# Patient Record
Sex: Male | Born: 1965 | Race: Black or African American | Hispanic: No | Smoking: Never smoker
Health system: Southern US, Community
[De-identification: ages and names within clinical notes are randomized; demographics above are authoritative.]

## PROBLEM LIST (undated history)

## (undated) DIAGNOSIS — M109 Gout, unspecified: Secondary | ICD-10-CM

## (undated) DIAGNOSIS — M51369 Other intervertebral disc degeneration, lumbar region without mention of lumbar back pain or lower extremity pain: Secondary | ICD-10-CM

## (undated) DIAGNOSIS — M5136 Other intervertebral disc degeneration, lumbar region: Secondary | ICD-10-CM

## (undated) DIAGNOSIS — D75A Glucose-6-phosphate dehydrogenase (G6PD) deficiency without anemia: Secondary | ICD-10-CM

## (undated) DIAGNOSIS — I10 Essential (primary) hypertension: Secondary | ICD-10-CM

## (undated) DIAGNOSIS — Z862 Personal history of diseases of the blood and blood-forming organs and certain disorders involving the immune mechanism: Secondary | ICD-10-CM

## (undated) HISTORY — PX: MENISCUS REPAIR: SHX5179

## (undated) HISTORY — PX: TENDON REPAIR: SHX5111

---
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
## 2015-04-02 ENCOUNTER — Other Ambulatory Visit: Payer: Self-pay | Admitting: Sports Medicine

## 2015-04-02 DIAGNOSIS — M1990 Unspecified osteoarthritis, unspecified site: Secondary | ICD-10-CM

## 2015-04-02 DIAGNOSIS — M545 Low back pain: Secondary | ICD-10-CM

## 2015-04-15 ENCOUNTER — Ambulatory Visit
Admission: RE | Admit: 2015-04-15 | Discharge: 2015-04-15 | Disposition: A | Discharge status: Home / Self Care | Source: Ambulatory Visit | Attending: Sports Medicine | Admitting: Sports Medicine

## 2015-04-15 DIAGNOSIS — M545 Low back pain: Secondary | ICD-10-CM

## 2015-04-15 DIAGNOSIS — M1990 Unspecified osteoarthritis, unspecified site: Secondary | ICD-10-CM

## 2018-03-22 ENCOUNTER — Emergency Department (HOSPITAL_COMMUNITY)

## 2018-03-22 ENCOUNTER — Encounter (HOSPITAL_COMMUNITY): Payer: Self-pay | Admitting: *Deleted

## 2018-03-22 ENCOUNTER — Inpatient Hospital Stay (HOSPITAL_COMMUNITY)

## 2018-03-22 ENCOUNTER — Inpatient Hospital Stay (HOSPITAL_COMMUNITY)
Admission: EM | Admit: 2018-03-22 | Discharge: 2018-04-11 | DRG: 028 | Disposition: A | Discharge status: Rehabilitation Facility | Attending: Neurosurgery | Admitting: Neurosurgery

## 2018-03-22 DIAGNOSIS — R58 Hemorrhage, not elsewhere classified: Secondary | ICD-10-CM

## 2018-03-22 DIAGNOSIS — G959 Disease of spinal cord, unspecified: Secondary | ICD-10-CM | POA: Diagnosis not present

## 2018-03-22 DIAGNOSIS — L8990 Pressure ulcer of unspecified site, unspecified stage: Secondary | ICD-10-CM

## 2018-03-22 DIAGNOSIS — D696 Thrombocytopenia, unspecified: Secondary | ICD-10-CM | POA: Diagnosis not present

## 2018-03-22 DIAGNOSIS — Z7901 Long term (current) use of anticoagulants: Secondary | ICD-10-CM | POA: Diagnosis not present

## 2018-03-22 DIAGNOSIS — I10 Essential (primary) hypertension: Secondary | ICD-10-CM | POA: Diagnosis present

## 2018-03-22 DIAGNOSIS — Y839 Surgical procedure, unspecified as the cause of abnormal reaction of the patient, or of later complication, without mention of misadventure at the time of the procedure: Secondary | ICD-10-CM | POA: Diagnosis not present

## 2018-03-22 DIAGNOSIS — L89159 Pressure ulcer of sacral region, unspecified stage: Secondary | ICD-10-CM | POA: Diagnosis not present

## 2018-03-22 DIAGNOSIS — R0789 Other chest pain: Secondary | ICD-10-CM

## 2018-03-22 DIAGNOSIS — I82409 Acute embolism and thrombosis of unspecified deep veins of unspecified lower extremity: Secondary | ICD-10-CM | POA: Diagnosis not present

## 2018-03-22 DIAGNOSIS — G8191 Hemiplegia, unspecified affecting right dominant side: Secondary | ICD-10-CM | POA: Diagnosis present

## 2018-03-22 DIAGNOSIS — T45515A Adverse effect of anticoagulants, initial encounter: Secondary | ICD-10-CM | POA: Diagnosis not present

## 2018-03-22 DIAGNOSIS — I824Y3 Acute embolism and thrombosis of unspecified deep veins of proximal lower extremity, bilateral: Secondary | ICD-10-CM | POA: Diagnosis not present

## 2018-03-22 DIAGNOSIS — Z886 Allergy status to analgesic agent status: Secondary | ICD-10-CM

## 2018-03-22 DIAGNOSIS — I2601 Septic pulmonary embolism with acute cor pulmonale: Secondary | ICD-10-CM | POA: Diagnosis not present

## 2018-03-22 DIAGNOSIS — E46 Unspecified protein-calorie malnutrition: Secondary | ICD-10-CM | POA: Diagnosis not present

## 2018-03-22 DIAGNOSIS — I824Z3 Acute embolism and thrombosis of unspecified deep veins of distal lower extremity, bilateral: Secondary | ICD-10-CM | POA: Diagnosis not present

## 2018-03-22 DIAGNOSIS — Z419 Encounter for procedure for purposes other than remedying health state, unspecified: Secondary | ICD-10-CM

## 2018-03-22 DIAGNOSIS — M488X2 Other specified spondylopathies, cervical region: Secondary | ICD-10-CM | POA: Diagnosis present

## 2018-03-22 DIAGNOSIS — M4802 Spinal stenosis, cervical region: Secondary | ICD-10-CM | POA: Diagnosis present

## 2018-03-22 DIAGNOSIS — Z01810 Encounter for preprocedural cardiovascular examination: Secondary | ICD-10-CM

## 2018-03-22 DIAGNOSIS — M79609 Pain in unspecified limb: Secondary | ICD-10-CM | POA: Diagnosis not present

## 2018-03-22 DIAGNOSIS — M5412 Radiculopathy, cervical region: Secondary | ICD-10-CM

## 2018-03-22 DIAGNOSIS — I82403 Acute embolism and thrombosis of unspecified deep veins of lower extremity, bilateral: Secondary | ICD-10-CM | POA: Diagnosis not present

## 2018-03-22 DIAGNOSIS — R0902 Hypoxemia: Secondary | ICD-10-CM | POA: Diagnosis present

## 2018-03-22 DIAGNOSIS — M5136 Other intervertebral disc degeneration, lumbar region: Secondary | ICD-10-CM | POA: Diagnosis present

## 2018-03-22 DIAGNOSIS — R531 Weakness: Secondary | ICD-10-CM | POA: Diagnosis present

## 2018-03-22 DIAGNOSIS — G8389 Other specified paralytic syndromes: Secondary | ICD-10-CM | POA: Diagnosis not present

## 2018-03-22 DIAGNOSIS — M7989 Other specified soft tissue disorders: Secondary | ICD-10-CM | POA: Diagnosis not present

## 2018-03-22 DIAGNOSIS — W19XXXA Unspecified fall, initial encounter: Secondary | ICD-10-CM | POA: Diagnosis present

## 2018-03-22 DIAGNOSIS — R195 Other fecal abnormalities: Secondary | ICD-10-CM | POA: Diagnosis not present

## 2018-03-22 DIAGNOSIS — K5900 Constipation, unspecified: Secondary | ICD-10-CM | POA: Diagnosis present

## 2018-03-22 DIAGNOSIS — M5416 Radiculopathy, lumbar region: Secondary | ICD-10-CM

## 2018-03-22 DIAGNOSIS — E561 Deficiency of vitamin K: Secondary | ICD-10-CM

## 2018-03-22 DIAGNOSIS — I82443 Acute embolism and thrombosis of tibial vein, bilateral: Secondary | ICD-10-CM | POA: Diagnosis not present

## 2018-03-22 DIAGNOSIS — M25561 Pain in right knee: Secondary | ICD-10-CM | POA: Diagnosis not present

## 2018-03-22 DIAGNOSIS — G8929 Other chronic pain: Secondary | ICD-10-CM | POA: Diagnosis present

## 2018-03-22 DIAGNOSIS — K592 Neurogenic bowel, not elsewhere classified: Secondary | ICD-10-CM | POA: Diagnosis not present

## 2018-03-22 DIAGNOSIS — I2699 Other pulmonary embolism without acute cor pulmonale: Secondary | ICD-10-CM

## 2018-03-22 DIAGNOSIS — F410 Panic disorder [episodic paroxysmal anxiety] without agoraphobia: Secondary | ICD-10-CM | POA: Diagnosis not present

## 2018-03-22 DIAGNOSIS — Z882 Allergy status to sulfonamides status: Secondary | ICD-10-CM

## 2018-03-22 DIAGNOSIS — G9761 Postprocedural hematoma of a nervous system organ or structure following a nervous system procedure: Secondary | ICD-10-CM | POA: Diagnosis not present

## 2018-03-22 DIAGNOSIS — M10072 Idiopathic gout, left ankle and foot: Secondary | ICD-10-CM | POA: Diagnosis not present

## 2018-03-22 DIAGNOSIS — Z6841 Body Mass Index (BMI) 40.0 and over, adult: Secondary | ICD-10-CM | POA: Diagnosis not present

## 2018-03-22 DIAGNOSIS — I2602 Saddle embolus of pulmonary artery with acute cor pulmonale: Secondary | ICD-10-CM | POA: Diagnosis not present

## 2018-03-22 DIAGNOSIS — R0602 Shortness of breath: Secondary | ICD-10-CM

## 2018-03-22 DIAGNOSIS — F4310 Post-traumatic stress disorder, unspecified: Secondary | ICD-10-CM | POA: Diagnosis not present

## 2018-03-22 DIAGNOSIS — I82401 Acute embolism and thrombosis of unspecified deep veins of right lower extremity: Secondary | ICD-10-CM | POA: Diagnosis not present

## 2018-03-22 DIAGNOSIS — R601 Generalized edema: Secondary | ICD-10-CM | POA: Diagnosis not present

## 2018-03-22 DIAGNOSIS — M1711 Unilateral primary osteoarthritis, right knee: Secondary | ICD-10-CM | POA: Diagnosis not present

## 2018-03-22 DIAGNOSIS — I9789 Other postprocedural complications and disorders of the circulatory system, not elsewhere classified: Secondary | ICD-10-CM | POA: Diagnosis not present

## 2018-03-22 DIAGNOSIS — G479 Sleep disorder, unspecified: Secondary | ICD-10-CM | POA: Diagnosis not present

## 2018-03-22 DIAGNOSIS — L7622 Postprocedural hemorrhage and hematoma of skin and subcutaneous tissue following other procedure: Secondary | ICD-10-CM | POA: Diagnosis not present

## 2018-03-22 DIAGNOSIS — S14123A Central cord syndrome at C3 level of cervical spinal cord, initial encounter: Secondary | ICD-10-CM | POA: Diagnosis present

## 2018-03-22 DIAGNOSIS — D62 Acute posthemorrhagic anemia: Secondary | ICD-10-CM | POA: Diagnosis not present

## 2018-03-22 HISTORY — DX: Other intervertebral disc degeneration, lumbar region without mention of lumbar back pain or lower extremity pain: M51.369

## 2018-03-22 HISTORY — DX: Gout, unspecified: M10.9

## 2018-03-22 HISTORY — DX: Personal history of diseases of the blood and blood-forming organs and certain disorders involving the immune mechanism: Z86.2

## 2018-03-22 HISTORY — DX: Other intervertebral disc degeneration, lumbar region: M51.36

## 2018-03-22 HISTORY — DX: Essential (primary) hypertension: I10

## 2018-03-22 LAB — BASIC METABOLIC PANEL
Anion gap: 11 (ref 5–15)
BUN: 10 mg/dL (ref 6–20)
CHLORIDE: 106 mmol/L (ref 98–111)
CO2: 24 mmol/L (ref 22–32)
Calcium: 9.2 mg/dL (ref 8.9–10.3)
Creatinine, Ser: 1.08 mg/dL (ref 0.61–1.24)
GFR calc Af Amer: >60 mL/min (ref >60)
GFR calc non Af Amer: >60 mL/min (ref >60)
GLUCOSE: 84 mg/dL (ref 70–99)
POTASSIUM: 3.7 mmol/L (ref 3.5–5.1)
Sodium: 141 mmol/L (ref 135–145)

## 2018-03-22 LAB — CBC
HCT: 43.9 % (ref 39.0–52.0)
Hemoglobin: 13.8 g/dL (ref 13.0–17.0)
MCH: 28.6 pg (ref 26.0–34.0)
MCHC: 31.4 g/dL (ref 30.0–36.0)
MCV: 91.1 fL (ref 78.0–100.0)
PLATELETS: 173 10*3/uL (ref 150–400)
RBC: 4.82 MIL/uL (ref 4.22–5.81)
RDW: 12.4 % (ref 11.5–15.5)
WBC: 6.4 10*3/uL (ref 4.0–10.5)

## 2018-03-22 MED ORDER — BACLOFEN 10 MG PO TABS
10.0000 mg | ORAL_TABLET | Freq: Two times a day (BID) | ORAL | Status: DC
  Administered 2018-03-22 – 2018-04-11 (×37): 10 mg via ORAL
  Filled 2018-03-22 (×39): qty 1

## 2018-03-22 MED ORDER — LORAZEPAM 2 MG/ML IJ SOLN
1.0000 mg | Freq: Once | INTRAMUSCULAR | Status: AC
  Administered 2018-03-22: 1 mg via INTRAVENOUS
  Filled 2018-03-22: qty 1

## 2018-03-22 MED ORDER — SODIUM CHLORIDE 0.9 % IV SOLN
Freq: Once | INTRAVENOUS | Status: AC
  Administered 2018-03-22: 20:00:00 via INTRAVENOUS

## 2018-03-22 MED ORDER — HYDROMORPHONE HCL 1 MG/ML IJ SOLN: 1.0000 mg | Freq: Once | INTRAMUSCULAR | Status: DC

## 2018-03-22 MED ORDER — DEXAMETHASONE SODIUM PHOSPHATE 10 MG/ML IJ SOLN
10.0000 mg | Freq: Once | INTRAMUSCULAR | Status: AC
  Administered 2018-03-22: 10 mg via INTRAVENOUS
  Filled 2018-03-22: qty 1

## 2018-03-22 NOTE — ED Notes (Signed)
Remains in MRI 

## 2018-03-22 NOTE — ED Notes (Signed)
PT in MRI.  Received call that pt needs Ativan.  Huntley DecSara, RN going to MRI for IV and Ativan administration.

## 2018-03-22 NOTE — ED Provider Notes (Signed)
[facility] EMERGENCY DEPARTMENT Provider Note   CSN: [id number] Arrival date & time: 03/22/18  1100     History   Chief Complaint Chief Complaint  Patient presents with  . Fall    HPI [name] [name] is a 52 y.o. male.  HPI Patient presents with weakness in his right upper extremity and weakness in his right lower leg.  States he has had chronic degenerative disease in his back.  Is been going for years and is seen Dr. Eulah Pont previously.  States he has had some chronic weakness in the right leg but recently is gotten worse.  Has been worse over the last couple weeks.  States he had a fall today where he could not get up.  Also for the last couple weeks he has had some weakness in his right arm.  States initially it was some paresthesias but now he has more difficulty moving his right hand.  States he fell today when his knee gave out and he was having difficulty getting up.  States he had to crawl.  States he bumped his front of his head.  States now when he bends his head over he gets tingling down both of his legs.  No neck pain. Past Medical History:  Diagnosis Date  . Degenerative disc disease, lumbar   . Gout   . Hypertension     There are no active problems to display for this patient.   Past Surgical History:  Procedure Laterality Date  . MENISCUS REPAIR Left   . TENDON REPAIR     tricept tendon        Home Medications    Prior to Admission medications   Medication Sig Start Date End Date Taking? Authorizing Provider  naproxen sodium (ALEVE) 220 MG tablet Take 220 mg by mouth 2 (two) times daily as needed (pain).   Yes [provider]    Family History No family history on file.  Social History Social History   Tobacco Use  . Smoking status: Never Smoker  . Smokeless tobacco: Never Used  Substance Use Topics  . Alcohol use: Never    Frequency: Never  . Drug use: Never     Allergies   Nsaids and Sulfa  antibiotics   Review of Systems Review of Systems  Constitutional: Negative for appetite change.  HENT: Negative for congestion.   Respiratory: Negative for shortness of breath.   Cardiovascular: Negative for chest pain and palpitations.  Gastrointestinal: Negative for abdominal pain.  Genitourinary: Negative for flank pain.  Musculoskeletal: Positive for back pain. Negative for neck pain.  Skin: Negative for rash.  Neurological: Positive for weakness and numbness.  Hematological: Negative for adenopathy.     Physical Exam Updated Vital Signs BP (!) 142/92   Pulse (!) 58   Temp 98.3 F (36.8 C) (Oral)   Resp 18   Wt (!) 138.3 kg (305 lb)   SpO2 97%   Physical Exam  Constitutional: He appears well-developed.  HENT:  Head: Atraumatic.  Eyes: EOM are normal.  Neck: Neck supple.  Cardiovascular: Normal rate.  Pulmonary/Chest: Effort normal.  Abdominal: Soft.  Musculoskeletal: He exhibits no tenderness.  Neurological: He is alert.  Patient with decreased strength and movement in his right hand.  Mild paresthesias.  Decreased dorsiflexion of right foot.  Decreased straight leg raise on the right side.  No real lumbar tenderness.  Skin: Skin is warm.  Psychiatric: He has a normal mood and affect.  ED Treatments / Results  Labs (all labs ordered are listed, but only abnormal results are displayed) Labs Reviewed  CBC  BASIC METABOLIC PANEL    EKG None  Radiology Ct Head Wo Contrast  Result Date: 03/22/2018 CLINICAL DATA:  Right-sided numbness and tingling, recent fall EXAM: CT HEAD WITHOUT CONTRAST TECHNIQUE: Contiguous axial images were obtained from the base of the skull through the vertex without intravenous contrast. COMPARISON:  None. FINDINGS: Brain: The ventricular system is prominent, disproportionate to the level of cortical atrophy. Therefore, normal pressure hydrocephalus would be a consideration if appropriate clinically. The fourth ventricle and  basilar cisterns are unremarkable. No hemorrhage, mass lesion, or acute infarction is seen. Vascular: No vascular abnormality is noted on this unenhanced study. Skull: On bone window images, no calvarial abnormality is seen. Sinuses/Orbits: There is retention cyst and possibly small amount of fluid in the right maxillary sinus consistent with right maxillary sinus disease. Mild mucosal thickening is present medially in the left maxillary sinus. The remainder of paranasal sinuses are well pneumatized. Other: None. IMPRESSION: 1. Ventriculomegaly, disproportionate to the degree of atrophy. Consider the possibility of normal pressure hydrocephalus. 2. No acute intracranial abnormality. 3. Right maxillary sinus disease. Electronically Signed   By: Dwyane DeePaul  Barry M.D.   On: 03/22/2018 14:34    Procedures Procedures (including critical care time)  Medications Ordered in ED Medications  LORazepam (ATIVAN) injection 1 mg (has no administration in time range)     Initial Impression / Assessment and Plan / ED Course  I have reviewed the triage vital signs and the nursing notes.  Pertinent labs & imaging results that were available during my care of the patient were reviewed by me and considered in my medical decision making (see chart for details).     Patient with right leg weakness and back pain.  When he bends his head forward he gets paresthesias down both his legs.  Also has new weakness in the right upper extremity.  Nonspecific CT with possible mild hydrocephalus.  Will get MRI of cervical and lumbar spine. Care turned over to Dr Erma HeritageIsaacs.  Final Clinical Impressions(s) / ED Diagnoses   Final diagnoses:  Lumbar radiculopathy  Cervical radiculopathy    ED Discharge Orders    None       Benjiman CorePickering, Roye Gustafson, MD 03/22/18 1704

## 2018-03-22 NOTE — ED Notes (Signed)
Pt in MRI. Delay in bloodwork

## 2018-03-22 NOTE — ED Provider Notes (Addendum)
Assumed care from Dr. Rubin PayorPickering at 4:08 PM. Briefly, the patient is a 52 y.o. male with PMHx of  has a past medical history of Degenerative disc disease, lumbar, Gout, and Hypertension. here with RUE weakness, RLE weakness, neck pain..   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  Patient started on IV Decadron and neurosurgery paged.  On my exam, he has some weakness with elbow extension, grip strength, and wrist extension on the right. -Discussed with Dr. Wynetta Emeryram, will start IV Decadron.  Patient placed in an Aspen collar.  Spine precautions.   CRITICAL CARE Performed by: Dollene Clevelandameron Isascs   Total critical care time: 35 minutes  Critical care time was exclusive of separately billable procedures and treating other patients.  Critical care was necessary to treat or prevent imminent or life-threatening deterioration.  Critical care was time spent personally by me on the following activities: development of treatment plan with patient and/or surrogate as well as nursing, discussions with consultants, evaluation of patient's response to treatment, examination of patient, obtaining history from patient or surrogate, ordering and performing treatments and interventions, ordering and review of laboratory studies, ordering and review of radiographic studies, pulse oximetry and re-evaluation of patient's condition.    Shaune PollackIsaacs, Kahle Mcqueen, MD 03/22/18 Prentice [id number]    Shaune PollackIsaacs, Jaliah Foody, MD 03/22/18 1919

## 2018-03-22 NOTE — ED Notes (Signed)
Remains in MRI.  Wife and son in room.  Updated on plan of care.

## 2018-03-22 NOTE — ED Triage Notes (Signed)
Pt states that his right knee "gave out" and he was unable to get up off the floor.  Pt with hx of DDD which causes right sided numbness and tingling (since 2016).  Pt reports that he fell on Monday.  Pt is alert and oriented.  He reports that he did not injure himself when his knee gave out, he reports that he "slowly went down" but could not get himself.  Pt was able to ambulate with EMS once he was helped up.  Pt has a skinned left elbow from the carpet when he was trying to get himself up. Pt denies any previous issues with right knee, denies any pain in knee, he states that it feels 'weak"

## 2018-03-22 NOTE — ED Notes (Signed)
Patient transported to MRI 

## 2018-03-22 NOTE — H&P (Signed)
[name] [name] is an 52 y.o. male.   Chief Complaint: numbness, weakness HPI:  52 year old gentleman with long-standing history with his low back all by orthopedics with chronic low back pain undergone injections and manage that recently well. He's had several weeks of balance difficulty and numbness in his hand primarily on the right also he's had episodic Dragging his right leg is also been going on for several weeks. He had a fall on Monday where he fell and struck his head and neck and since then his next appearance a little bit increased numbness in his right upper extremity and also his right leg. He had another fall on a wet surface today and had some difficulty with lower Sherry strength pulling them up and getting him back in his feet; EMS was brought to the ER. Overall the patient doesn't feel like he is any weaker in his right side and bent last few weeks he doesn't reports little bit increased numbness. He's had some chronic problems with constipation scuttlebutt worse over last few weeks he also reports a history that may be very chronic with possible urinary retention doubly has frequency and urgency. He is a long-standing chronic neck pain and he definite gives a positive history of Lhermitte's whenever he extends his neck he gets shocking feeling down his back and legs.  Past Medical History:  Diagnosis Date  . Degenerative disc disease, lumbar   . Gout   . Hypertension     Past Surgical History:  Procedure Laterality Date  . MENISCUS REPAIR Left   . TENDON REPAIR     tricept tendon    No family history on file. Social History:  reports that he has never smoked. He has never used smokeless tobacco. He reports that he does not drink alcohol or use drugs.  Allergies:  Allergies  Allergen Reactions  . Nsaids Other (See Comments)    G6PD  . Sulfa Antibiotics Other (See Comments)    G6PD     (Not in a hospital admission)  No results found for this or any previous visit  (from the past 48 hour(s)). Ct Head Wo Contrast  Result Date: 03/22/2018 CLINICAL DATA:  Right-sided numbness and tingling, recent fall EXAM: CT HEAD WITHOUT CONTRAST TECHNIQUE: Contiguous axial images were obtained from the base of the skull through the vertex without intravenous contrast. COMPARISON:  None. FINDINGS: Brain: The ventricular system is prominent, disproportionate to the level of cortical atrophy. Therefore, normal pressure hydrocephalus would be a consideration if appropriate clinically. The fourth ventricle and basilar cisterns are unremarkable. No hemorrhage, mass lesion, or acute infarction is seen. Vascular: No vascular abnormality is noted on this unenhanced study. Skull: On bone window images, no calvarial abnormality is seen. Sinuses/Orbits: There is retention cyst and possibly small amount of fluid in the right maxillary sinus consistent with right maxillary sinus disease. Mild mucosal thickening is present medially in the left maxillary sinus. The remainder of paranasal sinuses are well pneumatized. Other: None. IMPRESSION: 1. Ventriculomegaly, disproportionate to the degree of atrophy. Consider the possibility of normal pressure hydrocephalus. 2. No acute intracranial abnormality. 3. Right maxillary sinus disease. Electronically Signed   By: Dwyane Dee M.D.   On: 03/22/2018 14:34   Mr Cervical Spine Wo Contrast  Result Date: 03/22/2018 CLINICAL DATA:  Initial evaluation for acute right upper and lower extremity weakness, fall. EXAM: MRI CERVICAL AND LUMBAR SPINE WITHOUT CONTRAST TECHNIQUE: Multiplanar and multiecho pulse sequences of the cervical spine, to include the craniocervical junction and  cervicothoracic junction, and lumbar spine, were obtained without intravenous contrast. COMPARISON:  None. FINDINGS: MRI CERVICAL SPINE FINDINGS Alignment: Straightening with slight reversal of the normal cervical lordosis. No listhesis. Vertebrae: Vertebral body heights are maintained  without evidence for acute or chronic fracture. No definite fracture seen within the cervical spine status post recent trauma. Bone marrow signal intensity diffusely decreased on T1 weighted imaging, suspected to be related to body habitus. No discrete or worrisome osseous lesions. Mild reactive edema about the C3-4 interspace favored to be degenerative in nature. Cord: Linear T1/T2 hypointensity within the left ventral epidural space extending from C3 through C5 favored to reflect ossification of the posterior longitudinal ligament. Resultant severe spinal stenosis at C3-4 through C5-6. Abnormal T2 signal within the cervical spinal cord extending from C3 through C6 consistent with cord contusion/edema. Changes more pronounced at the right aspect of the cord. Possible small focus of associated hemorrhage at the level of C5-6 (series 6, image 8). Posterior Fossa, vertebral arteries, paraspinal tissues: Visualized brain and posterior fossa within normal limits. Craniocervical junction normal. Paraspinous and prevertebral soft tissues within normal limits. No findings to suggest acute ligamentous injury. Normal intravascular flow voids present within the vertebral arteries bilaterally. Disc levels: Underlying congenital spinal stenosis. C2-C3: Left paracentral disc protrusion indents the ventral thecal sac, flattening the left hemi cord. No cord signal changes. Moderate left-sided spinal stenosis. Neural foramina remain patent. C3-C4: Left paracentral osseous spur/ossification of the posterior longitudinal ligament. Underlying mild diffuse disc bulge with bilateral uncovertebral hypertrophy. Resultant severe spinal stenosis with spinal cord compression, most notable on the left. Thecal sac measures 3 mm in AP diameter at its most narrow point. Abnormal cord edema. Mild right with moderate left C4 foraminal stenosis. C4-C5: Left paracentral osseous spurs/ossification of the posterior longitudinal ligament. Underlying  mild diffuse disc bulge with uncovertebral hypertrophy. Severe spinal stenosis with compression of the cervical spinal cord. Abnormal cord signal intensity. Moderate left C5 foraminal stenosis. C5-C6: Diffuse disc bulge with bilateral uncovertebral hypertrophy. Bulging disc slightly asymmetric to the left. Resultant mild spinal stenosis with bilateral foraminal narrowing. C6-C7: Mild disc bulge with uncovertebral hypertrophy. No significant stenosis. C7-T1:  Unremarkable. Visualized upper thoracic spine demonstrates no significant finding. MRI LUMBAR SPINE FINDINGS Segmentation: Normal segmentation. Lowest well-formed disc labeled the L5-S1 level. Alignment: Mild scoliosis. Vertebral bodies otherwise normally aligned with preservation of the normal lumbar lordosis. No listhesis. Vertebrae: Vertebral body heights maintained without evidence for acute or chronic fracture. Underlying bone marrow signal intensity diffusely decreased on T1 weighted imaging, suspected to be related to body habitus. No discrete or worrisome osseous lesions. Reactive endplate changes with associated Schmorl's nodes present about the L4-5 and L5-S1 interspaces. Conus medullaris and cauda equina: Conus extends to the L2 level. Conus and cauda equina appear normal. Paraspinal and other soft tissues: Paraspinous soft tissues demonstrate no acute finding. Visualized visceral structures grossly unremarkable. Disc levels: Mild diffuse congenital shortening of the pedicles. L1-2:  Unremarkable. L2-3: Mild diffuse disc bulge. Superimposed shallow right foraminal disc protrusion contacts the exiting right L2 nerve root. Mild facet ligament flavum hypertrophy. No significant spinal stenosis. Mild right neural foraminal narrowing. L3-4: Mild diffuse disc bulge with intervertebral disc space narrowing and disc desiccation. Mild to moderate facet ligament flavum hypertrophy. Short pedicles with resultant mild spinal stenosis. Mild bilateral L3 foraminal  narrowing. L4-5: Mild diffuse disc bulge with intervertebral disc space narrowing. Chronic reactive endplate changes with marginal endplate spurring. Mild facet ligament flavum hypertrophy. Mild epidural lipomatosis. Short pedicles with  resultant mild canal and bilateral L4 foraminal narrowing. L5-S1: Broad posterior disc bulge with intervertebral disc space narrowing and disc desiccation. Chronic reactive endplate changes. Moderate right with mild left facet hypertrophy. Epidural lipomatosis. No significant stenosis. 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 Abnormal cord signal extending from C3 through C6 consistent with cord edema/contusion. Neuro surgical consultation recommended. MRI LUMBAR SPINE IMPRESSION 1. No acute abnormality within is the lumbar spine. 2. Shallow right foraminal disc protrusion at L2-3, contacting and potentially irritating the exiting right L2 nerve root. 3. 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 4. Prominent right-sided facet hypertrophy at L5-S1, which could contribute to lower back pain. Findings communicated to Dr. Erma Heritage at 6:47 p.m. on 03/22/2018. Electronically Signed   By: Rise Mu M.D.   On: 03/22/2018 18:51   Mr Lumbar Spine Wo Contrast  Result Date: 03/22/2018 CLINICAL DATA:  Initial evaluation for acute right upper and lower extremity weakness, fall. EXAM: MRI CERVICAL AND LUMBAR SPINE WITHOUT CONTRAST TECHNIQUE: Multiplanar and multiecho pulse sequences of the cervical spine, to include the craniocervical junction and cervicothoracic junction, and lumbar spine, were obtained without intravenous contrast. COMPARISON:  None. FINDINGS: MRI CERVICAL SPINE FINDINGS Alignment: Straightening with slight reversal of the normal cervical lordosis. No listhesis. Vertebrae: Vertebral body heights are maintained  without evidence for acute or chronic fracture. No definite fracture seen within the cervical spine status post recent trauma. Bone marrow signal intensity diffusely decreased on T1 weighted imaging, suspected to be related to body habitus. No discrete or worrisome osseous lesions. Mild reactive edema about the C3-4 interspace favored to be degenerative in nature. Cord: Linear T1/T2 hypointensity within the left ventral epidural space extending from C3 through C5 favored to reflect ossification of the posterior longitudinal ligament. Resultant severe spinal stenosis at C3-4 through C5-6. Abnormal T2 signal within the cervical spinal cord extending from C3 through C6 consistent with cord contusion/edema. Changes more pronounced at the right aspect of the cord. Possible small focus of associated hemorrhage at the level of C5-6 (series 6, image 8). Posterior Fossa, vertebral arteries, paraspinal tissues: Visualized brain and posterior fossa within normal limits. Craniocervical junction normal. Paraspinous and prevertebral soft tissues within normal limits. No findings to suggest acute ligamentous injury. Normal intravascular flow voids present within the vertebral arteries bilaterally. Disc levels: Underlying congenital spinal stenosis. C2-C3: Left paracentral disc protrusion indents the ventral thecal sac, flattening the left hemi cord. No cord signal changes. Moderate left-sided spinal stenosis. Neural foramina remain patent. C3-C4: Left paracentral osseous spur/ossification of the posterior longitudinal ligament. Underlying mild diffuse disc bulge with bilateral uncovertebral hypertrophy. Resultant severe spinal stenosis with spinal cord compression, most notable on the left. Thecal sac measures 3 mm in AP diameter at its most narrow point. Abnormal cord edema. Mild right with moderate left C4 foraminal stenosis. C4-C5: Left paracentral osseous spurs/ossification of the posterior longitudinal ligament. Underlying  mild diffuse disc bulge with uncovertebral hypertrophy. Severe spinal stenosis with compression of the cervical spinal cord. Abnormal cord signal intensity. Moderate left C5 foraminal stenosis. C5-C6: Diffuse disc bulge with bilateral uncovertebral hypertrophy. Bulging disc slightly asymmetric to the left. Resultant mild spinal stenosis with bilateral foraminal narrowing. C6-C7: Mild disc bulge with uncovertebral hypertrophy. No significant stenosis. C7-T1:  Unremarkable. Visualized upper thoracic spine demonstrates no significant finding. MRI LUMBAR SPINE FINDINGS Segmentation: Normal segmentation. Lowest well-formed disc labeled the L5-S1 level. Alignment: Mild  scoliosis. Vertebral bodies otherwise normally aligned with preservation of the normal lumbar lordosis. No listhesis. Vertebrae: Vertebral body heights maintained without evidence for acute or chronic fracture. Underlying bone marrow signal intensity diffusely decreased on T1 weighted imaging, suspected to be related to body habitus. No discrete or worrisome osseous lesions. Reactive endplate changes with associated Schmorl's nodes present about the L4-5 and L5-S1 interspaces. Conus medullaris and cauda equina: Conus extends to the L2 level. Conus and cauda equina appear normal. Paraspinal and other soft tissues: Paraspinous soft tissues demonstrate no acute finding. Visualized visceral structures grossly unremarkable. Disc levels: Mild diffuse congenital shortening of the pedicles. L1-2:  Unremarkable. L2-3: Mild diffuse disc bulge. Superimposed shallow right foraminal disc protrusion contacts the exiting right L2 nerve root. Mild facet ligament flavum hypertrophy. No significant spinal stenosis. Mild right neural foraminal narrowing. L3-4: Mild diffuse disc bulge with intervertebral disc space narrowing and disc desiccation. Mild to moderate facet ligament flavum hypertrophy. Short pedicles with resultant mild spinal stenosis. Mild bilateral L3 foraminal  narrowing. L4-5: Mild diffuse disc bulge with intervertebral disc space narrowing. Chronic reactive endplate changes with marginal endplate spurring. Mild facet ligament flavum hypertrophy. Mild epidural lipomatosis. Short pedicles with resultant mild canal and bilateral L4 foraminal narrowing. L5-S1: Broad posterior disc bulge with intervertebral disc space narrowing and disc desiccation. Chronic reactive endplate changes. Moderate right with mild left facet hypertrophy. Epidural lipomatosis. No significant stenosis. 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 Abnormal cord signal extending from C3 through C6 consistent with cord edema/contusion. Neuro surgical consultation recommended. MRI LUMBAR SPINE IMPRESSION 1. No acute abnormality within is the lumbar spine. 2. Shallow right foraminal disc protrusion at L2-3, contacting and potentially irritating the exiting right L2 nerve root. 3. 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 4. Prominent right-sided facet hypertrophy at L5-S1, which could contribute to lower back pain. Findings communicated to Dr. Erma Heritage at 6:47 p.m. on 03/22/2018. Electronically Signed   By: Rise Mu M.D.   On: 03/22/2018 18:51    Review of Systems  Musculoskeletal: Positive for back pain, falls, joint pain, myalgias and neck pain.  Neurological: Positive for tingling, sensory change and focal weakness.    Blood pressure (!) 151/85, pulse 69, temperature 98.3 F (36.8 C), temperature source Oral, resp. rate 18, weight (!) 138.3 kg (305 lb), SpO2 96 %. Physical Exam  Constitutional: He is oriented to person, place, and time. He appears well-developed and well-nourished.  HENT:  Head: Normocephalic and atraumatic.  Eyes: Pupils are equal, round, and reactive to light.  Neck: Normal range of motion.  Respiratory:  Effort normal.  GI: Soft. Bowel sounds are normal.  Musculoskeletal: Normal range of motion.  Neurological: He is alert and oriented to person, place, and time. GCS eye subscore is 4. GCS verbal subscore is 5. GCS motor subscore is 6.  Patient is awake alert resting complimented with a cervical collar in place. Has a positive Lhermitte's strength is 5 out of 5 in his left upper and lower extremity deltoid, bicep, tricep, wrist flexion, wrist extension, hand intrinsics and iliopsoas, quads, hamstrings, gastric, into tibialis, EHL. Right upper extremities. A 5 in his deltoid, bicep tricep wrist flexion extension and intrinsics are all 4+ out of 5 right lower extremities is 4+ out of 5 iliopsoas 5 out of 5 quads hamstrings gastrocs and EHL. Sensation decreased in left upper extremity seems to have a  sensory level around T12-L1 with decreased sensation right lower extremity but normal sensation in his lateral trunk .  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 I think there is chance he might be a little bit weaker in his right upper extremity that he had been however overall has been noticing this for few weeks he doesn't report that much change certainly numbness is slightly increased. I think that surgical decompression of spinal cord is going to require an anterior posterior approach in a stage fashion with at least a 1 level possibly a two-level corpectomy of C3 and C4 to decompress the spinal cord anteriorly due to the large calcified spurs causing severe distortion of the left side of the spinal cord. Interestingly the vast majority of her symptoms are right-sided. In addition I think require posterior  augmentation. I've ordered a CT scan of his neck to further identify and clarify how much this truly is OPLL versus disc disease or hematoma. Due to the significant signal change within his cord and cord edema I think it would be beneficial for him to have 36 hours of IV steroid and then we'll plan the anterior approach and decompression. Then a stage fashion bringing him back on Monday into the posterior probably combination of decompression fusion. I've extensively gone over this with the patient and they understand and agree with the plan.  Adalis Gatti P, MD 03/22/2018, 8:14 PM

## 2018-03-22 NOTE — ED Notes (Signed)
Called CT to see when exam can be completed for Dr. Wynetta Emeryram

## 2018-03-22 NOTE — ED Notes (Signed)
Consulting MD at bedside, Neurosurgery

## 2018-03-23 ENCOUNTER — Other Ambulatory Visit: Payer: Self-pay

## 2018-03-23 ENCOUNTER — Other Ambulatory Visit: Payer: Self-pay | Admitting: Neurosurgery

## 2018-03-23 LAB — MRSA PCR SCREENING: MRSA by PCR: NEGATIVE

## 2018-03-23 MED ORDER — ALUM & MAG HYDROXIDE-SIMETH 200-200-20 MG/5ML PO SUSP: 30.0000 mL | Freq: Four times a day (QID) | ORAL | Status: DC | PRN

## 2018-03-23 MED ORDER — PHENOL 1.4 % MT LIQD: 1.0000 | OROMUCOSAL | Status: DC | PRN

## 2018-03-23 MED ORDER — CHLORHEXIDINE GLUCONATE CLOTH 2 % EX PADS
6.0000 | MEDICATED_PAD | Freq: Once | CUTANEOUS | Status: AC
  Administered 2018-03-23: 6 via TOPICAL

## 2018-03-23 MED ORDER — CHLORHEXIDINE GLUCONATE CLOTH 2 % EX PADS: 6.0000 | MEDICATED_PAD | Freq: Once | CUTANEOUS | Status: DC

## 2018-03-23 MED ORDER — DEXAMETHASONE SODIUM PHOSPHATE 10 MG/ML IJ SOLN
10.0000 mg | Freq: Four times a day (QID) | INTRAMUSCULAR | Status: DC
  Administered 2018-03-23 – 2018-03-31 (×34): 10 mg via INTRAVENOUS
  Filled 2018-03-23 (×35): qty 1

## 2018-03-23 MED ORDER — CYCLOBENZAPRINE HCL 10 MG PO TABS
10.0000 mg | ORAL_TABLET | Freq: Three times a day (TID) | ORAL | Status: DC | PRN
  Administered 2018-03-24 – 2018-04-10 (×12): 10 mg via ORAL
  Filled 2018-03-23 (×14): qty 1

## 2018-03-23 MED ORDER — CEFAZOLIN SODIUM-DEXTROSE 2-4 GM/100ML-% IV SOLN
2.0000 g | INTRAVENOUS | Status: AC
  Administered 2018-03-24 (×2): 2 g via INTRAVENOUS
  Filled 2018-03-23: qty 100

## 2018-03-23 MED ORDER — ACETAMINOPHEN 650 MG RE SUPP: 650.0000 mg | RECTAL | Status: DC | PRN

## 2018-03-23 MED ORDER — HYDROMORPHONE HCL 1 MG/ML IJ SOLN: 0.5000 mg | INTRAMUSCULAR | Status: DC | PRN

## 2018-03-23 MED ORDER — SODIUM CHLORIDE 0.9 % IV SOLN: 250.0000 mL | INTRAVENOUS | Status: DC

## 2018-03-23 MED ORDER — SODIUM CHLORIDE 0.9% FLUSH
3.0000 mL | Freq: Two times a day (BID) | INTRAVENOUS | Status: DC
  Administered 2018-03-23 – 2018-03-27 (×7): 3 mL via INTRAVENOUS

## 2018-03-23 MED ORDER — ACETAMINOPHEN 325 MG PO TABS: 650.0000 mg | ORAL_TABLET | ORAL | Status: DC | PRN

## 2018-03-23 MED ORDER — CYCLOBENZAPRINE HCL 10 MG PO TABS: 10.0000 mg | ORAL_TABLET | Freq: Three times a day (TID) | ORAL | Status: DC | PRN

## 2018-03-23 MED ORDER — ONDANSETRON HCL 4 MG/2ML IJ SOLN: 4.0000 mg | Freq: Four times a day (QID) | INTRAMUSCULAR | Status: DC | PRN

## 2018-03-23 MED ORDER — OXYCODONE HCL 5 MG PO TABS
10.0000 mg | ORAL_TABLET | ORAL | Status: DC | PRN
  Administered 2018-03-23 (×2): 10 mg via ORAL
  Filled 2018-03-23 (×2): qty 2

## 2018-03-23 MED ORDER — PANTOPRAZOLE SODIUM 40 MG PO TBEC
40.0000 mg | DELAYED_RELEASE_TABLET | Freq: Every day | ORAL | Status: DC
  Administered 2018-03-23 – 2018-04-11 (×18): 40 mg via ORAL
  Filled 2018-03-23 (×19): qty 1

## 2018-03-23 MED ORDER — MENTHOL 3 MG MT LOZG: 1.0000 | LOZENGE | OROMUCOSAL | Status: DC | PRN

## 2018-03-23 MED ORDER — ONDANSETRON HCL 4 MG PO TABS: 4.0000 mg | ORAL_TABLET | Freq: Four times a day (QID) | ORAL | Status: DC | PRN

## 2018-03-23 MED ORDER — DEXAMETHASONE SODIUM PHOSPHATE 10 MG/ML IJ SOLN
10.0000 mg | INTRAMUSCULAR | Status: AC
  Administered 2018-03-24: 10 mg via INTRAVENOUS

## 2018-03-23 MED ORDER — SODIUM CHLORIDE 0.9% FLUSH: 3.0000 mL | INTRAVENOUS | Status: DC | PRN

## 2018-03-23 NOTE — Progress Notes (Signed)
Subjective: Patient reports Patient doing much better this morning on steroids significant improvement in right upper extremity strength and numbness.  Objective: Vital signs in last 24 hours: Temp:  [97.5 F (36.4 C)-99.6 F (37.6 C)] 97.5 F (36.4 C) (07/11 0441) Pulse Rate:  [56-85] 85 (07/11 0100) Resp:  [18] 18 (07/10 1324) BP: (124-160)/(79-101) 139/79 (07/11 0441) SpO2:  [95 %-99 %] 95 % (07/11 0441) Weight:  [138.3 kg (305 lb)-139.1 kg (306 lb 10.6 oz)] 139.1 kg (306 lb 10.6 oz) (07/11 0100)  Intake/Output from previous day: 07/10 0701 - 07/11 0700 In: -  Out: 175 [Urine:175] Intake/Output this shift: No intake/output data recorded.  4+ out of 5 triceps strength and intrinsics L4-L5 5 out of 5 right lower 70  Lab Results: Recent Labs    03/22/18 2120  WBC 6.4  HGB 13.8  HCT 43.9  PLT 173   BMET Recent Labs    03/22/18 2120  NA 141  K 3.7  CL 106  CO2 24  GLUCOSE 84  BUN 10  CREATININE 1.08  CALCIUM 9.2    Studies/Results: Ct Head Wo Contrast  Result Date: 03/22/2018 CLINICAL DATA:  Right-sided numbness and tingling, recent fall EXAM: CT HEAD WITHOUT CONTRAST TECHNIQUE: Contiguous axial images were obtained from the base of the skull through the vertex without intravenous contrast. COMPARISON:  None. FINDINGS: Brain: The ventricular system is prominent, disproportionate to the level of cortical atrophy. Therefore, normal pressure hydrocephalus would be a consideration if appropriate clinically. The fourth ventricle and basilar cisterns are unremarkable. No hemorrhage, mass lesion, or acute infarction is seen. Vascular: No vascular abnormality is noted on this unenhanced study. Skull: On bone window images, no calvarial abnormality is seen. Sinuses/Orbits: There is retention cyst and possibly small amount of fluid in the right maxillary sinus consistent with right maxillary sinus disease. Mild mucosal thickening is present medially in the left maxillary sinus.  The remainder of paranasal sinuses are well pneumatized. Other: None. IMPRESSION: 1. Ventriculomegaly, disproportionate to the degree of atrophy. Consider the possibility of normal pressure hydrocephalus. 2. No acute intracranial abnormality. 3. Right maxillary sinus disease. Electronically Signed   By: Dwyane Dee M.D.   On: 03/22/2018 14:34   Ct Cervical Spine Wo Contrast  Result Date: 03/22/2018 CLINICAL DATA:  Right-sided numbness and tingling since 2016. Patient fell on Monday. Myelopathy. EXAM: CT CERVICAL SPINE WITHOUT CONTRAST TECHNIQUE: Multidetector CT imaging of the cervical spine was performed without intravenous contrast. Multiplanar CT image reconstructions were also generated. COMPARISON:  Same day MRI FINDINGS: Alignment: Slight reversal cervical lordosis. Intact craniocervical relationship and atlantodental interval. No listhesis. Skull base and vertebrae: The vertebral body heights are maintained without evidence of acute fracture. No suspicious osseous lesions. Soft tissues and spinal canal: C3 through C6 ossification of the posterior longitudinal ligament most prominent posterior to C3 and C4 markedly the narrowing the lumen of the central canal. This is most evident posterior to C3-4 where there is left central ossification occupying up to 75% of the AP dimension on the left. Disc levels: C2-C3: Left paracentral disc protrusion impressing upon the ventral aspect of the thecal sac. Moderate left-sided spinal stenosis. Patent neural foramina. C3-C4: Moderate disc flattening with left paracentral ossification of the posterior longitudinal ligament impressing upon the thecal sac and cord on the left up to 50% canal stenosis due to the ossified longitudinal ligament on the left. Bilateral uncovertebral joint hypertrophy with uncinate spurring. Mild right and moderate left foraminal stenosis. C4-C5: Prominent left broad based  central ossified posterior longitudinal ligament with underlying diffuse  disc bulge and uncovertebral joint osteoarthritis. Severe spinal stenosis up to 50% narrowing the AP dimension. Moderate left L5 foraminal stenosis. C5-C6: Central to left central disc herniation with partially calcified disc impressing upon the thecal sac. Resultant mild spinal stenosis. Slight bilateral foraminal encroachment from uncinate spurring. C6-C7: Negative without significant disc herniation identified. Shoulder artifacts limit assessment. C7-T1: Negative given limitations of streak artifacts from the patient's shoulders. Upper chest: Negative. 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 No acute cervical spine fracture. Electronically Signed   By: Tollie Eth M.D.   On: 03/22/2018 21:41   Mr Cervical Spine Wo Contrast  Result Date: 03/22/2018 CLINICAL DATA:  Initial evaluation for acute right upper and lower extremity weakness, fall. EXAM: MRI CERVICAL AND LUMBAR SPINE WITHOUT CONTRAST TECHNIQUE: Multiplanar and multiecho pulse sequences of the cervical spine, to include the craniocervical junction and cervicothoracic junction, and lumbar spine, were obtained without intravenous contrast. COMPARISON:  None. FINDINGS: MRI CERVICAL SPINE FINDINGS Alignment: Straightening with slight reversal of the normal cervical lordosis. No listhesis. Vertebrae: Vertebral body heights are maintained without evidence for acute or chronic fracture. No definite fracture seen within the cervical spine status post recent trauma. Bone marrow signal intensity diffusely decreased on T1 weighted imaging, suspected to be related to body habitus. No discrete or worrisome osseous lesions. Mild reactive edema about the C3-4 interspace favored to be degenerative in nature. Cord: Linear T1/T2 hypointensity within the left ventral epidural space extending from C3 through C5 favored to reflect ossification of the posterior  longitudinal ligament. Resultant severe spinal stenosis at C3-4 through C5-6. Abnormal T2 signal within the cervical spinal cord extending from C3 through C6 consistent with cord contusion/edema. Changes more pronounced at the right aspect of the cord. Possible small focus of associated hemorrhage at the level of C5-6 (series 6, image 8). Posterior Fossa, vertebral arteries, paraspinal tissues: Visualized brain and posterior fossa within normal limits. Craniocervical junction normal. Paraspinous and prevertebral soft tissues within normal limits. No findings to suggest acute ligamentous injury. Normal intravascular flow voids present within the vertebral arteries bilaterally. Disc levels: Underlying congenital spinal stenosis. C2-C3: Left paracentral disc protrusion indents the ventral thecal sac, flattening the left hemi cord. No cord signal changes. Moderate left-sided spinal stenosis. Neural foramina remain patent. C3-C4: Left paracentral osseous spur/ossification of the posterior longitudinal ligament. Underlying mild diffuse disc bulge with bilateral uncovertebral hypertrophy. Resultant severe spinal stenosis with spinal cord compression, most notable on the left. Thecal sac measures 3 mm in AP diameter at its most narrow point. Abnormal cord edema. Mild right with moderate left C4 foraminal stenosis. C4-C5: Left paracentral osseous spurs/ossification of the posterior longitudinal ligament. Underlying mild diffuse disc bulge with uncovertebral hypertrophy. Severe spinal stenosis with compression of the cervical spinal cord. Abnormal cord signal intensity. Moderate left C5 foraminal stenosis. C5-C6: Diffuse disc bulge with bilateral uncovertebral hypertrophy. Bulging disc slightly asymmetric to the left. Resultant mild spinal stenosis with bilateral foraminal narrowing. C6-C7: Mild disc bulge with uncovertebral hypertrophy. No significant stenosis. C7-T1:  Unremarkable. Visualized upper thoracic spine  demonstrates no significant finding. MRI LUMBAR SPINE FINDINGS Segmentation: Normal segmentation. Lowest well-formed disc labeled the L5-S1 level. Alignment: Mild scoliosis. Vertebral bodies otherwise normally aligned with preservation of the normal lumbar lordosis. No listhesis. Vertebrae: Vertebral body heights maintained without evidence for acute or chronic fracture. Underlying bone marrow signal intensity diffusely decreased on T1 weighted  imaging, suspected to be related to body habitus. No discrete or worrisome osseous lesions. Reactive endplate changes with associated Schmorl's nodes present about the L4-5 and L5-S1 interspaces. Conus medullaris and cauda equina: Conus extends to the L2 level. Conus and cauda equina appear normal. Paraspinal and other soft tissues: Paraspinous soft tissues demonstrate no acute finding. Visualized visceral structures grossly unremarkable. Disc levels: Mild diffuse congenital shortening of the pedicles. L1-2:  Unremarkable. L2-3: Mild diffuse disc bulge. Superimposed shallow right foraminal disc protrusion contacts the exiting right L2 nerve root. Mild facet ligament flavum hypertrophy. No significant spinal stenosis. Mild right neural foraminal narrowing. L3-4: Mild diffuse disc bulge with intervertebral disc space narrowing and disc desiccation. Mild to moderate facet ligament flavum hypertrophy. Short pedicles with resultant mild spinal stenosis. Mild bilateral L3 foraminal narrowing. L4-5: Mild diffuse disc bulge with intervertebral disc space narrowing. Chronic reactive endplate changes with marginal endplate spurring. Mild facet ligament flavum hypertrophy. Mild epidural lipomatosis. Short pedicles with resultant mild canal and bilateral L4 foraminal narrowing. L5-S1: Broad posterior disc bulge with intervertebral disc space narrowing and disc desiccation. Chronic reactive endplate changes. Moderate right with mild left facet hypertrophy. Epidural lipomatosis. No  significant stenosis. 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 Abnormal cord signal extending from C3 through C6 consistent with cord edema/contusion. Neuro surgical consultation recommended. MRI LUMBAR SPINE IMPRESSION 1. No acute abnormality within is the lumbar spine. 2. Shallow right foraminal disc protrusion at L2-3, contacting and potentially irritating the exiting right L2 nerve root. 3. 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 4. Prominent right-sided facet hypertrophy at L5-S1, which could contribute to lower back pain. Findings communicated to Dr. Erma Heritage at 6:47 p.m. on 03/22/2018. Electronically Signed   By: Rise Mu M.D.   On: 03/22/2018 18:51   Mr Lumbar Spine Wo Contrast  Result Date: 03/22/2018 CLINICAL DATA:  Initial evaluation for acute right upper and lower extremity weakness, fall. EXAM: MRI CERVICAL AND LUMBAR SPINE WITHOUT CONTRAST TECHNIQUE: Multiplanar and multiecho pulse sequences of the cervical spine, to include the craniocervical junction and cervicothoracic junction, and lumbar spine, were obtained without intravenous contrast. COMPARISON:  None. FINDINGS: MRI CERVICAL SPINE FINDINGS Alignment: Straightening with slight reversal of the normal cervical lordosis. No listhesis. Vertebrae: Vertebral body heights are maintained without evidence for acute or chronic fracture. No definite fracture seen within the cervical spine status post recent trauma. Bone marrow signal intensity diffusely decreased on T1 weighted imaging, suspected to be related to body habitus. No discrete or worrisome osseous lesions. Mild reactive edema about the C3-4 interspace favored to be degenerative in nature. Cord: Linear T1/T2 hypointensity within the left ventral epidural space extending from C3 through C5 favored to reflect ossification of  the posterior longitudinal ligament. Resultant severe spinal stenosis at C3-4 through C5-6. Abnormal T2 signal within the cervical spinal cord extending from C3 through C6 consistent with cord contusion/edema. Changes more pronounced at the right aspect of the cord. Possible small focus of associated hemorrhage at the level of C5-6 (series 6, image 8). Posterior Fossa, vertebral arteries, paraspinal tissues: Visualized brain and posterior fossa within normal limits. Craniocervical junction normal. Paraspinous and prevertebral soft tissues within normal limits. No findings to suggest acute ligamentous injury. Normal intravascular flow voids present within the vertebral arteries bilaterally. Disc levels: Underlying congenital spinal stenosis. C2-C3: Left paracentral disc protrusion indents the ventral thecal sac, flattening the left  hemi cord. No cord signal changes. Moderate left-sided spinal stenosis. Neural foramina remain patent. C3-C4: Left paracentral osseous spur/ossification of the posterior longitudinal ligament. Underlying mild diffuse disc bulge with bilateral uncovertebral hypertrophy. Resultant severe spinal stenosis with spinal cord compression, most notable on the left. Thecal sac measures 3 mm in AP diameter at its most narrow point. Abnormal cord edema. Mild right with moderate left C4 foraminal stenosis. C4-C5: Left paracentral osseous spurs/ossification of the posterior longitudinal ligament. Underlying mild diffuse disc bulge with uncovertebral hypertrophy. Severe spinal stenosis with compression of the cervical spinal cord. Abnormal cord signal intensity. Moderate left C5 foraminal stenosis. C5-C6: Diffuse disc bulge with bilateral uncovertebral hypertrophy. Bulging disc slightly asymmetric to the left. Resultant mild spinal stenosis with bilateral foraminal narrowing. C6-C7: Mild disc bulge with uncovertebral hypertrophy. No significant stenosis. C7-T1:  Unremarkable. Visualized upper thoracic  spine demonstrates no significant finding. MRI LUMBAR SPINE FINDINGS Segmentation: Normal segmentation. Lowest well-formed disc labeled the L5-S1 level. Alignment: Mild scoliosis. Vertebral bodies otherwise normally aligned with preservation of the normal lumbar lordosis. No listhesis. Vertebrae: Vertebral body heights maintained without evidence for acute or chronic fracture. Underlying bone marrow signal intensity diffusely decreased on T1 weighted imaging, suspected to be related to body habitus. No discrete or worrisome osseous lesions. Reactive endplate changes with associated Schmorl's nodes present about the L4-5 and L5-S1 interspaces. Conus medullaris and cauda equina: Conus extends to the L2 level. Conus and cauda equina appear normal. Paraspinal and other soft tissues: Paraspinous soft tissues demonstrate no acute finding. Visualized visceral structures grossly unremarkable. Disc levels: Mild diffuse congenital shortening of the pedicles. L1-2:  Unremarkable. L2-3: Mild diffuse disc bulge. Superimposed shallow right foraminal disc protrusion contacts the exiting right L2 nerve root. Mild facet ligament flavum hypertrophy. No significant spinal stenosis. Mild right neural foraminal narrowing. L3-4: Mild diffuse disc bulge with intervertebral disc space narrowing and disc desiccation. Mild to moderate facet ligament flavum hypertrophy. Short pedicles with resultant mild spinal stenosis. Mild bilateral L3 foraminal narrowing. L4-5: Mild diffuse disc bulge with intervertebral disc space narrowing. Chronic reactive endplate changes with marginal endplate spurring. Mild facet ligament flavum hypertrophy. Mild epidural lipomatosis. Short pedicles with resultant mild canal and bilateral L4 foraminal narrowing. L5-S1: Broad posterior disc bulge with intervertebral disc space narrowing and disc desiccation. Chronic reactive endplate changes. Moderate right with mild left facet hypertrophy. Epidural lipomatosis. No  significant stenosis. 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 Abnormal cord signal extending from C3 through C6 consistent with cord edema/contusion. Neuro surgical consultation recommended. MRI LUMBAR SPINE IMPRESSION 1. No acute abnormality within is the lumbar spine. 2. Shallow right foraminal disc protrusion at L2-3, contacting and potentially irritating the exiting right L2 nerve root. 3. 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 4. Prominent right-sided facet hypertrophy at L5-S1, which could contribute to lower back pain. Findings communicated to Dr. Erma HeritageIsaacs at 6:47 p.m. on 03/22/2018. Electronically Signed   By: Rise MuBenjamin  McClintock M.D.   On: 03/22/2018 18:51    Assessment/Plan: 52 year old gentleman hospital day 1 central cord syndrome. Plan on anterior cervical corpectomy tomorrow with stage II posterior cervical decompression augmentation on Monday. Mobilized today with physical and occupational therapy  LOS: 1 day     Izzie Geers P 03/23/2018, 7:41 AM

## 2018-03-23 NOTE — Progress Notes (Signed)
Inpatient Rehabilitation  Per PT request, patient was screened by Fae PippinMelissa Danyal Adorno for appropriateness for an Inpatient Acute Rehab consult.  At this time we are planning to follow along for post op therapy recommendations prior to requesting an IP Rehab consult.  Call if questions.  Charlane FerrettiMelissa Shamaine Mulkern, M.A., CCC/SLP Admission Coordinator  [facility]  Cell [phone]

## 2018-03-23 NOTE — Progress Notes (Signed)
Patient admitted from ED with a fall at home. Alert and oriented to room and surroundings. Denies pain. Skin assessment done with two nurses.

## 2018-03-23 NOTE — Plan of Care (Signed)
  Problem: Education: Goal: Knowledge of General Education information will improve Outcome: Progressing   Problem: Health Behavior/Discharge Planning: Goal: Ability to manage health-related needs will improve Outcome: Progressing   Problem: Activity: Goal: Risk for activity intolerance will decrease Outcome: Progressing   

## 2018-03-23 NOTE — Evaluation (Addendum)
Physical Therapy Evaluation Patient Details Name: [name] [name] MRN: [id number] DOB: [birth year] Today's Date: 03/23/2018   History of Present Illness  Patient is a 52 y/o male with PMH of HTN, DDD lumbar spine and gout.  He was admitted after a fall at home with inability to get up.  Reports one other recent fall when his dog pulled him over.  States recent R hand numbness,tingling and imbalance.  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  Awating surgical decompression tomorrw, right now recieving steriods for symptoms.   Clinical Impression  Patient presents with decreased independence with mobility due to LE weakness and numbness and will benefit from skilled PT in the acute setting to allow maximized mobility prior to d/c to CIR level rehab.  He has both UE and LE symptoms and likely will need interdisciplinary skilled rehab following surgical intervention.  Currently mod A overall for bed mobility, transfers and gait due to UE/LE weakness and impaired sensation.     Follow Up Recommendations CIR;Supervision/Assistance - 24 hour    Equipment Recommendations  Rolling walker with 5" wheels;3in1 (PT)    Recommendations for Other Services       Precautions / Restrictions Precautions Precautions: Fall Required Braces or Orthoses: Cervical Brace Cervical Brace: Hard collar      Mobility  Bed Mobility Overal bed mobility: Needs Assistance Bed Mobility: Rolling;Sidelying to Sit Rolling: Supervision Sidelying to sit: Mod assist       General bed mobility comments: increased time for lifting trunk due to UE weakness; assist and cues  Transfers Overall transfer level: Needs assistance Equipment used: Rolling walker (2 wheeled);None Transfers: Sit to/from Stand Sit to Stand: Min assist;Mod assist         General transfer comment: assist for balance and some lifting help, initially no device  Ambulation/Gait Ambulation/Gait assistance: Mod assist;Min assist Gait  Distance (Feet): 90 Feet Assistive device: Rolling walker (2 wheeled) Gait Pattern/deviations: Step-to pattern;Step-through pattern;Decreased stride length;Narrow base of support;Scissoring;Shuffle;Decreased dorsiflexion - left;Decreased dorsiflexion - right     General Gait Details: cues for increased BOS, at times scissoring initially and some LOB with turning in hallway with feet crossing, mod A for recovery, notes both feet dragging at times   Stairs            Wheelchair Mobility    Modified Rankin (Stroke Patients Only)       Balance Overall balance assessment: Needs assistance Sitting-balance support: Feet supported Sitting balance-Leahy Scale: Good     Standing balance support: No upper extremity supported Standing balance-Leahy Scale: Fair Standing balance comment: initially static standing without UE support                             Pertinent Vitals/Pain Pain Assessment: No/denies pain    Home Living Family/patient expects to be discharged to:: Private residence Living Arrangements: Parent;Alone(was living alone in extended stay hotel, can stay with parents after d/c) Available Help at Discharge: Family Type of Home: House Home Access: Stairs to enter Entrance Stairs-Rails: Right Entrance Stairs-Number of Steps: 5 in front, 3 in garage Home Layout: One level Home Equipment: None      Prior Function Level of Independence: Independent         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[employer], was CNA     Hand Dominance        Extremity/Trunk Assessment   Upper Extremity Assessment Upper Extremity Assessment: Defer to OT evaluation  Lower Extremity Assessment Lower Extremity Assessment: RLE deficits/detail;LLE deficits/detail RLE Deficits / Details: AAROM WFL, strength hip flexion 2+/4, knee extension 4-/5, ankle DF 4+/5; sensation decreased to temp and light touch throughout  RLE Sensation: decreased light touch LLE Deficits / Details:  AROM WFL, strength hip flexion 4-/5, knee extension 5/5, ankle DF 5/5; sensation decreased to light touch, but feels temp LLE Sensation: decreased light touch       Communication   Communication: No difficulties  Cognition Arousal/Alertness: Awake/alert Behavior During Therapy: WFL for tasks assessed/performed Overall Cognitive Status: Within Functional Limits for tasks assessed                                        General Comments      Exercises     Assessment/Plan    PT Assessment Patient needs continued PT services  PT Problem List Decreased strength;Decreased mobility;Decreased safety awareness;Decreased balance;Decreased knowledge of use of DME;Impaired sensation;Decreased coordination       PT Treatment Interventions DME instruction;Therapeutic activities;Gait training;Therapeutic exercise;Patient/family education;Balance training;Functional mobility training    PT Goals (Current goals can be found in the Care Plan section)  Acute Rehab PT Goals Patient Stated Goal: to return to independent PT Goal Formulation: With patient Time For Goal Achievement: 04/06/18 Potential to Achieve Goals: Good    Frequency Min 4X/week   Barriers to discharge        Co-evaluation               AM-PAC PT "6 Clicks" Daily Activity  Outcome Measure Difficulty turning over in bed (including adjusting bedclothes, sheets and blankets)?: A Little Difficulty moving from lying on back to sitting on the side of the bed? : Unable Difficulty sitting down on and standing up from a chair with arms (e.g., wheelchair, bedside commode, etc,.)?: Unable Help needed moving to and from a bed to chair (including a wheelchair)?: A Little Help needed walking in hospital room?: A Lot Help needed climbing 3-5 steps with a railing? : Total 6 Click Score: 11    End of Session Equipment Utilized During Treatment: Gait belt Activity Tolerance: Patient tolerated treatment  well Patient left: with call bell/phone within reach;in chair Nurse Communication: Mobility status PT Visit Diagnosis: Other abnormalities of gait and mobility (R26.89);Muscle weakness (generalized) (M62.81);History of falling (Z91.81)    Time: [id number] PT Time Calculation (min) (ACUTE ONLY): 35 min   Charges:   PT Evaluation $PT Eval Moderate Complexity: 1 Mod PT Treatments $Gait Training: 8-22 mins   PT G CodesSheran Lawless:        Cyndi Laurajean Hosek, [place] CarolinaPT [phone] 03/23/2018   Elray Mcgregorynthia Argusta Mcgann 03/23/2018, 1:33 PM

## 2018-03-23 NOTE — Progress Notes (Signed)
Patient asked the nurse to remove the cervical collar/se educated patient on the importance of the collar.

## 2018-03-23 NOTE — Evaluation (Signed)
Occupational Therapy Evaluation Patient Details Name: [name] [name] Comp MRN: [id number] DOB: 1966-03-18 Today's Date: 03/23/2018    History of Present Illness Patient is a 52 y/o male with PMH of HTN, DDD lumbar spine and gout.  He was admitted after a fall at home with inability to get up.  Reports one other recent fall when his dog pulled him over.  States recent R hand numbness,tingling and imbalance.  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  Awating surgical decompression tomorrw, right now recieving steriods for symptoms.    Clinical Impression   [name] admitted with above presents to acute OT with decreased balance, decreased use of right hand, decreased sensation right hand all affecting his safety and independence with basic ADLs with pt being completely independent pta including working full time at [employer] [place]. He will benefit from acute OT with follow up OT on CIR.     Follow Up Recommendations  CIR;Supervision - Intermittent    Equipment Recommendations  3 in 1 bedside commode       Precautions / Restrictions Precautions Precautions: Fall Required Braces or Orthoses: Cervical Brace Cervical Brace: Hard collar;At all times Restrictions Weight Bearing Restrictions: No      Mobility Bed Mobility     General bed mobility comments: Pt up in recliner upon my arrival  Transfers Overall transfer level: Needs assistance Equipment used: Rolling walker (2 wheeled);None Transfers: Sit to/from Stand Sit to Stand: Min assist         General transfer comment: assist for balance and some lifting help    Balance Overall balance assessment: Needs assistance Sitting-balance support: Feet supported Sitting balance-Leahy Scale: Good     Standing balance support: Bilateral upper extremity supported Standing balance-Leahy Scale: Poor Standing balance comment: initially static standing without UE support                           ADL either  performed or assessed with clinical judgement   ADL Overall ADL's : Needs assistance/impaired Eating/Feeding: Independent;Sitting Eating/Feeding Details (indicate cue type and reason): with increased time, says he cannot cut up food, but can use spoon and fork with increased time--prefers finger food right now. Did issue pt a rocker knife Grooming: Set up;Sitting Grooming Details (indicate cue type and reason): with increased time Upper Body Bathing: Set up;Supervision/ safety;Sitting Upper Body Bathing Details (indicate cue type and reason): with increased time Lower Body Bathing: Minimal assistance Lower Body Bathing Details (indicate cue type and reason): min A sit<>stand Upper Body Dressing : Minimal assistance;Sitting Upper Body Dressing Details (indicate cue type and reason): with increased time Lower Body Dressing: Moderate assistance Lower Body Dressing Details (indicate cue type and reason): min A sit<>stand Toilet Transfer: Minimal assistance;Ambulation;RW   Toileting- Clothing Manipulation and Hygiene: Moderate assistance Toileting - Clothing Manipulation Details (indicate cue type and reason): min A sit<>stand             Vision Patient Visual Report: No change from baseline              Pertinent Vitals/Pain Pain Assessment: No/denies pain     Hand Dominance Right   Extremity/Trunk Assessment Upper Extremity Assessment Upper Extremity Assessment: RUE deficits/detail;LUE deficits/detail RUE Deficits / Details: shoulder to forearm WNL, wrist flexion weak as well as grip, can oppose all digits to thumb with increased time except for pinky, can abduct fingers but when goes to adduct wrist flexes RUE Sensation: decreased  light touch RUE Coordination: decreased fine motor LUE Deficits / Details: WNL LUE Sensation: WNL LUE Coordination: WNL       Communication Communication Communication: No difficulties   Cognition Arousal/Alertness: Awake/alert Behavior  During Therapy: WFL for tasks assessed/performed Overall Cognitive Status: Within Functional Limits for tasks assessed                                        Exercises Other Exercises Other Exercises: Issued pt red putty with instruction to work on composite grip, pinch and pull, and thumb presses        Home Living Family/patient expects to be discharged to:: Private residence Living Arrangements: Parent;Alone(was living alone at extended stay, says he can stay with parents at discharge) Available Help at Discharge: Family Type of Home: House Home Access: Stairs to enter [employer] of Steps: 5 in front, 3 in garage Entrance Stairs-Rails: Right Home Layout: One level     Bathroom Shower/Tub: Chief Strategy Officer: Standard     Home Equipment: None          Prior Functioning/Environment Level of Independence: Independent        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[employer], was CNA        OT Problem List: Decreased strength;Decreased range of motion;Impaired balance (sitting and/or standing);Impaired sensation;Impaired UE functional use;Decreased coordination      OT Treatment/Interventions: Self-care/ADL training;Balance training;Therapeutic exercise;DME and/or AE instruction;Patient/family education    OT Goals(Current goals can be found in the care plan section) Acute Rehab OT Goals Patient Stated Goal: to return to independent OT Goal Formulation: With patient Time For Goal Achievement: 04/06/18 Potential to Achieve Goals: Good  OT Frequency: Min 3X/week              AM-PAC PT "6 Clicks" Daily Activity     Outcome Measure Help from another person eating meals?: A Little(for cutting) Help from another person taking care of personal grooming?: A Little Help from another person toileting, which includes using toliet, bedpan, or urinal?: A Little Help from another person bathing (including washing, rinsing, drying)?: A  Little Help from another person to put on and taking off regular upper body clothing?: A Little Help from another person to put on and taking off regular lower body clothing?: A Lot 6 Click Score: 17   End of Session Equipment Utilized During Treatment: Gait belt;Rolling walker  Activity Tolerance: Patient tolerated treatment well Patient left: in chair;with call bell/phone within reach  OT Visit Diagnosis: Unsteadiness on feet (R26.81);Other abnormalities of gait and mobility (R26.89);Repeated falls (R29.6);Muscle weakness (generalized) (M62.81);History of falling (Z91.81);Hemiplegia and hemiparesis Hemiplegia - Right/Left: Right Hemiplegia - dominant/non-dominant: Dominant Hemiplegia - caused by: Unspecified                Time: 1610-9604 OT Time Calculation (min): 41 min Charges:  OT General Charges $OT Visit: 1 Visit OT Evaluation $OT Eval Moderate Complexity: 1 Mod OT Treatments $Self Care/Home Management : 8-22 mins $Therapeutic Exercise: 8-22 mins Ignacia Palma, OTR/L [phone] 03/23/2018

## 2018-03-24 ENCOUNTER — Inpatient Hospital Stay (HOSPITAL_COMMUNITY): Admitting: Critical Care Medicine

## 2018-03-24 ENCOUNTER — Inpatient Hospital Stay (HOSPITAL_COMMUNITY)

## 2018-03-24 ENCOUNTER — Encounter (HOSPITAL_COMMUNITY): Payer: Self-pay | Admitting: Critical Care Medicine

## 2018-03-24 ENCOUNTER — Inpatient Hospital Stay (HOSPITAL_COMMUNITY)
Admission: EM | Disposition: A | Discharge status: Rehabilitation Facility | Payer: Self-pay | Source: Home / Self Care | Attending: Neurosurgery

## 2018-03-24 HISTORY — PX: ANTERIOR CERVICAL CORPECTOMY: SHX1159

## 2018-03-24 SURGERY — ANTERIOR CERVICAL CORPECTOMY
Anesthesia: General

## 2018-03-24 MED ORDER — MIDAZOLAM HCL 5 MG/5ML IJ SOLN
INTRAMUSCULAR | Status: DC | PRN
  Administered 2018-03-24: 2 mg via INTRAVENOUS

## 2018-03-24 MED ORDER — SODIUM CHLORIDE 0.9% FLUSH
3.0000 mL | Freq: Two times a day (BID) | INTRAVENOUS | Status: DC
  Administered 2018-03-24 – 2018-03-27 (×4): 3 mL via INTRAVENOUS

## 2018-03-24 MED ORDER — PHENOL 1.4 % MT LIQD
1.0000 | OROMUCOSAL | Status: DC | PRN
  Administered 2018-03-26: 1 via OROMUCOSAL
  Filled 2018-03-24: qty 177

## 2018-03-24 MED ORDER — LACTATED RINGERS IV SOLN: INTRAVENOUS | Status: DC

## 2018-03-24 MED ORDER — OXYCODONE HCL 5 MG PO TABS
ORAL_TABLET | ORAL | Status: AC
  Filled 2018-03-24: qty 2

## 2018-03-24 MED ORDER — THROMBIN (RECOMBINANT) 20000 UNITS EX SOLR
CUTANEOUS | Status: AC
  Filled 2018-03-24: qty 20000

## 2018-03-24 MED ORDER — METOCLOPRAMIDE HCL 5 MG/ML IJ SOLN: 10.0000 mg | Freq: Once | INTRAMUSCULAR | Status: DC | PRN

## 2018-03-24 MED ORDER — ACETAMINOPHEN 650 MG RE SUPP: 650.0000 mg | RECTAL | Status: DC | PRN

## 2018-03-24 MED ORDER — CYCLOBENZAPRINE HCL 10 MG PO TABS: 10.0000 mg | ORAL_TABLET | Freq: Three times a day (TID) | ORAL | Status: DC | PRN

## 2018-03-24 MED ORDER — 0.9 % SODIUM CHLORIDE (POUR BTL) OPTIME
TOPICAL | Status: DC | PRN
  Administered 2018-03-24: 1000 mL

## 2018-03-24 MED ORDER — MENTHOL 3 MG MT LOZG
1.0000 | LOZENGE | OROMUCOSAL | Status: DC | PRN
  Administered 2018-03-27 (×2): 3 mg via ORAL
  Filled 2018-03-24: qty 9

## 2018-03-24 MED ORDER — MIDAZOLAM HCL 2 MG/2ML IJ SOLN
INTRAMUSCULAR | Status: AC
  Filled 2018-03-24: qty 2

## 2018-03-24 MED ORDER — SURGIFOAM 100 EX MISC
CUTANEOUS | Status: DC | PRN
  Administered 2018-03-24: 09:00:00 via TOPICAL

## 2018-03-24 MED ORDER — PANTOPRAZOLE SODIUM 40 MG PO TBEC: 40.0000 mg | DELAYED_RELEASE_TABLET | Freq: Every day | ORAL | Status: DC

## 2018-03-24 MED ORDER — SUGAMMADEX SODIUM 200 MG/2ML IV SOLN
INTRAVENOUS | Status: DC | PRN
  Administered 2018-03-24: 280 mg via INTRAVENOUS

## 2018-03-24 MED ORDER — ACETAMINOPHEN 325 MG PO TABS
650.0000 mg | ORAL_TABLET | ORAL | Status: DC | PRN
  Administered 2018-04-05: 650 mg via ORAL
  Filled 2018-03-24 (×2): qty 2

## 2018-03-24 MED ORDER — FENTANYL CITRATE (PF) 250 MCG/5ML IJ SOLN
INTRAMUSCULAR | Status: DC | PRN
  Administered 2018-03-24 (×8): 50 ug via INTRAVENOUS

## 2018-03-24 MED ORDER — HYDROMORPHONE HCL 1 MG/ML IJ SOLN
1.0000 mg | INTRAMUSCULAR | Status: DC | PRN
  Administered 2018-03-24 – 2018-03-28 (×4): 1 mg via INTRAVENOUS
  Filled 2018-03-24 (×4): qty 1

## 2018-03-24 MED ORDER — SODIUM CHLORIDE 0.9 % IV SOLN
INTRAVENOUS | Status: DC | PRN
  Administered 2018-03-24: 500 mL

## 2018-03-24 MED ORDER — LACTATED RINGERS IV SOLN
INTRAVENOUS | Status: DC | PRN
  Administered 2018-03-24 (×2): via INTRAVENOUS

## 2018-03-24 MED ORDER — LIDOCAINE 2% (20 MG/ML) 5 ML SYRINGE
INTRAMUSCULAR | Status: DC | PRN
  Administered 2018-03-24: 100 mg via INTRAVENOUS

## 2018-03-24 MED ORDER — PROPOFOL 10 MG/ML IV BOLUS
INTRAVENOUS | Status: DC | PRN
  Administered 2018-03-24: 200 mg via INTRAVENOUS

## 2018-03-24 MED ORDER — SODIUM CHLORIDE 0.9% FLUSH: 3.0000 mL | INTRAVENOUS | Status: DC | PRN

## 2018-03-24 MED ORDER — ONDANSETRON HCL 4 MG/2ML IJ SOLN
4.0000 mg | Freq: Four times a day (QID) | INTRAMUSCULAR | Status: DC | PRN
  Administered 2018-03-24 – 2018-03-26 (×2): 4 mg via INTRAVENOUS
  Filled 2018-03-24 (×3): qty 2

## 2018-03-24 MED ORDER — ALUM & MAG HYDROXIDE-SIMETH 200-200-20 MG/5ML PO SUSP
30.0000 mL | Freq: Four times a day (QID) | ORAL | Status: DC | PRN
  Administered 2018-03-28 – 2018-04-10 (×4): 30 mL via ORAL
  Filled 2018-03-24 (×4): qty 30

## 2018-03-24 MED ORDER — FENTANYL CITRATE (PF) 100 MCG/2ML IJ SOLN
INTRAMUSCULAR | Status: AC
  Filled 2018-03-24: qty 2

## 2018-03-24 MED ORDER — CEFAZOLIN SODIUM-DEXTROSE 2-4 GM/100ML-% IV SOLN
2.0000 g | Freq: Three times a day (TID) | INTRAVENOUS | Status: AC
  Administered 2018-03-24 – 2018-03-26 (×6): 2 g via INTRAVENOUS
  Filled 2018-03-24 (×6): qty 100

## 2018-03-24 MED ORDER — FENTANYL CITRATE (PF) 250 MCG/5ML IJ SOLN
INTRAMUSCULAR | Status: AC
Start: 2018-03-24 — End: ?
  Filled 2018-03-24: qty 5

## 2018-03-24 MED ORDER — HEMOSTATIC AGENTS (NO CHARGE) OPTIME
TOPICAL | Status: DC | PRN
  Administered 2018-03-24 (×2): 1

## 2018-03-24 MED ORDER — ONDANSETRON HCL 4 MG/2ML IJ SOLN
INTRAMUSCULAR | Status: DC | PRN
  Administered 2018-03-24: 4 mg via INTRAVENOUS

## 2018-03-24 MED ORDER — ONDANSETRON HCL 4 MG PO TABS
4.0000 mg | ORAL_TABLET | Freq: Four times a day (QID) | ORAL | Status: DC | PRN
  Administered 2018-04-04: 4 mg via ORAL
  Filled 2018-03-24 (×2): qty 1

## 2018-03-24 MED ORDER — ROCURONIUM BROMIDE 10 MG/ML (PF) SYRINGE
PREFILLED_SYRINGE | INTRAVENOUS | Status: DC | PRN
  Administered 2018-03-24: 20 mg via INTRAVENOUS
  Administered 2018-03-24: 80 mg via INTRAVENOUS

## 2018-03-24 MED ORDER — MEPERIDINE HCL 50 MG/ML IJ SOLN: 6.2500 mg | INTRAMUSCULAR | Status: DC | PRN

## 2018-03-24 MED ORDER — FENTANYL CITRATE (PF) 250 MCG/5ML IJ SOLN
INTRAMUSCULAR | Status: AC
  Filled 2018-03-24: qty 5

## 2018-03-24 MED ORDER — PROPOFOL 10 MG/ML IV BOLUS
INTRAVENOUS | Status: AC
  Filled 2018-03-24: qty 20

## 2018-03-24 MED ORDER — SODIUM CHLORIDE 0.9 % IV SOLN
250.0000 mL | INTRAVENOUS | Status: DC
  Administered 2018-03-25: 250 mL via INTRAVENOUS

## 2018-03-24 MED ORDER — OXYCODONE HCL 5 MG PO TABS
10.0000 mg | ORAL_TABLET | ORAL | Status: DC | PRN
  Administered 2018-03-24 – 2018-04-11 (×42): 10 mg via ORAL
  Filled 2018-03-24 (×43): qty 2

## 2018-03-24 MED ORDER — FENTANYL CITRATE (PF) 100 MCG/2ML IJ SOLN
25.0000 ug | INTRAMUSCULAR | Status: DC | PRN
  Administered 2018-03-24: 50 ug via INTRAVENOUS

## 2018-03-24 SURGICAL SUPPLY — 69 items
BAG DECANTER FOR FLEXI CONT (MISCELLANEOUS) ×2 IMPLANT
BASKET BONE COLLECTION (BASKET) ×2 IMPLANT
BENZOIN TINCTURE PRP APPL 2/3 (GAUZE/BANDAGES/DRESSINGS) ×2 IMPLANT
BIT DRILL 16XSTRL REUSE (BIT) ×1 IMPLANT
BIT DRILL NEURO 2X3.1 SFT TUCH (MISCELLANEOUS) ×1 IMPLANT
BIT DRL 16XSTRL REUSE (BIT) ×1
BUR MATCHSTICK NEURO 3.0 LAGG (BURR) ×2 IMPLANT
CANISTER SUCT 3000ML PPV (MISCELLANEOUS) ×2 IMPLANT
CARTRIDGE OIL MAESTRO DRILL (MISCELLANEOUS) ×1 IMPLANT
DECANTER SPIKE VIAL GLASS SM (MISCELLANEOUS) IMPLANT
DERMABOND ADVANCED (GAUZE/BANDAGES/DRESSINGS) ×1
DERMABOND ADVANCED .7 DNX12 (GAUZE/BANDAGES/DRESSINGS) ×1 IMPLANT
DIFFUSER DRILL AIR PNEUMATIC (MISCELLANEOUS) ×2 IMPLANT
DRAIN SNY WOU 7FLT (WOUND CARE) ×2 IMPLANT
DRAPE C-ARM 42X72 X-RAY (DRAPES) ×4 IMPLANT
DRAPE LAPAROTOMY 100X72 PEDS (DRAPES) ×2 IMPLANT
DRAPE MICROSCOPE LEICA (MISCELLANEOUS) ×2 IMPLANT
DRAPE TABLE COVER HEAVY DUTY (DRAPES) ×2 IMPLANT
DRILL BIT 16MM (BIT) ×1
DRILL NEURO 2X3.1 SOFT TOUCH (MISCELLANEOUS) ×2
DRSG OPSITE POSTOP 4X6 (GAUZE/BANDAGES/DRESSINGS) ×2 IMPLANT
DURAPREP 6ML APPLICATOR 50/CS (WOUND CARE) ×2 IMPLANT
ELECT COATED BLADE 2.86 ST (ELECTRODE) ×2 IMPLANT
ELECT REM PT RETURN 9FT ADLT (ELECTROSURGICAL) ×2
ELECTRODE REM PT RTRN 9FT ADLT (ELECTROSURGICAL) ×1 IMPLANT
ENDCAP COLOSSEUM 12X14X2 0D (Cap) ×4 IMPLANT
ENDCAP COLOSSEUM 12X14X3 0D (Cap) ×2 IMPLANT
EVACUATOR SILICONE 100CC (DRAIN) ×2 IMPLANT
FLOSEAL 5ML (HEMOSTASIS) ×2 IMPLANT
GAUZE SPONGE 4X4 12PLY STRL (GAUZE/BANDAGES/DRESSINGS) ×2 IMPLANT
GAUZE SPONGE 4X4 16PLY XRAY LF (GAUZE/BANDAGES/DRESSINGS) IMPLANT
GLOVE BIO SURGEON STRL SZ7 (GLOVE) ×4 IMPLANT
GLOVE BIO SURGEON STRL SZ8 (GLOVE) ×4 IMPLANT
GLOVE BIOGEL M 7.0 STRL (GLOVE) ×2 IMPLANT
GLOVE BIOGEL PI IND STRL 7.0 (GLOVE) ×2 IMPLANT
GLOVE BIOGEL PI IND STRL 7.5 (GLOVE) ×2 IMPLANT
GLOVE BIOGEL PI INDICATOR 7.0 (GLOVE) ×2
GLOVE BIOGEL PI INDICATOR 7.5 (GLOVE) ×2
GLOVE ECLIPSE 6.5 STRL STRAW (GLOVE) ×2 IMPLANT
GLOVE INDICATOR 8.5 STRL (GLOVE) ×4 IMPLANT
GLOVE SURG SS PI 7.0 STRL IVOR (GLOVE) ×4 IMPLANT
GOWN STRL REUS W/ TWL LRG LVL3 (GOWN DISPOSABLE) ×4 IMPLANT
GOWN STRL REUS W/ TWL XL LVL3 (GOWN DISPOSABLE) ×2 IMPLANT
GOWN STRL REUS W/TWL 2XL LVL3 (GOWN DISPOSABLE) IMPLANT
GOWN STRL REUS W/TWL LRG LVL3 (GOWN DISPOSABLE) ×4
GOWN STRL REUS W/TWL XL LVL3 (GOWN DISPOSABLE) ×2
HALTER HD/CHIN CERV TRACTION D (MISCELLANEOUS) ×2 IMPLANT
KIT BASIN OR (CUSTOM PROCEDURE TRAY) ×2 IMPLANT
KIT TURNOVER KIT B (KITS) ×2 IMPLANT
MESH COLOSSEUM 12X14X20 (Mesh General) ×2 IMPLANT
NEEDLE SPNL 20GX3.5 QUINCKE YW (NEEDLE) ×2 IMPLANT
NS IRRIG 1000ML POUR BTL (IV SOLUTION) ×2 IMPLANT
OIL CARTRIDGE MAESTRO DRILL (MISCELLANEOUS) ×2
PACK LAMINECTOMY NEURO (CUSTOM PROCEDURE TRAY) ×2 IMPLANT
PIN DISTRACTION 14MM (PIN) IMPLANT
PLATE ANT CERV XTEND 2 LV 32 (Plate) ×2 IMPLANT
PUTTY BONE DBX 2.5 MIS (Bone Implant) ×2 IMPLANT
RUBBERBAND STERILE (MISCELLANEOUS) ×4 IMPLANT
SCREW XTD VAR 4.2 SELF TAP 16 (Screw) ×8 IMPLANT
SPONGE INTESTINAL PEANUT (DISPOSABLE) ×2 IMPLANT
SPONGE SURGIFOAM ABS GEL 100 (HEMOSTASIS) ×2 IMPLANT
STRIP CLOSURE SKIN 1/2X4 (GAUZE/BANDAGES/DRESSINGS) ×2 IMPLANT
SUT SILK 0 TIES 10X30 (SUTURE) ×2 IMPLANT
SUT VIC AB 3-0 SH 8-18 (SUTURE) ×2 IMPLANT
SUT VICRYL 4-0 PS2 18IN ABS (SUTURE) ×2 IMPLANT
TAPE CLOTH 4X10 WHT NS (GAUZE/BANDAGES/DRESSINGS) IMPLANT
TOWEL GREEN STERILE (TOWEL DISPOSABLE) ×2 IMPLANT
TOWEL GREEN STERILE FF (TOWEL DISPOSABLE) ×2 IMPLANT
WATER STERILE IRR 1000ML POUR (IV SOLUTION) ×2 IMPLANT

## 2018-03-24 NOTE — Progress Notes (Signed)
Patient ID: [name] [name], male   DOB: 1966-03-05, 52 y.o.   MRN: [id number] Patient is ready for surgery and has no questions symptoms are still significantly improved with IV Decadron.  Strength 4-5 triceps on the right 4+ out of 5 biceps 4-5 and intrinsics right hand right lower extremity 4+ to 5 out of 5 all muscle groups.  I've extensively gone over the risks and benefits of an anterior cervical corpectomy and fusion with the patient as well as perioperative course expectations of outcome and alternatives of surgery including the second stage procedure to be performed on Monday. He understands all this and agrees to proceed forward.

## 2018-03-24 NOTE — Transfer of Care (Signed)
Immediate Anesthesia Transfer of Care Note  Patient: [name] [name]  Procedure(s) Performed: CERVICAL FOUR ANTERIOR CERVICAL CORPECTOMY (N/A )  Patient Location: PACU  Anesthesia Type:General  Level of Consciousness: awake, alert  and oriented  Airway & Oxygen Therapy: Patient Spontanous Breathing and Patient connected to nasal cannula oxygen  Post-op Assessment: Report given to RN and Post -op Vital signs reviewed and stable  Post vital signs: Reviewed and stable  Last Vitals:  Vitals Value Taken Time  BP 149/79 03/24/2018 12:00 PM  Temp    Pulse 74 03/24/2018 12:02 PM  Resp 7 03/24/2018 12:02 PM  SpO2 97 % 03/24/2018 12:02 PM  Vitals shown include unvalidated device data.  Last Pain:  Vitals:   03/24/18 0400  TempSrc: Oral  PainSc:       Patients Stated Pain Goal: 1 (03/23/18 0800)  Complications: No apparent anesthesia complications

## 2018-03-24 NOTE — Anesthesia Preprocedure Evaluation (Signed)
Anesthesia Evaluation  Patient identified by MRN, date of birth, ID band Patient awake    Reviewed: Allergy & Precautions, NPO status , Patient's Chart, lab work & pertinent test results  Airway Mallampati: II  TM Distance: >3 FB Neck ROM: Full    Dental no notable dental hx.    Pulmonary neg pulmonary ROS,    Pulmonary exam normal breath sounds clear to auscultation       Cardiovascular hypertension, negative cardio ROS Normal cardiovascular exam Rhythm:Regular Rate:Normal     Neuro/Psych negative neurological ROS  negative psych ROS   GI/Hepatic negative GI ROS, Neg liver ROS,   Endo/Other  Morbid obesity  Renal/GU negative Renal ROS  negative genitourinary   Musculoskeletal negative musculoskeletal ROS (+)   Abdominal   Peds negative pediatric ROS (+)  Hematology negative hematology ROS (+)   Anesthesia Other Findings   Reproductive/Obstetrics negative OB ROS                             Anesthesia Physical Anesthesia Plan  ASA: III  Anesthesia Plan: General   Post-op Pain Management:    Induction: Intravenous  PONV Risk Score and Plan: 2 and Ondansetron and Treatment may vary due to age or medical condition  Airway Management Planned: Oral ETT  Additional Equipment:   Intra-op Plan:   Post-operative Plan: Extubation in OR  Informed Consent: I have reviewed the patients History and Physical, chart, labs and discussed the procedure including the risks, benefits and alternatives for the proposed anesthesia with the patient or authorized representative who has indicated his/her understanding and acceptance.   Dental advisory given  Plan Discussed with: CRNA  Anesthesia Plan Comments:         Anesthesia Quick Evaluation

## 2018-03-24 NOTE — Anesthesia Procedure Notes (Signed)
Procedure Name: Intubation Date/Time: 03/24/2018 7:39 AM Performed by: Rachel MouldsLee, Myrene Bougher B, CRNA Pre-anesthesia Checklist: Patient identified, Emergency Drugs available, Suction available, Patient being monitored and Timeout performed Patient Re-evaluated:Patient Re-evaluated prior to induction Oxygen Delivery Method: Circle system utilized Preoxygenation: Pre-oxygenation with 100% oxygen Induction Type: IV induction Ventilation: Mask ventilation without difficulty and Oral airway inserted - appropriate to patient size Laryngoscope Size: Glidescope Grade View: Grade I Tube type: Oral Tube size: 7.5 mm Number of attempts: 1 Airway Equipment and Method: Stylet Placement Confirmation: ETT inserted through vocal cords under direct vision,  positive ETCO2,  CO2 detector and breath sounds checked- equal and bilateral Secured at: 22 cm Tube secured with: Tape Dental Injury: Teeth and Oropharynx as per pre-operative assessment  Comments: Elective glidescope due to central cord syndrome

## 2018-03-24 NOTE — Op Note (Signed)
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  Postoperative diagnosis: Same  Procedure: Anterior cervical corpectomy of C4 utilizing the globus titanium cage system and globus extend plating system and locally harvested autograft mixed with DBX mix with microscopic dissection and foraminotomies of the C4 and C5 nerve roots  Surgeon: Jillyn HiddenGary Korissa Horsford  Asst.: Coletta MemosKyle Cabbell  Anesthesia: Gen.  EBL: Minimal  History of present illness:52 year old gentleman who presented to the emergency room with right upper extremity numbness tingling and weakness following a fall workup revealed severe cord compression signal change within his cord and OPLL extending from C3-4 down the C4-5 with the largest component being behind the body of C4. Due to patient progressive conical syndrome imaging findings have recommended a corpectomy of C4 discectomies at L3-4 and 45 with under biting of the 3 vertebral body I extensively went over the risks and benefits of the operation with him as well as perioperative course expectations of outcome and alternatives to surgery and he understood and agreed to proceed forward.  Operative procedure patient brought into the or was induced under general anesthesia positioned supine the neck in neutral position the rest of his neck was prepped and draped in routine sterile fashion preoperative localizing the correct level so a curvilinear incision was made just off midline to the interbody the sternomastoid and the superficial platysmas dissected and divided longitudinally the avascular plane to sternomastoid and strap muscles was dissected down to the prevertebral  Fascia facet with Kitners. Interoperative x-ray confirmed identification appropriate levelso annulotomy was made at C4-5 to marked the disc space longus Richardson Doppole as reflected laterally and self-retaining retractors were placed. Caspar pins were  placed in the vertebral bodies of C3 and C5. Then I performed discectomies at C3-4 and C4-5 under microscopic illumination I removed the anterior part of the vertebral body with Leksell rongeur after identified the epidural space at the C4-5 disc space and marched up superiorly drilling down the vertebral body of C4 thinning it out and then using a 1 and 2 Kerrison punch to create a trough. I marched up the right side of the spinal canal where there was no significant calcification in addition I marched out laterally on the left side lateral to the large calcified ligament. Eggshell the ligament and removed it with minimal manipulation thecal sac or spinal cord. Marching superiorly I drilled the undersurface in the posterior aspect of the C3 vertebral body and aggressively under bit the left superior aspect of the undersurface of the C3 vertebral body. I confirmed with fluoroscopy with a nerve hook that I had essentially under bitten up until the halfway point of the C3 vertebral body. Marking laterally all pedicles were palpated all foramina were inspected to confirm no more compression thecal sac and spinal cord look widely decompressed. I then prepared the endplates and with sequential sizing techniques and some modifications of the cage I selected a 20 mm cage initially put 3 mm endplates on but then switched out to a 3 and a 2 to create a 25 mm construct packed with the locally harvested autograft mixed with little bit of DBX mix. And inserted this with distraction the vertebral bodies. Fluoroscopy confirmed good position of the implant but then selected 32 mm globus extend plate and placed 4-16 mm variable-angle screws. All screws excellent purchase locking mechanism was engaged.the wound was then copiously irrigated meticulous hemostasis was maintained a J-P drain was placed and the wounds  closed in layers with interrupted Vicryl and a running 4 subcuticular. Dermabond benzo and Steri-Strips and sterile  dressing was applied patient recovered in stable condition. At the end the case all needle counts sponge count were correct.

## 2018-03-24 NOTE — Anesthesia Postprocedure Evaluation (Signed)
Anesthesia Post Note  Patient: [name] [name]  Procedure(s) Performed: CERVICAL FOUR ANTERIOR CERVICAL CORPECTOMY (N/A )     Patient location during evaluation: PACU Anesthesia Type: General Level of consciousness: awake and alert Pain management: pain level controlled Vital Signs Assessment: post-procedure vital signs reviewed and stable Respiratory status: spontaneous breathing, nonlabored ventilation, respiratory function stable and patient connected to nasal cannula oxygen Cardiovascular status: blood pressure returned to baseline and stable Postop Assessment: no apparent nausea or vomiting Anesthetic complications: no    Last Vitals:  Vitals:   03/24/18 1245 03/24/18 1300  BP: (!) 163/95 (!) 164/94  Pulse: 71 66  Resp: (!) 7 12  Temp:  36.5 C  SpO2: 96% 100%    Last Pain:  Vitals:   03/24/18 1245  TempSrc:   PainSc: 7                  Phillips Groutarignan, Ronney [employer]

## 2018-03-25 ENCOUNTER — Inpatient Hospital Stay (HOSPITAL_COMMUNITY)

## 2018-03-25 LAB — CBC WITH DIFFERENTIAL/PLATELET
ABS IMMATURE GRANULOCYTES: 0.1 10*3/uL (ref 0.0–0.1)
BASOS ABS: 0 10*3/uL (ref 0.0–0.1)
BASOS PCT: 0 %
Eosinophils Absolute: 0 10*3/uL (ref 0.0–0.7)
Eosinophils Relative: 0 %
HCT: 43.5 % (ref 39.0–52.0)
HEMOGLOBIN: 13.7 g/dL (ref 13.0–17.0)
IMMATURE GRANULOCYTES: 1 %
LYMPHS PCT: 11 %
Lymphs Abs: 1 10*3/uL (ref 0.7–4.0)
MCH: 29.3 pg (ref 26.0–34.0)
MCHC: 31.5 g/dL (ref 30.0–36.0)
MCV: 92.9 fL (ref 78.0–100.0)
MONOS PCT: 3 %
Monocytes Absolute: 0.3 10*3/uL (ref 0.1–1.0)
NEUTROS ABS: 7.8 10*3/uL — AB (ref 1.7–7.7)
NEUTROS PCT: 85 %
Platelets: 122 10*3/uL — ABNORMAL LOW (ref 150–400)
RBC: 4.68 MIL/uL (ref 4.22–5.81)
RDW: 13 % (ref 11.5–15.5)
WBC: 9.1 10*3/uL (ref 4.0–10.5)

## 2018-03-25 LAB — GLUCOSE, CAPILLARY: Glucose-Capillary: 172 mg/dL — ABNORMAL HIGH (ref 70–99)

## 2018-03-25 MED ORDER — GI COCKTAIL ~~LOC~~
30.0000 mL | Freq: Once | ORAL | Status: AC
  Administered 2018-03-25: 30 mL via ORAL
  Filled 2018-03-25: qty 30

## 2018-03-25 MED ORDER — DOCUSATE SODIUM 100 MG PO CAPS
100.0000 mg | ORAL_CAPSULE | Freq: Two times a day (BID) | ORAL | Status: DC
  Administered 2018-03-27 – 2018-04-08 (×13): 100 mg via ORAL
  Filled 2018-03-25 (×23): qty 1

## 2018-03-25 MED ORDER — ATROPINE SULFATE 1 MG/10ML IJ SOSY
PREFILLED_SYRINGE | INTRAMUSCULAR | Status: AC
  Filled 2018-03-25: qty 10

## 2018-03-25 NOTE — Progress Notes (Signed)
Patient ID: [name] [name], male   DOB: [birth year], 52 y.o.   MRN: [id number] Vital signs are stable Patient's motor function appears to be reasonably good with 4 out of 5 grips bilaterally Patient notes he feels more tingling sensation in the fingertips Lower extremity strength is doing reasonably well Drain in place with minimal output For posterior surgery on Monday

## 2018-03-25 NOTE — Progress Notes (Signed)
Spoke with MD regarding pts foley. MD approved of leaving foley in due to surgery on Monday.

## 2018-03-25 NOTE — Progress Notes (Signed)
Physical Therapy Treatment Patient Details Name: [name] [name] MRN: [id number] DOB: [birth year] Today's Date: 03/25/2018    History of Present Illness Patient is a 52 y/o male with PMH of HTN, DDD lumbar spine and gout.  He was admitted after a fall at home with inability to get up.  Reports one other recent fall when his dog pulled him over.  States recent R hand numbness,tingling and imbalance.  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  Awating surgical decompression tomorrw, right now recieving steriods for symptoms.     PT Comments    Patient seen for mobility progression. Tolerated increased activity today and shows overall improvements in strength and function. Continues to rely on RW for upright support with mobility. Current POC remains appropriate. Follow up surgery planned for Monday per patient.  Follow Up Recommendations  CIR;Supervision/Assistance - 24 hour     Equipment Recommendations  Rolling walker with 5" wheels;3in1 (PT)    Recommendations for Other Services       Precautions / Restrictions Precautions Precautions: Fall Required Braces or Orthoses: Cervical Brace Cervical Brace: Hard collar;At all times Restrictions Weight Bearing Restrictions: No    Mobility  Bed Mobility               General bed mobility comments: Pt up in recliner upon my arrival  Transfers Overall transfer level: Needs assistance Equipment used: Rolling walker (2 wheeled);None Transfers: Sit to/from Stand Sit to Stand: Min assist         General transfer comment: assist for balance and some lifting help  Ambulation/Gait Ambulation/Gait assistance: Min assist Gait Distance (Feet): 80 Feet(x3) Assistive device: Rolling walker (2 wheeled) Gait Pattern/deviations: Step-to pattern;Step-through pattern;Decreased stride length;Narrow base of support;Scissoring;Shuffle;Decreased dorsiflexion - left;Decreased dorsiflexion - right Gait velocity: decreased Gait velocity  interpretation: <1.8 ft/sec, indicate of risk for recurrent falls General Gait Details: VCs for increased cadence to improve stability during ambulation   Stairs             Wheelchair Mobility    Modified Rankin (Stroke Patients Only)       Balance Overall balance assessment: Needs assistance Sitting-balance support: Feet supported Sitting balance-Leahy Scale: Good     Standing balance support: Bilateral upper extremity supported Standing balance-Leahy Scale: Poor Standing balance comment: Reliance on BUE support                            Cognition Arousal/Alertness: Awake/alert Behavior During Therapy: WFL for tasks assessed/performed Overall Cognitive Status: Within Functional Limits for tasks assessed                                        Exercises Other Exercises Other Exercises: Reviewed precautions    General Comments        Pertinent Vitals/Pain Pain Assessment: No/denies pain    Home Living                      Prior Function            PT Goals (current goals can now be found in the care plan section) Acute Rehab PT Goals Patient Stated Goal: to return to independent PT Goal Formulation: With patient Time For Goal Achievement: 04/06/18 Potential to Achieve Goals: Good Progress towards PT goals: Progressing toward goals    Frequency    Min  4X/week      PT Plan Current plan remains appropriate    Co-evaluation              AM-PAC PT "6 Clicks" Daily Activity  Outcome Measure  Difficulty turning over in bed (including adjusting bedclothes, sheets and blankets)?: A Little Difficulty moving from lying on back to sitting on the side of the bed? : Unable Difficulty sitting down on and standing up from a chair with arms (e.g., wheelchair, bedside commode, etc,.)?: Unable Help needed moving to and from a bed to chair (including a wheelchair)?: A Little Help needed walking in hospital room?: A  Lot Help needed climbing 3-5 steps with a railing? : Total 6 Click Score: 11    End of Session Equipment Utilized During Treatment: Gait belt Activity Tolerance: Patient tolerated treatment well Patient left: with call bell/phone within reach;in chair Nurse Communication: Mobility status PT Visit Diagnosis: Other abnormalities of gait and mobility (R26.89);Muscle weakness (generalized) (M62.81);History of falling (Z91.81)     Time: 3536-1443 PT Time Calculation (min) (ACUTE ONLY): 17 min  Charges:  $Gait Training: 8-22 mins                    G Codes:       Charlotte Crumb, PT DPT  Board Certified Neurologic Specialist [phone]    Fabio Asa 03/25/2018, 12:32 PM

## 2018-03-25 NOTE — Social Work (Signed)
CSW acknowledging consult for SNF placement. Aware CIR is following and that pt will have further surgeries. Will follow.   Doy HutchingIsabel H Shylee Durrett, LCSWA [facility] Clinical Social Work [phone]

## 2018-03-25 NOTE — Significant Event (Addendum)
Rapid Response Event Note  Overview: Unresponsive/Bradycardia/Hypoxic  Initial Focused Assessment:  Called by RN about patient having unresponsive after transferring from chair to bed. While RN was on the phone, she stated that the HR was dropping from the 60s into the 30s. I instructed the RN to call a Code Blue.  When I arrived, HR was in the 40s ? (patient was just on pulse ox, not telemetry), I was able to palpate a pulse, he was awake and answering questions but slow to respond. Patient was able to follow commands in all extremities 4/5 strength in all extremites. Patient was placed on the monitor, HR was in the 50s (per RN patient has been bradycardiac into the 40s while he sleeps). SBP in the 140-150s, MAP in the 100s, patient was hypoxic during event and was placed a NRB prior to my arrival. + Profusely diaphoretic, endorses being lightheaded, clammy/cool skin. Lung sounds are clear in the upper fields and lower fields are diminished. Patient endorses that he felt short of breath and has a "burning sensation" in chest. Patient's drain looked intact, per RN no changes in the neck ( no swelling or bleeding note), + collar. Patient denied pain.   Interventions: - STAT EKG - STAT CXR - STAT LABS (CBC/BMET) - NS Bolus 250cc x 1  Plan of Care:  - RN called NSU MD on call at 2254. MD returned page, orders received for GI cocktail x 1. - Symptoms improved, VS stabilized, and patient states he feels better. Oxygen weaned to 2L [place].  - Will follow as needed.  Event Summary:   at    Call Time 2223 Arrival Time 2225 End Time 2315   Mardi Cannady R

## 2018-03-26 LAB — BASIC METABOLIC PANEL
ANION GAP: 8 (ref 5–15)
BUN: 24 mg/dL — ABNORMAL HIGH (ref 6–20)
CALCIUM: 8.8 mg/dL — AB (ref 8.9–10.3)
CO2: 24 mmol/L (ref 22–32)
Chloride: 107 mmol/L (ref 98–111)
Creatinine, Ser: 1.14 mg/dL (ref 0.61–1.24)
GFR calc non Af Amer: >60 mL/min (ref >60)
Glucose, Bld: 159 mg/dL — ABNORMAL HIGH (ref 70–99)
POTASSIUM: 4 mmol/L (ref 3.5–5.1)
Sodium: 139 mmol/L (ref 135–145)

## 2018-03-26 LAB — GLUCOSE, CAPILLARY: Glucose-Capillary: 147 mg/dL — ABNORMAL HIGH (ref 70–99)

## 2018-03-26 NOTE — Progress Notes (Signed)
Physical Therapy Treatment Patient Details Name: [name] [name] MRN: [id number] DOB: [birth year] Today's Date: 03/26/2018    History of Present Illness Patient is a 52 y/o male with PMH of HTN, DDD lumbar spine and gout.  He was admitted after a fall at home with inability to get up.  Reports one other recent fall when his dog pulled him over.  States recent R hand numbness,tingling and imbalance.  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  Now s/p anterior postion of surgery complete; pan for posterio on 7/15; Recovery complicated by 2 syncopal episodes    PT Comments    Continuing work on functional mobility and activity tolerance;  Activity tolerance limited today by syncopal episode;   We initiated session on Room Air; Walking slowly around bed with RW when [name] [name] mentioned feeling a cramp/tightness in his L calf; stopped walking, became less talkative, and requested to sit, so removed foot rest and pt sat at the foot of the bed; Became diaphoretic, repotred central chest discomfort and stated he had to lay down; Assisted pt to lay down, and he became unresponsive to questions for approx 15 seconds; Staff emergency called and RNs/RT came quickly; HR 45, BP 95/55, O2 sats 61% lowest observed; Started NRB mask and O2 sats incr to 95-100%; Dr. Franky Machoabbell came in to assess [name] [name] as well; switched back to [place] 1.5 L; I assisted in getting [name] [name] in a better position in bed, and RN was starting an EKG;   Before I left, I looked at his L calf, which was tender to palpation midway up, no redness, no heat noted, and no exquisiste pain with passive dorsiflexion  Follow Up Recommendations  CIR;Supervision/Assistance - 24 hour     Equipment Recommendations  Rolling walker with 5" wheels;3in1 (PT)    Recommendations for Other Services       Precautions / Restrictions Precautions Precautions: Fall Required Braces or Orthoses: Cervical Brace Cervical Brace: Hard collar;At all times     Mobility  Bed Mobility Overal bed mobility: Needs Assistance Bed Mobility: Rolling;Sidelying to Sit Rolling: Min guard Sidelying to sit: Min assist       General bed mobility comments: Cues for technqiue, and to breathe  Transfers Overall transfer level: Needs assistance Equipment used: Rolling walker (2 wheeled) Transfers: Sit to/from Stand Sit to Stand: Mod assist         General transfer comment: assist for balance and some lifting help  Ambulation/Gait Ambulation/Gait assistance: Min assist Gait Distance (Feet): 7 Feet Assistive device: Rolling walker (2 wheeled) Gait Pattern/deviations: Step-to pattern;Step-through pattern;Decreased stride length;Narrow base of support;Scissoring;Shuffle;Decreased dorsiflexion - left;Decreased dorsiflexion - right     General Gait Details: Walkign slowly, then talking less and less, until pt requested to sit; removed foot rest and sat on foot of bed   Stairs             Wheelchair Mobility    Modified Rankin (Stroke Patients Only)       Balance     Sitting balance-Leahy Scale: Good     Standing balance support: Bilateral upper extremity supported Standing balance-Leahy Scale: Poor Standing balance comment: Reliance on BUE support                            Cognition Arousal/Alertness: Awake/alert Behavior During Therapy: WFL for tasks assessed/performed Overall Cognitive Status: Within Functional Limits for tasks assessed  Exercises      General Comments        Pertinent Vitals/Pain Pain Assessment: No/denies pain    Home Living                      Prior Function            PT Goals (current goals can now be found in the care plan section) Acute Rehab PT Goals Patient Stated Goal: to return to independent PT Goal Formulation: With patient Time For Goal Achievement: 04/06/18 Potential to Achieve Goals: Good Progress  towards PT goals: Not progressing toward goals - comment(limited by syncope)    Frequency    Min 4X/week      PT Plan Current plan remains appropriate    Co-evaluation              AM-PAC PT "6 Clicks" Daily Activity  Outcome Measure  Difficulty turning over in bed (including adjusting bedclothes, sheets and blankets)?: A Little Difficulty moving from lying on back to sitting on the side of the bed? : Unable Difficulty sitting down on and standing up from a chair with arms (e.g., wheelchair, bedside commode, etc,.)?: Unable Help needed moving to and from a bed to chair (including a wheelchair)?: A Little Help needed walking in hospital room?: A Lot Help needed climbing 3-5 steps with a railing? : Total 6 Click Score: 11    End of Session Equipment Utilized During Treatment: Gait belt Activity Tolerance: Other (comment)(Limited by syncopal episode) Patient left: in bed;with call bell/phone within reach;with nursing/sitter in room Nurse Communication: Mobility status;Other (comment)(events around syncope) PT Visit Diagnosis: Other abnormalities of gait and mobility (R26.89);Muscle weakness (generalized) (M62.81);History of falling (Z91.81)     Time: 4098-1191 PT Time Calculation (min) (ACUTE ONLY): 36 min  Charges:  $Gait Training: 8-22 mins $Therapeutic Activity: 8-22 mins                    G Codes:       Van Clines, PT  Acute Rehabilitation Services Pager [phone] Office [phone]    Levi Aland 03/26/2018, 11:36 AM

## 2018-03-26 NOTE — Progress Notes (Signed)
OT Cancellation Note  Patient Details Name: [name] [name] MRN: [id number] DOB: 1965-11-23   Cancelled Treatment:    Reason Eval/Treat Not Completed: Patient at procedure or test/ unavailable;Medical issues which prohibited therapy.  Pt was working with PT on first attempt, then had another episode of bradycardia, light headedness, hypotension.  Awaiting cardiology consult.  Will hold OT at this time, will check back and see as medically appropriate.   Devanshi Califf Shawneelandonarpe, OTR/L [phone]   Jeani HawkingConarpe, Emeterio Balke M 03/26/2018, 10:49 AM

## 2018-03-26 NOTE — Progress Notes (Signed)
Called to Patient's room for being unresponsive and low O2 sats.  Upon arrival, pt was laying in bed with 100% NRB on sat 100%, HR 55, diaphoretic and alert and oriented.  Pt had had a spell like this last evening. He had been walking with PT on Roomair for 8-10 mins felt dizzy, stated he needed to sit down and then needed to lay down.  PT assisted him to sit and lay down.  O2 sats dropped into the 50's and had a bradycardic cardiavc rhythm.  He was placed on a NRB.  MD and RR RN present. He was placed back on his previous O2 of 1.5 lpm [place] and sat stayed 100%.

## 2018-03-26 NOTE — Progress Notes (Signed)
Secretary called per request of RN and PT. During PT pt developed pain to his left calf, requested to sit down, he then endorsed chest pain and SOB. PT set him down and then assisted him to a lying position, he became diaphoretic and unresponsive for approx 15 seconds. Stas dropped to 60's, placed on NRB, BP 95/55, HR 45. On my arrival pt was clammy, a/o x3, Dr. Franky Machoabbell at bedside. EKG complete with NSR.  Consult wit cardiology.  Assisted with EKG and rewrapped LLW IV

## 2018-03-26 NOTE — Progress Notes (Addendum)
Patient ID: [name] [name], male   DOB: 1966/04/24, 52 y.o.   MRN: [id number] BP 139/80 (BP Location: Left Arm)   Pulse 62   Temp 98 F (36.7 C) (Oral)   Resp 18   Ht 5\' 9"  (1.753 m)   Wt (!) 139.1 kg (306 lb 10.6 oz)   SpO2 97%   BMI 45.29 kg/m  Patient with second episode of being lightheaded, unresponsive, bradycardic, and concomitant low blood pressure.  Currently awake, oriented by 4, clear and fluent speech.  Strength is at baseline Will consult cardiology, previous ekg last night was wnl Will perform another this morning.  Wound is clean, dry, no signs of infection.

## 2018-03-27 ENCOUNTER — Inpatient Hospital Stay (HOSPITAL_COMMUNITY)

## 2018-03-27 ENCOUNTER — Encounter (HOSPITAL_COMMUNITY): Payer: Self-pay | Admitting: *Deleted

## 2018-03-27 ENCOUNTER — Encounter (HOSPITAL_COMMUNITY)
Admission: EM | Disposition: A | Discharge status: Rehabilitation Facility | Payer: Self-pay | Source: Home / Self Care | Attending: Neurosurgery

## 2018-03-27 DIAGNOSIS — M79609 Pain in unspecified limb: Secondary | ICD-10-CM

## 2018-03-27 DIAGNOSIS — Z01810 Encounter for preprocedural cardiovascular examination: Secondary | ICD-10-CM

## 2018-03-27 DIAGNOSIS — I82401 Acute embolism and thrombosis of unspecified deep veins of right lower extremity: Secondary | ICD-10-CM

## 2018-03-27 DIAGNOSIS — I2601 Septic pulmonary embolism with acute cor pulmonale: Secondary | ICD-10-CM

## 2018-03-27 DIAGNOSIS — I2602 Saddle embolus of pulmonary artery with acute cor pulmonale: Secondary | ICD-10-CM

## 2018-03-27 LAB — ECHOCARDIOGRAM COMPLETE
Height: 69 in
Weight: 4906.56 oz

## 2018-03-27 SURGERY — POSTERIOR CERVICAL FUSION/FORAMINOTOMY LEVEL 2
Anesthesia: General

## 2018-03-27 MED ORDER — IOPAMIDOL (ISOVUE-370) INJECTION 76%
INTRAVENOUS | Status: AC
  Administered 2018-03-27: 100 mL
  Filled 2018-03-27: qty 100

## 2018-03-27 MED ORDER — HEPARIN (PORCINE) IN NACL 100-0.45 UNIT/ML-% IJ SOLN
1600.0000 [IU]/h | INTRAMUSCULAR | Status: DC
  Administered 2018-03-27: 1800 [IU]/h via INTRAVENOUS
  Filled 2018-03-27: qty 250

## 2018-03-27 MED ORDER — PERFLUTREN LIPID MICROSPHERE
1.0000 mL | INTRAVENOUS | Status: AC | PRN
  Administered 2018-03-27: 2 mL via INTRAVENOUS
  Filled 2018-03-27: qty 10

## 2018-03-27 MED FILL — Thrombin (Recombinant) For Soln 20000 Unit: CUTANEOUS | Qty: 2 | Status: AC

## 2018-03-27 NOTE — Progress Notes (Signed)
  Echocardiogram 2D Echocardiogram has been performed.  [name] SavoyCasey N Ruqaya [name] 03/27/2018, 3:25 PM

## 2018-03-27 NOTE — Progress Notes (Signed)
OT Cancellation Note  Patient Details Name: [name] [name] MRN: [id number] DOB: 01-22-1966   Cancelled Treatment:    Reason Eval/Treat Not Completed: Patient not medically ready(HR 30s per RN and requesting to hold at this time)  Felecia ShellingJones, [place] Coffin B  Shalandria Elsbernd, Brynn   OTR/L Pager: [phone] Office: [phone] .  03/27/2018, 8:52 AM

## 2018-03-27 NOTE — Progress Notes (Signed)
*  Preliminary Results* Bilateral lower extremity venous duplex completed. Bilateral lower extremities are positive for acute deep vein thrombosis involving bilateral posterior tibial and peroneal veins. There is no evidence of Baker's cyst bilaterally.  Preliminary results discussed with Julieta BelliniMika, RN.  03/27/2018 12:11 PM Gertie FeyMichelle Caylin Nass, BS, RVT, RDCS

## 2018-03-27 NOTE — Consult Note (Signed)
Cardiology Consult    Patient ID: [name] [name] MRN: [id number], DOB/AGE: [id number]   Admit date: 03/22/2018 Date of Consult: 03/27/2018  Primary Physician: [name] Dopp, MD Primary Cardiologist: New Requesting Provider: Dr. Wynetta Emery Reason for Consultation: Preop, syncope and bradycardia  [name] [name] is a 52 y.o. male who is being seen today for the evaluation of preop evaluation, syncope and bradycardia at the request of Dr. Wynetta Emery.   Patient Profile    52 yo male with PMH of HTN, Gout and DDD who presented for staged spinal surgery and had a syncopal episode and bradycardia after his first surgery.   Past Medical History   Past Medical History:  Diagnosis Date  . Degenerative disc disease, lumbar   . Gout   . Hypertension     Past Surgical History:  Procedure Laterality Date  . MENISCUS REPAIR Left   . TENDON REPAIR     tricept tendon     Allergies  Allergies  Allergen Reactions  . Nsaids Other (See Comments)    G6PD  . Sulfa Antibiotics Other (See Comments)    G6PD    History of Present Illness    [name] [name] is a 52 yo male with PMH of HTN, Gout and DDD.  He denies ever having seen a cardiologist in the past.  Followed by PCP for his hypertension and is a combination pill of lisinopril and hydrochlorothiazide. Reports a history of degenerative disc disease which has caused intermittent right-sided numbness since 2016. Has been evaluated for OSA in the past, but states he has not been diagnosed with this. He presented to the ED after having a fall on Monday, and then an additional fall on Wednesday when attempting to lean over and clean up something off the floor.  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  He was seen by Dr. Wynetta Emery with neurosurgery who felt like surgical decompression of the spinal cord was required.  He was placed on IV steroids, and underwent an anterior approach with decompression of C4-C5 with  discectomies on 03/24/2018.  Postoperatively he initially felt well, had good upper and lower extremity strength.  On Saturday he felt well, and set up in a chair most of the day.  States around 10:00 he began to get tired and got up to move himself back to bed.  States he wants to move in bed and sat on the edge with RN attempting to assist him to lay back in the bed.  He became unresponsive for 10 to 15 seconds, with his heart rate reported to be dropping into the low 30s.  Rapid response and MD were called patient was noted to be diaphoretic, lightheaded and clammy on their assessment.  EKG shows sinus rhythm with nonspecific T wave abnormalities.  Chest x-ray was normal.  Follow-up labs showed stable electrolytes and hemoglobin 13.7.  Reports he is able to rest that evening, and actually got up and walked around the unit the following morning.  After walking with PT and arriving back to his room he reported left calf pain and requested to sit down.  Stated he did have slight discomfort in epigastric area and mildly short of breath.  PT sat him down and assisted into the lying position where he became diaphoretic and had another episode of unresponsiveness lasting approximately 15 seconds.  His sats dropped into the 60s and was placed on nonrebreather and became slightly hypotensive with a blood pressure in  the 90s and heart rate in the 40s.  EKG at that time showed sinus rhythm with nonspecific T wave changes.  In talking with the patient he was alert and remembers all of the episodes surrounding both incidents on Saturday and Sunday.   Cardiology has been in called in regards to pre op evaluation and his episodes of syncope. Currently undergoing a left lower extremity doppler. He is planned for 2nd spinal surgery this afternoon using a posterior approach.   Inpatient Medications    . baclofen  10 mg Oral BID  . Chlorhexidine Gluconate Cloth  6 each Topical Once  . dexamethasone  10 mg Intravenous Q6H  .  docusate sodium  100 mg Oral BID  . pantoprazole  40 mg Oral Daily  . sodium chloride flush  3 mL Intravenous Q12H  . sodium chloride flush  3 mL Intravenous Q12H    Family History    History reviewed. No pertinent family history.  Social History    Social History   Socioeconomic History  . Marital status: Legally Separated    Spouse name: Not on file  . Number of children: Not on file  . Years of education: Not on file  . Highest education level: Not on file  Occupational History  . Not on file  Social Needs  . Financial resource strain: Not on file  . Food insecurity:    Worry: Not on file    Inability: Not on file  . Transportation needs:    Medical: Not on file    Non-medical: Not on file  Tobacco Use  . Smoking status: Never Smoker  . Smokeless tobacco: Never Used  Substance and Sexual Activity  . Alcohol use: Never    Frequency: Never  . Drug use: Never  . Sexual activity: Not on file  Lifestyle  . Physical activity:    Days per week: Not on file    Minutes per session: Not on file  . Stress: Not on file  Relationships  . Social connections:    Talks on phone: Not on file    Gets together: Not on file    Attends religious service: Not on file    Active member of club or organization: Not on file    Attends meetings of clubs or organizations: Not on file    Relationship status: Not on file  . Intimate partner violence:    Fear of current or ex partner: Not on file    Emotionally abused: Not on file    Physically abused: Not on file    Forced sexual activity: Not on file  Other Topics Concern  . Not on file  Social History Narrative  . Not on file     Review of Systems    See HPI  All other systems reviewed and are otherwise negative except as noted above.  Physical Exam    Blood pressure 133/88, pulse (!) 41, temperature (!) 97.4 F (36.3 C), temperature source Oral, resp. rate 10, height 5\' 9"  (1.753 m), weight (!) 306 lb 10.6 oz (139.1  kg), SpO2 100 %.  General: Pleasant, obese AAM, NAD Psych: Normal affect. Neuro: Alert and oriented X 3. Moves all extremities spontaneously. HEENT: Aspen collar in place.   Neck: Supple, unable to assess JVD. Lungs:  Resp regular and unlabored, CTA. Heart: RRR no s3, s4, or murmurs. Abdomen: Soft, non-tender, non-distended, BS + x 4.  Extremities: No clubbing, cyanosis or edema. DP/PT/Radials 2+ and equal bilaterally.  Labs  Troponin (Point of Care Test) No results for input(s): TROPIPOC in the last 72 hours. No results for input(s): CKTOTAL, CKMB, TROPONINI in the last 72 hours. Lab Results  Component Value Date   WBC 9.1 03/25/2018   HGB 13.7 03/25/2018   HCT 43.5 03/25/2018   MCV 92.9 03/25/2018   PLT 122 (L) 03/25/2018    Recent Labs  Lab 03/25/18 2258  NA 139  K 4.0  CL 107  CO2 24  BUN 24*  CREATININE 1.14  CALCIUM 8.8*  GLUCOSE 159*   No results found for: CHOL, HDL, LDLCALC, TRIG No results found for: [facility]   Radiology Studies    Dg Cervical Spine 1 View  Result Date: 03/24/2018 CLINICAL DATA:  Cervical fusion. EXAM: DG CERVICAL SPINE - 1 VIEW; DG C-ARM 61-120 MIN COMPARISON:  Cervical spine CT 03/22/2018. FINDINGS: 2 intraoperative spot fluoro films are submitted. Images show C4 corpectomy with cage placement. Anterior cervical plate extends from C3 to C5. Fine bone detail not well demonstrated due to fluoroscopy. IMPRESSION: Intraoperative assessment during C4 corpectomy and anterior fusion from C3-C5. Electronically Signed   By: Kennith Center M.D.   On: 03/24/2018 12:16   Ct Head Wo Contrast  Result Date: 03/22/2018 CLINICAL DATA:  Right-sided numbness and tingling, recent fall EXAM: CT HEAD WITHOUT CONTRAST TECHNIQUE: Contiguous axial images were obtained from the base of the skull through the vertex without intravenous contrast. COMPARISON:  None. FINDINGS: Brain: The ventricular system is prominent, disproportionate to the level of cortical atrophy.  Therefore, normal pressure hydrocephalus would be a consideration if appropriate clinically. The fourth ventricle and basilar cisterns are unremarkable. No hemorrhage, mass lesion, or acute infarction is seen. Vascular: No vascular abnormality is noted on this unenhanced study. Skull: On bone window images, no calvarial abnormality is seen. Sinuses/Orbits: There is retention cyst and possibly small amount of fluid in the right maxillary sinus consistent with right maxillary sinus disease. Mild mucosal thickening is present medially in the left maxillary sinus. The remainder of paranasal sinuses are well pneumatized. Other: None. IMPRESSION: 1. Ventriculomegaly, disproportionate to the degree of atrophy. Consider the possibility of normal pressure hydrocephalus. 2. No acute intracranial abnormality. 3. Right maxillary sinus disease. Electronically Signed   By: Dwyane Dee M.D.   On: 03/22/2018 14:34   Ct Cervical Spine Wo Contrast  Result Date: 03/22/2018 CLINICAL DATA:  Right-sided numbness and tingling since 2016. Patient fell on Monday. Myelopathy. EXAM: CT CERVICAL SPINE WITHOUT CONTRAST TECHNIQUE: Multidetector CT imaging of the cervical spine was performed without intravenous contrast. Multiplanar CT image reconstructions were also generated. COMPARISON:  Same day MRI FINDINGS: Alignment: Slight reversal cervical lordosis. Intact craniocervical relationship and atlantodental interval. No listhesis. Skull base and vertebrae: The vertebral body heights are maintained without evidence of acute fracture. No suspicious osseous lesions. Soft tissues and spinal canal: C3 through C6 ossification of the posterior longitudinal ligament most prominent posterior to C3 and C4 markedly the narrowing the lumen of the central canal. This is most evident posterior to C3-4 where there is left central ossification occupying up to 75% of the AP dimension on the left. Disc levels: C2-C3: Left paracentral disc protrusion  impressing upon the ventral aspect of the thecal sac. Moderate left-sided spinal stenosis. Patent neural foramina. C3-C4: Moderate disc flattening with left paracentral ossification of the posterior longitudinal ligament impressing upon the thecal sac and cord on the left up to 50% canal stenosis due to the ossified longitudinal ligament on the left. Bilateral uncovertebral joint  hypertrophy with uncinate spurring. Mild right and moderate left foraminal stenosis. C4-C5: Prominent left broad based central ossified posterior longitudinal ligament with underlying diffuse disc bulge and uncovertebral joint osteoarthritis. Severe spinal stenosis up to 50% narrowing the AP dimension. Moderate left L5 foraminal stenosis. C5-C6: Central to left central disc herniation with partially calcified disc impressing upon the thecal sac. Resultant mild spinal stenosis. Slight bilateral foraminal encroachment from uncinate spurring. C6-C7: Negative without significant disc herniation identified. Shoulder artifacts limit assessment. C7-T1: Negative given limitations of streak artifacts from the patient's shoulders. Upper chest: Negative. 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 No acute cervical spine fracture. Electronically Signed   By: Tollie Eth M.D.   On: 03/22/2018 21:41   Mr Cervical Spine Wo Contrast  Result Date: 03/22/2018 CLINICAL DATA:  Initial evaluation for acute right upper and lower extremity weakness, fall. EXAM: MRI CERVICAL AND LUMBAR SPINE WITHOUT CONTRAST TECHNIQUE: Multiplanar and multiecho pulse sequences of the cervical spine, to include the craniocervical junction and cervicothoracic junction, and lumbar spine, were obtained without intravenous contrast. COMPARISON:  None. FINDINGS: MRI CERVICAL SPINE FINDINGS Alignment: Straightening with slight reversal of the normal cervical lordosis. No listhesis.  Vertebrae: Vertebral body heights are maintained without evidence for acute or chronic fracture. No definite fracture seen within the cervical spine status post recent trauma. Bone marrow signal intensity diffusely decreased on T1 weighted imaging, suspected to be related to body habitus. No discrete or worrisome osseous lesions. Mild reactive edema about the C3-4 interspace favored to be degenerative in nature. Cord: Linear T1/T2 hypointensity within the left ventral epidural space extending from C3 through C5 favored to reflect ossification of the posterior longitudinal ligament. Resultant severe spinal stenosis at C3-4 through C5-6. Abnormal T2 signal within the cervical spinal cord extending from C3 through C6 consistent with cord contusion/edema. Changes more pronounced at the right aspect of the cord. Possible small focus of associated hemorrhage at the level of C5-6 (series 6, image 8). Posterior Fossa, vertebral arteries, paraspinal tissues: Visualized brain and posterior fossa within normal limits. Craniocervical junction normal. Paraspinous and prevertebral soft tissues within normal limits. No findings to suggest acute ligamentous injury. Normal intravascular flow voids present within the vertebral arteries bilaterally. Disc levels: Underlying congenital spinal stenosis. C2-C3: Left paracentral disc protrusion indents the ventral thecal sac, flattening the left hemi cord. No cord signal changes. Moderate left-sided spinal stenosis. Neural foramina remain patent. C3-C4: Left paracentral osseous spur/ossification of the posterior longitudinal ligament. Underlying mild diffuse disc bulge with bilateral uncovertebral hypertrophy. Resultant severe spinal stenosis with spinal cord compression, most notable on the left. Thecal sac measures 3 mm in AP diameter at its most narrow point. Abnormal cord edema. Mild right with moderate left C4 foraminal stenosis. C4-C5: Left paracentral osseous spurs/ossification of  the posterior longitudinal ligament. Underlying mild diffuse disc bulge with uncovertebral hypertrophy. Severe spinal stenosis with compression of the cervical spinal cord. Abnormal cord signal intensity. Moderate left C5 foraminal stenosis. C5-C6: Diffuse disc bulge with bilateral uncovertebral hypertrophy. Bulging disc slightly asymmetric to the left. Resultant mild spinal stenosis with bilateral foraminal narrowing. C6-C7: Mild disc bulge with uncovertebral hypertrophy. No significant stenosis. C7-T1:  Unremarkable. Visualized upper thoracic spine demonstrates no significant finding. MRI LUMBAR SPINE FINDINGS Segmentation: Normal segmentation. Lowest well-formed disc labeled the L5-S1 level. Alignment: Mild scoliosis. Vertebral bodies otherwise normally aligned with preservation of the normal lumbar lordosis. No listhesis. Vertebrae: Vertebral body heights maintained without evidence  for acute or chronic fracture. Underlying bone marrow signal intensity diffusely decreased on T1 weighted imaging, suspected to be related to body habitus. No discrete or worrisome osseous lesions. Reactive endplate changes with associated Schmorl's nodes present about the L4-5 and L5-S1 interspaces. Conus medullaris and cauda equina: Conus extends to the L2 level. Conus and cauda equina appear normal. Paraspinal and other soft tissues: Paraspinous soft tissues demonstrate no acute finding. Visualized visceral structures grossly unremarkable. Disc levels: Mild diffuse congenital shortening of the pedicles. L1-2:  Unremarkable. L2-3: Mild diffuse disc bulge. Superimposed shallow right foraminal disc protrusion contacts the exiting right L2 nerve root. Mild facet ligament flavum hypertrophy. No significant spinal stenosis. Mild right neural foraminal narrowing. L3-4: Mild diffuse disc bulge with intervertebral disc space narrowing and disc desiccation. Mild to moderate facet ligament flavum hypertrophy. Short pedicles with resultant  mild spinal stenosis. Mild bilateral L3 foraminal narrowing. L4-5: Mild diffuse disc bulge with intervertebral disc space narrowing. Chronic reactive endplate changes with marginal endplate spurring. Mild facet ligament flavum hypertrophy. Mild epidural lipomatosis. Short pedicles with resultant mild canal and bilateral L4 foraminal narrowing. L5-S1: Broad posterior disc bulge with intervertebral disc space narrowing and disc desiccation. Chronic reactive endplate changes. Moderate right with mild left facet hypertrophy. Epidural lipomatosis. No significant stenosis. 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 Abnormal cord signal extending from C3 through C6 consistent with cord edema/contusion. Neuro surgical consultation recommended. MRI LUMBAR SPINE IMPRESSION 1. No acute abnormality within is the lumbar spine. 2. Shallow right foraminal disc protrusion at L2-3, contacting and potentially irritating the exiting right L2 nerve root. 3. 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 4. Prominent right-sided facet hypertrophy at L5-S1, which could contribute to lower back pain. Findings communicated to Dr. Erma Heritage at 6:47 p.m. on 03/22/2018. Electronically Signed   By: Rise Mu M.D.   On: 03/22/2018 18:51   Mr Lumbar Spine Wo Contrast  Result Date: 03/22/2018 CLINICAL DATA:  Initial evaluation for acute right upper and lower extremity weakness, fall. EXAM: MRI CERVICAL AND LUMBAR SPINE WITHOUT CONTRAST TECHNIQUE: Multiplanar and multiecho pulse sequences of the cervical spine, to include the craniocervical junction and cervicothoracic junction, and lumbar spine, were obtained without intravenous contrast. COMPARISON:  None. FINDINGS: MRI CERVICAL SPINE FINDINGS Alignment: Straightening with slight reversal of the normal cervical lordosis. No listhesis.  Vertebrae: Vertebral body heights are maintained without evidence for acute or chronic fracture. No definite fracture seen within the cervical spine status post recent trauma. Bone marrow signal intensity diffusely decreased on T1 weighted imaging, suspected to be related to body habitus. No discrete or worrisome osseous lesions. Mild reactive edema about the C3-4 interspace favored to be degenerative in nature. Cord: Linear T1/T2 hypointensity within the left ventral epidural space extending from C3 through C5 favored to reflect ossification of the posterior longitudinal ligament. Resultant severe spinal stenosis at C3-4 through C5-6. Abnormal T2 signal within the cervical spinal cord extending from C3 through C6 consistent with cord contusion/edema. Changes more pronounced at the right aspect of the cord. Possible small focus of associated hemorrhage at the level of C5-6 (series 6, image 8). Posterior Fossa, vertebral arteries, paraspinal tissues: Visualized brain and posterior fossa within normal limits. Craniocervical junction normal. Paraspinous and prevertebral soft tissues within normal limits. No findings to suggest acute ligamentous injury. Normal intravascular flow voids present within the vertebral arteries bilaterally. Disc levels: Underlying congenital  spinal stenosis. C2-C3: Left paracentral disc protrusion indents the ventral thecal sac, flattening the left hemi cord. No cord signal changes. Moderate left-sided spinal stenosis. Neural foramina remain patent. C3-C4: Left paracentral osseous spur/ossification of the posterior longitudinal ligament. Underlying mild diffuse disc bulge with bilateral uncovertebral hypertrophy. Resultant severe spinal stenosis with spinal cord compression, most notable on the left. Thecal sac measures 3 mm in AP diameter at its most narrow point. Abnormal cord edema. Mild right with moderate left C4 foraminal stenosis. C4-C5: Left paracentral osseous spurs/ossification of  the posterior longitudinal ligament. Underlying mild diffuse disc bulge with uncovertebral hypertrophy. Severe spinal stenosis with compression of the cervical spinal cord. Abnormal cord signal intensity. Moderate left C5 foraminal stenosis. C5-C6: Diffuse disc bulge with bilateral uncovertebral hypertrophy. Bulging disc slightly asymmetric to the left. Resultant mild spinal stenosis with bilateral foraminal narrowing. C6-C7: Mild disc bulge with uncovertebral hypertrophy. No significant stenosis. C7-T1:  Unremarkable. Visualized upper thoracic spine demonstrates no significant finding. MRI LUMBAR SPINE FINDINGS Segmentation: Normal segmentation. Lowest well-formed disc labeled the L5-S1 level. Alignment: Mild scoliosis. Vertebral bodies otherwise normally aligned with preservation of the normal lumbar lordosis. No listhesis. Vertebrae: Vertebral body heights maintained without evidence for acute or chronic fracture. Underlying bone marrow signal intensity diffusely decreased on T1 weighted imaging, suspected to be related to body habitus. No discrete or worrisome osseous lesions. Reactive endplate changes with associated Schmorl's nodes present about the L4-5 and L5-S1 interspaces. Conus medullaris and cauda equina: Conus extends to the L2 level. Conus and cauda equina appear normal. Paraspinal and other soft tissues: Paraspinous soft tissues demonstrate no acute finding. Visualized visceral structures grossly unremarkable. Disc levels: Mild diffuse congenital shortening of the pedicles. L1-2:  Unremarkable. L2-3: Mild diffuse disc bulge. Superimposed shallow right foraminal disc protrusion contacts the exiting right L2 nerve root. Mild facet ligament flavum hypertrophy. No significant spinal stenosis. Mild right neural foraminal narrowing. L3-4: Mild diffuse disc bulge with intervertebral disc space narrowing and disc desiccation. Mild to moderate facet ligament flavum hypertrophy. Short pedicles with resultant  mild spinal stenosis. Mild bilateral L3 foraminal narrowing. L4-5: Mild diffuse disc bulge with intervertebral disc space narrowing. Chronic reactive endplate changes with marginal endplate spurring. Mild facet ligament flavum hypertrophy. Mild epidural lipomatosis. Short pedicles with resultant mild canal and bilateral L4 foraminal narrowing. L5-S1: Broad posterior disc bulge with intervertebral disc space narrowing and disc desiccation. Chronic reactive endplate changes. Moderate right with mild left facet hypertrophy. Epidural lipomatosis. No significant stenosis. 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 Abnormal cord signal extending from C3 through C6 consistent with cord edema/contusion. Neuro surgical consultation recommended. MRI LUMBAR SPINE IMPRESSION 1. No acute abnormality within is the lumbar spine. 2. Shallow right foraminal disc protrusion at L2-3, contacting and potentially irritating the exiting right L2 nerve root. 3. 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 4. Prominent right-sided facet hypertrophy at L5-S1, which could contribute to lower back pain. Findings communicated to Dr. Erma Heritage at 6:47 p.m. on 03/22/2018. Electronically Signed   By: Rise Mu M.D.   On: 03/22/2018 18:51   Dg Chest Port 1 View  Result Date: 03/25/2018 CLINICAL DATA:  Short of breath with chest pain. EXAM: PORTABLE CHEST 1 VIEW COMPARISON:  None. FINDINGS: Support apparatus projects over the apices. Mildly degraded exam due to AP portable technique and patient body habitus. Patient rotated to the right. Normal heart size for  level of inspiration. No pleural effusion or pneumothorax. Clear lungs. IMPRESSION: No acute cardiopulmonary disease. Electronically Signed   By: Jeronimo Greaves M.D.   On: 03/25/2018 23:21   Dg C-arm 1-60 Min  Result Date:  03/24/2018 CLINICAL DATA:  Cervical fusion. EXAM: DG CERVICAL SPINE - 1 VIEW; DG C-ARM 61-120 MIN COMPARISON:  Cervical spine CT 03/22/2018. FINDINGS: 2 intraoperative spot fluoro films are submitted. Images show C4 corpectomy with cage placement. Anterior cervical plate extends from C3 to C5. Fine bone detail not well demonstrated due to fluoroscopy. IMPRESSION: Intraoperative assessment during C4 corpectomy and anterior fusion from C3-C5. Electronically Signed   By: Kennith Center M.D.   On: 03/24/2018 12:16    ECG & Cardiac Imaging    EKG:  The EKG was personally reviewed and demonstrates SR with nonspecific T wave abnormality   Assessment & Plan    52 yo male with PMH of HTN, Gout and DDD who presented for staged spinal surgery and had a syncopal episode and bradycardia after his first surgery.   1. Preop evaluation/Syncope/Bradycardia: Reports prior to admission he had several falls in the setting of weakness on his right sided which he associated with his DDD. Never had any syncope. Noted to have had 2 episodes of witnessed syncope last 10-15 seconds each time. These were accompanied with bradycardia and soft blood pressures. One after getting up to move to the bed, and second after walking with PT. No pauses or heart block noted on telemetry review. EKG with both episodes shows SR. No chest pain, but did have a brief episode of epigastric discomfort prior to episode on Saturday.  -- seems reasonable to check an echo as well.   2. Left calf pain: Reported this after walking with PT around the unit yesterday morning. Had first spinal surgery on 7/12. Currently getting LE dopplers, and [place] tech reported bilateral DVTs. RN to inform attending.  -- would order CT chest angio to rule PE.  -- consider IV heparin but will need surgery input prior initiating given his recent surgery.   3. HTN: blood pressure has been stable without the need for antihypertensives.   Janice Coffin,  NP-C Pager [phone] 03/27/2018, 11:21 AM

## 2018-03-27 NOTE — Progress Notes (Signed)
ANTICOAGULATION CONSULT NOTE - Initial Consult  Pharmacy Consult for heparin Indication: pulmonary embolus  Allergies  Allergen Reactions  . Nsaids Other (See Comments)    G6PD  . Sulfa Antibiotics Other (See Comments)    G6PD    Patient Measurements: Height: 5\' 9"  (175.3 cm) Weight: (!) 306 lb 10.6 oz (139.1 kg) IBW/kg (Calculated) : 70.7 Heparin Dosing Weight: 103 kg   Vital Signs: Temp: 97.9 F (36.6 C) (07/15 1553) Temp Source: Oral (07/15 1553) BP: 159/89 (07/15 1553) Pulse Rate: 48 (07/15 1600)  Labs: Recent Labs    03/25/18 2258  HGB 13.7  HCT 43.5  PLT 122*  CREATININE 1.14    Estimated Creatinine Clearance: 105.2 mL/min (by C-G formula based on SCr of 1.14 mg/dL).   Medical History: Past Medical History:  Diagnosis Date  . Degenerative disc disease, lumbar   . Gout   . Hypertension     Medications:  Medications Prior to Admission  Medication Sig Dispense Refill Last Dose  . naproxen sodium (ALEVE) 220 MG tablet Take 220 mg by mouth 2 (two) times daily as needed (pain).   prn at unk    Assessment: [age] YOM who was admitted for staged spinal surgery now with new bilateral pulmonary embolus on CT along with bilateral distal DVTs. Of note, CT showed an incidental finding of a 4 cm aneurysmal ascending aorta. Pharmacy consulted to start IV heparin WITHOUT bolus. H/H wnl. Plt low. Surgery has been postponed   Goal of Therapy:  Heparin level 0.3-0.7 units/ml Monitor platelets by anticoagulation protocol: Yes   Plan:  -Start IV heparin at 1800 units/hr. NO bolus  -F/u 6 hr HL -Monitor daily CBC, HL and s/s of bleeding   Vinnie LevelBenjamin Eliott Amparan, PharmD., BCPS Clinical Pharmacist Clinical phone for 03/27/18 until 3:30pm: [phone] If after 3:30pm, [facility] for [facility]-specific pharmacist

## 2018-03-27 NOTE — Progress Notes (Signed)
Subjective: Patient reports Doing better this morning significant improvement in neurologic function since surgery did have episode of syncopal episode approximately 15-20 seconds 1 unresponsive yesterday. Was worked up with chest x-ray lab work and EKG also experienced some calf Pain at the time.  Objective: Vital signs in last 24 hours: Temp:  [97.4 F (36.3 C)-98 F (36.7 C)] 97.4 F (36.3 C) (07/15 0820) Pulse Rate:  [36-89] 41 (07/15 0820) Resp:  [10-18] 10 (07/15 0820) BP: (106-140)/(62-101) 133/88 (07/15 0820) SpO2:  [90 %-100 %] 100 % (07/15 0820)  Intake/Output from previous day: 07/14 0701 - 07/15 0700 In: 1088.4 [P.O.:240; I.V.:538.4; IV Piggyback:300] Out: 1450 [Urine:1450] Intake/Output this shift: Total I/O In: -  Out: 250 [Urine:250]  Strength significant improvement 4+ out of 5 right upper extremity 5 out of 5 right lower extremity incision soft clean dry and intact  Lab Results: Recent Labs    03/25/18 2258  WBC 9.1  HGB 13.7  HCT 43.5  PLT 122*   BMET Recent Labs    03/25/18 2258  NA 139  K 4.0  CL 107  CO2 24  GLUCOSE 159*  BUN 24*  CREATININE 1.14  CALCIUM 8.8*    Studies/Results: Dg Chest Port 1 View  Result Date: 03/25/2018 CLINICAL DATA:  Short of breath with chest pain. EXAM: PORTABLE CHEST 1 VIEW COMPARISON:  None. FINDINGS: Support apparatus projects over the apices. Mildly degraded exam due to AP portable technique and patient body habitus. Patient rotated to the right. Normal heart size for level of inspiration. No pleural effusion or pneumothorax. Clear lungs. IMPRESSION: No acute cardiopulmonary disease. Electronically Signed   By: Jeronimo GreavesKyle  Talbot M.D.   On: 03/25/2018 23:21    Assessment/Plan: Decompression and fusion this afternoon. However we will order a stat venous duplex Doppler for left calf pain an episode of unresponsiveness and bradycardia. Also will have cardiology evaluate.  LOS: 5 days     Chicquita Mendel P 03/27/2018, 9:00  AM

## 2018-03-28 ENCOUNTER — Inpatient Hospital Stay (HOSPITAL_COMMUNITY)

## 2018-03-28 ENCOUNTER — Encounter (HOSPITAL_COMMUNITY)
Admission: EM | Disposition: A | Discharge status: Rehabilitation Facility | Payer: Self-pay | Source: Home / Self Care | Attending: Neurosurgery

## 2018-03-28 ENCOUNTER — Inpatient Hospital Stay (HOSPITAL_COMMUNITY): Admitting: Anesthesiology

## 2018-03-28 ENCOUNTER — Encounter (HOSPITAL_COMMUNITY): Payer: Self-pay | Admitting: Interventional Radiology

## 2018-03-28 DIAGNOSIS — I2699 Other pulmonary embolism without acute cor pulmonale: Secondary | ICD-10-CM

## 2018-03-28 HISTORY — PX: IR IVC FILTER PLMT / S&I /IMG GUID/MOD SED: IMG701

## 2018-03-28 HISTORY — PX: ANTERIOR CERVICAL DECOMP/DISCECTOMY FUSION: SHX1161

## 2018-03-28 LAB — CBC
HCT: 43.9 % (ref 39.0–52.0)
HEMOGLOBIN: 14.2 g/dL (ref 13.0–17.0)
MCH: 28.9 pg (ref 26.0–34.0)
MCHC: 32.3 g/dL (ref 30.0–36.0)
MCV: 89.4 fL (ref 78.0–100.0)
PLATELETS: 106 10*3/uL — AB (ref 150–400)
RBC: 4.91 MIL/uL (ref 4.22–5.81)
RDW: 12.3 % (ref 11.5–15.5)
WBC: 8 10*3/uL (ref 4.0–10.5)

## 2018-03-28 LAB — HEPARIN LEVEL (UNFRACTIONATED): Heparin Unfractionated: 0.98 IU/mL — ABNORMAL HIGH (ref 0.30–0.70)

## 2018-03-28 LAB — PROTIME-INR
INR: 1.32
PROTHROMBIN TIME: 16.3 s — AB (ref 11.4–15.2)

## 2018-03-28 SURGERY — ANTERIOR CERVICAL DECOMPRESSION/DISCECTOMY FUSION 1 LEVEL
Anesthesia: General

## 2018-03-28 MED ORDER — FENTANYL CITRATE (PF) 250 MCG/5ML IJ SOLN
INTRAMUSCULAR | Status: AC
  Filled 2018-03-28: qty 5

## 2018-03-28 MED ORDER — PANTOPRAZOLE SODIUM 40 MG PO TBEC: 40.0000 mg | DELAYED_RELEASE_TABLET | Freq: Every day | ORAL | Status: DC

## 2018-03-28 MED ORDER — ONDANSETRON HCL 4 MG PO TABS: 4.0000 mg | ORAL_TABLET | Freq: Four times a day (QID) | ORAL | Status: DC | PRN

## 2018-03-28 MED ORDER — ONDANSETRON HCL 4 MG/2ML IJ SOLN: 4.0000 mg | Freq: Four times a day (QID) | INTRAMUSCULAR | Status: DC | PRN

## 2018-03-28 MED ORDER — SODIUM CHLORIDE 0.9 % IV SOLN: 250.0000 mL | INTRAVENOUS | Status: DC

## 2018-03-28 MED ORDER — HEMOSTATIC AGENTS (NO CHARGE) OPTIME
TOPICAL | Status: DC | PRN
  Administered 2018-03-28 (×2): 1 via TOPICAL

## 2018-03-28 MED ORDER — CYCLOBENZAPRINE HCL 10 MG PO TABS: 10.0000 mg | ORAL_TABLET | Freq: Three times a day (TID) | ORAL | Status: DC | PRN

## 2018-03-28 MED ORDER — CEFAZOLIN SODIUM-DEXTROSE 2-3 GM-%(50ML) IV SOLR
INTRAVENOUS | Status: DC | PRN
  Administered 2018-03-28: 2 g via INTRAVENOUS

## 2018-03-28 MED ORDER — PROTAMINE SULFATE 10 MG/ML IV SOLN
10.0000 mg | Freq: Once | INTRAVENOUS | Status: AC
  Administered 2018-03-28: 10 mg via INTRAVENOUS
  Filled 2018-03-28 (×2): qty 1

## 2018-03-28 MED ORDER — LIDOCAINE HCL 1 % IJ SOLN
INTRAMUSCULAR | Status: DC | PRN
  Administered 2018-03-28: 10 mL

## 2018-03-28 MED ORDER — PROPOFOL 10 MG/ML IV BOLUS
INTRAVENOUS | Status: DC | PRN
  Administered 2018-03-28: 200 mg via INTRAVENOUS

## 2018-03-28 MED ORDER — PROMETHAZINE HCL 25 MG/ML IJ SOLN: 6.2500 mg | INTRAMUSCULAR | Status: DC | PRN

## 2018-03-28 MED ORDER — ACETAMINOPHEN 650 MG RE SUPP: 650.0000 mg | RECTAL | Status: DC | PRN

## 2018-03-28 MED ORDER — SODIUM CHLORIDE 0.9% FLUSH: 3.0000 mL | Freq: Two times a day (BID) | INTRAVENOUS | Status: DC

## 2018-03-28 MED ORDER — SODIUM CHLORIDE 0.9 % IV SOLN
INTRAVENOUS | Status: DC | PRN
  Administered 2018-03-28: 10:00:00

## 2018-03-28 MED ORDER — MIDAZOLAM HCL 2 MG/2ML IJ SOLN
INTRAMUSCULAR | Status: AC
  Filled 2018-03-28: qty 2

## 2018-03-28 MED ORDER — PHENOL 1.4 % MT LIQD: 1.0000 | OROMUCOSAL | Status: DC | PRN

## 2018-03-28 MED ORDER — ONDANSETRON HCL 4 MG/2ML IJ SOLN
INTRAMUSCULAR | Status: DC | PRN
  Administered 2018-03-28: 4 mg via INTRAVENOUS

## 2018-03-28 MED ORDER — IOPAMIDOL (ISOVUE-300) INJECTION 61%
INTRAVENOUS | Status: AC
  Administered 2018-03-28: 45 mL
  Filled 2018-03-28: qty 100

## 2018-03-28 MED ORDER — FENTANYL CITRATE (PF) 100 MCG/2ML IJ SOLN
25.0000 ug | INTRAMUSCULAR | Status: DC | PRN
Start: 2018-03-28 — End: 2018-03-28

## 2018-03-28 MED ORDER — ACETAMINOPHEN 325 MG PO TABS: 650.0000 mg | ORAL_TABLET | ORAL | Status: DC | PRN

## 2018-03-28 MED ORDER — MEPERIDINE HCL 50 MG/ML IJ SOLN
6.2500 mg | INTRAMUSCULAR | Status: DC | PRN
Start: 2018-03-28 — End: 2018-03-28

## 2018-03-28 MED ORDER — ALUM & MAG HYDROXIDE-SIMETH 200-200-20 MG/5ML PO SUSP: 30.0000 mL | Freq: Four times a day (QID) | ORAL | Status: DC | PRN

## 2018-03-28 MED ORDER — SUCCINYLCHOLINE CHLORIDE 20 MG/ML IJ SOLN
INTRAMUSCULAR | Status: DC | PRN
  Administered 2018-03-28: 200 mg via INTRAVENOUS

## 2018-03-28 MED ORDER — SUGAMMADEX SODIUM 200 MG/2ML IV SOLN
INTRAVENOUS | Status: DC | PRN
  Administered 2018-03-28: 275 mg via INTRAVENOUS

## 2018-03-28 MED ORDER — MENTHOL 3 MG MT LOZG: 1.0000 | LOZENGE | OROMUCOSAL | Status: DC | PRN

## 2018-03-28 MED ORDER — ROCURONIUM BROMIDE 10 MG/ML (PF) SYRINGE
PREFILLED_SYRINGE | INTRAVENOUS | Status: DC | PRN
  Administered 2018-03-28: 40 mg via INTRAVENOUS

## 2018-03-28 MED ORDER — FENTANYL CITRATE (PF) 100 MCG/2ML IJ SOLN
INTRAMUSCULAR | Status: DC | PRN
  Administered 2018-03-28: 50 ug via INTRAVENOUS

## 2018-03-28 MED ORDER — MIDAZOLAM HCL 5 MG/5ML IJ SOLN
INTRAMUSCULAR | Status: DC | PRN
  Administered 2018-03-28: 1 mg via INTRAVENOUS

## 2018-03-28 MED ORDER — SODIUM CHLORIDE 0.9% FLUSH: 3.0000 mL | INTRAVENOUS | Status: DC | PRN

## 2018-03-28 MED ORDER — LACTATED RINGERS IV SOLN
INTRAVENOUS | Status: DC | PRN
  Administered 2018-03-28: 10:00:00 via INTRAVENOUS

## 2018-03-28 MED ORDER — PROPOFOL 10 MG/ML IV BOLUS
INTRAVENOUS | Status: AC
  Filled 2018-03-28: qty 20

## 2018-03-28 MED ORDER — POTASSIUM CHLORIDE IN NACL 20-0.9 MEQ/L-% IV SOLN
INTRAVENOUS | Status: DC
  Administered 2018-03-28 – 2018-04-04 (×11): via INTRAVENOUS
  Filled 2018-03-28 (×14): qty 1000

## 2018-03-28 MED ORDER — LIDOCAINE HCL 1 % IJ SOLN
INTRAMUSCULAR | Status: AC
  Filled 2018-03-28: qty 20

## 2018-03-28 MED ORDER — LIDOCAINE 2% (20 MG/ML) 5 ML SYRINGE
INTRAMUSCULAR | Status: DC | PRN
  Administered 2018-03-28: 40 mg via INTRAVENOUS

## 2018-03-28 MED ORDER — MIDAZOLAM HCL 2 MG/2ML IJ SOLN: 0.5000 mg | Freq: Once | INTRAMUSCULAR | Status: DC | PRN

## 2018-03-28 MED ORDER — 0.9 % SODIUM CHLORIDE (POUR BTL) OPTIME
TOPICAL | Status: DC | PRN
  Administered 2018-03-28: 1000 mL

## 2018-03-28 MED ORDER — CEFAZOLIN SODIUM-DEXTROSE 2-4 GM/100ML-% IV SOLN
2.0000 g | Freq: Three times a day (TID) | INTRAVENOUS | Status: AC
  Administered 2018-03-28 – 2018-03-29 (×2): 2 g via INTRAVENOUS
  Filled 2018-03-28 (×2): qty 100

## 2018-03-28 SURGICAL SUPPLY — 65 items
BAG DECANTER FOR FLEXI CONT (MISCELLANEOUS) ×2 IMPLANT
BASKET BONE COLLECTION (BASKET) ×2 IMPLANT
BENZOIN TINCTURE PRP APPL 2/3 (GAUZE/BANDAGES/DRESSINGS) ×2 IMPLANT
BIT DRILL NEURO 2X3.1 SFT TUCH (MISCELLANEOUS) ×1 IMPLANT
BUR MATCHSTICK NEURO 3.0 LAGG (BURR) ×2 IMPLANT
CANISTER SUCT 3000ML PPV (MISCELLANEOUS) ×2 IMPLANT
CARTRIDGE OIL MAESTRO DRILL (MISCELLANEOUS) ×1 IMPLANT
DERMABOND ADVANCED (GAUZE/BANDAGES/DRESSINGS)
DERMABOND ADVANCED .7 DNX12 (GAUZE/BANDAGES/DRESSINGS) IMPLANT
DIFFUSER DRILL AIR PNEUMATIC (MISCELLANEOUS) ×2 IMPLANT
DRAIN JACKSON PRATT 1/4 1325 (MISCELLANEOUS) ×2 IMPLANT
DRAPE C-ARM 42X72 X-RAY (DRAPES) IMPLANT
DRAPE LAPAROTOMY 100X72 PEDS (DRAPES) ×2 IMPLANT
DRAPE MICROSCOPE LEICA (MISCELLANEOUS) ×2 IMPLANT
DRILL NEURO 2X3.1 SOFT TOUCH (MISCELLANEOUS) ×2
DRSG OPSITE 4X5.5 SM (GAUZE/BANDAGES/DRESSINGS) ×2 IMPLANT
DRSG OPSITE POSTOP 4X6 (GAUZE/BANDAGES/DRESSINGS) ×2 IMPLANT
DURAPREP 6ML APPLICATOR 50/CS (WOUND CARE) ×2 IMPLANT
ELECT COATED BLADE 2.86 ST (ELECTRODE) ×2 IMPLANT
ELECT REM PT RETURN 9FT ADLT (ELECTROSURGICAL) ×2
ELECTRODE REM PT RTRN 9FT ADLT (ELECTROSURGICAL) ×1 IMPLANT
EVACUATOR SILICONE 100CC (DRAIN) ×2 IMPLANT
FLOSEAL 5ML (HEMOSTASIS) ×4 IMPLANT
GAUZE SPONGE 4X4 12PLY STRL (GAUZE/BANDAGES/DRESSINGS) ×2 IMPLANT
GAUZE SPONGE 4X4 16PLY XRAY LF (GAUZE/BANDAGES/DRESSINGS) IMPLANT
GLOVE BIO SURGEON STRL SZ7 (GLOVE) IMPLANT
GLOVE BIO SURGEON STRL SZ8 (GLOVE) ×2 IMPLANT
GLOVE BIOGEL PI IND STRL 7.0 (GLOVE) IMPLANT
GLOVE BIOGEL PI IND STRL 7.5 (GLOVE) ×1 IMPLANT
GLOVE BIOGEL PI IND STRL 8 (GLOVE) ×1 IMPLANT
GLOVE BIOGEL PI INDICATOR 7.0 (GLOVE)
GLOVE BIOGEL PI INDICATOR 7.5 (GLOVE) ×1
GLOVE BIOGEL PI INDICATOR 8 (GLOVE) ×1
GLOVE ECLIPSE 7.0 STRL STRAW (GLOVE) ×2 IMPLANT
GLOVE ECLIPSE 7.5 STRL STRAW (GLOVE) ×6 IMPLANT
GLOVE EXAM NITRILE LRG STRL (GLOVE) IMPLANT
GLOVE EXAM NITRILE XL STR (GLOVE) IMPLANT
GLOVE EXAM NITRILE XS STR PU (GLOVE) IMPLANT
GLOVE INDICATOR 8.5 STRL (GLOVE) ×2 IMPLANT
GOWN STRL REUS W/ TWL LRG LVL3 (GOWN DISPOSABLE) ×1 IMPLANT
GOWN STRL REUS W/ TWL XL LVL3 (GOWN DISPOSABLE) ×1 IMPLANT
GOWN STRL REUS W/TWL 2XL LVL3 (GOWN DISPOSABLE) ×2 IMPLANT
GOWN STRL REUS W/TWL LRG LVL3 (GOWN DISPOSABLE) ×1
GOWN STRL REUS W/TWL XL LVL3 (GOWN DISPOSABLE) ×1
HALTER HD/CHIN CERV TRACTION D (MISCELLANEOUS) ×2 IMPLANT
HEMOSTAT POWDER KIT SURGIFOAM (HEMOSTASIS) ×2 IMPLANT
KIT BASIN OR (CUSTOM PROCEDURE TRAY) ×2 IMPLANT
KIT TURNOVER KIT B (KITS) ×2 IMPLANT
NEEDLE HYPO 18GX1.5 BLUNT FILL (NEEDLE) ×2 IMPLANT
NEEDLE SPNL 20GX3.5 QUINCKE YW (NEEDLE) ×2 IMPLANT
NS IRRIG 1000ML POUR BTL (IV SOLUTION) ×2 IMPLANT
OIL CARTRIDGE MAESTRO DRILL (MISCELLANEOUS) ×2
PACK LAMINECTOMY NEURO (CUSTOM PROCEDURE TRAY) ×2 IMPLANT
PAD ARMBOARD 7.5X6 YLW CONV (MISCELLANEOUS) ×6 IMPLANT
PIN DISTRACTION 14MM (PIN) IMPLANT
RUBBERBAND STERILE (MISCELLANEOUS) ×4 IMPLANT
SPONGE INTESTINAL PEANUT (DISPOSABLE) ×2 IMPLANT
SPONGE SURGIFOAM ABS GEL SZ50 (HEMOSTASIS) ×2 IMPLANT
STRIP CLOSURE SKIN 1/2X4 (GAUZE/BANDAGES/DRESSINGS) ×2 IMPLANT
SUT VIC AB 3-0 SH 8-18 (SUTURE) ×2 IMPLANT
SUT VICRYL 4-0 PS2 18IN ABS (SUTURE) ×2 IMPLANT
TAPE CLOTH 4X10 WHT NS (GAUZE/BANDAGES/DRESSINGS) IMPLANT
TOWEL GREEN STERILE (TOWEL DISPOSABLE) ×2 IMPLANT
TOWEL GREEN STERILE FF (TOWEL DISPOSABLE) ×2 IMPLANT
WATER STERILE IRR 1000ML POUR (IV SOLUTION) ×2 IMPLANT

## 2018-03-28 NOTE — Progress Notes (Signed)
PT Cancellation Note  Patient Details Name: [name] [name] MRN: [id number] DOB: [birth year]   Cancelled Treatment:    Reason Eval/Treat Not Completed: Medical issues which prohibited therapy(pt with bleeding from incision this morning and per RN pt is to be on bedrest but no active order stating such . Will defer per RN MIka)   Beckey Polkowski B Obert Espindola 03/28/2018, 8:55 AM  Delaney MeigsMaija Tabor Macie Baum, PT [phone]

## 2018-03-28 NOTE — Progress Notes (Signed)
Foley still in place MD aware and agrees with foley to stay in place due to unstable hemodynamic.

## 2018-03-28 NOTE — Progress Notes (Signed)
Subjective: Patient reports Patient started bleeding from his skin incision around 5:30 this morning. Has slightly worsening of his dysphagia but denies any difficulty breathing did get a little anxious about the whole event.  Objective: Vital signs in last 24 hours: Temp:  [97.4 F (36.3 C)-98 F (36.7 C)] 98 F (36.7 C) (07/16 0300) Pulse Rate:  [40-65] 43 (07/16 0300) Resp:  [10-20] 15 (07/16 0300) BP: (119-165)/(73-99) 156/99 (07/16 0300) SpO2:  [100 %] 100 % (07/16 0300)  Intake/Output from previous day: 07/15 0701 - 07/16 0700 In: 360.1 [P.O.:240; I.V.:120.1] Out: 1350 [Urine:1350] Intake/Output this shift: Total I/O In: 120.1 [I.V.:120.1] Out: 400 [Urine:400]  Awake and alert still neurologically stable from yesterday afternoon significantly improved from preop. 4+ out of 5 strength right upper extremity grip remains 4 out of 55 out of 5 strength right lower cavity. Incision mild to moderate amount of saturating a recent dressing placed on however do not see any active oozing since we stopped the heparin. There is a mild to moderate subcutaneous hematoma that collected in the skin.  Lab Results: Recent Labs    03/25/18 2258 03/28/18 0129  WBC 9.1 8.0  HGB 13.7 14.2  HCT 43.5 43.9  PLT 122* 106*   BMET Recent Labs    03/25/18 2258  NA 139  K 4.0  CL 107  CO2 24  GLUCOSE 159*  BUN 24*  CREATININE 1.14  CALCIUM 8.8*    Studies/Results: Ct Angio Chest Pe W Or Wo Contrast  Result Date: 03/27/2018 CLINICAL DATA:  Chest pain, recent surgery, DVT. EXAM: CT ANGIOGRAPHY CHEST WITH CONTRAST TECHNIQUE: Multidetector CT imaging of the chest was performed using the standard protocol during bolus administration of intravenous contrast. Multiplanar CT image reconstructions and MIPs were obtained to evaluate the vascular anatomy. CONTRAST:  ISOVUE-370 IOPAMIDOL (ISOVUE-370) INJECTION 76% COMPARISON:  Chest radiograph March 25, 2018. FINDINGS: CARDIOVASCULAR: Adequate  contrast opacification of the pulmonary artery's. Main pulmonary artery is not enlarged. RIGHT upper lobar central pulmonary embolism, occlusive within segmental branches. Occlusive RIGHT middle lobe subsegmental pulmonary emboli. Occlusive segmental and subsegmental RIGHT lower lobe pulmonary emboli. Central nonocclusive filling defect LEFT main pulmonary artery casting into the lobar and segmental branches. Heart size is mildly enlarged, no right heart strain (RV/LV .8). No pericardial effusion. Thoracic aorta is normal course, 4 cm ascending aorta. Mild pulmonary venous congestion. MEDIASTINUM/NODES: No lymphadenopathy by CT size criteria. LUNGS/PLEURA: Tracheobronchial tree is patent, no pneumothorax. No pleural effusions, focal consolidations, pulmonary nodules or masses. Bilateral lower lobe atelectasis. Punctate LEFT upper lobe subpleural granuloma. UPPER ABDOMEN: Included view of the abdomen is unremarkable. MUSCULOSKELETAL: Nonacute. Moderate mid and lower thoracic ventral endplate spurring. Review of the MIP images confirms the above findings. IMPRESSION: 1. Bilateral acute pulmonary emboli, occlusive within multiple segmental and subsegmental branches. No RIGHT heart strain. 2. Mild cardiomegaly and pulmonary venous congestion. 3. **An incidental finding of potential clinical significance has been found. 4 cm aneurysmal ascending aorta. Recommend annual imaging followup by CTA or MRA. This recommendation follows 2010 ACCF/AHA/AATS/ACR/ASA/SCA/SCAI/SIR/STS/SVM Guidelines for the Diagnosis and Management of Patients with Thoracic Aortic Disease. Circulation. 2010; 121: B147-W295** 4. Critical Value/emergent results were discussed by Dr. Oleta Mouse, radiology at the time of interpretation on 03/27/2018 at 5:00 pm to Dr. Donalee Citrin , who verbally acknowledged these results. Electronically Signed   By: Awilda Metro M.D.   On: 03/27/2018 17:04    Assessment/Plan: 52 year old gentleman postop day for now from  a anterior cervical corpectomy now anticoagulation day 1  with heparin however significant bleeding complication around the skin incision appears to be neurologically stable and intact. Does not have any compromise to his breathing is still able to swallow liquids and soft foods. I've stop the heparin we will not be able to and coagulate in that fashion. So I will ask radiology to place an IVC filter we'll discuss with cardiology and pharmacy alternative modes of any coagulation possibly Coumadin and slow ramp up versus Xeralto which I think now we have a reversal agent for.  I do not think the patient needs to go back to the OR for evacuation of the subcutaneous hematoma. Patient remains on Decadron will continue to observe this over the next few hours off heparin and ensure stability.  LOS: 6 days     [name] [name] P 03/28/2018, 6:43 AM

## 2018-03-28 NOTE — Progress Notes (Signed)
Patient ID: [name] [name], male   DOB: [birth year], 52 y.o.   MRN: [id number] Patient with continued oozing from his cervical incision despite stopping the heparin and administering protamine. Neurologic the patient remains intact and improved from preop was no new numbness tingling weakness in his arm or his leg. Because of the persistent oozing have recommended reexploration of his anterior cervical wound for evacuation of hematoma and inspection for resting any active bleeders. I extensively gone over the risks and benefits of this procedure with the patient and family as well as perioperative course expectations of outcome alternatives of surgery and he has family understand and agree to proceed forward.

## 2018-03-28 NOTE — Transfer of Care (Signed)
Immediate Anesthesia Transfer of Care Note  Patient: [name] [name]  Procedure(s) Performed: Reexploration of anterior cervical wound (N/A )  Patient Location: PACU  Anesthesia Type:General  Level of Consciousness: awake, alert  and oriented  Airway & Oxygen Therapy: Patient Spontanous Breathing and Patient connected to face mask oxygen  Post-op Assessment: Report given to RN, Post -op Vital signs reviewed and stable and Patient moving all extremities X 4  Post vital signs: Reviewed and stable  Last Vitals:  Vitals Value Taken Time  BP 159/98 03/28/2018 11:25 AM  Temp    Pulse 58 03/28/2018 11:25 AM  Resp 13 03/28/2018 11:25 AM  SpO2 98 % 03/28/2018 11:25 AM  Vitals shown include unvalidated device data.  Last Pain:  Vitals:   03/28/18 0830  TempSrc:   PainSc: 6       Patients Stated Pain Goal: 3 (03/28/18 0830)  Complications: No apparent anesthesia complications

## 2018-03-28 NOTE — Progress Notes (Signed)
OT Cancellation Note  Patient Details Name: Azucena FreedCornell Liska MRN: [id number] DOB: 1966/03/03   Cancelled Treatment:    Reason Eval/Treat Not Completed: Patient at procedure or test/ unavailable. Currently in OR.  Jeneen RinksBailey A Leemon Ayala A. Brett Albinooffey, M.S., OTR/L Acute Rehab Department: [phone] 03/28/2018, 9:59 AM

## 2018-03-28 NOTE — Progress Notes (Signed)
ANTICOAGULATION CONSULT NOTE  Pharmacy Consult for heparin Indication: pulmonary embolus  Allergies  Allergen Reactions  . Nsaids Other (See Comments)    G6PD  . Sulfa Antibiotics Other (See Comments)    G6PD    Patient Measurements: Height: 5\' 9"  (175.3 cm) Weight: (!) 306 lb 10.6 oz (139.1 kg) IBW/kg (Calculated) : 70.7 Heparin Dosing Weight: 103 kg   Vital Signs: Temp: 98 F (36.7 C) (07/16 0300) Temp Source: Oral (07/16 0300) BP: 156/99 (07/16 0300) Pulse Rate: 43 (07/16 0300)  Labs: Recent Labs    03/25/18 2258 03/28/18 0129 03/28/18 0722  HGB 13.7 14.2  --   HCT 43.5 43.9  --   PLT 122* 106*  --   LABPROT  --   --  16.3*  INR  --   --  1.32  HEPARINUNFRC  --  0.98*  --   CREATININE 1.14  --   --     Estimated Creatinine Clearance: 105.2 mL/min (by C-G formula based on SCr of 1.14 mg/dL).   Medical History: Past Medical History:  Diagnosis Date  . Degenerative disc disease, lumbar   . Gout   . Hypertension     Medications:  Medications Prior to Admission  Medication Sig Dispense Refill Last Dose  . naproxen sodium (ALEVE) 220 MG tablet Take 220 mg by mouth 2 (two) times daily as needed (pain).   prn at unk    Assessment: [age] YOM who was admitted for staged spinal surgery now on heparin gtt for new bilateral pulmonary embolus on CT along with bilateral distal DVTs. Of note, CT showed an incidental finding of a 4 cm aneurysmal ascending aorta. (No boluses) Overnight started bleeding from incisions, heparin stopped and Dr. Wynetta Emeryram reversed with protamine. Plan to place IVC and start Coumadin or Xarelto. INR 1.32. CBC stable  Goal of Therapy:  Heparin level 0.3-0.7 units/ml Monitor platelets by anticoagulation protocol: Yes   Plan:  Holding all anticoag IVC filter placement today F/U restart of anticoag for acute PE/DVTs. Consider restarting around 7/18 per Cards Monitor daily CBC and s/s of bleeding   Enzo BiNathan Yvette Roark, PharmD, BCPS Clinical  Pharmacist Phone number [phone] 03/28/2018 8:51 AM

## 2018-03-28 NOTE — Consult Note (Signed)
Chief Complaint: Patient was seen in consultation today for IVC filter placement.  Referring Physician(s): Dr. Donalee Citrin  Supervising Physician: Gilmer Mor  Patient Status: [facility] - In-pt  History of Present Illness: [name] [name] is a 52 y.o. male with a past medical history of degenerative disc disease, gout and HTN presented to [facility] ED via EMS on 7/10 c/o weakness in right upper and lower extremities thought to be due to his degenerative disc disease as he has had intermittent weakness before today, however the weakness progressively worsened recently. He reported fall x 1 at home due to his knee giving out, he struck his head at that time and subsequently c/o tingling in both lower extremities when bending forward.  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 Abnormal cord signal extending from C3 through C6 consistent with cord edema/contusion. Neuro surgical consultation recommended.  Neurosurgery was consulted who started patient on IV Decadron on 7/10 and eventually proceeded with anterior cervical corpectomy and fusion.   Rapid response was called on 7/13 due to patient becoming unresponsive after transferring from chair to bed. Rapid response again was called on 7/14 for same. Cardiology was consulted for syncopal episodes x 2 and venous doppler of BLE was ordered d/t patient complaints of calf pain - ultimately it was determined patient was (+) for DVT in BLE. CTA was ordered to assess for PE and was determined patient had bilateral acute pulmonary emboli without right heart strain. He was started on IV heparin on 7/15 for above.   Patient reported large amounts of bleeding from surgical incision of neck around 530 on 7/16 - he was evaluated by neurosurgery who stopped heparin at that time and consulted IR for removable IVC filter placement.  Case discussed with Dr. Gilmer Mor who is amenable to proceed with IVC filter placement today.   Past Medical History:  Diagnosis Date  . Degenerative disc disease, lumbar   . Gout   . Hypertension     Past Surgical History:  Procedure Laterality Date  . ANTERIOR CERVICAL CORPECTOMY N/A 03/24/2018   Procedure: CERVICAL FOUR ANTERIOR CERVICAL CORPECTOMY;  Surgeon: Donalee Citrin, MD;  Location: [facility] [place] OR;  Service: Neurosurgery;  Laterality: N/A;  . MENISCUS REPAIR Left   . TENDON REPAIR     tricept tendon    Allergies: Nsaids and Sulfa antibiotics  Medications: Prior to Admission medications   Medication Sig Start Date End Date Taking? Authorizing Provider  naproxen sodium (ALEVE) 220 MG tablet Take 220 mg by mouth 2 (two) times daily as needed (pain).   Yes [provider]     History reviewed. No pertinent family history.  Social History   Socioeconomic History  . Marital status: Legally Separated    Spouse name: Not on file  . Number of children: Not on file  . Years of education: Not on file  . Highest education level: Not on file  Occupational History  . Not on file  Social Needs  . Financial resource strain: Not on file  . Food insecurity:    Worry: Not on file    Inability: Not on file  . Transportation needs:    Medical: Not on file    Non-medical: Not on file  Tobacco Use  . Smoking status: Never Smoker  . Smokeless tobacco: Never Used  Substance and Sexual Activity  . Alcohol use: Never    Frequency: Never  .  Drug use: Never  . Sexual activity: Not on file  Lifestyle  . Physical activity:    Days per week: Not on file    Minutes per session: Not on file  . Stress: Not on file  Relationships  . Social connections:    Talks on phone: Not on file    Gets together: Not on file    Attends religious service: Not on file    Active member of club or organization: Not on file    Attends meetings of clubs or organizations: Not on file    Relationship status: Not on file  Other  Topics Concern  . Not on file  Social History Narrative  . Not on file     Review of Systems: A 12 point ROS discussed and pertinent positives are indicated in the HPI above.  All other systems are negative.  Review of Systems  Constitutional: Positive for activity change and fatigue. Negative for appetite change and diaphoresis.  Respiratory: Negative for cough, shortness of breath and wheezing.   Cardiovascular: Positive for leg swelling. Negative for chest pain.  Gastrointestinal: Negative for abdominal pain, diarrhea, nausea and vomiting.  Musculoskeletal: Positive for neck pain and neck stiffness.  Skin: Positive for rash.  Neurological: Positive for weakness and light-headedness (with transfers). Negative for dizziness and speech difficulty.  Hematological: Bruises/bleeds easily.  Psychiatric/Behavioral: Negative for agitation, behavioral problems, confusion and decreased concentration.    Vital Signs: BP (!) 156/99 (BP Location: Right Arm)   Pulse (!) 43   Temp 97.9 F (36.6 C)   Resp 15   Ht 5\' 9"  (1.753 m)   Wt (!) 306 lb 10.6 oz (139.1 kg)   SpO2 100%   BMI 45.29 kg/m   Physical Exam  Constitutional: He is oriented to person, place, and time. He appears well-developed and well-nourished. No distress.  Eyes: Pupils are equal, round, and reactive to light.  Neck:  C-collar in place with chuck placed underneath.   Cardiovascular: Normal rate, regular rhythm and normal heart sounds. Exam reveals no friction rub.  No murmur heard. Pulmonary/Chest: Effort normal and breath sounds normal. He has no wheezes. He has no rales.  Abdominal: Soft. Bowel sounds are normal. He exhibits no distension. There is no tenderness.  Neurological: He is alert and oriented to person, place, and time.  Skin: Skin is warm and dry. No rash noted. He is not diaphoretic.  Psychiatric: He has a normal mood and affect. His behavior is normal. Judgment and thought content normal.     MD  Evaluation Airway: WNL Heart: WNL Abdomen: WNL Chest/ Lungs: WNL ASA  Classification: 2 Mallampati/Airway Score: One   Imaging: Dg Cervical Spine 1 View  Result Date: 03/24/2018 CLINICAL DATA:  Cervical fusion. EXAM: DG CERVICAL SPINE - 1 VIEW; DG C-ARM 61-120 MIN COMPARISON:  Cervical spine CT 03/22/2018. FINDINGS: 2 intraoperative spot fluoro films are submitted. Images show C4 corpectomy with cage placement. Anterior cervical plate extends from C3 to C5. Fine bone detail not well demonstrated due to fluoroscopy. IMPRESSION: Intraoperative assessment during C4 corpectomy and anterior fusion from C3-C5. Electronically Signed   By: Kennith Center M.D.   On: 03/24/2018 12:16   Ct Head Wo Contrast  Result Date: 03/22/2018 CLINICAL DATA:  Right-sided numbness and tingling, recent fall EXAM: CT HEAD WITHOUT CONTRAST TECHNIQUE: Contiguous axial images were obtained from the base of the skull through the vertex without intravenous contrast. COMPARISON:  None. FINDINGS: Brain: The ventricular system is prominent, disproportionate to  the level of cortical atrophy. Therefore, normal pressure hydrocephalus would be a consideration if appropriate clinically. The fourth ventricle and basilar cisterns are unremarkable. No hemorrhage, mass lesion, or acute infarction is seen. Vascular: No vascular abnormality is noted on this unenhanced study. Skull: On bone window images, no calvarial abnormality is seen. Sinuses/Orbits: There is retention cyst and possibly small amount of fluid in the right maxillary sinus consistent with right maxillary sinus disease. Mild mucosal thickening is present medially in the left maxillary sinus. The remainder of paranasal sinuses are well pneumatized. Other: None. IMPRESSION: 1. Ventriculomegaly, disproportionate to the degree of atrophy. Consider the possibility of normal pressure hydrocephalus. 2. No acute intracranial abnormality. 3. Right maxillary sinus disease. Electronically  Signed   By: Dwyane Dee M.D.   On: 03/22/2018 14:34   Ct Angio Chest Pe W Or Wo Contrast  Result Date: 03/27/2018 CLINICAL DATA:  Chest pain, recent surgery, DVT. EXAM: CT ANGIOGRAPHY CHEST WITH CONTRAST TECHNIQUE: Multidetector CT imaging of the chest was performed using the standard protocol during bolus administration of intravenous contrast. Multiplanar CT image reconstructions and MIPs were obtained to evaluate the vascular anatomy. CONTRAST:  ISOVUE-370 IOPAMIDOL (ISOVUE-370) INJECTION 76% COMPARISON:  Chest radiograph March 25, 2018. FINDINGS: CARDIOVASCULAR: Adequate contrast opacification of the pulmonary artery's. Main pulmonary artery is not enlarged. RIGHT upper lobar central pulmonary embolism, occlusive within segmental branches. Occlusive RIGHT middle lobe subsegmental pulmonary emboli. Occlusive segmental and subsegmental RIGHT lower lobe pulmonary emboli. Central nonocclusive filling defect LEFT main pulmonary artery casting into the lobar and segmental branches. Heart size is mildly enlarged, no right heart strain (RV/LV .8). No pericardial effusion. Thoracic aorta is normal course, 4 cm ascending aorta. Mild pulmonary venous congestion. MEDIASTINUM/NODES: No lymphadenopathy by CT size criteria. LUNGS/PLEURA: Tracheobronchial tree is patent, no pneumothorax. No pleural effusions, focal consolidations, pulmonary nodules or masses. Bilateral lower lobe atelectasis. Punctate LEFT upper lobe subpleural granuloma. UPPER ABDOMEN: Included view of the abdomen is unremarkable. MUSCULOSKELETAL: Nonacute. Moderate mid and lower thoracic ventral endplate spurring. Review of the MIP images confirms the above findings. IMPRESSION: 1. Bilateral acute pulmonary emboli, occlusive within multiple segmental and subsegmental branches. No RIGHT heart strain. 2. Mild cardiomegaly and pulmonary venous congestion. 3. **An incidental finding of potential clinical significance has been found. 4 cm aneurysmal  ascending aorta. Recommend annual imaging followup by CTA or MRA. This recommendation follows 2010 ACCF/AHA/AATS/ACR/ASA/SCA/SCAI/SIR/STS/SVM Guidelines for the Diagnosis and Management of Patients with Thoracic Aortic Disease. Circulation. 2010; 121: Z610-R604** 4. Critical Value/emergent results were discussed by Dr. Oleta Mouse, radiology at the time of interpretation on 03/27/2018 at 5:00 pm to Dr. Donalee Citrin , who verbally acknowledged these results. Electronically Signed   By: Awilda Metro M.D.   On: 03/27/2018 17:04   Ct Cervical Spine Wo Contrast  Result Date: 03/22/2018 CLINICAL DATA:  Right-sided numbness and tingling since 2016. Patient fell on Monday. Myelopathy. EXAM: CT CERVICAL SPINE WITHOUT CONTRAST TECHNIQUE: Multidetector CT imaging of the cervical spine was performed without intravenous contrast. Multiplanar CT image reconstructions were also generated. COMPARISON:  Same day MRI FINDINGS: Alignment: Slight reversal cervical lordosis. Intact craniocervical relationship and atlantodental interval. No listhesis. Skull base and vertebrae: The vertebral body heights are maintained without evidence of acute fracture. No suspicious osseous lesions. Soft tissues and spinal canal: C3 through C6 ossification of the posterior longitudinal ligament most prominent posterior to C3 and C4 markedly the narrowing the lumen of the central canal. This is most evident posterior to C3-4 where there is left  central ossification occupying up to 75% of the AP dimension on the left. Disc levels: C2-C3: Left paracentral disc protrusion impressing upon the ventral aspect of the thecal sac. Moderate left-sided spinal stenosis. Patent neural foramina. C3-C4: Moderate disc flattening with left paracentral ossification of the posterior longitudinal ligament impressing upon the thecal sac and cord on the left up to 50% canal stenosis due to the ossified longitudinal ligament on the left. Bilateral uncovertebral joint  hypertrophy with uncinate spurring. Mild right and moderate left foraminal stenosis. C4-C5: Prominent left broad based central ossified posterior longitudinal ligament with underlying diffuse disc bulge and uncovertebral joint osteoarthritis. Severe spinal stenosis up to 50% narrowing the AP dimension. Moderate left L5 foraminal stenosis. C5-C6: Central to left central disc herniation with partially calcified disc impressing upon the thecal sac. Resultant mild spinal stenosis. Slight bilateral foraminal encroachment from uncinate spurring. C6-C7: Negative without significant disc herniation identified. Shoulder artifacts limit assessment. C7-T1: Negative given limitations of streak artifacts from the patient's shoulders. Upper chest: Negative. 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 No acute cervical spine fracture. Electronically Signed   By: Tollie Eth M.D.   On: 03/22/2018 21:41   Mr Cervical Spine Wo Contrast  Result Date: 03/22/2018 CLINICAL DATA:  Initial evaluation for acute right upper and lower extremity weakness, fall. EXAM: MRI CERVICAL AND LUMBAR SPINE WITHOUT CONTRAST TECHNIQUE: Multiplanar and multiecho pulse sequences of the cervical spine, to include the craniocervical junction and cervicothoracic junction, and lumbar spine, were obtained without intravenous contrast. COMPARISON:  None. FINDINGS: MRI CERVICAL SPINE FINDINGS Alignment: Straightening with slight reversal of the normal cervical lordosis. No listhesis. Vertebrae: Vertebral body heights are maintained without evidence for acute or chronic fracture. No definite fracture seen within the cervical spine status post recent trauma. Bone marrow signal intensity diffusely decreased on T1 weighted imaging, suspected to be related to body habitus. No discrete or worrisome osseous lesions. Mild reactive edema about the C3-4 interspace  favored to be degenerative in nature. Cord: Linear T1/T2 hypointensity within the left ventral epidural space extending from C3 through C5 favored to reflect ossification of the posterior longitudinal ligament. Resultant severe spinal stenosis at C3-4 through C5-6. Abnormal T2 signal within the cervical spinal cord extending from C3 through C6 consistent with cord contusion/edema. Changes more pronounced at the right aspect of the cord. Possible small focus of associated hemorrhage at the level of C5-6 (series 6, image 8). Posterior Fossa, vertebral arteries, paraspinal tissues: Visualized brain and posterior fossa within normal limits. Craniocervical junction normal. Paraspinous and prevertebral soft tissues within normal limits. No findings to suggest acute ligamentous injury. Normal intravascular flow voids present within the vertebral arteries bilaterally. Disc levels: Underlying congenital spinal stenosis. C2-C3: Left paracentral disc protrusion indents the ventral thecal sac, flattening the left hemi cord. No cord signal changes. Moderate left-sided spinal stenosis. Neural foramina remain patent. C3-C4: Left paracentral osseous spur/ossification of the posterior longitudinal ligament. Underlying mild diffuse disc bulge with bilateral uncovertebral hypertrophy. Resultant severe spinal stenosis with spinal cord compression, most notable on the left. Thecal sac measures 3 mm in AP diameter at its most narrow point. Abnormal cord edema. Mild right with moderate left C4 foraminal stenosis. C4-C5: Left paracentral osseous spurs/ossification of the posterior longitudinal ligament. Underlying mild diffuse disc bulge with uncovertebral hypertrophy. Severe spinal stenosis with compression of the cervical spinal cord. Abnormal cord signal intensity. Moderate left C5 foraminal stenosis. C5-C6: Diffuse disc bulge with bilateral  uncovertebral hypertrophy. Bulging disc slightly asymmetric to the left. Resultant mild spinal  stenosis with bilateral foraminal narrowing. C6-C7: Mild disc bulge with uncovertebral hypertrophy. No significant stenosis. C7-T1:  Unremarkable. Visualized upper thoracic spine demonstrates no significant finding. MRI LUMBAR SPINE FINDINGS Segmentation: Normal segmentation. Lowest well-formed disc labeled the L5-S1 level. Alignment: Mild scoliosis. Vertebral bodies otherwise normally aligned with preservation of the normal lumbar lordosis. No listhesis. Vertebrae: Vertebral body heights maintained without evidence for acute or chronic fracture. Underlying bone marrow signal intensity diffusely decreased on T1 weighted imaging, suspected to be related to body habitus. No discrete or worrisome osseous lesions. Reactive endplate changes with associated Schmorl's nodes present about the L4-5 and L5-S1 interspaces. Conus medullaris and cauda equina: Conus extends to the L2 level. Conus and cauda equina appear normal. Paraspinal and other soft tissues: Paraspinous soft tissues demonstrate no acute finding. Visualized visceral structures grossly unremarkable. Disc levels: Mild diffuse congenital shortening of the pedicles. L1-2:  Unremarkable. L2-3: Mild diffuse disc bulge. Superimposed shallow right foraminal disc protrusion contacts the exiting right L2 nerve root. Mild facet ligament flavum hypertrophy. No significant spinal stenosis. Mild right neural foraminal narrowing. L3-4: Mild diffuse disc bulge with intervertebral disc space narrowing and disc desiccation. Mild to moderate facet ligament flavum hypertrophy. Short pedicles with resultant mild spinal stenosis. Mild bilateral L3 foraminal narrowing. L4-5: Mild diffuse disc bulge with intervertebral disc space narrowing. Chronic reactive endplate changes with marginal endplate spurring. Mild facet ligament flavum hypertrophy. Mild epidural lipomatosis. Short pedicles with resultant mild canal and bilateral L4 foraminal narrowing. L5-S1: Broad posterior disc bulge  with intervertebral disc space narrowing and disc desiccation. Chronic reactive endplate changes. Moderate right with mild left facet hypertrophy. Epidural lipomatosis. No significant stenosis. 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 Abnormal cord signal extending from C3 through C6 consistent with cord edema/contusion. Neuro surgical consultation recommended. MRI LUMBAR SPINE IMPRESSION 1. No acute abnormality within is the lumbar spine. 2. Shallow right foraminal disc protrusion at L2-3, contacting and potentially irritating the exiting right L2 nerve root. 3. 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 4. Prominent right-sided facet hypertrophy at L5-S1, which could contribute to lower back pain. Findings communicated to Dr. Erma HeritageIsaacs at 6:47 p.m. on 03/22/2018. Electronically Signed   By: Rise MuBenjamin  McClintock M.D.   On: 03/22/2018 18:51   Mr Lumbar Spine Wo Contrast  Result Date: 03/22/2018 CLINICAL DATA:  Initial evaluation for acute right upper and lower extremity weakness, fall. EXAM: MRI CERVICAL AND LUMBAR SPINE WITHOUT CONTRAST TECHNIQUE: Multiplanar and multiecho pulse sequences of the cervical spine, to include the craniocervical junction and cervicothoracic junction, and lumbar spine, were obtained without intravenous contrast. COMPARISON:  None. FINDINGS: MRI CERVICAL SPINE FINDINGS Alignment: Straightening with slight reversal of the normal cervical lordosis. No listhesis. Vertebrae: Vertebral body heights are maintained without evidence for acute or chronic fracture. No definite fracture seen within the cervical spine status post recent trauma. Bone marrow signal intensity diffusely decreased on T1 weighted imaging, suspected to be related to body habitus. No discrete or worrisome osseous lesions. Mild reactive edema about the C3-4 interspace  favored to be degenerative in nature. Cord: Linear T1/T2 hypointensity within the left ventral epidural space extending from C3 through C5 favored to reflect ossification of the posterior longitudinal ligament. Resultant severe spinal stenosis at C3-4 through C5-6. Abnormal T2 signal within the cervical spinal cord extending from C3  through C6 consistent with cord contusion/edema. Changes more pronounced at the right aspect of the cord. Possible small focus of associated hemorrhage at the level of C5-6 (series 6, image 8). Posterior Fossa, vertebral arteries, paraspinal tissues: Visualized brain and posterior fossa within normal limits. Craniocervical junction normal. Paraspinous and prevertebral soft tissues within normal limits. No findings to suggest acute ligamentous injury. Normal intravascular flow voids present within the vertebral arteries bilaterally. Disc levels: Underlying congenital spinal stenosis. C2-C3: Left paracentral disc protrusion indents the ventral thecal sac, flattening the left hemi cord. No cord signal changes. Moderate left-sided spinal stenosis. Neural foramina remain patent. C3-C4: Left paracentral osseous spur/ossification of the posterior longitudinal ligament. Underlying mild diffuse disc bulge with bilateral uncovertebral hypertrophy. Resultant severe spinal stenosis with spinal cord compression, most notable on the left. Thecal sac measures 3 mm in AP diameter at its most narrow point. Abnormal cord edema. Mild right with moderate left C4 foraminal stenosis. C4-C5: Left paracentral osseous spurs/ossification of the posterior longitudinal ligament. Underlying mild diffuse disc bulge with uncovertebral hypertrophy. Severe spinal stenosis with compression of the cervical spinal cord. Abnormal cord signal intensity. Moderate left C5 foraminal stenosis. C5-C6: Diffuse disc bulge with bilateral uncovertebral hypertrophy. Bulging disc slightly asymmetric to the left. Resultant mild spinal  stenosis with bilateral foraminal narrowing. C6-C7: Mild disc bulge with uncovertebral hypertrophy. No significant stenosis. C7-T1:  Unremarkable. Visualized upper thoracic spine demonstrates no significant finding. MRI LUMBAR SPINE FINDINGS Segmentation: Normal segmentation. Lowest well-formed disc labeled the L5-S1 level. Alignment: Mild scoliosis. Vertebral bodies otherwise normally aligned with preservation of the normal lumbar lordosis. No listhesis. Vertebrae: Vertebral body heights maintained without evidence for acute or chronic fracture. Underlying bone marrow signal intensity diffusely decreased on T1 weighted imaging, suspected to be related to body habitus. No discrete or worrisome osseous lesions. Reactive endplate changes with associated Schmorl's nodes present about the L4-5 and L5-S1 interspaces. Conus medullaris and cauda equina: Conus extends to the L2 level. Conus and cauda equina appear normal. Paraspinal and other soft tissues: Paraspinous soft tissues demonstrate no acute finding. Visualized visceral structures grossly unremarkable. Disc levels: Mild diffuse congenital shortening of the pedicles. L1-2:  Unremarkable. L2-3: Mild diffuse disc bulge. Superimposed shallow right foraminal disc protrusion contacts the exiting right L2 nerve root. Mild facet ligament flavum hypertrophy. No significant spinal stenosis. Mild right neural foraminal narrowing. L3-4: Mild diffuse disc bulge with intervertebral disc space narrowing and disc desiccation. Mild to moderate facet ligament flavum hypertrophy. Short pedicles with resultant mild spinal stenosis. Mild bilateral L3 foraminal narrowing. L4-5: Mild diffuse disc bulge with intervertebral disc space narrowing. Chronic reactive endplate changes with marginal endplate spurring. Mild facet ligament flavum hypertrophy. Mild epidural lipomatosis. Short pedicles with resultant mild canal and bilateral L4 foraminal narrowing. L5-S1: Broad posterior disc bulge  with intervertebral disc space narrowing and disc desiccation. Chronic reactive endplate changes. Moderate right with mild left facet hypertrophy. Epidural lipomatosis. No significant stenosis. 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 Abnormal cord signal extending from C3 through C6 consistent with cord edema/contusion. Neuro surgical consultation recommended. MRI LUMBAR SPINE IMPRESSION 1. No acute abnormality within is the lumbar spine. 2. Shallow right foraminal disc protrusion at L2-3, contacting and potentially irritating the exiting right L2 nerve root. 3. 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 4. Prominent right-sided facet hypertrophy at L5-S1, which could contribute to lower back pain. Findings communicated to  Dr. Erma Heritage at 6:47 p.m. on 03/22/2018. Electronically Signed   By: Rise Mu M.D.   On: 03/22/2018 18:51   Dg Chest Port 1 View  Result Date: 03/25/2018 CLINICAL DATA:  Short of breath with chest pain. EXAM: PORTABLE CHEST 1 VIEW COMPARISON:  None. FINDINGS: Support apparatus projects over the apices. Mildly degraded exam due to AP portable technique and patient body habitus. Patient rotated to the right. Normal heart size for level of inspiration. No pleural effusion or pneumothorax. Clear lungs. IMPRESSION: No acute cardiopulmonary disease. Electronically Signed   By: Jeronimo Greaves M.D.   On: 03/25/2018 23:21   Dg C-arm 1-60 Min  Result Date: 03/24/2018 CLINICAL DATA:  Cervical fusion. EXAM: DG CERVICAL SPINE - 1 VIEW; DG C-ARM 61-120 MIN COMPARISON:  Cervical spine CT 03/22/2018. FINDINGS: 2 intraoperative spot fluoro films are submitted. Images show C4 corpectomy with cage placement. Anterior cervical plate extends from C3 to C5. Fine bone detail not well demonstrated due to fluoroscopy. IMPRESSION: Intraoperative  assessment during C4 corpectomy and anterior fusion from C3-C5. Electronically Signed   By: Kennith Center M.D.   On: 03/24/2018 12:16    Labs:  CBC: Recent Labs    03/22/18 2120 03/25/18 2258 03/28/18 0129  WBC 6.4 9.1 8.0  HGB 13.8 13.7 14.2  HCT 43.9 43.5 43.9  PLT 173 122* 106*    COAGS: Recent Labs    03/28/18 0722  INR 1.32    BMP: Recent Labs    03/22/18 2120 03/25/18 2258  NA 141 139  K 3.7 4.0  CL 106 107  CO2 24 24  GLUCOSE 84 159*  BUN 10 24*  CALCIUM 9.2 8.8*  CREATININE 1.08 1.14  GFRNONAA >60 >60  GFRAA >60 >60    LIVER FUNCTION TESTS: No results for input(s): BILITOT, AST, ALT, ALKPHOS, PROT, ALBUMIN in the last 8760 hours.  TUMOR MARKERS: No results for input(s): AFPTM, CEA, CA199, CHROMGRNA in the last 8760 hours.  Assessment and Plan: Bilateral DVT/bilateral PE - IR consulted by neurosurgery for removable IVC filter placement due to patient failing anticoagulation with IV heparin resulting in bleeding from surgical site. Dr. Loreta Ave amenable to proceed with placement today.   Risks and benefits discussed with the patient including, but not limited to bleeding, infection, contrast induced renal failure, filter fracture or migration which can lead to emergency surgery or even death, strut penetration with damage or irritation to adjacent structures and caval thrombosis.  All of the patient's questions were answered, patient is agreeable to proceed. Consent signed and in chart.   Thank you for this interesting consult.  I greatly enjoyed meeting [name] [name] and look forward to participating in their care.  A copy of this report was sent to the requesting provider on this date.  Electronically Signed: Villa Herb, PA-C 03/28/2018, 9:30 AM   I spent a total of 40 Minutes  in face to face in clinical consultation, greater than 50% of which was counseling/coordinating care for IVC filter placement.

## 2018-03-28 NOTE — Progress Notes (Signed)
Subjective:  Neck hurts and collar feels tight   Objective:  Vitals:   03/27/18 1600 03/27/18 1954 03/27/18 2226 03/28/18 0300  BP:  119/74 123/73 (!) 156/99  Pulse: (!) 48 65 (!) 51 (!) 43  Resp: 17 13 20 15   Temp:  97.9 F (36.6 C) (!) 97.5 F (36.4 C) 98 F (36.7 C)  TempSrc:  Oral Oral Oral  SpO2: 100% 100% 100% 100%  Weight:      Height:        Intake/Output from previous day:  Intake/Output Summary (Last 24 hours) at 03/28/2018 [id number] Last data filed at 03/28/2018 0300 Gross per 24 hour  Intake 360.05 ml  Output 1350 ml  Net -989.95 ml    Physical Exam: Anxious  Obese black male  Neck collar on  Neck supple with no adenopathy JVP normal no bruits no thyromegaly Lungs clear with no wheezing and good diaphragmatic motion Heart:  S1/S2 no murmur, no rub, gallop or click PMI normal Abdomen: benighn, BS positve, no tenderness, no AAA no bruit.  No HSM or HJR Distal pulses intact with no bruits Plus one bilateral edema Neuro non-focal Skin warm and dry No muscular weakness   Lab Results: Basic Metabolic Panel: Recent Labs    03/25/18 2258  NA 139  K 4.0  CL 107  CO2 24  GLUCOSE 159*  BUN 24*  CREATININE 1.14  CALCIUM 8.8*   CBC: Recent Labs    03/25/18 2258 03/28/18 0129  WBC 9.1 8.0  NEUTROABS 7.8*  --   HGB 13.7 14.2  HCT 43.5 43.9  MCV 92.9 89.4  PLT 122* 106*    Imaging: Ct Angio Chest Pe W Or Wo Contrast  Result Date: 03/27/2018 CLINICAL DATA:  Chest pain, recent surgery, DVT. EXAM: CT ANGIOGRAPHY CHEST WITH CONTRAST TECHNIQUE: Multidetector CT imaging of the chest was performed using the standard protocol during bolus administration of intravenous contrast. Multiplanar CT image reconstructions and MIPs were obtained to evaluate the vascular anatomy. CONTRAST:  100mL ISOVUE-370 IOPAMIDOL (ISOVUE-370) INJECTION 76% COMPARISON:  Chest radiograph March 25, 2018. FINDINGS: CARDIOVASCULAR: Adequate contrast opacification of the pulmonary  artery's. Main pulmonary artery is not enlarged. RIGHT upper lobar central pulmonary embolism, occlusive within segmental branches. Occlusive RIGHT middle lobe subsegmental pulmonary emboli. Occlusive segmental and subsegmental RIGHT lower lobe pulmonary emboli. Central nonocclusive filling defect LEFT main pulmonary artery casting into the lobar and segmental branches. Heart size is mildly enlarged, no right heart strain (RV/LV .8). No pericardial effusion. Thoracic aorta is normal course, 4 cm ascending aorta. Mild pulmonary venous congestion. MEDIASTINUM/NODES: No lymphadenopathy by CT size criteria. LUNGS/PLEURA: Tracheobronchial tree is patent, no pneumothorax. No pleural effusions, focal consolidations, pulmonary nodules or masses. Bilateral lower lobe atelectasis. Punctate LEFT upper lobe subpleural granuloma. UPPER ABDOMEN: Included view of the abdomen is unremarkable. MUSCULOSKELETAL: Nonacute. Moderate mid and lower thoracic ventral endplate spurring. Review of the MIP images confirms the above findings. IMPRESSION: 1. Bilateral acute pulmonary emboli, occlusive within multiple segmental and subsegmental branches. No RIGHT heart strain. 2. Mild cardiomegaly and pulmonary venous congestion. 3. **An incidental finding of potential clinical significance has been found. 4 cm aneurysmal ascending aorta. Recommend annual imaging followup by CTA or MRA. This recommendation follows 2010 ACCF/AHA/AATS/ACR/ASA/SCA/SCAI/SIR/STS/SVM Guidelines for the Diagnosis and Management of Patients with Thoracic Aortic Disease. Circulation. 2010; 121: J191-Y782: [id number]** 4. Critical Value/emergent results were discussed by Dr. Oleta Mousermond, radiology at the time of interpretation on 03/27/2018 at 5:00 pm to Dr. Donalee CitrinGARY CRAM , who verbally  acknowledged these results. Electronically Signed   By: Awilda Metro M.D.   On: 03/27/2018 17:04    Cardiac Studies:  ECG:  NSR no acute changes    Telemetry:  NSR no arrhythmia   Echo: EF 50-55% no  obvious cor pulmonale   Medications:   . baclofen  10 mg Oral BID  . Chlorhexidine Gluconate Cloth  6 each Topical Once  . dexamethasone  10 mg Intravenous Q6H  . docusate sodium  100 mg Oral BID  . pantoprazole  40 mg Oral Daily  . sodium chloride flush  3 mL Intravenous Q12H  . sodium chloride flush  3 mL Intravenous Q12H     . sodium chloride    . sodium chloride Stopped (03/26/18 1301)  . 0.9 % NaCl with KCl 20 mEq / L      Assessment/Plan:   Neuro:  Had bleeding this am from surgical sight heparin d/c Dr Wynetta Emery has postponed 2nd posterior stabilizing surgery DVT/PE:  Bleeding from surgical sight on heparin with no bolus. IVC filter today and possible re-challenge with DOAC in  48 hours TTE with good EF and no cor pulmonale No airway compromise at this time Heparin should be out of system in 2-4 hours   [name] [name] 03/28/2018, 8:21 AM

## 2018-03-28 NOTE — Anesthesia Postprocedure Evaluation (Signed)
Anesthesia Post Note  Patient: Devlon Hagenow  Procedure(s) Performed: Reexploration of anterior cervical wound (N/A )     Patient location during evaluation: PACU Anesthesia Type: General Level of consciousness: awake and alert, oriented and patient cooperative Pain management: pain level controlled Vital Signs Assessment: post-procedure vital signs reviewed and stable Respiratory status: spontaneous breathing, nonlabored ventilation, respiratory function stable and patient connected to nasal cannula oxygen Cardiovascular status: blood pressure returned to baseline and stable Postop Assessment: no apparent nausea or vomiting Anesthetic complications: no    Last Vitals:  Vitals:   03/28/18 1140 03/28/18 1155  BP: (!) 128/100 133/77  Pulse: (!) 52 (!) 53  Resp: (!) 9 13  Temp:    SpO2: (!) 89% 99%    Last Pain:  Vitals:   03/28/18 1155  TempSrc:   PainSc: 0-No pain                 Saniyah Mondesir,E. Ignazio Kincaid

## 2018-03-28 NOTE — Progress Notes (Signed)
ANTICOAGULATION CONSULT NOTE  Pharmacy Consult for heparin Indication: pulmonary embolus  Allergies  Allergen Reactions  . Nsaids Other (See Comments)    G6PD  . Sulfa Antibiotics Other (See Comments)    G6PD    Patient Measurements: Height: 5\' 9"  (175.3 cm) Weight: (!) 306 lb 10.6 oz (139.1 kg) IBW/kg (Calculated) : 70.7 Heparin Dosing Weight: 103 kg   Vital Signs: Temp: 97.5 F (36.4 C) (07/15 2226) Temp Source: Oral (07/15 2226) BP: 123/73 (07/15 2226) Pulse Rate: 51 (07/15 2226)  Labs: Recent Labs    03/25/18 2258 03/28/18 0129  HGB 13.7 14.2  HCT 43.5 43.9  PLT 122* PENDING  HEPARINUNFRC  --  0.98*  CREATININE 1.14  --     Estimated Creatinine Clearance: 105.2 mL/min (by C-G formula based on SCr of 1.14 mg/dL).   Medical History: Past Medical History:  Diagnosis Date  . Degenerative disc disease, lumbar   . Gout   . Hypertension     Medications:  Medications Prior to Admission  Medication Sig Dispense Refill Last Dose  . naproxen sodium (ALEVE) 220 MG tablet Take 220 mg by mouth 2 (two) times daily as needed (pain).   prn at unk    Assessment: [age] YOM who was admitted for staged spinal surgery now with new bilateral pulmonary embolus on CT along with bilateral distal DVTs. Of note, CT showed an incidental finding of a 4 cm aneurysmal ascending aorta. Pharmacy consulted to start IV heparin WITHOUT bolus. H/H wnl. Plt low. Surgery has been postponed  Initial heparin level 0.98 units/ml  Goal of Therapy:  Heparin level 0.3-0.7 units/ml Monitor platelets by anticoagulation protocol: Yes   Plan:  -Decrease heparin drip to 1600 units/hr -F/u 6 hr HL -Monitor daily CBC, HL and s/s of bleeding   Thanks for allowing pharmacy to be a part of this patient's care.  Talbert CageLora Chaslyn Eisen, PharmD Clinical Pharmacist

## 2018-03-28 NOTE — Op Note (Signed)
Preoperative diagnosis: Anterior cervical hematoma following anticoagulation subsequent to anterior cervical corpectomy  Postoperative diagnosis: Same  Procedure: Reexploration of anterior cervical wound for evacuation of anterior cervical hematoma.  Surgeon: Jillyn Hiddengary Malakhai Beitler  Assistant: Dr. Lisbeth RenshawNeelesh Nundkumar  Anesthesia: Gen. EBL: Minimal. History of present illness:  History of present illness:patient is a very pleasant 52 year old gentleman has had previous anterior cervical corpectomy on Friday was diagnosed with pulmonary emboli and DVTs on postoperative day 3 and was adequately. This morning started oozing around his incision patient's heparin was discontinued protamine was given however he continued to ooze and started developing a soft tissue hematoma. Due to progression and the failure of stopping I recommended anterior cervical reexploration of wound for evacuation of hematoma. I extensively went over the risks and benefits of the operation with him as well as perioperative course expectations of outcome and alternatives of surgery he understood and agreed to proceed forward.  Operative procedure: Patient brought into the or was induced under general anesthesia positioned supine the neck in slight extension in 5 pounds of halter traction the rest of his neck prepped and draped in routine sterile fashion. Is old incision was opened up and immediately under pressure came aggressive amount of liquid blood. The platysma and it was a large amount of liquid hematoma in the prevertebral space. There is no active blood vessel oozing there was some oozing coming from the longus which was coagulated with bipolar cautery utilizing Surgifoam and irrigation and bipolar cautery to close in a stasis was maintained. And after adequate meticulous hemostasis was maintained placed a 7 mm JP drain and closed the wound in layers with interrupted Vicryl in the platysma and a running 4 subcuticular in the skin Dermabond  benzo and Steri-Strips and a sterile dressing was applied patient recovered in stable condition. At the end the case all needle counts sponge counts were correct.

## 2018-03-28 NOTE — Procedures (Signed)
Interventional Radiology Procedure Note  Procedure: Placement of IVC filter via right CFV approach.  Complications: None Recommendations:  - Ok to shower tomorrow - Do not submerge for 7 days - Routine care  - When candidate for removal, VIR can see in clinic for retrieval  Signed,  Yvone NeuJaime S. Loreta AveWagner, DO

## 2018-03-28 NOTE — Anesthesia Procedure Notes (Signed)
Procedure Name: Intubation Date/Time: 03/28/2018 10:09 AM Performed by: Lanell MatarBaker, Bushra Denman M, CRNA Pre-anesthesia Checklist: Patient identified, Emergency Drugs available, Suction available and Patient being monitored Patient Re-evaluated:Patient Re-evaluated prior to induction Oxygen Delivery Method: Circle System Utilized Preoxygenation: Pre-oxygenation with 100% oxygen Induction Type: IV induction Ventilation: Mask ventilation without difficulty Laryngoscope Size: Glidescope and 3 Grade View: Grade II Tube type: Oral Tube size: 7.5 mm Number of attempts: 1 Airway Equipment and Method: Stylet and Video-laryngoscopy Placement Confirmation: ETT inserted through vocal cords under direct vision,  positive ETCO2 and breath sounds checked- equal and bilateral Secured at: 23 cm Tube secured with: Tape Dental Injury: Teeth and Oropharynx as per pre-operative assessment

## 2018-03-28 NOTE — Care Management Note (Signed)
Case Management Note  Patient Details  Name: [name] [name] MRN: [id number] Date of Birth: 17-Jul-1966  Subjective/Objective:  Patient is a 52 y/o male with PMH of HTN, DDD lumbar spine and gout.  He was admitted after a fall at home with inability to get up.  Reports one other recent fall when his dog pulled him over.  States recent R hand numbness,tingling and imbalance.  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  PTA, pt independent, and living alone in extended stay hotel.                    Action/Plan: PT/OT recommending CIR; he plans to dc home with parents at discharge.  Noted complications of last 24h with emergent surgery and bilateral PE/DVT.  Will follow for discharge planning as pt progresses.  Expected Discharge Date:                  Expected Discharge Plan:  IP Rehab Facility  In-House Referral:     Discharge planning Services  CM Consult  Post Acute Care Choice:    Choice offered to:     DME Arranged:    DME Agency:     HH Arranged:    HH Agency:     Status of Service:  In process, will continue to follow  If discussed at Long Length of Stay Meetings, dates discussed:    Additional Comments:  Quintella BatonJulie W. Sherryll Skoczylas, RN, BSN  Trauma/Neuro ICU Case Manager [phone]

## 2018-03-28 NOTE — Anesthesia Preprocedure Evaluation (Addendum)
Anesthesia Evaluation  Patient identified by MRN, date of birth, ID band Patient awake    Reviewed: Allergy & Precautions, NPO status , Patient's Chart, lab work & pertinent test results, reviewed documented beta blocker date and time   History of Anesthesia Complications Negative for: history of anesthetic complications  Airway Mallampati: III  TM Distance: >3 FB Neck ROM: Limited   Comment: Expanding neck hematoma Dental  (+) Teeth Intact, Dental Advisory Given   Pulmonary shortness of breath, PE   breath sounds clear to auscultation       Cardiovascular hypertension (no meds), + DVT (03/27/18 Bilateral lower extremities are positive for acute deep vein thrombosis involving bilateral posterior tibial and peroneal veins)   Rhythm:Regular Rate:Normal  03/27/18 ECHO: EF 50-55%, valves OK   Neuro/Psych S/p Anterior cervical corpectomy 03/24/18    GI/Hepatic negative GI ROS, Neg liver ROS,   Endo/Other  Morbid obesity  Renal/GU negative Renal ROS     Musculoskeletal   Abdominal (+) + obese,   Peds  Hematology plt 106k Heparin infusion stopped   Anesthesia Other Findings   Reproductive/Obstetrics                           Anesthesia Physical Anesthesia Plan  ASA: III and emergent  Anesthesia Plan: General   Post-op Pain Management:    Induction: Intravenous and Rapid sequence  PONV Risk Score and Plan: 2 and Ondansetron, Dexamethasone and Treatment may vary due to age or medical condition  Airway Management Planned: Oral ETT and Video Laryngoscope Planned  Additional Equipment:   Intra-op Plan:   Post-operative Plan: Possible Post-op intubation/ventilation  Informed Consent: I have reviewed the patients History and Physical, chart, labs and discussed the procedure including the risks, benefits and alternatives for the proposed anesthesia with the patient or authorized representative who  has indicated his/her understanding and acceptance.   Dental advisory given  Plan Discussed with: CRNA and Surgeon  Anesthesia Plan Comments:        Anesthesia Quick Evaluation

## 2018-03-29 ENCOUNTER — Encounter (HOSPITAL_COMMUNITY): Payer: Self-pay | Admitting: Neurosurgery

## 2018-03-29 DIAGNOSIS — I2699 Other pulmonary embolism without acute cor pulmonale: Secondary | ICD-10-CM

## 2018-03-29 DIAGNOSIS — I2601 Septic pulmonary embolism with acute cor pulmonale: Secondary | ICD-10-CM

## 2018-03-29 LAB — CBC
HCT: 40.5 % (ref 39.0–52.0)
Hemoglobin: 13 g/dL (ref 13.0–17.0)
MCH: 28.7 pg (ref 26.0–34.0)
MCHC: 32.1 g/dL (ref 30.0–36.0)
MCV: 89.4 fL (ref 78.0–100.0)
Platelets: 87 10*3/uL — ABNORMAL LOW (ref 150–400)
RBC: 4.53 MIL/uL (ref 4.22–5.81)
RDW: 12.2 % (ref 11.5–15.5)
WBC: 8.4 10*3/uL (ref 4.0–10.5)

## 2018-03-29 MED ORDER — MAGNESIUM CITRATE PO SOLN
1.0000 | Freq: Once | ORAL | Status: AC
  Administered 2018-03-29: 1 via ORAL
  Filled 2018-03-29: qty 296

## 2018-03-29 MED ORDER — WARFARIN - PHYSICIAN DOSING INPATIENT: Freq: Every day | Status: DC

## 2018-03-29 MED ORDER — ZOLPIDEM TARTRATE 5 MG PO TABS
5.0000 mg | ORAL_TABLET | Freq: Every evening | ORAL | Status: DC | PRN
  Administered 2018-03-29: 5 mg via ORAL
  Filled 2018-03-29: qty 1

## 2018-03-29 MED ORDER — WARFARIN SODIUM 5 MG PO TABS
10.0000 mg | ORAL_TABLET | Freq: Once | ORAL | Status: AC
  Administered 2018-03-29: 10 mg via ORAL
  Filled 2018-03-29: qty 2

## 2018-03-29 MED ORDER — LOSARTAN POTASSIUM 50 MG PO TABS
25.0000 mg | ORAL_TABLET | Freq: Every day | ORAL | Status: DC
  Administered 2018-03-29 – 2018-04-11 (×11): 25 mg via ORAL
  Filled 2018-03-29 (×14): qty 1

## 2018-03-29 NOTE — Progress Notes (Signed)
ANTICOAGULATION CONSULT NOTE  Pharmacy Consult for heparin Indication: pulmonary embolus  Allergies  Allergen Reactions  . Nsaids Other (See Comments)    G6PD  . Sulfa Antibiotics Other (See Comments)    G6PD    Patient Measurements: Height: 5\' 9"  (175.3 cm) Weight: (!) 306 lb 10.6 oz (139.1 kg) IBW/kg (Calculated) : 70.7 Heparin Dosing Weight: 103 kg   Vital Signs: Temp: 97.6 F (36.4 C) (07/17 0400) Temp Source: Oral (07/17 0400) BP: 166/100 (07/17 0829) Pulse Rate: 58 (07/17 0829)  Labs: Recent Labs    03/28/18 0129 03/28/18 0722 03/29/18 0406  HGB 14.2  --  13.0  HCT 43.9  --  40.5  PLT 106*  --  87*  LABPROT  --  16.3*  --   INR  --  1.32  --   HEPARINUNFRC 0.98*  --   --     Estimated Creatinine Clearance: 105.2 mL/min (by C-G formula based on SCr of 1.14 mg/dL).   Medical History: Past Medical History:  Diagnosis Date  . Degenerative disc disease, lumbar   . Gout   . Hypertension     Medications:  Medications Prior to Admission  Medication Sig Dispense Refill Last Dose  . naproxen sodium (ALEVE) 220 MG tablet Take 220 mg by mouth 2 (two) times daily as needed (pain).   prn at unk    Assessment: [age] YOM who was admitted for staged spinal surgery now on heparin gtt for new bilateral pulmonary embolus on CT along with bilateral distal DVTs. Of note, CT showed an incidental finding of a 4 cm aneurysmal ascending aorta. (No boluses) Started bleeding from incisions 7/16 AM, heparin stopped and Dr. Wynetta Emeryram reversed with protamine. IVC placed 7/16 and plan to start Coumadin or Xarelto. INR 1.32. Plts decreased 106 to 87.  Goal of Therapy:  Heparin level 0.3-0.7 units/ml Monitor platelets by anticoagulation protocol: Yes   Plan:  Holding all anticoag F/U restart of anticoag for acute PE/DVTs. Consider restarting around 7/18 per Cards Monitor daily CBC and s/s of bleeding   Lenord Carboebecca Alannah Averhart, PharmD PGY1 Pharmacy Resident Phone number: [phone] 03/29/2018  8:43 AM

## 2018-03-29 NOTE — Consult Note (Signed)
Name: [name] [name] Tranchina MRN: [id number] DOB: 1966-02-25    ADMISSION DATE:  03/22/2018 CONSULTATION DATE: 03/29/2018  REFERRING MD : Neurosurgery  CHIEF COMPLAINT: Right-sided weakness  BRIEF PATIENT DESCRIPTION:  52 year old male no acute distress at rest  SIGNIFICANT EVENTS  03/24/2018 anterior cervical corpectomy of C4 03/28/2018 bilateral DVTs bilateral PEs initiation of heparin with acute bleeding at surgical site 03/28/2018 return to the OR for reexploration of surgical site  STUDIES:  Ct Angio Chest Pe W Or Wo Contrast  Result Date: 03/27/2018 CLINICAL DATA:  Chest pain, recent surgery, DVT. EXAM: CT ANGIOGRAPHY CHEST WITH CONTRAST TECHNIQUE: Multidetector CT imaging of the chest was performed using the standard protocol during bolus administration of intravenous contrast. Multiplanar CT image reconstructions and MIPs were obtained to evaluate the vascular anatomy. CONTRAST:  100mL ISOVUE-370 IOPAMIDOL (ISOVUE-370) INJECTION 76% COMPARISON:  Chest radiograph March 25, 2018. FINDINGS: CARDIOVASCULAR: Adequate contrast opacification of the pulmonary artery's. Main pulmonary artery is not enlarged. RIGHT upper lobar central pulmonary embolism, occlusive within segmental branches. Occlusive RIGHT middle lobe subsegmental pulmonary emboli. Occlusive segmental and subsegmental RIGHT lower lobe pulmonary emboli. Central nonocclusive filling defect LEFT main pulmonary artery casting into the lobar and segmental branches. Heart size is mildly enlarged, no right heart strain (RV/LV .8). No pericardial effusion. Thoracic aorta is normal course, 4 cm ascending aorta. Mild pulmonary venous congestion. MEDIASTINUM/NODES: No lymphadenopathy by CT size criteria. LUNGS/PLEURA: Tracheobronchial tree is patent, no pneumothorax. No pleural effusions, focal consolidations, pulmonary nodules or masses. Bilateral lower lobe atelectasis. Punctate LEFT upper lobe subpleural granuloma. UPPER ABDOMEN: Included view of  the abdomen is unremarkable. MUSCULOSKELETAL: Nonacute. Moderate mid and lower thoracic ventral endplate spurring. Review of the MIP images confirms the above findings. IMPRESSION: 1. Bilateral acute pulmonary emboli, occlusive within multiple segmental and subsegmental branches. No RIGHT heart strain. 2. Mild cardiomegaly and pulmonary venous congestion. 3. **An incidental finding of potential clinical significance has been found. 4 cm aneurysmal ascending aorta. Recommend annual imaging followup by CTA or MRA. This recommendation follows 2010 ACCF/AHA/AATS/ACR/ASA/SCA/SCAI/SIR/STS/SVM Guidelines for the Diagnosis and Management of Patients with Thoracic Aortic Disease. Circulation. 2010; 121: W098-J191: [id number]** 4. Critical Value/emergent results were discussed by Dr. Oleta Mousermond, radiology at the time of interpretation on 03/27/2018 at 5:00 pm to Dr. Donalee CitrinGARY CRAM , who verbally acknowledged these results. Electronically Signed   By: Awilda Metroourtnay  Bloomer M.D.   On: 03/27/2018 17:04   Ir Ivc Filter Plmt / S&i /img Guid/mod Sed  Result Date: 03/28/2018 INDICATION: 52 year old male with a history bilateral lower extremity DVT and pulmonary embolism, and hemorrhage on anti coagulation. He has been referred for IVC filter placement. EXAM: ULTRASOUND GUIDED ACCESS RIGHT COMMON FEMORAL VEIN CAVAGRAM IVC FILTER PLACEMENT MEDICATIONS: None. ANESTHESIA/SEDATION: None FLUOROSCOPY TIME:  Fluoroscopy Time: 0 minutes 46 seconds (153 mGy). COMPLICATIONS: None PROCEDURE: The procedure, risks, benefits, and alternatives were explained to the patient. Specific risks discussed include bleeding, infection, contrast reaction, renal failure, IVC filter fracture, migration, ileo caval thrombus (3% incidence), need for further procedure, need for further surgery, pulmonary embolism, cardiopulmonary collapse, death. Questions regarding the procedure were encouraged and answered. The patient understands and consents to the procedure. Ultrasound survey was  performed with images stored and sent to PACs. The right inguinal region was prepped with chlorhexidine in a sterile fashion, and a sterile drape was applied covering the operative field. A sterile gown and sterile gloves were used for the procedure. Local anesthesia was provided with 1% Lidocaine. A single wall needle was used access the right common  femoral vein under ultrasound. With excellent venous blood flow returned, in 035 wire passed through the needle into the IVC. Small incision was made with an 11 blade scalpel. The needle was removed. The delivery sheath for a retrievable Bard Denali filter was passed over the Bentson wire into the IVC. The wire was removed and small contrast was used to confirm IVC location. IVC cavagram performed. Dilator was removed, and the IVC filter was then delivered, positioned below the lowest renal vein, approximately L2 level. Repeat cavagram performed, and the catheter was removed. Manual pressure was used for hemostasis. Patient tolerated the procedure well and remained hemodynamically stable throughout. No complications were encountered and no significant blood loss was encounter. IMPRESSION: Status post placement of IVC filter via right common femoral approach. Signed, Yvone Neu. Reyne Dumas, RPVI Vascular and Interventional Radiology Specialists [facility] Radiology PLAN: This IVC filter is potentially retrievable. The patient will be assessed for filter retrieval by Interventional Radiology in approximately 8-12 weeks. Further recommendations regarding filter retrieval, continued surveillance or declaration of device permanence, will be made at that time. Electronically Signed   By: Gilmer Mor D.O.   On: 03/28/2018 17:42      HISTORY OF PRESENT ILLNESS:   Mr. Placeres is a 52 year old male with known degenerative disc disease and degenerative joint disease.  He was in his usual state of health which included right upper extremity lower extremity weakness and he fell  on 03/22/2018 and was transferred per EMS to [facility].  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 He was taken operating room per neurosurgery and had an anterior cervical corpectomy of C4 on 03/24/2018.  Following surgery is noted to have a syncopal episodes x2. Subsequent CTA of the chest revealed bilateral pulmonary embolism venous ultrasound revealed bilateral deep vein thrombosis.  He was started on heparin drip.  Unfortunately he had extreme bleeding from his surgical site and required reexploration on 03/29/2015.  Due to the fact he had bilateral DVTs bilateral PEs and could not tolerate anticoagulation interventional radiology was consulted and an IVC filter was placed on 03/28/2018 per Dr. Fransisco Beau. 03/29/2018 pulmonary critical care was consulted for care of IVC filter. Plan will be to return to the IR clinic within 6 weeks and have IVC filter removed at that time.  He will most likely require anticoagulation for another 6 months and an evaluation at that time. He does not have a past medical history of clot disorder.  No family history of anticoagulation disorders.  Most likely he is got DVT secondary to narcotics and decreased mobility.  At the time of this dictation he is awake alert no acute distress he does have right-sided weakness.  PAST MEDICAL HISTORY :   has a past medical history of Degenerative disc disease, lumbar, Gout, and Hypertension.  has a past surgical history that includes Tendon repair; Meniscus repair (Left); Anterior cervical corpectomy (N/A, 03/24/2018); IR IVC FILTER PLMT / S&I /IMG GUID/MOD SED (03/28/2018); and Anterior cervical decomp/discectomy fusion (N/A, 03/28/2018). Prior to Admission medications   Medication Sig Start Date End Date Taking? Authorizing Provider  naproxen sodium (ALEVE) 220 MG tablet Take 220 mg by mouth 2 (two) times daily as needed (pain).   Yes [provider]   Allergies  Allergen Reactions    . Nsaids Other (See Comments)    G6PD  . Sulfa Antibiotics Other (See Comments)    G6PD    FAMILY HISTORY:  family history is not on  file. SOCIAL HISTORY:  reports that he has never smoked. He has never used smokeless tobacco. He reports that he does not drink alcohol or use drugs.  REVIEW OF SYSTEMS:   10 point review of system taken, please see HPI for positives and negatives.   SUBJECTIVE:  No acute distress at rest.  VITAL SIGNS: Temp:  [97.6 F (36.4 C)-98.1 F (36.7 C)] 97.6 F (36.4 C) (07/17 0400) Pulse Rate:  [50-71] 58 (07/17 0829) Resp:  [9-23] 23 (07/17 0829) BP: (117-166)/(71-100) 166/100 (07/17 0829) SpO2:  [89 %-100 %] 100 % (07/17 0829)  PHYSICAL EXAMINATION: General: Well-nourished well-developed male no acute distress at rest Neuro: Right-sided weakness is noted HEENT: No JVD or lymphadenopathy is appreciated Cardiovascular: Heart sounds are regular regular rate and rhythm Lungs: Clear to auscultation Abdomen: Mild distention decreased bowel sounds Musculoskeletal: Intact Skin: Warm and dry  Recent Labs  Lab 03/22/18 2120 03/25/18 2258  NA 141 139  K 3.7 4.0  CL 106 107  CO2 24 24  BUN 10 24*  CREATININE 1.08 1.14  GLUCOSE 84 159*   Recent Labs  Lab 03/25/18 2258 03/28/18 0129 03/29/18 0406  HGB 13.7 14.2 13.0  HCT 43.5 43.9 40.5  WBC 9.1 8.0 8.4  PLT 122* 106* 87*   Ct Angio Chest Pe W Or Wo Contrast  Result Date: 03/27/2018 CLINICAL DATA:  Chest pain, recent surgery, DVT. EXAM: CT ANGIOGRAPHY CHEST WITH CONTRAST TECHNIQUE: Multidetector CT imaging of the chest was performed using the standard protocol during bolus administration of intravenous contrast. Multiplanar CT image reconstructions and MIPs were obtained to evaluate the vascular anatomy. CONTRAST:  ISOVUE-370 IOPAMIDOL (ISOVUE-370) INJECTION 76% COMPARISON:  Chest radiograph March 25, 2018. FINDINGS: CARDIOVASCULAR: Adequate contrast opacification of the pulmonary  artery's. Main pulmonary artery is not enlarged. RIGHT upper lobar central pulmonary embolism, occlusive within segmental branches. Occlusive RIGHT middle lobe subsegmental pulmonary emboli. Occlusive segmental and subsegmental RIGHT lower lobe pulmonary emboli. Central nonocclusive filling defect LEFT main pulmonary artery casting into the lobar and segmental branches. Heart size is mildly enlarged, no right heart strain (RV/LV .8). No pericardial effusion. Thoracic aorta is normal course, 4 cm ascending aorta. Mild pulmonary venous congestion. MEDIASTINUM/NODES: No lymphadenopathy by CT size criteria. LUNGS/PLEURA: Tracheobronchial tree is patent, no pneumothorax. No pleural effusions, focal consolidations, pulmonary nodules or masses. Bilateral lower lobe atelectasis. Punctate LEFT upper lobe subpleural granuloma. UPPER ABDOMEN: Included view of the abdomen is unremarkable. MUSCULOSKELETAL: Nonacute. Moderate mid and lower thoracic ventral endplate spurring. Review of the MIP images confirms the above findings. IMPRESSION: 1. Bilateral acute pulmonary emboli, occlusive within multiple segmental and subsegmental branches. No RIGHT heart strain. 2. Mild cardiomegaly and pulmonary venous congestion. 3. **An incidental finding of potential clinical significance has been found. 4 cm aneurysmal ascending aorta. Recommend annual imaging followup by CTA or MRA. This recommendation follows 2010 ACCF/AHA/AATS/ACR/ASA/SCA/SCAI/SIR/STS/SVM Guidelines for the Diagnosis and Management of Patients with Thoracic Aortic Disease. Circulation. 2010; 121: Z610-R604** 4. Critical Value/emergent results were discussed by Dr. Oleta Mouse, radiology at the time of interpretation on 03/27/2018 at 5:00 pm to Dr. Donalee Citrin , who verbally acknowledged these results. Electronically Signed   By: Awilda Metro M.D.   On: 03/27/2018 17:04   Ir Ivc Filter Plmt / S&i /img Guid/mod Sed  Result Date: 03/28/2018 INDICATION: 52 year old male with a  history bilateral lower extremity DVT and pulmonary embolism, and hemorrhage on anti coagulation. He has been referred for IVC filter placement. EXAM: ULTRASOUND GUIDED ACCESS RIGHT COMMON FEMORAL  VEIN CAVAGRAM IVC FILTER PLACEMENT MEDICATIONS: None. ANESTHESIA/SEDATION: None FLUOROSCOPY TIME:  Fluoroscopy Time: 0 minutes 46 seconds (153 mGy). COMPLICATIONS: None PROCEDURE: The procedure, risks, benefits, and alternatives were explained to the patient. Specific risks discussed include bleeding, infection, contrast reaction, renal failure, IVC filter fracture, migration, ileo caval thrombus (3% incidence), need for further procedure, need for further surgery, pulmonary embolism, cardiopulmonary collapse, death. Questions regarding the procedure were encouraged and answered. The patient understands and consents to the procedure. Ultrasound survey was performed with images stored and sent to PACs. The right inguinal region was prepped with chlorhexidine in a sterile fashion, and a sterile drape was applied covering the operative field. A sterile gown and sterile gloves were used for the procedure. Local anesthesia was provided with 1% Lidocaine. A single wall needle was used access the right common femoral vein under ultrasound. With excellent venous blood flow returned, in 035 wire passed through the needle into the IVC. Small incision was made with an 11 blade scalpel. The needle was removed. The delivery sheath for a retrievable Bard Denali filter was passed over the Bentson wire into the IVC. The wire was removed and small contrast was used to confirm IVC location. IVC cavagram performed. Dilator was removed, and the IVC filter was then delivered, positioned below the lowest renal vein, approximately L2 level. Repeat cavagram performed, and the catheter was removed. Manual pressure was used for hemostasis. Patient tolerated the procedure well and remained hemodynamically stable throughout. No complications were  encountered and no significant blood loss was encounter. IMPRESSION: Status post placement of IVC filter via right common femoral approach. Signed, Yvone Neu. Reyne Dumas, RPVI Vascular and Interventional Radiology Specialists [facility] Radiology PLAN: This IVC filter is potentially retrievable. The patient will be assessed for filter retrieval by Interventional Radiology in approximately 8-12 weeks. Further recommendations regarding filter retrieval, continued surveillance or declaration of device permanence, will be made at that time. Electronically Signed   By: Gilmer Mor D.O.   On: 03/28/2018 17:42    ASSESSMENT:  Active Problems:   Myelopathy (HCC)   Anterior cervical corpectomy of C4 7/12   Hemorrhage from surgical site  7/16 with reexploration cervical wound   Acute deep vein thrombosis (DVT) of right lower extremity (HCC)   Pulmonary embolism (HCC)bilateral, failed heparin IVC filter placed7/16   HTN Discussion: Mr. Caliendo is a 52 year old male with known degenerative disc disease and degenerative joint disease.  He was in his usual state of health which included right upper extremity lower extremity weakness and he fell on 03/22/2018 and was transferred per EMS to [facility].  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 He was taken operating room per neurosurgery and had an anterior cervical corpectomy of C4 on 03/24/2018.  Following surgery is noted to have a syncopal episodes x2. Subsequent CTA of the chest revealed bilateral pulmonary embolism venous ultrasound revealed bilateral deep vein thrombosis.  He was started on heparin drip.  Unfortunately he had extreme bleeding from his surgical site and required reexploration on 03/29/2015.  Due to the fact he had bilateral DVTs bilateral PEs and could not tolerate anticoagulation interventional radiology was consulted and an IVC filter was placed on 03/28/2018 per Dr. Fransisco Beau. 03/29/2018 pulmonary  critical care was consulted for care of IVC filter. Plan will be to return to the IR clinic within 6 weeks and have IVC filter removed at that time.  He will most likely require anticoagulation for another 6 months and  an evaluation at that time. He does not have a past medical history of clot disorder.  No family history of anticoagulation disorders.  Most likely he is got DVT secondary to narcotics and decreased mobility.  At the time of this dictation he is awake alert no acute distress he does have right-sided weakness.   PLAN: Return to interventional radiology within 6 weeks to have inferior vena cava filter removed. He will most likely need to continue anticoagulation at that point for.  Of at least 6 months with further evaluation and treatment. Follow-up with neurosurgery. Follow-up with primary care physician.     Brett Canales Minor ACNP [facility] PCCM Pager [phone] till 1 pm If no answer page [phone] 03/29/2018, 10:04 AM

## 2018-03-29 NOTE — Progress Notes (Signed)
PT Cancellation Note  Patient Details Name: [name] [name] MRN: [id number] DOB: 09/16/1965   Cancelled Treatment:    Reason Eval/Treat Not Completed: Other (comment)   Per RN, Dr. Wynetta Emeryram requesting we hold PT until tomorrow;   [name] [name], PT  Acute Rehabilitation Services Pager [phone] Office [phone]    [name] [name] 03/29/2018, 4:19 PM

## 2018-03-29 NOTE — Progress Notes (Signed)
OT Cancellation Note  Patient Details Name: Azucena FreedCornell Wentling MRN: [id number] DOB: 05/09/1966   Cancelled Treatment:    Reason Eval/Treat Not Completed: Other (comment)(RN communicated that we were asked to hold until tomorrow by Dr. Wynetta Emeryram)  Evern BioLaura J Meggie Laseter 03/29/2018, 5:51 PM  Sherryl MangesLaura Sufyaan Palma OTR/L [phone]

## 2018-03-29 NOTE — Progress Notes (Signed)
Patient has had friend at bedside all day who has been assisting patient with various needs.  Patient states she is a Press photographercharge nurse at the [employer] [facility] [place].  Patient's visitor restarts IV pump when pump alarms as occluded.

## 2018-03-29 NOTE — Progress Notes (Signed)
It was observed that pt JP drain would not stay charged. Provider on call notified. He stated to observe pt and the dressing throughout shift. Pt also complaining of constipation with no results from MOM and inability to sleep. Provider on call gave verbal orders for Mag Citrate and Ambien 5 mg. Will continue to monitor. Mayford KnifeAmber Rohn Fritsch RN

## 2018-03-29 NOTE — Progress Notes (Signed)
Subjective: Patient reports Late entry note patient overall is doing well this morning complaining of tightness in his legs coolness however neurologically still approved with no regression.  Objective: Vital signs in last 24 hours: Temp:  [97.4 F (36.3 C)-98.2 F (36.8 C)] 97.4 F (36.3 C) (07/17 1659) Pulse Rate:  [58-71] 65 (07/17 1659) Resp:  [14-23] 15 (07/17 1659) BP: (128-166)/(81-100) 150/97 (07/17 1659) SpO2:  [100 %] 100 % (07/17 1659)  Intake/Output from previous day: 07/16 0701 - 07/17 0700 In: 2348.7 [P.O.:240; I.V.:2008.7; IV Piggyback:100] Out: 1390 [Urine:1325; Drains:15; Blood:50] Intake/Output this shift: Total I/O In: 1294 [P.O.:320; I.V.:974] Out: 820 [Urine:800; Drains:20]  Neurologically stable incision clean dry and intact drain functioning  Lab Results: Recent Labs    03/28/18 0129 03/29/18 0406  WBC 8.0 8.4  HGB 14.2 13.0  HCT 43.9 40.5  PLT 106* 87*   BMET No results for input(s): NA, K, CL, CO2, GLUCOSE, BUN, CREATININE, CALCIUM in the last 72 hours.  Studies/Results: Ir Ivc Filter Plmt / S&i /img Guid/mod Sed  Result Date: 03/28/2018 INDICATION: 52 year old male with a history bilateral lower extremity DVT and pulmonary embolism, and hemorrhage on anti coagulation. He has been referred for IVC filter placement. EXAM: ULTRASOUND GUIDED ACCESS RIGHT COMMON FEMORAL VEIN CAVAGRAM IVC FILTER PLACEMENT MEDICATIONS: None. ANESTHESIA/SEDATION: None FLUOROSCOPY TIME:  Fluoroscopy Time: 0 minutes 46 seconds (153 mGy). COMPLICATIONS: None PROCEDURE: The procedure, risks, benefits, and alternatives were explained to the patient. Specific risks discussed include bleeding, infection, contrast reaction, renal failure, IVC filter fracture, migration, ileo caval thrombus (3% incidence), need for further procedure, need for further surgery, pulmonary embolism, cardiopulmonary collapse, death. Questions regarding the procedure were encouraged and answered. The  patient understands and consents to the procedure. Ultrasound survey was performed with images stored and sent to PACs. The right inguinal region was prepped with chlorhexidine in a sterile fashion, and a sterile drape was applied covering the operative field. A sterile gown and sterile gloves were used for the procedure. Local anesthesia was provided with 1% Lidocaine. A single wall needle was used access the right common femoral vein under ultrasound. With excellent venous blood flow returned, in 035 wire passed through the needle into the IVC. Small incision was made with an 11 blade scalpel. The needle was removed. The delivery sheath for a retrievable Bard Denali filter was passed over the Bentson wire into the IVC. The wire was removed and small contrast was used to confirm IVC location. IVC cavagram performed. Dilator was removed, and the IVC filter was then delivered, positioned below the lowest renal vein, approximately L2 level. Repeat cavagram performed, and the catheter was removed. Manual pressure was used for hemostasis. Patient tolerated the procedure well and remained hemodynamically stable throughout. No complications were encountered and no significant blood loss was encounter. IMPRESSION: Status post placement of IVC filter via right common femoral approach. Signed, Yvone NeuJaime S. Reyne DumasWagner, DO, RPVI Vascular and Interventional Radiology Specialists [facility] Radiology PLAN: This IVC filter is potentially retrievable. The patient will be assessed for filter retrieval by Interventional Radiology in approximately 8-12 weeks. Further recommendations regarding filter retrieval, continued surveillance or declaration of device permanence, will be made at that time. Electronically Signed   By: Gilmer MorJaime  Wagner D.O.   On: 03/28/2018 17:42    Assessment/Plan: Postoperative day 1 reexploration of anterior cervical wound for evacuation of hematoma patient doing well neurologically incision soft symptomatic from his  DVTs in his legs with tightness and coolness. We will plan on slow initiation  of Coumadin starting tonight with a 10 mg tablet. Patient can get out of the chair c-collar at all times.  LOS: 7 days     Ailin Rochford P 03/29/2018, 6:58 PM

## 2018-03-29 NOTE — Progress Notes (Signed)
Subjective:  Spirits brighter but panic attacks with neck brace Eventful day with IVC filter and repeat surgery to drain hematoma yesterday  Objective:  Vitals:   03/28/18 2222 03/29/18 0400 03/29/18 0800 03/29/18 0829  BP: (!) 148/91 (!) 146/93  (!) 166/100  Pulse: 66  60 (!) 58  Resp: (!) 22  14 (!) 23  Temp: 98.1 F (36.7 C) 97.6 F (36.4 C)    TempSrc: Oral Oral    SpO2: 100% 100% 100% 100%  Weight:      Height:        Intake/Output from previous day:  Intake/Output Summary (Last 24 hours) at 03/29/2018 0853 Last data filed at 03/29/2018 0800 Gross per 24 hour  Intake 2406.41 ml  Output 1390 ml  Net 1016.41 ml    Physical Exam: Anxious  Obese black male  Neck collar on  JP drain with blood still  JVP normal no bruits no thyromegaly Lungs clear with no wheezing and good diaphragmatic motion Heart:  S1/S2 no murmur, no rub, gallop or click PMI normal Abdomen: benighn, BS positve, no tenderness, no AAA no bruit.  No HSM or HJR Distal pulses intact with no bruits Plus one bilateral edema Neuro non-focal Skin warm and dry No muscular weakness   Lab Results: Basic Metabolic Panel: No results for input(s): NA, K, CL, CO2, GLUCOSE, BUN, CREATININE, CALCIUM, MG, PHOS in the last 72 hours. CBC: Recent Labs    03/28/18 0129 03/29/18 0406  WBC 8.0 8.4  HGB 14.2 13.0  HCT 43.9 40.5  MCV 89.4 89.4  PLT 106* 87*    Imaging: Ct Angio Chest Pe W Or Wo Contrast  Result Date: 03/27/2018 CLINICAL DATA:  Chest pain, recent surgery, DVT. EXAM: CT ANGIOGRAPHY CHEST WITH CONTRAST TECHNIQUE: Multidetector CT imaging of the chest was performed using the standard protocol during bolus administration of intravenous contrast. Multiplanar CT image reconstructions and MIPs were obtained to evaluate the vascular anatomy. CONTRAST:  ISOVUE-370 IOPAMIDOL (ISOVUE-370) INJECTION 76% COMPARISON:  Chest radiograph March 25, 2018. FINDINGS: CARDIOVASCULAR: Adequate contrast  opacification of the pulmonary artery's. Main pulmonary artery is not enlarged. RIGHT upper lobar central pulmonary embolism, occlusive within segmental branches. Occlusive RIGHT middle lobe subsegmental pulmonary emboli. Occlusive segmental and subsegmental RIGHT lower lobe pulmonary emboli. Central nonocclusive filling defect LEFT main pulmonary artery casting into the lobar and segmental branches. Heart size is mildly enlarged, no right heart strain (RV/LV .8). No pericardial effusion. Thoracic aorta is normal course, 4 cm ascending aorta. Mild pulmonary venous congestion. MEDIASTINUM/NODES: No lymphadenopathy by CT size criteria. LUNGS/PLEURA: Tracheobronchial tree is patent, no pneumothorax. No pleural effusions, focal consolidations, pulmonary nodules or masses. Bilateral lower lobe atelectasis. Punctate LEFT upper lobe subpleural granuloma. UPPER ABDOMEN: Included view of the abdomen is unremarkable. MUSCULOSKELETAL: Nonacute. Moderate mid and lower thoracic ventral endplate spurring. Review of the MIP images confirms the above findings. IMPRESSION: 1. Bilateral acute pulmonary emboli, occlusive within multiple segmental and subsegmental branches. No RIGHT heart strain. 2. Mild cardiomegaly and pulmonary venous congestion. 3. **An incidental finding of potential clinical significance has been found. 4 cm aneurysmal ascending aorta. Recommend annual imaging followup by CTA or MRA. This recommendation follows 2010 ACCF/AHA/AATS/ACR/ASA/SCA/SCAI/SIR/STS/SVM Guidelines for the Diagnosis and Management of Patients with Thoracic Aortic Disease. Circulation. 2010; 121: W119-J478** 4. Critical Value/emergent results were discussed by Dr. Oleta Mouse, radiology at the time of interpretation on 03/27/2018 at 5:00 pm to Dr. Donalee Citrin , who verbally acknowledged these results. Electronically Signed   By:  Awilda Metroourtnay  Bloomer M.D.   On: 03/27/2018 17:04   Ir Ivc Filter Plmt / S&i /img Guid/mod Sed  Result Date:  03/28/2018 INDICATION: 52 year old male with a history bilateral lower extremity DVT and pulmonary embolism, and hemorrhage on anti coagulation. He has been referred for IVC filter placement. EXAM: ULTRASOUND GUIDED ACCESS RIGHT COMMON FEMORAL VEIN CAVAGRAM IVC FILTER PLACEMENT MEDICATIONS: None. ANESTHESIA/SEDATION: None FLUOROSCOPY TIME:  Fluoroscopy Time: 0 minutes 46 seconds (153 mGy). COMPLICATIONS: None PROCEDURE: The procedure, risks, benefits, and alternatives were explained to the patient. Specific risks discussed include bleeding, infection, contrast reaction, renal failure, IVC filter fracture, migration, ileo caval thrombus (3% incidence), need for further procedure, need for further surgery, pulmonary embolism, cardiopulmonary collapse, death. Questions regarding the procedure were encouraged and answered. The patient understands and consents to the procedure. Ultrasound survey was performed with images stored and sent to PACs. The right inguinal region was prepped with chlorhexidine in a sterile fashion, and a sterile drape was applied covering the operative field. A sterile gown and sterile gloves were used for the procedure. Local anesthesia was provided with 1% Lidocaine. A single wall needle was used access the right common femoral vein under ultrasound. With excellent venous blood flow returned, in 035 wire passed through the needle into the IVC. Small incision was made with an 11 blade scalpel. The needle was removed. The delivery sheath for a retrievable Bard Denali filter was passed over the Bentson wire into the IVC. The wire was removed and small contrast was used to confirm IVC location. IVC cavagram performed. Dilator was removed, and the IVC filter was then delivered, positioned below the lowest renal vein, approximately L2 level. Repeat cavagram performed, and the catheter was removed. Manual pressure was used for hemostasis. Patient tolerated the procedure well and remained  hemodynamically stable throughout. No complications were encountered and no significant blood loss was encounter. IMPRESSION: Status post placement of IVC filter via right common femoral approach. Signed, Yvone NeuJaime S. Reyne DumasWagner, DO, RPVI [facility] PLAN: This IVC filter is potentially retrievable. The patient will be assessed for filter retrieval by Interventional Radiology in approximately 8-12 weeks. Further recommendations regarding filter retrieval, continued surveillance or declaration of device permanence, will be made at that time. Electronically Signed   By: Gilmer MorJaime  Wagner D.O.   On: 03/28/2018 17:42    Cardiac Studies:  ECG:  NSR no acute changes    Telemetry:  NSR no arrhythmia   Echo: EF 50-55% no obvious cor pulmonale   Medications:   . baclofen  10 mg Oral BID  . Chlorhexidine Gluconate Cloth  6 each Topical Once  . dexamethasone  10 mg Intravenous Q6H  . docusate sodium  100 mg Oral BID  . pantoprazole  40 mg Oral Daily     . 0.9 % NaCl with KCl 20 mEq / L 75 mL/hr at 03/29/18 0800    Assessment/Plan:   No real cardiac issues Dyspnea and pre syncope from pulmonary embolus confirmed by CTA and LE duplex Bilateral DVT/ Post surgical bleed on heparin No anticoagulation for a few days IVC filter in to protect from further Emboli. Will add ARB for BP. Needs regular anxiolytic for panic attacks PLT;s continue to fall avoid all heparin  Have ordered HIT panel   Will follow peripherally   Charlton Hawseter Lekha Dancer 03/29/2018, 8:53 AM

## 2018-03-30 DIAGNOSIS — I2602 Saddle embolus of pulmonary artery with acute cor pulmonale: Secondary | ICD-10-CM

## 2018-03-30 LAB — PROTIME-INR
INR: 1.47
PROTHROMBIN TIME: 17.7 s — AB (ref 11.4–15.2)

## 2018-03-30 LAB — CBC
HCT: 39.9 % (ref 39.0–52.0)
Hemoglobin: 12.8 g/dL — ABNORMAL LOW (ref 13.0–17.0)
MCH: 29.3 pg (ref 26.0–34.0)
MCHC: 32.1 g/dL (ref 30.0–36.0)
MCV: 91.3 fL (ref 78.0–100.0)
PLATELETS: 80 10*3/uL — AB (ref 150–400)
RBC: 4.37 MIL/uL (ref 4.22–5.81)
RDW: 12.6 % (ref 11.5–15.5)
WBC: 10.5 10*3/uL (ref 4.0–10.5)

## 2018-03-30 LAB — HEPARIN INDUCED PLATELET AB (HIT ANTIBODY): HEPARIN INDUCED PLT AB: 0.228 {OD_unit} (ref 0.000–0.400)

## 2018-03-30 MED ORDER — WARFARIN - PHARMACIST DOSING INPATIENT: Freq: Every day | Status: DC

## 2018-03-30 MED ORDER — WARFARIN SODIUM 5 MG PO TABS
10.0000 mg | ORAL_TABLET | Freq: Once | ORAL | Status: AC
  Administered 2018-03-30: 10 mg via ORAL
  Filled 2018-03-30: qty 2

## 2018-03-30 NOTE — Progress Notes (Signed)
Subjective:  Spirits brighter but panic attacks with neck brace Eventful day with IVC filter and repeat surgery to drain hematoma yesterday  Objective:  Vitals:   03/30/18 0000 03/30/18 0400 03/30/18 0754 03/30/18 0755  BP: 119/83 131/81  116/70  Pulse:    61  Resp:  17  13  Temp: 97.6 F (36.4 C) 97.6 F (36.4 C) 97.7 F (36.5 C)   TempSrc: Oral Oral Oral   SpO2:  100%  100%  Weight:      Height:        Intake/Output from previous day:  Intake/Output Summary (Last 24 hours) at 03/30/2018 0900 Last data filed at 03/30/2018 0830 Gross per 24 hour  Intake 1587.1 ml  Output 1045 ml  Net 542.1 ml    Physical Exam: Smiling relaxed  Obese black male  Neck collar on with JP drain  JP drain with blood still  JVP normal no bruits no thyromegaly Lungs clear with no wheezing and good diaphragmatic motion Heart:  S1/S2 no murmur, no rub, gallop or click PMI normal Abdomen: benighn, BS positve, no tenderness, no AAA no bruit.  No HSM or HJR Distal pulses intact with no bruits Plus one bilateral edema Neuro non-focal Skin warm and dry No muscular weakness   Lab Results: Basic Metabolic Panel: No results for input(s): NA, K, CL, CO2, GLUCOSE, BUN, CREATININE, CALCIUM, MG, PHOS in the last 72 hours. CBC: Recent Labs    03/29/18 0406 03/30/18 0422  WBC 8.4 10.5  HGB 13.0 12.8*  HCT 40.5 39.9  MCV 89.4 91.3  PLT 87* 80*    Imaging: Ir Ivc Filter Plmt / S&i /img Guid/mod Sed  Result Date: 03/28/2018 INDICATION: 52 year old male with a history bilateral lower extremity DVT and pulmonary embolism, and hemorrhage on anti coagulation. He has been referred for IVC filter placement. EXAM: ULTRASOUND GUIDED ACCESS RIGHT COMMON FEMORAL VEIN CAVAGRAM IVC FILTER PLACEMENT MEDICATIONS: None. ANESTHESIA/SEDATION: None FLUOROSCOPY TIME:  Fluoroscopy Time: 0 minutes 46 seconds (153 mGy). COMPLICATIONS: None PROCEDURE: The procedure, risks, benefits, and alternatives were  explained to the patient. Specific risks discussed include bleeding, infection, contrast reaction, renal failure, IVC filter fracture, migration, ileo caval thrombus (3% incidence), need for further procedure, need for further surgery, pulmonary embolism, cardiopulmonary collapse, death. Questions regarding the procedure were encouraged and answered. The patient understands and consents to the procedure. Ultrasound survey was performed with images stored and sent to PACs. The right inguinal region was prepped with chlorhexidine in a sterile fashion, and a sterile drape was applied covering the operative field. A sterile gown and sterile gloves were used for the procedure. Local anesthesia was provided with 1% Lidocaine. A single wall needle was used access the right common femoral vein under ultrasound. With excellent venous blood flow returned, in 035 wire passed through the needle into the IVC. Small incision was made with an 11 blade scalpel. The needle was removed. The delivery sheath for a retrievable Bard Denali filter was passed over the Bentson wire into the IVC. The wire was removed and small contrast was used to confirm IVC location. IVC cavagram performed. Dilator was removed, and the IVC filter was then delivered, positioned below the lowest renal vein, approximately L2 level. Repeat cavagram performed, and the catheter was removed. Manual pressure was used for hemostasis. Patient tolerated the procedure well and remained hemodynamically stable throughout. No complications were encountered and no significant blood loss was encounter. IMPRESSION: Status post placement of IVC filter via right common  femoral approach. Signed, Yvone NeuJaime S. Reyne DumasWagner, DO, RPVI [facility] PLAN: This IVC filter is potentially retrievable. The patient will be assessed for filter retrieval by Interventional Radiology in approximately 8-12 weeks. Further recommendations  regarding filter retrieval, continued surveillance or declaration of device permanence, will be made at that time. Electronically Signed   By: Gilmer MorJaime  Wagner D.O.   On: 03/28/2018 17:42    Cardiac Studies:  ECG:  NSR no acute changes    Telemetry:  NSR no arrhythmia   Echo: EF 50-55% no obvious cor pulmonale   Medications:   . baclofen  10 mg Oral BID  . Chlorhexidine Gluconate Cloth  6 each Topical Once  . dexamethasone  10 mg Intravenous Q6H  . docusate sodium  100 mg Oral BID  . losartan  25 mg Oral Daily  . pantoprazole  40 mg Oral Daily  . Warfarin - Physician Dosing Inpatient   Does not apply q1800     . 0.9 % NaCl with KCl 20 mEq / L 75 mL/hr at 03/30/18 0800    Assessment/Plan:   PE:  Post IVC filter on heparin had first dose of coumadin yesterday  HTN:  ARB started improved Cervical Spine:  Collar in place minimal drainage JP drain ? Remove today or tomorrow No staged posterior procedure for now   Will follow peripherally   Charlton Hawseter Becca Bayne 03/30/2018, 9:00 AM

## 2018-03-30 NOTE — Progress Notes (Signed)
Noted PT/OT recommending CIR currently.  Posterior decompression fusion pending and delayed due to post-operative complications of DVTs, pulmonary emboli, and post op bleeding.  Question timing of needed surgery and whether pt able to tolerate aggressive therapy until after this is completed.  Pt [facility] able to have posterior surgery, then go to CIR.    MD: Please weigh in on timing of surgery and thoughts on discharge plan.    Thank you!   Quintella BatonJulie W. Annalycia Done, RN, BSN  Trauma/Neuro ICU Case Manager [phone]

## 2018-03-30 NOTE — Progress Notes (Addendum)
Physical Therapy Treatment Patient Details Name: [name] [name] MRN: [id number] DOB: 1966-07-01 Today's Date: 03/30/2018    History of Present Illness Patient is a 53 y/o male with PMH of HTN, DDD lumbar spine and gout.  He was admitted after a fall at home with inability to get up.  Reports one other recent fall when his dog pulled him over.  States recent R hand numbness,tingling and imbalance.  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  Now s/p anterior postion of surgery complete; pan for posterio on 7/15; Recovery complicated by 2 syncopal episodes and found to have bilat DVT with PE.  With anticoagulation developed some oozing and hematoma return to OR 7/16 to drain hematoma.    PT Comments    Patient with progress OOB to chair this session limited per RN by MD at this time.  BP 100/76 after in chair and pt somewhat symptomatic, though also anxious after twice having syncopal episode with this same activity.  Continue to feel he will need CIR level rehab when stable.    Follow Up Recommendations  CIR;Supervision/Assistance - 24 hour     Equipment Recommendations  Rolling walker with 5" wheels;3in1 (PT)    Recommendations for Other Services       Precautions / Restrictions Precautions Precautions: Fall Required Braces or Orthoses: Cervical Brace Cervical Brace: Hard collar;At all times    Mobility  Bed Mobility Overal bed mobility: Needs Assistance Bed Mobility: Rolling;Sidelying to Sit Rolling: Min assist Sidelying to sit: Min assist       General bed mobility comments: assist for R LE  Transfers   Equipment used: Rolling walker (2 wheeled) Transfers: Sit to/from [employer] Sit to Stand: Mod assist;+2 safety/equipment Stand pivot transfers: Min assist;+2 safety/equipment       General transfer comment: some lifting help from EOB and with walker assist for stand step to chair with cues  Ambulation/Gait                 Stairs              Wheelchair Mobility    Modified Rankin (Stroke Patients Only)       Balance Overall balance assessment: Needs assistance Sitting-balance support: Feet supported Sitting balance-Leahy Scale: Good     Standing balance support: Bilateral upper extremity supported Standing balance-Leahy Scale: Poor Standing balance comment: Reliance on BUE support                            Cognition Arousal/Alertness: Awake/alert Behavior During Therapy: WFL for tasks assessed/performed Overall Cognitive Status: Within Functional Limits for tasks assessed                                        Exercises General Exercises - Lower Extremity Ankle Circles/Pumps: AROM;10 reps;Both;Supine    General Comments        Pertinent Vitals/Pain Pain Assessment: Faces Faces Pain Scale: Hurts little more Pain Location: calves L>R Pain Descriptors / Indicators: Tightness Pain Intervention(s): Monitored during session;Repositioned;Heat applied    Home Living                      Prior Function            PT Goals (current goals can now be found in the care plan section) Progress towards PT  goals: Progressing toward goals(within limits guided by medical issues)    Frequency    Min 4X/week      PT Plan Current plan remains appropriate    Co-evaluation              AM-PAC PT "6 Clicks" Daily Activity  Outcome Measure  Difficulty turning over in bed (including adjusting bedclothes, sheets and blankets)?: A Little Difficulty moving from lying on back to sitting on the side of the bed? : Unable Difficulty sitting down on and standing up from a chair with arms (e.g., wheelchair, bedside commode, etc,.)?: Unable Help needed moving to and from a bed to chair (including a wheelchair)?: A Little Help needed walking in hospital room?: A Lot Help needed climbing 3-5 steps with a railing? : Total 6 Click Score: 11    End of Session  Equipment Utilized During Treatment: Gait belt;Cervical collar Activity Tolerance: Patient tolerated treatment well Patient left: with call bell/phone within reach;in chair Nurse Communication: Other (comment)(anxiety and BP) PT Visit Diagnosis: Other abnormalities of gait and mobility (R26.89);Muscle weakness (generalized) (M62.81);History of falling (Z91.81)     Time: 1000-1027 PT Time Calculation (min) (ACUTE ONLY): 27 min  Charges:  $Therapeutic Activity: 23-37 mins                    G CodesSheran Lawless:       Cyndi Alizae Bechtel, [place] CarolinaPT [phone] 03/30/2018    Elray Mcgregorynthia Jadian Karman 03/30/2018, 5:35 PM

## 2018-03-30 NOTE — Progress Notes (Signed)
NEUROSURGERY PROGRESS NOTE  Doing well. Complains of appropriate neck soreness. No difficulty swallowing No numbness, tingling or weakness Incision CDI  Temp:  [97.4 F (36.3 C)-98.2 F (36.8 C)] 97.6 F (36.4 C) (07/18 0400) Pulse Rate:  [58-71] 71 (07/17 1933) Resp:  [14-23] 17 (07/18 0400) BP: (119-166)/(81-100) 131/81 (07/18 0400) SpO2:  [100 %] 100 % (07/18 0400)  Plan: Continue JP drain until tomorrow since he got his first dose of coumadin last night. Continue to assess for drainage from incision.   Sherryl MangesKimberly Hannah Meyran, NP 03/30/2018 7:36 AM

## 2018-03-31 LAB — CBC
HCT: 41.6 % (ref 39.0–52.0)
HEMATOCRIT: 39.4 % (ref 39.0–52.0)
HEMOGLOBIN: 13.1 g/dL (ref 13.0–17.0)
Hemoglobin: 12.6 g/dL — ABNORMAL LOW (ref 13.0–17.0)
MCH: 29.1 pg (ref 26.0–34.0)
MCH: 29.6 pg (ref 26.0–34.0)
MCHC: 31.5 g/dL (ref 30.0–36.0)
MCHC: 32 g/dL (ref 30.0–36.0)
MCV: 92.4 fL (ref 78.0–100.0)
MCV: 92.7 fL (ref 78.0–100.0)
PLATELETS: 83 10*3/uL — AB (ref 150–400)
Platelets: 78 10*3/uL — ABNORMAL LOW (ref 150–400)
RBC: 4.5 MIL/uL (ref 4.22–5.81)
RBC: 4.25 MIL/uL (ref 4.22–5.81)
RDW: 13 % (ref 11.5–15.5)
RDW: 12.9 % (ref 11.5–15.5)
WBC: 12 10*3/uL — ABNORMAL HIGH (ref 4.0–10.5)
WBC: 13.4 10*3/uL — ABNORMAL HIGH (ref 4.0–10.5)

## 2018-03-31 LAB — ABO/RH: ABO/RH(D): O POS

## 2018-03-31 LAB — PROTIME-INR
INR: 3.2
PROTHROMBIN TIME: 32.5 s — AB (ref 11.4–15.2)

## 2018-03-31 MED ORDER — DEXAMETHASONE SODIUM PHOSPHATE 10 MG/ML IJ SOLN
4.0000 mg | Freq: Four times a day (QID) | INTRAMUSCULAR | Status: DC
  Administered 2018-03-31 – 2018-04-03 (×11): 4 mg via INTRAVENOUS
  Filled 2018-03-31 (×11): qty 1

## 2018-03-31 MED ORDER — SODIUM CHLORIDE 0.9% IV SOLUTION: Freq: Once | INTRAVENOUS | Status: DC

## 2018-03-31 NOTE — Progress Notes (Signed)
Spoke with Deanna ArtisKeisha RN, okay to give blood through present IV access. 22gx1"

## 2018-03-31 NOTE — Progress Notes (Addendum)
ANTICOAGULATION CONSULT NOTE - Follow Up Consult  Pharmacy Consult for warfarin Indication: DVT/PE  Allergies  Allergen Reactions  . Nsaids Other (See Comments)    G6PD  . Sulfa Antibiotics Other (See Comments)    G6PD    Patient Measurements: Height: 5\' 9"  (175.3 cm) Weight: (!) 306 lb 10.6 oz (139.1 kg) IBW/kg (Calculated) : 70.7   Vital Signs: Temp: 98.4 F (36.9 C) (07/19 0753) Temp Source: Oral (07/19 0753) BP: 129/84 (07/19 0753) Pulse Rate: 58 (07/19 0753)  Labs: Recent Labs    03/29/18 0406 03/30/18 0422 03/31/18 0340  HGB 13.0 12.8* 13.1  HCT 40.5 39.9 41.6  PLT 87* 80* 78*  LABPROT  --  17.7* 32.5*  INR  --  1.47 3.20    Estimated Creatinine Clearance: 105.2 mL/min (by C-G formula based on SCr of 1.14 mg/dL).    Assessment: [name] who was admitted for staged spinal surgery now on heparin gtt for new bilateral pulmonary embolus on CT along with bilateral distal DVTs. Of note, CT showed an incidental finding of a 4 cm aneurysmal ascending aorta. (No boluses) Started bleeding from incisions 7/16 AM, heparin stopped and Dr. Wynetta Emeryram reversed with protamine. IVC placed 7/16. Warfarin initiated by MD on 7/17, received 10mg  x2 days. INR supratherapeutic today at 3.2 from 1.4.   7/18 HIT antibody result: negative   Goal of Therapy:  INR 2-3  Plan:   Holding warfarin x1 tonight Monitor daily CBC, INR and s/s of bleeding  [name] [name] [name] [name] 03/31/2018,10:03 AM

## 2018-03-31 NOTE — Progress Notes (Signed)
Subjective: Patient reports some oozing over night from site. Otherwise doing well. Had a panic attack last night. Able to swallow with some difficulty  Objective: Vital signs in last 24 hours: Temp:  [97.4 F (36.3 C)-98.4 F (36.9 C)] 97.6 F (36.4 C) (07/19 1245) Pulse Rate:  [52-75] 66 (07/19 1245) Resp:  [18-21] 18 (07/19 1245) BP: (113-150)/(75-90) 126/80 (07/19 1245) SpO2:  [94 %-100 %] 100 % (07/19 1245)  Intake/Output from previous day: 07/18 0701 - 07/19 0700 In: 1880.9 [P.O.:480; I.V.:1400.9] Out: 1175 [Urine:1175] Intake/Output this shift: Total I/O In: 449.9 [I.V.:449.9] Out: 550 [Urine:550]  Neurologic: Grossly normal  Lab Results: Lab Results  Component Value Date   WBC 12.0 (H) 03/31/2018   HGB 13.1 03/31/2018   HCT 41.6 03/31/2018   MCV 92.4 03/31/2018   PLT 78 (L) 03/31/2018   Lab Results  Component Value Date   INR 3.20 03/31/2018   BMET Lab Results  Component Value Date   NA 139 03/25/2018   K 4.0 03/25/2018   CL 107 03/25/2018   CO2 24 03/25/2018   GLUCOSE 159 (H) 03/25/2018   BUN 24 (H) 03/25/2018   CREATININE 1.14 03/25/2018   CALCIUM 8.8 (L) 03/25/2018    Studies/Results: No results found.  Assessment/Plan: Will transfuse 1 unit platelets. Check PT/INR tomorrow morning. Hold coumadin tonight. Will likely give 5mg  Coumadin tomorrow night pending labwork.    LOS: 9 days    Tiana LoftKimberly Hannah Jelani Trueba 03/31/2018, 1:05 PM

## 2018-03-31 NOTE — Progress Notes (Signed)
PT Cancellation Note  Patient Details Name: [name] [name] MRN: [id number] DOB: 24-Mar-1966   Cancelled Treatment:    Reason Eval/Treat Not Completed: Patient declined, no reason specified(pt reports being in chair from 10am-6am following day and having just returned to bed. Pt reports fatigue and RLE cramping with decline for activity at this time)   Addelynn Batte B Arland Usery 03/31/2018, 8:34 AM  Delaney MeigsMaija Tabor Brayam Boeke, PT [phone]

## 2018-04-01 ENCOUNTER — Encounter (HOSPITAL_COMMUNITY): Payer: Self-pay | Admitting: *Deleted

## 2018-04-01 LAB — PROTIME-INR
INR: 5.02
PROTHROMBIN TIME: 46.2 s — AB (ref 11.4–15.2)

## 2018-04-01 LAB — PREPARE PLATELET PHERESIS
Unit division: 0
Unit division: 0

## 2018-04-01 LAB — CBC
HCT: 37.7 % — ABNORMAL LOW (ref 39.0–52.0)
Hemoglobin: 12 g/dL — ABNORMAL LOW (ref 13.0–17.0)
MCH: 29.2 pg (ref 26.0–34.0)
MCHC: 31.8 g/dL (ref 30.0–36.0)
MCV: 91.7 fL (ref 78.0–100.0)
PLATELETS: 89 10*3/uL — AB (ref 150–400)
RBC: 4.11 MIL/uL — ABNORMAL LOW (ref 4.22–5.81)
RDW: 13 % (ref 11.5–15.5)
WBC: 11.4 10*3/uL — AB (ref 4.0–10.5)

## 2018-04-01 LAB — BPAM PLATELET PHERESIS
BLOOD PRODUCT EXPIRATION DATE: 201907202359
Blood Product Expiration Date: 201907192359
ISSUE DATE / TIME: 201907191141
ISSUE DATE / TIME: 201907191214
UNIT TYPE AND RH: 5100
Unit Type and Rh: 6200

## 2018-04-01 NOTE — Progress Notes (Signed)
No new issues or problems overnight.  Patient resting comfortably.  Still with some numbness and tingling in both upper extremities.  No new weakness or other problem.  Awake and alert.  Oriented and appropriate.  Speech fluent.  Motor and sensory examination stable.  His cervical wound is flat and soft.  There is no evidence of ongoing hemorrhage or problem.  Status post cervical corpectomy complicated by DVT and pulmonary emboli.  Patient status post evacuation of postoperative cervical hematoma related to his anticoagulation.  Currently doing well.  INR elevated today.  Hold Coumadin.  Continue stepdown observation.

## 2018-04-01 NOTE — Progress Notes (Signed)
[id number] Lab called critical INR 5.02, MD notified, Md said to continue to hold coumadin.  [id number]- Patient told RN he has a history of pernicious anemia that was diagnosed when he was in the [employer] in the 1980s and before current hospitalization was taking a daily OTC iron supplement and sublingual B12 drops. MD paged.

## 2018-04-01 NOTE — Progress Notes (Signed)
Call to Dr Dutch QuintPoole about patient bleeding.Marland Kitchen.leaking out around anterior incision site Dr Dutch Quintpoole called back and stated to keep dsg on site and as long as blood not building up under skin to keep drsg on site.

## 2018-04-01 NOTE — Progress Notes (Signed)
Patient did great most of the day with family and friends visiting some.  No bleeding noted until late this afternoon from incision site. Blood slightly oozing out of site  With clean dressing applied to site.  Encouragement and support provided to patient with concerns ventilated from patient that " this is exactly what happened last time when I bleed out and ended up in the OR last time."

## 2018-04-01 NOTE — Progress Notes (Deleted)
Patient tolerated well gradually increasing elevating the [facility] after Tylenol given this am for headache.  Patient tolerated sitting in bedside chair for 30 mins without difficulty and tolerated well.

## 2018-04-01 NOTE — Progress Notes (Signed)
Pharmacy Heparin Induced Thrombocytopenia (HIT) Note:  [name] [name] is a 52 y.o. male being evaluated for HIT. Heparin was started 03/27/18 for bilateral pulmonary embolus and bilateral distal DVTs, and baseline platelets were 122,000. HIT labs were ordered on 7/17 when platelets dropped to 87,000.   Heparin Induced Plt Ab  Date/Time Value Ref Range Status  03/29/2018 09:56 AM 0.228 0.000 - 0.400 OD Final    Comment:    (NOTE) Performed At: [facility] [address] [place], [place] [id number] Jolene SchimkeNagendra Sanjai MD NW:[phone]      Score = 0 Score = 1 Score = 2 Calculated Score  Thrombocytopenia -Platelet fall < 30% -Any platelet fall with nadir < 10 -Platelet fall >50% BUT surgery within 3 days  -Any combination of platelet fall/nadir that does not fit score 0 or 2 -Platelet fall >50% AND nadir ?20 AND no surgery within 3 days     0  Timing -Platelets fall on days ? 4  AND no prior heparin exposure in previous 100 days -Consistent w/ platelet fall on days 5-10 but missing counts -Platelet fall within 1 day of start AND prior exposure to heparin within previous 31-100 days -Platelet fall after day 10 -Platelet fall 5-10 days after starting heparin -Platelet fall within 1 day of heparin start AND prior exposure within previous 5-30 days 0  Thrombosis -None suspected -Recurrent thrombosis in patient receiving therapeutic anticoagulants -Suspected thrombosis (awaiting confirmation via imaging) -Erythematous skin lesions at heparin injection sites -Confirmed new thrombosis -Skin necrosis at injection site -Anaphylactoid reaction to heparin IV bolus -Adrenal hemorrhage 2  oTher Causes -Probable other cause (see protocol for full list) -Possible other cause evident (see protocol for full list) -No alternative explanation 0  Total Calculated 4T Score:     2    Algorithm Recommendation: HIT ruled out due to low 4T score and negative heparin antibody (0.22). No allergy documented.    Plan: Will discontinue HIT alert per pharmacy consult.  Arvilla MarketMelissa Lawrance Wiedemann, PharmD PGY1 Pharmacy Resident Phone [phone] 04/01/2018     7:56 AM

## 2018-04-02 LAB — PROTIME-INR
INR: 4.53
PROTHROMBIN TIME: 42.6 s — AB (ref 11.4–15.2)

## 2018-04-02 NOTE — Progress Notes (Signed)
Overall stable.  No new issues or problems overnight.  Patient does note some feelings of swelling in his right arm and leg.  No difficulty swallowing.  Speech good.  Afebrile.  Vitals are stable.  Motor examination stable with some mild right distal upper extremity weakness and mild right lower externally weakness unchanged from yesterday.  Wound healing well.  No evidence of deep fluid collection or mass.  He still has some occasional serosanguineous drainage at the surface.  Overall progressing well.  Continue anticoagulation.  INR remains high.  Will hold Coumadin for now.

## 2018-04-02 NOTE — Progress Notes (Signed)
190450- Lab called critical INR of 4.53, MD notified, no new orders. Patient continues to have small amount of serosanguinous fluid oozing from neck incision. Continuing to monitor.

## 2018-04-03 DIAGNOSIS — R58 Hemorrhage, not elsewhere classified: Secondary | ICD-10-CM

## 2018-04-03 DIAGNOSIS — Z7901 Long term (current) use of anticoagulants: Secondary | ICD-10-CM

## 2018-04-03 DIAGNOSIS — I10 Essential (primary) hypertension: Secondary | ICD-10-CM

## 2018-04-03 DIAGNOSIS — I9789 Other postprocedural complications and disorders of the circulatory system, not elsewhere classified: Secondary | ICD-10-CM

## 2018-04-03 DIAGNOSIS — Z6841 Body Mass Index (BMI) 40.0 and over, adult: Secondary | ICD-10-CM

## 2018-04-03 DIAGNOSIS — I82403 Acute embolism and thrombosis of unspecified deep veins of lower extremity, bilateral: Secondary | ICD-10-CM

## 2018-04-03 LAB — COMPREHENSIVE METABOLIC PANEL
ALBUMIN: 2.9 g/dL — AB (ref 3.5–5.0)
ALK PHOS: 43 U/L (ref 38–126)
ALT: 65 U/L — AB (ref 0–44)
AST: 37 U/L (ref 15–41)
Anion gap: 6 (ref 5–15)
BUN: 36 mg/dL — AB (ref 6–20)
CALCIUM: 8.3 mg/dL — AB (ref 8.9–10.3)
CO2: 26 mmol/L (ref 22–32)
CREATININE: 1.39 mg/dL — AB (ref 0.61–1.24)
Chloride: 103 mmol/L (ref 98–111)
GFR calc Af Amer: >60 mL/min (ref >60)
GFR calc non Af Amer: 57 mL/min — ABNORMAL LOW (ref >60)
GLUCOSE: 130 mg/dL — AB (ref 70–99)
Potassium: 5 mmol/L (ref 3.5–5.1)
SODIUM: 135 mmol/L (ref 135–145)
TOTAL PROTEIN: 5.9 g/dL — AB (ref 6.5–8.1)
Total Bilirubin: 1.4 mg/dL — ABNORMAL HIGH (ref 0.3–1.2)

## 2018-04-03 LAB — CBC WITH DIFFERENTIAL/PLATELET
ABS IMMATURE GRANULOCYTES: 0.2 10*3/uL — AB (ref 0.0–0.1)
BASOS ABS: 0 10*3/uL (ref 0.0–0.1)
Basophils Relative: 0 %
Eosinophils Absolute: 0 10*3/uL (ref 0.0–0.7)
Eosinophils Relative: 0 %
HCT: 41.9 % (ref 39.0–52.0)
HEMOGLOBIN: 13.4 g/dL (ref 13.0–17.0)
Immature Granulocytes: 1 %
LYMPHS ABS: 1 10*3/uL (ref 0.7–4.0)
LYMPHS PCT: 8 %
MCH: 29.1 pg (ref 26.0–34.0)
MCHC: 32 g/dL (ref 30.0–36.0)
MCV: 90.9 fL (ref 78.0–100.0)
Monocytes Absolute: 0.8 10*3/uL (ref 0.1–1.0)
Monocytes Relative: 7 %
NEUTROS ABS: 10.2 10*3/uL — AB (ref 1.7–7.7)
Neutrophils Relative %: 84 %
Platelets: 116 10*3/uL — ABNORMAL LOW (ref 150–400)
RBC: 4.61 MIL/uL (ref 4.22–5.81)
RDW: 12.7 % (ref 11.5–15.5)
WBC: 12.2 10*3/uL — AB (ref 4.0–10.5)

## 2018-04-03 LAB — APTT: aPTT: 35 seconds (ref 24–36)

## 2018-04-03 LAB — PROTIME-INR
INR: 2.96
INR: 2.08
PROTHROMBIN TIME: 23.2 s — AB (ref 11.4–15.2)
Prothrombin Time: 30.6 seconds — ABNORMAL HIGH (ref 11.4–15.2)

## 2018-04-03 LAB — FIBRINOGEN: Fibrinogen: 154 mg/dL — ABNORMAL LOW (ref 210–475)

## 2018-04-03 MED ORDER — WARFARIN SODIUM 5 MG PO TABS: 5.0000 mg | ORAL_TABLET | Freq: Once | ORAL | Status: DC

## 2018-04-03 MED ORDER — WARFARIN - PHARMACIST DOSING INPATIENT: Freq: Every day | Status: DC

## 2018-04-03 MED ORDER — DEXAMETHASONE 4 MG PO TABS
4.0000 mg | ORAL_TABLET | Freq: Three times a day (TID) | ORAL | Status: DC
  Administered 2018-04-03 – 2018-04-06 (×8): 4 mg via ORAL
  Filled 2018-04-03 (×8): qty 1

## 2018-04-03 NOTE — Progress Notes (Signed)
Physical Therapy Treatment Patient Details Name: [name] [name] MRN: [id number] DOB: 1966/01/12 Today's Date: 04/03/2018    History of Present Illness Patient is a 52 y/o male with PMH of HTN, DDD lumbar spine and gout.  He was admitted after a fall at home with inability to get up.  Reports one other recent fall when his dog pulled him over.  States recent R hand numbness,tingling and imbalance.  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  Now s/p anterior postion of surgery complete; pan for posterio on 7/15; Recovery complicated by 2 syncopal episodes and found to have bilat DVT with PE.  With anticoagulation developed some oozing and hematoma return to OR 7/16 to drain hematoma.    PT Comments    Continuing work on functional mobility and activity tolerance;  Still keeping cautious, gentle mobitliy with close monitoring and cues to self-monitor as well; Discussed pt with dr. Wynetta Emery after session, and he indicated we are clear for progressive amb with careful monitoring; Pt continues to be very motivated, wants to be independent. Due to his age, family support, and motivation - continue to recommend CIR level therapy to maximize safety and independence and safety with mobility  Follow Up Recommendations  CIR;Supervision/Assistance - 24 hour     Equipment Recommendations  Rolling walker with 5" wheels;3in1 (PT)    Recommendations for Other Services Rehab consult(Dr. Wynetta Emery indicated he will put in the order)     Precautions / Restrictions Precautions Precautions: Fall Required Braces or Orthoses: Cervical Brace Cervical Brace: Hard collar;At all times Restrictions Weight Bearing Restrictions: No Other Position/Activity Restrictions: cervical - watch wound    Mobility  Bed Mobility Overal bed mobility: Needs Assistance Bed Mobility: Rolling;Sidelying to Sit Rolling: Mod assist Sidelying to sit: Mod assist;+2 for safety/equipment;HOB elevated       General bed mobility  comments: use of bed pad to bring hips EOB, log rolling cues, assist for trunk elevation  Transfers Overall transfer level: Needs assistance Equipment used: Rolling walker (2 wheeled) Transfers: Sit to/from Stand Sit to Stand: Mod assist;+2 safety/equipment         General transfer comment: mod A +2 for safety with initial boost (vc for safe hand placement) min A +2 once up on feet  Ambulation/Gait Ambulation/Gait assistance: Min assist Gait Distance (Feet): 5 Feet Assistive device: Rolling walker (2 wheeled) Gait Pattern/deviations: Step-to pattern;Step-through pattern;Decreased stride length;Narrow base of support;Scissoring;Shuffle;Decreased dorsiflexion - left;Decreased dorsiflexion - right Gait velocity: decreased   General Gait Details: Cues to self-monitor for activity tolerance; short, inefficient steps with dependence on RW for support   Stairs             Wheelchair Mobility    Modified Rankin (Stroke Patients Only)       Balance Overall balance assessment: Needs assistance Sitting-balance support: Feet supported Sitting balance-Leahy Scale: Good     Standing balance support: Bilateral upper extremity supported Standing balance-Leahy Scale: Poor Standing balance comment: Reliance on BUE support                            Cognition Arousal/Alertness: Awake/alert Behavior During Therapy: WFL for tasks assessed/performed Overall Cognitive Status: Within Functional Limits for tasks assessed                                        Exercises  General Comments        Pertinent Vitals/Pain Pain Assessment: Faces Faces Pain Scale: Hurts little more Pain Location: calves L>R Pain Descriptors / Indicators: Tightness Pain Intervention(s): Monitored during session    Home Living                      Prior Function            PT Goals (current goals can now be found in the care plan section) Acute Rehab PT  Goals Patient Stated Goal: to return to independent PT Goal Formulation: With patient Time For Goal Achievement: 04/06/18 Potential to Achieve Goals: Good Progress towards PT goals: Progressing toward goals    Frequency    Min 4X/week      PT Plan Current plan remains appropriate    Co-evaluation PT/OT/SLP Co-Evaluation/Treatment: Yes Reason for Co-Treatment: For patient/therapist safety(activity tolerance) PT goals addressed during session: Mobility/safety with mobility OT goals addressed during session: ADL's and self-care      AM-PAC PT "6 Clicks" Daily Activity  Outcome Measure  Difficulty turning over in bed (including adjusting bedclothes, sheets and blankets)?: A Lot Difficulty moving from lying on back to sitting on the side of the bed? : Unable Difficulty sitting down on and standing up from a chair with arms (e.g., wheelchair, bedside commode, etc,.)?: Unable Help needed moving to and from a bed to chair (including a wheelchair)?: A Little Help needed walking in hospital room?: A Lot Help needed climbing 3-5 steps with a railing? : Total 6 Click Score: 10    End of Session Equipment Utilized During Treatment: Gait belt;Cervical collar Activity Tolerance: Patient tolerated treatment well Patient left: in chair;with call bell/phone within reach Nurse Communication: Mobility status PT Visit Diagnosis: Other abnormalities of gait and mobility (R26.89);Muscle weakness (generalized) (M62.81);History of falling (Z91.81)     Time: [id number] PT Time Calculation (min) (ACUTE ONLY): 26 min  Charges:  $Gait Training: 8-22 mins                    G Codes:       Van ClinesHolly Vijay [name], PT  Acute Rehabilitation Services Pager [phone] Office [phone]    [name] AlandHolly H Vani [name] 04/03/2018, 4:12 PM

## 2018-04-03 NOTE — Progress Notes (Signed)
Subjective: Patient reports Patient feels well denies any new numbness and tingling in his arm or his legs doesn't denies any difficulty swallowing today.  Objective: Vital signs in last 24 hours: Temp:  [97.6 F (36.4 C)-98.1 F (36.7 C)] 97.8 F (36.6 C) (07/22 1100) Pulse Rate:  [37-83] 42 (07/22 1112) Resp:  [12-18] 13 (07/22 1112) BP: (130-151)/(76-88) 141/82 (07/22 1112) SpO2:  [92 %-98 %] 94 % (07/22 1112)  Intake/Output from previous day: 07/21 0701 - 07/22 0700 In: 2724.9 [P.O.:720; I.V.:2004.9] Out: 3401 [Urine:3400; Stool:1] Intake/Output this shift: Total I/O In: 614.3 [P.O.:240; I.V.:374.3] Out: 1150 [Urine:1150]  Strength improved for placenta 5 right upper cavity and grip looks 75 out of 5 incision very small amount of swelling around the incisions but no significant soft tissue hematoma.  Lab Results: Recent Labs    03/31/18 1620 04/01/18 0308  WBC 13.4* 11.4*  HGB 12.6* 12.0*  HCT 39.4 37.7*  PLT 83* 89*   BMET No results for input(s): NA, K, CL, CO2, GLUCOSE, BUN, CREATININE, CALCIUM in the last 72 hours.  Studies/Results: No results found.  Assessment/Plan: Patient remains with an INR over 2 this morning he got 2 doses of 10 mg of Coumadin on Wednesday and Thursday night INR went over 3 on Friday and then went over 5 on Saturday he has not had any Coumadin since late Thursday night. Very unusual response to what seems like a small dose of warfarin. In addition became thrombocytopenic was transfused platelets 1 from 80-89 post transfusion. I have asked hematology to see him every time we tried anticoagulate him he's had complication of bleeding around the surgical site so if possible I'd rather not anticoagulate him if we think that his multiple small areas are not at significant risk in that scenario. He is alert he had a filter placed for his DVTs.  LOS: 12 days     Brion Sossamon P 04/03/2018, 3:10 PM

## 2018-04-03 NOTE — Progress Notes (Deleted)
ANTICOAGULATION CONSULT NOTE - Follow Up Consult  Pharmacy Consult for warfarin Indication: DVT/PE  Allergies  Allergen Reactions  . Nsaids Other (See Comments)    G6PD  . Sulfa Antibiotics Other (See Comments)    G6PD    Patient Measurements: Height: 5\' 9"  (175.3 cm) Weight: (!) 306 lb 10.6 oz (139.1 kg) IBW/kg (Calculated) : 70.7   Vital Signs: Temp: 98 F (36.7 C) (07/22 0900) Temp Source: Oral (07/22 0803) BP: 145/86 (07/22 0803) Pulse Rate: 43 (07/22 0803)  Labs: Recent Labs    03/31/18 1620 04/01/18 0308 04/02/18 0220 04/03/18 0226  HGB 12.6* 12.0*  --   --   HCT 39.4 37.7*  --   --   PLT 83* 89*  --   --   LABPROT  --  46.2* 42.6* 30.6*  INR  --  5.02* 4.53* 2.96    Estimated Creatinine Clearance: 105.2 mL/min (by C-G formula based on SCr of 1.14 mg/dL).    Assessment: [age] YOM who was admitted for staged spinal surgery now on heparin gtt for new bilateral pulmonary embolus on CT along with bilateral distal DVTs. Of note, CT showed an incidental finding of a 4 cm aneurysmal ascending aorta. (No boluses) Started bleeding from incisions 7/16 AM, heparin stopped and Dr. Wynetta Emeryram reversed with protamine. IVC placed 7/16. Warfarin initiated by MD on 7/17, received 10mg  x2 days. INR therapeutic today at 2.96 from 4.53.   Due to dramatic decline in INR today will redose with warfarin 5 mg po x 1 tonight.  Goal of Therapy:  INR 2-3  Plan:   Warfarin 5 mg po x 1 tonight Monitor daily CBC, INR and s/s of bleeding  Tera Materatherine A Johnothan Bascomb, PharmD, FCCM 04/03/2018,10:57 AM

## 2018-04-03 NOTE — Progress Notes (Signed)
Occupational Therapy Treatment Patient Details Name: [name] [name] MRN: [id number] DOB: [birth year] Today's Date: 04/03/2018    History of present illness Patient is a 52 y/o male with PMH of HTN, DDD lumbar spine and gout.  He was admitted after a fall at home with inability to get up.  Reports one other recent fall when his dog pulled him over.  States recent R hand numbness,tingling and imbalance.  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  Now s/p anterior postion of surgery complete; pan for posterio on 7/15; Recovery complicated by 2 syncopal episodes and found to have bilat DVT with PE.  With anticoagulation developed some oozing and hematoma return to OR 7/16 to drain hematoma.   OT comments  Pt progressing towards OT goals today. Reviewed cervical precautions with Pt, Mod A +2 for sit<>stand from elevated bed and min A +2 for short ambulation with RW to recliner. Rear peri care performed in standing with max A. Pt continues to be very motivated, wants to be independent. Due to his age, family support, and motivation - continue to recommend CIR level therapy to maximize safety and independence in ADL. Next session to focus on establishing HEP for RUE.   Follow Up Recommendations  CIR;Supervision - Intermittent    Equipment Recommendations  3 in 1 bedside commode    Recommendations for Other Services Rehab consult    Precautions / Restrictions Precautions Precautions: Fall Required Braces or Orthoses: Cervical Brace Cervical Brace: Hard collar;At all times Restrictions Weight Bearing Restrictions: No Other Position/Activity Restrictions: cervical - watch wound       Mobility Bed Mobility Overal bed mobility: Needs Assistance Bed Mobility: Rolling;Sidelying to Sit Rolling: Mod assist Sidelying to sit: Mod assist;+2 for safety/equipment;HOB elevated       General bed mobility comments: use of bed pad to bring hips EOB, log rolling cues, assist for trunk  elevation  Transfers Overall transfer level: Needs assistance Equipment used: Rolling walker (2 wheeled) Transfers: Sit to/from Stand Sit to Stand: Mod assist;+2 safety/equipment         General transfer comment: mod A +2 for safety with initial boost (vc for safe hand placement) min A +2 once up on feet    Balance Overall balance assessment: Needs assistance Sitting-balance support: Feet supported Sitting balance-Leahy Scale: Good     Standing balance support: Bilateral upper extremity supported Standing balance-Leahy Scale: Poor Standing balance comment: Reliance on BUE support                           ADL either performed or assessed with clinical judgement   ADL Overall ADL's : Needs assistance/impaired                         Toilet Transfer: Moderate assistance;+2 for safety/equipment;BSC;RW Toilet Transfer Details (indicate cue type and reason): mod A +2 for safety with initial boost (vc for safe hand placement) min A +2 once up on feet Toileting- Clothing Manipulation and Hygiene: Maximal assistance;+2 for safety/equipment;Sit to/from stand Toileting - Clothing Manipulation Details (indicate cue type and reason): for rear peri care     Functional mobility during ADLs: Minimal assistance;+2 for safety/equipment;Rolling walker General ADL Comments: reviewed cervical precautions with Pt     Vision       Perception     Praxis      Cognition Arousal/Alertness: Awake/alert Behavior During Therapy: WFL for tasks assessed/performed Overall Cognitive Status:  Within Functional Limits for tasks assessed                                          Exercises     Shoulder Instructions       General Comments      Pertinent Vitals/ Pain       Pain Assessment: Faces Faces Pain Scale: Hurts little more Pain Location: calves L>R Pain Descriptors / Indicators: Tightness Pain Intervention(s): Monitored during  session;Repositioned  Home Living                                          Prior Functioning/Environment              Frequency  Min 3X/week        Progress Toward Goals  OT Goals(current goals can now be found in the care plan section)  Progress towards OT goals: Progressing toward goals  Acute Rehab OT Goals Patient Stated Goal: to return to independent OT Goal Formulation: With patient Time For Goal Achievement: 04/06/18 Potential to Achieve Goals: Good  Plan Discharge plan remains appropriate;Frequency remains appropriate    Co-evaluation    PT/OT/SLP Co-Evaluation/Treatment: Yes Reason for Co-Treatment: For patient/therapist safety;To address functional/ADL transfers(activity tolerance) PT goals addressed during session: Mobility/safety with mobility;Balance;Proper use of DME OT goals addressed during session: ADL's and self-care;Proper use of Adaptive equipment and DME;Strengthening/ROM      AM-PAC PT "6 Clicks" Daily Activity     Outcome Measure   Help from another person eating meals?: A Little Help from another person taking care of personal grooming?: A Little Help from another person toileting, which includes using toliet, bedpan, or urinal?: A Little Help from another person bathing (including washing, rinsing, drying)?: A Lot Help from another person to put on and taking off regular upper body clothing?: A Lot Help from another person to put on and taking off regular lower body clothing?: A Lot 6 Click Score: 15    End of Session Equipment Utilized During Treatment: Gait belt;Rolling walker;Cervical collar  OT Visit Diagnosis: Unsteadiness on feet (R26.81);Other abnormalities of gait and mobility (R26.89);Repeated falls (R29.6);Muscle weakness (generalized) (M62.81);History of falling (Z91.81);Hemiplegia and hemiparesis Hemiplegia - Right/Left: Right Hemiplegia - dominant/non-dominant: Dominant Hemiplegia - caused by:  Unspecified   Activity Tolerance Patient tolerated treatment well   Patient Left in chair;with call bell/phone within reach   Nurse Communication Mobility status;Precautions        Time: 1203-1227 OT Time Calculation (min): 24 min  Charges: OT General Charges $OT Visit: 1 Visit OT Treatments $Self Care/Home Management : 8-22 mins  Sherryl MangesLaura Antwione Picotte OTR/L [phone]   Evern BioLaura J Jalysa Swopes 04/03/2018, 3:13 PM

## 2018-04-03 NOTE — Progress Notes (Signed)
.    HEMATOLOGY/ONCOLOGY CONSULTATION NOTEMarland Kitchen  Date of Service: 04/03/2018  Patient Care Team: Devra DoppHowell, [name], MD as PCP - General (Family Medicine)  CHIEF COMPLAINTS/PURPOSE OF CONSULTATION:  Recommendation regarding Anticoagulation and Mx of b/l DVT and PE  HISTORY OF PRESENTING ILLNESS:   [name] [name] is a wonderful 52 y.o. male who has been referred to us by Dr Wynetta Emeryram for evaluation and management of b/l DVT and PE with post-operative bleeding on anticoagulation.  Patient has a history of pernicious anemia, degenerative disc disease, gout, hypertension who has had issues with balance difficulties and numbness in his hand primarily on the right side and right leg weakness for several weeks and chronic neck pain then had a fall on a wet surface with worsening weakness.   Imaging studies suggested long-standing cervical stenosis with additional injury from his fall with concerns for severe cord compression.  He had cervical cord decompression surgery on 03/24/2018 with CERVICAL FOUR ANTERIOR CERVICAL CORPECTOMY.  On 03/27/2018 the patient had a syncopal episode lasting 15 to 20 seconds and also was noted to have left calf pain and swelling.  Bilateral lower extremity venous ultrasound was done which showed bilateral DVT. Right: There is evidence of acute DVT in the Posterior Tibial veins, and Peroneal veins. No cystic structure found in the popliteal fossa. Left: There is evidence of acute DVT in the Posterior Tibial veins, and Peroneal veins. No cystic structure found in the popliteal fossa.  He also had a CTA of the chest on 03/27/2018 which showed Bilateral acute pulmonary emboli, occlusive within multiple lung segmental and subsegmental branches. No RIGHT heart strain.  Echocardiogram done showed ejection fraction of 50 to 55% with no regional wall motion normalities and grade 2 diastolic dysfunction.  Patient was started on IV heparin for his DVTs and PE which she had on postop day 3  that is 03/27/2018.  He was noted to have oozing at his incision site and developed a soft tissue hematoma.  Protamine was used to try to reverse the heparin but since he developed a significant soft tissue hematoma he underwent exploration of the wound for evacuation of the hematoma on 03/28/2018 and anticoagulation was held.  IVC filter was placed on 03/28/2018 due to his lower extremity DVT with PE and concerns for hemorrhage on anticoagulation.  Mild drop in platelets from baseline of 122k to about 87k .  Likely postop drop.  Short-term exposure to heparin.  HIT antibodies not significantly elevated less than 0.5 OD.  At 0.22 with a low 4T score.  Patient was started on higher dose of Coumadin at 10 mg daily from 03/29/2018 and had rapid increase in INR from 1.4 to 3.2 to 5.1 resulting in holding the Coumadin.  Patient notes significant bilateral lower extremity swelling and upper extremity swelling as well. Currently not on oxygen and notes no acute shortness of breath or chest pain.  He is keen to be on some anticoagulation and is very concerned that his blood clots could progress and cause more issues.    MEDICAL HISTORY:  Past Medical History:  Diagnosis Date  . Degenerative disc disease, lumbar   . Gout   . History of pernicious anemia 1980   Patient says he takes OTC iron supplements QD  . Hypertension     SURGICAL HISTORY: Past Surgical History:  Procedure Laterality Date  . ANTERIOR CERVICAL CORPECTOMY N/A 03/24/2018   Procedure: CERVICAL FOUR ANTERIOR CERVICAL CORPECTOMY;  Surgeon: Donalee Citrinram, Gary, MD;  Location: [facility] OR;  Service: Neurosurgery;  Laterality: N/A;  . ANTERIOR CERVICAL DECOMP/DISCECTOMY FUSION N/A 03/28/2018   Procedure: Reexploration of anterior cervical wound;  Surgeon: Donalee Citrin, MD;  Location: [facility] OR;  Service: Neurosurgery;  Laterality: N/A;  . IR IVC FILTER PLMT / S&I Lenise Arena GUID/MOD SED  03/28/2018  . MENISCUS REPAIR Left   . TENDON REPAIR     tricept tendon     SOCIAL HISTORY: Social History   Socioeconomic History  . Marital status: Legally Separated    Spouse name: Not on file  . Number of children: Not on file  . Years of education: Not on file  . Highest education level: Not on file  Occupational History  . Not on file  Social Needs  . Financial resource strain: Not on file  . Food insecurity:    Worry: Not on file    Inability: Not on file  . Transportation needs:    Medical: Not on file    Non-medical: Not on file  Tobacco Use  . Smoking status: Never Smoker  . Smokeless tobacco: Never Used  Substance and Sexual Activity  . Alcohol use: Never    Frequency: Never  . Drug use: Never  . Sexual activity: Not on file  Lifestyle  . Physical activity:    Days per week: Not on file    Minutes per session: Not on file  . Stress: Not on file  Relationships  . Social connections:    Talks on phone: Not on file    Gets together: Not on file    Attends religious service: Not on file    Active member of club or organization: Not on file    Attends meetings of clubs or organizations: Not on file    Relationship status: Not on file  . Intimate partner violence:    Fear of current or ex partner: Not on file    Emotionally abused: Not on file    Physically abused: Not on file    Forced sexual activity: Not on file  Other Topics Concern  . Not on file  Social History Narrative  . Not on file    FAMILY HISTORY: History reviewed. No pertinent family history.  ALLERGIES:  is allergic to nsaids and sulfa antibiotics.  MEDICATIONS:  Current Facility-Administered Medications  Medication Dose Route Frequency Provider Last Rate Last Dose  . 0.9 %  sodium chloride infusion (Manually program via Guardrails IV Fluids)   Intravenous Once Meyran, Tiana Loft, NP      . 0.9 % NaCl with KCl 20 mEq/ L  infusion   Intravenous Continuous Donalee Citrin, MD 75 mL/hr at 04/03/18 1600    . acetaminophen (TYLENOL) tablet 650 mg  650 mg Oral  Q4H PRN Donalee Citrin, MD       Or  . acetaminophen (TYLENOL) suppository 650 mg  650 mg Rectal Q4H PRN Donalee Citrin, MD      . alum & mag hydroxide-simeth (MAALOX/MYLANTA) 200-200-20 MG/5ML suspension 30 mL  30 mL Oral Q6H PRN Donalee Citrin, MD   30 mL at 04/02/18 1117  . baclofen (LIORESAL) tablet 10 mg  10 mg Oral BID Donalee Citrin, MD   10 mg at 04/03/18 0915  . Chlorhexidine Gluconate Cloth 2 % PADS 6 each  6 each Topical Once Donalee Citrin, MD      . cyclobenzaprine (FLEXERIL) tablet 10 mg  10 mg Oral TID PRN Donalee Citrin, MD   10 mg at 04/02/18 2110  . dexamethasone (DECADRON) tablet 4  mg  4 mg Oral Q8H Donalee Citrin, MD      . docusate sodium (COLACE) capsule 100 mg  100 mg Oral BID Donalee Citrin, MD   100 mg at 04/03/18 0915  . HYDROmorphone (DILAUDID) injection 1 mg  1 mg Intravenous Q2H PRN Donalee Citrin, MD   1 mg at 03/28/18 0649  . lidocaine (XYLOCAINE) 1 % (with pres) injection    PRN Gilmer Mor, DO   10 mL at 03/28/18 1634  . losartan (COZAAR) tablet 25 mg  25 mg Oral Daily Wendall Stade, MD   25 mg at 04/03/18 0916  . menthol-cetylpyridinium (CEPACOL) lozenge 3 mg  1 lozenge Oral PRN Donalee Citrin, MD   3 mg at 03/27/18 0940   Or  . phenol (CHLORASEPTIC) mouth spray 1 spray  1 spray Mouth/Throat PRN Donalee Citrin, MD   1 spray at 03/26/18 0140  . ondansetron (ZOFRAN) tablet 4 mg  4 mg Oral Q6H PRN Donalee Citrin, MD       Or  . ondansetron [facility]) injection 4 mg  4 mg Intravenous Q6H PRN Donalee Citrin, MD   4 mg at 03/26/18 0300  . oxyCODONE (Oxy IR/ROXICODONE) immediate release tablet 10 mg  10 mg Oral Q3H PRN Donalee Citrin, MD   10 mg at 04/03/18 0554  . pantoprazole (PROTONIX) EC tablet 40 mg  40 mg Oral Daily Donalee Citrin, MD   40 mg at 04/03/18 0915  . zolpidem (AMBIEN) tablet 5 mg  5 mg Oral QHS PRN Barnett Abu, MD   5 mg at 03/29/18 0038    REVIEW OF SYSTEMS:    10 Point review of Systems was done is negative except as noted above.  PHYSICAL EXAMINATION: ECOG PERFORMANCE STATUS: 2 - Symptomatic,  <50% confined to bed  . Vitals:   04/03/18 1112 04/03/18 1530  BP: (!) 141/82   Pulse: (!) 42 (!) 56  Resp: 13 15  Temp:    SpO2: 94% 91%   Filed Weights   03/22/18 1115 03/23/18 0100  Weight: (!) 305 lb (138.3 kg) (!) 306 lb 10.6 oz (139.1 kg)   .Body mass index is 45.29 kg/m.  GENERAL:alert, no acute distress  sKIN: no acute rashes EYES: conjunctiva are pink and non-injected, sclera anicteric OROPHARYNX: MMM NECK: In hard cervical collar, no overt oozing from his surgical incision. LYMPH:  no palpable lymphadenopathy in the cervical, axillary or inguinal regions LUNGS: clear to auscultation b/l with normal respiratory effort HEART: regular rate & rhythm ABDOMEN:  normoactive bowel sounds , non tender, not distended. Extremity: Bilateral upper and lower extremity swelling PSYCH: alert & oriented x 3 with fluent speech NEURO: Per neurosurgery  LABORATORY DATA:  I have reviewed the data as listed  . CBC Latest Ref Rng & Units 04/03/2018 04/01/2018 03/31/2018  WBC 4.0 - 10.5 K/uL 12.2(H) 11.4(H) 13.4(H)  Hemoglobin 13.0 - 17.0 g/dL 16.1 12.0(L) 12.6(L)  Hematocrit 39.0 - 52.0 % 41.9 37.7(L) 39.4  Platelets 150 - 400 K/uL 116(L) 89(L) 83(L)    . CMP Latest Ref Rng & Units 04/03/2018 03/25/2018 03/22/2018  Glucose 70 - 99 mg/dL 096(E) 454(U) 84  BUN 6 - 20 mg/dL 98(J) 19(J) 10  Creatinine 0.61 - 1.24 mg/dL 4.78(G) 9.56 2.13  Sodium 135 - 145 mmol/L 135 139 141  Potassium 3.5 - 5.1 mmol/L 5.0 4.0 3.7  Chloride 98 - 111 mmol/L 103 107 106  CO2 22 - 32 mmol/L 26 24 24   Calcium 8.9 - 10.3 mg/dL 8.3(L) 8.8(L)  9.2  Total Protein 6.5 - 8.1 g/dL 5.9(L) - -  Total Bilirubin 0.3 - 1.2 mg/dL 1.6(X) - -  Alkaline Phos 38 - 126 U/L 43 - -  AST 15 - 41 U/L 37 - -  ALT 0 - 44 U/L 65(H) - -   Component     Latest Ref Rng & Units 04/01/2018 04/02/2018 04/03/2018 04/03/2018           2:26 AM  4:02 PM  Prothrombin Time     11.4 - 15.2 seconds 46.2 (H) 42.6 (H) 30.6 (H) 23.2 (H)  INR       5.02 (HH) 4.53 (HH) 2.96 2.08  APTT     24 - 36 seconds    35  Fibrinogen     210 - 475 mg/dL    096 (L)    RADIOGRAPHIC STUDIES: I have personally reviewed the radiological images as listed and agreed with the findings in the report. Dg Cervical Spine 1 View  Result Date: 03/24/2018 CLINICAL DATA:  Cervical fusion. EXAM: DG CERVICAL SPINE - 1 VIEW; DG C-ARM 61-120 MIN COMPARISON:  Cervical spine CT 03/22/2018. FINDINGS: 2 intraoperative spot fluoro films are submitted. Images show C4 corpectomy with cage placement. Anterior cervical plate extends from C3 to C5. Fine bone detail not well demonstrated due to fluoroscopy. IMPRESSION: Intraoperative assessment during C4 corpectomy and anterior fusion from C3-C5. Electronically Signed   By: Kennith Center M.D.   On: 03/24/2018 12:16   Ct Head Wo Contrast  Result Date: 03/22/2018 CLINICAL DATA:  Right-sided numbness and tingling, recent fall EXAM: CT HEAD WITHOUT CONTRAST TECHNIQUE: Contiguous axial images were obtained from the base of the skull through the vertex without intravenous contrast. COMPARISON:  None. FINDINGS: Brain: The ventricular system is prominent, disproportionate to the level of cortical atrophy. Therefore, normal pressure hydrocephalus would be a consideration if appropriate clinically. The fourth ventricle and basilar cisterns are unremarkable. No hemorrhage, mass lesion, or acute infarction is seen. Vascular: No vascular abnormality is noted on this unenhanced study. Skull: On bone window images, no calvarial abnormality is seen. Sinuses/Orbits: There is retention cyst and possibly small amount of fluid in the right maxillary sinus consistent with right maxillary sinus disease. Mild mucosal thickening is present medially in the left maxillary sinus. The remainder of paranasal sinuses are well pneumatized. Other: None. IMPRESSION: 1. Ventriculomegaly, disproportionate to the degree of atrophy. Consider the possibility of normal  pressure hydrocephalus. 2. No acute intracranial abnormality. 3. Right maxillary sinus disease. Electronically Signed   By: Dwyane Dee M.D.   On: 03/22/2018 14:34   Ct Angio Chest Pe W Or Wo Contrast  Result Date: 03/27/2018 CLINICAL DATA:  Chest pain, recent surgery, DVT. EXAM: CT ANGIOGRAPHY CHEST WITH CONTRAST TECHNIQUE: Multidetector CT imaging of the chest was performed using the standard protocol during bolus administration of intravenous contrast. Multiplanar CT image reconstructions and MIPs were obtained to evaluate the vascular anatomy. CONTRAST:  ISOVUE-370 IOPAMIDOL (ISOVUE-370) INJECTION 76% COMPARISON:  Chest radiograph March 25, 2018. FINDINGS: CARDIOVASCULAR: Adequate contrast opacification of the pulmonary artery's. Main pulmonary artery is not enlarged. RIGHT upper lobar central pulmonary embolism, occlusive within segmental branches. Occlusive RIGHT middle lobe subsegmental pulmonary emboli. Occlusive segmental and subsegmental RIGHT lower lobe pulmonary emboli. Central nonocclusive filling defect LEFT main pulmonary artery casting into the lobar and segmental branches. Heart size is mildly enlarged, no right heart strain (RV/LV .8). No pericardial effusion. Thoracic aorta is normal course, 4 cm ascending aorta. Mild pulmonary venous  congestion. MEDIASTINUM/NODES: No lymphadenopathy by CT size criteria. LUNGS/PLEURA: Tracheobronchial tree is patent, no pneumothorax. No pleural effusions, focal consolidations, pulmonary nodules or masses. Bilateral lower lobe atelectasis. Punctate LEFT upper lobe subpleural granuloma. UPPER ABDOMEN: Included view of the abdomen is unremarkable. MUSCULOSKELETAL: Nonacute. Moderate mid and lower thoracic ventral endplate spurring. Review of the MIP images confirms the above findings. IMPRESSION: 1. Bilateral acute pulmonary emboli, occlusive within multiple segmental and subsegmental branches. No RIGHT heart strain. 2. Mild cardiomegaly and pulmonary venous  congestion. 3. **An incidental finding of potential clinical significance has been found. 4 cm aneurysmal ascending aorta. Recommend annual imaging followup by CTA or MRA. This recommendation follows 2010 ACCF/AHA/AATS/ACR/ASA/SCA/SCAI/SIR/STS/SVM Guidelines for the Diagnosis and Management of Patients with Thoracic Aortic Disease. Circulation. 2010; 121: Z610-R604** 4. Critical Value/emergent results were discussed by Dr. Oleta Mouse, radiology at the time of interpretation on 03/27/2018 at 5:00 pm to Dr. Donalee Citrin , who verbally acknowledged these results. Electronically Signed   By: Awilda Metro M.D.   On: 03/27/2018 17:04   Ct Cervical Spine Wo Contrast  Result Date: 03/22/2018 CLINICAL DATA:  Right-sided numbness and tingling since 2016. Patient fell on Monday. Myelopathy. EXAM: CT CERVICAL SPINE WITHOUT CONTRAST TECHNIQUE: Multidetector CT imaging of the cervical spine was performed without intravenous contrast. Multiplanar CT image reconstructions were also generated. COMPARISON:  Same day MRI FINDINGS: Alignment: Slight reversal cervical lordosis. Intact craniocervical relationship and atlantodental interval. No listhesis. Skull base and vertebrae: The vertebral body heights are maintained without evidence of acute fracture. No suspicious osseous lesions. Soft tissues and spinal canal: C3 through C6 ossification of the posterior longitudinal ligament most prominent posterior to C3 and C4 markedly the narrowing the lumen of the central canal. This is most evident posterior to C3-4 where there is left central ossification occupying up to 75% of the AP dimension on the left. Disc levels: C2-C3: Left paracentral disc protrusion impressing upon the ventral aspect of the thecal sac. Moderate left-sided spinal stenosis. Patent neural foramina. C3-C4: Moderate disc flattening with left paracentral ossification of the posterior longitudinal ligament impressing upon the thecal sac and cord on the left up to 50%  canal stenosis due to the ossified longitudinal ligament on the left. Bilateral uncovertebral joint hypertrophy with uncinate spurring. Mild right and moderate left foraminal stenosis. C4-C5: Prominent left broad based central ossified posterior longitudinal ligament with underlying diffuse disc bulge and uncovertebral joint osteoarthritis. Severe spinal stenosis up to 50% narrowing the AP dimension. Moderate left L5 foraminal stenosis. C5-C6: Central to left central disc herniation with partially calcified disc impressing upon the thecal sac. Resultant mild spinal stenosis. Slight bilateral foraminal encroachment from uncinate spurring. C6-C7: Negative without significant disc herniation identified. Shoulder artifacts limit assessment. C7-T1: Negative given limitations of streak artifacts from the patient's shoulders. Upper chest: Negative. 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 No acute cervical spine fracture. Electronically Signed   By: Tollie Eth M.D.   On: 03/22/2018 21:41   Mr Cervical Spine Wo Contrast  Result Date: 03/22/2018 CLINICAL DATA:  Initial evaluation for acute right upper and lower extremity weakness, fall. EXAM: MRI CERVICAL AND LUMBAR SPINE WITHOUT CONTRAST TECHNIQUE: Multiplanar and multiecho pulse sequences of the cervical spine, to include the craniocervical junction and cervicothoracic junction, and lumbar spine, were obtained without intravenous contrast. COMPARISON:  None. FINDINGS: MRI CERVICAL SPINE FINDINGS Alignment: Straightening with slight reversal of the normal cervical lordosis. No listhesis. Vertebrae: Vertebral body heights  are maintained without evidence for acute or chronic fracture. No definite fracture seen within the cervical spine status post recent trauma. Bone marrow signal intensity diffusely decreased on T1 weighted imaging, suspected to be related to body  habitus. No discrete or worrisome osseous lesions. Mild reactive edema about the C3-4 interspace favored to be degenerative in nature. Cord: Linear T1/T2 hypointensity within the left ventral epidural space extending from C3 through C5 favored to reflect ossification of the posterior longitudinal ligament. Resultant severe spinal stenosis at C3-4 through C5-6. Abnormal T2 signal within the cervical spinal cord extending from C3 through C6 consistent with cord contusion/edema. Changes more pronounced at the right aspect of the cord. Possible small focus of associated hemorrhage at the level of C5-6 (series 6, image 8). Posterior Fossa, vertebral arteries, paraspinal tissues: Visualized brain and posterior fossa within normal limits. Craniocervical junction normal. Paraspinous and prevertebral soft tissues within normal limits. No findings to suggest acute ligamentous injury. Normal intravascular flow voids present within the vertebral arteries bilaterally. Disc levels: Underlying congenital spinal stenosis. C2-C3: Left paracentral disc protrusion indents the ventral thecal sac, flattening the left hemi cord. No cord signal changes. Moderate left-sided spinal stenosis. Neural foramina remain patent. C3-C4: Left paracentral osseous spur/ossification of the posterior longitudinal ligament. Underlying mild diffuse disc bulge with bilateral uncovertebral hypertrophy. Resultant severe spinal stenosis with spinal cord compression, most notable on the left. Thecal sac measures 3 mm in AP diameter at its most narrow point. Abnormal cord edema. Mild right with moderate left C4 foraminal stenosis. C4-C5: Left paracentral osseous spurs/ossification of the posterior longitudinal ligament. Underlying mild diffuse disc bulge with uncovertebral hypertrophy. Severe spinal stenosis with compression of the cervical spinal cord. Abnormal cord signal intensity. Moderate left C5 foraminal stenosis. C5-C6: Diffuse disc bulge with bilateral  uncovertebral hypertrophy. Bulging disc slightly asymmetric to the left. Resultant mild spinal stenosis with bilateral foraminal narrowing. C6-C7: Mild disc bulge with uncovertebral hypertrophy. No significant stenosis. C7-T1:  Unremarkable. Visualized upper thoracic spine demonstrates no significant finding. MRI LUMBAR SPINE FINDINGS Segmentation: Normal segmentation. Lowest well-formed disc labeled the L5-S1 level. Alignment: Mild scoliosis. Vertebral bodies otherwise normally aligned with preservation of the normal lumbar lordosis. No listhesis. Vertebrae: Vertebral body heights maintained without evidence for acute or chronic fracture. Underlying bone marrow signal intensity diffusely decreased on T1 weighted imaging, suspected to be related to body habitus. No discrete or worrisome osseous lesions. Reactive endplate changes with associated Schmorl's nodes present about the L4-5 and L5-S1 interspaces. Conus medullaris and cauda equina: Conus extends to the L2 level. Conus and cauda equina appear normal. Paraspinal and other soft tissues: Paraspinous soft tissues demonstrate no acute finding. Visualized visceral structures grossly unremarkable. Disc levels: Mild diffuse congenital shortening of the pedicles. L1-2:  Unremarkable. L2-3: Mild diffuse disc bulge. Superimposed shallow right foraminal disc protrusion contacts the exiting right L2 nerve root. Mild facet ligament flavum hypertrophy. No significant spinal stenosis. Mild right neural foraminal narrowing. L3-4: Mild diffuse disc bulge with intervertebral disc space narrowing and disc desiccation. Mild to moderate facet ligament flavum hypertrophy. Short pedicles with resultant mild spinal stenosis. Mild bilateral L3 foraminal narrowing. L4-5: Mild diffuse disc bulge with intervertebral disc space narrowing. Chronic reactive endplate changes with marginal endplate spurring. Mild facet ligament flavum hypertrophy. Mild epidural lipomatosis. Short pedicles  with resultant mild canal and bilateral L4 foraminal narrowing. L5-S1: Broad posterior disc bulge with intervertebral disc space narrowing and disc desiccation. Chronic reactive endplate changes. Moderate right with mild left facet hypertrophy. Epidural lipomatosis.  No significant stenosis. 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 Abnormal cord signal extending from C3 through C6 consistent with cord edema/contusion. Neuro surgical consultation recommended. MRI LUMBAR SPINE IMPRESSION 1. No acute abnormality within is the lumbar spine. 2. Shallow right foraminal disc protrusion at L2-3, contacting and potentially irritating the exiting right L2 nerve root. 3. 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 4. Prominent right-sided facet hypertrophy at L5-S1, which could contribute to lower back pain. Findings communicated to Dr. Erma Heritage at 6:47 p.m. on 03/22/2018. Electronically Signed   By: Rise Mu M.D.   On: 03/22/2018 18:51   Mr Lumbar Spine Wo Contrast  Result Date: 03/22/2018 CLINICAL DATA:  Initial evaluation for acute right upper and lower extremity weakness, fall. EXAM: MRI CERVICAL AND LUMBAR SPINE WITHOUT CONTRAST TECHNIQUE: Multiplanar and multiecho pulse sequences of the cervical spine, to include the craniocervical junction and cervicothoracic junction, and lumbar spine, were obtained without intravenous contrast. COMPARISON:  None. FINDINGS: MRI CERVICAL SPINE FINDINGS Alignment: Straightening with slight reversal of the normal cervical lordosis. No listhesis. Vertebrae: Vertebral body heights are maintained without evidence for acute or chronic fracture. No definite fracture seen within the cervical spine status post recent trauma. Bone marrow signal intensity diffusely decreased on T1 weighted imaging, suspected to be related to body  habitus. No discrete or worrisome osseous lesions. Mild reactive edema about the C3-4 interspace favored to be degenerative in nature. Cord: Linear T1/T2 hypointensity within the left ventral epidural space extending from C3 through C5 favored to reflect ossification of the posterior longitudinal ligament. Resultant severe spinal stenosis at C3-4 through C5-6. Abnormal T2 signal within the cervical spinal cord extending from C3 through C6 consistent with cord contusion/edema. Changes more pronounced at the right aspect of the cord. Possible small focus of associated hemorrhage at the level of C5-6 (series 6, image 8). Posterior Fossa, vertebral arteries, paraspinal tissues: Visualized brain and posterior fossa within normal limits. Craniocervical junction normal. Paraspinous and prevertebral soft tissues within normal limits. No findings to suggest acute ligamentous injury. Normal intravascular flow voids present within the vertebral arteries bilaterally. Disc levels: Underlying congenital spinal stenosis. C2-C3: Left paracentral disc protrusion indents the ventral thecal sac, flattening the left hemi cord. No cord signal changes. Moderate left-sided spinal stenosis. Neural foramina remain patent. C3-C4: Left paracentral osseous spur/ossification of the posterior longitudinal ligament. Underlying mild diffuse disc bulge with bilateral uncovertebral hypertrophy. Resultant severe spinal stenosis with spinal cord compression, most notable on the left. Thecal sac measures 3 mm in AP diameter at its most narrow point. Abnormal cord edema. Mild right with moderate left C4 foraminal stenosis. C4-C5: Left paracentral osseous spurs/ossification of the posterior longitudinal ligament. Underlying mild diffuse disc bulge with uncovertebral hypertrophy. Severe spinal stenosis with compression of the cervical spinal cord. Abnormal cord signal intensity. Moderate left C5 foraminal stenosis. C5-C6: Diffuse disc bulge with bilateral  uncovertebral hypertrophy. Bulging disc slightly asymmetric to the left. Resultant mild spinal stenosis with bilateral foraminal narrowing. C6-C7: Mild disc bulge with uncovertebral hypertrophy. No significant stenosis. C7-T1:  Unremarkable. Visualized upper thoracic spine demonstrates no significant finding. MRI LUMBAR SPINE FINDINGS Segmentation: Normal segmentation. Lowest well-formed disc labeled the L5-S1 level. Alignment: Mild scoliosis. Vertebral bodies otherwise normally aligned with preservation of the normal lumbar lordosis. No listhesis. Vertebrae: Vertebral body heights maintained without evidence for acute or chronic fracture. Underlying bone marrow signal intensity diffusely decreased  on T1 weighted imaging, suspected to be related to body habitus. No discrete or worrisome osseous lesions. Reactive endplate changes with associated Schmorl's nodes present about the L4-5 and L5-S1 interspaces. Conus medullaris and cauda equina: Conus extends to the L2 level. Conus and cauda equina appear normal. Paraspinal and other soft tissues: Paraspinous soft tissues demonstrate no acute finding. Visualized visceral structures grossly unremarkable. Disc levels: Mild diffuse congenital shortening of the pedicles. L1-2:  Unremarkable. L2-3: Mild diffuse disc bulge. Superimposed shallow right foraminal disc protrusion contacts the exiting right L2 nerve root. Mild facet ligament flavum hypertrophy. No significant spinal stenosis. Mild right neural foraminal narrowing. L3-4: Mild diffuse disc bulge with intervertebral disc space narrowing and disc desiccation. Mild to moderate facet ligament flavum hypertrophy. Short pedicles with resultant mild spinal stenosis. Mild bilateral L3 foraminal narrowing. L4-5: Mild diffuse disc bulge with intervertebral disc space narrowing. Chronic reactive endplate changes with marginal endplate spurring. Mild facet ligament flavum hypertrophy. Mild epidural lipomatosis. Short pedicles  with resultant mild canal and bilateral L4 foraminal narrowing. L5-S1: Broad posterior disc bulge with intervertebral disc space narrowing and disc desiccation. Chronic reactive endplate changes. Moderate right with mild left facet hypertrophy. Epidural lipomatosis. No significant stenosis. 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 Abnormal cord signal extending from C3 through C6 consistent with cord edema/contusion. Neuro surgical consultation recommended. MRI LUMBAR SPINE IMPRESSION 1. No acute abnormality within is the lumbar spine. 2. Shallow right foraminal disc protrusion at L2-3, contacting and potentially irritating the exiting right L2 nerve root. 3. 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 4. Prominent right-sided facet hypertrophy at L5-S1, which could contribute to lower back pain. Findings communicated to Dr. Erma Heritage at 6:47 p.m. on 03/22/2018. Electronically Signed   By: Rise Mu M.D.   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 He has been referred for IVC filter placement. EXAM: ULTRASOUND GUIDED ACCESS RIGHT COMMON FEMORAL VEIN CAVAGRAM IVC FILTER PLACEMENT MEDICATIONS: None. ANESTHESIA/SEDATION: None FLUOROSCOPY TIME:  Fluoroscopy Time: 0 minutes 46 seconds (153 mGy). COMPLICATIONS: None PROCEDURE: The procedure, risks, benefits, and alternatives were explained to the patient. Specific risks discussed include bleeding, infection, contrast reaction, renal failure, IVC filter fracture, migration, ileo caval thrombus (3% incidence), need for further procedure, need for further surgery, pulmonary embolism, cardiopulmonary collapse, death. Questions  regarding the procedure were encouraged and answered. The patient understands and consents to the procedure. Ultrasound survey was performed with images stored and sent to PACs. The right inguinal region was prepped with chlorhexidine in a sterile fashion, and a sterile drape was applied covering the operative field. A sterile gown and sterile gloves were used for the procedure. Local anesthesia was provided with 1% Lidocaine. A single wall needle was used access the right common femoral vein under ultrasound. With excellent venous blood flow returned, in 035 wire passed through the needle into the IVC. Small incision was made with an 11 blade scalpel. The needle was removed. The delivery sheath for a retrievable Bard Denali filter was passed over the Bentson wire into the IVC. The wire was removed and small contrast was used to confirm IVC location. IVC cavagram performed. Dilator was removed, and the IVC filter was then delivered, positioned below the lowest  renal vein, approximately L2 level. Repeat cavagram performed, and the catheter was removed. Manual pressure was used for hemostasis. Patient tolerated the procedure well and remained hemodynamically stable throughout. No complications were encountered and no significant blood loss was encounter. IMPRESSION: Status post placement of IVC filter via right common femoral approach. Signed, Yvone Neu. Reyne Dumas, RPVI [facility] Radiology PLAN: This IVC filter is potentially retrievable. The patient will be assessed for filter retrieval by Interventional Radiology in approximately 8-12 weeks. Further recommendations regarding filter retrieval, continued surveillance or declaration of device permanence, will be made at that time. Electronically Signed   By: Gilmer Mor D.O.   On: 03/28/2018 17:42   Dg Chest Port 1 View  Result Date: 03/25/2018 CLINICAL DATA:  Short of breath with chest pain. EXAM: PORTABLE CHEST  1 VIEW COMPARISON:  None. FINDINGS: Support apparatus projects over the apices. Mildly degraded exam due to AP portable technique and patient body habitus. Patient rotated to the right. Normal heart size for level of inspiration. No pleural effusion or pneumothorax. Clear lungs. IMPRESSION: No acute cardiopulmonary disease. Electronically Signed   By: Jeronimo Greaves M.D.   On: 03/25/2018 23:21   Dg C-arm 1-60 Min  Result Date: 03/24/2018 CLINICAL DATA:  Cervical fusion. EXAM: DG CERVICAL SPINE - 1 VIEW; DG C-ARM 61-120 MIN COMPARISON:  Cervical spine CT 03/22/2018. FINDINGS: 2 intraoperative spot fluoro films are submitted. Images show C4 corpectomy with cage placement. Anterior cervical plate extends from C3 to C5. Fine bone detail not well demonstrated due to fluoroscopy. IMPRESSION: Intraoperative assessment during C4 corpectomy and anterior fusion from C3-C5. Electronically Signed   By: Kennith Center M.D.   On: 03/24/2018 12:16    ASSESSMENT & PLAN:   52 year old male with  #1 bilateral lower extremity DVT and bilateral pulmonary embolism Likely triggered by more immobility and postoperative status. His morbid obesity is also had an additional risk factor. .Body mass index is 45.29 kg/m. Patient denies being a smoker.   #2 postoperative surgical site bleeding on IV heparin started about day 3 postoperatively. This can be related to local risks of bleeding with early therapeutic anticoagulation. The risk of bleeding in the setting is higher with the use of bolus IV heparin and typically we prefer not to bolus with IV heparin in the setting.  #3 bleeding on Coumadin in the setting of rapidly increasing INR levels. Patient was started on an aggressive dose of Coumadin based on his body weight and had a rapid bump in his PT/INR which tends to correlate with increased risk of bleeding. This could have been if the patient has vitamin K deficiency or liver disease or is just excessively sensitive  to Coumadin based on liver enzyme.  Plan -We will ultrasound upper extremities to rule out DVT in upper extremities as well due to significant swelling. -I discussed the risks and benefits of anticoagulation and different approaches to treatment with the patient. -Since his INR is down to 2 we will start the patient on a bariatric prophylactic dose of Lovenox at 60 mg every 12 hours subcutaneously. -If INR continues to not trend down appropriately will order 1 dose of vitamin K 2.5 mg orally to avoid 0 order kinetics with the Coumadin. -If the patient tolerates a week of high prophylactic dose of Lovenox without bleeding issues and there is adequate surgical recovery can transition him to ELiquis 5mg  po BID (without the loading dose) -- mindful that his weight is close to  upper limits of study at 130kg - might need diuresis -- will defer to primary team. -will keep IVC filter in for now and would reassess clot burden after tolerating therapeutic anticoagulation for 8-12 weeks prior to removing IVC filter.  All of the patients questions were answered with apparent satisfaction. The patient knows to call the clinic with any problems, questions or concerns.  I spent 60 minutes counseling the patient face to face. The total time spent in the appointment was 80 minutes and more than 50% was on counseling and direct patient cares.    Wyvonnia Lora MD MS AAHIVMS [facility] Hematology/Oncology Physician [facility]  (Office):       [phone] (Work cell):  [phone] (Fax):           [phone]  04/03/2018 6:07 PM

## 2018-04-04 ENCOUNTER — Inpatient Hospital Stay (HOSPITAL_COMMUNITY)

## 2018-04-04 DIAGNOSIS — I82409 Acute embolism and thrombosis of unspecified deep veins of unspecified lower extremity: Secondary | ICD-10-CM

## 2018-04-04 DIAGNOSIS — M7989 Other specified soft tissue disorders: Secondary | ICD-10-CM

## 2018-04-04 DIAGNOSIS — R58 Hemorrhage, not elsewhere classified: Secondary | ICD-10-CM

## 2018-04-04 LAB — PROTIME-INR
INR: 2.25
PROTHROMBIN TIME: 24.7 s — AB (ref 11.4–15.2)

## 2018-04-04 MED ORDER — ENOXAPARIN SODIUM 60 MG/0.6ML ~~LOC~~ SOLN
60.0000 mg | Freq: Two times a day (BID) | SUBCUTANEOUS | Status: DC
  Administered 2018-04-04 – 2018-04-05 (×3): 60 mg via SUBCUTANEOUS
  Filled 2018-04-04 (×3): qty 0.6

## 2018-04-04 MED ORDER — PHYTONADIONE 5 MG PO TABS
2.5000 mg | ORAL_TABLET | Freq: Once | ORAL | Status: AC
  Administered 2018-04-04: 2.5 mg via ORAL
  Filled 2018-04-04: qty 1

## 2018-04-04 NOTE — Progress Notes (Signed)
Subjective: Patient reports no oozing from site. Hematology saw him yesterday.   Objective: Vital signs in last 24 hours: Temp:  [97.6 F (36.4 C)-98.5 F (36.9 C)] 97.6 F (36.4 C) (07/23 0500) Pulse Rate:  [42-83] 44 (07/23 0500) Resp:  [12-19] 15 (07/23 0500) BP: (129-145)/(75-86) 134/84 (07/23 0500) SpO2:  [91 %-98 %] 98 % (07/23 0500)  Intake/Output from previous day: 07/22 0701 - 07/23 0700 In: 2047.2 [P.O.:600; I.V.:1447.2] Out: 2850 [Urine:2850] Intake/Output this shift: No intake/output data recorded.  Neurologic: Grossly normal  Lab Results: Lab Results  Component Value Date   WBC 12.2 (H) 04/03/2018   HGB 13.4 04/03/2018   HCT 41.9 04/03/2018   MCV 90.9 04/03/2018   PLT 116 (L) 04/03/2018   Lab Results  Component Value Date   INR 2.25 04/04/2018   BMET Lab Results  Component Value Date   NA 135 04/03/2018   K 5.0 04/03/2018   CL 103 04/03/2018   CO2 26 04/03/2018   GLUCOSE 130 (H) 04/03/2018   BUN 36 (H) 04/03/2018   CREATININE 1.39 (H) 04/03/2018   CALCIUM 8.3 (L) 04/03/2018    Studies/Results: No results found.  Assessment/Plan: Per hem note, will start lovenox once INR is below 2. Continue to assess for bleeding at incision site. Per patient they are going to doppler his upper extremities today.    LOS: 13 days    Tiana LoftKimberly Hannah Hig[facility] General HospitalMeyran 04/04/2018, 7:50 AM

## 2018-04-04 NOTE — Progress Notes (Signed)
Hematology Short Note  Patient seen last last night. B/l LE DVT with b/l PE with some segmental/subsegmental obstruction. No pulmonary infarction. Likely triggered by post op state and relative immobility from C spine injury. No FHX or previous h/o VTE.  Not to have post-operative hematoma in the setting of rapidly increasing PT/INR on aggressive dose of coumadin + thrombocytopenia. Needed I&D of hematoma.  IVC filter placed. INR peaked at around 5 and today down to 2-2.5 range.   Discussed pros vs cons and consideration of anticoagulation approaches with the patient. He is still quite immobile and at very high risk of clot progression so no anticoagulation approach would be high risk. Also neurosurgery team notes post surgical hemostasis achieved but increased risk of bleeding. Platelets improving.  Plan -will plan to start on bariatric dose prophylactic lovenox (HIT Ab neg) at 60mg  Robins q12h from 04/04/2018 AM once INR dips below 2. -this would hopefully prevent significant clot progression and tilt balance toward clot dissolution gradually. -if no issues with surigcal site or other bleeding issues in 1-2 weeks could transition to coumadin starting at lower doses of 2.5-5mg  po daily and adjusting up gradually with lovenox bridge OR could consider switching to a NOAC like Eliquis- without a loading dose (However the patients weight is >130kg and there is risk for underdosing with standard dose).  .Body mass index is 45.29 kg/m. . Wt Readings from Last 3 Encounters:  03/23/18 (!) 306 lb 10.6 oz (139.1 kg)   -full consult will be dictated.  [name] LoraGautam Chesley Valls MD MS

## 2018-04-05 DIAGNOSIS — G8389 Other specified paralytic syndromes: Secondary | ICD-10-CM

## 2018-04-05 LAB — CBC WITH DIFFERENTIAL/PLATELET
Abs Immature Granulocytes: 0.1 10*3/uL (ref 0.0–0.1)
BASOS ABS: 0 10*3/uL (ref 0.0–0.1)
Basophils Relative: 0 %
EOS ABS: 0 10*3/uL (ref 0.0–0.7)
EOS PCT: 0 %
HCT: 39.9 % (ref 39.0–52.0)
Hemoglobin: 13 g/dL (ref 13.0–17.0)
IMMATURE GRANULOCYTES: 1 %
LYMPHS ABS: 1.4 10*3/uL (ref 0.7–4.0)
Lymphocytes Relative: 11 %
MCH: 29.7 pg (ref 26.0–34.0)
MCHC: 32.6 g/dL (ref 30.0–36.0)
MCV: 91.1 fL (ref 78.0–100.0)
Monocytes Absolute: 0.9 10*3/uL (ref 0.1–1.0)
Monocytes Relative: 7 %
NEUTROS ABS: 10.5 10*3/uL — AB (ref 1.7–7.7)
NEUTROS PCT: 81 %
PLATELETS: 154 10*3/uL (ref 150–400)
RBC: 4.38 MIL/uL (ref 4.22–5.81)
RDW: 13 % (ref 11.5–15.5)
WBC: 12.9 10*3/uL — ABNORMAL HIGH (ref 4.0–10.5)

## 2018-04-05 LAB — PROTIME-INR
INR: 1.58
Prothrombin Time: 18.8 seconds — ABNORMAL HIGH (ref 11.4–15.2)

## 2018-04-05 MED ORDER — DOXYCYCLINE HYCLATE 100 MG PO TABS
100.0000 mg | ORAL_TABLET | Freq: Two times a day (BID) | ORAL | Status: DC
  Administered 2018-04-05 – 2018-04-09 (×9): 100 mg via ORAL
  Filled 2018-04-05 (×10): qty 1

## 2018-04-05 NOTE — Progress Notes (Signed)
Inpatient Rehabilitation-Admissions Coordinator    Met with patient at the bedside to discuss team's recommendation for inpatient rehabilitation. Shared booklets, expectations while in CIR, expected length of stay, and anticipated functional level at DC. Pt appreciated the information provided.   Pt not medically ready for CIR at this time and understands his candidacy for rehab depends on many factors, including his functional status once medically appropriate. AC will continue to follow pt progress.    Please call if questions.   Jhonnie Garner, OTR/L  Rehab Admissions Coordinator  [phone] 04/05/2018 2:44 PM

## 2018-04-05 NOTE — Consult Note (Signed)
Physical Medicine and Rehabilitation Consult Reason for Consult: Decreased functional mobility Referring Physician: [facility]   HPI: [name] [name] is a 52 y.o. right-handed male with history of hypertension, pernicious anemia, morbid obesity with BMI 45.29, gout as well as long-standing history of low back pain.  Per chart review patient lives alone.  Plans to stay with his parents on discharge and assistance as needed.  One level home with 5 steps to entry.  Presented 03/22/2018 with decrease in balance and numbness in his hand primarily on the right and dragging of his right leg.  He had had a recent fall x2 where he fell and struck his head and neck.  Patient also notes recent increase in constipation as well as bouts of urinary retention.  Cranial CT scan showed no acute intracranial process.  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  Underwent anterior cervical corpectomy of C4, microscopic dissection and foraminotomies of the C4 and C5 nerve roots 03/24/2018 per Dr. Wynetta Emery.  Cervical collar was placed postoperatively.  Hospital course pain management.  Rapid response 03/25/2018 after patient unresponsive after transferring from chair to bed and heart rate in the 30s.  Patient initially hypoxic during event placed on NRB and responded well after a NS bolus of 250 cc.  Patient again on 7/15 with increasing shortness of breath CT angiogram of the chest showed bilateral acute pulmonary emboli, occlusive within multiple segmental and subsegmental branches.  Venous Doppler studies positive for acute DVT involving bilateral posterior tibial and peroneal veins.  Placed on intravenous heparin.  Echocardiogram with ejection fraction of 55% grade 2 diastolic dysfunction.  Underwent placement of IVC filter per interventional radiology 03/28/2018 as well as reexploration of anterior cervical wound following initial heparin anticoagulation and no active  bleeding noted.  Follow-up oncology services in regards to anticoagulation in light of DVT/PE with work-up currently ongoing.  Physical and occupational therapy evaluations completed with recommendations of physical medicine rehab consult.  \Patient states that he was very active prior to admission he was working at the [place].  There is deficits came on "overnight"  Review of Systems  Constitutional: Negative for chills and fever.  HENT: Negative for hearing loss.   Eyes: Negative for blurred vision and double vision.  Respiratory: Positive for shortness of breath.   Cardiovascular: Positive for leg swelling.  Gastrointestinal: Positive for constipation. Negative for nausea and vomiting.  Genitourinary:       Some urinary retention  Musculoskeletal: Positive for back pain, myalgias and neck pain.  Skin: Negative for rash.  Neurological: Positive for sensory change and focal weakness. Negative for seizures.  All other systems reviewed and are negative.  Past Medical History:  Diagnosis Date  . Degenerative disc disease, lumbar   . Gout   . History of pernicious anemia 1980   Patient says he takes OTC iron supplements QD  . Hypertension    Past Surgical History:  Procedure Laterality Date  . ANTERIOR CERVICAL CORPECTOMY N/A 03/24/2018   Procedure: CERVICAL FOUR ANTERIOR CERVICAL CORPECTOMY;  Surgeon: Donalee Citrin, MD;  Location: [facility] OR;  Service: Neurosurgery;  Laterality: N/A;  . ANTERIOR CERVICAL DECOMP/DISCECTOMY FUSION N/A 03/28/2018   Procedure: Reexploration of anterior cervical wound;  Surgeon: Donalee Citrin, MD;  Location: [facility] OR;  Service: Neurosurgery;  Laterality: N/A;  . IR IVC FILTER PLMT / S&I Lenise Arena GUID/MOD SED  03/28/2018  . MENISCUS REPAIR Left   . TENDON REPAIR  tricept tendon   History reviewed. No pertinent family history. Social History:  reports that he has never smoked. He has never used smokeless tobacco. He reports that he does not drink alcohol or use  drugs. Allergies:  Allergies  Allergen Reactions  . Nsaids Other (See Comments)    G6PD  . Sulfa Antibiotics Other (See Comments)    G6PD   Medications Prior to Admission  Medication Sig Dispense Refill  . naproxen sodium (ALEVE) 220 MG tablet Take 220 mg by mouth 2 (two) times daily as needed (pain).      Home: Home Living Family/patient expects to be discharged to:: Private residence Living Arrangements: Parent, Alone(was living alone at extended stay, says he can stay with parents at discharge) Available Help at Discharge: Family Type of Home: House Home Access: Stairs to enter Entergy CorporationEntrance Stairs-Number of Steps: 5 in front, 3 in garage Entrance Stairs-Rails: Right Home Layout: One level Bathroom Shower/Tub: Engineer, manufacturing systemsTub/shower unit Bathroom Toilet: Standard Home Equipment: None  Functional History: Prior Function Level of Independence: Independent 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[employer], was CNA Functional Status:  Mobility: Bed Mobility Overal bed mobility: Needs Assistance Bed Mobility: Rolling, Sidelying to Sit Rolling: Mod assist Sidelying to sit: Mod assist, +2 for safety/equipment, HOB elevated General bed mobility comments: use of bed pad to bring hips EOB, log rolling cues, assist for trunk elevation Transfers Overall transfer level: Needs assistance Equipment used: Rolling walker (2 wheeled) Transfers: Sit to/from Stand Sit to Stand: Mod assist, +2 safety/equipment Stand pivot transfers: Min assist, +2 safety/equipment General transfer comment: mod A +2 for safety with initial boost (vc for safe hand placement) min A +2 once up on feet Ambulation/Gait Ambulation/Gait assistance: Min assist Gait Distance (Feet): 5 Feet Assistive device: Rolling walker (2 wheeled) Gait Pattern/deviations: Step-to pattern, Step-through pattern, Decreased stride length, Narrow base of support, Scissoring, Shuffle, Decreased dorsiflexion - left, Decreased dorsiflexion - right General Gait  Details: Cues to self-monitor for activity tolerance; short, inefficient steps with dependence on RW for support Gait velocity: decreased Gait velocity interpretation: <1.8 ft/sec, indicate of risk for recurrent falls    ADL: ADL Overall ADL's : Needs assistance/impaired Eating/Feeding: Independent, Sitting Eating/Feeding Details (indicate cue type and reason): with increased time, says he cannot cut up food, but can use spoon and fork with increased time--prefers finger food right now. Did issue pt a rocker knife Grooming: Set up, Sitting Grooming Details (indicate cue type and reason): with increased time Upper Body Bathing: Set up, Supervision/ safety, Sitting Upper Body Bathing Details (indicate cue type and reason): with increased time Lower Body Bathing: Minimal assistance Lower Body Bathing Details (indicate cue type and reason): min A sit<>stand Upper Body Dressing : Minimal assistance, Sitting Upper Body Dressing Details (indicate cue type and reason): with increased time Lower Body Dressing: Moderate assistance Lower Body Dressing Details (indicate cue type and reason): min A sit<>stand Toilet Transfer: Moderate assistance, +2 for safety/equipment, BSC, RW Toilet Transfer Details (indicate cue type and reason): mod A +2 for safety with initial boost (vc for safe hand placement) min A +2 once up on feet Toileting- Clothing Manipulation and Hygiene: Maximal assistance, +2 for safety/equipment, Sit to/from stand Toileting - Clothing Manipulation Details (indicate cue type and reason): for rear peri care Functional mobility during ADLs: Minimal assistance, +2 for safety/equipment, Rolling walker General ADL Comments: reviewed cervical precautions with Pt  Cognition: Cognition Overall Cognitive Status: Within Functional Limits for tasks assessed Orientation Level: Oriented X4 Cognition Arousal/Alertness: Awake/alert Behavior During Therapy:  WFL for tasks  assessed/performed Overall Cognitive Status: Within Functional Limits for tasks assessed  Blood pressure 115/74, pulse (!) 45, temperature (!) 97.5 F (36.4 C), temperature source Oral, resp. rate 13, height 5\' 9"  (1.753 m), weight (!) 139.1 kg (306 lb 10.6 oz), SpO2 96 %. Physical Exam  Vitals reviewed. Constitutional: He is oriented to person, place, and time.  HENT:  Head: Normocephalic.  Eyes: EOM are normal.  Neck:  Cervical collar in place  Cardiovascular: Normal rate and regular rhythm.  Respiratory: Effort normal and breath sounds normal. No respiratory distress.  GI: Soft. Bowel sounds are normal. He exhibits no distension.  Neurological: He is alert and oriented to person, place, and time.  Skin: Skin is warm and dry.  Motor strength is 3- at the right deltoid biceps triceps grip 5/5 in the left deltoid bicep tricep grip 3- bilateral hip flexors knee extensors 4- ankle dorsiflexors Sensation intact light touch bilateral upper and lower limbs.   Results for orders placed or performed during the hospital encounter of 03/22/18 (from the past 24 hour(s))  Protime-INR     Status: Abnormal   Collection Time: 04/05/18  3:07 AM  Result Value Ref Range   Prothrombin Time 18.8 (H) 11.4 - 15.2 seconds   INR 1.58    No results found.   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 Does the need for close, 24 hr/day medical supervision in concert with the patient's rehab needs make it unreasonable for this patient to be served in a less intensive setting? Yes 2. Co-Morbidities requiring supervision/potential complications: Morbid obesity pulmonary embolism 3. Due to bladder management, bowel management, safety, skin/wound care, disease management, medication administration, pain management and patient education, does the patient require 24 hr/day rehab nursing? Yes 4. Does the  patient require coordinated care of a physician, rehab nurse, PT (1-2 hrs/day, 5 days/week) and OT (1-2 hrs/day, 5 days/week) to address physical and functional deficits in the context of the above medical diagnosis(es)? Yes Addressing deficits in the following areas: balance, endurance, locomotion, strength, transferring, bowel/bladder control, bathing, dressing, feeding, grooming, toileting and psychosocial support 5. Can the patient actively participate in an intensive therapy program of at least 3 hrs of therapy per day at least 5 days per week? Yes 6. The potential for patient to make measurable gains while on inpatient rehab is excellent 7. Anticipated functional outcomes upon discharge from inpatient rehab are modified independent and supervision  with PT, modified independent and supervision with OT, n/a with SLP. 8. Estimated rehab length of stay to reach the above functional goals is: 10 to 14 days 9. Anticipated D/C setting: Home 10. Anticipated post D/C treatments: HH therapy 11. Overall Rehab/Functional Prognosis: excellent  RECOMMENDATIONS: This patient's condition is appropriate for continued rehabilitative care in the following setting: CIR Patient has agreed to participate in recommended program. Yes Note that insurance prior authorization may be required for reimbursement for recommended care.  Comment: Need to address anticoagulation management prior to rehab "I have personally performed a face to face diagnostic evaluation of this patient.  Additionally, I have reviewed and concur with the physician assistant's documentation above." Erick Colace M.D. [facility] FAAPM&R (Sports Med, Neuromuscular Med) Diplomate Am Board of Electrodiagnostic Med  Lynnae Prude 04/05/2018

## 2018-04-05 NOTE — Progress Notes (Signed)
Called Wynetta EmeryCram about bleeding from site and bloog in stool new orders to reenforce dry dressing, DC lovenox, Doxycycline 100mg  po BID and CBC with Diff and platelets

## 2018-04-05 NOTE — Progress Notes (Signed)
Occupational Therapy Treatment Patient Details Name: [name] [name] MRN: [id number] DOB: 10-22-1965 Today's Date: 04/05/2018    History of present illness Patient is a 52 y/o male with PMH of HTN, DDD lumbar spine and gout.  He was admitted after a fall at home with inability to get up.  Reports one other recent fall when his dog pulled him over.  States recent R hand numbness,tingling and imbalance.  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  Now s/p anterior postion of surgery complete; pan for posterio on 7/15; Recovery complicated by 2 syncopal episodes and found to have bilat DVT with PE.  With anticoagulation developed some oozing and hematoma return to OR 7/16 to drain hematoma.   OT comments  Pt progressing towards OT goals at this time, he remains motivated and willing to work with therapy in any capacity. Limited session due to increased bleeding from anterior incision site as well as incontinence of stool with bleeding in stool and over pad. Patient with elevate HR during mobility 160s, dropped to 70s at rest. O2 saturations stable, patient denies dizziness. Current POC remains appropriate, OT will continue to follow acutely until medically appropriate for CIR level therapy.    Follow Up Recommendations  CIR;Supervision - Intermittent    Equipment Recommendations  3 in 1 bedside commode    Recommendations for Other Services      Precautions / Restrictions Precautions Precautions: Fall Required Braces or Orthoses: Cervical Brace Cervical Brace: Hard collar;At all times Restrictions Other Position/Activity Restrictions: cervical - watch wound       Mobility Bed Mobility Overal bed mobility: Needs Assistance Bed Mobility: Rolling;Sidelying to Sit Rolling: Min assist Sidelying to sit: Mod assist       General bed mobility comments: HOB elevated, cues for transitional movement to EOB with mindfulness of cervical precuations  Transfers Overall transfer level:  Needs assistance Equipment used: Rolling walker (2 wheeled) Transfers: Sit to/from Stand Sit to Stand: Min assist;+2 physical assistance;+2 safety/equipment;From elevated surface         General transfer comment: elevated surface, increased time and effort to perform +2 for safety during power up to standing    Balance Overall balance assessment: Needs assistance Sitting-balance support: Feet supported Sitting balance-Leahy Scale: Good     Standing balance support: Bilateral upper extremity supported Standing balance-Leahy Scale: Poor Standing balance comment: Reliance on bilateral UE support to maintain upright                           ADL either performed or assessed with clinical judgement   ADL Overall ADL's : Needs assistance/impaired     Grooming: Set up;Sitting                   Toilet Transfer: Moderate assistance;+2 for safety/equipment;BSC;RW Toilet Transfer Details (indicate cue type and reason): mod A +2 for safety with initial boost (vc for safe hand placement) min A +2 once up on feet Toileting- Clothing Manipulation and Hygiene: Maximal assistance;+2 for safety/equipment;Sit to/from stand Toileting - Clothing Manipulation Details (indicate cue type and reason): for rear peri care     Functional mobility during ADLs: Minimal assistance;+2 for safety/equipment;Rolling walker       Vision       Perception     Praxis      Cognition Arousal/Alertness: Awake/alert Behavior During Therapy: WFL for tasks assessed/performed Overall Cognitive Status: Within Functional Limits for tasks assessed  Exercises     Shoulder Instructions       General Comments increased time at EOB for self care and hygiene. Patient also with bleeding from anterior cervical incision. additionally, blood on pad from rectal bleeding during incontinence    Pertinent Vitals/ Pain       Pain Assessment:  No/denies pain  Home Living                                          Prior Functioning/Environment              Frequency  Min 3X/week        Progress Toward Goals  OT Goals(current goals can now be found in the care plan section)  Progress towards OT goals: Progressing toward goals  Acute Rehab OT Goals Patient Stated Goal: to return to independent OT Goal Formulation: With patient Time For Goal Achievement: 04/06/18 Potential to Achieve Goals: Good  Plan Discharge plan remains appropriate;Frequency remains appropriate    Co-evaluation    PT/OT/SLP Co-Evaluation/Treatment: Yes Reason for Co-Treatment: Complexity of the patient's impairments (multi-system involvement);For patient/therapist safety;To address functional/ADL transfers PT goals addressed during session: Mobility/safety with mobility;Proper use of DME;Strengthening/ROM OT goals addressed during session: ADL's and self-care;Proper use of Adaptive equipment and DME      AM-PAC PT "6 Clicks" Daily Activity     Outcome Measure   Help from another person eating meals?: None Help from another person taking care of personal grooming?: A Little Help from another person toileting, which includes using toliet, bedpan, or urinal?: A Little Help from another person bathing (including washing, rinsing, drying)?: A Lot Help from another person to put on and taking off regular upper body clothing?: A Lot Help from another person to put on and taking off regular lower body clothing?: A Lot 6 Click Score: 16    End of Session Equipment Utilized During Treatment: Gait belt;Rolling walker;Cervical collar  OT Visit Diagnosis: Unsteadiness on feet (R26.81);Other abnormalities of gait and mobility (R26.89);Repeated falls (R29.6);Muscle weakness (generalized) (M62.81);History of falling (Z91.81);Hemiplegia and hemiparesis Hemiplegia - Right/Left: Right Hemiplegia - dominant/non-dominant:  Dominant Hemiplegia - caused by: Unspecified   Activity Tolerance Patient tolerated treatment well   Patient Left in chair;with call bell/phone within reach   Nurse Communication Mobility status;Precautions(bleeding at incision, and in stool)        Time: 2952-84131330-1357 OT Time Calculation (min): 27 min  Charges: OT General Charges $OT Visit: 1 Visit OT Treatments $Self Care/Home Management : 8-22 mins  Sherryl MangesLaura Briany Aye OTR/L [phone]   Evern BioLaura J Kimberley Dastrup 04/05/2018, 6:33 PM

## 2018-04-05 NOTE — Progress Notes (Signed)
Physical Therapy Treatment Patient Details Name: [name] [name] MRN: [id number] DOB: Jun 28, 1966 Today's Date: 04/05/2018    History of Present Illness Patient is a 52 y/o male with PMH of HTN, DDD lumbar spine and gout.  He was admitted after a fall at home with inability to get up.  Reports one other recent fall when his dog pulled him over.  States recent R hand numbness,tingling and imbalance.  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  Now s/p anterior postion of surgery complete; pan for posterio on 7/15; Recovery complicated by 2 syncopal episodes and found to have bilat DVT with PE.  With anticoagulation developed some oozing and hematoma return to OR 7/16 to drain hematoma.    PT Comments    Patient seen for activity progression. Patient tolerated EOB and OOB to chair with minimal ambulation. Limited session due to increased bleeding from anterior incision site as well as incontinence of stool with bleeding in stool and over pad. Patient with elevate HR during mobility 160s, dropped to 70s at rest. O2 saturations stable, patient denies dizziness. Current POC remains appropriate. Will continue to see and progress as tolerated.   Follow Up Recommendations  CIR;Supervision/Assistance - 24 hour     Equipment Recommendations  Rolling walker with 5" wheels;3in1 (PT)    Recommendations for Other Services Rehab consult(Dr. Wynetta Emery indicated he will put in the order)     Precautions / Restrictions Precautions Precautions: Fall Required Braces or Orthoses: Cervical Brace Cervical Brace: Hard collar;At all times Restrictions Other Position/Activity Restrictions: cervical - watch wound    Mobility  Bed Mobility Overal bed mobility: Needs Assistance Bed Mobility: Rolling;Sidelying to Sit Rolling: Min assist Sidelying to sit: Mod assist       General bed mobility comments: HOB elevated, cues for transitional movement to EOB with mindfulness of cervical  precuations  Transfers Overall transfer level: Needs assistance Equipment used: Rolling walker (2 wheeled) Transfers: Sit to/from Stand Sit to Stand: Min assist;+2 physical assistance;+2 safety/equipment;From elevated surface         General transfer comment: elevated surface, increased time and effort to perform +2 for safety during power up to standing  Ambulation/Gait Ambulation/Gait assistance: Min assist Gait Distance (Feet): 8 Feet Assistive device: Rolling walker (2 wheeled) Gait Pattern/deviations: Step-to pattern;Step-through pattern;Decreased stride length;Narrow base of support;Scissoring;Shuffle;Decreased dorsiflexion - left;Decreased dorsiflexion - right Gait velocity: decreased Gait velocity interpretation: <1.8 ft/sec, indicate of risk for recurrent falls General Gait Details: VCs for hand placement and positioning, increased time and effort during transition to upright   Stairs             Wheelchair Mobility    Modified Rankin (Stroke Patients Only)       Balance Overall balance assessment: Needs assistance Sitting-balance support: Feet supported Sitting balance-Leahy Scale: Good     Standing balance support: Bilateral upper extremity supported Standing balance-Leahy Scale: Poor Standing balance comment: Reliance on bilateral UE support to maintain upright                            Cognition Arousal/Alertness: Awake/alert Behavior During Therapy: WFL for tasks assessed/performed Overall Cognitive Status: Within Functional Limits for tasks assessed                                        Exercises General Exercises - Lower Extremity Ankle Circles/Pumps:  AROM;10 reps;Both;Supine    General Comments General comments (skin integrity, edema, etc.): increased time at EOB for self care and hygiene. Patient also with bleeding from anterior cervical incision. additionally, blood on pad from rectal bleeding during  incontinence      Pertinent Vitals/Pain Pain Assessment: No/denies pain    Home Living                      Prior Function            PT Goals (current goals can now be found in the care plan section) Acute Rehab PT Goals Patient Stated Goal: to return to independent PT Goal Formulation: With patient Time For Goal Achievement: 04/06/18 Potential to Achieve Goals: Good Progress towards PT goals: Progressing toward goals    Frequency    Min 4X/week      PT Plan Current plan remains appropriate    Co-evaluation PT/OT/SLP Co-Evaluation/Treatment: Yes Reason for Co-Treatment: Complexity of the patient's impairments (multi-system involvement) PT goals addressed during session: Mobility/safety with mobility OT goals addressed during session: ADL's and self-care      AM-PAC PT "6 Clicks" Daily Activity  Outcome Measure  Difficulty turning over in bed (including adjusting bedclothes, sheets and blankets)?: A Lot Difficulty moving from lying on back to sitting on the side of the bed? : Unable Difficulty sitting down on and standing up from a chair with arms (e.g., wheelchair, bedside commode, etc,.)?: Unable Help needed moving to and from a bed to chair (including a wheelchair)?: A Little Help needed walking in hospital room?: A Lot Help needed climbing 3-5 steps with a railing? : Total 6 Click Score: 10    End of Session Equipment Utilized During Treatment: Gait belt;Cervical collar Activity Tolerance: Patient tolerated treatment well Patient left: in chair;with call bell/phone within reach Nurse Communication: Mobility status PT Visit Diagnosis: Other abnormalities of gait and mobility (R26.89);Muscle weakness (generalized) (M62.81);History of falling (Z91.81)     Time: [id number] PT Time Calculation (min) (ACUTE ONLY): 23 min  Charges:  $Therapeutic Activity: 8-22 mins                    G Codes:       Charlotte Crumbevon Deavion Strider, PT DPT  Board Certified  Neurologic Specialist [phone]    [name] [name] 04/05/2018, 2:16 PM

## 2018-04-06 LAB — PROTIME-INR
INR: 1.18
PROTHROMBIN TIME: 14.9 s (ref 11.4–15.2)

## 2018-04-06 MED ORDER — DEXAMETHASONE 2 MG PO TABS
2.0000 mg | ORAL_TABLET | Freq: Three times a day (TID) | ORAL | Status: DC
  Administered 2018-04-06 – 2018-04-11 (×15): 2 mg via ORAL
  Filled 2018-04-06 (×16): qty 1

## 2018-04-06 NOTE — Progress Notes (Signed)
Patient ID: [name] [name], male   DOB: [birth year], 52 y.o.   MRN: [id number] Overall patient doing well neurologically stable hemodynamics was stable to start oozing from his incision again yesterday afternoon so we discontinued Lovenox. Currently this morning there is no drainage from his incision.  Neurologically unchanged  Will discuss with hematology role of any coagulation at this point. It does not seem that the patient tolerates any level of any correlation without drainage from his incision. He remains neurologically intact and improved from preop. His follow-up CBC showed platelet counts up to 154. INR is down to 1.18.

## 2018-04-06 NOTE — PMR Pre-admission (Signed)
PMR Admission Coordinator Pre-Admission Assessment  Patient: [name] [name] is an 52 y.o., male MRN: [id number] DOB: November 23, 1965 Height: 5\' 9"  (175.3 cm) Weight: (!) 139.1 kg (306 lb 10.6 oz)              Insurance Information HMO:     PPO: Yes     PCP:      IPA:      80/20:      OTHER:  PRIMARY: Designer, multimedia (Plan Type: [employer] Active [employer] Fam ([employer]FM))      Policy#: [id number], Group A Field seismologist)      Subscriber: Patient CM Name: Approval reported by Darl Pikes (per Darl Pikes, if pt requires more than 14 day stay, pt will be assigned a CM to follow)     Phone#: [phone]     Fax#: [phone] Pre-Cert#: (pt's Worthy Rancher #: [id number]) Auth#: [id number] provided by Darl Pikes on 04/10/18      Employer:  Pt is approved for 14 days with update due 8/13 via fax  Benefits:  Phone #: NA     Name: Online Portal: [email or web address] Eff. Date: 09/13/16     Deduct: $0      Out of Pocket Max: (Catastrophic Cap: $1,000)      Life Max: NA CIR: $0, 100% coverage      SNF: $0, 100% coverage with referral  Outpatient: with referral     Co-Pay: $0/visit Home Health: $0, 100% coverage with referral      Co-Pay:  DME: $0, 100% coverage with referral     Co-Pay:  Providers:   Medicaid Application Date:       Case Manager:  Disability Application Date:       Case Worker:   Emergency Contact Information Contact Information    Name Relation Home Work [place], [place] Mother   [phone]   [place], [place] Father   [phone]   Pete, Schnitzer   [phone]     Current Medical History  Patient Admitting Diagnosis: Cervical myelopathy with right upper extremity and bilateral lower extremity weakness OPLL status post C3-C5 ACDF  History of Present Illness: [name] Arlineis a 52 y.o.right-handed malewith history of hypertension, pernicious anemia, morbid obesity with BMI 45.29, gout as well as long-standing history of low back pain.Per chart review patient lives alone. Plans to  stay with his parents on discharge and assistance as needed. One level home with 5 steps to entry.Presented 03/22/2018 with decrease in balance and numbness in his hand primarily on the right and dragging of his right leg. He had had a recent fall x2 where he fell and struck his head and neck. Patient also notes recent increase in constipation as well as bouts of urinary retention. Cranial CT scan showed no acute intracranial process. MRI of thoracic lumbar spine showed severe cord compression from OPLLextending from C3-4 down to C4-5. Underwent anterior cervical corpectomy of C4, microscopic dissection and foraminotomies of the C4 and C5 nerve roots 03/24/2018 per Dr. Wynetta Emery. Cervical collar was placed postoperatively. Hospital course pain management. Rapid response 03/25/2018 after patient unresponsive after transferring from chair to bed and heart rate in the 30s. Patient initially hypoxic during event placed on NRB and responded well after a NS bolus of 250 cc. Patient again on 7/15 with increasing shortness of breath CT angiogram of the chest showed bilateral acute pulmonary emboli, occlusive within multiple segmental and subsegmental branches. Venous Doppler studies positive for acute DVT involving bilateral posterior tibial and peroneal veins. Placed on intravenous  heparin. Echocardiogram with ejection fraction of 55% grade 2 diastolic dysfunction. Underwent placement of IVC filter per interventional radiology 03/28/2018 as well as reexploration of anterior cervical wound following initial heparin anticoagulation and no active bleeding noted. Follow-up oncology services in regards to anticoagulation in light of DVT/PE with work-up currently ongoing. Physical and occupational therapy evaluations completed with recommendations of physical medicine rehab consult. Pt is to be admitted to CIR on 04/11/18        Past Medical History  Past Medical History:  Diagnosis Date  . Degenerative disc  disease, lumbar   . Gout   . History of pernicious anemia 1980   Patient says he takes OTC iron supplements QD  . Hypertension     Family History  family history is not on file.  Prior Rehab/Hospitalizations:  Has the patient had major surgery during 100 days prior to admission? No  Current Medications   Current Facility-Administered Medications:  .  0.9 %  sodium chloride infusion (Manually program via Guardrails IV Fluids), , Intravenous, Once, Meyran, Tiana Loft, NP .  acetaminophen (TYLENOL) tablet 650 mg, 650 mg, Oral, Q4H PRN, 650 mg at 04/05/18 2002 **OR** acetaminophen (TYLENOL) suppository 650 mg, 650 mg, Rectal, Q4H PRN, Donalee Citrin, MD .  alum & mag hydroxide-simeth (MAALOX/MYLANTA) 200-200-20 MG/5ML suspension 30 mL, 30 mL, Oral, Q6H PRN, Donalee Citrin, MD, 30 mL at 04/02/18 1117 .  aspirin tablet 325 mg, 325 mg, Oral, Daily, Donalee Citrin, MD, 325 mg at 04/10/18 1058 .  baclofen (LIORESAL) tablet 10 mg, 10 mg, Oral, BID, Donalee Citrin, MD, 10 mg at 04/10/18 1058 .  [COMPLETED] 6 CHG cloth bath night before surgery, , , Once **AND** 6 CHG cloth bath AM of surgery, , , Once **AND** Chlorhexidine Gluconate Cloth 2 % PADS 6 each, 6 each, Topical, Once **AND** [COMPLETED] Chlorhexidine Gluconate Cloth 2 % PADS 6 each, 6 each, Topical, Once, Donalee Citrin, MD, 6 each at 03/23/18 2124 .  cyclobenzaprine (FLEXERIL) tablet 10 mg, 10 mg, Oral, TID PRN, Donalee Citrin, MD, 10 mg at 04/09/18 1802 .  dexamethasone (DECADRON) tablet 2 mg, 2 mg, Oral, Q8H, Donalee Citrin, MD, 2 mg at 04/10/18 0616 .  docusate sodium (COLACE) capsule 100 mg, 100 mg, Oral, BID, Donalee Citrin, MD, 100 mg at 04/08/18 2133 .  doxycycline (VIBRA-TABS) tablet 100 mg, 100 mg, Oral, Q12H, Donalee Citrin, MD, 100 mg at 04/09/18 2134 .  enoxaparin (LOVENOX) injection 40 mg, 40 mg, Subcutaneous, Q24H, Johney Maine, MD, 40 mg at 04/09/18 2134 .  furosemide (LASIX) tablet 20 mg, 20 mg, Oral, Daily, Johney Maine, MD, 20 mg at  04/10/18 1058 .  HYDROmorphone (DILAUDID) injection 1 mg, 1 mg, Intravenous, Q2H PRN, Donalee Citrin, MD, 1 mg at 03/28/18 0649 .  lidocaine (XYLOCAINE) 1 % (with pres) injection, , , PRN, Gilmer Mor, DO, 10 mL at 03/28/18 1634 .  losartan (COZAAR) tablet 25 mg, 25 mg, Oral, Daily, Wendall Stade, MD, 25 mg at 04/09/18 1137 .  menthol-cetylpyridinium (CEPACOL) lozenge 3 mg, 1 lozenge, Oral, PRN, 3 mg at 03/27/18 0940 **OR** phenol (CHLORASEPTIC) mouth spray 1 spray, 1 spray, Mouth/Throat, PRN, Donalee Citrin, MD, 1 spray at 03/26/18 0140 .  ondansetron (ZOFRAN) tablet 4 mg, 4 mg, Oral, Q6H PRN, 4 mg at 04/04/18 1354 **OR** ondansetron (ZOFRAN) injection 4 mg, 4 mg, Intravenous, Q6H PRN, Donalee Citrin, MD, 4 mg at 03/26/18 0300 .  oxyCODONE (Oxy IR/ROXICODONE) immediate release tablet 10 mg, 10 mg, Oral, Q3H PRN,  Donalee Citrin, MD, 10 mg at 04/10/18 [phone] .  pantoprazole (PROTONIX) EC tablet 40 mg, 40 mg, Oral, Daily, Donalee Citrin, MD, 40 mg at 04/10/18 1058 .  zolpidem (AMBIEN) tablet 5 mg, 5 mg, Oral, QHS PRN, Barnett Abu, MD, 5 mg at 03/29/18 0038  Patients Current Diet:  Diet Order           Diet regular Room service appropriate? 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, was CNA  Self Care: Did the patient need help bathing, dressing, using the toilet or eating?  Independent  Indoor Mobility: Did the patient need assistance with walking from room to room (with or without device)? Independent  Stairs: Did the patient need assistance with internal or external stairs (with or without device)? Independent  Functional Cognition: Did the patient need help planning regular tasks such as shopping or remembering to take medications? Independent  Current Functional Level Cognition  Overall Cognitive Status: Within Functional Limits for tasks assessed Orientation Level: Oriented X4    Extremity Assessment (includes Sensation/Coordination)  Upper Extremity Assessment: RUE deficits/detail RUE Deficits / Details: fatigues when pressing through on RW RUE Sensation: decreased light touch RUE Coordination: decreased fine motor LUE Deficits / Details: WNL LUE Sensation: WNL LUE Coordination: WNL  Lower Extremity Assessment: RLE deficits/detail, LLE deficits/detail RLE Deficits / Details: AAROM WFL, strength hip flexion 2+/4, knee extension 4-/5, ankle DF 4+/5; sensation decreased to temp and light touch throughout  RLE Sensation: decreased light touch LLE Deficits / Details: AROM WFL, strength hip flexion 4-/5, knee extension 5/5, ankle DF 5/5; sensation decreased to light touch, but feels temp LLE Sensation: decreased light touch    ADLs  Overall ADL's : Needs assistance/impaired Eating/Feeding: Independent, Sitting Eating/Feeding Details (indicate cue type and reason): with increased time, says he cannot cut up food, but can use spoon and fork with increased time--prefers finger food right now. Did issue pt a rocker knife Grooming: Wash/dry hands, Wash/dry face, Set up, Sitting Grooming Details (indicate cue type and reason): with increased time Upper Body Bathing: Set up, Supervision/ safety, Sitting Upper Body Bathing Details  (indicate cue type and reason): with increased time Lower Body Bathing: Minimal assistance Lower Body Bathing Details (indicate cue type and reason): min A sit<>stand Upper Body Dressing : Minimal assistance, Sitting Upper Body Dressing Details (indicate cue type and reason): with increased time Lower Body Dressing: Moderate assistance Lower Body Dressing Details (indicate cue type and reason): min A sit<>stand Toilet Transfer: Minimal assistance, +2 for safety/equipment, Ambulation, RW, Grab bars, Comfort height toilet Toilet Transfer Details (indicate cue type and reason): into bathroom, cues for safety Toileting- Clothing Manipulation and Hygiene: Maximal assistance, Sit to/from stand Toileting - Clothing Manipulation Details (indicate cue type and reason): for rear peri care Functional mobility during  ADLs: Minimal assistance, +2 for safety/equipment, Rolling walker General ADL Comments: reviewed cervical precautions with Pt    Mobility  Overal bed mobility: Needs Assistance Bed Mobility: Rolling, Sidelying to Sit Rolling: Min assist Sidelying to sit: Min assist General bed mobility comments: pt struggled and needed stability assist; cues for technique    Transfers  Overall transfer level: Needs assistance Equipment used: Rolling walker (2 wheeled) Transfers: Sit to/from Stand Sit to Stand: Min assist, +2 physical assistance Stand pivot transfers: Min assist, +2 safety/equipment General transfer comment: cues for hand placement    Ambulation / Gait / Stairs / Wheelchair Mobility  Ambulation/Gait Ambulation/Gait assistance: Min assist, +2 physical assistance Gait Distance (Feet): 130 Feet Assistive device: Rolling walker (2 wheeled) Gait Pattern/deviations: Step-through pattern General Gait Details: heavy use of the RW.  Pt able to stand upright to cue today.  Unsteady at R knee.  Weak gastrocsoleus bil Gait velocity: decreased Gait velocity interpretation: <1.31 ft/sec,  indicative of household ambulator    Posture / Balance Balance Overall balance assessment: Needs assistance Sitting-balance support: Feet supported Sitting balance-Leahy Scale: Good Standing balance support: Bilateral upper extremity supported Standing balance-Leahy Scale: Poor Standing balance comment: Reliance on bilateral UE support to maintain upright    Special needs/care consideration BiPAP/CPAP: no CPM: no Continuous Drip IV: No Dialysis:no        Days:NA Life Vest: No Oxygen: No Special Bed: No Trach Size: no Wound Vac (area): No      Location: no Skin: Surgical incision on anterior neck, closed incision on anterior groin, Left elbow skin tear from fall prior to admission; moisture associated skin damage (stage 2 bilateral buttocks breakdown) with therapy requesting geomat.                        Bowel mgmt:Last BM 04/10/18; typically continent, but episodes of incontinence recently Bladder mgmt: external catheter in place Diabetic mgmt: no *Therapy is requesting GeoMat in recliner due to discomfort in sitting and risk of skin breakdown      Previous Home Environment Living Arrangements: Parent, Alone(was living alone at extended stay, says he can stay with parents at discharge) Available Help at Discharge: Family Type of Home: House Home Layout: One level Home Access: Stairs to enter Entrance Stairs-Rails: Right Entrance Stairs-Number of Steps: 5 in front, 3 in garage Bathroom Shower/Tub: Engineer, manufacturing systemsTub/shower unit Bathroom Toilet: Standard Home Care Services: No  Discharge Living Setting Plans for Discharge Living Setting: Other (Comment)(pt plans to DC to his parents house in [place]) Type of Home at Discharge: House Discharge Home Layout: One level Discharge Home Access: Stairs to enter Entrance Stairs-Rails: Can reach both Entrance Stairs-Number of Steps: 5 Discharge Bathroom Shower/Tub: Tub/shower unit Discharge Bathroom Toilet: Handicapped height Discharge  Bathroom Accessibility: Yes How Accessible: Accessible via walker Does the patient have any problems obtaining your medications?: No  Social/Family/Support Systems Patient Roles: Parent, Other (Comment)(going through divorce; has 7yo son; works Teacher, 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[employer] as NA) SolicitorContact Information: his parents are his emergency contact Anticipated Caregiver: parents: Harden MoMamie (mother) [phone]; father Gerlene Burdock(Richard) [phone] Anticipated Caregiver's Contact Information: see above for his parents  Ability/Limitations of Caregiver: Parents are able to provide physical assistance as needed Caregiver Availability: 24/7 Discharge Plan Discussed with Primary Caregiver: Yes Is Caregiver In Agreement with Plan?: Yes Does Caregiver/Family have Issues with Lodging/Transportation while Pt is in Rehab?: No   Goals/Additional Needs Patient/Family Goal for Rehab: PT/OT: Mod I/Supervision; SLP: NA Expected length of stay: 10-14 days Cultural  Considerations: NA Dietary Needs: Diet Regular, thin liquinds Equipment Needs: TBD Pt/Family Agrees to Admission and willing to participate: Yes Program Orientation Provided & Reviewed with Pt/Caregiver Including Roles  & Responsibilities: Yes(reviewed with pt and his parents)  Barriers to Discharge: Other (comments)(will be staying with his parents in their house at DC)   Decrease burden of Care through IP rehab admission: NA   Possible need for SNF placement upon discharge:Not anticipated as pt has good family support and excellent prognosis for progress.    Patient Condition: This patient's medical and functional status has changed since the consult dated: 04/04/18 in which the Rehabilitation Physician determined and documented that the patient's condition is appropriate for intensive rehabilitative care in an inpatient rehabilitation facility. Medical changes are: draining from surgical site with changes in medications as well as hematology/oncology following up for  coagulation needs.  Functional changes are: Min A x2 for 36 feet and Min A x2 for sit to stand transfers. After evaluating the patient today and speaking with the Rehabilitation physician and acute team, the patient remains appropriate for inpatient rehab. Will admit to inpatient rehab today.  Preadmission Screen Completed By:  Nanine Means, 04/10/2018 1:12 PM ______________________________________________________________________   Discussed status with Dr. Riley Kill  on 04/11/18 at 2:15PM and received telephone approval for admission today.  Admission Coordinator:  Nanine Means, time 2:15PM Dorna Bloom 04/11/18

## 2018-04-07 DIAGNOSIS — Z882 Allergy status to sulfonamides status: Secondary | ICD-10-CM

## 2018-04-07 DIAGNOSIS — I82409 Acute embolism and thrombosis of unspecified deep veins of unspecified lower extremity: Secondary | ICD-10-CM

## 2018-04-07 DIAGNOSIS — L7622 Postprocedural hemorrhage and hematoma of skin and subcutaneous tissue following other procedure: Secondary | ICD-10-CM

## 2018-04-07 DIAGNOSIS — Z886 Allergy status to analgesic agent status: Secondary | ICD-10-CM

## 2018-04-07 DIAGNOSIS — Y839 Surgical procedure, unspecified as the cause of abnormal reaction of the patient, or of later complication, without mention of misadventure at the time of the procedure: Secondary | ICD-10-CM

## 2018-04-07 LAB — PROTIME-INR
INR: 1.07
Prothrombin Time: 13.8 seconds (ref 11.4–15.2)

## 2018-04-07 LAB — CBC
HEMATOCRIT: 37.9 % — AB (ref 39.0–52.0)
Hemoglobin: 12 g/dL — ABNORMAL LOW (ref 13.0–17.0)
MCH: 29.3 pg (ref 26.0–34.0)
MCHC: 31.7 g/dL (ref 30.0–36.0)
MCV: 92.4 fL (ref 78.0–100.0)
Platelets: 143 10*3/uL — ABNORMAL LOW (ref 150–400)
RBC: 4.1 MIL/uL — ABNORMAL LOW (ref 4.22–5.81)
RDW: 13.3 % (ref 11.5–15.5)
WBC: 14.4 10*3/uL — ABNORMAL HIGH (ref 4.0–10.5)

## 2018-04-07 LAB — FIBRINOGEN: Fibrinogen: 376 mg/dL (ref 210–475)

## 2018-04-07 MED ORDER — FUROSEMIDE 20 MG PO TABS
20.0000 mg | ORAL_TABLET | Freq: Every day | ORAL | Status: DC
  Administered 2018-04-07 – 2018-04-11 (×5): 20 mg via ORAL
  Filled 2018-04-07 (×5): qty 1

## 2018-04-07 MED ORDER — ASPIRIN 325 MG PO TABS
325.0000 mg | ORAL_TABLET | Freq: Every day | ORAL | Status: DC
  Administered 2018-04-07 – 2018-04-11 (×5): 325 mg via ORAL
  Filled 2018-04-07 (×5): qty 1

## 2018-04-07 MED ORDER — ENOXAPARIN SODIUM 40 MG/0.4ML ~~LOC~~ SOLN
40.0000 mg | SUBCUTANEOUS | Status: DC
  Administered 2018-04-07 – 2018-04-09 (×3): 40 mg via SUBCUTANEOUS
  Filled 2018-04-07 (×3): qty 0.4

## 2018-04-07 NOTE — Progress Notes (Signed)
Physical Therapy Treatment Patient Details Name: [name] [name] MRN: [id number] DOB: August 17, 1966 Today's Date: 04/07/2018    History of Present Illness Patient is a 52 y/o male with PMH of HTN, DDD lumbar spine and gout.  He was admitted after a fall at home with inability to get up.  Reports one other recent fall when his dog pulled him over.  States recent R hand numbness,tingling and imbalance.  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  Now s/p anterior postion of surgery complete; pan for posterio on 7/15; Recovery complicated by 2 syncopal episodes and found to have bilat DVT with PE.  With anticoagulation developed some oozing and hematoma return to OR 7/16 to drain hematoma.    PT Comments    Pt showed good progress today.  Emphasis on progression of activity, transfers and gait in the  RW.  Follow Up Recommendations  CIR;Supervision/Assistance - 24 hour     Equipment Recommendations  Rolling walker with 5" wheels;3in1 (PT)    Recommendations for Other Services Rehab consult     Precautions / Restrictions Precautions Precautions: Fall Required Braces or Orthoses: Cervical Brace Cervical Brace: Hard collar;At all times Restrictions Other Position/Activity Restrictions: cervical - watch wound    Mobility  Bed Mobility               General bed mobility comments: pt in the chair on arrival  Transfers Overall transfer level: Needs assistance Equipment used: Rolling walker (2 wheeled) Transfers: Sit to/from Stand Sit to Stand: Min assist;+2 physical assistance;+2 safety/equipment;From elevated surface         General transfer comment: cues for hand placement assist to come forward and boost  Ambulation/Gait Ambulation/Gait assistance: Min assist;+2 safety/equipment Gait Distance (Feet): 36 Feet Assistive device: Rolling walker (2 wheeled) Gait Pattern/deviations: Step-through pattern Gait velocity: decreased Gait velocity interpretation: <1.31  ft/sec, indicative of household ambulator General Gait Details: step through gait with heavy use of the RW.  Forward posture, no sagging with either LE.  Noticeable fatigue.   Stairs             Wheelchair Mobility    Modified Rankin (Stroke Patients Only)       Balance Overall balance assessment: Needs assistance Sitting-balance support: Feet supported Sitting balance-Leahy Scale: Good     Standing balance support: Bilateral upper extremity supported Standing balance-Leahy Scale: Poor Standing balance comment: Reliance on bilateral UE support to maintain upright                            Cognition Arousal/Alertness: Awake/alert Behavior During Therapy: WFL for tasks assessed/performed Overall Cognitive Status: Within Functional Limits for tasks assessed                                        Exercises      General Comments        Pertinent Vitals/Pain Pain Assessment: Faces Faces Pain Scale: No hurt    Home Living                      Prior Function            PT Goals (current goals can now be found in the care plan section) Acute Rehab PT Goals Patient Stated Goal: to return to independent PT Goal Formulation: With patient Time For Goal Achievement:  04/13/18(continue with same goals) Potential to Achieve Goals: Good Progress towards PT goals: Progressing toward goals    Frequency    Min 4X/week      PT Plan Current plan remains appropriate    Co-evaluation              AM-PAC PT "6 Clicks" Daily Activity  Outcome Measure  Difficulty turning over in bed (including adjusting bedclothes, sheets and blankets)?: A Lot Difficulty moving from lying on back to sitting on the side of the bed? : Unable Difficulty sitting down on and standing up from a chair with arms (e.g., wheelchair, bedside commode, etc,.)?: Unable Help needed moving to and from a bed to chair (including a wheelchair)?: A  Little Help needed walking in hospital room?: A Little Help needed climbing 3-5 steps with a railing? : Total 6 Click Score: 11    End of Session Equipment Utilized During Treatment: Gait belt Activity Tolerance: Patient tolerated treatment well Patient left: in chair;with call bell/phone within reach Nurse Communication: Mobility status PT Visit Diagnosis: Other abnormalities of gait and mobility (R26.89);Muscle weakness (generalized) (M62.81);History of falling (Z91.81)     Time: [id number] PT Time Calculation (min) (ACUTE ONLY): 24 min  Charges:  $Gait Training: 8-22 mins $Therapeutic Activity: 8-22 mins                     04/07/2018  Coal BingKen Yareni [name], PT [phone]  (pager)   [name] GumKenneth V Jaleya [name] 04/07/2018, 6:13 PM

## 2018-04-07 NOTE — Progress Notes (Signed)
Noted Dr. Lonie Peakram's plans for today. We await hematology follow up. [employer] insurance approval pending. We will follow up on Monday. I have discussed with bedside RN.  Ottie GlazierBarbara Theran Vandergrift, RN, MSN Rehab Admissions Coordinator [phone] 04/07/2018 1:57 PM

## 2018-04-07 NOTE — Progress Notes (Signed)
Subjective: Patient reports From a neurosurgical perspective he has no neck pain neurologically he still improved his biggest complaints are increased looks to be swelling over the last 12-24 hours. That's becoming uncomfortable for him.  Objective: Vital signs in last 24 hours: Temp:  [97.5 F (36.4 C)-98.3 F (36.8 C)] 97.5 F (36.4 C) (07/26 0820) Pulse Rate:  [61-97] 61 (07/26 0820) Resp:  [13-17] 13 (07/26 0820) BP: (114-139)/(79-98) 116/79 (07/26 0820) SpO2:  [95 %-98 %] 97 % (07/26 0820)  Intake/Output from previous day: 07/25 0701 - 07/26 0700 In: -  Out: 650 [Urine:650] Intake/Output this shift: No intake/output data recorded.  Strength improved 4+ out of 5 upper extremity 5 out of 5 lower extremities significant bilateral lower extremities edema  Lab Results: Recent Labs    04/05/18 1435  WBC 12.9*  HGB 13.0  HCT 39.9  PLT 154   BMET No results for input(s): NA, K, CL, CO2, GLUCOSE, BUN, CREATININE, CALCIUM in the last 72 hours.  Studies/Results: No results found.  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 Had a filter placed is not tolerating a coagulation however he has now been 48 hours dry. The biggest difficulty right now is the last dose of Lovenox at 60 subcutaneous twice a day caused using from his cervical incision as well as a lower GI bleed. I've asked hematology to reevaluate lower extremities edema consider either reinitiation of lower dose Lovenox if he could tolerate it both from the lower GI bleed perspective but also the cervical incision perspective and or diuresis. Patient is stable enough to transfer to rehabilitation when rehabilitation is comfortable with his DVT management.  LOS: 16 days     Braydon Kullman P 04/07/2018, 10:50 AM

## 2018-04-07 NOTE — Progress Notes (Signed)
Marland Kitchen   HEMATOLOGY/ONCOLOGY INPATIENT PROGRESS NOTE  Date of Service: 04/07/2018  Inpatient Attending: .Donalee Citrin, MD   SUBJECTIVE  Patient was seen for hematology f/u today. He was apparently noted to have some oozing from neck incision 2 days ago with even bariatric prophylactic dose of lovenox. Some report of some blood in stools but patient not aware of this. Neck incision clean with no oozing for >24h now. Discussed risks vs benefits and started on prophylactic lovenox 40mg  [place] daily today. He is most bothered by lower extremity tightness and swelling which we discussed is multifactorial -- DVT + swelling from steroids/immobility/IVF etc. He is keen to be started on some diuretics.    OBJECTIVE:  NAD  PHYSICAL EXAMINATION: . Vitals:   04/07/18 0400 04/07/18 0820 04/07/18 1118 04/07/18 1709  BP: (!) 139/98 116/79 (!) 130/92 132/89  Pulse:  61 75 64  Resp:  13 14 17   Temp: (!) 97.5 F (36.4 C) (!) 97.5 F (36.4 C) 98 F (36.7 C) 98.5 F (36.9 C)  TempSrc: Oral Oral Oral Oral  SpO2: 95% 97% 96% 96%  Weight:      Height:       Filed Weights   03/22/18 1115 03/23/18 0100  Weight: (!) 305 lb (138.3 kg) (!) 306 lb 10.6 oz (139.1 kg)   .Body mass index is 45.29 kg/m. GENERAL:alert, no acute distress  sKIN: no acute rashes EYES: conjunctiva are pink and non-injected, sclera anicteric OROPHARYNX: MMM NECK: In hard cervical collar, no overt oozing from his surgical incision. steristrips off. LYMPH:  no palpable lymphadenopathy in the cervical, axillary or inguinal regions LUNGS: clear to auscultation b/l with normal respiratory effort HEART: regular rate & rhythm ABDOMEN:  normoactive bowel sounds , non tender, not distended. Extremity: Bilateral upper and lower extremity swelling PSYCH: alert & oriented x 3 with fluent speech NEURO: Per neurosurgery     MEDICAL HISTORY:  Past Medical History:  Diagnosis Date  . Degenerative disc disease, lumbar   . Gout   .  History of pernicious anemia 1980   Patient says he takes OTC iron supplements QD  . Hypertension     SURGICAL HISTORY: Past Surgical History:  Procedure Laterality Date  . ANTERIOR CERVICAL CORPECTOMY N/A 03/24/2018   Procedure: CERVICAL FOUR ANTERIOR CERVICAL CORPECTOMY;  Surgeon: Donalee Citrin, MD;  Location: [facility] OR;  Service: Neurosurgery;  Laterality: N/A;  . ANTERIOR CERVICAL DECOMP/DISCECTOMY FUSION N/A 03/28/2018   Procedure: Reexploration of anterior cervical wound;  Surgeon: Donalee Citrin, MD;  Location: [facility] OR;  Service: Neurosurgery;  Laterality: N/A;  . IR IVC FILTER PLMT / S&I Lenise Arena GUID/MOD SED  03/28/2018  . MENISCUS REPAIR Left   . TENDON REPAIR     tricept tendon    SOCIAL HISTORY: Social History   Socioeconomic History  . Marital status: Legally Separated    Spouse name: Not on file  . Number of children: Not on file  . Years of education: Not on file  . Highest education level: Not on file  Occupational History  . Not on file  Social Needs  . Financial resource strain: Not on file  . Food insecurity:    Worry: Not on file    Inability: Not on file  . Transportation needs:    Medical: Not on file    Non-medical: Not on file  Tobacco Use  . Smoking status: Never Smoker  . Smokeless tobacco: Never Used  Substance and Sexual Activity  . Alcohol use: Never  Frequency: Never  . Drug use: Never  . Sexual activity: Not on file  Lifestyle  . Physical activity:    Days per week: Not on file    Minutes per session: Not on file  . Stress: Not on file  Relationships  . Social connections:    Talks on phone: Not on file    Gets together: Not on file    Attends religious service: Not on file    Active member of club or organization: Not on file    Attends meetings of clubs or organizations: Not on file    Relationship status: Not on file  . Intimate partner violence:    Fear of current or ex partner: Not on file    Emotionally abused: Not on file    Physically  abused: Not on file    Forced sexual activity: Not on file  Other Topics Concern  . Not on file  Social History Narrative  . Not on file    FAMILY HISTORY: History reviewed. No pertinent family history.  ALLERGIES:  is allergic to nsaids and sulfa antibiotics.  MEDICATIONS:  Scheduled Meds: . sodium chloride   Intravenous Once  . aspirin  325 mg Oral Daily  . baclofen  10 mg Oral BID  . Chlorhexidine Gluconate Cloth  6 each Topical Once  . dexamethasone  2 mg Oral Q8H  . docusate sodium  100 mg Oral BID  . doxycycline  100 mg Oral Q12H  . enoxaparin (LOVENOX) injection  40 mg Subcutaneous Q24H  . furosemide  20 mg Oral Daily  . losartan  25 mg Oral Daily  . pantoprazole  40 mg Oral Daily   Continuous Infusions: PRN Meds:.acetaminophen **OR** acetaminophen, alum & mag hydroxide-simeth, cyclobenzaprine, HYDROmorphone (DILAUDID) injection, lidocaine, menthol-cetylpyridinium **OR** phenol, ondansetron **OR** ondansetron (ZOFRAN) IV, oxyCODONE, zolpidem  REVIEW OF SYSTEMS:    10 Point review of Systems was done is negative except as noted above.   LABORATORY DATA:  I have reviewed the data as listed  . CBC Latest Ref Rng & Units 04/07/2018 04/05/2018 04/03/2018  WBC 4.0 - 10.5 K/uL 14.4(H) 12.9(H) 12.2(H)  Hemoglobin 13.0 - 17.0 g/dL 12.0(L) 13.0 13.4  Hematocrit 39.0 - 52.0 % 37.9(L) 39.9 41.9  Platelets 150 - 400 K/uL 143(L) 154 116(L)    . CMP Latest Ref Rng & Units 04/03/2018 03/25/2018 03/22/2018  Glucose 70 - 99 mg/dL 161(W) 960(A) 84  BUN 6 - 20 mg/dL 54(U) 98(J) 10  Creatinine 0.61 - 1.24 mg/dL 1.91(Y) 7.82 9.56  Sodium 135 - 145 mmol/L 135 139 141  Potassium 3.5 - 5.1 mmol/L 5.0 4.0 3.7  Chloride 98 - 111 mmol/L 103 107 106  CO2 22 - 32 mmol/L 26 24 24   Calcium 8.9 - 10.3 mg/dL 8.3(L) 8.8(L) 9.2  Total Protein 6.5 - 8.1 g/dL 5.9(L) - -  Total Bilirubin 0.3 - 1.2 mg/dL 2.1(H) - -  Alkaline Phos 38 - 126 U/L 43 - -  AST 15 - 41 U/L 37 - -  ALT 0 - 44 U/L 65(H)  - -       RADIOGRAPHIC STUDIES: I have personally reviewed the radiological images as listed and agreed with the findings in the report. Dg Cervical Spine 1 View  Result Date: 03/24/2018 CLINICAL DATA:  Cervical fusion. EXAM: DG CERVICAL SPINE - 1 VIEW; DG C-ARM 61-120 MIN COMPARISON:  Cervical spine CT 03/22/2018. FINDINGS: 2 intraoperative spot fluoro films are submitted. Images show C4 corpectomy with cage placement. Anterior cervical  plate extends from C3 to C5. Fine bone detail not well demonstrated due to fluoroscopy. IMPRESSION: Intraoperative assessment during C4 corpectomy and anterior fusion from C3-C5. Electronically Signed   By: Kennith Center M.D.   On: 03/24/2018 12:16   Ct Head Wo Contrast  Result Date: 03/22/2018 CLINICAL DATA:  Right-sided numbness and tingling, recent fall EXAM: CT HEAD WITHOUT CONTRAST TECHNIQUE: Contiguous axial images were obtained from the base of the skull through the vertex without intravenous contrast. COMPARISON:  None. FINDINGS: Brain: The ventricular system is prominent, disproportionate to the level of cortical atrophy. Therefore, normal pressure hydrocephalus would be a consideration if appropriate clinically. The fourth ventricle and basilar cisterns are unremarkable. No hemorrhage, mass lesion, or acute infarction is seen. Vascular: No vascular abnormality is noted on this unenhanced study. Skull: On bone window images, no calvarial abnormality is seen. Sinuses/Orbits: There is retention cyst and possibly small amount of fluid in the right maxillary sinus consistent with right maxillary sinus disease. Mild mucosal thickening is present medially in the left maxillary sinus. The remainder of paranasal sinuses are well pneumatized. Other: None. IMPRESSION: 1. Ventriculomegaly, disproportionate to the degree of atrophy. Consider the possibility of normal pressure hydrocephalus. 2. No acute intracranial abnormality. 3. Right maxillary sinus disease.  Electronically Signed   By: Dwyane Dee M.D.   On: 03/22/2018 14:34   Ct Angio Chest Pe W Or Wo Contrast  Result Date: 03/27/2018 CLINICAL DATA:  Chest pain, recent surgery, DVT. EXAM: CT ANGIOGRAPHY CHEST WITH CONTRAST TECHNIQUE: Multidetector CT imaging of the chest was performed using the standard protocol during bolus administration of intravenous contrast. Multiplanar CT image reconstructions and MIPs were obtained to evaluate the vascular anatomy. CONTRAST:  ISOVUE-370 IOPAMIDOL (ISOVUE-370) INJECTION 76% COMPARISON:  Chest radiograph March 25, 2018. FINDINGS: CARDIOVASCULAR: Adequate contrast opacification of the pulmonary artery's. Main pulmonary artery is not enlarged. RIGHT upper lobar central pulmonary embolism, occlusive within segmental branches. Occlusive RIGHT middle lobe subsegmental pulmonary emboli. Occlusive segmental and subsegmental RIGHT lower lobe pulmonary emboli. Central nonocclusive filling defect LEFT main pulmonary artery casting into the lobar and segmental branches. Heart size is mildly enlarged, no right heart strain (RV/LV .8). No pericardial effusion. Thoracic aorta is normal course, 4 cm ascending aorta. Mild pulmonary venous congestion. MEDIASTINUM/NODES: No lymphadenopathy by CT size criteria. LUNGS/PLEURA: Tracheobronchial tree is patent, no pneumothorax. No pleural effusions, focal consolidations, pulmonary nodules or masses. Bilateral lower lobe atelectasis. Punctate LEFT upper lobe subpleural granuloma. UPPER ABDOMEN: Included view of the abdomen is unremarkable. MUSCULOSKELETAL: Nonacute. Moderate mid and lower thoracic ventral endplate spurring. Review of the MIP images confirms the above findings. IMPRESSION: 1. Bilateral acute pulmonary emboli, occlusive within multiple segmental and subsegmental branches. No RIGHT heart strain. 2. Mild cardiomegaly and pulmonary venous congestion. 3. **An incidental finding of potential clinical significance has been found. 4 cm  aneurysmal ascending aorta. Recommend annual imaging followup by CTA or MRA. This recommendation follows 2010 ACCF/AHA/AATS/ACR/ASA/SCA/SCAI/SIR/STS/SVM Guidelines for the Diagnosis and Management of Patients with Thoracic Aortic Disease. Circulation. 2010; 121: Z610-R604** 4. Critical Value/emergent results were discussed by Dr. Oleta Mouse, radiology at the time of interpretation on 03/27/2018 at 5:00 pm to Dr. Donalee Citrin , who verbally acknowledged these results. Electronically Signed   By: Awilda Metro M.D.   On: 03/27/2018 17:04   Ct Cervical Spine Wo Contrast  Result Date: 03/22/2018 CLINICAL DATA:  Right-sided numbness and tingling since 2016. Patient fell on Monday. Myelopathy. EXAM: CT CERVICAL SPINE WITHOUT CONTRAST TECHNIQUE: Multidetector CT imaging of  the cervical spine was performed without intravenous contrast. Multiplanar CT image reconstructions were also generated. COMPARISON:  Same day MRI FINDINGS: Alignment: Slight reversal cervical lordosis. Intact craniocervical relationship and atlantodental interval. No listhesis. Skull base and vertebrae: The vertebral body heights are maintained without evidence of acute fracture. No suspicious osseous lesions. Soft tissues and spinal canal: C3 through C6 ossification of the posterior longitudinal ligament most prominent posterior to C3 and C4 markedly the narrowing the lumen of the central canal. This is most evident posterior to C3-4 where there is left central ossification occupying up to 75% of the AP dimension on the left. Disc levels: C2-C3: Left paracentral disc protrusion impressing upon the ventral aspect of the thecal sac. Moderate left-sided spinal stenosis. Patent neural foramina. C3-C4: Moderate disc flattening with left paracentral ossification of the posterior longitudinal ligament impressing upon the thecal sac and cord on the left up to 50% canal stenosis due to the ossified longitudinal ligament on the left. Bilateral uncovertebral  joint hypertrophy with uncinate spurring. Mild right and moderate left foraminal stenosis. C4-C5: Prominent left broad based central ossified posterior longitudinal ligament with underlying diffuse disc bulge and uncovertebral joint osteoarthritis. Severe spinal stenosis up to 50% narrowing the AP dimension. Moderate left L5 foraminal stenosis. C5-C6: Central to left central disc herniation with partially calcified disc impressing upon the thecal sac. Resultant mild spinal stenosis. Slight bilateral foraminal encroachment from uncinate spurring. C6-C7: Negative without significant disc herniation identified. Shoulder artifacts limit assessment. C7-T1: Negative given limitations of streak artifacts from the patient's shoulders. Upper chest: Negative. 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 No acute cervical spine fracture. Electronically Signed   By: Tollie Eth M.D.   On: 03/22/2018 21:41   Mr Cervical Spine Wo Contrast  Result Date: 03/22/2018 CLINICAL DATA:  Initial evaluation for acute right upper and lower extremity weakness, fall. EXAM: MRI CERVICAL AND LUMBAR SPINE WITHOUT CONTRAST TECHNIQUE: Multiplanar and multiecho pulse sequences of the cervical spine, to include the craniocervical junction and cervicothoracic junction, and lumbar spine, were obtained without intravenous contrast. COMPARISON:  None. FINDINGS: MRI CERVICAL SPINE FINDINGS Alignment: Straightening with slight reversal of the normal cervical lordosis. No listhesis. Vertebrae: Vertebral body heights are maintained without evidence for acute or chronic fracture. No definite fracture seen within the cervical spine status post recent trauma. Bone marrow signal intensity diffusely decreased on T1 weighted imaging, suspected to be related to body habitus. No discrete or worrisome osseous lesions. Mild reactive edema about the C3-4 interspace  favored to be degenerative in nature. Cord: Linear T1/T2 hypointensity within the left ventral epidural space extending from C3 through C5 favored to reflect ossification of the posterior longitudinal ligament. Resultant severe spinal stenosis at C3-4 through C5-6. Abnormal T2 signal within the cervical spinal cord extending from C3 through C6 consistent with cord contusion/edema. Changes more pronounced at the right aspect of the cord. Possible small focus of associated hemorrhage at the level of C5-6 (series 6, image 8). Posterior Fossa, vertebral arteries, paraspinal tissues: Visualized brain and posterior fossa within normal limits. Craniocervical junction normal. Paraspinous and prevertebral soft tissues within normal limits. No findings to suggest acute ligamentous injury. Normal intravascular flow voids present within the vertebral arteries bilaterally. Disc levels: Underlying congenital spinal stenosis. C2-C3: Left paracentral disc protrusion indents the ventral thecal sac, flattening the left hemi cord. No cord signal changes. Moderate left-sided spinal stenosis. Neural foramina remain patent. C3-C4: Left paracentral osseous spur/ossification  of the posterior longitudinal ligament. Underlying mild diffuse disc bulge with bilateral uncovertebral hypertrophy. Resultant severe spinal stenosis with spinal cord compression, most notable on the left. Thecal sac measures 3 mm in AP diameter at its most narrow point. Abnormal cord edema. Mild right with moderate left C4 foraminal stenosis. C4-C5: Left paracentral osseous spurs/ossification of the posterior longitudinal ligament. Underlying mild diffuse disc bulge with uncovertebral hypertrophy. Severe spinal stenosis with compression of the cervical spinal cord. Abnormal cord signal intensity. Moderate left C5 foraminal stenosis. C5-C6: Diffuse disc bulge with bilateral uncovertebral hypertrophy. Bulging disc slightly asymmetric to the left. Resultant mild spinal  stenosis with bilateral foraminal narrowing. C6-C7: Mild disc bulge with uncovertebral hypertrophy. No significant stenosis. C7-T1:  Unremarkable. Visualized upper thoracic spine demonstrates no significant finding. MRI LUMBAR SPINE FINDINGS Segmentation: Normal segmentation. Lowest well-formed disc labeled the L5-S1 level. Alignment: Mild scoliosis. Vertebral bodies otherwise normally aligned with preservation of the normal lumbar lordosis. No listhesis. Vertebrae: Vertebral body heights maintained without evidence for acute or chronic fracture. Underlying bone marrow signal intensity diffusely decreased on T1 weighted imaging, suspected to be related to body habitus. No discrete or worrisome osseous lesions. Reactive endplate changes with associated Schmorl's nodes present about the L4-5 and L5-S1 interspaces. Conus medullaris and cauda equina: Conus extends to the L2 level. Conus and cauda equina appear normal. Paraspinal and other soft tissues: Paraspinous soft tissues demonstrate no acute finding. Visualized visceral structures grossly unremarkable. Disc levels: Mild diffuse congenital shortening of the pedicles. L1-2:  Unremarkable. L2-3: Mild diffuse disc bulge. Superimposed shallow right foraminal disc protrusion contacts the exiting right L2 nerve root. Mild facet ligament flavum hypertrophy. No significant spinal stenosis. Mild right neural foraminal narrowing. L3-4: Mild diffuse disc bulge with intervertebral disc space narrowing and disc desiccation. Mild to moderate facet ligament flavum hypertrophy. Short pedicles with resultant mild spinal stenosis. Mild bilateral L3 foraminal narrowing. L4-5: Mild diffuse disc bulge with intervertebral disc space narrowing. Chronic reactive endplate changes with marginal endplate spurring. Mild facet ligament flavum hypertrophy. Mild epidural lipomatosis. Short pedicles with resultant mild canal and bilateral L4 foraminal narrowing. L5-S1: Broad posterior disc bulge  with intervertebral disc space narrowing and disc desiccation. Chronic reactive endplate changes. Moderate right with mild left facet hypertrophy. Epidural lipomatosis. No significant stenosis. 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 Abnormal cord signal extending from C3 through C6 consistent with cord edema/contusion. Neuro surgical consultation recommended. MRI LUMBAR SPINE IMPRESSION 1. No acute abnormality within is the lumbar spine. 2. Shallow right foraminal disc protrusion at L2-3, contacting and potentially irritating the exiting right L2 nerve root. 3. 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 4. Prominent right-sided facet hypertrophy at L5-S1, which could contribute to lower back pain. Findings communicated to Dr. Erma Heritage at 6:47 p.m. on 03/22/2018. Electronically Signed   By: Rise Mu M.D.   On: 03/22/2018 18:51   Mr Lumbar Spine Wo Contrast  Result Date: 03/22/2018 CLINICAL DATA:  Initial evaluation for acute right upper and lower extremity weakness, fall. EXAM: MRI CERVICAL AND LUMBAR SPINE WITHOUT CONTRAST TECHNIQUE: Multiplanar and multiecho pulse sequences of the cervical spine, to include the craniocervical junction and cervicothoracic junction, and lumbar spine, were obtained without intravenous contrast. COMPARISON:  None. FINDINGS: MRI CERVICAL SPINE FINDINGS Alignment: Straightening with slight reversal of the normal cervical lordosis. No listhesis. Vertebrae: Vertebral body heights are maintained without evidence for acute or  chronic fracture. No definite fracture seen within the cervical spine status post recent trauma. Bone marrow signal intensity diffusely decreased on T1 weighted imaging, suspected to be related to body habitus. No discrete or worrisome osseous lesions. Mild reactive edema about the C3-4 interspace  favored to be degenerative in nature. Cord: Linear T1/T2 hypointensity within the left ventral epidural space extending from C3 through C5 favored to reflect ossification of the posterior longitudinal ligament. Resultant severe spinal stenosis at C3-4 through C5-6. Abnormal T2 signal within the cervical spinal cord extending from C3 through C6 consistent with cord contusion/edema. Changes more pronounced at the right aspect of the cord. Possible small focus of associated hemorrhage at the level of C5-6 (series 6, image 8). Posterior Fossa, vertebral arteries, paraspinal tissues: Visualized brain and posterior fossa within normal limits. Craniocervical junction normal. Paraspinous and prevertebral soft tissues within normal limits. No findings to suggest acute ligamentous injury. Normal intravascular flow voids present within the vertebral arteries bilaterally. Disc levels: Underlying congenital spinal stenosis. C2-C3: Left paracentral disc protrusion indents the ventral thecal sac, flattening the left hemi cord. No cord signal changes. Moderate left-sided spinal stenosis. Neural foramina remain patent. C3-C4: Left paracentral osseous spur/ossification of the posterior longitudinal ligament. Underlying mild diffuse disc bulge with bilateral uncovertebral hypertrophy. Resultant severe spinal stenosis with spinal cord compression, most notable on the left. Thecal sac measures 3 mm in AP diameter at its most narrow point. Abnormal cord edema. Mild right with moderate left C4 foraminal stenosis. C4-C5: Left paracentral osseous spurs/ossification of the posterior longitudinal ligament. Underlying mild diffuse disc bulge with uncovertebral hypertrophy. Severe spinal stenosis with compression of the cervical spinal cord. Abnormal cord signal intensity. Moderate left C5 foraminal stenosis. C5-C6: Diffuse disc bulge with bilateral uncovertebral hypertrophy. Bulging disc slightly asymmetric to the left. Resultant mild spinal  stenosis with bilateral foraminal narrowing. C6-C7: Mild disc bulge with uncovertebral hypertrophy. No significant stenosis. C7-T1:  Unremarkable. Visualized upper thoracic spine demonstrates no significant finding. MRI LUMBAR SPINE FINDINGS Segmentation: Normal segmentation. Lowest well-formed disc labeled the L5-S1 level. Alignment: Mild scoliosis. Vertebral bodies otherwise normally aligned with preservation of the normal lumbar lordosis. No listhesis. Vertebrae: Vertebral body heights maintained without evidence for acute or chronic fracture. Underlying bone marrow signal intensity diffusely decreased on T1 weighted imaging, suspected to be related to body habitus. No discrete or worrisome osseous lesions. Reactive endplate changes with associated Schmorl's nodes present about the L4-5 and L5-S1 interspaces. Conus medullaris and cauda equina: Conus extends to the L2 level. Conus and cauda equina appear normal. Paraspinal and other soft tissues: Paraspinous soft tissues demonstrate no acute finding. Visualized visceral structures grossly unremarkable. Disc levels: Mild diffuse congenital shortening of the pedicles. L1-2:  Unremarkable. L2-3: Mild diffuse disc bulge. Superimposed shallow right foraminal disc protrusion contacts the exiting right L2 nerve root. Mild facet ligament flavum hypertrophy. No significant spinal stenosis. Mild right neural foraminal narrowing. L3-4: Mild diffuse disc bulge with intervertebral disc space narrowing and disc desiccation. Mild to moderate facet ligament flavum hypertrophy. Short pedicles with resultant mild spinal stenosis. Mild bilateral L3 foraminal narrowing. L4-5: Mild diffuse disc bulge with intervertebral disc space narrowing. Chronic reactive endplate changes with marginal endplate spurring. Mild facet ligament flavum hypertrophy. Mild epidural lipomatosis. Short pedicles with resultant mild canal and bilateral L4 foraminal narrowing. L5-S1: Broad posterior disc bulge  with intervertebral disc space narrowing and disc desiccation. Chronic reactive endplate changes. Moderate right with mild left facet hypertrophy. Epidural lipomatosis. No significant stenosis. 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 Abnormal cord signal extending from C3 through C6 consistent with cord edema/contusion. Neuro surgical consultation recommended. MRI LUMBAR SPINE IMPRESSION 1. No acute abnormality within is the lumbar spine. 2. Shallow right foraminal disc protrusion at L2-3, contacting and potentially irritating the exiting right L2 nerve root. 3. 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 4. Prominent right-sided facet hypertrophy at L5-S1, which could contribute to lower back pain. Findings communicated to Dr. Erma Heritage at 6:47 p.m. on 03/22/2018. Electronically Signed   By: Rise Mu M.D.   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 He has been referred for IVC filter placement. EXAM: ULTRASOUND GUIDED ACCESS RIGHT COMMON FEMORAL VEIN CAVAGRAM IVC FILTER PLACEMENT MEDICATIONS: None. ANESTHESIA/SEDATION: None FLUOROSCOPY TIME:  Fluoroscopy Time: 0 minutes 46 seconds (153 mGy). COMPLICATIONS: None PROCEDURE: The procedure, risks, benefits, and alternatives were explained to the patient. Specific risks discussed include bleeding, infection, contrast reaction, renal failure, IVC filter fracture, migration, ileo caval thrombus (3% incidence), need for further procedure, need for further surgery, pulmonary embolism, cardiopulmonary collapse, death. Questions regarding the procedure were encouraged and answered. The patient understands and consents to the  procedure. Ultrasound survey was performed with images stored and sent to PACs. The right inguinal region was prepped with chlorhexidine in a sterile fashion, and a sterile drape was applied covering the operative field. A sterile gown and sterile gloves were used for the procedure. Local anesthesia was provided with 1% Lidocaine. A single wall needle was used access the right common femoral vein under ultrasound. With excellent venous blood flow returned, in 035 wire passed through the needle into the IVC. Small incision was made with an 11 blade scalpel. The needle was removed. The delivery sheath for a retrievable Bard Denali filter was passed over the Bentson wire into the IVC. The wire was removed and small contrast was used to confirm IVC location. IVC cavagram performed. Dilator was removed, and the IVC filter was then delivered, positioned below the lowest renal vein, approximately L2 level. Repeat cavagram performed, and the catheter was removed. Manual pressure was used for hemostasis. Patient tolerated the procedure well and remained hemodynamically stable throughout. No complications were encountered and no significant blood loss was encounter. IMPRESSION: Status post placement of IVC filter via right common femoral approach. Signed, Yvone Neu. Reyne Dumas, RPVI [facility] Radiology PLAN: This IVC filter is potentially retrievable. The patient will be assessed for filter retrieval by Interventional Radiology in approximately 8-12 weeks. Further recommendations regarding filter retrieval, continued surveillance or declaration of device permanence, will be made at that time. Electronically Signed   By: Gilmer Mor D.O.   On: 03/28/2018 17:42   Dg Chest Port 1 View  Result Date: 03/25/2018 CLINICAL DATA:  Short of breath with chest pain. EXAM: PORTABLE CHEST 1 VIEW COMPARISON:  None. FINDINGS: Support apparatus projects over the apices. Mildly degraded  exam due to AP portable technique and patient body habitus. Patient rotated to the right. Normal heart size for level of inspiration. No pleural effusion or pneumothorax. Clear lungs. IMPRESSION: No acute cardiopulmonary disease. Electronically Signed   By: Jeronimo Greaves M.D.   On: 03/25/2018 23:21   Dg C-arm 1-60 Min  Result Date: 03/24/2018 CLINICAL DATA:  Cervical fusion. EXAM: DG CERVICAL SPINE - 1 VIEW; DG C-ARM 61-120 MIN COMPARISON:  Cervical spine CT 03/22/2018. FINDINGS: 2 intraoperative spot fluoro films are submitted. Images show C4 corpectomy with cage placement. Anterior cervical plate extends from C3 to C5. Fine bone detail not well demonstrated due to fluoroscopy. IMPRESSION: Intraoperative assessment during C4 corpectomy and anterior fusion from C3-C5. Electronically Signed   By: Kennith CenterEric  Mansell M.D.   On: 03/24/2018 12:16    ASSESSMENT & PLAN:   52 year old male with  #1 bilateral lower extremity DVT and bilateral pulmonary embolism Likely triggered by more immobility and postoperative status. His morbid obesity is also had an additional risk factor. .Body mass index is 45.29 kg/m. Patient denies being a smoker.   #2 postoperative surgical site bleeding on IV heparin started about day 3 postoperatively. This can be related to local risks of bleeding with early therapeutic anticoagulation. The risk of bleeding in the setting is higher with the use of bolus IV heparin and typically we prefer not to bolus with IV heparin in the setting.  #3 bleeding on Coumadin in the setting of rapidly increasing INR levels. Patient was started on an aggressive dose of Coumadin based on his body weight and had a rapid bump in his PT/INR which tends to correlate with increased risk of bleeding. This could have been if the patient has vitamin K deficiency or liver disease or is just excessively sensitive to Coumadin based on liver enzyme.  Plan -we reviewed his upper ext US - No evidence of  deep vein or superficial vein thrombosis involving the right and left upper extremities. -given recurrent oozing from surgical site (not certain this represented new bleeding -- ? Drainage of previous collection in the neck) -neck would dry for >24h. hgb stable. Platelets and INR stable, fibrinogen recovered post-op -will conservatively start on lovenox 40mg  [place] daily from today -if no bleeding for 48 hours can change back to loveonx 60mg  q12h for 1 week  And if still no bleeding issues would transition to Eliquis 5mg  po BID (without loading dose) -IVC filter to remain in place for now. -leg swelling - multifactorial -- mobilize. DC IVF, low dose lasix, RX DVT, minimize salt intake. -hematology f/u in clinic on discharge with Dr Candise CheKale   I spent 25 minutes counseling the patient face to face. The total time spent in the appointment was 35 minutes and more than 50% was on counseling and direct patient cares.    Wyvonnia LoraGautam Kale MD MS AAHIVMS [facility] Hematology/Oncology Physician [facility]  (Office):       [phone] (Work cell):  [phone] (Fax):           [phone]  04/07/2018 5:27 PM

## 2018-04-08 LAB — PROTIME-INR
INR: 1.11
Prothrombin Time: 14.3 seconds (ref 11.4–15.2)

## 2018-04-08 NOTE — Progress Notes (Signed)
Patient tolerated sitting up in bedside chair most of the day with no complaints other than his legs hurting due to some slight swelling in his legs.  Pain relieved with meds and elevation of extremities. Patient vertebralis some sadness of current situation with exwife filing for separation papers with his son.  Support given to patient.

## 2018-04-08 NOTE — Progress Notes (Signed)
Subjective: Patient reports still having some lower extremity swelling. Lasix was started last night. Also got one dose of lovenox last night. Denies any oozing from site overnight. Was able to ambulate some in the hallway yesterday. Sitting up in bed in good spirits.   Objective: Vital signs in last 24 hours: Temp:  [97.5 F (36.4 C)-98.5 F (36.9 C)] 98.2 F (36.8 C) (07/27 0300) Pulse Rate:  [61-76] 67 (07/27 0300) Resp:  [13-17] 17 (07/27 0300) BP: (116-132)/(71-92) 116/71 (07/27 0300) SpO2:  [93 %-97 %] 97 % (07/27 0300)  Intake/Output from previous day: 07/26 0701 - 07/27 0700 In: -  Out: 1700 [Urine:1700] Intake/Output this shift: No intake/output data recorded.  Neurologic: Grossly normal  Lab Results: Lab Results  Component Value Date   WBC 14.4 (H) 04/07/2018   HGB 12.0 (L) 04/07/2018   HCT 37.9 (L) 04/07/2018   MCV 92.4 04/07/2018   PLT 143 (L) 04/07/2018   Lab Results  Component Value Date   INR 1.11 04/08/2018   BMET Lab Results  Component Value Date   NA 135 04/03/2018   K 5.0 04/03/2018   CL 103 04/03/2018   CO2 26 04/03/2018   GLUCOSE 130 (H) 04/03/2018   BUN 36 (H) 04/03/2018   CREATININE 1.39 (H) 04/03/2018   CALCIUM 8.3 (L) 04/03/2018    Studies/Results: No results found.  Assessment/Plan: Doing very well. Incision CDI. Awaiting CIR placement, likely Monday. Continue therapies and assessing site for any oozing. Please call if patient starts too ooze from incision.    LOS: 17 days    Tiana LoftKimberly Hannah [facility] 04/08/2018, 7:44 AM

## 2018-04-09 LAB — PROTIME-INR
INR: 1.15
Prothrombin Time: 14.6 seconds (ref 11.4–15.2)

## 2018-04-09 NOTE — Progress Notes (Signed)
Pt is incontinent of stool. Initial shift assessment patient was turned from skin assessment and began to defecate and the second time he asked Nurse to check him because he had been "passing gas".  Pt states that he cannot tell the difference between passing gas and having a bowel movement.   Patient refused his colace and doxycycline because the bowels are loose.    Patient has on a condom catheter and states that he can feel when he has to void.  Order for patient to ambulate ONLY with physical therapy.   Patient is able to bend his legs and help turn himself when he is in the bed.   Initially I thought that patient had a fair to poor appetite but patient has visitors that bring him his meals.

## 2018-04-09 NOTE — Progress Notes (Signed)
NEUROSURGERY PROGRESS NOTE  Doing very well. No drainage overnight after another dose of Lovenox.  Incision CDI  Temp:  [98.1 F (36.7 C)-98.3 F (36.8 C)] 98.2 F (36.8 C) (07/28 0300) Pulse Rate:  [61-70] 63 (07/27 2300) Resp:  [12-21] 17 (07/28 0400) BP: (115-156)/(68-90) 146/90 (07/28 0400) SpO2:  [97 %-99 %] 98 % (07/28 0400)  Plan: Waiting CIR placement. Continue therapies.   Sherryl MangesKimberly Hannah Meyran, NP 04/09/2018 8:00 AM

## 2018-04-10 DIAGNOSIS — L8990 Pressure ulcer of unspecified site, unspecified stage: Secondary | ICD-10-CM

## 2018-04-10 LAB — PROTIME-INR
INR: 1.07
Prothrombin Time: 13.8 seconds (ref 11.4–15.2)

## 2018-04-10 MED ORDER — OXYCODONE-ACETAMINOPHEN 10-325 MG PO TABS: 1.0000 | ORAL_TABLET | Freq: Four times a day (QID) | ORAL | 0 refills | Status: DC | PRN

## 2018-04-10 MED ORDER — ENOXAPARIN SODIUM 60 MG/0.6ML ~~LOC~~ SOLN
60.0000 mg | SUBCUTANEOUS | Status: DC
  Administered 2018-04-10: 60 mg via SUBCUTANEOUS
  Filled 2018-04-10: qty 0.6

## 2018-04-10 NOTE — Progress Notes (Signed)
Physical Therapy Treatment Patient Details Name: [name] [name] MRN: [id number] DOB: 18-May-1966 Today's Date: 04/10/2018    History of Present Illness Patient is a 52 y/o male with PMH of HTN, DDD lumbar spine and gout.  He was admitted after a fall at home with inability to get up.  Reports one other recent fall when his dog pulled him over.  States recent R hand numbness,tingling and imbalance.  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  Now s/p anterior postion of surgery complete; pan for posterio on 7/15; Recovery complicated by 2 syncopal episodes and found to have bilat DVT with PE.  With anticoagulation developed some oozing and hematoma return to OR 7/16 to drain hematoma.    PT Comments    Pt showing steady improvement.  No bleeding in the last few days.  Pt needing less assist for transitions to EOB or to standing.  Pt standing more upright and holding upright longer.  Gait stability is improving as is stamina.  Ready for CIR.    Follow Up Recommendations  CIR;Supervision/Assistance - 24 hour     Equipment Recommendations  Rolling walker with 5" wheels;3in1 (PT)    Recommendations for Other Services Rehab consult     Precautions / Restrictions Precautions Precautions: Fall Required Braces or Orthoses: Cervical Brace Cervical Brace: Hard collar;At all times Restrictions Other Position/Activity Restrictions: cervical - watch wound    Mobility  Bed Mobility Overal bed mobility: Needs Assistance Bed Mobility: Rolling;Sidelying to Sit Rolling: Min assist Sidelying to sit: Min assist       General bed mobility comments: pt struggled and needed stability assist; cues for technique  Transfers Overall transfer level: Needs assistance Equipment used: Rolling walker (2 wheeled) Transfers: Sit to/from Stand Sit to Stand: Min assist;+2 physical assistance Stand pivot transfers: Min assist;+2 safety/equipment       General transfer comment: cues for hand  placement  Ambulation/Gait Ambulation/Gait assistance: Min assist;+2 physical assistance Gait Distance (Feet): 130 Feet(45 feet back to room.) Assistive device: Rolling walker (2 wheeled) Gait Pattern/deviations: Step-through pattern Gait velocity: decreased Gait velocity interpretation: <1.8 ft/sec, indicate of risk for recurrent falls General Gait Details: heavy use of the RW.  Pt able to stand upright to cue today.  Unsteady at R knee.  Weak gastrocsoleus bil.  Pt needed a rest before continuing on to the room.   Stairs             Wheelchair Mobility    Modified Rankin (Stroke Patients Only)       Balance Overall balance assessment: Needs assistance Sitting-balance support: Feet supported Sitting balance-Leahy Scale: Good     Standing balance support: Bilateral upper extremity supported Standing balance-Leahy Scale: Poor Standing balance comment: can release the RW, but still needing minimal external support.                            Cognition Arousal/Alertness: Awake/alert Behavior During Therapy: WFL for tasks assessed/performed Overall Cognitive Status: Within Functional Limits for tasks assessed                                        Exercises      General Comments        Pertinent Vitals/Pain Pain Assessment: No/denies pain Faces Pain Scale: No hurt    Home Living  Prior Function            PT Goals (current goals can now be found in the care plan section) Acute Rehab PT Goals Patient Stated Goal: to return to independent PT Goal Formulation: With patient Time For Goal Achievement: 04/13/18 Potential to Achieve Goals: Good Progress towards PT goals: Progressing toward goals    Frequency    Min 4X/week      PT Plan Current plan remains appropriate    Co-evaluation PT/OT/SLP Co-Evaluation/Treatment: Yes Reason for Co-Treatment: For patient/therapist safety PT goals  addressed during session: Mobility/safety with mobility OT goals addressed during session: ADL's and self-care;Strengthening/ROM;Proper use of Adaptive equipment and DME      AM-PAC PT "6 Clicks" Daily Activity  Outcome Measure  Difficulty turning over in bed (including adjusting bedclothes, sheets and blankets)?: A Lot Difficulty moving from lying on back to sitting on the side of the bed? : Unable Difficulty sitting down on and standing up from a chair with arms (e.g., wheelchair, bedside commode, etc,.)?: Unable Help needed moving to and from a bed to chair (including a wheelchair)?: A Little Help needed walking in hospital room?: A Little Help needed climbing 3-5 steps with a railing? : A Lot 6 Click Score: 12    End of Session Equipment Utilized During Treatment: Cervical collar Activity Tolerance: Patient tolerated treatment well Patient left: in chair;with call bell/phone within reach Nurse Communication: Mobility status PT Visit Diagnosis: Other abnormalities of gait and mobility (R26.89);Muscle weakness (generalized) (M62.81);History of falling (Z91.81)     Time: 1610-9604 PT Time Calculation (min) (ACUTE ONLY): 36 min  Charges:  $Gait Training: 8-22 mins                     04/10/2018  [place] Bing, PT [phone]  (pager)   [name] [name] 04/10/2018, 1:19 PM

## 2018-04-10 NOTE — Plan of Care (Signed)
  Problem: Education: Goal: Knowledge of General Education information will improve 04/10/2018 1013 by Tracie HarrierGraves, Nazifa Trinka, RN Outcome: Progressing 04/10/2018 1011 by Tracie HarrierGraves, Latoi Giraldo, RN Outcome: Progressing   Problem: Health Behavior/Discharge Planning: Goal: Ability to manage health-related needs will improve 04/10/2018 1013 by Tracie HarrierGraves, Zeppelin Beckstrand, RN Outcome: Progressing 04/10/2018 1011 by Tracie HarrierGraves, Tekeshia Klahr, RN Outcome: Progressing   Problem: Clinical Measurements: Goal: Ability to maintain clinical measurements within normal limits will improve 04/10/2018 1013 by Tracie HarrierGraves, Hiya [place], RN Outcome: Progressing 04/10/2018 1011 by Tracie HarrierGraves, Sierra Bissonette, RN Outcome: Progressing   Problem: Clinical Measurements: Goal: Will remain free from infection 04/10/2018 1013 by Tracie HarrierGraves, Latrisha Coiro, RN Outcome: Progressing 04/10/2018 1011 by Tracie HarrierGraves, Ronie Barnhart, RN Outcome: Progressing   Problem: Clinical Measurements: Goal: Diagnostic test results will improve 04/10/2018 1013 by Tracie HarrierGraves, Liliya Fullenwider, RN Outcome: Progressing 04/10/2018 1011 by Tracie HarrierGraves, Feliz Herard, RN Outcome: Progressing   Problem: Clinical Measurements: Goal: Respiratory complications will improve 04/10/2018 1013 by Tracie HarrierGraves, Naven Giambalvo, RN Outcome: Progressing 04/10/2018 1011 by Tracie HarrierGraves, Samyah Bilbo, RN Outcome: Progressing

## 2018-04-10 NOTE — Discharge Summary (Signed)
Physician Discharge Summary  Patient ID: [name] [name] MRN: [id number] DOB/AGE: 15-Oct-1965 52 y.o.  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  Discharge Diagnoses: same   Discharged Condition: good  Hospital Course: The patient was admitted on 03/22/2018 and taken to the operating room where the patient underwent anterior cervical corpectomy C4. The patient tolerated the procedure well and was taken to the recovery room and then to the floor in stable condition. Patient developed multiple PEs and bilateral LE DVTs. Started on heparin and coumadin. He was then taken back to the OR on 7/16 after he developed a hematoma.The wound remained clean dry and intact. Pt had appropriate neck soreness. No complaints of arm pain or new N/T/W. The patient remained afebrile with stable vital signs, and tolerated a regular diet. The patient continued to increase activities, and pain was well controlled with oral pain medications.   Consults: None  Significant Diagnostic Studies:  Results for orders placed or performed during the hospital encounter of 03/22/18  MRSA PCR Screening  Result Value Ref Range   MRSA by PCR NEGATIVE NEGATIVE  CBC  Result Value Ref Range   WBC 6.4 4.0 - 10.5 K/uL   RBC 4.82 4.22 - 5.81 MIL/uL   Hemoglobin 13.8 13.0 - 17.0 g/dL   HCT 40.9 81.1 - 91.4 %   MCV 91.1 78.0 - 100.0 fL   MCH 28.6 26.0 - 34.0 pg   MCHC 31.4 30.0 - 36.0 g/dL   RDW 78.2 95.6 - 21.3 %   Platelets 173 150 - 400 K/uL  Basic metabolic panel  Result Value Ref Range   Sodium 141 135 - 145 mmol/L   Potassium 3.7 3.5 - 5.1 mmol/L   Chloride 106 98 - 111 mmol/L   CO2 24 22 - 32 mmol/L   Glucose, Bld 84 70 - 99 mg/dL   BUN 10 6 - 20 mg/dL   Creatinine, Ser 0.86 0.61 - 1.24 mg/dL   Calcium 9.2 8.9 - 57.8 mg/dL   GFR calc non Af Amer >60 >60 mL/min   GFR calc  Af Amer >60 >60 mL/min   Anion gap 11 5 - 15  Glucose, capillary  Result Value Ref Range   Glucose-Capillary 172 (H) 70 - 99 mg/dL  Basic metabolic panel  Result Value Ref Range   Sodium 139 135 - 145 mmol/L   Potassium 4.0 3.5 - 5.1 mmol/L   Chloride 107 98 - 111 mmol/L   CO2 24 22 - 32 mmol/L   Glucose, Bld 159 (H) 70 - 99 mg/dL   BUN 24 (H) 6 - 20 mg/dL   Creatinine, Ser 4.69 0.61 - 1.24 mg/dL   Calcium 8.8 (L) 8.9 - 10.3 mg/dL   GFR calc non Af Amer >60 >60 mL/min   GFR calc Af Amer >60 >60 mL/min   Anion gap 8 5 - 15  CBC with Differential/Platelet  Result Value Ref Range   WBC 9.1 4.0 - 10.5 K/uL   RBC 4.68 4.22 - 5.81 MIL/uL   Hemoglobin 13.7 13.0 - 17.0 g/dL   HCT 62.9 52.8 - 41.3 %   MCV 92.9 78.0 - 100.0 fL   MCH 29.3 26.0 - 34.0 pg   MCHC 31.5 30.0 - 36.0 g/dL   RDW 24.4 01.0 - 27.2 %   Platelets 122 (L) 150 - 400 K/uL   Neutrophils Relative % 85 %  Neutro Abs 7.8 (H) 1.7 - 7.7 K/uL   Lymphocytes Relative 11 %   Lymphs Abs 1.0 0.7 - 4.0 K/uL   Monocytes Relative 3 %   Monocytes Absolute 0.3 0.1 - 1.0 K/uL   Eosinophils Relative 0 %   Eosinophils Absolute 0.0 0.0 - 0.7 K/uL   Basophils Relative 0 %   Basophils Absolute 0.0 0.0 - 0.1 K/uL   Immature Granulocytes 1 %   Abs Immature Granulocytes 0.1 0.0 - 0.1 K/uL  Glucose, capillary  Result Value Ref Range   Glucose-Capillary 147 (H) 70 - 99 mg/dL  Heparin level (unfractionated)  Result Value Ref Range   Heparin Unfractionated 0.98 (H) 0.30 - 0.70 IU/mL  CBC  Result Value Ref Range   WBC 8.0 4.0 - 10.5 K/uL   RBC 4.91 4.22 - 5.81 MIL/uL   Hemoglobin 14.2 13.0 - 17.0 g/dL   HCT 16.1 09.6 - 04.5 %   MCV 89.4 78.0 - 100.0 fL   MCH 28.9 26.0 - 34.0 pg   MCHC 32.3 30.0 - 36.0 g/dL   RDW 40.9 81.1 - 91.4 %   Platelets 106 (L) 150 - 400 K/uL  Protime-INR  Result Value Ref Range   Prothrombin Time 16.3 (H) 11.4 - 15.2 seconds   INR 1.32   CBC  Result Value Ref Range   WBC 8.4 4.0 - 10.5 K/uL   RBC  4.53 4.22 - 5.81 MIL/uL   Hemoglobin 13.0 13.0 - 17.0 g/dL   HCT 78.2 95.6 - 21.3 %   MCV 89.4 78.0 - 100.0 fL   MCH 28.7 26.0 - 34.0 pg   MCHC 32.1 30.0 - 36.0 g/dL   RDW 08.6 57.8 - 46.9 %   Platelets 87 (L) 150 - 400 K/uL  Heparin induced platelet Ab (HIT antibody)  Result Value Ref Range   Heparin Induced Plt Ab 0.228 0.000 - 0.400 OD  CBC  Result Value Ref Range   WBC 10.5 4.0 - 10.5 K/uL   RBC 4.37 4.22 - 5.81 MIL/uL   Hemoglobin 12.8 (L) 13.0 - 17.0 g/dL   HCT 62.9 52.8 - 41.3 %   MCV 91.3 78.0 - 100.0 fL   MCH 29.3 26.0 - 34.0 pg   MCHC 32.1 30.0 - 36.0 g/dL   RDW 24.4 01.0 - 27.2 %   Platelets 80 (L) 150 - 400 K/uL  Protime-INR  Result Value Ref Range   Prothrombin Time 17.7 (H) 11.4 - 15.2 seconds   INR 1.47   CBC  Result Value Ref Range   WBC 12.0 (H) 4.0 - 10.5 K/uL   RBC 4.50 4.22 - 5.81 MIL/uL   Hemoglobin 13.1 13.0 - 17.0 g/dL   HCT 53.6 64.4 - 03.4 %   MCV 92.4 78.0 - 100.0 fL   MCH 29.1 26.0 - 34.0 pg   MCHC 31.5 30.0 - 36.0 g/dL   RDW 74.2 59.5 - 63.8 %   Platelets 78 (L) 150 - 400 K/uL  Protime-INR  Result Value Ref Range   Prothrombin Time 32.5 (H) 11.4 - 15.2 seconds   INR 3.20   CBC  Result Value Ref Range   WBC 13.4 (H) 4.0 - 10.5 K/uL   RBC 4.25 4.22 - 5.81 MIL/uL   Hemoglobin 12.6 (L) 13.0 - 17.0 g/dL   HCT 75.6 43.3 - 29.5 %   MCV 92.7 78.0 - 100.0 fL   MCH 29.6 26.0 - 34.0 pg   MCHC 32.0 30.0 - 36.0 g/dL   RDW  12.9 11.5 - 15.5 %   Platelets 83 (L) 150 - 400 K/uL  CBC  Result Value Ref Range   WBC 11.4 (H) 4.0 - 10.5 K/uL   RBC 4.11 (L) 4.22 - 5.81 MIL/uL   Hemoglobin 12.0 (L) 13.0 - 17.0 g/dL   HCT 16.1 (L) 09.6 - 04.5 %   MCV 91.7 78.0 - 100.0 fL   MCH 29.2 26.0 - 34.0 pg   MCHC 31.8 30.0 - 36.0 g/dL   RDW 40.9 81.1 - 91.4 %   Platelets 89 (L) 150 - 400 K/uL  Protime-INR  Result Value Ref Range   Prothrombin Time 46.2 (H) 11.4 - 15.2 seconds   INR 5.02 (HH)   Protime-INR  Result Value Ref Range   Prothrombin Time 42.6  (H) 11.4 - 15.2 seconds   INR 4.53 (HH)   Protime-INR  Result Value Ref Range   Prothrombin Time 30.6 (H) 11.4 - 15.2 seconds   INR 2.96   CBC with Differential/Platelet  Result Value Ref Range   WBC 12.2 (H) 4.0 - 10.5 K/uL   RBC 4.61 4.22 - 5.81 MIL/uL   Hemoglobin 13.4 13.0 - 17.0 g/dL   HCT 78.2 95.6 - 21.3 %   MCV 90.9 78.0 - 100.0 fL   MCH 29.1 26.0 - 34.0 pg   MCHC 32.0 30.0 - 36.0 g/dL   RDW 08.6 57.8 - 46.9 %   Platelets 116 (L) 150 - 400 K/uL   Neutrophils Relative % 84 %   Neutro Abs 10.2 (H) 1.7 - 7.7 K/uL   Lymphocytes Relative 8 %   Lymphs Abs 1.0 0.7 - 4.0 K/uL   Monocytes Relative 7 %   Monocytes Absolute 0.8 0.1 - 1.0 K/uL   Eosinophils Relative 0 %   Eosinophils Absolute 0.0 0.0 - 0.7 K/uL   Basophils Relative 0 %   Basophils Absolute 0.0 0.0 - 0.1 K/uL   Immature Granulocytes 1 %   Abs Immature Granulocytes 0.2 (H) 0.0 - 0.1 K/uL  Comprehensive metabolic panel  Result Value Ref Range   Sodium 135 135 - 145 mmol/L   Potassium 5.0 3.5 - 5.1 mmol/L   Chloride 103 98 - 111 mmol/L   CO2 26 22 - 32 mmol/L   Glucose, Bld 130 (H) 70 - 99 mg/dL   BUN 36 (H) 6 - 20 mg/dL   Creatinine, Ser 6.29 (H) 0.61 - 1.24 mg/dL   Calcium 8.3 (L) 8.9 - 10.3 mg/dL   Total Protein 5.9 (L) 6.5 - 8.1 g/dL   Albumin 2.9 (L) 3.5 - 5.0 g/dL   AST 37 15 - 41 U/L   ALT 65 (H) 0 - 44 U/L   Alkaline Phosphatase 43 38 - 126 U/L   Total Bilirubin 1.4 (H) 0.3 - 1.2 mg/dL   GFR calc non Af Amer 57 (L) >60 mL/min   GFR calc Af Amer >60 >60 mL/min   Anion gap 6 5 - 15  APTT  Result Value Ref Range   aPTT 35 24 - 36 seconds  Fibrinogen  Result Value Ref Range   Fibrinogen 154 (L) 210 - 475 mg/dL  Protime-INR  Result Value Ref Range   Prothrombin Time 23.2 (H) 11.4 - 15.2 seconds   INR 2.08   Protime-INR  Result Value Ref Range   Prothrombin Time 24.7 (H) 11.4 - 15.2 seconds   INR 2.25   Protime-INR  Result Value Ref Range   Prothrombin Time 18.8 (H) 11.4 - 15.2 seconds  INR  1.58   CBC with Differential/Platelet  Result Value Ref Range   WBC 12.9 (H) 4.0 - 10.5 K/uL   RBC 4.38 4.22 - 5.81 MIL/uL   Hemoglobin 13.0 13.0 - 17.0 g/dL   HCT 40.9 81.1 - 91.4 %   MCV 91.1 78.0 - 100.0 fL   MCH 29.7 26.0 - 34.0 pg   MCHC 32.6 30.0 - 36.0 g/dL   RDW 78.2 95.6 - 21.3 %   Platelets 154 150 - 400 K/uL   Neutrophils Relative % 81 %   Neutro Abs 10.5 (H) 1.7 - 7.7 K/uL   Lymphocytes Relative 11 %   Lymphs Abs 1.4 0.7 - 4.0 K/uL   Monocytes Relative 7 %   Monocytes Absolute 0.9 0.1 - 1.0 K/uL   Eosinophils Relative 0 %   Eosinophils Absolute 0.0 0.0 - 0.7 K/uL   Basophils Relative 0 %   Basophils Absolute 0.0 0.0 - 0.1 K/uL   Immature Granulocytes 1 %   Abs Immature Granulocytes 0.1 0.0 - 0.1 K/uL  Protime-INR  Result Value Ref Range   Prothrombin Time 14.9 11.4 - 15.2 seconds   INR 1.18   Protime-INR  Result Value Ref Range   Prothrombin Time 13.8 11.4 - 15.2 seconds   INR 1.07   CBC  Result Value Ref Range   WBC 14.4 (H) 4.0 - 10.5 K/uL   RBC 4.10 (L) 4.22 - 5.81 MIL/uL   Hemoglobin 12.0 (L) 13.0 - 17.0 g/dL   HCT 08.6 (L) 57.8 - 46.9 %   MCV 92.4 78.0 - 100.0 fL   MCH 29.3 26.0 - 34.0 pg   MCHC 31.7 30.0 - 36.0 g/dL   RDW 62.9 52.8 - 41.3 %   Platelets 143 (L) 150 - 400 K/uL  Fibrinogen  Result Value Ref Range   Fibrinogen 376 210 - 475 mg/dL  Protime-INR  Result Value Ref Range   Prothrombin Time 14.3 11.4 - 15.2 seconds   INR 1.11   Protime-INR  Result Value Ref Range   Prothrombin Time 14.6 11.4 - 15.2 seconds   INR 1.15   Protime-INR  Result Value Ref Range   Prothrombin Time 13.8 11.4 - 15.2 seconds   INR 1.07   ECHOCARDIOGRAM COMPLETE  Result Value Ref Range   Weight 4,906.56 oz   Height 69 in   BP 165/88 mmHg  Prepare Pheresed Platelets  Result Value Ref Range   Unit Number [id number]    Blood Component Type PLTPH LI2 PAS    Unit division 00    Status of Unit REL FROM [facility]    Transfusion Status OK TO TRANSFUSE    Unit  Number [id number]    Blood Component Type PLTP LR2 PAS    Unit division 00    Status of Unit ISSUED,FINAL    Transfusion Status      OK TO TRANSFUSE Performed at [facility], [address], [place], [place] [address]   ABO/Rh  Result Value Ref Range   ABO/RH(D)      O POS Performed at [facility], [address], [place], [place] [address]   [facility] Platelet Pheresis  Result Value Ref Range   ISSUE DATE / TIME [id number]    Blood Product Unit Number [id number]    PRODUCT CODE E7007V00    Unit Type and Rh 6200    Blood Product Expiration Date [id number]    ISSUE DATE / TIME [id number]    Blood Product Unit Number  [id number]    PRODUCT CODE [id number]    Unit Type and Rh 5100    Blood Product Expiration Date [id number]     Dg Cervical Spine 1 View  Result Date: 03/24/2018 CLINICAL DATA:  Cervical fusion. EXAM: DG CERVICAL SPINE - 1 VIEW; DG C-ARM 61-120 MIN COMPARISON:  Cervical spine CT 03/22/2018. FINDINGS: 2 intraoperative spot fluoro films are submitted. Images show C4 corpectomy with cage placement. Anterior cervical plate extends from C3 to C5. Fine bone detail not well demonstrated due to fluoroscopy. IMPRESSION: Intraoperative assessment during C4 corpectomy and anterior fusion from C3-C5. Electronically Signed   By: Kennith Center M.D.   On: 03/24/2018 12:16   Ct Head Wo Contrast  Result Date: 03/22/2018 CLINICAL DATA:  Right-sided numbness and tingling, recent fall EXAM: CT HEAD WITHOUT CONTRAST TECHNIQUE: Contiguous axial images were obtained from the base of the skull through the vertex without intravenous contrast. COMPARISON:  None. FINDINGS: Brain: The ventricular system is prominent, disproportionate to the level of cortical atrophy. Therefore, normal pressure hydrocephalus would be a consideration if appropriate clinically. The fourth ventricle and basilar cisterns are unremarkable. No hemorrhage, mass lesion, or acute infarction is seen.  Vascular: No vascular abnormality is noted on this unenhanced study. Skull: On bone window images, no calvarial abnormality is seen. Sinuses/Orbits: There is retention cyst and possibly small amount of fluid in the right maxillary sinus consistent with right maxillary sinus disease. Mild mucosal thickening is present medially in the left maxillary sinus. The remainder of paranasal sinuses are well pneumatized. Other: None. IMPRESSION: 1. Ventriculomegaly, disproportionate to the degree of atrophy. Consider the possibility of normal pressure hydrocephalus. 2. No acute intracranial abnormality. 3. Right maxillary sinus disease. Electronically Signed   By: Dwyane Dee M.D.   On: 03/22/2018 14:34   Ct Angio Chest Pe W Or Wo Contrast  Result Date: 03/27/2018 CLINICAL DATA:  Chest pain, recent surgery, DVT. EXAM: CT ANGIOGRAPHY CHEST WITH CONTRAST TECHNIQUE: Multidetector CT imaging of the chest was performed using the standard protocol during bolus administration of intravenous contrast. Multiplanar CT image reconstructions and MIPs were obtained to evaluate the vascular anatomy. CONTRAST:  ISOVUE-370 IOPAMIDOL (ISOVUE-370) INJECTION 76% COMPARISON:  Chest radiograph March 25, 2018. FINDINGS: CARDIOVASCULAR: Adequate contrast opacification of the pulmonary artery's. Main pulmonary artery is not enlarged. RIGHT upper lobar central pulmonary embolism, occlusive within segmental branches. Occlusive RIGHT middle lobe subsegmental pulmonary emboli. Occlusive segmental and subsegmental RIGHT lower lobe pulmonary emboli. Central nonocclusive filling defect LEFT main pulmonary artery casting into the lobar and segmental branches. Heart size is mildly enlarged, no right heart strain (RV/LV .8). No pericardial effusion. Thoracic aorta is normal course, 4 cm ascending aorta. Mild pulmonary venous congestion. MEDIASTINUM/NODES: No lymphadenopathy by CT size criteria. LUNGS/PLEURA: Tracheobronchial tree is patent, no  pneumothorax. No pleural effusions, focal consolidations, pulmonary nodules or masses. Bilateral lower lobe atelectasis. Punctate LEFT upper lobe subpleural granuloma. UPPER ABDOMEN: Included view of the abdomen is unremarkable. MUSCULOSKELETAL: Nonacute. Moderate mid and lower thoracic ventral endplate spurring. Review of the MIP images confirms the above findings. IMPRESSION: 1. Bilateral acute pulmonary emboli, occlusive within multiple segmental and subsegmental branches. No RIGHT heart strain. 2. Mild cardiomegaly and pulmonary venous congestion. 3. **An incidental finding of potential clinical significance has been found. 4 cm aneurysmal ascending aorta. Recommend annual imaging followup by CTA or MRA. This recommendation follows 2010 ACCF/AHA/AATS/ACR/ASA/SCA/SCAI/SIR/STS/SVM Guidelines for the Diagnosis and Management of Patients with Thoracic Aortic Disease. Circulation. 2010; 121: Z308-M578** 4. Critical Value/emergent results were  discussed by Dr. Oleta Mouse, radiology at the time of interpretation on 03/27/2018 at 5:00 pm to Dr. Donalee Citrin , who verbally acknowledged these results. Electronically Signed   By: Awilda Metro M.D.   On: 03/27/2018 17:04   Ct Cervical Spine Wo Contrast  Result Date: 03/22/2018 CLINICAL DATA:  Right-sided numbness and tingling since 2016. Patient fell on Monday. Myelopathy. EXAM: CT CERVICAL SPINE WITHOUT CONTRAST TECHNIQUE: Multidetector CT imaging of the cervical spine was performed without intravenous contrast. Multiplanar CT image reconstructions were also generated. COMPARISON:  Same day MRI FINDINGS: Alignment: Slight reversal cervical lordosis. Intact craniocervical relationship and atlantodental interval. No listhesis. Skull base and vertebrae: The vertebral body heights are maintained without evidence of acute fracture. No suspicious osseous lesions. Soft tissues and spinal canal: C3 through C6 ossification of the posterior longitudinal ligament most prominent  posterior to C3 and C4 markedly the narrowing the lumen of the central canal. This is most evident posterior to C3-4 where there is left central ossification occupying up to 75% of the AP dimension on the left. Disc levels: C2-C3: Left paracentral disc protrusion impressing upon the ventral aspect of the thecal sac. Moderate left-sided spinal stenosis. Patent neural foramina. C3-C4: Moderate disc flattening with left paracentral ossification of the posterior longitudinal ligament impressing upon the thecal sac and cord on the left up to 50% canal stenosis due to the ossified longitudinal ligament on the left. Bilateral uncovertebral joint hypertrophy with uncinate spurring. Mild right and moderate left foraminal stenosis. C4-C5: Prominent left broad based central ossified posterior longitudinal ligament with underlying diffuse disc bulge and uncovertebral joint osteoarthritis. Severe spinal stenosis up to 50% narrowing the AP dimension. Moderate left L5 foraminal stenosis. C5-C6: Central to left central disc herniation with partially calcified disc impressing upon the thecal sac. Resultant mild spinal stenosis. Slight bilateral foraminal encroachment from uncinate spurring. C6-C7: Negative without significant disc herniation identified. Shoulder artifacts limit assessment. C7-T1: Negative given limitations of streak artifacts from the patient's shoulders. Upper chest: Negative. 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 No acute cervical spine fracture. Electronically Signed   By: Tollie Eth M.D.   On: 03/22/2018 21:41   Mr Cervical Spine Wo Contrast  Result Date: 03/22/2018 CLINICAL DATA:  Initial evaluation for acute right upper and lower extremity weakness, fall. EXAM: MRI CERVICAL AND LUMBAR SPINE WITHOUT CONTRAST TECHNIQUE: Multiplanar and multiecho pulse sequences of the cervical spine, to include the  craniocervical junction and cervicothoracic junction, and lumbar spine, were obtained without intravenous contrast. COMPARISON:  None. FINDINGS: MRI CERVICAL SPINE FINDINGS Alignment: Straightening with slight reversal of the normal cervical lordosis. No listhesis. Vertebrae: Vertebral body heights are maintained without evidence for acute or chronic fracture. No definite fracture seen within the cervical spine status post recent trauma. Bone marrow signal intensity diffusely decreased on T1 weighted imaging, suspected to be related to body habitus. No discrete or worrisome osseous lesions. Mild reactive edema about the C3-4 interspace favored to be degenerative in nature. Cord: Linear T1/T2 hypointensity within the left ventral epidural space extending from C3 through C5 favored to reflect ossification of the posterior longitudinal ligament. Resultant severe spinal stenosis at C3-4 through C5-6. Abnormal T2 signal within the cervical spinal cord extending from C3 through C6 consistent with cord contusion/edema. Changes more pronounced at the right aspect of the cord. Possible small focus of associated hemorrhage at the level of C5-6 (series 6, image 8). Posterior Fossa, vertebral arteries, paraspinal  tissues: Visualized brain and posterior fossa within normal limits. Craniocervical junction normal. Paraspinous and prevertebral soft tissues within normal limits. No findings to suggest acute ligamentous injury. Normal intravascular flow voids present within the vertebral arteries bilaterally. Disc levels: Underlying congenital spinal stenosis. C2-C3: Left paracentral disc protrusion indents the ventral thecal sac, flattening the left hemi cord. No cord signal changes. Moderate left-sided spinal stenosis. Neural foramina remain patent. C3-C4: Left paracentral osseous spur/ossification of the posterior longitudinal ligament. Underlying mild diffuse disc bulge with bilateral uncovertebral hypertrophy. Resultant severe  spinal stenosis with spinal cord compression, most notable on the left. Thecal sac measures 3 mm in AP diameter at its most narrow point. Abnormal cord edema. Mild right with moderate left C4 foraminal stenosis. C4-C5: Left paracentral osseous spurs/ossification of the posterior longitudinal ligament. Underlying mild diffuse disc bulge with uncovertebral hypertrophy. Severe spinal stenosis with compression of the cervical spinal cord. Abnormal cord signal intensity. Moderate left C5 foraminal stenosis. C5-C6: Diffuse disc bulge with bilateral uncovertebral hypertrophy. Bulging disc slightly asymmetric to the left. Resultant mild spinal stenosis with bilateral foraminal narrowing. C6-C7: Mild disc bulge with uncovertebral hypertrophy. No significant stenosis. C7-T1:  Unremarkable. Visualized upper thoracic spine demonstrates no significant finding. MRI LUMBAR SPINE FINDINGS Segmentation: Normal segmentation. Lowest well-formed disc labeled the L5-S1 level. Alignment: Mild scoliosis. Vertebral bodies otherwise normally aligned with preservation of the normal lumbar lordosis. No listhesis. Vertebrae: Vertebral body heights maintained without evidence for acute or chronic fracture. Underlying bone marrow signal intensity diffusely decreased on T1 weighted imaging, suspected to be related to body habitus. No discrete or worrisome osseous lesions. Reactive endplate changes with associated Schmorl's nodes present about the L4-5 and L5-S1 interspaces. Conus medullaris and cauda equina: Conus extends to the L2 level. Conus and cauda equina appear normal. Paraspinal and other soft tissues: Paraspinous soft tissues demonstrate no acute finding. Visualized visceral structures grossly unremarkable. Disc levels: Mild diffuse congenital shortening of the pedicles. L1-2:  Unremarkable. L2-3: Mild diffuse disc bulge. Superimposed shallow right foraminal disc protrusion contacts the exiting right L2 nerve root. Mild facet ligament  flavum hypertrophy. No significant spinal stenosis. Mild right neural foraminal narrowing. L3-4: Mild diffuse disc bulge with intervertebral disc space narrowing and disc desiccation. Mild to moderate facet ligament flavum hypertrophy. Short pedicles with resultant mild spinal stenosis. Mild bilateral L3 foraminal narrowing. L4-5: Mild diffuse disc bulge with intervertebral disc space narrowing. Chronic reactive endplate changes with marginal endplate spurring. Mild facet ligament flavum hypertrophy. Mild epidural lipomatosis. Short pedicles with resultant mild canal and bilateral L4 foraminal narrowing. L5-S1: Broad posterior disc bulge with intervertebral disc space narrowing and disc desiccation. Chronic reactive endplate changes. Moderate right with mild left facet hypertrophy. Epidural lipomatosis. No significant stenosis. 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 Abnormal cord signal extending from C3 through C6 consistent with cord edema/contusion. Neuro surgical consultation recommended. MRI LUMBAR SPINE IMPRESSION 1. No acute abnormality within is the lumbar spine. 2. Shallow right foraminal disc protrusion at L2-3, contacting and potentially irritating the exiting right L2 nerve root. 3. 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 4. Prominent right-sided facet hypertrophy at L5-S1, which could contribute to lower back pain. Findings communicated to Dr. Erma Heritage at 6:47 p.m. on 03/22/2018. Electronically Signed   By: Rise Mu M.D.   On: 03/22/2018 18:51   Mr Lumbar Spine Wo Contrast  Result Date: 03/22/2018 CLINICAL DATA:  Initial  evaluation for acute right upper and lower extremity weakness, fall. EXAM: MRI CERVICAL AND LUMBAR SPINE WITHOUT CONTRAST TECHNIQUE: Multiplanar and multiecho pulse sequences of the cervical spine, to include the  craniocervical junction and cervicothoracic junction, and lumbar spine, were obtained without intravenous contrast. COMPARISON:  None. FINDINGS: MRI CERVICAL SPINE FINDINGS Alignment: Straightening with slight reversal of the normal cervical lordosis. No listhesis. Vertebrae: Vertebral body heights are maintained without evidence for acute or chronic fracture. No definite fracture seen within the cervical spine status post recent trauma. Bone marrow signal intensity diffusely decreased on T1 weighted imaging, suspected to be related to body habitus. No discrete or worrisome osseous lesions. Mild reactive edema about the C3-4 interspace favored to be degenerative in nature. Cord: Linear T1/T2 hypointensity within the left ventral epidural space extending from C3 through C5 favored to reflect ossification of the posterior longitudinal ligament. Resultant severe spinal stenosis at C3-4 through C5-6. Abnormal T2 signal within the cervical spinal cord extending from C3 through C6 consistent with cord contusion/edema. Changes more pronounced at the right aspect of the cord. Possible small focus of associated hemorrhage at the level of C5-6 (series 6, image 8). Posterior Fossa, vertebral arteries, paraspinal tissues: Visualized brain and posterior fossa within normal limits. Craniocervical junction normal. Paraspinous and prevertebral soft tissues within normal limits. No findings to suggest acute ligamentous injury. Normal intravascular flow voids present within the vertebral arteries bilaterally. Disc levels: Underlying congenital spinal stenosis. C2-C3: Left paracentral disc protrusion indents the ventral thecal sac, flattening the left hemi cord. No cord signal changes. Moderate left-sided spinal stenosis. Neural foramina remain patent. C3-C4: Left paracentral osseous spur/ossification of the posterior longitudinal ligament. Underlying mild diffuse disc bulge with bilateral uncovertebral hypertrophy. Resultant severe  spinal stenosis with spinal cord compression, most notable on the left. Thecal sac measures 3 mm in AP diameter at its most narrow point. Abnormal cord edema. Mild right with moderate left C4 foraminal stenosis. C4-C5: Left paracentral osseous spurs/ossification of the posterior longitudinal ligament. Underlying mild diffuse disc bulge with uncovertebral hypertrophy. Severe spinal stenosis with compression of the cervical spinal cord. Abnormal cord signal intensity. Moderate left C5 foraminal stenosis. C5-C6: Diffuse disc bulge with bilateral uncovertebral hypertrophy. Bulging disc slightly asymmetric to the left. Resultant mild spinal stenosis with bilateral foraminal narrowing. C6-C7: Mild disc bulge with uncovertebral hypertrophy. No significant stenosis. C7-T1:  Unremarkable. Visualized upper thoracic spine demonstrates no significant finding. MRI LUMBAR SPINE FINDINGS Segmentation: Normal segmentation. Lowest well-formed disc labeled the L5-S1 level. Alignment: Mild scoliosis. Vertebral bodies otherwise normally aligned with preservation of the normal lumbar lordosis. No listhesis. Vertebrae: Vertebral body heights maintained without evidence for acute or chronic fracture. Underlying bone marrow signal intensity diffusely decreased on T1 weighted imaging, suspected to be related to body habitus. No discrete or worrisome osseous lesions. Reactive endplate changes with associated Schmorl's nodes present about the L4-5 and L5-S1 interspaces. Conus medullaris and cauda equina: Conus extends to the L2 level. Conus and cauda equina appear normal. Paraspinal and other soft tissues: Paraspinous soft tissues demonstrate no acute finding. Visualized visceral structures grossly unremarkable. Disc levels: Mild diffuse congenital shortening of the pedicles. L1-2:  Unremarkable. L2-3: Mild diffuse disc bulge. Superimposed shallow right foraminal disc protrusion contacts the exiting right L2 nerve root. Mild facet ligament  flavum hypertrophy. No significant spinal stenosis. Mild right neural foraminal narrowing. L3-4: Mild diffuse disc bulge with intervertebral disc space narrowing and disc desiccation. Mild to moderate facet ligament flavum hypertrophy. Short pedicles with resultant mild spinal stenosis.  Mild bilateral L3 foraminal narrowing. L4-5: Mild diffuse disc bulge with intervertebral disc space narrowing. Chronic reactive endplate changes with marginal endplate spurring. Mild facet ligament flavum hypertrophy. Mild epidural lipomatosis. Short pedicles with resultant mild canal and bilateral L4 foraminal narrowing. L5-S1: Broad posterior disc bulge with intervertebral disc space narrowing and disc desiccation. Chronic reactive endplate changes. Moderate right with mild left facet hypertrophy. Epidural lipomatosis. No significant stenosis. 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 Abnormal cord signal extending from C3 through C6 consistent with cord edema/contusion. Neuro surgical consultation recommended. MRI LUMBAR SPINE IMPRESSION 1. No acute abnormality within is the lumbar spine. 2. Shallow right foraminal disc protrusion at L2-3, contacting and potentially irritating the exiting right L2 nerve root. 3. 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 4. Prominent right-sided facet hypertrophy at L5-S1, which could contribute to lower back pain. Findings communicated to Dr. Erma Heritage at 6:47 p.m. on 03/22/2018. Electronically Signed   By: Rise Mu M.D.   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 He has been referred for IVC filter placement. EXAM: ULTRASOUND GUIDED ACCESS RIGHT COMMON  FEMORAL VEIN CAVAGRAM IVC FILTER PLACEMENT MEDICATIONS: None. ANESTHESIA/SEDATION: None FLUOROSCOPY TIME:  Fluoroscopy Time: 0 minutes 46 seconds (153 mGy). COMPLICATIONS: None PROCEDURE: The procedure, risks, benefits, and alternatives were explained to the patient. Specific risks discussed include bleeding, infection, contrast reaction, renal failure, IVC filter fracture, migration, ileo caval thrombus (3% incidence), need for further procedure, need for further surgery, pulmonary embolism, cardiopulmonary collapse, death. Questions regarding the procedure were encouraged and answered. The patient understands and consents to the procedure. Ultrasound survey was performed with images stored and sent to PACs. The right inguinal region was prepped with chlorhexidine in a sterile fashion, and a sterile drape was applied covering the operative field. A sterile gown and sterile gloves were used for the procedure. Local anesthesia was provided with 1% Lidocaine. A single wall needle was used access the right common femoral vein under ultrasound. With excellent venous blood flow returned, in 035 wire passed through the needle into the IVC. Small incision was made with an 11 blade scalpel. The needle was removed. The delivery sheath for a retrievable Bard Denali filter was passed over the Bentson wire into the IVC. The wire was removed and small contrast was used to confirm IVC location. IVC cavagram performed. Dilator was removed, and the IVC filter was then delivered, positioned below the lowest renal vein, approximately L2 level. Repeat cavagram performed, and the catheter was removed. Manual pressure was used for hemostasis. Patient tolerated the procedure well and remained hemodynamically stable throughout. No complications were encountered and no significant blood loss was encounter. IMPRESSION: Status post placement of IVC filter via right common femoral approach. Signed, Yvone Neu. Reyne Dumas, RPVI [facility]  [facility] Radiology PLAN: This IVC filter is potentially retrievable. The patient will be assessed for filter retrieval by Interventional Radiology in approximately 8-12 weeks. Further recommendations regarding filter retrieval, continued surveillance or declaration of device permanence, will be made at that time. Electronically Signed   By: Gilmer Mor D.O.   On: 03/28/2018 17:42   Dg Chest Port 1 View  Result Date: 03/25/2018 CLINICAL DATA:  Short of breath  with chest pain. EXAM: PORTABLE CHEST 1 VIEW COMPARISON:  None. FINDINGS: Support apparatus projects over the apices. Mildly degraded exam due to AP portable technique and patient body habitus. Patient rotated to the right. Normal heart size for level of inspiration. No pleural effusion or pneumothorax. Clear lungs. IMPRESSION: No acute cardiopulmonary disease. Electronically Signed   By: Jeronimo GreavesKyle  Talbot M.D.   On: 03/25/2018 23:21   Dg C-arm 1-60 Min  Result Date: 03/24/2018 CLINICAL DATA:  Cervical fusion. EXAM: DG CERVICAL SPINE - 1 VIEW; DG C-ARM 61-120 MIN COMPARISON:  Cervical spine CT 03/22/2018. FINDINGS: 2 intraoperative spot fluoro films are submitted. Images show C4 corpectomy with cage placement. Anterior cervical plate extends from C3 to C5. Fine bone detail not well demonstrated due to fluoroscopy. IMPRESSION: Intraoperative assessment during C4 corpectomy and anterior fusion from C3-C5. Electronically Signed   By: Kennith CenterEric  Mansell M.D.   On: 03/24/2018 12:16    Antibiotics:  Anti-infectives (From admission, onward)   Start     Dose/Rate Route Frequency Ordered Stop   04/05/18 1415  doxycycline (VIBRA-TABS) tablet 100 mg  Status:  Discontinued     100 mg Oral Every 12 hours 04/05/18 1412 04/10/18 1314   03/28/18 1830  ceFAZolin (ANCEF) IVPB 2g/100 mL premix     2 g 200 mL/hr over 30 Minutes Intravenous Every 8 hours 03/28/18 1233 03/29/18 0338   03/28/18 1000  bacitracin 50,000 Units in sodium  chloride 0.9 % 500 mL irrigation  Status:  Discontinued       As needed 03/28/18 1033 03/28/18 1118   03/24/18 1945  ceFAZolin (ANCEF) IVPB 2g/100 mL premix     2 g 200 mL/hr over 30 Minutes Intravenous Every 8 hours 03/24/18 1342 03/26/18 1209   03/24/18 0831  bacitracin 50,000 Units in sodium chloride 0.9 % 500 mL irrigation  Status:  Discontinued       As needed 03/24/18 0831 03/24/18 1154   03/24/18 0600  ceFAZolin (ANCEF) IVPB 2g/100 mL premix     2 g 200 mL/hr over 30 Minutes Intravenous On call to O.R. 03/23/18 1922 03/24/18 1144      Discharge Exam: Blood pressure 102/66, pulse 66, temperature 98.3 F (36.8 C), resp. rate 15, height 5\' 9"  (1.753 m), weight (!) 139.1 kg (306 lb 10.6 oz), SpO2 99 %. Neurologic: Grossly normal Ambulating well with PT and voiding  Discharge Medications:   Allergies as of 04/10/2018      Reactions   Nsaids Other (See Comments)   G6PD   Sulfa Antibiotics Other (See Comments)   G6PD      Medication List    TAKE these medications   naproxen sodium 220 MG tablet Commonly known as:  ALEVE Take 220 mg by mouth 2 (two) times daily as needed (pain).   oxyCODONE-acetaminophen 10-325 MG tablet Commonly known as:  PERCOCET Take 1 tablet by mouth every 6 (six) hours as needed for pain.       Disposition: CIR   Final Dx: corpectomy C4  Discharge Instructions    Call MD for:  difficulty breathing, headache or visual disturbances   Complete by:  As directed    Call MD for:  hives   Complete by:  As directed    Call MD for:  persistant dizziness or light-headedness   Complete by:  As directed    Call MD for:  persistant nausea and vomiting   Complete by:  As directed    Call MD for:  redness, tenderness, or  signs of infection (pain, swelling, redness, odor or green/yellow discharge around incision site)   Complete by:  As directed    Diet - low sodium heart healthy   Complete by:  As directed    Increase activity slowly   Complete by:  As  directed       Follow-up Information    Donalee Citrin, MD. Schedule an appointment as soon as possible for a visit in 2 week(s).   Specialty:  Neurosurgery Contact information: [address] [place] [address] [phone]            Signed: Tiana Loft [facility] 04/10/2018, 1:37 PM

## 2018-04-10 NOTE — Progress Notes (Signed)
Inpatient Rehabilitation-Admissions Coordinator   [facility] as well as medical approval.   However, AC will hold admission to CIR (Per Dr. Maryla MorrowAnkit Patel) until anticoagulation dosing changes complete and pt is without bleeding issues for >24 hours after change.   Pt and neurosurgery service have been notified of recommendation to hold admit.  Please call if questions.   Nanine MeansKelly Sande Pickert, OTR/L  Rehab Admissions Coordinator  [phone] 04/10/2018 2:26 PM

## 2018-04-10 NOTE — Plan of Care (Signed)
  Problem: Education: Goal: Knowledge of General Education information will improve Outcome: Progressing   Problem: Health Behavior/Discharge Planning: Goal: Ability to manage health-related needs will improve Outcome: Progressing   Problem: Clinical Measurements: Goal: Ability to maintain clinical measurements within normal limits will improve Outcome: Progressing   Problem: Clinical Measurements: Goal: Will remain free from infection Outcome: Progressing   Problem: Clinical Measurements: Goal: Diagnostic test results will improve Outcome: Progressing   Problem: Clinical Measurements: Goal: Respiratory complications will improve Outcome: Progressing   Problem: Clinical Measurements: Goal: Cardiovascular complication will be avoided Outcome: Progressing   Problem: Coping: Goal: Level of anxiety will decrease Outcome: Progressing   Problem: Nutrition: Goal: Adequate nutrition will be maintained Outcome: Progressing   Problem: Safety: Goal: Ability to remain free from injury will improve Outcome: Progressing   Problem: Skin Integrity: Goal: Risk for impaired skin integrity will decrease Outcome: Progressing

## 2018-04-10 NOTE — Progress Notes (Signed)
OT progress Note:  Clinical Impression:Pt progressing towards OT goals this session. Pt able to complete toilet transfer with min A +2 assist for initial boost, and use of RW. Pt max A in standing for rear peri care. Pt continues to demonstrate decreased activity tolerance, R hand deficits, increased need for assist with transfers - and yet the best attitude, motivation to improve and work ethic. CIR remains essential for return to PLOF. Pt will benefit from wide equipment and geomat for recliner. Next OT session to start focus on LB compensatory strategies for ADL.     04/10/18 1100  OT Visit Information  Last OT Received On 04/10/18  Assistance Needed +2  PT/OT/SLP Co-Evaluation/Treatment Yes  Reason for Co-Treatment For patient/therapist safety;To address functional/ADL transfers  PT goals addressed during session Mobility/safety with mobility;Balance;Proper use of DME  OT goals addressed during session ADL's and self-care;Strengthening/ROM;Proper use of Adaptive equipment and DME  History of Present Illness Patient is a 52 y/o male with PMH of HTN, DDD lumbar spine and gout.  He was admitted after a fall at home with inability to get up.  Reports one other recent fall when his dog pulled him over.  States recent R hand numbness,tingling and imbalance.  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  Now s/p anterior postion of surgery complete; pan for posterio on 7/15; Recovery complicated by 2 syncopal episodes and found to have bilat DVT with PE.  With anticoagulation developed some oozing and hematoma return to OR 7/16 to drain hematoma.  Precautions  Precautions Fall  Required Braces or Orthoses Cervical Brace  Cervical Brace Hard collar;At all times  Pain Assessment  Pain Assessment No/denies pain  Faces Pain Scale 0  Cognition  Arousal/Alertness Awake/alert  Behavior During Therapy WFL for tasks assessed/performed  Overall Cognitive Status Within Functional Limits for tasks  assessed  Upper Extremity Assessment  Upper Extremity Assessment RUE deficits/detail  RUE Deficits / Details fatigues when pressing through on RW  RUE Sensation decreased light touch  ADL  Overall ADL's  Needs assistance/impaired  Grooming Wash/dry hands;Wash/dry face;Set up;Sitting  Toilet Transfer Minimal assistance;+2 for safety/equipment;Ambulation;RW;Grab bars;Comfort height toilet  Toilet Transfer Details (indicate cue type and reason) into bathroom, cues for safety  Toileting- Clothing Manipulation and Hygiene Maximal assistance;Sit to/from stand  Toileting - Clothing Manipulation Details (indicate cue type and reason) for rear peri care  Functional mobility during ADLs Minimal assistance;+2 for safety/equipment;Rolling walker  Bed Mobility  Overal bed mobility Needs Assistance  Bed Mobility Rolling;Sidelying to Sit  Rolling Min assist  Sidelying to sit Mod assist  General bed mobility comments pt struggled and needed stability assist; cues for technique  Balance  Overall balance assessment Needs assistance  Sitting-balance support Feet supported  Sitting balance-Leahy Scale Good  Standing balance support Bilateral upper extremity supported  Standing balance-Leahy Scale Poor  Standing balance comment Reliance on bilateral UE support to maintain upright  Restrictions  Other Position/Activity Restrictions cervical - watch wound  Transfers  Overall transfer level Needs assistance  Equipment used Rolling walker (2 wheeled)  Transfers Sit to/from Stand  Sit to Stand Min assist;+2 physical assistance;+2 safety/equipment;From elevated surface  General transfer comment cues for hand placement assist to come forward and boost  OT - End of Session  Equipment Utilized During Treatment Gait belt;Rolling walker;Cervical collar  Activity Tolerance Patient tolerated treatment well  Patient left in chair;with call bell/phone within reach  Nurse Communication Mobility status  OT  Assessment/Plan  OT Plan Discharge  plan remains appropriate;Frequency remains appropriate  OT Visit Diagnosis Unsteadiness on feet (R26.81);Other abnormalities of gait and mobility (R26.89);Repeated falls (R29.6);Muscle weakness (generalized) (M62.81);History of falling (Z91.81);Hemiplegia and hemiparesis  Hemiplegia - Right/Left Right  Hemiplegia - dominant/non-dominant Dominant  Hemiplegia - caused by Unspecified  OT Frequency (ACUTE ONLY) Min 3X/week  Recommendations for Other Services Rehab consult  Follow Up Recommendations CIR;Supervision - Intermittent  OT Equipment 3 in 1 bedside commode  AM-PAC OT "6 Clicks" Daily Activity Outcome Measure  Help from another person eating meals? 4  Help from another person taking care of personal grooming? 3  Help from another person toileting, which includes using toliet, bedpan, or urinal? 3  Help from another person bathing (including washing, rinsing, drying)? 2  Help from another person to put on and taking off regular upper body clothing? 2  Help from another person to put on and taking off regular lower body clothing? 2  6 Click Score 16  ADL G Code Conversion CK  OT Goal Progression  Progress towards OT goals Progressing toward goals  Acute Rehab OT Goals  Patient Stated Goal to return to independent  OT Goal Formulation With patient  Time For Goal Achievement 04/06/18  Potential to Achieve Goals Good  OT Time Calculation  OT Start Time (ACUTE ONLY) 1057  OT Stop Time (ACUTE ONLY) 1135  OT Time Calculation (min) 38 min  OT General Charges  $OT Visit 1 Visit  OT Treatments  $Self Care/Home Management  8-22 mins  $Therapeutic Activity 8-22 mins   Sherryl MangesLaura Eldred Sooy OTR/L [phone]

## 2018-04-11 ENCOUNTER — Inpatient Hospital Stay (HOSPITAL_COMMUNITY)
Admission: RE | Admit: 2018-04-11 | Discharge: 2018-05-02 | DRG: 559 | Disposition: A | Discharge status: Home / Self Care | Source: Intra-hospital | Attending: Physical Medicine & Rehabilitation | Admitting: Physical Medicine & Rehabilitation

## 2018-04-11 ENCOUNTER — Encounter (HOSPITAL_COMMUNITY): Payer: Self-pay

## 2018-04-11 ENCOUNTER — Other Ambulatory Visit: Payer: Self-pay

## 2018-04-11 DIAGNOSIS — F4311 Post-traumatic stress disorder, acute: Secondary | ICD-10-CM | POA: Diagnosis present

## 2018-04-11 DIAGNOSIS — I2699 Other pulmonary embolism without acute cor pulmonale: Secondary | ICD-10-CM | POA: Diagnosis present

## 2018-04-11 DIAGNOSIS — Z4789 Encounter for other orthopedic aftercare: Secondary | ICD-10-CM | POA: Diagnosis present

## 2018-04-11 DIAGNOSIS — I10 Essential (primary) hypertension: Secondary | ICD-10-CM | POA: Diagnosis present

## 2018-04-11 DIAGNOSIS — R52 Pain, unspecified: Secondary | ICD-10-CM

## 2018-04-11 DIAGNOSIS — F4310 Post-traumatic stress disorder, unspecified: Secondary | ICD-10-CM | POA: Diagnosis not present

## 2018-04-11 DIAGNOSIS — G959 Disease of spinal cord, unspecified: Secondary | ICD-10-CM | POA: Diagnosis present

## 2018-04-11 DIAGNOSIS — E8809 Other disorders of plasma-protein metabolism, not elsewhere classified: Secondary | ICD-10-CM | POA: Diagnosis present

## 2018-04-11 DIAGNOSIS — Z886 Allergy status to analgesic agent status: Secondary | ICD-10-CM

## 2018-04-11 DIAGNOSIS — Z981 Arthrodesis status: Secondary | ICD-10-CM

## 2018-04-11 DIAGNOSIS — Z6841 Body Mass Index (BMI) 40.0 and over, adult: Secondary | ICD-10-CM | POA: Diagnosis not present

## 2018-04-11 DIAGNOSIS — M25561 Pain in right knee: Secondary | ICD-10-CM | POA: Diagnosis not present

## 2018-04-11 DIAGNOSIS — D510 Vitamin B12 deficiency anemia due to intrinsic factor deficiency: Secondary | ICD-10-CM | POA: Diagnosis present

## 2018-04-11 DIAGNOSIS — R601 Generalized edema: Secondary | ICD-10-CM

## 2018-04-11 DIAGNOSIS — M109 Gout, unspecified: Secondary | ICD-10-CM | POA: Diagnosis present

## 2018-04-11 DIAGNOSIS — Z882 Allergy status to sulfonamides status: Secondary | ICD-10-CM | POA: Diagnosis not present

## 2018-04-11 DIAGNOSIS — I82443 Acute embolism and thrombosis of tibial vein, bilateral: Secondary | ICD-10-CM | POA: Diagnosis present

## 2018-04-11 DIAGNOSIS — I82890 Acute embolism and thrombosis of other specified veins: Secondary | ICD-10-CM | POA: Diagnosis present

## 2018-04-11 DIAGNOSIS — R195 Other fecal abnormalities: Secondary | ICD-10-CM

## 2018-04-11 DIAGNOSIS — K580 Irritable bowel syndrome with diarrhea: Secondary | ICD-10-CM | POA: Diagnosis present

## 2018-04-11 DIAGNOSIS — D696 Thrombocytopenia, unspecified: Secondary | ICD-10-CM | POA: Diagnosis present

## 2018-04-11 DIAGNOSIS — N319 Neuromuscular dysfunction of bladder, unspecified: Secondary | ICD-10-CM | POA: Diagnosis present

## 2018-04-11 DIAGNOSIS — M10072 Idiopathic gout, left ankle and foot: Secondary | ICD-10-CM

## 2018-04-11 DIAGNOSIS — D62 Acute posthemorrhagic anemia: Secondary | ICD-10-CM

## 2018-04-11 DIAGNOSIS — K592 Neurogenic bowel, not elsewhere classified: Secondary | ICD-10-CM | POA: Diagnosis present

## 2018-04-11 DIAGNOSIS — M1711 Unilateral primary osteoarthritis, right knee: Secondary | ICD-10-CM | POA: Diagnosis not present

## 2018-04-11 DIAGNOSIS — E46 Unspecified protein-calorie malnutrition: Secondary | ICD-10-CM | POA: Diagnosis not present

## 2018-04-11 DIAGNOSIS — G479 Sleep disorder, unspecified: Secondary | ICD-10-CM | POA: Diagnosis present

## 2018-04-11 DIAGNOSIS — I82403 Acute embolism and thrombosis of unspecified deep veins of lower extremity, bilateral: Secondary | ICD-10-CM

## 2018-04-11 DIAGNOSIS — R509 Fever, unspecified: Secondary | ICD-10-CM

## 2018-04-11 LAB — CREATININE, SERUM: Creatinine, Ser: 1.14 mg/dL (ref 0.61–1.24)

## 2018-04-11 LAB — CBC
HEMATOCRIT: 39.3 % (ref 39.0–52.0)
Hemoglobin: 11.8 g/dL — ABNORMAL LOW (ref 13.0–17.0)
MCH: 29.6 pg (ref 26.0–34.0)
MCHC: 30 g/dL (ref 30.0–36.0)
MCV: 98.5 fL (ref 78.0–100.0)
Platelets: 110 10*3/uL — ABNORMAL LOW (ref 150–400)
RBC: 3.99 MIL/uL — ABNORMAL LOW (ref 4.22–5.81)
RDW: 13.9 % (ref 11.5–15.5)
WBC: 9.6 10*3/uL (ref 4.0–10.5)

## 2018-04-11 LAB — PROTIME-INR
INR: 1.12
PROTHROMBIN TIME: 14.3 s (ref 11.4–15.2)

## 2018-04-11 MED ORDER — PANTOPRAZOLE SODIUM 40 MG PO TBEC
40.0000 mg | DELAYED_RELEASE_TABLET | Freq: Every day | ORAL | Status: DC
  Administered 2018-04-12 – 2018-04-15 (×4): 40 mg via ORAL
  Filled 2018-04-11 (×4): qty 1

## 2018-04-11 MED ORDER — LOSARTAN POTASSIUM 25 MG PO TABS
25.0000 mg | ORAL_TABLET | Freq: Every day | ORAL | Status: DC
  Administered 2018-04-12 – 2018-04-30 (×19): 25 mg via ORAL
  Filled 2018-04-11 (×19): qty 1

## 2018-04-11 MED ORDER — DEXAMETHASONE 2 MG PO TABS
2.0000 mg | ORAL_TABLET | Freq: Three times a day (TID) | ORAL | Status: DC
  Administered 2018-04-11 – 2018-04-17 (×17): 2 mg via ORAL
  Filled 2018-04-11 (×18): qty 1

## 2018-04-11 MED ORDER — ONDANSETRON HCL 4 MG/2ML IJ SOLN: 4.0000 mg | Freq: Four times a day (QID) | INTRAMUSCULAR | Status: DC | PRN

## 2018-04-11 MED ORDER — ONDANSETRON HCL 4 MG PO TABS
4.0000 mg | ORAL_TABLET | Freq: Four times a day (QID) | ORAL | Status: DC | PRN
  Administered 2018-04-25: 4 mg via ORAL
  Filled 2018-04-11: qty 1

## 2018-04-11 MED ORDER — ASPIRIN 325 MG PO TABS
325.0000 mg | ORAL_TABLET | Freq: Every day | ORAL | Status: DC
  Administered 2018-04-12 – 2018-04-19 (×8): 325 mg via ORAL
  Filled 2018-04-11 (×8): qty 1

## 2018-04-11 MED ORDER — FUROSEMIDE 20 MG PO TABS
20.0000 mg | ORAL_TABLET | Freq: Every day | ORAL | Status: DC
  Administered 2018-04-12 – 2018-04-30 (×19): 20 mg via ORAL
  Filled 2018-04-11 (×19): qty 1

## 2018-04-11 MED ORDER — DOCUSATE SODIUM 100 MG PO CAPS
100.0000 mg | ORAL_CAPSULE | Freq: Two times a day (BID) | ORAL | Status: DC
  Administered 2018-04-13: 100 mg via ORAL
  Filled 2018-04-11 (×7): qty 1

## 2018-04-11 MED ORDER — ACETAMINOPHEN 325 MG PO TABS
650.0000 mg | ORAL_TABLET | ORAL | Status: DC | PRN
  Administered 2018-04-21 – 2018-04-29 (×8): 650 mg via ORAL
  Filled 2018-04-11 (×7): qty 2

## 2018-04-11 MED ORDER — CYCLOBENZAPRINE HCL 10 MG PO TABS
10.0000 mg | ORAL_TABLET | Freq: Three times a day (TID) | ORAL | Status: DC | PRN
  Administered 2018-04-12: 10 mg via ORAL
  Filled 2018-04-11 (×3): qty 1

## 2018-04-11 MED ORDER — ACETAMINOPHEN 650 MG RE SUPP
650.0000 mg | RECTAL | Status: DC | PRN
Start: 2018-04-11 — End: 2018-05-02

## 2018-04-11 MED ORDER — OXYCODONE HCL 5 MG PO TABS
10.0000 mg | ORAL_TABLET | ORAL | Status: DC | PRN
  Administered 2018-04-12 – 2018-05-02 (×10): 10 mg via ORAL
  Filled 2018-04-11 (×10): qty 2

## 2018-04-11 MED ORDER — BACLOFEN 10 MG PO TABS
10.0000 mg | ORAL_TABLET | Freq: Two times a day (BID) | ORAL | Status: DC
  Administered 2018-04-11 – 2018-05-01 (×40): 10 mg via ORAL
  Filled 2018-04-11 (×40): qty 1

## 2018-04-11 MED ORDER — ENOXAPARIN SODIUM 60 MG/0.6ML ~~LOC~~ SOLN
60.0000 mg | Freq: Two times a day (BID) | SUBCUTANEOUS | Status: DC
  Administered 2018-04-12 – 2018-04-18 (×12): 60 mg via SUBCUTANEOUS
  Filled 2018-04-11 (×15): qty 0.6

## 2018-04-11 NOTE — Care Management Note (Signed)
Case Management Note  Patient Details  Name: Azucena FreedCornell Derouin MRN: [id number] Date of Birth: 1965/12/16  Subjective/Objective:  Patient is a 52 y/o male with PMH of HTN, DDD lumbar spine and gout.  He was admitted after a fall at home with inability to get up.  Reports one other recent fall when his dog pulled him over.  States recent R hand numbness,tingling and imbalance.  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  PTA, pt independent, and living alone in extended stay hotel.                    Action/Plan: PT/OT recommending CIR; he plans to dc home with parents at discharge.  Noted complications of last 24h with emergent surgery and bilateral PE/DVT.  Will follow for discharge planning as pt progresses.  Expected Discharge Date:  04/11/2018          Expected Discharge Plan:  IP Rehab Facility  In-House Referral:     Discharge planning Services  CM Consult  Post Acute Care Choice:    Choice offered to:     DME Arranged:    DME Agency:     HH Arranged:    HH Agency:     Status of Service:  Completed, signed off  If discussed at [employer] Meetings, dates discussed:    Additional Comments:  04/11/18 J. Raegyn Renda, RN, BSN Pt medically stable for discharge, and insurance authorization received for admission to CIR.  Plan dc to [facility] later today.    Quintella BatonJulie W. Hardy Harcum, RN, BSN  Trauma/Neuro ICU Case Manager [phone]

## 2018-04-11 NOTE — H&P (Signed)
Physical Medicine and Rehabilitation Admission H&P       Chief Complaint  Patient presents with  . Fall  : HPI: [name] [name] is a 52 year old right-handed male with history of hypertension, pernicious anemia, morbid obesity with BMI 45.29, gout as well as long-standing history of low back pain.  Per chart review patient lives alone.  Plans to stay with his parents on discharge and assistance as needed.  One level home with 5 steps to entry.  Presented 03/22/2018 with decrease in balance and numbness in his hand primarily on the right dragging of his right leg.  He had a recent fall x2 where he fell struck his head neck.  Patient also notes recent increase in constipation as well as bouts of urinary retention.  Cranial CT scan showed no acute intracranial process.  MRI of the thoracic lumbar spine showed severe cord compression extending from C3-4 down to C4-5.  Underwent anterior cervical corpectomy of C4, microscopic dissection and foraminotomies of the C4 and C5 nerve root 03/24/2018 per Dr. Wynetta Emery.  Cervical collar placed postoperatively.  Hospital course pain management.  Rapid response 03/25/2018 after patient unresponsive after transferring from chair to bed heart rate in the 30s.  Initially hypoxic during event placed on NRB and responded well after NS bolus of 250 cc.  Patient again on 715 with increasing shortness of breath CT angiogram of the chest showed bilateral acute pulmonary emboli, occlusive within multiple segmental and subsegmental branches.  Venous Dopplers positive for acute DVT involving bilateral posterior tibial and peroneal veins.  Venous Dopplers upper extremities negative.  Initially placed on intravenous heparin and transitioned to Coumadin.  Cardiology services consulted for bradycardia.  EKG sinus rhythm.  Echocardiogram with ejection fraction of 55% grade 2 diastolic dysfunction.  Underwent placement of IVC filter per interventional radiology 03/28/2018 as well as  reexploration of anterior cervical wound following initial heparin anticoagulation for active oozing from site.  Patient's heparin and Coumadin were discontinued due to sensitivity of anticoagulation and did receive vitamin K.  Initial attempts to transition to Lovenox however increased oozing again from the site and Lovenox later discontinued.  Follow-up oncology hematology services 04/07/2018 and Lovenox conservatively again initiated 40 mg daily.  Plan was for Lovenox 40 mg daily x48 hours then changed to 60 mg every 12 hours x1 week and then again if no bleeding transition to Eliquis 5 mg twice daily.  Close monitoring of hemoglobin hematocrit.  Physical and occupational therapy evaluations completed with recommendations of physical medicine rehab consult.  Patient was admitted for a comprehensive rehab program.  Review of Systems  Constitutional: Negative for fever.  HENT: Negative for hearing loss.   Eyes: Negative for blurred vision.  Respiratory: Negative for cough.   Cardiovascular: Negative for chest pain.       Leg swelling and shortness of breath  Gastrointestinal: Negative for nausea and vomiting.  Genitourinary: Negative for hematuria.  Musculoskeletal: Positive for myalgias.       Back pain and myalgias  Skin: Negative for rash.  Neurological: Positive for sensory change and focal weakness. Negative for dizziness and headaches.  Psychiatric/Behavioral: Negative for suicidal ideas.  All other systems reviewed and are negative.      Past Medical History:  Diagnosis Date  . Degenerative disc disease, lumbar   . Gout   . History of pernicious anemia 1980   Patient says he takes OTC iron supplements QD  . Hypertension         Past  Surgical History:  Procedure Laterality Date  . ANTERIOR CERVICAL CORPECTOMY N/A 03/24/2018   Procedure: CERVICAL FOUR ANTERIOR CERVICAL CORPECTOMY;  Surgeon: Donalee Citrin, MD;  Location: [facility] OR;  Service: Neurosurgery;  Laterality: N/A;  .  ANTERIOR CERVICAL DECOMP/DISCECTOMY FUSION N/A 03/28/2018   Procedure: Reexploration of anterior cervical wound;  Surgeon: Donalee Citrin, MD;  Location: [facility] OR;  Service: Neurosurgery;  Laterality: N/A;  . IR IVC FILTER PLMT / S&I Lenise Arena GUID/MOD SED  03/28/2018  . MENISCUS REPAIR Left   . TENDON REPAIR     tricept tendon   History reviewed. No pertinent family history. Social History:  reports that he has never smoked. He has never used smokeless tobacco. He reports that he does not drink alcohol or use drugs. Allergies:       Allergies  Allergen Reactions  . Nsaids Other (See Comments)    G6PD  . Sulfa Antibiotics Other (See Comments)    G6PD         Medications Prior to Admission  Medication Sig Dispense Refill  . naproxen sodium (ALEVE) 220 MG tablet Take 220 mg by mouth 2 (two) times daily as needed (pain).      Drug Regimen Review Drug regimen was reviewed and remains appropriate with no significant issues identified  Home: Home Living Family/patient expects to be discharged to:: Private residence Living Arrangements: Parent, Alone(was living alone at extended stay, says he can stay with parents at discharge) Available Help at Discharge: Family Type of Home: House Home Access: Stairs to enter [employer] of Steps: 5 in front, 3 in garage Entrance Stairs-Rails: Right Home Layout: One level Bathroom Shower/Tub: Engineer, manufacturing systems: Standard Home Equipment: None   Functional History: Prior Function Level of Independence: Independent 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[employer], was CNA  Functional Status:  Mobility: Bed Mobility Overal bed mobility: Needs Assistance Bed Mobility: Rolling, Sidelying to Sit Rolling: Min assist Sidelying to sit: Min assist General bed mobility comments: pt struggled and needed stability assist; cues for technique Transfers Overall transfer level: Needs assistance Equipment used: Rolling walker (2  wheeled) Transfers: Sit to/from Stand Sit to Stand: Min assist, +2 physical assistance Stand pivot transfers: Min assist, +2 safety/equipment General transfer comment: cues for hand placement Ambulation/Gait Ambulation/Gait assistance: Min assist, +2 physical assistance Gait Distance (Feet): 130 Feet(45 feet back to room.) Assistive device: Rolling walker (2 wheeled) Gait Pattern/deviations: Step-through pattern General Gait Details: heavy use of the RW.  Pt able to stand upright to cue today.  Unsteady at R knee.  Weak gastrocsoleus bil.  Pt needed a rest before continuing on to the room. Gait velocity: decreased Gait velocity interpretation: <1.8 ft/sec, indicate of risk for recurrent falls  ADL: ADL Overall ADL's : Needs assistance/impaired Eating/Feeding: Independent, Sitting Eating/Feeding Details (indicate cue type and reason): with increased time, says he cannot cut up food, but can use spoon and fork with increased time--prefers finger food right now. Did issue pt a rocker knife Grooming: Wash/dry hands, Wash/dry face, Set up, Sitting Grooming Details (indicate cue type and reason): with increased time Upper Body Bathing: Set up, Supervision/ safety, Sitting Upper Body Bathing Details (indicate cue type and reason): with increased time Lower Body Bathing: Minimal assistance Lower Body Bathing Details (indicate cue type and reason): min A sit<>stand Upper Body Dressing : Minimal assistance, Sitting Upper Body Dressing Details (indicate cue type and reason): with increased time Lower Body Dressing: Moderate assistance Lower Body Dressing Details (indicate cue type and reason): min A  sit<>stand Toilet Transfer: Minimal assistance, +2 for safety/equipment, Ambulation, RW, Grab bars, Comfort height toilet Toilet Transfer Details (indicate cue type and reason): into bathroom, cues for safety Toileting- Clothing Manipulation and Hygiene: Maximal assistance, Sit to/from  stand Toileting - Clothing Manipulation Details (indicate cue type and reason): for rear peri care Functional mobility during ADLs: Minimal assistance, +2 for safety/equipment, Rolling walker General ADL Comments: reviewed cervical precautions with Pt  Cognition: Cognition Overall Cognitive Status: Within Functional Limits for tasks assessed Orientation Level: Oriented X4 Cognition Arousal/Alertness: Awake/alert Behavior During Therapy: WFL for tasks assessed/performed Overall Cognitive Status: Within Functional Limits for tasks assessed  Physical Exam: Blood pressure 128/78, pulse (!) 56, temperature (!) 97 F (36.1 C), temperature source Oral, resp. rate 17, height 5\' 9"  (1.753 m), weight (!) 139.1 kg (306 lb 10.6 oz), SpO2 97 %. Physical Exam  Constitutional: He is oriented to person, place, and time. He appears well-developed. No distress.  HENT:  Head: Normocephalic and atraumatic.  Eyes: Pupils are equal, round, and reactive to light.  Neck: Normal range of motion.  Cervical collar, wound intact  Cardiovascular: Normal rate and regular rhythm. Exam reveals no friction rub.  No murmur heard. Respiratory: Effort normal. No respiratory distress. He has no wheezes. He has no rales.  GI: He exhibits no distension. There is no tenderness. There is no rebound.  Musculoskeletal: He exhibits edema (2+ RLE and trace to 1+ LLE) and tenderness.  Neurological: He is alert and oriented to person, place, and time.  RUE 2-3/5 prox to distal. LUE 3 to 4-/5, RLE 1/5 prox to 4/5 ADF/PF, LLE: 2-3/5 prox to 4/5 distally. Decreased LT right leg, bilateral fingers and toes, right more affected than left.   Skin:  Incision site is currently clean and dry    LabResultsLast48Hours        Results for orders placed or performed during the hospital encounter of 03/22/18 (from the past 48 hour(s))  Protime-INR     Status: None   Collection Time: 04/10/18  3:28 AM  Result Value Ref Range    Prothrombin Time 13.8 11.4 - 15.2 seconds   INR 1.07     Comment: Performed at [facility], [address] [place], [place] [address]  Protime-INR     Status: None   Collection Time: 04/11/18  3:48 AM  Result Value Ref Range   Prothrombin Time 14.3 11.4 - 15.2 seconds   INR 1.12     Comment: Performed at [facility], [address] [place], [place] [address]     ImagingResults(Last48hours)  No results found.       Medical Problem List and Plan: 1.  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  Cervical collar as directed             -admit to inpatient rehab 2.  DVT Prophylaxis/Anticoagulation/bilateral pulmonary emboli/bilateral posterior tibial peroneal vein DVT: Lovenox 60 mg every 12 hours x1 week then begin Eliquis 5 mg twice daily if no bleeding.                    -no clinical signs of bleeding, hgb has been stable 3. Pain Management: Baclofen 10 mg twice daily, Flexeril and oxycodone as needed 4. Mood: Provide emotional support 5. Neuropsych: This patient is capable of making decisions on his own behalf. 6. Skin/Wound Care: Routine skin checks 7. Fluids/Electrolytes/Nutrition:  Routine in and outs with follow-up chemistries 8.  Neurogenic bowel and bladder.  Established bowel program.  Check PVR x3 9.  Hypertension.  Cozaar 25 mg daily.  Monitor with increased mobility 10.  Morbid obesity.  BMI 45.29.  Dietary follow-up 11.  History of pernicious anemia.  Follow-up labs     Post Admission Physician Evaluation: 1. Functional deficits secondary  to cervical myelopathy. 2. Patient is admitted to receive collaborative, interdisciplinary care between the physiatrist, rehab nursing staff, and therapy team. 3. Patient's level of medical complexity and substantial therapy needs in context of that medical necessity  cannot be provided at a lesser intensity of care such as a SNF. 4. Patient has experienced substantial functional loss from his/her baseline which was documented above under the "Functional History" and "Functional Status" headings.  Judging by the patient's diagnosis, physical exam, and functional history, the patient has potential for functional progress which will result in measurable gains while on inpatient rehab.  These gains will be of substantial and practical use upon discharge  in facilitating mobility and self-care at the household level. 5. Physiatrist will provide 24 hour management of medical needs as well as oversight of the therapy plan/treatment and provide guidance as appropriate regarding the interaction of the two. 6. The Preadmission Screening has been reviewed and patient status is unchanged unless otherwise stated above. 7. 24 hour rehab nursing will assist with bladder management, bowel management, safety, skin/wound care, disease management, medication administration, pain management and patient education  and help integrate therapy concepts, techniques,education, etc. 8. PT will assess and treat for/with: Lower extremity strength, range of motion, stamina, balance, functional mobility, safety, adaptive techniques and equipment, NMR, pain mgt, family education.   Goals are: mod I to supervision. 9. OT will assess and treat for/with: ADL's, functional mobility, safety, upper extremity strength, adaptive techniques and equipment, NMR, pain control, family ed.   Goals are: mod I to supervision. Therapy may proceed with showering this patient. 10. SLP will assess and treat for/with: n/a.  Goals are: n/a. 11. Case Management and Social Worker will assess and treat for psychological issues and discharge planning. 12. Team conference will be held weekly to assess progress toward goals and to determine barriers to discharge. 13. Patient will receive at least 3 hours of therapy per day at  least 5 days per week. 14. ELOS: 10-14 days       15. Prognosis:  excellent   I have personally performed a face to face diagnostic evaluation of this patient and formulated the key components of the plan.  Additionally, I have personally reviewed laboratory data, imaging studies, as well as relevant notes and concur with the physician assistant's documentation above.  Ranelle Oyster, MD, FAAPMR     Mcarthur Rossetti Angiulli, PA-C 04/11/2018

## 2018-04-11 NOTE — Progress Notes (Signed)
NEUROSURGERY PROGRESS NOTE  Doing well. Received his full dose of Lovenox last night without complications.   Temp:  [97 F (36.1 C)-98.3 F (36.8 C)] 97 F (36.1 C) (07/30 0300) Pulse Rate:  [56-64] 56 (07/29 2240) Resp:  [10-19] 17 (07/30 0300) BP: (106-128)/(62-78) 128/78 (07/30 0300) SpO2:  [95 %-98 %] 97 % (07/30 0300)  Plan: Doing well, will D/C to CIR today if ok with rehab MD.   Sherryl MangesKimberly Hannah Meyran, NP 04/11/2018 8:03 AM

## 2018-04-11 NOTE — Progress Notes (Signed)
Call placed to Dr Candise Chekale office in reference to Lovenox order needs to be clarified per note order should be for BID instead of qd x 1 week. Awaiting call back

## 2018-04-11 NOTE — H&P (Signed)
Physical Medicine and Rehabilitation Admission H&P    Chief Complaint  Patient presents with  . Fall  : HPI: [name] [name] is a 52 year old right-handed male with history of hypertension, pernicious anemia, morbid obesity with BMI 45.29, gout as well as long-standing history of low back pain.  Per chart review patient lives alone.  Plans to stay with his parents on discharge and assistance as needed.  One level home with 5 steps to entry.  Presented 03/22/2018 with decrease in balance and numbness in his hand primarily on the right dragging of his right leg.  He had a recent fall x2 where he fell struck his head neck.  Patient also notes recent increase in constipation as well as bouts of urinary retention.  Cranial CT scan showed no acute intracranial process.  MRI of the thoracic lumbar spine showed severe cord compression extending from C3-4 down to C4-5.  Underwent anterior cervical corpectomy of C4, microscopic dissection and foraminotomies of the C4 and C5 nerve root 03/24/2018 per Dr. Wynetta Emery.  Cervical collar placed postoperatively.  Hospital course pain management.  Rapid response 03/25/2018 after patient unresponsive after transferring from chair to bed heart rate in the 30s.  Initially hypoxic during event placed on NRB and responded well after NS bolus of 250 cc.  Patient again on 715 with increasing shortness of breath CT angiogram of the chest showed bilateral acute pulmonary emboli, occlusive within multiple segmental and subsegmental branches.  Venous Dopplers positive for acute DVT involving bilateral posterior tibial and peroneal veins.  Venous Dopplers upper extremities negative.  Initially placed on intravenous heparin and transitioned to Coumadin.  Cardiology services consulted for bradycardia.  EKG sinus rhythm.  Echocardiogram with ejection fraction of 55% grade 2 diastolic dysfunction.  Underwent placement of IVC filter per interventional radiology 03/28/2018 as well as reexploration  of anterior cervical wound following initial heparin anticoagulation for active oozing from site.  Patient's heparin and Coumadin were discontinued due to sensitivity of anticoagulation and did receive vitamin K.  Initial attempts to transition to Lovenox however increased oozing again from the site and Lovenox later discontinued.  Follow-up oncology hematology services 04/07/2018 and Lovenox conservatively again initiated 40 mg daily.  Plan was for Lovenox 40 mg daily x48 hours then changed to 60 mg every 12 hours x1 week and then again if no bleeding transition to Eliquis 5 mg twice daily.  Close monitoring of hemoglobin hematocrit.  Physical and occupational therapy evaluations completed with recommendations of physical medicine rehab consult.  Patient was admitted for a comprehensive rehab program.  Review of Systems  Constitutional: Negative for fever.  HENT: Negative for hearing loss.   Eyes: Negative for blurred vision.  Respiratory: Negative for cough.   Cardiovascular: Negative for chest pain.       Leg swelling and shortness of breath  Gastrointestinal: Negative for nausea and vomiting.  Genitourinary: Negative for hematuria.  Musculoskeletal: Positive for myalgias.       Back pain and myalgias  Skin: Negative for rash.  Neurological: Positive for sensory change and focal weakness. Negative for dizziness and headaches.  Psychiatric/Behavioral: Negative for suicidal ideas.  All other systems reviewed and are negative.  Past Medical History:  Diagnosis Date  . Degenerative disc disease, lumbar   . Gout   . History of pernicious anemia 1980   Patient says he takes OTC iron supplements QD  . Hypertension    Past Surgical History:  Procedure Laterality Date  . ANTERIOR CERVICAL CORPECTOMY N/A  03/24/2018   Procedure: CERVICAL FOUR ANTERIOR CERVICAL CORPECTOMY;  Surgeon: Donalee Citrinram, Gary, MD;  [facility];  Service: Neurosurgery;  Laterality: N/A;  . ANTERIOR CERVICAL DECOMP/DISCECTOMY  FUSION N/A 03/28/2018   Procedure: Reexploration of anterior cervical wound;  Surgeon: Donalee Citrinram, Gary, MD;  [facility];  Service: Neurosurgery;  Laterality: N/A;  . IR IVC FILTER PLMT / S&I Lenise Arena/IMG GUID/MOD SED  03/28/2018  . MENISCUS REPAIR Left   . TENDON REPAIR     tricept tendon   History reviewed. No pertinent family history. Social History:  reports that he has never smoked. He has never used smokeless tobacco. He reports that he does not drink alcohol or use drugs. Allergies:  Allergies  Allergen Reactions  . Nsaids Other (See Comments)    G6PD  . Sulfa Antibiotics Other (See Comments)    G6PD   Medications Prior to Admission  Medication Sig Dispense Refill  . naproxen sodium (ALEVE) 220 MG tablet Take 220 mg by mouth 2 (two) times daily as needed (pain).      Drug Regimen Review Drug regimen was reviewed and remains appropriate with no significant issues identified  Home: Home Living Family/patient expects to be discharged to:: Private residence Living Arrangements: Parent, Alone(was living alone at extended stay, says he can stay with parents at discharge) Available Help at Discharge: Family Type of Home: House Home Access: Stairs to enter Enter[employer] CorporationEntrance Stairs-Number of Steps: 5 in front, 3 in garage Entrance Stairs-Rails: Right Home Layout: One level Bathroom Shower/Tub: Engineer, manufacturing systemsTub/shower unit Bathroom Toilet: Standard Home Equipment: None   Functional History: Prior Function Level of Independence: Independent 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[employer], was CNA  Functional Status:  Mobility: Bed Mobility Overal bed mobility: Needs Assistance Bed Mobility: Rolling, Sidelying to Sit Rolling: Min assist Sidelying to sit: Min assist General bed mobility comments: pt struggled and needed stability assist; cues for technique Transfers Overall transfer level: Needs assistance Equipment used: Rolling walker (2 wheeled) Transfers: Sit to/from Stand Sit to Stand: Min assist, +2  physical assistance Stand pivot transfers: Min assist, +2 safety/equipment General transfer comment: cues for hand placement Ambulation/Gait Ambulation/Gait assistance: Min assist, +2 physical assistance Gait Distance (Feet): 130 Feet(45 feet back to room.) Assistive device: Rolling walker (2 wheeled) Gait Pattern/deviations: Step-through pattern General Gait Details: heavy use of the RW.  Pt able to stand upright to cue today.  Unsteady at R knee.  Weak gastrocsoleus bil.  Pt needed a rest before continuing on to the room. Gait velocity: decreased Gait velocity interpretation: <1.8 ft/sec, indicate of risk for recurrent falls    ADL: ADL Overall ADL's : Needs assistance/impaired Eating/Feeding: Independent, Sitting Eating/Feeding Details (indicate cue type and reason): with increased time, says he cannot cut up food, but can use spoon and fork with increased time--prefers finger food right now. Did issue pt a rocker knife Grooming: Wash/dry hands, Wash/dry face, Set up, Sitting Grooming Details (indicate cue type and reason): with increased time Upper Body Bathing: Set up, Supervision/ safety, Sitting Upper Body Bathing Details (indicate cue type and reason): with increased time Lower Body Bathing: Minimal assistance Lower Body Bathing Details (indicate cue type and reason): min A sit<>stand Upper Body Dressing : Minimal assistance, Sitting Upper Body Dressing Details (indicate cue type and reason): with increased time Lower Body Dressing: Moderate assistance Lower Body Dressing Details (indicate cue type and reason): min A sit<>stand Toilet Transfer: Minimal assistance, +2 for safety/equipment, Ambulation, RW, Grab bars, Comfort height toilet Toilet Transfer Details (indicate cue type  and reason): into bathroom, cues for safety Toileting- Clothing Manipulation and Hygiene: Maximal assistance, Sit to/from stand Toileting - Clothing Manipulation Details (indicate cue type and reason):  for rear peri care Functional mobility during ADLs: Minimal assistance, +2 for safety/equipment, Rolling walker General ADL Comments: reviewed cervical precautions with Pt  Cognition: Cognition Overall Cognitive Status: Within Functional Limits for tasks assessed Orientation Level: Oriented X4 Cognition Arousal/Alertness: Awake/alert Behavior During Therapy: WFL for tasks assessed/performed Overall Cognitive Status: Within Functional Limits for tasks assessed  Physical Exam: Blood pressure 128/78, pulse (!) 56, temperature (!) 97 F (36.1 C), temperature source Oral, resp. rate 17, height 5\' 9"  (1.753 m), weight (!) 139.1 kg (306 lb 10.6 oz), SpO2 97 %. Physical Exam  Constitutional: He is oriented to person, place, and time. He appears well-developed. No distress.  HENT:  Head: Normocephalic and atraumatic.  Eyes: Pupils are equal, round, and reactive to light.  Neck: Normal range of motion.  Cervical collar, wound intact  Cardiovascular: Normal rate and regular rhythm. Exam reveals no friction rub.  No murmur heard. Respiratory: Effort normal. No respiratory distress. He has no wheezes. He has no rales.  GI: He exhibits no distension. There is no tenderness. There is no rebound.  Musculoskeletal: He exhibits edema (2+ RLE and trace to 1+ LLE) and tenderness.  Neurological: He is alert and oriented to person, place, and time.  RUE 2-3/5 prox to distal. LUE 3 to 4-/5, RLE 1/5 prox to 4/5 ADF/PF, LLE: 2-3/5 prox to 4/5 distally. Decreased LT right leg, bilateral fingers and toes, right more affected than left.   Skin:  Incision site is currently clean and dry    Results for orders placed or performed during the hospital encounter of 03/22/18 (from the past 48 hour(s))  Protime-INR     Status: None   Collection Time: 04/10/18  3:28 AM  Result Value Ref Range   Prothrombin Time 13.8 11.4 - 15.2 seconds   INR 1.07     Comment: Performed at [facility], [address]  [place], [place] [address]  Protime-INR     Status: None   Collection Time: 04/11/18  3:48 AM  Result Value Ref Range   Prothrombin Time 14.3 11.4 - 15.2 seconds   INR 1.12     Comment: Performed at [facility], [address] [place], [place] [address]   No results found.     Medical Problem List and Plan: 1.  Right upper extremity weakness and bilateral lower extremity weakness OPLL secondary to cervical myelopathy status post C3-C5 ACDF 03/24/2018.  Cervical collar as directed  -admit to inpatient rehab 2.  DVT Prophylaxis/Anticoagulation/bilateral pulmonary emboli/bilateral posterior tibial peroneal vein DVT: Lovenox 60 mg every 12 hours x1 week then begin Eliquis 5 mg twice daily if no bleeding.    -no clinical signs of bleeding, hgb has been stable 3. Pain Management: Baclofen 10 mg twice daily, Flexeril and oxycodone as needed 4. Mood: Provide emotional support 5. Neuropsych: This patient is capable of making decisions on his own behalf. 6. Skin/Wound Care: Routine skin checks 7. Fluids/Electrolytes/Nutrition: Routine in and outs with follow-up chemistries 8.  Neurogenic bowel and bladder.  Established bowel program.  Check PVR x3 9.  Hypertension.  Cozaar 25 mg daily.  Monitor with increased mobility 10.  Morbid obesity.  BMI 45.29.  Dietary follow-up 11.  History of pernicious anemia.  Follow-up labs       Charlton Amor, PA-C 04/11/2018

## 2018-04-11 NOTE — Progress Notes (Signed)
Report received from Norlene DuelJemika Frye RN for transfer to room [address] 19 from 4NP10. Complete hx and treatments reviewed and skin issues.

## 2018-04-11 NOTE — Progress Notes (Signed)
Inpatient Rehabilitation-Admissions Coordinator   [facility] both [facility] and insurance approval for admit to CIR. Pt will be admitted to CIR today. Pt, floor RN, CM and SW updated on plan. Please call if questions.   Nanine MeansKelly Shah Insley, OTR/L  Rehab Admissions Coordinator  [phone] 04/11/2018 3:18 PM

## 2018-04-11 NOTE — IPOC Note (Addendum)
Overall Plan of Care [facility]) Patient Details Name: [name] [name] MRN: [id number] DOB: 1966/08/25  Admitting Diagnosis: Cervical myelopathy  Hospital Problems: Active Problems:   Cervical myelopathy (HCC)   Anasarca   Thrombocytopenia (HCC)   Morbid obesity (HCC)   Acute blood loss anemia   Benign essential HTN   Hypoalbuminemia due to protein-calorie malnutrition (HCC)   PTSD (post-traumatic stress disorder)   Sleep disorder     Functional Problem List: Nursing Bladder, Edema, Endurance, Bowel, Medication Management, Skin Integrity, Safety, Motor, Pain  PT Balance, Edema, Endurance, Motor, Pain, Safety, Sensory, Skin Integrity  OT Balance, Endurance, Motor, Sensory  SLP    TR         Basic ADL's: OT Eating, Grooming, Bathing, Dressing, Toileting     Advanced  ADL's: OT Simple Meal Preparation     Transfers: PT Bed Mobility, Bed to Chair, Car, [employer], Floor  OT Tub/Shower     Locomotion: PT Ambulation, Psychologist, prison and probation services, Stairs     Additional Impairments: OT Fuctional Use of Upper Extremity  SLP        TR      Anticipated Outcomes Item Anticipated Outcome  Self Feeding modified independent  Swallowing      Basic self-care  supervision  Toileting  supervision   Bathroom Transfers supervision  Bowel/Bladder  Continent of bowel/bladder with Mod I  Transfers  Supervision assist with LRAD   Locomotion  ambulatory at household level with supervision assist   Communication     Cognition     Pain  <2  Safety/Judgment  Able to call  for help with Mod I   Therapy Plan: PT Intensity: Minimum of 1-2 x/day ,45 to 90 minutes PT Frequency: 5 out of 7 days PT Duration Estimated Length of Stay: 12-15 days  OT Intensity: Minimum of 1-2 x/day, 45 to 90 minutes OT Frequency: 5 out of 7 days OT Duration/Estimated Length of Stay: 12-14 days      Team Interventions: Nursing Interventions Patient/Family Education, Disease Management/Prevention, Skin  Care/Wound Management, Discharge Planning, Bladder Management, Pain Management, Bowel Management, Medication Management  PT interventions Ambulation/gait training, Warden/ranger, Community reintegration, Discharge planning, Disease management/prevention, DME/adaptive equipment instruction, Functional mobility training, Functional electrical stimulation, Patient/family education, Neuromuscular re-education, Pain management, Psychosocial support, Skin care/wound management, Splinting/orthotics, Therapeutic Exercise, Therapeutic Activities, Stair training, UE/LE Coordination activities, UE/LE Strength taining/ROM, Visual/perceptual remediation/compensation, Wheelchair propulsion/positioning  OT Interventions Warden/ranger, DME/adaptive equipment instruction, Discharge planning, Pain management, Self Care/advanced ADL retraining, Therapeutic Activities, UE/LE Coordination activities, Patient/family education, Functional mobility training, Neuromuscular re-education, Therapeutic Exercise, UE/LE Strength taining/ROM, Wheelchair propulsion/positioning, Community reintegration  SLP Interventions    TR Interventions    SW/CM Interventions Discharge Planning, Psychosocial Support, Patient/Family Education   Barriers to Discharge MD  Medical stability, Wound care and Weight  Nursing Medical stability Patient siad he will be moving in with his parents  PT Inaccessible home environment, Decreased caregiver support, Medical stability, Home environment access/layout    OT      SLP      SW       Team Discharge Planning: Destination: PT-Home ,OT- Home , SLP-  Projected Follow-up: PT-Home health PT, OT-  24 hour supervision/assistance, SLP-  Projected Equipment Needs: PT-Wheelchair cushion (measurements), Wheelchair (measurements), Rolling walker with 5" wheels, OT- Tub/shower bench, SLP-  Equipment Details: PT- , OT-  Patient/family involved in discharge planning: PT- Patient,   OT-Patient, SLP-   MD ELOS: 12-15 days. Medical Rehab Prognosis:  Good  Assessment: 52 year old right-handed male with  history of hypertension, pernicious anemia, morbid obesity with BMI 45.29, gout, as well as long-standing history of low back pain. Presented 03/22/2018 with decrease in balance and numbness in his hand primarily on the right dragging of his right leg. He had a recent fall x2 where he fell struck his head neck. Patient also notes recent increase in constipation as well as bouts of urinary retention. Cranial CT scan showed no acute intracranial process. MRI of the thoracic lumbar spine showed severe cord compression extending from C3-4 down to C4-5. Underwent anterior cervical corpectomy of C4, microscopic dissection and foraminotomies of the C4 and C5 nerve root 03/24/2018 per Dr. Wynetta Emeryram. Cervical collar placed postoperatively. Hospital course pain management. Rapid response 03/25/2018 after patient unresponsive after transferring from chair to bed heart rate in the 30s. Initially hypoxic during event placed on NRB and responded well after NS bolus of 250 cc. Patient again on 715 with increasing shortness of breath CT angiogram of the chest showed bilateral acute pulmonary emboli, occlusive within multiple segmental and subsegmental branches. Venous Dopplers positive for acute DVT involving bilateral posterior tibial and peroneal veins. Venous Dopplers upper extremities negative. Initially placed on intravenous heparin and transitioned to Coumadin. Cardiology services consulted for bradycardia. EKG sinus rhythm. Echocardiogram with ejection fraction of 55% grade 2 diastolic dysfunction. Underwent placement of IVC filter per interventional radiology 03/28/2018 as well as reexploration of anterior cervical wound following initial heparin anticoagulation for active oozing from site. Patient's heparin and Coumadin were discontinued due to sensitivity of anticoagulation and did receive  vitamin K. Initial attempts to transition to Lovenox however increased oozing again from the site and Lovenox later discontinued. Follow-up oncology hematology services 04/07/2018 and Lovenox conservatively again initiated 40 mg daily. Plan was for Lovenox 40 mg daily x48 hours then changed to 60 mg every 12 hours x1 week and then again if no bleeding transition to Eliquis 5 mg twice daily. Close monitoring of hemoglobin hematocrit. Patient with resulting functional deficits with endurance, mobility, safety.  Will set goals supervision with PT/OT.  See Team Conference Notes for weekly updates to the plan of care

## 2018-04-11 NOTE — Progress Notes (Signed)
Physical Medicine and Rehabilitation Consult Reason for Consult: Decreased functional mobility Referring Physician: [facility]   HPI: [name] [name] is a 52 y.o. right-handed male with history of hypertension, pernicious anemia, morbid obesity with BMI 45.29, gout as well as long-standing history of low back pain.  Per chart review patient lives alone.  Plans to stay with his parents on discharge and assistance as needed.  One level home with 5 steps to entry.  Presented 03/22/2018 with decrease in balance and numbness in his hand primarily on the right and dragging of his right leg.  He had had a recent fall x2 where he fell and struck his head and neck.  Patient also notes recent increase in constipation as well as bouts of urinary retention.  Cranial CT scan showed no acute intracranial process.  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  Underwent anterior cervical corpectomy of C4, microscopic dissection and foraminotomies of the C4 and C5 nerve roots 03/24/2018 per Dr. Wynetta Emery.  Cervical collar was placed postoperatively.  Hospital course pain management.  Rapid response 03/25/2018 after patient unresponsive after transferring from chair to bed and heart rate in the 30s.  Patient initially hypoxic during event placed on NRB and responded well after a NS bolus of 250 cc.  Patient again on 7/15 with increasing shortness of breath CT angiogram of the chest showed bilateral acute pulmonary emboli, occlusive within multiple segmental and subsegmental branches.  Venous Doppler studies positive for acute DVT involving bilateral posterior tibial and peroneal veins.  Placed on intravenous heparin.  Echocardiogram with ejection fraction of 55% grade 2 diastolic dysfunction.  Underwent placement of IVC filter per interventional radiology 03/28/2018 as well as reexploration of anterior cervical wound following initial heparin anticoagulation and no active bleeding  noted.  Follow-up oncology services in regards to anticoagulation in light of DVT/PE with work-up currently ongoing.  Physical and occupational therapy evaluations completed with recommendations of physical medicine rehab consult.  \Patient states that he was very active prior to admission he was working at the [employer].  There is deficits came on "overnight"  Review of Systems  Constitutional: Negative for chills and fever.  HENT: Negative for hearing loss.   Eyes: Negative for blurred vision and double vision.  Respiratory: Positive for shortness of breath.   Cardiovascular: Positive for leg swelling.  Gastrointestinal: Positive for constipation. Negative for nausea and vomiting.  Genitourinary:       Some urinary retention  Musculoskeletal: Positive for back pain, myalgias and neck pain.  Skin: Negative for rash.  Neurological: Positive for sensory change and focal weakness. Negative for seizures.  All other systems reviewed and are negative.      Past Medical History:  Diagnosis Date  . Degenerative disc disease, lumbar   . Gout   . History of pernicious anemia 1980   Patient says he takes OTC iron supplements QD  . Hypertension         Past Surgical History:  Procedure Laterality Date  . ANTERIOR CERVICAL CORPECTOMY N/A 03/24/2018   Procedure: CERVICAL FOUR ANTERIOR CERVICAL CORPECTOMY;  Surgeon: Donalee Citrin, MD;  Location: [facility] OR;  Service: Neurosurgery;  Laterality: N/A;  . ANTERIOR CERVICAL DECOMP/DISCECTOMY FUSION N/A 03/28/2018   Procedure: Reexploration of anterior cervical wound;  Surgeon: Donalee Citrin, MD;  Location: [facility] OR;  Service: Neurosurgery;  Laterality: N/A;  . IR IVC FILTER PLMT / S&I Lenise Arena GUID/MOD SED  03/28/2018  . MENISCUS REPAIR Left   .  TENDON REPAIR     tricept tendon   History reviewed. No pertinent family history. Social History:  reports that he has never smoked. He has never used smokeless tobacco. He reports that he does not drink alcohol or  use drugs. Allergies:       Allergies  Allergen Reactions  . Nsaids Other (See Comments)    G6PD  . Sulfa Antibiotics Other (See Comments)    G6PD         Medications Prior to Admission  Medication Sig Dispense Refill  . naproxen sodium (ALEVE) 220 MG tablet Take 220 mg by mouth 2 (two) times daily as needed (pain).      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[employer], was CNA Functional Status:  Mobility: Bed Mobility Overal bed mobility: Needs Assistance Bed Mobility: Rolling, Sidelying to Sit Rolling: Mod assist Sidelying to sit: Mod assist, +2 for safety/equipment, HOB elevated General bed mobility comments: use of bed pad to bring hips EOB, log rolling cues, assist for trunk elevation Transfers Overall transfer level: Needs assistance Equipment used: Rolling walker (2 wheeled) Transfers: Sit to/from Stand Sit to Stand: Mod assist, +2 safety/equipment Stand pivot transfers: Min assist, +2 safety/equipment General transfer comment: mod A +2 for safety with initial boost (vc for safe hand placement) min A +2 once up on feet Ambulation/Gait Ambulation/Gait assistance: Min assist Gait Distance (Feet): 5 Feet Assistive device: Rolling walker (2 wheeled) Gait Pattern/deviations: Step-to pattern, Step-through pattern, Decreased stride length, Narrow base of support, Scissoring, Shuffle, Decreased dorsiflexion - left, Decreased  dorsiflexion - right General Gait Details: Cues to self-monitor for activity tolerance; short, inefficient steps with dependence on RW for support Gait velocity: decreased Gait velocity interpretation: <1.8 ft/sec, indicate of risk for recurrent falls  ADL: ADL Overall ADL's : Needs assistance/impaired Eating/Feeding: Independent, Sitting Eating/Feeding Details (indicate cue type and reason): with increased time, says he cannot cut up food, but can use spoon and fork with increased time--prefers finger food right now. Did issue pt a rocker knife Grooming: Set up, Sitting Grooming Details (indicate cue type and reason): with increased time Upper Body Bathing: Set up, Supervision/ safety, Sitting Upper Body Bathing Details (indicate cue type and reason): with increased time Lower Body Bathing: Minimal assistance Lower Body Bathing Details (indicate cue type and reason): min A sit<>stand Upper Body Dressing : Minimal assistance, Sitting Upper Body Dressing Details (indicate cue type and reason): with increased time Lower Body Dressing: Moderate assistance Lower Body Dressing Details (indicate cue type and reason): min A sit<>stand Toilet Transfer: Moderate assistance, +2 for safety/equipment, BSC, RW Toilet Transfer Details (indicate cue type and reason): mod A +2 for safety with initial boost (vc for safe hand placement) min A +2 once up on feet Toileting- Clothing Manipulation and Hygiene: Maximal assistance, +2 for safety/equipment, Sit to/from stand Toileting - Clothing Manipulation Details (indicate cue type and reason): for rear peri care Functional mobility during ADLs: Minimal assistance, +2 for safety/equipment, Rolling walker General ADL Comments: reviewed cervical precautions with Pt  Cognition: Cognition Overall Cognitive Status: Within  Functional Limits for tasks assessed Orientation Level: Oriented X4 Cognition Arousal/Alertness: Awake/alert Behavior During Therapy: WFL  for tasks assessed/performed Overall Cognitive Status: Within Functional Limits for tasks assessed  Blood pressure 115/74, pulse (!) 45, temperature (!) 97.5 F (36.4 C), temperature source Oral, resp. rate 13, height 5\' 9"  (1.753 m), weight (!) 139.1 kg (306 lb 10.6 oz), SpO2 96 %. Physical Exam  Vitals reviewed. Constitutional: He is oriented to person, place, and time.  HENT:  Head: Normocephalic.  Eyes: EOM are normal.  Neck:  Cervical collar in place  Cardiovascular: Normal rate and regular rhythm.  Respiratory: Effort normal and breath sounds normal. No respiratory distress.  GI: Soft. Bowel sounds are normal. He exhibits no distension.  Neurological: He is alert and oriented to person, place, and time.  Skin: Skin is warm and dry.  Motor strength is 3- at the right deltoid biceps triceps grip 5/5 in the left deltoid bicep tricep grip 3- bilateral hip flexors knee extensors 4- ankle dorsiflexors Sensation intact light touch bilateral upper and lower limbs.   LabResultsLast24Hours  Results for orders placed or performed during the hospital encounter of 03/22/18 (from the past 24 hour(s))  Protime-INR     Status: Abnormal   Collection Time: 04/05/18  3:07 AM  Result Value Ref Range   Prothrombin Time 18.8 (H) 11.4 - 15.2 seconds   INR 1.58      ImagingResults(Last48hours)  No results found.     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 Does the need for close, 24 hr/day medical supervision in concert with the patient's rehab needs make it unreasonable for this patient to be served in a less intensive setting? Yes 2. Co-Morbidities requiring supervision/potential complications: Morbid obesity pulmonary embolism 3. Due to bladder management, bowel management, safety, skin/wound care, disease management, medication administration, pain management and  patient education, does the patient require 24 hr/day rehab nursing? Yes 4. Does the patient require coordinated care of a physician, rehab nurse, PT (1-2 hrs/day, 5 days/week) and OT (1-2 hrs/day, 5 days/week) to address physical and functional deficits in the context of the above medical diagnosis(es)? Yes Addressing deficits in the following areas: balance, endurance, locomotion, strength, transferring, bowel/bladder control, bathing, dressing, feeding, grooming, toileting and psychosocial support 5. Can the patient actively participate in an intensive therapy program of at least 3 hrs of therapy per day at least 5 days per week? Yes 6. The potential for patient to make measurable gains while on inpatient rehab is excellent 7. Anticipated functional outcomes upon discharge from inpatient rehab are modified independent and supervision  with PT, modified independent and supervision with OT, n/a with SLP. 8. Estimated rehab length of stay to reach the above functional goals is: 10 to 14 days 9. Anticipated D/C setting: Home 10. Anticipated post D/C treatments: HH therapy 11. Overall Rehab/Functional Prognosis: excellent  RECOMMENDATIONS: This patient's condition is appropriate for continued rehabilitative care in the following setting: CIR Patient has agreed to participate in recommended program. Yes Note that insurance prior authorization may be required for reimbursement for recommended care.  Comment: Need to address anticoagulation management prior to rehab "I have personally performed a face to face diagnostic evaluation of this patient.  Additionally, I have reviewed and concur with the physician assistant's documentation above." Erick Colace M.D. [facility] FAAPM&R (Sports Med, Neuromuscular Med) Diplomate Am Board of Electrodiagnostic Med  Lynnae Prude 04/05/2018  Revision History                   Routing History

## 2018-04-11 NOTE — Progress Notes (Signed)
PMR Admission Coordinator Pre-Admission Assessment  Patient: [name] [name] is an 52 y.o., male MRN: [id number] DOB: [birth year] Height: 5\' 9"  (175.3 cm) Weight: (!) 139.1 kg (306 lb 10.6 oz)                                                                                                                                                  Insurance Information HMO:     PPO: Yes     PCP:      IPA:      80/20:      OTHER:  PRIMARY: Designer, multimedia (Plan Type: [employer] ([employer]))      Policy#: [id number], Group A Field seismologist)      Subscriber: Patient CM Name: Approval reported by Darl Pikes (per Darl Pikes, if pt requires more than 14 day stay, pt will be assigned a CM to follow)     Phone#: [phone]     Fax#: [phone] Pre-Cert#: (pt's Worthy Rancher #: [id number]) Auth#: [id number] provided by Darl Pikes on 04/10/18      Employer:  Pt is approved for 14 days with update due 8/13 via fax  Benefits:  Phone #: NA     Name: Online Portal: [email or web address] Eff. Date: 09/13/16     Deduct: $0      Out of Pocket Max: (Catastrophic Cap: $1,000)      Life Max: NA CIR: $0, 100% coverage      SNF: $0, 100% coverage with referral  Outpatient: with referral     Co-Pay: $0/visit Home Health: $0, 100% coverage with referral      Co-Pay:  DME: $0, 100% coverage with referral     Co-Pay:  Providers:   Medicaid Application Date:       Case Manager:  Disability Application Date:       Case Worker:   Emergency Contact Information         Contact Information    Name Relation Home Work [place], [place] Mother   [phone]   Dameon, Soltis Father   [phone]   Hiroto, Saltzman   [phone]     Current Medical History  Patient Admitting Diagnosis: Cervical myelopathy with right upper extremity and bilateral lower extremity weakness OPLL status post C3-C5 ACDF  History of Present Illness: Larrie Arlineis a 52 y.o.right-handed malewith history of hypertension,  pernicious anemia, morbid obesity with BMI 45.29, gout as well as long-standing history of low back pain.Per chart review patient lives alone. Plans to stay with his parents on discharge and assistance as needed. One level home with 5 steps to entry.Presented 03/22/2018 with decrease in balance and numbness in his hand primarily on the right and dragging of his right leg. He had had a recent fall x2 where he fell and struck his head and neck. Patient also notes recent increase in constipation  as well as bouts of urinary retention. Cranial CT scan showed no acute intracranial process. MRI of thoracic lumbar spine showed severe cord compression from OPLLextending from C3-4 down to C4-5. Underwent anterior cervical corpectomy of C4, microscopic dissection and foraminotomies of the C4 and C5 nerve roots 03/24/2018 per Dr. Wynetta Emeryram. Cervical collar was placed postoperatively. Hospital course pain management. Rapid response 03/25/2018 after patient unresponsive after transferring from chair to bed and heart rate in the 30s. Patient initially hypoxic during event placed on NRB and responded well after a NS bolus of 250 cc. Patient again on 7/15 with increasing shortness of breath CT angiogram of the chest showed bilateral acute pulmonary emboli, occlusive within multiple segmental and subsegmental branches. Venous Doppler studies positive for acute DVT involving bilateral posterior tibial and peroneal veins. Placed on intravenous heparin. Echocardiogram with ejection fraction of 55% grade 2 diastolic dysfunction. Underwent placement of IVC filter per interventional radiology 03/28/2018 as well as reexploration of anterior cervical wound following initial heparin anticoagulation and no active bleeding noted. Follow-up oncology services in regards to anticoagulation in light of DVT/PE with work-up currently ongoing. Physical and occupational therapy evaluations completed with recommendations of physical  medicine rehab consult. Pt is to be admitted to CIR on 04/11/18    Past Medical History      Past Medical History:  Diagnosis Date  . Degenerative disc disease, lumbar   . Gout   . History of pernicious anemia 1980   Patient says he takes OTC iron supplements QD  . Hypertension     Family History  family history is not on file.  Prior Rehab/Hospitalizations:  Has the patient had major surgery during 100 days prior to admission? No  Current Medications   Current Facility-Administered Medications:  .  0.9 %  sodium chloride infusion (Manually program via Guardrails IV Fluids), , Intravenous, Once, Meyran, Tiana LoftKimberly Hannah, NP .  acetaminophen (TYLENOL) tablet 650 mg, 650 mg, Oral, Q4H PRN, 650 mg at 04/05/18 2002 **OR** acetaminophen (TYLENOL) suppository 650 mg, 650 mg, Rectal, Q4H PRN, Donalee Citrinram, Gary, MD .  alum & mag hydroxide-simeth (MAALOX/MYLANTA) 200-200-20 MG/5ML suspension 30 mL, 30 mL, Oral, Q6H PRN, Donalee Citrinram, Gary, MD, 30 mL at 04/02/18 1117 .  aspirin tablet 325 mg, 325 mg, Oral, Daily, Donalee Citrinram, Gary, MD, 325 mg at 04/10/18 1058 .  baclofen (LIORESAL) tablet 10 mg, 10 mg, Oral, BID, Donalee Citrinram, Gary, MD, 10 mg at 04/10/18 1058 .  [COMPLETED] 6 CHG cloth bath night before surgery, , , Once **AND** 6 CHG cloth bath AM of surgery, , , Once **AND** Chlorhexidine Gluconate Cloth 2 % PADS 6 each, 6 each, Topical, Once **AND** [COMPLETED] Chlorhexidine Gluconate Cloth 2 % PADS 6 each, 6 each, Topical, Once, Donalee Citrinram, Gary, MD, 6 each at 03/23/18 2124 .  cyclobenzaprine (FLEXERIL) tablet 10 mg, 10 mg, Oral, TID PRN, Donalee Citrinram, Gary, MD, 10 mg at 04/09/18 1802 .  dexamethasone (DECADRON) tablet 2 mg, 2 mg, Oral, Q8H, Donalee Citrinram, Gary, MD, 2 mg at 04/10/18 0616 .  docusate sodium (COLACE) capsule 100 mg, 100 mg, Oral, BID, Donalee Citrinram, Gary, MD, 100 mg at 04/08/18 2133 .  doxycycline (VIBRA-TABS) tablet 100 mg, 100 mg, Oral, Q12H, Donalee Citrinram, Gary, MD, 100 mg at 04/09/18 2134 .  enoxaparin (LOVENOX) injection 40 mg,  40 mg, Subcutaneous, Q24H, Johney MaineKale, Gautam Kishore, MD, 40 mg at 04/09/18 2134 .  furosemide (LASIX) tablet 20 mg, 20 mg, Oral, Daily, Johney MaineKale, Gautam Kishore, MD, 20 mg at 04/10/18 1058 .  HYDROmorphone (DILAUDID) injection 1  mg, 1 mg, Intravenous, Q2H PRN, Donalee Citrin, MD, 1 mg at 03/28/18 0649 .  lidocaine (XYLOCAINE) 1 % (with pres) injection, , , PRN, Gilmer Mor, DO, 10 mL at 03/28/18 1634 .  losartan (COZAAR) tablet 25 mg, 25 mg, Oral, Daily, Wendall Stade, MD, 25 mg at 04/09/18 1137 .  menthol-cetylpyridinium (CEPACOL) lozenge 3 mg, 1 lozenge, Oral, PRN, 3 mg at 03/27/18 0940 **OR** phenol (CHLORASEPTIC) mouth spray 1 spray, 1 spray, Mouth/Throat, PRN, Donalee Citrin, MD, 1 spray at 03/26/18 0140 .  ondansetron (ZOFRAN) tablet 4 mg, 4 mg, Oral, Q6H PRN, 4 mg at 04/04/18 1354 **OR** ondansetron (ZOFRAN) injection 4 mg, 4 mg, Intravenous, Q6H PRN, Donalee Citrin, MD, 4 mg at 03/26/18 0300 .  oxyCODONE (Oxy IR/ROXICODONE) immediate release tablet 10 mg, 10 mg, Oral, Q3H PRN, Donalee Citrin, MD, 10 mg at 04/10/18 0616 .  pantoprazole (PROTONIX) EC tablet 40 mg, 40 mg, Oral, Daily, Donalee Citrin, MD, 40 mg at 04/10/18 1058 .  zolpidem (AMBIEN) tablet 5 mg, 5 mg, Oral, QHS PRN, Barnett Abu, MD, 5 mg at 03/29/18 0038  Patients Current Diet:       Diet Order           Diet regular Room service appropriate? 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  Independent  Indoor Mobility: Did the patient need assistance with walking from room to room (with or without device)? Independent  Stairs: Did the patient need assistance with internal or external stairs (with or without device)? Independent  Functional Cognition: Did the patient need help planning regular tasks such as shopping or remembering to take medications? Independent  Current Functional Level Cognition  Overall Cognitive Status: Within Functional Limits for tasks assessed Orientation Level: Oriented X4    Extremity Assessment (includes Sensation/Coordination)  Upper Extremity Assessment: RUE deficits/detail RUE Deficits / Details: fatigues when pressing through on RW RUE Sensation: decreased light touch RUE Coordination: decreased fine motor LUE Deficits / Details: WNL LUE Sensation: WNL LUE Coordination: WNL  Lower Extremity Assessment: RLE deficits/detail, LLE deficits/detail RLE Deficits / Details: AAROM WFL, strength hip flexion 2+/4, knee extension 4-/5, ankle DF 4+/5; sensation decreased to temp and light touch throughout  RLE Sensation: decreased light touch LLE Deficits / Details: AROM WFL, strength hip flexion 4-/5, knee extension 5/5, ankle DF 5/5; sensation decreased to light touch, but feels temp LLE Sensation: decreased light touch    ADLs  Overall ADL's : Needs assistance/impaired Eating/Feeding: Independent, Sitting Eating/Feeding Details (indicate cue type and reason): with increased time, says he cannot cut up food, but can use spoon and fork with increased time--prefers finger food right now. Did issue pt a rocker knife Grooming:  Wash/dry  hands, Wash/dry face, Set up, Sitting Grooming Details (indicate cue type and reason): with increased time Upper Body Bathing: Set up, Supervision/ safety, Sitting Upper Body Bathing Details (indicate cue type and reason): with increased time Lower Body Bathing: Minimal assistance Lower Body Bathing Details (indicate cue type and reason): min A sit<>stand Upper Body Dressing : Minimal assistance, Sitting Upper Body Dressing Details (indicate cue type and reason): with increased time Lower Body Dressing: Moderate assistance Lower Body Dressing Details (indicate cue type and reason): min A sit<>stand Toilet Transfer: Minimal assistance, +2 for safety/equipment, Ambulation, RW, Grab bars, Comfort height toilet Toilet Transfer Details (indicate cue type and reason): into bathroom, cues for safety Toileting- Clothing Manipulation and Hygiene: Maximal assistance, Sit to/from stand Toileting - Clothing Manipulation Details (indicate cue type and reason): for rear peri care Functional mobility during ADLs: Minimal assistance, +2 for safety/equipment, Rolling walker General ADL Comments: reviewed cervical precautions with Pt    Mobility  Overal bed mobility: Needs Assistance Bed Mobility: Rolling, Sidelying to Sit Rolling: Min assist Sidelying to sit: Min assist General bed mobility comments: pt struggled and needed stability assist; cues for technique    Transfers  Overall transfer level: Needs assistance Equipment used: Rolling walker (2 wheeled) Transfers: Sit to/from Stand Sit to Stand: Min assist, +2 physical assistance Stand pivot transfers: Min assist, +2 safety/equipment General transfer comment: cues for hand placement    Ambulation / Gait / Stairs / Wheelchair Mobility  Ambulation/Gait Ambulation/Gait assistance: Min assist, +2 physical assistance Gait Distance (Feet): 130 Feet Assistive device: Rolling walker (2 wheeled) Gait Pattern/deviations: Step-through  pattern General Gait Details: heavy use of the RW.  Pt able to stand upright to cue today.  Unsteady at R knee.  Weak gastrocsoleus bil Gait velocity: decreased Gait velocity interpretation: <1.31 ft/sec, indicative of household ambulator    Posture / Balance Balance Overall balance assessment: Needs assistance Sitting-balance support: Feet supported Sitting balance-Leahy Scale: Good Standing balance support: Bilateral upper extremity supported Standing balance-Leahy Scale: Poor Standing balance comment: Reliance on bilateral UE support to maintain upright    Special needs/care consideration BiPAP/CPAP: no CPM: no Continuous Drip IV: No Dialysis:no        Days:NA Life Vest: No Oxygen: No Special Bed: No Trach Size: no Wound Vac (area): No      Location: no Skin: Surgical incision on anterior neck, closed incision on anterior groin, Left elbow skin tear from fall prior to admission; moisture associated skin damage (stage 2 bilateral buttocks breakdown) with therapy requesting geomat.                        Bowel mgmt:Last BM 04/10/18; typically continent, but episodes of incontinence recently Bladder mgmt: external catheter in place Diabetic mgmt: no *Therapy is requesting GeoMat in recliner due to discomfort in sitting and risk of skin breakdown      Previous Home Environment Living Arrangements: Parent, Alone(was living alone at extended stay, says he can stay with parents at discharge) Available Help at Discharge: Family Type of Home: House Home Layout: One level Home Access: Stairs to enter Entrance Stairs-Rails: Right Entrance Stairs-Number of Steps: 5 in front, 3 in garage Bathroom Shower/Tub: Engineer, manufacturing systems: Standard Home Care Services: No  Discharge Living Setting Plans for Discharge Living Setting: Other (Comment)(pt plans to DC to his parents house in [place]) Type of Home at Discharge: House Discharge Home Layout: One level Discharge  Home Access: Stairs to enter Entrance  Stairs-Rails: Can reach both Entrance Stairs-Number of Steps: 5 Discharge Bathroom Shower/Tub: Tub/shower unit Discharge Bathroom Toilet: Handicapped height Discharge Bathroom Accessibility: Yes How Accessible: Accessible via walker Does the patient have any problems obtaining your medications?: No  Social/Family/Support Systems Patient Roles: Parent, Other (Comment)(going through divorce; has 7yo son; works Teacher, 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) Solicitor Information: his parents are his emergency contact Anticipated Caregiver: parents: Harden Mo (mother) [phone]; father Gerlene Burdock) [phone] Anticipated Caregiver's Contact Information: see above for his parents  Ability/Limitations of Caregiver: Parents are able to provide physical assistance as needed Caregiver Availability: 24/7 Discharge Plan Discussed with Primary Caregiver: Yes Is Caregiver In Agreement with Plan?: Yes Does Caregiver/Family have Issues with Lodging/Transportation while Pt is in Rehab?: No   Goals/Additional Needs Patient/Family Goal for Rehab: PT/OT: Mod I/Supervision; SLP: NA Expected length of stay: 10-14 days Cultural Considerations: NA Dietary Needs: Diet Regular, thin liquinds Equipment Needs: TBD Pt/Family Agrees to Admission and willing to participate: Yes Program Orientation Provided & Reviewed with Pt/Caregiver Including Roles  & Responsibilities: Yes(reviewed with pt and his parents)  Barriers to Discharge: Other (comments)(will be staying with his parents in their house at DC)   Decrease burden of Care through IP rehab admission: NA   Possible need for SNF placement upon discharge:Not anticipated as pt has good family support and excellent prognosis for progress.    Patient Condition: This patient's medical and functional status has changed since the consult dated: 04/04/18 in which the Rehabilitation Physician determined and documented that the patient's condition is  appropriate for intensive rehabilitative care in an inpatient rehabilitation facility. Medical changes are: draining from surgical site with changes in medications as well as hematology/oncology following up for coagulation needs.  Functional changes are: Min A x2 for 36 feet and Min A x2 for sit to stand transfers. After evaluating the patient today and speaking with the Rehabilitation physician and acute team, the patient remains appropriate for inpatient rehab. Will admit to inpatient rehab today.  Preadmission Screen Completed By:  Nanine Means, 04/10/2018 1:12 PM ______________________________________________________________________   Discussed status with Dr. Riley Kill  on 04/11/18 at 2:15PM and received telephone approval for admission today.  Admission Coordinator:  Nanine Means, time 2:15PM Dorna Bloom 04/11/18             Cosigned by: Ranelle Oyster, MD at 04/11/2018 2:59 PM  Revision History

## 2018-04-12 ENCOUNTER — Inpatient Hospital Stay (HOSPITAL_COMMUNITY): Admitting: Physical Therapy

## 2018-04-12 ENCOUNTER — Inpatient Hospital Stay (HOSPITAL_COMMUNITY): Admitting: Occupational Therapy

## 2018-04-12 DIAGNOSIS — I2699 Other pulmonary embolism without acute cor pulmonale: Secondary | ICD-10-CM

## 2018-04-12 DIAGNOSIS — I10 Essential (primary) hypertension: Secondary | ICD-10-CM

## 2018-04-12 DIAGNOSIS — R601 Generalized edema: Secondary | ICD-10-CM

## 2018-04-12 DIAGNOSIS — D62 Acute posthemorrhagic anemia: Secondary | ICD-10-CM

## 2018-04-12 DIAGNOSIS — D696 Thrombocytopenia, unspecified: Secondary | ICD-10-CM

## 2018-04-12 DIAGNOSIS — I824Y3 Acute embolism and thrombosis of unspecified deep veins of proximal lower extremity, bilateral: Secondary | ICD-10-CM

## 2018-04-12 LAB — CBC WITH DIFFERENTIAL/PLATELET
Abs Immature Granulocytes: 0 10*3/uL (ref 0.0–0.1)
Basophils Absolute: 0 10*3/uL (ref 0.0–0.1)
Basophils Relative: 0 %
Eosinophils Absolute: 0 10*3/uL (ref 0.0–0.7)
Eosinophils Relative: 0 %
HCT: 33.2 % — ABNORMAL LOW (ref 39.0–52.0)
Hemoglobin: 10.4 g/dL — ABNORMAL LOW (ref 13.0–17.0)
IMMATURE GRANULOCYTES: 0 %
Lymphocytes Relative: 13 %
Lymphs Abs: 1.1 10*3/uL (ref 0.7–4.0)
MCH: 29.4 pg (ref 26.0–34.0)
MCHC: 31.3 g/dL (ref 30.0–36.0)
MCV: 93.8 fL (ref 78.0–100.0)
Monocytes Absolute: 0.5 10*3/uL (ref 0.1–1.0)
Monocytes Relative: 6 %
NEUTROS ABS: 6.8 10*3/uL (ref 1.7–7.7)
NEUTROS PCT: 81 %
PLATELETS: 98 10*3/uL — AB (ref 150–400)
RBC: 3.54 MIL/uL — ABNORMAL LOW (ref 4.22–5.81)
RDW: 13.7 % (ref 11.5–15.5)
WBC: 8.5 10*3/uL (ref 4.0–10.5)

## 2018-04-12 LAB — COMPREHENSIVE METABOLIC PANEL
ALT: 21 U/L (ref 0–44)
AST: 21 U/L (ref 15–41)
Albumin: 2.6 g/dL — ABNORMAL LOW (ref 3.5–5.0)
Alkaline Phosphatase: 49 U/L (ref 38–126)
Anion gap: 7 (ref 5–15)
BUN: 26 mg/dL — ABNORMAL HIGH (ref 6–20)
CO2: 29 mmol/L (ref 22–32)
Calcium: 8.8 mg/dL — ABNORMAL LOW (ref 8.9–10.3)
Chloride: 105 mmol/L (ref 98–111)
Creatinine, Ser: 1.15 mg/dL (ref 0.61–1.24)
GFR calc Af Amer: >60 mL/min (ref >60)
GFR calc non Af Amer: >60 mL/min (ref >60)
GLUCOSE: 127 mg/dL — AB (ref 70–99)
POTASSIUM: 4.6 mmol/L (ref 3.5–5.1)
Sodium: 141 mmol/L (ref 135–145)
Total Bilirubin: 0.8 mg/dL (ref 0.3–1.2)
Total Protein: 5.2 g/dL — ABNORMAL LOW (ref 6.5–8.1)

## 2018-04-12 NOTE — Evaluation (Signed)
Physical Therapy Assessment and Plan  Patient Details  Name: [name] [name] MRN: [id number] Date of Birth: 06-18-1966  PT Diagnosis: Abnormal posture, Abnormality of gait, Difficulty walking, Impaired sensation, Muscle weakness and Quadriplegia Rehab Potential: Good ELOS: 12-15 days    Today's Date: 04/13/2018 PT Individual Time: 805-915 and 1300-1400    70 min and 60 min   Problem List:  Patient Active Problem List   Diagnosis Date Noted  . Cervical myelopathy ([place]) 04/11/2018  . Pressure injury of skin 04/10/2018  . Acute deep vein thrombosis (DVT) of lower extremity ([place])   . Bleeding   . Pulmonary embolism ([facility])bilateral, failed heparin IVC filter placed7/16 03/29/2018  . Preop cardiovascular exam   . Acute deep vein thrombosis (DVT) of right lower extremity ([place])   . Myelopathy ([place]) 03/22/2018    Past Medical History:  Past Medical History:  Diagnosis Date  . Degenerative disc disease, lumbar   . Gout   . History of pernicious anemia 1980   Patient says he takes OTC iron supplements QD  . Hypertension    Past Surgical History:  Past Surgical History:  Procedure Laterality Date  . ANTERIOR CERVICAL CORPECTOMY N/A 03/24/2018   Procedure: CERVICAL FOUR ANTERIOR CERVICAL CORPECTOMY;  Surgeon: Kary Kos, MD;  Location: [place];  Service: Neurosurgery;  Laterality: N/A;  . ANTERIOR CERVICAL DECOMP/DISCECTOMY FUSION N/A 03/28/2018   Procedure: Reexploration of anterior cervical wound;  Surgeon: Kary Kos, MD;  Location: [place];  Service: Neurosurgery;  Laterality: N/A;  . IR IVC FILTER PLMT / S&I Burke Keels GUID/MOD SED  03/28/2018  . MENISCUS REPAIR Left   . TENDON REPAIR     tricept tendon    Assessment & Plan Clinical Impression: Patient is a 52 year old right-handed male with history of hypertension, pernicious anemia, morbid obesity with BMI 45.29, gout as well as long-standing history of low back pain. Per chart review patient lives alone. Plans to stay with his parents  on discharge and assistance as needed. One level home with 5 steps to entry. Presented 03/22/2018 with decrease in balance and numbness in his hand primarily on the right dragging of his right leg. He had a recent fall x2 where he fell struck his head neck. Patient also notes recent increase in constipation as well as bouts of urinary retention. Cranial CT scan showed no acute intracranial process. MRI of the thoracic lumbar spine showed severe cord compression extending from C3-4 down to C4-5. Underwent anterior cervical corpectomy of C4, microscopic dissection and foraminotomies of the C4 and C5 nerve root 03/24/2018 per Dr. Saintclair Halsted. Cervical collar placed postoperatively. Hospital course pain management. Rapid response 03/25/2018 after patient unresponsive after transferring from chair to bed heart rate in the 30s. Initially hypoxic during event placed on NRB and responded well after NS bolus of 250 cc. Patient again on 715 with increasing shortness of breath CT angiogram of the chest showed bilateral acute pulmonary emboli, occlusive within multiple segmental and subsegmental branches. Venous Dopplers positive for acute DVT involving bilateral posterior tibial and peroneal veins. Venous Dopplers upper extremities negative. Initially placed on intravenous heparin and transitioned to Coumadin. Cardiology services consulted for bradycardia. EKG sinus rhythm. Echocardiogram with ejection fraction of 65% grade 2 diastolic dysfunction. Underwent placement of IVC filter per interventional radiology 03/28/2018 as well as reexploration of anterior cervical wound following initial heparin anticoagulation for active oozing from site. Patient's heparin and Coumadin were discontinued due to sensitivity of anticoagulation and did receive vitamin K. Initial attempts to transition to Lovenox  however increased oozing again from the site and Lovenox later discontinued. Follow-up oncology hematology services  04/07/2018 and Lovenox conservatively again initiated 40 mg daily. Plan was for Lovenox 40 mg daily x48 hours then changed to 60 mg every 12 hours x1 week and then again if no bleeding transition to Eliquis 5 mg twice daily. Close monitoring of hemoglobin hematocrit.  Patient transferred to CIR on 04/11/2018 .   Patient currently requires mod with mobility secondary to muscle weakness and muscle joint tightness, decreased cardiorespiratoy endurance, unbalanced muscle activation and decreased sitting balance, decreased standing balance, decreased balance strategies and difficulty maintaining precautions.  Prior to hospitalization, patient was independent  with mobility and lived with Family(discharging to his parents house) in a House home.  Home access is 5 .  Patient will benefit from skilled PT intervention to maximize safe functional mobility, minimize fall risk and decrease caregiver burden for planned discharge home with 24 hour supervision.  Anticipate patient will benefit from follow up [place] at discharge.  PT Assessment Rehab Potential (ACUTE/IP ONLY): Good PT Barriers to Discharge: Inaccessible home environment;Decreased caregiver support;Medical stability;Home environment access/layout PT Patient demonstrates impairments in the following area(s): Balance;Edema;Endurance;Motor;Pain;Safety;Sensory;Skin Integrity PT Transfers Functional Problem(s): Bed Mobility;Bed to Chair;Car;Furniture;Floor PT Locomotion Functional Problem(s): Ambulation;Wheelchair Mobility;Stairs PT Plan PT Intensity: Minimum of 1-2 x/day ,45 to 90 minutes PT Frequency: 5 out of 7 days PT Duration Estimated Length of Stay: 12-15 days  PT Treatment/Interventions: Ambulation/gait training;Balance/vestibular training;Community reintegration;Discharge planning;Disease management/prevention;DME/adaptive equipment instruction;Functional mobility training;Functional electrical stimulation;Patient/family education;Neuromuscular  re-education;Pain management;Psychosocial support;Skin care/wound management;Splinting/orthotics;Therapeutic Exercise;Therapeutic Activities;Stair training;UE/LE Coordination activities;UE/LE Strength taining/ROM;Visual/perceptual remediation/compensation;Wheelchair propulsion/positioning PT Transfers Anticipated Outcome(s): Supervision assist with LRAD  PT Locomotion Anticipated Outcome(s): ambulatory at household level with supervision assist  PT Recommendation Recommendations for Other Services: Neuropsych consult;Therapeutic Recreation consult Therapeutic Recreation Interventions: Stress management;Outing/community reintergration;Kitchen group Follow Up Recommendations: Home health PT Patient destination: Home Equipment Recommended: Wheelchair cushion (measurements);Wheelchair (measurements);Rolling walker with 5" wheels  Skilled Therapeutic Intervention Session 1  PT instructed patient in PT Evaluation and initiated treatment intervention; see below for results. PT educated patient in [place], rehab potential, rehab goals, and discharge recommendations.    Session 2. Pt received supine in bed and agreeable to PT. Supine>sit transfer with supervision assist and min cues   Stand pivot transfer to [facility] with min assist from PT from elevated bed height.   WC mobility x 175f with supervision assist from PT with min cued for improved use of the RUE to prevent hitting rail of wall at end of WC mobility.   Car transfer training instructed by PT with mod assist to control BLE into nad out of car due to hip flexor weakness and edema.   Gait training with RW 2 x 548fwith min assist and min cues for safety, and gait pattern. Pt noted to have intermittent R knee instability but no additional assistance need to prevent falls.   Pt returned to room and performed stand pivot transfer to bed with min assist. Sit>supine completed with min assist at the RLE, and left supine in bed with call bell in reach and  all needs met.        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[employer], was CNA Vision/Perception    [facility] Cognition   [facility] Sensation Sensation Light Touch: Impaired Detail Light Touch Impaired Details: Impaired RLE Additional Comments: decreased appreciation to light touch testing R>L, reports N?T in BLE Coordination Gross Motor Movements are Fluid and Coordinated: No Fine Motor Movements are Fluid and Coordinated: No Motor  Motor Motor: Tetraplegia Motor - Skilled Clinical Observations: BUE and LE N/T with weakness Bil R>L  Mobility Bed Mobility Bed Mobility: Rolling Right;Rolling Left;Supine to Sit;Sit to Supine Rolling Right: Supervision/verbal cueing Rolling Left: Supervision/Verbal cueing Supine to Sit: Minimal Assistance - Patient > 75% Sit to Supine: Minimal Assistance - Patient > 75% Transfers Transfers: Sit to Stand;Stand to Sit;Stand Pivot Transfers Sit to Stand: Minimal Assistance - Patient > 75% Stand to Sit: Minimal Assistance - Patient > 75% Stand Pivot Transfers: Minimal Assistance - Patient > 75% Transfer (Assistive device): Rolling walker Locomotion  Gait Ambulation: Yes Gait Assistance: Minimal Assistance - Patient > 75% Gait Distance (Feet): 35 Feet Assistive device: Rolling walker Gait Gait: Yes Gait Pattern: Poor foot clearance - right;Wide base of support Stairs / Additional Locomotion Stairs: Yes Stairs Assistance: Minimal  Assistance - Patient > 75% Stair Management Technique: Two rails Number of Stairs: 4 Height of Stairs: 3 Wheelchair Mobility Wheelchair Mobility: Yes Wheelchair Assistance: Minimal assistance - Patient >75% Wheelchair Propulsion: Both upper extremities Wheelchair Parts Management: Needs assistance Distance: 100  Trunk/Postural Assessment  Cervical Assessment Cervical Assessment: Exceptions to WFL(cervical collar in place) Thoracic Assessment Thoracic Assessment: Exceptions to WFL(rounded shoulders) Lumbar Assessment Lumbar Assessment: Exceptions to WFL(slight posterior pelvic tilt)  Balance Balance Balance Assessed: Yes Static Sitting Balance Static Sitting - Balance Support: No upper extremity supported Static Sitting - Level of Assistance: 5: Stand by assistance Dynamic Sitting Balance Dynamic Sitting - Balance Support: During functional activity Dynamic Sitting - Level of Assistance: 4: Min assist(LOB posteriorly X1 requiring use of his UEs to regain his balance.  ) Static Standing Balance Static Standing - Balance Support: During functional activity Static Standing - Level of Assistance: 4: Min assist Dynamic Standing Balance Dynamic Standing - Balance Support: During functional activity Dynamic Standing - Level of Assistance: 3: Mod assist  Extremity Assessment      RLE Assessment RLE Assessment: Exceptions to [facility] General Strength Comments: groslly 4-/5 proximal to distal LLE Assessment LLE Assessment: Exceptions to [facility] General Strength Comments: grossly 4/5 proximal to distal   See Function Navigator for Current Functional Status.   Refer to Care Plan for Long Term Goals  Recommendations for other services: Neuropsych and Therapeutic Recreation  Kitchen group, Stress management and Outing/community reintegration  Discharge Criteria: Patient will be discharged from PT if patient refuses treatment 3 consecutive times without medical reason, if treatment goals not  met, if there is a change in medical status, if patient makes no progress towards goals or if patient is discharged from hospital.  The above assessment, treatment plan, treatment alternatives and goals were discussed and mutually agreed upon: by patient  Lorie Phenix 04/12/2018, 9:09 AM

## 2018-04-12 NOTE — Progress Notes (Addendum)
[place] PHYSICAL MEDICINE & REHABILITATION     PROGRESS NOTE  Subjective/Complaints:  Patient seen sitting up in bed this morning. He states he slept fairly overnight. He states he is eager to begin therapies today.  ROS: deny CP, SOB, nausea vomiting, diarrhea.  Objective: Vital Signs: Blood pressure 124/77, pulse 60, temperature (!) 97.4 F (36.3 C), temperature source Oral, resp. rate 18, weight (!) 139.2 kg (306 lb 14.1 oz), SpO2 100 %. No results found. Recent Labs    04/11/18 1814 04/12/18 0550  WBC 9.6 8.5  HGB 11.8* 10.4*  HCT 39.3 33.2*  PLT 110* 98*   Recent Labs    04/11/18 1814  CREATININE 1.14   CBG (last 3)  No results for input(s): GLUCAP in the last 72 hours.  Wt Readings from Last 3 Encounters:  04/12/18 (!) 139.2 kg (306 lb 14.1 oz)  03/23/18 (!) 139.1 kg (306 lb 10.6 oz)    Physical Exam:  BP 124/77 (BP Location: Left Arm)   Pulse 60   Temp (!) 97.4 F (36.3 C) (Oral)   Resp 18   Wt (!) 139.2 kg (306 lb 14.1 oz)   SpO2 100%   BMI 45.32 kg/m  Constitutional: He appearswell-developed.No distress. Obese. HENT: Normocephalicand atraumatic.  Eyes:EOMI. No discharge.  Neck: Cervical collar, wound intact Cardiovascular:Normal rateand regular rhythm. No JVD. Respiratory:Effort normal. Clear.  GI: He exhibitsno distension. Bowel sounds normal. Musculoskeletal: He exhibitsedema(bilateral lower extremities)and tenderness.  Neurological: He isalertand oriented  Motor:  RUE: 4 -/5 prox to distal.  LUE: 4+/5 proximal to distal RLE: 2/5 hip flexion, 3+/5 knee extension, 5/5 ADF LLE: 3/5 hip flexion, 4 -/5 knee extension, 5/5 ankle dorsiflexion Sensation diminished to light touch right hand and foot. Skin:Incision site c/d/i  Assessment/Plan: 1. Functional deficits secondary to cervical myelopathy with quadriparesis which require 3+ hours per day of interdisciplinary therapy in a comprehensive inpatient rehab setting. Physiatrist is  providing close team supervision and 24 hour management of active medical problems listed below. Physiatrist and rehab team continue to assess barriers to discharge/monitor patient progress toward functional and medical goals.  Function:  Bathing Bathing position      Bathing parts      Bathing assist        Upper Body Dressing/Undressing Upper body dressing                    Upper body assist        Lower Body Dressing/Undressing Lower body dressing                                  Lower body assist        Toileting Toileting Toileting activity did not occur: No continent bowel/bladder event Toileting steps completed by patient: Performs perineal hygiene Toileting steps completed by helper: Adjust clothing prior to toileting, Adjust clothing after toileting    Toileting assist Assist level: Touching or steadying assistance (Pt.75%)   Transfers Chair/bed transfer             Locomotion Ambulation           Wheelchair          Cognition Comprehension Comprehension assist level: Understands basic 90% of the time/cues < 10% of the time  Expression Expression assist level: Expresses basic 90% of the time/requires cueing < 10% of the time.  Social Interaction Social Interaction assist level: Interacts appropriately 90% of  the time - Needs monitoring or encouragement for participation or interaction.  Problem Solving Problem solving assist level: Solves basic 90% of the time/requires cueing < 10% of the time  Memory Memory assist level: Complete Independence: No helper    Medical Problem List and Plan: 1.Right upper extremity weakness and bilateral lower extremity weakness secondary to OPLL/ cervical stenosis with resultant cervical myelopathy status post C3-C5 ACDF 03/24/2018. Cervical collar as directed  Begin CIR  Notes reviewed - cervical fusion with post- bleeding, labs reviewed 2. DVT Prophylaxis/Anticoagulation/bilateral pulmonary  emboli/bilateral posterior tibial peroneal vein WJX:[id number]:Lovenox 60 mg every 12 hours x1 week then begin Eliquis 5 mg twice daily if no bleeding.  3. Pain Management:Baclofen 10 mg twice daily, Flexeril and oxycodone as needed 4. Mood:Provide emotional support 5. Neuropsych: This patientiscapable of making decisions on hisown behalf. 6. Skin/Wound Care:Routine skin checks 7. Fluids/Electrolytes/Nutrition:Routine in and outs  BMP pending 8.Neurogenic bowel and bladder. Established bowel program. 9.Hypertension. Cozaar 25 mg daily.   Monitor with increased mobility 10.Morbid obesity. BMI 45.29. Dietary follow-up 11.History of pernicious anemia with ABLA  Hemoglobin 10.4 on 7/31  Labs ordered for tomorrow 12. Thrombocytopenia  Platelets 98 on 7/31  Labs ordered for tomorrow  Will discuss with lab/pharmacy for workup for Lovenox induced thrombocytopenia  Hemoccult ordered  LOS (Days) 1 A FACE TO FACE EVALUATION WAS PERFORMED  Yusra Ravert Karis Jubanil Nashonda Limberg 04/12/2018 8:56 AM

## 2018-04-12 NOTE — Progress Notes (Signed)
Refusing Colace, compliant with all other meds

## 2018-04-12 NOTE — Evaluation (Signed)
Occupational Therapy Assessment and Plan  Patient Details  Name: [name] [name] MRN: [id number] Date of Birth: November 10, 1965  OT Diagnosis: muscle weakness (generalized) Rehab Potential: Rehab Potential (ACUTE ONLY): Excellent ELOS: 12-14 days   Today's Date: 04/12/2018 OT Individual Time: 2595-6387 OT Individual Time Calculation (min): 58 min     Problem List:  Patient Active Problem List   Diagnosis Date Noted  . Anasarca   . Thrombocytopenia ([place])   . Morbid obesity ([place])   . Acute blood loss anemia   . Benign essential HTN   . Cervical myelopathy ([place]) 04/11/2018  . Pressure injury of skin 04/10/2018  . Acute deep vein thrombosis (DVT) of lower extremity ([place])   . Bleeding   . Pulmonary embolism ([facility])bilateral, failed heparin IVC filter placed7/16 03/29/2018  . Preop cardiovascular exam   . Acute deep vein thrombosis (DVT) of right lower extremity ([place])   . Myelopathy ([place]) 03/22/2018    Past Medical History:  Past Medical History:  Diagnosis Date  . Degenerative disc disease, lumbar   . Gout   . History of pernicious anemia 1980   Patient says he takes OTC iron supplements QD  . Hypertension    Past Surgical History:  Past Surgical History:  Procedure Laterality Date  . ANTERIOR CERVICAL CORPECTOMY N/A 03/24/2018   Procedure: CERVICAL FOUR ANTERIOR CERVICAL CORPECTOMY;  Surgeon: Kary Kos, MD;  Location: [facility];  Service: Neurosurgery;  Laterality: N/A;  . ANTERIOR CERVICAL DECOMP/DISCECTOMY FUSION N/A 03/28/2018   Procedure: Reexploration of anterior cervical wound;  Surgeon: Kary Kos, MD;  Location: [facility];  Service: Neurosurgery;  Laterality: N/A;  . IR IVC FILTER PLMT / S&I Burke Keels GUID/MOD SED  03/28/2018  . MENISCUS REPAIR Left   . TENDON REPAIR     tricept tendon    Assessment & Plan Clinical Impression: Patient is a 52 y.o. year old male with recent admission to the hospital on 03/22/2018 with decrease in balance and numbness in his hand primarily on the  right dragging of his right leg. He had a recent fall x2 where he fell struck his head neck. Patient also notes recent increase in constipation as well as bouts of urinary retention. Cranial CT scan showed no acute intracranial process. MRI of the thoracic lumbar spine showed severe cord compression extending from C3-4 down to C4-5. Underwent anterior cervical corpectomy of C4, microscopic dissection and foraminotomies of the C4 and C5 nerve root 03/24/2018 per Dr. Saintclair Halsted. Cervical collar placed postoperatively. Hospital course pain management. Rapid response 03/25/2018 after patient unresponsive after transferring from chair to bed heart rate in the 30s. Initially hypoxic during event placed on NRB and responded well after NS bolus of 250 cc. Patient again on 715 with increasing shortness of breath CT angiogram of the chest showed bilateral acute pulmonary emboli, occlusive within multiple segmental and subsegmental branches. Venous Dopplers positive for acute DVT involving bilateral posterior tibial and peroneal veins. Venous Dopplers upper extremities negative. Initially placed on intravenous heparin and transitioned to Coumadin. Cardiology services consulted for bradycardia. EKG sinus rhythm. Echocardiogram with ejection fraction of 56% grade 2 diastolic dysfunction. Underwent placement of IVC filter per interventional radiology 03/28/2018 as well as reexploration of anterior cervical wound following initial heparin anticoagulation for active oozing from site. Patient transferred to CIR on 04/11/2018 .    Patient currently requires max with basic self-care skills secondary to muscle weakness, unbalanced muscle activation and decreased motor planning and decreased sitting balance, decreased standing balance and decreased balance strategies.  Prior to hospitalization, patient could complete ADLs with independent .  Patient will benefit from skilled intervention to decrease level of assist with  basic self-care skills and increase independence with basic self-care skills prior to discharge home with care partner.  Anticipate patient will require 24 hour supervision and follow up home health.  OT - End of Session Activity Tolerance: Tolerates 30+ min activity with multiple rests Endurance Deficit: Yes OT Assessment Rehab Potential (ACUTE ONLY): Excellent OT Patient demonstrates impairments in the following area(s): Balance;Endurance;Motor;Sensory OT Basic ADL's Functional Problem(s): Eating;Grooming;Bathing;Dressing;Toileting OT Advanced ADL's Functional Problem(s): Simple Meal Preparation OT Transfers Functional Problem(s): Tub/Shower OT Additional Impairment(s): Fuctional Use of Upper Extremity OT Plan OT Intensity: Minimum of 1-2 x/day, 45 to 90 minutes OT Frequency: 5 out of 7 days OT Duration/Estimated Length of Stay: 12-14 days OT Treatment/Interventions: Teacher, English as a foreign language;Discharge planning;Pain management;Self Care/advanced ADL retraining;Therapeutic Activities;UE/LE Coordination activities;Patient/family education;Functional mobility training;Neuromuscular re-education;Therapeutic Exercise;UE/LE Strength taining/ROM;Wheelchair propulsion/positioning;Community reintegration OT Self Feeding Anticipated Outcome(s): modified independent OT Basic Self-Care Anticipated Outcome(s): supervision OT Toileting Anticipated Outcome(s): supervision OT Bathroom Transfers Anticipated Outcome(s): supervision OT Recommendation Patient destination: Home Follow Up Recommendations: 24 hour supervision/assistance Equipment Recommended: Tub/shower bench   Skilled Therapeutic Intervention Pt completed selfcare retraining sit to stand on the EOB.  Decreased ability for pt to wash the left upper arm or shoulder secondary to decreased active adduction in the right arm.  He also needed mod assist with washing under the left under arm as well as for  applying deodorant.  He needed assistance with reaching his lower legs as well with decreased ability to remove gripper socks or wash feet this session.  Mod assist for sit to stand from the EOB with mod demonstrational cueing for hand placement with sit to stand.  One LOB posteriorly noted when attempting to donn shirt, which pt was able to self correct with use of the LUE for support.  Decreased ability to donn pullover shirt over his, head, requiring mod assist from therapist to complete.  Pt left in bed at end of session with call button and phone in reach.  Discussed expectations of approximately two week LOS.   OT Evaluation Precautions/Restrictions  Precautions Precautions: Fall Required Braces or Orthoses: Cervical Brace Restrictions Other Position/Activity Restrictions: cervical collar can be donned in sitting, removed for showering, and removed while in bed per orders   Pain Pain Assessment Pain Scale: 0-10 Pain Score: 0-No pain Home Living/Prior Functioning Home Living Living Arrangements: Parent Available Help at Discharge: Family Type of Home: House Home Access: Stairs to enter Technical brewer of Steps: 5 Home Layout: One level Bathroom Shower/Tub: Chiropodist: Standard  Lives With: Family(discharging to his parents house) IADL History Current License: Yes Occupation: Full time employment Type of Occupation: 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[employer], was CNA ADL  See Function Section of chart for details  Vision Baseline Vision/History: No visual deficits Patient Visual Report: No change from baseline Vision Assessment?: No apparent visual deficits Perception  Perception: Within Functional Limits Praxis Praxis: Intact Cognition Overall Cognitive Status: Within  Functional Limits for tasks assessed Arousal/Alertness: Awake/alert Orientation Level: Person;Place;Situation Person: Oriented Situation: Oriented Year: 2019 Month: July Day of Week: Correct Memory: Appears intact Immediate Memory Recall: Sock;Blue;Bed Memory Recall: Bed;Blue;Sock Memory Recall Sock: Without Cue Memory Recall Blue: Without Cue Memory  Recall Bed: Without Cue Attention: Sustained Sustained Attention: Appears intact Awareness: Appears intact Problem Solving: Appears intact Safety/Judgment: Appears intact Sensation Sensation Light Touch: Impaired Detail Central sensation comments: Pt with spinal cord compression Light Touch Impaired Details: Impaired RUE Additional Comments: Pt with decreased light touch in the fingers bilaterally, more impaired in the RUE compared to the left, but able to detect increased pressure. Coordination Gross Motor Movements are Fluid and Coordinated: No Fine Motor Movements are Fluid and Coordinated: No Coordination and Movement Description: Decreased FM coordination in the RUE with pt reporting decreased ability to write with a pen or pencil when attempting.   Motor  Motor Motor: Tetraplegia Motor - Skilled Clinical Observations: BUE and LE numbness and tingling with weakness Bil R>L.   Mobility  Bed Mobility Bed Mobility: Rolling Right;Rolling Left;Supine to Sit;Sit to Supine Rolling Right: Supervision/verbal cueing Rolling Left: Minimal Assistance - Patient > 75% Supine to Sit: Minimal Assistance - Patient > 75% Sit to Supine: Minimal Assistance - Patient > 75% Transfers Sit to Stand: Moderate Assistance - Patient 50-74% Stand to Sit: Minimal Assistance - Patient > 75%  Trunk/Postural Assessment  Cervical Assessment Cervical Assessment: Exceptions to WFL(cervical collar in place) Thoracic Assessment Thoracic Assessment: Exceptions to WFL(rounded shoulders) Lumbar Assessment Lumbar Assessment: Exceptions to WFL(slight  posterior pelvic tilt)  Balance Balance Balance Assessed: Yes Static Sitting Balance Static Sitting - Balance Support: No upper extremity supported Static Sitting - Level of Assistance: 5: Stand by assistance Dynamic Sitting Balance Dynamic Sitting - Balance Support: During functional activity Dynamic Sitting - Level of Assistance: 4: Min assist(LOB posteriorly X1 requiring use of his UEs to regain his balance.  ) Static Standing Balance Static Standing - Balance Support: During functional activity Static Standing - Level of Assistance: 4: Min assist Dynamic Standing Balance Dynamic Standing - Balance Support: During functional activity Dynamic Standing - Level of Assistance: 3: Mod assist Extremity/Trunk Assessment RUE Assessment RUE Assessment: Exceptions to [facility] Active Range of Motion (AROM) Comments: Shoulder flexion 0-70 degrees AROM, AAROM 0-110 with shoulder pain.  AROM all other digits WFLS,  wrist strength 4/5, grip strength 3/5 LUE Assessment LUE Assessment: Exceptions to [facility] Active Range of Motion (AROM) Comments: AROM WFLS for all joints,  grip strength 4/5 throughout   See Function Navigator for Current Functional Status.   Refer to Care Plan for Long Term Goals  Recommendations for other services: None    Discharge Criteria: Patient will be discharged from OT if patient refuses treatment 3 consecutive times without medical reason, if treatment goals not met, if there is a change in medical status, if patient makes no progress towards goals or if patient is discharged from hospital.  The above assessment, treatment plan, treatment alternatives and goals were discussed and mutually agreed upon: by patient  Reon Hunley OTR/L 04/12/2018, 1:16 PM

## 2018-04-13 ENCOUNTER — Inpatient Hospital Stay (HOSPITAL_COMMUNITY): Admitting: Occupational Therapy

## 2018-04-13 ENCOUNTER — Inpatient Hospital Stay (HOSPITAL_COMMUNITY): Admitting: Physical Therapy

## 2018-04-13 DIAGNOSIS — G479 Sleep disorder, unspecified: Secondary | ICD-10-CM

## 2018-04-13 DIAGNOSIS — F4310 Post-traumatic stress disorder, unspecified: Secondary | ICD-10-CM

## 2018-04-13 DIAGNOSIS — E8809 Other disorders of plasma-protein metabolism, not elsewhere classified: Secondary | ICD-10-CM

## 2018-04-13 DIAGNOSIS — L89159 Pressure ulcer of sacral region, unspecified stage: Secondary | ICD-10-CM

## 2018-04-13 DIAGNOSIS — E46 Unspecified protein-calorie malnutrition: Secondary | ICD-10-CM

## 2018-04-13 LAB — OCCULT BLOOD X 1 CARD TO LAB, STOOL: FECAL OCCULT BLD: NEGATIVE

## 2018-04-13 LAB — CBC WITH DIFFERENTIAL/PLATELET
Abs Immature Granulocytes: 0 10*3/uL (ref 0.0–0.1)
BASOS ABS: 0 10*3/uL (ref 0.0–0.1)
BASOS PCT: 0 %
EOS ABS: 0 10*3/uL (ref 0.0–0.7)
EOS PCT: 0 %
HCT: 33.6 % — ABNORMAL LOW (ref 39.0–52.0)
Hemoglobin: 10.4 g/dL — ABNORMAL LOW (ref 13.0–17.0)
Immature Granulocytes: 0 %
Lymphocytes Relative: 12 %
Lymphs Abs: 1 10*3/uL (ref 0.7–4.0)
MCH: 29.1 pg (ref 26.0–34.0)
MCHC: 31 g/dL (ref 30.0–36.0)
MCV: 94.1 fL (ref 78.0–100.0)
MONO ABS: 0.5 10*3/uL (ref 0.1–1.0)
Monocytes Relative: 7 %
Neutro Abs: 6.8 10*3/uL (ref 1.7–7.7)
Neutrophils Relative %: 81 %
Platelets: 103 10*3/uL — ABNORMAL LOW (ref 150–400)
RBC: 3.57 MIL/uL — AB (ref 4.22–5.81)
RDW: 13.7 % (ref 11.5–15.5)
WBC: 8.3 10*3/uL (ref 4.0–10.5)

## 2018-04-13 LAB — HEPARIN INDUCED PLATELET AB (HIT ANTIBODY): HEPARIN INDUCED PLT AB: 0.231 {OD_unit} (ref 0.000–0.400)

## 2018-04-13 MED ORDER — TEMAZEPAM 7.5 MG PO CAPS
7.5000 mg | ORAL_CAPSULE | Freq: Every day | ORAL | Status: DC
  Administered 2018-04-13 – 2018-05-01 (×19): 7.5 mg via ORAL
  Filled 2018-04-13 (×19): qty 1

## 2018-04-13 MED ORDER — PRO-STAT SUGAR FREE PO LIQD
30.0000 mL | Freq: Two times a day (BID) | ORAL | Status: DC
  Administered 2018-04-13 – 2018-04-14 (×3): 30 mL via ORAL
  Filled 2018-04-13 (×6): qty 30

## 2018-04-13 NOTE — Progress Notes (Signed)
Occupational Therapy Session Note  Patient Details  Name: [name] [name] Hain MRN: [id number] Date of Birth: 1966/09/02  Today's Date: 04/13/2018 OT Individual Time: 1002-1103 OT Individual Time Calculation (min): 61 min    Short Term Goals: Week 1:  OT Short Term Goal 1 (Week 1): Pt will complete LB bathing with min assist sit to stand.   OT Short Term Goal 2 (Week 1): Pt completed LB dressing with min assist sit to stand with AE.   OT Short Term Goal 3 (Week 1): Pt completed toilet transfer with min assist using the RW and elevated toilet. OT Short Term Goal 4 (Week 1): Pt will complete  tub shower transfer with min assist using the RW for support.   Skilled Therapeutic Interventions/Progress Updates:    Pt started session with min assist transfer from supine to sit EOB with use of the rail and slight HOB up.  He then used the RW to transfer to the 3:1 with min assist and increased urgency.  Successful BM as well as urination noted.  He was not able to urinate in sitting secondary to [facility] and stood X2 to complete.  Max assist for clothing management as well as toilet hygiene.  He completed transfer from the 3:1 over to the tub bench with min assist.  LH sponge provided in order for him to wash under the left arm as well as the left shoulder, and both of his lower legs and feet.  Mod instructional cueing while bathing to avoid rotating his neck as the cervical collar was removed based on MD allowance.  He needed max assist for cleaning buttocks as well.  He was able to donn hospital gown once shower was completed and apply deodorant to the right under arm with supervision and min assist on the left secondary to RUE weakness.  Min assist for functional mobility out to the bed with the RW.  He then transitioned to supine with min assist in order for nursing to apply new dressings to buttocks wounds.  Pt left with nursing at end of session.    Therapy Documentation Precautions:   Precautions Precautions: Fall Required Braces or Orthoses: Cervical Brace Cervical Brace: Hard collar Restrictions Weight Bearing Restrictions: No Other Position/Activity Restrictions: cervical collar can be donned in sitting, removed for showering, and removed while in bed per orders  Pain: Pain Assessment Pain Scale: Faces Faces Pain Scale: Hurts a little bit Pain Type: Acute pain Pain Location: Leg Pain Orientation: Left Pain Descriptors / Indicators: Discomfort Pain Onset: On-going Pain Intervention(s): Repositioned ADL: See Function Navigator for Current Functional Status.   Therapy/Group: Individual Therapy  Dalana Pfahler OTR/L 04/13/2018, 12:36 PM

## 2018-04-13 NOTE — Progress Notes (Signed)
Occupational Therapy Session Note  Patient Details  Name: [name] [name] Kallal MRN: [id number] Date of Birth: [birth year]  Today's Date: 04/13/2018 OT Individual Time: 1300-1406 OT Individual Time Calculation (min): 66 min    Short Term Goals: Week 1:  OT Short Term Goal 1 (Week 1): Pt will complete LB bathing with min assist sit to stand.   OT Short Term Goal 2 (Week 1): Pt completed LB dressing with min assist sit to stand with AE.   OT Short Term Goal 3 (Week 1): Pt completed toilet transfer with min assist using the RW and elevated toilet. OT Short Term Goal 4 (Week 1): Pt will complete  tub shower transfer with min assist using the RW for support.   Skilled Therapeutic Interventions/Progress Updates:    Pt completed transfer from supine to sit EOB with min assist.  He worked on sit to stand and standing for use of the urinal with min facilitation.  Pt demonstrated difficulty with initiation and completion of toileting.  Once completed he transferred to the wheelchair with min assist using the RW for support.  Took pt down to the gym with transfer to the therapy mat with min assist.  Educated pt on hand strengthening exercises with use of green light resistance therapy putty during session.  He worked on gross grasp, tip to tip pinch, and intrinsic finger flexion.   Emphasis placed on completing them more with the RUE as it is weaker than the left.  Issued handout as well for reference.  During session assessed his gross grip strength at 39 lbs on the right and 50 on the left.  Nine hole peg test was 32 seconds on left and 58 on the right as well.  Had pt work on picking up medium sized glass beads with both the left and right hands and place back in containers.  Container placed at shoulder height with increased difficulty noted with shoulder flexion in the RUE compared to the left.  Finished session with return back to the room via wheelchair and pt transferring back to the bed with min assist,  including transition to supine.  Encouraged pt to work on completion of therapy putty exercises later today and tonight.  Call button and phone in reach.    Therapy Documentation Precautions:  Precautions Precautions: Fall Required Braces or Orthoses: Cervical Brace Cervical Brace: Hard collar Restrictions Weight Bearing Restrictions: No Other Position/Activity Restrictions: cervical collar can be donned in sitting, removed for showering, and removed while in bed per orders  Pain: Pain Assessment Pain Scale: 0-10 Pain Score: 0-No pain Faces Pain Scale: Hurts a little bit Pain Type: Acute pain Pain Location: Leg Pain Orientation: Left Pain Descriptors / Indicators: Discomfort Pain Onset: On-going Pain Intervention(s): Repositioned ADL: See Function Navigator for Current Functional Status.   Therapy/Group: Individual Therapy  Britt Theard OTR/L 04/13/2018, 4:06 PM

## 2018-04-13 NOTE — Progress Notes (Addendum)
[place] PHYSICAL MEDICINE & REHABILITATION     PROGRESS NOTE  Subjective/Complaints:  Patient seen sitting up in bed this morning. He states he did not sleep well overnight and wants medications to help with sleep. He does note improvement in strength.  ROS: denies CP, SOB, nausea vomiting, diarrhea.  Objective: Vital Signs: Blood pressure 120/89, pulse (!) 58, temperature 97.9 F (36.6 C), temperature source Oral, resp. rate 20, weight 117.5 kg (259 lb 0.7 oz), SpO2 100 %. No results found. Recent Labs    04/12/18 0550 04/13/18 0327  WBC 8.5 8.3  HGB 10.4* 10.4*  HCT 33.2* 33.6*  PLT 98* 103*   Recent Labs    04/11/18 1814 04/12/18 0550  NA  --  141  K  --  4.6  CL  --  105  GLUCOSE  --  127*  BUN  --  26*  CREATININE 1.14 1.15  CALCIUM  --  8.8*   CBG (last 3)  No results for input(s): GLUCAP in the last 72 hours.  Wt Readings from Last 3 Encounters:  04/12/18 117.5 kg (259 lb 0.7 oz)  03/23/18 (!) 139.1 kg (306 lb 10.6 oz)    Physical Exam:  BP 120/89 (BP Location: Right Arm)   Pulse (!) 58 Comment: nurse notified  Temp 97.9 F (36.6 C) (Oral)   Resp 20   Wt 117.5 kg (259 lb 0.7 oz) Comment: weight recorded after bed was zeroed  SpO2 100%   BMI 38.25 kg/m  Constitutional: He appearswell-developed.No distress. Obese. HENT: Normocephalicand atraumatic.  Eyes:EOMI. No discharge.  Neck: Cervical collar, wound intact Cardiovascular:RRR. No JVD. Respiratory:Effort normal. Clear.  GI: He exhibitsno distension. Bowel sounds normal. Musculoskeletal: He exhibitsedema(bilateral lower extremities)and tenderness.  Neurological: He isalertand oriented  Motor:  RUE: 4/5 prox to distal.  LUE: 4+/5 proximal to distal RLE: 3/5 hip flexion, 4-/5 knee extension, 5/5 ADF LLE: 3+/5 hip flexion, 4/5 knee extension, 5/5 ankle dorsiflexion Sensation diminished to light touch right hand and foot.  Skin:Incision site c/d/i Sacral decubitus ulcer, not  examined today  Assessment/Plan: 1. Functional deficits secondary to cervical myelopathy with quadriparesis which require 3+ hours per day of interdisciplinary therapy in a comprehensive inpatient rehab setting. Physiatrist is providing close team supervision and 24 hour management of active medical problems listed below. Physiatrist and rehab team continue to assess barriers to discharge/monitor patient progress toward functional and medical goals.  Function:  Bathing Bathing position   Position: Sitting EOB  Bathing parts Body parts bathed by patient: Right arm, Left arm, Chest, Abdomen, Right upper leg, Left upper leg Body parts bathed by helper: Right lower leg, Left lower leg, Back, Front perineal area, Buttocks  Bathing assist        Upper Body Dressing/Undressing Upper body dressing   What is the patient wearing?: Pull over shirt/dress     Pull over shirt/dress - Perfomed by patient: Thread/unthread right sleeve, Thread/unthread left sleeve Pull over shirt/dress - Perfomed by helper: Put head through opening, Pull shirt over trunk        Upper body assist        Lower Body Dressing/Undressing Lower body dressing   What is the patient wearing?: Pants, Non-skid slipper socks     Pants- Performed by patient: Thread/unthread left pants leg Pants- Performed by helper: Thread/unthread right pants leg, Pull pants up/down   Non-skid slipper socks- Performed by helper: Don/doff right sock, Don/doff left sock  Lower body assist        Toileting Toileting   Toileting steps completed by patient: Performs perineal hygiene Toileting steps completed by helper: Adjust clothing prior to toileting, Adjust clothing after toileting    Toileting assist Assist level: Touching or steadying assistance (Pt.75%)   Transfers Chair/bed transfer   Chair/bed transfer method: Stand pivot Chair/bed transfer assist level: Touching or steadying assistance (Pt >  75%) Chair/bed transfer assistive device: Armrests, Patent attorney     Max distance: 38ft Assist level: Touching or steadying assistance (Pt > 75%)   Wheelchair   Type: Manual Max wheelchair distance: 125ft Assist Level: Supervision or verbal cues  Cognition Comprehension Comprehension assist level: Follows complex conversation/direction with extra time/assistive device  Expression Expression assist level: Expresses complex ideas: With extra time/assistive device  Social Interaction Social Interaction assist level: Interacts appropriately with others with medication or extra time (anti-anxiety, antidepressant).  Problem Solving Problem solving assist level: Solves basic problems with no assist  Memory Memory assist level: Complete Independence: No helper    Medical Problem List and Plan: 1.Right upper extremity weakness and bilateral lower extremity weakness secondary to OPLL/ cervical stenosis with resultant cervical myelopathy status post C3-C5 ACDF 03/24/2018. Cervical collar as directed  Cont CIR 2. DVT Prophylaxis/Anticoagulation/bilateral pulmonary emboli/bilateral posterior tibial peroneal vein WUJ:[id number] 60 mg every 12 hours x1 week then begin Eliquis 5 mg twice daily if no bleeding.  3. Pain Management:Baclofen 10 mg twice daily, Flexeril and oxycodone as needed 4. Mood:Provide emotional support 5. Neuropsych: This patientiscapable of making decisions on hisown behalf. 6. Skin/Wound Care:WOC consult for sacral decubitus ulcer 7. Fluids/Electrolytes/Nutrition:Routine in and outs  BMP within acceptable range on 7/31 8.Neurogenic bowel and bladder. Established bowel program.  9.Hypertension. Cozaar 25 mg daily.   Controlled on 8/1 10.Morbid obesity. BMI 45.29. Dietary follow-up 11.History of pernicious anemia with ABLA  Hemoglobin 10.4 on 8/1 12. Thrombocytopenia  Platelets 103 on 8/1  Discussed with lab/pharmacy for workup  for Lovenox induced thrombocytopenia - less likely  HIT pending  Hemoccult pending 13. Hypoalbuminemia  Supplement initiated on 8/1 14. Sleep disturbance  Possibly associated with PTSD  ECG reviewed, with borderline QT prolongation  Will start restoril 7.5 daily at bedtime and consider further medication changes if necessary   LOS (Days) 2 A FACE TO FACE EVALUATION WAS PERFORMED  [name] [name] 04/13/2018 8:17 AM

## 2018-04-13 NOTE — Progress Notes (Signed)
Physical Therapy Session Note  Patient Details  Name: [name] [name] MRN: [id number] Date of Birth: [birth year]  Today's Date: 04/13/2018 PT Individual Time: 1100-1200 AND 1600-1615 PT Individual Time Calculation (min): 60 min and 15 min   Short Term Goals: Week 1:  PT Short Term Goal 1 (Week 1): Pt will perform bed mobility with supervision assist  PT Short Term Goal 2 (Week 1): Pt will ambulate 114f with min assist  PT Short Term Goal 3 (Week 1): Pt will perform sit<>stand with min assist consistently PT Short Term Goal 4 (Week 1): Pt will initiate stair training   Skilled Therapeutic Interventions/Progress Updates:   Pt received supine in bed and agreeable to PT. Supine>sit transfer with supervision assist and min cues for use of UE on bed rails. Stand pivot transfers with min assist throughout treatment with UE support on RW. Pt transported to rehab gym. In [place] Standing there[place] instructed by PT; Reciprocal marches, hip abduction, HS curls, calf raises, hip extension. All completed x 10 BLE with UE support on RW.  Min assist from PT during standing therex to stabilize the RLE and prevent knee instability. Gait training with RW x 572fwith min assist from PT and multimodal cues for safety I turns, gait pattern, knee extension on the R. Patient returned to room and [facility] in reach and all needs met.      Session 2.  Pt received supine in bed and agreeable to PT. Supine>sit transfer with supervision assist and min cues for LE management. Sit<>stand from EOB with min assist and min cues for proper UE placement. Gait training x 6049fith RW and min assist from PT and min cues for AD management through doorway. . Pt returned to room and performed stand>sit transfer to bed with min assist. Sit>supine completed with mod assist to manage BLE and left supine in bed with call bell in reach and all needs met.       Therapy Documentation Precautions:   Precautions Precautions: Fall Required Braces or Orthoses: Cervical Brace Cervical Brace: Hard collar Restrictions Weight Bearing Restrictions: No Other Position/Activity Restrictions: cervical collar can be donned in sitting, removed for showering, and removed while in bed per orders Pain: Pain Assessment Pain Scale: 0-10 Pain Score: 0-No pain Faces Pain Scale: Hurts a little bit Pain Type: Acute pain Pain Location: Leg Pain Orientation: Left Pain Descriptors / Indicators: Discomfort Pain Onset: On-going Pain Intervention(s): Repositioned   See Function Navigator for Current Functional Status.   Therapy/Group: Individual Therapy  AusLorie Phenix1/2019, 4:15 PM

## 2018-04-13 NOTE — Consult Note (Signed)
WOC Nurse wound consult note Reason for Consult: moisture associated dermatitis, specifically incontinence associated dermatitis, (IAD). Patient is able to describe 3 episodes of loose stools that erodes the tissue. Patient has photograph of wound taken on Monday; wounds are 50% improved since that photo in 96 hours. Wound type: Moisture  Pressure Injury POA: Yes Measurement: Two partial thickness areas on either side of the rectum, right measures 3cm x 2.5cm x 0.1cm and left measures 3.5cm x 2cm x 0.1cm. There is  one partial thickness area on the right lower buttock/IT measuring 2cm x 2.4cm x 0.1cm Wound bed: Pink, moist, reepithelializing Drainage (amount, consistency, odor) scant serous Periwound: Intact with evidence of wound healing and re pigmentation.  Dressing procedure/placement/frequency: After discussion with patient and considering the state of this wound's repair, I will recommend either a hydrocolloid or continue the silicone foam dressings.  This would need to be continued for 1-week post complete reepithelialization.  Bedside staff RN in attendance for discussion and assessment and we agree that these lesions are moisture related, not pressure.  WOC nursing team will not follow, but will remain available to this patient, the nursing and medical teams.  Please re-consult if needed. Thanks, Ladona MowLaurie Kimball Manske, MSN, RN, GNP, Hans EdenCWOCN, CWON-AP, FAAN  Pager# [phone]

## 2018-04-13 NOTE — Progress Notes (Signed)
PRN flexeril given at 2104, otherwise without C/O pain. Poor night sleep, not unusual per patient. [name] [name], [name] [name]

## 2018-04-14 ENCOUNTER — Inpatient Hospital Stay (HOSPITAL_COMMUNITY): Admitting: Occupational Therapy

## 2018-04-14 ENCOUNTER — Inpatient Hospital Stay (HOSPITAL_COMMUNITY): Admitting: Physical Therapy

## 2018-04-14 ENCOUNTER — Inpatient Hospital Stay (HOSPITAL_COMMUNITY)

## 2018-04-14 NOTE — Care Management (Signed)
[facility] Individual Statement of Services  Patient Name:  [name] [name]  Date:  04/14/2018  Welcome to the [facility].  Our goal is to provide you with an individualized program based on your diagnosis and situation, designed to meet your specific needs.  With this comprehensive rehabilitation program, you will be expected to participate in at least 3 hours of rehabilitation therapies Monday-Friday, with modified therapy programming on the weekends.  Your rehabilitation program will include the following services:  Physical Therapy (PT), Occupational Therapy (OT), 24 hour per day rehabilitation nursing, Therapeutic Recreaction (TR), Neuropsychology, Case Management (Social Worker), Rehabilitation Medicine, Nutrition Services and Pharmacy Services  Weekly team conferences will be held on Wednesdays to discuss your progress.  Your Social Worker will talk with you frequently to get your input and to update you on team discussions.  Team conferences with you and your family in attendance may also be held.  Expected length of stay: 12-15 days   Overall anticipated outcome: supervision  Depending on your progress and recovery, your program may change. Your Social Worker will coordinate services and will keep you informed of any changes. Your Social Worker's name and contact numbers are listed  below.  The following services may also be recommended but are not provided by the [facility]:   Driving Evaluations  Home Health Rehabiltiation Services  Outpatient Rehabilitation Services  Vocational Rehabilitation   Arrangements will be made to provide these services after discharge if needed.  Arrangements include referral to agencies that provide these services.  Your insurance has been verified to be:  [employer] Your primary doctor is:  Devra Doppamieka Howell  Pertinent information will be shared with your doctor and your insurance  company.  Social Worker:  StowellLucy Eric [place], [place] [phone] or ([phone]) [phone]   Information discussed with and copy given to patient by: Amada JupiterHOYLE, Edoardo Laforte, 04/14/2018, 4:15 PM

## 2018-04-14 NOTE — Progress Notes (Signed)
Physical Therapy Session Note  Patient Details  Name: [name] [name] MRN: [id number] Date of Birth: 03/29/1966  Today's Date: 04/14/2018 PT Individual Time: 1305-1335 PT Individual Time Calculation (min): 30 min   Short Term Goals: Week 1:  PT Short Term Goal 1 (Week 1): Pt will perform bed mobility with supervision assist  PT Short Term Goal 2 (Week 1): Pt will ambulate 100ft with min assist  PT Short Term Goal 3 (Week 1): Pt will perform sit<>stand with min assist consistently PT Short Term Goal 4 (Week 1): Pt will initiate stair training    Skilled Therapeutic Interventions/Progress Updates:   Pt received sitting in WC and states that he feels terrible, secondary to multiple loose stools earlier in day, but states that he is willing to attempt PT. Sit<>stand with min assist from PT with UE support on RW. Sudden urge for BM while in standing. Pt transported to bathroom in WC. Stand pivot transfer to toilet with min assist and heavy use of rails. Pt able to have small, loose, continent BM. PT required to perform Pericare while pt standing at rail. Pt transported back to room. Stand pivot to bed. Vitals assessed. BP 127/82, HR:72, SpO2:100%, temp:98.9deg. Sit>supine with mod assist to manage BUE. Pt left semirecumbent up in bed with all needs met.        Therapy Documentation Precautions:  Precautions Precautions: Fall Required Braces or Orthoses: Cervical Brace Cervical Brace: Hard collar Restrictions Weight Bearing Restrictions: No Other Position/Activity Restrictions: cervical collar can be donned in sitting, removed for showering, and removed while in bed per orders General: PT Amount of Missed Time (min): 45 Minutes Vital Signs: see above Pain:   denies   See Function Navigator for Current Functional Status.   Therapy/Group: Individual Therapy  Austin E Tucker 04/14/2018, 2:02 PM  

## 2018-04-14 NOTE — Progress Notes (Signed)
Occupational Therapy Session Note  Patient Details  Name: [name] [name] MRN: [id number] Date of Birth: 04/18/1966  Today's Date: 04/14/2018 OT Individual Time: [id number] OT Individual Time Calculation (min): 74 min   Short Term Goals: Week 1:  OT Short Term Goal 1 (Week 1): Pt will complete LB bathing with min assist sit to stand.   OT Short Term Goal 2 (Week 1): Pt completed LB dressing with min assist sit to stand with AE.   OT Short Term Goal 3 (Week 1): Pt completed toilet transfer with min assist using the RW and elevated toilet. OT Short Term Goal 4 (Week 1): Pt will complete  tub shower transfer with min assist using the RW for support.   Skilled Therapeutic Interventions/Progress Updates:    Pt greeted supine in bed and requesting to shower. RN in to administer pain medicine at start of session. He ambulated with RW and Min A to elevated toilet. Pt incontinent of bowels after reaching threshold of bathroom. He continued BM while seated. Due to soiled sacral bandages, RN okay'd removal during shower. He bathed at sit<stand level with steady assist and RW while OT rinsed buttocks thoroughly. Per MD order, c-collar doffed during shower and reapplied aftewards. He returned to toilet to continue BM and OT donned gripper socks for time mgt. Pt ambulated back to bed and RN in to dress wounds. OT assisted with positioning and bed mobility at this time. He opted to don hospital gown in case spells of incontinence returned. Pt left in semi reclined position in bed with all needs within reach.   Therapy Documentation Precautions:  Precautions Precautions: Fall Required Braces or Orthoses: Cervical Brace Cervical Brace: Hard collar Restrictions Weight Bearing Restrictions: No Other Position/Activity Restrictions: cervical collar can be donned in sitting, removed for showering, and removed while in bed per orders Pain: Pain Assessment Pain Score: 0-No pain ADL:      See Function Navigator  for Current Functional Status.   Therapy/Group: Individual Therapy  Javonne Louissaint A Ronson Hagins 04/14/2018, 12:13 PM

## 2018-04-14 NOTE — Progress Notes (Signed)
Social Work Patient ID: [name] [name], male   DOB: [birth year], 52 y.o.   MRN: [id number]  Have reviewed team conference with pt who is aware and agreeable with targeted d/c date of 8/14 and supervision goals overall.  No concerns yet at this time.  Suzanne Kho, LCSW

## 2018-04-14 NOTE — Progress Notes (Addendum)
Occupational Therapy Session Note  Patient Details  Name: [name] [name] [place] MRN: [id number] Date of Birth: 1965/11/17  Today's Date: 04/14/2018 OT Group Time: 1100-1145 OT Group Time Calculation (min): 45 min  and Today's Date: 04/14/2018 OT Missed Time: 15 Minutes Missed Time Reason: Other (comment)(toileting)   Short Term Goals: Week 1:  OT Short Term Goal 1 (Week 1): Pt will complete LB bathing with min assist sit to stand.   OT Short Term Goal 2 (Week 1): Pt completed LB dressing with min assist sit to stand with AE.   OT Short Term Goal 3 (Week 1): Pt completed toilet transfer with min assist using the RW and elevated toilet. OT Short Term Goal 4 (Week 1): Pt will complete  tub shower transfer with min assist using the RW for support.   Skilled Therapeutic Interventions/Progress Updates:    Pt participates in skilled therapeutic tx group of making a pinata focusing on divded attention, BUE use/gross motor coordination, social participation and fine motor coordination. Pt uses BUE to fold and cut strips of tissue paper with scissors, dip in paper mache, and place paper on pinata base. Pt requires curing throughout to follow recipe to mix/measure ingredients to make paper mache paste. Pt stands for 3 min with supervision for static and dynamic standing balance reaching for needed materials. Upon standing, pt report needing to toilet. OT escort pt back to room and pt stand pivo transfer with touching A and VC for hand placement. NT assists pt with BM clean up after void/set up with lunch tray. Pt missed 15 min OT group time d/t toileting. Will follow up per POC.   Therapy Documentation Precautions:  Precautions Precautions: Fall Required Braces or Orthoses: Cervical Brace Cervical Brace: Hard collar Restrictions Weight Bearing Restrictions: No Other Position/Activity Restrictions: cervical collar can be donned in sitting, removed for showering, and removed while in bed per  orders General: General OT Amount of Missed Time: 15 Minutes Vital Signs:  Pain: Pain Assessment Pain Score: 0-No pain  See Function Navigator for Current Functional Status.   Therapy/Group: Group Therapy  Elenore PaddyStephanie M Canyon Willow 04/14/2018, 12:12 PM

## 2018-04-14 NOTE — Progress Notes (Signed)
Social Work  Social Work Assessment and Plan  Patient Details  Name: [name] [name] MRN: [id number] Date of Birth: Dec 15, 1965  Today's Date: 04/14/2018  Problem List:  Patient Active Problem List   Diagnosis Date Noted  . Hypoalbuminemia due to protein-calorie malnutrition (HCC)   . PTSD (post-traumatic stress disorder)   . Sleep disorder   . Anasarca   . Thrombocytopenia (HCC)   . Morbid obesity (HCC)   . Acute blood loss anemia   . Benign essential HTN   . Cervical myelopathy (HCC) 04/11/2018  . Pressure injury of skin 04/10/2018  . Acute deep vein thrombosis (DVT) of lower extremity (HCC)   . Bleeding   . Pulmonary embolism (HCC)bilateral, failed heparin IVC filter placed7/16 03/29/2018  . Preop cardiovascular exam   . Acute deep vein thrombosis (DVT) of right lower extremity (HCC)   . Myelopathy (HCC) 03/22/2018   Past Medical History:  Past Medical History:  Diagnosis Date  . Degenerative disc disease, lumbar   . Gout   . History of pernicious anemia 1980   Patient says he takes OTC iron supplements QD  . Hypertension    Past Surgical History:  Past Surgical History:  Procedure Laterality Date  . ANTERIOR CERVICAL CORPECTOMY N/A 03/24/2018   Procedure: CERVICAL FOUR ANTERIOR CERVICAL CORPECTOMY;  Surgeon: Donalee Citrinram, Gary, MD;  [facility];  Service: Neurosurgery;  Laterality: N/A;  . ANTERIOR CERVICAL DECOMP/DISCECTOMY FUSION N/A 03/28/2018   Procedure: Reexploration of anterior cervical wound;  Surgeon: Donalee Citrinram, Gary, MD;  [facility];  Service: Neurosurgery;  Laterality: N/A;  . IR IVC FILTER PLMT / S&I Lenise Arena/IMG GUID/MOD SED  03/28/2018  . MENISCUS REPAIR Left   . TENDON REPAIR     tricept tendon   Social History:  reports that he has never smoked. He has never used smokeless tobacco. He reports that he does not drink alcohol or use drugs.  Family / Support Systems Marital Status: Separated Patient Roles: Parent Other Supports: parents:   Harden MoMamie (mother) @ (C)  [phone] and father Gerlene Burdock(Richard) @ [phone] Anticipated Caregiver: parents Ability/Limitations of Caregiver: Parents are able to provide physical assistance as needed Caregiver Availability: 24/7 Family Dynamics: Pt describes very good relationship with parents and other family.  Denies any concerns about support at home.  Social History Preferred language: English Religion:  Cultural Background: NA Read: Yes Write: Yes Employment Status: Employed Name of Employer: [employer] Length of Employment: 3(yrs) Return to Work Plans: Pt very concerned about whether he will be able to return to the time of work he was doing given the physical demands. Legal Hisotry/Current Legal Issues: None Guardian/Conservator: None - per MD, pt is capable of making decisions on his own behalf.   Abuse/Neglect Abuse/Neglect Assessment Can Be Completed: Yes Physical Abuse: Denies Verbal Abuse: Denies Sexual Abuse: Denies Exploitation of patient/patient's resources: Denies Self-Neglect: Denies  Emotional Status Pt's affect, behavior adn adjustment status: Pt very pleasant, talkative and completes assessment interview without difficulty.  He talks at length about the severity of cervical stenosis found and amazement that he did not have any obvious symptoms "...that I had a neck of an 52 yr old according to Dr. Wynetta Emeryram..."  He also notes concern about the new found issue with clotting.  Feels he has been given a lot of medical concerns at once and states, "I guess I'm dealing with it ok...it's a lot though..."  Will monitor and may benefit from neuropsych involvement. Recent Psychosocial Issues: Separation from wife and has been  living in an extended stay hotel.  Stressful as he is also limited in time he can see his 7yo son. Pyschiatric History: None Substance Abuse History: None  Patient / Family Perceptions, Expectations & Goals Pt/Family understanding of illness & functional limitations: Pt with very  good understanding of his medical issues and able to provide extensive information to this SW.  Good understanding of his functional limitations/ need for CIR. Premorbid pt/family roles/activities: Pt was completely independent and living alone, working f/t Anticipated changes in roles/activities/participation: Pt to d/c to parents' home.  They will be providing any caregiver support needed. Pt/family expectations/goals: "I just hope I can get as much return (function) as possible."  [employer]: None Premorbid Home Care/DME Agencies: None Transportation available at discharge: yes Resource referrals recommended: Neuropsychology  Discharge Planning Living Arrangements: Alone Support Systems: Parent Type of Residence: Private residence Insurance Resources: Media planner (specify) Financial Resources: Employment Financial Screen Referred: No Living Expenses: Psychologist, sport and exercise Management: Patient Does the patient have any problems obtaining your medications?: No Home Management: pt Patient/Family Preliminary Plans: Pt plans to d/c home with parents who live in [place] Social Work Anticipated Follow Up Needs: HH/OP Expected length of stay: 12-15 days  Clinical Impression Very pleasant gentleman here following cervical surgery and additional complications.  Doing well overall on eval and team anticipating supervision level goals.  Pt very motivated and denies any significant emotional distress but does admit to "feeling a little overwhelmed...". Will monitor and may refer for neuropsychology support.  Plan to d/c home with parents who can provide 24/7 assistance if needed.   Dafina Suk 04/14/2018, 4:13 PM

## 2018-04-14 NOTE — Progress Notes (Signed)
[facility] PHYSICAL MEDICINE & REHABILITATION     PROGRESS NOTE  Subjective/Complaints:  Patient seen sitting up in bed this morning. He states he slept much better last night. He states he feels better this morning. He notes improvement in sensation.    ROS: Denies CP, SOB, nausea vomiting, diarrhea.  Objective: Vital Signs: Blood pressure 117/77, pulse (!) 54, temperature 98 F (36.7 C), temperature source Oral, resp. rate 20, weight 117.5 kg (259 lb 0.7 oz), SpO2 99 %. No results found. Recent Labs    04/12/18 0550 04/13/18 0327  WBC 8.5 8.3  HGB 10.4* 10.4*  HCT 33.2* 33.6*  PLT 98* 103*   Recent Labs    04/11/18 1814 04/12/18 0550  NA  --  141  K  --  4.6  CL  --  105  GLUCOSE  --  127*  BUN  --  26*  CREATININE 1.14 1.15  CALCIUM  --  8.8*   CBG (last 3)  No results for input(s): GLUCAP in the last 72 hours.  Wt Readings from Last 3 Encounters:  04/12/18 117.5 kg (259 lb 0.7 oz)  03/23/18 (!) 139.1 kg (306 lb 10.6 oz)    Physical Exam:  BP 117/77 (BP Location: Right Arm)   Pulse (!) 54   Temp 98 F (36.7 C) (Oral)   Resp 20   Wt 117.5 kg (259 lb 0.7 oz) Comment: weight recorded after bed was zeroed  SpO2 99%   BMI 38.25 kg/m  Constitutional: He appearswell-developed.No distress. Obese. HENT: Normocephalicand atraumatic.  Eyes:EOMI. No discharge.  Neck: Cervical collar, wound intact Cardiovascular:RRR. No JVD. Respiratory:Effort normal. Clear.  GI: He exhibitsno distension. Bowel sounds normal. Musculoskeletal: He exhibitsedema(bilateral lower extremities)and tenderness, improving.  Neurological: He isalertand oriented  Motor:  RUE: 4/5 prox to distal (stable)  LUE: 4+/5 proximal to distal (stable) RLE: 3/5 hip flexion, 4-/5 knee extension, 5/5 ADF (stable) LLE: 3+/5 hip flexion, 4/5 knee extension, 5/5 ankle dorsiflexion (stable) Sensation diminished to light touch right hand and foot, improving.  Skin:Incision site c/d/i Sacral  wound, not examined today  Assessment/Plan: 1. Functional deficits secondary to cervical myelopathy with quadriparesis which require 3+ hours per day of interdisciplinary therapy in a comprehensive inpatient rehab setting. Physiatrist is providing close team supervision and 24 hour management of active medical problems listed below. Physiatrist and rehab team continue to assess barriers to discharge/monitor patient progress toward functional and medical goals.  Function:  Bathing Bathing position   Position: Shower  Bathing parts Body parts bathed by patient: Right arm, Left arm, Chest, Abdomen, Front perineal area, Right upper leg, Left upper leg, Right lower leg, Left lower leg Body parts bathed by helper: Buttocks, Back  Bathing assist        Upper Body Dressing/Undressing Upper body dressing   What is the patient wearing?: Hospital gown     Pull over shirt/dress - Perfomed by patient: Thread/unthread right sleeve, Thread/unthread left sleeve Pull over shirt/dress - Perfomed by helper: Put head through opening, Pull shirt over trunk        Upper body assist        Lower Body Dressing/Undressing Lower body dressing   What is the patient wearing?: Non-skid slipper socks     Pants- Performed by patient: Thread/unthread left pants leg Pants- Performed by helper: Thread/unthread right pants leg, Pull pants up/down   Non-skid slipper socks- Performed by helper: Don/doff right sock, Don/doff left sock  Lower body assist        Toileting Toileting   Toileting steps completed by patient: Performs perineal hygiene Toileting steps completed by helper: Adjust clothing prior to toileting, Performs perineal hygiene, Adjust clothing after toileting    Toileting assist Assist level: Touching or steadying assistance (Pt.75%)   Transfers Chair/bed transfer   Chair/bed transfer method: Stand pivot Chair/bed transfer assist level: Touching or steadying  assistance (Pt > 75%) Chair/bed transfer assistive device: Armrests, Patent attorney     Max distance: 53ft Assist level: Touching or steadying assistance (Pt > 75%)   Wheelchair   Type: Manual Max wheelchair distance: 161ft Assist Level: Supervision or verbal cues  Cognition Comprehension Comprehension assist level: Follows complex conversation/direction with extra time/assistive device  Expression Expression assist level: Expresses complex ideas: With extra time/assistive device  Social Interaction Social Interaction assist level: Interacts appropriately with others with medication or extra time (anti-anxiety, antidepressant).  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 Cervical collar as directed  Cont CIR 2. DVT Prophylaxis/Anticoagulation/bilateral pulmonary emboli/bilateral posterior tibial peroneal vein ZOX:WRUEAVW 60 mg every 12 hours x1 week then begin Eliquis 5 mg twice daily if no bleeding.  3. Pain Management:Baclofen 10 mg twice daily, Flexeril and oxycodone as needed 4. Mood:Provide emotional support 5. Neuropsych: This patientiscapable of making decisions on hisown behalf. 6. Skin/Wound Care:WOC consult for moisture associated sacral wound 7. Fluids/Electrolytes/Nutrition:Routine in and outs  BMP within acceptable range on 7/31 8.Neurogenic bowel and bladder. Established bowel program.  9.Hypertension. Cozaar 25 mg daily.   Labile and 8/2 10.Morbid obesity. BMI 45.29. Dietary follow-up 11.History of pernicious anemia with ABLA  Hemoglobin 10.4 on 8/1  Labs ordered for Monday 12.  Thrombocytopenia  Platelets 103 on 8/1  Discussed with lab/pharmacy for workup for Lovenox induced thrombocytopenia - less likely  HIT within normal limits on 7/31  Hemoccult negative on 8/1  Labs ordered for Monday 13. Hypoalbuminemia  Supplement initiated on 8/1 14. Sleep disturbance  Possibly associated with PTSD  ECG reviewed, with borderline QT prolongation  Started restoril 7.5 daily at bedtime and consider further medication changes if necessary  Improving  LOS (Days) 3 A FACE TO FACE EVALUATION WAS PERFORMED  Ankit Karis Juba 04/14/2018 8:15 AM

## 2018-04-14 NOTE — Patient Care Conference (Signed)
Inpatient RehabilitationTeam Conference and Plan of Care Update Date: 04/12/2018   Time: 11:40 am    Patient Name: [name] [name]      Medical Record Number: [id number]  Date of Birth: 12-09-1965 Sex: Male         Room/Bed: 4W19C/4W19C-01 Payor Info: Payor: TRICARE / Plan: TRICARE [place] / Product Type: *No Product type* /    Admitting Diagnosis: Cervical Myelopathy  Admit Date/Time:  04/11/2018  6:01 PM Admission Comments: No comment available   Primary Diagnosis:  <principal problem not specified> Principal Problem: <principal problem not specified>  Patient Active Problem List   Diagnosis Date Noted  . Hypoalbuminemia due to protein-calorie malnutrition (HCC)   . PTSD (post-traumatic stress disorder)   . Sleep disorder   . Anasarca   . Thrombocytopenia (HCC)   . Morbid obesity (HCC)   . Acute blood loss anemia   . Benign essential HTN   . Cervical myelopathy (HCC) 04/11/2018  . Pressure injury of skin 04/10/2018  . Acute deep vein thrombosis (DVT) of lower extremity (HCC)   . Bleeding   . Pulmonary embolism (HCC)bilateral, failed heparin IVC filter placed7/16 03/29/2018  . Preop cardiovascular exam   . Acute deep vein thrombosis (DVT) of right lower extremity (HCC)   . Myelopathy (HCC) 03/22/2018    Expected Discharge Date: Expected Discharge Date: 04/26/18  Team Members Present: Physician leading conference: Dr. Maryla Morrow Social Worker Present: Amada Jupiter, LCSW Nurse Present: Kennon Portela, RN PT Present: Grier Rocher, PT OT Present: Perrin Maltese, OT SLP Present: Colin Benton, SLP PPS Coordinator present : Tora Duck, RN, CRRN     Current Status/Progress Goal Weekly Team Focus  Medical   Right upper extremity weakness and bilateral lower extremity weakness secondary to OPLL/ cervical stenosis with resultant cervical myelopathy status post C3-C5 ACDF 03/24/2018.  Cervical collar as directed  Improve mobility, transfers, self-care, BP, Hb  See above    Bowel/Bladder   Continent of bowel/bladder with some urgency; LBM 04/11/18  Remain continent of bowel/bladder with min assist  assess bowel/bladder function q shift   Swallow/Nutrition/ Hydration             ADL's   Eval pending         Mobility   Eval pending         Communication             Safety/Cognition/ Behavioral Observations            Pain   No complain of pain  <2  Assess and treat pain q shift and as needed   Skin   Stage II to the butt with foam dsg, abbration to the elbow OTA  No new skin breakdown/infection with min assist  Assess skin q shift and as needed    Rehab Goals Patient on target to meet rehab goals: Yes Rehab Goals Revised: new eval *See Care Plan and progress notes for long and short-term goals.     Barriers to Discharge  Current Status/Progress Possible Resolutions Date Resolved   Physician    Wound Care;Medical stability;Weight;Other (comments)  Bleeding  See above  Therapies, optimize BP meds, follow Hb, follow platelets      Nursing  Medical stability  Patient siad he will be moving in with his parents            PT  Inaccessible home environment;Decreased caregiver support;Medical stability;Home environment access/layout  OT                  SLP                SW                Discharge Planning/Teaching Needs:  Plan possibly to stay with parents at d/c if needed.  TBD   Team Discussion:  New eval; monitor INRs and blood counts;  Checking hemoccult.  Mod assist UB and max LB self-care.  Weaker on R UE.  Min assist with PT overall.  Goals being set for supervision overall.    Revisions to Treatment Plan:  None    Continued Need for Acute Rehabilitation Level of Care: The patient requires daily medical management by a physician with specialized training in physical medicine and rehabilitation for the following conditions: Daily direction of a multidisciplinary physical rehabilitation program to ensure safe  treatment while eliciting the highest outcome that is of practical value to the patient.: Yes Daily medical management of patient stability for increased activity during participation in an intensive rehabilitation regime.: Yes Daily analysis of laboratory values and/or radiology reports with any subsequent need for medication adjustment of medical intervention for : Post surgical problems;Neurological problems;Blood pressure problems;Other  Michaelann Gunnoe 04/14/2018, 4:01 PM

## 2018-04-15 ENCOUNTER — Inpatient Hospital Stay (HOSPITAL_COMMUNITY)

## 2018-04-15 ENCOUNTER — Inpatient Hospital Stay (HOSPITAL_COMMUNITY): Admitting: Physical Therapy

## 2018-04-15 LAB — OCCULT BLOOD X 1 CARD TO LAB, STOOL: Fecal Occult Bld: POSITIVE — AB

## 2018-04-15 MED ORDER — LOPERAMIDE HCL 2 MG PO CAPS
2.0000 mg | ORAL_CAPSULE | ORAL | Status: DC | PRN
  Administered 2018-04-15 – 2018-04-30 (×11): 2 mg via ORAL
  Filled 2018-04-15 (×11): qty 1

## 2018-04-15 NOTE — Progress Notes (Signed)
Physical Therapy Session Note  Patient Details  Name: [name] [name] MRN: [id number] Date of Birth: 01-09-1966  Today's Date: 04/15/2018 PT Individual Time: 0945-1100 PT Individual Time Calculation (min): 75 min   Short Term Goals: Week 1:  PT Short Term Goal 1 (Week 1): Pt will perform bed mobility with supervision assist  PT Short Term Goal 2 (Week 1): Pt will ambulate 141f with min assist  PT Short Term Goal 3 (Week 1): Pt will perform sit<>stand with min assist consistently PT Short Term Goal 4 (Week 1): Pt will initiate stair training  Week 2:     Skilled Therapeutic Interventions/Progress Updates:   Pt received sitting in WC and agreeable to PT.   WC mobility  575fwith BUE. Pt reports mild increase in N/T in the RUE with WC propulsion. Therefore WC propulsion stopped by PT.   Gait training with RW x 8012fith min assist for safety. Min cues for for terminal knee extension on the R LE.   Sit<>stand with push from thighs 24in x 3. 22.5in x3. 21.5in x3 and 20in x 3 min assist from PT throughout for safety and min cues for improved anterior weight shift.   Stair training x 4 with mod assist for safety. Moderate cues for step to gait pattern and   Standing balance instructed by PT to toss ball to self up to eye level. 2 x 30sec with min assist, pt noted to have decreased knee stability.   Pt returned to room and performed stand pivot to toilet for bowel movement; peri care performed by PT. Stand transfer to bed with min assist. Sit>supine completed with mod assist, and left supine in bed with call bell in reach and all needs met.        Therapy Documentation Precautions:  Precautions Precautions: Fall Required Braces or Orthoses: Cervical Brace Cervical Brace: Hard collar Restrictions Weight Bearing Restrictions: No Other Position/Activity Restrictions: cervical collar can be donned in sitting, removed for showering, and removed while in bed per orders   Pain:  denies   See Function Navigator for Current Functional Status.   Therapy/Group: Individual Therapy  AusLorie Phenix3/2019, 12:49 PM

## 2018-04-15 NOTE — Progress Notes (Signed)
Occupational Therapy Session Note  Patient Details  Name: [name] [name] MRN: [id number] Date of Birth: 1965-11-03  Today's Date: 04/15/2018 OT Individual Time: 1828-8337 OT Individual Time Calculation (min): 60 min    Short Term Goals: Week 1:  OT Short Term Goal 1 (Week 1): Pt will complete LB bathing with min assist sit to stand.   OT Short Term Goal 2 (Week 1): Pt completed LB dressing with min assist sit to stand with AE.   OT Short Term Goal 3 (Week 1): Pt completed toilet transfer with min assist using the RW and elevated toilet. OT Short Term Goal 4 (Week 1): Pt will complete  tub shower transfer with min assist using the RW for support.   Skilled Therapeutic Interventions/Progress Updates:    1;1. Pt received in bed with no c/o pain. Pt completes supine>sitting EOB with supervision. Pt completes stand pivot transfer with touching A EOB>w/c with RW and VC for hand placement on sit to stand. Pt completes seated wii bowling activity with focus on RUE use/gross ROM for arm swing with min instructional cues for remote use. Pt requesting to go outside. OT propels w/c to/from outdoor patio for time management. Pt ambulates 2x25' on uneven brick surface with RW with CGA and VC for reach back when transfering onto bench. Exited session with pt seated in w/c, call light in reach and all needs met.  Therapy Documentation Precautions:  Precautions Precautions: Fall Required Braces or Orthoses: Cervical Brace Cervical Brace: Hard collar Restrictions Weight Bearing Restrictions: No Other Position/Activity Restrictions: cervical collar can be donned in sitting, removed for showering, and removed while in bed per orders General:  See Function Navigator for Current Functional Status.   Therapy/Group: Individual Therapy  Tonny Branch 04/15/2018, 4:52 PM

## 2018-04-15 NOTE — Progress Notes (Signed)
Occupational Therapy Session Note  Patient Details  Name: [name] [name] MRN: [id number] Date of Birth: 1966/07/18  Today's Date: 04/15/2018 OT Individual Time: 1191-47820800-0915 OT Individual Time Calculation (min): 75 min    Short Term Goals: Week 1:  OT Short Term Goal 1 (Week 1): Pt will complete LB bathing with min assist sit to stand.   OT Short Term Goal 2 (Week 1): Pt completed LB dressing with min assist sit to stand with AE.   OT Short Term Goal 3 (Week 1): Pt completed toilet transfer with min assist using the RW and elevated toilet. OT Short Term Goal 4 (Week 1): Pt will complete  tub shower transfer with min assist using the RW for support.   Skilled Therapeutic Interventions/Progress Updates:    1:1. Pt reporting need to toilet. No pain reported. Pt ambulates EOB>toilet/BSC with MIN A for steadying. Pt requires increased time for BM. OT provides A for posterior hygiene after BM. Pt  Transfers to TTB in shower with CGA using RW. Pt completes bathing at seated level with supervision and VC for LHSS use to wash B feet. Pt donning gowns after shower d/t frequent BMs. Pt unable to pull sock aide enough to don socks. OT dons for him. In ADL kitchen pt reaches in cabinets/cupbords for items and side steps along counter with touching A for balance/reaching with RUE. Pt completes dynamometer assesment and blocks and blcoks assessment as follows  RUE 19.0 kg; 35 Blocks LUE  29 kg; 47 blocks  Therapy Documentation Precautions:  Precautions Precautions: Fall Required Braces or Orthoses: Cervical Brace Cervical Brace: Hard collar Restrictions Weight Bearing Restrictions: No Other Position/Activity Restrictions: cervical collar can be donned in sitting, removed for showering, and removed while in bed per orders General:    See Function Navigator for Current Functional Status.   Therapy/Group: Individual Therapy  [name] HaleStephanie M Elder Davidian 04/15/2018, 8:17 AM

## 2018-04-15 NOTE — Progress Notes (Signed)
[facility] PHYSICAL MEDICINE & REHABILITATION     PROGRESS NOTE  Subjective/Complaints:  Reports ongoing loose stool.  Otherwise pleased with his progress.  ROS: Patient denies fever, rash, sore throat, blurred vision, nausea, vomiting,   cough, shortness of breath or chest pain, joint or back pain, headache, or mood change.   Objective: Vital Signs: Blood pressure 127/68, pulse (!) 56, temperature 98.5 F (36.9 C), temperature source Oral, resp. rate 17, weight 117.5 kg (259 lb 0.7 oz), SpO2 99 %. No results found. Recent Labs    04/13/18 0327  WBC 8.3  HGB 10.4*  HCT 33.6*  PLT 103*   No results for input(s): NA, K, CL, GLUCOSE, BUN, CREATININE, CALCIUM in the last 72 hours.  Invalid input(s): CO CBG (last 3)  No results for input(s): GLUCAP in the last 72 hours.  Wt Readings from Last 3 Encounters:  04/12/18 117.5 kg (259 lb 0.7 oz)  03/23/18 (!) 139.1 kg (306 lb 10.6 oz)    Physical Exam:  BP 127/68 (BP Location: Right Arm)   Pulse (!) 56   Temp 98.5 F (36.9 C) (Oral)   Resp 17   Wt 117.5 kg (259 lb 0.7 oz) Comment: weight recorded after bed was zeroed  SpO2 99%   BMI 38.25 kg/m  Constitutional: No distress . Vital signs reviewed. HEENT: EOMI, oral membranes moist Neck: supple Cardiovascular: RRR without murmur. No JVD    Respiratory: CTA Bilaterally without wheezes or rales. Normal effort    GI: BS +, non-tender, non-distended  Musculoskeletal: He exhibitstrace edema(bilateral lower extremities)and tenderness, improving.  Neurological: He isalertand oriented  Motor:  RUE: 4/5 prox to distal (stable)  LUE: 4+/5 proximal to distal (stable) RLE: 3/5 hip flexion, 4-/5 knee extension, 5/5 ADF (stable) LLE: 3+/5 hip flexion, 4/5 knee extension, 5/5 ankle dorsiflexion (stable) Sensation diminished to light touch right hand and foot, improving.  Skin:Incision site c/d/i Sacral wound, not examined today  Assessment/Plan: 1. Functional deficits secondary  to cervical myelopathy with quadriparesis which require 3+ hours per day of interdisciplinary therapy in a comprehensive inpatient rehab setting. Physiatrist is providing close team supervision and 24 hour management of active medical problems listed below. Physiatrist and rehab team continue to assess barriers to discharge/monitor patient progress toward functional and medical goals.  Function:  Bathing Bathing position   Position: Shower  Bathing parts Body parts bathed by patient: Right arm, Left arm, Chest, Abdomen, Front perineal area, Right upper leg, Left upper leg, Right lower leg, Left lower leg Body parts bathed by helper: Buttocks, Back  Bathing assist Assist Level: Assistive device, Touching or steadying assistance(Pt > 75%)(LH sponge)      Upper Body Dressing/Undressing Upper body dressing   What is the patient wearing?: Hospital gown     Pull over shirt/dress - Perfomed by patient: Thread/unthread right sleeve, Thread/unthread left sleeve Pull over shirt/dress - Perfomed by helper: Put head through opening, Pull shirt over trunk        Upper body assist Assist Level: Set up      Lower Body Dressing/Undressing Lower body dressing   What is the patient wearing?: Non-skid slipper socks     Pants- Performed by patient: Thread/unthread left pants leg Pants- Performed by helper: Thread/unthread right pants leg, Pull pants up/down   Non-skid slipper socks- Performed by helper: Don/doff right sock, Don/doff left sock                  Lower body assist Assist for lower body  dressing: (patient performed 20%, total assist)      Toileting Toileting   Toileting steps completed by patient: Adjust clothing prior to toileting, Performs perineal hygiene, Adjust clothing after toileting(per Leann Nickles-Wright, NT) Toileting steps completed by helper: Adjust clothing prior to toileting, Performs perineal hygiene(No clothing to adjust post toileting)    Toileting  assist Assist level: Supervision or verbal cues   Transfers Chair/bed transfer   Chair/bed transfer method: Stand pivot Chair/bed transfer assist level: Touching or steadying assistance (Pt > 75%) Chair/bed transfer assistive device: Walker, Designer, fashion/clothingArmrests     Locomotion Ambulation     Max distance: 7860ft Assist level: Touching or steadying assistance (Pt > 75%)   Wheelchair   Type: Manual Max wheelchair distance: 16550ft Assist Level: Supervision or verbal cues  Cognition Comprehension Comprehension assist level: Follows complex conversation/direction with no assist  Expression Expression assist level: Expresses complex ideas: With no assist  Social Interaction Social Interaction assist level: Interacts appropriately 90% of the time - Needs monitoring or encouragement for participation or interaction.  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 Cervical collar as directed  Cont CIR 2. DVT Prophylaxis/Anticoagulation/bilateral pulmonary emboli/bilateral posterior tibial peroneal vein JWJ:[id number]:Lovenox 60 mg every 12 hours x1 week then begin Eliquis 5 mg twice daily if no bleeding.  3. Pain Management:Baclofen 10 mg twice daily, Flexeril and oxycodone as needed 4. Mood:Provide emotional support 5. Neuropsych: This patientiscapable of making decisions on hisown behalf. 6. Skin/Wound Care:Appreciate WOC input for moisture associated sacral wound 7. Fluids/Electrolytes/Nutrition:Routine in and outs  BMP within acceptable range on 7/31 8.Neurogenic bowel and bladder. Patient now with loose stool  -Hold Colace, Imodium as needed 9.Hypertension.  Cozaar 25 mg daily.   Reasonable control 8/3 10.Morbid obesity. BMI 45.29. Dietary follow-up 11.History of pernicious anemia with ABLA  Hemoglobin 10.4 on 8/1  Labs ordered for Monday 12. Thrombocytopenia  Platelets 103 on 8/1  Discussed with lab/pharmacy for workup for Lovenox induced thrombocytopenia - less likely  HIT within normal limits on 7/31  Hemoccult negative on 8/1  Labs ordered for Monday 13. Hypoalbuminemia  Supplement initiated on 8/1 14. Sleep disturbance  Possibly associated with PTSD  ECG reviewed, with borderline QT prolongation  Started restoril 7.5 daily at bedtime and consider further medication changes if necessary  Improved sleep patterns over the last 2 nights  LOS (Days) 4 A FACE TO FACE EVALUATION WAS PERFORMED  Ranelle OysterZachary T Swartz 04/15/2018 8:16 AM

## 2018-04-16 ENCOUNTER — Inpatient Hospital Stay (HOSPITAL_COMMUNITY)

## 2018-04-16 DIAGNOSIS — R195 Other fecal abnormalities: Secondary | ICD-10-CM

## 2018-04-16 LAB — CBC
HEMATOCRIT: 36.6 % — AB (ref 39.0–52.0)
Hemoglobin: 11.4 g/dL — ABNORMAL LOW (ref 13.0–17.0)
MCH: 29.8 pg (ref 26.0–34.0)
MCHC: 31.1 g/dL (ref 30.0–36.0)
MCV: 95.6 fL (ref 78.0–100.0)
Platelets: 134 10*3/uL — ABNORMAL LOW (ref 150–400)
RBC: 3.83 MIL/uL — ABNORMAL LOW (ref 4.22–5.81)
RDW: 14 % (ref 11.5–15.5)
WBC: 6.5 10*3/uL (ref 4.0–10.5)

## 2018-04-16 LAB — OCCULT BLOOD X 1 CARD TO LAB, STOOL: FECAL OCCULT BLD: NEGATIVE

## 2018-04-16 MED ORDER — FAMOTIDINE 20 MG PO TABS
20.0000 mg | ORAL_TABLET | Freq: Two times a day (BID) | ORAL | Status: DC
  Administered 2018-04-16 – 2018-05-01 (×31): 20 mg via ORAL
  Filled 2018-04-16 (×31): qty 1

## 2018-04-16 MED ORDER — SACCHAROMYCES BOULARDII 250 MG PO CAPS
250.0000 mg | ORAL_CAPSULE | Freq: Two times a day (BID) | ORAL | Status: DC
  Administered 2018-04-16 – 2018-05-02 (×33): 250 mg via ORAL
  Filled 2018-04-16 (×33): qty 1

## 2018-04-16 NOTE — Progress Notes (Signed)
Occupational Therapy Session Note  Patient Details  Name: [name] [name] MRN: [id number] Date of Birth: 08/09/1966  Today's Date: 04/16/2018 OT Individual Time: 0659-0808 OT Individual Time Calculation (min): 69 min    Short Term Goals: Week 1:  OT Short Term Goal 1 (Week 1): Pt will complete LB bathing with min assist sit to stand.   OT Short Term Goal 2 (Week 1): Pt completed LB dressing with min assist sit to stand with AE.   OT Short Term Goal 3 (Week 1): Pt completed toilet transfer with min assist using the RW and elevated toilet. OT Short Term Goal 4 (Week 1): Pt will complete  tub shower transfer with min assist using the RW for support.   Skilled Therapeutic Interventions/Progress Updates:    1:1. Pt with no c/o pain, just tightness from edema in BLE. All mobility with C collar donned.  Pt supine>sitting with supervision and increased time to manage bed linens. Pt ambulates with RW and supervision to BSC over toilet to have BM. Pt requesting OT to complete posterior hygiene d/t many bandages from skin breakdown. OT educates on options for toieting hygiene such as toilet aide and tongs if pt demo difficulty with hygiene. Pt completes ambulatory transfer using grab bar to turn and sit on TTB with supervision. Pt completes bathing at seated level as OT already washed buttocks Pt able to bring RUE to L armpit to wash this date. Pt transfer to w/c with RW as stated above. Pt completes donning gowns and OT educates on propping foot on supportive surfaces to don socks. Pt able to don B socks with foot elevated. Pt stands with S for oral care at sink without use of red foam handle on toothbrush. Pt ambulates from doorway to EOB with RW and supervision. Pt completes sitting EOB>supine with supervision and OT sets up breakfast tray. OT prepares functional activity HEP for hands and reviews with pt (finding beads in putty, flipping over blocks, palm<>finger translation of coins and placing into money  slot, flipping over coins) to improve NMR/FMC of BUE. Pt able to return demo all after eating and reports will perform activities during breaks between therapy. Exited session with pt seated in bed, call light in reach and all needs met.   Therapy Documentation Precautions:  Precautions Precautions: Fall Required Braces or Orthoses: Cervical Brace Cervical Brace: Hard collar Restrictions Weight Bearing Restrictions: No Other Position/Activity Restrictions: cervical collar can be donned in sitting, removed for showering, and removed while in bed per orders  See Function Navigator for Current Functional Status.   Therapy/Group: Individual Therapy  Stephanie M Schlosser 04/16/2018, 7:13 AM  

## 2018-04-16 NOTE — Progress Notes (Signed)
Notified Dr. Hermelinda MedicusSchwartz about patient stool sample result; positive occult; verbal order not to give schedule Lovenox this shift. Will continue to monitor

## 2018-04-16 NOTE — Progress Notes (Addendum)
[place] PHYSICAL MEDICINE & REHABILITATION     PROGRESS NOTE  Subjective/Complaints:   a few incontinent loose stools again yesterday and this morning. Denies any cramping.   ROS: Patient denies fever, rash, sore throat, blurred vision, nausea, vomiting,   cough, shortness of breath or chest pain, joint or back pain, headache, or mood change.   Objective: Vital Signs: Blood pressure 116/73, pulse 61, temperature 97.9 F (36.6 C), temperature source Oral, resp. rate 18, weight 117.5 kg (259 lb 0.7 oz), SpO2 99 %. No results found. No results for input(s): WBC, HGB, HCT, PLT in the last 72 hours. No results for input(s): NA, K, CL, GLUCOSE, BUN, CREATININE, CALCIUM in the last 72 hours.  Invalid input(s): CO CBG (last 3)  No results for input(s): GLUCAP in the last 72 hours.  Wt Readings from Last 3 Encounters:  04/12/18 117.5 kg (259 lb 0.7 oz)  03/23/18 (!) 139.1 kg (306 lb 10.6 oz)    Physical Exam:  BP 116/73 (BP Location: Right Wrist)   Pulse 61   Temp 97.9 F (36.6 C) (Oral)   Resp 18   Wt 117.5 kg (259 lb 0.7 oz) Comment: weight recorded after bed was zeroed  SpO2 99%   BMI 38.25 kg/m  Constitutional: No distress . Vital signs reviewed. HEENT: EOMI, oral membranes moist Neck: supple Cardiovascular: RRR without murmur. No JVD    Respiratory: CTA Bilaterally without wheezes or rales. Normal effort    GI: BS +, non-tender, non-distended   Musculoskeletal: He exhibitstrace edema(bilateral lower extremities)and tenderness, improving.  Neurological: He isalertand oriented  Motor:  RUE: 4/5 prox to distal (stable)  LUE: 4+/5 proximal to distal (stable) RLE: 3/5 hip flexion, 4-/5 knee extension, 5/5 ADF (stable) LLE: 3+/5 hip flexion, 4/5 knee extension, 5/5 ankle dorsiflexion (stable) Sensation diminished to light touch right hand and foot, improving.  Skin:Incision site c/d/i Sacral wound dressed  Assessment/Plan: 1. Functional deficits secondary to  cervical myelopathy with quadriparesis which require 3+ hours per day of interdisciplinary therapy in a comprehensive inpatient rehab setting. Physiatrist is providing close team supervision and 24 hour management of active medical problems listed below. Physiatrist and rehab team continue to assess barriers to discharge/monitor patient progress toward functional and medical goals.  Function:  Bathing Bathing position   Position: Shower  Bathing parts Body parts bathed by patient: Right arm, Left arm, Chest, Abdomen, Front perineal area, Right upper leg, Left upper leg, Right lower leg, Left lower leg Body parts bathed by helper: Buttocks, Back  Bathing assist Assist Level: Assistive device, Touching or steadying assistance(Pt > 75%)(LH sponge)      Upper Body Dressing/Undressing Upper body dressing   What is the patient wearing?: Hospital gown     Pull over shirt/dress - Perfomed by patient: Thread/unthread right sleeve, Thread/unthread left sleeve Pull over shirt/dress - Perfomed by helper: Put head through opening, Pull shirt over trunk        Upper body assist Assist Level: Set up      Lower Body Dressing/Undressing Lower body dressing   What is the patient wearing?: Non-skid slipper socks     Pants- Performed by patient: Thread/unthread left pants leg Pants- Performed by helper: Thread/unthread right pants leg, Pull pants up/down   Non-skid slipper socks- Performed by helper: Don/doff right sock, Don/doff left sock                  Lower body assist Assist for lower body dressing: (patient performed 20%, total assist)  Toileting Toileting   Toileting steps completed by patient: Adjust clothing prior to toileting, Performs perineal hygiene, Adjust clothing after toileting(per Leann Nickles-Wright, NT) Toileting steps completed by helper: Adjust clothing prior to toileting, Performs perineal hygiene(No clothing to adjust post toileting)    Toileting assist  Assist level: Supervision or verbal cues   Transfers Chair/bed transfer   Chair/bed transfer method: Stand pivot Chair/bed transfer assist level: Touching or steadying assistance (Pt > 75%) Chair/bed transfer assistive device: Armrests, Patent attorneyWalker     Locomotion Ambulation     Max distance: 2680ft Assist level: Touching or steadying assistance (Pt > 75%)   Wheelchair   Type: Manual Max wheelchair distance: 10850ft Assist Level: Supervision or verbal cues  Cognition Comprehension Comprehension assist level: Follows complex conversation/direction with extra time/assistive device  Expression Expression assist level: Expresses complex ideas: With extra time/assistive device  Social Interaction Social Interaction assist level: Interacts appropriately 90% of the time - Needs monitoring or encouragement for participation or interaction.  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 Cervical collar as directed  Cont CIR 2. DVT Prophylaxis/Anticoagulation/bilateral pulmonary emboli/bilateral posterior tibial peroneal vein ZOX:[id number]:Lovenox 60 mg every 12 hours x1 week then begin Eliquis 5 mg twice daily if no bleeding.   -proceed with lovenox today(see #14) 3. Pain Management:Baclofen 10 mg twice daily, Flexeril and oxycodone as needed 4. Mood:Provide emotional support 5. Neuropsych: This patientiscapable of making decisions on hisown behalf. 6. Skin/Wound Care:Appreciate WOC input for moisture associated sacral wound 7. Fluids/Electrolytes/Nutrition:Routine in and outs  BMP within acceptable range on 7/31 8.Neurogenic bowel and bladder. Patient now with  loose stool  -Hold Colace, Imodium as needed 9.Hypertension. Cozaar 25 mg daily.   Reasonable control 8/3 10.Morbid obesity. BMI 45.29. Dietary follow-up 11.History of pernicious anemia with ABLA  Hemoglobin 10.4 on 8/1  Labs ordered for Monday 12. Thrombocytopenia  Platelets 103 on 8/1  Discussed with lab/pharmacy for workup for Lovenox induced thrombocytopenia - less likely  HIT within normal limits on 7/31  Hemoccult negative on 8/1 but positive this morning, loose stool  -recheck CBC today 13. Hypoalbuminemia  Supplement initiated on 8/1 14. Sleep disturbance  Possibly associated with PTSD  ECG reviewed, with borderline QT prolongation  Started restoril 7.5 daily at bedtime and consider further medication changes if necessary  Improved sleep patterns over weekend 15. Loose stool  -colace stopped. D/c sugar protein supp  -begin probiotic  -change protonix to pepcid  -prn imodium  -see above  LOS (Days) 5 A FACE TO FACE EVALUATION WAS PERFORMED  Ranelle OysterZachary T Cassidie Veiga 04/16/2018 7:14 AM

## 2018-04-16 NOTE — Progress Notes (Signed)
Physical Therapy Session Note  Patient Details  Name: [name] [name] MRN: [id number] Date of Birth: [birth year]  Today's Date: 04/16/2018 PT Concurrent Time: 1415-1515 PT Concurrent Time Calculation (min): 60 min  Short Term Goals: Week 1:  PT Short Term Goal 1 (Week 1): Pt will perform bed mobility with supervision assist  PT Short Term Goal 2 (Week 1): Pt will ambulate 16500ft with min assist  PT Short Term Goal 3 (Week 1): Pt will perform sit<>stand with min assist consistently PT Short Term Goal 4 (Week 1): Pt will initiate stair training   Skilled Therapeutic Interventions/Progress Updates:    Pt supine in bed upon PT arrival, agreeable to therapy tx and denies pain. Pt transferred to EOB with supervision and performed stand pivot to w/c with min assist. Pt transported to the gym. Pt used UE arm bike x 5 minutes alternating forwards/backwards on resistance level 3. Pt ambulated 2 x 175 ft this session with RW and CGA, working on endurance and activity tolerance, decreased overall gait speed. Pt performed x 10 sit<>stands from w/c without UE support for emphasis on LE strengthening. Pt performed 2 x 20 toe taps on 6 inch step with UE support for balance working on B LE hip flexor strengthening. Pt transported back to room and left seated with needs in reach.   Therapy Documentation Precautions:  Precautions Precautions: Fall Required Braces or Orthoses: Cervical Brace Cervical Brace: Hard collar Restrictions Weight Bearing Restrictions: No Other Position/Activity Restrictions: cervical collar can be donned in sitting, removed for showering, and removed while in bed per orders   See Function Navigator for Current Functional Status.   Therapy/Group: Individual Therapy  Cresenciano GenreEmily van Schagen, PT, DPT 04/16/2018, 12:58 PM

## 2018-04-17 ENCOUNTER — Inpatient Hospital Stay (HOSPITAL_COMMUNITY): Admitting: Physical Therapy

## 2018-04-17 ENCOUNTER — Inpatient Hospital Stay (HOSPITAL_COMMUNITY): Admitting: Occupational Therapy

## 2018-04-17 LAB — BASIC METABOLIC PANEL
Anion gap: 6 (ref 5–15)
BUN: 22 mg/dL — AB (ref 6–20)
CHLORIDE: 107 mmol/L (ref 98–111)
CO2: 28 mmol/L (ref 22–32)
CREATININE: 1.08 mg/dL (ref 0.61–1.24)
Calcium: 9 mg/dL (ref 8.9–10.3)
GFR calc Af Amer: >60 mL/min (ref >60)
GFR calc non Af Amer: >60 mL/min (ref >60)
GLUCOSE: 132 mg/dL — AB (ref 70–99)
POTASSIUM: 4.2 mmol/L (ref 3.5–5.1)
Sodium: 141 mmol/L (ref 135–145)

## 2018-04-17 LAB — CBC WITH DIFFERENTIAL/PLATELET
Basophils Absolute: 0 10*3/uL (ref 0.0–0.1)
Basophils Relative: 0 %
EOS PCT: 0 %
Eosinophils Absolute: 0 10*3/uL (ref 0.0–0.7)
HEMATOCRIT: 33.3 % — AB (ref 39.0–52.0)
Hemoglobin: 10.5 g/dL — ABNORMAL LOW (ref 13.0–17.0)
LYMPHS ABS: 1 10*3/uL (ref 0.7–4.0)
Lymphocytes Relative: 19 %
MCH: 29.9 pg (ref 26.0–34.0)
MCHC: 31.5 g/dL (ref 30.0–36.0)
MCV: 94.9 fL (ref 78.0–100.0)
MONOS PCT: 7 %
Monocytes Absolute: 0.4 10*3/uL (ref 0.1–1.0)
NEUTROS ABS: 3.7 10*3/uL (ref 1.7–7.7)
Neutrophils Relative %: 74 %
Platelets: 115 10*3/uL — ABNORMAL LOW (ref 150–400)
RBC: 3.51 MIL/uL — ABNORMAL LOW (ref 4.22–5.81)
RDW: 13.8 % (ref 11.5–15.5)
WBC: 5.1 10*3/uL (ref 4.0–10.5)

## 2018-04-17 MED ORDER — DEXAMETHASONE 2 MG PO TABS
2.0000 mg | ORAL_TABLET | Freq: Two times a day (BID) | ORAL | Status: DC
  Administered 2018-04-17 – 2018-04-19 (×5): 2 mg via ORAL
  Filled 2018-04-17 (×6): qty 1

## 2018-04-17 MED ORDER — CYANOCOBALAMIN 5000 MCG/ML SL LIQD
5000.0000 ug | Freq: Every day | SUBLINGUAL | Status: DC
  Administered 2018-04-17 – 2018-04-25 (×9): 5000 ug via SUBLINGUAL

## 2018-04-17 NOTE — Progress Notes (Signed)
Physical Therapy Session Note  Patient Details  Name: [name] [name] MRN: [id number] Date of Birth: 11/06/1965  Today's Date: 04/17/2018 PT Individual Time: 0800-0855 PT Individual Time Calculation (min): 55 min   Short Term Goals: Week 1:  PT Short Term Goal 1 (Week 1): Pt will perform bed mobility with supervision assist  PT Short Term Goal 2 (Week 1): Pt will ambulate 100ft with min assist  PT Short Term Goal 3 (Week 1): Pt will perform sit<>stand with min assist consistently PT Short Term Goal 4 (Week 1): Pt will initiate stair training   Skilled Therapeutic Interventions/Progress Updates:    Pt reports no pain prior to session indicating inc swelling and generalized weakness to R LE> LLE. Pt propels w/c from room> day room for 75 feet, supervision, and performs stand pivot from w/c>NuStep with CGA. Pt performs 5 min on NuStep level 5 and progresses to 5 min on level 8 indicating 14 on Borg Scale working on improving endurance. Pt then propels w/c 150 feet from day room> treatment gym supervision. Pt ambulates 360 feet with RW and CGA for endurance and strengthening during functional mobility while performing dual cognitive task counting backwards by 3 from 100 for inc. Challenge.Pt performs stand pivot transfer from w/c> couch using RW and CGA performing x8 STS from lower surface to facilitate LE strengthening on more compliant surface with no AD and CGA. Pt then performs seated LE strengthening including hip flexion with 3# wt to LLE and no wt to R LE for 3x15 reps. PT propelled w/c back to room for time management and performs stand pivot from w/c>bed with CGA using RW. EOB> supine requires min A for legs due to fatigue. Pt left in supine with tray table and call bell in reach and all needs met.  Therapy Documentation Precautions:  Precautions Precautions: Fall Required Braces or Orthoses: Cervical Brace Cervical Brace: Hard collar Restrictions Weight Bearing Restrictions: No Other  Position/Activity Restrictions: cervical collar can be donned in sitting, removed for showering, and removed while in bed per orders  See Function Navigator for Current Functional Status.   Therapy/Group: Individual Therapy    04/17/2018, 10:02 AM  

## 2018-04-17 NOTE — Progress Notes (Signed)
Physical Therapy Session Note  Patient Details  Name: [name] [name] MRN: [id number] Date of Birth: December 21, 1965  Today's Date: 04/17/2018 PT Individual Time: 0800-0855 PT Individual Time Calculation (min): 55 min   Short Term Goals: Week 1:  PT Short Term Goal 1 (Week 1): Pt will perform bed mobility with supervision assist  PT Short Term Goal 2 (Week 1): Pt will ambulate 16900ft with min assist  PT Short Term Goal 3 (Week 1): Pt will perform sit<>stand with min assist consistently PT Short Term Goal 4 (Week 1): Pt will initiate stair training   Skilled Therapeutic Interventions/Progress Updates: Pt received seated in bed, denies pain and agreeable to treatment. Supine>sit with S and HOB elevated. Stand pivot no AD bed>w/c and w/c <>mat table min guard. Standing balance with gastroc stretch on small red wedge while performing BUE pipe tree for focus on coordination and fine motor control; cues for glute activation, upright posture during task. Standing heel raises 2x10 with BUE support on RW. Sit <>stand 2x5 reps no UE pushing to stand with S. Gait x180' with RW and min guard, slow speed, trendelenburg pattern noted. Forward/backward walking with L hall rail for reduced reliance on UEs, LE ROM/strengthening and coordination. Pt reports RLE "done" and reports it will "lock up" and he will fall if he does any more. Returned to room totalA: remained seated in w/c, all needs in reach.      Therapy Documentation Precautions:  Precautions Precautions: Fall Required Braces or Orthoses: Cervical Brace Cervical Brace: Hard collar Restrictions Weight Bearing Restrictions: No Other Position/Activity Restrictions: cervical collar can be donned in sitting, removed for showering, and removed while in bed per orders   See Function Navigator for Current Functional Status.   Therapy/Group: Individual Therapy  Harlon Dittylizabeth J Halleigh Comes 04/17/2018, 9:06 AM

## 2018-04-17 NOTE — Progress Notes (Signed)
Occupational Therapy Session Note  Patient Details  Name: [name] [name] Kinne MRN: [id number] Date of Birth: 06-Mar-1966  Today's Date: 04/17/2018 OT Individual Time: 1305-1400 OT Individual Time Calculation (min): 55 min    Short Term Goals: Week 1:  OT Short Term Goal 1 (Week 1): Pt will complete LB bathing with min assist sit to stand.   OT Short Term Goal 2 (Week 1): Pt completed LB dressing with min assist sit to stand with AE.   OT Short Term Goal 3 (Week 1): Pt completed toilet transfer with min assist using the RW and elevated toilet. OT Short Term Goal 4 (Week 1): Pt will complete  tub shower transfer with min assist using the RW for support.   Skilled Therapeutic Interventions/Progress Updates:    Pt seen for OT ADL bathing/dressing session. Pt in supine upon arrival, complaints of fatigue from previous tx sessions though denying pain and willing to participate in therapy as able.  He transferred to EOB mod I using hospital bed functions. Ambulated throughout session with RW and close supervision, VCs for RW management in functional context. He completed toileting task with steadying assist and assist required for thorughness following BM. He transitioned to sitting on tub transfer bench to bathe, using LH sponge to wash LEs. He declined donning regular clothing, opting for hospital gown due to hx of bowel urgency and incontinency. He was able to manipulate small buttons on hospital gown in order to fasten sleeves of gown.  Pt returned to supine at end of session, awaiting RN to change sacral dressing.   Therapy Documentation Precautions:  Precautions Precautions: Fall Required Braces or Orthoses: Cervical Brace Cervical Brace: Hard collar Restrictions Weight Bearing Restrictions: No Other Position/Activity Restrictions: cervical collar can be donned in sitting, removed for showering, and removed while in bed per orders  See Function Navigator for Current Functional  Status.   Therapy/Group: Individual Therapy  Cleve Paolillo L 04/17/2018, 7:18 AM

## 2018-04-17 NOTE — Progress Notes (Signed)
[place] PHYSICAL MEDICINE & REHABILITATION     PROGRESS NOTE  Subjective/Complaints:  Patient seen sitting up in bed this morning. He states he slept well overnight. He states he had a good weekend and is happy about the fact that he was able to ambulate 350 feet. He states he hasn't some loose stools over the weekend but that improved with medication changes. He also notes that his Lovenox was DC'd.  ROS: Denies CP, SOB, N/V/D   Objective: Vital Signs: Blood pressure 124/86, pulse (!) 52, temperature 99.5 F (37.5 C), temperature source Oral, resp. rate 18, weight 117.5 kg (259 lb 0.7 oz), SpO2 100 %. No results found. Recent Labs    04/16/18 0737  WBC 6.5  HGB 11.4*  HCT 36.6*  PLT 134*   No results for input(s): NA, K, CL, GLUCOSE, BUN, CREATININE, CALCIUM in the last 72 hours.  Invalid input(s): CO CBG (last 3)  No results for input(s): GLUCAP in the last 72 hours.  Wt Readings from Last 3 Encounters:  04/12/18 117.5 kg (259 lb 0.7 oz)  03/23/18 (!) 139.1 kg (306 lb 10.6 oz)    Physical Exam:  BP 124/86 (BP Location: Left Arm)   Pulse (!) 52 Comment: RN notifed  Temp 99.5 F (37.5 C) (Oral)   Resp 18   Wt 117.5 kg (259 lb 0.7 oz) Comment: weight recorded after bed was zeroed  SpO2 100%   BMI 38.25 kg/m  Constitutional: No distress . Vital signs reviewed. HENT: Normocephalic.  Atraumatic. Eyes: EOMI. No discharge. Cardiovascular: RRR. No JVD. Respiratory: CTA Bilaterally. Normal effort. GI: BS +. Non-distended. Musculoskeletal: He edema(bilateral lower extremities)and tenderness, improving.  Neurological: He isalertand oriented  Motor:  RUE: 4/5 prox to distal (slowly improving)  LUE: 4+/5 proximal to distal (unchanged) RLE: 3/5 hip flexion, 4-/5 knee extension, 5/5 ADF (stable) LLE: 3+/5 hip flexion, 4/5 knee extension, 5/5 ankle dorsiflexion (stable) Sensation diminished to light touch right hand and foot, improving.  Skin:Incision site  c/d/i Sacral wound not examined today  Assessment/Plan: 1. Functional deficits secondary to cervical myelopathy with quadriparesis which require 3+ hours per day of interdisciplinary therapy in a comprehensive inpatient rehab setting. Physiatrist is providing close team supervision and 24 hour management of active medical problems listed below. Physiatrist and rehab team continue to assess barriers to discharge/monitor patient progress toward functional and medical goals.  Function:  Bathing Bathing position   Position: Shower  Bathing parts Body parts bathed by patient: Right arm, Left arm, Chest, Abdomen, Front perineal area, Right upper leg, Left upper leg, Right lower leg, Left lower leg Body parts bathed by helper: Buttocks, Back  Bathing assist Assist Level: Assistive device, Touching or steadying assistance(Pt > 75%)(LH sponge)      Upper Body Dressing/Undressing Upper body dressing   What is the patient wearing?: Hospital gown     Pull over shirt/dress - Perfomed by patient: Thread/unthread right sleeve, Thread/unthread left sleeve Pull over shirt/dress - Perfomed by helper: Put head through opening, Pull shirt over trunk        Upper body assist Assist Level: Set up      Lower Body Dressing/Undressing Lower body dressing   What is the patient wearing?: Non-skid slipper socks     Pants- Performed by patient: Thread/unthread left pants leg Pants- Performed by helper: Thread/unthread right pants leg, Pull pants up/down   Non-skid slipper socks- Performed by helper: Don/doff right sock, Don/doff left sock  Lower body assist Assist for lower body dressing: (patient performed 20%, total assist)      Toileting Toileting   Toileting steps completed by patient: Adjust clothing prior to toileting, Performs perineal hygiene, Adjust clothing after toileting Toileting steps completed by helper: Performs perineal hygiene    Toileting assist Assist  level: Touching or steadying assistance (Pt.75%)   Transfers Chair/bed transfer   Chair/bed transfer method: Stand pivot Chair/bed transfer assist level: Touching or steadying assistance (Pt > 75%) Chair/bed transfer assistive device: Armrests, Patent attorney     Max distance: 175 ft Assist level: Touching or steadying assistance (Pt > 75%)   Wheelchair   Type: Manual Max wheelchair distance: 64ft Assist Level: Supervision or verbal cues  Cognition Comprehension Comprehension assist level: Follows complex conversation/direction with extra time/assistive device  Expression Expression assist level: Expresses complex ideas: With extra time/assistive device  Social Interaction Social Interaction assist level: Interacts appropriately 90% of the time - Needs monitoring or encouragement for participation or interaction.  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 Cervical collar as directed  Cont CIR   Weekend notes reviewed  Decadron decreased to 2mg  BID on 8/5 2. DVT Prophylaxis/Anticoagulation/bilateral pulmonary emboli/bilateral posterior tibial peroneal vein WUJ:[id number] 60 mg every 12 hours x1 week then begin Eliquis 5 mg twice daily if no bleeding.   Resume Lovenox 3. Pain Management:Baclofen 10 mg twice daily, Flexeril and oxycodone as needed 4. Mood:Provide emotional support 5. Neuropsych: This patientiscapable of making decisions on hisown behalf. 6. Skin/Wound Care:Appreciate WOC input for moisture associated sacral wound 7. Fluids/Electrolytes/Nutrition:Routine in and outs  BMP within acceptable range on 7/31  Labs  ordered for tomorrow 8.Neurogenic bowel and bladder. Patient now with loose stool  -Hold Colace, Imodium as needed 9.Hypertension. Cozaar 25 mg daily.   Controlled on 8/5 10.Morbid obesity. BMI 45.29. Dietary follow-up 11.History of pernicious anemia with ABLA  Hemoglobin 11.4 on 8/4  Labs pending for today 12. Thrombocytopenia  Platelets 134 on 8/4  Discussed with lab/pharmacy for workup for Lovenox induced thrombocytopenia - less likely  HIT within normal limits on 7/31  Hemoccult negative on 8/1, positive on 8/3, negative again on 8/4  Labs pending for today 13. Hypoalbuminemia  Supplement initiated on 8/1 14. Sleep disturbance  Possibly associated with PTSD  ECG reviewed, with borderline QT prolongation  Cont restoril 7.5 daily  15. Loose stool  Colace stopped. D/c sugar protein supp  Cont probiotic  Changed protonix to pepcid  Prn imodium  LOS (Days) 6 A FACE TO FACE EVALUATION WAS PERFORMED  Naoko Diperna Karis Juba 04/17/2018 7:59 AM

## 2018-04-18 ENCOUNTER — Inpatient Hospital Stay (HOSPITAL_COMMUNITY): Admitting: Physical Therapy

## 2018-04-18 ENCOUNTER — Inpatient Hospital Stay (HOSPITAL_COMMUNITY): Admitting: Occupational Therapy

## 2018-04-18 NOTE — Progress Notes (Signed)
Occupational Therapy Session Note  Patient Details  Name: [name] [name] MRN: [id number] Date of Birth: 1966-03-12  Today's Date: 04/18/2018 OT Individual Time: 1300-1400 OT Individual Time Calculation (min): 60 min   Skilled Therapeutic Interventions/Progress Updates:    1:1 Attempted to see pt before lunch for scheduled session but pt decided to combined our two sessions into one hr after lunch due to frequent toileting needs.  Pt in bed when arrived. Discussed at length d/c plan and goals. Discussed with pt and PA about current status with blood thinners.  Participated in bathing at shower level, toileting and dressing (only LB and hospital gown). Pt did require A to rinse bottom but able to wash it.  Donned shorts and encouraged this with ongoing skin breakdown (to prevent friction on bed pad). Handed off pt to next therapist.   Therapy Documentation Precautions:  Precautions Precautions: Fall Required Braces or Orthoses: Cervical Brace Cervical Brace: Hard collar Restrictions Weight Bearing Restrictions: No Other Position/Activity Restrictions: cervical collar can be donned in sitting, removed for showering, and removed while in bed per orders Pain:  no c/o pain  See Function Navigator for Current Functional Status.   Therapy/Group: Individual Therapy  Roney MansSmith, [facility] 04/18/2018, 3:47 PM

## 2018-04-18 NOTE — Progress Notes (Signed)
Occupational Therapy Weekly Progress Note  Patient Details  Name: [name] [name] MRN: [id number] Date of Birth: 1966/03/22  Beginning of progress report period: April 12, 2018 End of progress report period: April 18, 2018  Today's Date: 04/18/2018 OT Individual Time: 1400-1500 OT Individual Time Calculation (min): 60 min    Patient has met 3 of 4 short term goals.  Pt is making steady progress towards OT goals. He cont to be limited by B LE weakness and edema impacting his functional mobility and activity tolerance. He is completing functional transfers at [place] level. Uses AE to assist with LB bathing/dressing.   He has refused donning of LE clothing due to bowel urgency/ incontentcy. Will cont to address in prep for d/c home next week.   Patient continues to demonstrate the following deficits: muscle weakness and muscle paralysis, decreased cardiorespiratoy endurance, decreased coordination and decreased standing balance, decreased postural control and decreased balance strategies and therefore will continue to benefit from skilled OT intervention to enhance overall performance with BADL and Reduce care partner burden.  Patient progressing toward long term goals..  Continue plan of care.  OT Short Term Goals Week 1:  OT Short Term Goal 1 (Week 1): Pt will complete LB bathing with min assist sit to stand.   OT Short Term Goal 1 - Progress (Week 1): Met OT Short Term Goal 2 (Week 1): Pt completed LB dressing with min assist sit to stand with AE.   OT Short Term Goal 2 - Progress (Week 1): Other (comment)(Pt refusing LB dressing due to inconsistent contentcy with bowels) OT Short Term Goal 3 (Week 1): Pt completed toilet transfer with min assist using the RW and elevated toilet. OT Short Term Goal 3 - Progress (Week 1): Met OT Short Term Goal 4 (Week 1): Pt will complete  tub shower transfer with min assist using the RW for support.  OT Short Term Goal 4 - Progress (Week 1):  Met Week 2:  OT Short Term Goal 1 (Week 2): STG=LTG due to LOS  Skilled Therapeutic Interventions/Progress Updates:    Pt seen for OT session focusing on ADL re-training, education, functional transfers and fine motor coordination. Pt sitting EOB upon arrival with hand off from PT. Pt agreeable to tx session and denying pain. Donned TED hose seated EOB and pt able to use reacher and sock aid to don B non-skid socks. He independently recalled technique for use of sock aid.  Changed posterior cervical neck brace pads (anterior ones not available at session, placed order for new ones). Education and demonstration provided for technique of changing pad as well as cleaning instructions.  Pt taken to ADL apartment total A in w/c for time and energy conservation. Ambulated throughout apartment with close supervision using RW. Completed simulated tub/shower transfer utilizing tub bench, Completed with min A following demonstration and education for technique of transfer.  Pt then completed fine motor activity at table top level. Activity focusing on pincer grasp and transition/translation of objects in hands. Completed on R/L sides, pt voicing increased challenge with R hand, however, demonstrated ability to use B hands at functional level mod I.  Pt returned to room at end of session, requested to stay up in w/c to visit with family. All needs in reach and family present.   Therapy Documentation Precautions:  Precautions Precautions: Fall Required Braces or Orthoses: Cervical Brace Cervical Brace: Hard collar Restrictions Weight Bearing Restrictions: No Other Position/Activity Restrictions: cervical collar can be donned in sitting, removed  for showering, and removed while in bed per orders  See Function Navigator for Current Functional Status.   Therapy/Group: Individual Therapy  Mattheo Swindle L 04/18/2018, 7:11 AM

## 2018-04-18 NOTE — Progress Notes (Signed)
RN was called in the room because some bloody stain was noticed by therapist on the bed's pad.After witnessed it,RN notify Deatra InaDan Angiulli PAC. Keep monitoring pt. Closely and assessing his needs.

## 2018-04-18 NOTE — Progress Notes (Signed)
[facility] PHYSICAL MEDICINE & REHABILITATION     PROGRESS NOTE  Subjective/Complaints:  Patient seen sitting up in bed this morning. He states he did not sleep well overnight. Loose stools. He states it is related to his Lovenox. He is not aware that Imodium was available to him when necessary.  ROS: +loose stools. Denies CP, SOB, N/V/D   Objective: Vital Signs: Blood pressure 132/83, pulse 60, temperature 98 F (36.7 C), temperature source Axillary, resp. rate 18, weight 117.5 kg (259 lb 0.7 oz), SpO2 100 %. No results found. Recent Labs    04/16/18 0737 04/17/18 0735  WBC 6.5 5.1  HGB 11.4* 10.5*  HCT 36.6* 33.3*  PLT 134* 115*   Recent Labs    04/17/18 0735  NA 141  K 4.2  CL 107  GLUCOSE 132*  BUN 22*  CREATININE 1.08  CALCIUM 9.0   CBG (last 3)  No results for input(s): GLUCAP in the last 72 hours.  Wt Readings from Last 3 Encounters:  04/12/18 117.5 kg (259 lb 0.7 oz)  03/23/18 (!) 139.1 kg (306 lb 10.6 oz)    Physical Exam:  BP 132/83 (BP Location: Right Arm)   Pulse 60   Temp 98 F (36.7 C) (Axillary)   Resp 18   Wt 117.5 kg (259 lb 0.7 oz) Comment: weight recorded after bed was zeroed  SpO2 100%   BMI 38.25 kg/m  Constitutional: No distress . Vital signs reviewed. HENT: Normocephalic.  Atraumatic. Eyes: EOMI. No discharge. Cardiovascular: RRR. No JVD. Respiratory: CTA bilaterally. Normal effort. GI: BS +. Non-distended. Musculoskeletal: He edema(bilateral lower extremities)and tenderness, stable.  Neurological: He isalertand oriented  Motor:  RUE: 4/5 prox to distal (stable)  LUE: 4+/5 proximal to distal (stable) RLE: 3/5 hip flexion, 4-/5 knee extension, 5/5 ADF (stable) LLE: 3+/5 hip flexion, 4/5 knee extension, 5/5 ankle dorsiflexion (stable) Sensation diminished to light touch right hand and foot, improving.  Skin:Incision site c/d/i Sacral wound not examined today  Assessment/Plan: 1. Functional deficits secondary to cervical  myelopathy with quadriparesis which require 3+ hours per day of interdisciplinary therapy in a comprehensive inpatient rehab setting. Physiatrist is providing close team supervision and 24 hour management of active medical problems listed below. Physiatrist and rehab team continue to assess barriers to discharge/monitor patient progress toward functional and medical goals.  Function:  Bathing Bathing position   Position: Shower  Bathing parts Body parts bathed by patient: Right arm, Left arm, Chest, Abdomen, Front perineal area, Right upper leg, Left upper leg, Right lower leg, Left lower leg Body parts bathed by helper: Buttocks, Back  Bathing assist Assist Level: Assistive device, Touching or steadying assistance(Pt > 75%)(LH sponge) Assistive Device Comment: lh sponge    Upper Body Dressing/Undressing Upper body dressing   What is the patient wearing?: Hospital gown     Pull over shirt/dress - Perfomed by patient: Thread/unthread right sleeve, Thread/unthread left sleeve Pull over shirt/dress - Perfomed by helper: Put head through opening, Pull shirt over trunk        Upper body assist Assist Level: Set up      Lower Body Dressing/Undressing Lower body dressing   What is the patient wearing?: Hospital Gown     Pants- Performed by patient: Thread/unthread left pants leg Pants- Performed by helper: Thread/unthread right pants leg, Pull pants up/down   Non-skid slipper socks- Performed by helper: Don/doff right sock, Don/doff left sock  Lower body assist Assist for lower body dressing: (patient performed 20%, total assist)      Toileting Toileting   Toileting steps completed by patient: Adjust clothing prior to toileting, Adjust clothing after toileting Toileting steps completed by helper: Performs perineal hygiene Toileting Assistive Devices: Grab bar or rail  Toileting assist Assist level: Supervision or verbal cues   Transfers Chair/bed  transfer   Chair/bed transfer method: Stand pivot Chair/bed transfer assist level: Touching or steadying assistance (Pt > 75%) Chair/bed transfer assistive device: Armrests, Patent attorneyWalker     Locomotion Ambulation     Max distance: 360 Assist level: Touching or steadying assistance (Pt > 75%)   Wheelchair   Type: Manual Max wheelchair distance: 150 Assist Level: Supervision or verbal cues  Cognition Comprehension Comprehension assist level: Follows complex conversation/direction with extra time/assistive device  Expression Expression assist level: Expresses complex 90% of the time/cues < 10% of the time  Social Interaction Social Interaction assist level: Interacts appropriately 90% of the time - Needs monitoring or encouragement for participation or interaction.  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 Cervical collar as directed  Cont CIR  Decadron decreased to 2mg  BID on 8/5 2. DVT Prophylaxis/Anticoagulation/bilateral pulmonary emboli/bilateral posterior tibial peroneal vein NWG:[id number]:Lovenox 60 mg every 12 hours x1 week then begin Eliquis 5 mg twice daily if no bleeding, plan to transition tomorrow pending labs.   Patient notes loose stools with Lovenox, encouraged when necessary Imodium 3. Pain Management:Baclofen 10 mg twice daily, Flexeril and oxycodone as needed 4. Mood:Provide emotional support 5. Neuropsych: This patientiscapable of making decisions on hisown behalf. 6. Skin/Wound Care:Appreciate WOC input for moisture associated sacral wound 7. Fluids/Electrolytes/Nutrition:Routine in and outs  BMP within acceptable range on  8/5 8.Neurogenic bowel and bladder. Patient now with loose stool  -Hold Colace, Imodium as needed 9.Hypertension. Cozaar 25 mg daily.   Controlled on 8/6 10.Morbid obesity. BMI 45.29. Dietary follow-up 11.History of pernicious anemia with ABLA  Hemoglobin 10.5 on 8/5 12. Thrombocytopenia  Platelets 115 on 8/5  Discussed with lab/pharmacy for workup for Lovenox induced thrombocytopenia - less likely  HIT within normal limits on 7/31  Hemoccult negative on 8/1, positive on 8/3, negative again on 8/4 13. Hypoalbuminemia  Supplement initiated on 8/1 14. Sleep disturbance  Possibly associated with PTSD  ECG reviewed, with borderline QT prolongation  Cont restoril 7.5 daily  15. Loose stool  Colace stopped. D/c sugar protein supp  Cont probiotic  Changed protonix to pepcid  Prn imodium  LOS (Days) 7 A FACE TO FACE EVALUATION WAS PERFORMED  Xian Apostol Karis Jubanil Tarnesha Ulloa 04/18/2018 8:45 AM

## 2018-04-18 NOTE — Progress Notes (Signed)
Physical Therapy Session Note  Patient Details  Name: [name] [name] MRN: [id number] Date of Birth: 02/07/1966  Today's Date: 04/18/2018 PT Individual Time: 0950-1100 PT Individual Time Calculation (min): 70 min   Short Term Goals: Week 1:  PT Short Term Goal 1 (Week 1): Pt will perform bed mobility with supervision assist  PT Short Term Goal 2 (Week 1): Pt will ambulate 100ft with min assist  PT Short Term Goal 3 (Week 1): Pt will perform sit<>stand with min assist consistently PT Short Term Goal 4 (Week 1): Pt will initiate stair training   Skilled Therapeutic Interventions/Progress Updates:  Pt presented in bed requesting to stay in room for session due to consistent diarrhea this morning. Pt agreeable to perform supine and sitting therex in bed. Pt participated in following activities: ankle pumps, quad sets, glute sets, hip abd/add with 1#, hip add isometrics, SAQ with 1#, manually resisted leg press, AA SLR. All activities performed to fatigue approx 30 reps ea bilaterally with rests in between. Pt performed supine to sit with HOB raised to 45degrees with supervision. Noted that pt had episode of incontinence. Pt performed sit to stand with RW with min guard to allow PTA to perform peri-care. Pt transferred to w/c with RW CGA to allow PTA to change sheets. Pt performed hip flexion and LAQ to fatigue while in w/c with 1# wt. Pt returned to bed in same manner as prior and pt was able to performed sit to supine with bed flat and CGA with use of bed rail. Pt positioned to comfort and left with bed alarm on and needs met. Nsg notified of pt request for another dose of imodium.      Therapy Documentation Precautions:  Precautions Precautions: Fall Required Braces or Orthoses: Cervical Brace Cervical Brace: Hard collar Restrictions Weight Bearing Restrictions: No Other Position/Activity Restrictions: cervical collar can be donned in sitting, removed for showering, and removed while in bed per  orders   See Function Navigator for Current Functional Status.   Therapy/Group: Individual Therapy      , PTA  04/18/2018, 12:07 PM  

## 2018-04-19 ENCOUNTER — Encounter (HOSPITAL_COMMUNITY): Admitting: Psychology

## 2018-04-19 ENCOUNTER — Inpatient Hospital Stay (HOSPITAL_COMMUNITY): Admitting: Physical Therapy

## 2018-04-19 ENCOUNTER — Inpatient Hospital Stay (HOSPITAL_COMMUNITY): Admitting: Occupational Therapy

## 2018-04-19 LAB — CBC WITH DIFFERENTIAL/PLATELET
ABS IMMATURE GRANULOCYTES: 0 10*3/uL (ref 0.0–0.1)
Basophils Absolute: 0 10*3/uL (ref 0.0–0.1)
Basophils Relative: 0 %
Eosinophils Absolute: 0 10*3/uL (ref 0.0–0.7)
Eosinophils Relative: 1 %
HEMATOCRIT: 32.6 % — AB (ref 39.0–52.0)
Hemoglobin: 10.2 g/dL — ABNORMAL LOW (ref 13.0–17.0)
IMMATURE GRANULOCYTES: 0 %
LYMPHS ABS: 1.3 10*3/uL (ref 0.7–4.0)
LYMPHS PCT: 31 %
MCH: 29.7 pg (ref 26.0–34.0)
MCHC: 31.3 g/dL (ref 30.0–36.0)
MCV: 95 fL (ref 78.0–100.0)
MONOS PCT: 9 %
Monocytes Absolute: 0.4 10*3/uL (ref 0.1–1.0)
NEUTROS ABS: 2.6 10*3/uL (ref 1.7–7.7)
NEUTROS PCT: 59 %
Platelets: 141 10*3/uL — ABNORMAL LOW (ref 150–400)
RBC: 3.43 MIL/uL — AB (ref 4.22–5.81)
RDW: 14.1 % (ref 11.5–15.5)
WBC: 4.4 10*3/uL (ref 4.0–10.5)

## 2018-04-19 MED ORDER — ASPIRIN EC 81 MG PO TBEC
81.0000 mg | DELAYED_RELEASE_TABLET | Freq: Every day | ORAL | Status: DC
  Administered 2018-04-20 – 2018-05-02 (×13): 81 mg via ORAL
  Filled 2018-04-19 (×13): qty 1

## 2018-04-19 MED ORDER — APIXABAN 5 MG PO TABS
5.0000 mg | ORAL_TABLET | Freq: Two times a day (BID) | ORAL | Status: DC
  Administered 2018-04-19 – 2018-05-02 (×27): 5 mg via ORAL
  Filled 2018-04-19 (×26): qty 1

## 2018-04-19 MED ORDER — ASPIRIN EC 81 MG PO TBEC: 81.0000 mg | DELAYED_RELEASE_TABLET | Freq: Every day | ORAL | Status: DC

## 2018-04-19 NOTE — Progress Notes (Signed)
Physical Therapy Session Note  Patient Details  Name: [name] [name] MRN: [id number] Date of Birth: 26-Oct-1965  Today's Date: 04/19/2018 PT Individual Time: 1001-1100 PT Individual Time Calculation (min): 59 min   Short Term Goals: Week 1:  PT Short Term Goal 1 (Week 1): Pt will perform bed mobility with supervision assist  PT Short Term Goal 2 (Week 1): Pt will ambulate 16000ft with min assist  PT Short Term Goal 3 (Week 1): Pt will perform sit<>stand with min assist consistently PT Short Term Goal 4 (Week 1): Pt will initiate stair training   Skilled Therapeutic Interventions/Progress Updates:  Pt presented in w/c agreeable to therapy. Session focused on ambulation, stair training and therex. Pt denied pain prior to session. Pt ambulated to rehab gym CGA with verbal cues for increasing B foot clearance particularly on LLE as noted increased dragging of L foot. Pt particpated in curb training with RW and was able to perform 4in curb with CGA and proper sequencing. Ascended/descended x 8 steps with B rails, CGA with step to pattern  Leading with L foot. Discussed pt's home set up upon d/c. Noted to have 4 steps to enter garage with R rail only. Adv pt will practice x 4 steps with R rail at next session.  Pt participated in standing therex for BLE strengthening. Including hip abd/add, HS curls, heel rises, hip flexion all 2 x 10 bilaterally. Pt required intermittent cues for increasing R quad activation when FWBing on RLE and noted increased fatigue with R hip flexion requiring increased rest. Pt ambulated back to room with RW with improved carryover of B foot clearance and stood at toilet to perform urinary void with single UE support. Pt returned to bed and left sitting at EOB with call bell within reach and neuropsych present.      Therapy Documentation Precautions:  Precautions Precautions: Fall Required Braces or Orthoses: Cervical Brace Cervical Brace: Hard collar Restrictions Weight  Bearing Restrictions: No Other Position/Activity Restrictions: cervical collar can be donned in sitting, removed for showering, and removed while in bed per orders General:   Vital Signs:  Pain: Pain Assessment Pain Score: 0-No pain   See Function Navigator for Current Functional Status.   Therapy/Group: Individual Therapy  [name] Drum  [place] [name], PTA  04/19/2018, 12:08 PM

## 2018-04-19 NOTE — Patient Care Conference (Signed)
Inpatient RehabilitationTeam Conference and Plan of Care Update Date: 04/19/2018   Time: 2:35 PM    Patient Name: [name] [name]      Medical Record Number: [id number]  Date of Birth: 24-Jun-1966 Sex: Male         Room/Bed: 4W19C/4W19C-01 Payor Info: Payor: TRICARE / Plan: TRICARE EAST / Product Type: *No Product type* /    Admitting Diagnosis: Cervical Myelopathy  Admit Date/Time:  04/11/2018  6:01 PM Admission Comments: No comment available   Primary Diagnosis:  <principal problem not specified> Principal Problem: <principal problem not specified>  Patient Active Problem List   Diagnosis Date Noted  . Hypoalbuminemia due to protein-calorie malnutrition (HCC)   . PTSD (post-traumatic stress disorder)   . Sleep disorder   . Anasarca   . Thrombocytopenia (HCC)   . Morbid obesity (HCC)   . Acute blood loss anemia   . Benign essential HTN   . Cervical myelopathy (HCC) 04/11/2018  . Pressure injury of skin 04/10/2018  . Acute deep vein thrombosis (DVT) of lower extremity (HCC)   . Bleeding   . Pulmonary embolism (HCC)bilateral, failed heparin IVC filter placed7/16 03/29/2018  . Preop cardiovascular exam   . Acute deep vein thrombosis (DVT) of right lower extremity (HCC)   . Myelopathy (HCC) 03/22/2018    Expected Discharge Date: Expected Discharge Date: 04/26/18  Team Members Present: Physician leading conference: Dr. Maryla Morrow Social Worker Present: Amada Jupiter, LCSW Nurse Present: Kennyth Arnold, RN PT Present: Grier Rocher, PT;Rosita Dechalus, PTA OT Present: Perrin Maltese, OT SLP Present: Jackalyn Lombard, SLP PPS Coordinator present : Tora Duck, RN, CRRN     Current Status/Progress Goal Weekly Team Focus  Medical   Right upper extremity weakness and bilateral lower extremity weakness secondary to OPLL/ cervical stenosis with resultant cervical myelopathy status post C3-C5 ACDF 03/24/2018.  Cervical collar as directed  Improve mobility, transfers, self-care, plts,  bleeding, PE/DVT  See above   Bowel/Bladder   Cont of bowel and bladder with occausional leakage.   obtain appropiate tolieting or assistance with tolieting to prevent any breakdowns.   Encourage tolieting at least every 2 to 4 hours with mod assistance.    Swallow/Nutrition/ Hydration             ADL's   Supervision seated bathing, close supervision-CGA functional transfers, supervision dressing using AE  Supervision overall  ADL/IADL re-training, functional standing balance/endurance, d/c planning, neuro re-ed   Mobility   CGA to minA bed mobiltiy, CGA transfers, CGA gait up to 32ft with RW, CGA stairs 2 and 1 rail with step to pattern  supervision overall  endurance, strengthening, balance, sd/c planning   Communication             Safety/Cognition/ Behavioral Observations            Pain   C/o BLE tight and throbbing.   Maintain a pain rate of 3 or below.   Manage and treat pain as needed.    Skin   Cont treatment to open area on butt as prescribe.   Area to heal properatly without infection.   Keep area clean with MD recommendation and change dressing as needed.     Rehab Goals Patient on target to meet rehab goals: Yes *See Care Plan and progress notes for long and short-term goals.     Barriers to Discharge  Current Status/Progress Possible Resolutions Date Resolved   Physician    Wound Care;Medical stability;Weight  Bleeding  See above  Therapies, optimize  BP meds, follow Hb, follow platelets, transition to oral anticoag      Nursing                  PT                    OT                  SLP                SW                Discharge Planning/Teaching Needs:  Plan to d/[place] home [place], [place]      Team Discussion:  Continue to monitor Hgb and platelets;  Transitioned to eliquis today.  Cont b/b. Making good progress with OT;  More weakness on right and addressing hand dexterity.  Min assist sit-stand.  CGA with steps and amb.  Goals for  supervision overall.  May be able to d/c earlier and will make decision over the next couple of days.  Revisions to Treatment Plan:  NA    Continued Need for Acute Rehabilitation Level of Care: The patient requires daily medical management by a physician with specialized training in physical medicine and rehabilitation for the following conditions: Daily direction of a multidisciplinary physical rehabilitation program to ensure safe treatment while eliciting the highest outcome that is of practical value to the patient.: Yes Daily medical management of patient stability for increased activity during participation in an intensive rehabilitation regime.: Yes Daily analysis of laboratory values and/or radiology reports with any subsequent need for medication adjustment of medical intervention for : Post surgical problems;Neurological problems;Blood pressure problems;Other  Anaysha Andre 04/19/2018, 3:21 PM

## 2018-04-19 NOTE — Progress Notes (Signed)
Occupational Therapy Session Note  Patient Details  Name: [name] [name] MRN: [id number] Date of Birth: 09/27/1965  Today's Date: 04/19/2018 OT Individual Time: [id number] OT Individual Time Calculation (min): 57 min    Short Term Goals: Week 2:  OT Short Term Goal 1 (Week 2): STG=LTG due to LOS  Skilled Therapeutic Interventions/Progress Updates:    Pt completed toilet transfer as well as bathing and dressing during session.  Min assist for transfer from the EOB to the wide 3:1 over the toilet.  Max assist for toilet hygiene secondary to bowel incontinence before making it to the toilet.  He then transferred to the shower bench with min assist.  Bathing with min assist sit to stand with use of the LH sponge for washing his back and lower legs.  He uses the LUE to assist the RUE at the elbow when attempting to wash the left shoulder.  Dressing sit to stand at the sink.  Supervision for donning shirt and for donning shorts over feet.  Min assist for sit to stand to pull them up over the hips as well.  Therapist assisted with donning TEDs and he used the wide sockaide with setup for donning gripper socks.  Grooming tasks with setup for brushing his teeth from wheelchair level.  Finished session with pt sitting in wheelchair at bedside with call button and phone in reach.    Therapy Documentation Precautions:  Precautions Precautions: Fall Required Braces or Orthoses: Cervical Brace Cervical Brace: Hard collar Restrictions Weight Bearing Restrictions: No Other Position/Activity Restrictions: cervical collar can be donned in sitting, removed for showering, and removed while in bed per orders  Pain: Pain Assessment Pain Score: 0-No pain ADL: See Function Navigator for Current Functional Status.   Therapy/Group: Individual Therapy  Kassidi Elza OTR/L 04/19/2018, 8:58 AM

## 2018-04-19 NOTE — Progress Notes (Signed)
Physical Therapy Weekly Progress Note  Patient Details  Name: [name] [name] MRN: [id number] Date of Birth: 1966-04-24  Beginning of progress report period: April 12, 2018 End of progress report period: April 19, 2018  Today's Date: 04/19/2018 PT Individual Time: 1303-1403   PT Individual Time Calculation (min): 60 min   Patient has met 4 of 4 short term goals.  Pt has demonstrated consistent improvements during current course of therapy. Pt has demonstrated improvements in balance, BLE strength, and endurance with noted improvement in ambulation, transfers, and stairs. Pt is consistently at a CGA to close supervision level at transfers, stairs, and gait and will continue to progress with stair training with use of 1 rail.   Patient continues to demonstrate the following deficits muscle weakness and impaired timing and sequencing, unbalanced muscle activation and decreased coordination and therefore will continue to benefit from skilled PT intervention to increase functional independence with mobility.  Patient progressing toward long term goals..  Continue plan of care.  PT Short Term Goals Week 1:  PT Short Term Goal 1 (Week 1): Pt will perform bed mobility with supervision assist  PT Short Term Goal 1 - Progress (Week 1): Met PT Short Term Goal 2 (Week 1): Pt will ambulate 155f with min assist  PT Short Term Goal 2 - Progress (Week 1): Met PT Short Term Goal 3 (Week 1): Pt will perform sit<>stand with min assist consistently PT Short Term Goal 3 - Progress (Week 1): Met PT Short Term Goal 4 (Week 1): Pt will initiate stair training  PT Short Term Goal 4 - Progress (Week 1): Met Week 2:       Skilled Therapeutic Interventions/Progress Updates: Pt presented in bed agreeable to therapy. Session focused on endurance and stair training. Pt performed bed mobility with use of features with supervision and increased time. Pt requesting to use toilet, ambulating to bathroom with RW and CGA.  Performed urinary void in standing with single UE support. Pt returned to w/c for energy conservation and transported to rehab gym. Performed stair training with single R rail with step to pattern and CGA. Pt did require increased time and verbal cues when descending to step wider with RLE to allow LLE clearance when stepping down. Pt participated in 4in forward and lateral step ups in parallel bars for RLE strengthening 2 x 5. Verbal cues required for decreased R knee hyperextension when stepping up. Participated in bed mobility at mat entering bed on R side and required minA for RLE placement supine to sit. Pt required verbal cues for increased use of BUE for truncal support when performing supine to sit transfer. Pt then transported to day room and performed NuStep L8 x 15 min for endurance and overall conditioning. All sit to/from stand transfers throughout session performed CGA fading to supervision level. Pt remained in w/c at end of session with call bell within reach and current needs met.   Therapy Documentation Precautions:  Precautions Precautions: Fall Required Braces or Orthoses: Cervical Brace Cervical Brace: Hard collar Restrictions Weight Bearing Restrictions: No Other Position/Activity Restrictions: cervical collar can be donned in sitting, removed for showering, and removed while in bed per orders      See Function Navigator for Current Functional Status.  Therapy/Group: Individual Therapy  Rosita DeChalus  Rosita DeChalus, PTA  04/19/2018, 2:07 PM

## 2018-04-19 NOTE — Progress Notes (Signed)
[facility]ICINE & REHABILITATION     PROGRESS NOTE  Subjective/Complaints:  Patient seen sitting up in bed this morning. He states he slept fairly overnight. He notes improvement in loose stools.  ROS: denies CP, SOB, N/V/D   Objective: Vital Signs: Blood pressure 112/76, pulse (!) 50, temperature 98.2 F (36.8 C), temperature source Oral, resp. rate 17, weight 117.5 kg (259 lb 0.7 oz), SpO2 100 %. No results found. Recent Labs    04/17/18 0735 04/19/18 0534  WBC 5.1 4.4  HGB 10.5* 10.2*  HCT 33.3* 32.6*  PLT 115* 141*   Recent Labs    04/17/18 0735  NA 141  K 4.2  CL 107  GLUCOSE 132*  BUN 22*  CREATININE 1.08  CALCIUM 9.0   CBG (last 3)  No results for input(s): GLUCAP in the last 72 hours.  Wt Readings from Last 3 Encounters:  04/12/18 117.5 kg (259 lb 0.7 oz)  03/23/18 (!) 139.1 kg (306 lb 10.6 oz)    Physical Exam:  BP 112/76 (BP Location: Right Arm)   Pulse (!) 50   Temp 98.2 F (36.8 C) (Oral)   Resp 17   Wt 117.5 kg (259 lb 0.7 oz) Comment: weight recorded after bed was zeroed  SpO2 100%   BMI 38.25 kg/m  Constitutional: No distress . Vital signs reviewed. HENT: Normocephalic.  Atraumatic. Eyes: EOMI. No discharge. Cardiovascular: RRR. No JVD. Respiratory: CTA bilaterally. Normal effort. GI: BS +. Non-distended. Musculoskeletal: He edema(bilateral lower extremities)and tenderness, unchanged.  Neurological: He isalertand oriented  Motor:  RUE: 4/5 prox to distal (unchanged)  LUE: 4+/5 proximal to distal (unchanged) RLE: 3/5 hip flexion, 4-/5 knee extension, 5/5 ADF (stable) LLE: 3+/5 hip flexion, 4/5 knee extension, 5/5 ankle dorsiflexion (stable) Sensation diminished to light touch right hand and foot, improving.  Skin:Incision site c/d/i Sacral wound not examined today  Assessment/Plan: 1. Functional deficits secondary to cervical myelopathy with quadriparesis which require 3+ hours per day of interdisciplinary therapy in  a comprehensive inpatient rehab setting. Physiatrist is providing close team supervision and 24 hour management of active medical problems listed below. Physiatrist and rehab team continue to assess barriers to discharge/monitor patient progress toward functional and medical goals.  Function:  Bathing Bathing position   Position: Shower  Bathing parts Body parts bathed by patient: Right arm, Left arm, Chest, Abdomen, Front perineal area, Right upper leg, Left upper leg, Right lower leg, Left lower leg, Back Body parts bathed by helper: Buttocks  Bathing assist Assist Level: Supervision or verbal cues, Set up Assistive Device Comment: lh sponge    Upper Body Dressing/Undressing Upper body dressing   What is the patient wearing?: Pull over shirt/dress     Pull over shirt/dress - Perfomed by patient: Thread/unthread right sleeve, Thread/unthread left sleeve, Put head through opening, Pull shirt over trunk Pull over shirt/dress - Perfomed by helper: Put head through opening, Pull shirt over trunk        Upper body assist Assist Level: Set up   Set up : To obtain clothing/put away  Lower Body Dressing/Undressing Lower body dressing   What is the patient wearing?: Non-skid slipper socks, Ted Hose, Pants     Pants- Performed by patient: Thread/unthread right pants leg, Thread/unthread left pants leg, Pull pants up/down Pants- Performed by helper: Thread/unthread right pants leg, Pull pants up/down Non-skid slipper socks- Performed by patient: Don/doff right sock, Don/doff left sock(Sock aid and reacher) Non-skid slipper socks- Performed by helper: Don/doff right sock, Don/doff  left sock(sockaide)               TED Hose - Performed by helper: Don/doff right TED hose, Don/doff left TED hose  Lower body assist Assist for lower body dressing: Supervision or verbal cues      Toileting Toileting   Toileting steps completed by patient: Adjust clothing prior to toileting, Adjust  clothing after toileting Toileting steps completed by helper: Performs perineal hygiene Toileting Assistive Devices: Grab bar or rail  Toileting assist Assist level: Supervision or verbal cues   Transfers Chair/bed transfer   Chair/bed transfer method: Stand pivot Chair/bed transfer assist level: Touching or steadying assistance (Pt > 75%) Chair/bed transfer assistive device: Armrests, Patent attorney     Max distance: 360 Assist level: Touching or steadying assistance (Pt > 75%)   Wheelchair   Type: Manual Max wheelchair distance: 150 Assist Level: Supervision or verbal cues  Cognition Comprehension Comprehension assist level: Follows complex conversation/direction with extra time/assistive device  Expression Expression assist level: Expresses complex ideas: With extra time/assistive device  Social Interaction Social Interaction assist level: Interacts appropriately with others with medication or extra time (anti-anxiety, antidepressant).  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 Cervical collar as directed  Cont CIR  Decadron decreased to 2mg  BID on 8/5 2. DVT Prophylaxis/Anticoagulation/bilateral pulmonary emboli/bilateral posterior tibial peroneal vein ZOX:WRUEAVW 60 mg every 12 hours x1 week, transitionws to Eliquis 5 mg twice a day on 8/7, will need to monitor closely for bleeding  Patient notes loose stools with Lovenox, encouraged when necessary Imodium 3. Pain Management:Baclofen 10 mg twice daily, Flexeril and oxycodone as needed 4. Mood:Provide emotional support 5. Neuropsych: This patientiscapable of making  decisions on hisown behalf. 6. Skin/Wound Care:Appreciate WOC input for moisture associated sacral wound 7. Fluids/Electrolytes/Nutrition:Routine in and outs  BMP within acceptable range on 8/5 8.Neurogenic bowel and bladder. Patient now with loose stool  -Hold Colace, Imodium as needed 9.Hypertension. Cozaar 25 mg daily.   Labile and 8/7 10.Morbid obesity. BMI 45.29. Dietary follow-up 11.History of pernicious anemia with ABLA  Hemoglobin 10.2 on 8/7  Labs ordered for tomorow 12. Thrombocytopenia  Platelets 141 on 8/7  Discussed with lab/pharmacy for workup for Lovenox induced thrombocytopenia - less likely  HIT within normal limits on 7/31  Hemoccult negative on 8/1, positive on 8/3, negative again on 8/4 13. Hypoalbuminemia  Supplement initiated on 8/1 14. Sleep disturbance  Possibly associated with PTSD  ECG reviewed, with borderline QT prolongation  Cont restoril 7.5 daily  15. Loose stool  Colace stopped. D/c sugar protein supp  Cont probiotic  Changed protonix to pepcid  Prn imodium  Improving  LOS (Days) 8 A FACE TO FACE EVALUATION WAS PERFORMED  Ankit Karis Juba 04/19/2018 8:59 AM

## 2018-04-19 NOTE — Consult Note (Signed)
Neuropsychological Consultation   Patient:   [name] [name]   DOB:   [birth year]  MR Number:  [id number]  [facility]  [facility] [address] [id number] [place] [id number] Dept: [phone] Loc: [phone]           Date of Service:   04/19/2018  Start Time:   11 AM End Time:   12 PM  Provider/Observer:  Arley PhenixJohn Yi Falletta, Psy.D.       Clinical Neuropsychologist       Billing Code/Service: [id number] Units  Chief Complaint:    [name] FreedCornell Vita is a 52 year old male with prior history of hypertension, pernicious anemia, morbid obesity, gout and history of low back pain.  Presented on 03/22/2018 with decrease in balance and numbness in his hand primarily on right side and dragging his right leg.  MRI back showed severe cord compression extending from C3-4 down to C4-5.  Underwent neurosurgery.  Patient has had DVT, PE then issues with bleeding after different blood thinners.  Patient is now improving but has intrusive fears, especially in the evenings, which was the times that he had difficulties post surgery.    Reason for Service:  [name] [name] was referred for neuropsychological consultation due to acute PTSD symptoms that developed during hospital course.  Below is the HPI for the current admission.    UVO:[id number]:[name] Arlineis a 52 year old right-handed male with history of hypertension, pernicious anemia, morbid obesity with BMI 45.29, gout as well as long-standing history of low back pain. Per chart review patient lives alone. Plans to stay with his parents on discharge and assistance as needed. One level home with 5 steps to entry. Presented 03/22/2018 with decrease in balance and numbness in his hand primarily on the right dragging of his right leg. He had a recent fall x2 where he fell struck his head neck. Patient also notes recent increase in constipation as well as bouts of urinary retention. Cranial CT  scan showed no acute intracranial process. MRI of the thoracic lumbar spine showed severe cord compression extending from C3-4 down to C4-5. Underwent anterior cervical corpectomy of C4, microscopic dissection and foraminotomies of the C4 and C5 nerve root 03/24/2018 per Dr. Wynetta Emeryram. Cervical collar placed postoperatively. Hospital course pain management. Rapid response 03/25/2018 after patient unresponsive after transferring from chair to bed heart rate in the 30s. Initially hypoxic during event placed on NRB and responded well after NS bolus of 250 cc. Patient again on 715 with increasing shortness of breath CT angiogram of the chest showed bilateral acute pulmonary emboli, occlusive within multiple segmental and subsegmental branches. Venous Dopplers positive for acute DVT involving bilateral posterior tibial and peroneal veins. Venous Dopplers upper extremities negative. Initially placed on intravenous heparin and transitioned to Coumadin. Cardiology services consulted for bradycardia. EKG sinus rhythm. Echocardiogram with ejection fraction of 55% grade 2 diastolic dysfunction. Underwent placement of IVC filter per interventional radiology 03/28/2018 as well as reexploration of anterior cervical wound following initial heparin anticoagulation for active oozing from site. Patient's heparin and Coumadin were discontinued due to sensitivity of anticoagulation and did receive vitamin K. Initial attempts to transition to Lovenox however increased oozing again from the site and Lovenox later discontinued. Follow-up oncology hematology services 04/07/2018 and Lovenox conservatively again initiated 40 mg daily. Plan was for Lovenox 40 mg daily x48 hours then changed to 60 mg every 12 hours x1 week and then again if no bleeding transition to Eliquis  5 mg twice daily. Close monitoring of hemoglobin hematocrit. Physical and occupational therapy evaluations completed with recommendations of physical medicine  rehab consult. Patient was admitted for a comprehensive rehab program.    Current Status:  The patient reports that he has continued PTSD symptoms related to both intrusive thoughts, particularly at night and nightmares that include thoughts of dying.  He reports that these symptoms are improving in both frequency and duration but intensity remains high.    Behavioral Observation: [name] [name]  presents as a 52 y.o.-year-old Right African American Male who appeared his stated age. his dress was Appropriate and he was Well Groomed and his manners were Appropriate to the situation.  his participation was indicative of Appropriate and Redirectable behaviors.  There were any physical disabilities noted.  he displayed an appropriate level of cooperation and motivation.     Interactions:    Active Appropriate and Redirectable  Attention:   abnormal and Patient distracted by internal preoccupations.  Memory:   within normal limits; recent and remote memory intact  Visuo-spatial:  not examined  Speech (Volume):  normal  Speech:   normal;   Thought Process:  Coherent, Relevant and Tangential  Though Content:  WNL; not suicidal and not homicidal  Orientation:   person, place, time/date and situation  Judgment:   Fair  Planning:   Fair  Affect:    Anxious  Mood:    Anxious  Insight:   Fair  Intelligence:   normal  Medical History:   Past Medical History:  Diagnosis Date  . Degenerative disc disease, lumbar   . Gout   . History of pernicious anemia 1980   Patient says he takes OTC iron supplements QD  . Hypertension    Psychiatric History:  Patient denies past history of depression, anxiety or PTSD symptoms.    Family Med/Psych History: History reviewed. No pertinent family history.  Risk of Suicide/Violence: low Patient denies SI or HI.  Impression/DX:  [name] [name] is a 52 year old male with prior history of hypertension, pernicious anemia, morbid obesity, gout and  history of low back pain.  Presented on 03/22/2018 with decrease in balance and numbness in his hand primarily on right side and dragging his right leg.  MRI back showed severe cord compression extending from C3-4 down to C4-5.  Underwent neurosurgery.  Patient has had DVT, PE then issues with bleeding after different blood thinners.  Patient is now improving but has intrusive fears, especially in the evenings, which was the times that he had difficulties post surgery.    The patient reports that he has continued PTSD symptoms related to both intrusive thoughts, particularly at night and nightmares that include thoughts of dying.  He reports that these symptoms are improving in both frequency and duration but intensity remains high.    Disposition/Plan:  Today, worked on issues related to acute PTSD and anxiety.  Worked on coping skills and understanding of the development of these symptoms and strategies to cope with them.    Diagnosis:     PTSD        Electronically Signed   _______________________ Arley Phenix, Psy.D.

## 2018-04-19 NOTE — Progress Notes (Signed)
Social Work Patient ID: [name] [name], male   DOB: January 05, 1966, 52 y.o.   MRN: [id number]  Have reviewed team conference with pt who is aware that team continues to plan toward 8/14 d/c date and supervision goals overall.  Discussed follow up services and, at [facility]  Will check with txs end of week to see if this is still recommended vs OP.  Continue to follow.  Lianne Carreto, LCSW

## 2018-04-19 NOTE — Progress Notes (Signed)
Physical Therapy Session Note  Patient Details  Name: [name] [name] MRN: [id number] Date of Birth: September 19, 1965  Today's Date: 04/19/2018 PT Individual Time: 1505-1530 PT Individual Time Calculation (min): 25 min   Short Term Goals: Week 1:  PT Short Term Goal 1 (Week 1): Pt will perform bed mobility with supervision assist  PT Short Term Goal 1 - Progress (Week 1): Met PT Short Term Goal 2 (Week 1): Pt will ambulate 154f with min assist  PT Short Term Goal 2 - Progress (Week 1): Met PT Short Term Goal 3 (Week 1): Pt will perform sit<>stand with min assist consistently PT Short Term Goal 3 - Progress (Week 1): Met PT Short Term Goal 4 (Week 1): Pt will initiate stair training  PT Short Term Goal 4 - Progress (Week 1): Met Week 2:    STG =LTG due to ELOS  Skilled Therapeutic Interventions/Progress Updates:   Pt received sitting in WC and agreeable to PT. Ambulatory transfer to bathroom for urination with supervision assist from PT. Standing to toilet for urination with supervision assist for safety. Gait training with RW supervision assist x 1583fon first bout and min assist x 15030fn second bout. Mild R knee instability noted on the R on second bout with mild knee buckle, but pt able to correct without increased assist. Sit<>stand with puch form thighs x 5 with GCA. Pt returned to room and performed ambulatory transfer to bed with RW and min assist for safety. Sit>supine completed with supervision assist, and left supine in bed with call bell in reach and all needs met.       Therapy Documentation Precautions:  Precautions Precautions: Fall Required Braces or Orthoses: Cervical Brace Cervical Brace: Hard collar Restrictions Weight Bearing Restrictions: No Other Position/Activity Restrictions: cervical collar can be donned in sitting, removed for showering, and removed while in bed per orders Vital Signs: Therapy Vitals Temp: 98.1 F (36.7 C) Temp Source: Oral Pulse Rate:  75 Resp: 17 BP: (!) 150/94 Patient Position (if appropriate): Sitting Oxygen Therapy SpO2: 99 % O2 Device: Room Air Pain:   denies See Function Navigator for Current Functional Status.   Therapy/Group: Individual Therapy  AusLorie Phenix7/2019, 5:27 PM

## 2018-04-20 ENCOUNTER — Inpatient Hospital Stay (HOSPITAL_COMMUNITY): Admitting: Physical Therapy

## 2018-04-20 ENCOUNTER — Inpatient Hospital Stay (HOSPITAL_COMMUNITY): Admitting: Occupational Therapy

## 2018-04-20 LAB — CBC WITH DIFFERENTIAL/PLATELET
ABS IMMATURE GRANULOCYTES: 0 10*3/uL (ref 0.0–0.1)
Basophils Absolute: 0 10*3/uL (ref 0.0–0.1)
Basophils Relative: 0 %
EOS ABS: 0.1 10*3/uL (ref 0.0–0.7)
Eosinophils Relative: 1 %
HEMATOCRIT: 33.9 % — AB (ref 39.0–52.0)
Hemoglobin: 10.5 g/dL — ABNORMAL LOW (ref 13.0–17.0)
IMMATURE GRANULOCYTES: 0 %
Lymphocytes Relative: 32 %
Lymphs Abs: 1.4 10*3/uL (ref 0.7–4.0)
MCH: 29.6 pg (ref 26.0–34.0)
MCHC: 31 g/dL (ref 30.0–36.0)
MCV: 95.5 fL (ref 78.0–100.0)
MONOS PCT: 13 %
Monocytes Absolute: 0.5 10*3/uL (ref 0.1–1.0)
NEUTROS ABS: 2.3 10*3/uL (ref 1.7–7.7)
NEUTROS PCT: 54 %
PLATELETS: 140 10*3/uL — AB (ref 150–400)
RBC: 3.55 MIL/uL — AB (ref 4.22–5.81)
RDW: 14 % (ref 11.5–15.5)
WBC: 4.2 10*3/uL (ref 4.0–10.5)

## 2018-04-20 MED ORDER — DEXAMETHASONE 2 MG PO TABS
2.0000 mg | ORAL_TABLET | Freq: Every day | ORAL | Status: DC
  Administered 2018-04-20 – 2018-04-24 (×5): 2 mg via ORAL
  Filled 2018-04-20 (×5): qty 1

## 2018-04-20 NOTE — Progress Notes (Signed)
Occupational Therapy Session Note  Patient Details  Name: [name] [name] MRN: [id number] Date of Birth: 02/04/1966  Today's Date: 04/20/2018 OT Individual Time: 1305-1404 OT Individual Time Calculation (min): 59 min    Short Term Goals: Week 2:  OT Short Term Goal 1 (Week 2): STG=LTG due to LOS  Skilled Therapeutic Interventions/Progress Updates:    Pt completed wheelchair mobility down to the ADL apartment with supervision to start session.  He then completed transfer into the tub/shower using the RW and the tub bench with min guard assist.  Decreased efficiency with lifting the LLE over the edge of the tub, but he was able to complete it without therapist assist after a couple of tries.  With sit to stand from the edge of the tub bench he needed min assist and then exhibited bowel incontinence.  Took pt back to the room for cleaning up BM with max assist for hygiene and mod assist for clothing management.  Finished session with transfer out to the wheelchair with completion of RUE shoulder flexion exercise AAROM for 1 set of 15 repetitions.    Therapy Documentation Precautions:  Precautions Precautions: Fall Required Braces or Orthoses: Cervical Brace Cervical Brace: Hard collar Restrictions Weight Bearing Restrictions: No Other Position/Activity Restrictions: cervical collar can be donned in sitting, removed for showering, and removed while in bed per orders  Pain: Pain Assessment Pain Score: 0-No pain ADL: See Function Navigator for Current Functional Status.   Therapy/Group: Individual Therapy  Meggie Laseter OTR/L 04/20/2018, 3:43 PM

## 2018-04-20 NOTE — Progress Notes (Signed)
Occupational Therapy Session Note  Patient Details  Name: [name] [name] MRN: [id number] Date of Birth: 10/08/1965  Today's Date: 04/20/2018 OT Individual Time: 0800-0902 OT Individual Time Calculation (min): 62 min    Short Term Goals: Week 2:  OT Short Term Goal 1 (Week 2): STG=LTG due to LOS  Skilled Therapeutic Interventions/Progress Updates:    Pt completed toileing, bathing, and dressing during session.  Min assist for functional transfers to and from the shower and toilet with min guard at times as he could complete sit to stand from the toilet with rail with supervision.  He was able to complete toilet hygiene as well as manage clothing with min assist as well.  He transferred over to the tub bench with min assist for bathing.  LH sponge used for washing hs lower legs and back.  Transferred out to the wheelchair for work on dressing where he donned his shorts and shirt with increased time and min assist.  Finished session with therapist assist to donn his TEDs and gripper socks.  Pt left in wheelchair with nursing present at end of session.    Therapy Documentation Precautions:  Precautions Precautions: Fall Required Braces or Orthoses: Cervical Brace Cervical Brace: Hard collar Restrictions Weight Bearing Restrictions: No Other Position/Activity Restrictions: cervical collar can be donned in sitting, removed for showering, and removed while in bed per orders  Pain: Pain Assessment Pain Scale: 0-10 Pain Score: 0-No pain ADL: See Function Navigator for Current Functional Status.   Therapy/Group: Individual Therapy  Gurpreet Mikhail OTR/L 04/20/2018, 12:20 PM

## 2018-04-20 NOTE — Progress Notes (Signed)
Physical Therapy Session Note  Patient Details  Name: [name] [name] MRN: [id number] Date of Birth: [birth year]  Today's Date: 04/20/2018 PT Individual Time: 1005-1100 AND 1430-1500 PT Individual Time Calculation (min): 55 min 30 min   Short Term Goals: Week 1:  PT Short Term Goal 1 (Week 1): Pt will perform bed mobility with supervision assist  PT Short Term Goal 1 - Progress (Week 1): Met PT Short Term Goal 2 (Week 1): Pt will ambulate 126f with min assist  PT Short Term Goal 2 - Progress (Week 1): Met PT Short Term Goal 3 (Week 1): Pt will perform sit<>stand with min assist consistently PT Short Term Goal 3 - Progress (Week 1): Met PT Short Term Goal 4 (Week 1): Pt will initiate stair training  PT Short Term Goal 4 - Progress (Week 1): Met  Skilled Therapeutic Interventions/Progress Updates:  Session 1  Pt received sitting in WC and agreeable to PT. [facility]   BLE strength training on stairs: forward step up x 8 BLE. Lateral step up x 5 BLE. BUE support and min. Sit<>stand without UE x 5 with supervision assist from PT. Hip abduction with from sitting position 2 x 10 BLE    dynamic gait trainnig with RW to step over 1 inch obstacle, weave through 4, cones and step over unstable surface.   Gait training with RW x 3023fwith supervision-min assist from PT. Cues from PT for for improved step width Bil to increase BOS and improve glute activation.   Pt returned to room and performed ambulatory transfer to bathroom for urnaiton. Ambultory transfer to bed with supervision assist and UE support on RW. Sit>supine completed with supervision assist and left supine in bed with call bell in reach and all needs met.    Session 2.  Pt received sitting in WC and agreeable to PT. PT instructed gait training x 15087fith supervision assist from PT. Min cues for increased step height to improve safety. Nustep reciprocal movement and endurance training. X 10 minutes. Stand  pivot transfer with supervision assist from PT. [facility] to bathroom. Supervision assist transfer to bed with supervision assist and RW. Sit>supine completed with supervision assist and bed rails, then left supine in bed with call bell in reach and all needs met.         Therapy Documentation Precautions:  Precautions Precautions: Fall Required Braces or Orthoses: Cervical Brace Cervical Brace: Hard collar Restrictions Weight Bearing Restrictions: No Other Position/Activity Restrictions: cervical collar can be donned in sitting, removed for showering, and removed while in bed per orders   Pain: Pain Assessment Pain Scale: 0-10 Pain Score: 0-No pain   See Function Navigator for Current Functional Status.   Therapy/Group: Individual Therapy  AusLorie Phenix8/2019, 12:18 PM

## 2018-04-20 NOTE — Progress Notes (Signed)
[facility] PHYSICAL MEDICINE & REHABILITATION     PROGRESS NOTE  Subjective/Complaints:  Patient seen lying in bed this morning. He states he slept better overnight. He states he is doing much better. He is happy about the transition from Lovenox to Eliquis.  ROS: Denies CP, SOB, N/V/D   Objective: Vital Signs: Blood pressure 123/73, pulse (!) 57, temperature 97.7 F (36.5 C), temperature source Oral, resp. rate 18, height 5\' 9"  (1.753 m), weight 117.5 kg, SpO2 98 %. No results found. Recent Labs    04/19/18 0534 04/20/18 0554  WBC 4.4 4.2  HGB 10.2* 10.5*  HCT 32.6* 33.9*  PLT 141* 140*   No results for input(s): NA, K, CL, GLUCOSE, BUN, CREATININE, CALCIUM in the last 72 hours.  Invalid input(s): CO CBG (last 3)  No results for input(s): GLUCAP in the last 72 hours.  Wt Readings from Last 3 Encounters:  04/12/18 117.5 kg  03/23/18 (!) 139.1 kg    Physical Exam:  BP 123/73 (BP Location: Right Arm)   Pulse (!) 57   Temp 97.7 F (36.5 C) (Oral)   Resp 18   Ht 5\' 9"  (1.753 m)   Wt 117.5 kg Comment: weight recorded after bed was zeroed  SpO2 98%   BMI 38.25 kg/m  Constitutional: No distress . Vital signs reviewed. HENT: Normocephalic.  Atraumatic. Eyes: EOMI. No discharge. Cardiovascular: RRR. No JVD. Respiratory: CTA bilaterally. Normal effort. GI: BS +. Non-distended. Musculoskeletal: He edema(bilateral lower extremities)and tenderness, stable.  Neurological: He isalertand oriented  Motor:  RUE: 4-4+/5 prox to distal  LUE: 4+/5 proximal to distal  RLE: 4-/5 hip flexion, 4/5 knee extension, 5/5 ADF  LLE: 4-/5 hip flexion, 4/5 knee extension, 5/5 ankle dorsiflexion  Skin:Incision site c/d/i Sacral wound not examined today  Assessment/Plan: 1. Functional deficits secondary to cervical myelopathy with quadriparesis which require 3+ hours per day of interdisciplinary therapy in a comprehensive inpatient rehab setting. Physiatrist is providing close team  supervision and 24 hour management of active medical problems listed below. Physiatrist and rehab team continue to assess barriers to discharge/monitor patient progress toward functional and medical goals.  Function:  Bathing Bathing position   Position: Shower  Bathing parts Body parts bathed by patient: Right arm, Left arm, Chest, Abdomen, Front perineal area, Right upper leg, Left upper leg, Right lower leg, Left lower leg, Back Body parts bathed by helper: Buttocks  Bathing assist Assist Level: Supervision or verbal cues, Set up Assistive Device Comment: lh sponge    Upper Body Dressing/Undressing Upper body dressing   What is the patient wearing?: Pull over shirt/dress     Pull over shirt/dress - Perfomed by patient: Thread/unthread right sleeve, Thread/unthread left sleeve, Put head through opening, Pull shirt over trunk Pull over shirt/dress - Perfomed by helper: Put head through opening, Pull shirt over trunk        Upper body assist Assist Level: Set up   Set up : To obtain clothing/put away  Lower Body Dressing/Undressing Lower body dressing   What is the patient wearing?: Non-skid slipper socks, Ted Hose, Pants     Pants- Performed by patient: Thread/unthread right pants leg, Thread/unthread left pants leg, Pull pants up/down Pants- Performed by helper: Thread/unthread right pants leg, Pull pants up/down Non-skid slipper socks- Performed by patient: Don/doff right sock, Don/doff left sock(Sock aid and reacher) Non-skid slipper socks- Performed by helper: Don/doff right sock, Don/doff left sock(sockaide)  TED Hose - Performed by helper: Don/doff right TED hose, Don/doff left TED hose  Lower body assist Assist for lower body dressing: Supervision or verbal cues      Toileting Toileting   Toileting steps completed by patient: Adjust clothing prior to toileting, Adjust clothing after toileting Toileting steps completed by helper: Performs perineal  hygiene Toileting Assistive Devices: Grab bar or rail  Toileting assist Assist level: Supervision or verbal cues   Transfers Chair/bed transfer   Chair/bed transfer method: Ambulatory Chair/bed transfer assist level: Touching or steadying assistance (Pt > 75%) Chair/bed transfer assistive device: Armrests, Patent attorney     Max distance: 150ft Assist level: Touching or steadying assistance (Pt > 75%)   Wheelchair   Type: Manual Max wheelchair distance: 150 Assist Level: Supervision or verbal cues  Cognition Comprehension Comprehension assist level: Follows complex conversation/direction with extra time/assistive device  Expression Expression assist level: Expresses complex ideas: With extra time/assistive device  Social Interaction Social Interaction assist level: Interacts appropriately with others with medication or extra time (anti-anxiety, antidepressant).  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 Cervical collar as directed  Cont CIR  Decadron decreased to 2mg  BID on 8/5, decreased to daily on 8/8 2. DVT Prophylaxis/Anticoagulation/bilateral pulmonary emboli/bilateral posterior tibial peroneal vein ZOX:WRUEAVW 60 mg every 12 hours x1 week, transitionws to Eliquis 5 mg twice a day on 8/7, will need to monitor closely for bleeding   3. Pain Management:Baclofen 10 mg twice daily, Flexeril and oxycodone as needed 4. Mood:Provide emotional support 5. Neuropsych: This patientiscapable of making decisions on hisown behalf. 6. Skin/Wound Care:Appreciate WOC input for moisture associated sacral wound 7.  Fluids/Electrolytes/Nutrition:Routine in and outs  BMP within acceptable range on 8/5 8.Neurogenic bowel and bladder. Patient now with loose stool  -Hold Colace, Imodium as needed 9.Hypertension. Cozaar 25 mg daily.   Controlled on 8/8 10.Morbid obesity. BMI 45.29. Dietary follow-up 11.History of pernicious anemia with ABLA  Hemoglobin 10.5 on 8/8 12. Thrombocytopenia  Platelets 140 on 8/8  Discussed with lab/pharmacy for workup for Lovenox induced thrombocytopenia - less likely  HIT within normal limits on 7/31  Hemoccult negative on 8/1, positive on 8/3, negative again on 8/4 13. Hypoalbuminemia  Supplement initiated on 8/1 14. Sleep disturbance  Possibly associated with PTSD  ECG reviewed, with borderline QT prolongation  Cont restoril 7.5 daily  15. Loose stool  Colace stopped. D/c sugar protein supp  Cont probiotic  Changed protonix to pepcid  Prn imodium  improved  LOS (Days) 9 A FACE TO FACE EVALUATION WAS PERFORMED  Icis Budreau Karis Juba 04/20/2018 9:00 AM

## 2018-04-21 ENCOUNTER — Inpatient Hospital Stay (HOSPITAL_COMMUNITY)

## 2018-04-21 ENCOUNTER — Inpatient Hospital Stay (HOSPITAL_COMMUNITY): Admitting: Physical Therapy

## 2018-04-21 NOTE — Progress Notes (Signed)
Physical Therapy Session Note  Patient Details  Name: [name] [name] MRN: [id number] Date of Birth: 1966-09-08  Today's Date: 04/21/2018 PT Individual [id number]   75 min   Short Term Goals: Week 1:  PT Short Term Goal 1 (Week 1): Pt will perform bed mobility with supervision assist  PT Short Term Goal 1 - Progress (Week 1): Met PT Short Term Goal 2 (Week 1): Pt will ambulate 133f with min assist  PT Short Term Goal 2 - Progress (Week 1): Met PT Short Term Goal 3 (Week 1): Pt will perform sit<>stand with min assist consistently PT Short Term Goal 3 - Progress (Week 1): Met PT Short Term Goal 4 (Week 1): Pt will initiate stair training  PT Short Term Goal 4 - Progress (Week 1): Met Week 2:  PT Short Term Goal 1 (Week 2): STG=LTg due to ELOS  Skilled Therapeutic Interventions/Progress Updates:   Pt received supine in bed and agreeable to PT. Supine>sit transfer with  Supervision assist assist and min cues for safety. Gait training in simulated community environment of hospital gift shop 2 x 1525fwith RW and supervision assist from PT.  WC mobility x 15094fith supervison and min cues from PT  For safety in turns and improve use of the RUE to maintain straight path. Standing therex: Forward step x 10 BLE with UE support, Lateral step 2 x 6 BLE with out UE support. Calf raises x15 with slight BUE support. Pt returned to room and performed ambulatory transfer to bed with supervision assist. Sit>supine completed with supervision assist and moderate use of bed rails. Pt  left supine in bed with call bell in reach and all needs met.        Therapy Documentation Precautions:  Precautions Precautions: Fall Required Braces or Orthoses: Cervical Brace Cervical Brace: Hard collar Restrictions Weight Bearing Restrictions: No Other Position/Activity Restrictions: cervical collar can be donned in sitting, removed for showering, and removed while in bed per orders Pain: Pain Assessment Pain  Scale: 0-10 Pain Score: 3  Pain Type: Acute pain Pain Location: Head Pain Descriptors / Indicators: Throbbing Pain Frequency: Intermittent Pain Onset: Gradual Patients Stated Pain Goal: 2 Pain Intervention(s): Medication (See eMAR)   See Function Navigator for Current Functional Status.   Therapy/Group: Individual Therapy  AusLorie Phenix9/2019, 2:12 PM

## 2018-04-21 NOTE — Progress Notes (Signed)
[facility] PH[facility]ICAL [facility]ICINE & REHABILITATION     PROGRESS NOTE  Subjective/Complaints:  Pt seen laying in bed this AM.  He states he slept well overnight. He notices improvement in strength today.  ROS: denies CP, SOB, N/V/D   Objective: Vital Signs: Blood pressure (!) 145/91, pulse 67, temperature 98.7 F (37.1 C), temperature source Oral, resp. rate 15, height 5\' 9"  (1.753 m), weight 117.5 kg, SpO2 100 %. No results found. Recent Labs    04/19/18 0534 04/20/18 0554  WBC 4.4 4.2  HGB 10.2* 10.5*  HCT 32.6* 33.9*  PLT 141* 140*   No results for input(s): NA, K, CL, GLUCOSE, BUN, CREATININE, CALCIUM in the last 72 hours.  Invalid input(s): CO CBG (last 3)  No results for input(s): GLUCAP in the last 72 hours.  Wt Readings from Last 3 Encounters:  04/12/18 117.5 kg  03/23/18 (!) 139.1 kg    Physical Exam:  BP (!) 145/91 (BP Location: Left Arm) Comment: rn notified  Pulse 67   Temp 98.7 F (37.1 C) (Oral)   Resp 15   Ht 5\' 9"  (1.753 m)   Wt 117.5 kg Comment: weight recorded after bed was zeroed  SpO2 100%   BMI 38.25 kg/m  Constitutional: No distress . Vital signs reviewed. HENT: Normocephalic.  Atraumatic. Eyes: EOMI. No discharge. Cardiovascular: RRR. No JVD. Respiratory: CTA  Bilaterally. Normal effort. GI: BS +. Non-distended. Musculoskeletal: He edema(bilateral lower extremities)and tenderness, improving.  Neurological: He isalertand oriented  Motor:  RUE: 4+/5 prox to distal  LUE: 5/5 proximal to distal  RLE: 4-/5 hip flexion, 4/5 knee extension, 5/5 ADF, improving  LLE: 4-/5 hip flexion, 4/5 knee extension, 5/5 ankle dorsiflexion, improving Skin:Incision site c/d/i Sacral wound not examined today  Assessment/Plan: 1. Functional deficits secondary to cervical myelopathy with quadriparesis which require 3+ hours per day of interdisciplinary therapy in a comprehensive inpatient rehab setting. Physiatrist is providing close team supervision and 24  hour management of active medical problems listed below. Physiatrist and rehab team continue to assess barriers to discharge/monitor patient progress toward functional and medical goals.  Function:  Bathing Bathing position   Position: Shower  Bathing parts Body parts bathed by patient: Right arm, Left arm, Chest, Abdomen, Front perineal area, Right upper leg, Left upper leg, Right lower leg, Left lower leg, Back, Buttocks Body parts bathed by helper: Buttocks  Bathing assist Assist Level: Touching or steadying assistance(Pt > 75%) Assistive Device Comment: lh sponge    Upper Body Dressing/Undressing Upper body dressing   What is the patient wearing?: Pull over shirt/dress     Pull over shirt/dress - Perfomed by patient: Thread/unthread right sleeve, Thread/unthread left sleeve, Put head through opening, Pull shirt over trunk Pull over shirt/dress - Perfomed by helper: Put head through opening, Pull shirt over trunk        Upper body assist Assist Level: Supervision or verbal cues   Set up : To obtain clothing/put away  Lower Body Dressing/Undressing Lower body dressing   What is the patient wearing?: Non-skid slipper socks, Ted Hose, Pants     Pants- Performed by patient: Thread/unthread right pants leg, Thread/unthread left pants leg, Pull pants up/down Pants- Performed by helper: Thread/unthread right pants leg, Pull pants up/down Non-skid slipper socks- Performed by patient: Don/doff right sock, Don/doff left sock Non-skid slipper socks- Performed by helper: Don/doff right sock, Don/doff left sock(sockaide)               TED Hose - Performed by helper:  Don/doff right TED hose, Don/doff left TED hose  Lower body assist Assist for lower body dressing: Touching or steadying assistance (Pt > 75%)      Toileting Toileting   Toileting steps completed by patient: Adjust clothing prior to toileting, Adjust clothing after toileting Toileting steps completed by helper:  Performs perineal hygiene Toileting Assistive Devices: Grab bar or rail  Toileting assist Assist level: Touching or steadying assistance (Pt.75%)   Transfers Chair/bed transfer   Chair/bed transfer method: Ambulatory Chair/bed transfer assist level: Touching or steadying assistance (Pt > 75%) Chair/bed transfer assistive device: Armrests, Patent attorneyWalker     Locomotion Ambulation     Max distance: [id number] Assist level: Touching or steadying assistance (Pt > 75%)   Wheelchair   Type: Manual Max wheelchair distance: 150 Assist Level: Supervision or verbal cues  Cognition Comprehension Comprehension assist level: Follows complex conversation/direction with extra time/assistive device  Expression Expression assist level: Expresses complex ideas: With extra time/assistive device  Social Interaction Social Interaction assist level: Interacts appropriately with others with medication or extra time (anti-anxiety, antidepressant).  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 Cervical collar as directed  Cont CIR  Decadron decreased to 2mg  BID on 8/5, decreased to daily on 8/8 2. DVT Prophylaxis/Anticoagulation/bilateral pulmonary emboli/bilateral posterior tibial peroneal vein ZOX:[id number]:Lovenox 60 mg every 12 hours x1 week, transitioned to Eliquis 5 mg twice a day on 8/7, will need to monitor closely for bleeding  Tolerating oral anticoagulation at present  3. Pain Management:Baclofen 10 mg twice daily, Flexeril and oxycodone as needed 4. Mood:Provide emotional support 5. Neuropsych: This patientiscapable of making decisions on hisown behalf. 6. Skin/Wound  Care:Appreciate WOC input for moisture associated sacral wound 7. Fluids/Electrolytes/Nutrition:Routine in and outs  BMP within acceptable range on 8/5 8.Neurogenic bowel and bladder. Patient now with loose stool  -Hold Colace, Imodium as needed 9.Hypertension./ Cozaar 25 mg daily.   Labile on 8/9 10.Morbid obesity. BMI 45.29. Dietary follow-up 11.History of pernicious anemia with ABLA  Hemoglobin 10.5 on 8/8  Labs ordered for Monday 12. Thrombocytopenia  Platelets 140 on 8/8  Labs ordered for Monday  Discussed with lab/pharmacy for workup for Lovenox induced thrombocytopenia - less likely  HIT within normal limits on 7/31  Hemoccult negative on 8/1, positive on 8/3, negative again on 8/4 13. Hypoalbuminemia  Supplement initiated on 8/1 14. Sleep disturbance  Possibly associated with PTSD  ECG reviewed, with borderline QT prolongation  Cont restoril 7.5 daily  15. Loose stool: Resolved  Colace stopped. D/c sugar protein supp  Cont probiotic  Changed protonix to pepcid  Prn imodium  LOS (Days) 10 A FACE TO FACE EVALUATION WAS PERFORMED  Ankit Karis Jubanil Patel 04/21/2018 8:55 AM

## 2018-04-21 NOTE — Progress Notes (Signed)
Occupational Therapy Session Note  Patient Details  Name: [name] [name] MRN: [id number] Date of Birth: Aug 16, 1966  Today's Date: 04/21/2018 OT Individual Time: 1500-1600 OT Individual Time Calculation (min): 60 min    Skilled Therapeutic Interventions/Progress Updates:    1;1. Pt agreeable to OT with c/o stiffness in back but no pain. Pt completes amublatory transfers with RW and VC for upright posture to stand over toilet to void bladder 2x during session and practice TTB transfer after OT lowered TTB per pr request. Pt able to reach down to feet after lowering TTB in simulated bathing. Pt requesting to go outside. Pt completes w/c propulsion >150 feet for BUE strengthening and endurance part way to outside courtyard. Pt completes 3x30 passes of basketball (chest and bounce pass) with VC for isometric hold at 80* shoulder flexion in between passes for BUE strengthening/endurance required for BADLs. Pt ambulates with RW with CGA over uneven surface, thresholds and rugs ~100 feet im prep for community mobility distances with VC for noticing RW caught on rug. Exited session with pt seated in bed, call light in reach and all needs met.   Therapy Documentation Precautions:  Precautions Precautions: Fall Required Braces or Orthoses: Cervical Brace Cervical Brace: Hard collar Restrictions Weight Bearing Restrictions: No Other Position/Activity Restrictions: cervical collar can be donned in sitting, removed for showering, and removed while in bed per orders  See Function Navigator for Current Functional Status.   Therapy/Group: Individual Therapy  Tonny Branch 04/21/2018, 4:08 PM

## 2018-04-21 NOTE — Progress Notes (Signed)
Occupational Therapy Session Note  Patient Details  Name: [name] FreedCornell Forgey MRN: [id number] Date of Birth: Dec 20, 1965  Today's Date: 04/21/2018 OT Group Time: 1130-1200 OT Group Time Calculation (min): 30 min  and Today's Date: 04/21/2018 OT Missed Time: 30 Minutes Missed Time Reason: Other (comment)(toileting)   Short Term Goals: Week 2:  OT Short Term Goal 1 (Week 2): STG=LTG due to LOS  Skilled Therapeutic Interventions/Progress Updates:    Pt missed 30 min skilled group OT d/t toileting and dressing with RN. Pt reports HA during session, and RN alerted. Pt participates in skilled therapeutic tx group of making a "no sew" blanket  to be fonated to the local animal shelter focusing on alternating  attention, BUE use, social participation, static standing with no UE support and fine motor coordination. Pt uses BUE to fold and measure lines, mark fabric with marker, cut strips of cloth with scissors, and tie knots on blanket. Pt declines standing d/t HA. Pt conversational throughout with other group members. Exited session with pt escorted back to room and set up with lunch  Therapy Documentation Precautions:  Precautions Precautions: Fall Required Braces or Orthoses: Cervical Brace Cervical Brace: Hard collar Restrictions Weight Bearing Restrictions: No Other Position/Activity Restrictions: cervical collar can be donned in sitting, removed for showering, and removed while in bed per orders General: General OT Amount of Missed Time: 30 Minutes  See Function Navigator for Current Functional Status.   Therapy/Group: Individual Therapy  [name] HaleStephanie M Laycie Schriner 04/21/2018, 12:42 PM

## 2018-04-21 NOTE — Plan of Care (Signed)
  Problem: SCI BOWEL ELIMINATION Goal: RH STG MANAGE BOWEL WITH ASSISTANCE Description STG Manage Bowel with min Assistance.  Outcome: Progressing   Problem: SCI BLADDER ELIMINATION Goal: RH STG MANAGE BLADDER WITH ASSISTANCE Description STG Manage Bladder With Min Assistance  Outcome: Progressing   Problem: RH SKIN INTEGRITY Goal: RH STG SKIN FREE OF INFECTION/BREAKDOWN Description Mod I  Outcome: Progressing   Problem: RH SAFETY Goal: RH STG ADHERE TO SAFETY PRECAUTIONS W/ASSISTANCE/DEVICE Description STG Adhere to Safety Precautions With min Assistance/Device.  Outcome: Progressing   Problem: RH PAIN MANAGEMENT Goal: RH STG PAIN MANAGED AT OR BELOW PT'S PAIN GOAL Description <2  Outcome: Progressing   

## 2018-04-22 ENCOUNTER — Inpatient Hospital Stay (HOSPITAL_COMMUNITY): Admitting: Physical Therapy

## 2018-04-22 ENCOUNTER — Inpatient Hospital Stay (HOSPITAL_COMMUNITY): Admitting: Occupational Therapy

## 2018-04-22 NOTE — Progress Notes (Signed)
Physical Therapy Session Note  Patient Details  Name: [name] [name] MRN: [id number] Date of Birth: 1965-11-24  Today's Date: 04/22/2018 PT Individual Time: 4037-5436 PT Individual Time Calculation (min): 30 min   Short Term Goals: Week 1:  PT Short Term Goal 1 (Week 1): Pt will perform bed mobility with supervision assist  PT Short Term Goal 1 - Progress (Week 1): Met PT Short Term Goal 2 (Week 1): Pt will ambulate 169f with min assist  PT Short Term Goal 2 - Progress (Week 1): Met PT Short Term Goal 3 (Week 1): Pt will perform sit<>stand with min assist consistently PT Short Term Goal 3 - Progress (Week 1): Met PT Short Term Goal 4 (Week 1): Pt will initiate stair training  PT Short Term Goal 4 - Progress (Week 1): Met Week 2:  PT Short Term Goal 1 (Week 2): STG=LTg due to ELOS  Skilled Therapeutic Interventions/Progress Updates:   Pt received semirecumbent in bed and agreeable to PT. Supine>sit transfer with supervision assist and min cues for use of rails. PT instructed pt in in ambulation to day room x 1248fwith supervision assist and UE support on RW. Standing balance training to perform reciprocal movement training to step on 2 inch step and 4 inch steps x 10 BLE each reciprocal ball kicks x 15 BLE in sitting and x 12 BLE in standing with UE support on RW. Patient returned to room and left sitting on toilet with call bell in reach and all needs met.         Therapy Documentation Precautions:  Precautions Precautions: Fall Required Braces or Orthoses: Cervical Brace Cervical Brace: Hard collar Restrictions Weight Bearing Restrictions: No Other Position/Activity Restrictions: cervical collar can be donned in sitting, removed for showering, and removed while in bed per orders    Vital Signs: Therapy Vitals Temp: 98.2 F (36.8 C) Temp Source: Oral Pulse Rate: 76 Resp: 17 BP: 121/79 Patient Position (if appropriate): Lying Oxygen Therapy SpO2: 99 % O2 Device: Room  Air Pain: 4/10 low back   See Function Navigator for Current Functional Status.   Therapy/Group: Individual Therapy  AuLorie Phenix/06/2018, 8:37 AM

## 2018-04-22 NOTE — Progress Notes (Signed)
Subjective. Patient up and ambulating.  No concerns.   Objective: BP 121/79 (BP Location: Right Wrist)   Pulse 76   Temp 98.2 F (36.8 C) (Oral)   Resp 17   Ht 5\' 9"  (1.753 m)   Wt 117.5 kg Comment: weight recorded after bed was zeroed  SpO2 99%   BMI 38.25 kg/m   Overweight male in no acute distress.  Ambulating.  He is wearing a neck brace.  No increased work of breathing.  Extremities without edema.  Assessment plan: Cervical myelopathy with quadriparesis.  Continue inpatient rehab.  On a steroid taper. 2.  DVT prophylaxis with Eliquis. Pain management: Continue current medications 4.  Neurogenic bowel and bladder currently not a concern. 5.  Hypertension blood pressure well controlled currently. 6.  Morbid obesity 7.  Anemia we will check labs on Monday 8.  Thrombocytopenia, mild we will check labs on Monday

## 2018-04-22 NOTE — Plan of Care (Signed)
  Problem: SCI BOWEL ELIMINATION Goal: RH STG MANAGE BOWEL WITH ASSISTANCE Description STG Manage Bowel with min Assistance.  Outcome: Progressing   Problem: SCI BLADDER ELIMINATION Goal: RH STG MANAGE BLADDER WITH ASSISTANCE Description STG Manage Bladder With Min Assistance  Outcome: Progressing   Problem: RH SKIN INTEGRITY Goal: RH STG SKIN FREE OF INFECTION/BREAKDOWN Description Mod I  Outcome: Progressing   Problem: RH SAFETY Goal: RH STG ADHERE TO SAFETY PRECAUTIONS W/ASSISTANCE/DEVICE Description STG Adhere to Safety Precautions With min Assistance/Device.  Outcome: Progressing   Problem: RH PAIN MANAGEMENT Goal: RH STG PAIN MANAGED AT OR BELOW PT'S PAIN GOAL Description <2  Outcome: Progressing   

## 2018-04-23 ENCOUNTER — Inpatient Hospital Stay (HOSPITAL_COMMUNITY): Admitting: *Deleted

## 2018-04-23 LAB — URINALYSIS, ROUTINE W REFLEX MICROSCOPIC
BACTERIA UA: NONE SEEN
Bilirubin Urine: NEGATIVE
GLUCOSE, UA: NEGATIVE mg/dL
KETONES UR: NEGATIVE mg/dL
LEUKOCYTES UA: NEGATIVE
Nitrite: NEGATIVE
PH: 5 (ref 5.0–8.0)
Protein, ur: NEGATIVE mg/dL
SPECIFIC GRAVITY, URINE: 1.024 (ref 1.005–1.030)

## 2018-04-23 NOTE — Progress Notes (Signed)
Physical Therapy Session Note  Patient Details  Name: [name] [name] MRN: [id number] Date of Birth: 07-07-1966  Today's Date: 04/23/2018 PT Missed Time: 45 Minutes Missed Time Reason: Patient ill (Comment);Pain  Attempted PT  PM visit, but pt c/o recurrent RLE pain behind the knee and weakness. Pt afraid that mobility with do more harm than good, declining tx at this time. RN aware and has called MD. RN reports pt with low grade fever. Continue per PT POC as able.     Tnia Anglada Virl CageyM  Aunesty Tyson, PT, DPT  04/23/2018, 3:21 PM

## 2018-04-23 NOTE — Progress Notes (Signed)
After OT session today pt complained of increased weakness to right leg. Pt stated that his leg had buckled when trying to get out of bed. Pt had very weak Dorsiflexion and plantarflexion to right foot but was unable to lift leg up off the bed. Pt complained of no numbness or tingling to right leg and Pt has no change in weakness of right arm. Pt is alert and oriented at this time and answers all questions appropriately. Pt does no complain of pain to the R leg while at rest.  MD on call notified. Will continue to monitor.

## 2018-04-23 NOTE — Progress Notes (Signed)
Occupational Therapy Session Note  Patient Details  Name: [name] [name] Sawaya MRN: [id number] Date of Birth: 1965/10/03  Today's Date: 04/23/2018 OT Individual Time: 0912-1004 OT Individual Time Calculation (min): 52 min   Skilled Therapeutic Interventions/Progress Updates:    Pt seen in AM and amenable to makeup therapy time. He wanted to use the restroom. OT assisted with Teds and pt donned gripper socks EOB with use of sock aide. Sit<stand with RW very challenging without Min-Mod A. Rt knee with persistent buckling. For safety, squat pivot completed<w/c<drop arm commode over toilet with Mod A. Pt able to achieve partial stand for OT to lower pants. Sit<stand in bariatric Stedy Mod A for pericare completion post BM. Max exertion from pt to achieve stand due to weak R LE. "It's just giving out- I don't know what's happening." [facility] and was repositioned for comfort. RN made aware of change in functional status. Pt left with all needs within reach at end of tx.   Therapy Documentation Precautions:  Precautions Precautions: Fall Required Braces or Orthoses: Cervical Brace Cervical Brace: Hard collar Restrictions Weight Bearing Restrictions: No Other Position/Activity Restrictions: cervical collar can be donned in sitting, removed for showering, and removed while in bed per orders Pain: No c/o pain during session  Pain Assessment Pain Scale: 0-10 Pain Score: 0-No pain ADL:      See Function Navigator for Current Functional Status.   Therapy/Group: Individual Therapy  Peretz Thieme A Treasa Bradshaw 04/23/2018, 12:43 PM

## 2018-04-23 NOTE — Progress Notes (Signed)
Pt's right leg continues to be weak. Pt complains of no pain at rest but complains of shooting pains in leg when RN touched the bottom of foot and knee. Pt also complains of shooting pains when he attempts to move his leg.

## 2018-04-23 NOTE — Progress Notes (Signed)
Pt has low grade fever that has increased slightly with administration of prn tylenol. Pt's temperature is 100.63F at this time. MD on call notified. Urinalysis ordered and collected. CBC scheduled for am. Will continue to monitor and will notify physician if temperature reaches 101 or higher.

## 2018-04-23 NOTE — Progress Notes (Signed)
Subjective: Patient feels well.  No concerns.  He is ambulating better.  He is now able to control his bowel movements.  No trouble with urination.  Objective: BP 117/77 (BP Location: Right Wrist)   Pulse 70   Temp 99 F (37.2 C) (Oral)   Resp 16   Ht 5\' 9"  (1.753 m)   Wt 117.5 kg Comment: weight recorded after bed was zeroed  SpO2 97%   BMI 38.25 kg/m    Overweight male in no acute distress.  HEENT exam atraumatic, normocephalic.  Neck he is wearing a neck brace.  Chest clear to auscultation cardiac exam S1-S2 regular abdominal exam overweight, active bowel sounds extremities without edema.  Assessment plan: Cervical myelopathy with quadriparesis.  Continue inpatient rehab.  On a steroid taper. 2.  DVT prophylaxis with Eliquis. Pain management: Continue current medications 4.  Neurogenic bowel and bladder currently not a concern. 5.  Hypertension blood pressure well controlled currently. 6.  Morbid obesity 7.  Anemia we will check labs on Monday Lab Results  Component Value Date   HGB 10.5 (L) 04/20/2018    8.  Thrombocytopenia, mild we will check labs on Monday

## 2018-04-24 ENCOUNTER — Inpatient Hospital Stay (HOSPITAL_COMMUNITY): Admitting: Physical Therapy

## 2018-04-24 ENCOUNTER — Inpatient Hospital Stay (HOSPITAL_COMMUNITY): Admitting: Occupational Therapy

## 2018-04-24 ENCOUNTER — Inpatient Hospital Stay (HOSPITAL_COMMUNITY)

## 2018-04-24 DIAGNOSIS — M25561 Pain in right knee: Secondary | ICD-10-CM

## 2018-04-24 LAB — CBC WITH DIFFERENTIAL/PLATELET
ABS IMMATURE GRANULOCYTES: 0 10*3/uL (ref 0.0–0.1)
BASOS ABS: 0 10*3/uL (ref 0.0–0.1)
Basophils Relative: 1 %
Eosinophils Absolute: 0.1 10*3/uL (ref 0.0–0.7)
Eosinophils Relative: 1 %
HCT: 37.4 % — ABNORMAL LOW (ref 39.0–52.0)
HEMOGLOBIN: 11.5 g/dL — AB (ref 13.0–17.0)
IMMATURE GRANULOCYTES: 1 %
LYMPHS PCT: 43 %
Lymphs Abs: 2.5 10*3/uL (ref 0.7–4.0)
MCH: 29.3 pg (ref 26.0–34.0)
MCHC: 30.7 g/dL (ref 30.0–36.0)
MCV: 95.2 fL (ref 78.0–100.0)
Monocytes Absolute: 0.6 10*3/uL (ref 0.1–1.0)
Monocytes Relative: 11 %
NEUTROS ABS: 2.4 10*3/uL (ref 1.7–7.7)
NEUTROS PCT: 43 %
Platelets: 187 10*3/uL (ref 150–400)
RBC: 3.93 MIL/uL — AB (ref 4.22–5.81)
RDW: 13.7 % (ref 11.5–15.5)
WBC: 5.6 10*3/uL (ref 4.0–10.5)

## 2018-04-24 LAB — BASIC METABOLIC PANEL
ANION GAP: 8 (ref 5–15)
BUN: 20 mg/dL (ref 6–20)
CO2: 28 mmol/L (ref 22–32)
Calcium: 9.1 mg/dL (ref 8.9–10.3)
Chloride: 107 mmol/L (ref 98–111)
Creatinine, Ser: 1.24 mg/dL (ref 0.61–1.24)
GFR calc Af Amer: >60 mL/min (ref >60)
GLUCOSE: 121 mg/dL — AB (ref 70–99)
Potassium: 3.7 mmol/L (ref 3.5–5.1)
SODIUM: 143 mmol/L (ref 135–145)

## 2018-04-24 LAB — OCCULT BLOOD X 1 CARD TO LAB, STOOL: Fecal Occult Bld: NEGATIVE

## 2018-04-24 LAB — URIC ACID: URIC ACID, SERUM: 7.4 mg/dL (ref 3.7–8.6)

## 2018-04-24 NOTE — Progress Notes (Signed)
[facility]IC[facility] RE[facility]ILITATION     PROGRESS NOTE  Subjective/Complaints:  Developed right knee pain this weekend after it "gave out in therapy". Knee remains swollen and tender.   ROS: Patient denies fever, rash, sore throat, blurred vision, nausea, vomiting, diarrhea, cough, shortness of breath or chest pain,headache, or mood change.   Objective: Vital Signs: Blood pressure 136/84, pulse 72, temperature 98.2 F (36.8 C), temperature source Oral, resp. rate 17, height 5\' 9"  (1.753 m), weight 117.5 kg, SpO2 100 %. No results found. No results for input(s): WBC, HGB, HCT, PLT in the last 72 hours. No results for input(s): NA, K, CL, GLUCOSE, BUN, CREATININE, CALCIUM in the last 72 hours.  Invalid input(s): CO CBG (last 3)  No results for input(s): GLUCAP in the last 72 hours.  Wt Readings from Last 3 Encounters:  04/12/18 117.5 kg  03/23/18 (!) 139.1 kg    Physical Exam:  BP 136/84 (BP Location: Right Wrist)   Pulse 72   Temp 98.2 F (36.8 C) (Oral)   Resp 17   Ht 5\' 9"  (1.753 m)   Wt 117.5 kg Comment: weight recorded after bed was zeroed  SpO2 100%   BMI 38.25 kg/m  Constitutional: No distress . Vital signs reviewed. HEENT: EOMI, oral membranes moist Neck: supple Cardiovascular: RRR without murmur. No JVD    Respiratory: CTA Bilaterally without wheezes or rales. Normal effort    GI: BS +, non-tender, non-distended  Musculoskeletal: He edema(bilateral lower extremities)and tenderness, improving. Right knee with effusion, warmth and pain Neurological: He isalertand oriented  Motor:  RUE: 4+/5 prox to distal  LUE: 5/5 proximal to distal  RLE: 4-/5 hip flexion, 4/5 knee extension, 5/5 ADF, improving  LLE: 4-/5 hip flexion, 4/5 knee extension, 5/5 ankle dorsiflexion, improving Skin:Incision site remains c/d/i Sacral wound dressed  Assessment/Plan: 1. Functional deficits secondary to cervical myelopathy with quadriparesis which require 3+ hours per  day of interdisciplinary therapy in a comprehensive inpatient rehab setting. Physiatrist is providing close team supervision and 24 hour management of active medical problems listed below. Physiatrist and rehab team continue to assess barriers to discharge/monitor patient progress toward functional and medical goals.  Function:  Bathing Bathing position   Position: Shower  Bathing parts Body parts bathed by patient: Right arm, Left arm, Chest, Abdomen, Front perineal area, Right upper leg, Left upper leg, Right lower leg, Left lower leg, Back, Buttocks Body parts bathed by helper: Buttocks  Bathing assist Assist Level: Touching or steadying assistance(Pt > 75%) Assistive Device Comment: lh sponge    Upper Body Dressing/Undressing Upper body dressing   What is the patient wearing?: Pull over shirt/dress     Pull over shirt/dress - Perfomed by patient: Thread/unthread right sleeve, Thread/unthread left sleeve, Put head through opening, Pull shirt over trunk Pull over shirt/dress - Perfomed by helper: Put head through opening, Pull shirt over trunk        Upper body assist Assist Level: Supervision or verbal cues   Set up : To obtain clothing/put away  Lower Body Dressing/Undressing Lower body dressing   What is the patient wearing?: Non-skid slipper socks, Ted Hose, Pants     Pants- Performed by patient: Thread/unthread right pants leg, Thread/unthread left pants leg, Pull pants up/down Pants- Performed by helper: Thread/unthread right pants leg, Pull pants up/down Non-skid slipper socks- Performed by patient: Don/doff right sock, Don/doff left sock Non-skid slipper socks- Performed by helper: Don/doff right sock, Don/doff left sock(sockaide)  TED Hose - Performed by helper: Don/doff right TED hose, Don/doff left TED hose  Lower body assist Assist for lower body dressing: Touching or steadying assistance (Pt > 75%)      Toileting Toileting Toileting activity  did not occur: Safety/medical concerns Toileting steps completed by patient: Adjust clothing prior to toileting, Adjust clothing after toileting Toileting steps completed by helper: Performs perineal hygiene, Adjust clothing prior to toileting, Adjust clothing after toileting Toileting Assistive Devices: Grab bar or rail  Toileting assist Assist level: Touching or steadying assistance (Pt.75%)   Transfers Chair/bed transfer   Chair/bed transfer method: Ambulatory Chair/bed transfer assist level: Touching or steadying assistance (Pt > 75%) Chair/bed transfer assistive device: Armrests, Patent attorneyWalker     Locomotion Ambulation     Max distance: [id number] Assist level: Supervision or verbal cues   Wheelchair   Type: Manual Max wheelchair distance: 150 Assist Level: Supervision or verbal cues  Cognition Comprehension Comprehension assist level: Follows complex conversation/direction with extra time/assistive device  Expression Expression assist level: Expresses complex ideas: With extra time/assistive device  Social Interaction Social Interaction assist level: Interacts appropriately with others with medication or extra time (anti-anxiety, antidepressant).  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 Cervical collar as directed  Cont CIR  Decadron decreased to 2mg  BID on 8/5, decreased to daily on 8/8 2. DVT Prophylaxis/Anticoagulation/bilateral pulmonary emboli/bilateral posterior tibial peroneal vein YNW:[id number]:Lovenox 60 mg every 12 hours x1 week, transitioned to Eliquis 5 mg twice a day on 8/7, will need to monitor closely for bleeding  Tolerating oral anticoagulation at  present  3. Pain Management:Baclofen 10 mg twice daily, Flexeril and oxycodone as needed 4. Mood:Provide emotional support 5. Neuropsych: This patientiscapable of making decisions on hisown behalf. 6. Skin/Wound Care:Appreciate WOC input for moisture associated sacral wound 7. Fluids/Electrolytes/Nutrition:Routine in and outs  BMP within acceptable range on 8/5--pending today 8.Neurogenic bowel and bladder. Patient now with loose stool  -Hold Colace, Imodium as needed 9.Hypertension./ Cozaar 25 mg daily.   improvingt control 10.Morbid obesity. BMI 45.29. Dietary follow-up 11.History of pernicious anemia with ABLA  Hemoglobin 10.5 on 8/8  Labs ordered for Monday are pending 12. Thrombocytopenia  Platelets 140 on 8/8  Labs ordered for Monday are pending  Discussed with lab/pharmacy for workup for Lovenox induced thrombocytopenia - less likely  HIT within normal limits on 7/31  Hemoccult negative on 8/1, positive on 8/3, negative again on 8/4 13. Hypoalbuminemia  Supplement initiated on 8/1 14. Sleep disturbance  Possibly associated with PTSD  ECG reviewed, with borderline QT prolongation  Cont restoril 7.5 daily  15. Loose stool: remains hit and miss  Colace stopped. D/c sugar protein supp  Cont probiotic  Changed protonix to pepcid  Prn imodium 16. Low grade temp:  -ua negative. Labs pending today    LOS (Days) 13 A FACE TO FACE EVALUATION WAS PERFORMED  Ranelle OysterZachary T Swartz 04/24/2018 9:21 AM

## 2018-04-24 NOTE — Progress Notes (Signed)
RN was called in to the room because it was some dark blood stain on the pt's bed pad. [name] [name] PAC has been notified.Keep monitoring pt. Closely.

## 2018-04-24 NOTE — Progress Notes (Signed)
Physical Therapy Session Note  Patient Details  Name: [name] FreedCornell Buehler MRN: [id number] Date of Birth: April 23, 1966  Today's Date: 04/24/2018 PT Individual Time: 1500-1530 PT Individual Time Calculation (min): 30 min   Short Term Goals: Week 2:  PT Short Term Goal 1 (Week 2): STG=LTg due to ELOS  Skilled Therapeutic Interventions/Progress Updates: Pt received supine in bed, c/o pain 6/10 in R knee, pre-medicated and agreeable to treatment. Pt supine>sit with minA for trunk control. Sit <>stand in stedy, partial sit <>stand from stedy seat x5 reps. Each trial pt demo's standing tolerance x20-30 sec as his "back loosens up",reporting pre-morbid DDD and pain limiting him at baseline. Sit <>stand in stedy from slightly elevated bed with S x2 reps; again increased standing tolerance and upright posture with repetition. Pt agreeable to attempt sit <>stand with RW however reporting unable this morning and apprehensive to try. Performed x2 trials with S, increased time to initiate and complete. Sit >supine with HOB flat and S, increased time. Discussed with pt that he will need to gradually increase activity following increased RLE weakness/pain that occurred over the weekend; provided positive reinforcement and encouragement throughout session. Remained in bed, alarm intact, 4 rails per pt request; all needs in reach.      Therapy Documentation Precautions:  Precautions Precautions: Fall Required Braces or Orthoses: Cervical Brace Cervical Brace: Hard collar Restrictions Weight Bearing Restrictions: No Other Position/Activity Restrictions: cervical collar can be donned in sitting, removed for showering, and removed while in bed per orders   See Function Navigator for Current Functional Status.   Therapy/Group: Individual Therapy  Harlon Dittylizabeth J Seigle 04/24/2018, 3:35 PM

## 2018-04-24 NOTE — Progress Notes (Signed)
Occupational Therapy Session Note  Patient Details  Name: [name] [name] MRN: [id number] Date of Birth: 1966/07/25  Today's Date: 04/24/2018 OT Individual Time: 1300-1400 OT Individual Time Calculation (min): 60 min    Short Term Goals: Week 2:  OT Short Term Goal 1 (Week 2): STG=LTG due to LOS  Skilled Therapeutic Interventions/Progress Updates:    Pt transferred from the EOB to the wheelchair squat pivot with mod assist to start session.  He then rolled himself down to the UE ergonometer for UE strengthening with supervision.  He completed 2 intervals of 5 mins each during session with Random program setting on level 5.  He was able to maintain RPMs at 25 throughout set but needed re-gripping of the right hand on several occasions.  He completed the first set peddling forward and the second set peddling backwards.  Next had pt work on BUE shoulder flexion with use of 1 lb dowel rod.  He completed 3 sets of 10 repetitions with min assist on the right to maintain elbow extension and flex shoulder to 100 degrees.  Finished with one set of bilateral shoulder extension using light resistance therapy band for 15 repetitions.  Returned to room at end of session with pt completing [facility] and then transitioning to supine with min assist.  Call button and phone in reach.    Therapy Documentation Precautions:  Precautions Precautions: Fall Required Braces or Orthoses: Cervical Brace Cervical Brace: Hard collar Restrictions Weight Bearing Restrictions: No Other Position/Activity Restrictions: cervical collar can be donned in sitting, removed for showering, and removed while in bed per orders  Pain: Pain Assessment Pain Scale: Faces Faces Pain Scale: Hurts a little bit Pain Type: Acute pain Pain Location: Knee Pain Orientation: Right Pain Descriptors / Indicators: Discomfort Pain Onset: With Activity Pain Intervention(s): Repositioned ADL: See Function  Navigator for Current Functional Status.   Therapy/Group: Individual Therapy  Statia Burdick OTR/L 04/24/2018, 3:52 PM

## 2018-04-24 NOTE — Progress Notes (Signed)
Physical Therapy Session Note  Patient Details  Name: Clydell Sposito MRN: [id number] Date of Birth: [birth year]  Today's Date: 04/24/2018 PT Individual Time: 1003-1058 PT Individual Time Calculation (min): 55 min   Short Term Goals: Week 2:  PT Short Term Goal 1 (Week 2): STG=LTg due to ELOS  Skilled Therapeutic Interventions/Progress Updates: Pt presented in bed with family present. Pt continues to c/o pain in R knee and refusing OOB activity due to weakness. Per OT required modA for sit to stand this A and used Stedy to return to bed. Provided family ed regarding d/c goal and adv may be downgraded to minA. Discussed with parents difference between supervision and minA with family verbalizing understanding. Pt provided with HEP which is able to be performed at bed level with AROM and AAROM with use of gait belt for assistance. Verbal cues required for maintaining good alignment of RLE during activity. Pt performed heel slides, hip abd/add, AASLR, SAQ x 10/to fatigue. Pt c/o pain max 5/10 with activity. Pt remained in bed with call bell within reach and needs met.      Therapy Documentation Precautions:  Precautions Precautions: Fall Required Braces or Orthoses: Cervical Brace Cervical Brace: Hard collar Restrictions Weight Bearing Restrictions: No Other Position/Activity Restrictions: cervical collar can be donned in sitting, removed for showering, and removed while in bed per orders   See Function Navigator for Current Functional Status.   Therapy/Group: Individual Therapy  Ajna Moors  Glendy Barsanti, PTA  04/24/2018, 12:14 PM

## 2018-04-24 NOTE — Progress Notes (Addendum)
Occupational Therapy Session Note  Patient Details  Name: [name] FreedCornell Beyersdorf MRN: [id number] Date of Birth: 12-17-1965  Today's Date: 04/24/2018 OT Individual Time: 0902-1006 OT Individual Time Calculation (min): 64 min    Short Term Goals: Week 2:  OT Short Term Goal 1 (Week 2): STG=LTG due to LOS  Skilled Therapeutic Interventions/Progress Updates:    Pt's family in for education, however pt needing greater assist this session than on Friday when he was min guard to supervision level.  He needed mod assist with use of the RW for sit to stand initially from the bed with mod assist to ambulate to the toilet, and right knee noted buckling.  Once on the toilet he needed max assist for sit to stand and for toilet hygiene.  He was not able to safely take any steps over to the shower and therapist had to use the [facility] for sit to stand and to transfer.  He completed all of bathing in sitting and then dried off.  Antony SalmonStedy was used again for transfer back out to the EOB for dressing as well as for sit to stand transitions.  He was able to complete sit to stand with mod assist pulling up [facility] the [facility] but needed max assist to clean up bowel incontinence as well as for pulling items over his hips.  Pt transferred back to bed with min assist for lifting the LLE in the bed.  Once completed he was left with call button and phone in reach.  Discussed DME needs with family and will make recommendations based on pt likely not being able to use the tub/shower but having to use the walk-in shower instead.    Therapy Documentation Precautions:  Precautions Precautions: Fall Required Braces or Orthoses: Cervical Brace Cervical Brace: Hard collar Restrictions Weight Bearing Restrictions: No Other Position/Activity Restrictions: cervical collar can be donned in sitting, removed for showering, and removed while in bed per orders Pain: Pain Assessment Pain Scale: Faces Pain Score: 0-No pain Faces Pain Scale: Hurts a  little bit Pain Type: Acute pain Pain Location: Knee Pain Orientation: Right Pain Descriptors / Indicators: Discomfort Pain Onset: Unable to tell Pain Intervention(s): Repositioned ADL: See Function Navigator for Current Functional Status.   Therapy/Group: Individual Therapy  Cristian Davitt OTR/L 04/24/2018, 12:26 PM

## 2018-04-25 ENCOUNTER — Inpatient Hospital Stay (HOSPITAL_COMMUNITY): Admitting: Occupational Therapy

## 2018-04-25 ENCOUNTER — Inpatient Hospital Stay (HOSPITAL_COMMUNITY): Admitting: Physical Therapy

## 2018-04-25 ENCOUNTER — Inpatient Hospital Stay (HOSPITAL_COMMUNITY)

## 2018-04-25 MED ORDER — PREDNISONE 20 MG PO TABS
20.0000 mg | ORAL_TABLET | Freq: Three times a day (TID) | ORAL | Status: DC
  Administered 2018-04-25 – 2018-04-28 (×8): 20 mg via ORAL
  Filled 2018-04-25 (×8): qty 1

## 2018-04-25 MED ORDER — BARRIER CREAM NON-SPECIFIED
1.0000 | TOPICAL_CREAM | Freq: Three times a day (TID) | TOPICAL | Status: DC | PRN
Start: 2018-04-25 — End: 2018-05-02
  Filled 2018-04-25: qty 1

## 2018-04-25 NOTE — Progress Notes (Signed)
Occupational Therapy Session Note  Patient Details  Name: [name] [name] MRN: [id number] Date of Birth: 1965/09/21  Today's Date: 04/25/2018 OT Individual Time: 1300-1401 OT Individual Time Calculation (min): 61 min    Short Term Goals: Week 2:  OT Short Term Goal 1 (Week 2): STG=LTG due to LOS  Skilled Therapeutic Interventions/Progress Updates:    Pt seen in bed secondary to not wanting to get up until after the results of his MRI secondary to pain and weakness in the right knee.   Had pt complete BUE strengthening exercises from bed, starting with shoulder flexion 4 sets of 5 reps with HOB at 25 degrees with use of 1 lb dowel rod.  Min instructional cueing for maintaining right elbow extension throughout the movement.   Progressed to more upright sitting at greater than 47 degrees while having pt use the dowel rod to hit a beach ball back and forth with the therapist for periods of 1-2 mins.  Finished exercises with alternating reaching above head with elbow flexed and pt wearing 1.5 lb wrist weight.  Pt left resting comfortably in bed with call button and phone in reach.     Therapy Documentation Precautions:  Precautions Precautions: Fall Required Braces or Orthoses: Cervical Brace Cervical Brace: Hard collar Restrictions Weight Bearing Restrictions: No Other Position/Activity Restrictions: cervical collar can be donned in sitting, removed for showering, and removed while in bed per orders  Pain: Pain Assessment Pain Score: 4  Pain Type: Acute pain Pain Location: Knee Pain Orientation: Right Pain Descriptors / Indicators: Discomfort Pain Onset: With Activity Pain Intervention(s): Repositioned ADL: See Function Navigator for Current Functional Status.   Therapy/Group: Individual Therapy  Annemarie Sebree OTR/L 04/25/2018, 3:51 PM

## 2018-04-25 NOTE — Progress Notes (Signed)
Physical Therapy Session Note  Patient Details  Name: [name] [name] MRN: [id number] Date of Birth: 1965-09-30  Today's Date: 04/25/2018 PT Individual Time: 1005-1020 PT Individual Time Calculation (min): 15 min   Short Term Goals: Week 2:  PT Short Term Goal 1 (Week 2): STG=LTg due to ELOS  Skilled Therapeutic Interventions/Progress Updates: Tx1: Pt presented in bed refusing therapy. Pt refusing to participate until taken to MRI and results received. Discussed with pt at length benefits of continued activity and what accommodations can be made for knee. Pt verbalized understanding however continued to refuse any interventions. Will continue efforts.   Tx2: Pt continues to refuse to participate in therapy until MRI received. Will continue efforts.      Therapy Documentation Precautions:  Precautions Precautions: Fall Required Braces or Orthoses: Cervical Brace Cervical Brace: Hard collar Restrictions Weight Bearing Restrictions: No Other Position/Activity Restrictions: cervical collar can be donned in sitting, removed for showering, and removed while in bed per orders General: PT Amount of Missed Time (min): 45 Minutes, and additional 60 min  PT Missed Treatment Reason: Patient unwilling to participate;Pain Vital Signs:   Pain: Pain Assessment Pain Scale: 0-10 Pain Score: 2  Pain Type: Acute pain Pain Location: Head Pain Orientation: Anterior Pain Descriptors / Indicators: Aching Pain Frequency: Occasional Pain Onset: Gradual Patients Stated Pain Goal: 2 Pain Intervention(s): Medication (See eMAR) Multiple Pain Sites: No  See Function Navigator for Current Functional Status.   Therapy/Group: Individual Therapy  [name] [name]  [name] [name], PTA  04/25/2018, 10:52 AM

## 2018-04-25 NOTE — Discharge Summary (Signed)
Discharge summary job [id number]

## 2018-04-25 NOTE — Progress Notes (Signed)
[place] PHYSICAL MEDICINE & REHABILITATION     PROGRESS NOTE  Subjective/Complaints:  Severe right knee pain continues. Couldn't move much with therapy yesterday. Didn't sleep  ROS: Patient denies fever, rash, sore throat, blurred vision, nausea, vomiting,  cough, shortness of breath or chest pain, back pain, headache, or mood change.    Objective: Vital Signs: Blood pressure 130/82, pulse 70, temperature 98.8 F (37.1 C), temperature source Oral, resp. rate 16, height 5\' 9"  (1.753 m), weight 117.5 kg, SpO2 100 %. Dg Knee 1-2 Views Right  Result Date: 04/24/2018 CLINICAL DATA:  Pain laterally EXAM: RIGHT KNEE - 1-2 VIEW COMPARISON:  None. FINDINGS: Frontal and lateral views were obtained. No acute fracture or dislocation. There is slight lateral patellar subluxation. There is a bipartite patella, an anatomic variant. There is a small joint effusion. There is moderately severe narrowing of the medial compartment. There is moderate narrowing of the patellofemoral joint. There is spurring in all compartments. There is chondrocalcinosis. IMPRESSION: 1. No fracture or dislocation. There is a small joint effusion. There is mild lateral patellar subluxation. 2.  Bipartite patella, an anatomic variant. 3. Osteoarthritic change, most marked medially but present in all compartments. 4. Chondrocalcinosis, a finding associated with osteoarthritis or with calcium pyrophosphate deposition disease. Both entities may exist concurrently. Electronically Signed   By: Bretta BangWilliam  Woodruff III M.D.   On: 04/24/2018 12:52   Recent Labs    04/24/18 1007  WBC 5.6  HGB 11.5*  HCT 37.4*  PLT 187   Recent Labs    04/24/18 1007  NA 143  K 3.7  CL 107  GLUCOSE 121*  BUN 20  CREATININE 1.24  CALCIUM 9.1   CBG (last 3)  No results for input(s): GLUCAP in the last 72 hours.  Wt Readings from Last 3 Encounters:  04/12/18 117.5 kg  03/23/18 (!) 139.1 kg    Physical Exam:  BP 130/82 (BP Location: Right  Wrist)   Pulse 70   Temp 98.8 F (37.1 C) (Oral)   Resp 16   Ht 5\' 9"  (1.753 m)   Wt 117.5 kg Comment: weight recorded after bed was zeroed  SpO2 100%   BMI 38.25 kg/m  Constitutional: No distress . Vital signs reviewed. HEENT: EOMI, oral membranes moist Neck: supple Cardiovascular: RRR without murmur. No JVD    Respiratory: CTA Bilaterally without wheezes or rales. Normal effort    GI: BS +, non-tender, non-distended  Musculoskeletal: He edema(bilateral lower extremities)and tenderness, improving. Right knee with continued effusion, warmth and pain to basic palpation and ROM Neurological: He isalertand oriented  Motor:  RUE: 4+/5 prox to distal  LUE: 5/5 proximal to distal  RLE: limited due to pain LLE: 4-/5 hip flexion, 4/5 knee extension, 5/5 ankle dorsiflexion, improving Skin:Incision site remains c/d/i Sacral wound dressed  Assessment/Plan: 1. Functional deficits secondary to cervical myelopathy with quadriparesis which require 3+ hours per day of interdisciplinary therapy in a comprehensive inpatient rehab setting. Physiatrist is providing close team supervision and 24 hour management of active medical problems listed below. Physiatrist and rehab team continue to assess barriers to discharge/monitor patient progress toward functional and medical goals.  Function:  Bathing Bathing position   Position: Shower  Bathing parts Body parts bathed by patient: Right arm, Left arm, Chest, Abdomen, Right upper leg, Left upper leg, Right lower leg, Left lower leg, Back Body parts bathed by helper: Buttocks, Front perineal area  Bathing assist Assist Level: Touching or steadying assistance(Pt > 75%) Assistive Device Comment: lh  sponge    Upper Body Dressing/Undressing Upper body dressing   What is the patient wearing?: Pull over shirt/dress     Pull over shirt/dress - Perfomed by patient: Thread/unthread right sleeve, Thread/unthread left sleeve, Put head through opening,  Pull shirt over trunk Pull over shirt/dress - Perfomed by helper: Put head through opening, Pull shirt over trunk        Upper body assist Assist Level: Supervision or verbal cues   Set up : To obtain clothing/put away  Lower Body Dressing/Undressing Lower body dressing   What is the patient wearing?: Pants, Non-skid slipper socks     Pants- Performed by patient: Thread/unthread right pants leg, Thread/unthread left pants leg Pants- Performed by helper: Pull pants up/down Non-skid slipper socks- Performed by patient: Don/doff right sock, Don/doff left sock Non-skid slipper socks- Performed by helper: Don/doff right sock, Don/doff left sock               TED Hose - Performed by helper: Don/doff right TED hose, Don/doff left TED hose  Lower body assist Assist for lower body dressing: Touching or steadying assistance (Pt > 75%)      Toileting Toileting Toileting activity did not occur: Safety/medical concerns Toileting steps completed by patient: Adjust clothing prior to toileting, Adjust clothing after toileting Toileting steps completed by helper: Adjust clothing prior to toileting, Performs perineal hygiene, Adjust clothing after toileting Toileting Assistive Devices: Grab bar or rail  Toileting assist Assist level: Touching or steadying assistance (Pt.75%)   Transfers Chair/bed transfer   Chair/bed transfer method: Squat pivot Chair/bed transfer assist level: Moderate assist (Pt 50 - 74%/lift or lower) Chair/bed transfer assistive device: Armrests, Patent attorneyWalker     Locomotion Ambulation     Max distance: 17150ft Assist level: Supervision or verbal cues   Wheelchair   Type: Manual Max wheelchair distance: 150 Assist Level: Supervision or verbal cues  Cognition Comprehension Comprehension assist level: Follows complex conversation/direction with extra time/assistive device  Expression Expression assist level: Expresses complex ideas: With extra time/assistive device   Social Interaction Social Interaction assist level: Interacts appropriately with others with medication or extra time (anti-anxiety, antidepressant).  Problem Solving Problem solving assist level: Solves basic problems with no assist  Memory Memory assist level: More than reasonable amount of time    Medical Problem List and Plan: 1.Right upper extremity weakness and bilateral lower extremity weakness secondary to OPLL/ cervical stenosis with resultant cervical myelopathy status post C3-C5 ACDF 03/24/2018. Cervical collar as directed  Cont CIR  Decadron decreased to 2mg  BID on 8/5, decreased to daily on 8/8 2. DVT Prophylaxis/Anticoagulation/bilateral pulmonary emboli/bilateral posterior tibial peroneal vein UJW:[id number]:Lovenox 60 mg every 12 hours x1 week, transitioned to Eliquis 5 mg twice a day on 8/7, will need to monitor closely for bleeding  Tolerating oral anticoagulation at present  3. Pain Management:Baclofen 10 mg twice daily, Flexeril and oxycodone as needed  -increased right knee pain, concern for meniscal injury  -patient also has pain along quad tendon  -knee xr shows significant medial compartment OA  -MRI today  -scheduled Ice  -consider knee injection if no significant injury which would prevent 4. Mood:Provide emotional support 5. Neuropsych: This patientiscapable of making decisions on hisown behalf. 6. Skin/Wound Care:Appreciate WOC input for moisture associated sacral wound 7. Fluids/Electrolytes/Nutrition:Routine in and outs  BMP within acceptable range on 8/12 8.Neurogenic bowel and bladder. Patient now with loose stool  -Held Colace, Imodium as needed 9.Hypertension./ Cozaar 25 mg daily.   improving control 10.Morbid obesity. BMI  45.29. Dietary follow-up 11.History of pernicious anemia with ABLA  Hemoglobin 11.5 8/12    12. Thrombocytopenia  Platelets 187 8/12    HIT within normal limits on 7/31  Hemoccult negative on 8/1, positive on 8/3,  negative again on 8/4 13. Hypoalbuminemia  Supplement initiated on 8/1 14. Sleep disturbance  Possibly associated with PTSD  ECG reviewed, with borderline QT prolongation  Cont restoril 7.5 daily  15. Loose stool: remains hit and miss  Colace stopped. D/c sugar protein supp  Cont probiotic  Changed protonix to pepcid  Prn imodium  -?rare SE of decadron---d/c 16. Low grade temp:  -ua negative. Labs negative    LOS (Days) 14 A FACE TO FACE EVALUATION WAS PERFORMED  Ranelle Oyster 04/25/2018 9:25 AM

## 2018-04-25 NOTE — Progress Notes (Signed)
Occupational Therapy Note  Patient Details  Name: [name] [name] MRN: [id number] Date of Birth: [birth year]  Today's Date: 04/25/2018 OT Missed Time: 30 Minutes Missed Time Reason: Pain  Pt missed 30 mins skilled OT services secondary to R knee pain.  Pt stated he "couldn't do anything" this morning.    Lavone NeriLanier, [facility] 04/25/2018, 9:04 AM

## 2018-04-25 NOTE — Discharge Summary (Deleted)
NAMAzucena [name]: [name], [name] MEDICAL RECORD [id number] ACCOUNT [id number] DATE OF BIRTH:1966-03-16 FACILITY: [facility] LOCATION: [facility] PHYSICIAN:[name] PATEL, MD  DISCHARGE SUMMARY  DATE OF DISCHARGE:  04/26/2018  DATE OF ADMISSION:  04/11/2018  DATE OF DISCHARGE:  04/26/2018   DISCHARGE DIAGNOSES: 1.  Cervical myelopathy status post C3-C5 ACDF 03/24/2018. 2.  Bilateral pulmonary emboli with bilateral posterior tibial, peroneal vein deep vein thromboses maintained on Eliquis.  3.  Pain management, right knee pain. 4.  Neurogenic bowel and bladder. 5.  Hypertension. 6.  Morbid obesity. 7.  History of pernicious anemia. 8.  Thrombocytopenia. 9.  Complex tear of the posterior horn of the medial right meniscus   HISTORY OF PRESENT ILLNESS:  This is a 52 year old right-handed male with history of hypertension, pernicious anemia, morbid obesity, gout as well as longstanding low back pain.  He lives alone.  Plan is to stay with his parents on discharge.  Presented  03/22/2018 with decrease in balance, numbness in his hands, primarily on the right dragging of his leg.  He had a recent fall x2.  He re-fell and struck his head and neck.  The patient also states increasing constipation as well as urinary retention.   Cranial CT scan showed no acute changes.  MRI thoracic, lumbar spine showed severe cord compression extending from C3-C4 down to C4-5.  He underwent anterior cervical corpectomy of C4, microdissection foraminotomies of C4 and C5 nerve root 03/24/2018 per  Dr. Wynetta [name].  HOSPITAL COURSE:  Pain management.  Rapid response 03/25/2018 after the patient unresponsive after transferring from chair to bed initially hypoxic during event, nonrebreather mask.  Received normal saline bolus again on 03/27/2018 with increased  shortness of breath.  CT angiogram of the chest showed bilateral acute pulmonary emboli occlusive with multiple segmental and subsegmental branches.  Venous Doppler is positive for  acute DVT involving bilateral posterior, tibial and peroneal veins.   Venous Doppler upper extremities negative.  The patient initially placed on heparin and transitioned to Coumadin.  Cardiology service is consulted for bradycardia.  EKG sinus rhythm.  Echocardiogram with ejection fraction of 55%, grade II diastolic  dysfunction.  Underwent placement of IVC filter per interventional radiology on 03/28/2018 as well as reexploration of anterior cervical wound following initial heparin anticoagulation for active oozing from the site.  The patient's heparin and Coumadin  discontinued due to sensitivity of anticoagulation.  He did receive vitamin K.  Initial attempts to transition to Lovenox; however, increasing oozing again from site.  Lovenox discontinued.  Oncology/hematology service is consulted on 07/26.  Lovenox  conservatively again initiated 40 mg daily.  He would transition to 40 mg daily, x48 hours, then change to 60 mg every 12 hours x1 week, then again if no bleeding transition to Eliquis 5 mg p.o. b.i.d.  Close monitoring of hemoglobin.  The patient was  admitted for a comprehensive rehabilitation program.  PAST MEDICAL HISTORY:  See discharge diagnoses.  SOCIAL HISTORY:  Lives alone.  Plans to stay with his parents on discharge.  FUNCTIONAL STATUS:  Upon admission to rehab services was minimal assist +2 physical assist of 130 feet rolling walker, minimal assist with stand pivot transfers, min mod assist with activities of daily living.  PHYSICAL EXAMINATION: VITAL SIGNS:  Blood pressure 128/78, pulse 56, temperature 97, respirations 18. GENERAL:  Alert male in no acute distress.  EOMs intact.  Cervical collar in place. CARDIOVASCULAR:  Rate controlled. ABDOMEN:  Soft, nontender, good bowel sounds. LUNGS:  Clear to auscultation without wheeze.  REHABILITATION HOSPITAL  COURSE:  The patient was admitted to inpatient rehabilitation services.  Therapies initiated on a 3-hour daily basis,  consisting of physical therapy, occupational therapy and rehabilitation nursing.  The following issues were  addressed during patient's rehabilitation stay.  Pertaining to the patient's cervical myelopathy, he had undergone C3-C5 ACDF.  He would follow up with neurosurgery.  Cervical collar as directed.  He was completing a Decadron taper.  He was transitioned  from Lovenox to Eliquis for pulmonary emboli, bilateral posterior tibial, peroneal vein DVTs.  Close monitoring of hemoglobin and hematocrit that remained stable.  He did have a history of noted pernicious anemia.  He would follow up with hematology  services.  Neurogenic bowel and bladder.  Bowel program regulated.  Blood pressure controlled.  He did have some right knee pain with noted history of gout.  Uric acid level 7.4.  X-rays of right knee showed no fracture or dislocation, small joint  effusion, osteoarthritic changes.  MRI completed of the knee 04/25/2018 showing complex tear of the posterior horn of the medial meniscus.  Orthopedic services consulted no surgical intervention.  Patient placed in a Bledsoe brace unlocked weightbearing as tolerated with plans to follow-up outpatient [facility] pain management with the use of baclofen as well as oxycodone.  The patient received weekly collaborative interdisciplinary team conferences to discuss estimated length of stay, family teaching, any barriers to  discharge.  Therapies variable.  Moderate assist for sit to stand, working with active range of motion.  Sit to stand and steady, partial sit to stand from steady, uses a rolling walker on trials of sit to stand.  Discussed energy conservation.  Cervical  collar as directed.  Activities of daily living using a rolling walker sit to stand.  He was able to safely take steps over to the shower.  Completed all bathing sitting.  Transferred back to bed with minimal assistance.  Ongoing family teaching and  plan was to discharge to  home.  DISCHARGE MEDICATIONS:  Included Eliquis 5 mg p.o. b.i.d., aspirin 81 mg p.o. daily, baclofen 10 mg p.o. b.i.d., vitamin B12 at 5000 mcg daily, Decadron 2 mg daily, Pepcid 20 mg p.o. b.i.d., Lasix 20 mg p.o. daily, Cozaar 25 mg p.o. daily, Florastor 250  mg p.o. b.i.d., Restoril 7.5 mg p.o. at bedtime, Flexeril 10 mg p.o. t.i.d. as needed, oxycodone 10 mg q.3 hours as needed for pain.  Latest labs, uric acid level 7.4, hemoglobin 11.5, hematocrit 37.4, sodium 143, potassium 3.7, BUN 20, creatinine 1.24.  The patient would follow up with Dr. Maryla MorrowAnkit [name] at the outpatient rehab service office as advised; Dr. Donalee CitrinGary Cram, call for appointment; Dr. Candise CheKale of hematology service, call for appointment; Dr. Devra Doppamieka Howell, medical management.  Follow-up [facility] in regards to right medial meniscus tear  DIET:  Regular.  SPECIAL INSTRUCTIONS:  Cervical collar at all times.  Weightbearing as tolerated right lower extremity  TN/NUANCE D:04/25/2018 T:04/25/2018 JOB:[id number]

## 2018-04-26 ENCOUNTER — Inpatient Hospital Stay (HOSPITAL_COMMUNITY): Admitting: Physical Therapy

## 2018-04-26 ENCOUNTER — Inpatient Hospital Stay (HOSPITAL_COMMUNITY): Admitting: Occupational Therapy

## 2018-04-26 ENCOUNTER — Inpatient Hospital Stay (HOSPITAL_COMMUNITY)

## 2018-04-26 LAB — BASIC METABOLIC PANEL
Anion gap: 7 (ref 5–15)
BUN: 15 mg/dL (ref 6–20)
CHLORIDE: 106 mmol/L (ref 98–111)
CO2: 29 mmol/L (ref 22–32)
CREATININE: 1.11 mg/dL (ref 0.61–1.24)
Calcium: 9.4 mg/dL (ref 8.9–10.3)
GFR calc non Af Amer: >60 mL/min (ref >60)
Glucose, Bld: 118 mg/dL — ABNORMAL HIGH (ref 70–99)
POTASSIUM: 4.2 mmol/L (ref 3.5–5.1)
Sodium: 142 mmol/L (ref 135–145)

## 2018-04-26 LAB — CBC
HEMATOCRIT: 35.9 % — AB (ref 39.0–52.0)
Hemoglobin: 11.3 g/dL — ABNORMAL LOW (ref 13.0–17.0)
MCH: 29.9 pg (ref 26.0–34.0)
MCHC: 31.5 g/dL (ref 30.0–36.0)
MCV: 95 fL (ref 78.0–100.0)
Platelets: 218 10*3/uL (ref 150–400)
RBC: 3.78 MIL/uL — AB (ref 4.22–5.81)
RDW: 13.6 % (ref 11.5–15.5)
WBC: 7.1 10*3/uL (ref 4.0–10.5)

## 2018-04-26 NOTE — Progress Notes (Signed)
Physical Therapy Session Note  Patient Details  Name: [name] [name] MRN: [id number] Date of Birth: 11/09/1965  Today's Date: 04/26/2018 PT Individual Time:1345-1500   75 min   Short Term Goals: Week 1:  PT Short Term Goal 1 (Week 1): Pt will perform bed mobility with supervision assist  PT Short Term Goal 1 - Progress (Week 1): Met PT Short Term Goal 2 (Week 1): Pt will ambulate 179f with min assist  PT Short Term Goal 2 - Progress (Week 1): Met PT Short Term Goal 3 (Week 1): Pt will perform sit<>stand with min assist consistently PT Short Term Goal 3 - Progress (Week 1): Met PT Short Term Goal 4 (Week 1): Pt will initiate stair training  PT Short Term Goal 4 - Progress (Week 1): Met Week 2:  PT Short Term Goal 1 (Week 2): STG=LTg due to ELOS Week 3:     Skilled Therapeutic Interventions/Progress Updates:   Pt received supine in bed and agreeable to PT. Supine>sit transfer with supervision assist and increased time duew to RLE weakness.    Stand pivot transfers throughout treatment with min-mod assist from PT for safety and moderate cues from PT for posture, step length, and RLE awareness.   Nustep BLE strengthening and endurance 6 min +4 min with supervision assist from PT to improve hip alignment.   WC mobility with supervision assist from PT x 153fand min cues for safety in doorways and turns.   PT instructed pt in seated therex. LAQ, x 14 BLE, hip abduction 2 x 12 with level 2 tband, hip flexion with level 2 tband 2x 12, calf raises 2x 20 with level 2 tband.   Pt returned to room and performed attempt transfer to toilet but was incontinent prior to transfer. Stand pivot transfer to bed with min assist and UE support on bed rail . Sit>supine completed with mod assist to manage BLE, and left supine in bed with call bell in reach and all needs met.          Therapy Documentation Precautions:  Precautions Precautions: Fall Required Braces or Orthoses: Cervical  Brace Cervical Brace: Hard collar Restrictions Weight Bearing Restrictions: No Other Position/Activity Restrictions: cervical collar can be donned in sitting, removed for showering, and removed while in bed per orders Pain: Pain Assessment Pain Scale: Faces Faces Pain Scale: Hurts a little bit Pain Type: Acute pain Pain Location: Knee Pain Orientation: Right Pain Descriptors / Indicators: Discomfort Pain Onset: With Activity Pain Intervention(s): Repositioned Multiple Pain Sites: No   See Function Navigator for Current Functional Status.   Therapy/Group: Individual Therapy  AuLorie Phenix/14/2019, 2:38 PM

## 2018-04-26 NOTE — Progress Notes (Signed)
Orthopedic Tech Progress Note Patient Details:  [name] FreedCornell [name] May 02, 1966 [id number]  Patient ID: [name] [name], male   DOB: May 02, 1966, 52 y.o.   MRN: [id number]   [name] [name], [name] [name] 04/26/2018, 12:16 PM Called in bio-tech brace order; spoke with answering service

## 2018-04-26 NOTE — Progress Notes (Signed)
[facility] PHYSICAL MEDICINE & REHABILITATION     PROGRESS NOTE  Subjective/Complaints:  Pt still with right knee pain. Fever overnight 101.5. Denies other associated symptoms. Liquid stool yesterday, soft/mushy stool this am  ROS: Patient denies  rash, sore throat, blurred vision, nausea, vomiting, diarrhea, cough, shortness of breath or chest pain, joint or back pain, headache, or mood change.   Objective: Vital Signs: Blood pressure 119/78, pulse 93, temperature (!) 100.4 F (38 C), temperature source Oral, resp. rate 18, height 5\' 9"  (1.753 m), weight 118 kg, SpO2 99 %. Dg Knee 1-2 Views Right  Result Date: 04/24/2018 CLINICAL DATA:  Pain laterally EXAM: RIGHT KNEE - 1-2 VIEW COMPARISON:  None. FINDINGS: Frontal and lateral views were obtained. No acute fracture or dislocation. There is slight lateral patellar subluxation. There is a bipartite patella, an anatomic variant. There is a small joint effusion. There is moderately severe narrowing of the medial compartment. There is moderate narrowing of the patellofemoral joint. There is spurring in all compartments. There is chondrocalcinosis. IMPRESSION: 1. No fracture or dislocation. There is a small joint effusion. There is mild lateral patellar subluxation. 2.  Bipartite patella, an anatomic variant. 3. Osteoarthritic change, most marked medially but present in all compartments. 4. Chondrocalcinosis, a finding associated with osteoarthritis or with calcium pyrophosphate deposition disease. Both entities may exist concurrently. Electronically Signed   By: Bretta Bang III M.D.   On: 04/24/2018 12:52   Recent Labs    04/24/18 1007  WBC 5.6  HGB 11.5*  HCT 37.4*  PLT 187   Recent Labs    04/24/18 1007  NA 143  K 3.7  CL 107  GLUCOSE 121*  BUN 20  CREATININE 1.24  CALCIUM 9.1   CBG (last 3)  No results for input(s): GLUCAP in the last 72 hours.  Wt Readings from Last 3 Encounters:  04/26/18 118 kg  03/23/18 (!) 139.1 kg     Physical Exam:  BP 119/78 (BP Location: Left Arm)   Pulse 93   Temp (!) 100.4 F (38 C) (Oral)   Resp 18   Ht 5\' 9"  (1.753 m)   Wt 118 kg   SpO2 99%   BMI 38.42 kg/m  Constitutional: No distress . Vital signs reviewed. HEENT: EOMI, oral membranes moist Neck: supple Cardiovascular: RRR without murmur. No JVD    Respiratory: CTA Bilaterally without wheezes or rales. Normal effort    GI: BS +, non-tender, non-distended  Musculoskeletal: Right knee with continued effusion, warmth and pain to basic palpation and ROM Neurological: He isalertand oriented  Motor:  RUE: 4+/5 prox to distal  LUE: 5/5 proximal to distal  RLE: limited due to pain LLE: 4-/5 hip flexion, 4/5 knee extension, 5/5 ankle dorsiflexion, improving Skin:Incision site remains c/d/i Sacral wound is dressed  Assessment/Plan: 1. Functional deficits secondary to cervical myelopathy with quadriparesis which require 3+ hours per day of interdisciplinary therapy in a comprehensive inpatient rehab setting. Physiatrist is providing close team supervision and 24 hour management of active medical problems listed below. Physiatrist and rehab team continue to assess barriers to discharge/monitor patient progress toward functional and medical goals.  Function:  Bathing Bathing position   Position: Shower  Bathing parts Body parts bathed by patient: Right arm, Left arm, Chest, Abdomen, Right upper leg, Left upper leg, Right lower leg, Left lower leg, Back Body parts bathed by helper: Buttocks, Front perineal area  Bathing assist Assist Level: Touching or steadying assistance(Pt > 75%) Assistive Device Comment: lh sponge  Upper Body Dressing/Undressing Upper body dressing   What is the patient wearing?: Pull over shirt/dress     Pull over shirt/dress - Perfomed by patient: Thread/unthread right sleeve, Thread/unthread left sleeve, Put head through opening, Pull shirt over trunk Pull over shirt/dress - Perfomed by  helper: Put head through opening, Pull shirt over trunk        Upper body assist Assist Level: Supervision or verbal cues   Set up : To obtain clothing/put away  Lower Body Dressing/Undressing Lower body dressing   What is the patient wearing?: Pants, Non-skid slipper socks     Pants- Performed by patient: Thread/unthread right pants leg, Thread/unthread left pants leg Pants- Performed by helper: Pull pants up/down Non-skid slipper socks- Performed by patient: Don/doff right sock, Don/doff left sock Non-skid slipper socks- Performed by helper: Don/doff right sock, Don/doff left sock               TED Hose - Performed by helper: Don/doff right TED hose, Don/doff left TED hose  Lower body assist Assist for lower body dressing: Touching or steadying assistance (Pt > 75%)      Toileting Toileting Toileting activity did not occur: Safety/medical concerns Toileting steps completed by patient: Adjust clothing prior to toileting, Adjust clothing after toileting Toileting steps completed by helper: Adjust clothing prior to toileting, Performs perineal hygiene, Adjust clothing after toileting Toileting Assistive Devices: Grab bar or rail  Toileting assist Assist level: Touching or steadying assistance (Pt.75%)   Transfers Chair/bed transfer   Chair/bed transfer method: Squat pivot Chair/bed transfer assist level: Moderate assist (Pt 50 - 74%/lift or lower) Chair/bed transfer assistive device: Armrests, Patent attorneyWalker     Locomotion Ambulation     Max distance: 14750ft Assist level: Supervision or verbal cues   Wheelchair   Type: Manual Max wheelchair distance: 150 Assist Level: Supervision or verbal cues  Cognition Comprehension Comprehension assist level: Follows complex conversation/direction with extra time/assistive device  Expression Expression assist level: Expresses complex 90% of the time/cues < 10% of the time  Social Interaction Social Interaction assist level: Interacts  appropriately 90% of the time - Needs monitoring or encouragement for participation or interaction.  Problem Solving Problem solving assist level: Solves complex 90% of the time/cues < 10% of the time  Memory Memory assist level: Recognizes or recalls 90% of the time/requires cueing < 10% of the time    Medical Problem List and Plan: 1.Right upper extremity weakness and bilateral lower extremity weakness secondary to OPLL/ cervical stenosis with resultant cervical myelopathy status post C3-C5 ACDF 03/24/2018. Cervical collar as directed  Cont CIR   -dc on hold given right knee pain, fever 2. DVT Prophylaxis/Anticoagulation/bilateral pulmonary emboli/bilateral posterior tibial peroneal vein ZOX:WRUEAVWVT:Lovenox 60 mg every 12 hours x1 week, transitioned to Eliquis 5 mg twice a day on 8/7, will need to monitor closely for bleeding  Tolerating oral anticoagulation at present  3. Pain Management:Baclofen 10 mg twice daily, Flexeril and oxycodone as needed  -increased right knee pain, concern for meniscal injury  -patient also has pain along quad tendon  -knee xr shows significant medial compartment OA  -MRI completed, reading pending.    -scheduled Ice  -consider knee injection if no significant injury which would prevent 4. Mood:Provide emotional support 5. Neuropsych: This patientiscapable of making decisions on hisown behalf. 6. Skin/Wound Care:Appreciate WOC input for moisture associated sacral wound 7. Fluids/Electrolytes/Nutrition:Routine in and outs  BMP within acceptable range on 8/12 8.Neurogenic bowel and bladder. Patient now with loose stool  -  Held Colace, Imodium as needed 9.Hypertension./ Cozaar 25 mg daily.   improving control 10.Morbid obesity. BMI 45.29. Dietary follow-up 11.History of pernicious anemia with ABLA  Hemoglobin 11.5 8/12    12. Thrombocytopenia  Platelets 187 8/12    HIT within normal limits on 7/31  Hemoccult negative on 8/1, positive on 8/3,  negative again on 8/4 13. Hypoalbuminemia  Supplement initiated on 8/1 14. Sleep disturbance  Possibly associated with PTSD  ECG reviewed, with borderline QT prolongation  Cont restoril 7.5 daily  15. Loose stool: remains hit and miss  Colace stopped. D/c sugar protein supp  Cont probiotic  Changed protonix to pepcid  Prn imodium  -?rare SE of decadron---d/c 16. Low grade temp:  -now with fever  -check cbc/bmet today  -check ua/ucx  -cxr.   -await knee MRI reading  -?related to diarrhea?--stools not c/w infectious diarrhea however    LOS (Days) 15 A FACE TO FACE EVALUATION WAS PERFORMED  Ranelle OysterZachary T Swartz 04/26/2018 9:09 AM

## 2018-04-26 NOTE — Progress Notes (Signed)
Physical Therapy Weekly Progress Note  Patient Details  Name: [name] [name] MRN: [id number] Date of Birth: 1966-08-21  Beginning of progress report period: April 19, 2018 End of progress report period: April 26, 2018  Today's Date: 04/26/2018 PT Individual Time: 1025-1100 PT Individual Time Calculation (min): 35 min   Patient has met 0 of 1 short term goals.  Pt was progressing well during current week of therapy however had setback over weekend. Pt is currently minA to CGA with bed mobility, modA transfers, and modA gait for short distances, approx 61f. Pt noted to now have R knee buckling and TTP at lateral aspect of knee. Disgnostics revealed R lateral meniscus tear which PA advised pt today will be treated with a brace and conservative treatment. Will continue to assist pt in next 2-3 days for updated d/c plans.   Patient continues to demonstrate the following deficits muscle weakness and unbalanced muscle activation and therefore will continue to benefit from skilled PT intervention to increase functional independence with mobility.  Patient progressing toward long term goals..  Continue plan of care.  PT Short Term Goals Week 2:  PT Short Term Goal 1 (Week 2): STG=LTg due to ELOS Week 3:    STG +LTG due to estimated d/c date  Skilled Therapeutic Interventions/Progress Updates: Pt presented in bed agreeable to therapy. Pt agreeable to perform W[place]activities thi session. Performed supine to sit with HOB elevated minA for RLE management. Participated in sit to/from stands in SMorrillx 3. Pt was able to perform TKE while standing in Stedy x 5 with no increase in pain. Pt performed sit to/from stand in Stedy x 3 for increased recruitment in BLE. Pt transferred to w/c and transported to rehab gym for time management. Performed sit to stand in parallel bars modA. Pt performed hip flexion, lateral wt shifts, and forward wt shifts x 5 with noted x 2 occurrences of R knee buckling. Pt ambulated  in parallel bars minA with x 2 incidents of R knee buckling but pt able to maintain balance with parallel bars. Once seated pt participated in HS curls with level 2 resistance band x 10 to tolerance. Pt returned to w/c and transported back to room. Once in room Dan PA arrived to adv pt that MRI results in and noted lateral meniscus tear. Order placed for Bledsoe brace and WBAT with conservative treatment. Pt verbalized understanding and appreciative of information. Pt remained in w/c at end of session with call bell within reach and needs met.       Therapy Documentation Precautions:  Precautions Precautions: Fall Required Braces or Orthoses: Cervical Brace Cervical Brace: Hard collar Restrictions Weight Bearing Restrictions: No Other Position/Activity Restrictions: cervical collar can be donned in sitting, removed for showering, and removed while in bed per orders General:   Vital Signs: Therapy Vitals Temp: (!) 100.4 F (38 C) Temp Source: Oral Pulse Rate: 93 Resp: 18 BP: 119/78 Patient Position (if appropriate): Lying Oxygen Therapy SpO2: 99 % O2 Device: Room Air Pain: Pain Assessment Pain Scale: 0-10 Pain Score: Asleep Pain Type: Acute pain Pain Location: Leg Pain Orientation: Right Pain Radiating Towards: ankle Pain Descriptors / Indicators: Throbbing;Aching Pain Frequency: Intermittent Pain Onset: With Activity Pain Intervention(s): Medication (See eMAR)   See Function Navigator for Current Functional Status.  Therapy/Group: Individual Therapy  Rosita DeChalus  Rosita DeChalus, PTA  04/26/2018, 7:58 AM

## 2018-04-26 NOTE — Progress Notes (Signed)
Social Work Patient ID: [name] [name], male   DOB: September 07, 1966, 52 y.o.   MRN: [id number]  Have reviewed team conference with pt who understands that team will "regroup" on Friday after they have had several sessions to determine new targeted d/c date and if goals  need to be adjusted.  Still awaiting delivery of brace for knee support.  Will also follow up with insurance CM once plan known.  [name], LCSW

## 2018-04-26 NOTE — Progress Notes (Signed)
MRI of the right knee showed complex tear the posterior horn of the medial meniscus.  Possible tiny undersurface tear along the posterior horn-body junction of lateral meniscus.  Orthopedic services consulted films have been reviewed with Earney HamburgMichael Jeffrey, PA-C as well as orthopedic team.  No surgical intervention required.  Order for Bledsoe brace unlocked weightbearing as tolerated and [facility]  All issues discussed with patient.

## 2018-04-26 NOTE — Patient Care Conference (Addendum)
Inpatient RehabilitationTeam Conference and Plan of Care Update Date: 04/26/2018   Time: 11:30 AM    Patient Name: [name] [name]      Medical Record Number: [id number]  Date of Birth: 11-Oct-1965 Sex: Male         Room/Bed: 4W19C/4W19C-01 Payor Info: Payor: TRICARE / Plan: TRICARE EAST / Product Type: *No Product type* /    Admitting Diagnosis: Cervical Myelopathy  Admit Date/Time:  04/11/2018  6:01 PM Admission Comments: No comment available   Primary Diagnosis:  <principal problem not specified> Principal Problem: <principal problem not specified>  Patient Active Problem List   Diagnosis Date Noted  . Hypoalbuminemia due to protein-calorie malnutrition (HCC)   . PTSD (post-traumatic stress disorder)   . Sleep disorder   . Anasarca   . Thrombocytopenia (HCC)   . Morbid obesity (HCC)   . Acute blood loss anemia   . Benign essential HTN   . Cervical myelopathy (HCC) 04/11/2018  . Pressure injury of skin 04/10/2018  . Acute deep vein thrombosis (DVT) of lower extremity (HCC)   . Bleeding   . Pulmonary embolism (HCC)bilateral, failed heparin IVC filter placed7/16 03/29/2018  . Preop cardiovascular exam   . Acute deep vein thrombosis (DVT) of right lower extremity (HCC)   . Myelopathy (HCC) 03/22/2018    Expected Discharge Date: Expected Discharge Date: (TBD)  Team Members Present: Physician leading conference: Dr. Claudette LawsAndrew Kirsteins Social Worker Present: Amada JupiterLucy Monta Maiorana, LCSW Nurse Present: Willey BladeKaren Winter, RN PT Present: Grier RocherAustin Tucker, PT;Rosita Dechalus, PTA OT Present: Perrin MalteseJames McGuire, OT SLP Present: Feliberto Gottronourtney Payne, SLP PPS Coordinator present : Tora DuckMarie Noel, RN, CRRN     Current Status/Progress Goal Weekly Team Focus  Medical   improving neurological status until over the weekend with sudden increase in right knee pain without trauma---work up in progress  improve pain, stability in legs  see medical progress notes/above   Bowel/Bladder   continent of bowel and bladder; loose  stools last niight  bulk up stools & continue to assist as needed  timed toileting & assist as needed for bowel formation   Swallow/Nutrition/ Hydration             ADL's   supervision for UB bathing and dressing, mod assist for LB bathing with mod to max for dressing secondary to regression and increased weakness/pain in the RLE,  Still with increased weakness in the RUE compared to the left as well but improving  Supervision overall  selfcare retraining, balance retraining, transfer training, therapeutic exercise, neurouscular re-education   Mobility   CGA>supervision bed mobility, modA transfer, gait >12050ft Sat, presently 6110ft modA.   supervision overall  LE strengthening, endurance,  balance, gait   Communication             Safety/Cognition/ Behavioral Observations            Pain   no complaints of pain  pain scale <3/10  assess & treat as needed   Skin   MASD on his bottom  no signs of infection, no new areas of skin break down  assess skin q shift      *See Care Plan and progress notes for long and short-term goals.     Barriers to Discharge  Current Status/Progress Possible Resolutions Date Resolved   Physician    Medical stability        see medical progress notes      Nursing  Medical stability  PT                    OT                  SLP                SW                Discharge Planning/Teaching Needs:  Pt to d/c home with parents who cannot offer much physical assistance.  Minimal family ed begun on Monday but limited due to new issues with right leg   Team Discussion:  Pt with medical issues which have affected mobility and ADLs since the start of the week.  MRI today with noted "complex tear the posterior horn of the medial meniscus" and MD has ordered for brace.  Therapist note he had declined in overall function from supervision level to moderate assistance and now plan to "re-eval" with new findings and will have informal discussion on  Friday to determine new target d/c date and any revision of goals that may be needed.    Revisions to Treatment Plan:  Revisions TBD    Continued Need for Acute Rehabilitation Level of Care: The patient requires daily medical management by a physician with specialized training in physical medicine and rehabilitation for the following conditions: Daily direction of a multidisciplinary physical rehabilitation program to ensure safe treatment while eliciting the highest outcome that is of practical value to the patient.: Yes Daily medical management of patient stability for increased activity during participation in an intensive rehabilitation regime.: Yes Daily analysis of laboratory values and/or radiology reports with any subsequent need for medication adjustment of medical intervention for : Neurological problems;Other  [name] [name] 04/26/2018, 12:59 PM

## 2018-04-26 NOTE — Progress Notes (Signed)
Occupational Therapy Session Note  Patient Details  Name: [name] [name] MRN: [id number] Date of Birth: 1966/07/31  Today's Date: 04/26/2018 OT Individual Time: [id number] OT Individual Time Calculation (min): 30 min    Short Term Goals: Week 3:  OT Short Term Goal 1 (Week 3): Continue working on established LTGs set at supervision to min assist level  Skilled Therapeutic Interventions/Progress Updates:    Pt in bed at start of session [facility].  Therapist assisted with donning brief with pt rolling side to side with min assist.  Next had pt transition from supine to sit EOB with min assist.  Worked on donning shorts sit to stand.  He was able to donn them over his feet but needed max assist once standing to pull pants over hips.  He then transferred to the wheelchair with mod assist and therapist transported him down to the therapy gym.  Worked on functional reach and coordination with use of clothespins.  Had pt reach with the RUE to pick up and place clothespins with different resistances.  Min assist for reaching at eye level as he would fatigue to help avoid shoulder compensation and left lean.  Finished session with return to the room and pt staying up in the chair with hopes to go outside with his friend.  Call button in reach and grounds pass ordered from PA.    Therapy Documentation Precautions:  Precautions Precautions: Fall Required Braces or Orthoses: Cervical Brace Cervical Brace: Hard collar Restrictions Weight Bearing Restrictions: No Other Position/Activity Restrictions: cervical collar can be donned in sitting, removed for showering, and removed while in bed per orders  Pain: Pain Assessment Pain Scale: Faces Pain Type: Acute pain Pain Location: Knee Pain Orientation: Right Pain Descriptors / Indicators: Discomfort Pain Onset: With Activity Pain Intervention(s): Repositioned ADL: See Function Navigator for Current Functional  Status.   Therapy/Group: Individual Therapy  Keaten Mashek OTR/L 04/26/2018, 4:25 PM

## 2018-04-26 NOTE — Progress Notes (Addendum)
Occupational Therapy Weekly Progress Note  Patient Details  Name: [name] [name] MRN: [id number] Date of Birth: 1966/02/13  Beginning of progress report period: April 19, 2018 End of progress report period: April 26, 2018  Today's Date: 04/26/2018 OT Individual Time: [id number] OT Individual Time Calculation (min): 64 min    Patient has demonstrated some regression over the past week secondary to new right knee pain and meniscal tear.  He was up to almost supervision level for selfcare tasks and transfers, but now secondary to knee pain and weakness, he requires mod assist for LB selfcare sit to stand and supervision for UB selfcare.  Mod assist for transfers to the toilet and walk-in shower as well.  He continues with BUE weakness and decreased coordination, but demonstrates more severe limitations in the RUE and hand.  Shoulder flexion in the RUE is 0-80 degrees before demonstrating trunk compensations.  Gross grasp and release are present in the hand with grip strength at 3+/5  Feel his LTGs will likely need to be downgraded secondary to new knee injury.  He will likely need min to min guard assist for transfers and selfcare at home.  Will continue with current OT POC until expected discharge next week TBD.   Patient continues to demonstrate the following deficits: muscle weakness, impaired timing and sequencing and decreased standing balance and decreased balance strategies and therefore will continue to benefit from skilled OT intervention to enhance overall performance with BADL and Reduce care partner burden.  Patient not progressing toward long term goals.  See goal revision..  Continue plan of care.  OT Short Term Goals Week 3:  OT Short Term Goal 1 (Week 3): Continue working on established LTGs set at supervision to min assist level  Skilled Therapeutic Interventions/Progress Updates:    Pt completed transfer from supine to sit EOB with min assist.  RW utilized for bed to tub bench  transfer with mod assist.  Pt relying heavily on UE support and therapist assist during transfer.  Noted RLE buckling on several occasions.  Supervision for UB bathing and UB dressing.  Mod assist for LB bathing and dressing with use of the reacher, LH sponge, and sockaide.  He was able to thread his LB clothing with AE and supervision.  Finished session with grooming tasks from wheelchair level with supervision.  Pt left in the wheelchair with call button and phone in reach.    Therapy Documentation Precautions:  Precautions Precautions: Fall Required Braces or Orthoses: Cervical Brace Cervical Brace: Hard collar Restrictions Weight Bearing Restrictions: No Other Position/Activity Restrictions: cervical collar can be donned in sitting, removed for showering, and removed while in bed per orders  Pain: Pain Assessment Pain Scale: Faces Faces Pain Scale: Hurts a little bit Pain Type: Acute pain Pain Location: Knee Pain Orientation: Right Pain Descriptors / Indicators: Discomfort Pain Onset: With Activity Pain Intervention(s): Repositioned Multiple Pain Sites: No ADL: See Function Navigator for Current Functional Status.   Therapy/Group: Individual Therapy  [name] [name] OTR/L  04/26/2018, 12:19 PM

## 2018-04-27 ENCOUNTER — Inpatient Hospital Stay (HOSPITAL_COMMUNITY): Admitting: Physical Therapy

## 2018-04-27 ENCOUNTER — Inpatient Hospital Stay (HOSPITAL_COMMUNITY): Admitting: Occupational Therapy

## 2018-04-27 LAB — URINE CULTURE: CULTURE: NO GROWTH

## 2018-04-27 NOTE — Progress Notes (Signed)
Occupational Therapy Session Note  Patient Details  Name: [name] [name] MRN: [id number] Date of Birth: June 06, 1966  Today's Date: 04/27/2018 OT Individual Time: 0800-0900 OT Individual Time Calculation (min): 60 min    Short Term Goals: Week 3:  OT Short Term Goal 1 (Week 3): Continue working on established LTGs set at supervision to min assist level  Skilled Therapeutic Interventions/Progress Updates:    Pt completed toileting, showering, and dressing sit to stand during session.  He was able to transition from supine to sit from EOB with min assist.  Min assist for completion of toilet transfer with the RW.  He was able to manage clothing with min assist as well as completion of hygiene.  He then transferred over to the tub bench with min assist.  Bathing sit to stand with min assist as well before transferring out to the wheelchair for dressing.  Pt wore his hinged knee brace for all transfers with max assist for donning and doffing this session.  He completed UB dressing with setup and LB dressing with min assist with use of sockaide.    Therapy Documentation Precautions:  Precautions Precautions: Fall Required Braces or Orthoses: Cervical Brace Cervical Brace: Hard collar Restrictions Weight Bearing Restrictions: No Other Position/Activity Restrictions: cervical collar can be donned in sitting, removed for showering, and removed while in bed per orders   Pain: Pain Assessment Pain Scale: Faces Faces Pain Scale: No hurt Pain Type: Acute pain Pain Location: Knee Pain Orientation: Right Pain Descriptors / Indicators: Discomfort Pain Onset: With Activity Pain Intervention(s): Repositioned ADL: See Function Navigator for Current Functional Status.   Therapy/Group: Individual Therapy  Chibuikem Thang OTR/L 04/27/2018, 12:12 PM

## 2018-04-27 NOTE — Progress Notes (Signed)
Physical Therapy Session Note  Patient Details  Name: [name] [name] MRN: [id number] Date of Birth: [birth year]  Today's Date: 04/27/2018 PT Individual Time: 1005-1100 AND 1600-1630 PT Individual Time Calculation (min): 55 min AND 30 min   Short Term Goals: Week 3:  PT Short Term Goal 1 (Week 3): STG =LTG due to setimated D/C date   Skilled Therapeutic Interventions/Progress Updates:   Pt received sitting in WC and agreeable to PT. WC mobility to rehab gym x 156f with supervision assist from PT.   Sit<>stand prior to gait training and throughout treatment with supervision assist from PT and UE support on RW, no knee instability noted. PT instructed pt in Gait training with, min assist, RW, and R bledsoe brace x 1430f Mild R KE foot drag noted intermittently thruoghout gait, but not knee instability reported by pt or seen by PT.   Stair management training with BUE support on bil rail x 4 with min assist. Stair training also performed with BUE on the R rail to simulate home environment, min assist provided by PT. Min verbal and visual instruction for step to gait pattern.   Stand pivot transfer to mat table with supervision assist, RW, and bledsoe brace on the RLE. Sit>supine completed with supervision assist and min cues for safety and positioning of the RLE.   PT instructed pt in supine therex:  Bridge x 10 with 2 sec hold SAQ x 10 BLE with 3 sec hold  SLR x 10 BLE, pt noted to have increased ROM on the  L compared to the R.  Clam shells with manual resistance x 12.  Cues throughout therex for increased ROM on the R to improve neuromotor recruitment and maintain symmetry.   Pt returned to room and performed stand pivot transfer to bed with supervision assist and RW. Sit>supine completed with supervision assist, and pt left supine in bed with call bell in reach and all needs met.    Session 2.  Pt received supine in bed and agreeable to PT, noted to have bledsoe brace donned at  start of session. Supine>sit transfer with min assist and min cues for use of bed features.   Sit<>stand from EOB with min assist from PT and increased time with UE support on RW. Gait training x 10055fith min assist from PT for safety with min cues for improved step height on the R to prevent foot drag and reduce fall risk. Pt reports need for BM during gait training.   Ambulatory transfer to bathroom with min assist and UE support on RW. Pt noted to have continent bowel movement, RN aware. Mod assist to stand from toilet. PT performed peri care for pt standing at toilet. Ambulatory transfer back to EOB with min assist and RW.   Seated therex : LAQ x 12 BLE with 3 sec hold Reciprocal marches. X 12 BLE Calf raises. X 25 BLE  Sit>supine completed with supervision assist, and left supine in bed with call bell in reach and all needs met.        Therapy Documentation Precautions:  Precautions Precautions: Fall Required Braces or Orthoses: Cervical Brace Cervical Brace: Hard collar Restrictions Weight Bearing Restrictions: No Other Position/Activity Restrictions: cervical collar can be donned in sitting, removed for showering, and removed while in bed per orders Pain: Pain Assessment Pain Scale: 0-10 Pain Score: 0-No pain  See Function Navigator for Current Functional Status.   Therapy/Group: Individual Therapy  AusLorie Phenix15/2019, 11:01 AM

## 2018-04-27 NOTE — Progress Notes (Signed)
[facility]IC[facility]ILIT[facility]     PROGRESS NOTE  Subjective/Complaints:  Received brace yesterday. Knee feeling better today. Was up on brace last night. No diarrhea yesterday  ROS: Patient denies fever, rash, sore throat, blurred vision, nausea, vomiting, diarrhea, cough, shortness of breath or chest pain,  headache, or mood change.   Objective: Vital Signs: Blood pressure 124/79, pulse 66, temperature 98 F (36.7 C), temperature source Oral, resp. rate 17, height 5\' 9"  (1.753 m), weight 118 kg, SpO2 99 %. Dg Chest 2 View  Result Date: 04/26/2018 CLINICAL DATA:  Fever EXAM: CHEST - 2 VIEW COMPARISON:  03/25/2018 chest radiograph and 03/27/2018 CT FINDINGS: The cardiomediastinal silhouette is unremarkable. There is no evidence of focal airspace disease, pulmonary edema, suspicious pulmonary nodule/mass, pleural effusion, or pneumothorax. Mild chronic peribronchial thickening again noted. No acute bony abnormalities are identified. IMPRESSION: No evidence of acute cardiopulmonary disease. Electronically Signed   By: Harmon PierJeffrey  Hu M.D.   On: 04/26/2018 10:35   Mr Knee Right Wo Contrast  Result Date: 04/26/2018 CLINICAL DATA:  Right knee pain and weakness with falls EXAM: MRI OF THE RIGHT KNEE WITHOUT CONTRAST TECHNIQUE: Multiplanar, multisequence MR imaging of the knee was performed. No intravenous contrast was administered. COMPARISON:  None. FINDINGS: MENISCI Medial meniscus: Complex tear of the posterior horn of the medial meniscus. Lateral meniscus: Possible tiny undersurface tear along the posterior horn-body junction of lateral meniscus. LIGAMENTS Cruciates:  Intact ACL and PCL. Collaterals: Medial collateral ligament is intact. Lateral collateral ligament complex is intact. CARTILAGE Patellofemoral: Partial-thickness cartilage loss of the patellofemoral compartment. Medial: Full-thickness cartilage loss of medial femorotibial compartment. Lateral: Mild partial-thickness cartilage  loss of the lateral femorotibial compartment. Joint:  Large joint effusion.  Normal Hoffa's fat. Popliteal Fossa:  No Baker cyst. Intact popliteus tendon. Extensor Mechanism: Intact quadriceps tendon. Intact patellar tendon. Intact medial patellar retinaculum. Intact lateral patellar retinaculum. Intact MPFL. Bones: No focal marrow signal abnormality. No fracture or dislocation. Other: No fluid collection or hematoma.  Muscles are normal. IMPRESSION: 1. Complex tear of the posterior horn of the medial meniscus. 2. Possible tiny undersurface tear along the posterior horn-body junction of lateral meniscus. 3. Full-thickness cartilage loss of medial femorotibial compartment. 4. Mild partial-thickness cartilage loss of the lateral femorotibial compartment. 5. Large joint effusion. Electronically Signed   By: Elige KoHetal  Patel   On: 04/26/2018 10:14   Recent Labs    04/24/18 1007 04/26/18 0926  WBC 5.6 7.1  HGB 11.5* 11.3*  HCT 37.4* 35.9*  PLT 187 218   Recent Labs    04/24/18 1007 04/26/18 0926  NA 143 142  K 3.7 4.2  CL 107 106  GLUCOSE 121* 118*  BUN 20 15  CREATININE 1.24 1.11  CALCIUM 9.1 9.4   CBG (last 3)  No results for input(s): GLUCAP in the last 72 hours.  Wt Readings from Last 3 Encounters:  04/26/18 118 kg  03/23/18 (!) 139.1 kg    Physical Exam:  BP 124/79 (BP Location: Left Arm)   Pulse 66   Temp 98 F (36.7 C) (Oral)   Resp 17   Ht 5\' 9"  (1.753 m)   Wt 118 kg   SpO2 99%   BMI 38.42 kg/m  Constitutional: No distress . Vital signs reviewed. HEENT: EOMI, oral membranes moist Neck: supple Cardiovascular: RRR without murmur. No JVD    Respiratory: CTA Bilaterally without wheezes or rales. Normal effort    GI: BS +, non-tender, non-distended  Musculoskeletal: Right knee with  effusion, decr pain with palpation Neurological: He isalertand oriented  Motor:  RUE: 4+/5 prox to distal  LUE: 5/5 proximal to distal  RLE: limited due to pain LLE: 4-/5 hip flexion,  4/5 knee extension, 5/5 ankle dorsiflexion, stable Skin:Incision site remains c/d/i Sacral wound remains dressed  Assessment/Plan: 1. Functional deficits secondary to cervical myelopathy with quadriparesis which require 3+ hours per day of interdisciplinary therapy in a comprehensive inpatient rehab setting. Physiatrist is providing close team supervision and 24 hour management of active medical problems listed below. Physiatrist and rehab team continue to assess barriers to discharge/monitor patient progress toward functional and medical goals.  Function:  Bathing Bathing position   Position: Shower  Bathing parts Body parts bathed by patient: Right arm, Left arm, Chest, Abdomen, Right upper leg, Left upper leg, Right lower leg, Left lower leg, Back Body parts bathed by helper: Buttocks, Front perineal area  Bathing assist Assist Level: Touching or steadying assistance(Pt > 75%) Assistive Device Comment: lh sponge    Upper Body Dressing/Undressing Upper body dressing   What is the patient wearing?: Pull over shirt/dress     Pull over shirt/dress - Perfomed by patient: Thread/unthread right sleeve, Thread/unthread left sleeve, Put head through opening, Pull shirt over trunk Pull over shirt/dress - Perfomed by helper: Put head through opening, Pull shirt over trunk        Upper body assist Assist Level: Supervision or verbal cues   Set up : To obtain clothing/put away  Lower Body Dressing/Undressing Lower body dressing   What is the patient wearing?: Pants, Non-skid slipper socks     Pants- Performed by patient: Thread/unthread right pants leg, Thread/unthread left pants leg Pants- Performed by helper: Pull pants up/down Non-skid slipper socks- Performed by patient: Don/doff right sock, Don/doff left sock Non-skid slipper socks- Performed by helper: Don/doff right sock, Don/doff left sock               TED Hose - Performed by helper: Don/doff right TED hose, Don/doff  left TED hose  Lower body assist Assist for lower body dressing: Touching or steadying assistance (Pt > 75%)      Toileting Toileting Toileting activity did not occur: Safety/medical concerns Toileting steps completed by patient: Adjust clothing prior to toileting, Adjust clothing after toileting Toileting steps completed by helper: Adjust clothing after toileting, Adjust clothing prior to toileting, Performs perineal hygiene Toileting Assistive Devices: Grab bar or rail  Toileting assist Assist level: Touching or steadying assistance (Pt.75%)   Transfers Chair/bed transfer   Chair/bed transfer method: Squat pivot Chair/bed transfer assist level: Moderate assist (Pt 50 - 74%/lift or lower) Chair/bed transfer assistive device: Armrests, Patent attorneyWalker     Locomotion Ambulation     Max distance: 10' Assist level: Supervision or verbal cues   Wheelchair   Type: Manual Max wheelchair distance: 150 Assist Level: Supervision or verbal cues  Cognition Comprehension Comprehension assist level: Follows complex conversation/direction with extra time/assistive device  Expression Expression assist level: Expresses complex 90% of the time/cues < 10% of the time  Social Interaction Social Interaction assist level: Interacts appropriately 90% of the time - Needs monitoring or encouragement for participation or interaction.  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 Cervical collar as directed  Cont CIR   -think d/c 8/16 is reasonable given presentation this morning 2. DVT Prophylaxis/Anticoagulation/bilateral pulmonary  emboli/bilateral posterior tibial peroneal vein ZOX:WRUEAVW 60 mg every 12 hours x1 week, transitioned to Eliquis 5 mg twice a day on 8/7, will need to monitor closely for bleeding  Tolerating oral anticoagulation at present  3. Pain Management:Baclofen 10 mg twice daily, Flexeril and oxycodone as needed  - knee xr shows significant medial compartment OA  -MRI demonstrates left posterior horn meniscus tear, OA medial compartment, effusion. All imaging reviewed  -scheduled Ice  -has improved with knee brace, prednisone  -outpt ortho follow up  -knee injection if needed 4. Mood:Provide emotional support 5. Neuropsych: This patientiscapable of making decisions on hisown behalf. 6. Skin/Wound Care:Appreciate WOC input for moisture associated sacral wound 7. Fluids/Electrolytes/Nutrition:Routine in and outs  BMP within acceptable range on 8/12 8.Neurogenic bowel and bladder. Patient now with loose stool  -Held Colace, Imodium as needed 9.Hypertension./ Cozaar 25 mg daily.   improving control 10.Morbid obesity. BMI 45.29. Dietary follow-up 11.History of pernicious anemia with ABLA  Hemoglobin 11.5 8/12    12. Thrombocytopenia  Platelets 187 8/12    HIT within normal limits on 7/31  Hemoccult negative on 8/1, positive on 8/3, negative again on 8/4 13. Hypoalbuminemia  Supplement initiated on 8/1 14. Sleep disturbance  Possibly associated with PTSD  ECG reviewed, with borderline QT prolongation  Cont restoril 7.5 daily  15. Loose stool: remains hit and miss  Colace stopped. D/c sugar protein supp  Cont probiotic  Changed protonix to pepcid  Prn imodium  -?rare SE of decadron---d/c'ed  -?IBS---consider bentyl if recurrent 16. Low grade temp:  -afebrile x 24 hours  -all labs and imaging negative for infectious process.   -may be related to DVT      LOS (Days) 16 A FACE TO FACE EVALUATION WAS PERFORMED  Ranelle Oyster 04/27/2018 9:25 AM

## 2018-04-27 NOTE — Progress Notes (Signed)
Occupational Therapy Session Note  Patient Details  Name: [name] [name] MRN: [id number] Date of Birth: 1966/05/02  Today's Date: 04/27/2018 OT Individual Time: 1300-1345 OT Individual Time Calculation (min): 45 min    Short Term Goals: Week 1:  OT Short Term Goal 1 (Week 1): Pt will complete LB bathing with min assist sit to stand.   OT Short Term Goal 1 - Progress (Week 1): Met OT Short Term Goal 2 (Week 1): Pt completed LB dressing with min assist sit to stand with AE.   OT Short Term Goal 2 - Progress (Week 1): Other (comment)(Pt refusing LB dressing due to inconsistent contentcy with bowels) OT Short Term Goal 3 (Week 1): Pt completed toilet transfer with min assist using the RW and elevated toilet. OT Short Term Goal 3 - Progress (Week 1): Met OT Short Term Goal 4 (Week 1): Pt will complete  tub shower transfer with min assist using the RW for support.  OT Short Term Goal 4 - Progress (Week 1): Met  Skilled Therapeutic Interventions/Progress Updates: patient c/o fatigue but concurred to participate in skilled OT as follows:   R UE PROM and bilateral digitial dexterity and FM skills.     Therapy Documentation Precautions:  Precautions Precautions: Fall Required Braces or Orthoses: Cervical Brace Cervical Brace: Hard collar Restrictions Weight Bearing Restrictions: No Other Position/Activity Restrictions: cervical collar can be donned in sitting, removed for showering, and removed while in bed per orders   Pain:denied     Therapy/Group: Individual Therapy  Alfredia Ferguson [facility] 04/27/2018, 10:47 PM

## 2018-04-27 NOTE — Plan of Care (Signed)
  Problem: SCI BOWEL ELIMINATION Goal: RH STG MANAGE BOWEL WITH ASSISTANCE Description STG Manage Bowel with min Assistance.  Outcome: Progressing Flowsheets (Taken 04/27/2018 1552) STG: Pt will manage bowels with assistance: 6-Modified independent   Problem: SCI BLADDER ELIMINATION Goal: RH STG MANAGE BLADDER WITH ASSISTANCE Description STG Manage Bladder With Min Assistance  Outcome: Progressing Flowsheets (Taken 04/27/2018 1552) STG: Pt will manage bladder with assistance: 5-Supervision/set up   Problem: RH SKIN INTEGRITY Goal: RH STG SKIN FREE OF INFECTION/BREAKDOWN Description Mod I  Outcome: Progressing   Problem: RH SAFETY Goal: RH STG ADHERE TO SAFETY PRECAUTIONS W/ASSISTANCE/DEVICE Description STG Adhere to Safety Precautions With min Assistance/Device.  Outcome: Progressing Flowsheets (Taken 04/27/2018 1552) STG:Pt will adhere to safety precautions with assistance/device: 4-Minimal assistance   Problem: RH PAIN MANAGEMENT Goal: RH STG PAIN MANAGED AT OR BELOW PT'S PAIN GOAL Description <2  Outcome: Progressing

## 2018-04-28 ENCOUNTER — Inpatient Hospital Stay (HOSPITAL_COMMUNITY): Admitting: Physical Therapy

## 2018-04-28 ENCOUNTER — Inpatient Hospital Stay (HOSPITAL_COMMUNITY)

## 2018-04-28 DIAGNOSIS — M1711 Unilateral primary osteoarthritis, right knee: Secondary | ICD-10-CM

## 2018-04-28 MED ORDER — PREDNISONE 10 MG PO TABS
10.0000 mg | ORAL_TABLET | Freq: Two times a day (BID) | ORAL | Status: DC
  Administered 2018-04-30: 10 mg via ORAL
  Filled 2018-04-28: qty 1

## 2018-04-28 MED ORDER — PREDNISONE 20 MG PO TABS
20.0000 mg | ORAL_TABLET | Freq: Two times a day (BID) | ORAL | Status: AC
  Administered 2018-04-28 – 2018-04-29 (×3): 20 mg via ORAL
  Filled 2018-04-28 (×3): qty 1

## 2018-04-28 MED ORDER — PREDNISONE 20 MG PO TABS: 20.0000 mg | ORAL_TABLET | Freq: Two times a day (BID) | ORAL | Status: DC

## 2018-04-28 NOTE — Plan of Care (Signed)
  Problem: RH SAFETY Goal: RH STG ADHERE TO SAFETY PRECAUTIONS W/ASSISTANCE/DEVICE Description STG Adhere to Safety Precautions With min Assistance/Device.  Outcome: Progressing  Call light within reach, bed alarm  

## 2018-04-28 NOTE — Progress Notes (Signed)
Social Work Patient ID: [name] [name], male   DOB: 05-20-1966, 52 y.o.   MRN: [id number]  Have discussed pt's current LOF with pt, MD and therapists.  Doing much better now with knee brace.  Therapies feel he will be ready for d/c on Tuesday 8/20 with new goals set for mod ind - supervision overall.  Planning family ed with parents on the morning of d/c day.  Have alerted insurance to new targets as well.  Shaaron Golliday, LCSW

## 2018-04-28 NOTE — Progress Notes (Signed)
Physical Therapy Session Note  Patient Details  Name: [name] [name] MRN: [id number] Date of Birth: 24-Jun-1966  Today's Date: 04/28/2018 PT Individual Time: 1100-1200 AND 1615-1700   60 min and 45 min   Short Term Goals: Week 1:  PT Short Term Goal 1 (Week 1): Pt will perform bed mobility with supervision assist  PT Short Term Goal 1 - Progress (Week 1): Met PT Short Term Goal 2 (Week 1): Pt will ambulate 12f with min assist  PT Short Term Goal 2 - Progress (Week 1): Met PT Short Term Goal 3 (Week 1): Pt will perform sit<>stand with min assist consistently PT Short Term Goal 3 - Progress (Week 1): Met PT Short Term Goal 4 (Week 1): Pt will initiate stair training  PT Short Term Goal 4 - Progress (Week 1): Met Week 2:  PT Short Term Goal 1 (Week 2): STG=LTg due to ELOS Week 3:  PT Short Term Goal 1 (Week 3): STG =LTG due to setimated D/C date   Skilled Therapeutic Interventions/Progress Updates:   Pt received sitting in WC and agreeable to PT  WC mobility instucted by PT x 1526fwith supervision assist from PT. Standing balance to assess weight on scale. Stand pivot transfers throughout treatment to various surfaces including Nustep, WC, arm chair and bed. Nustep endurance training level 5 x 10 min with supervision assist from PT. Gait over level surface x 18060fith supervision assist. Gait over unlevle surface x 10 ft. Pt returned to bathroom. Urination while standing at toilet with supervision assist from PT for safety.  Pt performed ambulatory transfer to bed with supervision assist. Sit>supine completed with supervision assist, and left supine in bed with call bell in reach and all needs met.    Session 2.   Pt received supine in bed and agreeable to PT. Supine>sit transfer with  supervision assist and min cues for safety. Gait training in simulated community environement over sidewalkx [id number] through hospital gift shop x 150ft70fh supervision assist from PT with min cues for  AD management and improved step height. 5xSTS 40 sec. With BUE support from arm rests. (>15sec indicates increased fall risk) educated pt in fall risk and need for continued therapy to improve strength, endurance and balance to improve safety of movement up d/c. WC mobility x 150ft 97f supervision assist from PT and min cues for doorway management. Patient returned to room and left sitting in [facility] bell in reach and all needs met.           Therapy Documentation Precautions:  Precautions Precautions: Fall Required Braces or Orthoses: Cervical Brace Cervical Brace: Hard collar Restrictions Weight Bearing Restrictions: No Other Position/Activity Restrictions: cervical collar can be donned in sitting, removed for showering, and removed while in bed per orders Pain: Pain Assessment Pain Scale: 0-10 Pain Score: 0-No pain Faces Pain Scale: No hurt  See Function Navigator for Current Functional Status.   Therapy/Group: Individual Therapy  AustinLorie Phenix2019, 11:42 AM

## 2018-04-28 NOTE — Progress Notes (Signed)
Physical Therapy Session Note  Patient Details  Name: [name] FreedCornell Stough MRN: [id number] Date of Birth: [birth year]  Today's Date: 04/28/2018 PT Individual Time: [id number] PT Individual Time Calculation (min): 30 min   Short Term Goals: Week 2:  PT Short Term Goal 1 (Week 2): STG=LTg due to ELOS  Skilled Therapeutic Interventions/Progress Updates:    Pt received supine in bed, agreeable to PT. No complaints of pain. Dependent to don BLE TED hose and readjust RLE brace. Supine to sit with Supervision with use of bedrails. Pt is able to don shorts seated EOB with SBA. Sit to stand Supervision to RW. Ambulation 2 x 150 ft with RW and Supervision. Seated BLE therex: marches, LAQ, heel/toe raises x 15 reps each. Toilet transfer with Supervision. Pt left seated on toilet with call button in reach.  Therapy Documentation Precautions:  Precautions Precautions: Fall Required Braces or Orthoses: Cervical Brace Cervical Brace: Hard collar Restrictions Weight Bearing Restrictions: No Other Position/Activity Restrictions: cervical collar can be donned in sitting, removed for showering, and removed while in bed per orders  See Function Navigator for Current Functional Status.   Therapy/Group: Individual Therapy  Peter Congoaylor Elize Pinon, PT, DPT  04/28/2018, 12:09 PM

## 2018-04-28 NOTE — Progress Notes (Signed)
Occupational Therapy Session Note  Patient Details  Name: [name] [name] MRN: [id number] Date of Birth: [birth year]  Today's Date: 04/28/2018 OT Individual Time: 1345-1445 OT Individual Time Calculation (min): 60 min    Short Term Goals: Week 3:  OT Short Term Goal 1 (Week 3): Continue working on established LTGs set at supervision to min assist level  Skilled Therapeutic Interventions/Progress Updates:    Pt supine in bed agreeable to therapy with no c./o pain. Session focused on B UE strengthening/conditioning for more purposeful use in ADLs/IADLs and improving B FMC. Pt completed 200 ft of w/c propulsion with mod I, requiring 1 rest break d/t fatigue. Pt propelled himself over uneven surfaces outdoors and over doorway threshold with (S). Pt participated in [facility] training and writing to carryover to work related demands. Several options were trialed including levels of built up handles and writing mediums. Recommendations made re pencil grippers to improve precision and legibility. Pt completed standing level toileting with CGA, using RW to complete 15 ft of functional mobility to and from bathroom.  Pt was left supine in bed with all needs met.   Therapy Documentation Precautions:  Precautions Precautions: Fall Required Braces or Orthoses: Cervical Brace Cervical Brace: Hard collar Restrictions Weight Bearing Restrictions: No Other Position/Activity Restrictions: cervical collar can be donned in sitting, removed for showering, and removed while in bed per orders   Vital Signs: Therapy Vitals Temp: 98.3 F (36.8 C) Temp Source: Oral Pulse Rate: 79 Resp: 20 BP: (!) 145/117 Patient Position (if appropriate): Sitting Oxygen Therapy SpO2: 99 % O2 Device: Room Air Pain: Pain Assessment Pain Scale: 0-10 Pain Score: 0-No pain ent Functional Status.   Therapy/Group: Individual Therapy  Curtis Sites 04/28/2018, 3:30 PM

## 2018-04-28 NOTE — Plan of Care (Signed)
PT goals upgraded due to pt progress. See care plan.

## 2018-04-28 NOTE — Progress Notes (Addendum)
[place] PHYSICAL MEDICINE & REHABILITATION     PROGRESS NOTE  Subjective/Complaints:  Continues to do well. Pain has improved. Stools more formed. Brace fitting well  ROS: Patient denies fever, rash, sore throat, blurred vision, nausea, vomiting, diarrhea, cough, shortness of breath or chest pain, joint or back pain, headache, or mood change.    Objective: Vital Signs: Blood pressure 130/77, pulse (!) 45, temperature 98.2 F (36.8 C), temperature source Oral, resp. rate 19, height 5\' 9"  (1.753 m), weight 118 kg, SpO2 100 %. Dg Chest 2 View  Result Date: 04/26/2018 CLINICAL DATA:  Fever EXAM: CHEST - 2 VIEW COMPARISON:  03/25/2018 chest radiograph and 03/27/2018 CT FINDINGS: The cardiomediastinal silhouette is unremarkable. There is no evidence of focal airspace disease, pulmonary edema, suspicious pulmonary nodule/mass, pleural effusion, or pneumothorax. Mild chronic peribronchial thickening again noted. No acute bony abnormalities are identified. IMPRESSION: No evidence of acute cardiopulmonary disease. Electronically Signed   By: Harmon PierJeffrey  Hu M.D.   On: 04/26/2018 10:35   Recent Labs    04/26/18 0926  WBC 7.1  HGB 11.3*  HCT 35.9*  PLT 218   Recent Labs    04/26/18 0926  NA 142  K 4.2  CL 106  GLUCOSE 118*  BUN 15  CREATININE 1.11  CALCIUM 9.4   CBG (last 3)  No results for input(s): GLUCAP in the last 72 hours.  Wt Readings from Last 3 Encounters:  04/26/18 118 kg  03/23/18 (!) 139.1 kg    Physical Exam:  BP 130/77 (BP Location: Right Arm)   Pulse (!) 45   Temp 98.2 F (36.8 C) (Oral)   Resp 19   Ht 5\' 9"  (1.753 m)   Wt 118 kg   SpO2 100%   BMI 38.42 kg/m  Constitutional: No distress . Vital signs reviewed. HEENT: EOMI, oral membranes moist Neck: supple Cardiovascular: RRR without murmur. No JVD    Respiratory: CTA Bilaterally without wheezes or rales. Normal effort    GI: BS +, non-tender, non-distended  Musculoskeletal: Right knee with ongoing  effusion, decreased pain, Bledsoe in place Neurological: He isalertand oriented  Motor:  RUE: 4+/5 prox to distal  LUE: 5/5 proximal to distal  RLE: limited due to pain LLE: 4-/5 hip flexion, 4/5 knee extension, 5/5 ankle dorsiflexion, stable Skin:Incision site remains c/d/i Sacral wound remains dressed Psych: pleasant  Assessment/Plan: 1. Functional deficits secondary to cervical myelopathy with quadriparesis which require 3+ hours per day of interdisciplinary therapy in a comprehensive inpatient rehab setting. Physiatrist is providing close team supervision and 24 hour management of active medical problems listed below. Physiatrist and rehab team continue to assess barriers to discharge/monitor patient progress toward functional and medical goals.  Function:  Bathing Bathing position   Position: Shower  Bathing parts Body parts bathed by patient: Right arm, Left arm, Chest, Abdomen, Right upper leg, Left upper leg, Right lower leg, Left lower leg, Back, Front perineal area, Buttocks Body parts bathed by helper: Buttocks, Front perineal area  Bathing assist Assist Level: Touching or steadying assistance(Pt > 75%) Assistive Device Comment: lh sponge    Upper Body Dressing/Undressing Upper body dressing   What is the patient wearing?: Pull over shirt/dress     Pull over shirt/dress - Perfomed by patient: Thread/unthread right sleeve, Thread/unthread left sleeve, Put head through opening, Pull shirt over trunk Pull over shirt/dress - Perfomed by helper: Put head through opening, Pull shirt over trunk        Upper body assist Assist Level: Supervision  or verbal cues   Set up : To obtain clothing/put away  Lower Body Dressing/Undressing Lower body dressing   What is the patient wearing?: Pants, Non-skid slipper socks     Pants- Performed by patient: Thread/unthread right pants leg, Thread/unthread left pants leg, Pull pants up/down Pants- Performed by helper: Pull pants  up/down Non-skid slipper socks- Performed by patient: Don/doff right sock, Don/doff left sock Non-skid slipper socks- Performed by helper: Don/doff right sock, Don/doff left sock               TED Hose - Performed by helper: Don/doff right TED hose, Don/doff left TED hose  Lower body assist Assist for lower body dressing: Touching or steadying assistance (Pt > 75%)      Toileting Toileting Toileting activity did not occur: Safety/medical concerns Toileting steps completed by patient: Adjust clothing prior to toileting, Performs perineal hygiene, Adjust clothing after toileting Toileting steps completed by helper: Adjust clothing after toileting, Adjust clothing prior to toileting, Performs perineal hygiene Toileting Assistive Devices: Grab bar or rail  Toileting assist Assist level: Touching or steadying assistance (Pt.75%)   Transfers Chair/bed transfer   Chair/bed transfer method: Ambulatory Chair/bed transfer assist level: Supervision or verbal cues Chair/bed transfer assistive device: Patent attorney     Max distance: 140t Assist level: Touching or steadying assistance (Pt > 75%)   Wheelchair   Type: Manual Max wheelchair distance: 167ft Assist Level: Supervision or verbal cues  Cognition Comprehension Comprehension assist level: Follows complex conversation/direction with extra time/assistive device  Expression Expression assist level: Expresses complex 90% of the time/cues < 10% of the time  Social Interaction Social Interaction assist level: Interacts appropriately 90% of the time - Needs monitoring or encouragement for participation or interaction.  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 Cervical collar as directed  Cont CIR   -working toward dc 8/20  2. DVT Prophylaxis/Anticoagulation/bilateral pulmonary emboli/bilateral posterior tibial peroneal vein ZOX:WRUEAVW 60 mg every 12 hours x1 week, transitioned to Eliquis 5 mg twice a day on 8/7, will need to monitor closely for bleeding  Tolerating oral anticoagulation at present  3. Pain Management:Baclofen 10 mg twice daily, Flexeril and oxycodone as needed  - knee xr shows significant medial compartment OA  -MRI demonstrates left posterior horn meniscus tear, OA medial compartment, effusion. All imaging reviewed  -continue scheduled Ice  -has improved with knee brace, prednisone---taper prednisone off  -outpt ortho follow up  -knee injection if needed 4. Mood:Provide emotional support 5. Neuropsych: This patientiscapable of making decisions on hisown behalf. 6. Skin/Wound Care:Appreciate WOC input for moisture associated sacral wound 7. Fluids/Electrolytes/Nutrition:Routine in and outs  BMP within acceptable range on 8/14 8.Neurogenic bowel and bladder. Patient now with loose stool  -Held Colace, Imodium as needed 9.Hypertension./ Cozaar 25 mg daily.   improving control 10.Morbid obesity. BMI 45.29. Dietary follow-up 11.History of pernicious anemia with ABLA  Hemoglobin 11.3 8/14    12. Thrombocytopenia  Platelets 218 8/14    HIT within normal limits on 7/31  Hemoccult negative on 8/1, positive on 8/3, negative again on 8/4 13. Hypoalbuminemia  Supplement initiated on 8/1 14.  Sleep disturbance  Possibly associated with PTSD  ECG reviewed, with borderline QT prolongation  Cont restoril 7.5 daily  15. Loose stool: has improved over last 48 hours  Off softeners and supplements  Cont probiotic  Changed protonix to pepcid  Prn imodium  -?rare SE of decadron---since this was  stopped, stools have improved  -also have to consider IBS   16. Low grade temp:  -afebrile x 48+ hours  -all labs and imaging negative for infectious process.   -may be related to DVT      LOS (Days) 17 A FACE TO FACE EVALUATION WAS PERFORMED  Ranelle OysterZachary T Swartz 04/28/2018 9:40 AM

## 2018-04-28 NOTE — Plan of Care (Signed)
  Problem: SCI BOWEL ELIMINATION Goal: RH STG MANAGE BOWEL WITH ASSISTANCE Description STG Manage Bowel with min Assistance.  Outcome: Progressing   Problem: SCI BLADDER ELIMINATION Goal: RH STG MANAGE BLADDER WITH ASSISTANCE Description STG Manage Bladder With Min Assistance  Outcome: Progressing   Problem: RH SKIN INTEGRITY Goal: RH STG SKIN FREE OF INFECTION/BREAKDOWN Description Mod I  Outcome: Progressing   Problem: RH SAFETY Goal: RH STG ADHERE TO SAFETY PRECAUTIONS W/ASSISTANCE/DEVICE Description STG Adhere to Safety Precautions With min Assistance/Device.  Outcome: Progressing   Problem: RH PAIN MANAGEMENT Goal: RH STG PAIN MANAGED AT OR BELOW PT'S PAIN GOAL Description <2  Outcome: Progressing

## 2018-04-29 ENCOUNTER — Inpatient Hospital Stay (HOSPITAL_COMMUNITY): Admitting: Physical Therapy

## 2018-04-29 NOTE — Progress Notes (Signed)
Physical Therapy Session Note  Patient Details  Name: [name] [name] MRN: [id number] Date of Birth: 1966-03-17  Today's Date: 04/29/2018 PT Individual Time: 0900-0930 PT Individual Time Calculation (min): 30 min   Short Term Goals: Week 1:  PT Short Term Goal 1 (Week 1): Pt will perform bed mobility with supervision assist  PT Short Term Goal 1 - Progress (Week 1): Met PT Short Term Goal 2 (Week 1): Pt will ambulate 14f with min assist  PT Short Term Goal 2 - Progress (Week 1): Met PT Short Term Goal 3 (Week 1): Pt will perform sit<>stand with min assist consistently PT Short Term Goal 3 - Progress (Week 1): Met PT Short Term Goal 4 (Week 1): Pt will initiate stair training  PT Short Term Goal 4 - Progress (Week 1): Met  Skilled Therapeutic Interventions/Progress Updates:  Pt was seen bedside in the am. Pt dependent to don B TED hoses. Pt transferred supine to edge of bed with head of bed elevated, side rail and S. Pt donned shorts at edge of bed with S. Pt transferred sit to stand multiple times with rolling walker and S. Pt propelled w/c about 150 feet with B UEs and S. Pt ambulated 300 feet with rolling walker and S. Pt ambulated and transferred with R hinged knee brace.   Therapy Documentation Precautions:  Precautions Precautions: Fall Required Braces or Orthoses: Cervical Brace Cervical Brace: Hard collar Restrictions Weight Bearing Restrictions: No Other Position/Activity Restrictions: cervical collar can be donned in sitting, removed for showering, and removed while in bed per orders General:   Pain: No c/o pain.   See Function Navigator for Current Functional Status.   Therapy/Group: Individual Therapy  MDub Amis8/17/2019, 12:07 PM

## 2018-04-29 NOTE — Progress Notes (Signed)
[place] PHYSICAL MEDICINE & REHABILITATION     PROGRESS NOTE  Subjective/Complaints:   No new issues Right hand is still weak  ROS: No CP, SOB, N/V/D  Objective: Vital Signs: Blood pressure 131/89, pulse 63, temperature 97.8 F (36.6 C), temperature source Oral, resp. rate 18, height 5\' 9"  (1.753 m), weight 118 kg, SpO2 100 %. No results found. Recent Labs    04/26/18 0926  WBC 7.1  HGB 11.3*  HCT 35.9*  PLT 218   Recent Labs    04/26/18 0926  NA 142  K 4.2  CL 106  GLUCOSE 118*  BUN 15  CREATININE 1.11  CALCIUM 9.4   CBG (last 3)  No results for input(s): GLUCAP in the last 72 hours.  Wt Readings from Last 3 Encounters:  04/26/18 118 kg  03/23/18 (!) 139.1 kg    Physical Exam:  BP 131/89 (BP Location: Right Wrist)   Pulse 63   Temp 97.8 F (36.6 C) (Oral)   Resp 18   Ht 5\' 9"  (1.753 m)   Wt 118 kg   SpO2 100%   BMI 38.42 kg/m  Constitutional: No distress . Vital signs reviewed. HEENT: EOMI, oral membranes moist Neck: supple Cardiovascular: RRR without murmur. No JVD    Respiratory: CTA Bilaterally without wheezes or rales. Normal effort    GI: BS +, non-tender, non-distended  Musculoskeletal: Right knee with ongoing effusion, decreased pain, Bledsoe in place Neurological: He isalertand oriented  Motor:  RUE: 4+/5 prox to distal  LUE: 5/5 proximal to distal  RLE: limited due to pain LLE: 4-/5 hip flexion, 4/5 knee extension, 5/5 ankle dorsiflexion, stable Skin:Incision site remains c/d/i Sacral wound remains dressed Psych: pleasant  Assessment/Plan: 1. Functional deficits secondary to cervical myelopathy with quadriparesis which require 3+ hours per day of interdisciplinary therapy in a comprehensive inpatient rehab setting. Physiatrist is providing close team supervision and 24 hour management of active medical problems listed below. Physiatrist and rehab team continue to assess barriers to discharge/monitor patient progress toward  functional and medical goals.  Function:  Bathing Bathing position   Position: Shower  Bathing parts Body parts bathed by patient: Right arm, Left arm, Chest, Abdomen, Right upper leg, Left upper leg, Right lower leg, Left lower leg, Back, Front perineal area, Buttocks Body parts bathed by helper: Buttocks, Front perineal area  Bathing assist Assist Level: Touching or steadying assistance(Pt > 75%) Assistive Device Comment: lh sponge    Upper Body Dressing/Undressing Upper body dressing   What is the patient wearing?: Pull over shirt/dress     Pull over shirt/dress - Perfomed by patient: Thread/unthread right sleeve, Thread/unthread left sleeve, Put head through opening, Pull shirt over trunk Pull over shirt/dress - Perfomed by helper: Put head through opening, Pull shirt over trunk        Upper body assist Assist Level: Supervision or verbal cues   Set up : To obtain clothing/put away  Lower Body Dressing/Undressing Lower body dressing   What is the patient wearing?: Pants, Non-skid slipper socks     Pants- Performed by patient: Thread/unthread right pants leg, Thread/unthread left pants leg, Pull pants up/down Pants- Performed by helper: Pull pants up/down Non-skid slipper socks- Performed by patient: Don/doff right sock, Don/doff left sock Non-skid slipper socks- Performed by helper: Don/doff right sock, Don/doff left sock               TED Hose - Performed by helper: Don/doff right TED hose, Don/doff left TED hose  Lower  body assist Assist for lower body dressing: Touching or steadying assistance (Pt > 75%)      Toileting Toileting Toileting activity did not occur: Safety/medical concerns Toileting steps completed by patient: Adjust clothing prior to toileting, Performs perineal hygiene, Adjust clothing after toileting Toileting steps completed by helper: Adjust clothing after toileting, Adjust clothing prior to toileting, Performs perineal hygiene Toileting  Assistive Devices: Grab bar or rail  Toileting assist Assist level: Touching or steadying assistance (Pt.75%)   Transfers Chair/bed transfer   Chair/bed transfer method: Ambulatory Chair/bed transfer assist level: Supervision or verbal cues Chair/bed transfer assistive device: Patent attorneyWalker     Locomotion Ambulation     Max distance: 150' Assist level: Supervision or verbal cues   Wheelchair   Type: Manual Max wheelchair distance: 15150ft Assist Level: Supervision or verbal cues  Cognition Comprehension Comprehension assist level: Follows complex conversation/direction with no assist  Expression Expression assist level: Expresses complex ideas: With no assist  Social Interaction Social Interaction assist level: Interacts appropriately with others - No medications needed.  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 Cervical collar as directed  Cont CIR   -working toward dc 8/20  2. DVT Prophylaxis/Anticoagulation/bilateral pulmonary emboli/bilateral posterior tibial peroneal vein ZOX:WRUEAVWVT:Eliquis 5 mg twice a day , will need to monitor closely for bleeding  Tolerating oral anticoagulation at present  3. Pain Management:Baclofen 10 mg twice daily, Flexeril and oxycodone as needed  - knee xr shows significant medial compartment OA  -MRI demonstrates left posterior horn meniscus tear, OA medial compartment, effusion. All imaging reviewed  -continue scheduled Ice  -has improved with knee brace, prednisone---taper prednisone off  -outpt ortho follow up  -knee injection if needed 4. Mood:Provide emotional support 5. Neuropsych: This patientiscapable of making  decisions on hisown behalf. 6. Skin/Wound Care:Appreciate WOC input for moisture associated sacral wound 7. Fluids/Electrolytes/Nutrition:Routine in and outs  BMP within acceptable range on 8/14 8.Neurogenic bowel and bladder. Patient now with loose stool  -Held Colace, Imodium as needed 9.Hypertension./ Cozaar 25 mg daily.    Vitals:   04/28/18 1957 04/29/18 0525  BP: 113/70 131/89  Pulse: 61 63  Resp: 17 18  Temp: 98.3 F (36.8 C) 97.8 F (36.6 C)  SpO2: 97% 100%   10.Morbid obesity. BMI 45.29. Dietary follow-up 11.History of pernicious anemia with ABLA  Hemoglobin 11.3 8/14    12. Thrombocytopenia_ resolved  Platelets 218 8/14    HIT within normal limits on 7/31  Hemoccult negative on 8/1, positive on 8/3, negative again on 8/4 13. Hypoalbuminemia  Supplement initiated on 8/1 14. Sleep disturbance  Possibly associated with PTSD  ECG reviewed, with borderline QT prolongation  Cont restoril 7.5 daily  15. Loose stool:improved no incont x 48hr  Off softeners and supplements  Cont probiotic  Changed protonix to pepcid  Prn imodium  -?rare SE of decadron---since this was stopped, stools have improved  -also have to consider IBS   16. Low grade temp:  -afebrile x 48+ hours  -all labs and imaging negative for infectious process.   -may be related to DVT      LOS (Days) 18 A FACE TO FACE EVALUATION WAS PERFORMED  Erick Colacendrew E Olliver Boyadjian 04/29/2018 7:54 AM

## 2018-04-30 ENCOUNTER — Inpatient Hospital Stay (HOSPITAL_COMMUNITY): Admitting: Physical Therapy

## 2018-04-30 MED ORDER — PREDNISONE 20 MG PO TABS
20.0000 mg | ORAL_TABLET | Freq: Two times a day (BID) | ORAL | Status: DC
  Administered 2018-04-30 – 2018-05-02 (×4): 20 mg via ORAL
  Filled 2018-04-30 (×4): qty 1

## 2018-04-30 MED ORDER — HYDROCHLOROTHIAZIDE 12.5 MG PO CAPS
12.5000 mg | ORAL_CAPSULE | Freq: Every day | ORAL | Status: DC
  Administered 2018-05-01: 12.5 mg via ORAL
  Filled 2018-04-30 (×2): qty 1

## 2018-04-30 NOTE — Progress Notes (Signed)
Physical Therapy Session Note  Patient Details  Name: [name] [name] MRN: [id number] Date of Birth: April 03, 1966  Today's Date: 04/30/2018 PT Individual Time: 1300-1345 PT Individual Time Calculation (min): 45 min   Short Term Goals: Week 1:  PT Short Term Goal 1 (Week 1): Pt will perform bed mobility with supervision assist  PT Short Term Goal 1 - Progress (Week 1): Met PT Short Term Goal 2 (Week 1): Pt will ambulate 148f with min assist  PT Short Term Goal 2 - Progress (Week 1): Met PT Short Term Goal 3 (Week 1): Pt will perform sit<>stand with min assist consistently PT Short Term Goal 3 - Progress (Week 1): Met PT Short Term Goal 4 (Week 1): Pt will initiate stair training  PT Short Term Goal 4 - Progress (Week 1): Met  Skilled Therapeutic Interventions/Progress Updates:  Pt was seen bedside in the pm. Pt propelled w/c to and from gym with B UEs and S. Pt ambulated 300 feet with rolling walker and S. Pt performed multiple sit to stand and stand pivot transfers with rolling walker and S. In gym treatment focused on LE strengthening with step taps and alternating step taps 3 sets x 10 reps each. Pt transferred edge of mat to supine and supine to edge of mat with S and increased time. Pt returned to room following treatment and left sitting up in w/c with all needs within reach.   Therapy Documentation Precautions:  Precautions Precautions: Fall Required Braces or Orthoses: Cervical Brace Cervical Brace: Hard collar Restrictions Weight Bearing Restrictions: No Other Position/Activity Restrictions: cervical collar can be donned in sitting, removed for showering, and removed while in bed per orders General:   Pain: No c/o pain.   See Function Navigator for Current Functional Status.   Therapy/Group: Individual Therapy  MDub Amis8/18/2019, 3:28 PM

## 2018-04-30 NOTE — Progress Notes (Signed)
[place] PHYSICAL MEDICINE & REHABILITATION     PROGRESS NOTE  Subjective/Complaints:   Discussed loose stool, 2 incont mushy stools yesterday, statses this all started 3 weeks ago, reviewed med list.  Pt was on lisinopril and HCTZ at home for diarrhea  ROS: No CP, SOB, N/V/D  Objective: Vital Signs: Blood pressure (!) 136/95, pulse (!) 51, temperature 98.9 F (37.2 C), temperature source Oral, resp. rate 18, height 5\' 9"  (1.753 m), weight 118 kg, SpO2 100 %. No results found. No results for input(s): WBC, HGB, HCT, PLT in the last 72 hours. No results for input(s): NA, K, CL, GLUCOSE, BUN, CREATININE, CALCIUM in the last 72 hours.  Invalid input(s): CO CBG (last 3)  No results for input(s): GLUCAP in the last 72 hours.  Wt Readings from Last 3 Encounters:  04/26/18 118 kg  03/23/18 (!) 139.1 kg    Physical Exam:  BP (!) 136/95 (BP Location: Left Arm)   Pulse (!) 51   Temp 98.9 F (37.2 C) (Oral)   Resp 18   Ht 5\' 9"  (1.753 m)   Wt 118 kg   SpO2 100%   BMI 38.42 kg/m  Constitutional: No distress . Vital signs reviewed. HEENT: EOMI, oral membranes moist Neck: supple Cardiovascular: RRR without murmur. No JVD    Respiratory: CTA Bilaterally without wheezes or rales. Normal effort    GI: BS +, non-tender, non-distended  Musculoskeletal: Right knee with ongoing effusion, decreased pain, Bledsoe in place Neurological: He isalertand oriented  Motor:  RUE: 4+/5 prox to distal  LUE: 5/5 proximal to distal  RLE: limited due to pain LLE: 4-/5 hip flexion, 4/5 knee extension, 5/5 ankle dorsiflexion, stable Skin:Incision site remains c/d/i Sacral wound remains dressed Psych: pleasant  Assessment/Plan: 1. Functional deficits secondary to cervical myelopathy with quadriparesis which require 3+ hours per day of interdisciplinary therapy in a comprehensive inpatient rehab setting. Physiatrist is providing close team supervision and 24 hour management of active medical  problems listed below. Physiatrist and rehab team continue to assess barriers to discharge/monitor patient progress toward functional and medical goals.  Function:  Bathing Bathing position   Position: Shower  Bathing parts Body parts bathed by patient: Right arm, Left arm, Chest, Abdomen, Right upper leg, Left upper leg, Right lower leg, Left lower leg, Back, Front perineal area, Buttocks Body parts bathed by helper: Buttocks, Front perineal area  Bathing assist Assist Level: Touching or steadying assistance(Pt > 75%) Assistive Device Comment: lh sponge    Upper Body Dressing/Undressing Upper body dressing   What is the patient wearing?: Pull over shirt/dress     Pull over shirt/dress - Perfomed by patient: Thread/unthread right sleeve, Thread/unthread left sleeve, Put head through opening, Pull shirt over trunk Pull over shirt/dress - Perfomed by helper: Put head through opening, Pull shirt over trunk        Upper body assist Assist Level: Supervision or verbal cues   Set up : To obtain clothing/put away  Lower Body Dressing/Undressing Lower body dressing   What is the patient wearing?: Pants, Non-skid slipper socks     Pants- Performed by patient: Thread/unthread right pants leg, Thread/unthread left pants leg, Pull pants up/down Pants- Performed by helper: Pull pants up/down Non-skid slipper socks- Performed by patient: Don/doff right sock, Don/doff left sock Non-skid slipper socks- Performed by helper: Don/doff right sock, Don/doff left sock               TED Hose - Performed by helper: Don/doff right TED  hose, Don/doff left TED hose  Lower body assist Assist for lower body dressing: Touching or steadying assistance (Pt > 75%)      Toileting Toileting Toileting activity did not occur: Safety/medical concerns Toileting steps completed by patient: Adjust clothing prior to toileting, Performs perineal hygiene, Adjust clothing after toileting Toileting steps  completed by helper: Adjust clothing after toileting, Adjust clothing prior to toileting, Performs perineal hygiene Toileting Assistive Devices: Grab bar or rail  Toileting assist Assist level: Touching or steadying assistance (Pt.75%)   Transfers Chair/bed transfer   Chair/bed transfer method: Ambulatory Chair/bed transfer assist level: Supervision or verbal cues Chair/bed transfer assistive device: Patent attorney     Max distance: 300 Assist level: Supervision or verbal cues   Wheelchair   Type: Manual Max wheelchair distance: 150 Assist Level: Supervision or verbal cues  Cognition Comprehension Comprehension assist level: Follows complex conversation/direction with no assist  Expression Expression assist level: Expresses complex ideas: With no assist  Social Interaction Social Interaction assist level: Interacts appropriately with others - No medications needed.  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 Cervical collar as directed  Cont CIR   -working toward dc 8/20  2. DVT Prophylaxis/Anticoagulation/bilateral pulmonary emboli/bilateral posterior tibial peroneal vein ZOX:WRUEAVW 5 mg twice a day , will need to monitor closely for bleeding  Tolerating oral anticoagulation at present  3. Pain Management:Baclofen 10 mg twice daily, Flexeril and oxycodone as needed  - knee xr shows significant medial compartment OA  -MRI demonstrates left posterior horn meniscus tear, OA medial compartment, effusion. All imaging reviewed  -continue scheduled Ice  -has improved with knee brace, prednisone---taper prednisone off  -outpt ortho  follow up  -knee injection if needed 4. Mood:Provide emotional support 5. Neuropsych: This patientiscapable of making decisions on hisown behalf. 6. Skin/Wound Care:Appreciate WOC input for moisture associated sacral wound 7. Fluids/Electrolytes/Nutrition:Routine in and outs  BMP within acceptable range on 8/14 8.Neurogenic bowel and bladder. Patient now with loose stool  -Held Colace, Imodium as needed 9.Hypertension./ Cozaar 25 mg daily.    Vitals:   04/29/18 1934 04/30/18 0505  BP: 130/76 (!) 136/95  Pulse: (!) 56 (!) 51  Resp: 18 18  Temp: 98.6 F (37 C) 98.9 F (37.2 C)  SpO2: 100% 100%  Good control overall will monitor- may improve with steroid wean 10.Morbid obesity. BMI 45.29. Dietary follow-up 11.History of pernicious anemia with ABLA  Hemoglobin 11.3 8/14    12. Thrombocytopenia_ resolved  Platelets 218 8/14    HIT within normal limits on 7/31  Hemoccult negative on 8/1, positive on 8/3, negative again on 8/4 13. Hypoalbuminemia  Supplement initiated on 8/1 14. Sleep disturbance  Possibly associated with PTSD  ECG reviewed, with borderline QT prolongation  Cont restoril 7.5 daily  15. Loose stool:2 incont mushy stools yesteray , taking immodium, Cozaar may have up to 15% incidence of diarrhea will d/c was on Lisinopril and HCTZ at home, will D/C lasix and start HCTZ  Off softeners and supplements  Cont probiotic  Changed protonix to pepcid  Prn imodium  -?rare SE of decadron---since this was stopped, stools have improved  -also have to consider IBS   16. Low grade temp:improved    -all  labs and imaging negative for infectious process.   -probably related to DVT      LOS (Days) 19 A FACE TO FACE EVALUATION WAS PERFORMED  Erick Colacendrew E Tiras Bianchini 04/30/2018 8:39 AM

## 2018-04-30 NOTE — Plan of Care (Signed)
  Problem: SCI BOWEL ELIMINATION Goal: RH STG MANAGE BOWEL WITH ASSISTANCE Description STG Manage Bowel with min Assistance.  Outcome: Progressing   Problem: SCI BLADDER ELIMINATION Goal: RH STG MANAGE BLADDER WITH ASSISTANCE Description STG Manage Bladder With Min Assistance  Outcome: Progressing   Problem: RH SKIN INTEGRITY Goal: RH STG SKIN FREE OF INFECTION/BREAKDOWN Description Mod I  Outcome: Progressing   Problem: RH SAFETY Goal: RH STG ADHERE TO SAFETY PRECAUTIONS W/ASSISTANCE/DEVICE Description STG Adhere to Safety Precautions With min Assistance/Device.  Outcome: Progressing   Problem: RH PAIN MANAGEMENT Goal: RH STG PAIN MANAGED AT OR BELOW PT'S PAIN GOAL Description <2  Outcome: Progressing   

## 2018-05-01 ENCOUNTER — Inpatient Hospital Stay (HOSPITAL_COMMUNITY)

## 2018-05-01 ENCOUNTER — Inpatient Hospital Stay (HOSPITAL_COMMUNITY): Admitting: Occupational Therapy

## 2018-05-01 DIAGNOSIS — R195 Other fecal abnormalities: Secondary | ICD-10-CM

## 2018-05-01 DIAGNOSIS — M10072 Idiopathic gout, left ankle and foot: Secondary | ICD-10-CM

## 2018-05-01 MED ORDER — COLCHICINE 0.6 MG PO TABS
0.6000 mg | ORAL_TABLET | Freq: Every day | ORAL | Status: DC
  Administered 2018-05-01: 0.6 mg via ORAL
  Filled 2018-05-01 (×2): qty 1

## 2018-05-01 MED ORDER — CALCIUM POLYCARBOPHIL 625 MG PO TABS
1250.0000 mg | ORAL_TABLET | Freq: Every day | ORAL | Status: DC
  Administered 2018-05-01 – 2018-05-02 (×2): 1250 mg via ORAL
  Filled 2018-05-01 (×3): qty 2

## 2018-05-01 MED ORDER — BACLOFEN 10 MG PO TABS: 10.0000 mg | ORAL_TABLET | Freq: Two times a day (BID) | ORAL | Status: DC | PRN

## 2018-05-01 NOTE — Discharge Summary (Signed)
NAMAzucena [name]: [name], [name] MEDICAL RECORD [id number] ACCOUNT [id number] DATE OF BIRTH:[birth year] FACILITY: [facility] LOC[facility]: [facility] PHYSICIAN:ANKIT PATEL, MD  DISCHARGE SUMMARY  DATE OF DISCHARGE:  05/02/2018  DATE OF ADMISSION:  04/11/2018  DATE OF DISCHARGE:  05/02/2018  DISCHARGE DIAGNOSES: 1.  Cervical myelopathy status post C3-C5 ACDF 03/24/2018. 2.  Bilateral pulmonary emboli with bilateral posterior tibial, peroneal vein deep vein thromboses. 3.  Pain management.   4.  Neurogenic bowel and bladder. 5.  Hypertension. 6.  Morbid obesity. 7.  History of pernicious anemia. 8.  Thrombocytopenia. 9.  Left posterior horn meniscus tear.  HOSPITAL COURSE:  This is a 52 year old right-handed male with history of hypertension, pernicious anemia, morbid obesity, gout.  Lives alone.  Plans to stay with his parents on discharge.  presented on 03/22/2018 with decrease in balance, numbness in  his hands, primarily on the right, dragging of his leg.  Recent fall x2.  Also states increasing constipation as well as urinary retention.  Cranial CT scan negative.  MRI thoracic, lumbar spine showed severe cord compression extending from C3-C4 down to  C4-C5.  Underwent anterior cervical corpectomy of C4, microdissection foraminotomies of C4 and C5 nerve roots 03/24/2018 per Dr. Wynetta Emeryram.  Rapid response 03/25/2018 after unresponsive transferring from chair to bed initially hypoxic nonrebreather mask.   Received normal saline.  The patient again on 03/27/2018 with increasing shortness of breath.  CT angiogram of the chest showed bilateral acute pulmonary emboli occlusive with multiple segmental and subsegmental branches.  Venous Doppler studies positive  for acute deep venous thrombosis involving bilateral posterior tibial and peroneal veins.  Venous Doppler studies reported.  Initially placed on heparin and transitioned to Coumadin.  Cardiology service is consulted for bradycardia.  EKG normal sinus.    Echocardiogram, ejection fraction 55%, grade II diastolic dysfunction.  Underwent placement of IVC filter per interventional radiology.  03/28/2018 as well as reexploration of anterior cervical wound following initial heparin anticoagulation for active  oozing from the site.  The patient's heparin and Coumadin discontinued due to sensitivity of anticoagulation.  He did receive vitamin K.  Initial attempts to transition to Lovenox; however, again oozing from site.  Lovenox held.  Oncology/hematology  service is consulted on 07/26.  Lovenox conservatively again initiated 40 mg daily.  Transitioned to 60 mg every 12 hours and later to Eliquis with no further bleeding episodes.  The patient was admitted for comprehensive rehabilitation program.  PAST MEDICAL HISTORY:  See discharge diagnoses.  SOCIAL HISTORY:  Lives alone.  Plans to stay with his parents on discharge.  FUNCTIONAL STATUS:  Upon admission to rehab services he was minimal assist +2 physical assist ,minimal assist stand pivot transfers, ambulating short distances.  PHYSICAL EXAMINATION: VITAL SIGNS:  Blood pressure 128/78, pulse 56, temperature 97, respirations 18. GENERAL:  Alert male in no acute distress. HEENT:  EOMs intact. NECK:  Cervical collar in place. CARDIOVASCULAR:  Rate controlled. ABDOMEN:  Soft, nontender, good bowel sounds. LUNGS:  Clear to auscultation without wheeze.  REHABILITATION HOSPITAL COURSE:  The patient was admitted to inpatient rehabilitation services with therapies initiated on a 3-hour daily basis, consisting of physical therapy, occupational therapy and rehabilitation nursing.  The following issues were  addressed during patient's rehabilitation stay.  Pertaining to the patient's ACDF C3-C5, follow up with neurosurgery.  Cervical collar as directed.  Completed Decadron taper.  He was transitioned from Lovenox to Eliquis for pulmonary emboli, bilateral  posterior tibial, peroneal vein DVTs.  Close  monitoring of hemoglobin and hematocrit.  He did have a history of pernicious anemia.  He would follow up with hematology services.  Neurogenic bowel and bladder.  Bowel program regulated.  He is voiding much  improved.  Blood pressure controlled.  The patient with complaints of right knee pain.  Noted history of gout.  Uric acid level 7.4.  X-ray showed small joint effusion, osteoarthritic changes.  MRI completed of the knee 04/25/2018 showing complex tear of  the posterior horn of the medial meniscus.  Orthopedic service is consulted.  No surgical intervention.  Placed in a Bledsoe brace unlocked.  Weightbearing as tolerated.  He [facility].  Pain  management as directed.  The patient received weekly collaborative interdisciplinary team conferences to discuss estimated length of stay, family teaching, any barriers to discharge.  He can propel his wheelchair supervision.  Ambulates 300 feet with  rolling walker, supervision.  Perform multiple to stand, stand pivot transfers, rolling walker, supervision.  Gathered belongings for activities of daily living and homemaking.  He was discharged to home.  DISCHARGE MEDICATIONS:  Included Eliquis 5 mg p.o. b.i.d., aspirin 81 mg p.o. daily, baclofen 10 mg p.o. b.i.d as needed., vitamin B12 at 5000 mcg as directed, Flexeril 10 mg p.o. t.i.d. as needed, Pepcid 20 mg p.o. b.i.d., HCTZ 12.5 mg p.o. daily, oxycodone 10 mg  q.3 hours as needed for pain, colchicine 0.6 mg daily, prednisone 20 mg p.o. b.i.d. taper as directed, Florastor 250 mg p.o. b.i.d., Restoril 7.5 mg p.o. at bedtime.  Cervical collar as directed.  Bledsoe brace as directed unlocked.  The patient will follow up Dr. Maryla MorrowAnkit Patel at the outpatient rehab service office as directed; Dr. Donalee CitrinGary Cram, call for appointment; Dr. Candise CheKale hematology services; Devra Doppamieka Howell medical  management; Dr. Ruthy DickJamie Wagner, interventional radiology [facility].  TN/NUANCE D:05/01/2018 T:05/01/2018 JOB:[id number]

## 2018-05-01 NOTE — Progress Notes (Signed)
Physical Therapy Discharge Summary  Patient Details  Name: [name] [name] MRN: [id number] Date of Birth: April 16, 1966  Today's Date: 05/01/2018 PT Individual Time: 1300-1420 PT Individual Time Calculation (min): 80 min    Patient has met 8 of 9 long term goals due to improved activity tolerance, improved balance, increased strength, decreased pain, ability to compensate for deficits and functional use of  right upper extremity, right lower extremity, left upper extremity and left lower extremity.  Patient to discharge at an ambulatory level Supervision.   Patient's care partner is independent to provide the necessary physical assistance at discharge.  Reasons goals not met:na  Recommendation:  Patient will benefit from ongoing skilled PT services in home health setting to continue to advance safe functional mobility, address ongoing impairments in strength, flexibility, balance, use of Bledsoe brace, and minimize fall risk.  Equipment: Bledsoe brace, RW  Reasons for discharge: treatment goals met and discharge from hospital  Patient/family agrees with progress made and goals achieved: Yes  PT Discharge Precautions/Restrictions    Pain   Vision/Perception     Cognition   Sensation Sensation Light Touch: Impaired Detail Light Touch Impaired Details: Impaired RLE Additional Comments: decreased appreciation to light touch testing R>L, reports N?T in BLE Coordination Gross Motor Movements are Fluid and Coordinated: No Fine Motor Movements are Fluid and Coordinated: No Heel Shin Test: NT Motor     Mobility   Locomotion     Trunk/Postural Assessment  Cervical Assessment Cervical Assessment: Exceptions to WFL(cervical collar in place) Thoracic Assessment Thoracic Assessment: Exceptions to WFL(rounded shoulders) Lumbar Assessment Lumbar Assessment: Exceptions to WFL(slight posterior pelvic tilt)  Balance Balance Balance Assessed: Yes Static Sitting Balance Static  Sitting - Balance Support: No upper extremity supported Static Sitting - Level of Assistance: 5: Stand by assistance Dynamic Sitting Balance Dynamic Sitting - Balance Support: During functional activity Dynamic Sitting - Level of Assistance: 6: Modified independent (Device/Increase time)(LOB posteriorly X1 requiring use of his UEs to regain his balance.  ) Static Standing Balance Static Standing - Balance Support: During functional activity Static Standing - Level of Assistance: 5: Stand by assistance Dynamic Standing Balance Dynamic Standing - Balance Support: During functional activity Dynamic Standing - Level of Assistance: 5: Stand by assistance Extremity Assessment      RLE Assessment RLE Assessment: Exceptions to [facility] General Strength Comments: groslly in sitting 4/5 hip; knee NT due to meniscus; 4+/5 ankle SF/PF LLE Assessment LLE Assessment: Exceptions to [facility] General Strength Comments: grossly in sitting 4/5 hip to 4+/5 knee and ankle   See Function Navigator for Current Functional Status.  COOK,CAROLINE 05/01/2018, 5:18 PM   Deniece Ree PT, DPT, CBIS  Supplemental Physical Therapist [facility]   Pager [phone]

## 2018-05-01 NOTE — Discharge Summary (Signed)
Discharge summary job [phone]

## 2018-05-01 NOTE — Progress Notes (Signed)
Occupational Therapy Session Note  Patient Details  Name: [name] [name] MRN: [id number] Date of Birth: [birth year]  Today's Date: 05/01/2018 OT Individual Time: [id number] OT Individual Time Calculation (min): 75 min    Short Term Goals: Week 3:  OT Short Term Goal 1 (Week 3): Continue working on established LTGs set at supervision to min assist level  Skilled Therapeutic Interventions/Progress Updates:    Pt completed shower and dressing during session.  He was able to complete all transfers with use of the RW with close supervision.  He was also able to doff his right hinged knee brace with supervision, but needed min assist to put it back on.  He completed all bathing sit to stand with close supervision as well with dressing sit to stand from the wheelchair with supervision as well.  Took pt down to the ADL apartment for practice with walk-in shower transfers.  Feel he will benefit from 3:1 in the shower vs tub seat as the arms on the 3:1 will give him something to push up from.  He agrees with this and provided visual reference of current shower setup.  He completed the transfer with min guard assist as well.  Returned to room at end of session with call button and phone in reach and pt remaining in the wheelchair.    Therapy Documentation Precautions:  Precautions Precautions: Fall Required Braces or Orthoses: Cervical Brace Cervical Brace: Hard collar Restrictions Weight Bearing Restrictions: No Other Position/Activity Restrictions: cervical collar can be donned in sitting, removed for showering, and removed while in bed per orders   Pain: Pain Assessment Pain Scale: Faces Faces Pain Scale: Hurts a little bit Pain Type: Acute pain Pain Location: Foot Pain Orientation: Left Pain Descriptors / Indicators: Discomfort Pain Onset: On-going Pain Intervention(s): Repositioned ADL: See Function Navigator for Current Functional Status.   Therapy/Group: Individual  Therapy  Lesia Monica OTR/L 05/01/2018, 12:37 PM

## 2018-05-01 NOTE — Progress Notes (Addendum)
Physical Therapy Session Note  Patient Details  Name: [name] [name] MRN: [id number] Date of Birth: Dec 04, 1965  Today's Date: 05/01/2018 PT Individual Time: 1300-1420 PT Individual Time Calculation (min): 80 min   Short Term Goals:  Week 2:  PT Short Term Goal 1 (Week 2): STG=LTg due to ELOS Week 3:  PT Short Term Goal 1 (Week 3): STG =LTG due to setimated D/C date   Skilled Therapeutic Interventions/Progress Updates:   Simulated car transfer with supervision.  PT educated pt in importance of good alignment of Bledsoe brace with his R knee; brace had slipped distally.  Pt adjusted it with cues.  NuStep at level 8 x 6 minutes using bil UEs and bil LEs, rated 11 on Borg scale, using bil LEs focusing on neutral hip, at level 6 x 2 minutes, rated 12 on Borg. W/c propulsion on level terrain x 50' x 4 throughout session, for endurance.  Gait training on level tile using RW, stepping sideways L<>R x 10 each.    Seated 15 x 2 each bil ankle PF/DF.  Simulated stepping over shower entrance (per parents' home set-up) with RW, supervision.   Pt left sitting on toilet, voicing agreement that he would use call bell to notify staff when finished.  Beulah, RN notified that pt was on the toilet.    Therapy Documentation Precautions:  Precautions Precautions: Fall Required Braces or Orthoses: Cervical Brace Cervical Brace: Hard collar Restrictions Weight Bearing Restrictions: No Other Position/Activity Restrictions: cervical collar can be donned in sitting, removed for showering, and removed while in bed per orders   Pain: none per pt       See Function Navigator for Current Functional Status.   Therapy/Group: Individual Therapy  Antwoin Lackey 05/01/2018, 2:41 PM

## 2018-05-01 NOTE — Progress Notes (Signed)
Physical Therapy Session Note  Patient Details  Name: [name] [name] MRN: [id number] Date of Birth: Apr 02, 1966  Today's Date: 05/01/2018 PT Individual Time: 4098-11911030-1115 PT Individual Time Calculation (min): 45 min   Short Term Goals: Week 2:  PT Short Term Goal 1 (Week 2): STG=LTg due to ELOS  Skilled Therapeutic Interventions/Progress Updates:    Pt seated in w/c upon PT arrival, agreeable to therapy tx and denies pain. Pt transported to the gym. Pt ambulated to step x 50 ft with RW and supervision, ascended/descended 4 steps with R handrail and B UE support on rail, side step pattern. Pt ambulated back to w/c with RW and supervision. Pt ambulated x 200 ft with RW and supervision working on endurance and activity tolerance. Pt transferred on/off mat and sit<>supine Mod I for the therapist to help adjust knee brace alignment. Pt performed LE strengthening exercises in parallel bars including 2 x 10 each: mini squats, hamstring curls, hip abduction with therapist providing cues for techniques. Pt ambulated back to room with RW and supervision x 150 ft. Pt transferred to supine Mod I and left supine with needs in reach.   Therapy Documentation Precautions:  Precautions Precautions: Fall Required Braces or Orthoses: Cervical Brace Cervical Brace: Hard collar Restrictions Weight Bearing Restrictions: No Other Position/Activity Restrictions: cervical collar can be donned in sitting, removed for showering, and removed while in bed per orders   See Function Navigator for Current Functional Status.   Therapy/Group: Individual Therapy  Cresenciano GenreEmily van Schagen, PT, DPT 05/01/2018, 11:12 AM

## 2018-05-01 NOTE — Progress Notes (Signed)
[facility]ICAL [facility]ICINE & REHABILITATION     PROGRESS NOTE  Subjective/Complaints:  Patient seen lying in bed this morning. He states he slept well overnight after sitting medications. Discussed with nursing regarding patient being unhappy. Patient very upset this morning for multiple reasons, particularly if loose stools, gout, blood pressure medications.  ROS: +loose stools. denies CP, SOB, N/V/D  Objective: Vital Signs: Blood pressure 129/79, pulse 64, temperature 97.7 F (36.5 C), temperature source Oral, resp. rate 18, height 5\' 9"  (1.753 m), weight 118 kg, SpO2 100 %. No results found. No results for input(s): WBC, HGB, HCT, PLT in the last 72 hours. No results for input(s): NA, K, CL, GLUCOSE, BUN, CREATININE, CALCIUM in the last 72 hours.  Invalid input(s): CO CBG (last 3)  No results for input(s): GLUCAP in the last 72 hours.  Wt Readings from Last 3 Encounters:  04/26/18 118 kg  03/23/18 (!) 139.1 kg    Physical Exam:  BP 129/79 (BP Location: Left Arm)   Pulse 64   Temp 97.7 F (36.5 C) (Oral)   Resp 18   Ht 5\' 9"  (1.753 m)   Wt 118 kg   SpO2 100%   BMI 38.42 kg/m  Constitutional: No distress . Vital signs reviewed. HEENT: EOMI, oral membranes moist Neck: supple Cardiovascular: RRR without murmur. No JVD    Respiratory: CTA Bilaterally without wheezes or rales. Normal effort    GI: BS +, non-tender, non-distended  Musculoskeletal: Bledsoe brace. No edema b/l LE Left lower extremity first digit: TTP Neurological: He isalertand oriented  Motor:  RUE: 4+/5 prox to distal  LUE: 5/5 proximal to distal  RLE: limited due to pain LLE: 4-/5 hip flexion, 4/5 knee extension, 5/5 ankle dorsiflexion, stable Skin:Incision site remains c/d/i Sacral wound remains dressed Psych: pleasant  Assessment/Plan: 1. Functional deficits secondary to cervical myelopathy with quadriparesis which require 3+ hours per day of interdisciplinary therapy in a comprehensive  inpatient rehab setting. Physiatrist is providing close team supervision and 24 hour management of active medical problems listed below. Physiatrist and rehab team continue to assess barriers to discharge/monitor patient progress toward functional and medical goals.  Function:  Bathing Bathing position   Position: Shower  Bathing parts Body parts bathed by patient: Right arm, Left arm, Chest, Abdomen, Right upper leg, Left upper leg, Right lower leg, Left lower leg, Back, Front perineal area, Buttocks Body parts bathed by helper: Buttocks, Front perineal area  Bathing assist Assist Level: Touching or steadying assistance(Pt > 75%) Assistive Device Comment: lh sponge    Upper Body Dressing/Undressing Upper body dressing   What is the patient wearing?: Pull over shirt/dress     Pull over shirt/dress - Perfomed by patient: Thread/unthread right sleeve, Thread/unthread left sleeve, Put head through opening, Pull shirt over trunk Pull over shirt/dress - Perfomed by helper: Put head through opening, Pull shirt over trunk        Upper body assist Assist Level: Supervision or verbal cues   Set up : To obtain clothing/put away  Lower Body Dressing/Undressing Lower body dressing   What is the patient wearing?: Pants, Non-skid slipper socks     Pants- Performed by patient: Thread/unthread right pants leg, Thread/unthread left pants leg, Pull pants up/down Pants- Performed by helper: Pull pants up/down Non-skid slipper socks- Performed by patient: Don/doff right sock, Don/doff left sock Non-skid slipper socks- Performed by helper: Don/doff right sock, Don/doff left sock  TED Hose - Performed by helper: Don/doff right TED hose, Don/doff left TED hose  Lower body assist Assist for lower body dressing: Touching or steadying assistance (Pt > 75%)      Toileting Toileting Toileting activity did not occur: Safety/medical concerns Toileting steps completed by patient:  Adjust clothing prior to toileting, Performs perineal hygiene, Adjust clothing after toileting Toileting steps completed by helper: Adjust clothing after toileting, Adjust clothing prior to toileting, Performs perineal hygiene Toileting Assistive Devices: Grab bar or rail  Toileting assist Assist level: Touching or steadying assistance (Pt.75%)   Transfers Chair/bed transfer   Chair/bed transfer method: Ambulatory Chair/bed transfer assist level: Supervision or verbal cues Chair/bed transfer assistive device: Environmental consultantWalker, Designer, fashion/clothingArmrests     Locomotion Ambulation     Max distance: 300 Assist level: Supervision or verbal cues   Wheelchair   Type: Manual Max wheelchair distance: 150 Assist Level: Supervision or verbal cues  Cognition Comprehension Comprehension assist level: Follows complex conversation/direction with no assist  Expression Expression assist level: Expresses complex ideas: With no assist  Social Interaction Social Interaction assist level: Interacts appropriately with others - No medications needed.  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 Cervical collar as directed  Cont CIR 2. DVT Prophylaxis/Anticoagulation/bilateral pulmonary emboli/bilateral posterior tibial peroneal vein ZOX:WRUEAVWVT:Eliquis 5 mg twice a day , will need to monitor closely for bleeding  Tolerating oral anticoagulation at present  3. Pain Management:Baclofen 10 mg twice daily changed to PRN on 8/19  oxycodone as needed  - knee xr shows significant medial compartment OA  -MRI reviewed, suggesting left medial meniscal tear and effusion   -continue scheduled Ice  -has improved with  knee brace, prednisone---taper prednisone off  -outpt ortho follow up  -knee injection if needed 4. Mood:Provide emotional support 5. Neuropsych: This patientiscapable of making decisions on hisown behalf. 6. Skin/Wound Care:Appreciate WOC input for moisture associated sacral wound 7. Fluids/Electrolytes/Nutrition:Routine in and outs  BMP within acceptable range on 8/14 8.Neurogenic bowel and bladder.   -Held Colace, Imodium as needed  Fiber started on 8/29 9.Hypertension./ Cozaar 25 mg daily.    Vitals:   04/30/18 1926 05/01/18 0448  BP: (!) 145/78 129/79  Pulse: 79 64  Resp: 18 18  Temp: 98.3 F (36.8 C) 97.7 F (36.5 C)  SpO2: 100% 100%   Relatively controlled on 8/19 10.Morbid obesity. BMI 45.29. Dietary follow-up 11.History of pernicious anemia with ABLA  Hemoglobin 11.3 on 8/14    12. Thrombocytopenia: resolved  Platelets 218 8/14    HIT within normal limits on 7/31  Hemoccult negative on 8/1, positive on 8/3, negative again on 8/4 13. Hypoalbuminemia  Supplement initiated on 8/1 14. Sleep disturbance  Possibly associated with PTSD  ECG reviewed, with borderline QT prolongation  Cont restoril 7.5 daily  15. Loose stool:  Cont taking immodium, d/ced Cozaar may have up to 15% incidence of diarrhea   Lisinopril and HCTZ at home, D/Ced lasix and started HCTZ  Cont probiotic  Changed protonix to pepcid, d/ced on 819  Prn imodium  Also have to consider IBS   16. Low grade temp: Resolved  all labs and imaging negative for infectious process.   Likely related to DVT 17. Gout  Continue prednisone  Colchicine started  on 8/19 per patient preference  >35 minutes spent with patient in counseling regarding loose stools, gout, blood pressure, overall care  LOS (Days) 20 A FACE TO FACE EVALUATION WAS PERFORMED  Ankit Karis Jubanil Patel 05/01/2018 10:40 AM

## 2018-05-02 ENCOUNTER — Ambulatory Visit (HOSPITAL_COMMUNITY): Admitting: Physical Therapy

## 2018-05-02 ENCOUNTER — Inpatient Hospital Stay (HOSPITAL_COMMUNITY): Admitting: Occupational Therapy

## 2018-05-02 MED ORDER — CALCIUM POLYCARBOPHIL 625 MG PO TABS: 1250.0000 mg | ORAL_TABLET | Freq: Every day | ORAL | 0 refills | Status: DC

## 2018-05-02 MED ORDER — ASPIRIN 81 MG PO TBEC: 81.0000 mg | DELAYED_RELEASE_TABLET | Freq: Every day | ORAL | Status: DC

## 2018-05-02 MED ORDER — COLCHICINE 0.6 MG PO TABS: 0.6000 mg | ORAL_TABLET | Freq: Every day | ORAL | 0 refills | Status: AC

## 2018-05-02 MED ORDER — HYDROCHLOROTHIAZIDE 12.5 MG PO CAPS: 12.5000 mg | ORAL_CAPSULE | Freq: Every day | ORAL | 1 refills | Status: DC

## 2018-05-02 MED ORDER — PREDNISONE 20 MG PO TABS: 20.0000 mg | ORAL_TABLET | Freq: Two times a day (BID) | ORAL | 0 refills | Status: DC

## 2018-05-02 MED ORDER — SACCHAROMYCES BOULARDII 250 MG PO CAPS: 250.0000 mg | ORAL_CAPSULE | Freq: Two times a day (BID) | ORAL | 0 refills | Status: DC

## 2018-05-02 MED ORDER — CYANOCOBALAMIN 5000 MCG/ML SL LIQD
5000.0000 ug | Freq: Every day | SUBLINGUAL | 0 refills | Status: DC
Start: 2018-05-02 — End: 2018-09-11

## 2018-05-02 MED ORDER — ACETAMINOPHEN 325 MG PO TABS: 650.0000 mg | ORAL_TABLET | ORAL | Status: AC | PRN

## 2018-05-02 MED ORDER — BACLOFEN 10 MG PO TABS: 10.0000 mg | ORAL_TABLET | Freq: Two times a day (BID) | ORAL | 0 refills | Status: AC | PRN

## 2018-05-02 MED ORDER — OXYCODONE HCL 10 MG PO TABS: 10.0000 mg | ORAL_TABLET | ORAL | 0 refills | Status: DC | PRN

## 2018-05-02 MED ORDER — APIXABAN 5 MG PO TABS: 5.0000 mg | ORAL_TABLET | Freq: Two times a day (BID) | ORAL | 1 refills | Status: AC

## 2018-05-02 NOTE — Progress Notes (Addendum)
Occupational Therapy Discharge Summary  Patient Details  Name: [name] [name] MRN: [id number] Date of Birth: 1966-05-14  Today's Date: 05/02/2018 OT Individual Time: 0901-1000 OT Individual Time Calculation (min): 59 min   Session Note:  Pt transitioned to sitting to start session with supervision.  He then transferred to the elevated toilet with supervision for successful BM.  He completed toilet hygiene and clothing management with supervision as well.  Next, he ambulated over to the walk-in shower with supervision where he stepped backwards over the simulated shower edge of approximately six inches with supervision.  He then completed all bathing sit to stand with supervision.  He was able to remove and donn his cervical collar with setup as well as his hinged knee brace.  When transferring out of the shower he used the RW and ambulated over to the bed where therapist helped re-position the brace higher as it had slid down his leg.  Next had pt complete dressing with setup and use of a wide sockaide.  His family came in during dressing for education.  Discussed pt's technique for use of the 3:1 in the shower as well as transfer technique to get into the shower.  Also educated him on his current level of supervision for toileting, bathing, and dressing, with the exception of occasional assist to adjust the hinged knee brace.  Pt was given two UE exercise handouts for FM coordination and strengthening as well.  Finished session with PT in for next treatment and pt sitting on the side of the bed.   Patient has met 11 of 12 long term goals due to improved balance, ability to compensate for deficits, functional use of  RIGHT upper and RIGHT lower extremity and improved coordination.  Patient to discharge at overall Supervision level.  Patient's care partner is independent to provide the necessary physical assistance at discharge.    Reasons goals not met: Pt did not attempt simple meal prep with use of the  RW.  Recommendation:  Patient will benefit from ongoing skilled OT services in home health setting to continue to advance functional skills in the area of BADL, iADL, Vocation and Reduce care partner burden. Feel pt will benefit from continued HHOT to further progress ADL level and overall strength and coordination to a modified independent level.  Pt still needs supervision for functional transfers and selfcare at this time for safety.  Recommend use of the RW for transfers as well as 3:1 in the shower.    Equipment: RW and 3:1  Reasons for discharge: treatment goals met and discharge from hospital  Patient/family agrees with progress made and goals achieved: Yes  OT Discharge Precautions/Restrictions  Precautions Precautions: Fall Required Braces or Orthoses: Cervical Brace;Other Brace/Splint Cervical Brace: Hard collar Other Brace/Splint: hinged knee brace for the RLE to be worn at all times except bathing   Pain  No report of pain during session. Vision Baseline Vision/History: No visual deficits Patient Visual Report: No change from baseline Vision Assessment?: No apparent visual deficits Perception  Perception: Within Functional Limits Praxis Praxis: Intact Cognition Overall Cognitive Status: Within Functional Limits for tasks assessed Arousal/Alertness: Awake/alert Orientation Level: Oriented X4 Attention: Sustained Sustained Attention: Appears intact Memory: Appears intact Awareness: Appears intact Problem Solving: Appears intact Safety/Judgment: Appears intact Sensation Sensation Light Touch Impaired Details: Impaired RUE Additional Comments: Decreased light touch acuity in the right hand compared to the left.  Coordination Gross Motor Movements are Fluid and Coordinated: No Fine Motor Movements are Fluid and Coordinated: No  Coordination and Movement Description: Pt still with decreased gross and FM coordination mostly in the RUE.  He is able to use the UE at a  diminshed level with selfcare tasks at this time.  Slower speed with manipulation of small items such as nine hole peg test compared to the LUE.   Motor  Motor Motor: Tetraplegia Motor - Discharge Observations: Pt with generalized weakness in the RUE and RLE compared to the LUE and LLE.   Mobility  Bed Mobility Bed Mobility: Rolling Right;Rolling Left;Supine to Sit;Sit to Supine Rolling Right: Supervision/verbal cueing Rolling Left: Supervision/Verbal cueing Supine to Sit: Supervision/Verbal cueing Sit to Supine: Supervision/Verbal cueing Transfers Sit to Stand: Supervision/Verbal cueing Stand to Sit: Supervision/Verbal cueing  Trunk/Postural Assessment  Cervical Assessment Cervical Assessment: Exceptions to WFL(cervical collar in place) Thoracic Assessment Thoracic Assessment: Exceptions to WFL(rounded shoulders) Lumbar Assessment Lumbar Assessment: Exceptions to WFL(posterior pelvic tilt at rest)  Balance Balance Balance Assessed: Yes Static Sitting Balance Static Sitting - Balance Support: No upper extremity supported Static Sitting - Level of Assistance: 7: Independent Dynamic Sitting Balance Dynamic Sitting - Balance Support: During functional activity Dynamic Sitting - Level of Assistance: 6: Modified independent (Device/Increase time) Static Standing Balance Static Standing - Balance Support: During functional activity Static Standing - Level of Assistance: 5: Stand by assistance Dynamic Standing Balance Dynamic Standing - Balance Support: During functional activity Dynamic Standing - Level of Assistance: 5: Stand by assistance Extremity/Trunk Assessment RUE Assessment RUE Assessment: Exceptions to [facility] General Strength Comments: Shoulder flexion AROM 0- 100 degrees with difficulty maintaining elbow extension during movement.  Elbow flexion/extension 4/5, grip 3+/5 with pt demonstrating ability to oppose the thumb to all digits.  Decreased FM coordination and speed noted  however compared to the left. LUE Assessment LUE Assessment: Exceptions to [facility] General Strength Comments: AROM WFLS for all joint, strength at elbow and digits 4/5   See Function Navigator for Current Functional Status.  Tekisha Darcey OTR/L 05/02/2018, 4:53 PM

## 2018-05-02 NOTE — Plan of Care (Signed)
  Problem: SCI BOWEL ELIMINATION Goal: RH STG MANAGE BOWEL WITH ASSISTANCE Description STG Manage Bowel with min Assistance.  05/02/2018 1122 by Erie Noe, LPN Outcome: Completed/Met 05/02/2018 0935 by Erie Noe, LPN Outcome: Adequate for Discharge   Problem: SCI BLADDER ELIMINATION Goal: RH STG MANAGE BLADDER WITH ASSISTANCE Description STG Manage Bladder With Min Assistance  05/02/2018 1122 by Erie Noe, LPN Outcome: Completed/Met 05/02/2018 0935 by Erie Noe, LPN Outcome: Adequate for Discharge   Problem: RH SKIN INTEGRITY Goal: RH STG SKIN FREE OF INFECTION/BREAKDOWN Description Mod I  05/02/2018 1122 by Erie Noe, LPN Outcome: Completed/Met 05/02/2018 0935 by Erie Noe, LPN Outcome: Adequate for Discharge   Problem: RH SAFETY Goal: RH STG ADHERE TO SAFETY PRECAUTIONS W/ASSISTANCE/DEVICE Description STG Adhere to Safety Precautions With min Assistance/Device.  05/02/2018 1122 by Erie Noe, LPN Outcome: Completed/Met 05/02/2018 0935 by Erie Noe, LPN Outcome: Adequate for Discharge   Problem: RH PAIN MANAGEMENT Goal: RH STG PAIN MANAGED AT OR BELOW PT'S PAIN GOAL Description <2  05/02/2018 1122 by Erie Noe, LPN Outcome: Completed/Met 05/02/2018 0935 by Erie Noe, LPN Outcome: Adequate for Discharge

## 2018-05-02 NOTE — Progress Notes (Signed)
Physical Therapy Session Note  Patient Details  Name: [name] [name] MRN: [id number] Date of Birth: [birth year]  Today's Date: 05/02/2018 PT Individual Time: 1000-1058 PT Individual Time Calculation (min): 58 min   Short Term Goals: Week 1:  PT Short Term Goal 1 (Week 1): Pt will perform bed mobility with supervision assist  PT Short Term Goal 1 - Progress (Week 1): Met PT Short Term Goal 2 (Week 1): Pt will ambulate 131f with min assist  PT Short Term Goal 2 - Progress (Week 1): Met PT Short Term Goal 3 (Week 1): Pt will perform sit<>stand with min assist consistently PT Short Term Goal 3 - Progress (Week 1): Met PT Short Term Goal 4 (Week 1): Pt will initiate stair training  PT Short Term Goal 4 - Progress (Week 1): Met  Skilled Therapeutic Interventions/Progress Updates:    Patient received sitting up in bed after OT session, pleasant and excited about going home- "we can do whatever you need/want to do because I'm cleared to go home after your session!". Discussed return home and further recommended therapies with family, then adjusted Bledsoe brace as it had slid out of position and continued with gait training throughout unit with patient able to easily ambulate distances >203fwith RW. Performed functional exercises for B LE strengthening as well as Nustep x10 minutes level 8 UE/LEs and continued practicing functional activities such as ramp navigation and gait over unsteady surfaces as well. Fin[facility], and adjusted Bledsoe brace again. He was left sitting up at EOB with family present and all other needs met this afternoon.   Therapy Documentation Precautions:  Precautions Precautions: Fall Required Braces or Orthoses: Cervical Brace Cervical Brace: Hard collar Restrictions Weight Bearing Restrictions: No Other Position/Activity Restrictions: cervical collar can be donned in sitting, removed for showering, and  removed while in bed per orders General: PT Amount of Missed Time (min): 2 Minutes Vital Signs:   Pain: 0/10     See Function Navigator for Current Functional Status.   Therapy/Group: Individual Therapy  KrDeniece ReeT, DPT, CBIS  Supplemental Physical Therapist [facility] Pager [phone] 05/02/2018, 12:24 PM

## 2018-05-02 NOTE — Progress Notes (Signed)
Social Work  Discharge Note  The overall goal for the admission was met for:   Discharge location: Yes  Length of Stay: Yes - 21 days  Discharge activity level: Yes - supervision to modified independent  Home/community participation: Yes  Services provided included: MD, RD, PT, OT, RN, TR, Pharmacy, Neuropsych and SW  Financial Services: Other: Programmer, systems  Follow-up services arranged: Home Health: PT, OT via [facility], DME: wide rolling walker and 3n1 commode via [place] and Patient/Family has no preference for HH/DME agencies  Comments (or additional information):  Patient/Family verbalized understanding of follow-up arrangements: Yes  Individual responsible for coordination of the follow-up plan: pt  Confirmed correct DME delivered: Jihaad Bruschi 05/02/2018    Vonette Grosso

## 2018-05-02 NOTE — Plan of Care (Signed)
  Problem: SCI BOWEL ELIMINATION Goal: RH STG MANAGE BOWEL WITH ASSISTANCE Description STG Manage Bowel with min Assistance.  Outcome: Adequate for Discharge   Problem: SCI BLADDER ELIMINATION Goal: RH STG MANAGE BLADDER WITH ASSISTANCE Description STG Manage Bladder With Min Assistance  Outcome: Adequate for Discharge   Problem: RH SKIN INTEGRITY Goal: RH STG SKIN FREE OF INFECTION/BREAKDOWN Description Mod I  Outcome: Adequate for Discharge   Problem: RH SAFETY Goal: RH STG ADHERE TO SAFETY PRECAUTIONS W/ASSISTANCE/DEVICE Description STG Adhere to Safety Precautions With min Assistance/Device.  Outcome: Adequate for Discharge   Problem: RH PAIN MANAGEMENT Goal: RH STG PAIN MANAGED AT OR BELOW PT'S PAIN GOAL Description <2  Outcome: Adequate for Discharge

## 2018-05-02 NOTE — Discharge Instructions (Signed)
Inpatient Rehab Discharge Instructions  [name] [name] Discharge date and time: No discharge date for patient encounter.   Activities/Precautions/ Functional Status: Activity: Cervical collar as directed.  Bledsoe brace right lower extremity unlocked weightbearing as tolerated Diet: regular diet Wound Care: keep wound clean and dry Functional status:  ___ No restrictions     ___ Walk up steps independently ___ 24/7 supervision/assistance   ___ Walk up steps with assistance ___ Intermittent supervision/assistance  ___ Bathe/dress independently ___ Walk with walker     _x__ Bathe/dress with assistance ___ Walk Independently    ___ Shower independently ___ Walk with assistance    ___ Shower with assistance ___ No alcohol     ___ Return to work/school ________    COMMUNITY REFERRALS UPON DISCHARGE:    Home Health:   PT     OT                       [facility] Phone:  [phone]   Medical Equipment/Items Ordered:   walker, commode                                                       Agency/Supplier:  [facility] @ [phone]     Special Instructions:    My questions have been answered and I understand these instructions. I will adhere to these goals and the provided educational materials after my discharge from the hospital.  Patient/Caregiver Signature _______________________________ Date __________  Clinician Signature _______________________________________ Date __________  Please bring this form and your medication list with you to all your follow-up doctor's appointments.   Information on my medicine - ELIQUIS (apixaban)  This medication education was reviewed with me or my healthcare representative as part of my discharge preparation.  The pharmacist that spoke with me during my hospital stay was:  Daylene PoseyJonathan  Oriet, [facility]PH  Why was Eliquis prescribed for you? Eliquis was prescribed for you to reduce the risk of a blood clot forming that  can cause a stroke if you have a medical condition called atrial fibrillation (a type of irregular heartbeat).  What do You need to know about Eliquis ? Take your Eliquis TWICE DAILY - one tablet in the morning and one tablet in the evening with or without food. If you have difficulty swallowing the tablet whole please discuss with your pharmacist how to take the medication safely.  Take Eliquis exactly as prescribed by your doctor and DO NOT stop taking Eliquis without talking to the doctor who prescribed the medication.  Stopping may increase your risk of developing a stroke.  Refill your prescription before you run out.  After discharge, you should have regular check-up appointments with your healthcare provider that is prescribing your Eliquis.  In the future your dose may need to be changed if your kidney function or weight changes by a significant amount or as you get older.  What do you do if you miss a dose? If you miss a dose, take it as soon as you remember on the same day and resume taking twice daily.  Do not take more than one dose of ELIQUIS at the same time to make up a missed dose.  Important Safety Information A possible side effect of Eliquis is bleeding. You should call your healthcare provider  right away if you experience any of the following: ? Bleeding from an injury or your nose that does not stop. ? Unusual colored urine (red or dark brown) or unusual colored stools (red or black). ? Unusual bruising for unknown reasons. ? A serious fall or if you hit your head (even if there is no bleeding).  Some medicines may interact with Eliquis and might increase your risk of bleeding or clotting while on Eliquis. To help avoid this, consult your healthcare provider or pharmacist prior to using any new prescription or non-prescription medications, including herbals, vitamins, non-steroidal anti-inflammatory drugs (NSAIDs) and supplements.  This website has more information  on Eliquis (apixaban): http://www.eliquis.com/eliquis/home

## 2018-05-02 NOTE — Progress Notes (Signed)
Pt had family Ed for Discharge. Jesusita Okaan, PA reviewed DC instructions. Belongings packed up and will be taken with pt. Has equipment needed for home. No questions or concerns noted. Denies pain. Aspen collar and bledsoe brace in place. Family and pt with belongings to main lobby. Ross LudwigKAYLA M Jillane Po, LPN 1/47/82958/20/2019

## 2018-05-04 ENCOUNTER — Telehealth: Payer: Self-pay | Admitting: Registered Nurse

## 2018-05-04 NOTE — Telephone Encounter (Signed)
Transitional Care call Transitional care call completed, Appointment confirmed, address confirmed.   Patient name: [name] FreedCornell [name] DOB: 02-22-1966 1. Are you/is patient experiencing any problems since coming home? No a. Are there any questions regarding any aspect of care? No 2. Are there any questions regarding medications administration/dosing? No a. Are meds being taken as prescribed? Yes b. "Patient should review meds with caller to confirm"  Medication List Reviewed 3. Have there been any falls? No 4. Has Home Health been to the house and/or have they contacted you? Yes, [facility].  a. If not, have you tried to contact them? NA b. Can we help you contact them? NA 5. Are bowels and bladder emptying properly? Yes a. Are there any unexpected incontinence issues? No b. If applicable, is patient following bowel/bladder programs? NA 6. Any fevers, problems with breathing, unexpected pain? No 7. Are there any skin problems or new areas of breakdown? No 8. Has the patient/family member arranged specialty MD follow up (ie cardiology/neurology/renal/surgical/etc.)?  [name] states he will be calling for his follow up appointments today.  a. Can we help arrange? NA 9. Does the patient need any other services or support that we can help arrange? No 10. Are caregivers following through as expected in assisting the patient? Yes 11. Has the patient quit smoking, drinking alcohol, or using drugs as recommended? [name] denies smoking, drinking alcohol or using illicit drugs.   Appointment date/time 05/12/2018,  arrival time 11:20 for 11:40 appointment with Dr. Allena KatzPatel. At [address]

## 2018-05-05 ENCOUNTER — Telehealth: Payer: Self-pay | Admitting: *Deleted

## 2018-05-05 NOTE — Telephone Encounter (Signed)
[facility] calling about a medication question. Mr [name] [name] is on Eliquis but also ASA, and calling to verify that this is correct. [facility]. Angie notified.

## 2018-05-12 ENCOUNTER — Encounter: Admitting: Physical Medicine & Rehabilitation

## 2018-05-18 ENCOUNTER — Other Ambulatory Visit: Payer: Self-pay | Admitting: Interventional Radiology

## 2018-05-18 DIAGNOSIS — Z95828 Presence of other vascular implants and grafts: Secondary | ICD-10-CM

## 2018-05-26 ENCOUNTER — Encounter: Payer: Self-pay | Admitting: Physical Medicine & Rehabilitation

## 2018-05-26 ENCOUNTER — Encounter: Attending: Physical Medicine & Rehabilitation | Admitting: Physical Medicine & Rehabilitation

## 2018-05-26 VITALS — BP 125/80 | HR 95 | Ht 69.0 in | Wt 273.0 lb

## 2018-05-26 DIAGNOSIS — G959 Disease of spinal cord, unspecified: Secondary | ICD-10-CM | POA: Diagnosis not present

## 2018-05-26 DIAGNOSIS — R269 Unspecified abnormalities of gait and mobility: Secondary | ICD-10-CM | POA: Insufficient documentation

## 2018-05-26 DIAGNOSIS — G9589 Other specified diseases of spinal cord: Secondary | ICD-10-CM | POA: Diagnosis not present

## 2018-05-26 DIAGNOSIS — R531 Weakness: Secondary | ICD-10-CM | POA: Diagnosis not present

## 2018-05-26 DIAGNOSIS — M4802 Spinal stenosis, cervical region: Secondary | ICD-10-CM | POA: Insufficient documentation

## 2018-05-26 DIAGNOSIS — I10 Essential (primary) hypertension: Secondary | ICD-10-CM | POA: Insufficient documentation

## 2018-05-26 DIAGNOSIS — K592 Neurogenic bowel, not elsewhere classified: Secondary | ICD-10-CM | POA: Diagnosis not present

## 2018-05-26 DIAGNOSIS — Z7901 Long term (current) use of anticoagulants: Secondary | ICD-10-CM | POA: Insufficient documentation

## 2018-05-26 DIAGNOSIS — M62838 Other muscle spasm: Secondary | ICD-10-CM

## 2018-05-26 DIAGNOSIS — I2699 Other pulmonary embolism without acute cor pulmonale: Secondary | ICD-10-CM | POA: Insufficient documentation

## 2018-05-26 DIAGNOSIS — Z9889 Other specified postprocedural states: Secondary | ICD-10-CM | POA: Insufficient documentation

## 2018-05-26 DIAGNOSIS — I82401 Acute embolism and thrombosis of unspecified deep veins of right lower extremity: Secondary | ICD-10-CM | POA: Diagnosis not present

## 2018-05-26 DIAGNOSIS — Z79899 Other long term (current) drug therapy: Secondary | ICD-10-CM | POA: Diagnosis not present

## 2018-05-26 NOTE — Progress Notes (Addendum)
Subjective:    Patient ID: [name] [name], male    DOB: 10-Oct-1965, 52 y.o.   MRN: [id number]  HPI 52 year old right-handed male with history of hypertension, pernicious anemia, morbid obesity, gout presents for hospital follow up after receiving CIR for cervical myelopathy s/p decompression.  DATE OF DISCHARGE:  05/02/2018 DATE OF ADMISSION:  04/11/2018  Since discharge, pt had a fall when his leg gave out and is now at a SNF. He continues to wear his C-collar and knee brace.  He sees Neurosurg in 2 weeks. He has to call to schedule for hematology. He saw his PCP. He sees VIR next week. He continues to take Eliquis.  He is taking Baclofen ~3/day. Bladder function has improved.  He started having incontinence again. BP is controlled. He notes tightness in RUE, less in the morning.   Pain Inventory Average Pain 5 Pain Right Now 7 My pain is constant, tingling and aching  In the last 24 hours, has pain interfered with the following? General activity 0 Relation with others 0 Enjoyment of life 0 What TIME of day is your pain at its worst? evening Sleep (in general) Fair  Pain is worse with: walking and sitting Pain improves with: rest Relief from Meds: 7  Mobility walk without assistance walk with assistance use a walker ability to climb steps?  yes do you drive?  no use a wheelchair transfers alone  Function employed # of hrs/week 40 I need assistance with the following:  meal prep, household duties and shopping  Neuro/Psych bowel control problems weakness numbness tingling trouble walking spasms loss of taste or smell  Prior Studies Any changes since last visit?  no  Physicians involved in your care Any changes since last visit?  no   No family history on file. Social History   Socioeconomic History  . Marital status: Legally Separated    Spouse name: Not on file  . Number of children: Not on file  . Years of education: Not on file  . Highest  education level: Not on file  Occupational History  . Not on file  Social Needs  . Financial resource strain: Not on file  . Food insecurity:    Worry: Not on file    Inability: Not on file  . Transportation needs:    Medical: Not on file    Non-medical: Not on file  Tobacco Use  . Smoking status: Never Smoker  . Smokeless tobacco: Never Used  Substance and Sexual Activity  . Alcohol use: Never    Frequency: Never  . Drug use: Never  . Sexual activity: Not Currently    Birth control/protection: Abstinence  Lifestyle  . Physical activity:    Days per week: Not on file    Minutes per session: Not on file  . Stress: Not on file  Relationships  . Social connections:    Talks on phone: Not on file    Gets together: Not on file    Attends religious service: Not on file    Active member of club or organization: Not on file    Attends meetings of clubs or organizations: Not on file    Relationship status: Not on file  Other Topics Concern  . Not on file  Social History Narrative  . Not on file   Past Surgical History:  Procedure Laterality Date  . ANTERIOR CERVICAL CORPECTOMY N/A 03/24/2018   Procedure: CERVICAL FOUR ANTERIOR CERVICAL CORPECTOMY;  Surgeon: Donalee Citrinram, Gary, MD;  Location: [facility] OR;  Service: Neurosurgery;  Laterality: N/A;  . ANTERIOR CERVICAL DECOMP/DISCECTOMY FUSION N/A 03/28/2018   Procedure: Reexploration of anterior cervical wound;  Surgeon: Donalee Citrin, MD;  Location: [facility] OR;  Service: Neurosurgery;  Laterality: N/A;  . IR IVC FILTER PLMT / S&I Lenise Arena GUID/MOD SED  03/28/2018  . MENISCUS REPAIR Left   . TENDON REPAIR     tricept tendon   Past Medical History:  Diagnosis Date  . Degenerative disc disease, lumbar   . Gout   . History of pernicious anemia 1980   Patient says he takes OTC iron supplements QD  . Hypertension    BP 125/80   Pulse 95   Ht 5\' 9"  (1.753 m)   Wt 273 lb (123.8 kg)   SpO2 95%   BMI 40.32 kg/m   Opioid Risk Score:   Fall Risk  Score:  `1  Depression screen PHQ 2/9  No flowsheet data found.   Review of Systems  Constitutional: Negative.   HENT: Negative.   Eyes: Negative.   Respiratory: Negative.   Cardiovascular: Negative.   Gastrointestinal: Negative.   Endocrine: Negative.   Genitourinary: Negative.   Musculoskeletal: Positive for arthralgias, gait problem and myalgias.  Skin: Negative.   Allergic/Immunologic: Negative.   Neurological: Positive for weakness and numbness.  Hematological: Negative.   Psychiatric/Behavioral: Negative.   All other systems reviewed and are negative.      Objective:   Physical Exam Constitutional: No distress . Vital signs reviewed. HENT: Normocephalic.  Atraumatic. Eyes: EOMI. No discharge. Cardiovascular: RRR. No JVD. Respiratory: CTA Bilaterally. Normal effort. GI: BS +. Non-distended. Musc: Right knee edema with tenderness. Neurological: He is alert and oriented  Motor:  RUE: 5/5 prox to distal  LUE: 5/5 proximal to distal  RLE: 3+/5 hip flexion, 4/5 knee extension, 5/5 ankle dorsiflexion LLE: 4--4/5 hip flexion, 4/5 knee extension, 5/5 ankle dorsiflexion Skin: Warm and dry.  Intact Psych: pleasant    Assessment & Plan:  52 year old right-handed male with history of hypertension, pernicious anemia, morbid obesity, gout presents for hospital follow up after receiving CIR for cervical myelopathy s/p decompression.  1. Right upper extremity weakness and bilateral lower extremity weakness secondary to cervical stenosis with resultant cervical myelopathy status post C3-C5 ACDF 03/24/2018.    Cont Cervical collar   Cont therapies  Follow up with Neurosurg  2.  Bilateral pulmonary emboli/bilateral posterior tibial peroneal vein DVT  Cont Eliquis  Follow up with VIR  Follow up with Hematology  3. Pain Management: mainly with muscle spasms  Cont meds per SNF  Recommend increase Baclofen to 20 QID  4.  Neurogenic bowel and bladder.    Bladder  improving  Cont meds for bowels  5. Gait abnormality  Cont therapies  Cont wheelchair

## 2018-05-31 ENCOUNTER — Ambulatory Visit
Admission: RE | Admit: 2018-05-31 | Discharge: 2018-05-31 | Disposition: A | Discharge status: Home / Self Care | Source: Ambulatory Visit | Attending: Interventional Radiology | Admitting: Interventional Radiology

## 2018-05-31 DIAGNOSIS — Z95828 Presence of other vascular implants and grafts: Secondary | ICD-10-CM

## 2018-05-31 HISTORY — PX: IR RADIOLOGIST EVAL & MGMT: IMG5224

## 2018-05-31 NOTE — Progress Notes (Signed)
Chief Complaint: Follow-up IVC filter placement placed 03/28/18 by Dr. Loreta AveWagner  Supervising Physician: Gilmer MorWagner, Jaime  History of Present Illness: [name] [name] is a 52 y.o. male with past medical history significant for degenerative disc disease, gout and HTN [facility] on [facility]/10 c/o weakness in right upper and lower extremities thought to be due to his degenerative disc disease as he has had intermittent weakness before today, however the weakness progressively worsened recently. 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 Abnormal cord signal extending from C3 through C6 consistent with cord edema/contusion. Neuro surgical consultation recommended.  Neurosurgery was consulted who started patient on IV Decadron on 7/10 and eventually proceeded with anterior cervical corpectomy and fusion.   Rapid response was called on 7/13 due to patient becoming unresponsive after transferring from chair to bed. Rapid response again was called on 7/14 for same. Cardiology was consulted for syncopal episodes x 2 and venous doppler of BLE was ordered d/t patient complaints of calf pain - ultimately it was determined patient was (+) for DVT in BLE. CTA was ordered to assess for PE and was determined patient had bilateral acute pulmonary emboli without right heart strain. He was started on IV heparin on 7/15 for above.   Patient reported large amounts of bleeding from surgical incision of neck around 530 on 7/16 - he was evaluated by neurosurgery who stopped heparin at that time and consulted IR for removable IVC filter placement. IVC filter was placed on 03/28/18 without complications.  Patient was trialed on several  anticoagulation therapies including heparin, coumadin and lovenox however he experienced bleeding with each and INR was supratherapeutic. He was placed on Eliquis BID prior to d/c to home on 05/02/18, he states he continues with Eliquis and is followed by Dr. Maryla MorrowAnkit Patel. He is unsure how long he is to remain on Eliquis.   He states he was previously participating fully in PT/OT with use of walker, however he experienced an episode of "legs giving out" this weekend which has made him become dependent on wheelchair use and more sedentary than previously. He states he has follow up with Dr. Wynetta Emeryram on 06/08/18 to discuss his neurosurgical issues.   Past Medical History:  Diagnosis Date  . Degenerative disc disease, lumbar   . Gout   . History of pernicious anemia 1980   Patient says he takes OTC iron supplements QD  . Hypertension     Past Surgical History:  Procedure Laterality Date  . ANTERIOR CERVICAL CORPECTOMY N/A 03/24/2018   Procedure: CERVICAL FOUR ANTERIOR CERVICAL CORPECTOMY;  Surgeon: Donalee Citrinram, Gary, MD;  [facility];  Service: Neurosurgery;  Laterality: N/A;  . ANTERIOR CERVICAL DECOMP/DISCECTOMY FUSION N/A 03/28/2018   Procedure: Reexploration of anterior cervical wound;  Surgeon: Donalee Citrinram, Gary, MD;  [facility];  Service: Neurosurgery;  Laterality: N/A;  . IR IVC FILTER PLMT / S&I Lenise Arena/IMG GUID/MOD SED  03/28/2018  . MENISCUS REPAIR Left   . TENDON REPAIR     tricept tendon    Allergies: Nsaids and Sulfa antibiotics  Medications: Prior to Admission medications   Medication Sig Start Date End Date Taking? Authorizing Provider  acetaminophen (TYLENOL) 325 MG tablet Take 2 tablets (650 mg  total) by mouth every 4 (four) hours as needed for mild pain ((score 1 to 3) or temp > 100.5). 05/02/18   Angiulli, Mcarthur Rossetti, PA-C  apixaban (ELIQUIS) 5 MG TABS tablet Take 1 tablet (5 mg total) by mouth 2 (two) times daily. 05/02/18   Angiulli, Mcarthur Rossetti, PA-C  aspirin EC 81 MG EC tablet Take 1 tablet  (81 mg total) by mouth daily. 05/02/18   Angiulli, Mcarthur Rossetti, PA-C  baclofen (LIORESAL) 10 MG tablet Take 1 tablet (10 mg total) by mouth 2 (two) times daily as needed for muscle spasms. 05/02/18   Angiulli, Mcarthur Rossetti, PA-C  colchicine 0.6 MG tablet Take 1 tablet (0.6 mg total) by mouth daily. 05/02/18   Angiulli, Mcarthur Rossetti, PA-C  Cyanocobalamin 5000 MCG/ML LIQD Place 5,000 mcg under the tongue daily. 05/02/18   Angiulli, Mcarthur Rossetti, PA-C  hydrochlorothiazide (MICROZIDE) 12.5 MG capsule Take 1 capsule (12.5 mg total) by mouth daily. 05/02/18   Angiulli, Mcarthur Rossetti, PA-C  oxyCODONE 10 MG TABS Take 1 tablet (10 mg total) by mouth every 3 (three) hours as needed for severe pain ((score 7 to 10)). 05/02/18   Angiulli, Mcarthur Rossetti, PA-C  polycarbophil (FIBERCON) 625 MG tablet Take 2 tablets (1,250 mg total) by mouth daily. 05/02/18   Angiulli, Mcarthur Rossetti, PA-C  predniSONE (DELTASONE) 20 MG tablet Take 1 tablet (20 mg total) by mouth 2 (two) times daily with a meal. 05/02/18   Angiulli, Mcarthur Rossetti, PA-C  saccharomyces boulardii (FLORASTOR) 250 MG capsule Take 1 capsule (250 mg total) by mouth 2 (two) times daily. 05/02/18   Angiulli, Mcarthur Rossetti, PA-C     No family history on file.  Social History   Socioeconomic History  . Marital status: Legally Separated    Spouse name: Not on file  . Number of children: Not on file  . Years of education: Not on file  . Highest education level: Not on file  Occupational History  . Not on file  Social Needs  . Financial resource strain: Not on file  . Food insecurity:    Worry: Not on file    Inability: Not on file  . Transportation needs:    Medical: Not on file    Non-medical: Not on file  Tobacco Use  . Smoking status: Never Smoker  . Smokeless tobacco: Never Used  Substance and Sexual Activity  . Alcohol use: Never    Frequency: Never  . Drug use: Never  . Sexual activity: Not Currently    Birth control/protection: Abstinence  Lifestyle  . Physical activity:    Days  per week: Not on file    Minutes per session: Not on file  . Stress: Not on file  Relationships  . Social connections:    Talks on phone: Not on file    Gets together: Not on file    Attends religious service: Not on file    Active member of club or organization: Not on file    Attends meetings of clubs or organizations: Not on file    Relationship status: Not on file  Other Topics Concern  . Not on file  Social History Narrative  . Not on file   Vital Signs: There were no vitals taken for this visit.  Physical Exam  Constitutional: He is oriented to person, place, and time. No distress.  Wheelchair bound at visit today  HENT:  Head: Normocephalic.  Cervical collar in place  Cardiovascular: Normal rate, regular rhythm and normal heart sounds.  Pulmonary/Chest: Effort normal and breath sounds normal.  Abdominal: Soft. He exhibits no distension. There is no tenderness.  Musculoskeletal:  Full right leg immobilizer in place.  Neurological: He is alert and oriented to person, place, and time.  Skin: Skin is warm and dry. He is not diaphoretic.  Vitals reviewed.   Imaging: No results found.  Labs:  CBC: Recent Labs    04/19/18 0534 04/20/18 0554 04/24/18 1007 04/26/18 0926  WBC 4.4 4.2 5.6 7.1  HGB 10.2* 10.5* 11.5* 11.3*  HCT 32.6* 33.9* 37.4* 35.9*  PLT 141* 140* 187 218    COAGS: Recent Labs    04/03/18 1602  04/08/18 0232 04/09/18 0232 04/10/18 0328 04/11/18 0348  INR 2.08   < > 1.11 1.15 1.07 1.12  APTT 35  --   --   --   --   --    < > = values in this interval not displayed.    BMP: Recent Labs    04/12/18 0550 04/17/18 0735 04/24/18 1007 04/26/18 0926  NA 141 141 143 142  K 4.6 4.2 3.7 4.2  CL 105 107 107 106  CO2 29 28 28 29   GLUCOSE 127* 132* 121* 118*  BUN 26* 22* 20 15  CALCIUM 8.8* 9.0 9.1 9.4  CREATININE 1.15 1.08 1.24 1.11  GFRNONAA >60 >60 >60 >60  GFRAA >60 >60 >60 >60    LIVER FUNCTION TESTS: Recent Labs     04/03/18 1602 04/12/18 0550  BILITOT 1.4* 0.8  AST 37 21  ALT 65* 21  ALKPHOS 43 49  PROT 5.9* 5.2*  ALBUMIN 2.9* 2.6*    TUMOR MARKERS: No results for input(s): AFPTM, CEA, CA199, CHROMGRNA in the last 8760 hours.  Assessment:  Follow-up IVC filter placed 03/28/18 by Dr. Loreta Ave due to bilateral DVTs and patient unable to tolerate numerous anticoagulation therapies. He continues with PT/OT however has had a recent set back, he states he has has follow up with Dr. Wynetta Emery 06/08/18 for neurosurgical issues and he was seen by Dr. Maryla Morrow on 05/26/18 for PCP follow up without any noted changes to anticoagulation regimen. Patient unsure how long he is to remain on Eliquis BID.  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 [place] reviewed with Dr. Loreta Ave who also assessed patient - discussed tentative plans for IVC filter removal, possibly November 2019 which patient is agreeable to. Risks and benefits were discussed and patient stated understanding. IR scheduler to call patient with possible time and date.  Electronically Signed: Villa Herb PA-C 05/31/2018, 2:57 PM   Please refer to Dr. Kenna Gilbert attestation of this note for management and plan.

## 2018-06-01 ENCOUNTER — Encounter: Payer: Self-pay | Admitting: *Deleted

## 2018-06-06 ENCOUNTER — Inpatient Hospital Stay (HOSPITAL_COMMUNITY)
Admission: AD | Admit: 2018-06-06 | Discharge: 2018-09-11 | DRG: 471 | Disposition: A | Discharge status: Rehabilitation Facility | Source: Other Acute Inpatient Hospital | Attending: Family Medicine | Admitting: Family Medicine

## 2018-06-06 ENCOUNTER — Other Ambulatory Visit: Payer: Self-pay

## 2018-06-06 DIAGNOSIS — R58 Hemorrhage, not elsewhere classified: Secondary | ICD-10-CM

## 2018-06-06 DIAGNOSIS — T82524A Displacement of infusion catheter, initial encounter: Secondary | ICD-10-CM | POA: Diagnosis not present

## 2018-06-06 DIAGNOSIS — D689 Coagulation defect, unspecified: Secondary | ICD-10-CM | POA: Diagnosis not present

## 2018-06-06 DIAGNOSIS — Z888 Allergy status to other drugs, medicaments and biological substances status: Secondary | ICD-10-CM

## 2018-06-06 DIAGNOSIS — D550 Anemia due to glucose-6-phosphate dehydrogenase [G6PD] deficiency: Secondary | ICD-10-CM | POA: Diagnosis present

## 2018-06-06 DIAGNOSIS — J189 Pneumonia, unspecified organism: Secondary | ICD-10-CM | POA: Diagnosis not present

## 2018-06-06 DIAGNOSIS — G959 Disease of spinal cord, unspecified: Secondary | ICD-10-CM | POA: Diagnosis present

## 2018-06-06 DIAGNOSIS — E8770 Fluid overload, unspecified: Secondary | ICD-10-CM | POA: Diagnosis not present

## 2018-06-06 DIAGNOSIS — I82B12 Acute embolism and thrombosis of left subclavian vein: Secondary | ICD-10-CM | POA: Diagnosis not present

## 2018-06-06 DIAGNOSIS — Z789 Other specified health status: Secondary | ICD-10-CM

## 2018-06-06 DIAGNOSIS — M4712 Other spondylosis with myelopathy, cervical region: Secondary | ICD-10-CM

## 2018-06-06 DIAGNOSIS — Z981 Arthrodesis status: Secondary | ICD-10-CM

## 2018-06-06 DIAGNOSIS — R6510 Systemic inflammatory response syndrome (SIRS) of non-infectious origin without acute organ dysfunction: Secondary | ICD-10-CM | POA: Clinically undetermined

## 2018-06-06 DIAGNOSIS — R0989 Other specified symptoms and signs involving the circulatory and respiratory systems: Secondary | ICD-10-CM

## 2018-06-06 DIAGNOSIS — K668 Other specified disorders of peritoneum: Secondary | ICD-10-CM | POA: Diagnosis not present

## 2018-06-06 DIAGNOSIS — I495 Sick sinus syndrome: Secondary | ICD-10-CM | POA: Diagnosis present

## 2018-06-06 DIAGNOSIS — R197 Diarrhea, unspecified: Secondary | ICD-10-CM | POA: Diagnosis not present

## 2018-06-06 DIAGNOSIS — D638 Anemia in other chronic diseases classified elsewhere: Secondary | ICD-10-CM | POA: Diagnosis present

## 2018-06-06 DIAGNOSIS — J9811 Atelectasis: Secondary | ICD-10-CM

## 2018-06-06 DIAGNOSIS — Z803 Family history of malignant neoplasm of breast: Secondary | ICD-10-CM

## 2018-06-06 DIAGNOSIS — D696 Thrombocytopenia, unspecified: Secondary | ICD-10-CM | POA: Diagnosis present

## 2018-06-06 DIAGNOSIS — I82433 Acute embolism and thrombosis of popliteal vein, bilateral: Secondary | ICD-10-CM | POA: Diagnosis not present

## 2018-06-06 DIAGNOSIS — S83204D Other tear of unspecified meniscus, current injury, left knee, subsequent encounter: Secondary | ICD-10-CM

## 2018-06-06 DIAGNOSIS — K567 Ileus, unspecified: Secondary | ICD-10-CM

## 2018-06-06 DIAGNOSIS — G9520 Unspecified cord compression: Secondary | ICD-10-CM | POA: Diagnosis present

## 2018-06-06 DIAGNOSIS — I82443 Acute embolism and thrombosis of tibial vein, bilateral: Secondary | ICD-10-CM | POA: Diagnosis not present

## 2018-06-06 DIAGNOSIS — I712 Thoracic aortic aneurysm, without rupture: Secondary | ICD-10-CM | POA: Diagnosis not present

## 2018-06-06 DIAGNOSIS — R52 Pain, unspecified: Secondary | ICD-10-CM

## 2018-06-06 DIAGNOSIS — I82622 Acute embolism and thrombosis of deep veins of left upper extremity: Secondary | ICD-10-CM | POA: Diagnosis not present

## 2018-06-06 DIAGNOSIS — R509 Fever, unspecified: Secondary | ICD-10-CM

## 2018-06-06 DIAGNOSIS — E1165 Type 2 diabetes mellitus with hyperglycemia: Secondary | ICD-10-CM | POA: Diagnosis not present

## 2018-06-06 DIAGNOSIS — L8990 Pressure ulcer of unspecified site, unspecified stage: Secondary | ICD-10-CM | POA: Diagnosis present

## 2018-06-06 DIAGNOSIS — Z7901 Long term (current) use of anticoagulants: Secondary | ICD-10-CM

## 2018-06-06 DIAGNOSIS — M1712 Unilateral primary osteoarthritis, left knee: Secondary | ICD-10-CM | POA: Diagnosis present

## 2018-06-06 DIAGNOSIS — G9519 Other vascular myelopathies: Secondary | ICD-10-CM | POA: Diagnosis not present

## 2018-06-06 DIAGNOSIS — Z95828 Presence of other vascular implants and grafts: Secondary | ICD-10-CM

## 2018-06-06 DIAGNOSIS — Z86711 Personal history of pulmonary embolism: Secondary | ICD-10-CM

## 2018-06-06 DIAGNOSIS — R202 Paresthesia of skin: Secondary | ICD-10-CM

## 2018-06-06 DIAGNOSIS — E8809 Other disorders of plasma-protein metabolism, not elsewhere classified: Secondary | ICD-10-CM

## 2018-06-06 DIAGNOSIS — F4310 Post-traumatic stress disorder, unspecified: Secondary | ICD-10-CM | POA: Diagnosis present

## 2018-06-06 DIAGNOSIS — R200 Anesthesia of skin: Secondary | ICD-10-CM

## 2018-06-06 DIAGNOSIS — I82812 Embolism and thrombosis of superficial veins of left lower extremities: Secondary | ICD-10-CM | POA: Diagnosis not present

## 2018-06-06 DIAGNOSIS — I82453 Acute embolism and thrombosis of peroneal vein, bilateral: Secondary | ICD-10-CM | POA: Diagnosis not present

## 2018-06-06 DIAGNOSIS — E11649 Type 2 diabetes mellitus with hypoglycemia without coma: Secondary | ICD-10-CM | POA: Diagnosis not present

## 2018-06-06 DIAGNOSIS — Z882 Allergy status to sulfonamides status: Secondary | ICD-10-CM

## 2018-06-06 DIAGNOSIS — E860 Dehydration: Secondary | ICD-10-CM | POA: Diagnosis not present

## 2018-06-06 DIAGNOSIS — M25469 Effusion, unspecified knee: Secondary | ICD-10-CM

## 2018-06-06 DIAGNOSIS — Z7982 Long term (current) use of aspirin: Secondary | ICD-10-CM

## 2018-06-06 DIAGNOSIS — N179 Acute kidney failure, unspecified: Secondary | ICD-10-CM | POA: Diagnosis not present

## 2018-06-06 DIAGNOSIS — Y92230 Patient room in hospital as the place of occurrence of the external cause: Secondary | ICD-10-CM | POA: Diagnosis not present

## 2018-06-06 DIAGNOSIS — Z86718 Personal history of other venous thrombosis and embolism: Secondary | ICD-10-CM

## 2018-06-06 DIAGNOSIS — I10 Essential (primary) hypertension: Secondary | ICD-10-CM | POA: Diagnosis present

## 2018-06-06 DIAGNOSIS — Y712 Prosthetic and other implants, materials and accessory cardiovascular devices associated with adverse incidents: Secondary | ICD-10-CM | POA: Diagnosis not present

## 2018-06-06 DIAGNOSIS — K259 Gastric ulcer, unspecified as acute or chronic, without hemorrhage or perforation: Secondary | ICD-10-CM

## 2018-06-06 DIAGNOSIS — L89892 Pressure ulcer of other site, stage 2: Secondary | ICD-10-CM | POA: Diagnosis not present

## 2018-06-06 DIAGNOSIS — I4510 Unspecified right bundle-branch block: Secondary | ICD-10-CM | POA: Diagnosis not present

## 2018-06-06 DIAGNOSIS — M545 Low back pain: Secondary | ICD-10-CM | POA: Diagnosis present

## 2018-06-06 DIAGNOSIS — T380X5A Adverse effect of glucocorticoids and synthetic analogues, initial encounter: Secondary | ICD-10-CM | POA: Diagnosis not present

## 2018-06-06 DIAGNOSIS — I951 Orthostatic hypotension: Secondary | ICD-10-CM | POA: Diagnosis not present

## 2018-06-06 DIAGNOSIS — I2699 Other pulmonary embolism without acute cor pulmonale: Secondary | ICD-10-CM | POA: Diagnosis not present

## 2018-06-06 DIAGNOSIS — I82421 Acute embolism and thrombosis of right iliac vein: Secondary | ICD-10-CM

## 2018-06-06 DIAGNOSIS — G8250 Quadriplegia, unspecified: Secondary | ICD-10-CM | POA: Diagnosis present

## 2018-06-06 DIAGNOSIS — D72829 Elevated white blood cell count, unspecified: Secondary | ICD-10-CM | POA: Diagnosis not present

## 2018-06-06 DIAGNOSIS — Z6841 Body Mass Index (BMI) 40.0 and over, adult: Secondary | ICD-10-CM

## 2018-06-06 DIAGNOSIS — Z452 Encounter for adjustment and management of vascular access device: Secondary | ICD-10-CM

## 2018-06-06 DIAGNOSIS — I82413 Acute embolism and thrombosis of femoral vein, bilateral: Secondary | ICD-10-CM | POA: Diagnosis not present

## 2018-06-06 DIAGNOSIS — I82409 Acute embolism and thrombosis of unspecified deep veins of unspecified lower extremity: Secondary | ICD-10-CM

## 2018-06-06 DIAGNOSIS — F4323 Adjustment disorder with mixed anxiety and depressed mood: Secondary | ICD-10-CM | POA: Diagnosis not present

## 2018-06-06 DIAGNOSIS — D75A Glucose-6-phosphate dehydrogenase (G6PD) deficiency without anemia: Secondary | ICD-10-CM | POA: Diagnosis present

## 2018-06-06 DIAGNOSIS — R05 Cough: Secondary | ICD-10-CM

## 2018-06-06 DIAGNOSIS — E861 Hypovolemia: Secondary | ICD-10-CM | POA: Diagnosis not present

## 2018-06-06 DIAGNOSIS — G259 Extrapyramidal and movement disorder, unspecified: Secondary | ICD-10-CM

## 2018-06-06 DIAGNOSIS — I7121 Aneurysm of the ascending aorta, without rupture: Secondary | ICD-10-CM | POA: Diagnosis present

## 2018-06-06 DIAGNOSIS — M353 Polymyalgia rheumatica: Secondary | ICD-10-CM | POA: Diagnosis present

## 2018-06-06 DIAGNOSIS — Z419 Encounter for procedure for purposes other than remedying health state, unspecified: Secondary | ICD-10-CM

## 2018-06-06 DIAGNOSIS — D62 Acute posthemorrhagic anemia: Secondary | ICD-10-CM | POA: Diagnosis not present

## 2018-06-06 DIAGNOSIS — Z800 Family history of malignant neoplasm of digestive organs: Secondary | ICD-10-CM

## 2018-06-06 DIAGNOSIS — K8020 Calculus of gallbladder without cholecystitis without obstruction: Secondary | ICD-10-CM | POA: Diagnosis present

## 2018-06-06 DIAGNOSIS — E876 Hypokalemia: Secondary | ICD-10-CM | POA: Diagnosis not present

## 2018-06-06 DIAGNOSIS — M109 Gout, unspecified: Secondary | ICD-10-CM | POA: Diagnosis present

## 2018-06-06 DIAGNOSIS — S83203D Other tear of unspecified meniscus, current injury, right knee, subsequent encounter: Secondary | ICD-10-CM

## 2018-06-06 DIAGNOSIS — R609 Edema, unspecified: Secondary | ICD-10-CM

## 2018-06-06 DIAGNOSIS — M4802 Spinal stenosis, cervical region: Secondary | ICD-10-CM | POA: Diagnosis present

## 2018-06-06 DIAGNOSIS — M25461 Effusion, right knee: Secondary | ICD-10-CM | POA: Diagnosis not present

## 2018-06-06 DIAGNOSIS — B370 Candidal stomatitis: Secondary | ICD-10-CM | POA: Diagnosis not present

## 2018-06-06 DIAGNOSIS — G8929 Other chronic pain: Secondary | ICD-10-CM | POA: Diagnosis present

## 2018-06-06 DIAGNOSIS — R601 Generalized edema: Secondary | ICD-10-CM

## 2018-06-06 DIAGNOSIS — Z79899 Other long term (current) drug therapy: Secondary | ICD-10-CM

## 2018-06-06 DIAGNOSIS — E669 Obesity, unspecified: Secondary | ICD-10-CM | POA: Diagnosis present

## 2018-06-06 DIAGNOSIS — R059 Cough, unspecified: Secondary | ICD-10-CM

## 2018-06-06 DIAGNOSIS — E46 Unspecified protein-calorie malnutrition: Secondary | ICD-10-CM | POA: Diagnosis present

## 2018-06-06 HISTORY — DX: Glucose-6-phosphate dehydrogenase (G6PD) deficiency without anemia: D75.A

## 2018-06-06 MED ORDER — COLCHICINE 0.6 MG PO TABS
0.60 | ORAL_TABLET | ORAL | Status: DC
Start: ? — End: 2018-06-06

## 2018-06-06 MED ORDER — ATORVASTATIN CALCIUM 20 MG PO TABS
20.00 | ORAL_TABLET | ORAL | Status: DC
Start: 2018-06-07 — End: 2018-06-06

## 2018-06-06 MED ORDER — HYDROCHLOROTHIAZIDE 12.5 MG PO TABS
12.50 | ORAL_TABLET | ORAL | Status: DC
Start: ? — End: 2018-06-06

## 2018-06-06 MED ORDER — VITAMIN B-12 1000 MCG PO TABS
1000.00 | ORAL_TABLET | ORAL | Status: DC
Start: 2018-06-07 — End: 2018-06-06

## 2018-06-06 MED ORDER — ASPIRIN EC 81 MG PO TBEC
81.00 | DELAYED_RELEASE_TABLET | ORAL | Status: DC
Start: 2018-06-07 — End: 2018-06-06

## 2018-06-06 MED ORDER — ALLOPURINOL 100 MG PO TABS
100.00 | ORAL_TABLET | ORAL | Status: DC
Start: 2018-06-07 — End: 2018-06-06

## 2018-06-06 MED ORDER — APIXABAN 5 MG PO TABS
5.00 | ORAL_TABLET | ORAL | Status: DC
Start: ? — End: 2018-06-06

## 2018-06-06 MED ORDER — OXYCODONE HCL 5 MG PO TABS
10.00 | ORAL_TABLET | ORAL | Status: DC
Start: ? — End: 2018-06-06

## 2018-06-07 ENCOUNTER — Observation Stay (HOSPITAL_COMMUNITY)

## 2018-06-07 ENCOUNTER — Encounter (HOSPITAL_COMMUNITY): Payer: Self-pay | Admitting: Internal Medicine

## 2018-06-07 ENCOUNTER — Inpatient Hospital Stay (HOSPITAL_COMMUNITY)

## 2018-06-07 DIAGNOSIS — G959 Disease of spinal cord, unspecified: Secondary | ICD-10-CM | POA: Diagnosis not present

## 2018-06-07 DIAGNOSIS — R0989 Other specified symptoms and signs involving the circulatory and respiratory systems: Secondary | ICD-10-CM | POA: Diagnosis not present

## 2018-06-07 DIAGNOSIS — G8250 Quadriplegia, unspecified: Secondary | ICD-10-CM | POA: Diagnosis not present

## 2018-06-07 DIAGNOSIS — G9520 Unspecified cord compression: Secondary | ICD-10-CM | POA: Diagnosis present

## 2018-06-07 DIAGNOSIS — Y712 Prosthetic and other implants, materials and accessory cardiovascular devices associated with adverse incidents: Secondary | ICD-10-CM | POA: Diagnosis not present

## 2018-06-07 DIAGNOSIS — M23206 Derangement of unspecified meniscus due to old tear or injury, right knee: Secondary | ICD-10-CM | POA: Diagnosis not present

## 2018-06-07 DIAGNOSIS — I82812 Embolism and thrombosis of superficial veins of left lower extremities: Secondary | ICD-10-CM | POA: Diagnosis not present

## 2018-06-07 DIAGNOSIS — I2699 Other pulmonary embolism without acute cor pulmonale: Secondary | ICD-10-CM | POA: Diagnosis not present

## 2018-06-07 DIAGNOSIS — I951 Orthostatic hypotension: Secondary | ICD-10-CM | POA: Diagnosis not present

## 2018-06-07 DIAGNOSIS — Y92230 Patient room in hospital as the place of occurrence of the external cause: Secondary | ICD-10-CM | POA: Diagnosis not present

## 2018-06-07 DIAGNOSIS — Z6841 Body Mass Index (BMI) 40.0 and over, adult: Secondary | ICD-10-CM | POA: Diagnosis not present

## 2018-06-07 DIAGNOSIS — E46 Unspecified protein-calorie malnutrition: Secondary | ICD-10-CM | POA: Diagnosis not present

## 2018-06-07 DIAGNOSIS — M109 Gout, unspecified: Secondary | ICD-10-CM | POA: Diagnosis present

## 2018-06-07 DIAGNOSIS — N179 Acute kidney failure, unspecified: Secondary | ICD-10-CM | POA: Diagnosis not present

## 2018-06-07 DIAGNOSIS — D72829 Elevated white blood cell count, unspecified: Secondary | ICD-10-CM | POA: Diagnosis not present

## 2018-06-07 DIAGNOSIS — M7989 Other specified soft tissue disorders: Secondary | ICD-10-CM | POA: Diagnosis not present

## 2018-06-07 DIAGNOSIS — R52 Pain, unspecified: Secondary | ICD-10-CM | POA: Diagnosis not present

## 2018-06-07 DIAGNOSIS — R200 Anesthesia of skin: Secondary | ICD-10-CM | POA: Diagnosis not present

## 2018-06-07 DIAGNOSIS — R001 Bradycardia, unspecified: Secondary | ICD-10-CM | POA: Diagnosis not present

## 2018-06-07 DIAGNOSIS — L8990 Pressure ulcer of unspecified site, unspecified stage: Secondary | ICD-10-CM | POA: Diagnosis not present

## 2018-06-07 DIAGNOSIS — D696 Thrombocytopenia, unspecified: Secondary | ICD-10-CM | POA: Diagnosis not present

## 2018-06-07 DIAGNOSIS — G9519 Other vascular myelopathies: Secondary | ICD-10-CM | POA: Diagnosis not present

## 2018-06-07 DIAGNOSIS — Z882 Allergy status to sulfonamides status: Secondary | ICD-10-CM | POA: Diagnosis not present

## 2018-06-07 DIAGNOSIS — F419 Anxiety disorder, unspecified: Secondary | ICD-10-CM | POA: Diagnosis not present

## 2018-06-07 DIAGNOSIS — M5136 Other intervertebral disc degeneration, lumbar region: Secondary | ICD-10-CM | POA: Diagnosis not present

## 2018-06-07 DIAGNOSIS — I82421 Acute embolism and thrombosis of right iliac vein: Secondary | ICD-10-CM | POA: Diagnosis not present

## 2018-06-07 DIAGNOSIS — I82413 Acute embolism and thrombosis of femoral vein, bilateral: Secondary | ICD-10-CM | POA: Diagnosis not present

## 2018-06-07 DIAGNOSIS — R609 Edema, unspecified: Secondary | ICD-10-CM | POA: Diagnosis not present

## 2018-06-07 DIAGNOSIS — Z86718 Personal history of other venous thrombosis and embolism: Secondary | ICD-10-CM | POA: Diagnosis not present

## 2018-06-07 DIAGNOSIS — S83203D Other tear of unspecified meniscus, current injury, right knee, subsequent encounter: Secondary | ICD-10-CM | POA: Diagnosis not present

## 2018-06-07 DIAGNOSIS — M25461 Effusion, right knee: Secondary | ICD-10-CM | POA: Diagnosis not present

## 2018-06-07 DIAGNOSIS — Z886 Allergy status to analgesic agent status: Secondary | ICD-10-CM | POA: Diagnosis not present

## 2018-06-07 DIAGNOSIS — M25561 Pain in right knee: Secondary | ICD-10-CM | POA: Diagnosis not present

## 2018-06-07 DIAGNOSIS — R600 Localized edema: Secondary | ICD-10-CM | POA: Diagnosis not present

## 2018-06-07 DIAGNOSIS — I10 Essential (primary) hypertension: Secondary | ICD-10-CM | POA: Diagnosis present

## 2018-06-07 DIAGNOSIS — F332 Major depressive disorder, recurrent severe without psychotic features: Secondary | ICD-10-CM | POA: Diagnosis not present

## 2018-06-07 DIAGNOSIS — R5081 Fever presenting with conditions classified elsewhere: Secondary | ICD-10-CM | POA: Diagnosis not present

## 2018-06-07 DIAGNOSIS — D75A Glucose-6-phosphate dehydrogenase (G6PD) deficiency without anemia: Secondary | ICD-10-CM | POA: Diagnosis not present

## 2018-06-07 DIAGNOSIS — F4323 Adjustment disorder with mixed anxiety and depressed mood: Secondary | ICD-10-CM | POA: Diagnosis not present

## 2018-06-07 DIAGNOSIS — I82453 Acute embolism and thrombosis of peroneal vein, bilateral: Secondary | ICD-10-CM | POA: Diagnosis not present

## 2018-06-07 DIAGNOSIS — D689 Coagulation defect, unspecified: Secondary | ICD-10-CM | POA: Diagnosis not present

## 2018-06-07 DIAGNOSIS — G259 Extrapyramidal and movement disorder, unspecified: Secondary | ICD-10-CM | POA: Diagnosis not present

## 2018-06-07 DIAGNOSIS — I82B12 Acute embolism and thrombosis of left subclavian vein: Secondary | ICD-10-CM | POA: Diagnosis not present

## 2018-06-07 DIAGNOSIS — M4712 Other spondylosis with myelopathy, cervical region: Secondary | ICD-10-CM | POA: Diagnosis not present

## 2018-06-07 DIAGNOSIS — R509 Fever, unspecified: Secondary | ICD-10-CM | POA: Diagnosis not present

## 2018-06-07 DIAGNOSIS — I82409 Acute embolism and thrombosis of unspecified deep veins of unspecified lower extremity: Secondary | ICD-10-CM | POA: Diagnosis not present

## 2018-06-07 DIAGNOSIS — D550 Anemia due to glucose-6-phosphate dehydrogenase [G6PD] deficiency: Secondary | ICD-10-CM | POA: Diagnosis not present

## 2018-06-07 DIAGNOSIS — Z8739 Personal history of other diseases of the musculoskeletal system and connective tissue: Secondary | ICD-10-CM | POA: Diagnosis not present

## 2018-06-07 DIAGNOSIS — I82443 Acute embolism and thrombosis of tibial vein, bilateral: Secondary | ICD-10-CM | POA: Diagnosis not present

## 2018-06-07 DIAGNOSIS — M25511 Pain in right shoulder: Secondary | ICD-10-CM | POA: Diagnosis not present

## 2018-06-07 DIAGNOSIS — I495 Sick sinus syndrome: Secondary | ICD-10-CM | POA: Diagnosis not present

## 2018-06-07 DIAGNOSIS — J189 Pneumonia, unspecified organism: Secondary | ICD-10-CM | POA: Diagnosis not present

## 2018-06-07 DIAGNOSIS — M2550 Pain in unspecified joint: Secondary | ICD-10-CM | POA: Diagnosis not present

## 2018-06-07 DIAGNOSIS — D62 Acute posthemorrhagic anemia: Secondary | ICD-10-CM | POA: Diagnosis not present

## 2018-06-07 DIAGNOSIS — M25572 Pain in left ankle and joints of left foot: Secondary | ICD-10-CM | POA: Diagnosis not present

## 2018-06-07 DIAGNOSIS — T82524A Displacement of infusion catheter, initial encounter: Secondary | ICD-10-CM | POA: Diagnosis not present

## 2018-06-07 DIAGNOSIS — Z789 Other specified health status: Secondary | ICD-10-CM | POA: Diagnosis not present

## 2018-06-07 DIAGNOSIS — R6510 Systemic inflammatory response syndrome (SIRS) of non-infectious origin without acute organ dysfunction: Secondary | ICD-10-CM | POA: Diagnosis not present

## 2018-06-07 DIAGNOSIS — B370 Candidal stomatitis: Secondary | ICD-10-CM | POA: Diagnosis not present

## 2018-06-07 DIAGNOSIS — M25512 Pain in left shoulder: Secondary | ICD-10-CM | POA: Diagnosis not present

## 2018-06-07 DIAGNOSIS — D649 Anemia, unspecified: Secondary | ICD-10-CM | POA: Diagnosis not present

## 2018-06-07 DIAGNOSIS — I82433 Acute embolism and thrombosis of popliteal vein, bilateral: Secondary | ICD-10-CM | POA: Diagnosis not present

## 2018-06-07 DIAGNOSIS — Z981 Arthrodesis status: Secondary | ICD-10-CM | POA: Diagnosis not present

## 2018-06-07 DIAGNOSIS — I82622 Acute embolism and thrombosis of deep veins of left upper extremity: Secondary | ICD-10-CM | POA: Diagnosis not present

## 2018-06-07 DIAGNOSIS — M549 Dorsalgia, unspecified: Secondary | ICD-10-CM | POA: Diagnosis not present

## 2018-06-07 DIAGNOSIS — I824Y9 Acute embolism and thrombosis of unspecified deep veins of unspecified proximal lower extremity: Secondary | ICD-10-CM | POA: Diagnosis not present

## 2018-06-07 DIAGNOSIS — R601 Generalized edema: Secondary | ICD-10-CM | POA: Diagnosis not present

## 2018-06-07 DIAGNOSIS — Z86711 Personal history of pulmonary embolism: Secondary | ICD-10-CM | POA: Diagnosis not present

## 2018-06-07 LAB — URINALYSIS, ROUTINE W REFLEX MICROSCOPIC
BILIRUBIN URINE: NEGATIVE
Glucose, UA: NEGATIVE mg/dL
KETONES UR: 5 mg/dL — AB
Leukocytes, UA: NEGATIVE
Nitrite: NEGATIVE
PROTEIN: 30 mg/dL — AB
Specific Gravity, Urine: 1.028 (ref 1.005–1.030)
pH: 5 (ref 5.0–8.0)

## 2018-06-07 LAB — CBC
HCT: 27.2 % — ABNORMAL LOW (ref 39.0–52.0)
HEMOGLOBIN: 8.4 g/dL — AB (ref 13.0–17.0)
MCH: 28.4 pg (ref 26.0–34.0)
MCHC: 30.9 g/dL (ref 30.0–36.0)
MCV: 91.9 fL (ref 78.0–100.0)
PLATELETS: 208 10*3/uL (ref 150–400)
RBC: 2.96 MIL/uL — ABNORMAL LOW (ref 4.22–5.81)
RDW: 14.2 % (ref 11.5–15.5)
WBC: 8.1 10*3/uL (ref 4.0–10.5)

## 2018-06-07 LAB — COMPREHENSIVE METABOLIC PANEL
ALK PHOS: 71 U/L (ref 38–126)
ALT: 17 U/L (ref 0–44)
AST: 26 U/L (ref 15–41)
Albumin: 2.8 g/dL — ABNORMAL LOW (ref 3.5–5.0)
Anion gap: 10 (ref 5–15)
BILIRUBIN TOTAL: 0.8 mg/dL (ref 0.3–1.2)
BUN: 9 mg/dL (ref 6–20)
CALCIUM: 9.1 mg/dL (ref 8.9–10.3)
CO2: 24 mmol/L (ref 22–32)
CREATININE: 0.97 mg/dL (ref 0.61–1.24)
Chloride: 103 mmol/L (ref 98–111)
GFR calc Af Amer: >60 mL/min (ref >60)
Glucose, Bld: 91 mg/dL (ref 70–99)
Potassium: 4 mmol/L (ref 3.5–5.1)
SODIUM: 137 mmol/L (ref 135–145)
Total Protein: 6.5 g/dL (ref 6.5–8.1)

## 2018-06-07 LAB — BASIC METABOLIC PANEL
Anion gap: 12 (ref 5–15)
BUN: 9 mg/dL (ref 6–20)
CALCIUM: 8.9 mg/dL (ref 8.9–10.3)
CO2: 23 mmol/L (ref 22–32)
CREATININE: 0.97 mg/dL (ref 0.61–1.24)
Chloride: 102 mmol/L (ref 98–111)
GFR calc Af Amer: >60 mL/min (ref >60)
GLUCOSE: 89 mg/dL (ref 70–99)
Potassium: 3.7 mmol/L (ref 3.5–5.1)
SODIUM: 137 mmol/L (ref 135–145)

## 2018-06-07 LAB — CBC WITH DIFFERENTIAL/PLATELET
Abs Immature Granulocytes: 0 10*3/uL (ref 0.0–0.1)
BASOS PCT: 0 %
Basophils Absolute: 0 10*3/uL (ref 0.0–0.1)
EOS ABS: 0.1 10*3/uL (ref 0.0–0.7)
Eosinophils Relative: 2 %
HCT: 32.3 % — ABNORMAL LOW (ref 39.0–52.0)
Hemoglobin: 9.7 g/dL — ABNORMAL LOW (ref 13.0–17.0)
IMMATURE GRANULOCYTES: 0 %
Lymphocytes Relative: 35 %
Lymphs Abs: 2.7 10*3/uL (ref 0.7–4.0)
MCH: 27.7 pg (ref 26.0–34.0)
MCHC: 30 g/dL (ref 30.0–36.0)
MCV: 92.3 fL (ref 78.0–100.0)
MONOS PCT: 12 %
Monocytes Absolute: 0.9 10*3/uL (ref 0.1–1.0)
Neutro Abs: 3.9 10*3/uL (ref 1.7–7.7)
Neutrophils Relative %: 51 %
PLATELETS: 203 10*3/uL (ref 150–400)
RBC: 3.5 MIL/uL — ABNORMAL LOW (ref 4.22–5.81)
RDW: 14.1 % (ref 11.5–15.5)
WBC: 7.7 10*3/uL (ref 4.0–10.5)

## 2018-06-07 LAB — SEDIMENTATION RATE
Sed Rate: 137 mm/hr — ABNORMAL HIGH (ref 0–16)
Sed Rate: >140 mm/hr — ABNORMAL HIGH (ref 0–16)

## 2018-06-07 LAB — HEPARIN LEVEL (UNFRACTIONATED): HEPARIN UNFRACTIONATED: 1.02 [IU]/mL — AB (ref 0.30–0.70)

## 2018-06-07 LAB — PROTIME-INR
INR: 1.37
Prothrombin Time: 16.7 seconds — ABNORMAL HIGH (ref 11.4–15.2)

## 2018-06-07 LAB — APTT: APTT: 83 s — AB (ref 24–36)

## 2018-06-07 LAB — URIC ACID: URIC ACID, SERUM: 6.9 mg/dL (ref 3.7–8.6)

## 2018-06-07 LAB — HIV ANTIBODY (ROUTINE TESTING W REFLEX): HIV Screen 4th Generation wRfx: NONREACTIVE

## 2018-06-07 LAB — C-REACTIVE PROTEIN: CRP: 20.4 mg/dL — ABNORMAL HIGH (ref <1.0)

## 2018-06-07 MED ORDER — MORPHINE SULFATE (PF) 2 MG/ML IV SOLN
2.0000 mg | INTRAVENOUS | Status: DC | PRN
  Administered 2018-06-07 – 2018-08-16 (×15): 2 mg via INTRAVENOUS
  Filled 2018-06-07 (×15): qty 1

## 2018-06-07 MED ORDER — METHOCARBAMOL 1000 MG/10ML IJ SOLN
500.0000 mg | Freq: Four times a day (QID) | INTRAVENOUS | Status: DC | PRN
  Administered 2018-06-07 – 2018-06-10 (×5): 500 mg via INTRAVENOUS
  Filled 2018-06-07: qty 5
  Filled 2018-06-07 (×2): qty 500
  Filled 2018-06-07 (×6): qty 5

## 2018-06-07 MED ORDER — CYCLOBENZAPRINE HCL 10 MG PO TABS
10.0000 mg | ORAL_TABLET | Freq: Once | ORAL | Status: AC
  Administered 2018-06-07: 10 mg via ORAL
  Filled 2018-06-07: qty 1

## 2018-06-07 MED ORDER — DEXAMETHASONE SODIUM PHOSPHATE 10 MG/ML IJ SOLN: 4.0000 mg | Freq: Four times a day (QID) | INTRAMUSCULAR | Status: DC

## 2018-06-07 MED ORDER — GADOBUTROL 1 MMOL/ML IV SOLN
10.0000 mL | Freq: Once | INTRAVENOUS | Status: AC | PRN
  Administered 2018-06-07: 10 mL via INTRAVENOUS

## 2018-06-07 MED ORDER — ONDANSETRON HCL 4 MG/2ML IJ SOLN
4.0000 mg | Freq: Four times a day (QID) | INTRAMUSCULAR | Status: DC | PRN
  Filled 2018-06-07 (×3): qty 2

## 2018-06-07 MED ORDER — ACETAMINOPHEN 325 MG PO TABS
650.0000 mg | ORAL_TABLET | Freq: Four times a day (QID) | ORAL | Status: DC | PRN
  Administered 2018-06-07 – 2018-06-16 (×4): 650 mg via ORAL
  Filled 2018-06-07 (×5): qty 2

## 2018-06-07 MED ORDER — ONDANSETRON HCL 4 MG PO TABS
4.0000 mg | ORAL_TABLET | Freq: Four times a day (QID) | ORAL | Status: DC | PRN
  Administered 2018-06-19: 4 mg via ORAL
  Filled 2018-06-07: qty 1

## 2018-06-07 MED ORDER — HEPARIN (PORCINE) IN NACL 100-0.45 UNIT/ML-% IJ SOLN
850.0000 [IU]/h | INTRAMUSCULAR | Status: AC
  Administered 2018-06-07 – 2018-06-10 (×5): 1500 [IU]/h via INTRAVENOUS
  Administered 2018-06-10: 1250 [IU]/h via INTRAVENOUS
  Administered 2018-06-13: 850 [IU]/h via INTRAVENOUS
  Filled 2018-06-07 (×8): qty 250

## 2018-06-07 MED ORDER — ACETAMINOPHEN 650 MG RE SUPP: 650.0000 mg | Freq: Four times a day (QID) | RECTAL | Status: DC | PRN

## 2018-06-07 MED ORDER — HYDROCHLOROTHIAZIDE 12.5 MG PO CAPS
12.5000 mg | ORAL_CAPSULE | Freq: Every day | ORAL | Status: DC
  Administered 2018-06-07 – 2018-06-09 (×3): 12.5 mg via ORAL
  Filled 2018-06-07 (×3): qty 1

## 2018-06-07 NOTE — Consult Note (Signed)
Reason for Consult: Myelopathy Referring Physician: Tried hospitalist  [name] [name] is an 52 y.o. male.  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  Patient underwent anterior cervical corpectomy and was progressing very well until August he was gaining in strength was increasing his ambulation had a fall had to be admitted back to the rehab department where he was diagnosed with meniscal tears in his knees and apparently also had some fluid at some point aspirated from his knee that he said was a yellowish colored fluid he was told he had gout.  He has been intermittently spiking fevers for the last month periodically and he stayed in rehab recovered to where he was amatory with a walker again and then had multiple additional falls where his legs gave way from.  He reports he has been recently experiencing severe low back pain he relates it to transitioning in a move in the bed that happened at the outside hospital.  He has been having a little more neck pain he denies any new numbness or tingling in his arms or his hands denies any new numbness or tingling in his legs.  He does not understand why his legs are giving way on him but does relate a lot of the pain seems to be originating in his knees and his knees appear to be buckling underneath him.  In addition the low back pain is keeping him from moving around in bed today.  Past Medical History:  Diagnosis Date  . Degenerative disc disease, lumbar   . Gout   . History of pernicious anemia 1980   Patient says he takes OTC iron supplements QD  . Hypertension     Past Surgical History:  Procedure Laterality Date  . ANTERIOR CERVICAL CORPECTOMY N/A 03/24/2018   Procedure: CERVICAL FOUR ANTERIOR CERVICAL CORPECTOMY;  Surgeon: Kary Kos, MD;  Location: [place];  Service: Neurosurgery;  Laterality: N/A;  .  ANTERIOR CERVICAL DECOMP/DISCECTOMY FUSION N/A 03/28/2018   Procedure: Reexploration of anterior cervical wound;  Surgeon: Kary Kos, MD;  Location: [facility];  Service: Neurosurgery;  Laterality: N/A;  . IR IVC FILTER PLMT / S&I Burke Keels GUID/MOD SED  03/28/2018  . IR RADIOLOGIST EVAL & MGMT  05/31/2018  . MENISCUS REPAIR Left   . TENDON REPAIR     tricept tendon    Family History  Family history unknown: Yes    Social History:  reports that he has never smoked. He has never used smokeless tobacco. He reports that he does not drink alcohol or use drugs.  Allergies:  Allergies  Allergen Reactions  . Nsaids Other (See Comments)    G6PD  . Sulfa Antibiotics Other (See Comments)    G6PD    Medications: I have reviewed the patient's current medications.  Results for orders placed or performed during the hospital encounter of 06/06/18 (from the past 48 hour(s))  Comprehensive metabolic panel     Status: Abnormal   Collection Time: 06/07/18 12:21 AM  Result Value Ref Range   Sodium 137 135 - 145 mmol/L   Potassium 4.0 3.5 - 5.1 mmol/L   Chloride 103 98 - 111 mmol/L   CO2 24 22 - 32 mmol/L   Glucose, Bld 91 70 - 99 mg/dL   BUN 9 6 - 20 mg/dL   Creatinine, Ser 0.97 0.61 - 1.24 mg/dL   Calcium  9.1 8.9 - 10.3 mg/dL   Total Protein 6.5 6.5 - 8.1 g/dL   Albumin 2.8 (L) 3.5 - 5.0 g/dL   AST 26 15 - 41 U/L   ALT 17 0 - 44 U/L   Alkaline Phosphatase 71 38 - 126 U/L   Total Bilirubin 0.8 0.3 - 1.2 mg/dL   GFR calc non Af Amer >60 >60 mL/min   GFR calc Af Amer >60 >60 mL/min    Comment: (NOTE) The eGFR has been calculated using the CKD EPI equation. This calculation has not been validated in all clinical situations. eGFR's persistently <60 mL/min signify possible Chronic Kidney Disease.    Anion gap 10 5 - 15    Comment: Performed at [facility] [address] [place], [place] [address]  CBC with Differential/Platelet     Status: Abnormal   Collection Time: 06/07/18 12:21 AM   Result Value Ref Range   WBC 7.7 4.0 - 10.5 K/uL   RBC 3.50 (L) 4.22 - 5.81 MIL/uL   Hemoglobin 9.7 (L) 13.0 - 17.0 g/dL   HCT 32.3 (L) 39.0 - 52.0 %   MCV 92.3 78.0 - 100.0 fL   MCH 27.7 26.0 - 34.0 pg   MCHC 30.0 30.0 - 36.0 g/dL   RDW 14.1 11.5 - 15.5 %   Platelets 203 150 - 400 K/uL   Neutrophils Relative % 51 %   Neutro Abs 3.9 1.7 - 7.7 K/uL   Lymphocytes Relative 35 %   Lymphs Abs 2.7 0.7 - 4.0 K/uL   Monocytes Relative 12 %   Monocytes Absolute 0.9 0.1 - 1.0 K/uL   Eosinophils Relative 2 %   Eosinophils Absolute 0.1 0.0 - 0.7 K/uL   Basophils Relative 0 %   Basophils Absolute 0.0 0.0 - 0.1 K/uL   Immature Granulocytes 0 %   Abs Immature Granulocytes 0.0 0.0 - 0.1 K/uL    Comment: Performed at [facility], [address], [place], [place] [address]  Uric acid     Status: None   Collection Time: 06/07/18 12:21 AM  Result Value Ref Range   Uric Acid, Serum 6.9 3.7 - 8.6 mg/dL    Comment: Performed at [facility] [address], [place], [place] [address]  Protime-INR     Status: Abnormal   Collection Time: 06/07/18 12:21 AM  Result Value Ref Range   Prothrombin Time 16.7 (H) 11.4 - 15.2 seconds   INR 1.37     Comment: Performed at [facility] [address] [place], [place] [address]  Sedimentation rate     Status: Abnormal   Collection Time: 06/07/18 12:21 AM  Result Value Ref Range   Sed Rate 137 (H) 0 - 16 mm/hr    Comment: Performed at [facility] [address], [place], [place] [address]  Basic metabolic panel     Status: None   Collection Time: 06/07/18  4:08 AM  Result Value Ref Range   Sodium 137 135 - 145 mmol/L   Potassium 3.7 3.5 - 5.1 mmol/L   Chloride 102 98 - 111 mmol/L   CO2 23 22 - 32 mmol/L   Glucose, Bld 89 70 - 99 mg/dL   BUN 9 6 - 20 mg/dL   Creatinine, Ser 0.97 0.61 - 1.24 mg/dL   Calcium 8.9 8.9 - 10.3 mg/dL   GFR calc non Af Amer >60 >60 mL/min   GFR calc Af Amer >60 >60 mL/min  Comment: (NOTE) The  eGFR has been calculated using the CKD EPI equation. This calculation has not been validated in all clinical situations. eGFR's persistently <60 mL/min signify possible Chronic Kidney Disease.    Anion gap 12 5 - 15    Comment: Performed at [facility] [address] [place], [place] [address]  CBC     Status: Abnormal   Collection Time: 06/07/18  4:08 AM  Result Value Ref Range   WBC 8.1 4.0 - 10.5 K/uL   RBC 2.96 (L) 4.22 - 5.81 MIL/uL   Hemoglobin 8.4 (L) 13.0 - 17.0 g/dL   HCT 27.2 (L) 39.0 - 52.0 %   MCV 91.9 78.0 - 100.0 fL   MCH 28.4 26.0 - 34.0 pg   MCHC 30.9 30.0 - 36.0 g/dL   RDW 14.2 11.5 - 15.5 %   Platelets 208 150 - 400 K/uL    Comment: Performed at [facility], [place] [address], [place], [place] [address]    No results found.  Review of Systems  Musculoskeletal: Positive for back pain, falls, joint pain, myalgias and neck pain.  Neurological: Positive for tingling and sensory change.   Blood pressure 123/87, pulse (!) 102, temperature 99.6 F (37.6 C), temperature source Oral, resp. rate 17, height '5\' 9"'  (1.753 m), weight 123.8 kg, SpO2 96 %. Physical Exam  Constitutional: He is oriented to person, place, and time.  Neurological: He is alert and oriented to person, place, and time.  She is awake and alert upper extremity strength is 4 to 4+ out of 5 throughout all muscle groups lower extremity strength I am unable to examine because he is having such severe low back pain.  He seems to have 5 out of 5 strength distally however he cannot lift his legs up off the bed secondary to pain in his low back.    Assessment/Plan: 52 year old gentleman with cervical myelopathy and a quadriparesis that apparently was getting better and I have a single image from an MRI scan done at outside hospital we do not have the additional images to review that looks like his spinal cord is significantly more decompressed than it was preoperatively.  He still has some  spondylosis above and below where the surgery was but this had did look like it it changed.  So I do not think that that is contributing to his progression.  I will have to repeat the cervical spine MRI to get a more clear looking determination.  However I have greater concern about the severity of his increased low back pain especially with movement of his legs.  On admission sedimentation rate was 137 which is highly suggestive of infection discitis epidural abscess septic joint etc.  He is getting blood cultures already drawn I have added a CRP I have ordered MRI scans of his cervical and lumbar spine we will stop his IV steroids until we get the results of his MRI scans.  [name] [name] P 06/07/2018, 7:56 AM

## 2018-06-07 NOTE — Progress Notes (Signed)
Patient ID: [name] [name], male   DOB: 12-15-1965, 52 y.o.   MRN: [id number] Studies reviewed both cervical and lumbar MRI as well as C-spine cervical CT scan.  Persistent ossification the posterior longitudinal ligament with spurring left side of the canal however significant improved patency of the canal behind the corpectomy site at C4.  Patient will need posterior decompression at some point.  However I do not see any sign of infection I do not see any explanation for a sed rate greater than 140 on any of his spinal imaging.  So I feel like we are missing a severe infection somewhere and I recommend may be proceeding forward with an our MRI of his knee or aspiration of his knee.  There is been no acute change in his spine that would account and that would explain his sudden deterioration and certainly nothing to explain his fevers.

## 2018-06-07 NOTE — Progress Notes (Signed)
Patient seen and evaluated, chart reviewed, please see EMR for updated orders. Please see full H&P dictated by admitting physician Dr Hal Hope for same date of service.    52 y.o. male with history of hypertension who has had a recent ACDF at C3 C5 by Dr. Saintclair Halsted in July 8343 complicated by pulmonary embolism was placed on apixaban subsequently discharged to rehab and patient had moved to [place] area to [place], currently went home to live with his parents.  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 Saintclair Halsted from neurosurgery, patient has completed cervical and lumbar MRI studies,  Possibly need ID input  If neurosurgery does not plan any spine procedure/biopsy for possible discitis or further aspiration of patient's knee then may consider IV antibiotics for possible/presumed UTI if okay with ID however UTI would not explain an ESR of >140 and CRP of 20   Patient seen and evaluated, chart reviewed, please see EMR for updated orders. Please see full H&P dictated by admitting physician Dr Hal Hope for same date of service.

## 2018-06-07 NOTE — Progress Notes (Signed)
ANTICOAGULATION CONSULT NOTE - Initial Consult  Pharmacy Consult for Heparin (Apixaban on hold) Indication: pulmonary embolus  Allergies  Allergen Reactions  . Nsaids Other (See Comments)    G6PD  . Sulfa Antibiotics Other (See Comments)    G6PD    Patient Measurements: Height: 5\' 9"  (175.3 cm) Weight: 272 lb 14.9 oz (123.8 kg) IBW/kg (Calculated) : 70.7  Vital Signs: Temp: 99.6 F (37.6 C) (09/25 0748) Temp Source: Oral (09/25 0748) BP: 123/87 (09/25 0748) Pulse Rate: 102 (09/25 0748)  Labs: Recent Labs    06/07/18 0021 06/07/18 0408 06/07/18 1029  HGB 9.7* 8.4*  --   HCT 32.3* 27.2*  --   PLT 203 208  --   APTT  --   --  83*  LABPROT 16.7*  --   --   INR 1.37  --   --   HEPARINUNFRC  --   --  1.02*  CREATININE 0.97 0.97  --     Estimated Creatinine Clearance: 115.8 mL/min (by C-G formula based on SCr of 0.97 mg/dL).   Medical History: Past Medical History:  Diagnosis Date  . Degenerative disc disease, lumbar   . Gout   . History of pernicious anemia 1980   Patient says he takes OTC iron supplements QD  . Hypertension     Assessment: 52 y/o M with recent hx of bilateral PE/DVT, on apixaban PTA, holding apixaban and starting heparin, last dose apixaban >12 hours ago per pt  PTT therapeutic CBC stable  Goal of Therapy:  Heparin level 0.3-0.7 units/ml aPTT 66-102 seconds Monitor platelets by anticoagulation protocol: Yes   Plan:  Continue heparin drip at 1500 units / hr Daily CBC/HL/aPTT Monitor for bleeding  Thank you Okey Regal, PharmD [phone]  06/07/2018,1:20 PM

## 2018-06-07 NOTE — H&P (Addendum)
History and Physical    [name] [name] ZOX:[id number] DOB: Nov 02, 1965 DOA: 06/06/2018  PCP: Devra Dopp, MD  Patient coming from: Patient was transferred from [facility].  Chief Complaint: Bilateral lower extremity weakness and muscle spasms.  HPI: [name] [name] is a 52 y.o. male with history of hypertension who has had a recent ACDF at C3 C5 by Dr. Wynetta Emery in July 2019 complicated by pulmonary embolism was placed on apixaban subsequently discharged to rehab and patient had moved to [place] area to [place] and has been living with his parents.  Had gone to ER around August 27 for complaints of increasing weakness and at the time was found to be febrile and was admitted for possible pneumonia.  He started antibiotic as per the ER physician with whom I discussed.  Patient was eventually discharged to rehab and patient was discharged from rehab last week.  Patient had come to [facility] for possible IVC filter placement for his known DVT and on reaching back home in [place] patient on trying to get out of the car started finding it difficult to walk and his legs gave away.  He also states he inadvertently turned his neck.  Following which patient has been having increasing weakness difficult to walk.  Prior to which patient was able to walk up the stairs.  Denies any incontinence of urine or bowel.  Neck pain has been chronic.  Denies any chest pain or shortness of breath.  Patient presented to the ER and in the ER patient had MRI of the C-spine done which shows postoperative changes and also cord compression in the cervical area with cord edema and possibility of ischemia.  Dr. Wynetta Emery patient neurosurgeon was consulted and advised transferred to [facility] and the hospitalist service.  Patient also has been running fever for last 2 months.  In the ER at [facility] chest x-ray UA unremarkable blood cultures obtained did not start any antibiotics.  On my exam  patient is not in distress.  Has difficulty moving his both lower extremities.  Has frequent muscle spasm with pain in the lower extremity.  Also complains of low back pain.  I have reviewed patient's chart from [facility].  MRI of the spine did not show any evidence of epidural hemorrhage or abscess or paraspinal fluid collection.  Sodium was 136 potassium 3.9 chloride 95 bicarb 25 creatinine 1.01.  WBC was 11.9 hemoglobin 10.3.  Platelets 254.  ED Course: Patient is a direct admit.  Review of Systems: As per HPI, rest all negative.   Past Medical History:  Diagnosis Date  . Degenerative disc disease, lumbar   . Gout   . History of pernicious anemia 1980   Patient says he takes OTC iron supplements QD  . Hypertension     Past Surgical History:  Procedure Laterality Date  . ANTERIOR CERVICAL CORPECTOMY N/A 03/24/2018   Procedure: CERVICAL FOUR ANTERIOR CERVICAL CORPECTOMY;  Surgeon: Donalee Citrin, MD;  [facility];  Service: Neurosurgery;  Laterality: N/A;  . ANTERIOR CERVICAL DECOMP/DISCECTOMY FUSION N/A 03/28/2018   Procedure: Reexploration of anterior cervical wound;  Surgeon: Donalee Citrin, MD;  Location: [facility] OR;  Service: Neurosurgery;  Laterality: N/A;  . IR IVC FILTER PLMT / S&I Lenise Arena GUID/MOD SED  03/28/2018  . IR RADIOLOGIST EVAL & MGMT  05/31/2018  . MENISCUS REPAIR Left   . TENDON REPAIR     tricept tendon     reports that he has never smoked.  He has never used smokeless tobacco. He reports that he does not drink alcohol or use drugs.  Allergies  Allergen Reactions  . Nsaids Other (See Comments)    G6PD  . Sulfa Antibiotics Other (See Comments)    G6PD    Family History  Family history unknown: Yes    Prior to Admission medications   Medication Sig Start Date End Date Taking? Authorizing Provider  acetaminophen (TYLENOL) 325 MG tablet Take 2 tablets (650 mg total) by mouth every 4 (four) hours as needed for mild pain ((score 1 to 3) or temp > 100.5).  05/02/18   Angiulli, Mcarthur Rossetti, PA-C  apixaban (ELIQUIS) 5 MG TABS tablet Take 1 tablet (5 mg total) by mouth 2 (two) times daily. 05/02/18   Angiulli, Mcarthur Rossetti, PA-C  aspirin EC 81 MG EC tablet Take 1 tablet (81 mg total) by mouth daily. 05/02/18   Angiulli, Mcarthur Rossetti, PA-C  baclofen (LIORESAL) 10 MG tablet Take 1 tablet (10 mg total) by mouth 2 (two) times daily as needed for muscle spasms. 05/02/18   Angiulli, Mcarthur Rossetti, PA-C  colchicine 0.6 MG tablet Take 1 tablet (0.6 mg total) by mouth daily. 05/02/18   Angiulli, Mcarthur Rossetti, PA-C  Cyanocobalamin 5000 MCG/ML LIQD Place 5,000 mcg under the tongue daily. 05/02/18   Angiulli, Mcarthur Rossetti, PA-C  hydrochlorothiazide (MICROZIDE) 12.5 MG capsule Take 1 capsule (12.5 mg total) by mouth daily. 05/02/18   Angiulli, Mcarthur Rossetti, PA-C  oxyCODONE 10 MG TABS Take 1 tablet (10 mg total) by mouth every 3 (three) hours as needed for severe pain ((score 7 to 10)). 05/02/18   Angiulli, Mcarthur Rossetti, PA-C  polycarbophil (FIBERCON) 625 MG tablet Take 2 tablets (1,250 mg total) by mouth daily. 05/02/18   Angiulli, Mcarthur Rossetti, PA-C  predniSONE (DELTASONE) 20 MG tablet Take 1 tablet (20 mg total) by mouth 2 (two) times daily with a meal. 05/02/18   Angiulli, Mcarthur Rossetti, PA-C  saccharomyces boulardii (FLORASTOR) 250 MG capsule Take 1 capsule (250 mg total) by mouth 2 (two) times daily. 05/02/18   Charlton Amor, PA-C    Physical Exam: Vitals:   06/06/18 2246  BP: (!) 134/93  Pulse: (!) 109  Resp: 20  Temp: 100.1 F (37.8 C)  TempSrc: Oral  SpO2: 100%  Weight: 123.8 kg  Height: 5\' 9"  (1.753 m)      Constitutional: Moderately built and nourished. Vitals:   06/06/18 2246  BP: (!) 134/93  Pulse: (!) 109  Resp: 20  Temp: 100.1 F (37.8 C)  TempSrc: Oral  SpO2: 100%  Weight: 123.8 kg  Height: 5\' 9"  (1.753 m)   Eyes: Anicteric no pallor. ENMT: No discharge from the ears eyes nose or mouth. Neck: No neck collar. Respiratory: No rhonchi or crepitations. Cardiovascular: S1-S2  heard no murmurs appreciated. Abdomen: Soft nontender bowel sounds present. Musculoskeletal: Mild edema of the both lower extremities.  More on the left side. Skin: No rash. Neurologic: Alert awake oriented to time place and person.  Difficulty moving both lower extremities has a strength around 2 x 5.  Upper extremities are 5 x 5. Psychiatric: Appears normal per normal affect.   Labs on Admission: I have personally reviewed following labs and imaging studies  CBC: Recent Labs  Lab 06/07/18 0021  WBC 7.7  NEUTROABS 3.9  HGB 9.7*  HCT 32.3*  MCV 92.3  PLT 203   Basic Metabolic Panel: Recent Labs  Lab 06/07/18 0021  NA 137  K 4.0  CL  103  CO2 24  GLUCOSE 91  BUN 9  CREATININE 0.97  CALCIUM 9.1   GFR: Estimated Creatinine Clearance: 115.8 mL/min (by C-G formula based on SCr of 0.97 mg/dL). Liver Function Tests: Recent Labs  Lab 06/07/18 0021  AST 26  ALT 17  ALKPHOS 71  BILITOT 0.8  PROT 6.5  ALBUMIN 2.8*   No results for input(s): LIPASE, AMYLASE in the last 168 hours. No results for input(s): AMMONIA in the last 168 hours. Coagulation Profile: Recent Labs  Lab 06/07/18 0021  INR 1.37   Cardiac Enzymes: No results for input(s): CKTOTAL, CKMB, CKMBINDEX, TROPONINI in the last 168 hours. BNP (last 3 results) No results for input(s): PROBNP in the last 8760 hours. HbA1C: No results for input(s): HGBA1C in the last 72 hours. CBG: No results for input(s): GLUCAP in the last 168 hours. Lipid Profile: No results for input(s): CHOL, HDL, LDLCALC, TRIG, CHOLHDL, LDLDIRECT in the last 72 hours. Thyroid Function Tests: No results for input(s): TSH, T4TOTAL, FREET4, T3FREE, THYROIDAB in the last 72 hours. Anemia Panel: No results for input(s): VITAMINB12, FOLATE, FERRITIN, TIBC, IRON, RETICCTPCT in the last 72 hours. Urine analysis:    Component Value Date/Time   COLORURINE YELLOW 04/23/2018 1859   APPEARANCEUR CLEAR 04/23/2018 1859   LABSPEC 1.024  04/23/2018 1859   PHURINE 5.0 04/23/2018 1859   GLUCOSEU NEGATIVE 04/23/2018 1859   HGBUR SMALL (A) 04/23/2018 1859   BILIRUBINUR NEGATIVE 04/23/2018 1859   KETONESUR NEGATIVE 04/23/2018 1859   PROTEINUR NEGATIVE 04/23/2018 1859   NITRITE NEGATIVE 04/23/2018 1859   LEUKOCYTESUR NEGATIVE 04/23/2018 1859   Sepsis Labs: @LABRCNTIP (procalcitonin:4,lacticidven:4) )No results found for this or any previous visit (from the past 240 hour(s)).   Radiological Exams on Admission: No results found.    Assessment/Plan Principal Problem:   Cord compression [facility]) Active Problems:   Benign essential HTN   History of pulmonary embolism    1. Cord compression with cord edema status post recent ACDF at C3 C5 -we will keep patient n.p.o. in anticipation of procedure.  We will consult Dr. Wynetta Emery neurosurgeon.  On Flexeril and pain relief medications for now. Addendum -discussed with Dr. Wynetta Emery neurosurgeon who advised starting Decadron and will be seen patient.  Not planning for any procedure today so patient can eat.   2. Recent pulmonary embolism and DVT on apixaban.  Has had last dose of apixaban 24 hours ago.  In anticipation of procedure we will keep patient on heparin for now. 3. Fever -patient has been running low-grade fever.  Source not clear.  Good blood cultures.  Has chronic right knee pain and swelling from recent meniscal tear. 4. Hypertension on hydrochlorothiazide. 5. Anemia likely postoperative blood loss follow CBC. 6. Right knee meniscal tear on brace.   DVT prophylaxis: Heparin. Code Status: Full code. Family Communication: Discussed with patient. Disposition Plan: To be determined. Consults called: We will consult neurosurgery. Admission status: Observation.   Eduard Clos MD [facility] Pager [phone].  If 7PM-7AM, please contact night-coverage [email or web address] Password [email or web address]  06/07/2018, 1:59 AM

## 2018-06-07 NOTE — Progress Notes (Signed)
Patient off floor for procedure at 1300

## 2018-06-07 NOTE — Progress Notes (Signed)
ANTICOAGULATION CONSULT NOTE - Initial Consult  Pharmacy Consult for Heparin (Apixaban on hold) Indication: pulmonary embolus  Allergies  Allergen Reactions  . Nsaids Other (See Comments)    G6PD  . Sulfa Antibiotics Other (See Comments)    G6PD    Patient Measurements: Height: 5\' 9"  (175.3 cm) Weight: 272 lb 14.9 oz (123.8 kg) IBW/kg (Calculated) : 70.7  Vital Signs: Temp: 100.1 F (37.8 C) (09/24 2246) Temp Source: Oral (09/24 2246) BP: 134/93 (09/24 2246) Pulse Rate: 109 (09/24 2246)  Labs: Recent Labs    06/07/18 0021  HGB 9.7*  HCT 32.3*  PLT 203  LABPROT 16.7*  INR 1.37  CREATININE 0.97    Estimated Creatinine Clearance: 115.8 mL/min (by C-G formula based on SCr of 0.97 mg/dL).   Medical History: Past Medical History:  Diagnosis Date  . Degenerative disc disease, lumbar   . Gout   . History of pernicious anemia 1980   Patient says he takes OTC iron supplements QD  . Hypertension     Assessment: 52 y/o M with recent hx of bilateral PE/DVT, on apixaban PTA, holding apixaban and starting heparin, last dose apixaban >12 hours ago per pt, OK to start heparin now, Hgb 9.7, plts good, renal function good. Will likely need to use aPTT levels to dose heparin for the next 24-48 hours due to Apixaban influence on heparin levels.   Goal of Therapy:  Heparin level 0.3-0.7 units/ml aPTT 66-102 seconds Monitor platelets by anticoagulation protocol: Yes   Plan:  Start heparin drip at 1500 units/hr 1100 aPTT/HL Daily CBC/HL/aPTT Monitor for bleeding   Abran Duke 06/07/2018,2:28 AM

## 2018-06-08 DIAGNOSIS — R509 Fever, unspecified: Secondary | ICD-10-CM

## 2018-06-08 DIAGNOSIS — M7989 Other specified soft tissue disorders: Secondary | ICD-10-CM

## 2018-06-08 DIAGNOSIS — M25511 Pain in right shoulder: Secondary | ICD-10-CM

## 2018-06-08 DIAGNOSIS — Z86711 Personal history of pulmonary embolism: Secondary | ICD-10-CM

## 2018-06-08 DIAGNOSIS — M25572 Pain in left ankle and joints of left foot: Secondary | ICD-10-CM

## 2018-06-08 DIAGNOSIS — M5136 Other intervertebral disc degeneration, lumbar region: Secondary | ICD-10-CM

## 2018-06-08 DIAGNOSIS — Z882 Allergy status to sulfonamides status: Secondary | ICD-10-CM

## 2018-06-08 DIAGNOSIS — Z886 Allergy status to analgesic agent status: Secondary | ICD-10-CM

## 2018-06-08 DIAGNOSIS — Z86718 Personal history of other venous thrombosis and embolism: Secondary | ICD-10-CM

## 2018-06-08 DIAGNOSIS — M109 Gout, unspecified: Secondary | ICD-10-CM

## 2018-06-08 DIAGNOSIS — M549 Dorsalgia, unspecified: Secondary | ICD-10-CM

## 2018-06-08 DIAGNOSIS — M25512 Pain in left shoulder: Secondary | ICD-10-CM

## 2018-06-08 DIAGNOSIS — M25561 Pain in right knee: Secondary | ICD-10-CM

## 2018-06-08 DIAGNOSIS — Z981 Arthrodesis status: Secondary | ICD-10-CM

## 2018-06-08 DIAGNOSIS — Z7901 Long term (current) use of anticoagulants: Secondary | ICD-10-CM

## 2018-06-08 LAB — CBC
HEMATOCRIT: 28.4 % — AB (ref 39.0–52.0)
HEMOGLOBIN: 8.6 g/dL — AB (ref 13.0–17.0)
MCH: 27.5 pg (ref 26.0–34.0)
MCHC: 30.3 g/dL (ref 30.0–36.0)
MCV: 90.7 fL (ref 78.0–100.0)
Platelets: 256 10*3/uL (ref 150–400)
RBC: 3.13 MIL/uL — ABNORMAL LOW (ref 4.22–5.81)
RDW: 14.4 % (ref 11.5–15.5)
WBC: 10.1 10*3/uL (ref 4.0–10.5)

## 2018-06-08 LAB — HEPARIN LEVEL (UNFRACTIONATED): HEPARIN UNFRACTIONATED: 0.88 [IU]/mL — AB (ref 0.30–0.70)

## 2018-06-08 LAB — APTT: aPTT: 77 seconds — ABNORMAL HIGH (ref 24–36)

## 2018-06-08 LAB — URINE CULTURE: Culture: <10000 — AB

## 2018-06-08 MED ORDER — METHOCARBAMOL 500 MG PO TABS
500.0000 mg | ORAL_TABLET | Freq: Four times a day (QID) | ORAL | Status: DC | PRN
  Administered 2018-06-08 – 2018-06-21 (×12): 500 mg via ORAL
  Filled 2018-06-08 (×11): qty 1

## 2018-06-08 MED ORDER — GABAPENTIN 300 MG PO CAPS
300.0000 mg | ORAL_CAPSULE | Freq: Three times a day (TID) | ORAL | Status: DC
  Administered 2018-06-08 – 2018-08-30 (×238): 300 mg via ORAL
  Filled 2018-06-08 (×240): qty 1

## 2018-06-08 MED ORDER — DEXAMETHASONE SODIUM PHOSPHATE 10 MG/ML IJ SOLN
10.0000 mg | Freq: Four times a day (QID) | INTRAMUSCULAR | Status: AC
  Administered 2018-06-08 – 2018-06-09 (×4): 10 mg via INTRAVENOUS
  Filled 2018-06-08 (×4): qty 1

## 2018-06-08 MED ORDER — OXYCODONE HCL 5 MG PO TABS
15.0000 mg | ORAL_TABLET | ORAL | Status: DC | PRN
  Administered 2018-06-08 – 2018-06-13 (×9): 15 mg via ORAL
  Filled 2018-06-08 (×9): qty 3

## 2018-06-08 MED ORDER — PANTOPRAZOLE SODIUM 40 MG PO TBEC
40.0000 mg | DELAYED_RELEASE_TABLET | Freq: Two times a day (BID) | ORAL | Status: DC
  Administered 2018-06-08 – 2018-06-22 (×10): 40 mg via ORAL
  Filled 2018-06-08 (×19): qty 1

## 2018-06-08 NOTE — Progress Notes (Signed)
Patient Demographics:    [name] [name], is a 52 y.o. male, DOB - 19-Oct-1965, NGE:[id number]  Admit date - 06/06/2018   Admitting Physician Rise Patience, MD  Outpatient Primary MD for the patient is Helane Rima, MD  LOS - 1   No chief complaint on file.       Subjective:    [name] [name] today has no fevers, no emesis,  No chest pain, complains of polymyalgia/polyarthralgia,  T-max 101.5, chills and diaphoresis  Assessment  & Plan :    Principal Problem:   Fever Active Problems:   Benign essential HTN   Cord compression (HCC)   History of pulmonary embolism  Brief Summary:- 51 y.o.malewithhistory of hypertension who has had a recent ACDF at C3- C5 by Dr. Saintclair Halsted in July 4010 complicated by pulmonary embolism was placed on apixaban subsequently discharged to rehab and patient had moved to [place] area to [place], currently went home to live with his parents.  Readmitted on 06/07/18 with concerns about persistent low-grade fevers and chills over the last couple of months ESR elevated above 140,  WBC 8.1,  CRP 20.4   Plan:- 1) persistent fevers and elevated inflammatory markers--- T-max 101.5, ESR over 140, CRP over 20, no leukocytosis, infectious versus inflammatory etiology, urine and blood cultures are negative so far, chest x-ray without acute findings, CT and MRI of the cervical and lumbar spine with postop changes but no definite evidence for discitis or epidural abscess.  Infectious disease consult/input appreciated, x-rays of both knees reveals mild effusion on the right knee otherwise no acute findings , Bil DJD noted  2)S/p ACDF at C3- C5  On 03/24/18--- Dr Saintclair Halsted--- as per neurosurgical attending  Dr. Saintclair Halsted, imaging studies including cervical and lumbar MRI studies and clinical exam does not support any acute postop complications from his recent neurosurgical procedure,  3)Bil  Knee Pain/Swelling- Rt > Lt-- ????  Infectious versus inflammatory arthritis of the right knee--- RT Knee aspiration by orthopedic team pending, will check  cell count, Gram stain, culture, crystals  Disposition/Need for in-Hospital Stay- patient unable to be discharged at this time due to fevers, elevated inflammatory markers etiologies/definitive diagnosis pending cultures and other work-up, significant polymyalgia and polyarthralgia with inability to perform mobility related activities of daily living   Code Status : Full Code  Disposition Plan  : TBD  Consults  :  Ortho/ID/Neurosursery  DVT Prophylaxis  : IV heparin due to history of PE/previously on apixaban prior to admission  Lab Results  Component Value Date   PLT 256 06/08/2018    Inpatient Medications  Scheduled Meds: . dexamethasone  10 mg Intravenous Q6H  . gabapentin  300 mg Oral TID  . hydrochlorothiazide  12.5 mg Oral Daily  . pantoprazole  40 mg Oral BID   Continuous Infusions: . heparin 1,500 Units/hr (06/07/18 2341)  . methocarbamol (ROBAXIN) IV 500 mg (06/07/18 2341)   PRN Meds:.acetaminophen **OR** acetaminophen, methocarbamol (ROBAXIN) IV, methocarbamol, morphine injection, ondansetron **OR** ondansetron (ZOFRAN) IV, oxyCODONE    Anti-infectives (From admission, onward)   None        Objective:   Vitals:   06/07/18 2307 06/08/18 0321 06/08/18 0725 06/08/18 1223  BP: (!) 137/100 (!) 133/91 (!) 138/110 131/84  Pulse:  100 (!) 107 84  Resp: '18 18 18 20  ' Temp: 98.3 F (36.8 C) 98 F (36.7 C) (!) 100.7 F (38.2 C) 98.3 F (36.8 C)  TempSrc: Oral Oral Oral Oral  SpO2: 97% 98% 98% 98%  Weight:      Height:        Wt Readings from Last 3 Encounters:  06/06/18 123.8 kg  05/26/18 123.8 kg  04/26/18 118 kg     Intake/Output Summary (Last 24 hours) at 06/08/2018 1420 Last data filed at 06/08/2018 0400 Gross per 24 hour  Intake 335 ml  Output 600 ml  Net -265 ml     Physical Exam Patient  is examined daily including today on 06/08/18 , exams remain the same as of yesterday except that has changed   Gen:- Awake Alert, in no apparent distress  HEENT:- [place].AT, No sclera icterus Neck-Supple Neck,No JVD,.  Lungs-  CTAB , good air movement bilaterally CV- S1, S2 normal, regular Abd-  +ve B.Sounds, Abd Soft, No tenderness,    Extremity/Skin:-Bilateral knee swelling right more than left, reduced range of motion of both knees due to pain, no significant erythema or streaking Psych-affect is appropriate, oriented x3 Neuro-no new focal deficits, no tremors   Data Review:   Micro Results Recent Results (from the past 240 hour(s))  Culture, blood (routine x 2)     Status: None (Preliminary result)   Collection Time: 06/07/18 12:23 AM  Result Value Ref Range Status   Specimen Description BLOOD RIGHT ANTECUBITAL  Final   Special Requests   Final    BOTTLES DRAWN AEROBIC ONLY Blood Culture results may not be optimal due to an inadequate volume of blood received in culture bottles   Culture   Final    NO GROWTH 1 DAY Performed at [facility], [place] [address], [place], [place] [address]    Report Status PENDING  Incomplete  Culture, blood (routine x 2)     Status: None (Preliminary result)   Collection Time: 06/07/18 12:33 AM  Result Value Ref Range Status   Specimen Description BLOOD RIGHT HAND  Final   Special Requests   Final    BOTTLES DRAWN AEROBIC ONLY Blood Culture results may not be optimal due to an inadequate volume of blood received in culture bottles   Culture   Final    NO GROWTH 1 DAY Performed at [facility], [place] [address] [place], [place] [address]    Report Status PENDING  Incomplete  Culture, Urine     Status: Abnormal   Collection Time: 06/07/18  8:34 AM  Result Value Ref Range Status   Specimen Description URINE, CATHETERIZED  Final   Special Requests NONE  Final   Culture (A)  Final    <10,000 COLONIES/mL INSIGNIFICANT GROWTH Performed at  [facility] [address], [place], [place] [address]    Report Status 06/08/2018 FINAL  Final    Radiology Reports Ct Cervical Spine Wo Contrast  Result Date: 06/07/2018 CLINICAL DATA:  Postoperative headache. EXAM: CT CERVICAL SPINE WITHOUT CONTRAST TECHNIQUE: Multidetector CT imaging of the cervical spine was performed without intravenous contrast. Multiplanar CT image reconstructions were also generated. COMPARISON:  MRI cervical spine June 07, 2018 and CT cervical spine March 22, 2018 and CT chest March 27, 2018 FINDINGS: ALIGNMENT: Straightened lordosis.  Vertebral bodies in alignment. SKULL BASE AND VERTEBRAE: Status post C4 corpectomy with interbody cage, C3 through C5 anterior plate and screw fixation, intact well-seated  hardware. 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 No fracture. Minimal C5-6 disc height loss and endplate spurring compatible with degenerative disc. No destructive bony lesions. Moderate LEFT C4-5 facet arthropathy. SOFT TISSUES AND SPINAL CANAL: Prevertebral soft tissue swelling. DISC LEVELS: Similar severe canal stenosis C3-4 and C4-5 due to ossified posterior longitudinal ligament. Mild canal stenosis C5-6. Severe LEFT C3-4 and LEFT C4-5 neural foraminal narrowing. Moderate bilateral C5-6 neural foraminal narrowing. UPPER CHEST: Lung apices are clear. Redemonstration of aneurysmal aorta. OTHER: None. IMPRESSION: 1. Status post C3 through C5 ACDF, C4 corpectomy without CT findings of hardware failure. Persistent prevertebral soft tissue swelling. 2. 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 3. Severe C3-4 and C4-5 neural foraminal narrowing. Electronically Signed   By: Elon Alas M.D.   On: 06/07/2018 15:55   Mr Cervical Spine W Wo Contrast  Result Date: 06/07/2018 CLINICAL DATA:  Bilateral lower extremity weakness and spasms.  History of 0PLL. Status post ACDF and C4 corpectomy March 24, 2018. EXAM: MRI CERVICAL AND LUMBAR SPINE WITHOUT AND WITH CONTRAST TECHNIQUE: Multiplanar and multiecho pulse sequences of the cervical spine, to include the craniocervical junction and cervicothoracic junction, and lumbar spine, were obtained without and with intravenous contrast. CONTRAST:  10 cc Gadavist COMPARISON:  MRI cervical and lumbar spine March 22, 2018. FINDINGS: MRI CERVICAL SPINE FINDINGS ALIGNMENT: Straightened cervical lordosis.  No malalignment. VERTEBRAE/DISCS: Status post C4 corpectomy and C5-C6 ACDF resulting in hardware artifact. Nonsurgically altered disc heights in signal generally preserved. No suspicious osseous or disc enhancement. CORD:Expansile T2 bright signal cervical spinal cord from C3-4 to C5-6 with superimposed 7 mm segment of the spinal cord enhancement at C5-6. No syrinx. No abnormal leptomeningeal or epidural enhancement. POSTERIOR FOSSA, VERTEBRAL ARTERIES, PARASPINAL TISSUES: No MR findings of ligamentous injury. Vertebral artery flow voids present. Prevertebral soft tissue thickening and edema at C2-3 measuring to 15 mm in AP dimension with homogeneous enhancement. DISC LEVELS: C2-3: LEFT central 5 x 7 mm (transverse by AP) low signal disc osteophyte complex/0PLL with contiguous bone marrow signal extending to C4-5. Displacement of spinal cord toward the RIGHT. Mild canal stenosis. Mild facet arthropathy. No neural foraminal narrowing. C3-4: LEFT disc osteophyte complex/0PLL, moderate canal stenosis with displacement of the spinal cord towards the RIGHT. Uncovertebral hypertrophy and mild facet arthropathy. Mild RIGHT and moderate LEFT neural foraminal narrowing suspected. C4-5: Broad-based disc osteophyte complex, uncovertebral hypertrophy resulting in severe canal stenosis, AP dimension of the canal is 6 mm. Moderate RIGHT and moderate to severe LEFT neural foraminal narrowing. C5-6: Small broad-based disc bulge,  uncovertebral hypertrophy. Moderate canal stenosis, AP dimension of the canal is 8 mm. Moderate to severe neural foraminal narrowing. C6-7: Uncovertebral hypertrophy without canal stenosis. Mild neural foraminal narrowing. C7-T1: No disc bulge, canal stenosis nor neural foraminal narrowing. MRI LUMBAR SPINE FINDINGS SEGMENTATION: For the purposes of this report, the last well-formed intervertebral disc is reported as L5-S1. ALIGNMENT: Maintained lumbar lordosis. No malalignment. VERTEBRAE:Vertebral bodies are intact. Moderate C3-4 I moderate to severe C4-5 and C5-6 disc height loss is similar. Severe subacute discogenic endplate changes Y4-0 H4-7, mild at C5-6. Acute L4-5 Schmorl's nodes with edema and enhancement. Mild edema and disc enhancement C5-6 without suspicious features. CONUS MEDULLARIS AND CAUDA EQUINA: Conus medullaris terminates at L1-2 and demonstrates normal morphology and signal characteristics. Cauda equina is normal. No abnormal cord, leptomeningeal or epidural enhancement. PARASPINAL AND OTHER  SOFT TISSUES: Bright interstitial STIR signal and enhancement within paraspinal muscles L1-2 through L5-S1. No focal fluid collection. Mild interstitial STIR signal enhancement L2-3 interspinous space seen with Baastrup's disease or injury. DISC LEVELS: L1-2: No disc bulge, canal stenosis nor neural foraminal narrowing. L2-3: Small similar RIGHT subarticular disc protrusion. Moderate to severe facet arthropathy and ligamentum flavum redundancy without canal stenosis. Mild-to-moderate RIGHT neural foraminal narrowing. L3-4: Similar small broad-based disc bulge eccentric laterally with enhancing LEFT extraforaminal enhancing annular fissure. Moderate facet arthropathy and ligamentum flavum redundancy without canal stenosis. Moderate LEFT neural foraminal narrowing and mild probable exited LEFT L3 nerve impingement. L4-5: Similar moderate broad-based disc bulge. Moderate facet arthropathy and ligamentum flavum  redundancy. Mild canal stenosis. Mild bilateral neural foraminal narrowing. Similar exited RIGHT L4 nerve impingement. L5-S1: Similar small broad-based disc bulge. Severe RIGHT and moderate LEFT facet arthropathy. No canal stenosis. Minimal neural foraminal narrowing. IMPRESSION: MRI cervical spine: 1. Status post C3 through C5 ACDF, C4 corpectomy. Spinal cord edema C3-4 to C5-6 with 7 mm segment of spinal cord enhancement at C5-6 seen with subacute infarct, less likely demyelination. 2. Severe canal stenosis C4-5, moderate canal stenosis C3-4 and C5-6. 3. Neural foraminal narrowing C3-4 through C7-T1: Moderate to severe at C4-5 and C5-6 that may be overestimated by hardware artifact. MRI lumbar spine: 1. Similar degenerative change of the lumbar spine, acute L4-5 Schmorl's nodes. No fracture or malalignment. 2. Paraspinal muscle strain, less likely myositis. L2-3 interspinous injury versus early Baastrup's. 3. Mild canal stenosis L4-5. 4. Neural foraminal narrowing L2-3 through L5-S1: Moderate on the LEFT at L3-4. Probable exited LEFT L3 and similar exited RIGHT L4 nerve impingement. Electronically Signed   By: Elon Alas M.D.   On: 06/07/2018 14:50   Mr Lumbar Spine W Wo Contrast  Result Date: 06/07/2018 CLINICAL DATA:  Bilateral lower extremity weakness and spasms. History of 0PLL. Status post ACDF and C4 corpectomy March 24, 2018. EXAM: MRI CERVICAL AND LUMBAR SPINE WITHOUT AND WITH CONTRAST TECHNIQUE: Multiplanar and multiecho pulse sequences of the cervical spine, to include the craniocervical junction and cervicothoracic junction, and lumbar spine, were obtained without and with intravenous contrast. CONTRAST:  10 cc Gadavist COMPARISON:  MRI cervical and lumbar spine March 22, 2018. FINDINGS: MRI CERVICAL SPINE FINDINGS ALIGNMENT: Straightened cervical lordosis.  No malalignment. VERTEBRAE/DISCS: Status post C4 corpectomy and C5-C6 ACDF resulting in hardware artifact. Nonsurgically altered disc  heights in signal generally preserved. No suspicious osseous or disc enhancement. CORD:Expansile T2 bright signal cervical spinal cord from C3-4 to C5-6 with superimposed 7 mm segment of the spinal cord enhancement at C5-6. No syrinx. No abnormal leptomeningeal or epidural enhancement. POSTERIOR FOSSA, VERTEBRAL ARTERIES, PARASPINAL TISSUES: No MR findings of ligamentous injury. Vertebral artery flow voids present. Prevertebral soft tissue thickening and edema at C2-3 measuring to 15 mm in AP dimension with homogeneous enhancement. DISC LEVELS: C2-3: LEFT central 5 x 7 mm (transverse by AP) low signal disc osteophyte complex/0PLL with contiguous bone marrow signal extending to C4-5. Displacement of spinal cord toward the RIGHT. Mild canal stenosis. Mild facet arthropathy. No neural foraminal narrowing. C3-4: LEFT disc osteophyte complex/0PLL, moderate canal stenosis with displacement of the spinal cord towards the RIGHT. Uncovertebral hypertrophy and mild facet arthropathy. Mild RIGHT and moderate LEFT neural foraminal narrowing suspected. C4-5: Broad-based disc osteophyte complex, uncovertebral hypertrophy resulting in severe canal stenosis, AP dimension of the canal is 6 mm. Moderate RIGHT and moderate to severe LEFT neural foraminal narrowing. C5-6: Small broad-based disc bulge, uncovertebral hypertrophy. Moderate  canal stenosis, AP dimension of the canal is 8 mm. Moderate to severe neural foraminal narrowing. C6-7: Uncovertebral hypertrophy without canal stenosis. Mild neural foraminal narrowing. C7-T1: No disc bulge, canal stenosis nor neural foraminal narrowing. MRI LUMBAR SPINE FINDINGS SEGMENTATION: For the purposes of this report, the last well-formed intervertebral disc is reported as L5-S1. ALIGNMENT: Maintained lumbar lordosis. No malalignment. VERTEBRAE:Vertebral bodies are intact. Moderate C3-4 I moderate to severe C4-5 and C5-6 disc height loss is similar. Severe subacute discogenic endplate changes  M0-7 W8-0, mild at C5-6. Acute L4-5 Schmorl's nodes with edema and enhancement. Mild edema and disc enhancement C5-6 without suspicious features. CONUS MEDULLARIS AND CAUDA EQUINA: Conus medullaris terminates at L1-2 and demonstrates normal morphology and signal characteristics. Cauda equina is normal. No abnormal cord, leptomeningeal or epidural enhancement. PARASPINAL AND OTHER SOFT TISSUES: Bright interstitial STIR signal and enhancement within paraspinal muscles L1-2 through L5-S1. No focal fluid collection. Mild interstitial STIR signal enhancement L2-3 interspinous space seen with Baastrup's disease or injury. DISC LEVELS: L1-2: No disc bulge, canal stenosis nor neural foraminal narrowing. L2-3: Small similar RIGHT subarticular disc protrusion. Moderate to severe facet arthropathy and ligamentum flavum redundancy without canal stenosis. Mild-to-moderate RIGHT neural foraminal narrowing. L3-4: Similar small broad-based disc bulge eccentric laterally with enhancing LEFT extraforaminal enhancing annular fissure. Moderate facet arthropathy and ligamentum flavum redundancy without canal stenosis. Moderate LEFT neural foraminal narrowing and mild probable exited LEFT L3 nerve impingement. L4-5: Similar moderate broad-based disc bulge. Moderate facet arthropathy and ligamentum flavum redundancy. Mild canal stenosis. Mild bilateral neural foraminal narrowing. Similar exited RIGHT L4 nerve impingement. L5-S1: Similar small broad-based disc bulge. Severe RIGHT and moderate LEFT facet arthropathy. No canal stenosis. Minimal neural foraminal narrowing. IMPRESSION: MRI cervical spine: 1. Status post C3 through C5 ACDF, C4 corpectomy. Spinal cord edema C3-4 to C5-6 with 7 mm segment of spinal cord enhancement at C5-6 seen with subacute infarct, less likely demyelination. 2. Severe canal stenosis C4-5, moderate canal stenosis C3-4 and C5-6. 3. Neural foraminal narrowing C3-4 through C7-T1: Moderate to severe at C4-5 and C5-6  that may be overestimated by hardware artifact. MRI lumbar spine: 1. Similar degenerative change of the lumbar spine, acute L4-5 Schmorl's nodes. No fracture or malalignment. 2. Paraspinal muscle strain, less likely myositis. L2-3 interspinous injury versus early Baastrup's. 3. Mild canal stenosis L4-5. 4. Neural foraminal narrowing L2-3 through L5-S1: Moderate on the LEFT at L3-4. Probable exited LEFT L3 and similar exited RIGHT L4 nerve impingement. Electronically Signed   By: Elon Alas M.D.   On: 06/07/2018 14:50   US Venous Img Lower Bilateral  Result Date: 06/01/2018 CLINICAL DATA:  52 year old male with a history of prior DVT, cervical spine surgery and local hemorrhage on blood thinners. Treated with caval interruption 03/28/2018. EXAM: BILATERAL LOWER EXTREMITY VENOUS DOPPLER ULTRASOUND TECHNIQUE: Gray-scale sonography with graded compression, as well as color Doppler and duplex ultrasound were performed to evaluate the lower extremity deep venous systems from the level of the common femoral vein and including the common femoral, femoral, profunda femoral, popliteal and calf veins including the posterior tibial, peroneal and gastrocnemius veins when visible. The superficial great saphenous vein was also interrogated. Spectral Doppler was utilized to evaluate flow at rest and with distal augmentation maneuvers in the common femoral, femoral and popliteal veins. COMPARISON:  None. FINDINGS: RIGHT LOWER EXTREMITY Common Femoral Vein: No evidence of thrombus. Normal compressibility, respiratory phasicity and response to augmentation. Saphenofemoral Junction: No evidence of thrombus. Normal compressibility and flow on color Doppler imaging. Profunda  Femoral Vein: No evidence of thrombus. Normal compressibility and flow on color Doppler imaging. Femoral Vein: No evidence of thrombus. Normal compressibility, respiratory phasicity and response to augmentation. Popliteal Vein: No evidence of thrombus.  Normal compressibility, respiratory phasicity and response to augmentation. Calf Veins: Peroneal and posterior tibial veins demonstrate regions of wall thickening and non compressibility with flow maintained, compatible with chronic thrombus. Superficial Great Saphenous Vein: No evidence of thrombus. Normal compressibility and flow on color Doppler imaging. Other Findings:  None. LEFT LOWER EXTREMITY Common Femoral Vein: No evidence of thrombus. Normal compressibility, respiratory phasicity and response to augmentation. Saphenofemoral Junction: No evidence of thrombus. Normal compressibility and flow on color Doppler imaging. Profunda Femoral Vein: No evidence of thrombus. Normal compressibility and flow on color Doppler imaging. Femoral Vein: No evidence of thrombus. Normal compressibility, respiratory phasicity and response to augmentation. Popliteal Vein: No evidence of thrombus. Normal compressibility, respiratory phasicity and response to augmentation. Calf Veins: Peroneal and posterior tibial veins demonstrate regions of wall thickening and in compressibility with flow maintained compatible with chronic thrombus. Superficial Great Saphenous Vein: No evidence of thrombus. Normal compressibility and flow on color Doppler imaging. Other Findings:  None. IMPRESSION: Sonographic survey of the bilateral lower extremities negative for acute DVT. 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 Electronically Signed   By: Corrie Mckusick D.O.   On: 06/01/2018 07:56   Ir Radiologist Eval & Mgmt  Result Date: 06/01/2018 Please refer to notes tab for details about interventional procedure. (Op Note)  Dg Knee 3 Views Left  Result Date: 06/07/2018 CLINICAL DATA:  Left knee pain for several weeks without known injury. EXAM: LEFT KNEE - 3 VIEW COMPARISON:  None. FINDINGS: No evidence of fracture, dislocation, or joint effusion. Moderate narrowing of medial joint space is noted. Soft tissues are unremarkable.  IMPRESSION: Moderate degenerative joint disease is noted medially. No acute abnormality seen in the left knee. Electronically Signed   By: Marijo Conception, M.D.   On: 06/07/2018 14:30   Dg Knee 3 View Right  Result Date: 06/07/2018 CLINICAL DATA:  Right knee pain for several weeks without known injury. EXAM: RIGHT KNEE - 3 VIEW COMPARISON:  None. FINDINGS: No fracture or dislocation is noted. Mild suprapatellar joint effusion is noted. Mild patellar spurring is noted. Moderate narrowing of medial joint space is noted with osteophyte formation. IMPRESSION: Mild suprapatellar joint effusion. Moderate degenerative joint disease is noted medially. No fracture or dislocation is noted. Electronically Signed   By: Marijo Conception, M.D.   On: 06/07/2018 14:31     CBC Recent Labs  Lab 06/07/18 0021 06/07/18 0408 06/08/18 0924  WBC 7.7 8.1 10.1  HGB 9.7* 8.4* 8.6*  HCT 32.3* 27.2* 28.4*  PLT 203 208 256  MCV 92.3 91.9 90.7  MCH 27.7 28.4 27.5  MCHC 30.0 30.9 30.3  RDW 14.1 14.2 14.4  LYMPHSABS 2.7  --   --   MONOABS 0.9  --   --   EOSABS 0.1  --   --   BASOSABS 0.0  --   --     Chemistries  Recent Labs  Lab 06/07/18 0021 06/07/18 0408  NA 137 137  K 4.0 3.7  CL 103 102  CO2 24 23  GLUCOSE 91 89  BUN 9 9  CREATININE 0.97 0.97  CALCIUM 9.1 8.9  AST 26  --   ALT 17  --   ALKPHOS 71  --   BILITOT 0.8  --    ------------------------------------------------------------------------------------------------------------------  No results for input(s): CHOL, HDL, LDLCALC, TRIG, CHOLHDL, LDLDIRECT in the last 72 hours.  No results found for: HGBA1C ------------------------------------------------------------------------------------------------------------------ No results for input(s): TSH, T4TOTAL, T3FREE, THYROIDAB in the last 72 hours.  Invalid input(s): FREET3 ------------------------------------------------------------------------------------------------------------------ No  results for input(s): VITAMINB12, FOLATE, FERRITIN, TIBC, IRON, RETICCTPCT in the last 72 hours.  Coagulation profile Recent Labs  Lab 06/07/18 0021  INR 1.37    No results for input(s): DDIMER in the last 72 hours.  Cardiac Enzymes No results for input(s): CKMB, TROPONINI, MYOGLOBIN in the last 168 hours.  Invalid input(s): CK ------------------------------------------------------------------------------------------------------------------ No results found for: BNP   Roxan Hockey M.D on 06/08/2018 at 2:20 PM  Pager---[phone] Go to [email or web address] - password [email or web address] for contact info  [facility] - Office  [phone]

## 2018-06-08 NOTE — Consult Note (Signed)
[facility]    Date of Admission:  06/06/2018     Total days of antibiotics                Reason for Consult: Fever   Referring Provider: Denton Brick Primary Care Provider: Helane Rima, MD   Assessment/Plan:  Mr. Salvia is a 52 y/o male presenting with chronic, low grade fevers since his most recent ACDF neck surgery in July of 2019. Now with new onset arthralgias. Inflammatory markers are significantly elevated with concern for inflammatory/autoimmune process or possible infection. Imaging has not found any source of abscess, discitis, or osteomyelitis in the spine. He does have new onset bilateral shoulder pain in the absence trauma or injury. Blood cultures are without growth to date. He has not been on antibiotics recently. This is favoring a more inflammatory condition than infection although it cannot be ruled out. He is on anticoagulation in the setting of previous DVT and PE. HIV was negative.   1. Continue to monitor off antibiotics at this time. 2. Check ANA, ANCA, and repeat blood cultures. 3. May benefit from Rheumatology evaluation.    Principal Problem:   Fever Active Problems:   Benign essential HTN   Cord compression (HCC)   History of pulmonary embolism   . dexamethasone  10 mg Intravenous Q6H  . gabapentin  300 mg Oral TID  . hydrochlorothiazide  12.5 mg Oral Daily  . pantoprazole  40 mg Oral BID     HPI: Hussain Maimone is a 52 y.o. male with a previous medical history of degenerative disc disease, gout, and recent ACDF in July 9292  complicated by pulmonary embolism who was admitted with the chief complaint of fever going on for the past 2 months. He had been seen in the ED on 8/27 for weakness and possible pneumonia for which he completed a course of antibiotics. He was transferred to [facility] where his chest x-ray and UA were reported to be unremarkable. Blood cultures were obtained with no results  available. MRI of the spine did now show evidence of hemorrhage, abscess or fluid collection.   He has been febrile over the past 48 hours with temperature max of 101.5. Blood cultures are without growth in 1 day. Urine culture is without significant growth. Inflammatory markers on admission were significantly elevated with CRP of 20.4 and ESR >140. MRI of the cervical spine and lumbar spines without evidence of focal fluid collection or obvious discitis/osteomyelitis. HIV was non reactive.   Mr. Fodor continues to have arthralgias located in his bilateral ankles, shoulders and new bilateral anterior shoulder pain. He has not had any injury or trauma to either his ankles or his shoulders. Continues to have cramping in his back. Describes 2 episodes of falls previously and has concerns regarding his blood clotting as he had previous surgeries with no problems and during his ACDF had problems clotting and developed DVT and PE following surgery. He has frustration as he would like to know what is going on with him.  Has not traveled or been around anyone sick. This has been going on since his most recent hospitalization of 42 days back in July. Denies abdominal pain, nausea, vomiting, diarrhea, constipation, chest pain, shortness of breath, dysuria, urinary frequency or urinary urgency.  Review of Systems: Review of Systems  Constitutional: Positive for chills, diaphoresis and fever. Negative for malaise/fatigue and weight loss.  Respiratory: Negative for cough, sputum production, shortness  of breath and wheezing.   Cardiovascular: Negative for chest pain and leg swelling.  Gastrointestinal: Negative for abdominal pain, constipation, diarrhea, nausea and vomiting.  Genitourinary: Negative for dysuria, flank pain, frequency and urgency.  Musculoskeletal: Positive for back pain, falls, joint pain (shoulders, knees, and ankles) and neck pain.     Past Medical History:  Diagnosis Date  . Degenerative  disc disease, lumbar   . Gout   . History of pernicious anemia 1980   Patient says he takes OTC iron supplements QD  . Hypertension     Social History   Tobacco Use  . Smoking status: Never Smoker  . Smokeless tobacco: Never Used  Substance Use Topics  . Alcohol use: Never    Frequency: Never  . Drug use: Never    Family History  Family history unknown: Yes    Allergies  Allergen Reactions  . Nsaids Other (See Comments)    G6PD  . Sulfa Antibiotics Other (See Comments)    G6PD    OBJECTIVE: Blood pressure (!) 138/110, pulse (!) 107, temperature (!) 100.7 F (38.2 C), temperature source Oral, resp. rate 18, height '5\' 9"'  (1.753 m), weight 123.8 kg, SpO2 98 %.  Physical Exam  Constitutional: He is oriented to person, place, and time. He appears well-developed and well-nourished. No distress.  Lying in bed with head of bed elevated; appears uncomfortable; pleasant  Cardiovascular: Normal rate, regular rhythm, normal heart sounds and intact distal pulses. Exam reveals no gallop and no friction rub.  No murmur heard. Pulmonary/Chest: Effort normal and breath sounds normal. No stridor. No respiratory distress. He has no wheezes. He has no rales. He exhibits no tenderness.  Musculoskeletal:  Bilateral shoulders with no obvious deformity or discoloration. There is significant hypersensitivity to the touch located over the anterior aspect of both shoulders in the area of the biceps tendons.  Bilateral knees appear with edema and without tenderness. Decreased range of motion secondary to back pain. Distal pulses and sensation remain intact and appropriate.   Neurological: He is alert and oriented to person, place, and time.  Skin: Skin is warm. He is diaphoretic.  Psychiatric: He has a normal mood and affect. His behavior is normal. Judgment and thought content normal.    Lab Results Lab Results  Component Value Date   WBC 10.1 06/08/2018   HGB 8.6 (L) 06/08/2018   HCT 28.4  (L) 06/08/2018   MCV 90.7 06/08/2018   PLT 256 06/08/2018    Lab Results  Component Value Date   CREATININE 0.97 06/07/2018   BUN 9 06/07/2018   NA 137 06/07/2018   K 3.7 06/07/2018   CL 102 06/07/2018   CO2 23 06/07/2018    Lab Results  Component Value Date   ALT 17 06/07/2018   AST 26 06/07/2018   ALKPHOS 71 06/07/2018   BILITOT 0.8 06/07/2018     Microbiology: Recent Results (from the past 240 hour(s))  Culture, blood (routine x 2)     Status: None (Preliminary result)   Collection Time: 06/07/18 12:23 AM  Result Value Ref Range Status   Specimen Description BLOOD RIGHT ANTECUBITAL  Final   Special Requests   Final    BOTTLES DRAWN AEROBIC ONLY Blood Culture results may not be optimal due to an inadequate volume of blood received in culture bottles   Culture   Final    NO GROWTH 1 DAY Performed at [facility] [address] [place], [place] [address]  Report Status PENDING  Incomplete  Culture, blood (routine x 2)     Status: None (Preliminary result)   Collection Time: 06/07/18 12:33 AM  Result Value Ref Range Status   Specimen Description BLOOD RIGHT HAND  Final   Special Requests   Final    BOTTLES DRAWN AEROBIC ONLY Blood Culture results may not be optimal due to an inadequate volume of blood received in culture bottles   Culture   Final    NO GROWTH 1 DAY Performed at [facility], [place] [address], [place], [place] [address]    Report Status PENDING  Incomplete  Culture, Urine     Status: Abnormal   Collection Time: 06/07/18  8:34 AM  Result Value Ref Range Status   Specimen Description URINE, CATHETERIZED  Final   Special Requests NONE  Final   Culture (A)  Final    <10,000 COLONIES/mL INSIGNIFICANT GROWTH Performed at [facility] [address], [place], [place] [address]    Report Status 06/08/2018 FINAL  Final     Terri Piedra, NP [facility] [phone]  Pager  06/08/2018  11:18 AM

## 2018-06-08 NOTE — Progress Notes (Signed)
ANTICOAGULATION CONSULT NOTE  Pharmacy Consult for Heparin (Apixaban on hold) Indication: pulmonary embolus  Allergies  Allergen Reactions  . Nsaids Other (See Comments)    G6PD  . Sulfa Antibiotics Other (See Comments)    G6PD    Patient Measurements: Height: 5\' 9"  (175.3 cm) Weight: 272 lb 14.9 oz (123.8 kg) IBW/kg (Calculated) : 70.7  Vital Signs: Temp: 100.7 F (38.2 C) (09/26 0725) Temp Source: Oral (09/26 0725) BP: 138/110 (09/26 0725) Pulse Rate: 107 (09/26 0725)  Labs: Recent Labs    06/07/18 0021 06/07/18 0408 06/07/18 1029 06/08/18 0924  HGB 9.7* 8.4*  --  8.6*  HCT 32.3* 27.2*  --  28.4*  PLT 203 208  --  256  APTT  --   --  83* 77*  LABPROT 16.7*  --   --   --   INR 1.37  --   --   --   HEPARINUNFRC  --   --  1.02* 0.88*  CREATININE 0.97 0.97  --   --     Estimated Creatinine Clearance: 115.8 mL/min (by C-G formula based on SCr of 0.97 mg/dL).     Assessment: 52 y/o M with recent hx of bilateral PE/DVT, on apixaban PTA, holding apixaban and starting heparin, last dose apixaban >12 hours ago per pt  PTT therapeutic CBC stable  Goal of Therapy:  Heparin level 0.3-0.7 units/ml aPTT 66-102 seconds Monitor platelets by anticoagulation protocol: Yes   Plan:  Continue heparin drip at 1500 units / hr Daily CBC/HL/aPTT Monitor for bleeding  Thank you Okey Regal, PharmD [phone]  06/08/2018,10:50 AM

## 2018-06-08 NOTE — Progress Notes (Signed)
Subjective: Patient reports Patient doing better today but had a rough day yesterday.  Seems to now be complaining of pain in all of his joints both shoulders left greater than right both elbows both hands hips knees and ankles.  Objective: Vital signs in last 24 hours: Temp:  [98 F (36.7 C)-101.5 F (38.6 C)] 100.7 F (38.2 C) (09/26 0725) Pulse Rate:  [100-117] 107 (09/26 0725) Resp:  [18] 18 (09/26 0725) BP: (132-150)/(91-110) 138/110 (09/26 0725) SpO2:  [94 %-98 %] 98 % (09/26 0725)  Intake/Output from previous day: 09/25 0701 - 09/26 0700 In: 335 [P.O.:100; I.V.:135; IV Piggyback:100] Out: 600 [Urine:600] Intake/Output this shift: No intake/output data recorded.  Neurologic exam overall appears to be stable significant pain limited exam lower extremities has 5 out of 5 dorsiflexion and cannot assess adequately strength hip flexors iliopsoas secondary to pain in his knees and back pain.  Also has significant pain in the left shoulder keeping him from raising his left arm.  Lab Results: Recent Labs    06/07/18 0021 06/07/18 0408  WBC 7.7 8.1  HGB 9.7* 8.4*  HCT 32.3* 27.2*  PLT 203 208   BMET Recent Labs    06/07/18 0021 06/07/18 0408  NA 137 137  K 4.0 3.7  CL 103 102  CO2 24 23  GLUCOSE 91 89  BUN 9 9  CREATININE 0.97 0.97  CALCIUM 9.1 8.9    Studies/Results: Ct Cervical Spine Wo Contrast  Result Date: 06/07/2018 CLINICAL DATA:  Postoperative headache. EXAM: CT CERVICAL SPINE WITHOUT CONTRAST TECHNIQUE: Multidetector CT imaging of the cervical spine was performed without intravenous contrast. Multiplanar CT image reconstructions were also generated. COMPARISON:  MRI cervical spine June 07, 2018 and CT cervical spine March 22, 2018 and CT chest March 27, 2018 FINDINGS: ALIGNMENT: Straightened lordosis.  Vertebral bodies in alignment. SKULL BASE AND VERTEBRAE: Status post C4 corpectomy with interbody cage, C3 through C5 anterior plate and screw fixation,  intact well-seated hardware. 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 No fracture. Minimal C5-6 disc height loss and endplate spurring compatible with degenerative disc. No destructive bony lesions. Moderate LEFT C4-5 facet arthropathy. SOFT TISSUES AND SPINAL CANAL: Prevertebral soft tissue swelling. DISC LEVELS: Similar severe canal stenosis C3-4 and C4-5 due to ossified posterior longitudinal ligament. Mild canal stenosis C5-6. Severe LEFT C3-4 and LEFT C4-5 neural foraminal narrowing. Moderate bilateral C5-6 neural foraminal narrowing. UPPER CHEST: Lung apices are clear. Redemonstration of aneurysmal aorta. OTHER: None. IMPRESSION: 1. Status post C3 through C5 ACDF, C4 corpectomy without CT findings of hardware failure. Persistent prevertebral soft tissue swelling. 2. 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 3. Severe C3-4 and C4-5 neural foraminal narrowing. Electronically Signed   By: Awilda Metro M.D.   On: 06/07/2018 15:55   Mr Cervical Spine W Wo Contrast  Result Date: 06/07/2018 CLINICAL DATA:  Bilateral lower extremity weakness and spasms. History of 0PLL. Status post ACDF and C4 corpectomy March 24, 2018. EXAM: MRI CERVICAL AND LUMBAR SPINE WITHOUT AND WITH CONTRAST TECHNIQUE: Multiplanar and multiecho pulse sequences of the cervical spine, to include the craniocervical junction and cervicothoracic junction, and lumbar spine, were obtained without and with intravenous contrast. CONTRAST:  10 cc Gadavist COMPARISON:  MRI cervical and lumbar spine March 22, 2018. FINDINGS: MRI CERVICAL SPINE FINDINGS ALIGNMENT: Straightened cervical lordosis.  No malalignment. VERTEBRAE/DISCS: Status post C4 corpectomy and C5-C6  ACDF resulting in hardware artifact. Nonsurgically altered disc heights in signal generally preserved. No suspicious osseous or disc enhancement. CORD:Expansile T2  bright signal cervical spinal cord from C3-4 to C5-6 with superimposed 7 mm segment of the spinal cord enhancement at C5-6. No syrinx. No abnormal leptomeningeal or epidural enhancement. POSTERIOR FOSSA, VERTEBRAL ARTERIES, PARASPINAL TISSUES: No MR findings of ligamentous injury. Vertebral artery flow voids present. Prevertebral soft tissue thickening and edema at C2-3 measuring to 15 mm in AP dimension with homogeneous enhancement. DISC LEVELS: C2-3: LEFT central 5 x 7 mm (transverse by AP) low signal disc osteophyte complex/0PLL with contiguous bone marrow signal extending to C4-5. Displacement of spinal cord toward the RIGHT. Mild canal stenosis. Mild facet arthropathy. No neural foraminal narrowing. C3-4: LEFT disc osteophyte complex/0PLL, moderate canal stenosis with displacement of the spinal cord towards the RIGHT. Uncovertebral hypertrophy and mild facet arthropathy. Mild RIGHT and moderate LEFT neural foraminal narrowing suspected. C4-5: Broad-based disc osteophyte complex, uncovertebral hypertrophy resulting in severe canal stenosis, AP dimension of the canal is 6 mm. Moderate RIGHT and moderate to severe LEFT neural foraminal narrowing. C5-6: Small broad-based disc bulge, uncovertebral hypertrophy. Moderate canal stenosis, AP dimension of the canal is 8 mm. Moderate to severe neural foraminal narrowing. C6-7: Uncovertebral hypertrophy without canal stenosis. Mild neural foraminal narrowing. C7-T1: No disc bulge, canal stenosis nor neural foraminal narrowing. MRI LUMBAR SPINE FINDINGS SEGMENTATION: For the purposes of this report, the last well-formed intervertebral disc is reported as L5-S1. ALIGNMENT: Maintained lumbar lordosis. No malalignment. VERTEBRAE:Vertebral bodies are intact. Moderate C3-4 I moderate to severe C4-5 and C5-6 disc height loss is similar. Severe subacute discogenic endplate changes C3-4 C4-5, mild at C5-6. Acute L4-5 Schmorl's nodes with edema and enhancement. Mild edema and disc  enhancement C5-6 without suspicious features. CONUS MEDULLARIS AND CAUDA EQUINA: Conus medullaris terminates at L1-2 and demonstrates normal morphology and signal characteristics. Cauda equina is normal. No abnormal cord, leptomeningeal or epidural enhancement. PARASPINAL AND OTHER SOFT TISSUES: Bright interstitial STIR signal and enhancement within paraspinal muscles L1-2 through L5-S1. No focal fluid collection. Mild interstitial STIR signal enhancement L2-3 interspinous space seen with Baastrup's disease or injury. DISC LEVELS: L1-2: No disc bulge, canal stenosis nor neural foraminal narrowing. L2-3: Small similar RIGHT subarticular disc protrusion. Moderate to severe facet arthropathy and ligamentum flavum redundancy without canal stenosis. Mild-to-moderate RIGHT neural foraminal narrowing. L3-4: Similar small broad-based disc bulge eccentric laterally with enhancing LEFT extraforaminal enhancing annular fissure. Moderate facet arthropathy and ligamentum flavum redundancy without canal stenosis. Moderate LEFT neural foraminal narrowing and mild probable exited LEFT L3 nerve impingement. L4-5: Similar moderate broad-based disc bulge. Moderate facet arthropathy and ligamentum flavum redundancy. Mild canal stenosis. Mild bilateral neural foraminal narrowing. Similar exited RIGHT L4 nerve impingement. L5-S1: Similar small broad-based disc bulge. Severe RIGHT and moderate LEFT facet arthropathy. No canal stenosis. Minimal neural foraminal narrowing. IMPRESSION: MRI cervical spine: 1. Status post C3 through C5 ACDF, C4 corpectomy. Spinal cord edema C3-4 to C5-6 with 7 mm segment of spinal cord enhancement at C5-6 seen with subacute infarct, less likely demyelination. 2. Severe canal stenosis C4-5, moderate canal stenosis C3-4 and C5-6. 3. Neural foraminal narrowing C3-4 through C7-T1: Moderate to severe at C4-5 and C5-6 that may be overestimated by hardware artifact. MRI lumbar spine: 1. Similar degenerative change of  the lumbar spine, acute L4-5 Schmorl's nodes. No fracture or malalignment. 2. Paraspinal muscle strain, less likely myositis. L2-3 interspinous injury versus early Baastrup's. 3. Mild canal stenosis L4-5. 4. Neural foraminal narrowing  L2-3 through L5-S1: Moderate on the LEFT at L3-4. Probable exited LEFT L3 and similar exited RIGHT L4 nerve impingement. Electronically Signed   By: Awilda Metro M.D.   On: 06/07/2018 14:50   Mr Lumbar Spine W Wo Contrast  Result Date: 06/07/2018 CLINICAL DATA:  Bilateral lower extremity weakness and spasms. History of 0PLL. Status post ACDF and C4 corpectomy March 24, 2018. EXAM: MRI CERVICAL AND LUMBAR SPINE WITHOUT AND WITH CONTRAST TECHNIQUE: Multiplanar and multiecho pulse sequences of the cervical spine, to include the craniocervical junction and cervicothoracic junction, and lumbar spine, were obtained without and with intravenous contrast. CONTRAST:  10 cc Gadavist COMPARISON:  MRI cervical and lumbar spine March 22, 2018. FINDINGS: MRI CERVICAL SPINE FINDINGS ALIGNMENT: Straightened cervical lordosis.  No malalignment. VERTEBRAE/DISCS: Status post C4 corpectomy and C5-C6 ACDF resulting in hardware artifact. Nonsurgically altered disc heights in signal generally preserved. No suspicious osseous or disc enhancement. CORD:Expansile T2 bright signal cervical spinal cord from C3-4 to C5-6 with superimposed 7 mm segment of the spinal cord enhancement at C5-6. No syrinx. No abnormal leptomeningeal or epidural enhancement. POSTERIOR FOSSA, VERTEBRAL ARTERIES, PARASPINAL TISSUES: No MR findings of ligamentous injury. Vertebral artery flow voids present. Prevertebral soft tissue thickening and edema at C2-3 measuring to 15 mm in AP dimension with homogeneous enhancement. DISC LEVELS: C2-3: LEFT central 5 x 7 mm (transverse by AP) low signal disc osteophyte complex/0PLL with contiguous bone marrow signal extending to C4-5. Displacement of spinal cord toward the RIGHT. Mild canal  stenosis. Mild facet arthropathy. No neural foraminal narrowing. C3-4: LEFT disc osteophyte complex/0PLL, moderate canal stenosis with displacement of the spinal cord towards the RIGHT. Uncovertebral hypertrophy and mild facet arthropathy. Mild RIGHT and moderate LEFT neural foraminal narrowing suspected. C4-5: Broad-based disc osteophyte complex, uncovertebral hypertrophy resulting in severe canal stenosis, AP dimension of the canal is 6 mm. Moderate RIGHT and moderate to severe LEFT neural foraminal narrowing. C5-6: Small broad-based disc bulge, uncovertebral hypertrophy. Moderate canal stenosis, AP dimension of the canal is 8 mm. Moderate to severe neural foraminal narrowing. C6-7: Uncovertebral hypertrophy without canal stenosis. Mild neural foraminal narrowing. C7-T1: No disc bulge, canal stenosis nor neural foraminal narrowing. MRI LUMBAR SPINE FINDINGS SEGMENTATION: For the purposes of this report, the last well-formed intervertebral disc is reported as L5-S1. ALIGNMENT: Maintained lumbar lordosis. No malalignment. VERTEBRAE:Vertebral bodies are intact. Moderate C3-4 I moderate to severe C4-5 and C5-6 disc height loss is similar. Severe subacute discogenic endplate changes C3-4 C4-5, mild at C5-6. Acute L4-5 Schmorl's nodes with edema and enhancement. Mild edema and disc enhancement C5-6 without suspicious features. CONUS MEDULLARIS AND CAUDA EQUINA: Conus medullaris terminates at L1-2 and demonstrates normal morphology and signal characteristics. Cauda equina is normal. No abnormal cord, leptomeningeal or epidural enhancement. PARASPINAL AND OTHER SOFT TISSUES: Bright interstitial STIR signal and enhancement within paraspinal muscles L1-2 through L5-S1. No focal fluid collection. Mild interstitial STIR signal enhancement L2-3 interspinous space seen with Baastrup's disease or injury. DISC LEVELS: L1-2: No disc bulge, canal stenosis nor neural foraminal narrowing. L2-3: Small similar RIGHT subarticular disc  protrusion. Moderate to severe facet arthropathy and ligamentum flavum redundancy without canal stenosis. Mild-to-moderate RIGHT neural foraminal narrowing. L3-4: Similar small broad-based disc bulge eccentric laterally with enhancing LEFT extraforaminal enhancing annular fissure. Moderate facet arthropathy and ligamentum flavum redundancy without canal stenosis. Moderate LEFT neural foraminal narrowing and mild probable exited LEFT L3 nerve impingement. L4-5: Similar moderate broad-based disc bulge. Moderate facet arthropathy and ligamentum flavum redundancy. Mild canal stenosis. Mild bilateral  neural foraminal narrowing. Similar exited RIGHT L4 nerve impingement. L5-S1: Similar small broad-based disc bulge. Severe RIGHT and moderate LEFT facet arthropathy. No canal stenosis. Minimal neural foraminal narrowing. IMPRESSION: MRI cervical spine: 1. Status post C3 through C5 ACDF, C4 corpectomy. Spinal cord edema C3-4 to C5-6 with 7 mm segment of spinal cord enhancement at C5-6 seen with subacute infarct, less likely demyelination. 2. Severe canal stenosis C4-5, moderate canal stenosis C3-4 and C5-6. 3. Neural foraminal narrowing C3-4 through C7-T1: Moderate to severe at C4-5 and C5-6 that may be overestimated by hardware artifact. MRI lumbar spine: 1. Similar degenerative change of the lumbar spine, acute L4-5 Schmorl's nodes. No fracture or malalignment. 2. Paraspinal muscle strain, less likely myositis. L2-3 interspinous injury versus early Baastrup's. 3. Mild canal stenosis L4-5. 4. Neural foraminal narrowing L2-3 through L5-S1: Moderate on the LEFT at L3-4. Probable exited LEFT L3 and similar exited RIGHT L4 nerve impingement. Electronically Signed   By: Awilda Metro M.D.   On: 06/07/2018 14:50   Dg Knee 3 Views Left  Result Date: 06/07/2018 CLINICAL DATA:  Left knee pain for several weeks without known injury. EXAM: LEFT KNEE - 3 VIEW COMPARISON:  None. FINDINGS: No evidence of fracture, dislocation, or  joint effusion. Moderate narrowing of medial joint space is noted. Soft tissues are unremarkable. IMPRESSION: Moderate degenerative joint disease is noted medially. No acute abnormality seen in the left knee. Electronically Signed   By: Lupita Raider, M.D.   On: 06/07/2018 14:30   Dg Knee 3 View Right  Result Date: 06/07/2018 CLINICAL DATA:  Right knee pain for several weeks without known injury. EXAM: RIGHT KNEE - 3 VIEW COMPARISON:  None. FINDINGS: No fracture or dislocation is noted. Mild suprapatellar joint effusion is noted. Mild patellar spurring is noted. Moderate narrowing of medial joint space is noted with osteophyte formation. IMPRESSION: Mild suprapatellar joint effusion. Moderate degenerative joint disease is noted medially. No fracture or dislocation is noted. Electronically Signed   By: Lupita Raider, M.D.   On: 06/07/2018 14:31    Assessment/Plan: 52 year old gentleman with intermittent fever is low-grade temp this morning arthralgias myalgias and labs consistent with yet to be diagnosed source of infection.  I reviewed extensively all of his images both CT scan of his neck MRI scan of his neck MRI scan of his lumbar spine.  I do not see a focus of infection in the spine his hardware appears to be in good position there is still some ongoing spinal cord compression off to the left high about C3-4 that was in place but I was unable to get to from the anterior corpectomy and we have discussed back then having to do at some point a posterior procedure to further decompress the spinal cord and I do think he needs that.  However this is not change this is been there since July this does not account for an acute change or decompensation.  I do think that with his significantly elevated sed rate intermittent fevers and his complaints with supersensitivity touching all of his joints and movement of his legs that we do have a significant infection somewhere.  I do agree with proceeding 4 with an  infectious disease consult.  I will give him a couple doses of steroid understanding that this may make the infection hard to treat but I do think we have to settle down some of his inflammation and I think we can do a couple doses without risk of making the infection  worse.  If we are not able to diagnose a source we may need to schedule on empiric broad-spectrum IV antibiotics for couple weeks extending to oral depending on what ID thinks.  I am happy to schedule his posterior cervical operation at any point but certainly I do not want to do it in the setting of underlying infection that we have been adequately identified.  LOS: 1 day     Joziyah Roblero P 06/08/2018, 7:53 AM

## 2018-06-09 DIAGNOSIS — M2550 Pain in unspecified joint: Secondary | ICD-10-CM

## 2018-06-09 LAB — SYNOVIAL CELL COUNT + DIFF, W/ CRYSTALS
Lymphocytes-Synovial Fld: 2 % (ref 0–20)
Monocyte-Macrophage-Synovial Fluid: 6 % — ABNORMAL LOW (ref 50–90)
NEUTROPHIL, SYNOVIAL: 92 % — AB (ref 0–25)
WBC, Synovial: 16100 /mm3 — ABNORMAL HIGH (ref 0–200)

## 2018-06-09 LAB — ANA W/REFLEX IF POSITIVE: Anti Nuclear Antibody(ANA): NEGATIVE

## 2018-06-09 LAB — CBC
HEMATOCRIT: 29.6 % — AB (ref 39.0–52.0)
HEMOGLOBIN: 9.2 g/dL — AB (ref 13.0–17.0)
MCH: 27.8 pg (ref 26.0–34.0)
MCHC: 31.1 g/dL (ref 30.0–36.0)
MCV: 89.4 fL (ref 78.0–100.0)
Platelets: 309 10*3/uL (ref 150–400)
RBC: 3.31 MIL/uL — ABNORMAL LOW (ref 4.22–5.81)
RDW: 13.7 % (ref 11.5–15.5)
WBC: 16.4 10*3/uL — AB (ref 4.0–10.5)

## 2018-06-09 LAB — CK: CK TOTAL: 60 U/L (ref 49–397)

## 2018-06-09 LAB — ANCA TITERS: C-ANCA: <1:20 {titer}

## 2018-06-09 LAB — RHEUMATOID FACTOR: Rhuematoid fact SerPl-aCnc: 18 IU/mL — ABNORMAL HIGH (ref 0.0–13.9)

## 2018-06-09 LAB — APTT: APTT: 84 s — AB (ref 24–36)

## 2018-06-09 LAB — HEPARIN LEVEL (UNFRACTIONATED): HEPARIN UNFRACTIONATED: 0.83 [IU]/mL — AB (ref 0.30–0.70)

## 2018-06-09 MED ORDER — POLYETHYLENE GLYCOL 3350 17 G PO PACK
17.0000 g | PACK | Freq: Two times a day (BID) | ORAL | Status: DC
  Administered 2018-06-09 – 2018-06-30 (×8): 17 g via ORAL
  Filled 2018-06-09 (×29): qty 1

## 2018-06-09 MED ORDER — LACTULOSE 10 GM/15ML PO SOLN
30.0000 g | Freq: Once | ORAL | Status: AC
  Administered 2018-06-09: 30 g via ORAL
  Filled 2018-06-09: qty 60

## 2018-06-09 MED ORDER — HYDRALAZINE HCL 20 MG/ML IJ SOLN: 10.0000 mg | Freq: Four times a day (QID) | INTRAMUSCULAR | Status: DC | PRN

## 2018-06-09 MED ORDER — AMLODIPINE BESYLATE 2.5 MG PO TABS
2.5000 mg | ORAL_TABLET | Freq: Every day | ORAL | Status: DC
  Administered 2018-06-10 – 2018-06-15 (×5): 2.5 mg via ORAL
  Filled 2018-06-09 (×7): qty 1

## 2018-06-09 MED ORDER — BISACODYL 10 MG RE SUPP
10.0000 mg | Freq: Once | RECTAL | Status: AC
  Administered 2018-06-09: 10 mg via RECTAL
  Filled 2018-06-09: qty 1

## 2018-06-09 MED ORDER — AMLODIPINE BESYLATE 5 MG PO TABS: 5.0000 mg | ORAL_TABLET | Freq: Every day | ORAL | Status: DC

## 2018-06-09 MED ORDER — PREDNISONE 20 MG PO TABS
60.0000 mg | ORAL_TABLET | Freq: Every day | ORAL | Status: AC
  Administered 2018-06-09 – 2018-06-11 (×3): 60 mg via ORAL
  Filled 2018-06-09 (×3): qty 3

## 2018-06-09 MED ORDER — FEBUXOSTAT 40 MG PO TABS
40.0000 mg | ORAL_TABLET | Freq: Every day | ORAL | Status: DC
  Administered 2018-06-09 – 2018-07-15 (×36): 40 mg via ORAL
  Filled 2018-06-09 (×37): qty 1

## 2018-06-09 NOTE — Progress Notes (Signed)
NEUROSURGERY PROGRESS NOTE  Doing well. Feels like his pain is a little better today after getting some doses of steroids yesterday.   Temp:  [97.5 F (36.4 C)-98.2 F (36.8 C)] 98.1 F (36.7 C) (09/27 1109) Pulse Rate:  [64-86] 86 (09/27 1109) Resp:  [18-20] 20 (09/27 1109) BP: (119-146)/(80-94) 119/89 (09/27 1109) SpO2:  [94 %-100 %] 97 % (09/27 1109)  Plan: No new nsgy recom. Waiting for blood culture to come back.   Sherryl Manges, NP 06/09/2018 1:37 PM

## 2018-06-09 NOTE — Progress Notes (Signed)
[facility]  Date of Admission:  06/06/2018             ASSESSMENT/PLAN  [name] has been afebrile for last 24 hours with improved but still present arthralgias. Clinically he appears better today. He received 40 mg of Decadron yesterday. This continues to appear more inflammation as opposed to infection. Blood cultures have remained negative. ANA and ANCA are in process with RA Latex Turbid elevated, although non-specific. Dr. Marisa Severin is working on Rheumatology consult.   1. Continue with prednisone/decadron per primary team and neurosurgery. 2. Monitor off antibiotics. 3. Monitor cultures and blood work pending Rheumatology evaluation.   Principal Problem:   Fever Active Problems:   Benign essential HTN   Cord compression (HCC)   History of pulmonary embolism   . gabapentin  300 mg Oral TID  . hydrochlorothiazide  12.5 mg Oral Daily  . pantoprazole  40 mg Oral BID    SUBJECTIVE:  Afebrile over night. Elevated RA Latex Turbid at 18. Blood cultures remain without growth for 2 days and <24 hours. ANA and ANCA remain in process.  Improved today although continues to have pain. Has received Robaxin and Decadron which are helping. He is more comfortable today. Denies fevers, chills, or sweats.   Allergies  Allergen Reactions  . Nsaids Other (See Comments)    G6PD  . Sulfa Antibiotics Other (See Comments)    G6PD     Review of Systems: Review of Systems  Constitutional: Negative for chills and fever.  Respiratory: Negative for cough, sputum production, shortness of breath and wheezing.   Cardiovascular: Negative for chest pain and leg swelling.  Gastrointestinal: Negative for abdominal pain, constipation, diarrhea, nausea and vomiting.  Genitourinary: Negative for dysuria, frequency, hematuria and urgency.  Skin: Negative for rash.      OBJECTIVE: Vitals:   06/08/18 1915 06/08/18 2315 06/09/18 0305 06/09/18 0740  BP: (!) 142/80 (!)  146/91 (!) 144/89 (!) 134/94  Pulse: 78 77 64 73  Resp: 18 18 18 20   Temp: (!) 97.5 F (36.4 C) 98 F (36.7 C) 97.8 F (36.6 C) 97.7 F (36.5 C)  TempSrc: Oral Oral Oral Oral  SpO2: 96% 94% 96% 100%  Weight:      Height:       Body mass index is 40.3 kg/m.  Physical Exam  Constitutional: He is oriented to person, place, and time. He appears well-developed and well-nourished. No distress.  Cardiovascular: Normal rate, regular rhythm, normal heart sounds and intact distal pulses. Exam reveals no gallop and no friction rub.  No murmur heard. Pulmonary/Chest: Effort normal and breath sounds normal. No stridor. No respiratory distress. He has no wheezes. He has no rales. He exhibits no tenderness.  Abdominal: Soft. Bowel sounds are normal. He exhibits no distension and no mass. There is no tenderness. There is no guarding.  Musculoskeletal:  No obvious deformity or discoloration of bilateral shoulders. Decreased tenderness compared to previous exams.   Neurological: He is alert and oriented to person, place, and time.  Skin: Skin is warm and dry.  Psychiatric: He has a normal mood and affect. His behavior is normal. Judgment and thought content normal.    Lab Results Lab Results  Component Value Date   WBC 10.1 06/08/2018   HGB 8.6 (L) 06/08/2018   HCT 28.4 (L) 06/08/2018   MCV 90.7 06/08/2018   PLT 256 06/08/2018    Lab Results  Component Value Date   CREATININE 0.97 06/07/2018  BUN 9 06/07/2018   NA 137 06/07/2018   K 3.7 06/07/2018   CL 102 06/07/2018   CO2 23 06/07/2018    Lab Results  Component Value Date   ALT 17 06/07/2018   AST 26 06/07/2018   ALKPHOS 71 06/07/2018   BILITOT 0.8 06/07/2018     Microbiology: Recent Results (from the past 240 hour(s))  Culture, blood (routine x 2)     Status: None (Preliminary result)   Collection Time: 06/07/18 12:23 AM  Result Value Ref Range Status   Specimen Description BLOOD RIGHT ANTECUBITAL  Final   Special  Requests   Final    BOTTLES DRAWN AEROBIC ONLY Blood Culture results may not be optimal due to an inadequate volume of blood received in culture bottles   Culture   Final    NO GROWTH 2 DAYS Performed at [facility], [address] [place], [place] [address]    Report Status PENDING  Incomplete  Culture, blood (routine x 2)     Status: None (Preliminary result)   Collection Time: 06/07/18 12:33 AM  Result Value Ref Range Status   Specimen Description BLOOD RIGHT HAND  Final   Special Requests   Final    BOTTLES DRAWN AEROBIC ONLY Blood Culture results may not be optimal due to an inadequate volume of blood received in culture bottles   Culture   Final    NO GROWTH 2 DAYS Performed at [facility], [address] [place], [place] [address]    Report Status PENDING  Incomplete  Culture, Urine     Status: Abnormal   Collection Time: 06/07/18  8:34 AM  Result Value Ref Range Status   Specimen Description URINE, CATHETERIZED  Final   Special Requests NONE  Final   Culture (A)  Final    <10,000 COLONIES/mL INSIGNIFICANT GROWTH Performed at [facility], [address] [place], [place] [address]    Report Status 06/08/2018 FINAL  Final  Culture, blood (routine x 2)     Status: None (Preliminary result)   Collection Time: 06/08/18  3:03 PM  Result Value Ref Range Status   Specimen Description BLOOD RIGHT ANTECUBITAL  Final   Special Requests   Final    BOTTLES DRAWN AEROBIC AND ANAEROBIC Blood Culture results may not be optimal due to an excessive volume of blood received in culture bottles   Culture   Final    NO GROWTH < 24 HOURS Performed at [facility], [address], [place], [place] [address]    Report Status PENDING  Incomplete  Culture, blood (routine x 2)     Status: None (Preliminary result)   Collection Time: 06/08/18  3:03 PM  Result Value Ref Range Status   Specimen Description BLOOD RIGHT HAND  Final   Special Requests   Final    BOTTLES  DRAWN AEROBIC AND ANAEROBIC Blood Culture results may not be optimal due to an excessive volume of blood received in culture bottles   Culture   Final    NO GROWTH < 24 HOURS Performed at [facility], [address] [place], [place] [address]    Report Status PENDING  Incomplete     Marcos Eke, NP [facility] [phone] Pager  06/09/2018  8:40 AM

## 2018-06-09 NOTE — Progress Notes (Signed)
Patient Demographics:    [name] [name], is a 52 y.o. male, DOB - October 14, 1965, ACZ:[id number]  Admit date - 06/06/2018   Admitting Physician Rise Patience, MD  Outpatient Primary MD for the patient is Helane Rima, MD  LOS - 2   No chief complaint on file.       Subjective:    [name] [name] today has No chest pain, complains of polymyalgia/polyarthralgia despite IV Decadron on 06/07/2018, fever curve trending down, no vomiting or diarrhea, actually complains of constipation  Assessment  & Plan :    Principal Problem:   Fever Active Problems:   Benign essential HTN   Cord compression [facility])   History of pulmonary embolism  Brief Summary:- 51 y.o.malewithhistory of hypertension who has had a recent ACDF at C3- C5 by Dr. Saintclair Halsted in July 1093 complicated by pulmonary embolism was placed on apixaban subsequently discharged to rehab and patient had moved to [place] area to [place], currently went home to live with his parents.  Readmitted on 06/07/18 with concerns about persistent low-grade fevers and chills over the last couple of months ESR elevated above 140,  WBC 8.1,  CRP 20.4....    Plan:- 1)Persistent Fevers and Elevated inflammatory markers--- T-max 100.7, ESR over 140, CRP over 20, now has leukocytosis (had decadron on 06/07/18),, infectious versus inflammatory etiology, urine and blood cultures are negative so far, chest x-ray without acute findings, CT and MRI of the cervical and lumbar spine with postop changes but no definite evidence for discitis or epidural abscess.  Infectious disease consult/input appreciated, x-rays of both knees reveals mild effusion on the right knee otherwise no acute findings , Bil DJD noted, Rt Knee joint aspirate from 06/09/2018 without evidence of infection/septic arthritis. D/w Dr Lahoma Rocker [facility] rheumatology), his input is appreciated..... (cell  phone--[phone]), he advised prednisone 60 mg x 3 days and watch for clinical response, please reach out to him again when further inflammatory markers/results are back.  Clinically no evidence of significant CNS infection  2)S/p ACDF at C3- C5  On 03/24/18--- Dr Saintclair Halsted--- as per neurosurgical attending  Dr. Saintclair Halsted, imaging studies including cervical and lumbar MRI studies and clinical exam does not support any acute postop complications from his recent neurosurgical procedure,  3)Bil Knee Pain/Swelling- Rt > Lt-- ????  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   4)HTN--stable, stop HCTZ as this has a tendency to increase uric acid levels, use amlodipine 2.5 mg daily instead  5)Polymyalgia and Polyarthralgia----total CK is only 60, patient did not have a leukocytosis until he received Decadron, fevers may be inflammatory versus infectious etiology, RH factor mildly elevated at 18, ESR over 140, CRP over 20, CCP and aldolase pending, C-ANCA and P-ANCA-are not elevated, ANA Neg, LFTs wnl  6)Recent PE--- continue IV heparin until determination has been made that no further investigation/procedure needs to be done,  then may switch back to apixaban  7)Gout-patient has had gout for over 30 years he tells me, uric acid levels is actually not elevated at this time, right knee fluid aspirate suggest gout and pseudogout, prednisone 60 mg daily  x3 days as above #1,  It is conceivable the patient's polyarthralgia and pain issues are related to flareup of his gout, fevers may be secondary to inflammation, cultures negative so far.  No evidence of significant bacterial infection at this time so hold off on antibiotics   Disposition/Need for in-Hospital Stay- patient unable to be discharged at this time due to fevers,  elevated inflammatory markers etiologies/definitive diagnosis pending cultures and other work-up, significant polymyalgia and polyarthralgia with inability to perform mobility related activities of daily living   Code Status : Full Code  Disposition Plan  : TBD  Consults  :  Ortho/ID/Neurosursery/Rheumatology Phone consult  DVT Prophylaxis  : IV heparin due to history of PE/previously on apixaban prior to admission  Lab Results  Component Value Date   PLT 309 06/09/2018    Inpatient Medications  Scheduled Meds: . [START ON 06/10/2018] amLODipine  2.5 mg Oral Daily  . febuxostat  40 mg Oral Daily  . gabapentin  300 mg Oral TID  . pantoprazole  40 mg Oral BID  . polyethylene glycol  17 g Oral BID  . predniSONE  60 mg Oral Q breakfast   Continuous Infusions: . heparin 1,500 Units/hr (06/09/18 0959)  . methocarbamol (ROBAXIN) IV 500 mg (06/09/18 1000)   PRN Meds:.acetaminophen **OR** acetaminophen, hydrALAZINE, methocarbamol (ROBAXIN) IV, methocarbamol, morphine injection, ondansetron **OR** ondansetron (ZOFRAN) IV, oxyCODONE    Anti-infectives (From admission, onward)   None        Objective:   Vitals:   06/09/18 0305 06/09/18 0740 06/09/18 1109 06/09/18 1536  BP: (!) 144/89 (!) 134/94 119/89 133/84  Pulse: 64 73 86 85  Resp: '18 20 20 20  ' Temp: 97.8 F (36.6 C) 97.7 F (36.5 C) 98.1 F (36.7 C) 98.7 F (37.1 C)  TempSrc: Oral Oral Oral Oral  SpO2: 96% 100% 97% 95%  Weight:      Height:        Wt Readings from Last 3 Encounters:  06/06/18 123.8 kg  05/26/18 123.8 kg  04/26/18 118 kg     Intake/Output Summary (Last 24 hours) at 06/09/2018 1739 Last data filed at 06/09/2018 1539 Gross per 24 hour  Intake 100 ml  Output 1300 ml  Net -1200 ml     Physical Exam Patient is examined daily including today on 06/09/18 , exams remain the same as of yesterday except that has changed   Gen:- Awake Alert, in no apparent distress  HEENT:- [email or web address], No sclera  icterus Neck-Supple Neck,No JVD,.  Lungs-  CTAB , good air movement bilaterally CV- S1, S2 normal, regular Abd-  +ve B.Sounds, Abd Soft, No tenderness,    Extremity/Skin:-Bilateral ankles, shoulder pain and stiffness persist, bilateral knee swelling, right more than left, reduced range of motion of both knees due to pain, no significant erythema or streaking, no significant redness or significant inflammatory changes over patient's shoulders knees or ankles, he does have pain with range of motion he does have stiffness he does have some warmth over the joints Psych-affect is appropriate, oriented x3 Neuro-no new focal deficits, no tremors   Data Review:   Micro Results Recent Results (from the past 240 hour(s))  Culture, blood (routine x 2)     Status: None (Preliminary result)   Collection Time: 06/07/18 12:23 AM  Result Value Ref Range Status   Specimen Description BLOOD RIGHT ANTECUBITAL  Final   Special Requests   Final    BOTTLES DRAWN AEROBIC ONLY Blood Culture results may not be optimal  due to an inadequate volume of blood received in culture bottles   Culture   Final    NO GROWTH 2 DAYS Performed at [facility], [place] [address], [place], [place] [address]    Report Status PENDING  Incomplete  Culture, blood (routine x 2)     Status: None (Preliminary result)   Collection Time: 06/07/18 12:33 AM  Result Value Ref Range Status   Specimen Description BLOOD RIGHT HAND  Final   Special Requests   Final    BOTTLES DRAWN AEROBIC ONLY Blood Culture results may not be optimal due to an inadequate volume of blood received in culture bottles   Culture   Final    NO GROWTH 2 DAYS Performed at [facility], [place] [address], [place], [place] [address]    Report Status PENDING  Incomplete  Culture, Urine     Status: Abnormal   Collection Time: 06/07/18  8:34 AM  Result Value Ref Range Status   Specimen Description URINE, CATHETERIZED  Final   Special Requests NONE  Final    Culture (A)  Final    <10,000 COLONIES/mL INSIGNIFICANT GROWTH Performed at [place] [address], [place], [place] [address]    Report Status 06/08/2018 FINAL  Final  Culture, blood (routine x 2)     Status: None (Preliminary result)   Collection Time: 06/08/18  3:03 PM  Result Value Ref Range Status   Specimen Description BLOOD RIGHT ANTECUBITAL  Final   Special Requests   Final    BOTTLES DRAWN AEROBIC AND ANAEROBIC Blood Culture results may not be optimal due to an excessive volume of blood received in culture bottles   Culture   Final    NO GROWTH < 24 HOURS Performed at [facility], [place] [address], [place], [place] [address]    Report Status PENDING  Incomplete  Culture, blood (routine x 2)     Status: None (Preliminary result)   Collection Time: 06/08/18  3:03 PM  Result Value Ref Range Status   Specimen Description BLOOD RIGHT HAND  Final   Special Requests   Final    BOTTLES DRAWN AEROBIC AND ANAEROBIC Blood Culture results may not be optimal due to an excessive volume of blood received in culture bottles   Culture   Final    NO GROWTH < 24 HOURS Performed at [facility], [place] [address] [place], [place] [address]    Report Status PENDING  Incomplete  Body fluid culture     Status: None (Preliminary result)   Collection Time: 06/09/18  2:12 PM  Result Value Ref Range Status   Specimen Description SYNOVIAL  Final   Special Requests RIGHT KNEE  Final   Gram Stain   Final    MODERATE WBC PRESENT, PREDOMINANTLY PMN NO ORGANISMS SEEN Performed at [facility], [place] [address] [place],  [address]    Culture PENDING  Incomplete   Report Status PENDING  Incomplete    Radiology Reports Ct Cervical Spine Wo Contrast  Result Date: 06/07/2018 CLINICAL DATA:  Postoperative headache. EXAM: CT CERVICAL SPINE WITHOUT CONTRAST TECHNIQUE: Multidetector CT imaging of the cervical spine was performed without intravenous contrast. Multiplanar CT  image reconstructions were also generated. COMPARISON:  MRI cervical spine June 07, 2018 and CT cervical spine March 22, 2018 and CT chest March 27, 2018 FINDINGS: ALIGNMENT: Straightened lordosis.  Vertebral bodies in alignment. SKULL BASE AND VERTEBRAE: Status post C4  corpectomy with interbody cage, C3 through C5 anterior plate and screw fixation, intact well-seated hardware. 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 No fracture. Minimal C5-6 disc height loss and endplate spurring compatible with degenerative disc. No destructive bony lesions. Moderate LEFT C4-5 facet arthropathy. SOFT TISSUES AND SPINAL CANAL: Prevertebral soft tissue swelling. DISC LEVELS: Similar severe canal stenosis C3-4 and C4-5 due to ossified posterior longitudinal ligament. Mild canal stenosis C5-6. Severe LEFT C3-4 and LEFT C4-5 neural foraminal narrowing. Moderate bilateral C5-6 neural foraminal narrowing. UPPER CHEST: Lung apices are clear. Redemonstration of aneurysmal aorta. OTHER: None. IMPRESSION: 1. Status post C3 through C5 ACDF, C4 corpectomy without CT findings of hardware failure. Persistent prevertebral soft tissue swelling. 2. 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 3. Severe C3-4 and C4-5 neural foraminal narrowing. Electronically Signed   By: Elon Alas M.D.   On: 06/07/2018 15:55   Mr Cervical Spine W Wo Contrast  Result Date: 06/07/2018 CLINICAL DATA:  Bilateral lower extremity weakness and spasms. History of 0PLL. Status post ACDF and C4 corpectomy March 24, 2018. EXAM: MRI CERVICAL AND LUMBAR SPINE WITHOUT AND WITH CONTRAST TECHNIQUE: Multiplanar and multiecho pulse sequences of the cervical spine, to include the craniocervical junction and cervicothoracic junction, and lumbar spine, were obtained without and with intravenous contrast. CONTRAST:  10 cc Gadavist COMPARISON:  MRI  cervical and lumbar spine March 22, 2018. FINDINGS: MRI CERVICAL SPINE FINDINGS ALIGNMENT: Straightened cervical lordosis.  No malalignment. VERTEBRAE/DISCS: Status post C4 corpectomy and C5-C6 ACDF resulting in hardware artifact. Nonsurgically altered disc heights in signal generally preserved. No suspicious osseous or disc enhancement. CORD:Expansile T2 bright signal cervical spinal cord from C3-4 to C5-6 with superimposed 7 mm segment of the spinal cord enhancement at C5-6. No syrinx. No abnormal leptomeningeal or epidural enhancement. POSTERIOR FOSSA, VERTEBRAL ARTERIES, PARASPINAL TISSUES: No MR findings of ligamentous injury. Vertebral artery flow voids present. Prevertebral soft tissue thickening and edema at C2-3 measuring to 15 mm in AP dimension with homogeneous enhancement. DISC LEVELS: C2-3: LEFT central 5 x 7 mm (transverse by AP) low signal disc osteophyte complex/0PLL with contiguous bone marrow signal extending to C4-5. Displacement of spinal cord toward the RIGHT. Mild canal stenosis. Mild facet arthropathy. No neural foraminal narrowing. C3-4: LEFT disc osteophyte complex/0PLL, moderate canal stenosis with displacement of the spinal cord towards the RIGHT. Uncovertebral hypertrophy and mild facet arthropathy. Mild RIGHT and moderate LEFT neural foraminal narrowing suspected. C4-5: Broad-based disc osteophyte complex, uncovertebral hypertrophy resulting in severe canal stenosis, AP dimension of the canal is 6 mm. Moderate RIGHT and moderate to severe LEFT neural foraminal narrowing. C5-6: Small broad-based disc bulge, uncovertebral hypertrophy. Moderate canal stenosis, AP dimension of the canal is 8 mm. Moderate to severe neural foraminal narrowing. C6-7: Uncovertebral hypertrophy without canal stenosis. Mild neural foraminal narrowing. C7-T1: No disc bulge, canal stenosis nor neural foraminal narrowing. MRI LUMBAR SPINE FINDINGS SEGMENTATION: For the purposes of this report, the last well-formed  intervertebral disc is reported as L5-S1. ALIGNMENT: Maintained lumbar lordosis. No malalignment. VERTEBRAE:Vertebral bodies are intact. Moderate C3-4 I moderate to severe C4-5 and C5-6 disc height loss is similar. Severe subacute discogenic endplate changes U7-2 Z3-6, mild at C5-6. Acute L4-5 Schmorl's nodes with edema and enhancement. Mild edema and disc enhancement C5-6 without suspicious features. CONUS MEDULLARIS AND CAUDA EQUINA: Conus medullaris terminates at L1-2 and demonstrates normal morphology and signal characteristics.  Cauda equina is normal. No abnormal cord, leptomeningeal or epidural enhancement. PARASPINAL AND OTHER SOFT TISSUES: Bright interstitial STIR signal and enhancement within paraspinal muscles L1-2 through L5-S1. No focal fluid collection. Mild interstitial STIR signal enhancement L2-3 interspinous space seen with Baastrup's disease or injury. DISC LEVELS: L1-2: No disc bulge, canal stenosis nor neural foraminal narrowing. L2-3: Small similar RIGHT subarticular disc protrusion. Moderate to severe facet arthropathy and ligamentum flavum redundancy without canal stenosis. Mild-to-moderate RIGHT neural foraminal narrowing. L3-4: Similar small broad-based disc bulge eccentric laterally with enhancing LEFT extraforaminal enhancing annular fissure. Moderate facet arthropathy and ligamentum flavum redundancy without canal stenosis. Moderate LEFT neural foraminal narrowing and mild probable exited LEFT L3 nerve impingement. L4-5: Similar moderate broad-based disc bulge. Moderate facet arthropathy and ligamentum flavum redundancy. Mild canal stenosis. Mild bilateral neural foraminal narrowing. Similar exited RIGHT L4 nerve impingement. L5-S1: Similar small broad-based disc bulge. Severe RIGHT and moderate LEFT facet arthropathy. No canal stenosis. Minimal neural foraminal narrowing. IMPRESSION: MRI cervical spine: 1. Status post C3 through C5 ACDF, C4 corpectomy. Spinal cord edema C3-4 to C5-6 with  7 mm segment of spinal cord enhancement at C5-6 seen with subacute infarct, less likely demyelination. 2. Severe canal stenosis C4-5, moderate canal stenosis C3-4 and C5-6. 3. Neural foraminal narrowing C3-4 through C7-T1: Moderate to severe at C4-5 and C5-6 that may be overestimated by hardware artifact. MRI lumbar spine: 1. Similar degenerative change of the lumbar spine, acute L4-5 Schmorl's nodes. No fracture or malalignment. 2. Paraspinal muscle strain, less likely myositis. L2-3 interspinous injury versus early Baastrup's. 3. Mild canal stenosis L4-5. 4. Neural foraminal narrowing L2-3 through L5-S1: Moderate on the LEFT at L3-4. Probable exited LEFT L3 and similar exited RIGHT L4 nerve impingement. Electronically Signed   By: Elon Alas M.D.   On: 06/07/2018 14:50   Mr Lumbar Spine W Wo Contrast  Result Date: 06/07/2018 CLINICAL DATA:  Bilateral lower extremity weakness and spasms. History of 0PLL. Status post ACDF and C4 corpectomy March 24, 2018. EXAM: MRI CERVICAL AND LUMBAR SPINE WITHOUT AND WITH CONTRAST TECHNIQUE: Multiplanar and multiecho pulse sequences of the cervical spine, to include the craniocervical junction and cervicothoracic junction, and lumbar spine, were obtained without and with intravenous contrast. CONTRAST:  10 cc Gadavist COMPARISON:  MRI cervical and lumbar spine March 22, 2018. FINDINGS: MRI CERVICAL SPINE FINDINGS ALIGNMENT: Straightened cervical lordosis.  No malalignment. VERTEBRAE/DISCS: Status post C4 corpectomy and C5-C6 ACDF resulting in hardware artifact. Nonsurgically altered disc heights in signal generally preserved. No suspicious osseous or disc enhancement. CORD:Expansile T2 bright signal cervical spinal cord from C3-4 to C5-6 with superimposed 7 mm segment of the spinal cord enhancement at C5-6. No syrinx. No abnormal leptomeningeal or epidural enhancement. POSTERIOR FOSSA, VERTEBRAL ARTERIES, PARASPINAL TISSUES: No MR findings of ligamentous injury. Vertebral  artery flow voids present. Prevertebral soft tissue thickening and edema at C2-3 measuring to 15 mm in AP dimension with homogeneous enhancement. DISC LEVELS: C2-3: LEFT central 5 x 7 mm (transverse by AP) low signal disc osteophyte complex/0PLL with contiguous bone marrow signal extending to C4-5. Displacement of spinal cord toward the RIGHT. Mild canal stenosis. Mild facet arthropathy. No neural foraminal narrowing. C3-4: LEFT disc osteophyte complex/0PLL, moderate canal stenosis with displacement of the spinal cord towards the RIGHT. Uncovertebral hypertrophy and mild facet arthropathy. Mild RIGHT and moderate LEFT neural foraminal narrowing suspected. C4-5: Broad-based disc osteophyte complex, uncovertebral hypertrophy resulting in severe canal stenosis, AP dimension of the canal is 6 mm. Moderate RIGHT and moderate  to severe LEFT neural foraminal narrowing. C5-6: Small broad-based disc bulge, uncovertebral hypertrophy. Moderate canal stenosis, AP dimension of the canal is 8 mm. Moderate to severe neural foraminal narrowing. C6-7: Uncovertebral hypertrophy without canal stenosis. Mild neural foraminal narrowing. C7-T1: No disc bulge, canal stenosis nor neural foraminal narrowing. MRI LUMBAR SPINE FINDINGS SEGMENTATION: For the purposes of this report, the last well-formed intervertebral disc is reported as L5-S1. ALIGNMENT: Maintained lumbar lordosis. No malalignment. VERTEBRAE:Vertebral bodies are intact. Moderate C3-4 I moderate to severe C4-5 and C5-6 disc height loss is similar. Severe subacute discogenic endplate changes U8-8 K8-0, mild at C5-6. Acute L4-5 Schmorl's nodes with edema and enhancement. Mild edema and disc enhancement C5-6 without suspicious features. CONUS MEDULLARIS AND CAUDA EQUINA: Conus medullaris terminates at L1-2 and demonstrates normal morphology and signal characteristics. Cauda equina is normal. No abnormal cord, leptomeningeal or epidural enhancement. PARASPINAL AND OTHER SOFT  TISSUES: Bright interstitial STIR signal and enhancement within paraspinal muscles L1-2 through L5-S1. No focal fluid collection. Mild interstitial STIR signal enhancement L2-3 interspinous space seen with Baastrup's disease or injury. DISC LEVELS: L1-2: No disc bulge, canal stenosis nor neural foraminal narrowing. L2-3: Small similar RIGHT subarticular disc protrusion. Moderate to severe facet arthropathy and ligamentum flavum redundancy without canal stenosis. Mild-to-moderate RIGHT neural foraminal narrowing. L3-4: Similar small broad-based disc bulge eccentric laterally with enhancing LEFT extraforaminal enhancing annular fissure. Moderate facet arthropathy and ligamentum flavum redundancy without canal stenosis. Moderate LEFT neural foraminal narrowing and mild probable exited LEFT L3 nerve impingement. L4-5: Similar moderate broad-based disc bulge. Moderate facet arthropathy and ligamentum flavum redundancy. Mild canal stenosis. Mild bilateral neural foraminal narrowing. Similar exited RIGHT L4 nerve impingement. L5-S1: Similar small broad-based disc bulge. Severe RIGHT and moderate LEFT facet arthropathy. No canal stenosis. Minimal neural foraminal narrowing. IMPRESSION: MRI cervical spine: 1. Status post C3 through C5 ACDF, C4 corpectomy. Spinal cord edema C3-4 to C5-6 with 7 mm segment of spinal cord enhancement at C5-6 seen with subacute infarct, less likely demyelination. 2. Severe canal stenosis C4-5, moderate canal stenosis C3-4 and C5-6. 3. Neural foraminal narrowing C3-4 through C7-T1: Moderate to severe at C4-5 and C5-6 that may be overestimated by hardware artifact. MRI lumbar spine: 1. Similar degenerative change of the lumbar spine, acute L4-5 Schmorl's nodes. No fracture or malalignment. 2. Paraspinal muscle strain, less likely myositis. L2-3 interspinous injury versus early Baastrup's. 3. Mild canal stenosis L4-5. 4. Neural foraminal narrowing L2-3 through L5-S1: Moderate on the LEFT at L3-4.  Probable exited LEFT L3 and similar exited RIGHT L4 nerve impingement. Electronically Signed   By: Elon Alas M.D.   On: 06/07/2018 14:50   US Venous Img Lower Bilateral  Result Date: 06/01/2018 CLINICAL DATA:  52 year old male with a history of prior DVT, cervical spine surgery and local hemorrhage on blood thinners. Treated with caval interruption 03/28/2018. EXAM: BILATERAL LOWER EXTREMITY VENOUS DOPPLER ULTRASOUND TECHNIQUE: Gray-scale sonography with graded compression, as well as color Doppler and duplex ultrasound were performed to evaluate the lower extremity deep venous systems from the level of the common femoral vein and including the common femoral, femoral, profunda femoral, popliteal and calf veins including the posterior tibial, peroneal and gastrocnemius veins when visible. The superficial great saphenous vein was also interrogated. Spectral Doppler was utilized to evaluate flow at rest and with distal augmentation maneuvers in the common femoral, femoral and popliteal veins. COMPARISON:  None. FINDINGS: RIGHT LOWER EXTREMITY Common Femoral Vein: No evidence of thrombus. Normal compressibility, respiratory phasicity and response to augmentation. Saphenofemoral  Junction: No evidence of thrombus. Normal compressibility and flow on color Doppler imaging. Profunda Femoral Vein: No evidence of thrombus. Normal compressibility and flow on color Doppler imaging. Femoral Vein: No evidence of thrombus. Normal compressibility, respiratory phasicity and response to augmentation. Popliteal Vein: No evidence of thrombus. Normal compressibility, respiratory phasicity and response to augmentation. Calf Veins: Peroneal and posterior tibial veins demonstrate regions of wall thickening and non compressibility with flow maintained, compatible with chronic thrombus. Superficial Great Saphenous Vein: No evidence of thrombus. Normal compressibility and flow on color Doppler imaging. Other Findings:  None. LEFT  LOWER EXTREMITY Common Femoral Vein: No evidence of thrombus. Normal compressibility, respiratory phasicity and response to augmentation. Saphenofemoral Junction: No evidence of thrombus. Normal compressibility and flow on color Doppler imaging. Profunda Femoral Vein: No evidence of thrombus. Normal compressibility and flow on color Doppler imaging. Femoral Vein: No evidence of thrombus. Normal compressibility, respiratory phasicity and response to augmentation. Popliteal Vein: No evidence of thrombus. Normal compressibility, respiratory phasicity and response to augmentation. Calf Veins: Peroneal and posterior tibial veins demonstrate regions of wall thickening and in compressibility with flow maintained compatible with chronic thrombus. Superficial Great Saphenous Vein: No evidence of thrombus. Normal compressibility and flow on color Doppler imaging. Other Findings:  None. IMPRESSION: Sonographic survey of the bilateral lower extremities negative for acute DVT. 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 Electronically Signed   By: Corrie Mckusick D.O.   On: 06/01/2018 07:56   Ir Radiologist Eval & Mgmt  Result Date: 06/01/2018 Please refer to notes tab for details about interventional procedure. (Op Note)  Dg Knee 3 Views Left  Result Date: 06/07/2018 CLINICAL DATA:  Left knee pain for several weeks without known injury. EXAM: LEFT KNEE - 3 VIEW COMPARISON:  None. FINDINGS: No evidence of fracture, dislocation, or joint effusion. Moderate narrowing of medial joint space is noted. Soft tissues are unremarkable. IMPRESSION: Moderate degenerative joint disease is noted medially. No acute abnormality seen in the left knee. Electronically Signed   By: Marijo Conception, M.D.   On: 06/07/2018 14:30   Dg Knee 3 View Right  Result Date: 06/07/2018 CLINICAL DATA:  Right knee pain for several weeks without known injury. EXAM: RIGHT KNEE - 3 VIEW COMPARISON:  None. FINDINGS: No fracture or dislocation is  noted. Mild suprapatellar joint effusion is noted. Mild patellar spurring is noted. Moderate narrowing of medial joint space is noted with osteophyte formation. IMPRESSION: Mild suprapatellar joint effusion. Moderate degenerative joint disease is noted medially. No fracture or dislocation is noted. Electronically Signed   By: Marijo Conception, M.D.   On: 06/07/2018 14:31     CBC Recent Labs  Lab 06/07/18 0021 06/07/18 0408 06/08/18 0924 06/09/18 0934  WBC 7.7 8.1 10.1 16.4*  HGB 9.7* 8.4* 8.6* 9.2*  HCT 32.3* 27.2* 28.4* 29.6*  PLT 203 208 256 309  MCV 92.3 91.9 90.7 89.4  MCH 27.7 28.4 27.5 27.8  MCHC 30.0 30.9 30.3 31.1  RDW 14.1 14.2 14.4 13.7  LYMPHSABS 2.7  --   --   --   MONOABS 0.9  --   --   --   EOSABS 0.1  --   --   --   BASOSABS 0.0  --   --   --     Chemistries  Recent Labs  Lab 06/07/18 0021 06/07/18 0408  NA 137 137  K 4.0 3.7  CL 103 102  CO2 24 23  GLUCOSE 91 89  BUN  9 9  CREATININE 0.97 0.97  CALCIUM 9.1 8.9  AST 26  --   ALT 17  --   ALKPHOS 71  --   BILITOT 0.8  --    ------------------------------------------------------------------------------------------------------------------ No results for input(s): CHOL, HDL, LDLCALC, TRIG, CHOLHDL, LDLDIRECT in the last 72 hours.  No results found for: HGBA1C ------------------------------------------------------------------------------------------------------------------ No results for input(s): TSH, T4TOTAL, T3FREE, THYROIDAB in the last 72 hours.  Invalid input(s): FREET3 ------------------------------------------------------------------------------------------------------------------ No results for input(s): VITAMINB12, FOLATE, FERRITIN, TIBC, IRON, RETICCTPCT in the last 72 hours.  Coagulation profile Recent Labs  Lab 06/07/18 0021  INR 1.37    No results for input(s): DDIMER in the last 72 hours.  Cardiac Enzymes No results for input(s): CKMB, TROPONINI, MYOGLOBIN in the last 168  hours.  Invalid input(s): CK ------------------------------------------------------------------------------------------------------------------ No results found for: BNP   Roxan Hockey M.D on 06/09/2018 at 5:39 PM  Pager---[phone] Go to [email or web address] - password [email or web address] for contact info  [facility] - Office  [phone]
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
## 2018-06-09 NOTE — Progress Notes (Addendum)
ANTICOAGULATION CONSULT NOTE  Pharmacy Consult for Heparin (Apixaban on hold) Indication: pulmonary embolus history  Assessment: 52 y/o M with recent hx of bilateral PE/DVT, on apixaban PTA, holding apixaban and starting heparin, last dose apixaban >12 hours ago per pt  PTT therapeutic CBC stable  Goal of Therapy:  Heparin level 0.3-0.7 units/ml aPTT 66-102 seconds Monitor platelets by anticoagulation protocol: Yes   Plan:  Continue heparin drip at 1500 units / hr Daily CBC/HL/aPTT Monitor for bleeding  Follow up plans for Eliquis   Allergies  Allergen Reactions  . Nsaids Other (See Comments)    G6PD  . Sulfa Antibiotics Other (See Comments)    G6PD    Patient Measurements: Height: 5\' 9"  (175.3 cm) Weight: 272 lb 14.9 oz (123.8 kg) IBW/kg (Calculated) : 70.7  Vital Signs: Temp: 97.7 F (36.5 C) (09/27 0740) Temp Source: Oral (09/27 0740) BP: 134/94 (09/27 0740) Pulse Rate: 73 (09/27 0740)  Labs: Recent Labs    06/07/18 0021 06/07/18 0408 06/07/18 1029 06/08/18 0924 06/09/18 0934  HGB 9.7* 8.4*  --  8.6* 9.2*  HCT 32.3* 27.2*  --  28.4* 29.6*  PLT 203 208  --  256 309  APTT  --   --  83* 77* 84*  LABPROT 16.7*  --   --   --   --   INR 1.37  --   --   --   --   HEPARINUNFRC  --   --  1.02* 0.88* 0.83*  CREATININE 0.97 0.97  --   --   --     Estimated Creatinine Clearance: 115.8 mL/min (by C-G formula based on SCr of 0.97 mg/dL).      Thank you Okey Regal, PharmD [phone]  06/09/2018,10:46 AM

## 2018-06-09 NOTE — Procedures (Signed)
Procedure: Right knee aspiration and injection  Indication: Right knee effusion(s)  Surgeon: Charma Igo, PA-C  Assist: None  Anesthesia: None  EBL: None  Complications: None  Findings: After risks/benefits explained patient desires to undergo procedure. Consent obtained and time out performed. The right knee was sterilely prepped and aspirated. 5ml cloudy yellow fluid aspirated. Pt tolerated the procedure well.    Freeman Caldron, PA-C Orthopedic Surgery [phone]

## 2018-06-09 NOTE — Care Management Note (Signed)
Case Management Note  Patient Details  Name: [name] [name] MRN: [id number] Date of Birth: 12-27-1965  Subjective/Objective:    Pt admitted with fever. He is from home with parent.                 Action/Plan: ID following for potential infection and Neurosurgery following for potential surgery. CM following for d/c disposition.  Expected Discharge Date:                  Expected Discharge Plan:  Home/Self Care  In-House Referral:     Discharge planning Services     Post Acute Care Choice:    Choice offered to:     DME Arranged:    DME Agency:     HH Arranged:    HH Agency:     Status of Service:  In process, will continue to follow  If discussed at Long Length of Stay Meetings, dates discussed:    Additional Comments:  Kermit Balo, RN 06/09/2018, 3:03 PM

## 2018-06-10 LAB — HEPARIN LEVEL (UNFRACTIONATED)
HEPARIN UNFRACTIONATED: 0.91 [IU]/mL — AB (ref 0.30–0.70)
HEPARIN UNFRACTIONATED: 0.88 [IU]/mL — AB (ref 0.30–0.70)

## 2018-06-10 LAB — CBC
HCT: 27.1 % — ABNORMAL LOW (ref 39.0–52.0)
Hemoglobin: 8.5 g/dL — ABNORMAL LOW (ref 13.0–17.0)
MCH: 28.1 pg (ref 26.0–34.0)
MCHC: 31.4 g/dL (ref 30.0–36.0)
MCV: 89.4 fL (ref 78.0–100.0)
PLATELETS: 331 10*3/uL (ref 150–400)
RBC: 3.03 MIL/uL — AB (ref 4.22–5.81)
RDW: 13.7 % (ref 11.5–15.5)
WBC: 16.6 10*3/uL — AB (ref 4.0–10.5)

## 2018-06-10 LAB — APTT
APTT: 111 s — AB (ref 24–36)
aPTT: 101 seconds — ABNORMAL HIGH (ref 24–36)

## 2018-06-10 LAB — ALDOLASE: ALDOLASE: 5.5 U/L (ref 3.3–10.3)

## 2018-06-10 NOTE — Progress Notes (Signed)
ANTICOAGULATION CONSULT NOTE  Pharmacy Consult for Heparin (Apixaban on hold) Indication: pulmonary embolus history  Assessment: 52 y/o M with recent hx of bilateral PE/DVT, on apixaban PTA, holding apixaban , now on IV heparin, pending decision on further procedure.   PTT = 111, supratherapeutic.   Goal of Therapy:  Heparin level 0.3-0.7 units/ml aPTT 66-102 seconds Monitor platelets by anticoagulation protocol: Yes   Plan:  Decrease heparin rate to 1400 units / hr Daily CBC/HL/aPTT Monitor for bleeding  Follow up plans for Eliquis   Allergies  Allergen Reactions  . Nsaids Other (See Comments)    G6PD  . Sulfa Antibiotics Other (See Comments)    G6PD    Patient Measurements: Height: 5\' 9"  (175.3 cm) Weight: 272 lb 14.9 oz (123.8 kg) IBW/kg (Calculated) : 70.7  Vital Signs: Temp: 97.5 F (36.4 C) (09/28 0410) Temp Source: Oral (09/28 0410) BP: 122/84 (09/28 0410) Pulse Rate: 58 (09/28 0410)  Labs: Recent Labs    06/08/18 0924 06/09/18 0934 06/09/18 1409 06/10/18 0423  HGB 8.6* 9.2*  --  8.5*  HCT 28.4* 29.6*  --  27.1*  PLT 256 309  --  331  APTT 77* 84*  --  111*  HEPARINUNFRC 0.88* 0.83*  --  0.91*  CKTOTAL  --   --  60  --     Estimated Creatinine Clearance: 115.8 mL/min (by C-G formula based on SCr of 0.97 mg/dL).   Bayard Hugger, PharmD, BCPS, BCPPS Clinical Pharmacist  Pager: [phone]     06/10/2018,7:07 AM

## 2018-06-10 NOTE — Progress Notes (Signed)
ANTICOAGULATION CONSULT NOTE  Pharmacy Consult for Heparin (Apixaban on hold) Indication: pulmonary embolus history  Assessment: 52 y/o M with recent hx of bilateral PE/DVT, on apixaban PTA, holding apixaban , now on IV heparin, pending decision on further procedure. HL this afternoon is slightly supratherapeutic at 0.88.   Heparin dosing Weight: 102 kg    Goal of Therapy:  Heparin level 0.3-0.7 units/ml aPTT 66-102 seconds Monitor platelets by anticoagulation protocol: Yes   Plan:  Decrease heparin rate to 1250 units / hr F/u  Monitor for bleeding  Follow up plans for Eliquis   Allergies  Allergen Reactions  . Nsaids Other (See Comments)    G6PD  . Sulfa Antibiotics Other (See Comments)    G6PD    Patient Measurements: Height: 5\' 9"  (175.3 cm) Weight: 272 lb 14.9 oz (123.8 kg) IBW/kg (Calculated) : 70.7  Vital Signs: Temp: 98.6 F (37 C) (09/28 1635) Temp Source: Oral (09/28 1635) BP: 118/77 (09/28 1635) Pulse Rate: 81 (09/28 1635)  Labs: Recent Labs    06/08/18 0924 06/09/18 0934 06/09/18 1409 06/10/18 0423 06/10/18 1420  HGB 8.6* 9.2*  --  8.5*  --   HCT 28.4* 29.6*  --  27.1*  --   PLT 256 309  --  331  --   APTT 77* 84*  --  111*  --   HEPARINUNFRC 0.88* 0.83*  --  0.91* 0.88*  CKTOTAL  --   --  60  --   --     Estimated Creatinine Clearance: 115.8 mL/min (by C-G formula based on SCr of 0.97 mg/dL).   Vinnie Level, PharmD., BCPS Clinical Pharmacist Clinical phone for 06/10/18 until 8:30pm: [phone]

## 2018-06-10 NOTE — Progress Notes (Signed)
Neurosurgery Progress Note  No issues overnight. Continues to endorse generalized weakness. Pain essentially stable  EXAM:  BP (!) 128/94 (BP Location: Right Arm)   Pulse 78   Temp 98.5 F (36.9 C) (Oral)   Resp 18   Ht 5\' 9"  (1.753 m)   Wt 123.8 kg   SpO2 100%   BMI 40.30 kg/m   Awake, alert, oriented  Speech fluent, appropriate  CN grossly intact  MAEW with good strength, mild left shoulder weakness and right knee weakness secondary to pain  IMPRESSION/PLAN Neurologically stable this am. Right knee aspirated by Ortho yesterday.  Rheum consulted by primary service. Plan is to proceed with posterior cervical decompression once we have a more clear understanding for elevated markers and r/o infection. Awaiting final cx results

## 2018-06-10 NOTE — Progress Notes (Signed)
[facility] PROGRESS NOTE  Mackinley Plascencia ZYS:[id number] DOB: 12-30-1965 DOA: 06/06/2018 PCP: Helane Rima, MD   Narrative: 52 year old male ACDF C3-C5 Dr. Saintclair Halsted 03/2018-pulmonary embolism at that time followed by Dr. Irene Limbo of oncology-this was an upper extremity DVT-discharge from rehab 8/20 after extended stay Known left posterior meniscal horn tear left knee, pernicious anemia, TCP, HTN, neurogenic bowel/bladder, gout Sent home with cervical collar Bledsoe brace  Readmitted to hospital from [facility] gone to ED 05/09/2018 with increasing weakness, possible pneumonia He will come back to [facility] for?  IVC filter as he has a DVT as above and reached his home in [place], bilateral weakness paresthesias and increasing difficulty ambulating  Was directly admitted to having fevers for the past 2 months MRI spine did not show any evidence of epidural hemorrhage or abscess on admission BUN/creatinine slightly elevated on admission 1.01   A & Plan  1)Persistent Fevers and Elevated inflammatory markers--- T-max 100.7, ESR over 140, CRP over 20, now has leukocytosis (had decadron on 06/07/18), infectious versus inflammatory etiology, cultures, chest x-rays, MRIs all negative- Infectious disease consult/input appreciated,  x-rays of both knees reveals mild effusion on the right knee otherwise no acute findings , Bil DJD noted, Rt Knee joint aspirate from 06/09/2018 without evidence of infection/septic arthritis. Dr. Denton Brick D/w Dr Lahoma [facility] rheumatology), his input is appreciated..... (cell phone--[phone])- prednisone 60 mg x 3 days- 2)S/p ACDF at C3- C5  On 03/24/18--- Dr Saintclair Halsted--- as per neurosurgical attending  Dr. Saintclair Halsted, imaging studies including cervical and lumbar MRI studies and clinical exam does not support any acute postop complications from his recent neurosurgical procedure,  3)Bil Knee Pain/Swelling- Rt > Lt-- ????  Infectious versus  inflammatory arthritis of the right knee--- RT Knee aspiration by orthopedic, Rt Knee joint aspirate from 06/09/2018  + for gout and pseudogout.   4)HTN--stable, stopped HCTZ 2/2 gout use amlodipine 2.5 mg daily instead  5)Polymyalgia and Polyarthralgia----total CK is only 60, patient did not have a leukocytosis until he received Decadron, f? inflammatory versus infectious etiology, CRP is 18, ANCA is negative, aldolase is negative so far, CCP is pending, CK is negative, ANA is negative  6)Recent PE--- continue IV heparin until determination has been made that no further investigation/procedure needs to be done,  then may switch back to apixaban  7)Gout-patient has had gout for over 30 years he tells me, uric acid levels is actually not elevated at this time, right knee fluid aspirate suggest gout and pseudogout, prednisone 60 mg daily x3 days as above #1,   8)L shoulder pain-exam consistent more with DJD and Bicipital tendinitis-OT to eval for the same  9) Le swelling-placing TED hose   No family  DVT prophylaxis: Lovenox code Status: Full family Communication: discussed with friend at [facility]--all q's answered disposition Plan: Inpatient   Verlon Au, MD  [facility] Direct contact: [phone] --Via amion app OR  --[email or web address]; password [email or web address]  7PM-7AM contact night coverage as above 06/10/2018, 7:04 AM  LOS: 3 days   Consultants:  ID  Neurosurgery    Procedures:  Multiple  Antimicrobials:  none  Interval history/Subjective: Awake alert pleasant in nad Some discomfort L shoulders No fevers Overall better since initiation of steroids  Objective:  Vitals:  Vitals:   06/09/18 2303 06/10/18 0410  BP: 123/82 122/84  Pulse: 71 (!) 58  Resp: 20 20  Temp: 98.2 F (36.8 C) (!) 97.5 F (36.4 C)  SpO2: 98% 100%    Exam:  .  eomi  thick neck, Mallamptatti 2 . No ict no pallor . cta b . neer and hawkins sign + L shoulder with restricted ROM . Power  5/5 upper and LE but weaker in LE>UE . Sensory intact--clonus + . Mild swelling feet   I have personally reviewed the following:   Labs:  WBC 16.6, hemoglobin 8.5, platelet 331, heparin 0.9, APTT 111  Imaging studies:  None today  Medical tests:  No  Test discussed with performing physician:  No  Decision to obtain old records:  No  Review and summation of old records:  No  Scheduled Meds: . amLODipine  2.5 mg Oral Daily  . febuxostat  40 mg Oral Daily  . gabapentin  300 mg Oral TID  . pantoprazole  40 mg Oral BID  . polyethylene glycol  17 g Oral BID  . predniSONE  60 mg Oral Q breakfast   Continuous Infusions: . heparin 1,500 Units/hr (06/10/18 0252)  . methocarbamol (ROBAXIN) IV 500 mg (06/09/18 1000)    Principal Problem:   Fever Active Problems:   Benign essential HTN   Cord compression (HCC)   History of pulmonary embolism   LOS: 3 days

## 2018-06-11 LAB — CBC
HEMATOCRIT: 27.9 % — AB (ref 39.0–52.0)
HEMOGLOBIN: 8.6 g/dL — AB (ref 13.0–17.0)
MCH: 27.7 pg (ref 26.0–34.0)
MCHC: 30.8 g/dL (ref 30.0–36.0)
MCV: 89.7 fL (ref 78.0–100.0)
Platelets: 344 10*3/uL (ref 150–400)
RBC: 3.11 MIL/uL — ABNORMAL LOW (ref 4.22–5.81)
RDW: 14 % (ref 11.5–15.5)
WBC: 16.6 10*3/uL — ABNORMAL HIGH (ref 4.0–10.5)

## 2018-06-11 LAB — HEPARIN LEVEL (UNFRACTIONATED)
HEPARIN UNFRACTIONATED: 0.9 [IU]/mL — AB (ref 0.30–0.70)
HEPARIN UNFRACTIONATED: 0.83 [IU]/mL — AB (ref 0.30–0.70)
Heparin Unfractionated: 0.59 IU/mL (ref 0.30–0.70)

## 2018-06-11 LAB — APTT: aPTT: 110 seconds — ABNORMAL HIGH (ref 24–36)

## 2018-06-11 LAB — CYCLIC CITRUL PEPTIDE ANTIBODY, IGG/IGA: CCP ANTIBODIES IGG/IGA: 20 U — AB (ref 0–19)

## 2018-06-11 NOTE — Evaluation (Signed)
Occupational Therapy Evaluation Patient Details Name: [name] [name] MRN: [id number] DOB: 1965-10-13 Today's Date: 06/11/2018    History of Present Illness 52 y.o. male with recent ACDF at C3 C5 by Dr. Wynetta Emery in July 2019 complicated by pulmonary embolism was placed on apixaban subsequently discharged to rehab and then SNF. PMH including gout, HTN, and pulmonary embolism. Pt presenting post fall after his legs gave away after getting out of the car. MRI of cervical and lumbar spine showing no change or infection per Dr. Wynetta Emery. MR of right knee showing complex tear of the posterior horn of the medial meniscus. Right knee aspirated on 06/09/18.     Clinical Impression   PTA, pt has recently discharged from CIR and then SNF to his parent's home and reports he was performing ADLs with AE and functional mobility with RW. Pt currently requiring Max A for LB ADLs and Mod-Max A for functional transfers with RW. During functional mobility, pt unable to weight bear through right knee (with knee brace on) without it buckling. Pt highly motivated to return to PLOF and verbalized frustration about the current situation with his knee. Pt will require further acute OT to facilitate safe dc. Recommend dc to CIR for further OT to optimize safety, independence with ADLs, and return to PLOF.      Follow Up Recommendations  CIR;Supervision/Assistance - 24 hour    Equipment Recommendations  Other (comment)(Defer to next venue)    Recommendations for Other Services Rehab consult;PT consult     Precautions / Restrictions Precautions Precautions: Fall Precaution Comments: Pt reports he has been cleared for cervical precautions and collar. Required Braces or Orthoses: Other Brace/Splint Other Brace/Splint: Right hinged knee brace. Restrictions Other Position/Activity Restrictions: Pt unable to bear weight through right knee      Mobility Bed Mobility Overal bed mobility: Needs Assistance Bed Mobility:  Rolling;Sidelying to Sit Rolling: Supervision Sidelying to sit: Supervision       General bed mobility comments: Supervision for safety. Pt using log roll technique. Increased time due to difficulty moving RLE  Transfers Overall transfer level: Needs assistance Equipment used: Rolling walker (2 wheeled) Transfers: Sit to/from [employer] Sit to Stand: Max assist Stand pivot transfers: Mod assist       General transfer comment: Max A to power up into standing. Pt unable to weight bear through right knee. During pivot to recliner pt havign to slide his left foot over since he was unabel to weight shift to right and maintain standing.     Balance Overall balance assessment: Needs assistance Sitting-balance support: No upper extremity supported;Feet supported Sitting balance-Leahy Scale: Good     Standing balance support: Bilateral upper extremity supported;During functional activity Standing balance-Leahy Scale: Poor Standing balance comment: Unable to maintain standing without UE support                           ADL either performed or assessed with clinical judgement   ADL Overall ADL's : Needs assistance/impaired Eating/Feeding: Set up;Supervision/ safety;Sitting   Grooming: Set up;Supervision/safety;Sitting   Upper Body Bathing: Set up;Supervision/ safety;Sitting   Lower Body Bathing: Moderate assistance;Sit to/from stand;Maximal assistance;Sitting/lateral leans Lower Body Bathing Details (indicate cue type and reason): Pt requiring Max A to power up into standing and demonstrating good transfer techniques with weight shift and hand position.  Upper Body Dressing : Supervision/safety;Set up;Sitting Upper Body Dressing Details (indicate cue type and reason): Donning second gown like jacket while seated at  EOB Lower Body Dressing: Maximal assistance;Sit to/from stand Lower Body Dressing Details (indicate cue type and reason): Max A to don socks  and knee brace. Max A for sit<>stand Toilet Transfer: Maximal assistance;Stand-pivot;RW;Moderate assistance(Simulated to recliner) Toilet Transfer Details (indicate cue type and reason): Pt unable to maintain weight through right knee without it buckling (while wearing brace). Pt is at a high risk to fall due to limitations.          Functional mobility during ADLs: Maximal assistance;Rolling walker General ADL Comments: Pt highly motivated. Pt's main limitation is maintaining weight throughout right knee. During functional transfers and standing, pt unable to weight shift to RLE due to buckling.      Vision         Perception     Praxis      Pertinent Vitals/Pain Pain Assessment: Faces Faces Pain Scale: Hurts little more Pain Location: Right knee Pain Descriptors / Indicators: Discomfort;Grimacing Pain Intervention(s): Monitored during session;Repositioned     Hand Dominance Right   Extremity/Trunk Assessment Upper Extremity Assessment Upper Extremity Assessment: Overall WFL for tasks assessed(Reports bilateral shoulder pain due to swelling.)   Lower Extremity Assessment Lower Extremity Assessment: RLE deficits/detail RLE Deficits / Details: Pt able to perform ROM at knee. However, unable to weight bear through right knee without it buckling. Pt attempting and highly motivated. RLE: Unable to fully assess due to immobilization(hinged knee brace set at 10-120) RLE Coordination: decreased gross motor   Cervical / Trunk Assessment Cervical / Trunk Assessment: Normal;Other exceptions Cervical / Trunk Exceptions: prior cervical surgery   Communication Communication Communication: No difficulties   Cognition Arousal/Alertness: Awake/alert Behavior During Therapy: WFL for tasks assessed/performed Overall Cognitive Status: Within Functional Limits for tasks assessed                                 General Comments: Pt agreeable to therapy and able to provide  full story of situaion since accident. Pt verbalizing frustration with current situation.   General Comments  Close friend in room throughout session    Exercises Exercises: Other exercises Other Exercises Other Exercises: Educating pt on BUE ROM for bilateral shoulder pain and edema. Other Exercises: Educating pt on chair push ups for functional exercises to increase strength and optimize transfer safety   Shoulder Instructions      Home Living Family/patient expects to be discharged to:: Inpatient rehab Living Arrangements: Parent                               Additional Comments: Pt has been to CIR and SNF, then dicharges home with his parents. He states that he will real the goals at therapy and walk the distance, but his knee continued to buckle.       Prior Functioning/Environment Level of Independence: Needs assistance  Gait / Transfers Assistance Needed: Since rehab and accident, has been using RW and knee brace ADL's / Homemaking Assistance Needed: Using AE for sock/shoe management. Performs ADLs independently. Family performs IADLs.   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         OT Problem List: Decreased strength;Decreased activity tolerance;Impaired balance (sitting and/or standing);Pain      OT Treatment/Interventions: Self-care/ADL training;Therapeutic exercise;Energy conservation;DME and/or AE instruction;Therapeutic activities;Patient/family education    OT Goals(Current goals can be found in the care plan section)  Acute Rehab OT Goals Patient Stated Goal: "Find out why my knee keeps buckling" OT Goal Formulation: With patient Time For Goal Achievement: 06/25/18 Potential to Achieve Goals: Good ADL Goals Pt Will Perform Grooming: with min guard assist;standing Pt Will Perform Lower Body Dressing: with min guard assist;sit to/from stand;with adaptive equipment Pt Will Transfer to Toilet: with min guard  assist;ambulating;bedside commode Pt Will Perform Toileting - Clothing Manipulation and hygiene: with min guard assist;sit to/from stand;sitting/lateral leans  OT Frequency: Min 3X/week   Barriers to D/C:            Co-evaluation              AM-PAC PT "6 Clicks" Daily Activity     Outcome Measure Help from another person eating meals?: None Help from another person taking care of personal grooming?: None Help from another person toileting, which includes using toliet, bedpan, or urinal?: A Lot Help from another person bathing (including washing, rinsing, drying)?: A Lot Help from another person to put on and taking off regular upper body clothing?: A Little Help from another person to put on and taking off regular lower body clothing?: A Lot 6 Click Score: 17   End of Session Equipment Utilized During Treatment: Rolling walker;Other (comment)(Right knee brace) Nurse Communication: Mobility status;Weight bearing status  Activity Tolerance: Patient tolerated treatment well;Other (comment)(Unable to bear weight through right knee) Patient left: in chair;with call bell/phone within reach;with family/visitor present  OT Visit Diagnosis: Unsteadiness on feet (R26.81);Other abnormalities of gait and mobility (R26.89);Muscle weakness (generalized) (M62.81);Pain Pain - Right/Left: Right Pain - part of body: Knee                Time: 1535-1611 OT Time Calculation (min): 36 min Charges:  OT General Charges $OT Visit: 1 Visit OT Evaluation $OT Eval Moderate Complexity: 1 Mod OT Treatments $Self Care/Home Management : 8-22 mins  Rhilynn Preyer MSOT, OTR/L Acute Rehab Pager: [phone] Office: [phone]  Theodoro Grist Amalee Olsen 06/11/2018, 4:48 PM

## 2018-06-11 NOTE — Progress Notes (Signed)
[facility] PROGRESS NOTE  [name] [name] LOV:[id number] DOB: 1965-10-06 DOA: 06/06/2018 PCP: Helane Rima, MD   Narrative: 52 year old male ACDF C3-C5 Dr. Saintclair Halsted 03/2018-pulmonary embolism at that time followed by Dr. Irene Limbo of oncology-this was an upper extremity DVT-discharge from rehab 8/20 after extended stay Known left posterior meniscal horn tear left knee, pernicious anemia, TCP, HTN, neurogenic bowel/bladder, gout Sent home with cervical collar Bledsoe brace  Readmitted to hospital from [facility] gone to ED 05/09/2018 with increasing weakness, possible pneumonia Returned to [facility] for?  IVC filter as he has a DVT as above and reached his home in [place], bilateral weakness paresthesias and increasing difficulty ambulating  Was directly admitted to having fevers for the past 2 months MRI spine did not show any evidence of epidural hemorrhage or abscess on admission BUN/creatinine slightly elevated on admission 1.01   A & Plan  1)Persistent Fevers and Elevated inflammatory markers--- T-max 100.7, ESR over 140, CRP over 20, now has leukocytosis (had decadron on 06/07/18), infectious versus inflammatory etiology, cultures, chest x-rays, MRIs all negative- Infectious disease consult/input appreciated,  x-rays of both knees reveals mild effusion on the right knee otherwise no acute findings , Bil DJD noted, Rt Knee joint aspirate from 06/09/2018 without evidence of infection/septic arthritis. Dr. Denton Brick D/w Dr Lahoma Rocker [facility] rheumatology), his input is appreciated..... (cell phone--[phone])- prednisone 60 mg x 3 days-would stop therapy on 9/30 and discussed with rheumatologist at that time  2)S/p ACDF at C3- C5  On 03/24/18--- Dr Saintclair Halsted--- as per neurosurgical attending  Dr. Saintclair Halsted, imaging studies i do not support any complications-I discussed with [name] [name] this a.m. and he indicates Dr. Saintclair Halsted will follow for eventual revision of surgery in the next  week  3)Bil Knee Pain/Swelling- Rt > Lt--patient has had gout since the age of 1 Pathognomic signs for him are swelling of left toe Synovial fluid confirms crystal-suspect fevers are related to the same as above  4)HTN--stable, stopped HCTZ 2/2 gout use amlodipine 2.5 mg daily instead-currently stable  5)Polymyalgia and Polyarthralgia----total CK is only 60, patient did not have a leukocytosis until he received Decadron, f? inflammatory versus infectious etiology, CRP is 18, ANCA is negative, aldolase is negative so far, CCP is weakly positive CK is negative, ANA is negative It is unlikely that in the setting of a weakly positive CCP with negative other biomarkers that this is RA I will discuss with Dr.  Sue Lush PE--- continue IV heparin --? switch back to apixaban once okay per NS  7)Gout-patient has had gout for over 30 years he tells me, uric acid levels is actually not elevated at this time, right knee fluid aspirate suggest gout and pseudogout, prednisone 60 mg daily x3 days as above #1  8)L shoulder pain-exam consistent more with DJD and Bicipital tendinitis-OT to eval for the same  9) Le swelling-placing TED hose   No family  DVT prophylaxis: Lovenox code Status: Full family Communication: discussed with friend at [facility]--all q's answered disposition Plan: Inpatient   [name] Au, MD  [facility] Direct contact: [phone] --Via amion app OR  --[email or web address]; password [email or web address]  7PM-7AM contact night coverage as above 06/11/2018, 7:13 AM  LOS: 4 days   Consultants:  ID  Neurosurgery    Procedures:  Multiple  Antimicrobials:  none  Interval history/Subjective:  Awake alert pleasant feels better No fever no chills Overall feels 50% of his normal mobility but has not been up with therapy Stockings are pending Still feels swollen in lower extremities  bilaterally   Objective:  Vitals:  Vitals:   06/11/18 0016 06/11/18 0342  BP: 137/84 123/86   Pulse: 65 (!) 52  Resp: 20 18  Temp: 98.2 F (36.8 C) 97.6 F (36.4 C)  SpO2: 100% 96%    Exam:  . Awake alert pleasant no distress . EOMI NCAT . Chest clear . Left lower extremity is more swollen than the right but both look equally swollen it is not tender to touch . Abdomen is soft nontender . Chest is clear . Neurologically he is able to move all 4 limbs without issues   I have personally reviewed the following:   Labs: WBC 16.6, hemoglobin 8.6   Imaging studies:  None today  Medical tests:  No  Test discussed with performing physician:  No  Decision to obtain old records:  No  Review and summation of old records:  No  Scheduled Meds: . amLODipine  2.5 mg Oral Daily  . febuxostat  40 mg Oral Daily  . gabapentin  300 mg Oral TID  . pantoprazole  40 mg Oral BID  . polyethylene glycol  17 g Oral BID  . predniSONE  60 mg Oral Q breakfast   Continuous Infusions: . heparin 1,050 Units/hr (06/11/18 0227)  . methocarbamol (ROBAXIN) IV 500 mg (06/10/18 1839)    Principal Problem:   Fever Active Problems:   Benign essential HTN   Cord compression (HCC)   History of pulmonary embolism   LOS: 4 days

## 2018-06-11 NOTE — Progress Notes (Signed)
ANTICOAGULATION CONSULT NOTE  Pharmacy Consult for Heparin (Apixaban on hold) Indication: pulmonary embolus history  Assessment: 52 y/o M with recent hx of bilateral PE/DVT, on apixaban PTA, holding apixaban , now on IV heparin, pending decision on further procedure.   Heparin level is now therapeutic at 0.59 on 850 units/hr. Hgb and plts stable, no reports of bleeding.   Goal of Therapy:  Heparin level 0.3-0.7 units/ml Monitor platelets by anticoagulation protocol: Yes   Plan:  Continue heparin rate at 850 units / hr F/u 6h heparin level at midnight  Monitor daily CBC, heparin level, s/sx bleeding Follow up plans for Eliquis   Allergies  Allergen Reactions  . Nsaids Other (See Comments)    G6PD  . Sulfa Antibiotics Other (See Comments)    G6PD    Patient Measurements: Height: 5\' 9"  (175.3 cm) Weight: 272 lb 14.9 oz (123.8 kg) IBW/kg (Calculated) : 70.7  Heparin dosing weight: 99kg   Vital Signs: Temp: 98.2 F (36.8 C) (09/29 1302) Temp Source: Oral (09/29 1302) BP: 122/85 (09/29 1652) Pulse Rate: 73 (09/29 1652)  Labs: Recent Labs    06/09/18 0934 06/09/18 1409 06/10/18 0423 06/10/18 1420 06/11/18 0101 06/11/18 0848 06/11/18 1605  HGB 9.2*  --  8.5*  --  8.6*  --   --   HCT 29.6*  --  27.1*  --  27.9*  --   --   PLT 309  --  331  --  344  --   --   APTT 84*  --  111* 101* 110*  --   --   HEPARINUNFRC 0.83*  --  0.91* 0.88* 0.90* 0.83* 0.59  CKTOTAL  --  60  --   --   --   --   --     Estimated Creatinine Clearance: 115.8 mL/min (by C-G formula based on SCr of 0.97 mg/dL).  Vinnie Level, PharmD., BCPS Clinical Pharmacist Clinical phone for 06/11/18 until 8:30pm: [phone]

## 2018-06-11 NOTE — Progress Notes (Signed)
Neurosurgery Progress Note  No issues overnight.  Continued generalized weakness but improved with steroids Frustrated with feeling like he doesn't know what's going on  EXAM:  BP 123/86 (BP Location: Right Arm)   Pulse (!) 52   Temp 97.6 F (36.4 C) (Oral)   Resp 18   Ht 5\' 9"  (1.753 m)   Wt 123.8 kg   SpO2 96%   BMI 40.30 kg/m   Awake, alert, oriented  Speech fluent, appropriate  CN grossly intact  MAEW with good strength  PLAN Neurologically stable this am Cx negative to date Continue medical tx per primary team Dr Wynetta Emery to return tomorrow and determine when to proceed with surgery

## 2018-06-11 NOTE — Progress Notes (Signed)
ANTICOAGULATION CONSULT NOTE  Pharmacy Consult for Heparin (Apixaban on hold) Indication: pulmonary embolus history  Assessment: 52 y/o M with recent hx of bilateral PE/DVT, on apixaban PTA, holding apixaban , now on IV heparin, pending decision on further procedure.   Heparin level remains supratherapeutic at 0.83 on 1050 units/hr. Hgb and plts stable, no reports of bleeding.   Goal of Therapy:  Heparin level 0.3-0.7 units/ml Monitor platelets by anticoagulation protocol: Yes   Plan:  Decrease heparin rate to 850 units / hr F/u 6h heparin level at 1600 Monitor daily CBC, heparin level, s/sx bleeding Follow up plans for Eliquis   Allergies  Allergen Reactions  . Nsaids Other (See Comments)    G6PD  . Sulfa Antibiotics Other (See Comments)    G6PD    Patient Measurements: Height: 5\' 9"  (175.3 cm) Weight: 272 lb 14.9 oz (123.8 kg) IBW/kg (Calculated) : 70.7  Heparin dosing weight: 99kg   Vital Signs: Temp: 97.6 F (36.4 C) (09/29 0342) Temp Source: Oral (09/29 0342) BP: 123/86 (09/29 0342) Pulse Rate: 52 (09/29 0342)  Labs: Recent Labs    06/09/18 0934 06/09/18 1409 06/10/18 0423 06/10/18 1420 06/11/18 0101 06/11/18 0848  HGB 9.2*  --  8.5*  --  8.6*  --   HCT 29.6*  --  27.1*  --  27.9*  --   PLT 309  --  331  --  344  --   APTT 84*  --  111* 101* 110*  --   HEPARINUNFRC 0.83*  --  0.91* 0.88* 0.90* 0.83*  CKTOTAL  --  60  --   --   --   --     Estimated Creatinine Clearance: 115.8 mL/min (by C-G formula based on SCr of 0.97 mg/dL).  Lenward Chancellor, PharmD PGY1 Pharmacy Resident Phone [phone] 06/11/2018 9:55 AM

## 2018-06-11 NOTE — Progress Notes (Signed)
ANTICOAGULATION CONSULT NOTE  Pharmacy Consult for Heparin (Apixaban on hold) Indication: pulmonary embolus history  Assessment: 52 y/o M with recent hx of bilateral PE/DVT, on apixaban PTA, holding apixaban , now on IV heparin, pending decision on further procedure.   HL = 0.9, aPTT 110, both are supratherapeutic   Goal of Therapy:  Heparin level 0.3-0.7 units/ml aPTT 66-102 seconds Monitor platelets by anticoagulation protocol: Yes   Plan:  Decrease heparin rate to 1050 units / hr F/u 6h heparin level at 0830 Monitor for bleeding  Follow up plans for Eliquis   Allergies  Allergen Reactions  . Nsaids Other (See Comments)    G6PD  . Sulfa Antibiotics Other (See Comments)    G6PD    Patient Measurements: Height: 5\' 9"  (175.3 cm) Weight: 272 lb 14.9 oz (123.8 kg) IBW/kg (Calculated) : 70.7  Vital Signs: Temp: 98.2 F (36.8 C) (09/29 0016) Temp Source: Oral (09/29 0016) BP: 137/84 (09/29 0016) Pulse Rate: 65 (09/29 0016)  Labs: Recent Labs    06/09/18 0934 06/09/18 1409 06/10/18 0423 06/10/18 1420 06/11/18 0101  HGB 9.2*  --  8.5*  --  8.6*  HCT 29.6*  --  27.1*  --  27.9*  PLT 309  --  331  --  344  APTT 84*  --  111* 101* 110*  HEPARINUNFRC 0.83*  --  0.91* 0.88* 0.90*  CKTOTAL  --  60  --   --   --     Estimated Creatinine Clearance: 115.8 mL/min (by C-G formula based on SCr of 0.97 mg/dL).   Vinnie Level, PharmD., BCPS Clinical Pharmacist Clinical phone for 06/10/18 until 8:30pm: [phone]

## 2018-06-12 LAB — CBC
HEMATOCRIT: 29.4 % — AB (ref 39.0–52.0)
Hemoglobin: 8.9 g/dL — ABNORMAL LOW (ref 13.0–17.0)
MCH: 27.6 pg (ref 26.0–34.0)
MCHC: 30.3 g/dL (ref 30.0–36.0)
MCV: 91.3 fL (ref 78.0–100.0)
Platelets: 375 10*3/uL (ref 150–400)
RBC: 3.22 MIL/uL — ABNORMAL LOW (ref 4.22–5.81)
RDW: 14.1 % (ref 11.5–15.5)
WBC: 13.6 10*3/uL — AB (ref 4.0–10.5)

## 2018-06-12 LAB — COMPREHENSIVE METABOLIC PANEL
ALT: 27 U/L (ref 0–44)
AST: 32 U/L (ref 15–41)
Albumin: 2.6 g/dL — ABNORMAL LOW (ref 3.5–5.0)
Alkaline Phosphatase: 77 U/L (ref 38–126)
Anion gap: 9 (ref 5–15)
BILIRUBIN TOTAL: 0.4 mg/dL (ref 0.3–1.2)
BUN: 21 mg/dL — AB (ref 6–20)
CO2: 24 mmol/L (ref 22–32)
CREATININE: 0.97 mg/dL (ref 0.61–1.24)
Calcium: 8.9 mg/dL (ref 8.9–10.3)
Chloride: 106 mmol/L (ref 98–111)
Glucose, Bld: 198 mg/dL — ABNORMAL HIGH (ref 70–99)
POTASSIUM: 3.9 mmol/L (ref 3.5–5.1)
Sodium: 139 mmol/L (ref 135–145)
TOTAL PROTEIN: 6.1 g/dL — AB (ref 6.5–8.1)

## 2018-06-12 LAB — CULTURE, BLOOD (ROUTINE X 2)
Culture: NO GROWTH
Culture: NO GROWTH

## 2018-06-12 LAB — HEPARIN LEVEL (UNFRACTIONATED): HEPARIN UNFRACTIONATED: 0.4 [IU]/mL (ref 0.30–0.70)

## 2018-06-12 LAB — BODY FLUID CULTURE: CULTURE: NO GROWTH

## 2018-06-12 MED ORDER — PREDNISONE 20 MG PO TABS
40.0000 mg | ORAL_TABLET | Freq: Every day | ORAL | Status: DC
  Administered 2018-06-12: 40 mg via ORAL
  Filled 2018-06-12: qty 2

## 2018-06-12 NOTE — Care Management Note (Signed)
Case Management Note  Patient Details  Name: [name] [name] MRN: [id number] Date of Birth: [birth year]  Subjective/Objective:                    Action/Plan: Recommendations are for CIR. CM following for d/c disposition.   Expected Discharge Date:                  Expected Discharge Plan:  IP Rehab Facility  In-House Referral:     Discharge planning Services  CM Consult  Post Acute Care Choice:    Choice offered to:     DME Arranged:    DME Agency:     HH Arranged:    HH Agency:     Status of Service:  In process, will continue to follow  If discussed at Long Length of Stay Meetings, dates discussed:    Additional Comments:  Kermit Balo, RN 06/12/2018, 1:27 PM

## 2018-06-12 NOTE — Progress Notes (Signed)
ANTICOAGULATION CONSULT NOTE   Pharmacy Consult for Heparin (Apixaban on hold) Indication: pulmonary embolus  Allergies  Allergen Reactions  . Nsaids Other (See Comments)    G6PD  . Sulfa Antibiotics Other (See Comments)    G6PD    Patient Measurements: Height: 5\' 9"  (175.3 cm) Weight: 272 lb 14.9 oz (123.8 kg) IBW/kg (Calculated) : 70.7  Vital Signs: Temp: 98.4 F (36.9 C) (09/30 0000) Temp Source: Oral (09/30 0000) BP: 120/75 (09/30 0000) Pulse Rate: 63 (09/30 0000)  Labs: Recent Labs    06/09/18 1409 06/10/18 0423 06/10/18 1420 06/11/18 0101 06/11/18 0848 06/11/18 1605 06/12/18 0119  HGB  --  8.5*  --  8.6*  --   --  8.9*  HCT  --  27.1*  --  27.9*  --   --  29.4*  PLT  --  331  --  344  --   --  375  APTT  --  111* 101* 110*  --   --   --   HEPARINUNFRC  --  0.91* 0.88* 0.90* 0.83* 0.59 0.40  CKTOTAL 60  --   --   --   --   --   --     Estimated Creatinine Clearance: 115.8 mL/min (by C-G formula based on SCr of 0.97 mg/dL).   Medical History: Past Medical History:  Diagnosis Date  . Degenerative disc disease, lumbar   . Gout   . History of pernicious anemia 1980   Patient says he takes OTC iron supplements QD  . Hypertension     Assessment: 52 y/o M with recent hx of bilateral PE/DVT, on apixaban PTA, holding apixaban and starting heparin, last dose apixaban >12 hours ago per pt, OK to start heparin now, Hgb 9.7, plts good, renal function good.   9/30 AM update: heparin level therapeutic x 2 after rate decrease  Goal of Therapy:  Heparin level 0.3-0.7 units/mL Monitor platelets by anticoagulation protocol: Yes   Plan:  Cont heparin at 850 units/hr Daily CBC/HL Monitor for bleeding   Abran Duke 06/12/2018,1:41 AM

## 2018-06-12 NOTE — Progress Notes (Signed)
[facility] PROGRESS NOTE  Eddison Allred DPO:[id number] DOB: 1965-11-21 DOA: 06/06/2018 PCP: Helane Rima, MD  Narrative:  52 year old male ACDF C3-C5 Dr. Saintclair Halsted 03/2018---Had Ant Corpectomy complicated by Bil pulmonary embolisms post op--needed emergent hematoma evacuation POD#3-IVC filter placed 7/16 followed by Dr. Irene Limbo of oncology--discharge from rehab 8/20 after extended stay Known left posterior meniscal horn tear left knee, pernicious anemia, TCP, HTN, neurogenic bowel/bladder, gout Sent home with cervical collar Bledsoe brace  Readmitted to hospital from [facility] gone to ED 05/09/2018 with increasing weakness, possible pneumonia Returned to [place] , bilateral weakness paresthesias and increasing difficulty ambulating  Was directly admitted to having fevers for the past 2 months MRI spine did not show any evidence of epidural hemorrhage or abscess on admission BUN/creatinine slightly elevated on admission 1.01   A & Plan  1)Persistent Fevers and Elevated inflammatory markers--- T-max 100.7, ESR over 140, CRP over 20, now has leukocytosis (had decadron on 06/07/18), infectious versus inflammatory etiology, cultures, chest x-rays, MRIs all negative- Infectious disease consult/input appreciated,  x-rays of both knees reveals mild effusion on the right knee otherwise no acute findings , Bil DJD noted, Rt Knee joint aspirate from 06/09/2018 without evidence of infection/septic arthritis. Dr. Denton Brick D/w Dr Lahoma Rocker [facility] rheumatology), his input is appreciated..... (cell phone--[phone])- prednisone 60 mg x 3 days-and tapering per further input from Dr. Kathlene November Will need Peri-op stress dose steorids  2)S/p ACDF at C3- C5  On 03/24/18--- Dr Saintclair Halsted--- as per neurosurgical attending  Dr. Saintclair Halsted. Pulm clearance d/w Dr. Louretta Shorten with him and he states after review of data with me that patient is closer to risk of a normal person--may be reasonable to  stop during peri-op period but can resume when safe from NS perspective post-op  3)Bil Knee Pain/Swelling- Rt > Lt--patient has had gout since the age of 52 Pathognomic signs for him are swelling of left toe Synovial fluid confirms crystal-suspect fevers are related to the same as above  4)HTN--stable, stopped HCTZ 2/2 gout use amlodipine 2.5 mg daily instead-currently stable  5)Polymyalgia and Polyarthralgia----total CK is only 60, patient did not have a leukocytosis until he received Decadron, f? inflammatory versus infectious etiology, CRP is 18, ANCA is negative, aldolase is negative so far, CCP is weakly positive CK is negative, ANA is negative ? Sero-neg arthritis per my discussion with Dr. Aryal--further management as OP  6)Recent PE--- continue IV heparin --? switch back to apixaban once okay per NS  7)Gout-patient has had gout for over 30 years he tells me, uric acid levels is actually not elevated at this time, right knee fluid aspirate suggest gout and pseudogout, prednisone 60 mg daily x3 days as above #1  8)L shoulder pain-exam consistent more with DJD and Bicipital tendinitis-OT to eval for the same  9) Le swelling-placing TED hose   No family  DVT prophylaxis: Lovenox code Status: Full family Communication: discussed with friend at [facility]--all q's answered disposition Plan: Inpatient   Jaymeson Mengel, MD  [facility] Direct contact: [phone] --Via amion app OR  --[email or web address]; password [email or web address]  7PM-7AM contact night coverage as above 06/12/2018, 2:34 PM  LOS: 5 days   Consultants:  ID  Neurosurgery    Procedures:  Multiple  Antimicrobials:  none  Interval history/Subjective:  Unstable on feet with therapy eating and drinking   Objective:  Vitals:  Vitals:   06/12/18 0400 06/12/18 0822  BP: 124/86 (!) 140/92  Pulse: 63 66  Resp: 20 15  Temp: 97.6 F (36.4 C) 98  F (36.7 C)  SpO2: 100% 99%    Exam:  . Awake alert  pleasant . EOMI NCAT . Bilateral swelling with sotkcings on . Abdomen is soft nontender . Chest is clear . Neurologically he is able to move all 4 limbs--he has clonus and hyperreflxia   I have personally reviewed the following:   Labs: WBC 16.6-->13.6, hemoglobin 8.6-->8.9 BUn/creat 21/0.97   Imaging studies:  None today  Medical tests:  No  Test discussed with performing physician:  No  Decision to obtain old records:  No  Review and summation of old records:  No  Scheduled Meds: . amLODipine  2.5 mg Oral Daily  . febuxostat  40 mg Oral Daily  . gabapentin  300 mg Oral TID  . pantoprazole  40 mg Oral BID  . polyethylene glycol  17 g Oral BID   Continuous Infusions: . heparin 850 Units/hr (06/11/18 1511)  . methocarbamol (ROBAXIN) IV 500 mg (06/10/18 1839)    Principal Problem:   Fever Active Problems:   Benign essential HTN   Cord compression (HCC)   History of pulmonary embolism   LOS: 5 days

## 2018-06-12 NOTE — Progress Notes (Signed)
Rehab Admissions Coordinator Note:  Patient was screened by Clois Dupes for appropriateness for an Inpatient Acute Rehab Consult per OT recommendation. .  At this time, we are recommending Inpatient Rehab consult if pt would like to be considered for admit. Please advise.  Clois Dupes 06/12/2018, 8:34 AM  I can be reached at [phone].

## 2018-06-12 NOTE — Progress Notes (Signed)
Patient ID: [name] [name], male   DOB: 1966-08-15, 52 y.o.   MRN: [id number] Notes from primary medical service and infectious disease noted no source of infection they feel this is more of an inflammatory arthritides.  Patient significantly improved on IV steroids.  We will plan posterior cervical decompression and fusion later in the week.  Would need an idea of patient's clearance from his pulmonary emboli perspective.  Would defer to the primary medical service or if they feel necessary consult pulmonary to make sure patient can go under general anesthesia and be off of his anticoagulation for 12 hours before surgery and at least 72 hours postoperatively.  Ideally would like to have him off all anti-coagulation for at least 1 week postoperatively.  She will be at high risk for developing an epidural hematoma as we do have to do a posterior cervical decompressive laminectomy.

## 2018-06-12 NOTE — Progress Notes (Signed)
Orthopedic Tech Progress Note Patient Details:  [name] [name] [birth year] [id number]  Patient ID: [name], male   DOB: 03-Mar-1966, 52 y.o.   MRN: [id number]   [name] 06/12/2018, 12:00 PMCalled [employer] for right foot and ankle orthosis.

## 2018-06-12 NOTE — Progress Notes (Signed)
INFECTIOUS DISEASE PROGRESS NOTE  ID: [name] [name] is a 52 y.o. male with  Principal Problem:   Fever Active Problems:   Benign essential HTN   Cord compression (HCC)   History of pulmonary embolism  Subjective: Frustration over diagnostic uncertainty  Abtx:  Anti-infectives (From admission, onward)   None      Medications:  Scheduled: . amLODipine  2.5 mg Oral Daily  . febuxostat  40 mg Oral Daily  . gabapentin  300 mg Oral TID  . pantoprazole  40 mg Oral BID  . polyethylene glycol  17 g Oral BID    Objective: Vital signs in last 24 hours: Temp:  [97.6 F (36.4 C)-98.4 F (36.9 C)] 98 F (36.7 C) (09/30 1610) Pulse Rate:  [60-73] 66 (09/30 0822) Resp:  [15-20] 15 (09/30 0822) BP: (108-140)/(67-92) 140/92 (09/30 0822) SpO2:  [97 %-100 %] 99 % (09/30 0822)   General appearance: alert, cooperative and no distress Resp: clear to auscultation bilaterally Cardio: regular rate and rhythm GI: normal findings: bowel sounds normal and soft, non-tender Extremities: mild tenderness, swelling L shoulder. decreased swelling L knee. warmth and swelling of R knee.   Lab Results Recent Labs    06/11/18 0101 06/12/18 0119  WBC 16.6* 13.6*  HGB 8.6* 8.9*  HCT 27.9* 29.4*  NA  --  139  K  --  3.9  CL  --  106  CO2  --  24  BUN  --  21*  CREATININE  --  0.97   Liver Panel Recent Labs    06/12/18 0119  PROT 6.1*  ALBUMIN 2.6*  AST 32  ALT 27  ALKPHOS 77  BILITOT 0.4   Sedimentation Rate No results for input(s): ESRSEDRATE in the last 72 hours. C-Reactive Protein No results for input(s): CRP in the last 72 hours.  Microbiology: Recent Results (from the past 240 hour(s))  Culture, blood (routine x 2)     Status: None (Preliminary result)   Collection Time: 06/07/18 12:23 AM  Result Value Ref Range Status   Specimen Description BLOOD RIGHT ANTECUBITAL  Final   Special Requests   Final    BOTTLES DRAWN AEROBIC ONLY Blood Culture results may not be  optimal due to an inadequate volume of blood received in culture bottles   Culture   Final    NO GROWTH 4 DAYS Performed at [facility], [address] [place], [place] [address]    Report Status PENDING  Incomplete  Culture, blood (routine x 2)     Status: None (Preliminary result)   Collection Time: 06/07/18 12:33 AM  Result Value Ref Range Status   Specimen Description BLOOD RIGHT HAND  Final   Special Requests   Final    BOTTLES DRAWN AEROBIC ONLY Blood Culture results may not be optimal due to an inadequate volume of blood received in culture bottles   Culture   Final    NO GROWTH 4 DAYS Performed at [facility], [address] [place], [place] [address]    Report Status PENDING  Incomplete  Culture, Urine     Status: Abnormal   Collection Time: 06/07/18  8:34 AM  Result Value Ref Range Status   Specimen Description URINE, CATHETERIZED  Final   Special Requests NONE  Final   Culture (A)  Final    <10,000 COLONIES/mL INSIGNIFICANT GROWTH Performed at [facility], [address] [place], [place] [address]    Report Status 06/08/2018 FINAL  Final  Culture, blood (routine x 2)     Status: None (Preliminary result)   Collection Time: 06/08/18  3:03 PM  Result Value Ref Range Status   Specimen Description BLOOD RIGHT ANTECUBITAL  Final   Special Requests   Final    BOTTLES DRAWN AEROBIC AND ANAEROBIC Blood Culture results may not be optimal due to an excessive volume of blood received in culture bottles   Culture   Final    NO GROWTH 3 DAYS Performed at [facility], [address], [place], [place] [address]    Report Status PENDING  Incomplete  Culture, blood (routine x 2)     Status: None (Preliminary result)   Collection Time: 06/08/18  3:03 PM  Result Value Ref Range Status   Specimen Description BLOOD RIGHT HAND  Final   Special Requests   Final    BOTTLES DRAWN AEROBIC AND ANAEROBIC Blood Culture results may not be optimal due to an  excessive volume of blood received in culture bottles   Culture   Final    NO GROWTH 3 DAYS Performed at [facility], [address], [place], [place] [address]    Report Status PENDING  Incomplete  Body fluid culture     Status: None (Preliminary result)   Collection Time: 06/09/18  2:12 PM  Result Value Ref Range Status   Specimen Description SYNOVIAL  Final   Special Requests RIGHT KNEE  Final   Gram Stain   Final    MODERATE WBC PRESENT, PREDOMINANTLY PMN NO ORGANISMS SEEN    Culture   Final    NO GROWTH 2 DAYS Performed at [facility], [address], [place], [place] [address]    Report Status PENDING  Incomplete    Studies/Results: No results found.   Assessment/Plan: FUO Polyarthralgias Neck Surgery 03-2018  Total days of antibiotics: off  Continue to watch off anbx Has gotten steroids Markers not clearly consistent with RA  Would consider repeat arthrocentesis (states he got these prior and they were negative), ortho/rheum eval? Check gc/chlamydia Available as needed.         Johny Sax MD, FACP Infectious Diseases (pager) [phone] [email or web address] 06/12/2018, 8:41 AM  LOS: 5 days

## 2018-06-12 NOTE — Evaluation (Signed)
Physical Therapy Evaluation Patient Details Name: [name] [name] MRN: [id number] DOB: Apr 27, 1966 Today's Date: 06/12/2018   History of Present Illness  52 y.o. male with recent ACDF at C3 C5 by Dr. Wynetta Emery in July 2019 complicated by pulmonary embolism was placed on apixaban subsequently discharged to rehab and then SNF. PMH including gout, HTN, and pulmonary embolism. Pt presenting post fall after his legs gave away after getting out of the car. MRI of cervical and lumbar spine showing no change or infection per Dr. Wynetta Emery. MR of right knee showing complex tear of the posterior horn of the medial meniscus. Right knee aspirated on 06/09/18.   Clinical Impression  Patient x2 sessions this am. First session for evaluation purposes, patient demonstrates significant limitations in strength bilaterally right > left. Patient with + sustained clonus bilaterally and limited sensory and coordination deficits.  Patient extremely frustrated at this time feeling like he has "not been heard". Reassurance provided to patient. Spoke with patient regarding mobility attempts with use of bracing locked into extension (orthopedics indicated Right knee immobilizer). Patient agreeable to this during second session with additional assist available.  During return visit, Patient was able to tolerate 2 bouts of standing in locked out Bledsoe brace.  Upon visual assessment and inspection, Patient with noted quadriceps insufficiency with buckling into brace well above knee joint. Bracing was able to maintain positioning and prevent knee flexion despite inability of quadriceps.  At this time, feel patient would benefit from KAFO bracing given degree of instability and limitations on mobility.  Left LE weakness is apparent as well, but there does appear to be some functional strength to maintain positioning while utilizing bilateral UE support via walker.   OF NOTE: this patient is very well known to this therapist as I have worked with  him several times during previous admission. This appearance is significantly more compromised compared to previous condition and patient with noted bilateral symptoms impacting him at this time. Feel patient would benefit from continued rehabilitation to address deficits and assist with orthotic training (recommending KAFO). and patient has been very open in the past. If condition continues to worsen, may need to consider alternate means of mobility as ambulation at this time is not safe given the inability to predict when muscle insufficiency will cause additional falls.     Follow Up Recommendations CIR    Equipment Recommendations  (Recommend right KAFO brace )    Recommendations for Other Services       Precautions / Restrictions Precautions Precautions: Fall Precaution Comments: Pt reports he has been cleared for cervical precautions and collar. Required Braces or Orthoses: Other Brace/Splint Other Brace/Splint: Right hinged knee brace. Restrictions Other Position/Activity Restrictions: Pt unable to bear weight through right knee      Mobility  Bed Mobility Overal bed mobility: Needs Assistance Bed Mobility: Rolling;Sidelying to Sit Rolling: Supervision Sidelying to sit: Supervision       General bed mobility comments: Supervision for safety. Pt using log roll technique. Increased time due to difficulty moving RLE  Transfers Overall transfer level: Needs assistance Equipment used: Rolling walker (2 wheeled) Transfers: Sit to/from [employer] Sit to Stand: Max assist         General transfer comment: Max assist to power up to standing with RW, RLE knee brace locked into extension. Performed x2 during session  Ambulation/Gait             General Gait Details: deferred at this time  Stairs  Wheelchair Mobility    Modified Rankin (Stroke Patients Only)       Balance Overall balance assessment: Needs  assistance Sitting-balance support: No upper extremity supported;Feet supported Sitting balance-Leahy Scale: Good     Standing balance support: Bilateral upper extremity supported;During functional activity Standing balance-Leahy Scale: Poor Standing balance comment: Unable to maintain standing without UE support                             Pertinent Vitals/Pain Pain Assessment: Faces Faces Pain Scale: Hurts little more Pain Location: Right knee Pain Descriptors / Indicators: Discomfort;Grimacing    Home Living Family/patient expects to be discharged to:: Inpatient rehab Living Arrangements: Parent               Additional Comments: Pt has been to CIR and SNF, then dicharges home with his parents. He states that he will real the goals at therapy and walk the distance, but his knee continued to buckle.     Prior Function Level of Independence: Needs assistance   Gait / Transfers Assistance Needed: Since rehab and accident, has been using RW and knee brace  ADL's / Homemaking Assistance Needed: Using AE for sock/shoe management. Performs ADLs independently. Family performs IADLs.  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      Hand Dominance   Dominant Hand: Right    Extremity/Trunk Assessment   Upper Extremity Assessment Upper Extremity Assessment: Defer to OT evaluation    Lower Extremity Assessment Lower Extremity Assessment: RLE deficits/detail;LLE deficits/detail RLE Deficits / Details: patient with noted bilateral LE weakness, R>L. Right LE 2/5 gross motions with decreased sensation noted RLE: Unable to fully assess due to immobilization RLE Sensation: decreased light touch;decreased proprioception RLE Coordination: decreased fine motor;decreased gross motor LLE Deficits / Details: noted weakness in gross motions upon testing 3-/5 with + sustained clonus LLE Coordination: decreased fine motor;decreased gross  motor    Cervical / Trunk Assessment Cervical / Trunk Assessment: Normal;Other exceptions Cervical / Trunk Exceptions: prior cervical surgery  Communication   Communication: No difficulties  Cognition Arousal/Alertness: Awake/alert Behavior During Therapy: WFL for tasks assessed/performed Overall Cognitive Status: Within Functional Limits for tasks assessed                                 General Comments: Pt agreeable to therapy and able to provide full story of situaion since accident. Pt verbalizing frustration with current situation.      General Comments      Exercises     Assessment/Plan    PT Assessment Patient needs continued PT services  PT Problem List Decreased strength;Decreased activity tolerance;Decreased balance;Decreased mobility;Decreased coordination;Impaired sensation       PT Treatment Interventions DME instruction;Gait training;Stair training;Functional mobility training;Therapeutic activities;Therapeutic exercise;Balance training;Neuromuscular re-education;Patient/family education    PT Goals (Current goals can be found in the Care Plan section)  Acute Rehab PT Goals Patient Stated Goal: "Find out why my knee keeps buckling" PT Goal Formulation: With patient/family Time For Goal Achievement: 06/26/18 Potential to Achieve Goals: Good    Frequency Min 3X/week   Barriers to discharge        Co-evaluation               AM-PAC PT "6 Clicks" Daily Activity  Outcome Measure Difficulty turning over in bed (including adjusting bedclothes, sheets and blankets)?:  Unable Difficulty moving from lying on back to sitting on the side of the bed? : Unable Difficulty sitting down on and standing up from a chair with arms (e.g., wheelchair, bedside commode, etc,.)?: Unable Help needed moving to and from a bed to chair (including a wheelchair)?: A Lot Help needed walking in hospital room?: A Lot Help needed climbing 3-5 steps with a railing?  : Total 6 Click Score: 8    End of Session Equipment Utilized During Treatment: Gait belt;Right knee immobilizer Activity Tolerance: Patient tolerated treatment well;Patient limited by fatigue;Patient limited by pain Patient left: in bed;with call bell/phone within reach;with family/visitor present Nurse Communication: Mobility status;Patient requests pain meds PT Visit Diagnosis: Muscle weakness (generalized) (M62.81);Difficulty in walking, not elsewhere classified (R26.2);Other symptoms and signs involving the nervous system (R29.898)    Time: [phone] (session one from 8:12 to 8:30)  PT Time Calculation (min) (ACUTE ONLY): 21 min + 18 minutes   Charges:   PT Evaluation $PT Eval Moderate Complexity: 1 Mod PT Treatments $Therapeutic Activity: 8-22 mins        Charlotte Crumb, PT DPT  Board Certified Neurologic Specialist Acute Rehabilitation Services Pager [phone] Office [phone]   Fabio Asa 06/12/2018, 10:26 AM

## 2018-06-13 ENCOUNTER — Other Ambulatory Visit: Payer: Self-pay | Admitting: Neurosurgery

## 2018-06-13 LAB — COMPREHENSIVE METABOLIC PANEL
ALT: 47 U/L — AB (ref 0–44)
AST: 55 U/L — ABNORMAL HIGH (ref 15–41)
Albumin: 2.5 g/dL — ABNORMAL LOW (ref 3.5–5.0)
Alkaline Phosphatase: 71 U/L (ref 38–126)
Anion gap: 7 (ref 5–15)
BUN: 19 mg/dL (ref 6–20)
CHLORIDE: 107 mmol/L (ref 98–111)
CO2: 25 mmol/L (ref 22–32)
CREATININE: 1 mg/dL (ref 0.61–1.24)
Calcium: 9 mg/dL (ref 8.9–10.3)
GFR calc Af Amer: >60 mL/min (ref >60)
Glucose, Bld: 131 mg/dL — ABNORMAL HIGH (ref 70–99)
POTASSIUM: 4.1 mmol/L (ref 3.5–5.1)
SODIUM: 139 mmol/L (ref 135–145)
Total Bilirubin: 0.3 mg/dL (ref 0.3–1.2)
Total Protein: 6.3 g/dL — ABNORMAL LOW (ref 6.5–8.1)

## 2018-06-13 LAB — CBC
HEMATOCRIT: 30.3 % — AB (ref 39.0–52.0)
HEMOGLOBIN: 9.1 g/dL — AB (ref 13.0–17.0)
MCH: 27.8 pg (ref 26.0–34.0)
MCHC: 30 g/dL (ref 30.0–36.0)
MCV: 92.7 fL (ref 78.0–100.0)
Platelets: 387 10*3/uL (ref 150–400)
RBC: 3.27 MIL/uL — ABNORMAL LOW (ref 4.22–5.81)
RDW: 14.3 % (ref 11.5–15.5)
WBC: 14.2 10*3/uL — AB (ref 4.0–10.5)

## 2018-06-13 LAB — SEDIMENTATION RATE: SED RATE: 135 mm/h — AB (ref 0–16)

## 2018-06-13 LAB — CULTURE, BLOOD (ROUTINE X 2)
CULTURE: NO GROWTH
CULTURE: NO GROWTH

## 2018-06-13 LAB — HEPARIN LEVEL (UNFRACTIONATED): HEPARIN UNFRACTIONATED: 0.39 [IU]/mL (ref 0.30–0.70)

## 2018-06-13 MED ORDER — INFLUENZA VAC SPLIT QUAD 0.5 ML IM SUSY: 0.5000 mL | PREFILLED_SYRINGE | INTRAMUSCULAR | Status: DC

## 2018-06-13 MED ORDER — PREDNISONE 20 MG PO TABS
20.0000 mg | ORAL_TABLET | Freq: Every day | ORAL | Status: DC
  Filled 2018-06-13 (×2): qty 1

## 2018-06-13 NOTE — Progress Notes (Signed)
ANTICOAGULATION CONSULT NOTE   Pharmacy Consult for Heparin (Apixaban on hold) Indication: pulmonary embolus  Allergies  Allergen Reactions  . Nsaids Other (See Comments)    G6PD  . Sulfa Antibiotics Other (See Comments)    G6PD    Patient Measurements: Height: 5\' 9"  (175.3 cm) Weight: 272 lb 14.9 oz (123.8 kg) IBW/kg (Calculated) : 70.7  Vital Signs: Temp: 97.9 F (36.6 C) (10/01 0824) Temp Source: Oral (10/01 0824) BP: 140/88 (10/01 0824) Pulse Rate: 53 (10/01 0824)  Labs: Recent Labs    06/10/18 1420  06/11/18 0101  06/11/18 1605 06/12/18 0119 06/13/18 0500 06/13/18 0508  HGB  --    < > 8.6*  --   --  8.9* 9.1*  --   HCT  --   --  27.9*  --   --  29.4* 30.3*  --   PLT  --   --  344  --   --  375 387  --   APTT 101*  --  110*  --   --   --   --   --   HEPARINUNFRC 0.88*  --  0.90*   < > 0.59 0.40  --  0.39  CREATININE  --   --   --   --   --  0.97 1.00  --    < > = values in this interval not displayed.    Estimated Creatinine Clearance: 112.3 mL/min (by C-G formula based on SCr of 1 mg/dL).   Medical History: Past Medical History:  Diagnosis Date  . Degenerative disc disease, lumbar   . Gout   . History of pernicious anemia 1980   Patient says he takes OTC iron supplements QD  . Hypertension     Assessment: 52 y/o M with recent hx of bilateral PE/DVT, on apixaban PTA, holding apixaban and starting heparin, last dose apixaban >12 hours ago per pt, OK to start heparin now, Hgb 9.7, plts good, renal function good.   Heparin level therapeutic at 0.39 on 850 units/hr, H/H low but improved, plts wnl and no bleeding noted.  Goal of Therapy:  Heparin level 0.3-0.7 units/mL Monitor platelets by anticoagulation protocol: Yes   Plan:  Continue heparin gtt at 850 units/hr Daily heparin level, CBC, s/s bleeding F/u apixaban restart  Daylene Posey, PharmD Clinical Pharmacist Please check AMION for all [facility] Pharm[facility] numbers 06/13/2018 8:36 AM

## 2018-06-13 NOTE — Progress Notes (Signed)
NEUROSURGERY PROGRESS NOTE  Doing well. Spoke with patient and we will plan for his surgery tomorrow at 12:00. Per medicine they are ok with him being off of his blood thinners for surgery.   Temp:  [97.9 F (36.6 C)-98.4 F (36.9 C)] 97.9 F (36.6 C) (10/01 0824) Pulse Rate:  [53-77] 53 (10/01 0824) Resp:  [17-20] 17 (10/01 0824) BP: (125-140)/(83-92) 140/88 (10/01 0824) SpO2:  [98 %-100 %] 100 % (10/01 0824)    Sherryl Manges, NP 06/13/2018 9:58 AM

## 2018-06-13 NOTE — Progress Notes (Signed)
Physical Therapy Treatment Patient Details Name: [name] [name] MRN: [id number] DOB: [birth year] Today's Date: 06/13/2018    History of Present Illness 52 y.o. male with recent ACDF at C3 C5 by Dr. Wynetta Emery in July 2019 complicated by pulmonary embolism was placed on apixaban subsequently discharged to rehab and then SNF. PMH including gout, HTN, and pulmonary embolism. Pt presenting post fall after his legs gave away after getting out of the car. MRI of cervical and lumbar spine showing no change or infection per Dr. Wynetta Emery. MR of right knee showing complex tear of the posterior horn of the medial meniscus. Right knee aspirated on 06/09/18.     PT Comments    Pt tolerated treatment well. Pt continues to require increasing physical assist for transfers and functional mobility. Decreased quad activity and heavy reliance on R knee brace locked into extension. Pt remains unable to ambulate at this time. Current plan of care remains appropriate.    Follow Up Recommendations  CIR     Equipment Recommendations  (TBD)    Recommendations for Other Services       Precautions / Restrictions Precautions Precautions: Fall Precaution Comments: Pt reports he has been cleared for cervical precautions and collar. Required Braces or Orthoses: Other Brace/Splint Other Brace/Splint: Right hinged knee brace. Restrictions Weight Bearing Restrictions: No Other Position/Activity Restrictions: Can bear weight through right leg if brace locked out.    Mobility  Bed Mobility Overal bed mobility: Needs Assistance Bed Mobility: Supine to Sit;Sit to Supine     Supine to sit: Min assist Sit to supine: Min assist   General bed mobility comments: Pt needed assistance for advancing the RLE off of the bed and getting it back in bed.  HOB elevated approximately 45 degrees with supine to sit, but all the way flat for sit to supine.    Transfers Overall transfer level: Needs assistance Equipment used: Rolling  walker (2 wheeled) Transfers: Sit to/from Stand Sit to Stand: Mod assist Stand pivot transfers: Min assist       General transfer comment: Mod assist for sit to stand from bed elevated with right knee brace locked in extension. Pt required cueing to initiate momentum to power up to stand and hand placement  Ambulation/Gait Ambulation/Gait assistance: Min assist Gait Distance (Feet): 2 Feet Assistive device: Rolling walker (2 wheeled) Gait Pattern/deviations: Step-to pattern;Decreased stride length Gait velocity: decreased Gait velocity interpretation: <1.31 ft/sec, indicative of household ambulator General Gait Details: Pt took 2 steps forward with rolling walker and back from side of bed. Required physical assistance to maintain balance and verbal cueing for posture.    Stairs             Wheelchair Mobility    Modified Rankin (Stroke Patients Only)       Balance Overall balance assessment: Needs assistance Sitting-balance support: Feet supported Sitting balance-Leahy Scale: Normal     Standing balance support: Bilateral upper extremity supported Standing balance-Leahy Scale: Poor Standing balance comment: Pt needs bilateral arm support on rolling walker and R LE brace locked in extension to maintain balance                            Cognition Arousal/Alertness: Awake/alert Behavior During Therapy: WFL for tasks assessed/performed Overall Cognitive Status: Within Functional Limits for tasks assessed  Exercises      General Comments        Pertinent Vitals/Pain Pain Assessment: Faces Faces Pain Scale: Hurts little more Pain Location: right knee Pain Descriptors / Indicators: Discomfort Pain Intervention(s): Limited activity within patient's tolerance;Monitored during session    Home Living                      Prior Function            PT Goals (current goals can now be  found in the care plan section) Acute Rehab PT Goals PT Goal Formulation: With patient Time For Goal Achievement: 06/26/18 Potential to Achieve Goals: Good Progress towards PT goals: Progressing toward goals    Frequency    Min 3X/week      PT Plan Current plan remains appropriate    Co-evaluation              AM-PAC PT "6 Clicks" Daily Activity  Outcome Measure  Difficulty turning over in bed (including adjusting bedclothes, sheets and blankets)?: Unable Difficulty moving from lying on back to sitting on the side of the bed? : Unable Difficulty sitting down on and standing up from a chair with arms (e.g., wheelchair, bedside commode, etc,.)?: Unable Help needed moving to and from a bed to chair (including a wheelchair)?: A Lot Help needed walking in hospital room?: A Lot Help needed climbing 3-5 steps with a railing? : Total 6 Click Score: 8    End of Session Equipment Utilized During Treatment: Gait belt;Right knee immobilizer Activity Tolerance: Patient tolerated treatment well;Patient limited by pain;Patient limited by fatigue Patient left: in bed;with call bell/phone within reach(knee brace off ) Nurse Communication: Mobility status;Patient requests pain meds PT Visit Diagnosis: Muscle weakness (generalized) (M62.81);Difficulty in walking, not elsewhere classified (R26.2);Other symptoms and signs involving the nervous system (R29.898)     Time: 1610-9604 PT Time Calculation (min) (ACUTE ONLY): 19 min  Charges:                        Denman George, SPT   Denman George 06/13/2018, 3:43 PM

## 2018-06-13 NOTE — Progress Notes (Signed)
[facility] PROGRESS NOTE  [name] [name] MRN:[id number] DOB: 03/29/1966 DOA: 06/06/2018 PCP: Howell, [name], MD  Narrative:  51-year-old male ACDF C3-C5 Dr. Cram 03/2018---Had Ant Corpectomy complicated by Bil pulmonary embolisms post op--needed emergent hematoma evacuation POD#3-IVC filter placed 7/16 followed by Dr. Kale of oncology--discharge from rehab 8/20 after extended stay Known left posterior meniscal horn tear left knee, pernicious anemia, TCP, HTN, neurogenic bowel/bladder, gout Sent home with cervical collar Bledsoe brace  Readmitted to hospital from [facility]-had gone to ED 05/09/2018 with increasing weakness, possible pneumonia Returned to [place] , bilateral weakness paresthesias and increasing difficulty ambulating  Was directly admitted to having fevers for the past 2 months MRI spine did not show any evidence of epidural hemorrhage or abscess on admission BUN/creatinine slightly elevated on admission 1.01   A & Plan  1)Persistent Fevers and Elevated inflammatory markers--- T-max 100.7, ESR over 140, CRP over 20, now has leukocytosis (had decadron on 06/07/18), infectious versus inflammatory etiology, cultures, chest x-rays, MRIs all negative- Infectious disease consult/input appreciated,  x-rays of both knees reveals mild effusion on the right knee otherwise no acute findings , Bil DJD noted, Rt Knee joint aspirate from 06/09/2018 without evidence of infection/septic arthritis. Dr. Emokpae D/w Dr Govinda Aryal ([facility]), his input is appreciated..... (cell phone--[phone])- prednisone 60 mg x 3 days-steroids and output tapered to 20 mg ongoing for the time being and can probably be stopped postoperatively Defer stress dose of steroids to anesthesia/neurosurgery peri-procedurally  2)S/p ACDF at C3- C5  On 03/24/18--- Dr Cram--- as per neurosurgical attending  Dr. Cram. Pulm clearance d/w Dr. Byrum--discussed with him and he states after  review of data with me that patient is closer to risk of a normal person--may be reasonable to stop during peri-op period but can resume when safe from NS perspective post-op  3)Bil Knee Pain/Swelling- Rt > Lt--patient has had gout since the age of 25 Pathognomic signs for him are swelling of left toe Synovial fluid confirms crystal-suspect fevers are related to the same as above  4)HTN--stable, stopped HCTZ 2/2 gout use amlodipine 2.5 mg daily instead-currently stable  5)Polymyalgia and Polyarthralgia----total CK is only 60, patient did not have a leukocytosis until he received Decadron, f? inflammatory versus infectious etiology, CRP is 18, ANCA is negative, aldolase is negative so far, CCP is weakly positive CK is negative, ANA is negative ? Sero-neg arthritis per my discussion with Dr. Aryal--further management as OP   6)Recent PE--- continue IV heparin --? switch back to apixaban once okay per NS  7)Gout-patient has had gout for over 30 years he tells me, uric acid levels is actually not elevated at this time, right knee fluid aspirate suggest gout and pseudogout, prednisone discussion as above  8)L shoulder pain-exam consistent more with DJD and Bicipital tendinitis-OT to eval for the same  9) Le swelling-placing TED hose   No family  DVT prophylaxis: Lovenox code Status: Full family Communication: discussed with friend at [facility]--all q's answered disposition Plan: Inpatient   , MD  [facility] Direct contact: [phone] --Via amion app OR  --[email or web address]; password [email or web address]  7PM-7AM contact night coverage as above 06/13/2018, 2:08 PM  LOS: 6 days   Consultants:  ID  Neurosurgery    Procedures:  Multiple  Antimicrobials:  none  Interval history/Subjective:  Awake alert Surgery being planned either for tomorrow or Friday?  Unclear when  Objective:  Vitals:  Vitals:   06/13/18 0824 06/13/18 1152  BP: 140/88 125/78  Pulse: (!) 53   86   Resp: 17 16  Temp: 97.9 F (36.6 C) 98.3 F (36.8 C)  SpO2: 100% 100%    Exam:  . Awake alert  . EOMI NCAT . Bilateral swelling stockings on . Abdomen is soft nontender . Chest is clear . Neurologically he is able to move all 4 limbs--he has clonus and hyperreflxia   I have personally reviewed the following:   Labs: WBC 16.6-->13.6-->14 , hemoglobin 8.6-->8.9->9.1 BUn/creat 21/0.97-->19/1.0   Imaging studies:  None today  Medical tests:  No  Test discussed with performing physician:  No  Decision to obtain old records:  No  Review and summation of old records:  No  Scheduled Meds: . amLODipine  2.5 mg Oral Daily  . febuxostat  40 mg Oral Daily  . gabapentin  300 mg Oral TID  . pantoprazole  40 mg Oral BID  . polyethylene glycol  17 g Oral BID  . [START ON 06/14/2018] predniSONE  20 mg Oral QAC breakfast   Continuous Infusions: . heparin 850 Units/hr (06/13/18 0215)  . methocarbamol (ROBAXIN) IV 500 mg (06/10/18 1839)    Principal Problem:   Fever Active Problems:   Benign essential HTN   Cord compression (HCC)   History of pulmonary embolism   LOS: 6 days          

## 2018-06-13 NOTE — Progress Notes (Signed)
Occupational Therapy Treatment Patient Details Name: [name] [name] MRN: [id number] DOB: 04-17-1966 Today's Date: 06/13/2018    History of present illness 52 y.o. male with recent ACDF at C3 C5 by Dr. Wynetta Emery in July 2019 complicated by pulmonary embolism was placed on apixaban subsequently discharged to rehab and then SNF. PMH including gout, HTN, and pulmonary embolism. Pt presenting post fall after his legs gave away after getting out of the car. MRI of cervical and lumbar spine showing no change or infection per Dr. Wynetta Emery. MR of right knee showing complex tear of the posterior horn of the medial meniscus. Right knee aspirated on 06/09/18.    OT comments  Pt continues with RLE weakness and pain with weightbearing and sit to stand.  He needed max asisst for sit to stand during toileting task with min assist for standing balance once up and use of the RW for support bilaterally.  Min assist for transfer to the EOB and for transfer back into bed at end of session.  Feel pt is still a great candidate for continued CIR level therapy once surgery is completed and he is deemed medically stable.   Follow Up Recommendations  CIR;Supervision/Assistance - 24 hour    Equipment Recommendations  None recommended by OT    Recommendations for Other Services Rehab consult;PT consult    Precautions / Restrictions Precautions Precautions: Fall Precaution Comments: Pt reports he has been cleared for cervical precautions and collar. Required Braces or Orthoses: Other Brace/Splint Other Brace/Splint: Right hinged knee brace. Restrictions Weight Bearing Restrictions: No Other Position/Activity Restrictions: Can bear weight through right leg if brace locked out.       Mobility Bed Mobility Overal bed mobility: Needs Assistance Bed Mobility: Supine to Sit;Sit to Supine     Supine to sit: Min assist Sit to supine: Min assist   General bed mobility comments: Pt needed assistance for advancing the RLE off  of the bed and getting it back in bed.  HOB elevated approximately 45 degrees with supine to sit, but all the way flat for sit to supine.    Transfers Overall transfer level: Needs assistance Equipment used: Rolling walker (2 wheeled) Transfers: Sit to/from Stand Sit to Stand: Max assist Stand pivot transfers: Min assist       General transfer comment: max assist for sit to stand from lower bed, mod assist from elevated bed with right knee brace in place.     Balance Overall balance assessment: Needs assistance Sitting-balance support: No upper extremity supported;Feet supported Sitting balance-Leahy Scale: Normal     Standing balance support: Bilateral upper extremity supported;During functional activity Standing balance-Leahy Scale: Poor Standing balance comment: Pt needs at least unilateral arm support to maintain balance.                            ADL either performed or assessed with clinical judgement   ADL                           Toilet Transfer: Maximal assistance;RW;Stand-pivot           Functional mobility during ADLs: Maximal assistance;Rolling walker General ADL Comments: Pt completed sit to stand from the higher bed with the Bledsoe brace locked into extension and mod to max assist.  The lower the bed was positioned the more assist was required to achieve sit to stand.  once standing he could maintain with min assist  using BUE support on the RW.  He then took 2 small steps up toward the [facility] and then back down with min assist as well.  Noted shaking in the LLE when attempting this.  Therapist assisted with completion of peri hygiene in standing with max assist as well.                 Cognition Arousal/Alertness: Awake/alert Behavior During Therapy: WFL for tasks assessed/performed Overall Cognitive Status: Within Functional Limits for tasks assessed                                                      Pertinent Vitals/ Pain       Pain Assessment: Faces Faces Pain Scale: Hurts a little bit Pain Location: right knee Pain Descriptors / Indicators: Discomfort Pain Intervention(s): Limited activity within patient's tolerance;Monitored during session         Frequency  Min 3X/week        Progress Toward Goals  OT Goals(current goals can now be found in the care plan section)  Progress towards OT goals: Progressing toward goals     Plan Discharge plan remains appropriate       AM-PAC PT "6 Clicks" Daily Activity     Outcome Measure   Help from another person eating meals?: None Help from another person taking care of personal grooming?: None Help from another person toileting, which includes using toliet, bedpan, or urinal?: A Lot Help from another person bathing (including washing, rinsing, drying)?: A Lot Help from another person to put on and taking off regular upper body clothing?: None Help from another person to put on and taking off regular lower body clothing?: A Lot 6 Click Score: 18    End of Session Equipment Utilized During Treatment: Rolling walker;Gait belt  OT Visit Diagnosis: Unsteadiness on feet (R26.81);Other abnormalities of gait and mobility (R26.89);Muscle weakness (generalized) (M62.81);Pain Pain - Right/Left: Right Pain - part of body: Knee   Activity Tolerance Patient tolerated treatment well;Other (comment)   Patient Left in bed;with call bell/phone within reach   Nurse Communication Mobility status        Time: 1000-1042 OT Time Calculation (min): 42 min  Charges: OT General Charges $OT Visit: 1 Visit OT Treatments $Self Care/Home Management : 38-52 mins   Rania Prothero OTR/L 06/13/2018, 12:25 PM

## 2018-06-14 ENCOUNTER — Encounter (HOSPITAL_COMMUNITY): Payer: Self-pay | Admitting: Certified Registered"

## 2018-06-14 ENCOUNTER — Inpatient Hospital Stay (HOSPITAL_COMMUNITY): Admitting: Certified Registered"

## 2018-06-14 ENCOUNTER — Inpatient Hospital Stay (HOSPITAL_COMMUNITY)

## 2018-06-14 ENCOUNTER — Encounter (HOSPITAL_COMMUNITY)
Admission: AD | Disposition: A | Discharge status: Rehabilitation Facility | Payer: Self-pay | Source: Other Acute Inpatient Hospital | Attending: Internal Medicine

## 2018-06-14 HISTORY — PX: POSTERIOR CERVICAL FUSION/FORAMINOTOMY: SHX5038

## 2018-06-14 LAB — HEPARIN LEVEL (UNFRACTIONATED)

## 2018-06-14 LAB — TYPE AND SCREEN
ABO/RH(D): O POS
Antibody Screen: NEGATIVE

## 2018-06-14 LAB — CBC
HCT: 28.6 % — ABNORMAL LOW (ref 39.0–52.0)
HEMOGLOBIN: 8.9 g/dL — AB (ref 13.0–17.0)
MCH: 28.3 pg (ref 26.0–34.0)
MCHC: 31.1 g/dL (ref 30.0–36.0)
MCV: 91.1 fL (ref 78.0–100.0)
Platelets: 315 10*3/uL (ref 150–400)
RBC: 3.14 MIL/uL — AB (ref 4.22–5.81)
RDW: 15.1 % (ref 11.5–15.5)
WBC: 10.8 10*3/uL — AB (ref 4.0–10.5)

## 2018-06-14 LAB — SURGICAL PCR SCREEN
MRSA, PCR: NEGATIVE
Staphylococcus aureus: POSITIVE — AB

## 2018-06-14 LAB — GC/CHLAMYDIA PROBE AMP (~~LOC~~) NOT AT ARMC
Chlamydia: NEGATIVE
NEISSERIA GONORRHEA: NEGATIVE

## 2018-06-14 LAB — MRSA PCR SCREENING: MRSA by PCR: NEGATIVE

## 2018-06-14 SURGERY — POSTERIOR CERVICAL FUSION/FORAMINOTOMY LEVEL 3
Anesthesia: General

## 2018-06-14 MED ORDER — CYCLOBENZAPRINE HCL 10 MG PO TABS: 10.0000 mg | ORAL_TABLET | Freq: Three times a day (TID) | ORAL | Status: DC | PRN

## 2018-06-14 MED ORDER — MUPIROCIN 2 % EX OINT
1.0000 "application " | TOPICAL_OINTMENT | Freq: Two times a day (BID) | CUTANEOUS | Status: AC
  Administered 2018-06-14 – 2018-06-16 (×5): 1 via NASAL
  Filled 2018-06-14 (×3): qty 22

## 2018-06-14 MED ORDER — ONDANSETRON HCL 4 MG/2ML IJ SOLN
INTRAMUSCULAR | Status: DC | PRN
  Administered 2018-06-14: 4 mg via INTRAVENOUS

## 2018-06-14 MED ORDER — GLYCOPYRROLATE PF 0.2 MG/ML IJ SOSY
PREFILLED_SYRINGE | INTRAMUSCULAR | Status: AC
  Filled 2018-06-14: qty 1

## 2018-06-14 MED ORDER — ONDANSETRON HCL 4 MG/2ML IJ SOLN
4.0000 mg | Freq: Four times a day (QID) | INTRAMUSCULAR | Status: DC | PRN
  Administered 2018-06-17 – 2018-06-19 (×2): 4 mg via INTRAVENOUS

## 2018-06-14 MED ORDER — CEFAZOLIN SODIUM-DEXTROSE 2-4 GM/100ML-% IV SOLN
2.0000 g | Freq: Three times a day (TID) | INTRAVENOUS | Status: AC
  Administered 2018-06-14 – 2018-06-16 (×6): 2 g via INTRAVENOUS
  Filled 2018-06-14 (×6): qty 100

## 2018-06-14 MED ORDER — PROPOFOL 10 MG/ML IV BOLUS
INTRAVENOUS | Status: DC | PRN
  Administered 2018-06-14: 60 mg via INTRAVENOUS
  Administered 2018-06-14: 110 mg via INTRAVENOUS

## 2018-06-14 MED ORDER — ROCURONIUM BROMIDE 10 MG/ML (PF) SYRINGE
PREFILLED_SYRINGE | INTRAVENOUS | Status: DC | PRN
  Administered 2018-06-14 (×4): 50 mg via INTRAVENOUS

## 2018-06-14 MED ORDER — CEFAZOLIN SODIUM-DEXTROSE 2-4 GM/100ML-% IV SOLN: 2.0000 g | INTRAVENOUS | Status: DC

## 2018-06-14 MED ORDER — ALUM & MAG HYDROXIDE-SIMETH 200-200-20 MG/5ML PO SUSP: 30.0000 mL | Freq: Four times a day (QID) | ORAL | Status: DC | PRN

## 2018-06-14 MED ORDER — SUGAMMADEX SODIUM 500 MG/5ML IV SOLN
INTRAVENOUS | Status: AC
  Filled 2018-06-14: qty 5

## 2018-06-14 MED ORDER — FENTANYL CITRATE (PF) 100 MCG/2ML IJ SOLN
INTRAMUSCULAR | Status: AC
  Filled 2018-06-14: qty 2

## 2018-06-14 MED ORDER — THROMBIN 5000 UNITS EX SOLR
OROMUCOSAL | Status: DC | PRN
  Administered 2018-06-14: 15:00:00 via TOPICAL

## 2018-06-14 MED ORDER — SODIUM CHLORIDE 0.9 % IV SOLN
250.0000 mL | INTRAVENOUS | Status: DC
  Administered 2018-06-16: 250 mL via INTRAVENOUS

## 2018-06-14 MED ORDER — PANTOPRAZOLE SODIUM 40 MG PO TBEC: 40.0000 mg | DELAYED_RELEASE_TABLET | Freq: Every day | ORAL | Status: DC

## 2018-06-14 MED ORDER — SUGAMMADEX SODIUM 200 MG/2ML IV SOLN
INTRAVENOUS | Status: DC | PRN
  Administered 2018-06-14: 240 mg via INTRAVENOUS

## 2018-06-14 MED ORDER — MIDAZOLAM HCL 5 MG/5ML IJ SOLN
INTRAMUSCULAR | Status: DC | PRN
  Administered 2018-06-14: 2 mg via INTRAVENOUS

## 2018-06-14 MED ORDER — BUPIVACAINE HCL (PF) 0.25 % IJ SOLN
INTRAMUSCULAR | Status: DC | PRN
  Administered 2018-06-14: 10 mL

## 2018-06-14 MED ORDER — OXYCODONE HCL 5 MG/5ML PO SOLN: 5.0000 mg | Freq: Once | ORAL | Status: AC | PRN

## 2018-06-14 MED ORDER — OXYCODONE HCL 5 MG PO TABS
15.0000 mg | ORAL_TABLET | ORAL | Status: DC | PRN
  Administered 2018-06-15 – 2018-08-17 (×60): 15 mg via ORAL
  Filled 2018-06-14 (×67): qty 3

## 2018-06-14 MED ORDER — ACETAMINOPHEN 650 MG RE SUPP: 650.0000 mg | RECTAL | Status: DC | PRN

## 2018-06-14 MED ORDER — DEXAMETHASONE SODIUM PHOSPHATE 10 MG/ML IJ SOLN
10.0000 mg | Freq: Four times a day (QID) | INTRAMUSCULAR | Status: DC
  Administered 2018-06-14 – 2018-06-15 (×3): 10 mg via INTRAVENOUS
  Filled 2018-06-14 (×3): qty 1

## 2018-06-14 MED ORDER — LIDOCAINE-EPINEPHRINE 1 %-1:100000 IJ SOLN
INTRAMUSCULAR | Status: DC | PRN
  Administered 2018-06-14: 10 mL

## 2018-06-14 MED ORDER — SODIUM CHLORIDE 0.9% FLUSH
3.0000 mL | Freq: Two times a day (BID) | INTRAVENOUS | Status: DC
  Administered 2018-06-14 – 2018-06-24 (×13): 3 mL via INTRAVENOUS

## 2018-06-14 MED ORDER — DEXAMETHASONE SODIUM PHOSPHATE 10 MG/ML IJ SOLN
INTRAMUSCULAR | Status: AC
  Filled 2018-06-14: qty 1

## 2018-06-14 MED ORDER — ROCURONIUM BROMIDE 50 MG/5ML IV SOSY
PREFILLED_SYRINGE | INTRAVENOUS | Status: AC
  Filled 2018-06-14: qty 5

## 2018-06-14 MED ORDER — PROPOFOL 10 MG/ML IV BOLUS
INTRAVENOUS | Status: AC
  Filled 2018-06-14: qty 20

## 2018-06-14 MED ORDER — OXYCODONE HCL 5 MG PO TABS
5.0000 mg | ORAL_TABLET | Freq: Once | ORAL | Status: AC | PRN
  Administered 2018-06-14: 5 mg via ORAL

## 2018-06-14 MED ORDER — BACITRACIN ZINC 500 UNIT/GM EX OINT
TOPICAL_OINTMENT | CUTANEOUS | Status: AC
  Filled 2018-06-14: qty 28.35

## 2018-06-14 MED ORDER — MENTHOL 3 MG MT LOZG: 1.0000 | LOZENGE | OROMUCOSAL | Status: DC | PRN

## 2018-06-14 MED ORDER — FENTANYL CITRATE (PF) 100 MCG/2ML IJ SOLN
25.0000 ug | INTRAMUSCULAR | Status: DC | PRN
  Administered 2018-06-14: 50 ug via INTRAVENOUS

## 2018-06-14 MED ORDER — 0.9 % SODIUM CHLORIDE (POUR BTL) OPTIME
TOPICAL | Status: DC | PRN
  Administered 2018-06-14: 1000 mL

## 2018-06-14 MED ORDER — CHLORHEXIDINE GLUCONATE CLOTH 2 % EX PADS
6.0000 | MEDICATED_PAD | Freq: Once | CUTANEOUS | Status: AC
  Administered 2018-06-14: 6 via TOPICAL

## 2018-06-14 MED ORDER — LIDOCAINE-EPINEPHRINE 1 %-1:100000 IJ SOLN
INTRAMUSCULAR | Status: AC
  Filled 2018-06-14: qty 1

## 2018-06-14 MED ORDER — BACITRACIN ZINC 500 UNIT/GM EX OINT
TOPICAL_OINTMENT | CUTANEOUS | Status: DC | PRN
  Administered 2018-06-14: 1 via TOPICAL

## 2018-06-14 MED ORDER — SODIUM CHLORIDE 0.9% FLUSH: 3.0000 mL | INTRAVENOUS | Status: DC | PRN

## 2018-06-14 MED ORDER — MIDAZOLAM HCL 2 MG/2ML IJ SOLN
INTRAMUSCULAR | Status: AC
  Filled 2018-06-14: qty 2

## 2018-06-14 MED ORDER — FENTANYL CITRATE (PF) 250 MCG/5ML IJ SOLN
INTRAMUSCULAR | Status: AC
  Filled 2018-06-14: qty 5

## 2018-06-14 MED ORDER — LACTATED RINGERS IV SOLN
INTRAVENOUS | Status: DC
  Administered 2018-06-14 – 2018-06-16 (×3): via INTRAVENOUS

## 2018-06-14 MED ORDER — THROMBIN 5000 UNITS EX SOLR
CUTANEOUS | Status: AC
  Filled 2018-06-14: qty 5000

## 2018-06-14 MED ORDER — OXYCODONE HCL 5 MG PO TABS
ORAL_TABLET | ORAL | Status: AC
  Filled 2018-06-14: qty 1

## 2018-06-14 MED ORDER — ROCURONIUM BROMIDE 50 MG/5ML IV SOSY
PREFILLED_SYRINGE | INTRAVENOUS | Status: AC
  Filled 2018-06-14: qty 15

## 2018-06-14 MED ORDER — SODIUM CHLORIDE 0.9 % IV SOLN
INTRAVENOUS | Status: DC | PRN
  Administered 2018-06-14: 35 ug/min via INTRAVENOUS

## 2018-06-14 MED ORDER — SODIUM CHLORIDE 0.9 % IV SOLN
INTRAVENOUS | Status: DC | PRN
  Administered 2018-06-14: 14:00:00

## 2018-06-14 MED ORDER — DEXAMETHASONE SODIUM PHOSPHATE 10 MG/ML IJ SOLN
INTRAMUSCULAR | Status: DC | PRN
  Administered 2018-06-14: 10 mg via INTRAVENOUS

## 2018-06-14 MED ORDER — THROMBIN (RECOMBINANT) 20000 UNITS EX SOLR
CUTANEOUS | Status: AC
  Filled 2018-06-14: qty 20000

## 2018-06-14 MED ORDER — ACETAMINOPHEN 325 MG PO TABS
650.0000 mg | ORAL_TABLET | ORAL | Status: DC | PRN
  Administered 2018-06-17 – 2018-06-21 (×2): 650 mg via ORAL
  Filled 2018-06-14: qty 2

## 2018-06-14 MED ORDER — PHENYLEPHRINE 40 MCG/ML (10ML) SYRINGE FOR IV PUSH (FOR BLOOD PRESSURE SUPPORT)
PREFILLED_SYRINGE | INTRAVENOUS | Status: DC | PRN
  Administered 2018-06-14 (×2): 80 ug via INTRAVENOUS

## 2018-06-14 MED ORDER — THROMBIN 20000 UNITS EX SOLR
CUTANEOUS | Status: DC | PRN
  Administered 2018-06-14: 14:00:00 via TOPICAL

## 2018-06-14 MED ORDER — PHENOL 1.4 % MT LIQD: 1.0000 | OROMUCOSAL | Status: DC | PRN

## 2018-06-14 MED ORDER — BUPIVACAINE HCL (PF) 0.25 % IJ SOLN
INTRAMUSCULAR | Status: AC
  Filled 2018-06-14: qty 30

## 2018-06-14 MED ORDER — HYDROMORPHONE HCL 1 MG/ML IJ SOLN
1.0000 mg | INTRAMUSCULAR | Status: DC | PRN
  Administered 2018-06-15 – 2018-07-02 (×12): 1 mg via INTRAVENOUS
  Filled 2018-06-14 (×13): qty 1

## 2018-06-14 MED ORDER — GLYCOPYRROLATE PF 0.2 MG/ML IJ SOSY
PREFILLED_SYRINGE | INTRAMUSCULAR | Status: DC | PRN
  Administered 2018-06-14: .2 mg via INTRAVENOUS

## 2018-06-14 MED ORDER — ONDANSETRON HCL 4 MG/2ML IJ SOLN
INTRAMUSCULAR | Status: AC
  Filled 2018-06-14: qty 2

## 2018-06-14 MED ORDER — FENTANYL CITRATE (PF) 250 MCG/5ML IJ SOLN
INTRAMUSCULAR | Status: DC | PRN
  Administered 2018-06-14: 50 ug via INTRAVENOUS
  Administered 2018-06-14: 100 ug via INTRAVENOUS
  Administered 2018-06-14 (×3): 50 ug via INTRAVENOUS

## 2018-06-14 MED ORDER — DEXTROSE 5 % IV SOLN
3.0000 g | INTRAVENOUS | Status: AC
  Administered 2018-06-14: 3 g via INTRAVENOUS
  Filled 2018-06-14: qty 3

## 2018-06-14 MED ORDER — ONDANSETRON HCL 4 MG PO TABS: 4.0000 mg | ORAL_TABLET | Freq: Four times a day (QID) | ORAL | Status: DC | PRN

## 2018-06-14 SURGICAL SUPPLY — 71 items
BAG DECANTER FOR FLEXI CONT (MISCELLANEOUS) ×2 IMPLANT
BENZOIN TINCTURE PRP APPL 2/3 (GAUZE/BANDAGES/DRESSINGS) ×2 IMPLANT
BIT DRILL 3.5 SHORT (BIT) ×2 IMPLANT
BLADE CLIPPER SURG (BLADE) ×2 IMPLANT
BLADE SURG 11 STRL SS (BLADE) ×2 IMPLANT
BONE VIVIGEN FORMABLE 5.4CC (Bone Implant) ×2 IMPLANT
BUR MATCHSTICK NEURO 3.0 LAGG (BURR) ×2 IMPLANT
CANISTER SUCT 3000ML PPV (MISCELLANEOUS) ×2 IMPLANT
CAP LOCKING (Cap) ×8 IMPLANT
CARTRIDGE OIL MAESTRO DRILL (MISCELLANEOUS) ×1 IMPLANT
COVER WAND RF STERILE (DRAPES) ×2 IMPLANT
DECANTER SPIKE VIAL GLASS SM (MISCELLANEOUS) ×2 IMPLANT
DERMABOND ADVANCED (GAUZE/BANDAGES/DRESSINGS) ×1
DERMABOND ADVANCED .7 DNX12 (GAUZE/BANDAGES/DRESSINGS) ×1 IMPLANT
DIFFUSER DRILL AIR PNEUMATIC (MISCELLANEOUS) ×2 IMPLANT
DRAPE C-ARM 42X72 X-RAY (DRAPES) ×4 IMPLANT
DRAPE LAPAROTOMY 100X72 PEDS (DRAPES) ×2 IMPLANT
DRAPE MICROSCOPE LEICA (MISCELLANEOUS) IMPLANT
DRAPE SURG 17X23 STRL (DRAPES) ×8 IMPLANT
DRSG OPSITE POSTOP 4X8 (GAUZE/BANDAGES/DRESSINGS) ×2 IMPLANT
DURAPREP 6ML APPLICATOR 50/CS (WOUND CARE) ×2 IMPLANT
ELECT BLADE 4.0 EZ CLEAN MEGAD (MISCELLANEOUS) ×2
ELECT REM PT RETURN 9FT ADLT (ELECTROSURGICAL) ×2
ELECTRODE BLDE 4.0 EZ CLN MEGD (MISCELLANEOUS) ×1 IMPLANT
ELECTRODE REM PT RTRN 9FT ADLT (ELECTROSURGICAL) ×1 IMPLANT
EVACUATOR 1/8 PVC DRAIN (DRAIN) ×2 IMPLANT
GAUZE 4X4 16PLY RFD (DISPOSABLE) IMPLANT
GAUZE SPONGE 4X4 12PLY STRL (GAUZE/BANDAGES/DRESSINGS) ×2 IMPLANT
GLOVE BIO SURGEON STRL SZ7 (GLOVE) IMPLANT
GLOVE BIO SURGEON STRL SZ8 (GLOVE) ×2 IMPLANT
GLOVE BIOGEL PI IND STRL 7.0 (GLOVE) IMPLANT
GLOVE BIOGEL PI INDICATOR 7.0 (GLOVE)
GLOVE EXAM NITRILE LRG STRL (GLOVE) IMPLANT
GLOVE EXAM NITRILE XL STR (GLOVE) IMPLANT
GLOVE EXAM NITRILE XS STR PU (GLOVE) IMPLANT
GLOVE INDICATOR 8.5 STRL (GLOVE) ×2 IMPLANT
GOWN STRL REUS W/ TWL LRG LVL3 (GOWN DISPOSABLE) IMPLANT
GOWN STRL REUS W/ TWL XL LVL3 (GOWN DISPOSABLE) ×1 IMPLANT
GOWN STRL REUS W/TWL 2XL LVL3 (GOWN DISPOSABLE) IMPLANT
GOWN STRL REUS W/TWL LRG LVL3 (GOWN DISPOSABLE)
GOWN STRL REUS W/TWL XL LVL3 (GOWN DISPOSABLE) ×1
IMPL QUARTEX 3.5X12MM (Neuro Prosthesis/Implant) ×1 IMPLANT
IMPL QUARTEX 3.5X14MM (Neuro Prosthesis/Implant) ×5 IMPLANT
IMPLANT QUARTEX 3.5X12MM (Neuro Prosthesis/Implant) ×2 IMPLANT
IMPLANT QUARTEX 3.5X14MM (Neuro Prosthesis/Implant) ×10 IMPLANT
KIT BASIN OR (CUSTOM PROCEDURE TRAY) ×2 IMPLANT
KIT TURNOVER KIT B (KITS) ×2 IMPLANT
LOCKING CAP (Cap) ×16 IMPLANT
MARKER SKIN DUAL TIP RULER LAB (MISCELLANEOUS) ×2 IMPLANT
NEEDLE HYPO 25X1 1.5 SAFETY (NEEDLE) ×2 IMPLANT
NEEDLE SPNL 20GX3.5 QUINCKE YW (NEEDLE) ×2 IMPLANT
NS IRRIG 1000ML POUR BTL (IV SOLUTION) ×2 IMPLANT
OIL CARTRIDGE MAESTRO DRILL (MISCELLANEOUS) ×2
PACK LAMINECTOMY NEURO (CUSTOM PROCEDURE TRAY) ×2 IMPLANT
PAD ARMBOARD 7.5X6 YLW CONV (MISCELLANEOUS) ×6 IMPLANT
PIN MAYFIELD SKULL DISP (PIN) ×2 IMPLANT
ROD CVD 4X55 (Rod) ×4 IMPLANT
RUBBERBAND STERILE (MISCELLANEOUS) IMPLANT
SCREW 4.0X18MM (Screw) ×4 IMPLANT
SPONGE LAP 4X18 RFD (DISPOSABLE) IMPLANT
SPONGE SURGIFOAM ABS GEL 100 (HEMOSTASIS) ×2 IMPLANT
STRIP CLOSURE SKIN 1/2X4 (GAUZE/BANDAGES/DRESSINGS) ×2 IMPLANT
SUT ETHILON 4 0 PS 2 18 (SUTURE) IMPLANT
SUT VIC AB 0 CT1 18XCR BRD8 (SUTURE) ×1 IMPLANT
SUT VIC AB 0 CT1 8-18 (SUTURE) ×1
SUT VIC AB 2-0 CT1 18 (SUTURE) ×4 IMPLANT
SUT VIC AB 4-0 PS2 27 (SUTURE) ×2 IMPLANT
TOWEL GREEN STERILE (TOWEL DISPOSABLE) ×2 IMPLANT
TOWEL GREEN STERILE FF (TOWEL DISPOSABLE) ×2 IMPLANT
TRAY FOLEY MTR SLVR 16FR STAT (SET/KITS/TRAYS/PACK) ×2 IMPLANT
WATER STERILE IRR 1000ML POUR (IV SOLUTION) ×2 IMPLANT

## 2018-06-14 NOTE — Transfer of Care (Signed)
Immediate Anesthesia Transfer of Care Note  Patient: [name] [name]  Procedure(s) Performed: POSTERIOR CERVICAL DECOMPRESSION/FUSION CERVICAL TWO- CERVICAL THREE CERVICAL THREE- CERVICAL FOUR, CERVICAL FOUR- CERVICAL FIVE (N/A )  Patient Location: PACU  Anesthesia Type:General  Level of Consciousness: awake, alert  and oriented  Airway & Oxygen Therapy: Patient Spontanous Breathing and Patient connected to nasal cannula oxygen  Post-op Assessment: Report given to RN and Post -op Vital signs reviewed and stable  Post vital signs: Reviewed and stable  Last Vitals:  Vitals Value Taken Time  BP 134/94 06/14/2018  4:52 PM  Temp    Pulse 93 06/14/2018  4:55 PM  Resp 32 06/14/2018  4:55 PM  SpO2 98 % 06/14/2018  4:55 PM  Vitals shown include unvalidated device data.  Last Pain:  Vitals:   06/14/18 1000  TempSrc:   PainSc: 0-No pain      Patients Stated Pain Goal: 4 (06/13/18 2314)  Complications: No apparent anesthesia complications

## 2018-06-14 NOTE — Progress Notes (Signed)
Patient ID: [name] [name], male   DOB: 04/11/1966, 52 y.o.   MRN: [id number] Patient doing well this morning no new complaints still improving IV steroids.  Neurologically stable.  I have extensively gone over the risks and benefits of a posterior cervical decompression fusion from C2-C5.  As well as perioperative course expectations of outcome and alternatives of surgery and he understands and agrees to proceed forward.

## 2018-06-14 NOTE — Progress Notes (Signed)
Patient arrived from PACU. Oriented x4. Vitals stable.  Dressing CDI with hemovac coming from site. Foley in place.  3W called for patient belongings.  Dietary called to place dinner order. Continue to monitor patient.

## 2018-06-14 NOTE — Progress Notes (Addendum)
[facility] PROGRESS NOTE  [name] [name] ZOX:[id number] DOB: 09-17-1965 DOA: 06/06/2018  PCP: Devra Dopp, MD  Brief History/Interval Summary: 52 year old African-American male who underwent cervical spine surgery in July complicated by DVT and PE.  IVC filter was placed followed by hematology oncology.  Had an extended stay in rehabilitation.  Has been on anticoagulation.  Presented to an outside facility with increasing weakness possible pneumonia.  Was transferred here to [facility].  Has been having polyarthralgias.  Consultants: Neurosurgery.  Home discussion with rheumatology.  Procedures: Cervical spine surgery planned for today  Antibiotics: None  Subjective/Interval History: Patient states that he has been having joint pains.  These have improved over the last few days.  No clear diagnosis have been provided yet.  Otherwise he feels well.  Denies any chest pain or shortness of breath.  ROS: No nausea or vomiting  Objective:  Vital Signs  Vitals:   06/14/18 0000 06/14/18 0414 06/14/18 0754 06/14/18 1151  BP: 130/82 126/84 136/82   Pulse: 75 69 64   Resp: 18 18 18    Temp: 99.1 F (37.3 C) 98.6 F (37 C) 98.4 F (36.9 C)   TempSrc: Oral Oral Oral   SpO2: 99% 97% 99%   Weight:    123.8 kg  Height:    5' 9.02" (1.753 m)    Intake/Output Summary (Last 24 hours) at 06/14/2018 1353 Last data filed at 06/14/2018 0436 Gross per 24 hour  Intake 98.46 ml  Output 900 ml  Net -801.54 ml   Filed Weights   06/06/18 2246 06/14/18 1151  Weight: 123.8 kg 123.8 kg    General appearance: alert, cooperative, appears stated age and no distress Resp: clear to auscultation bilaterally Cardio: regular rate and rhythm, S1, S2 normal, no murmur, click, rub or gallop GI: soft, non-tender; bowel sounds normal; no masses,  no organomegaly Extremities: extremities normal, atraumatic, no cyanosis or edema Neurological: Alert and oriented x3.  No obvious focal neurological  deficits.  Lab Results:  Data Reviewed: I have personally reviewed following labs and imaging studies  CBC: Recent Labs  Lab 06/10/18 0423 06/11/18 0101 06/12/18 0119 06/13/18 0500 06/14/18 0559  WBC 16.6* 16.6* 13.6* 14.2* 10.8*  HGB 8.5* 8.6* 8.9* 9.1* 8.9*  HCT 27.1* 27.9* 29.4* 30.3* 28.6*  MCV 89.4 89.7 91.3 92.7 91.1  PLT 331 344 375 387 315    Basic Metabolic Panel: Recent Labs  Lab 06/12/18 0119 06/13/18 0500  NA 139 139  K 3.9 4.1  CL 106 107  CO2 24 25  GLUCOSE 198* 131*  BUN 21* 19  CREATININE 0.97 1.00  CALCIUM 8.9 9.0    GFR: Estimated Creatinine Clearance: 112.3 mL/min (by C-G formula based on SCr of 1 mg/dL).  Liver Function Tests: Recent Labs  Lab 06/12/18 0119 06/13/18 0500  AST 32 55*  ALT 27 47*  ALKPHOS 77 71  BILITOT 0.4 0.3  PROT 6.1* 6.3*  ALBUMIN 2.6* 2.5*    Cardiac Enzymes: Recent Labs  Lab 06/09/18 1409  CKTOTAL 60     Recent Results (from the past 240 hour(s))  Culture, blood (routine x 2)     Status: None   Collection Time: 06/07/18 12:23 AM  Result Value Ref Range Status   Specimen Description BLOOD RIGHT ANTECUBITAL  Final   Special Requests   Final    BOTTLES DRAWN AEROBIC ONLY Blood Culture results may not be optimal due to an inadequate volume of blood received in culture bottles   Culture  Final    NO GROWTH 5 DAYS Performed at [facility], [address], [place], [place] [address]    Report Status 06/12/2018 FINAL  Final  Culture, blood (routine x 2)     Status: None   Collection Time: 06/07/18 12:33 AM  Result Value Ref Range Status   Specimen Description BLOOD RIGHT HAND  Final   Special Requests   Final    BOTTLES DRAWN AEROBIC ONLY Blood Culture results may not be optimal due to an inadequate volume of blood received in culture bottles   Culture   Final    NO GROWTH 5 DAYS Performed at [facility], [address] [place], [place] [address]    Report Status 06/12/2018 FINAL  Final   Culture, Urine     Status: Abnormal   Collection Time: 06/07/18  8:34 AM  Result Value Ref Range Status   Specimen Description URINE, CATHETERIZED  Final   Special Requests NONE  Final   Culture (A)  Final    <10,000 COLONIES/mL INSIGNIFICANT GROWTH Performed at [facility], [address], [place], [place] [address]    Report Status 06/08/2018 FINAL  Final  Culture, blood (routine x 2)     Status: None   Collection Time: 06/08/18  3:03 PM  Result Value Ref Range Status   Specimen Description BLOOD RIGHT ANTECUBITAL  Final   Special Requests   Final    BOTTLES DRAWN AEROBIC AND ANAEROBIC Blood Culture results may not be optimal due to an excessive volume of blood received in culture bottles   Culture   Final    NO GROWTH 5 DAYS Performed at [facility], [address] [place], [place] [address]    Report Status 06/13/2018 FINAL  Final  Culture, blood (routine x 2)     Status: None   Collection Time: 06/08/18  3:03 PM  Result Value Ref Range Status   Specimen Description BLOOD RIGHT HAND  Final   Special Requests   Final    BOTTLES DRAWN AEROBIC AND ANAEROBIC Blood Culture results may not be optimal due to an excessive volume of blood received in culture bottles   Culture   Final    NO GROWTH 5 DAYS Performed at [facility], [address], [place], [place] [address]    Report Status 06/13/2018 FINAL  Final  Body fluid culture     Status: None   Collection Time: 06/09/18  2:12 PM  Result Value Ref Range Status   Specimen Description SYNOVIAL  Final   Special Requests RIGHT KNEE  Final   Gram Stain   Final    MODERATE WBC PRESENT, PREDOMINANTLY PMN NO ORGANISMS SEEN    Culture   Final    NO GROWTH 3 DAYS Performed at [facility], [address], [place], [place] [address]    Report Status 06/12/2018 FINAL  Final  MRSA PCR Screening     Status: None   Collection Time: 06/14/18  1:10 AM  Result Value Ref Range Status   MRSA by PCR NEGATIVE  NEGATIVE Final    Comment:        The GeneXpert MRSA Assay (FDA approved for NASAL specimens only), is one component of a comprehensive MRSA colonization surveillance program. It is not intended to diagnose MRSA infection nor to guide or monitor treatment for MRSA infections. Performed at [facility], [address], [place], [place] [address]  Surgical PCR screen     Status: Abnormal   Collection Time: 06/14/18  4:40 AM  Result Value Ref Range Status   MRSA, PCR NEGATIVE NEGATIVE Final   Staphylococcus aureus POSITIVE (A) NEGATIVE Final    Comment: (NOTE) The Xpert SA Assay (FDA approved for NASAL specimens in patients 81 years of age and older), is one component of a comprehensive surveillance program. It is not intended to diagnose infection nor to guide or monitor treatment. Performed at [facility], [address], [place], [place] [address]       Radiology Studies: No results found.   Medications:  Scheduled: . [MAR Hold] amLODipine  2.5 mg Oral Daily  . [MAR Hold] febuxostat  40 mg Oral Daily  . [MAR Hold] gabapentin  300 mg Oral TID  . [MAR Hold] Influenza vac split quadrivalent PF  0.5 mL Intramuscular Tomorrow-1000  . [MAR Hold] mupirocin ointment  1 application Nasal BID  . [MAR Hold] pantoprazole  40 mg Oral BID  . [MAR Hold] polyethylene glycol  17 g Oral BID  . [MAR Hold] predniSONE  20 mg Oral QAC breakfast   Continuous: . ceFAZolin    .  ceFAZolin (ANCEF) IV    . lactated ringers 10 mL/hr at 06/14/18 1217  . [MAR Hold] methocarbamol (ROBAXIN) IV 500 mg (06/10/18 1839)   PRN:[MAR Hold] acetaminophen **OR** [MAR Hold] acetaminophen, [MAR Hold] hydrALAZINE, [MAR Hold] methocarbamol (ROBAXIN) IV, [MAR Hold] methocarbamol, [MAR Hold]  morphine injection, [MAR Hold] ondansetron **OR** [MAR Hold] ondansetron (ZOFRAN) IV, [MAR Hold] oxyCODONE  Assessment/Plan:    Polyarthralgia/persistent fever and elevated inflammatory markers Patient  symptoms mainly involve her shoulders ankles.  He also has a history of gout as a result of which he has pain in his knees.  Has chronic lower back pain.  Reason for his polyarthralgia is not entirely clear.  Patient was found to have joint effusion in both knees.  Arthrocentesis was performed without any evidence for infection.  See below as well.  No significant fever in the last 24 hours.  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  Plan is for posterior decompression today.  Neurologically stable.  Bilateral knee pain status post arthrocentesis Possible gout versus pseudogout.  Now improved.  Polymyalgia and polyarthralgia CK level 60.  CRP 18.  ANCA negative.  Aldolase negative.  CCP weakly positive.  ANA negative.  Discussed with the rheumatologist Dr. Deanne Coffer (cell phone--[phone]).  Will need outpatient follow-up with him.  Patient is currently on steroids.  This will be continued to the patient has not a chance to follow-up with rheumatology.  Essential hypertension Stable.  Continue antihypertensive treatments.  Recent PE in the setting of spine surgery Patient status post IVC filter placement on July 16.  Patient was on apixaban.  Changed over to IV heparin.  No anticoagulation on hold for surgery today.  Will need to discuss with neurosurgery as to when this can be resumed.  History of gout On Uloric  Normocytic anemia No overt bleeding noted.  Continue to monitor.   DVT Prophylaxis: He was on IV heparin    Code Status: Full code Family Communication: Discussed with the patient Disposition Plan: Management as outlined above.    LOS: 7 days   Osvaldo Shipper  [facility] Pager [phone] 06/14/2018, 1:53 PM  If 7PM-7AM, please contact night-coverage at [email or web address], password [facility]

## 2018-06-14 NOTE — Progress Notes (Signed)
ANTICOAGULATION CONSULT NOTE   Pharmacy Consult for Heparin (Apixaban on hold) Indication: pulmonary embolus  Allergies  Allergen Reactions  . Nsaids Other (See Comments)    G6PD DEFICIENCY  . Sulfa Antibiotics Other (See Comments)    G6PD DEFICIENCY    Patient Measurements: Height: 5\' 9"  (175.3 cm) Weight: 272 lb 14.9 oz (123.8 kg) IBW/kg (Calculated) : 70.7  Vital Signs: Temp: 98.4 F (36.9 C) (10/02 0754) Temp Source: Oral (10/02 0754) BP: 136/82 (10/02 0754) Pulse Rate: 64 (10/02 0754)  Labs: Recent Labs    06/12/18 0119 06/13/18 0500 06/13/18 0508 06/14/18 0559  HGB 8.9* 9.1*  --  8.9*  HCT 29.4* 30.3*  --  28.6*  PLT 375 387  --  315  HEPARINUNFRC 0.40  --  0.39 <0.10*  CREATININE 0.97 1.00  --   --     Estimated Creatinine Clearance: 112.3 mL/min (by C-G formula based on SCr of 1 mg/dL).   Medical History: Past Medical History:  Diagnosis Date  . Degenerative disc disease, lumbar   . Gout   . History of pernicious anemia 1980   Patient says he takes OTC iron supplements QD  . Hypertension     Assessment: 52 y/o M with recent hx of bilateral PE/DVT, on apixaban PTA, holding apixaban and starting heparin, last dose apixaban >12 hours ago per pt, OK to start heparin now, Hgb 9.7, plts good, renal function good.   Off heparin since 10/1 @2300  for planned laminectomy today at 1200.  CBC stable, no bleeding noted.  Goal of Therapy:  Heparin level 0.3-0.7 units/mL Monitor platelets by anticoagulation protocol: Yes   Plan:  F/u with Neurosurgery and primary for appropriate restart of anticoagulation s/p laminectomy  Daylene Posey, PharmD Clinical Pharmacist Please check AMION for all [facility] Pharmacy numbers 06/14/2018 8:40 AM

## 2018-06-14 NOTE — Progress Notes (Addendum)
OT Cancellation Note  Patient Details Name: [name] [name] MRN: [id number] DOB: 11/03/1965   Cancelled Treatment:    Reason Eval/Treat Not Completed: Patient at procedure or test/ unavailable(pt in OR ). Will continue to follow.   Chancy Milroy, OT Acute Rehabilitation Services Pager [phone] Office [phone]   Chancy Milroy 06/14/2018, 3:40 PM

## 2018-06-14 NOTE — Anesthesia Procedure Notes (Signed)
Procedure Name: Intubation Date/Time: 06/14/2018 1:33 PM Performed by: Elliot Dally, CRNA Pre-anesthesia Checklist: Patient identified, Emergency Drugs available, Suction available and Patient being monitored Patient Re-evaluated:Patient Re-evaluated prior to induction Oxygen Delivery Method: Circle System Utilized Preoxygenation: Pre-oxygenation with 100% oxygen Induction Type: IV induction Ventilation: Mask ventilation without difficulty and Oral airway inserted - appropriate to patient size Laryngoscope Size: Glidescope and 4 Grade View: Grade I Tube type: Oral Tube size: 7.5 mm Number of attempts: 1 Airway Equipment and Method: Stylet and Oral airway Placement Confirmation: ETT inserted through vocal cords under direct vision,  positive ETCO2 and breath sounds checked- equal and bilateral Secured at: 22 cm Tube secured with: Tape Dental Injury: Teeth and Oropharynx as per pre-operative assessment

## 2018-06-14 NOTE — Progress Notes (Signed)
Called report to OR, transport on way for patient

## 2018-06-14 NOTE — Anesthesia Preprocedure Evaluation (Signed)
Anesthesia Evaluation  Patient identified by MRN, date of birth, ID band Patient awake    Reviewed: Allergy & Precautions, NPO status , Patient's Chart, lab work & pertinent test results, reviewed documented beta blocker date and time   History of Anesthesia Complications Negative for: history of anesthetic complications  Airway Mallampati: III  TM Distance: >3 FB Neck ROM: Limited   Comment: Expanding neck hematoma Dental  (+) Teeth Intact, Dental Advisory Given   Pulmonary shortness of breath, PE   breath sounds clear to auscultation       Cardiovascular hypertension, + DVT (03/27/18 Bilateral lower extremities are positive for acute deep vein thrombosis involving bilateral posterior tibial and peroneal veins)   Rhythm:Regular Rate:Normal  03/27/18 ECHO: EF 50-55%, valves OK   Neuro/Psych PSYCHIATRIC DISORDERS Anxiety S/p Anterior cervical corpectomy 03/24/18    GI/Hepatic negative GI ROS, Neg liver ROS,   Endo/Other  Morbid obesity  Renal/GU negative Renal ROS     Musculoskeletal  (+) Arthritis ,   Abdominal (+) + obese,   Peds  Hematology  (+) anemia , plt 106k Heparin infusion stopped   Anesthesia Other Findings   Reproductive/Obstetrics                             Anesthesia Physical Anesthesia Plan  ASA: II  Anesthesia Plan: General   Post-op Pain Management:    Induction: Intravenous  PONV Risk Score and Plan: 2 and Ondansetron and Dexamethasone  Airway Management Planned: Oral ETT  Additional Equipment: None  Intra-op Plan:   Post-operative Plan: Extubation in OR  Informed Consent: I have reviewed the patients History and Physical, chart, labs and discussed the procedure including the risks, benefits and alternatives for the proposed anesthesia with the patient or authorized representative who has indicated his/her understanding and acceptance.   Dental advisory  given  Plan Discussed with: CRNA and Surgeon  Anesthesia Plan Comments:         Anesthesia Quick Evaluation

## 2018-06-14 NOTE — Op Note (Signed)
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  2.  Posterior cervical fusion with lateral mass screws at C3-C4-C5 and pars screws at C2 lies in the globus ellipse Lateral mass screw system  3.  Posterior lateral arthrodesis C2-C5 utilizing locally harvested autograft mixed with vivigen  Surgeon: Damien Fusi Assistant: Coletta Memos  Anesthesia: General  EBL: Minimal  HPI: 52 year old gentleman with severe cervical stenosis and OPLL.  Patient underwent C4 corpectomy back in July who is recovering very well but then developed an inflammatory arthritic the requiring readmission to the hospital and rule out for infection.  Follow-up imaging showed persistent stenosis at C3-4 as well as stenosis from C3 down to C5.  Due to patient's persistent stenosis and exam consistent with myelopathy I recommended posterior decompression and fusion from C2-C5.  I extensively went over the risks and benefits of the operation with him as well as perioperative course expectations of outcome and alternatives of surgery and he understood and agreed to proceed forward.  Operative procedure: Patient brought into the ER was induced under general anesthesia positioned prone in pins the neck in slight flexion backside of his head and neck was shaved prepped and draped in routine sterile fashion after infiltration of 10 cc lidocaine with epi midline incision was made and Bovie left cautery was used to take down the subcutaneous tissue and subperiosteal dissection was carried lamina of C2, C3, C4, and C5.  Intraoperative x-ray confirmed indications C2-3 disc space so at this point lateral mass screws were placed in routine fashion at C3, C4, and C5 with entry points in  the inferomedial quadrant extending onto the superolateral quadrant with 14 mm globus lip screws placed at all 3 levels.  I then remove the spinous process of C3-C4 and C5 performed central decompression by marching down the lateral gutters and removing the central lamina en bloc to minimize any manipulation of the spinal cord.  There was marked stenosis primarily on the left at C3-4 with a large posterior spur this was all unroofed I was able to palpate the pedicles and the foramina of C3, C4, C5 to confirm adequate decompression.  I also exposed performed laminotomy of the inferior aspect of 2 3 identified and palpated the pars and pedicle of C2 and then under fluoroscopy placed to pars screws and C2 18 mm in length all screws had excellent purchase.  Then selected 55 mm rods anchored everything in place with top tightening knots copiously irrigated the wound and then aggressively decorticated the facet joints and lateral masses and packed the local autograft mix mixed with DBX mix posterior laterally from C2-C5.  Placed a medium Hemovac drain Gelfoam overlaid top of the dura and the wound was closed in layers with empty Vicryl running 4 subcuticular Dermabond benzoin Steri-Strips and a sterile dressing was applied patient recovery in stable condition.  At the end the case all needle counts sponge counts were correct.

## 2018-06-15 ENCOUNTER — Encounter (HOSPITAL_COMMUNITY): Payer: Self-pay | Admitting: Neurosurgery

## 2018-06-15 DIAGNOSIS — Z8739 Personal history of other diseases of the musculoskeletal system and connective tissue: Secondary | ICD-10-CM

## 2018-06-15 LAB — CBC
HCT: 30.1 % — ABNORMAL LOW (ref 39.0–52.0)
Hemoglobin: 9.3 g/dL — ABNORMAL LOW (ref 13.0–17.0)
MCH: 28.6 pg (ref 26.0–34.0)
MCHC: 30.9 g/dL (ref 30.0–36.0)
MCV: 92.6 fL (ref 78.0–100.0)
Platelets: 241 10*3/uL (ref 150–400)
RBC: 3.25 MIL/uL — AB (ref 4.22–5.81)
RDW: 15.3 % (ref 11.5–15.5)
WBC: 15.8 10*3/uL — ABNORMAL HIGH (ref 4.0–10.5)

## 2018-06-15 LAB — BASIC METABOLIC PANEL
ANION GAP: 11 (ref 5–15)
BUN: 15 mg/dL (ref 6–20)
CO2: 23 mmol/L (ref 22–32)
Calcium: 9.2 mg/dL (ref 8.9–10.3)
Chloride: 102 mmol/L (ref 98–111)
Creatinine, Ser: 1.03 mg/dL (ref 0.61–1.24)
GFR calc non Af Amer: >60 mL/min (ref >60)
Glucose, Bld: 181 mg/dL — ABNORMAL HIGH (ref 70–99)
POTASSIUM: 4.5 mmol/L (ref 3.5–5.1)
SODIUM: 136 mmol/L (ref 135–145)

## 2018-06-15 LAB — HEPARIN LEVEL (UNFRACTIONATED)

## 2018-06-15 MED ORDER — DEXAMETHASONE SODIUM PHOSPHATE 10 MG/ML IJ SOLN
6.0000 mg | Freq: Four times a day (QID) | INTRAMUSCULAR | Status: DC
  Administered 2018-06-15 – 2018-06-16 (×4): 6 mg via INTRAVENOUS
  Filled 2018-06-15 (×5): qty 1

## 2018-06-15 MED ORDER — METHOCARBAMOL 500 MG PO TABS
750.0000 mg | ORAL_TABLET | Freq: Four times a day (QID) | ORAL | Status: DC
  Administered 2018-06-15 – 2018-06-17 (×11): 750 mg via ORAL
  Administered 2018-06-17: 250 mg via ORAL
  Administered 2018-06-18 – 2018-08-09 (×189): 750 mg via ORAL
  Filled 2018-06-15 (×8): qty 2
  Filled 2018-06-15: qty 1.5
  Filled 2018-06-15 (×10): qty 2
  Filled 2018-06-15: qty 1.5
  Filled 2018-06-15: qty 2
  Filled 2018-06-15: qty 1.5
  Filled 2018-06-15 (×47): qty 2
  Filled 2018-06-15: qty 1.5
  Filled 2018-06-15 (×40): qty 2
  Filled 2018-06-15: qty 1.5
  Filled 2018-06-15 (×13): qty 2
  Filled 2018-06-15: qty 1.5
  Filled 2018-06-15 (×18): qty 2
  Filled 2018-06-15: qty 1.5
  Filled 2018-06-15 (×7): qty 2
  Filled 2018-06-15: qty 1.5
  Filled 2018-06-15 (×13): qty 2
  Filled 2018-06-15: qty 1.5
  Filled 2018-06-15 (×36): qty 2

## 2018-06-15 MED ORDER — OXYCODONE HCL 5 MG PO TABS
20.0000 mg | ORAL_TABLET | ORAL | Status: DC | PRN
  Administered 2018-06-16 – 2018-07-02 (×41): 20 mg via ORAL
  Filled 2018-06-15 (×42): qty 4

## 2018-06-15 NOTE — Progress Notes (Signed)
Orthopedic Tech Progress Note Patient Details:  [name] [name] [birth year] [id number]  Patient ID: [name], male   DOB: [birth year], 52 y.o.   MRN: [id number]   Nikki Dom 06/15/2018, 2:05 PM Called in bio-tech brace order; spoke with Wylene Men

## 2018-06-15 NOTE — Progress Notes (Signed)
Pt offered bath x2 attempts. Pt states he does not feel like being bothered at this time. Pt states he will take bath at later time.

## 2018-06-15 NOTE — Progress Notes (Signed)
Subjective: Patient reports Patient doing well significant improvement upper and lower extremities.  Condition of neck pain.  Objective: Vital signs in last 24 hours: Temp:  [97.5 F (36.4 C)-98.4 F (36.9 C)] 98 F (36.7 C) (10/03 0300) Pulse Rate:  [64-93] 91 (10/03 0400) Resp:  [8-32] 17 (10/02 1810) BP: (127-138)/(82-98) 137/98 (10/03 0400) SpO2:  [93 %-100 %] 99 % (10/03 0300) Weight:  [123.8 kg] 123.8 kg (10/02 1151)  Intake/Output from previous day: 10/02 0701 - 10/03 0700 In: 1400 [I.V.:1300; IV Piggyback:100] Out: 1275 [Urine:1050; Drains:25; Blood:200] Intake/Output this shift: No intake/output data recorded.  Strength 4+ to 5 out of 5 incision clean dry and intact  Lab Results: Recent Labs    06/14/18 0559 06/15/18 0324  WBC 10.8* 15.8*  HGB 8.9* 9.3*  HCT 28.6* 30.1*  PLT 315 241   BMET Recent Labs    06/13/18 0500 06/15/18 0324  NA 139 136  K 4.1 4.5  CL 107 102  CO2 25 23  GLUCOSE 131* 181*  BUN 19 15  CREATININE 1.00 1.03  CALCIUM 9.0 9.2    Studies/Results: Dg Cervical Spine 2 Or 3 Views  Result Date: 06/14/2018 CLINICAL DATA:  Posterior cervical spine fusion EXAM: CERVICAL SPINE - 2-3 VIEW COMPARISON:  CT cervical spine of 06/07/2017 FINDINGS: Two C-arm spot films were returned. These images show hardware placed for posterior fusion at C2-C3 C4 and by history C5 as well. The C4-5 area is not well seen on the C-arm images obtained. IMPRESSION: Hardware placed for posterior fusion from C2 to to C5 although the C5 level is not well seen. Electronically Signed   By: Dwyane Dee M.D.   On: 06/14/2018 16:06   Dg C-arm 1-60 Min  Result Date: 06/14/2018 CLINICAL DATA:  Posterior fusion from C2-C5 EXAM: DG C-ARM 61-120 MIN COMPARISON:  CT C-spine of 06/07/2017 FINDINGS: C-arm fluoroscopy was provided during posterior fusion from C2-C5 by history. Fluoroscopy time of 40 seconds was recorded. IMPRESSION: C-arm fluoroscopy provided. Electronically Signed    By: Dwyane Dee M.D.   On: 06/14/2018 16:07    Assessment/Plan: Mobilize with physical occupational therapy make some adjustments changes muscle relaxer around increase his pain medication.  LOS: 8 days     Peyton Spengler P 06/15/2018, 7:34 AM

## 2018-06-15 NOTE — Anesthesia Postprocedure Evaluation (Signed)
Anesthesia Post Note  Patient: [name] [name]  Procedure(s) Performed: POSTERIOR CERVICAL DECOMPRESSION/FUSION CERVICAL TWO- CERVICAL THREE CERVICAL THREE- CERVICAL FOUR, CERVICAL FOUR- CERVICAL FIVE (N/A )     Patient location during evaluation: PACU Anesthesia Type: General Level of consciousness: awake and alert Pain management: pain level controlled Vital Signs Assessment: post-procedure vital signs reviewed and stable Respiratory status: spontaneous breathing, nonlabored ventilation, respiratory function stable and patient connected to nasal cannula oxygen Cardiovascular status: blood pressure returned to baseline and stable Postop Assessment: no apparent nausea or vomiting Anesthetic complications: no    Last Vitals:  Vitals:   06/15/18 0400 06/15/18 0822  BP: (!) 137/98   Pulse: 91   Resp:    Temp:  36.8 C  SpO2:      Last Pain:  Vitals:   06/15/18 0822  TempSrc: Oral  PainSc:                  Demarrion Meiklejohn

## 2018-06-15 NOTE — Social Work (Signed)
CSW acknowledging consult for SNF placement. Will follow for therapy recommendations.   Labrina Lines H Lasonia Casino, LCSWA [facility] Clinical Social Work [phone]   

## 2018-06-15 NOTE — Progress Notes (Signed)
Physical Therapy Treatment Patient Details Name: [name] [name] MRN: [id number] DOB: 01/14/1966 Today's Date: 06/15/2018    History of Present Illness  52 y.o. male with recent ACDF at C3 C5 by Dr. Wynetta Emery in July 2019 complicated by pulmonary embolism was placed on apixaban subsequently discharged to rehab and then SNF.  Pt underwent posterior cervical decompressive laminectomy, foraminotomies, and cervical fusion C3,C4, C5 06/14/18.  PMH including gout, HTN, and pulmonary embolism. Pt presenting post fall after his legs gave away after getting out of the car. MRI of cervical and lumbar spine showing no change or infection per Dr. Wynetta Emery. MR of right knee showing complex tear of the posterior horn of the medial meniscus. Right knee aspirated on 06/09/18.     PT Comments    Pt shows improvement in quad strength bil including translating to standing in the [place] where pt able to unlock each knee simultaneously, squat mildly and return to upright posture with knees extended and no additional assist    Follow Up Recommendations  CIR     Equipment Recommendations  Other (comment)(tba)    Recommendations for Other Services       Precautions / Restrictions Precautions Precautions: Fall;Cervical Precaution Comments: reviewed cervical precautions  Required Braces or Orthoses: Cervical Brace Knee Immobilizer - Right: On when out of bed or walking Cervical Brace: Hard collar Other Brace/Splint: Pt also reports he uses a Bledsoe brace for Rt LE to reduce risk of knee buckling and falls.  Unable to locate Bledsoe brace at this time.  Did contact floor where pt was last, and they do not have brace.  Have contacted MD for order for new brace  Restrictions Weight Bearing Restrictions: No    Mobility  Bed Mobility Overal bed mobility: Needs Assistance Bed Mobility: Rolling;Sidelying to Sit;Sit to Supine Rolling: Mod assist Sidelying to sit: Max assist   Sit to supine: Max assist;+2 for physical  assistance      Transfers Overall transfer level: Needs assistance Equipment used: Ambulation equipment used Transfers: Sit to/from [employer] Sit to Stand: Mod assist;+2 safety/equipment;From elevated surface         General transfer comment: unable to attempt transfer due to symptomatic orthostasis and need to return to supine.  Pt very nervous about moving.  He required assist to move into standing in [place]   Ambulation/Gait             General Gait Details: not appropriate yet   Stairs             Wheelchair Mobility    Modified Rankin (Stroke Patients Only)       Balance Overall balance assessment: Needs assistance Sitting-balance support: Feet supported Sitting balance-Leahy Scale: Fair Sitting balance - Comments: Pt very guarded due to neck pain    Standing balance support: Bilateral upper extremity supported Standing balance-Leahy Scale: Poor Standing balance comment: Pt reliant on bil. UE support and min A.  Second person for safety.  After returning to sitting in the [place], pt reported feeling faint and was assisted to supine and briefly placed in trendelenburg position.   Pt with brief period of  syncopal BP 92/59, 02 sats 95% on 2L supplemental 02                              Cognition Arousal/Alertness: Awake/alert Behavior During Therapy: Anxious Overall Cognitive Status: Within Functional Limits for tasks assessed  General Comments: Pt very nervious about knee buckling       Exercises Other Exercises Other Exercises: bil LE ROM exercise and assessment of strength at knees--grossly 3+ BIL    General Comments General comments (skin integrity, edema, etc.): limited by symptomatic orthostatics 92/59      Pertinent Vitals/Pain Pain Assessment: 0-10 Faces Pain Scale: Hurts whole lot Pain Location: neck  Pain Descriptors / Indicators: Operative site  guarding;Shooting Pain Intervention(s): Monitored during session    Home Living Family/patient expects to be discharged to:: Inpatient rehab Living Arrangements: Parent             Additional Comments: Pt was previously at CIR then SNF     Prior Function Level of Independence: Needs assistance  Gait / Transfers Assistance Needed: Since rehab and accident, has been using RW and knee brace ADL's / Homemaking Assistance Needed: Using AE for sock/shoe management. Performs ADLs independently. Family performs IADLs. 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    PT Goals (current goals can now be found in the care plan section) Acute Rehab PT Goals Patient Stated Goal: to be able to walk again  PT Goal Formulation: With patient Time For Goal Achievement: 06/26/18 Potential to Achieve Goals: Good Progress towards PT goals: Progressing toward goals    Frequency    Min 3X/week      PT Plan Current plan remains appropriate    Co-evaluation              AM-PAC PT "6 Clicks" Daily Activity  Outcome Measure  Difficulty turning over in bed (including adjusting bedclothes, sheets and blankets)?: Unable Difficulty moving from lying on back to sitting on the side of the bed? : Unable Difficulty sitting down on and standing up from a chair with arms (e.g., wheelchair, bedside commode, etc,.)?: Unable Help needed moving to and from a bed to chair (including a wheelchair)?: A Lot Help needed walking in hospital room?: A Lot Help needed climbing 3-5 steps with a railing? : Total 6 Click Score: 8    End of Session     Patient left: in bed;with call bell/phone within reach Nurse Communication: Mobility status PT Visit Diagnosis: Muscle weakness (generalized) (M62.81);Difficulty in walking, not elsewhere classified (R26.2);Other symptoms and signs involving the nervous system (R29.898)     Time: 1610-9604 PT Time Calculation (min) (ACUTE  ONLY): 57 min  Charges:  $Therapeutic Activity: 23-37 mins                     06/15/2018  Harmon Bing, PT Acute Rehabilitation Services [phone]  (pager) [phone]  (office)   [name] Katrice Goel 06/15/2018, 2:28 PM

## 2018-06-15 NOTE — Evaluation (Signed)
Occupational Therapy Re-Evaluation Patient Details Name: [name] [name] MRN: [id number] DOB: Apr 14, 1966 Today's Date: 06/15/2018    History of Present Illness  52 y.o. male with recent ACDF at C3 C5 by Dr. Wynetta Emery in July 2019 complicated by pulmonary embolism was placed on apixaban subsequently discharged to rehab and then SNF.  Pt underwent posterior cervical decompressive laminectomy, foraminotomies, and cervical fusion C3,C4, C5 06/14/18.  PMH including gout, HTN, and pulmonary embolism. Pt presenting post fall after his legs gave away after getting out of the car. MRI of cervical and lumbar spine showing no change or infection per Dr. Wynetta Emery. MR of right knee showing complex tear of the posterior horn of the medial meniscus. Right knee aspirated on 06/09/18.    Clinical Impression   Pt is now s/p posterior cervical fusion.  OT reeval completed.  He was able to move to EOB with max A, stood with mod A (second person for safety), and stood with min A in the stedy.  He was unable to perform transfer to chair due to orthostatic hypotension and subsequent brief period of syncope.  Pt was returned to supine with max  A+2 and placed in trendelenburg position.  BP 92/59; HR 59.   RN notified.  Pain 8/10 in neck. Overall, he requires mod - total A for ADLs.   Goals and treatment plan remain appropriate.  Recommend CIR.     Follow Up Recommendations  CIR;Supervision/Assistance - 24 hour    Equipment Recommendations  None recommended by OT    Recommendations for Other Services Rehab consult     Precautions / Restrictions Precautions Precautions: Fall;Cervical Precaution Comments: reviewed cervical precautions  Required Braces or Orthoses: Cervical Brace Knee Immobilizer - Right: On when out of bed or walking Cervical Brace: Hard collar Other Brace/Splint: Pt also reports he uses a Bledsoe brace for Rt LE to reduce risk of knee buckling and falls.  Unable to locate Bledsoe brace at this time.  Did  contact floor where pt was last, and they do not have brace.  Have contacted MD for order for new brace  Restrictions Weight Bearing Restrictions: No      Mobility Bed Mobility Overal bed mobility: Needs Assistance Bed Mobility: Rolling;Sidelying to Sit;Sit to Supine Rolling: Mod assist Sidelying to sit: Max assist   Sit to supine: Max assist;+2 for physical assistance      Transfers Overall transfer level: Needs assistance Equipment used: Ambulation equipment used Transfers: Sit to/from [employer] Sit to Stand: Mod assist;+2 safety/equipment;From elevated surface         General transfer comment: unable to attempt transfer due to symptomatic orthostasis and need to return to supine.  Pt very nervous about moving.  He required assist to move into standing in [place]     Balance Overall balance assessment: Needs assistance Sitting-balance support: Feet supported Sitting balance-Leahy Scale: Fair Sitting balance - Comments: Pt very guarded due to neck pain    Standing balance support: Bilateral upper extremity supported Standing balance-Leahy Scale: Poor Standing balance comment: Pt reliant on bil. UE support and min A.  Second person for safety.  After returning to sitting in the [place], pt reported feeling faint and was assisted to supine and briefly placed in trendelenburg position.   Pt with brief period of  syncopal BP 92/59, 02 sats 95% on 2L supplemental 02  ADL either performed or assessed with clinical judgement   ADL Overall ADL's : Needs assistance/impaired Eating/Feeding: Set up;Supervision/ safety;Sitting   Grooming: Set up;Supervision/safety;Bed level   Upper Body Bathing: Moderate assistance;Bed level   Lower Body Bathing: Maximal assistance;Bed level   Upper Body Dressing : Maximal assistance;Sitting   Lower Body Dressing: Total assistance;Sit to/from stand   Toilet Transfer: Total  assistance Toilet Transfer Details (indicate cue type and reason): unable to attempt at this time due to orthostatic hyposte Toileting- Clothing Manipulation and Hygiene: Total assistance;Bed level       Functional mobility during ADLs: Total assistance General ADL Comments: Activity limited by pain and orthostasis      Vision         Perception     Praxis      Pertinent Vitals/Pain Pain Assessment: 0-10 Faces Pain Scale: Hurts whole lot Pain Location: neck  Pain Descriptors / Indicators: Operative site guarding;Shooting Pain Intervention(s): Monitored during session;Limited activity within patient's tolerance;Repositioned     Hand Dominance Right   Extremity/Trunk Assessment Upper Extremity Assessment Upper Extremity Assessment: Generalized weakness(did not MMT due to pain and cervical precautions )   Lower Extremity Assessment Lower Extremity Assessment: Defer to PT evaluation   Cervical / Trunk Assessment Cervical / Trunk Assessment: Other exceptions Cervical / Trunk Exceptions: cervical collar    Communication Communication Communication: No difficulties   Cognition Arousal/Alertness: Awake/alert Behavior During Therapy: Anxious Overall Cognitive Status: Within Functional Limits for tasks assessed                                 General Comments: Pt very nervious about knee buckling    General Comments       Exercises     Shoulder Instructions      Home Living Family/patient expects to be discharged to:: Inpatient rehab Living Arrangements: Parent                               Additional Comments: Pt was previously at CIR then SNF       Prior Functioning/Environment Level of Independence: Needs assistance  Gait / Transfers Assistance Needed: Since rehab and accident, has been using RW and knee brace ADL's / Homemaking Assistance Needed: Using AE for sock/shoe management. Performs ADLs independently. Family performs  IADLs.   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         OT Problem List: Decreased strength;Decreased activity tolerance;Impaired balance (sitting and/or standing);Decreased knowledge of use of DME or AE;Decreased knowledge of precautions;Cardiopulmonary status limiting activity;Obesity;Pain      OT Treatment/Interventions: Self-care/ADL training;Therapeutic exercise;DME and/or AE instruction;Therapeutic activities;Visual/perceptual remediation/compensation;Patient/family education;Balance training    OT Goals(Current goals can be found in the care plan section) Acute Rehab OT Goals Patient Stated Goal: to be able to walk again  OT Goal Formulation: With patient Time For Goal Achievement: 06/29/18 Potential to Achieve Goals: Good  OT Frequency: Min 2X/week   Barriers to D/C: Decreased caregiver support          Co-evaluation              AM-PAC PT "6 Clicks" Daily Activity     Outcome Measure Help from another person eating meals?: None Help from another person taking care of personal grooming?: A Little Help from another person toileting, which includes using toliet, bedpan, or  urinal?: Total Help from another person bathing (including washing, rinsing, drying)?: A Lot Help from another person to put on and taking off regular upper body clothing?: A Lot Help from another person to put on and taking off regular lower body clothing?: Total 6 Click Score: 13   End of Session Equipment Utilized During Treatment: Oxygen;Other (comment);Cervical collar(stedy) Nurse Communication: Mobility status;Other (comment)(syncope due to orthostatic hypotension )  Activity Tolerance: Treatment limited secondary to medical complications (Comment)(orthostatic hypotension ) Patient left: in bed;with call bell/phone within reach  OT Visit Diagnosis: Unsteadiness on feet (R26.81);Pain;Muscle weakness (generalized) (M62.81) Pain - part of body: (neck )                 Time: 1610-9604 OT Time Calculation (min): 59 min Charges:  OT General Charges $OT Visit: 1 Visit OT Evaluation $OT Eval Moderate Complexity: 1 Mod OT Treatments $Therapeutic Activity: 8-22 mins  Jeani Hawking, OTR/L Acute Rehabilitation Services Pager [phone] Office [phone]   Jeani Hawking M 06/15/2018, 1:13 PM

## 2018-06-15 NOTE — Plan of Care (Signed)

## 2018-06-15 NOTE — Progress Notes (Signed)
[facility] PROGRESS NOTE  [name] [name] ZOX:[id number] DOB: 1966/03/29 DOA: 06/06/2018  PCP: Devra Dopp, MD  Brief History/Interval Summary: 52 year old African-American male who underwent cervical spine surgery in July complicated by DVT and PE.  IVC filter was placed followed by hematology oncology.  Had an extended stay in rehabilitation.  Has been on anticoagulation.  Presented to an outside facility with increasing weakness possible pneumonia.  Was transferred here to [facility].  Has been having polyarthralgias.  Consultants: Neurosurgery.  Home discussion with rheumatology.  Procedures:  On 10/2 1. posterior cervical decompressive laminectomy at C3, C4, C5 with removal of the spinous process and complete laminectomy and foraminotomies of the C3, C4, C5 nerve roots. 2.  Posterior cervical fusion with lateral mass screws at C3-C4-C5 and pars screws at C2 lies in the globus ellipse Lateral mass screw system 3.  Posterior lateral arthrodesis C2-C5 utilizing locally harvested autograft mixed with vivigen  Antibiotics: None  Subjective/Interval History: Patient complains of pain in his neck area from recent surgery.  Otherwise he states that he is feeling better.  To strengthen his legs and arms have improved.    ROS: He denies any chest pain or shortness of breath.  No nausea or vomiting  Objective:  Vital Signs  Vitals:   06/15/18 0300 06/15/18 0400 06/15/18 0822 06/15/18 0945  BP: (!) 137/98 (!) 137/98  (!) 149/97  Pulse: 86 91    Resp:      Temp: 98 F (36.7 C)  98.3 F (36.8 C)   TempSrc: Oral  Oral   SpO2: 99%     Weight:      Height:        Intake/Output Summary (Last 24 hours) at 06/15/2018 1046 Last data filed at 06/15/2018 0700 Gross per 24 hour  Intake 1400 ml  Output 1275 ml  Net 125 ml   Filed Weights   06/06/18 2246 06/14/18 1151  Weight: 123.8 kg 123.8 kg    General appearance: Awake alert.  In no distress. Resp: Normal effort at  rest.  Clear to auscultation bilaterally Cardio: S1-S2 is normal regular.  No S3-S4.  No rubs murmurs or bruit GI: Abdomen soft.  Nontender nondistended.  Bowel sounds are present normal.  No masses organomegaly. Extremities: No edema Neurological: Alert and oriented x3.  No obvious focal neurological deficits.  Lab Results:  Data Reviewed: I have personally reviewed following labs and imaging studies  CBC: Recent Labs  Lab 06/11/18 0101 06/12/18 0119 06/13/18 0500 06/14/18 0559 06/15/18 0324  WBC 16.6* 13.6* 14.2* 10.8* 15.8*  HGB 8.6* 8.9* 9.1* 8.9* 9.3*  HCT 27.9* 29.4* 30.3* 28.6* 30.1*  MCV 89.7 91.3 92.7 91.1 92.6  PLT 344 375 387 315 241    Basic Metabolic Panel: Recent Labs  Lab 06/12/18 0119 06/13/18 0500 06/15/18 0324  NA 139 139 136  K 3.9 4.1 4.5  CL 106 107 102  CO2 24 25 23   GLUCOSE 198* 131* 181*  BUN 21* 19 15  CREATININE 0.97 1.00 1.03  CALCIUM 8.9 9.0 9.2    GFR: Estimated Creatinine Clearance: 109.1 mL/min (by C-G formula based on SCr of 1.03 mg/dL).  Liver Function Tests: Recent Labs  Lab 06/12/18 0119 06/13/18 0500  AST 32 55*  ALT 27 47*  ALKPHOS 77 71  BILITOT 0.4 0.3  PROT 6.1* 6.3*  ALBUMIN 2.6* 2.5*    Cardiac Enzymes: Recent Labs  Lab 06/09/18 1409  CKTOTAL 60     Recent Results (from the past 240  hour(s))  Culture, blood (routine x 2)     Status: None   Collection Time: 06/07/18 12:23 AM  Result Value Ref Range Status   Specimen Description BLOOD RIGHT ANTECUBITAL  Final   Special Requests   Final    BOTTLES DRAWN AEROBIC ONLY Blood Culture results may not be optimal due to an inadequate volume of blood received in culture bottles   Culture   Final    NO GROWTH 5 DAYS Performed at [facility], [address] [place], [place] [address]    Report Status 06/12/2018 FINAL  Final  Culture, blood (routine x 2)     Status: None   Collection Time: 06/07/18 12:33 AM  Result Value Ref Range Status   Specimen  Description BLOOD RIGHT HAND  Final   Special Requests   Final    BOTTLES DRAWN AEROBIC ONLY Blood Culture results may not be optimal due to an inadequate volume of blood received in culture bottles   Culture   Final    NO GROWTH 5 DAYS Performed at [facility], [address], [place], [place] [address]    Report Status 06/12/2018 FINAL  Final  Culture, Urine     Status: Abnormal   Collection Time: 06/07/18  8:34 AM  Result Value Ref Range Status   Specimen Description URINE, CATHETERIZED  Final   Special Requests NONE  Final   Culture (A)  Final    <10,000 COLONIES/mL INSIGNIFICANT GROWTH Performed at [facility], [address] [place], [place] [address]    Report Status 06/08/2018 FINAL  Final  Culture, blood (routine x 2)     Status: None   Collection Time: 06/08/18  3:03 PM  Result Value Ref Range Status   Specimen Description BLOOD RIGHT ANTECUBITAL  Final   Special Requests   Final    BOTTLES DRAWN AEROBIC AND ANAEROBIC Blood Culture results may not be optimal due to an excessive volume of blood received in culture bottles   Culture   Final    NO GROWTH 5 DAYS Performed at [facility], [address], [place], [place] [address]    Report Status 06/13/2018 FINAL  Final  Culture, blood (routine x 2)     Status: None   Collection Time: 06/08/18  3:03 PM  Result Value Ref Range Status   Specimen Description BLOOD RIGHT HAND  Final   Special Requests   Final    BOTTLES DRAWN AEROBIC AND ANAEROBIC Blood Culture results may not be optimal due to an excessive volume of blood received in culture bottles   Culture   Final    NO GROWTH 5 DAYS Performed at [facility], [address] [place], [place] [address]    Report Status 06/13/2018 FINAL  Final  Body fluid culture     Status: None   Collection Time: 06/09/18  2:12 PM  Result Value Ref Range Status   Specimen Description SYNOVIAL  Final   Special Requests RIGHT KNEE  Final   Gram Stain    Final    MODERATE WBC PRESENT, PREDOMINANTLY PMN NO ORGANISMS SEEN    Culture   Final    NO GROWTH 3 DAYS Performed at [facility], [address], [place], [place] [address]    Report Status 06/12/2018 FINAL  Final  MRSA PCR Screening     Status: None   Collection Time: 06/14/18  1:10 AM  Result Value Ref Range  Status   MRSA by PCR NEGATIVE NEGATIVE Final    Comment:        The GeneXpert MRSA Assay (FDA approved for NASAL specimens only), is one component of a comprehensive MRSA colonization surveillance program. It is not intended to diagnose MRSA infection nor to guide or monitor treatment for MRSA infections. Performed at [facility], [address] [place], [place] [address]   Surgical PCR screen     Status: Abnormal   Collection Time: 06/14/18  4:40 AM  Result Value Ref Range Status   MRSA, PCR NEGATIVE NEGATIVE Final   Staphylococcus aureus POSITIVE (A) NEGATIVE Final    Comment: (NOTE) The Xpert SA Assay (FDA approved for NASAL specimens in patients 52 years of age and older), is one component of a comprehensive surveillance program. It is not intended to diagnose infection nor to guide or monitor treatment. Performed at [facility], [address], [place], [place] [address]       Radiology Studies: Dg Cervical Spine 2 Or 3 Views  Result Date: 06/14/2018 CLINICAL DATA:  Posterior cervical spine fusion EXAM: CERVICAL SPINE - 2-3 VIEW COMPARISON:  CT cervical spine of 06/07/2017 FINDINGS: Two C-arm spot films were returned. These images show hardware placed for posterior fusion at C2-C3 C4 and by history C5 as well. The C4-5 area is not well seen on the C-arm images obtained. IMPRESSION: Hardware placed for posterior fusion from C2 to to C5 although the C5 level is not well seen. Electronically Signed   By: Dwyane Dee M.D.   On: 06/14/2018 16:06   Dg C-arm 1-60 Min  Result Date: 06/14/2018 CLINICAL DATA:  Posterior fusion from C2-C5 EXAM:  DG C-ARM 61-120 MIN COMPARISON:  CT C-spine of 06/07/2017 FINDINGS: C-arm fluoroscopy was provided during posterior fusion from C2-C5 by history. Fluoroscopy time of 40 seconds was recorded. IMPRESSION: C-arm fluoroscopy provided. Electronically Signed   By: Dwyane Dee M.D.   On: 06/14/2018 16:07     Medications:  Scheduled: . amLODipine  2.5 mg Oral Daily  . dexamethasone  6 mg Intravenous Q6H  . febuxostat  40 mg Oral Daily  . gabapentin  300 mg Oral TID  . Influenza vac split quadrivalent PF  0.5 mL Intramuscular Tomorrow-1000  . methocarbamol  750 mg Oral QID  . mupirocin ointment  1 application Nasal BID  . pantoprazole  40 mg Oral BID  . polyethylene glycol  17 g Oral BID  . sodium chloride flush  3 mL Intravenous Q12H   Continuous: . sodium chloride    .  ceFAZolin (ANCEF) IV 2 g (06/15/18 0541)  . lactated ringers 10 mL/hr at 06/14/18 1217  . methocarbamol (ROBAXIN) IV 500 mg (06/10/18 1839)   UJW:JXBJYNWGNFAOZ **OR** acetaminophen, acetaminophen **OR** acetaminophen, alum & mag hydroxide-simeth, hydrALAZINE, HYDROmorphone (DILAUDID) injection, menthol-cetylpyridinium **OR** phenol, methocarbamol (ROBAXIN) IV, methocarbamol, morphine injection, ondansetron **OR** ondansetron (ZOFRAN) IV, ondansetron **OR** ondansetron (ZOFRAN) IV, oxyCODONE, oxyCODONE, sodium chloride flush    Assessment/Plan:  Polyarthralgia/persistent fever and elevated inflammatory markers Patient symptoms mainly involve shoulders ankles.  He also has a history of gout as a result of which he has pain in his knees.  Has chronic lower back pain.  Reason for his polyarthralgia is not entirely clear.  Patient was found to have joint effusion in both knees.  Arthrocentesis was performed without any evidence for infection.  See below as well.  Has not had any fevers in the last 48 hours.  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  Patient underwent surgery on 10/2.  Feels  better.  Further management per neurosurgery.  Patient noted to be on dexamethasone.  Pain medications and muscle relaxants.  Bilateral knee pain status post arthrocentesis Possible gout versus pseudogout.  Now improved.  Polymyalgia and polyarthralgia CK level 60.  CRP 18.  ANCA negative.  Aldolase negative.  CCP weakly positive.  ANA negative.  Discussed with the rheumatologist Dr. Deanne Coffer (cell phone--[phone]).  Will need outpatient follow-up with him.  Patient is currently on steroids.  This will be continued until the patient has had a chance to follow-up with the rheumatology.    Essential hypertension Pressure remains stable.  Continue antihypertensive medications.  Recent PE in the setting of spine surgery Patient status post IVC filter placement on July 16.  Patient was on apixaban.  Changed over to IV heparin.  Anticoagulation was discontinued for surgery.  To resume once okay with neurosurgery.  He does have an IVC filter in place.  History of gout On Uloric  Normocytic anemia No overt bleeding noted.  Hemoglobin remains stable.   DVT Prophylaxis: SCDs. Code Status: Full code Family Communication: Discussed with the patient Disposition Plan: Management as outlined above.  Mobilize as tolerated and if okay with neurosurgery.      LOS: 8 days   Osvaldo Shipper  [facility] Pager [phone] 06/15/2018, 10:46 AM  If 7PM-7AM, please contact night-coverage at [email or web address], password [facility]

## 2018-06-16 DIAGNOSIS — M4712 Other spondylosis with myelopathy, cervical region: Principal | ICD-10-CM

## 2018-06-16 DIAGNOSIS — M23206 Derangement of unspecified meniscus due to old tear or injury, right knee: Secondary | ICD-10-CM

## 2018-06-16 MED ORDER — DEXAMETHASONE SODIUM PHOSPHATE 10 MG/ML IJ SOLN
4.0000 mg | Freq: Four times a day (QID) | INTRAMUSCULAR | Status: DC
  Administered 2018-06-16 – 2018-06-21 (×20): 4 mg via INTRAVENOUS
  Filled 2018-06-16 (×19): qty 1

## 2018-06-16 MED ORDER — FUROSEMIDE 10 MG/ML IJ SOLN
20.0000 mg | Freq: Once | INTRAMUSCULAR | Status: AC
  Administered 2018-06-16: 20 mg via INTRAVENOUS
  Filled 2018-06-16: qty 2

## 2018-06-16 NOTE — Progress Notes (Addendum)
Physical Therapy Treatment Patient Details Name: [name] [name] MRN: [id number] DOB: 1966/07/06 Today's Date: 06/16/2018    History of Present Illness  52 y.o. male with recent ACDF at C3 C5 by Dr. Wynetta Emery in July 2019 complicated by pulmonary embolism was placed on apixaban subsequently discharged to rehab and then SNF.  Pt underwent posterior cervical decompressive laminectomy, foraminotomies, and cervical fusion C3,C4, C5 06/14/18.  PMH including gout, HTN, and pulmonary embolism. Pt presenting post fall after his legs gave away after getting out of the car. MRI of cervical and lumbar spine showing no change or infection per Dr. Wynetta Emery. MR of right knee showing complex tear of the posterior horn of the medial meniscus. Right knee aspirated on 06/09/18.     PT Comments    Patient progressing slowly.  Still with orthostatic symptoms this session with BP sitting 133/77; after standing 105/60.  Also self limited due to extremely fearful of falling.  Continues to be appropriate for skilled PT in the acute setting and follow up CIR level rehab upon d/c.   Follow Up Recommendations  CIR     Equipment Recommendations  Other (comment)(tba)    Recommendations for Other Services       Precautions / Restrictions Precautions Precautions: Fall;Cervical Precaution Comments: reviewed cervical precautions  Required Braces or Orthoses: Cervical Brace Knee Immobilizer - Right: On when out of bed or walking Cervical Brace: Hard collar    Mobility  Bed Mobility Overal bed mobility: Needs Assistance Bed Mobility: Rolling;Sidelying to Sit;Sit to Supine Rolling: Mod assist Sidelying to sit: Max assist   Sit to supine: Mod assist   General bed mobility comments: cues for technique, assist for legs off bed and to lift trunk; to supine assist for both legs and positioning trunk.  Transfers Overall transfer level: Needs assistance Equipment used: Ambulation equipment used Transfers: Sit to/from  Stand Sit to Stand: Mod assist;From elevated surface;+2 safety/equipment         General transfer comment: stood to stedy for safety, lateral weight shifts for balance. R knee activation in stance and for sensory input; sat on stedy due to symptomatic, then returned to bed  Ambulation/Gait             General Gait Details: unable, orthostatic   Stairs             Wheelchair Mobility    Modified Rankin (Stroke Patients Only)       Balance     Sitting balance-Leahy Scale: Fair     Standing balance support: Bilateral upper extremity supported Standing balance-Leahy Scale: Poor Standing balance comment: needs UE support for balance                            Cognition Arousal/Alertness: Awake/alert Behavior During Therapy: Anxious Overall Cognitive Status: Within Functional Limits for tasks assessed                                        Exercises General Exercises - Lower Extremity Quad Sets: AROM;5 reps;Both;Supine Short Arc Quad: AROM;Both;Supine;5 reps    General Comments General comments (skin integrity, edema, etc.): BP supine 133/65; sitting 133/77; after standing 105/60; back in supine 106/77      Pertinent Vitals/Pain Faces Pain Scale: Hurts even more Pain Location: neck  Pain Descriptors / Indicators: Operative site guarding;Shooting Pain Intervention(s): Monitored during session;Repositioned  Home Living                      Prior Function            PT Goals (current goals can now be found in the care plan section) Progress towards PT goals: Progressing toward goals(slowly)    Frequency    Min 5X/week      PT Plan Current plan remains appropriate;Frequency needs to be updated    Co-evaluation              AM-PAC PT "6 Clicks" Daily Activity  Outcome Measure  Difficulty turning over in bed (including adjusting bedclothes, sheets and blankets)?: Unable Difficulty moving from  lying on back to sitting on the side of the bed? : Unable Difficulty sitting down on and standing up from a chair with arms (e.g., wheelchair, bedside commode, etc,.)?: Unable Help needed moving to and from a bed to chair (including a wheelchair)?: A Lot Help needed walking in hospital room?: Total Help needed climbing 3-5 steps with a railing? : Total 6 Click Score: 7    End of Session Equipment Utilized During Treatment: Gait belt;Right knee immobilizer;Cervical collar Activity Tolerance: Patient tolerated treatment well;Patient limited by pain;Patient limited by fatigue Patient left: in bed;with call bell/phone within reach   PT Visit Diagnosis: Other abnormalities of gait and mobility (R26.89);Other symptoms and signs involving the nervous system (R29.898);Muscle weakness (generalized) (M62.81)     Time: 8469-6295 PT Time Calculation (min) (ACUTE ONLY): 27 min  Charges:  $Therapeutic Activity: 23-37 mins                     Sheran Lawless, [facility] [phone] 06/16/2018    [name] [name] 06/16/2018, 11:54 AM

## 2018-06-16 NOTE — Progress Notes (Signed)
[facility] PROGRESS NOTE  [name] [name] ZOX:[id number] DOB: 1966-07-27 DOA: 06/06/2018  PCP: Devra Dopp, MD  Brief History/Interval Summary: 52 year old African-American male who underwent cervical spine surgery in July complicated by DVT and PE.  IVC filter was placed followed by hematology oncology.  Had an extended stay in rehabilitation.  Has been on anticoagulation.  Presented to an outside facility with increasing weakness possible pneumonia.  Was transferred here to [facility].  Has been having polyarthralgias.  Seen by neurosurgery.  Underwent posterior cervical decompression.  Consultants: Neurosurgery.  Phone discussion with rheumatology.  Procedures:  On 10/2 1. posterior cervical decompressive laminectomy at C3, C4, C5 with removal of the spinous process and complete laminectomy and foraminotomies of the C3, C4, C5 nerve roots. 2.  Posterior cervical fusion with lateral mass screws at C3-C4-C5 and pars screws at C2 lies in the globus ellipse Lateral mass screw system 3.  Posterior lateral arthrodesis C2-C5 utilizing locally harvested autograft mixed with vivigen  Antibiotics: None  Subjective/Interval History: Patient overall feels better than how he has been especially in terms of upper extremity weakness and pain.  He is concerned about swelling in both his legs.  He is anxious about requiring rehab and worried if insurance will authorize this.     ROS: Denies any chest pain or shortness of breath.  No nausea or vomiting  Objective:  Vital Signs  Vitals:   06/15/18 2343 06/15/18 2355 06/16/18 0300 06/16/18 0723  BP:  121/85 118/85 117/89  Pulse:  94 95 85  Resp:      Temp: 98.3 F (36.8 C)  98 F (36.7 C) 98.4 F (36.9 C)  TempSrc: Oral  Oral Oral  SpO2:  97% 99% 99%  Weight:      Height:        Intake/Output Summary (Last 24 hours) at 06/16/2018 0842 Last data filed at 06/16/2018 0728 Gross per 24 hour  Intake 423.23 ml  Output 861 ml  Net  -437.77 ml   Filed Weights   06/06/18 2246 06/14/18 1151  Weight: 123.8 kg 123.8 kg    General appearance: Awake alert.  In no distress Resp: Normal effort at rest.  Clear to auscultation bilaterally. Cardio: S1-S2 is normal regular.  No S3-S4.  No rubs murmurs or bruit GI: Abdomen is soft.  Nontender nondistended.  Bowel sounds are present normal.  No masses organomegaly Extremities: Patient does have some edema in both his lower legs and feet. Neurological: Alert and oriented x3.  Improving strength in upper and lower extremities.  Lab Results:  Data Reviewed: I have personally reviewed following labs and imaging studies  CBC: Recent Labs  Lab 06/11/18 0101 06/12/18 0119 06/13/18 0500 06/14/18 0559 06/15/18 0324  WBC 16.6* 13.6* 14.2* 10.8* 15.8*  HGB 8.6* 8.9* 9.1* 8.9* 9.3*  HCT 27.9* 29.4* 30.3* 28.6* 30.1*  MCV 89.7 91.3 92.7 91.1 92.6  PLT 344 375 387 315 241    Basic Metabolic Panel: Recent Labs  Lab 06/12/18 0119 06/13/18 0500 06/15/18 0324  NA 139 139 136  K 3.9 4.1 4.5  CL 106 107 102  CO2 24 25 23   GLUCOSE 198* 131* 181*  BUN 21* 19 15  CREATININE 0.97 1.00 1.03  CALCIUM 8.9 9.0 9.2    GFR: Estimated Creatinine Clearance: 109.1 mL/min (by C-G formula based on SCr of 1.03 mg/dL).  Liver Function Tests: Recent Labs  Lab 06/12/18 0119 06/13/18 0500  AST 32 55*  ALT 27 47*  ALKPHOS 77 71  BILITOT 0.4 0.3  PROT 6.1* 6.3*  ALBUMIN 2.6* 2.5*    Cardiac Enzymes: Recent Labs  Lab 06/09/18 1409  CKTOTAL 60     Recent Results (from the past 240 hour(s))  Culture, blood (routine x 2)     Status: None   Collection Time: 06/07/18 12:23 AM  Result Value Ref Range Status   Specimen Description BLOOD RIGHT ANTECUBITAL  Final   Special Requests   Final    BOTTLES DRAWN AEROBIC ONLY Blood Culture results may not be optimal due to an inadequate volume of blood received in culture bottles   Culture   Final    NO GROWTH 5 DAYS Performed at [facility], [address], [place], [place] [address]    Report Status 06/12/2018 FINAL  Final  Culture, blood (routine x 2)     Status: None   Collection Time: 06/07/18 12:33 AM  Result Value Ref Range Status   Specimen Description BLOOD RIGHT HAND  Final   Special Requests   Final    BOTTLES DRAWN AEROBIC ONLY Blood Culture results may not be optimal due to an inadequate volume of blood received in culture bottles   Culture   Final    NO GROWTH 5 DAYS Performed at [facility], [address] [place], [place] [address]    Report Status 06/12/2018 FINAL  Final  Culture, Urine     Status: Abnormal   Collection Time: 06/07/18  8:34 AM  Result Value Ref Range Status   Specimen Description URINE, CATHETERIZED  Final   Special Requests NONE  Final   Culture (A)  Final    <10,000 COLONIES/mL INSIGNIFICANT GROWTH Performed at [facility], [address] [place], [place] [address]    Report Status 06/08/2018 FINAL  Final  Culture, blood (routine x 2)     Status: None   Collection Time: 06/08/18  3:03 PM  Result Value Ref Range Status   Specimen Description BLOOD RIGHT ANTECUBITAL  Final   Special Requests   Final    BOTTLES DRAWN AEROBIC AND ANAEROBIC Blood Culture results may not be optimal due to an excessive volume of blood received in culture bottles   Culture   Final    NO GROWTH 5 DAYS Performed at [facility], [address], [place], [place] [address]    Report Status 06/13/2018 FINAL  Final  Culture, blood (routine x 2)     Status: None   Collection Time: 06/08/18  3:03 PM  Result Value Ref Range Status   Specimen Description BLOOD RIGHT HAND  Final   Special Requests   Final    BOTTLES DRAWN AEROBIC AND ANAEROBIC Blood Culture results may not be optimal due to an excessive volume of blood received in culture bottles   Culture   Final    NO GROWTH 5 DAYS Performed at [facility], [address] [place], [place] [address]    Report Status  06/13/2018 FINAL  Final  Body fluid culture     Status: None   Collection Time: 06/09/18  2:12 PM  Result Value Ref Range Status   Specimen Description SYNOVIAL  Final   Special Requests RIGHT KNEE  Final   Gram Stain   Final    MODERATE WBC PRESENT, PREDOMINANTLY PMN NO ORGANISMS SEEN    Culture   Final    NO GROWTH 3 DAYS Performed at [facility], [address]  [place], [place] [address]    Report Status 06/12/2018 FINAL  Final  MRSA PCR Screening     Status: None   Collection Time: 06/14/18  1:10 AM  Result Value Ref Range Status   MRSA by PCR NEGATIVE NEGATIVE Final    Comment:        The GeneXpert MRSA Assay (FDA approved for NASAL specimens only), is one component of a comprehensive MRSA colonization surveillance program. It is not intended to diagnose MRSA infection nor to guide or monitor treatment for MRSA infections. Performed at [facility], [address], [place], [place] [address]   Surgical PCR screen     Status: Abnormal   Collection Time: 06/14/18  4:40 AM  Result Value Ref Range Status   MRSA, PCR NEGATIVE NEGATIVE Final   Staphylococcus aureus POSITIVE (A) NEGATIVE Final    Comment: (NOTE) The Xpert SA Assay (FDA approved for NASAL specimens in patients 53 years of age and older), is one component of a comprehensive surveillance program. It is not intended to diagnose infection nor to guide or monitor treatment. Performed at [facility], [address] [place], [place] [address]       Radiology Studies: Dg Cervical Spine 2 Or 3 Views  Result Date: 06/14/2018 CLINICAL DATA:  Posterior cervical spine fusion EXAM: CERVICAL SPINE - 2-3 VIEW COMPARISON:  CT cervical spine of 06/07/2017 FINDINGS: Two C-arm spot films were returned. These images show hardware placed for posterior fusion at C2-C3 C4 and by history C5 as well. The C4-5 area is not well seen on the C-arm images obtained. IMPRESSION: Hardware placed for posterior  fusion from C2 to to C5 although the C5 level is not well seen. Electronically Signed   By: Dwyane Dee M.D.   On: 06/14/2018 16:06   Dg C-arm 1-60 Min  Result Date: 06/14/2018 CLINICAL DATA:  Posterior fusion from C2-C5 EXAM: DG C-ARM 61-120 MIN COMPARISON:  CT C-spine of 06/07/2017 FINDINGS: C-arm fluoroscopy was provided during posterior fusion from C2-C5 by history. Fluoroscopy time of 40 seconds was recorded. IMPRESSION: C-arm fluoroscopy provided. Electronically Signed   By: Dwyane Dee M.D.   On: 06/14/2018 16:07     Medications:  Scheduled: . dexamethasone  6 mg Intravenous Q6H  . febuxostat  40 mg Oral Daily  . furosemide  20 mg Intravenous Once  . gabapentin  300 mg Oral TID  . Influenza vac split quadrivalent PF  0.5 mL Intramuscular Tomorrow-1000  . methocarbamol  750 mg Oral QID  . mupirocin ointment  1 application Nasal BID  . pantoprazole  40 mg Oral BID  . polyethylene glycol  17 g Oral BID  . sodium chloride flush  3 mL Intravenous Q12H   Continuous: . sodium chloride    .  ceFAZolin (ANCEF) IV 2 g (06/16/18 0416)  . lactated ringers 10 mL/hr at 06/14/18 1217  . methocarbamol (ROBAXIN) IV 500 mg (06/10/18 1839)   OZH:YQMVHQIONGEXB **OR** acetaminophen, acetaminophen **OR** acetaminophen, alum & mag hydroxide-simeth, hydrALAZINE, HYDROmorphone (DILAUDID) injection, menthol-cetylpyridinium **OR** phenol, methocarbamol (ROBAXIN) IV, methocarbamol, morphine injection, ondansetron **OR** ondansetron (ZOFRAN) IV, ondansetron **OR** ondansetron (ZOFRAN) IV, oxyCODONE, oxyCODONE, sodium chloride flush    Assessment/Plan:  Polyarthralgia/persistent fever and elevated inflammatory markers Patient symptoms mainly involve shoulders ankles.  He also has a history of gout as a result of which he has pain in his knees.  Has chronic lower back pain.  Reason for his polyarthralgia is not entirely clear.  Patient was found  to have joint effusion in both knees.  Arthrocentesis was  performed without any evidence for infection.  See below as well.  Has not had any fevers in the last 48 hours.  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  Patient underwent surgery on 10/2.  Patient continues to improve.  Patient started on dexamethasone by neurosurgery.  Defer to them.  Pain is better controlled.    Lower extremity edema Apparently has been there since his previous surgery but worse in the last few days.  Patient recently started on amlodipine which caused lower extremity edema.  We will discontinue this.  We will give him 1 dose of IV Lasix.  Consider alternative antihypertensive agent depending on blood pressures.  Bilateral knee pain status post arthrocentesis Possible gout versus pseudogout.  Now improved.  Polymyalgia and polyarthralgia CK level 60.  CRP 18.  ANCA negative.  Aldolase negative.  CCP weakly positive.  ANA negative.  Discussed with rheumatologist Dr. Deanne Coffer (cell phone--[phone]).  Will need outpatient follow-up with him.  Patient was on prednisone however switched over to dexamethasone by neurosurgery.  Steroids should be continued until the patient has had a chance to follow-up with the rheumatology.    Essential hypertension Patient concerned about lower extremity edema.  Noted to be on amlodipine which will be discontinued.  Blood pressures have been stable.  High readings probably due to pain issues.  Consider alternative agents such as ACE inhibitor if her blood pressure continues to be elevated despite adequate pain control.  Recent PE in the setting of spine surgery Patient status post IVC filter placement on July 16.  Patient was on apixaban.  Changed over to IV heparin.  Anticoagulation was discontinued for surgery.  To resume once okay with neurosurgery.  He does have an IVC filter in place.  Discussed with Dr. Wynetta Emery today.  He would like to hold off anticoagulation for at least 7 days postop.  This is because  during his previous hospitalization when he was placed on anticoagulation he did experience some bleeding at the surgical site so he remains at high risk for same.  History of gout On [place]  Normocytic anemia No overt bleeding noted.  Hemoglobin remains stable.   DVT Prophylaxis: SCDs. Code Status: Full code Family Communication: Discussed with the patient. Disposition Plan: Management as outlined above.  Patient being seen by PT and OT.  Rehab consult placed by neurosurgery.    LOS: 9 days   Osvaldo Shipper  [facility] Pager [phone] 06/16/2018, 8:42 AM  If 7PM-7AM, please contact night-coverage at [email or web address], password [facility]

## 2018-06-16 NOTE — Progress Notes (Signed)
Patient has orders to transfer-going to [id number].  A.D cancelled transfer of patient.

## 2018-06-16 NOTE — Consult Note (Signed)
Physical Medicine and Rehabilitation Consult Reason for Consult: Decreased functional mobility Referring Physician: [facility]   HPI: [name] [name] is a 52 y.o. right-handed male with history of hypertension, pernicious anemia and polyarthralgia/gout, right knee complex tear posterior horn maintained in Bledsoe brace.  Patient lives with parents and assistance as needed.  Presented 06/07/2018 with recent ACDF at C3-C5 by Dr. Wynetta Emery in July 2019 complicated by pulmonary emboli placed on Eliquis and received inpatient rehab services 04/11/2018 until 05/02/2018 discharge to home ambulating greater than 300 feet using a rolling walker and supervision.  Presented with increasing lower extremity weakness as well as low-grade fever.  Noted joint effusion in both knees arthrocentesis performed without any evidence of infection.  CK level of 60.  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  Patient underwent posterior cervical decompressive laminectomy C3-C5 with removal of spinous process and complete laminectomy and foraminotomies.  Posterior cervical fusion and lateral mass screws 06/14/2018 per Dr. Wynetta Emery.  [facility] course pain management.  Cervical collar as directed.  Therapy evaluations completed with recommendations of physical medicine rehab consult.   Review of Systems  Constitutional: Negative for fever.  HENT: Negative for hearing loss.   Eyes: Negative for blurred vision and double vision.  Respiratory: Negative for cough and shortness of breath.   Cardiovascular: Positive for leg swelling. Negative for chest pain and palpitations.  Gastrointestinal: Positive for constipation. Negative for nausea and vomiting.  Genitourinary: Negative for dysuria, flank pain and hematuria.  Musculoskeletal: Positive for joint pain and myalgias.  Skin: Negative for rash.  All other systems reviewed and are negative.  Past Medical History:  Diagnosis Date  .  Degenerative disc disease, lumbar   . Gout   . History of pernicious anemia 1980   Patient says he takes OTC iron supplements QD  . Hypertension    Past Surgical History:  Procedure Laterality Date  . ANTERIOR CERVICAL CORPECTOMY N/A 03/24/2018   Procedure: CERVICAL FOUR ANTERIOR CERVICAL CORPECTOMY;  Surgeon: Donalee Citrin, MD;  Location: [facility] OR;  Service: Neurosurgery;  Laterality: N/A;  . ANTERIOR CERVICAL DECOMP/DISCECTOMY FUSION N/A 03/28/2018   Procedure: Reexploration of anterior cervical wound;  Surgeon: Donalee Citrin, MD;  Location: [facility] OR;  Service: Neurosurgery;  Laterality: N/A;  . IR IVC FILTER PLMT / S&I Lenise Arena GUID/MOD SED  03/28/2018  . IR RADIOLOGIST EVAL & MGMT  05/31/2018  . MENISCUS REPAIR Left   . POSTERIOR CERVICAL FUSION/FORAMINOTOMY N/A 06/14/2018   Procedure: POSTERIOR CERVICAL DECOMPRESSION/FUSION CERVICAL TWO- CERVICAL THREE CERVICAL THREE- CERVICAL FOUR, CERVICAL FOUR- CERVICAL FIVE;  Surgeon: Donalee Citrin, MD;  Location: [facility] OR;  Service: Neurosurgery;  Laterality: N/A;  POSTERIOR CERVICAL DECOMPRESSION/FUSION CERVICAL TWO- CERVICAL THREE CERVICAL THREE- CERVICAL FOUR, CERVICAL FOUR- CERVICAL FIVE  . TENDON REPAIR     tricept tendon   Family History  Family history unknown: Yes   Social History:  reports that he has never smoked. He has never used smokeless tobacco. He reports that he does not drink alcohol or use drugs. Allergies:  Allergies  Allergen Reactions  . Nsaids Other (See Comments)    G6PD DEFICIENCY  . Sulfa Antibiotics Other (See Comments)    G6PD DEFICIENCY  . Protonix [Pantoprazole Sodium] Diarrhea    Severe diarrhea, Upset stomach   Medications Prior to Admission  Medication Sig Dispense Refill  . acetaminophen (TYLENOL) 325 MG tablet Take 2 tablets (650 mg total) by mouth every 4 (four) hours as needed for mild pain ((score  1 to 3) or temp > 100.5).    Marland Kitchen apixaban (ELIQUIS) 5 MG TABS tablet Take 1 tablet (5 mg total) by mouth 2 (two) times daily. 60  tablet 1  . aspirin EC 81 MG EC tablet Take 1 tablet (81 mg total) by mouth daily.    . baclofen (LIORESAL) 10 MG tablet Take 1 tablet (10 mg total) by mouth 2 (two) times daily as needed for muscle spasms. 30 each 0  . colchicine 0.6 MG tablet Take 1 tablet (0.6 mg total) by mouth daily. 30 tablet 0  . Cyanocobalamin 5000 MCG/ML LIQD Place 5,000 mcg under the tongue daily. (Patient taking differently: Place 5,000 mcg under the tongue as needed. ) 1 Bottle 0  . hydrochlorothiazide (MICROZIDE) 12.5 MG capsule Take 1 capsule (12.5 mg total) by mouth daily. 30 capsule 1  . oxyCODONE 10 MG TABS Take 1 tablet (10 mg total) by mouth every 3 (three) hours as needed for severe pain ((score 7 to 10)). 30 tablet 0  . saccharomyces boulardii (FLORASTOR) 250 MG capsule Take 1 capsule (250 mg total) by mouth 2 (two) times daily. (Patient not taking: Reported on 06/08/2018) 60 capsule 0    Home: Home Living Family/patient expects to be discharged to:: Inpatient rehab Living Arrangements: Parent Additional Comments: Pt was previously at [facility] then SNF   Functional History: Prior Function Level of Independence: Needs assistance Gait / Transfers Assistance Needed: Since rehab and accident, has been using RW and knee brace ADL's / Homemaking Assistance Needed: Using AE for sock/shoe management. Performs ADLs independently. Family performs IADLs. 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  Functional Status:  Mobility: Bed Mobility Overal bed mobility: Needs Assistance Bed Mobility: Rolling, Sidelying to Sit, Sit to Supine Rolling: Mod assist Sidelying to sit: Max assist Supine to sit: Min assist Sit to supine: Max assist, +2 for physical assistance General bed mobility comments: Pt needed assistance for advancing the RLE off of the bed and getting it back in bed.  HOB elevated approximately 45 degrees with supine to sit, but all the way flat for sit to supine.     Transfers Overall transfer level: Needs assistance Equipment used: Ambulation equipment used Transfer via Lift Equipment: Stedy Transfers: Sit to/from Stand, Pharmacologist Sit to Stand: Mod assist, +2 safety/equipment, From elevated surface Stand pivot transfers: Min assist General transfer comment: unable to attempt transfer due to symptomatic orthostasis and need to return to supine.  Pt very nervous about moving.  He required assist to move into standing in [place]  Ambulation/Gait Ambulation/Gait assistance: Min Chemical engineer (Feet): 2 Feet Assistive device: Rolling walker (2 wheeled) Gait Pattern/deviations: Step-to pattern, Decreased stride length General Gait Details: not appropriate yet Gait velocity: decreased Gait velocity interpretation: <1.31 ft/sec, indicative of household ambulator    ADL: ADL Overall ADL's : Needs assistance/impaired Eating/Feeding: Set up, Supervision/ safety, Sitting Grooming: Set up, Supervision/safety, Bed level Upper Body Bathing: Moderate assistance, Bed level Lower Body Bathing: Maximal assistance, Bed level Lower Body Bathing Details (indicate cue type and reason): Pt requiring Max A to power up into standing and demonstrating good transfer techniques with weight shift and hand position.  Upper Body Dressing : Maximal assistance, Sitting Upper Body Dressing Details (indicate cue type and reason): Donning second gown like jacket while seated at EOB Lower Body Dressing: Total assistance, Sit to/from stand Lower Body Dressing Details (indicate cue type and reason): Max A to don socks and  knee brace. Max A for sit<>stand Toilet Transfer: Total assistance Toilet Transfer Details (indicate cue type and reason): unable to attempt at this time due to orthostatic hyposte Toileting- Clothing Manipulation and Hygiene: Total assistance, Bed level Functional mobility during ADLs: Total assistance General ADL Comments: Activity limited by  pain and orthostasis   Cognition: Cognition Overall Cognitive Status: Within Functional Limits for tasks assessed Orientation Level: Oriented X4 Cognition Arousal/Alertness: Awake/alert Behavior During Therapy: Anxious Overall Cognitive Status: Within Functional Limits for tasks assessed General Comments: Pt very nervious about knee buckling   Blood pressure 117/89, pulse 85, temperature 98.4 F (36.9 C), temperature source Oral, resp. rate 17, height 5' 9.02" (1.753 m), weight 123.8 kg, SpO2 99 %. Physical Exam  Constitutional: He is oriented to person, place, and time.  obese  HENT:  Head: Normocephalic.  Eyes: Pupils are equal, round, and reactive to light.  Neck:  Cervical collar in place  Cardiovascular: Normal rate.  Respiratory: Effort normal.  GI: Soft.  Musculoskeletal:  Generalized joint/muscle tenderness to palpation  Neurological: He is alert and oriented to person, place, and time.  UE 4/5. LE 2-3/5 prox to 4/5 distal. ?no obvious limitations with KE due to pain  Psychiatric:  Cooperative, irritable    No results found for this or any previous visit (from the past 24 hour(s)). Dg Cervical Spine 2 Or 3 Views  Result Date: 06/14/2018 CLINICAL DATA:  Posterior cervical spine fusion EXAM: CERVICAL SPINE - 2-3 VIEW COMPARISON:  CT cervical spine of 06/07/2017 FINDINGS: Two C-arm spot films were returned. These images show hardware placed for posterior fusion at C2-C3 C4 and by history C5 as well. The C4-5 area is not well seen on the C-arm images obtained. IMPRESSION: Hardware placed for posterior fusion from C2 to to C5 although the C5 level is not well seen. Electronically Signed   By: Dwyane Dee M.D.   On: 06/14/2018 16:06   Dg C-arm 1-60 Min  Result Date: 06/14/2018 CLINICAL DATA:  Posterior fusion from C2-C5 EXAM: DG C-ARM 61-120 MIN COMPARISON:  CT C-spine of 06/07/2017 FINDINGS: C-arm fluoroscopy was provided during posterior fusion from C2-C5 by history.  Fluoroscopy time of 40 seconds was recorded. IMPRESSION: C-arm fluoroscopy provided. Electronically Signed   By: Dwyane Dee M.D.   On: 06/14/2018 16:07     Assessment/Plan: Diagnosis: cervical spondylosis with myelopathy s/p C3-5 ACDF 7/19, now s/p PCDF C3-5 on 10/2. Presentation also complicated by right knee meniscus injury, PMR/polyarthralgias 1. Does the need for close, 24 hr/day medical supervision in concert with the patient's rehab needs make it unreasonable for this patient to be served in a less intensive setting? Yes 2. Co-Morbidities requiring supervision/potential complications: post-op sequelae, pain mgt 3. Due to bladder management, bowel management, safety, skin/wound care, disease management, medication administration, pain management and patient education, does the patient require 24 hr/day rehab nursing? Yes 4. Does the patient require coordinated care of a physician, rehab nurse, PT (1-2 hrs/day, 5 days/week) and OT (1-2 hrs/day, 5 days/week) to address physical and functional deficits in the context of the above medical diagnosis(es)? Yes Addressing deficits in the following areas: balance, endurance, locomotion, strength, transferring, bowel/bladder control, bathing, dressing, feeding, grooming, toileting and psychosocial support 5. Can the patient actively participate in an intensive therapy program of at least 3 hrs of therapy per day at least 5 days per week? Yes 6. The potential for patient to make measurable gains while on inpatient rehab is good 7. Anticipated functional outcomes upon discharge  from inpatient rehab are modified independent and supervision  with PT, modified independent and supervision with OT, n/a with SLP. 8. Estimated rehab length of stay to reach the above functional goals is: 12-18 days 9. Anticipated D/C setting: Home 10. Anticipated post D/C treatments: HH therapy and Outpatient therapy 11. Overall Rehab/Functional Prognosis:  good  RECOMMENDATIONS: This patient's condition is appropriate for continued rehabilitative care in the following setting: CIR Patient has agreed to participate in recommended program. Potentially Note that insurance prior authorization may be required for reimbursement for recommended care.  Comment: Rehab Admissions Coordinator to follow up.  Thanks,  Ranelle Oyster, MD, [place] [facility]  I have personally performed a face to face diagnostic evaluation of this patient. Additionally, I have reviewed and concur with the physician assistant's documentation above.    Mcarthur Rossetti Angiulli, PA-C 06/16/2018

## 2018-06-16 NOTE — Progress Notes (Signed)
Inpatient Rehabilitation-Admissions Coordinator    Met with patient at the bedside to discuss team's recommendation for inpatient rehabilitation. Shared booklets, expectations while in CIR, expected length of stay, and anticipated functional level at DC. Pt and his family agree for plan to return to CIR. Plan to follow for timing of medical readiness, insurance authorization, and IP Rehab bed availability.   AC will initiate insurance authorization process. Will follow up once decision has been made.   Jhonnie Garner, OTR/L  Rehab Admissions Coordinator  [phone] 06/16/2018 3:07 PM

## 2018-06-16 NOTE — Progress Notes (Signed)
Subjective: Patient reports doing well improved sensation and srength.  Objective: Vital signs in last 24 hours: Temp:  [97.4 F (36.3 C)-98.8 F (37.1 C)] 98.4 F (36.9 C) (10/04 0723) Pulse Rate:  [85-108] 85 (10/04 0723) BP: (117-149)/(85-97) 117/89 (10/04 0723) SpO2:  [97 %-100 %] 99 % (10/04 0723)  Intake/Output from previous day: 10/03 0701 - 10/04 0700 In: 423.2 [I.V.:223.2; IV Piggyback:200.1] Out: 661 [Urine:475; Drains:185; Stool:1] Intake/Output this shift: Total I/O In: -  Out: 200 [Urine:200]  strength 4+/5 incision C//D/I  Lab Results: Recent Labs    06/14/18 0559 06/15/18 0324  WBC 10.8* 15.8*  HGB 8.9* 9.3*  HCT 28.6* 30.1*  PLT 315 241   BMET Recent Labs    06/15/18 0324  NA 136  K 4.5  CL 102  CO2 23  GLUCOSE 181*  BUN 15  CREATININE 1.03  CALCIUM 9.2    Studies/Results: Dg Cervical Spine 2 Or 3 Views  Result Date: 06/14/2018 CLINICAL DATA:  Posterior cervical spine fusion EXAM: CERVICAL SPINE - 2-3 VIEW COMPARISON:  CT cervical spine of 06/07/2017 FINDINGS: Two C-arm spot films were returned. These images show hardware placed for posterior fusion at C2-C3 C4 and by history C5 as well. The C4-5 area is not well seen on the C-arm images obtained. IMPRESSION: Hardware placed for posterior fusion from C2 to to C5 although the C5 level is not well seen. Electronically Signed   By: Dwyane Dee M.D.   On: 06/14/2018 16:06   Dg C-arm 1-60 Min  Result Date: 06/14/2018 CLINICAL DATA:  Posterior fusion from C2-C5 EXAM: DG C-ARM 61-120 MIN COMPARISON:  CT C-spine of 06/07/2017 FINDINGS: C-arm fluoroscopy was provided during posterior fusion from C2-C5 by history. Fluoroscopy time of 40 seconds was recorded. IMPRESSION: C-arm fluoroscopy provided. Electronically Signed   By: Dwyane Dee M.D.   On: 06/14/2018 16:07    Assessment/Plan: POD 2 posterior decompression and fusion doing well making good progress.  continue mobilize with PT/OT.Marland Kitchenstabel for  rehab when bed becomes available  LOS: 9 days     Traci Plemons P 06/16/2018, 9:06 AM

## 2018-06-17 DIAGNOSIS — N179 Acute kidney failure, unspecified: Secondary | ICD-10-CM

## 2018-06-17 LAB — CBC WITH DIFFERENTIAL/PLATELET
BASOS PCT: 1 %
Basophils Absolute: 0.2 10*3/uL — ABNORMAL HIGH (ref 0.0–0.1)
EOS ABS: 0 10*3/uL (ref 0.0–0.7)
Eosinophils Relative: 0 %
HCT: 28.2 % — ABNORMAL LOW (ref 39.0–52.0)
HEMOGLOBIN: 8.5 g/dL — AB (ref 13.0–17.0)
LYMPHS PCT: 11 %
Lymphs Abs: 2.4 10*3/uL (ref 0.7–4.0)
MCH: 28.1 pg (ref 26.0–34.0)
MCHC: 30.1 g/dL (ref 30.0–36.0)
MCV: 93.1 fL (ref 78.0–100.0)
Monocytes Absolute: 1.6 10*3/uL — ABNORMAL HIGH (ref 0.1–1.0)
Monocytes Relative: 7 %
NEUTROS ABS: 18 10*3/uL — AB (ref 1.7–7.7)
Neutrophils Relative %: 81 %
Platelets: 180 10*3/uL (ref 150–400)
RBC: 3.03 MIL/uL — ABNORMAL LOW (ref 4.22–5.81)
RDW: 16.5 % — ABNORMAL HIGH (ref 11.5–15.5)
WBC: 22.2 10*3/uL — ABNORMAL HIGH (ref 4.0–10.5)

## 2018-06-17 LAB — BASIC METABOLIC PANEL
ANION GAP: 14 (ref 5–15)
BUN: 43 mg/dL — ABNORMAL HIGH (ref 6–20)
CALCIUM: 9.6 mg/dL (ref 8.9–10.3)
CO2: 24 mmol/L (ref 22–32)
Chloride: 94 mmol/L — ABNORMAL LOW (ref 98–111)
Creatinine, Ser: 1.48 mg/dL — ABNORMAL HIGH (ref 0.61–1.24)
GFR calc Af Amer: >60 mL/min (ref >60)
GFR calc non Af Amer: 53 mL/min — ABNORMAL LOW (ref >60)
GLUCOSE: 151 mg/dL — AB (ref 70–99)
Potassium: 4.6 mmol/L (ref 3.5–5.1)
Sodium: 132 mmol/L — ABNORMAL LOW (ref 135–145)

## 2018-06-17 MED ORDER — SODIUM CHLORIDE 0.9 % IV SOLN: INTRAVENOUS | Status: DC

## 2018-06-17 MED ORDER — SODIUM CHLORIDE 0.9 % IV SOLN
INTRAVENOUS | Status: DC
  Administered 2018-06-17 – 2018-06-19 (×3): via INTRAVENOUS
  Administered 2018-06-19: 1 mL via INTRAVENOUS

## 2018-06-17 MED ORDER — SODIUM CHLORIDE 0.9 % IV BOLUS: 500.0000 mL | Freq: Once | INTRAVENOUS | Status: DC

## 2018-06-17 MED ORDER — SODIUM CHLORIDE 0.9 % IV BOLUS
1000.0000 mL | Freq: Once | INTRAVENOUS | Status: AC
  Administered 2018-06-17: 1000 mL via INTRAVENOUS

## 2018-06-17 NOTE — Progress Notes (Addendum)
[facility] PROGRESS NOTE  [name] [name] WUJ:[id number] DOB: 07-Sep-1966 DOA: 06/06/2018  PCP: Devra Dopp, MD  Brief History/Interval Summary: 52 year old African-American male who underwent cervical spine surgery in July complicated by DVT and PE.  IVC filter was placed followed by hematology oncology.  Had an extended stay in rehabilitation.  Has been on anticoagulation.  Presented to an outside facility with increasing weakness possible pneumonia.  Was transferred here to [facility].  Has been having polyarthralgias.  Seen by neurosurgery.  Underwent posterior cervical decompression.  Consultants: Neurosurgery.  Phone discussion with rheumatology.  Procedures:  On 10/2 1. posterior cervical decompressive laminectomy at C3, C4, C5 with removal of the spinous process and complete laminectomy and foraminotomies of the C3, C4, C5 nerve roots. 2.  Posterior cervical fusion with lateral mass screws at C3-C4-C5 and pars screws at C2 lies in the globus ellipse Lateral mass screw system 3.  Posterior lateral arthrodesis C2-C5 utilizing locally harvested autograft mixed with vivigen  Antibiotics: None  Subjective/Interval History: Patient mentioned that he had another episode where he almost passed out when he was stood up.  This occurred yesterday.  Denied any chest pain shortness of breath.  No nausea vomiting.  Otherwise overall he feels well.     ROS: Denies any headaches.  Objective:  Vital Signs  Vitals:   06/17/18 0036 06/17/18 0438 06/17/18 0719 06/17/18 1144  BP: (!) 122/95 (!) 136/106 127/85 113/75  Pulse: 72 87 87 72  Resp: 16 18  15   Temp: 98 F (36.7 C) 98.8 F (37.1 C) 98 F (36.7 C) 97.8 F (36.6 C)  TempSrc: Oral Axillary Oral Oral  SpO2: 98% 100% 99% 99%  Weight:      Height:        Intake/Output Summary (Last 24 hours) at 06/17/2018 1303 Last data filed at 06/17/2018 1100 Gross per 24 hour  Intake 338.1 ml  Output 60 ml  Net 278.1 ml   Filed  Weights   06/06/18 2246 06/14/18 1151  Weight: 123.8 kg 123.8 kg    General appearance: Awake alert.  In no distress Resp: Normal effort.  Clear to auscultation bilaterally. Cardio: S1-S2 is normal regular.  No S3-S4.  No rubs murmurs or bruit GI: Abdomen soft.  Nontender nondistended.  Bowel sounds are present normal.  No masses organomegaly Extremities: Minimal edema bilateral lower extremities Neurological: Alert and oriented x3.  Improving strength in upper and lower extremities.  Lab Results:  Data Reviewed: I have personally reviewed following labs and imaging studies  CBC: Recent Labs  Lab 06/12/18 0119 06/13/18 0500 06/14/18 0559 06/15/18 0324 06/17/18 0753  WBC 13.6* 14.2* 10.8* 15.8* 22.2*  NEUTROABS  --   --   --   --  18.0*  HGB 8.9* 9.1* 8.9* 9.3* 8.5*  HCT 29.4* 30.3* 28.6* 30.1* 28.2*  MCV 91.3 92.7 91.1 92.6 93.1  PLT 375 387 315 241 180    Basic Metabolic Panel: Recent Labs  Lab 06/12/18 0119 06/13/18 0500 06/15/18 0324 06/17/18 0519  NA 139 139 136 132*  K 3.9 4.1 4.5 4.6  CL 106 107 102 94*  CO2 24 25 23 24   GLUCOSE 198* 131* 181* 151*  BUN 21* 19 15 43*  CREATININE 0.97 1.00 1.03 1.48*  CALCIUM 8.9 9.0 9.2 9.6    GFR: Estimated Creatinine Clearance: 75.9 mL/min (A) (by C-G formula based on SCr of 1.48 mg/dL (H)).  Liver Function Tests: Recent Labs  Lab 06/12/18 0119 06/13/18 0500  AST 32 55*  ALT 27 47*  ALKPHOS 77 71  BILITOT 0.4 0.3  PROT 6.1* 6.3*  ALBUMIN 2.6* 2.5*     Recent Results (from the past 240 hour(s))  Culture, blood (routine x 2)     Status: None   Collection Time: 06/08/18  3:03 PM  Result Value Ref Range Status   Specimen Description BLOOD RIGHT ANTECUBITAL  Final   Special Requests   Final    BOTTLES DRAWN AEROBIC AND ANAEROBIC Blood Culture results may not be optimal due to an excessive volume of blood received in culture bottles   Culture   Final    NO GROWTH 5 DAYS Performed at [facility],  [address] [place], [place] [address]    Report Status 06/13/2018 FINAL  Final  Culture, blood (routine x 2)     Status: None   Collection Time: 06/08/18  3:03 PM  Result Value Ref Range Status   Specimen Description BLOOD RIGHT HAND  Final   Special Requests   Final    BOTTLES DRAWN AEROBIC AND ANAEROBIC Blood Culture results may not be optimal due to an excessive volume of blood received in culture bottles   Culture   Final    NO GROWTH 5 DAYS Performed at [facility], [address], [place], [place] [address]    Report Status 06/13/2018 FINAL  Final  Body fluid culture     Status: None   Collection Time: 06/09/18  2:12 PM  Result Value Ref Range Status   Specimen Description SYNOVIAL  Final   Special Requests RIGHT KNEE  Final   Gram Stain   Final    MODERATE WBC PRESENT, PREDOMINANTLY PMN NO ORGANISMS SEEN    Culture   Final    NO GROWTH 3 DAYS Performed at [facility], [address] [place], [place] [address]    Report Status 06/12/2018 FINAL  Final  MRSA PCR Screening     Status: None   Collection Time: 06/14/18  1:10 AM  Result Value Ref Range Status   MRSA by PCR NEGATIVE NEGATIVE Final    Comment:        The GeneXpert MRSA Assay (FDA approved for NASAL specimens only), is one component of a comprehensive MRSA colonization surveillance program. It is not intended to diagnose MRSA infection nor to guide or monitor treatment for MRSA infections. Performed at [facility], [address], [place], [place] [address]   Surgical PCR screen     Status: Abnormal   Collection Time: 06/14/18  4:40 AM  Result Value Ref Range Status   MRSA, PCR NEGATIVE NEGATIVE Final   Staphylococcus aureus POSITIVE (A) NEGATIVE Final    Comment: (NOTE) The Xpert SA Assay (FDA approved for NASAL specimens in patients 6 years of age and older), is one component of a comprehensive surveillance program. It is not intended to diagnose infection nor to guide or  monitor treatment. Performed at [facility], [address], [place], [place] [address]       Radiology Studies: No results found.   Medications:  Scheduled: . dexamethasone  4 mg Intravenous Q6H  . febuxostat  40 mg Oral Daily  . gabapentin  300 mg Oral TID  . Influenza vac split quadrivalent PF  0.5 mL Intramuscular Tomorrow-1000  . methocarbamol  750 mg Oral QID  . pantoprazole  40 mg Oral BID  . polyethylene glycol  17 g Oral BID  . sodium chloride  flush  3 mL Intravenous Q12H   Continuous: . sodium chloride 250 mL (06/16/18 1626)  . sodium chloride 100 mL/hr at 06/17/18 0858  . methocarbamol (ROBAXIN) IV 500 mg (06/10/18 1839)   WUX:[id number] **OR** acetaminophen, acetaminophen **OR** acetaminophen, alum & mag hydroxide-simeth, hydrALAZINE, HYDROmorphone (DILAUDID) injection, menthol-cetylpyridinium **OR** phenol, methocarbamol (ROBAXIN) IV, methocarbamol, morphine injection, ondansetron **OR** ondansetron (ZOFRAN) IV, ondansetron **OR** ondansetron (ZOFRAN) IV, oxyCODONE, oxyCODONE, sodium chloride flush    Assessment/Plan:  Near syncope likely due to orthostatic hypotension Patient to have elevated BUN and creatinine today.  Patient was given Lasix x1 yesterday for lower extremity edema.  His oral intake has been poor than usual.  Patient will be given normal saline bolus and will be given maintenance IV fluids.  Recheck his labs tomorrow.  Place him on telemetry for at least 24 hours.  Mild acute renal failure Most likely due to hypovolemia.  Patient also received Lasix yesterday.  Give him IV fluids today.  Recheck labs tomorrow.  Monitor urine output.  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  Patient underwent surgery on 10/2.  Patient continues to improve.  Patient started on dexamethasone by neurosurgery.  Defer to them.  Pain is better controlled.    Polyarthralgia/persistent fever and elevated inflammatory  markers Patient symptoms mainly involved shoulders ankles.  He also has a history of gout as a result of which he has pain in his knees.  Has chronic lower back pain.  Reason for his polyarthralgia is not entirely clear.  Patient was found to have joint effusion in both knees.  Arthrocentesis was performed without any evidence for infection.  See below as well.  Has not had any fevers in the last 48 hours.  Lower extremity edema Apparently has been there since his previous surgery but worse in the last few days.  Patient recently started on amlodipine which caused lower extremity edema.  Amlodipine was discontinued.  He was given 1 dose of Lasix yesterday.  Hold off on further doses especially considering his rising BUN and creatinine.  Consider alternative antihypertensive agent depending on blood pressures.  Blood pressure is reasonably well controlled.  Bilateral knee pain status post arthrocentesis Possible gout versus pseudogout.  Now improved.  Polymyalgia and polyarthralgia CK level 60.  CRP 18.  ANCA negative.  Aldolase negative.  CCP weakly positive.  ANA negative.  Case was discussed by previous rounding physician with rheumatologist Dr. Deanne Coffer (cell phone--[phone]).  Will need outpatient follow-up with him.  Patient was on prednisone however switched over to dexamethasone by neurosurgery.  Steroids should be continued until the patient has had a chance to follow-up with the rheumatology.    Essential hypertension Patient concerned about lower extremity edema.  Noted to be on amlodipine which will be discontinued.  Blood pressures have been stable.  High readings probably due to pain issues.  Consider alternative agents such as ACE inhibitor if her blood pressure continues to be elevated despite adequate pain control.  Recent PE in the setting of spine surgery Patient status post IVC filter placement on July 16.  Patient was on apixaban.  Changed over to IV heparin.  Anticoagulation  was discontinued for surgery.  To resume once okay with neurosurgery.  He does have an IVC filter in place.  Discussed with Dr. Wynetta Emery.  He would like to hold off anticoagulation for at least 7 days postop.  This is because during his previous hospitalization when he was placed on anticoagulation he did experience  some bleeding at the surgical site so he remains at high risk for same.  So the earliest that the apixaban can be resumed will be 10/9.  This will need to occur in close consultation with Dr. Wynetta Emery.  History of gout On [place]  Normocytic anemia No overt bleeding noted.  Hemoglobin remains stable.  Elevated WBC is due to steroids.   DVT Prophylaxis: SCDs. Code Status: Full code Family Communication: Discussed with the patient. Disposition Plan: Management as outlined above.  IV fluids today.  Recheck labs tomorrow.  Inpatient rehabilitation is evaluating patient.      LOS: 10 days   Osvaldo Shipper  [facility] Pager [phone] 06/17/2018, 1:03 PM  If 7PM-7AM, please contact night-coverage at [email or web address], password [facility]

## 2018-06-17 NOTE — Progress Notes (Signed)
Physical Therapy Treatment Patient Details Name: [name] [name] MRN: [id number] DOB: 01-Dec-1965 Today's Date: 06/17/2018    History of Present Illness  52 y.o. male with recent ACDF at C3 C5 by Dr. Wynetta Emery in July 2019 complicated by pulmonary embolism was placed on apixaban subsequently discharged to rehab and then SNF.  Pt underwent posterior cervical decompressive laminectomy, foraminotomies, and cervical fusion C3,C4, C5 06/14/18.  PMH including gout, HTN, and pulmonary embolism. Pt presenting post fall after his legs gave away after getting out of the car. MRI of cervical and lumbar spine showing no change or infection per Dr. Wynetta Emery. MR of right knee showing complex tear of the posterior horn of the medial meniscus. Right knee aspirated on 06/09/18.     PT Comments    Pt is limited by drop in BP in standing only and unable to get true standing BP before transfer to the recliner chair to put his feet up.  He was able to get to the chair today, but we were unable to spend any time on his feet working on standing or weight shifting because his BP drops and he gets lightheaded and sweaty.  BPs as follows: 131/90 supine, 146/107 seated EOB, 122/80- not really standing as we had to sit down in the reclined chair and let it finish running because he became symptomatic/presyncopal.  He had knee high TEDs donned and SCDs actively running throughout the transitions.  I am wondering if we could get him fully upright (in the new vitalgo tilt bed several times a day if he would be able to tolerate standing for longer.  MD paged, awaiting a response.   Follow Up Recommendations  CIR     Equipment Recommendations  Wheelchair (measurements PT);Wheelchair cushion (measurements PT);Hospital bed    Recommendations for Other Services   NA     Precautions / Restrictions Precautions Precautions: Fall;Cervical Required Braces or Orthoses: Cervical Brace Knee Immobilizer - Right: On when out of bed or  walking Cervical Brace: Hard collar Other Brace/Splint: Bledsoe brace R leg locked into extension.     Mobility  Bed Mobility Overal bed mobility: Needs Assistance Bed Mobility: Rolling;Sidelying to Sit Rolling: Mod assist;+2 for physical assistance Sidelying to sit: Mod assist;+2 for physical assistance       General bed mobility comments: Two person heavy mod assist to roll to his right side and sit up from sidelying to EOB.  Assist also needed to scoot to EOB so that feet can touch.  R LE Bledsoe brace donned in bed prior to mobility.   Transfers Overall transfer level: Needs assistance   Transfers: Sit to/from Stand;Stand Pivot Transfers Sit to Stand: +2 physical assistance;Mod assist;From elevated surface Stand pivot transfers: +2 safety/equipment;From elevated surface(used the steady standing frame, pt seated)       General transfer comment: Stood with two person assist in steady standing frame with R LE locked into extension, assisted pt to sit on steady and turn to the recliner chair.  He was able to stand again and sit in the recliner as he was starting to feel "hot" and lightheaded.  BPs did drop in standing, but he was not able to stand long enough for the BP to completely stop running.   Ambulation/Gait             General Gait Details: Unable at this time as he is unable to tolerate standing >2 mins.        Balance Overall balance assessment: Needs assistance Sitting-balance support:  Feet supported;Bilateral upper extremity supported Sitting balance-Leahy Scale: Poor Sitting balance - Comments: Pt with tendancy towards posterior lean, unable to support himself EOB without feet supported and bil UE support.  Postural control: Posterior lean Standing balance support: Bilateral upper extremity supported Standing balance-Leahy Scale: Poor Standing balance comment: used steady standing frame and external assist.                              Cognition Arousal/Alertness: Awake/alert Behavior During Therapy: WFL for tasks assessed/performed Overall Cognitive Status: Within Functional Limits for tasks assessed                                 General Comments: Reasonably frustrated with his current state of affairs.  He is quite perseverative on explaining his entire course of treatment (very detailed).          General Comments General comments (skin integrity, edema, etc.): Pt is generally frustrated in lack of answers as to why his right leg will seemingly randomly buckle on him throughout his entire episode (since the first surgery, not just this surgery).  He likes to have a plan and I talked to him about our initial goal is to get his BP to be able to tolerate him up on his feet as we cannot get his LEs stronger with gait unless he can stand longer than 2 mins without a BP drop and getting lightheaded.  We talked about using a tilt bed to attempt this more frequently.        Pertinent Vitals/Pain Pain Assessment: Faces Faces Pain Scale: Hurts even more Pain Location: neck  Pain Descriptors / Indicators: Operative site guarding Pain Intervention(s): Limited activity within patient's tolerance;Monitored during session;Repositioned           PT Goals (current goals can now be found in the care plan section) Acute Rehab PT Goals Patient Stated Goal: to be able to walk again  Progress towards PT goals: Progressing toward goals    Frequency    Min 5X/week      PT Plan Current plan remains appropriate;Frequency needs to be updated       AM-PAC PT "6 Clicks" Daily Activity  Outcome Measure  Difficulty turning over in bed (including adjusting bedclothes, sheets and blankets)?: Unable Difficulty moving from lying on back to sitting on the side of the bed? : Unable Difficulty sitting down on and standing up from a chair with arms (e.g., wheelchair, bedside commode, etc,.)?: Unable Help needed moving  to and from a bed to chair (including a wheelchair)?: A Lot Help needed walking in hospital room?: Total Help needed climbing 3-5 steps with a railing? : Total 6 Click Score: 7    End of Session Equipment Utilized During Treatment: Gait belt;Cervical collar;Other (comment)(knee high TEDS, SCDs) Activity Tolerance: Other (comment)(limited by lightheadedness in standing. ) Patient left: in chair;with call bell/phone within reach Nurse Communication: Mobility status;Need for lift equipment;Other (comment)(use the sky lift for return to bed. ) PT Visit Diagnosis: Other abnormalities of gait and mobility (R26.89);Other symptoms and signs involving the nervous system (R29.898);Muscle weakness (generalized) (M62.81)     Time: 0981-1914 PT Time Calculation (min) (ACUTE ONLY): 52 min  Charges:  $Therapeutic Activity: 38-52 mins          Carey Johndrow B. Demetrick Eichenberger, PT, DPT  Acute Rehabilitation [phone] pager [phone]) [phone] office  06/17/2018, 4:01 PM

## 2018-06-17 NOTE — Progress Notes (Addendum)
Subjective: Patient reports Improve neurologically but another episode of presyncope yesterday when he stood up  Objective: Vital signs in last 24 hours: Temp:  [97.3 F (36.3 C)-98.8 F (37.1 C)] 98.8 F (37.1 C) (10/05 0438) Pulse Rate:  [72-90] 87 (10/05 0438) Resp:  [16-18] 18 (10/05 0438) BP: (117-136)/(80-106) 136/106 (10/05 0438) SpO2:  [97 %-100 %] 100 % (10/05 0438)  Intake/Output from previous day: 10/04 0701 - 10/05 0700 In: 817.6 [P.O.:600; I.V.:217.6] Out: 280 [Urine:250; Drains:30] Intake/Output this shift: No intake/output data recorded.  Strength improving 4+ out of 5 incision clean dry and intact  Lab Results: Recent Labs    06/15/18 0324  WBC 15.8*  HGB 9.3*  HCT 30.1*  PLT 241   BMET Recent Labs    06/15/18 0324 06/17/18 0519  NA 136 132*  K 4.5 4.6  CL 102 94*  CO2 23 24  GLUCOSE 181* 151*  BUN 15 43*  CREATININE 1.03 1.48*  CALCIUM 9.2 9.6    Studies/Results: No results found.  Assessment/Plan: Postop day 3 posterior cervical decompression fusion discontinue his Hemovac drain normal saline bolus check a CBC question of medicine whether he needs a dysrhythmia consultation.  BUN and creatinine slightly elevated possible this could represent relative dehydration.  LOS: 10 days     Coral Soler P 06/17/2018, 7:01 AM

## 2018-06-17 NOTE — Progress Notes (Signed)
Order for bolus of n.s 1000-Complete.  Patient repositioned in bed with legs propped on pillows because of the bilateral edema.   Patient more comfortable with hob >45  Degrees.   Hemovac d/c with no output and pads added anterior upper chest and posterior upper back for the aspen collar.

## 2018-06-18 LAB — BASIC METABOLIC PANEL
Anion gap: 13 (ref 5–15)
BUN: 54 mg/dL — AB (ref 6–20)
CHLORIDE: 95 mmol/L — AB (ref 98–111)
CO2: 24 mmol/L (ref 22–32)
Calcium: 9.3 mg/dL (ref 8.9–10.3)
Creatinine, Ser: 1.45 mg/dL — ABNORMAL HIGH (ref 0.61–1.24)
GFR calc Af Amer: >60 mL/min (ref >60)
GFR calc non Af Amer: 54 mL/min — ABNORMAL LOW (ref >60)
GLUCOSE: 160 mg/dL — AB (ref 70–99)
POTASSIUM: 4.5 mmol/L (ref 3.5–5.1)
Sodium: 132 mmol/L — ABNORMAL LOW (ref 135–145)

## 2018-06-18 LAB — CBC
HCT: 25.9 % — ABNORMAL LOW (ref 39.0–52.0)
HEMOGLOBIN: 7.9 g/dL — AB (ref 13.0–17.0)
MCH: 28.3 pg (ref 26.0–34.0)
MCHC: 30.5 g/dL (ref 30.0–36.0)
MCV: 92.8 fL (ref 78.0–100.0)
PLATELETS: 148 10*3/uL — AB (ref 150–400)
RBC: 2.79 MIL/uL — AB (ref 4.22–5.81)
RDW: 16.4 % — ABNORMAL HIGH (ref 11.5–15.5)
WBC: 19.8 10*3/uL — ABNORMAL HIGH (ref 4.0–10.5)

## 2018-06-18 MED ORDER — BENZONATATE 100 MG PO CAPS
100.0000 mg | ORAL_CAPSULE | Freq: Three times a day (TID) | ORAL | Status: DC | PRN
  Administered 2018-06-18 – 2018-07-26 (×15): 100 mg via ORAL
  Filled 2018-06-18 (×15): qty 1

## 2018-06-18 NOTE — Progress Notes (Signed)
[facility] PROGRESS NOTE  [name] [name] ZOX:[id number] DOB: July 23, 1966 DOA: 06/06/2018  PCP: Devra Dopp, MD  Brief History/Interval Summary: 52 year old African-American male who underwent cervical spine surgery in July complicated by DVT and PE.  IVC filter was placed followed by hematology oncology.  Had an extended stay in rehabilitation.  Has been on anticoagulation.  Presented to an outside facility with increasing weakness possible pneumonia.  Was transferred here to [facility].  Has been having polyarthralgias.  Seen by neurosurgery.  Underwent posterior cervical decompression.  Consultants: Neurosurgery.  Phone discussion with rheumatology.  Procedures:  On 10/2 1. posterior cervical decompressive laminectomy at C3, C4, C5 with removal of the spinous process and complete laminectomy and foraminotomies of the C3, C4, C5 nerve roots. 2.  Posterior cervical fusion with lateral mass screws at C3-C4-C5 and pars screws at C2 lies in the globus ellipse Lateral mass screw system 3.  Posterior lateral arthrodesis C2-C5 utilizing locally harvested autograft mixed with vivigen  Antibiotics: None  Subjective/Interval History: Patient again got quite lightheaded when he got up yesterday to work with physical therapy.  However later on in the day he felt better.  He was able to sit up on the chair.  Otherwise he feels quite well.  Denies any chest pain or shortness of breath.     ROS: Denies any headaches.  Objective:  Vital Signs  Vitals:   06/17/18 1514 06/17/18 1900 06/17/18 2300 06/18/18 0252  BP: (!) 127/91 133/86 (!) 133/92 111/89  Pulse: 83 77 71 72  Resp: 12 16 11    Temp: 98.2 F (36.8 C) 98 F (36.7 C) 97.8 F (36.6 C) 97.9 F (36.6 C)  TempSrc: Oral Oral Oral Oral  SpO2: 95% 95% 100% 100%  Weight:      Height:        Intake/Output Summary (Last 24 hours) at 06/18/2018 0909 Last data filed at 06/18/2018 0600 Gross per 24 hour  Intake 1563.33 ml    Output 1100 ml  Net 463.33 ml   Filed Weights   06/06/18 2246 06/14/18 1151  Weight: 123.8 kg 123.8 kg    General appearance: Awake alert.  In no distress Resp: Clear to auscultation bilaterally.  Normal effort Cardio: S1-S2 is normal regular.  No S3-S4. GI: Abdomen soft.  Nontender nondistended.  Bowel sounds are present normal.  No masses organomegaly. Extremities: Able edema in the lower extremities Neurological: Alert and oriented x3  Lab Results:  Data Reviewed: I have personally reviewed following labs and imaging studies  CBC: Recent Labs  Lab 06/13/18 0500 06/14/18 0559 06/15/18 0324 06/17/18 0753 06/18/18 0337  WBC 14.2* 10.8* 15.8* 22.2* 19.8*  NEUTROABS  --   --   --  18.0*  --   HGB 9.1* 8.9* 9.3* 8.5* 7.9*  HCT 30.3* 28.6* 30.1* 28.2* 25.9*  MCV 92.7 91.1 92.6 93.1 92.8  PLT 387 315 241 180 148*    Basic Metabolic Panel: Recent Labs  Lab 06/12/18 0119 06/13/18 0500 06/15/18 0324 06/17/18 0519 06/18/18 0337  NA 139 139 136 132* 132*  K 3.9 4.1 4.5 4.6 4.5  CL 106 107 102 94* 95*  CO2 24 25 23 24 24   GLUCOSE 198* 131* 181* 151* 160*  BUN 21* 19 15 43* 54*  CREATININE 0.97 1.00 1.03 1.48* 1.45*  CALCIUM 8.9 9.0 9.2 9.6 9.3    GFR: Estimated Creatinine Clearance: 77.5 mL/min (A) (by C-G formula based on SCr of 1.45 mg/dL (H)).  Liver Function Tests: Recent Labs  Lab 06/12/18 0119 06/13/18 0500  AST 32 55*  ALT 27 47*  ALKPHOS 77 71  BILITOT 0.4 0.3  PROT 6.1* 6.3*  ALBUMIN 2.6* 2.5*     Recent Results (from the past 240 hour(s))  Culture, blood (routine x 2)     Status: None   Collection Time: 06/08/18  3:03 PM  Result Value Ref Range Status   Specimen Description BLOOD RIGHT ANTECUBITAL  Final   Special Requests   Final    BOTTLES DRAWN AEROBIC AND ANAEROBIC Blood Culture results may not be optimal due to an excessive volume of blood received in culture bottles   Culture   Final    NO GROWTH 5 DAYS Performed at [facility], [address] [place], [place] [address]    Report Status 06/13/2018 FINAL  Final  Culture, blood (routine x 2)     Status: None   Collection Time: 06/08/18  3:03 PM  Result Value Ref Range Status   Specimen Description BLOOD RIGHT HAND  Final   Special Requests   Final    BOTTLES DRAWN AEROBIC AND ANAEROBIC Blood Culture results may not be optimal due to an excessive volume of blood received in culture bottles   Culture   Final    NO GROWTH 5 DAYS Performed at [facility], [address] [place], [place] [address]    Report Status 06/13/2018 FINAL  Final  Body fluid culture     Status: None   Collection Time: 06/09/18  2:12 PM  Result Value Ref Range Status   Specimen Description SYNOVIAL  Final   Special Requests RIGHT KNEE  Final   Gram Stain   Final    MODERATE WBC PRESENT, PREDOMINANTLY PMN NO ORGANISMS SEEN    Culture   Final    NO GROWTH 3 DAYS Performed at [facility], [address] [place], [place] [address]    Report Status 06/12/2018 FINAL  Final  MRSA PCR Screening     Status: None   Collection Time: 06/14/18  1:10 AM  Result Value Ref Range Status   MRSA by PCR NEGATIVE NEGATIVE Final    Comment:        The GeneXpert MRSA Assay (FDA approved for NASAL specimens only), is one component of a comprehensive MRSA colonization surveillance program. It is not intended to diagnose MRSA infection nor to guide or monitor treatment for MRSA infections. Performed at [facility], [address] [place], [place] [address]   Surgical PCR screen     Status: Abnormal   Collection Time: 06/14/18  4:40 AM  Result Value Ref Range Status   MRSA, PCR NEGATIVE NEGATIVE Final   Staphylococcus aureus POSITIVE (A) NEGATIVE Final    Comment: (NOTE) The Xpert SA Assay (FDA approved for NASAL specimens in patients 5 years of age and older), is one component of a comprehensive surveillance program. It is not intended to diagnose infection nor  to guide or monitor treatment. Performed at [facility], [address] [place], [place] [address]       Radiology Studies: No results found.   Medications:  Scheduled: . dexamethasone  4 mg Intravenous Q6H  . febuxostat  40 mg Oral Daily  . gabapentin  300 mg Oral TID  . Influenza vac split quadrivalent PF  0.5 mL Intramuscular Tomorrow-1000  . methocarbamol  750 mg Oral QID  . pantoprazole  40 mg Oral BID  . polyethylene  glycol  17 g Oral BID  . sodium chloride flush  3 mL Intravenous Q12H   Continuous: . sodium chloride 250 mL (06/16/18 1626)  . sodium chloride 100 mL/hr at 06/17/18 0858  . methocarbamol (ROBAXIN) IV 500 mg (06/10/18 1839)   ZOX:[id number] **OR** acetaminophen, acetaminophen **OR** acetaminophen, alum & mag hydroxide-simeth, hydrALAZINE, HYDROmorphone (DILAUDID) injection, menthol-cetylpyridinium **OR** phenol, methocarbamol (ROBAXIN) IV, methocarbamol, morphine injection, ondansetron **OR** ondansetron (ZOFRAN) IV, ondansetron **OR** ondansetron (ZOFRAN) IV, oxyCODONE, oxyCODONE, sodium chloride flush    Assessment/Plan:  Near syncope likely due to orthostatic hypotension Orthostatic hypotension likely a combination of hypovolemia as well as autonomic dysfunction.  He has TED stockings.  He was given IV fluid bolus yesterday.  Continue with IV fluid maintenance today as his BUN remains elevated.  Encourage oral intake.  No further diuretics.  Recheck labs tomorrow.  Get up slowly from a sitting and lying position.  Telemetry did not show any arrhythmias.    Mild acute renal failure Most likely due to hypovolemia and the fact that he received Lasix on 10/4.  Renal function is stable.  Continue IV fluids for today.  Recheck labs tomorrow.  Monitor urine output.    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  Patient underwent surgery on 10/2.  Patient appears to be stable from a neurosurgical standpoint.  He  remains on dexamethasone which was initiated by neurosurgery.  Will defer to them.    Polyarthralgia/persistent fever and elevated inflammatory markers Patient symptoms mainly involved shoulders ankles.  He also has a history of gout as a result of which he has pain in his knees.  Has chronic lower back pain.  Reason for his polyarthralgia is not entirely clear.  Patient was found to have joint effusion in both knees.  Arthrocentesis was performed without any evidence for infection.    Lower extremity edema Apparently has been there since his previous surgery but worse in the last few days.  Patient recently started on amlodipine which caused lower extremity edema.  Amlodipine was discontinued.  He was given 1 dose of Lasix.  Hold off on further doses especially considering his rising BUN and creatinine.   Bilateral knee pain status post arthrocentesis Possible gout versus pseudogout.  Now improved.  Polymyalgia and polyarthralgia CK level 60.  CRP 18.  ANCA negative.  Aldolase negative.  CCP weakly positive.  ANA negative.  Case was discussed by previous rounding physician with rheumatologist Dr. Deanne Coffer (cell phone--[phone]).  Will need outpatient follow-up with him.  Patient was on prednisone however switched over to dexamethasone by neurosurgery.  Steroids should be continued until the patient has had a chance to follow-up with the rheumatology.    Essential hypertension She was on amlodipine.  However he was noted to have lower extremity edema so the amlodipine was discontinued.  Blood pressure remained stable.  Hold off on starting any new antihypertensives considering his orthostatic hypotension.   Recent PE in the setting of spine surgery Patient status post IVC filter placement on July 16.  Patient was on apixaban.  Changed over to IV heparin.  Anticoagulation was discontinued for surgery.  To resume once okay with neurosurgery.  He does have an IVC filter in place.  Discussed with  Dr. Wynetta Emery.  He would like to hold off anticoagulation for at least 7 days postop.  This is because during his previous hospitalization when he was placed on anticoagulation he did experience some bleeding at the surgical site so he remains  at high risk for same.  So the earliest that the apixaban can be resumed will be 10/9.  This will need to occur in close consultation with Dr. Wynetta Emery.  History of gout On Uloric  Normocytic anemia No overt bleeding noted.  Hemoglobin noted to be fluctuating but stable for the most part.  Elevated WBC is likely due to steroids.   DVT Prophylaxis: SCDs. Code Status: Full code Family Communication: Discussed with the patient. Disposition Plan: Management as outlined above.  CIR recommended by physical and Occupational Therapy.  Continue IV fluids for another 24 hours and recheck labs tomorrow.       LOS: 11 days   [name] [name]  [facility] Pager [phone] 06/18/2018, 9:09 AM  If 7PM-7AM, please contact night-coverage at [email or web address], password [facility]

## 2018-06-18 NOTE — Progress Notes (Signed)
Subjective: Patient reports tingling improved in RUE. C/o orthostatic hypotension when working with therapies. States he would like to rest today and pursue therapies tomorrow,  Objective: Vital signs in last 24 hours: Temp:  [97.7 F (36.5 C)-98.2 F (36.8 C)] 97.7 F (36.5 C) (10/06 0900) Pulse Rate:  [71-83] 77 (10/06 0900) Resp:  [11-24] 24 (10/06 0900) BP: (110-133)/(75-92) 110/78 (10/06 0900) SpO2:  [95 %-100 %] 100 % (10/06 0900)  Intake/Output from previous day: 10/05 0701 - 10/06 0700 In: 1923.3 [P.O.:920; I.V.:1003.3] Out: 1100 [Urine:1100] Intake/Output this shift: No intake/output data recorded.  Strength 4+/5. 2+ pitting edema BLE. Scant amount of serosanguinous drainage on honeycomb dressing.  Lab Results: Recent Labs    06/17/18 0753 06/18/18 0337  WBC 22.2* 19.8*  HGB 8.5* 7.9*  HCT 28.2* 25.9*  PLT 180 148*   BMET Recent Labs    06/17/18 0519 06/18/18 0337  NA 132* 132*  K 4.6 4.5  CL 94* 95*  CO2 24 24  GLUCOSE 151* 160*  BUN 43* 54*  CREATININE 1.48* 1.45*  CALCIUM 9.6 9.3    Studies/Results: No results found.  Assessment/Plan: Postop day 4 posterior cervical decompression and fusion. Scant amount of sanguinous drainage on dressing. Fluid bolus given Friday for possible dehydration. Lasix also given for BLE edema. Mr/ Mortimer is being evaluated by inpatient rehab. No new recommendations.   LOS: 11 days     Floreen Comber 06/18/2018, 10:25 AM

## 2018-06-19 ENCOUNTER — Inpatient Hospital Stay (HOSPITAL_COMMUNITY)

## 2018-06-19 LAB — BASIC METABOLIC PANEL
ANION GAP: 11 (ref 5–15)
BUN: 42 mg/dL — AB (ref 6–20)
CHLORIDE: 97 mmol/L — AB (ref 98–111)
CO2: 24 mmol/L (ref 22–32)
Calcium: 9 mg/dL (ref 8.9–10.3)
Creatinine, Ser: 0.96 mg/dL (ref 0.61–1.24)
Glucose, Bld: 189 mg/dL — ABNORMAL HIGH (ref 70–99)
POTASSIUM: 4.7 mmol/L (ref 3.5–5.1)
SODIUM: 132 mmol/L — AB (ref 135–145)

## 2018-06-19 LAB — CBC
HEMATOCRIT: 24.4 % — AB (ref 39.0–52.0)
Hemoglobin: 7.4 g/dL — ABNORMAL LOW (ref 13.0–17.0)
MCH: 28.4 pg (ref 26.0–34.0)
MCHC: 30.3 g/dL (ref 30.0–36.0)
MCV: 93.5 fL (ref 78.0–100.0)
PLATELETS: 129 10*3/uL — AB (ref 150–400)
RBC: 2.61 MIL/uL — AB (ref 4.22–5.81)
RDW: 16.4 % — ABNORMAL HIGH (ref 11.5–15.5)
WBC: 21.8 10*3/uL — AB (ref 4.0–10.5)

## 2018-06-19 NOTE — Progress Notes (Signed)
Physical Therapy Treatment Patient Details Name: [name] [name] MRN: [id number] DOB: 1966-09-03 Today's Date: 06/19/2018    History of Present Illness  52 y.o. male with recent ACDF at C3 C5 by Dr. Wynetta [name] in July 2019 complicated by pulmonary embolism was placed on apixaban subsequently discharged to rehab and then SNF.  Pt underwent posterior cervical decompressive laminectomy, foraminotomies, and cervical fusion C3,C4, C5 06/14/18.  PMH including gout, HTN, and pulmonary embolism. Pt presenting post fall after his legs gave away after getting out of the car. MRI of cervical and lumbar spine showing no change or infection per Dr. Wynetta [name]. MR of right knee showing complex tear of the posterior horn of the medial meniscus. Right knee aspirated on 06/09/18.     PT Comments    Worked on initiating Vital go program.  2 trials completed at 5 min each to 45* and 50*.  2nd trial, taught [name], RN how to tilt the bed, register the angle of stand, record BP and general strap set up.    Follow Up Recommendations  CIR     Equipment Recommendations  Wheelchair (measurements PT);Wheelchair cushion (measurements PT);Hospital bed    Recommendations for Other Services       Precautions / Restrictions Precautions Precautions: Fall;Cervical Required Braces or Orthoses: Cervical Brace Knee Immobilizer - Right: On when out of bed or walking Cervical Brace: Hard collar Other Brace/Splint: Bledsoe brace R leg locked into extension.  Restrictions Weight Bearing Restrictions: No    Mobility  Bed Mobility Overal bed mobility: Needs Assistance Bed Mobility: Rolling;Sit to Sidelying Rolling: Mod assist;+2 for physical assistance;+2 for safety/equipment       Sit to sidelying: Max assist;+2 for physical assistance;+2 for safety/equipment General bed mobility comments: assist for trunk and LEs onto EOB when returning to sidelying  Transfers Overall transfer level: Needs assistance   Transfers: Sit  to/from Stand Sit to Stand: +2 physical assistance;Mod assist;From elevated surface         General transfer comment: VCs hand placement with use of bed pad to assist with raising hips from EOB; assist to boost into standing  Ambulation/Gait                 Stairs             Wheelchair Mobility    Modified Rankin (Stroke Patients Only)       Balance Overall balance assessment: Needs assistance Sitting-balance support: Feet supported;Bilateral upper extremity supported Sitting balance-Leahy Scale: Poor     Standing balance support: Bilateral upper extremity supported Standing balance-Leahy Scale: Poor Standing balance comment: used steady standing frame and external assist.                             Cognition Arousal/Alertness: Awake/alert Behavior During Therapy: WFL for tasks assessed/performed Overall Cognitive Status: Within Functional Limits for tasks assessed                                        Exercises      General Comments General comments (skin integrity, edema, etc.): Vital Go used to stand up to 45* first attempt for 5 min --BP 143/95  assymptomatic.  Initially L Knee extended, R knee slowly flexing.  When both were flexed, pt returned to supine.  Left to find RN to teach about the Vital Go.  [name] RN, intructed  in use of Vital Go remote and participated in another tilt. to 50* in increments.  Pt worked on maximal standing effort on/off for about 5 min.  BP 127/126 at 50*.  At no time did pt experience any diaphoresis or lightheadedness Pt started sinking against the straps, so pt returned to supine.      Pertinent Vitals/Pain Pain Assessment: Faces Faces Pain Scale: Hurts even more Pain Location: neck  Pain Descriptors / Indicators: Operative site guarding Pain Intervention(s): Monitored during session;Repositioned;Limited activity within patient's tolerance    Home Living                       Prior Function            PT Goals (current goals can now be found in the care plan section) Acute Rehab PT Goals Patient Stated Goal: to be able to walk again  PT Goal Formulation: With patient Time For Goal Achievement: 06/26/18 Potential to Achieve Goals: Good Progress towards PT goals: Progressing toward goals    Frequency    Min 5X/week      PT Plan Current plan remains appropriate;Frequency needs to be updated    Co-evaluation PT/OT/SLP Co-Evaluation/Treatment: Yes Reason for Co-Treatment: For patient/therapist safety;To address functional/ADL transfers PT goals addressed during session: Mobility/safety with mobility OT goals addressed during session: Strengthening/ROM;Proper use of Adaptive equipment and DME      AM-PAC PT "6 Clicks" Daily Activity  Outcome Measure  Difficulty turning over in bed (including adjusting bedclothes, sheets and blankets)?: Unable Difficulty moving from lying on back to sitting on the side of the bed? : Unable Difficulty sitting down on and standing up from a chair with arms (e.g., wheelchair, bedside commode, etc,.)?: Unable Help needed moving to and from a bed to chair (including a wheelchair)?: A Lot Help needed walking in hospital room?: Total Help needed climbing 3-5 steps with a railing? : Total 6 Click Score: 7    End of Session     Patient left: in bed;with call bell/phone within reach Nurse Communication: Mobility status;Need for lift equipment;Other (comment) PT Visit Diagnosis: Other abnormalities of gait and mobility (R26.89);Other symptoms and signs involving the nervous system (R29.898);Muscle weakness (generalized) (M62.81)     Time: 1533-1610 PT Time Calculation (min) (ACUTE ONLY): 37 min  Charges:  $Therapeutic Activity: 23-37 mins                     06/19/2018  Manchester Bing, PT Acute Rehabilitation Services [phone]  (pager) [phone]  (office)   [name] [name] 06/19/2018, 6:07 PM

## 2018-06-19 NOTE — Progress Notes (Signed)
[facility] PROGRESS NOTE  [name] [name] ZOX:[id number] DOB: December 23, 1965 DOA: 06/06/2018  PCP: Devra Dopp, MD  Brief History/Interval Summary: 52 year old African-American male who underwent cervical spine surgery in July complicated by DVT and PE.  IVC filter was placed followed by hematology oncology.  Had an extended stay in rehabilitation.  Has been on anticoagulation.  Presented to an outside facility with increasing weakness possible pneumonia.  Was transferred here to [facility].  Has been having polyarthralgias.  Seen by neurosurgery.  Underwent posterior cervical decompression.  Consultants: Neurosurgery.  Phone discussion with rheumatology.  Procedures:  On 10/2 1. posterior cervical decompressive laminectomy at C3, C4, C5 with removal of the spinous process and complete laminectomy and foraminotomies of the C3, C4, C5 nerve roots. 2.  Posterior cervical fusion with lateral mass screws at C3-C4-C5 and pars screws at C2 lies in the globus ellipse Lateral mass screw system 3.  Posterior lateral arthrodesis C2-C5 utilizing locally harvested autograft mixed with vivigen  Antibiotics: None  Subjective/Interval History: Patient did not get out of bed yesterday.  Did not get any symptoms of lightheadedness.  No chest pain or shortness of breath.  Continues to have a dry cough.  Otherwise he feels well.  Occasional neck pain.  ROS: Denies any headaches.  Objective:  Vital Signs  Vitals:   06/18/18 2053 06/18/18 2310 06/19/18 0800 06/19/18 0900  BP: 123/71 125/80 133/81   Pulse: 68 78 63   Resp: 16 15 14    Temp: 98 F (36.7 C) 97.9 F (36.6 C) (!) 97.5 F (36.4 C) (!) 97.5 F (36.4 C)  TempSrc: Oral Oral Oral   SpO2: 100% 100% 100%   Weight:      Height:        Intake/Output Summary (Last 24 hours) at 06/19/2018 1116 Last data filed at 06/19/2018 0544 Gross per 24 hour  Intake 720 ml  Output 1400 ml  Net -680 ml   Filed Weights   06/06/18 2246  06/14/18 1151  Weight: 123.8 kg 123.8 kg    General appearance: Awake alert.  In no distress. Resp: Clear to auscultation bilaterally.  Normal effort. Cardio: S1-S2 is normal regular.  No S3-S4.  No rubs murmurs or bruit.  Telemetry did not show any arrhythmias. GI: Abdomen is soft.  Nontender nondistended.  Bowel sounds are present.  No masses organomegaly Extremities: Minimal edema in the lower extremity Neurological: No focal neurological deficits  Lab Results:  Data Reviewed: I have personally reviewed following labs and imaging studies  CBC: Recent Labs  Lab 06/14/18 0559 06/15/18 0324 06/17/18 0753 06/18/18 0337 06/19/18 0328  WBC 10.8* 15.8* 22.2* 19.8* 21.8*  NEUTROABS  --   --  18.0*  --   --   HGB 8.9* 9.3* 8.5* 7.9* 7.4*  HCT 28.6* 30.1* 28.2* 25.9* 24.4*  MCV 91.1 92.6 93.1 92.8 93.5  PLT 315 241 180 148* 129*    Basic Metabolic Panel: Recent Labs  Lab 06/13/18 0500 06/15/18 0324 06/17/18 0519 06/18/18 0337 06/19/18 0328  NA 139 136 132* 132* 132*  K 4.1 4.5 4.6 4.5 4.7  CL 107 102 94* 95* 97*  CO2 25 23 24 24 24   GLUCOSE 131* 181* 151* 160* 189*  BUN 19 15 43* 54* 42*  CREATININE 1.00 1.03 1.48* 1.45* 0.96  CALCIUM 9.0 9.2 9.6 9.3 9.0    GFR: Estimated Creatinine Clearance: 117 mL/min (by C-G formula based on SCr of 0.96 mg/dL).  Liver Function Tests: Recent Labs  Lab 06/13/18 0500  AST 55*  ALT 47*  ALKPHOS 71  BILITOT 0.3  PROT 6.3*  ALBUMIN 2.5*     Recent Results (from the past 240 hour(s))  Body fluid culture     Status: None   Collection Time: 06/09/18  2:12 PM  Result Value Ref Range Status   Specimen Description SYNOVIAL  Final   Special Requests RIGHT KNEE  Final   Gram Stain   Final    MODERATE WBC PRESENT, PREDOMINANTLY PMN NO ORGANISMS SEEN    Culture   Final    NO GROWTH 3 DAYS Performed at [facility], [address], [place], [place] [address]    Report Status 06/12/2018 FINAL  Final  MRSA PCR Screening      Status: None   Collection Time: 06/14/18  1:10 AM  Result Value Ref Range Status   MRSA by PCR NEGATIVE NEGATIVE Final    Comment:        The GeneXpert MRSA Assay (FDA approved for NASAL specimens only), is one component of a comprehensive MRSA colonization surveillance program. It is not intended to diagnose MRSA infection nor to guide or monitor treatment for MRSA infections. Performed at [facility], [address], [place], [place] [address]   Surgical PCR screen     Status: Abnormal   Collection Time: 06/14/18  4:40 AM  Result Value Ref Range Status   MRSA, PCR NEGATIVE NEGATIVE Final   Staphylococcus aureus POSITIVE (A) NEGATIVE Final    Comment: (NOTE) The Xpert SA Assay (FDA approved for NASAL specimens in patients 24 years of age and older), is one component of a comprehensive surveillance program. It is not intended to diagnose infection nor to guide or monitor treatment. Performed at [facility], [address], [place], [place] [address]       Radiology Studies: Dg Chest Port 1 View  Result Date: 06/19/2018 CLINICAL DATA:  COUGH,HX DVT and PE AFTER CERVICAL SURGERY EXAM: PORTABLE CHEST 1 VIEW COMPARISON:  04/26/2018 FINDINGS: The heart size and mediastinal contours are within normal limits. Both lungs are clear. The visualized skeletal structures are unremarkable. IMPRESSION: No active disease. Electronically Signed   By: Norva Pavlov M.D.   On: 06/19/2018 10:06     Medications:  Scheduled: . dexamethasone  4 mg Intravenous Q6H  . febuxostat  40 mg Oral Daily  . gabapentin  300 mg Oral TID  . Influenza vac split quadrivalent PF  0.5 mL Intramuscular Tomorrow-1000  . methocarbamol  750 mg Oral QID  . pantoprazole  40 mg Oral BID  . polyethylene glycol  17 g Oral BID  . sodium chloride flush  3 mL Intravenous Q12H   Continuous: . sodium chloride 250 mL (06/16/18 1626)  . sodium chloride 1 mL (06/19/18 0544)  . methocarbamol  (ROBAXIN) IV 500 mg (06/10/18 1839)   BJY:NWGNFAOZHYQMV **OR** acetaminophen, acetaminophen **OR** acetaminophen, alum & mag hydroxide-simeth, benzonatate, hydrALAZINE, HYDROmorphone (DILAUDID) injection, menthol-cetylpyridinium **OR** phenol, methocarbamol (ROBAXIN) IV, methocarbamol, morphine injection, ondansetron **OR** ondansetron (ZOFRAN) IV, ondansetron **OR** ondansetron (ZOFRAN) IV, oxyCODONE, oxyCODONE, sodium chloride flush    Assessment/Plan:  Near syncope likely due to orthostatic hypotension Orthostatic hypotension likely a combination of hypovolemia as well as autonomic dysfunction.  He has TED stockings.  Patient was given IV fluids.  His BUN and creatinine have improved.  Creatinine is back to normal.  Continue to encourage oral intake.  Cut back on IV fluids.  He should get up slowly from a lying  or sitting position to avoid sudden drop in blood pressure.  Telemetry has not shown any arrhythmias.  Echocardiogram done in July showed normal systolic function.  Grade 2 diastolic dysfunction was noted.  No significant valvular abnormalities were present.  Cough This is a dry cough.  Tessalon Perles were prescribed yesterday.  Lungs are clear to auscultation on examination.  Chest x-ray does not show any active disease.  Continue symptomatic treatment.  Patient denies any postnasal drip, history of acid reflux or history of asthma.  Mild acute renal failure Most likely due to hypovolemia and the fact that he received Lasix on 10/4.  Renal function has improved with IV fluids.  Cut back on the fluid rate.    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  Patient underwent surgery on 10/2.  Patient appears to be stable from a neurosurgical standpoint.  He remains on dexamethasone which was initiated by neurosurgery.  Will defer to them.    Polyarthralgia/persistent fever and elevated inflammatory markers Patient symptoms mainly involved shoulders ankles.   He also has a history of gout as a result of which he has pain in his knees.  Has chronic lower back pain.  Reason for his polyarthralgia is not entirely clear.  Patient was found to have joint effusion in both knees.  Arthrocentesis was performed without any evidence for infection.    Lower extremity edema Apparently has been there since his previous surgery but worse in the last few days.  Patient recently started on amlodipine which caused lower extremity edema.  Amlodipine was discontinued.  He was given 1 dose of Lasix.  Hold off on further doses of diuretics.  Bilateral knee pain status post arthrocentesis Possible gout versus pseudogout.  Now improved.  Polymyalgia and polyarthralgia CK level 60.  CRP 18.  ANCA negative.  Aldolase negative.  CCP weakly positive.  ANA negative.  Case was discussed by previous rounding physician with rheumatologist Dr. Deanne Coffer (cell phone--[phone]).  Will need outpatient follow-up with him.  Patient was on prednisone however switched over to dexamethasone by neurosurgery.  Steroids should be continued until the patient has had a chance to follow-up with the rheumatology.    Essential hypertension Patient was on amlodipine.  However he was noted to have lower extremity edema so the amlodipine was discontinued.  Blood pressure remained stable.  Hold off on starting any new antihypertensives considering his orthostatic hypotension.   Recent PE in the setting of spine surgery Patient status post IVC filter placement on July 16.  Patient was on apixaban.  Changed over to IV heparin.  Anticoagulation was discontinued for surgery.  To resume once okay with neurosurgery.  He does have an IVC filter in place.  Discussed with Dr. Wynetta Emery.  He would like to hold off anticoagulation for at least 7 days postop.  This is because during his previous hospitalization when he was placed on anticoagulation he did experience some bleeding at the surgical site so he remains at high  risk for same.  So the earliest that the apixaban can be resumed will be 10/9.  This will need to occur in close consultation with Dr. Wynetta Emery.  History of gout On [place]  Normocytic anemia Slight downward trend in hemoglobin is likely dilutional.  No overt bleeding noted.  Elevated WBC is due to steroids.  Continue to watch for now.    DVT Prophylaxis: SCDs. Code Status: Full code Family Communication: Discussed with the patient. Disposition Plan: Management as outlined  above.  CIR recommended by physical and Occupational Therapy.  Cut back on IV fluids.  Hopefully to inpatient rehabilitation soon.    LOS: 12 days   Osvaldo Shipper  [facility] Pager [phone] 06/19/2018, 11:16 AM  If 7PM-7AM, please contact night-coverage at [email or web address], password [facility]

## 2018-06-19 NOTE — Progress Notes (Signed)
NEUROSURGERY PROGRESS NOTE  Doing well. Complains of no pain right now.  Good strength and sensation Incision CDI  Temp:  [97.5 F (36.4 C)-98 F (36.7 C)] 97.5 F (36.4 C) (10/07 0900) Pulse Rate:  [63-78] 63 (10/07 0800) Resp:  [14-16] 14 (10/07 0800) BP: (123-133)/(71-82) 133/81 (10/07 0800) SpO2:  [100 %] 100 % (10/07 0800)  Plan: Has not worked with PT yet today. States that he has been having orthostatic hypotension. Will see how he does with PT today. Awaiting rehab approval with insurance.   Sherryl Manges, NP 06/19/2018 2:36 PM

## 2018-06-19 NOTE — Progress Notes (Addendum)
Occupational Therapy Treatment Patient Details Name: [name] [name] MRN: [id number] DOB: [birth year] Today's Date: 06/19/2018    History of present illness  52 y.o. male with recent ACDF at C3 C5 by Dr. Wynetta Emery in July 2019 complicated by pulmonary embolism was placed on apixaban subsequently discharged to rehab and then SNF.  Pt underwent posterior cervical decompressive laminectomy, foraminotomies, and cervical fusion C3,C4, C5 06/14/18.  PMH including gout, HTN, and pulmonary embolism. Pt presenting post fall after his legs gave away after getting out of the car. MRI of cervical and lumbar spine showing no change or infection per Dr. Wynetta Emery. MR of right knee showing complex tear of the posterior horn of the medial meniscus. Right knee aspirated on 06/09/18.    OT comments  Pt presents supine in bed motivated to work with therapy. Pt able to sit EOB and perform x1 sit<>stand briefly at [facility] with pt reporting no dizziness/lightheadednes; returned to sitting due to LE fatigue. Pt requiring mod-maxA+2 for sit<>Stand at [facility] and to maintain static standing. Continues to require totalA for LB ADLs at this time. Pt motivated to work with therapy and progress to PLOF. Continue per POC.    Follow Up Recommendations  CIR;Supervision/Assistance - 24 hour    Equipment Recommendations  None recommended by OT          Precautions / Restrictions Precautions Precautions: Fall;Cervical Required Braces or Orthoses: Cervical Brace Knee Immobilizer - Right: On when out of bed or walking Cervical Brace: Hard collar Other Brace/Splint: Bledsoe brace R leg locked into extension.  Restrictions Weight Bearing Restrictions: No       Mobility Bed Mobility Overal bed mobility: Needs Assistance Bed Mobility: Rolling;Sit to Sidelying Rolling: Mod assist;+2 for physical assistance;+2 for safety/equipment       Sit to sidelying: Max assist;+2 for physical assistance;+2 for safety/equipment General bed mobility  comments: assist for trunk and LEs onto EOB when returning to sidelying  Transfers Overall transfer level: Needs assistance   Transfers: Sit to/from Stand Sit to Stand: +2 physical assistance;Mod assist;From elevated surface         General transfer comment: VCs hand placement with use of bed pad to assist with raising hips from EOB; assist to boost into standing    Balance Overall balance assessment: Needs assistance Sitting-balance support: Feet supported;Bilateral upper extremity supported Sitting balance-Leahy Scale: Poor     Standing balance support: Bilateral upper extremity supported Standing balance-Leahy Scale: Poor Standing balance comment: used steady standing frame and external assist.                            ADL either performed or assessed with clinical judgement   ADL Overall ADL's : Needs assistance/impaired                     Lower Body Dressing: Total assistance;Sit to/from stand Lower Body Dressing Details (indicate cue type and reason): to don Ted's and non slip socks                     Vision       Perception     Praxis      Cognition Arousal/Alertness: Awake/alert Behavior During Therapy: WFL for tasks assessed/performed Overall Cognitive Status: Within Functional Limits for tasks assessed  Exercises     Shoulder Instructions       General Comments      Pertinent Vitals/ Pain       Pain Assessment: Faces Faces Pain Scale: Hurts even more Pain Location: neck  Pain Descriptors / Indicators: Operative site guarding Pain Intervention(s): Monitored during session;Repositioned;Limited activity within patient's tolerance  Home Living                                          Prior Functioning/Environment              Frequency  Min 2X/week        Progress Toward Goals  OT Goals(current goals can now be found in the  care plan section)  Progress towards OT goals: Progressing toward goals  Acute Rehab OT Goals Patient Stated Goal: to be able to walk again  OT Goal Formulation: With patient Time For Goal Achievement: 06/29/18 Potential to Achieve Goals: Good ADL Goals Pt Will Perform Grooming: with min guard assist;standing Pt Will Perform Lower Body Dressing: with min guard assist;sit to/from stand;with adaptive equipment Pt Will Transfer to Toilet: with min guard assist;ambulating;bedside commode Pt Will Perform Toileting - Clothing Manipulation and hygiene: with min guard assist;sit to/from stand;sitting/lateral leans  Plan Discharge plan remains appropriate    Co-evaluation    PT/OT/SLP Co-Evaluation/Treatment: Yes Reason for Co-Treatment: For patient/therapist safety;To address functional/ADL transfers   OT goals addressed during session: Strengthening/ROM;Proper use of Adaptive equipment and DME      AM-PAC PT "6 Clicks" Daily Activity     Outcome Measure   Help from another person eating meals?: None Help from another person taking care of personal grooming?: A Little Help from another person toileting, which includes using toliet, bedpan, or urinal?: Total Help from another person bathing (including washing, rinsing, drying)?: A Lot Help from another person to put on and taking off regular upper body clothing?: A Lot Help from another person to put on and taking off regular lower body clothing?: Total 6 Click Score: 13    End of Session Equipment Utilized During Treatment: Other (comment)(bledsoe brace, Stedy)  OT Visit Diagnosis: Unsteadiness on feet (R26.81);Pain;Muscle weakness (generalized) (M62.81) Pain - Right/Left: Right Pain - part of body: Knee   Activity Tolerance Patient tolerated treatment well   Patient Left in bed;with call bell/phone within reach   Nurse Communication Mobility status        Time: 9147-8295 OT Time Calculation (min): 32 min  Charges: OT  General Charges $OT Visit: 1 Visit OT Treatments $Therapeutic Activity: 8-22 mins  Marcy Siren, OT Supplemental Rehabilitation Services Pager [phone] Office [phone]    Orlando Penner 06/19/2018, 6:01 PM

## 2018-06-19 NOTE — Progress Notes (Signed)
Inpatient Rehabilitation-Admissions Coordinator   [facility] has received insurance approval for CIR. AC will follow for therapy tolerance; noted orthostatic hypotension during therapy sessions limiting activity and progression. Pt aware of approval.   Please call if questions  Nanine Means, OTR/L  Rehab Admissions Coordinator  [phone] 06/19/2018 5:00 PM

## 2018-06-19 NOTE — Progress Notes (Signed)
Physical Therapy Treatment Patient Details Name: [name] [name] MRN: [id number] DOB: 14-Sep-1965 Today's Date: 06/19/2018    History of Present Illness  52 y.o. male with recent ACDF at C3 C5 by Dr. Wynetta Emery in July 2019 complicated by pulmonary embolism was placed on apixaban subsequently discharged to rehab and then SNF.  Pt underwent posterior cervical decompressive laminectomy, foraminotomies, and cervical fusion C3,C4, C5 06/14/18.  PMH including gout, HTN, and pulmonary embolism. Pt presenting post fall after his legs gave away after getting out of the car. MRI of cervical and lumbar spine showing no change or infection per Dr. Wynetta Emery. MR of right knee showing complex tear of the posterior horn of the medial meniscus. Right knee aspirated on 06/09/18.     PT Comments    Transitioned from Vital Go tilt to standing in the STEDY for 1-2 minutes.  Pt able to keep both knees extended enough to stay off the knee pads.  Pt was asymptomatic during the standing trial, but was too fatigued to continue.   Follow Up Recommendations  CIR     Equipment Recommendations  Wheelchair (measurements PT);Wheelchair cushion (measurements PT);Hospital bed    Recommendations for Other Services       Precautions / Restrictions Precautions Precautions: Fall;Cervical Required Braces or Orthoses: Cervical Brace Knee Immobilizer - Right: On when out of bed or walking Cervical Brace: Hard collar Other Brace/Splint: Bledsoe brace R leg locked into extension.  Restrictions Weight Bearing Restrictions: No    Mobility  Bed Mobility Overal bed mobility: Needs Assistance Bed Mobility: Rolling;Sit to Sidelying Rolling: Mod assist;+2 for physical assistance;+2 for safety/equipment       Sit to sidelying: Max assist;+2 for physical assistance;+2 for safety/equipment General bed mobility comments: assist for trunk and LEs onto EOB when returning to sidelying  Transfers Overall transfer level: Needs assistance    Transfers: Sit to/from Stand Sit to Stand: +2 physical assistance;Mod assist;From elevated surface         General transfer comment: VCs hand placement with use of bed pad to assist with raising hips from EOB; assist to boost into standing  Ambulation/Gait                 Stairs             Wheelchair Mobility    Modified Rankin (Stroke Patients Only)       Balance Overall balance assessment: Needs assistance Sitting-balance support: Feet supported;Bilateral upper extremity supported Sitting balance-Leahy Scale: Poor     Standing balance support: Bilateral upper extremity supported Standing balance-Leahy Scale: Poor Standing balance comment: used steady standing frame and external assist.                             Cognition Arousal/Alertness: Awake/alert Behavior During Therapy: WFL for tasks assessed/performed Overall Cognitive Status: Within Functional Limits for tasks assessed                                        Exercises      General Comments General comments (skin integrity, edema, etc.): Vital Go used to stand up to 45* first attempt for 5 min --BP 143/95  assymptomatic.  Initially L Knee extended, R knee slowly flexing.  When both were flexed, pt returned to supine.  Left to find RN to teach about the Vital Go.  [name] RN, intructed  in use of Vital Go remote and participated in another tilt. to 50* in increments.  Pt worked on maximal standing effort on/off for about 5 min.  BP 127/126 at 50*.  At no time did pt experience any diaphoresis or lightheadedness Pt started sinking against the straps, so pt returned to supine.      Pertinent Vitals/Pain Pain Assessment: Faces Faces Pain Scale: Hurts even more Pain Location: neck  Pain Descriptors / Indicators: Operative site guarding Pain Intervention(s): Monitored during session    Home Living                      Prior Function            PT Goals  (current goals can now be found in the care plan section) Acute Rehab PT Goals Patient Stated Goal: to be able to walk again  PT Goal Formulation: With patient Time For Goal Achievement: 06/26/18 Potential to Achieve Goals: Good Progress towards PT goals: Progressing toward goals    Frequency    Min 5X/week      PT Plan Current plan remains appropriate;Frequency needs to be updated    Co-evaluation PT/OT/SLP Co-Evaluation/Treatment: Yes Reason for Co-Treatment: For patient/therapist safety PT goals addressed during session: Mobility/safety with mobility OT goals addressed during session: Strengthening/ROM      AM-PAC PT "6 Clicks" Daily Activity  Outcome Measure  Difficulty turning over in bed (including adjusting bedclothes, sheets and blankets)?: Unable Difficulty moving from lying on back to sitting on the side of the bed? : Unable Difficulty sitting down on and standing up from a chair with arms (e.g., wheelchair, bedside commode, etc,.)?: Unable Help needed moving to and from a bed to chair (including a wheelchair)?: A Lot Help needed walking in hospital room?: Total Help needed climbing 3-5 steps with a railing? : Total 6 Click Score: 7    End of Session Equipment Utilized During Treatment: Gait belt;Cervical collar;Other (comment)(bledsoe brace) Activity Tolerance: Other (comment) Patient left: in bed;with call bell/phone within reach Nurse Communication: Mobility status;Need for lift equipment;Other (comment) PT Visit Diagnosis: Other abnormalities of gait and mobility (R26.89);Other symptoms and signs involving the nervous system (R29.898);Muscle weakness (generalized) (M62.81)     Time: 1610-9604 PT Time Calculation (min) (ACUTE ONLY): 32 min  Charges:  $Therapeutic Activity: 8-22 mins                     06/19/2018  Cascade Bing, PT Acute Rehabilitation Services [phone]  (pager) [phone]  (office)   [name] Stanlee Roehrig 06/19/2018, 6:14  PM

## 2018-06-20 LAB — BASIC METABOLIC PANEL
Anion gap: 6 (ref 5–15)
BUN: 34 mg/dL — ABNORMAL HIGH (ref 6–20)
CALCIUM: 9.1 mg/dL (ref 8.9–10.3)
CO2: 27 mmol/L (ref 22–32)
Chloride: 101 mmol/L (ref 98–111)
Creatinine, Ser: 0.75 mg/dL (ref 0.61–1.24)
GFR calc Af Amer: >60 mL/min (ref >60)
Glucose, Bld: 128 mg/dL — ABNORMAL HIGH (ref 70–99)
Potassium: 4.9 mmol/L (ref 3.5–5.1)
Sodium: 134 mmol/L — ABNORMAL LOW (ref 135–145)

## 2018-06-20 LAB — CBC
HCT: 25.3 % — ABNORMAL LOW (ref 39.0–52.0)
Hemoglobin: 7.6 g/dL — ABNORMAL LOW (ref 13.0–17.0)
MCH: 28.7 pg (ref 26.0–34.0)
MCHC: 30 g/dL (ref 30.0–36.0)
MCV: 95.5 fL (ref 80.0–100.0)
PLATELETS: 116 10*3/uL — AB (ref 150–400)
RBC: 2.65 MIL/uL — AB (ref 4.22–5.81)
RDW: 16.6 % — AB (ref 11.5–15.5)
WBC: 20.9 10*3/uL — ABNORMAL HIGH (ref 4.0–10.5)

## 2018-06-20 LAB — TSH: TSH: 1.245 u[IU]/mL (ref 0.350–4.500)

## 2018-06-20 MED ORDER — MIDODRINE HCL 5 MG PO TABS
2.5000 mg | ORAL_TABLET | Freq: Three times a day (TID) | ORAL | Status: DC
  Administered 2018-06-20 – 2018-06-21 (×5): 2.5 mg via ORAL
  Filled 2018-06-20 (×5): qty 1

## 2018-06-20 NOTE — Care Management Note (Signed)
Case Management Note  Patient Details  Name: Won Kreuzer MRN: [id number] Date of Birth: 1966/09/08  Subjective/Objective:   52 y.o. male with recent ACDF at C3 C5 by Dr. Wynetta Emery in July 2019 complicated by pulmonary embolism was placed on apixaban subsequently discharged to rehab and then SNF.  Pt underwent posterior cervical decompressive laminectomy, foraminotomies, and cervical fusion C3,C4, C5 06/14/18.  PTA, pt needed assistance with ADLS; uses RW and knee brace.  He lives with his parents.                Action/Plan: PT/OT recommending CIR; admission to rehab on hold due to BP issues.  Will continue to follow progress.  Expected Discharge Date:                  Expected Discharge Plan:  IP Rehab Facility  In-House Referral:     Discharge planning Services  CM Consult  Post Acute Care Choice:    Choice offered to:     DME Arranged:    DME Agency:     HH Arranged:    HH Agency:     Status of Service:  In process, will continue to follow  If discussed at Long Length of Stay Meetings, dates discussed:    Additional Comments:  Quintella Baton, RN, BSN  Trauma/Neuro ICU Case Manager [phone]

## 2018-06-20 NOTE — Progress Notes (Signed)
[facility] PROGRESS NOTE  [name] [name] ZOX:[id number] DOB: 1966-07-03 DOA: 06/06/2018  PCP: Devra Dopp, MD  Brief History/Interval Summary: 52 year old African-American male who underwent cervical spine surgery in July complicated by DVT and PE.  IVC filter was placed followed by hematology oncology.  Had an extended stay in rehabilitation.  Has been on anticoagulation.  Presented to an outside facility with increasing weakness possible pneumonia.  Was transferred here to [facility].  Has been having polyarthralgias.  Seen by neurosurgery.  Underwent posterior cervical decompression.  Consultants: Neurosurgery.  Phone discussion with rheumatology.  Procedures:  On 10/2 1. posterior cervical decompressive laminectomy at C3, C4, C5 with removal of the spinous process and complete laminectomy and foraminotomies of the C3, C4, C5 nerve roots. 2.  Posterior cervical fusion with lateral mass screws at C3-C4-C5 and pars screws at C2 lies in the globus ellipse Lateral mass screw system 3.  Posterior lateral arthrodesis C2-C5 utilizing locally harvested autograft mixed with vivigen  Antibiotics: None  Subjective/Interval History: Patient remains very anxious.  He was able to stand upright yesterday without any symptoms.  According to the PT and OT notes he was asymptomatic.  However patient tells me that when he leaned forward he felt a little dizzy.  Did not have any passing out episodes.    ROS: Denies any headaches.  Objective:  Vital Signs  Vitals:   06/19/18 1642 06/19/18 2028 06/19/18 2300 06/20/18 0300  BP:  133/77 129/81 124/78  Pulse:  60 (!) 53 60  Resp:  15  14  Temp: 98.5 F (36.9 C) 97.8 F (36.6 C) 97.7 F (36.5 C) (!) 97.5 F (36.4 C)  TempSrc:  Oral Oral Oral  SpO2:  100% 100% 100%  Weight:      Height:        Intake/Output Summary (Last 24 hours) at 06/20/2018 1100 Last data filed at 06/19/2018 2300 Gross per 24 hour  Intake 360 ml  Output 1000  ml  Net -640 ml   Filed Weights   06/06/18 2246 06/14/18 1151  Weight: 123.8 kg 123.8 kg   Telemetry shows heart rate in the 60s.  No arrhythmias noted.  General appearance: Awake alert.  In no distress Resp: Clear to auscultation bilaterally.  Normal effort. Cardio: S1-S2 is normal regular.  No S3-S4.  No rubs murmurs or bruit GI: Abdomen is soft.  Nontender nondistended.  Bowel sounds are present normal.  No masses organomegaly Extremities: Minimal edema in the lower extremities Neurological: Focal neurological deficits.  Seems to be anxious.  Lab Results:  Data Reviewed: I have personally reviewed following labs and imaging studies  CBC: Recent Labs  Lab 06/15/18 0324 06/17/18 0753 06/18/18 0337 06/19/18 0328 06/20/18 0325  WBC 15.8* 22.2* 19.8* 21.8* 20.9*  NEUTROABS  --  18.0*  --   --   --   HGB 9.3* 8.5* 7.9* 7.4* 7.6*  HCT 30.1* 28.2* 25.9* 24.4* 25.3*  MCV 92.6 93.1 92.8 93.5 95.5  PLT 241 180 148* 129* 116*    Basic Metabolic Panel: Recent Labs  Lab 06/15/18 0324 06/17/18 0519 06/18/18 0337 06/19/18 0328 06/20/18 0325  NA 136 132* 132* 132* 134*  K 4.5 4.6 4.5 4.7 4.9  CL 102 94* 95* 97* 101  CO2 23 24 24 24 27   GLUCOSE 181* 151* 160* 189* 128*  BUN 15 43* 54* 42* 34*  CREATININE 1.03 1.48* 1.45* 0.96 0.75  CALCIUM 9.2 9.6 9.3 9.0 9.1    GFR: Estimated Creatinine Clearance: 140.4  mL/min (by C-G formula based on SCr of 0.75 mg/dL).   Recent Results (from the past 240 hour(s))  MRSA PCR Screening     Status: None   Collection Time: 06/14/18  1:10 AM  Result Value Ref Range Status   MRSA by PCR NEGATIVE NEGATIVE Final    Comment:        The GeneXpert MRSA Assay (FDA approved for NASAL specimens only), is one component of a comprehensive MRSA colonization surveillance program. It is not intended to diagnose MRSA infection nor to guide or monitor treatment for MRSA infections. Performed at [facility], [address],  [place], [place] [address]   Surgical PCR screen     Status: Abnormal   Collection Time: 06/14/18  4:40 AM  Result Value Ref Range Status   MRSA, PCR NEGATIVE NEGATIVE Final   Staphylococcus aureus POSITIVE (A) NEGATIVE Final    Comment: (NOTE) The Xpert SA Assay (FDA approved for NASAL specimens in patients 15 years of age and older), is one component of a comprehensive surveillance program. It is not intended to diagnose infection nor to guide or monitor treatment. Performed at [facility], [address], [place], [place] [address]       Radiology Studies: Dg Chest Port 1 View  Result Date: 06/19/2018 CLINICAL DATA:  COUGH,HX DVT and PE AFTER CERVICAL SURGERY EXAM: PORTABLE CHEST 1 VIEW COMPARISON:  04/26/2018 FINDINGS: The heart size and mediastinal contours are within normal limits. Both lungs are clear. The visualized skeletal structures are unremarkable. IMPRESSION: No active disease. Electronically Signed   By: Norva Pavlov M.D.   On: 06/19/2018 10:06     Medications:  Scheduled: . dexamethasone  4 mg Intravenous Q6H  . febuxostat  40 mg Oral Daily  . gabapentin  300 mg Oral TID  . Influenza vac split quadrivalent PF  0.5 mL Intramuscular Tomorrow-1000  . methocarbamol  750 mg Oral QID  . midodrine  2.5 mg Oral TID WC  . pantoprazole  40 mg Oral BID  . polyethylene glycol  17 g Oral BID  . sodium chloride flush  3 mL Intravenous Q12H   Continuous: . sodium chloride 250 mL (06/16/18 1626)  . sodium chloride 50 mL/hr at 06/20/18 0400  . methocarbamol (ROBAXIN) IV 500 mg (06/10/18 1839)   NWG:[id number] **OR** acetaminophen, acetaminophen **OR** acetaminophen, alum & mag hydroxide-simeth, benzonatate, hydrALAZINE, HYDROmorphone (DILAUDID) injection, menthol-cetylpyridinium **OR** phenol, methocarbamol (ROBAXIN) IV, methocarbamol, morphine injection, ondansetron **OR** ondansetron (ZOFRAN) IV, ondansetron **OR** ondansetron (ZOFRAN) IV, oxyCODONE, oxyCODONE,  sodium chloride flush    Assessment/Plan:  Near syncope likely due to orthostatic hypotension Orthostatic hypotension likely a combination of hypovolemia as well as autonomic dysfunction.  He has TED stockings.  Patient was given IV fluids.  His BUN and creatinine have improved.  Creatinine is back to normal.  Continue to encourage oral intake.  IV fluid rate was decreased.  Telemetry does not show any arrhythmias.  Heart rate noted to be somewhat bradycardic.  Sinus rhythm.  Discussed with Dr. Wynetta Emery with neurosurgery.  We will start with a low-dose of midodrine.  Will need to watch his heart rate very carefully.  Continue telemetry monitoring for now.  He should get up slowly from a lying or sitting position to avoid sudden drop in blood pressure.  Echocardiogram done in July showed normal systolic function.  Grade 2 diastolic dysfunction was noted.  No significant valvular abnormalities were present.  Cough Cough appears to have improved.  Tessalon  Perles were prescribed.  Chest x-ray does not show any active disease.  Continue symptomatic treatment.  Patient denies any postnasal drip, history of acid reflux or history of asthma.  Mild acute renal failure Most likely due to hypovolemia and the fact that he received Lasix on 10/4.  Renal function has improved with IV fluids.  IV fluid rate was decreased.  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  Patient underwent surgery on 10/2.  Patient appears to be stable from a neurosurgical standpoint.  He remains on dexamethasone which was initiated by neurosurgery.  Will defer to them.    Polyarthralgia/persistent fever and elevated inflammatory markers Patient symptoms mainly involved shoulders ankles.  He also has a history of gout as a result of which he has pain in his knees.  Has chronic lower back pain.  Reason for his polyarthralgia is not entirely clear.  Patient was found to have joint effusion in both knees.   Arthrocentesis was performed without any evidence for infection.    Lower extremity edema Apparently has been there since his previous surgery but worse in the last few days.  Patient recently started on amlodipine which caused lower extremity edema.  Amlodipine was discontinued.  He was given 1 dose of Lasix.  Hold off on further doses of diuretics.  Bilateral knee pain status post arthrocentesis Possible gout versus pseudogout.  Now improved.  Polymyalgia and polyarthralgia CK level 60.  CRP 18.  ANCA negative.  Aldolase negative.  CCP weakly positive.  ANA negative.  Case was discussed by previous rounding physician with rheumatologist Dr. Deanne Coffer (cell phone--[phone]).  Will need outpatient follow-up with him.  Patient was on prednisone however switched over to dexamethasone by neurosurgery.  Steroids should be continued until the patient has had a chance to follow-up with the rheumatology.    Essential hypertension Patient was on amlodipine.  However he was noted to have lower extremity edema so the amlodipine was discontinued.  Blood pressure remained stable.  Hold off on starting any new antihypertensives considering his orthostatic hypotension.   Recent PE in the setting of spine surgery Patient status post IVC filter placement on July 16.  Patient was on apixaban.  Changed over to IV heparin.  Anticoagulation was discontinued for surgery.  To resume once okay with neurosurgery.  He does have an IVC filter in place.  Discussed with Dr. Wynetta Emery.  He would like to hold off anticoagulation for at least 7 days postop.  This is because during his previous hospitalization when he was placed on anticoagulation he did experience some bleeding at the surgical site so he remains at high risk for same.  So the earliest that the apixaban can be resumed will be 10/9.  This will need to occur in close consultation with Dr. Wynetta Emery.  History of gout On Uloric  Normocytic anemia Slight downward trend in  hemoglobin likely dilutional from all the IV fluids he received.  No evidence of overt bleeding.  Hemoglobin remains stable.  Elevated WBC is due to steroids.  He remains afebrile.  DVT Prophylaxis: SCDs. Code Status: Full code Family Communication: Discussed with the patient. Disposition Plan: Patient continues to feel anxious.  Continues to have symptoms due to orthostatic hypotension.  We will start him on low-dose midodrine.  Continue telemetry monitoring.  Continue PT and OT.  CIR is following as well.    LOS: 13 days   Osvaldo Shipper  [facility] Pager [phone] 06/20/2018, 11:00 AM  If  7PM-7AM, please contact night-coverage at [email or web address], password [facility]

## 2018-06-20 NOTE — Progress Notes (Signed)
Physical Therapy Treatment Patient Details Name: [name] [name] MRN: [id number] DOB: 20-Sep-1965 Today's Date: 06/20/2018    History of Present Illness  52 y.o. male with recent ACDF at C3 C5 by Dr. Wynetta [name] in July 2019 complicated by pulmonary embolism was placed on apixaban subsequently discharged to rehab and then SNF.  Pt underwent posterior cervical decompressive laminectomy, foraminotomies, and cervical fusion C3,C4, C5 06/14/18.  PMH including gout, HTN, and pulmonary embolism. Pt presenting post fall after his legs gave away after getting out of the car. MRI of cervical and lumbar spine showing no change or infection per Dr. Wynetta [name]. MR of right knee showing complex tear of the posterior horn of the medial meniscus. Right knee aspirated on 06/09/18.     PT Comments    No sign of BP falling at 62* of tilt on Vital Go bed.   Follow Up Recommendations  CIR     Equipment Recommendations  Other (comment)(TBA)    Recommendations for Other Services       Precautions / Restrictions Precautions Precautions: Fall;Cervical    Mobility  Bed Mobility                  Transfers                    Ambulation/Gait                 Stairs             Wheelchair Mobility    Modified Rankin (Stroke Patients Only)       Balance                                            Cognition Arousal/Alertness: Awake/alert Behavior During Therapy: WFL for tasks assessed/performed Overall Cognitive Status: Within Functional Limits for tasks assessed                                        Exercises      General Comments General comments (skin integrity, edema, etc.): Instructed today's RN in use of the Vital Go so nursing can continue tilt program.  Pt was tilted to 62*, strapped tighter than yesterday's session.  pt maintained 62* for 15 min, assymptomatic.  Pt maintained quad firing thrroughout, but started right knee flexion  after 10 min.  BP  137/80 at approx 15 min.      Pertinent Vitals/Pain Pain Assessment: Faces Faces Pain Scale: Hurts even more Pain Location: neck  Pain Descriptors / Indicators: Grimacing;Guarding Pain Intervention(s): Monitored during session;Limited activity within patient's tolerance    Home Living                      Prior Function            PT Goals (current goals can now be found in the care plan section) Acute Rehab PT Goals Patient Stated Goal: to be able to walk again  PT Goal Formulation: With patient Time For Goal Achievement: 06/26/18 Potential to Achieve Goals: Good Progress towards PT goals: Progressing toward goals    Frequency    Min 5X/week      PT Plan Current plan remains appropriate;Frequency needs to be updated    Co-evaluation  AM-PAC PT "6 Clicks" Daily Activity  Outcome Measure  Difficulty turning over in bed (including adjusting bedclothes, sheets and blankets)?: Unable Difficulty moving from lying on back to sitting on the side of the bed? : Unable Difficulty sitting down on and standing up from a chair with arms (e.g., wheelchair, bedside commode, etc,.)?: Unable Help needed moving to and from a bed to chair (including a wheelchair)?: A Lot Help needed walking in hospital room?: Total Help needed climbing 3-5 steps with a railing? : Total 6 Click Score: 7    End of Session   Activity Tolerance: Patient tolerated treatment well Patient left: in bed;with call bell/phone within reach Nurse Communication: Mobility status;Need for lift equipment;Other (comment) PT Visit Diagnosis: Other abnormalities of gait and mobility (R26.89);Other symptoms and signs involving the nervous system (R29.898);Muscle weakness (generalized) (M62.81)     Time: 1020-1055 PT Time Calculation (min) (ACUTE ONLY): 35 min  Charges:  $Therapeutic Activity: 23-37 mins                     06/20/2018  [name] [name], PT Acute  Rehabilitation Services [phone]  (pager) [phone]  (office)   [name] [name] [name] [name] 06/20/2018, 4:28 PM

## 2018-06-20 NOTE — Plan of Care (Signed)

## 2018-06-21 ENCOUNTER — Inpatient Hospital Stay (HOSPITAL_COMMUNITY)

## 2018-06-21 DIAGNOSIS — G9520 Unspecified cord compression: Secondary | ICD-10-CM

## 2018-06-21 DIAGNOSIS — I951 Orthostatic hypotension: Secondary | ICD-10-CM

## 2018-06-21 DIAGNOSIS — I10 Essential (primary) hypertension: Secondary | ICD-10-CM

## 2018-06-21 DIAGNOSIS — M4712 Other spondylosis with myelopathy, cervical region: Secondary | ICD-10-CM

## 2018-06-21 DIAGNOSIS — R0989 Other specified symptoms and signs involving the circulatory and respiratory systems: Secondary | ICD-10-CM

## 2018-06-21 DIAGNOSIS — D72829 Elevated white blood cell count, unspecified: Secondary | ICD-10-CM

## 2018-06-21 DIAGNOSIS — D62 Acute posthemorrhagic anemia: Secondary | ICD-10-CM

## 2018-06-21 MED ORDER — MIDODRINE HCL 5 MG PO TABS
5.0000 mg | ORAL_TABLET | Freq: Three times a day (TID) | ORAL | Status: DC
  Administered 2018-06-21 – 2018-07-15 (×61): 5 mg via ORAL
  Filled 2018-06-21 (×65): qty 1

## 2018-06-21 MED ORDER — DEXAMETHASONE SODIUM PHOSPHATE 10 MG/ML IJ SOLN
10.0000 mg | Freq: Four times a day (QID) | INTRAMUSCULAR | Status: DC
  Administered 2018-06-21 – 2018-06-25 (×17): 10 mg via INTRAVENOUS
  Filled 2018-06-21 (×16): qty 1

## 2018-06-21 NOTE — Progress Notes (Signed)
Subjective: Patient reports worsening neck pain last night. He was unable to get comfortable because of the left sided neck pain. He also reports his right hand grip worsening.   Objective: Vital signs in last 24 hours: Temp:  [97.5 F (36.4 C)-97.9 F (36.6 C)] 97.5 F (36.4 C) (10/09 0724) Pulse Rate:  [55-78] 56 (10/09 0724) Resp:  [14-19] 16 (10/09 0724) BP: (120-146)/(77-94) 133/87 (10/09 0724) SpO2:  [100 %] 100 % (10/09 0724)  Intake/Output from previous day: 10/08 0701 - 10/09 0700 In: 840 [P.O.:840] Out: 1950 [Urine:1950] Intake/Output this shift: Total I/O In: -  Out: 300 [Urine:300]  Neurologic: Grossly normal  Lab Results: Lab Results  Component Value Date   WBC 20.9 (H) 06/20/2018   HGB 7.6 (L) 06/20/2018   HCT 25.3 (L) 06/20/2018   MCV 95.5 06/20/2018   PLT 116 (L) 06/20/2018   Lab Results  Component Value Date   INR 1.37 06/07/2018   BMET Lab Results  Component Value Date   NA 134 (L) 06/20/2018   K 4.9 06/20/2018   CL 101 06/20/2018   CO2 27 06/20/2018   GLUCOSE 128 (H) 06/20/2018   BUN 34 (H) 06/20/2018   CREATININE 0.75 06/20/2018   CALCIUM 9.1 06/20/2018    Studies/Results: Dg Chest Port 1 View  Result Date: 06/19/2018 CLINICAL DATA:  COUGH,HX DVT and PE AFTER CERVICAL SURGERY EXAM: PORTABLE CHEST 1 VIEW COMPARISON:  04/26/2018 FINDINGS: The heart size and mediastinal contours are within normal limits. Both lungs are clear. The visualized skeletal structures are unremarkable. IMPRESSION: No active disease. Electronically Signed   By: Norva Pavlov M.D.   On: 06/19/2018 10:06    Assessment/Plan: Increased steroids to 10mg  q6 hours. Continue therapies today. Waiting BP stabilization for CIR placement.    LOS: 14 days    Tiana Loft Linetta Regner 06/21/2018, 8:37 AM

## 2018-06-21 NOTE — Progress Notes (Signed)
PROGRESS NOTE    [name] [name]  ZOX:[id number] DOB: 10-04-1965 DOA: 06/06/2018 PCP: Devra Dopp, MD    Brief Narrative: 52 year old man with past medical history relevant for gout, hypertension anterior cervical corpectomy of C4 on 03/24/2018 with comp gated postoperative course including development of multiple PEs and DVTs started on anticoagulation and subsequently developed hematoma requiring reexploration on 03/28/2018 status post placement of IVC filter who was discharged to skilled nursing facility and had a complicated course including progressive lower extremity weakness, intermittent fevers of unclear etiology and hospitalizations for "pneumonia".  Patient was admitted to this hospital is a transfer from [facility] with MRI of C-spine that showed postoperative changes and cord compression with cord edema and concern for ischemia.  Patient is status post posterior cervical decompression laminectomy of C3, C4, C5, removal of spinous processes, foraminotomies on 06/14/2018.  His postoperative course has been complicated by recurrent fevers, migratory arthralgias/arthritis that are thought to be rheumatologic in nature as well as significant orthostasis limiting the amount he can work with physical therapy.   Assessment & Plan:   Principal Problem:   Fever Active Problems:   Myelopathy (HCC)   Benign essential HTN   Cord compression (HCC)   History of pulmonary embolism   #) Orthostatic hypotension: Unclear etiology.  This was initially thought to be secondary to hypovolemia as well as autonomic dysfunction however he is not improved much with stockings and IV fluids.  He is increasingly complaining of becoming more edematous.  He was started on midodrine yesterday.  Suspect that this is either related to significant deconditioning versus his multiple spinal surgeries versus possibly an autoimmune/inflammatory cause related to his nonspecific rheumatologic disorder that he is  currently being treated for. 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  His inflammatory markers were dramatically elevated.  He did have arthrocentesis performed on 06/09/2018 that was unremarkable.  Per review of the chart ambulatory rheumatology was spoken to and recommended steroids which patient is on for his cervical spinal stenosis and surgery. - Rheumatologic work-up currently is unremarkable other than mildly elevated CCP  #) Status post cervical decompressive surgery: This appears to be improving somewhat -Neurosurgery following, appreciate recommendations -Continue IV dexamethasone, will defer dosing to neurosurgery  #) Lower extremity edema/grade 2 diastolic dysfunction: Noted on echo on 03/27/2018 -Judicious fluid use -We will check albumin and consider giving small dose of albumin with Lasix for edema  #) Perioperative DVT/PE: Patient is currently not on anticoagulation due to bleeding.  An IVC filter was placed on 03/28/2018 after reexploration of the hematoma was needed.  #) Pain/psych: -Continue gabapentin 300 mg 3 times daily  Fluids: Tolerating p.o. Elect lites: Monitor and supplement Nutrition: Regular diet  Prophylaxis: SCDs  Disposition: Pending ability to tolerate physical therapy with less autonomic instability  Full code  Consultants:   Neurosurgery [facility] inpatient rehab Orthopedic surgery Infectious disease  Procedures:  On 10/2 1. posterior cervical decompressive laminectomy at C3, C4, C5 with removal of the spinous process and complete laminectomy and foraminotomies of the C3, C4, C5 nerve roots. 2. Posterior cervical fusion with lateral mass screws at C3-C4-C5 and pars screws at C2 lies in the  globus ellipseLateral mass screw system 3. Posterior lateral arthrodesis C2-C5 utilizing locally harvested autograft mixed with vivigen  Antimicrobials:   None   Subjective: Patient is quite frustrated today due to the pain and difficulty with orthostatic symptoms when getting up out of bed.  He expresses frustration with his prolonged hospital course and how there are no answers available for him.  He is also worried about his lower extremity edema.  He denies any nausea, vomiting, diarrhea, cough, congestion.  He also continues to report neck pain.  Objective: Vitals:   06/21/18 0015 06/21/18 0400 06/21/18 0724 06/21/18 1131  BP: (!) 141/83 135/90 133/87 129/79  Pulse: 60 (!) 55 (!) 56 (!) 53  Resp: 15  16 19   Temp: 97.9 F (36.6 C) 97.6 F (36.4 C) (!) 97.5 F (36.4 C) 98.2 F (36.8 C)  TempSrc: Oral Oral Oral Oral  SpO2: 100%  100% 96%  Weight:      Height:        Intake/Output Summary (Last 24 hours) at 06/21/2018 1252 Last data filed at 06/21/2018 1132 Gross per 24 hour  Intake 600 ml  Output 1550 ml  Net -950 ml   Filed Weights   06/06/18 2246 06/14/18 1151  Weight: 123.8 kg 123.8 kg    Examination:  General exam: Appears calm and comfortable  Respiratory system: Clear to auscultation. Respiratory effort normal. Cardiovascular system: Distant heart sounds, regular rate and rhythm, no murmurs Gastrointestinal system: Abdomen is nondistended, soft and nontender. No organomegaly or masses felt. Normal bowel sounds heard. Central nervous system: Diffusely weak, 4 out of 5 strength in lower extremities Extremities: 3+ lower extremity edema Skin: Incision site is clean dry and intact Psychiatry: Judgement and insight appear normal. Mood & affect appropriate.     Data Reviewed: I have personally reviewed following labs and imaging studies  CBC: Recent Labs  Lab 06/15/18 0324 06/17/18 0753 06/18/18 0337 06/19/18 0328 06/20/18 0325  WBC 15.8* 22.2* 19.8*  21.8* 20.9*  NEUTROABS  --  18.0*  --   --   --   HGB 9.3* 8.5* 7.9* 7.4* 7.6*  HCT 30.1* 28.2* 25.9* 24.4* 25.3*  MCV 92.6 93.1 92.8 93.5 95.5  PLT 241 180 148* 129* 116*   Basic Metabolic Panel: Recent Labs  Lab 06/15/18 0324 06/17/18 0519 06/18/18 0337 06/19/18 0328 06/20/18 0325  NA 136 132* 132* 132* 134*  K 4.5 4.6 4.5 4.7 4.9  CL 102 94* 95* 97* 101  CO2 23 24 24 24 27   GLUCOSE 181* 151* 160* 189* 128*  BUN 15 43* 54* 42* 34*  CREATININE 1.03 1.48* 1.45* 0.96 0.75  CALCIUM 9.2 9.6 9.3 9.0 9.1   GFR: Estimated Creatinine Clearance: 140.4 mL/min (by C-G formula based on SCr of 0.75 mg/dL). Liver Function Tests: No results for input(s): AST, ALT, ALKPHOS, BILITOT, PROT, ALBUMIN in the last 168 hours. No results for input(s): LIPASE, AMYLASE in the last 168 hours. No results for input(s): AMMONIA in the last 168 hours. Coagulation Profile: No results for input(s): INR, PROTIME in the last 168 hours. Cardiac Enzymes: No results for input(s): CKTOTAL, CKMB, CKMBINDEX, TROPONINI in the last 168 hours. BNP (last 3 results) No results for input(s): PROBNP in the last 8760 hours. HbA1C: No results for input(s): HGBA1C in the last 72 hours. CBG: No results for input(s): GLUCAP in the last 168 hours. Lipid Profile: No results for input(s): CHOL, HDL, LDLCALC, TRIG, CHOLHDL, LDLDIRECT in the last 72 hours. Thyroid Function Tests: Recent Labs    06/20/18 0842  TSH 1.245   Anemia Panel: No results for input(s): VITAMINB12, FOLATE,  FERRITIN, TIBC, IRON, RETICCTPCT in the last 72 hours. Sepsis Labs: No results for input(s): PROCALCITON, LATICACIDVEN in the last 168 hours.  Recent Results (from the past 240 hour(s))  MRSA PCR Screening     Status: None   Collection Time: 06/14/18  1:10 AM  Result Value Ref Range Status   MRSA by PCR NEGATIVE NEGATIVE Final    Comment:        The GeneXpert MRSA Assay (FDA approved for NASAL specimens only), is one component of  a comprehensive MRSA colonization surveillance program. It is not intended to diagnose MRSA infection nor to guide or monitor treatment for MRSA infections. Performed at [facility], [address] [place], [place] [address]   Surgical PCR screen     Status: Abnormal   Collection Time: 06/14/18  4:40 AM  Result Value Ref Range Status   MRSA, PCR NEGATIVE NEGATIVE Final   Staphylococcus aureus POSITIVE (A) NEGATIVE Final    Comment: (NOTE) The Xpert SA Assay (FDA approved for NASAL specimens in patients 1 years of age and older), is one component of a comprehensive surveillance program. It is not intended to diagnose infection nor to guide or monitor treatment. Performed at [facility], [address] [place], [place] [address]          Radiology Studies: Ct Cervical Spine Wo Contrast  Result Date: 06/21/2018 CLINICAL DATA:  Neck pain after surgery. EXAM: CT CERVICAL SPINE WITHOUT CONTRAST TECHNIQUE: Multidetector CT imaging of the cervical spine was performed without intravenous contrast. Multiplanar CT image reconstructions were also generated. COMPARISON:  MRI cervical spine 06/07/2018. FINDINGS: Alignment: Straightening of the normal cervical lordosis. 2 mm anterolisthesis C2-3. Skull base and vertebrae: Status post C4 corpectomy, with C3-C5 ACDF. Satisfactory C4 cage placement, as well as C3 through C5 plate and screws. Additional posterior decompression, C3-C5, followed by lateral mass screws BILATERAL C3-C5 and BILATERAL C2 pars screws, connected by rods. Satisfactory posterior hardware placement. Hardware appears intact. Autograft material is noted bilaterally. Soft tissues and spinal canal: Residual osseous spurring and OPLL, most notable on the LEFT at lower C3 and upper C4. Central OPLL prominent behind C5. Disc levels:  Spondylosis at C5-6. Upper chest: Unremarkable. Other: None IMPRESSION: Posterior cervical decompression with lateral mass fusion, C2  through C5, as described above. Some residual OPLL at C3, C4, and C5, but with good decompression of the spinal canal. Electronically Signed   By: Elsie Stain M.D.   On: 06/21/2018 12:41        Scheduled Meds: . dexamethasone  10 mg Intravenous Q6H  . febuxostat  40 mg Oral Daily  . gabapentin  300 mg Oral TID  . Influenza vac split quadrivalent PF  0.5 mL Intramuscular Tomorrow-1000  . methocarbamol  750 mg Oral QID  . midodrine  2.5 mg Oral TID WC  . pantoprazole  40 mg Oral BID  . polyethylene glycol  17 g Oral BID  . sodium chloride flush  3 mL Intravenous Q12H   Continuous Infusions: . sodium chloride 250 mL (06/16/18 1626)  . sodium chloride 50 mL/hr at 06/20/18 0400  . methocarbamol (ROBAXIN) IV 500 mg (06/10/18 1839)     LOS: 14 days    Time spent: 35    Delaine Lame, MD [facility]  If 7PM-7AM, please contact night-coverage [email or web address] Password [email or web address] 06/21/2018, 12:52 PM

## 2018-06-21 NOTE — Progress Notes (Addendum)
[name] [name]  Subjective/Complaints: Patient seen sitting up in bed.  Agitated and upset about his current condition and lack of answers.  ROS: Denies CP, S OB, nausea, vomiting, diarrhea.  Objective: Vital Signs: Blood pressure 129/79, pulse (!) 53, temperature 98.2 F (36.8 C), temperature source Oral, resp. rate 19, height 5' 9.02" (1.753 m), weight 123.8 kg, SpO2 96 %. Ct Cervical Spine Wo Contrast  Result Date: 06/21/2018 CLINICAL DATA:  Neck pain after surgery. EXAM: CT CERVICAL SPINE WITHOUT CONTRAST TECHNIQUE: Multidetector CT imaging of the cervical spine was performed without intravenous contrast. Multiplanar CT image reconstructions were also generated. COMPARISON:  MRI cervical spine 06/07/2018. FINDINGS: Alignment: Straightening of the normal cervical lordosis. 2 mm anterolisthesis C2-3. Skull base and vertebrae: Status post C4 corpectomy, with C3-C5 ACDF. Satisfactory C4 cage placement, as well as C3 through C5 plate and screws. Additional posterior decompression, C3-C5, followed by lateral mass screws BILATERAL C3-C5 and BILATERAL C2 pars screws, connected by rods. Satisfactory posterior hardware placement. Hardware appears intact. Autograft material is noted bilaterally. Soft tissues and spinal canal: Residual osseous spurring and OPLL, most notable on the LEFT at lower C3 and upper C4. Central OPLL prominent behind C5. Disc levels:  Spondylosis at C5-6. Upper chest: Unremarkable. Other: None IMPRESSION: Posterior cervical decompression with lateral mass fusion, C2 through C5, as described above. Some residual OPLL at C3, C4, and C5, but with good decompression of the spinal canal. Electronically Signed   By: Elsie Stain M.D.   On: 06/21/2018 12:41   Recent Labs    06/19/18 0328 06/20/18 0325  WBC 21.8* 20.9*  HGB 7.4* 7.6*  HCT 24.4* 25.3*  PLT 129* 116*   Recent Labs    06/19/18 0328 06/20/18 0325  NA 132* 134*  K 4.7  4.9  CL 97* 101  CO2 24 27  GLUCOSE 189* 128*  BUN 42* 34*  CREATININE 0.96 0.75  CALCIUM 9.0 9.1    Physical Exam: BP 129/79 (BP Location: Right Arm)   Pulse (!) 53   Temp 98.2 F (36.8 C) (Oral)   Resp 19   Ht 5' 9.02" (1.753 m)   Wt 123.8 kg   SpO2 96%   BMI 40.29 kg/m  Constitutional: No distress . Vital signs reviewed. HENT: Normocephalic.  Atraumatic. Neck: C-collar in place Eyes: EOMI. No discharge. Cardiovascular: RRR. No JVD. Respiratory: CTA Bilaterally. Normal effort. GI: BS +. Non-distended. Musc: Generalized edema and tenderness > right lower extremity Neurology: Alert and oriented x3 Upper extremity: 4/5 proximal distal Right upper extremity: Shoulder abduction 4/5, elbow flexion/extension 4-/5, handgrip 3+/5 Psych: Anxious.  Upset.   Assessment/Plan:  1.  Cervical myelopathy  Status post C3-5 ACDF on 7/19 followed by PC DF C3/5 on 10/2   Continue acute therapies with reduced intensity  Will hold on patient to CIR at present due to current medical issues.  Patient states that he would like his medical issues to be addressed before pursuing or participating in any type of therapy.  He also states that at his current level his parents house cannot accommodate a wheelchair his parents can only provide limited physical assistance  2.  Labile blood pressure  Symptoms suggestive of orthostatic hypotension  Orthostatic vital signs  Consider medications if necessary  Abdominal binder/TED hose  3.  Leukocytosis  WBCs 20.9 on 10/8  Likely steroid-induced  Afebrile  Monitor for signs of infection  4.  Acute blood loss anemia  Hemeglobin 7.6 on 10/8  Consider transfusion given blood pressure issues near syncopal episodes     Greater than 40 minutes spent with patient, with greater than 35 minutes in counseling regarding knee brace, blood pressure, rehab, general medical stay   LOS: 14 days A FACE TO FACE EVALUATION WAS PERFORMED  [name] [name] 06/21/2018, 4:19 PM

## 2018-06-21 NOTE — Progress Notes (Signed)
Physical Therapy Treatment Patient Details Name: [name] [name] MRN: [id number] DOB: 09-12-1966 Today's Date: 06/21/2018    History of Present Illness  52 y.o. male with recent ACDF at C3 C5 by Dr. Wynetta Emery in July 2019 complicated by pulmonary embolism was placed on apixaban subsequently discharged to rehab and then SNF.  Pt underwent posterior cervical decompressive laminectomy, foraminotomies, and cervical fusion C3,C4, C5 06/14/18.  PMH including gout, HTN, and pulmonary embolism. Pt presenting post fall after his legs gave away after getting out of the car. MRI of cervical and lumbar spine showing no change or infection per Dr. Wynetta Emery. MR of right knee showing complex tear of the posterior horn of the medial meniscus. Right knee aspirated on 06/09/18.     PT Comments    Pt still very frustrated with his status.  It has been difficult to get pt up on his feet for a sufficient amount of time to check BP.  Unable to get a reading while up in the [facility], though pt reports the dizziness that comes before starting to pass out.    Follow Up Recommendations  CIR     Equipment Recommendations  Other (comment)    Recommendations for Other Services       Precautions / Restrictions Precautions Precautions: Fall;Cervical Precaution Comments: reviewed cervical precautions  Other Brace/Splint: pt reports the Bledsoe is supposed to be on when not resting/sleeping in bed.    Mobility  Bed Mobility Overal bed mobility: Needs Assistance Bed Mobility: Rolling;Sit to Sidelying Rolling: Mod assist;+2 for physical assistance Sidelying to sit: Mod assist;+2 for physical assistance     Sit to sidelying: Max assist;+2 for physical assistance General bed mobility comments: truncal support with pt struggle to push up with R UE.  pt did use Ue's to scoot to EOB with intermittent min assist  Transfers Overall transfer level: Needs assistance   Transfers: Sit to/from Stand Sit to Stand: Mod assist;+2  physical assistance  Use of the STEDY to maximize support and improve pt's chances of attaining full upright posture.        General transfer comment: VC's for hand placement, bed pad to assist getting hip extended.  Ambulation/Gait                 Stairs             Wheelchair Mobility    Modified Rankin (Stroke Patients Only)       Balance Overall balance assessment: Needs assistance Sitting-balance support: Feet supported;Bilateral upper extremity supported Sitting balance-Leahy Scale: Fair Sitting balance - Comments: needs some UE assist to balance, no external assist Postural control: Posterior lean Standing balance support: Bilateral upper extremity supported Standing balance-Leahy Scale: Poor Standing balance comment: used steady standing frame and external assist.   pt unable to fully stand upright today, even with truncal assist of 2 persons.                            Cognition Arousal/Alertness: Awake/alert Behavior During Therapy: WFL for tasks assessed/performed Overall Cognitive Status: Within Functional Limits for tasks assessed                                        Exercises      General Comments General comments (skin integrity, edema, etc.): pt reported unable to stay upright because getting dizzy.  "I  only get dizzy when leaning forward and asked to move.  Not in the tilt bed."      Pertinent Vitals/Pain Pain Assessment: Faces Faces Pain Scale: Hurts whole lot Pain Location: neck  Pain Descriptors / Indicators: Grimacing;Guarding Pain Intervention(s): Monitored during session;Limited activity within patient's tolerance    Home Living                      Prior Function            PT Goals (current goals can now be found in the care plan section) Acute Rehab PT Goals Patient Stated Goal: to be able to walk again  PT Goal Formulation: With patient Time For Goal Achievement:  06/26/18 Potential to Achieve Goals: Good Progress towards PT goals: Progressing toward goals    Frequency    Min 5X/week      PT Plan Current plan remains appropriate;Frequency needs to be updated    Co-evaluation              AM-PAC PT "6 Clicks" Daily Activity  Outcome Measure  Difficulty turning over in bed (including adjusting bedclothes, sheets and blankets)?: Unable Difficulty moving from lying on back to sitting on the side of the bed? : Unable Difficulty sitting down on and standing up from a chair with arms (e.g., wheelchair, bedside commode, etc,.)?: Unable Help needed moving to and from a bed to chair (including a wheelchair)?: A Lot Help needed walking in hospital room?: Total Help needed climbing 3-5 steps with a railing? : Total 6 Click Score: 7    End of Session Equipment Utilized During Treatment: Gait belt;Cervical collar Activity Tolerance: Patient tolerated treatment well;Patient limited by fatigue Patient left: in bed;with call bell/phone within reach Nurse Communication: Mobility status;Need for lift equipment PT Visit Diagnosis: Other abnormalities of gait and mobility (R26.89);Other symptoms and signs involving the nervous system (R29.898);Muscle weakness (generalized) (M62.81)     Time: 1610-9604 PT Time Calculation (min) (ACUTE ONLY): 34 min  Charges:  $Therapeutic Activity: 23-37 mins                     06/21/2018  Avoca Bing, PT Acute Rehabilitation Services [phone]  (pager) [phone]  (office)   [name] Lalia Loudon 06/21/2018, 4:43 PM

## 2018-06-22 ENCOUNTER — Inpatient Hospital Stay (HOSPITAL_COMMUNITY): Admitting: Certified Registered Nurse Anesthetist

## 2018-06-22 ENCOUNTER — Inpatient Hospital Stay (HOSPITAL_COMMUNITY)

## 2018-06-22 ENCOUNTER — Inpatient Hospital Stay (HOSPITAL_COMMUNITY)
Admission: AD | Disposition: A | Discharge status: Rehabilitation Facility | Payer: Self-pay | Source: Other Acute Inpatient Hospital | Attending: Internal Medicine

## 2018-06-22 ENCOUNTER — Encounter (HOSPITAL_COMMUNITY): Payer: Self-pay | Admitting: *Deleted

## 2018-06-22 HISTORY — PX: POSTERIOR CERVICAL LAMINECTOMY: SHX2248

## 2018-06-22 LAB — COMPREHENSIVE METABOLIC PANEL WITH GFR
Alkaline Phosphatase: 58 U/L (ref 38–126)
Anion gap: 9 (ref 5–15)
GFR calc Af Amer: >60 mL/min (ref >60)

## 2018-06-22 LAB — COMPREHENSIVE METABOLIC PANEL
ALT: 21 U/L (ref 0–44)
AST: 16 U/L (ref 15–41)
Albumin: 2.8 g/dL — ABNORMAL LOW (ref 3.5–5.0)
BUN: 28 mg/dL — ABNORMAL HIGH (ref 6–20)
CO2: 24 mmol/L (ref 22–32)
Calcium: 9.3 mg/dL (ref 8.9–10.3)
Chloride: 102 mmol/L (ref 98–111)
Creatinine, Ser: 0.81 mg/dL (ref 0.61–1.24)
GFR calc non Af Amer: >60 mL/min (ref >60)
Glucose, Bld: 174 mg/dL — ABNORMAL HIGH (ref 70–99)
Potassium: 4.9 mmol/L (ref 3.5–5.1)
Sodium: 135 mmol/L (ref 135–145)
Total Bilirubin: 0.6 mg/dL (ref 0.3–1.2)
Total Protein: 5.7 g/dL — ABNORMAL LOW (ref 6.5–8.1)

## 2018-06-22 LAB — PROTIME-INR
INR: 1.29
Prothrombin Time: 15.9 seconds — ABNORMAL HIGH (ref 11.4–15.2)

## 2018-06-22 LAB — CBC
HCT: 27.7 % — ABNORMAL LOW (ref 39.0–52.0)
HCT: 28.5 % — ABNORMAL LOW (ref 39.0–52.0)
HEMOGLOBIN: 8.2 g/dL — AB (ref 13.0–17.0)
Hemoglobin: 8.5 g/dL — ABNORMAL LOW (ref 13.0–17.0)
MCH: 28.6 pg (ref 26.0–34.0)
MCH: 27.2 pg (ref 26.0–34.0)
MCHC: 30.7 g/dL (ref 30.0–36.0)
MCHC: 28.8 g/dL — AB (ref 30.0–36.0)
MCV: 93.3 fL (ref 80.0–100.0)
MCV: 94.4 fL (ref 80.0–100.0)
PLATELETS: 109 10*3/uL — AB (ref 150–400)
Platelets: 122 10*3/uL — ABNORMAL LOW (ref 150–400)
RBC: 2.97 MIL/uL — ABNORMAL LOW (ref 4.22–5.81)
RBC: 3.02 MIL/uL — ABNORMAL LOW (ref 4.22–5.81)
RDW: 17.2 % — ABNORMAL HIGH (ref 11.5–15.5)
RDW: 17.4 % — ABNORMAL HIGH (ref 11.5–15.5)
WBC: 25.3 10*3/uL — ABNORMAL HIGH (ref 4.0–10.5)
WBC: 19 10*3/uL — ABNORMAL HIGH (ref 4.0–10.5)
nRBC: 0.4 % — ABNORMAL HIGH (ref 0.0–0.2)
nRBC: 0.4 % — ABNORMAL HIGH (ref 0.0–0.2)

## 2018-06-22 LAB — APTT: aPTT: 29 seconds (ref 24–36)

## 2018-06-22 LAB — GLUCOSE, CAPILLARY
GLUCOSE-CAPILLARY: 121 mg/dL — AB (ref 70–99)
Glucose-Capillary: 153 mg/dL — ABNORMAL HIGH (ref 70–99)
Glucose-Capillary: 166 mg/dL — ABNORMAL HIGH (ref 70–99)
Glucose-Capillary: 105 mg/dL — ABNORMAL HIGH (ref 70–99)
Glucose-Capillary: 134 mg/dL — ABNORMAL HIGH (ref 70–99)

## 2018-06-22 LAB — MAGNESIUM: Magnesium: 2.2 mg/dL (ref 1.7–2.4)

## 2018-06-22 SURGERY — POSTERIOR CERVICAL LAMINECTOMY
Anesthesia: General | Site: Spine Cervical

## 2018-06-22 MED ORDER — PROPOFOL 10 MG/ML IV BOLUS
INTRAVENOUS | Status: AC
  Filled 2018-06-22: qty 20

## 2018-06-22 MED ORDER — MIDAZOLAM HCL 5 MG/5ML IJ SOLN
INTRAMUSCULAR | Status: DC | PRN
  Administered 2018-06-22: 2 mg via INTRAVENOUS

## 2018-06-22 MED ORDER — SODIUM CHLORIDE 0.9% FLUSH
3.0000 mL | Freq: Two times a day (BID) | INTRAVENOUS | Status: DC
  Administered 2018-06-23 – 2018-06-29 (×5): 3 mL via INTRAVENOUS

## 2018-06-22 MED ORDER — SODIUM CHLORIDE 0.9% FLUSH
3.0000 mL | INTRAVENOUS | Status: DC | PRN
  Administered 2018-06-29 – 2018-09-06 (×9): 3 mL via INTRAVENOUS
  Filled 2018-06-22 (×9): qty 3

## 2018-06-22 MED ORDER — EPHEDRINE SULFATE-NACL 50-0.9 MG/10ML-% IV SOSY
PREFILLED_SYRINGE | INTRAVENOUS | Status: DC | PRN
  Administered 2018-06-22: 5 mg via INTRAVENOUS

## 2018-06-22 MED ORDER — ACETAMINOPHEN 325 MG PO TABS
650.0000 mg | ORAL_TABLET | ORAL | Status: DC | PRN
  Administered 2018-06-26 – 2018-09-01 (×16): 650 mg via ORAL
  Filled 2018-06-22 (×20): qty 2

## 2018-06-22 MED ORDER — ONDANSETRON HCL 4 MG/2ML IJ SOLN
INTRAMUSCULAR | Status: AC
  Filled 2018-06-22: qty 2

## 2018-06-22 MED ORDER — SUGAMMADEX SODIUM 500 MG/5ML IV SOLN
INTRAVENOUS | Status: AC
  Filled 2018-06-22: qty 5

## 2018-06-22 MED ORDER — PROMETHAZINE HCL 25 MG/ML IJ SOLN: 6.2500 mg | INTRAMUSCULAR | Status: DC | PRN

## 2018-06-22 MED ORDER — SODIUM CHLORIDE 0.9% IV SOLUTION
Freq: Once | INTRAVENOUS | Status: AC
  Administered 2018-06-23: 09:00:00 via INTRAVENOUS

## 2018-06-22 MED ORDER — PHENYLEPHRINE 40 MCG/ML (10ML) SYRINGE FOR IV PUSH (FOR BLOOD PRESSURE SUPPORT)
PREFILLED_SYRINGE | INTRAVENOUS | Status: DC | PRN
  Administered 2018-06-22 (×2): 80 ug via INTRAVENOUS

## 2018-06-22 MED ORDER — ONDANSETRON HCL 4 MG PO TABS: 4.0000 mg | ORAL_TABLET | Freq: Four times a day (QID) | ORAL | Status: DC | PRN

## 2018-06-22 MED ORDER — FENTANYL CITRATE (PF) 250 MCG/5ML IJ SOLN
INTRAMUSCULAR | Status: DC | PRN
  Administered 2018-06-22: 50 ug via INTRAVENOUS

## 2018-06-22 MED ORDER — ONDANSETRON HCL 4 MG/2ML IJ SOLN
4.0000 mg | Freq: Four times a day (QID) | INTRAMUSCULAR | Status: DC | PRN
  Filled 2018-06-22: qty 2

## 2018-06-22 MED ORDER — LIDOCAINE-EPINEPHRINE 1 %-1:100000 IJ SOLN
INTRAMUSCULAR | Status: AC
  Filled 2018-06-22: qty 1

## 2018-06-22 MED ORDER — DEXTROSE 5 % IV SOLN
3.0000 g | Freq: Once | INTRAVENOUS | Status: DC
  Filled 2018-06-22: qty 3000

## 2018-06-22 MED ORDER — MEPERIDINE HCL 50 MG/ML IJ SOLN: 6.2500 mg | INTRAMUSCULAR | Status: DC | PRN

## 2018-06-22 MED ORDER — SUGAMMADEX SODIUM 200 MG/2ML IV SOLN
INTRAVENOUS | Status: DC | PRN
  Administered 2018-06-22: 300 mg via INTRAVENOUS

## 2018-06-22 MED ORDER — THROMBIN 5000 UNITS EX SOLR
CUTANEOUS | Status: AC
  Filled 2018-06-22: qty 5000

## 2018-06-22 MED ORDER — HYDROMORPHONE HCL 1 MG/ML IJ SOLN
0.2500 mg | INTRAMUSCULAR | Status: DC | PRN
  Administered 2018-06-22: 0.5 mg via INTRAVENOUS

## 2018-06-22 MED ORDER — HYDROMORPHONE HCL 1 MG/ML IJ SOLN
INTRAMUSCULAR | Status: AC
  Filled 2018-06-22: qty 1

## 2018-06-22 MED ORDER — MENTHOL 3 MG MT LOZG
1.0000 | LOZENGE | OROMUCOSAL | Status: DC | PRN
  Filled 2018-06-22: qty 9

## 2018-06-22 MED ORDER — SODIUM CHLORIDE 0.9 % IV SOLN
INTRAVENOUS | Status: DC | PRN
  Administered 2018-06-22: 25 ug/min via INTRAVENOUS

## 2018-06-22 MED ORDER — EPHEDRINE 5 MG/ML INJ
INTRAVENOUS | Status: AC
  Filled 2018-06-22: qty 10

## 2018-06-22 MED ORDER — ALPRAZOLAM 0.5 MG PO TABS
1.0000 mg | ORAL_TABLET | Freq: Two times a day (BID) | ORAL | Status: DC | PRN
  Administered 2018-06-23 – 2018-07-14 (×23): 1 mg via ORAL
  Filled 2018-06-22 (×24): qty 2

## 2018-06-22 MED ORDER — HEMOSTATIC AGENTS (NO CHARGE) OPTIME
TOPICAL | Status: DC | PRN
  Administered 2018-06-22: 1 via TOPICAL

## 2018-06-22 MED ORDER — DEXAMETHASONE SODIUM PHOSPHATE 10 MG/ML IJ SOLN
INTRAMUSCULAR | Status: AC
  Filled 2018-06-22: qty 1

## 2018-06-22 MED ORDER — BACITRACIN ZINC 500 UNIT/GM EX OINT
TOPICAL_OINTMENT | CUTANEOUS | Status: DC | PRN
  Administered 2018-06-22: 1 via TOPICAL

## 2018-06-22 MED ORDER — PROPOFOL 10 MG/ML IV BOLUS
INTRAVENOUS | Status: DC | PRN
  Administered 2018-06-22: 60 mg via INTRAVENOUS
  Administered 2018-06-22: 200 mg via INTRAVENOUS

## 2018-06-22 MED ORDER — PHENOL 1.4 % MT LIQD
1.0000 | OROMUCOSAL | Status: DC | PRN
  Administered 2018-06-30: 1 via OROMUCOSAL
  Filled 2018-06-22: qty 177

## 2018-06-22 MED ORDER — LIDOCAINE 2% (20 MG/ML) 5 ML SYRINGE
INTRAMUSCULAR | Status: AC
  Filled 2018-06-22: qty 5

## 2018-06-22 MED ORDER — PHENYLEPHRINE 40 MCG/ML (10ML) SYRINGE FOR IV PUSH (FOR BLOOD PRESSURE SUPPORT)
PREFILLED_SYRINGE | INTRAVENOUS | Status: AC
  Filled 2018-06-22: qty 10

## 2018-06-22 MED ORDER — SODIUM CHLORIDE 0.9 % IV SOLN
INTRAVENOUS | Status: DC | PRN
  Administered 2018-06-22: 18:00:00

## 2018-06-22 MED ORDER — THROMBIN 5000 UNITS EX SOLR
CUTANEOUS | Status: DC | PRN
  Administered 2018-06-22 (×2): 5000 [IU] via TOPICAL

## 2018-06-22 MED ORDER — LIDOCAINE 2% (20 MG/ML) 5 ML SYRINGE
INTRAMUSCULAR | Status: DC | PRN
  Administered 2018-06-22: 60 mg via INTRAVENOUS

## 2018-06-22 MED ORDER — ACETAMINOPHEN 650 MG RE SUPP
650.0000 mg | RECTAL | Status: DC | PRN
  Administered 2018-08-07: 650 mg via RECTAL
  Filled 2018-06-22: qty 1

## 2018-06-22 MED ORDER — BUPIVACAINE HCL (PF) 0.25 % IJ SOLN
INTRAMUSCULAR | Status: AC
  Filled 2018-06-22: qty 30

## 2018-06-22 MED ORDER — THROMBIN (RECOMBINANT) 5000 UNITS EX SOLR
CUTANEOUS | Status: AC
  Filled 2018-06-22: qty 10000

## 2018-06-22 MED ORDER — CEFAZOLIN SODIUM 1 G IJ SOLR
INTRAMUSCULAR | Status: AC
  Filled 2018-06-22: qty 30

## 2018-06-22 MED ORDER — CYCLOBENZAPRINE HCL 10 MG PO TABS
10.0000 mg | ORAL_TABLET | Freq: Three times a day (TID) | ORAL | Status: DC | PRN
  Administered 2018-06-24 – 2018-07-14 (×17): 10 mg via ORAL
  Filled 2018-06-22 (×19): qty 1

## 2018-06-22 MED ORDER — SUCCINYLCHOLINE CHLORIDE 200 MG/10ML IV SOSY
PREFILLED_SYRINGE | INTRAVENOUS | Status: AC
  Filled 2018-06-22: qty 10

## 2018-06-22 MED ORDER — INSULIN ASPART 100 UNIT/ML ~~LOC~~ SOLN
0.0000 [IU] | Freq: Three times a day (TID) | SUBCUTANEOUS | Status: DC
  Administered 2018-06-22 – 2018-06-23 (×2): 2 [IU] via SUBCUTANEOUS
  Administered 2018-06-23 (×2): 1 [IU] via SUBCUTANEOUS
  Administered 2018-06-24: 2 [IU] via SUBCUTANEOUS
  Administered 2018-06-24: 1 [IU] via SUBCUTANEOUS
  Administered 2018-06-25: 2 [IU] via SUBCUTANEOUS
  Administered 2018-06-25: 3 [IU] via SUBCUTANEOUS
  Administered 2018-06-25: 2 [IU] via SUBCUTANEOUS
  Administered 2018-06-26 (×2): 3 [IU] via SUBCUTANEOUS
  Administered 2018-06-26 – 2018-06-27 (×2): 2 [IU] via SUBCUTANEOUS
  Administered 2018-06-27: 1 [IU] via SUBCUTANEOUS
  Administered 2018-06-27 – 2018-06-29 (×4): 2 [IU] via SUBCUTANEOUS
  Administered 2018-06-29: 3 [IU] via SUBCUTANEOUS
  Administered 2018-06-29 – 2018-06-30 (×4): 2 [IU] via SUBCUTANEOUS
  Administered 2018-07-02 (×3): 3 [IU] via SUBCUTANEOUS
  Administered 2018-07-03: 5 [IU] via SUBCUTANEOUS
  Administered 2018-07-03 (×2): 3 [IU] via SUBCUTANEOUS
  Administered 2018-07-04: 2 [IU] via SUBCUTANEOUS
  Administered 2018-07-04 – 2018-07-05 (×3): 3 [IU] via SUBCUTANEOUS
  Administered 2018-07-05 – 2018-07-06 (×3): 2 [IU] via SUBCUTANEOUS
  Administered 2018-07-06: 3 [IU] via SUBCUTANEOUS
  Administered 2018-07-06: 2 [IU] via SUBCUTANEOUS
  Administered 2018-07-07: 3 [IU] via SUBCUTANEOUS
  Administered 2018-07-07: 4 [IU] via SUBCUTANEOUS
  Administered 2018-07-07: 3 [IU] via SUBCUTANEOUS
  Administered 2018-07-08 (×2): 5 [IU] via SUBCUTANEOUS
  Administered 2018-07-08: 3 [IU] via SUBCUTANEOUS
  Administered 2018-07-09 (×2): 5 [IU] via SUBCUTANEOUS
  Administered 2018-07-09: 7 [IU] via SUBCUTANEOUS
  Administered 2018-07-10: 3 [IU] via SUBCUTANEOUS
  Administered 2018-07-10: 5 [IU] via SUBCUTANEOUS
  Administered 2018-07-10 – 2018-07-11 (×2): 3 [IU] via SUBCUTANEOUS
  Administered 2018-07-11 (×2): 7 [IU] via SUBCUTANEOUS
  Administered 2018-07-12: 3 [IU] via SUBCUTANEOUS
  Administered 2018-07-12 – 2018-07-14 (×7): 5 [IU] via SUBCUTANEOUS
  Administered 2018-07-15: 2 [IU] via SUBCUTANEOUS
  Administered 2018-07-15: 3 [IU] via SUBCUTANEOUS
  Administered 2018-07-15: 2 [IU] via SUBCUTANEOUS
  Administered 2018-07-16: 7 [IU] via SUBCUTANEOUS
  Administered 2018-07-16: 3 [IU] via SUBCUTANEOUS
  Administered 2018-07-16: 5 [IU] via SUBCUTANEOUS
  Administered 2018-07-17 (×2): 2 [IU] via SUBCUTANEOUS
  Administered 2018-07-17: 1 [IU] via SUBCUTANEOUS
  Administered 2018-07-18 (×2): 5 [IU] via SUBCUTANEOUS
  Administered 2018-07-18 – 2018-07-19 (×2): 7 [IU] via SUBCUTANEOUS
  Administered 2018-07-19: 3 [IU] via SUBCUTANEOUS
  Administered 2018-07-19: 9 [IU] via SUBCUTANEOUS
  Administered 2018-07-20: 3 [IU] via SUBCUTANEOUS
  Administered 2018-07-20: 5 [IU] via SUBCUTANEOUS

## 2018-06-22 MED ORDER — ALBUMIN HUMAN 25 % IV SOLN
50.0000 g | Freq: Once | INTRAVENOUS | Status: AC
  Administered 2018-06-22: 12.5 g via INTRAVENOUS
  Filled 2018-06-22: qty 200

## 2018-06-22 MED ORDER — THROMBIN 5000 UNITS EX SOLR
OROMUCOSAL | Status: DC | PRN
  Administered 2018-06-22 (×2): via TOPICAL

## 2018-06-22 MED ORDER — CEFAZOLIN SODIUM-DEXTROSE 1-4 GM/50ML-% IV SOLN
INTRAVENOUS | Status: DC | PRN
  Administered 2018-06-22: 3 g via INTRAVENOUS

## 2018-06-22 MED ORDER — ROCURONIUM BROMIDE 50 MG/5ML IV SOSY
PREFILLED_SYRINGE | INTRAVENOUS | Status: AC
  Filled 2018-06-22: qty 5

## 2018-06-22 MED ORDER — INSULIN ASPART 100 UNIT/ML ~~LOC~~ SOLN
0.0000 [IU] | Freq: Every day | SUBCUTANEOUS | Status: DC
  Administered 2018-07-02: 2 [IU] via SUBCUTANEOUS
  Administered 2018-07-04 – 2018-07-05 (×2): 3 [IU] via SUBCUTANEOUS
  Administered 2018-07-06: 2 [IU] via SUBCUTANEOUS
  Administered 2018-07-07: 3 [IU] via SUBCUTANEOUS
  Administered 2018-07-08: 2 [IU] via SUBCUTANEOUS
  Administered 2018-07-09: 3 [IU] via SUBCUTANEOUS
  Administered 2018-07-11: 5 [IU] via SUBCUTANEOUS
  Administered 2018-07-12 – 2018-07-13 (×2): 4 [IU] via SUBCUTANEOUS
  Administered 2018-07-14 – 2018-07-15 (×2): 2 [IU] via SUBCUTANEOUS
  Administered 2018-07-16 – 2018-07-18 (×2): 3 [IU] via SUBCUTANEOUS
  Administered 2018-07-19: 4 [IU] via SUBCUTANEOUS
  Administered 2018-07-20: 2 [IU] via SUBCUTANEOUS
  Administered 2018-07-21 – 2018-07-23 (×3): 3 [IU] via SUBCUTANEOUS
  Administered 2018-07-25: 2 [IU] via SUBCUTANEOUS
  Administered 2018-07-27: 3 [IU] via SUBCUTANEOUS

## 2018-06-22 MED ORDER — ROCURONIUM BROMIDE 10 MG/ML (PF) SYRINGE
PREFILLED_SYRINGE | INTRAVENOUS | Status: DC | PRN
  Administered 2018-06-22: 10 mg via INTRAVENOUS
  Administered 2018-06-22: 50 mg via INTRAVENOUS

## 2018-06-22 MED ORDER — MIDAZOLAM HCL 2 MG/2ML IJ SOLN
INTRAMUSCULAR | Status: AC
  Filled 2018-06-22: qty 2

## 2018-06-22 MED ORDER — SUCCINYLCHOLINE CHLORIDE 200 MG/10ML IV SOSY
PREFILLED_SYRINGE | INTRAVENOUS | Status: DC | PRN
  Administered 2018-06-22: 200 mg via INTRAVENOUS

## 2018-06-22 MED ORDER — PANTOPRAZOLE SODIUM 40 MG PO TBEC
40.0000 mg | DELAYED_RELEASE_TABLET | Freq: Every day | ORAL | Status: DC
  Administered 2018-07-01: 40 mg via ORAL
  Filled 2018-06-22 (×5): qty 1

## 2018-06-22 MED ORDER — SODIUM CHLORIDE 0.9 % IJ SOLN
INTRAMUSCULAR | Status: AC
  Filled 2018-06-22: qty 10

## 2018-06-22 MED ORDER — ONDANSETRON HCL 4 MG/2ML IJ SOLN
INTRAMUSCULAR | Status: DC | PRN
  Administered 2018-06-22: 4 mg via INTRAVENOUS

## 2018-06-22 MED ORDER — LACTATED RINGERS IV SOLN: INTRAVENOUS | Status: DC

## 2018-06-22 MED ORDER — CEFAZOLIN SODIUM-DEXTROSE 2-4 GM/100ML-% IV SOLN
2.0000 g | Freq: Three times a day (TID) | INTRAVENOUS | Status: AC
  Administered 2018-06-23 – 2018-06-27 (×15): 2 g via INTRAVENOUS
  Filled 2018-06-22 (×14): qty 100

## 2018-06-22 MED ORDER — BACITRACIN ZINC 500 UNIT/GM EX OINT
TOPICAL_OINTMENT | CUTANEOUS | Status: AC
  Filled 2018-06-22: qty 28.35

## 2018-06-22 MED ORDER — ALUM & MAG HYDROXIDE-SIMETH 200-200-20 MG/5ML PO SUSP
30.0000 mL | Freq: Four times a day (QID) | ORAL | Status: DC | PRN
  Administered 2018-06-26: 30 mL via ORAL
  Filled 2018-06-22 (×2): qty 30

## 2018-06-22 MED ORDER — ALPRAZOLAM 0.5 MG PO TABS
0.5000 mg | ORAL_TABLET | Freq: Two times a day (BID) | ORAL | Status: DC | PRN
  Administered 2018-06-22: 0.5 mg via ORAL
  Filled 2018-06-22: qty 1

## 2018-06-22 MED ORDER — FENTANYL CITRATE (PF) 250 MCG/5ML IJ SOLN
INTRAMUSCULAR | Status: AC
  Filled 2018-06-22: qty 5

## 2018-06-22 MED ORDER — SODIUM CHLORIDE 0.9 % IV SOLN
250.0000 mL | INTRAVENOUS | Status: DC
  Administered 2018-06-23 (×2): 250 mL via INTRAVENOUS

## 2018-06-22 MED ORDER — LACTATED RINGERS IV SOLN
INTRAVENOUS | Status: DC
  Administered 2018-06-22 – 2018-07-07 (×5): via INTRAVENOUS

## 2018-06-22 SURGICAL SUPPLY — 60 items
BAG DECANTER FOR FLEXI CONT (MISCELLANEOUS) ×2 IMPLANT
BENZOIN TINCTURE PRP APPL 2/3 (GAUZE/BANDAGES/DRESSINGS) ×2 IMPLANT
BLADE SURG 11 STRL SS (BLADE) ×2 IMPLANT
CANISTER SUCT 3000ML PPV (MISCELLANEOUS) ×2 IMPLANT
CARTRIDGE OIL MAESTRO DRILL (MISCELLANEOUS) ×1 IMPLANT
COVER WAND RF STERILE (DRAPES) ×2 IMPLANT
DECANTER SPIKE VIAL GLASS SM (MISCELLANEOUS) ×2 IMPLANT
DERMABOND ADVANCED (GAUZE/BANDAGES/DRESSINGS) ×1
DERMABOND ADVANCED .7 DNX12 (GAUZE/BANDAGES/DRESSINGS) ×1 IMPLANT
DIFFUSER DRILL AIR PNEUMATIC (MISCELLANEOUS) ×2 IMPLANT
DRAPE C-ARM 42X72 X-RAY (DRAPES) ×4 IMPLANT
DRAPE LAPAROTOMY 100X72 PEDS (DRAPES) ×2 IMPLANT
DRAPE MICROSCOPE LEICA (MISCELLANEOUS) IMPLANT
DRAPE SURG 17X23 STRL (DRAPES) ×2 IMPLANT
DRSG OPSITE POSTOP 4X8 (GAUZE/BANDAGES/DRESSINGS) ×2 IMPLANT
DURAPREP 26ML APPLICATOR (WOUND CARE) ×2 IMPLANT
ELECT REM PT RETURN 9FT ADLT (ELECTROSURGICAL) ×2
ELECTRODE REM PT RTRN 9FT ADLT (ELECTROSURGICAL) ×1 IMPLANT
EVACUATOR 3/16  PVC DRAIN (DRAIN) ×1
EVACUATOR 3/16 PVC DRAIN (DRAIN) ×1 IMPLANT
GAUZE 4X4 16PLY RFD (DISPOSABLE) IMPLANT
GAUZE SPONGE 4X4 12PLY STRL (GAUZE/BANDAGES/DRESSINGS) ×2 IMPLANT
GLOVE BIO SURGEON STRL SZ7 (GLOVE) IMPLANT
GLOVE BIO SURGEON STRL SZ8 (GLOVE) ×2 IMPLANT
GLOVE BIOGEL PI IND STRL 7.0 (GLOVE) IMPLANT
GLOVE BIOGEL PI INDICATOR 7.0 (GLOVE)
GLOVE EXAM NITRILE LRG STRL (GLOVE) IMPLANT
GLOVE EXAM NITRILE XL STR (GLOVE) IMPLANT
GLOVE EXAM NITRILE XS STR PU (GLOVE) IMPLANT
GLOVE INDICATOR 8.5 STRL (GLOVE) ×2 IMPLANT
GOWN STRL REUS W/ TWL LRG LVL3 (GOWN DISPOSABLE) IMPLANT
GOWN STRL REUS W/ TWL XL LVL3 (GOWN DISPOSABLE) ×1 IMPLANT
GOWN STRL REUS W/TWL 2XL LVL3 (GOWN DISPOSABLE) IMPLANT
GOWN STRL REUS W/TWL LRG LVL3 (GOWN DISPOSABLE)
GOWN STRL REUS W/TWL XL LVL3 (GOWN DISPOSABLE) ×1
HEMOSTAT POWDER KIT SURGIFOAM (HEMOSTASIS) ×4 IMPLANT
KIT BASIN OR (CUSTOM PROCEDURE TRAY) ×2 IMPLANT
KIT TURNOVER KIT B (KITS) ×2 IMPLANT
MARKER SKIN DUAL TIP RULER LAB (MISCELLANEOUS) ×2 IMPLANT
NEEDLE HYPO 25X1 1.5 SAFETY (NEEDLE) ×2 IMPLANT
NEEDLE SPNL 20GX3.5 QUINCKE YW (NEEDLE) ×2 IMPLANT
NS IRRIG 1000ML POUR BTL (IV SOLUTION) ×2 IMPLANT
OIL CARTRIDGE MAESTRO DRILL (MISCELLANEOUS) ×2
PACK LAMINECTOMY NEURO (CUSTOM PROCEDURE TRAY) ×2 IMPLANT
PAD ARMBOARD 7.5X6 YLW CONV (MISCELLANEOUS) ×6 IMPLANT
PIN MAYFIELD SKULL DISP (PIN) ×2 IMPLANT
RUBBERBAND STERILE (MISCELLANEOUS) IMPLANT
SPONGE LAP 4X18 RFD (DISPOSABLE) IMPLANT
SPONGE SURGIFOAM ABS GEL SZ50 (HEMOSTASIS) ×2 IMPLANT
STRIP CLOSURE SKIN 1/2X4 (GAUZE/BANDAGES/DRESSINGS) ×2 IMPLANT
SUT ETHILON 3 0 FSL (SUTURE) ×2 IMPLANT
SUT ETHILON 4 0 PS 2 18 (SUTURE) IMPLANT
SUT VIC AB 0 CT1 18XCR BRD8 (SUTURE) ×1 IMPLANT
SUT VIC AB 0 CT1 8-18 (SUTURE) ×1
SUT VIC AB 2-0 CT1 18 (SUTURE) ×4 IMPLANT
SUT VICRYL 4-0 PS2 18IN ABS (SUTURE) ×2 IMPLANT
TOWEL GREEN STERILE (TOWEL DISPOSABLE) ×2 IMPLANT
TOWEL GREEN STERILE FF (TOWEL DISPOSABLE) ×2 IMPLANT
TRAY FOLEY MTR SLVR 16FR STAT (SET/KITS/TRAYS/PACK) IMPLANT
WATER STERILE IRR 1000ML POUR (IV SOLUTION) ×2 IMPLANT

## 2018-06-22 NOTE — Progress Notes (Signed)
Called to MRI regarding STAT MRI ordered; per staff several anesthesia cases ahead at this time, looking at a few hours at least.

## 2018-06-22 NOTE — Anesthesia Preprocedure Evaluation (Addendum)
Anesthesia Evaluation  Patient identified by MRN, date of birth, ID band Patient awake    Reviewed: Allergy & Precautions, NPO status , Patient's Chart, lab work & pertinent test results, reviewed documented beta blocker date and time   History of Anesthesia Complications Negative for: history of anesthetic complications  Airway Mallampati: III  TM Distance: >3 FB Neck ROM: Limited   Comment: Expanding neck hematoma Dental  (+) Teeth Intact, Dental Advisory Given   Pulmonary shortness of breath, PE   breath sounds clear to auscultation       Cardiovascular hypertension, + DVT (03/27/18 Bilateral lower extremities are positive for acute deep vein thrombosis involving bilateral posterior tibial and peroneal veins)   Rhythm:Regular Rate:Normal  03/27/18 ECHO: EF 50-55%, valves OK   Neuro/Psych PSYCHIATRIC DISORDERS Anxiety S/p Anterior cervical corpectomy 03/24/18    GI/Hepatic negative GI ROS, Neg liver ROS,   Endo/Other  Morbid obesity  Renal/GU negative Renal ROS     Musculoskeletal  (+) Arthritis ,   Abdominal (+) + obese,   Peds  Hematology  (+) anemia , plt 106k Heparin infusion stopped   Anesthesia Other Findings   Reproductive/Obstetrics          Anesthesia Plan  ASA: III  Anesthesia Plan: General   Post-op Pain Management:    Induction: Intravenous  PONV Risk Score and Plan: 3 and Ondansetron, Dexamethasone and Midazolam  Airway Management Planned: Oral ETT and Video Laryngoscope Planned  Additional Equipment: None  Intra-op Plan:   Post-operative Plan: Extubation in OR  Informed Consent:   Dental advisory given  Plan Discussed with: CRNA  Anesthesia Plan Comments:        Anesthesia Quick Evaluation

## 2018-06-22 NOTE — Progress Notes (Signed)
PROGRESS NOTE    [name] [name]  ZHY:[id number] DOB: 01/05/1966 DOA: 06/06/2018 PCP: Devra Dopp, MD    Brief Narrative: 52 year old man with past medical history relevant for gout, hypertension anterior cervical corpectomy of C4 on 03/24/2018 with comp gated postoperative course including development of multiple PEs and DVTs started on anticoagulation and subsequently developed hematoma requiring reexploration on 03/28/2018 status post placement of IVC filter who was discharged to skilled nursing facility and had a complicated course including progressive lower extremity weakness, intermittent fevers of unclear etiology and hospitalizations for "pneumonia".  Patient was admitted to this hospital is a transfer from [facility] with MRI of C-spine that showed postoperative changes and cord compression with cord edema and concern for ischemia.  Patient is status post posterior cervical decompression laminectomy of C3, C4, C5, removal of spinous processes, foraminotomies on 06/14/2018.  His postoperative course has been complicated by recurrent fevers, migratory arthralgias/arthritis that are thought to be rheumatologic in nature as well as significant orthostasis limiting the amount he can work with physical therapy.   Assessment & Plan:   Principal Problem:   Fever Active Problems:   Myelopathy (HCC)   Benign essential HTN   Cord compression (HCC)   History of pulmonary embolism   Spondylosis, cervical, with myelopathy   Labile blood pressure   Orthostatic hypotension   Leukocytosis   #) Orthostatic hypotension: Unclear etiology.  Differential diagnosis includes dysautonomia from an autoimmune or inflammatory source, central dysautonomia from his surgeries, deconditioning due to laying in bed or some combination of all 3. 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  His inflammatory markers were dramatically elevated.  He did have arthrocentesis performed on 06/09/2018 that was unremarkable.  Per review of the chart ambulatory rheumatology was spoken to and recommended steroids which patient is on for his cervical spinal stenosis and surgery. - Rheumatologic work-up currently is unremarkable other than mildly elevated CCP  #) Status post cervical decompressive surgery: Today patient is complaining of now new left-sided weakness particularly in the large muscles of the shoulder with abduction of the arm as well as some weakness on flexion and extension of the arm.  He additionally reports inability to fully extend his wrist -We will discuss with neurosurgery about this new upper extremity weakness on the left -Neurosurgery following, appreciate recommendations -Continue IV dexamethasone, will defer dosing to neurosurgery -Sliding scale insulin started while on high-dose steroids  #) Lower extremity edema/grade 2 diastolic dysfunction: Noted on echo on 03/27/2018 -Judicious fluid use -Albumin low, will give 50 g  #) Perioperative DVT/PE: Patient is currently not on anticoagulation due to bleeding.  An IVC filter was placed on 03/28/2018 after reexploration of the surgical site due to hematoma was needed.  #) Pain/psych: -Continue gabapentin 300 mg 3 times daily  Fluids: Tolerating p.o. Elect lites: Monitor and supplement Nutrition: Regular diet  Prophylaxis: SCDs  Disposition: Pending ability to tolerate physical therapy with less autonomic instability  Full code  Consultants:   Neurosurgery [facility] Orthopedic surgery Infectious disease  Procedures:  On 10/2 1. posterior cervical decompressive laminectomy at C3, C4, C5 with removal of the spinous process and complete laminectomy and  foraminotomies of the C3, C4, C5 nerve roots. 2. Posterior cervical fusion with lateral mass screws at C3-C4-C5  and pars screws at C2 lies in the globus ellipseLateral mass screw system 3. Posterior lateral arthrodesis C2-C5 utilizing locally harvested autograft mixed with vivigen  Antimicrobials:   None   Subjective: Patient this morning is extremely anxious.  He is fixated on the fact that his blood sugars were in the 150s and that he received insulin.  He reports being tremulous.  He also is quite panic dizzy and now has left upper extremity weakness that was apparently new since yesterday.  He does not have any numbness or tingling down either arms.  He has fairly intact grip strength but weakness of the larger muscles of the left arm and shoulder  Objective: Vitals:   06/21/18 2300 06/21/18 2351 06/22/18 0300 06/22/18 0740  BP:  (!) 145/90 133/87 (!) 140/98  Pulse:  (!) 54  77  Resp:  (!) 21 19 17   Temp: 97.8 F (36.6 C)  97.8 F (36.6 C) 97.8 F (36.6 C)  TempSrc: Oral  Oral Oral  SpO2:  100% 98% 100%  Weight:      Height:        Intake/Output Summary (Last 24 hours) at 06/22/2018 1120 Last data filed at 06/22/2018 0905 Gross per 24 hour  Intake 720 ml  Output 950 ml  Net -230 ml   Filed Weights   06/06/18 2246 06/14/18 1151  Weight: 123.8 kg 123.8 kg    Examination:  General exam: Appears calm and comfortable  Respiratory system: Clear to auscultation. Respiratory effort normal. Cardiovascular system: Distant heart sounds, regular rate and rhythm, no murmurs Gastrointestinal system: Abdomen is nondistended, soft and nontender. No organomegaly or masses felt. Normal bowel sounds heard. Central nervous system: Diffusely weak, 4 out of 5 strength in lower extremities, 4 out of 5 strength in right upper extremity, 3-4 out of 5 in the left upper extremity, diminished grip strength on left Extremities: 3+ lower extremity edema Skin: Incision site is clean dry and  intact Psychiatry: Judgement and insight appear normal. Mood & affect anxious    Data Reviewed: I have personally reviewed following labs and imaging studies  CBC: Recent Labs  Lab 06/17/18 0753 06/18/18 0337 06/19/18 0328 06/20/18 0325 06/22/18 0310  WBC 22.2* 19.8* 21.8* 20.9* 25.3*  NEUTROABS 18.0*  --   --   --   --   HGB 8.5* 7.9* 7.4* 7.6* 8.5*  HCT 28.2* 25.9* 24.4* 25.3* 27.7*  MCV 93.1 92.8 93.5 95.5 93.3  PLT 180 148* 129* 116* 122*   Basic Metabolic Panel: Recent Labs  Lab 06/17/18 0519 06/18/18 0337 06/19/18 0328 06/20/18 0325 06/22/18 0310  NA 132* 132* 132* 134* 135  K 4.6 4.5 4.7 4.9 4.9  CL 94* 95* 97* 101 102  CO2 24 24 24 27 24   GLUCOSE 151* 160* 189* 128* 174*  BUN 43* 54* 42* 34* 28*  CREATININE 1.48* 1.45* 0.96 0.75 0.81  CALCIUM 9.6 9.3 9.0 9.1 9.3  MG  --   --   --   --  2.2   GFR: Estimated Creatinine Clearance: 138.7 mL/min (by C-G formula based on SCr of 0.81 mg/dL). Liver Function Tests: Recent Labs  Lab 06/22/18 0310  AST 16  ALT 21  ALKPHOS 58  BILITOT 0.6  PROT 5.7*  ALBUMIN 2.8*   No results for input(s): LIPASE, AMYLASE in the last 168 hours. No results for input(s): AMMONIA in the last 168 hours. Coagulation Profile: No results for input(s): INR, PROTIME in the last 168 hours. Cardiac Enzymes: No results  for input(s): CKTOTAL, CKMB, CKMBINDEX, TROPONINI in the last 168 hours. BNP (last 3 results) No results for input(s): PROBNP in the last 8760 hours. HbA1C: No results for input(s): HGBA1C in the last 72 hours. CBG: Recent Labs  Lab 06/22/18 0805 06/22/18 0842  GLUCAP 153* 166*   Lipid Profile: No results for input(s): CHOL, HDL, LDLCALC, TRIG, CHOLHDL, LDLDIRECT in the last 72 hours. Thyroid Function Tests: Recent Labs    06/20/18 0842  TSH 1.245   Anemia Panel: No results for input(s): VITAMINB12, FOLATE, FERRITIN, TIBC, IRON, RETICCTPCT in the last 72 hours. Sepsis Labs: No results for input(s):  PROCALCITON, LATICACIDVEN in the last 168 hours.  Recent Results (from the past 240 hour(s))  MRSA PCR Screening     Status: None   Collection Time: 06/14/18  1:10 AM  Result Value Ref Range Status   MRSA by PCR NEGATIVE NEGATIVE Final    Comment:        The GeneXpert MRSA Assay (FDA approved for NASAL specimens only), is one component of a comprehensive MRSA colonization surveillance program. It is not intended to diagnose MRSA infection nor to guide or monitor treatment for MRSA infections. Performed at [facility], [address], [place], [place] [address]   Surgical PCR screen     Status: Abnormal   Collection Time: 06/14/18  4:40 AM  Result Value Ref Range Status   MRSA, PCR NEGATIVE NEGATIVE Final   Staphylococcus aureus POSITIVE (A) NEGATIVE Final    Comment: (NOTE) The Xpert SA Assay (FDA approved for NASAL specimens in patients 70 years of age and older), is one component of a comprehensive surveillance program. It is not intended to diagnose infection nor to guide or monitor treatment. Performed at [facility], [address], [place], [place] [address]          Radiology Studies: Ct Cervical Spine Wo Contrast  Result Date: 06/21/2018 CLINICAL DATA:  Neck pain after surgery. EXAM: CT CERVICAL SPINE WITHOUT CONTRAST TECHNIQUE: Multidetector CT imaging of the cervical spine was performed without intravenous contrast. Multiplanar CT image reconstructions were also generated. COMPARISON:  MRI cervical spine 06/07/2018. FINDINGS: Alignment: Straightening of the normal cervical lordosis. 2 mm anterolisthesis C2-3. Skull base and vertebrae: Status post C4 corpectomy, with C3-C5 ACDF. Satisfactory C4 cage placement, as well as C3 through C5 plate and screws. Additional posterior decompression, C3-C5, followed by lateral mass screws BILATERAL C3-C5 and BILATERAL C2 pars screws, connected by rods. Satisfactory posterior hardware placement. Hardware appears  intact. Autograft material is noted bilaterally. Soft tissues and spinal canal: Residual osseous spurring and OPLL, most notable on the LEFT at lower C3 and upper C4. Central OPLL prominent behind C5. Disc levels:  Spondylosis at C5-6. Upper chest: Unremarkable. Other: None IMPRESSION: Posterior cervical decompression with lateral mass fusion, C2 through C5, as described above. Some residual OPLL at C3, C4, and C5, but with good decompression of the spinal canal. Electronically Signed   By: Elsie Stain M.D.   On: 06/21/2018 12:41        Scheduled Meds: . dexamethasone  10 mg Intravenous Q6H  . febuxostat  40 mg Oral Daily  . gabapentin  300 mg Oral TID  . Influenza vac split quadrivalent PF  0.5 mL Intramuscular Tomorrow-1000  . insulin aspart  0-5 Units Subcutaneous QHS  . insulin aspart  0-9 Units Subcutaneous TID WC  . methocarbamol  750 mg Oral QID  . midodrine  5 mg Oral TID WC  .  pantoprazole  40 mg Oral BID  . polyethylene glycol  17 g Oral BID  . sodium chloride flush  3 mL Intravenous Q12H   Continuous Infusions: . sodium chloride Stopped (06/21/18 1341)  . methocarbamol (ROBAXIN) IV Stopped (06/21/18 1341)     LOS: 15 days    Time spent: 35    Delaine Lame, MD [facility]  If 7PM-7AM, please contact night-coverage [email or web address] Password [email or web address] 06/22/2018, 11:20 AM

## 2018-06-22 NOTE — Plan of Care (Signed)
Patient is voiding and having BMs, patient is expressing positive thoughts and reduced anxiety.

## 2018-06-22 NOTE — Progress Notes (Signed)
Patient ID: [name] [name], male   DOB: 1966-05-10, 52 y.o.   MRN: [id number] MRI scan shows posterior cervical epidural hematoma causing mass-effect on the spinal cord.  Overall patient's clinical exam is stable from this morning however that is some loss of strength in both upper extremities and increased numbness from 24 to 48 hours ago.  Due to the MRI findings and progression of clinical syndrome I recommended posterior cervical reexploration of cervical wound for evacuation of hematoma.  I extensively gone over the risks and benefits of the procedure with him as well as perioperative course expectations of outcome and alternatives of surgery and he understands and agrees to proceed forward.

## 2018-06-22 NOTE — Progress Notes (Signed)
Patient ID: [name] [name], male   DOB: [birth year], 52 y.o.   MRN: [id number] Patient feels acutely this morning developed weakness in both upper extremities it was new for him.  He says throughout the night he was doing well this morning when he woke up he experienced sudden onset numbness tingling weakness in his left arm which had been his good arm and worsening numbness in his right hand.  Ports he cannot lift his left arm over his head.  Denies any significant left neck pain  Strength assessment overall seems at his baseline with 4 out of 5 weakness in his hand intrinsics in the right upper extremity 5 out of 5 bicep tricep and deltoid about 4+ out of 5 left upper extremity has 4 out of 5 weakness of his left deltoid bicep and tricep are 4+ out of 5 hand intrinsics are 4 out of 5.  Bilateral lower extremities appear to be 5 out of 5 incision appears to be good with minimal swelling nontender  Proceed forward with stat MRI cervical spine.

## 2018-06-22 NOTE — Op Note (Signed)
Preoperative diagnosis: Posterior cervical wound epidural hematoma  Postoperative diagnosis: Same  Procedure reexploration posterior cervical wound for evacuation of epidural hematoma  Surgeon: Donalee Citrin  Assistant: Tressie Stalker  Anesthesia: General  EBL: Minimal  HPI: Patient is a 52 year old gentleman who underwent posterior cervical decompression and fusion 8 days ago was doing fairly well however yesterday started experiencing a little bit of weakness in his right hand in conjunction with having lowered his postoperative steroid dose we checked a CT scan which showed adequate position of his hardware and no complicating feature we bumped up his steroids however he woke up this morning with worsening weakness in his hand as well as numbness and weakness in the opposite left upper extremity that he had been asymptomatic and prior.  At this point we ordered an emergent MRI scan which showed a large epidural hematoma and patient was urgently taken to the OR for evacuation.  I extensively went over the risks and benefits of the procedure with him as well as perioperative course expectations of outcome and alternatives of surgery and he understood and agreed to proceed forward.  Operative procedure: Patient brought into the OR and induced under general anesthesia positioned prone on the in pins on flat rolls in the back of his neck was prepped and draped and routine sterile fashion.  His old incision was opened up and immediate old blood clot under pressure came out and was aspirated the fascial stitches and subcutaneous tissues were incised cerebellar retractors were placed in a very large organized epidural hematoma in addition to liquid blood and diffuse oozing from the paraspinal tissues as well as some from the superior left-sided laminotomy defect at the site of the entry of the left C2 screw.  I explored the wound evacuate all the hematoma decompress the thecal sac which the dura did appear  to be soft after decompression.  Coagulated multiple small bleeders throughout the paraspinal soft tissues there was a couple large venous bleeders that were also coagulated.  Through meticulous hemostasis copious irrigation Surgifoam Gelfoam and multiple re-applications a clean dry wound was achieved.  At this point utilizing Surgifoam and Gelfoam I placed a large Hemovac drain closed the muscle approximated to hemostasis with 0 interrupted Vicryls closed the fascia with 0 interrupted Vicryls subtenons tissue with interrupted Vicryls and a running nylon the skin.  So the drain in place wound was dressed patient recovery in stable condition.  At the end the case on the account sponge counts were correct.  Of note due to the diffuse oozing I do feel that patient needs to be worked up for a coagulopathy.  His preoperative platelet count was 120 however I have concerns that these are dysfunctional platelets.  I will be ordering a transfusion of platelets ordering a PT/INR and tomorrow we will discuss with the medical service consulting hematology.

## 2018-06-22 NOTE — Anesthesia Procedure Notes (Signed)
Procedure Name: Intubation Date/Time: 06/22/2018 5:36 PM Performed by: Waynard Edwards, CRNA Pre-anesthesia Checklist: Patient identified, Emergency Drugs available, Suction available and Patient being monitored Patient Re-evaluated:Patient Re-evaluated prior to induction Oxygen Delivery Method: Circle system utilized Preoxygenation: Pre-oxygenation with 100% oxygen Induction Type: IV induction Ventilation: Mask ventilation without difficulty Laryngoscope Size: Glidescope and 4 Grade View: Grade I Tube type: Oral Tube size: 8.0 mm Number of attempts: 1 Airway Equipment and Method: Video-laryngoscopy and Rigid stylet Placement Confirmation: ETT inserted through vocal cords under direct vision,  positive ETCO2 and breath sounds checked- equal and bilateral Secured at: 23 cm Tube secured with: Tape Dental Injury: Teeth and Oropharynx as per pre-operative assessment

## 2018-06-22 NOTE — Anesthesia Postprocedure Evaluation (Signed)
Anesthesia Post Note  Patient: [name] [name]  Procedure(s) Performed: POSTERIOR CERVICAL LAMINECTOMY FOR EVACUATION OF HEMATOMA (N/A Spine Cervical)     Patient location during evaluation: PACU Anesthesia Type: General Level of consciousness: awake and alert Pain management: pain level controlled Vital Signs Assessment: post-procedure vital signs reviewed and stable Respiratory status: spontaneous breathing, nonlabored ventilation, respiratory function stable and patient connected to nasal cannula oxygen Cardiovascular status: blood pressure returned to baseline and stable Postop Assessment: no apparent nausea or vomiting Anesthetic complications: no    Last Vitals:  Vitals:   06/22/18 2035 06/22/18 2050  BP: 123/77 119/70  Pulse: 65 64  Resp: 13 18  Temp:  (!) 36.4 C  SpO2: 98% 98%    Last Pain:  Vitals:   06/22/18 2050  TempSrc:   PainSc: 2                  Shelton Silvas

## 2018-06-22 NOTE — Progress Notes (Signed)
PT Cancellation Note  Patient Details Name: Kenshawn Maciolek MRN: [id number] DOB: 1965/12/31   Cancelled Treatment:    Reason Eval/Treat Not Completed: Patient at procedure or test/unavailable.  Pt has shown worsening L sided weakness today.  Pt leaving imminently for MRI.  Will see 10/11 as able.    [name] Samreet [place] 06/22/2018, 2:30 PM

## 2018-06-22 NOTE — Transfer of Care (Signed)
Immediate Anesthesia Transfer of Care Note  Patient: [name] [name]  Procedure(s) Performed: POSTERIOR CERVICAL LAMINECTOMY FOR EVACUATION OF HEMATOMA (N/A Spine Cervical)  Patient Location: PACU  Anesthesia Type:General  Level of Consciousness: drowsy  Airway & Oxygen Therapy: Patient Spontanous Breathing and Patient connected to face mask oxygen  Post-op Assessment: Report given to RN and Post -op Vital signs reviewed and stable  Post vital signs: Reviewed and stable  Last Vitals:  Vitals Value Taken Time  BP 147/90 06/22/2018  7:17 PM  Temp    Pulse 69 06/22/2018  7:22 PM  Resp 16 06/22/2018  7:22 PM  SpO2 100 % 06/22/2018  7:22 PM  Vitals shown include unvalidated device data.  Last Pain:  Vitals:   06/22/18 1203  TempSrc: Oral  PainSc:       Patients Stated Pain Goal: 4 (06/22/18 0413)  Complications: No apparent anesthesia complications

## 2018-06-22 NOTE — Progress Notes (Signed)
Inpatient Rehabilitation-Admissions Coordinator   Met with pt at the bedside to discuss post acute rehab. Pt remains frustrated with his the way his body is reacting during therapy. AC discussed that in his current state with orthostatic hypotension and low activity tolerance, he is not appropriate for CIR. Pt also has concerns for his future DC plans as his parent's house is not wc accessible and they would be unable to provide physical assist for him at home. AC will continue to follow at a distance and monitor tolerance for therapy.   Please call if questions.   Jhonnie Garner, OTR/L  Rehab Admissions Coordinator  [phone] 06/22/2018 10:55 AM

## 2018-06-22 NOTE — Social Work (Addendum)
CSW was alerted by [facility] admissions coordinator that pt may prefer SNF at discharge instead of CIR. Pt only insurance coverage is [employer] which during pt's last admission CSW was aware that it was only accepted as a pt's secondary coverage at most SNFs.  CSW called [place] and left message for Assunta Gambles at [phone] ext. [phone] regarding logistics of SNF referral for a pt with [employer] coverage only. Await return call.  1:35pm- CSW received return call from Tobi Bastos, she states that she is unsure which SNF accepts [employer] and encouraged CSW to send in referral to [facility] committee for review.   Doy Hutching, LCSWA [facility] Clinical Social Work [phone]

## 2018-06-23 ENCOUNTER — Inpatient Hospital Stay: Payer: Self-pay

## 2018-06-23 ENCOUNTER — Encounter (HOSPITAL_COMMUNITY): Payer: Self-pay | Admitting: Neurosurgery

## 2018-06-23 ENCOUNTER — Inpatient Hospital Stay (HOSPITAL_COMMUNITY)

## 2018-06-23 DIAGNOSIS — D689 Coagulation defect, unspecified: Secondary | ICD-10-CM

## 2018-06-23 DIAGNOSIS — D75A Glucose-6-phosphate dehydrogenase (G6PD) deficiency without anemia: Secondary | ICD-10-CM

## 2018-06-23 LAB — BASIC METABOLIC PANEL
Anion gap: 13 (ref 5–15)
BUN: 27 mg/dL — AB (ref 6–20)
CALCIUM: 9.3 mg/dL (ref 8.9–10.3)
CHLORIDE: 99 mmol/L (ref 98–111)
CO2: 23 mmol/L (ref 22–32)
CREATININE: 0.94 mg/dL (ref 0.61–1.24)
GFR calc non Af Amer: >60 mL/min (ref >60)
Glucose, Bld: 162 mg/dL — ABNORMAL HIGH (ref 70–99)
Potassium: 4.8 mmol/L (ref 3.5–5.1)
SODIUM: 135 mmol/L (ref 135–145)

## 2018-06-23 LAB — PREPARE RBC (CROSSMATCH)

## 2018-06-23 LAB — PREPARE PLATELET PHERESIS: Unit division: 0

## 2018-06-23 LAB — CBC
HCT: 24.3 % — ABNORMAL LOW (ref 39.0–52.0)
HEMATOCRIT: 23.8 % — AB (ref 39.0–52.0)
HEMOGLOBIN: 7.1 g/dL — AB (ref 13.0–17.0)
HEMOGLOBIN: 7.3 g/dL — AB (ref 13.0–17.0)
MCH: 27.8 pg (ref 26.0–34.0)
MCH: 28.9 pg (ref 26.0–34.0)
MCHC: 29.2 g/dL — AB (ref 30.0–36.0)
MCHC: 30.7 g/dL (ref 30.0–36.0)
MCV: 95.3 fL (ref 80.0–100.0)
MCV: 94.1 fL (ref 80.0–100.0)
Platelets: 144 10*3/uL — ABNORMAL LOW (ref 150–400)
Platelets: 139 10*3/uL — ABNORMAL LOW (ref 150–400)
RBC: 2.55 MIL/uL — ABNORMAL LOW (ref 4.22–5.81)
RBC: 2.53 MIL/uL — ABNORMAL LOW (ref 4.22–5.81)
RDW: 17.4 % — ABNORMAL HIGH (ref 11.5–15.5)
RDW: 17.9 % — AB (ref 11.5–15.5)
WBC: 17.4 10*3/uL — AB (ref 4.0–10.5)
WBC: 15.8 10*3/uL — AB (ref 4.0–10.5)
nRBC: 0.2 % (ref 0.0–0.2)
nRBC: 0.3 % — ABNORMAL HIGH (ref 0.0–0.2)

## 2018-06-23 LAB — BPAM PLATELET PHERESIS
Blood Product Expiration Date: 201910122359
ISSUE DATE / TIME: 201910102236
Unit Type and Rh: 5100

## 2018-06-23 LAB — IRON AND TIBC
IRON: 57 ug/dL (ref 45–182)
SATURATION RATIOS: 20 % (ref 17.9–39.5)
TIBC: 281 ug/dL (ref 250–450)
UIBC: 224 ug/dL

## 2018-06-23 LAB — GLUCOSE, CAPILLARY
GLUCOSE-CAPILLARY: 130 mg/dL — AB (ref 70–99)
Glucose-Capillary: 133 mg/dL — ABNORMAL HIGH (ref 70–99)
Glucose-Capillary: 171 mg/dL — ABNORMAL HIGH (ref 70–99)
Glucose-Capillary: 164 mg/dL — ABNORMAL HIGH (ref 70–99)

## 2018-06-23 LAB — FERRITIN: FERRITIN: 506 ng/mL — AB (ref 24–336)

## 2018-06-23 MED ORDER — IOHEXOL 300 MG/ML  SOLN
100.0000 mL | Freq: Once | INTRAMUSCULAR | Status: AC | PRN
  Administered 2018-06-23: 100 mL via INTRAVENOUS

## 2018-06-23 MED ORDER — SODIUM CHLORIDE 0.9% FLUSH: 10.0000 mL | INTRAVENOUS | Status: DC | PRN

## 2018-06-23 MED ORDER — FUROSEMIDE 10 MG/ML IJ SOLN
20.0000 mg | Freq: Once | INTRAMUSCULAR | Status: DC
  Filled 2018-06-23: qty 2

## 2018-06-23 MED ORDER — FUROSEMIDE 10 MG/ML IJ SOLN
20.0000 mg | Freq: Once | INTRAMUSCULAR | Status: DC
  Filled 2018-06-23 (×2): qty 2

## 2018-06-23 MED ORDER — SODIUM CHLORIDE 0.9% IV SOLUTION: Freq: Once | INTRAVENOUS | Status: AC

## 2018-06-23 MED ORDER — SODIUM CHLORIDE 0.9% IV SOLUTION: Freq: Once | INTRAVENOUS | Status: DC

## 2018-06-23 MED ORDER — SODIUM CHLORIDE 0.9% FLUSH
10.0000 mL | Freq: Two times a day (BID) | INTRAVENOUS | Status: DC
  Administered 2018-06-24 – 2018-06-30 (×12): 10 mL

## 2018-06-23 MED FILL — Thrombin (Recombinant) For Soln 5000 Unit: CUTANEOUS | Qty: 5000 | Status: AC

## 2018-06-23 NOTE — Progress Notes (Signed)
Peripherally Inserted Central Catheter/Midline Placement  The IV Nurse has discussed with the patient and/or persons authorized to consent for the patient, the purpose of this procedure and the potential benefits and risks involved with this procedure.  The benefits include less needle sticks, lab draws from the catheter, and the patient may be discharged home with the catheter. Risks include, but not limited to, infection, bleeding, blood clot (thrombus formation), and puncture of an artery; nerve damage and irregular heartbeat and possibility to perform a PICC exchange if needed/ordered by physician.  Alternatives to this procedure were also discussed.  Bard Power PICC patient education guide, fact sheet on infection prevention and patient information card has been provided to patient /or left at bedside.    PICC/Midline Placement Documentation  PICC Double Lumen 06/23/18 PICC Right Basilic 47 cm 0 cm (Active)  Indication for Insertion or Continuance of Line Poor Vasculature-patient has had multiple peripheral attempts or PIVs lasting less than 24 hours 06/23/2018  8:37 PM  Exposed Catheter (cm) 0 cm 06/23/2018  8:37 PM  Site Assessment Clean;Dry;Intact 06/23/2018  8:37 PM  Lumen #1 Status Flushed;Saline locked;Blood return noted 06/23/2018  8:37 PM  Lumen #2 Status Flushed;Saline locked;Blood return noted 06/23/2018  8:37 PM  Dressing Type Transparent 06/23/2018  8:37 PM  Dressing Status Clean;Dry;Intact;Antimicrobial disc in place 06/23/2018  8:37 PM  Dressing Intervention New dressing 06/23/2018  8:37 PM  Dressing Change Due 06/30/18 06/23/2018  8:37 PM       Ethelda Chick 06/23/2018, 8:38 PM

## 2018-06-23 NOTE — Progress Notes (Signed)
OT Cancellation Note  Patient Details Name: [name] [name] MRN: [id number] DOB: [birth year]   Cancelled Treatment:    Reason Eval/Treat Not Completed: Medical issues which prohibited therapy.   Receiving blood.  RN requested therapy hold today.   Jeani Hawking, OTR/L Acute Rehabilitation Services Pager [phone] Office [phone]   Jeani Hawking M 06/23/2018, 1:48 PM

## 2018-06-23 NOTE — Progress Notes (Signed)
Subjective: Patient reports Significant improvement in sensation strength feeling in movement upper and lower extremities.  Neck pain well controlled  Objective: Vital signs in last 24 hours: Temp:  [97.2 F (36.2 C)-98.1 F (36.7 C)] 97.6 F (36.4 C) (10/11 0440) Pulse Rate:  [51-77] 62 (10/11 0440) Resp:  [10-20] 14 (10/11 0440) BP: (119-158)/(69-102) 121/82 (10/11 0440) SpO2:  [94 %-100 %] 99 % (10/11 0440) Weight:  [123.8 kg] 123.8 kg (10/10 1641)  Intake/Output from previous day: 10/10 0701 - 10/11 0700 In: 750 [P.O.:120; I.V.:500; Blood:30; IV Piggyback:100] Out: 1595 [Urine:1225; Drains:120; Blood:250] Intake/Output this shift: No intake/output data recorded.  Strength 4+ out of 5 grips on the left 4 out of 5 grip on the right otherwise 5 out of 5 upper and lower extremities incision clean dry and intact  Lab Results: Recent Labs    06/22/18 1950 06/23/18 0400  WBC 19.0* 17.4*  HGB 8.2* 7.1*  HCT 28.5* 24.3*  PLT 109* 144*   BMET Recent Labs    06/22/18 0310 06/23/18 0400  NA 135 135  K 4.9 4.8  CL 102 99  CO2 24 23  GLUCOSE 174* 162*  BUN 28* 27*  CREATININE 0.81 0.94  CALCIUM 9.3 9.3    Studies/Results: Ct Cervical Spine Wo Contrast  Result Date: 06/21/2018 CLINICAL DATA:  Neck pain after surgery. EXAM: CT CERVICAL SPINE WITHOUT CONTRAST TECHNIQUE: Multidetector CT imaging of the cervical spine was performed without intravenous contrast. Multiplanar CT image reconstructions were also generated. COMPARISON:  MRI cervical spine 06/07/2018. FINDINGS: Alignment: Straightening of the normal cervical lordosis. 2 mm anterolisthesis C2-3. Skull base and vertebrae: Status post C4 corpectomy, with C3-C5 ACDF. Satisfactory C4 cage placement, as well as C3 through C5 plate and screws. Additional posterior decompression, C3-C5, followed by lateral mass screws BILATERAL C3-C5 and BILATERAL C2 pars screws, connected by rods. Satisfactory posterior hardware placement.  Hardware appears intact. Autograft material is noted bilaterally. Soft tissues and spinal canal: Residual osseous spurring and OPLL, most notable on the LEFT at lower C3 and upper C4. Central OPLL prominent behind C5. Disc levels:  Spondylosis at C5-6. Upper chest: Unremarkable. Other: None IMPRESSION: Posterior cervical decompression with lateral mass fusion, C2 through C5, as described above. Some residual OPLL at C3, C4, and C5, but with good decompression of the spinal canal. Electronically Signed   By: Elsie Stain M.D.   On: 06/21/2018 12:41   Mr Cervical Spine Wo Contrast  Result Date: 06/22/2018 CLINICAL DATA:  Initial evaluation for acute neck pain. EXAM: MRI CERVICAL SPINE WITHOUT CONTRAST TECHNIQUE: Multiplanar, multisequence MR imaging of the cervical spine was performed. No intravenous contrast was administered. COMPARISON:  Prior MRI from 06/07/2018 as well as CT from 06/21/2018. FINDINGS: Alignment: Straightening of the normal cervical lordosis. No listhesis or malalignment. Vertebrae: Postoperative changes from prior C4 corpectomy with C3 through C5 ACDF. Posterior decompression with fusion at C2 through C5. Vertebral body heights maintained. No acute or interval fracture. No discrete osseous lesions. Cord: Persistent expansile T2 hyperintensity within the cervical spinal cord extending from C4 through C6-7 (series 2, image 8). Signal intensity within the cervical spinal cord otherwise grossly within normal limits although evaluation limited by susceptibility artifact. Curvilinear posterior epidural collection extending from the posterior laminectomy site inferiorly measures up to approximately 4 mm in AP diameter (series 4, image 36). Posterior Fossa, vertebral arteries, paraspinal tissues: Visualized brain and posterior fossa within normal limits. Craniocervical junction normal. Postsurgical changes from recent posterior decompression with fusion present within the  posterior paraspinous soft  tissues. Small postoperative collection at the laminectomy site measures approximately 2.1 x 1.3 x 2.4 cm, although exact measurements difficult given adjacent susceptibility artifact. Normal intravascular flow voids present within the vertebral arteries bilaterally. Disc levels: C2-C3: Residual 5 x 7 mm left central disc osteophyte complex/OPLL extending to approximately C4-5 inferiorly, similar to previous. Persistent mild to moderate spinal stenosis, greater on the left, similar to previous. Spinal cord deviated to the right. Foramina remain patent. C3-C4: Left disc osteophyte complex/OPLL with impingement of the left ventral spinal cord. Status post posterior decompression with fusion. Persistent severe spinal stenosis with impingement upon the left ventral spinal cord, worsened from previous. Spinal cord deviated to the right. Residual uncovertebral and facet hypertrophy with resultant moderate left with mild right foraminal stenosis, grossly stable. C4-C5: Broad-based left eccentric disc osteophyte complex flattens and largely effaces the ventral CSF. Interval posterior decompression with fusion. Persistent severe spinal stenosis, worsened from previous. Thecal sac measures 3 mm in AP diameter at its most narrow point on the left (series 4, image 17), previously 6 mm. Moderate to severe left with moderate right foraminal narrowing, grossly stable. C5-C6: Broad-based posterior disc osteophyte partially effaces the ventral thecal sac. Interval posterior decompression with fusion. Residual mild spinal stenosis, mildly improved from previous. Residual uncovertebral hypertrophy without significant foraminal stenosis, also improved from previous. C6-C7: Mild disc bulge with uncovertebral hypertrophy. Mild left C7 neural foraminal narrowing. No significant right foraminal stenosis. C7-T1: Mild bilateral facet hypertrophy. No significant canal or foraminal stenosis. Visualized upper thoracic spine demonstrates no  other significant findings. IMPRESSION: 1. Postoperative changes from prior C4 corpectomy with ACDF at C3-C5, with posterior decompression and fusion at C2 through C5. Residual left-sided disc osteophyte/OPLL at C2-3 through C4-5 with persistent severe spinal stenosis at C3-4 and C4-5, worsened relative to previous MRI from 06/07/2018. 2. Abnormal cord signal intensity at C4 through C6-7, consistent with edema. 3. Multilevel neural foraminal narrowing at C2-3 through C6-7 as above, relatively similar as compared to previous exam. Electronically Signed   By: Rise Mu M.D.   On: 06/22/2018 16:46    Assessment/Plan: Postop day 1 reexploration of posterior cervical wound for evacuation of hematoma.  Clinically hematomas very sizable and much larger than would be anticipated with normal course.  Patient had a relatively low normal platelet count however I suspect that these platelets may be somewhat dysfunctional.  In addition postoperative INR was 1.29 with an APTT of 29.  I think the patient has an underlying coagulopathy will order full liver function panel and recommend hematology consult.  Patient was not on any blood thinners since his operation last week leading into the last night's operation.  LOS: 16 days    Rmoni Keplinger P 06/23/2018, 7:38 AM

## 2018-06-23 NOTE — Progress Notes (Signed)
Paged hematology for consult. Waiting return call.

## 2018-06-23 NOTE — Progress Notes (Addendum)
PROGRESS NOTE    [name] [name]  ION:[id number] DOB: 1966/07/12 DOA: 06/06/2018 PCP: Devra Dopp, MD    Brief Narrative: 52 year old man with past medical history relevant for gout, hypertension anterior cervical corpectomy of C4 on 03/24/2018 with comp gated postoperative course including development of multiple PEs and DVTs started on anticoagulation and subsequently developed hematoma requiring reexploration on 03/28/2018 status post placement of IVC filter who was discharged to skilled nursing facility and had a complicated course including progressive lower extremity weakness, intermittent fevers of unclear etiology and hospitalizations for "pneumonia".  Patient was admitted to this hospital is a transfer from [facility] with MRI of C-spine that showed postoperative changes and cord compression with cord edema and concern for ischemia.  Patient is status post posterior cervical decompression laminectomy of C3, C4, C5, removal of spinous processes, foraminotomies on 06/14/2018.  On 06/22/2018 patient began to develop worsening left sided paresthesias and weakness and a stat MRI showed ovation evidence of postoperative hematoma.  Patient was taken to the OR with evacuation of hematoma.  Additionally his postoperative course has been complicated by recurrent fevers, migratory arthralgias/arthritis that are thought to be rheumatologic in nature as well as significant orthostasis limiting the amount he can work with physical therapy.   Assessment & Plan:   Principal Problem:   Fever Active Problems:   Myelopathy (HCC)   Benign essential HTN   Cord compression (HCC)   History of pulmonary embolism   Spondylosis, cervical, with myelopathy   Labile blood pressure   Orthostatic hypotension   Leukocytosis  #) Status post cervical decompressive surgery on 06/14/2018: Patient was taken to the OR on 06/22/2018 for new worsening left-sided weakness and numbness had evacuation of large  hematoma with placement of Hemovac drain.  Per their documentation there is some concern that due to mild thrombocytopenia and mildly elevated INR that patient may have coagulopathy -Neurosurgery following, appreciate recommendations -Continue IV dexamethasone, will defer dosing to neurosurgery -Sliding scale insulin started while on high-dose steroids  #) Mild thrombocytopenia a: Neurosurgery is consulted hematology/oncology for work-up of bleeding diathesis. - Cardiolipin antibodies, lupus anticoagulant, factor XIII activity, von Willebrand multimers ordered - Oncology following, appreciate recommendations  #) Orthostatic hypotension: Unclear etiology.  Differential diagnosis includes dysautonomia from an autoimmune or inflammatory source, central dysautonomia from his surgeries, deconditioning due to laying in bed or some combination of all 3.  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  His inflammatory markers were dramatically elevated.  He did have arthrocentesis performed on 06/09/2018 that was unremarkable.  Per review of the chart ambulatory rheumatology was spoken to and recommended steroids which patient is on for his cervical spinal stenosis and surgery. - Rheumatologic work-up currently is unremarkable other than mildly elevated CCP  #) Lower extremity edema/grade 2 diastolic dysfunction: Noted on echo on 03/27/2018.  Improved with 1 dose of albumin -Judicious fluid use  #) Perioperative DVT/PE: Patient is currently not on anticoagulation due to bleeding.  An IVC filter was placed on 03/28/2018 after reexploration of the surgical site due to hematoma was needed.  #) Pain/psych: -Continue gabapentin 300 mg 3 times  daily  Fluids: Tolerating p.o. Elect lites: Monitor and supplement Nutrition: Regular diet  Prophylaxis: SCDs  Disposition: Pending ability continued physical therapy and discharge to skilled  nursing facility  Full code  Consultants:   Neurosurgery [facility] Orthopedic surgery Infectious disease  Procedures:  On 10/2 1. posterior cervical decompressive laminectomy at C3, C4, C5 with removal of the spinous process and complete laminectomy and foraminotomies of the C3, C4, C5 nerve roots. 2. Posterior cervical fusion with lateral mass screws at C3-C4-C5 and pars screws at C2 lies in the globus ellipseLateral mass screw system 3. Posterior lateral arthrodesis C2-C5 utilizing locally harvested autograft mixed with vivigen  06/22/2018: Procedure reexploration posterior cervical wound for evacuation of epidural hematoma  Antimicrobials:   Cefazolin for surgical prophylaxis   Subjective: Patient strength is returned this morning.  He is quite ebullient about his improved symptoms and reports that he is much more eager to work with physical therapy.  He is also much less anxious.  He reports his pain is much improved.  He denies any nausea, vomiting, diarrhea, cough, congestion, rhinorrhea.  Objective: Vitals:   06/23/18 1145 06/23/18 1154 06/23/18 1200 06/23/18 1215  BP:  130/79 130/79 (!) 156/68  Pulse: (!) 48 (!) 44 (!) 44 (!) 47  Resp: 18 19 20 13   Temp:  97.9 F (36.6 C)  98.1 F (36.7 C)  TempSrc:  Oral    SpO2: 100% 100% 100% 100%  Weight:      Height:        Intake/Output Summary (Last 24 hours) at 06/23/2018 1222 Last data filed at 06/23/2018 1215 Gross per 24 hour  Intake 1895.27 ml  Output 1825 ml  Net 70.27 ml   Filed Weights   06/06/18 2246 06/14/18 1151 06/22/18 1641  Weight: 123.8 kg 123.8 kg 123.8 kg    Examination:  General exam: Appears calm and comfortable  Respiratory system: Clear to auscultation. Respiratory effort  normal. Cardiovascular system: Distant heart sounds, regular rate and rhythm, no murmurs Gastrointestinal system: Abdomen is nondistended, soft and nontender. No organomegaly or masses felt. Normal bowel sounds heard. Central nervous system: Bilateral upper extremity and lower extremity strength is 4+ out of 5, intact sensation Extremities: 1+ lower extremity edema Skin: Incision site is clean dry and intact, Hemovac in place Psychiatry: Judgement and insight appear normal. Mood & affect appropriate    Data Reviewed: I have personally reviewed following labs and imaging studies  CBC: Recent Labs  Lab 06/17/18 0753  06/19/18 0328 06/20/18 0325 06/22/18 0310 06/22/18 1950 06/23/18 0400  WBC 22.2*   < > 21.8* 20.9* 25.3* 19.0* 17.4*  NEUTROABS 18.0*  --   --   --   --   --   --   HGB 8.5*   < > 7.4* 7.6* 8.5* 8.2* 7.1*  HCT 28.2*   < > 24.4* 25.3* 27.7* 28.5* 24.3*  MCV 93.1   < > 93.5 95.5 93.3 94.4 95.3  PLT 180   < > 129* 116* 122* 109* 144*   < > = values in this interval not displayed.   Basic Metabolic Panel: Recent Labs  Lab 06/18/18 0337 06/19/18 0328 06/20/18 0325 06/22/18 0310 06/23/18 0400  NA 132* 132* 134* 135 135  K 4.5 4.7 4.9 4.9 4.8  CL 95* 97* 101 102 99  CO2 24 24 27 24 23   GLUCOSE 160* 189* 128* 174* 162*  BUN 54* 42* 34* 28* 27*  CREATININE 1.45* 0.96 0.75 0.81 0.94  CALCIUM 9.3 9.0 9.1 9.3 9.3  MG  --   --   --  2.2  --    GFR: Estimated Creatinine Clearance: 119.5 mL/min (  by C-G formula based on SCr of 0.94 mg/dL). Liver Function Tests: Recent Labs  Lab 06/22/18 0310  AST 16  ALT 21  ALKPHOS 58  BILITOT 0.6  PROT 5.7*  ALBUMIN 2.8*   No results for input(s): LIPASE, AMYLASE in the last 168 hours. No results for input(s): AMMONIA in the last 168 hours. Coagulation Profile: Recent Labs  Lab 06/22/18 1950  INR 1.29   Cardiac Enzymes: No results for input(s): CKTOTAL, CKMB, CKMBINDEX, TROPONINI in the last 168 hours. BNP (last 3  results) No results for input(s): PROBNP in the last 8760 hours. HbA1C: No results for input(s): HGBA1C in the last 72 hours. CBG: Recent Labs  Lab 06/22/18 1200 06/22/18 1620 06/22/18 2123 06/23/18 0734 06/23/18 1156  GLUCAP 121* 105* 134* 133* 130*   Lipid Profile: No results for input(s): CHOL, HDL, LDLCALC, TRIG, CHOLHDL, LDLDIRECT in the last 72 hours. Thyroid Function Tests: No results for input(s): TSH, T4TOTAL, FREET4, T3FREE, THYROIDAB in the last 72 hours. Anemia Panel: No results for input(s): VITAMINB12, FOLATE, FERRITIN, TIBC, IRON, RETICCTPCT in the last 72 hours. Sepsis Labs: No results for input(s): PROCALCITON, LATICACIDVEN in the last 168 hours.  Recent Results (from the past 240 hour(s))  MRSA PCR Screening     Status: None   Collection Time: 06/14/18  1:10 AM  Result Value Ref Range Status   MRSA by PCR NEGATIVE NEGATIVE Final    Comment:        The GeneXpert MRSA Assay (FDA approved for NASAL specimens only), is one component of a comprehensive MRSA colonization surveillance program. It is not intended to diagnose MRSA infection nor to guide or monitor treatment for MRSA infections. Performed at [facility], [address], [place], [place] [address]   Surgical PCR screen     Status: Abnormal   Collection Time: 06/14/18  4:40 AM  Result Value Ref Range Status   MRSA, PCR NEGATIVE NEGATIVE Final   Staphylococcus aureus POSITIVE (A) NEGATIVE Final    Comment: (NOTE) The Xpert SA Assay (FDA approved for NASAL specimens in patients 53 years of age and older), is one component of a comprehensive surveillance program. It is not intended to diagnose infection nor to guide or monitor treatment. Performed at [facility], [address], [place], [place] [address]          Radiology Studies: Mr Cervical Spine Wo Contrast  Result Date: 06/22/2018 CLINICAL DATA:  Initial evaluation for acute neck pain. EXAM: MRI CERVICAL SPINE  WITHOUT CONTRAST TECHNIQUE: Multiplanar, multisequence MR imaging of the cervical spine was performed. No intravenous contrast was administered. COMPARISON:  Prior MRI from 06/07/2018 as well as CT from 06/21/2018. FINDINGS: Alignment: Straightening of the normal cervical lordosis. No listhesis or malalignment. Vertebrae: Postoperative changes from prior C4 corpectomy with C3 through C5 ACDF. Posterior decompression with fusion at C2 through C5. Vertebral body heights maintained. No acute or interval fracture. No discrete osseous lesions. Cord: Persistent expansile T2 hyperintensity within the cervical spinal cord extending from C4 through C6-7 (series 2, image 8). Signal intensity within the cervical spinal cord otherwise grossly within normal limits although evaluation limited by susceptibility artifact. Curvilinear posterior epidural collection extending from the posterior laminectomy site inferiorly measures up to approximately 4 mm in AP diameter (series 4, image 36). Posterior Fossa, vertebral arteries, paraspinal tissues: Visualized brain and posterior fossa within normal limits. Craniocervical junction normal. Postsurgical changes from recent posterior decompression with fusion present within the posterior paraspinous soft tissues.  Small postoperative collection at the laminectomy site measures approximately 2.1 x 1.3 x 2.4 cm, although exact measurements difficult given adjacent susceptibility artifact. Normal intravascular flow voids present within the vertebral arteries bilaterally. Disc levels: C2-C3: Residual 5 x 7 mm left central disc osteophyte complex/OPLL extending to approximately C4-5 inferiorly, similar to previous. Persistent mild to moderate spinal stenosis, greater on the left, similar to previous. Spinal cord deviated to the right. Foramina remain patent. C3-C4: Left disc osteophyte complex/OPLL with impingement of the left ventral spinal cord. Status post posterior decompression with fusion.  Persistent severe spinal stenosis with impingement upon the left ventral spinal cord, worsened from previous. Spinal cord deviated to the right. Residual uncovertebral and facet hypertrophy with resultant moderate left with mild right foraminal stenosis, grossly stable. C4-C5: Broad-based left eccentric disc osteophyte complex flattens and largely effaces the ventral CSF. Interval posterior decompression with fusion. Persistent severe spinal stenosis, worsened from previous. Thecal sac measures 3 mm in AP diameter at its most narrow point on the left (series 4, image 17), previously 6 mm. Moderate to severe left with moderate right foraminal narrowing, grossly stable. C5-C6: Broad-based posterior disc osteophyte partially effaces the ventral thecal sac. Interval posterior decompression with fusion. Residual mild spinal stenosis, mildly improved from previous. Residual uncovertebral hypertrophy without significant foraminal stenosis, also improved from previous. C6-C7: Mild disc bulge with uncovertebral hypertrophy. Mild left C7 neural foraminal narrowing. No significant right foraminal stenosis. C7-T1: Mild bilateral facet hypertrophy. No significant canal or foraminal stenosis. Visualized upper thoracic spine demonstrates no other significant findings. IMPRESSION: 1. Postoperative changes from prior C4 corpectomy with ACDF at C3-C5, with posterior decompression and fusion at C2 through C5. Residual left-sided disc osteophyte/OPLL at C2-3 through C4-5 with persistent severe spinal stenosis at C3-4 and C4-5, worsened relative to previous MRI from 06/07/2018. 2. Abnormal cord signal intensity at C4 through C6-7, consistent with edema. 3. Multilevel neural foraminal narrowing at C2-3 through C6-7 as above, relatively similar as compared to previous exam. Electronically Signed   By: Rise Mu M.D.   On: 06/22/2018 16:46        Scheduled Meds: . dexamethasone  10 mg Intravenous Q6H  . febuxostat  40  mg Oral Daily  . gabapentin  300 mg Oral TID  . Influenza vac split quadrivalent PF  0.5 mL Intramuscular Tomorrow-1000  . insulin aspart  0-5 Units Subcutaneous QHS  . insulin aspart  0-9 Units Subcutaneous TID WC  . methocarbamol  750 mg Oral QID  . midodrine  5 mg Oral TID WC  . pantoprazole  40 mg Oral Daily  . polyethylene glycol  17 g Oral BID  . sodium chloride flush  3 mL Intravenous Q12H  . sodium chloride flush  3 mL Intravenous Q12H   Continuous Infusions: . sodium chloride Stopped (06/21/18 1341)  . sodium chloride    .  ceFAZolin (ANCEF) IV 2 g (06/23/18 4540)  . lactated ringers 10 mL/hr at 06/22/18 1644     LOS: 16 days    Time spent: 35    Delaine Lame, MD [facility]  If 7PM-7AM, please contact night-coverage [email or web address] Password [email or web address] 06/23/2018, 12:22 PM

## 2018-06-23 NOTE — Progress Notes (Signed)
PT Cancellation Note  Patient Details Name: [name] [name] MRN: [id number] DOB: [birth year]   Cancelled Treatment:    Reason Eval/Treat Not Completed: Medical issues which prohibited therapy.  Per RN, hold today, pt with low HgB and to get several units of blood/plasma.  Will hold for today. 06/23/2018  Greer Bing, PT Acute Rehabilitation Services [phone]  (pager) [phone]  (office)   [name] Isela Stantz 06/23/2018, 4:05 PM

## 2018-06-23 NOTE — Consult Note (Signed)
Referral MD  Reason for Referral: Coagulopathy along with possible hypercoagulable state with pulmonary emboli and lower extremity DVTs.  No chief complaint on file. : No one seems to know what is wrong.  HPI: [name] is a very nice 52 year old African-American male.  He is a true American hero.  He served in the [employer].  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  He has been all over the world.  He does have G6PD deficiency.  He also says he has pernicious anemia.  He had been doing great about 6 months ago.  He knew that he had degenerative disks in his neck.  He had his first surgery back in early July.  He had neck fusion.  Right after this, he developed pulmonary emboli.  About 5 days after his surgery, he developed bilateral pulmonary emboli.  There were occlusive and multiple segmental and sub-segmental branches.  He had lower extremity Dopplers done.  He had bilateral thrombi in his posterior tibial veins and peroneal veins.  He was placed initially on heparin and Coumadin.  The next day, 03/28/2018, he had to have reexploration because he was having oozing at the operative site.  He underwent reexploration.  He had a significant amount of blood in the surgical field.  Of note, he has had multiple surgeries in the past.  He has had tendon rupture on the tricep.  He has had knee surgery.  He was a Licensed conveyancer.  He has never had any bleeding problems.  There is no bleeding problems in the family.  He has had no problems with blood clots.  There is no history of blood clots in the family.  He had an IVC filter placed on 03/28/2018.  He had not been taking aspirin products.  He was on low-dose aspirin.  He is not on nonsteroidals.  Is not taking any over-the-counter painkillers with aspirin or nonsteroidals.  On 06/14/2018, he subsequently developed myelopathy.  MRI showed severe stenosis at C3, C4 and C5.  He subsequently underwent a cervical fusion with screws.  He had  arthrodesis from C2-C5.  About the next week, he had to go back to surgery as he was having neurological symptoms and exploration showed a large epidural hematoma.  He had a large organized epidural hematoma with liquid blood and diffuse oozing from the paraspinal tissues.  He has been off blood thinner.  The surgeons did a fantastic job in clearing out the surgical field.  He was then felt to have a coagulopathy.  This is obviously an acquired coagulopathy.  This is incredibly difficult to explain given that he also has a hypercoagulable condition and developing thromboembolic disease after his first surgery in July.  His PT and PTT have been okay.  Of note, his CBC about 2 weeks ago showed a white cell count 10.1.  Hemoglobin 8.6.  Platelet count 256,000.  On 06/14/2018, his white cell count was 10.8.  Hemoglobin 8.9.  Platelet count 315,000.  He had a markedly elevated sed rate on 06/13/2018 of 135.  He was tested for heparin-induced thrombocytopenia.  This test was normal.  On 06/18/2018, his white cell count was 19.8.  Hemoglobin 7.9.  Platelet count 148,000.  His electrolytes have all been okay.  He does not have renal insufficiency.  He does have a history of gout.  His uric acid has been slightly elevated.  On 06/07/2018 had a elevated C-reactive protein of 20.4.  On 06/22/2018, his metabolic panel again looked okay.  His blood sugar was on the high side.  He has had FFP.  He has had a platelet transfusion.  Of note, on 06/09/2018 looks like he had an arthrocentesis done.  I suspect this was for gout.  His iron studies done today showed a ferritin of 506 with an iron saturation of 20%.  We have been asked to see him to try to help figure out why he is having this dichotomy of issues with bleeding but yet blood clotting.  He does not smoke.  He really does not drink.  Again is not taking any supplements.  There is a family history of cancer with throat cancer in his father and  breast cancer with his mother.    Past Medical History:  Diagnosis Date  . Degenerative disc disease, lumbar   . Gout   . History of pernicious anemia 1980   Patient says he takes OTC iron supplements QD  . Hypertension   :  Past Surgical History:  Procedure Laterality Date  . ANTERIOR CERVICAL CORPECTOMY N/A 03/24/2018   Procedure: CERVICAL FOUR ANTERIOR CERVICAL CORPECTOMY;  Surgeon: Donalee Citrin, MD;  Location: [facility] OR;  Service: Neurosurgery;  Laterality: N/A;  . ANTERIOR CERVICAL DECOMP/DISCECTOMY FUSION N/A 03/28/2018   Procedure: Reexploration of anterior cervical wound;  Surgeon: Donalee Citrin, MD;  Location: [facility] OR;  Service: Neurosurgery;  Laterality: N/A;  . IR IVC FILTER PLMT / S&I Lenise Arena GUID/MOD SED  03/28/2018  . IR RADIOLOGIST EVAL & MGMT  05/31/2018  . MENISCUS REPAIR Left   . POSTERIOR CERVICAL FUSION/FORAMINOTOMY N/A 06/14/2018   Procedure: POSTERIOR CERVICAL DECOMPRESSION/FUSION CERVICAL TWO- CERVICAL THREE CERVICAL THREE- CERVICAL FOUR, CERVICAL FOUR- CERVICAL FIVE;  Surgeon: Donalee Citrin, MD;  Location: [facility] OR;  Service: Neurosurgery;  Laterality: N/A;  POSTERIOR CERVICAL DECOMPRESSION/FUSION CERVICAL TWO- CERVICAL THREE CERVICAL THREE- CERVICAL FOUR, CERVICAL FOUR- CERVICAL FIVE  . POSTERIOR CERVICAL LAMINECTOMY N/A 06/22/2018   Procedure: POSTERIOR CERVICAL LAMINECTOMY FOR EVACUATION OF HEMATOMA;  Surgeon: Donalee Citrin, MD;  Location: [facility] OR;  Service: Neurosurgery;  Laterality: N/A;  . TENDON REPAIR     tricept tendon  :   Current Facility-Administered Medications:  .  0.9 %  sodium chloride infusion (Manually program via Guardrails IV Fluids), , Intravenous, Once, Lashawnta Burgert R, MD .  0.9 %  sodium chloride infusion, 250 mL, Intravenous, Continuous, Osvaldo Shipper, MD, Stopped at 06/21/18 1341 .  0.9 %  sodium chloride infusion, 250 mL, Intravenous, Continuous, Donalee Citrin, MD, Last Rate: 1 mL/hr at 06/23/18 1616, 250 mL at 06/23/18 1616 .  acetaminophen (TYLENOL) tablet  650 mg, 650 mg, Oral, Q4H PRN **OR** acetaminophen (TYLENOL) suppository 650 mg, 650 mg, Rectal, Q4H PRN, Donalee Citrin, MD .  ALPRAZolam Prudy Feeler) tablet 1 mg, 1 mg, Oral, BID PRN, Donalee Citrin, MD, 1 mg at 06/23/18 0902 .  alum & mag hydroxide-simeth (MAALOX/MYLANTA) 200-200-20 MG/5ML suspension 30 mL, 30 mL, Oral, Q6H PRN, Donalee Citrin, MD .  benzonatate (TESSALON) capsule 100 mg, 100 mg, Oral, TID PRN, Osvaldo Shipper, MD, 100 mg at 06/23/18 1618 .  ceFAZolin (ANCEF) IVPB 2g/100 mL premix, 2 g, Intravenous, Q8H, Donalee Citrin, MD, Last Rate: 200 mL/hr at 06/23/18 1617, 2 g at 06/23/18 1617 .  cyclobenzaprine (FLEXERIL) tablet 10 mg, 10 mg, Oral, TID PRN, Donalee Citrin, MD .  dexamethasone (DECADRON) injection 10 mg, 10 mg, Intravenous, Q6H, Donalee Citrin, MD, 10 mg at 06/23/18 1816 .  febuxostat (ULORIC) tablet 40 mg, 40 mg, Oral, Daily, Osvaldo Shipper, MD, 40 mg  at 06/23/18 0905 .  furosemide (LASIX) injection 20 mg, 20 mg, Intravenous, Once, Epsie Walthall, Rose Phi, MD .  furosemide (LASIX) injection 20 mg, 20 mg, Intravenous, Once, Kelee Cunningham, Rose Phi, MD .  gabapentin (NEURONTIN) capsule 300 mg, 300 mg, Oral, TID, Osvaldo Shipper, MD, 300 mg at 06/23/18 1618 .  hydrALAZINE (APRESOLINE) injection 10 mg, 10 mg, Intravenous, Q6H PRN, Osvaldo Shipper, MD .  HYDROmorphone (DILAUDID) injection 1 mg, 1 mg, Intravenous, Q2H PRN, Osvaldo Shipper, MD, 1 mg at 06/18/18 1632 .  Influenza vac split quadrivalent PF (FLUARIX) injection 0.5 mL, 0.5 mL, Intramuscular, Tomorrow-1000, Rito Ehrlich, Gokul, MD .  insulin aspart (novoLOG) injection 0-5 Units, 0-5 Units, Subcutaneous, QHS, Donalee Citrin, MD .  insulin aspart (novoLOG) injection 0-9 Units, 0-9 Units, Subcutaneous, TID WC, Donalee Citrin, MD, 2 Units at 06/23/18 1817 .  lactated ringers infusion, , Intravenous, Continuous, Donalee Citrin, MD, Last Rate: 10 mL/hr at 06/22/18 1644 .  menthol-cetylpyridinium (CEPACOL) lozenge 3 mg, 1 lozenge, Oral, PRN **OR** phenol (CHLORASEPTIC) mouth spray  1 spray, 1 spray, Mouth/Throat, PRN, Donalee Citrin, MD .  methocarbamol (ROBAXIN) tablet 750 mg, 750 mg, Oral, QID, Osvaldo Shipper, MD, 750 mg at 06/23/18 1816 .  midodrine (PROAMATINE) tablet 5 mg, 5 mg, Oral, TID WC, Donalee Citrin, MD, 5 mg at 06/23/18 1816 .  morphine 2 MG/ML injection 2 mg, 2 mg, Intravenous, Q4H PRN, Osvaldo Shipper, MD, 2 mg at 06/15/18 2042 .  ondansetron (ZOFRAN) tablet 4 mg, 4 mg, Oral, Q6H PRN **OR** ondansetron (ZOFRAN) injection 4 mg, 4 mg, Intravenous, Q6H PRN, Donalee Citrin, MD .  oxyCODONE (Oxy IR/ROXICODONE) immediate release tablet 15 mg, 15 mg, Oral, Q3H PRN, Osvaldo Shipper, MD, 15 mg at 06/21/18 1228 .  oxyCODONE (Oxy IR/ROXICODONE) immediate release tablet 20 mg, 20 mg, Oral, Q3H PRN, Osvaldo Shipper, MD, 20 mg at 06/23/18 1816 .  pantoprazole (PROTONIX) EC tablet 40 mg, 40 mg, Oral, Daily, Donalee Citrin, MD .  polyethylene glycol (MIRALAX / GLYCOLAX) packet 17 g, 17 g, Oral, BID, Osvaldo Shipper, MD, 17 g at 06/14/18 2210 .  sodium chloride flush (NS) 0.9 % injection 3 mL, 3 mL, Intravenous, Q12H, Osvaldo Shipper, MD, 3 mL at 06/23/18 0904 .  sodium chloride flush (NS) 0.9 % injection 3 mL, 3 mL, Intravenous, PRN, Osvaldo Shipper, MD .  sodium chloride flush (NS) 0.9 % injection 3 mL, 3 mL, Intravenous, Q12H, Donalee Citrin, MD, 3 mL at 06/23/18 0905 .  sodium chloride flush (NS) 0.9 % injection 3 mL, 3 mL, Intravenous, PRN, Donalee Citrin, MD:  . sodium chloride   Intravenous Once  . dexamethasone  10 mg Intravenous Q6H  . febuxostat  40 mg Oral Daily  . furosemide  20 mg Intravenous Once  . furosemide  20 mg Intravenous Once  . gabapentin  300 mg Oral TID  . Influenza vac split quadrivalent PF  0.5 mL Intramuscular Tomorrow-1000  . insulin aspart  0-5 Units Subcutaneous QHS  . insulin aspart  0-9 Units Subcutaneous TID WC  . methocarbamol  750 mg Oral QID  . midodrine  5 mg Oral TID WC  . pantoprazole  40 mg Oral Daily  . polyethylene glycol  17 g Oral BID  . sodium  chloride flush  3 mL Intravenous Q12H  . sodium chloride flush  3 mL Intravenous Q12H  :  Allergies  Allergen Reactions  . Nsaids Other (See Comments)    G6PD DEFICIENCY  . Sulfa Antibiotics Other (See Comments)    G6PD DEFICIENCY  .  Protonix [Pantoprazole Sodium] Diarrhea    Severe diarrhea, Upset stomach  :  Family History  Family history unknown: Yes  :  Social History   Socioeconomic History  . Marital status: Legally Separated    Spouse name: Not on file  . Number of children: Not on file  . Years of education: Not on file  . Highest education level: Not on file  Occupational History  . Not on file  Social Needs  . Financial resource strain: Not on file  . Food insecurity:    Worry: Not on file    Inability: Not on file  . Transportation needs:    Medical: Not on file    Non-medical: Not on file  Tobacco Use  . Smoking status: Never Smoker  . Smokeless tobacco: Never Used  Substance and Sexual Activity  . Alcohol use: Never    Frequency: Never  . Drug use: Never  . Sexual activity: Not Currently    Birth control/protection: Abstinence  Lifestyle  . Physical activity:    Days per week: Not on file    Minutes per session: Not on file  . Stress: Not on file  Relationships  . Social connections:    Talks on phone: Not on file    Gets together: Not on file    Attends religious service: Not on file    Active member of club or organization: Not on file    Attends meetings of clubs or organizations: Not on file    Relationship status: Not on file  . Intimate partner violence:    Fear of current or ex partner: Not on file    Emotionally abused: Not on file    Physically abused: Not on file    Forced sexual activity: Not on file  Other Topics Concern  . Not on file  Social History Narrative  . Not on file  :  Review of Systems  Constitutional: Positive for diaphoresis and fever.  HENT: Positive for ear pain and sore throat.   Eyes: Negative.    Respiratory: Positive for cough.   Cardiovascular: Positive for chest pain, palpitations and leg swelling.  Gastrointestinal: Positive for abdominal pain.  Musculoskeletal: Positive for joint pain and myalgias.  Skin: Negative.   Neurological: Negative.   Endo/Heme/Allergies: Negative.   Psychiatric/Behavioral: Negative.      Exam: Obese African-American male.  He has a neck collar on.  Oral exam shows no mucositis.  He has no scleral icterus.  There is no obvious adenopathy that I can palpate.  His lungs sound clear.  Cardiac exam regular rate and rhythm.  Abdomen is obese but soft.  There is no fluid wave.  There is no palpable liver or spleen tip.  Extremities shows some nonpitting edema in his legs.  He has compression hose on his lower legs.  Skin exam shows no obvious rashes.  Neurological exam is nonfocal. Patient Vitals for the past 24 hrs:  BP Temp Temp src Pulse Resp SpO2  06/23/18 1729 - 97.8 F (36.6 C) Oral - - -  06/23/18 1545 (!) 149/73 97.9 F (36.6 C) Oral (!) 40 13 -  06/23/18 1240 140/66 - - (!) 44 15 100 %  06/23/18 1239 - 98.1 F (36.7 C) - (!) 44 14 100 %  06/23/18 1230 - - - (!) 45 12 100 %  06/23/18 1215 (!) 156/68 98.1 F (36.7 C) - (!) 47 13 100 %  06/23/18 1200 130/79 - - (!) 44 20 100 %  06/23/18 1154 130/79 97.9 F (36.6 C) Oral (!) 44 19 100 %  06/23/18 1145 - - - (!) 48 18 100 %  06/23/18 0946 (!) 155/71 - - (!) 47 11 100 %  06/23/18 0945 (!) 158/127 - - (!) 52 19 100 %  06/23/18 0939 (!) 158/127 98.2 F (36.8 C) - 71 14 98 %  06/23/18 0930 117/67 - - (!) 50 11 100 %  06/23/18 0924 - 98.2 F (36.8 C) - (!) 59 (!) 22 -  06/23/18 0915 128/70 - - (!) 48 15 100 %  06/23/18 0912 128/70 98.1 F (36.7 C) Oral (!) 53 15 100 %  06/23/18 0900 - - - (!) 39 19 (!) 87 %  06/23/18 0739 117/68 98.1 F (36.7 C) Oral (!) 49 14 98 %  06/23/18 0440 121/82 97.6 F (36.4 C) Oral 62 14 99 %  06/23/18 0045 - 97.7 F (36.5 C) - - - -  06/23/18 0004 125/72 97.6 F  (36.4 C) Oral 60 11 100 %  06/22/18 2300 - 98 F (36.7 C) Oral 61 19 97 %  06/22/18 2245 133/71 98.1 F (36.7 C) Oral (!) 55 10 -  06/22/18 2200 - - - 64 14 99 %  06/22/18 2050 119/70 (!) 97.5 F (36.4 C) - 64 18 98 %  06/22/18 2035 123/77 - - 65 13 98 %  06/22/18 2020 130/69 - - (!) 51 13 94 %  06/22/18 1950 140/88 - - 77 18 100 %  06/22/18 1935 (!) 151/102 - - 71 12 98 %  06/22/18 1920 (!) 147/90 (!) 97.2 F (36.2 C) - 76 17 100 %     Recent Labs    06/22/18 1950 06/23/18 0400  WBC 19.0* 17.4*  HGB 8.2* 7.1*  HCT 28.5* 24.3*  PLT 109* 144*   Recent Labs    06/22/18 0310 06/23/18 0400  NA 135 135  K 4.9 4.8  CL 102 99  CO2 24 23  GLUCOSE 174* 162*  BUN 28* 27*  CREATININE 0.81 0.94  CALCIUM 9.3 9.3    Blood smear review: None  Pathology: None    Assessment and Plan: Mr. Royce is a very nice 52 year old African-American male.  Again, he served in the [employer].  I really cannot explain what is going on with him right now.  I really cannot think of a condition that he could have that is causing him to have blood clots but yet he has bleeding with surgery.  He is had past surgeries without any problems.  He has G6PD deficiency.  I do not think this is anything that would be implicated with bleeding or hypercoagulable state.  I asked about sickle cell disease.  There is no history of sickle cell anemia.  He has no past bleeding problems.  As such I cannot imagine that he would have a hereditary coagulopathy.  Clearly, our goal is to try to get him to not bleed but yet also does not somehow treat the thromboembolic events that he has.  I cannot imagine the baby aspirin that he was on would be causing problems with bleeding.  I am sending off a homocystine level.  I am also going to look at a lupus anticoagulant and anti-cardiolipin antibody panel.  I do think that it would be worthwhile checking him for underlying malignancy.  I think this is 1 of those  situations where it would make sense to do a CT scan to check for underlying  malignancy.  He has an IVC filter in.  This certainly is a good idea.  He does have the thromboembolic disease in his lungs.  Somehow, this is going to have to be treated so that you do not end up with pulmonary hypertension and right-sided heart failure.  I definitely would hold on anticoagulation for right now.  I am going to give him 2 units of what.  I think this would be a good idea.  Blood, by itself, is a "natural" coagulant.  This is a incredibly challenging case.  I am not sure we will ever find a reason for him to have had a thromboembolic event and then he has bleeding.    It may be that he will have to go to an academic Medical [facility] that has the technology to be able to uncover what might be going on.  We will have to watch him closely.  We will see what his CT scans show.  We will see what his labs show.  It was my pleasure to talk with him.  I spent over an hour with him.  Again he served our country so we have to do our best to serve him so that he can get better.  [name], MD  [facility]

## 2018-06-24 LAB — LUPUS ANTICOAGULANT PANEL
DRVVT: 35.8 s (ref 0.0–47.0)
PTT Lupus Anticoagulant: 28 s (ref 0.0–51.9)

## 2018-06-24 LAB — BASIC METABOLIC PANEL
Anion gap: 5 (ref 5–15)
BUN: 29 mg/dL — ABNORMAL HIGH (ref 6–20)
Calcium: 9 mg/dL (ref 8.9–10.3)
GFR calc Af Amer: >60 mL/min (ref >60)
GFR calc non Af Amer: >60 mL/min (ref >60)
Glucose, Bld: 131 mg/dL — ABNORMAL HIGH (ref 70–99)
Potassium: 4.6 mmol/L (ref 3.5–5.1)

## 2018-06-24 LAB — PREPARE FRESH FROZEN PLASMA
Unit division: 0
Unit division: 0

## 2018-06-24 LAB — BASIC METABOLIC PANEL WITH GFR
CO2: 27 mmol/L (ref 22–32)
Chloride: 101 mmol/L (ref 98–111)
Creatinine, Ser: 0.84 mg/dL (ref 0.61–1.24)
Sodium: 133 mmol/L — ABNORMAL LOW (ref 135–145)

## 2018-06-24 LAB — BPAM FFP
BLOOD PRODUCT EXPIRATION DATE: 201910152359
Blood Product Expiration Date: 201910152359
ISSUE DATE / TIME: 201910110902
ISSUE DATE / TIME: 201910111227
UNIT TYPE AND RH: 5100
Unit Type and Rh: 5100

## 2018-06-24 LAB — CBC
HCT: 29.3 % — ABNORMAL LOW (ref 39.0–52.0)
Hemoglobin: 9.3 g/dL — ABNORMAL LOW (ref 13.0–17.0)
MCH: 28.9 pg (ref 26.0–34.0)
MCHC: 31.7 g/dL (ref 30.0–36.0)
MCV: 91 fL (ref 80.0–100.0)
Platelets: 110 10*3/uL — ABNORMAL LOW (ref 150–400)
RBC: 3.22 MIL/uL — ABNORMAL LOW (ref 4.22–5.81)
RDW: 16.6 % — ABNORMAL HIGH (ref 11.5–15.5)
WBC: 19 10*3/uL — ABNORMAL HIGH (ref 4.0–10.5)
nRBC: 0 % (ref 0.0–0.2)

## 2018-06-24 LAB — HOMOCYSTEINE: HOMOCYSTEINE-NORM: 13.5 umol/L (ref 0.0–15.0)

## 2018-06-24 LAB — GLUCOSE, CAPILLARY
GLUCOSE-CAPILLARY: 132 mg/dL — AB (ref 70–99)
Glucose-Capillary: 180 mg/dL — ABNORMAL HIGH (ref 70–99)

## 2018-06-24 NOTE — Progress Notes (Signed)
Patient ID: [name] [name], male   DOB: Sep 12, 1966, 52 y.o.   MRN: [id number] BP 131/77 (BP Location: Left Arm)   Pulse (!) 56   Temp 97.9 F (36.6 C) (Oral)   Resp 15   Ht 5\' 9"  (1.753 m)   Wt 123.8 kg   SpO2 98%   BMI 40.30 kg/m  Alert, oriented x 4 Moving all extremities Wound is dry

## 2018-06-24 NOTE — Progress Notes (Signed)
PROGRESS NOTE    [name] [name]  ZOX:[id number] DOB: [birth year] DOA: 06/06/2018 PCP: Devra Dopp, MD    Brief Narrative: 52 year old man with past medical history relevant for gout, hypertension anterior cervical corpectomy of C4 on 03/24/2018 with comp gated postoperative course including development of multiple PEs and DVTs started on anticoagulation and subsequently developed hematoma requiring reexploration on 03/28/2018 status post placement of IVC filter who was discharged to skilled nursing facility and had a complicated course including progressive lower extremity weakness, intermittent fevers of unclear etiology and hospitalizations for "pneumonia".  Patient was admitted to this hospital is a transfer from [facility] with MRI of C-spine that showed postoperative changes and cord compression with cord edema and concern for ischemia.  Patient is status post posterior cervical decompression laminectomy of C3, C4, C5, removal of spinous processes, foraminotomies on 06/14/2018.  On 06/22/2018 patient began to develop worsening left sided paresthesias and weakness and a stat MRI showed ovation evidence of postoperative hematoma.  Patient was taken to the OR with evacuation of hematoma.  Additionally his postoperative course has been complicated by recurrent fevers, migratory arthralgias/arthritis that are thought to be rheumatologic in nature as well as significant orthostasis limiting the amount he can work with physical therapy.   Assessment & Plan:   Principal Problem:   Fever Active Problems:   Myelopathy (HCC)   Benign essential HTN   Cord compression (HCC)   History of pulmonary embolism   Spondylosis, cervical, with myelopathy   Labile blood pressure   Orthostatic hypotension   Leukocytosis  #) Status post cervical decompressive surgery on 06/14/2018: Patient was taken to the OR on 06/22/2018 for new worsening left-sided weakness and numbness had evacuation of large  hematoma with placement of Hemovac drain.  -Neurosurgery following, appreciate recommendations -Continue IV dexamethasone, will defer dosing to neurosurgery -Sliding scale insulin started while on high-dose steroids  #) Mild thrombocytopenia/coagulopathy a:  - Cardiolipin antibodies, lupus anticoagulant, factor XIII activity, von Willebrand multimers ordered - Oncology following, appreciate recommendations  #) Orthostatic hypotension: Unclear etiology.  Differential diagnosis includes dysautonomia from an autoimmune or inflammatory source, central dysautonomia from his surgeries, deconditioning due to laying in bed or some combination of all 3.  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  His inflammatory markers were dramatically elevated.  He did have arthrocentesis performed on 06/09/2018 that was unremarkable.  Per review of the chart ambulatory rheumatology was spoken to and recommended steroids which patient is on for his cervical spinal stenosis and surgery. - Rheumatologic work-up currently is unremarkable other than mildly elevated CCP  #) Lower extremity edema/grade 2 diastolic dysfunction: Noted on echo on 03/27/2018.  -Judicious fluid use  #) Perioperative DVT/PE: Patient is currently not on anticoagulation due to bleeding.  An IVC filter was placed on 03/28/2018 after reexploration of the surgical site due to hematoma was needed.  #) Pain/psych: -Continue gabapentin 300 mg 3 times daily  Fluids: Tolerating p.o. Elect lites: Monitor and supplement Nutrition: Regular diet  Prophylaxis: SCDs  Disposition: Pending ability continued physical therapy and discharge to skilled nursing facility  Full code  Consultants:    Neurosurgery [facility] Orthopedic surgery Infectious disease  Procedures:  On 10/2 1. posterior cervical decompressive laminectomy at C3, C4, C5 with removal of the  spinous process and complete laminectomy and foraminotomies of the C3, C4, C5 nerve roots. 2. Posterior cervical fusion with lateral mass screws at C3-C4-C5 and pars screws at C2 lies in the globus ellipseLateral mass screw system 3. Posterior lateral arthrodesis C2-C5 utilizing locally harvested autograft mixed with vivigen  06/22/2018: Procedure reexploration posterior cervical wound for evacuation of epidural hematoma  Antimicrobials:   Cefazolin for surgical prophylaxis   Subjective: Patient is quite tired this morning.  He denies any nausea, vomiting, diarrhea, cough, congestion, rhinorrhea.  He reports his strength is still bilaterally the same as it was yesterday much improved after the evacuation of the hematoma.  Objective: Vitals:   06/24/18 0210 06/24/18 0234 06/24/18 0454 06/24/18 0731  BP: 106/60 109/60 101/63 131/77  Pulse: (!) 42 (!) 42 (!) 44 (!) 56  Resp: 12 13 14 15   Temp: 97.9 F (36.6 C) 97.6 F (36.4 C) 97.7 F (36.5 C) 97.9 F (36.6 C)  TempSrc: Oral Oral Oral Oral  SpO2: 98% 99% 97% 98%  Weight:      Height:        Intake/Output Summary (Last 24 hours) at 06/24/2018 1014 Last data filed at 06/24/2018 1308 Gross per 24 hour  Intake 3743 ml  Output 2180 ml  Net 1563 ml   Filed Weights   06/06/18 2246 06/14/18 1151 06/22/18 1641  Weight: 123.8 kg 123.8 kg 123.8 kg    Examination:  General exam: Appears calm and comfortable  Respiratory system: Clear to auscultation. Respiratory effort normal. Cardiovascular system: Distant heart sounds, regular rate and rhythm, no murmurs Gastrointestinal system: Abdomen is nondistended, soft and nontender. No organomegaly or masses felt. Normal bowel sounds heard. Central nervous system: Bilateral upper extremity and lower  extremity strength is 4+ out of 5, intact sensation Extremities: 1+ lower extremity edema Skin: Incision site is clean dry and intact, Hemovac in place Psychiatry: Judgement and insight appear normal. Mood & affect appropriate    Data Reviewed: I have personally reviewed following labs and imaging studies  CBC: Recent Labs  Lab 06/20/18 0325 06/22/18 0310 06/22/18 1950 06/23/18 0400 06/23/18 1900  WBC 20.9* 25.3* 19.0* 17.4* 15.8*  HGB 7.6* 8.5* 8.2* 7.1* 7.3*  HCT 25.3* 27.7* 28.5* 24.3* 23.8*  MCV 95.5 93.3 94.4 95.3 94.1  PLT 116* 122* 109* 144* 139*   Basic Metabolic Panel: Recent Labs  Lab 06/18/18 0337 06/19/18 0328 06/20/18 0325 06/22/18 0310 06/23/18 0400  NA 132* 132* 134* 135 135  K 4.5 4.7 4.9 4.9 4.8  CL 95* 97* 101 102 99  CO2 24 24 27 24 23   GLUCOSE 160* 189* 128* 174* 162*  BUN 54* 42* 34* 28* 27*  CREATININE 1.45* 0.96 0.75 0.81 0.94  CALCIUM 9.3 9.0 9.1 9.3 9.3  MG  --   --   --  2.2  --    GFR: Estimated Creatinine Clearance: 119.5 mL/min (by C-G formula based on SCr of 0.94 mg/dL). Liver Function Tests: Recent Labs  Lab 06/22/18 0310  AST 16  ALT 21  ALKPHOS 58  BILITOT 0.6  PROT 5.7*  ALBUMIN 2.8*   No results for input(s): LIPASE, AMYLASE in the last 168 hours. No results for input(s): AMMONIA in the last 168 hours. Coagulation Profile: Recent Labs  Lab 06/22/18 1950  INR 1.29   Cardiac Enzymes: No results for input(s): CKTOTAL, CKMB, CKMBINDEX, TROPONINI in the last 168 hours. BNP (last 3 results) No results for input(s): PROBNP in the last 8760 hours. HbA1C: No results for  input(s): HGBA1C in the last 72 hours. CBG: Recent Labs  Lab 06/22/18 2123 06/23/18 0734 06/23/18 1156 06/23/18 1727 06/23/18 2117  GLUCAP 134* 133* 130* 171* 164*   Lipid Profile: No results for input(s): CHOL, HDL, LDLCALC, TRIG, CHOLHDL, LDLDIRECT in the last 72 hours. Thyroid Function Tests: No results for input(s): TSH, T4TOTAL, FREET4,  T3FREE, THYROIDAB in the last 72 hours. Anemia Panel: Recent Labs    06/23/18 1346  FERRITIN 506*  TIBC 281  IRON 57   Sepsis Labs: No results for input(s): PROCALCITON, LATICACIDVEN in the last 168 hours.  No results found for this or any previous visit (from the past 240 hour(s)).       Radiology Studies: Ct Chest W Contrast  Result Date: 06/24/2018 CLINICAL DATA:  52 year old male with history of deep venous thrombosis. Suspected pulmonary embolism. EXAM: CT CHEST, ABDOMEN, AND PELVIS WITH CONTRAST TECHNIQUE: Multidetector CT imaging of the chest, abdomen and pelvis was performed following the standard protocol during bolus administration of intravenous contrast. CONTRAST:  OMNIPAQUE IOHEXOL 300 MG/ML  SOLN COMPARISON:  Chest CT 03/27/2018. No prior CT the abdomen and pelvis. FINDINGS: CT CHEST FINDINGS Cardiovascular: Today's study was not tailored for evaluation of pulmonary embolism. With this limitation in mind, there is no central or lobar sized filling defects in the pulmonary arterial tree to suggest large pulmonary embolus. Heart size is normal. There is no significant pericardial fluid, thickening or pericardial calcification. No atherosclerotic calcifications noted in the thoracic aorta or the coronary arteries. Right upper extremity PICC with tip terminating in the distal superior vena cava. Mediastinum/Nodes: No pathologically enlarged mediastinal or hilar lymph nodes. Esophagus is unremarkable in appearance. No axillary lymphadenopathy. Lungs/Pleura: Patchy multifocal peribronchovascular ground-glass attenuation is noted in the right lung, most evident in the right upper lobe posteriorly and in the basal segments of the right lower lobe, concerning for multilobar bronchopneumonia. No confluent consolidative airspace disease. No pleural effusions. No suspicious appearing pulmonary nodules or masses are noted. Some dependent atelectasis is also noted in the left lower lobe.  Musculoskeletal: There are no aggressive appearing lytic or blastic lesions noted in the visualized portions of the skeleton. CT ABDOMEN PELVIS FINDINGS Hepatobiliary: No suspicious cystic or solid hepatic lesions. No intra or extrahepatic biliary ductal dilatation. Gallbladder is unremarkable in appearance. Pancreas: No pancreatic mass. No pancreatic ductal dilatation. No pancreatic or peripancreatic fluid or inflammatory changes. Spleen: Unremarkable. Adrenals/Urinary Tract: Bilateral kidneys and bilateral adrenal glands are normal in appearance. No hydroureteronephrosis. Small amount of gas non dependently in the urinary bladder. Urinary bladder is otherwise unremarkable in appearance. Stomach/Bowel: Appearance of the stomach is normal. No pathologic dilatation of small bowel or colon. The appendix is not confidently identified and may be surgically absent. Regardless, there are no inflammatory changes noted adjacent to the cecum to suggest the presence of an acute appendicitis at this time. Vascular/Lymphatic: Mild atherosclerosis in the pelvic vasculature. IVC filter in position with tip terminating below the level of the renal veins. No lymphadenopathy noted in the abdomen or pelvis. Reproductive: Prostate gland and seminal vesicles are unremarkable in appearance. Other: A small umbilical hernia containing a small amount of omental fat. No significant volume of ascites. No pneumoperitoneum. Musculoskeletal: There are no aggressive appearing lytic or blastic lesions noted in the visualized portions of the skeleton. IMPRESSION: 1. Patchy areas of peribronchovascular airspace consolidation in the right lung, compatible with mild multilobar bronchopneumonia. 2. Small amount of gas non dependently in the urinary bladder, nonspecific. Correlation with urinalysis  is suggested, as the possibility of infection with gas-forming organisms is not excluded. Alternatively, this could be iatrogenic if there has been recent  catheterization. 3. Small umbilical hernia containing a small amount of omental fat. No associated bowel incarceration or obstruction. 4. Additional incidental findings, as above. Electronically Signed   By: Trudie Reed M.D.   On: 06/24/2018 00:32   Mr Cervical Spine Wo Contrast  Result Date: 06/22/2018 CLINICAL DATA:  Initial evaluation for acute neck pain. EXAM: MRI CERVICAL SPINE WITHOUT CONTRAST TECHNIQUE: Multiplanar, multisequence MR imaging of the cervical spine was performed. No intravenous contrast was administered. COMPARISON:  Prior MRI from 06/07/2018 as well as CT from 06/21/2018. FINDINGS: Alignment: Straightening of the normal cervical lordosis. No listhesis or malalignment. Vertebrae: Postoperative changes from prior C4 corpectomy with C3 through C5 ACDF. Posterior decompression with fusion at C2 through C5. Vertebral body heights maintained. No acute or interval fracture. No discrete osseous lesions. Cord: Persistent expansile T2 hyperintensity within the cervical spinal cord extending from C4 through C6-7 (series 2, image 8). Signal intensity within the cervical spinal cord otherwise grossly within normal limits although evaluation limited by susceptibility artifact. Curvilinear posterior epidural collection extending from the posterior laminectomy site inferiorly measures up to approximately 4 mm in AP diameter (series 4, image 36). Posterior Fossa, vertebral arteries, paraspinal tissues: Visualized brain and posterior fossa within normal limits. Craniocervical junction normal. Postsurgical changes from recent posterior decompression with fusion present within the posterior paraspinous soft tissues. Small postoperative collection at the laminectomy site measures approximately 2.1 x 1.3 x 2.4 cm, although exact measurements difficult given adjacent susceptibility artifact. Normal intravascular flow voids present within the vertebral arteries bilaterally. Disc levels: C2-C3: Residual 5 x 7  mm left central disc osteophyte complex/OPLL extending to approximately C4-5 inferiorly, similar to previous. Persistent mild to moderate spinal stenosis, greater on the left, similar to previous. Spinal cord deviated to the right. Foramina remain patent. C3-C4: Left disc osteophyte complex/OPLL with impingement of the left ventral spinal cord. Status post posterior decompression with fusion. Persistent severe spinal stenosis with impingement upon the left ventral spinal cord, worsened from previous. Spinal cord deviated to the right. Residual uncovertebral and facet hypertrophy with resultant moderate left with mild right foraminal stenosis, grossly stable. C4-C5: Broad-based left eccentric disc osteophyte complex flattens and largely effaces the ventral CSF. Interval posterior decompression with fusion. Persistent severe spinal stenosis, worsened from previous. Thecal sac measures 3 mm in AP diameter at its most narrow point on the left (series 4, image 17), previously 6 mm. Moderate to severe left with moderate right foraminal narrowing, grossly stable. C5-C6: Broad-based posterior disc osteophyte partially effaces the ventral thecal sac. Interval posterior decompression with fusion. Residual mild spinal stenosis, mildly improved from previous. Residual uncovertebral hypertrophy without significant foraminal stenosis, also improved from previous. C6-C7: Mild disc bulge with uncovertebral hypertrophy. Mild left C7 neural foraminal narrowing. No significant right foraminal stenosis. C7-T1: Mild bilateral facet hypertrophy. No significant canal or foraminal stenosis. Visualized upper thoracic spine demonstrates no other significant findings. IMPRESSION: 1. Postoperative changes from prior C4 corpectomy with ACDF at C3-C5, with posterior decompression and fusion at C2 through C5. Residual left-sided disc osteophyte/OPLL at C2-3 through C4-5 with persistent severe spinal stenosis at C3-4 and C4-5, worsened relative to  previous MRI from 06/07/2018. 2. Abnormal cord signal intensity at C4 through C6-7, consistent with edema. 3. Multilevel neural foraminal narrowing at C2-3 through C6-7 as above, relatively similar as compared to previous exam. Electronically Signed  By: Rise Mu M.D.   On: 06/22/2018 16:46   Ct Abdomen Pelvis W Contrast  Result Date: 06/24/2018 CLINICAL DATA:  52 year old male with history of deep venous thrombosis. Suspected pulmonary embolism. EXAM: CT CHEST, ABDOMEN, AND PELVIS WITH CONTRAST TECHNIQUE: Multidetector CT imaging of the chest, abdomen and pelvis was performed following the standard protocol during bolus administration of intravenous contrast. CONTRAST:  OMNIPAQUE IOHEXOL 300 MG/ML  SOLN COMPARISON:  Chest CT 03/27/2018. No prior CT the abdomen and pelvis. FINDINGS: CT CHEST FINDINGS Cardiovascular: Today's study was not tailored for evaluation of pulmonary embolism. With this limitation in mind, there is no central or lobar sized filling defects in the pulmonary arterial tree to suggest large pulmonary embolus. Heart size is normal. There is no significant pericardial fluid, thickening or pericardial calcification. No atherosclerotic calcifications noted in the thoracic aorta or the coronary arteries. Right upper extremity PICC with tip terminating in the distal superior vena cava. Mediastinum/Nodes: No pathologically enlarged mediastinal or hilar lymph nodes. Esophagus is unremarkable in appearance. No axillary lymphadenopathy. Lungs/Pleura: Patchy multifocal peribronchovascular ground-glass attenuation is noted in the right lung, most evident in the right upper lobe posteriorly and in the basal segments of the right lower lobe, concerning for multilobar bronchopneumonia. No confluent consolidative airspace disease. No pleural effusions. No suspicious appearing pulmonary nodules or masses are noted. Some dependent atelectasis is also noted in the left lower lobe.  Musculoskeletal: There are no aggressive appearing lytic or blastic lesions noted in the visualized portions of the skeleton. CT ABDOMEN PELVIS FINDINGS Hepatobiliary: No suspicious cystic or solid hepatic lesions. No intra or extrahepatic biliary ductal dilatation. Gallbladder is unremarkable in appearance. Pancreas: No pancreatic mass. No pancreatic ductal dilatation. No pancreatic or peripancreatic fluid or inflammatory changes. Spleen: Unremarkable. Adrenals/Urinary Tract: Bilateral kidneys and bilateral adrenal glands are normal in appearance. No hydroureteronephrosis. Small amount of gas non dependently in the urinary bladder. Urinary bladder is otherwise unremarkable in appearance. Stomach/Bowel: Appearance of the stomach is normal. No pathologic dilatation of small bowel or colon. The appendix is not confidently identified and may be surgically absent. Regardless, there are no inflammatory changes noted adjacent to the cecum to suggest the presence of an acute appendicitis at this time. Vascular/Lymphatic: Mild atherosclerosis in the pelvic vasculature. IVC filter in position with tip terminating below the level of the renal veins. No lymphadenopathy noted in the abdomen or pelvis. Reproductive: Prostate gland and seminal vesicles are unremarkable in appearance. Other: A small umbilical hernia containing a small amount of omental fat. No significant volume of ascites. No pneumoperitoneum. Musculoskeletal: There are no aggressive appearing lytic or blastic lesions noted in the visualized portions of the skeleton. IMPRESSION: 1. Patchy areas of peribronchovascular airspace consolidation in the right lung, compatible with mild multilobar bronchopneumonia. 2. Small amount of gas non dependently in the urinary bladder, nonspecific. Correlation with urinalysis is suggested, as the possibility of infection with gas-forming organisms is not excluded. Alternatively, this could be iatrogenic if there has been recent  catheterization. 3. Small umbilical hernia containing a small amount of omental fat. No associated bowel incarceration or obstruction. 4. Additional incidental findings, as above. Electronically Signed   By: Trudie Reed M.D.   On: 06/24/2018 00:32   [place] Ekg Site Rite  Result Date: 06/23/2018 If Site Rite image not attached, placement could not be confirmed due to current cardiac rhythm.       Scheduled Meds: . sodium chloride   Intravenous Once  . dexamethasone  10 mg  Intravenous Q6H  . febuxostat  40 mg Oral Daily  . furosemide  20 mg Intravenous Once  . furosemide  20 mg Intravenous Once  . gabapentin  300 mg Oral TID  . Influenza vac split quadrivalent PF  0.5 mL Intramuscular Tomorrow-1000  . insulin aspart  0-5 Units Subcutaneous QHS  . insulin aspart  0-9 Units Subcutaneous TID WC  . methocarbamol  750 mg Oral QID  . midodrine  5 mg Oral TID WC  . pantoprazole  40 mg Oral Daily  . polyethylene glycol  17 g Oral BID  . sodium chloride flush  10-40 mL Intracatheter Q12H  . sodium chloride flush  3 mL Intravenous Q12H  . sodium chloride flush  3 mL Intravenous Q12H   Continuous Infusions: . sodium chloride Stopped (06/21/18 1341)  . sodium chloride 250 mL (06/23/18 1616)  .  ceFAZolin (ANCEF) IV 2 g (06/24/18 0649)  . lactated ringers 10 mL/hr at 06/22/18 1644     LOS: 17 days    Time spent: 35    Delaine Lame, MD [facility]  If 7PM-7AM, please contact night-coverage [email or web address] Password [email or web address] 06/24/2018, 10:14 AM

## 2018-06-25 LAB — TYPE AND SCREEN
ABO/RH(D): O POS
ANTIBODY SCREEN: NEGATIVE
UNIT DIVISION: 0
Unit division: 0

## 2018-06-25 LAB — BASIC METABOLIC PANEL WITH GFR
Calcium: 8.9 mg/dL (ref 8.9–10.3)
Chloride: 100 mmol/L (ref 98–111)
Creatinine, Ser: 0.92 mg/dL (ref 0.61–1.24)
GFR calc Af Amer: >60 mL/min (ref >60)
GFR calc non Af Amer: >60 mL/min (ref >60)
Sodium: 134 mmol/L — ABNORMAL LOW (ref 135–145)

## 2018-06-25 LAB — CBC
HCT: 29.6 % — ABNORMAL LOW (ref 39.0–52.0)
Hemoglobin: 9.4 g/dL — ABNORMAL LOW (ref 13.0–17.0)
MCH: 29.5 pg (ref 26.0–34.0)
MCHC: 31.8 g/dL (ref 30.0–36.0)
MCV: 92.8 fL (ref 80.0–100.0)
Platelets: 104 10*3/uL — ABNORMAL LOW (ref 150–400)
RBC: 3.19 MIL/uL — ABNORMAL LOW (ref 4.22–5.81)
RDW: 16.6 % — ABNORMAL HIGH (ref 11.5–15.5)
WBC: 11.5 10*3/uL — ABNORMAL HIGH (ref 4.0–10.5)
nRBC: 0.2 % (ref 0.0–0.2)

## 2018-06-25 LAB — BPAM RBC
Blood Product Expiration Date: 201911102359
Blood Product Expiration Date: 201911102359
ISSUE DATE / TIME: 201910112254
ISSUE DATE / TIME: 201910120205
UNIT TYPE AND RH: 5100
Unit Type and Rh: 5100

## 2018-06-25 LAB — BASIC METABOLIC PANEL
Anion gap: 7 (ref 5–15)
BUN: 30 mg/dL — ABNORMAL HIGH (ref 6–20)
CO2: 27 mmol/L (ref 22–32)
Glucose, Bld: 194 mg/dL — ABNORMAL HIGH (ref 70–99)
Potassium: 4.7 mmol/L (ref 3.5–5.1)

## 2018-06-25 LAB — GLUCOSE, CAPILLARY
GLUCOSE-CAPILLARY: 194 mg/dL — AB (ref 70–99)
GLUCOSE-CAPILLARY: 170 mg/dL — AB (ref 70–99)
Glucose-Capillary: 197 mg/dL — ABNORMAL HIGH (ref 70–99)
Glucose-Capillary: 182 mg/dL — ABNORMAL HIGH (ref 70–99)
Glucose-Capillary: 197 mg/dL — ABNORMAL HIGH (ref 70–99)
Glucose-Capillary: 224 mg/dL — ABNORMAL HIGH (ref 70–99)

## 2018-06-25 LAB — CARDIOLIPIN ANTIBODIES, IGG, IGM, IGA
ANTICARDIOLIPIN IGG: 10 GPL U/mL (ref 0–14)
Anticardiolipin IgM: <9 MPL U/mL (ref 0–12)

## 2018-06-25 MED ORDER — DEXAMETHASONE SODIUM PHOSPHATE 4 MG/ML IJ SOLN
4.0000 mg | Freq: Four times a day (QID) | INTRAMUSCULAR | Status: DC
  Administered 2018-06-25 – 2018-06-28 (×11): 4 mg via INTRAVENOUS
  Filled 2018-06-25 (×11): qty 1

## 2018-06-25 NOTE — Progress Notes (Signed)
Patient ID: [name] [name], male   DOB: May 24, 1966, 52 y.o.   MRN: [id number] BP (!) 141/85   Pulse (!) 53   Temp 98.1 F (36.7 C)   Resp 12   Ht 5\' 9"  (1.753 m)   Wt 123.8 kg   SpO2 98%   BMI 40.30 kg/m  Alert and oriented x 4, speech is clear Better strength in the hands, wound is clean, dry Moving lower extremities Overall feels much better today Received transfusions last night.  Decreased decadron.

## 2018-06-25 NOTE — Progress Notes (Signed)
PROGRESS NOTE    [name] [name]  ZOX:[id number] DOB: Apr 09, 1966 DOA: 06/06/2018 PCP: Devra Dopp, MD    Brief Narrative: 52 year old man with past medical history relevant for gout, hypertension anterior cervical corpectomy of C4 on 03/24/2018 with comp gated postoperative course including development of multiple PEs and DVTs started on anticoagulation and subsequently developed hematoma requiring reexploration on 03/28/2018 status post placement of IVC filter who was discharged to skilled nursing facility and had a complicated course including progressive lower extremity weakness, intermittent fevers of unclear etiology and hospitalizations for "pneumonia".  Patient was admitted to this hospital is a transfer from [facility] with MRI of C-spine that showed postoperative changes and cord compression with cord edema and concern for ischemia.  Patient is status post posterior cervical decompression laminectomy of C3, C4, C5, removal of spinous processes, foraminotomies on 06/14/2018.  On 06/22/2018 patient began to develop worsening left sided paresthesias and weakness and a stat MRI showed evidence of postoperative hematoma.  Patient was taken to the OR with evacuation of hematoma.  Additionally his postoperative course has been complicated by recurrent fevers, migratory arthralgias/arthritis that are thought to be rheumatologic in nature as well as significant orthostasis limiting the amount he can work with physical therapy.   Assessment & Plan:   Principal Problem:   Fever Active Problems:   Myelopathy (HCC)   Benign essential HTN   Cord compression (HCC)   History of pulmonary embolism   Spondylosis, cervical, with myelopathy   Labile blood pressure   Orthostatic hypotension   Leukocytosis  #) Status post cervical decompressive surgery on 06/14/2018: Patient was taken to the OR on 06/22/2018 for new worsening left-sided weakness and numbness had evacuation of large hematoma  with placement of Hemovac drain.  -Neurosurgery following, appreciate recommendations -Continue IV dexamethasone, will defer dosing to neurosurgery -Sliding scale insulin started while on high-dose steroids  #) Mild thrombocytopenia/coagulopathy: Neurosurgery was concerned about her coagulopathy due to recurrent hematomas.  Patient is also noted to have mild thrombocytopenia.  He does not have HIT.  Oncology was consulted and gave the patient the platelets, plasma as well as some blood. - Cardiolipin antibodies,factor XIII activity, von Willebrand multimers ordered -Lupus anticoagulant on 06/23/2018 normal - Homocystine level on 06/23/2018 normal - Oncology following, appreciate recommendations  #) Orthostatic hypotension: Unclear etiology.  Differential diagnosis includes dysautonomia from an autoimmune or inflammatory source, central dysautonomia from his surgeries, deconditioning due to laying in bed or some combination of all 3.  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  His inflammatory markers were dramatically elevated.  He did have arthrocentesis performed on 06/09/2018 that was unremarkable.  Per review of the chart ambulatory rheumatology was spoken to and recommended steroids which patient is on for his cervical spinal stenosis and surgery. - Rheumatologic work-up currently is unremarkable other than mildly elevated CCP  #) Lower extremity edema/grade 2 diastolic dysfunction: Noted on echo on 03/27/2018.  -Judicious fluid use  #) Perioperative DVT/PE: Patient is currently not on anticoagulation due to bleeding.  An IVC filter was placed on 03/28/2018 after reexploration of the surgical site due to hematoma was needed.  #)  Pain/psych: -Continue gabapentin 300 mg 3 times daily  Fluids: Tolerating p.o. Elect lites: Monitor and supplement Nutrition: Regular diet  Prophylaxis: SCDs  Disposition: Pending ability continued physical therapy and discharge to skilled nursing facility  Full code  Consultants:   Neurosurgery [facility] Orthopedic surgery Infectious disease  Procedures:  On 10/2 1. posterior cervical decompressive laminectomy at C3, C4, C5 with removal of the spinous process and complete laminectomy and foraminotomies of the C3, C4, C5 nerve roots. 2. Posterior cervical fusion with lateral mass screws at C3-C4-C5 and pars screws at C2 lies in the globus ellipseLateral mass screw system 3. Posterior lateral arthrodesis C2-C5 utilizing locally harvested autograft mixed with vivigen  06/22/2018: Procedure reexploration posterior cervical wound for evacuation of epidural hematoma  Antimicrobials:   Cefazolin for surgical prophylaxis   Subjective: Patient reports he is doing well today.  He denies any nausea, vomiting, diarrhea, cough, congestion.  He reports that his sutures are pulling a little bit on his left side when he moves over however his strength is at baseline and intact.  Objective: Vitals:   06/24/18 1643 06/24/18 2008 06/24/18 2330 06/25/18 0315  BP: 123/71 113/65 130/73 137/81  Pulse: 74 76 (!) 58 (!) 53  Resp: 14 12 16 15   Temp: 97.8 F (36.6 C) 97.6 F (36.4 C) 97.8 F (36.6 C) 97.7 F (36.5 C)  TempSrc: Oral Oral Oral Oral  SpO2: 99% 98% 98% 97%  Weight:      Height:        Intake/Output Summary (Last 24 hours) at 06/25/2018 0929 Last data filed at 06/25/2018 0300 Gross per 24 hour  Intake 320 ml  Output 1450 ml  Net -1130 ml   Filed Weights   06/06/18 2246 06/14/18 1151 06/22/18 1641  Weight: 123.8 kg 123.8 kg 123.8 kg    Examination:  General exam: Appears calm and comfortable  Respiratory system: Clear to auscultation. Respiratory  effort normal. Cardiovascular system: Distant heart sounds, regular rate and rhythm, no murmurs Gastrointestinal system: Abdomen is nondistended, soft and nontender. No organomegaly or masses felt. Normal bowel sounds heard. Central nervous system: Bilateral upper extremity and lower extremity strength is 4+ out of 5, intact sensation Extremities: 1+ lower extremity edema Skin: Incision site is clean dry and intact, Hemovac in place Psychiatry: Judgement and insight appear normal. Mood & affect appropriate    Data Reviewed: I have personally reviewed following labs and imaging studies  CBC: Recent Labs  Lab 06/22/18 1950 06/23/18 0400 06/23/18 1900 06/24/18 1245 06/25/18 0555  WBC 19.0* 17.4* 15.8* 19.0* 11.5*  HGB 8.2* 7.1* 7.3* 9.3* 9.4*  HCT 28.5* 24.3* 23.8* 29.3* 29.6*  MCV 94.4 95.3 94.1 91.0 92.8  PLT 109* 144* 139* 110* 104*   Basic Metabolic Panel: Recent Labs  Lab 06/20/18 0325 06/22/18 0310 06/23/18 0400 06/24/18 1245 06/25/18 0555  NA 134* 135 135 133* 134*  K 4.9 4.9 4.8 4.6 4.7  CL 101 102 99 101 100  CO2 27 24 23 27 27   GLUCOSE 128* 174* 162* 131* 194*  BUN 34* 28* 27* 29* 30*  CREATININE 0.75 0.81 0.94 0.84 0.92  CALCIUM 9.1 9.3 9.3 9.0 8.9  MG  --  2.2  --   --   --    GFR: Estimated Creatinine Clearance: 122.1 mL/min (by C-G formula based on SCr of 0.92 mg/dL). Liver Function Tests: Recent Labs  Lab 06/22/18 0310  AST 16  ALT 21  ALKPHOS 58  BILITOT 0.6  PROT 5.7*  ALBUMIN 2.8*   No results for input(s): LIPASE, AMYLASE in the last 168 hours. No results for input(s): AMMONIA in the last  168 hours. Coagulation Profile: Recent Labs  Lab 06/22/18 1950  INR 1.29   Cardiac Enzymes: No results for input(s): CKTOTAL, CKMB, CKMBINDEX, TROPONINI in the last 168 hours. BNP (last 3 results) No results for input(s): PROBNP in the last 8760 hours. HbA1C: No results for input(s): HGBA1C in the last 72 hours. CBG: Recent Labs  Lab  06/23/18 1727 06/23/18 2117 06/24/18 1134 06/24/18 1707 06/25/18 0800  GLUCAP 171* 164* 132* 180* 194*   Lipid Profile: No results for input(s): CHOL, HDL, LDLCALC, TRIG, CHOLHDL, LDLDIRECT in the last 72 hours. Thyroid Function Tests: No results for input(s): TSH, T4TOTAL, FREET4, T3FREE, THYROIDAB in the last 72 hours. Anemia Panel: Recent Labs    06/23/18 1346  FERRITIN 506*  TIBC 281  IRON 57   Sepsis Labs: No results for input(s): PROCALCITON, LATICACIDVEN in the last 168 hours.  No results found for this or any previous visit (from the past 240 hour(s)).       Radiology Studies: Ct Chest W Contrast  Result Date: 06/24/2018 CLINICAL DATA:  52 year old male with history of deep venous thrombosis. Suspected pulmonary embolism. EXAM: CT CHEST, ABDOMEN, AND PELVIS WITH CONTRAST TECHNIQUE: Multidetector CT imaging of the chest, abdomen and pelvis was performed following the standard protocol during bolus administration of intravenous contrast. CONTRAST:  OMNIPAQUE IOHEXOL 300 MG/ML  SOLN COMPARISON:  Chest CT 03/27/2018. No prior CT the abdomen and pelvis. FINDINGS: CT CHEST FINDINGS Cardiovascular: Today's study was not tailored for evaluation of pulmonary embolism. With this limitation in mind, there is no central or lobar sized filling defects in the pulmonary arterial tree to suggest large pulmonary embolus. Heart size is normal. There is no significant pericardial fluid, thickening or pericardial calcification. No atherosclerotic calcifications noted in the thoracic aorta or the coronary arteries. Right upper extremity PICC with tip terminating in the distal superior vena cava. Mediastinum/Nodes: No pathologically enlarged mediastinal or hilar lymph nodes. Esophagus is unremarkable in appearance. No axillary lymphadenopathy. Lungs/Pleura: Patchy multifocal peribronchovascular ground-glass attenuation is noted in the right lung, most evident in the right upper lobe  posteriorly and in the basal segments of the right lower lobe, concerning for multilobar bronchopneumonia. No confluent consolidative airspace disease. No pleural effusions. No suspicious appearing pulmonary nodules or masses are noted. Some dependent atelectasis is also noted in the left lower lobe. Musculoskeletal: There are no aggressive appearing lytic or blastic lesions noted in the visualized portions of the skeleton. CT ABDOMEN PELVIS FINDINGS Hepatobiliary: No suspicious cystic or solid hepatic lesions. No intra or extrahepatic biliary ductal dilatation. Gallbladder is unremarkable in appearance. Pancreas: No pancreatic mass. No pancreatic ductal dilatation. No pancreatic or peripancreatic fluid or inflammatory changes. Spleen: Unremarkable. Adrenals/Urinary Tract: Bilateral kidneys and bilateral adrenal glands are normal in appearance. No hydroureteronephrosis. Small amount of gas non dependently in the urinary bladder. Urinary bladder is otherwise unremarkable in appearance. Stomach/Bowel: Appearance of the stomach is normal. No pathologic dilatation of small bowel or colon. The appendix is not confidently identified and may be surgically absent. Regardless, there are no inflammatory changes noted adjacent to the cecum to suggest the presence of an acute appendicitis at this time. Vascular/Lymphatic: Mild atherosclerosis in the pelvic vasculature. IVC filter in position with tip terminating below the level of the renal veins. No lymphadenopathy noted in the abdomen or pelvis. Reproductive: Prostate gland and seminal vesicles are unremarkable in appearance. Other: A small umbilical hernia containing a small amount of omental fat. No significant volume of ascites. No pneumoperitoneum. Musculoskeletal: There  are no aggressive appearing lytic or blastic lesions noted in the visualized portions of the skeleton. IMPRESSION: 1. Patchy areas of peribronchovascular airspace consolidation in the right lung,  compatible with mild multilobar bronchopneumonia. 2. Small amount of gas non dependently in the urinary bladder, nonspecific. Correlation with urinalysis is suggested, as the possibility of infection with gas-forming organisms is not excluded. Alternatively, this could be iatrogenic if there has been recent catheterization. 3. Small umbilical hernia containing a small amount of omental fat. No associated bowel incarceration or obstruction. 4. Additional incidental findings, as above. Electronically Signed   By: Trudie Reed M.D.   On: 06/24/2018 00:32   Ct Abdomen Pelvis W Contrast  Result Date: 06/24/2018 CLINICAL DATA:  52 year old male with history of deep venous thrombosis. Suspected pulmonary embolism. EXAM: CT CHEST, ABDOMEN, AND PELVIS WITH CONTRAST TECHNIQUE: Multidetector CT imaging of the chest, abdomen and pelvis was performed following the standard protocol during bolus administration of intravenous contrast. CONTRAST:  OMNIPAQUE IOHEXOL 300 MG/ML  SOLN COMPARISON:  Chest CT 03/27/2018. No prior CT the abdomen and pelvis. FINDINGS: CT CHEST FINDINGS Cardiovascular: Today's study was not tailored for evaluation of pulmonary embolism. With this limitation in mind, there is no central or lobar sized filling defects in the pulmonary arterial tree to suggest large pulmonary embolus. Heart size is normal. There is no significant pericardial fluid, thickening or pericardial calcification. No atherosclerotic calcifications noted in the thoracic aorta or the coronary arteries. Right upper extremity PICC with tip terminating in the distal superior vena cava. Mediastinum/Nodes: No pathologically enlarged mediastinal or hilar lymph nodes. Esophagus is unremarkable in appearance. No axillary lymphadenopathy. Lungs/Pleura: Patchy multifocal peribronchovascular ground-glass attenuation is noted in the right lung, most evident in the right upper lobe posteriorly and in the basal segments of the right  lower lobe, concerning for multilobar bronchopneumonia. No confluent consolidative airspace disease. No pleural effusions. No suspicious appearing pulmonary nodules or masses are noted. Some dependent atelectasis is also noted in the left lower lobe. Musculoskeletal: There are no aggressive appearing lytic or blastic lesions noted in the visualized portions of the skeleton. CT ABDOMEN PELVIS FINDINGS Hepatobiliary: No suspicious cystic or solid hepatic lesions. No intra or extrahepatic biliary ductal dilatation. Gallbladder is unremarkable in appearance. Pancreas: No pancreatic mass. No pancreatic ductal dilatation. No pancreatic or peripancreatic fluid or inflammatory changes. Spleen: Unremarkable. Adrenals/Urinary Tract: Bilateral kidneys and bilateral adrenal glands are normal in appearance. No hydroureteronephrosis. Small amount of gas non dependently in the urinary bladder. Urinary bladder is otherwise unremarkable in appearance. Stomach/Bowel: Appearance of the stomach is normal. No pathologic dilatation of small bowel or colon. The appendix is not confidently identified and may be surgically absent. Regardless, there are no inflammatory changes noted adjacent to the cecum to suggest the presence of an acute appendicitis at this time. Vascular/Lymphatic: Mild atherosclerosis in the pelvic vasculature. IVC filter in position with tip terminating below the level of the renal veins. No lymphadenopathy noted in the abdomen or pelvis. Reproductive: Prostate gland and seminal vesicles are unremarkable in appearance. Other: A small umbilical hernia containing a small amount of omental fat. No significant volume of ascites. No pneumoperitoneum. Musculoskeletal: There are no aggressive appearing lytic or blastic lesions noted in the visualized portions of the skeleton. IMPRESSION: 1. Patchy areas of peribronchovascular airspace consolidation in the right lung, compatible with mild multilobar bronchopneumonia. 2. Small  amount of gas non dependently in the urinary bladder, nonspecific. Correlation with urinalysis is suggested, as the possibility of infection with  gas-forming organisms is not excluded. Alternatively, this could be iatrogenic if there has been recent catheterization. 3. Small umbilical hernia containing a small amount of omental fat. No associated bowel incarceration or obstruction. 4. Additional incidental findings, as above. Electronically Signed   By: Trudie Reed M.D.   On: 06/24/2018 00:32   [place] Ekg Site Rite  Result Date: 06/23/2018 If Site Rite image not attached, placement could not be confirmed due to current cardiac rhythm.       Scheduled Meds: . sodium chloride   Intravenous Once  . dexamethasone  10 mg Intravenous Q6H  . febuxostat  40 mg Oral Daily  . furosemide  20 mg Intravenous Once  . furosemide  20 mg Intravenous Once  . gabapentin  300 mg Oral TID  . Influenza vac split quadrivalent PF  0.5 mL Intramuscular Tomorrow-1000  . insulin aspart  0-5 Units Subcutaneous QHS  . insulin aspart  0-9 Units Subcutaneous TID WC  . methocarbamol  750 mg Oral QID  . midodrine  5 mg Oral TID WC  . pantoprazole  40 mg Oral Daily  . polyethylene glycol  17 g Oral BID  . sodium chloride flush  10-40 mL Intracatheter Q12H  . sodium chloride flush  3 mL Intravenous Q12H  . sodium chloride flush  3 mL Intravenous Q12H   Continuous Infusions: . sodium chloride Stopped (06/21/18 1341)  . sodium chloride 250 mL (06/23/18 1616)  .  ceFAZolin (ANCEF) IV 2 g (06/25/18 3086)  . lactated ringers 10 mL/hr at 06/24/18 1114     LOS: 18 days    Time spent: 35    Delaine Lame, MD [facility]  If 7PM-7AM, please contact night-coverage [email or web address] Password [email or web address] 06/25/2018, 9:29 AM

## 2018-06-26 ENCOUNTER — Inpatient Hospital Stay (HOSPITAL_COMMUNITY)

## 2018-06-26 DIAGNOSIS — Z86718 Personal history of other venous thrombosis and embolism: Secondary | ICD-10-CM

## 2018-06-26 LAB — GLUCOSE, CAPILLARY
GLUCOSE-CAPILLARY: 169 mg/dL — AB (ref 70–99)
GLUCOSE-CAPILLARY: 172 mg/dL — AB (ref 70–99)
Glucose-Capillary: 213 mg/dL — ABNORMAL HIGH (ref 70–99)
Glucose-Capillary: 214 mg/dL — ABNORMAL HIGH (ref 70–99)

## 2018-06-26 LAB — FACTOR 13 ACTIVITY: Factor XIII, Qualitative: NORMAL

## 2018-06-26 LAB — CBC
HCT: 31.2 % — ABNORMAL LOW (ref 39.0–52.0)
Hemoglobin: 9.6 g/dL — ABNORMAL LOW (ref 13.0–17.0)
MCH: 28.7 pg (ref 26.0–34.0)
MCHC: 30.8 g/dL (ref 30.0–36.0)
MCV: 93.4 fL (ref 80.0–100.0)
Platelets: 86 10*3/uL — ABNORMAL LOW (ref 150–400)
RBC: 3.34 MIL/uL — ABNORMAL LOW (ref 4.22–5.81)
RDW: 16.4 % — ABNORMAL HIGH (ref 11.5–15.5)
WBC: 12.6 K/uL — ABNORMAL HIGH (ref 4.0–10.5)
nRBC: 0.2 % (ref 0.0–0.2)

## 2018-06-26 NOTE — Progress Notes (Signed)
Inpatient Rehabilitation  Await medical stability and ability to tolerate therapies prior to contacting insurance for approval.  Plan for Nanine Means to return and follow up tomorrow.  Call if questions.   Charlane Ferretti., CCC/SLP Admission Coordinator  [facility]  Cell [phone]

## 2018-06-26 NOTE — Progress Notes (Signed)
NEUROSURGERY PROGRESS NOTE  Doing well. Complains of appropriate neck soreness that is significantly better. No arm pain. Strength in upper extremities improving, RUE 4/5, LUE 5/5 Incision CDI  Temp:  [97.7 F (36.5 C)-98.2 F (36.8 C)] 97.7 F (36.5 C) (10/14 0725) Pulse Rate:  [59-73] 66 (10/14 0729) Resp:  [13-20] 18 (10/14 0729) BP: (143-153)/(86-97) 152/97 (10/14 0729) SpO2:  [98 %-100 %] 99 % (10/14 0729)  Plan: Will work with therapies today and get oob. Per hem, will start heparin today.   Sherryl Manges, NP 06/26/2018 8:31 AM

## 2018-06-26 NOTE — Progress Notes (Signed)
PT Cancellation Note  Patient Details Name: [name] [name] MRN: [id number] DOB: 03/22/1966   Cancelled Treatment:    Reason Eval/Treat Not Completed: Patient not medically ready. Pt just had dopplers completed and bilat worsening of DVTs. Pt not on blood thinners yet. Will wait until treatment started and patient cleared for mobility.  Lewis Shock, PT, DPT Acute Rehabilitation Services Pager #: [phone] Office #: [phone]    Iona Hansen 06/26/2018, 10:16 AM

## 2018-06-26 NOTE — Progress Notes (Signed)
Attempted to see pt this am.  DVTs are worsening and blood thinners not started yet.  Will hold therapy until blood thinners on board.  Tory Emerald, [place] [phone]

## 2018-06-26 NOTE — Progress Notes (Addendum)
PROGRESS NOTE    [name] [name]  ZOX:[id number] DOB: 1966/01/19 DOA: 06/06/2018 PCP: Devra Dopp, MD    Brief Narrative: 52 year old man with past medical history relevant for gout, hypertension anterior cervical corpectomy of C4 on 03/24/2018 with comp gated postoperative course including development of multiple PEs and DVTs started on anticoagulation and subsequently developed hematoma requiring reexploration on 03/28/2018 status post placement of IVC filter who was discharged to skilled nursing facility and had a complicated course including progressive lower extremity weakness, intermittent fevers of unclear etiology and hospitalizations for "pneumonia".  Patient was admitted to this hospital is a transfer from [facility] with MRI of C-spine that showed postoperative changes and cord compression with cord edema and concern for ischemia.  Patient is status post posterior cervical decompression laminectomy of C3, C4, C5, removal of spinous processes, foraminotomies on 06/14/2018.  On 06/22/2018 patient began to develop worsening left sided paresthesias and weakness and a stat MRI showed evidence of postoperative hematoma.  Patient was taken to the OR with evacuation of hematoma.  Additionally his postoperative course has been complicated by recurrent fevers, migratory arthralgias/arthritis that are thought to be rheumatologic in nature as well as significant orthostasis limiting the amount he can work with physical therapy.   Assessment & Plan:   Principal Problem:   Fever Active Problems:   Myelopathy (HCC)   Benign essential HTN   Cord compression (HCC)   History of pulmonary embolism   Spondylosis, cervical, with myelopathy   Labile blood pressure   Orthostatic hypotension   Leukocytosis  #) Status post cervical decompressive surgery on 06/14/2018: Patient was taken to the OR on 06/22/2018 for new worsening left-sided weakness and numbness had evacuation of large hematoma  with placement of Hemovac drain.  -Neurosurgery following, appreciate recommendations -Continue IV dexamethasone, will defer dosing to neurosurgery -Sliding scale insulin started while on high-dose steroids  #) Mild thrombocytopenia/coagulopathy: Neurosurgery was concerned about her coagulopathy due to recurrent hematomas.  Oncology is following - Cardiolipin antibodies,factor XIII activity, von Willebrand multimers pending -Lupus anticoagulant on 06/23/2018 normal - Homocystine level on 06/23/2018 normal - Oncology following, appreciate recommendations  #) Perioperative DVT/PE: Patient is currently not on anticoagulation due to bleeding.  An IVC filter was placed on 03/28/2018 after reexploration of the surgical site due to hematoma was needed. -Vascular ultrasound ordered today by oncology  #) Orthostatic hypotension: Unclear etiology.  Differential diagnosis includes dysautonomia from an autoimmune or inflammatory source, central dysautonomia from his surgeries, deconditioning due to laying in bed or some combination of all 3.  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  His inflammatory markers were dramatically elevated.  He did have arthrocentesis performed on 06/09/2018 that was unremarkable.  Per review of the chart ambulatory rheumatology was spoken to and recommended steroids which patient is on for his cervical spinal stenosis and surgery. - Rheumatologic work-up currently is unremarkable other than mildly elevated CCP  #) Lower extremity edema/grade 2 diastolic dysfunction: Noted on echo on 03/27/2018.  -Judicious fluid use  #) Pain/psych: -Continue gabapentin 300 mg 3 times daily  Fluids: Tolerating p.o. Elect lites:  Monitor and supplement Nutrition: Regular diet  Prophylaxis: SCDs  Disposition: Pending ability continued physical therapy and discharge to skilled nursing facility  Full code  Consultants:   Neurosurgery  [facility] Orthopedic surgery Infectious disease  Procedures:  On 10/2 1. posterior cervical decompressive laminectomy at C3, C4, C5 with removal of the spinous process and complete laminectomy and foraminotomies of the C3, C4, C5 nerve roots. 2. Posterior cervical fusion with lateral mass screws at C3-C4-C5 and pars screws at C2 lies in the globus ellipseLateral mass screw system 3. Posterior lateral arthrodesis C2-C5 utilizing locally harvested autograft mixed with vivigen  06/22/2018: Procedure reexploration posterior cervical wound for evacuation of epidural hematoma  Antimicrobials:   Cefazolin for surgical prophylaxis   Subjective: Patient reports he is doing well today.  He is eager to start working with physical therapy.  He denies any nausea, vomiting, diarrhea, cough, congestion.  Objective: Vitals:   06/25/18 2300 06/26/18 0300 06/26/18 0725 06/26/18 0729  BP: (!) 153/86 (!) 148/90  (!) 152/97  Pulse: (!) 59 62  66  Resp:  13  18  Temp: 97.9 F (36.6 C) 97.9 F (36.6 C) 97.7 F (36.5 C) 97.7 F (36.5 C)  TempSrc: Oral Oral Oral   SpO2: 100% 98%  99%  Weight:      Height:        Intake/Output Summary (Last 24 hours) at 06/26/2018 1029 Last data filed at 06/26/2018 0800 Gross per 24 hour  Intake 880.06 ml  Output 2870 ml  Net -1989.94 ml   Filed Weights   06/06/18 2246 06/14/18 1151 06/22/18 1641  Weight: 123.8 kg 123.8 kg 123.8 kg    Examination:  General exam: Appears calm and comfortable  Respiratory system: Clear to auscultation. Respiratory effort normal. Cardiovascular system: Distant heart sounds, regular rate and rhythm, no murmurs Gastrointestinal system: Abdomen is nondistended, soft and nontender. No organomegaly or  masses felt. Normal bowel sounds heard. Central nervous system: Bilateral upper extremity and lower extremity strength is 4+ out of 5, intact sensation Extremities: 1+ lower extremity edema Skin: Incision site is clean dry and intact, Hemovac in place Psychiatry: Judgement and insight appear normal. Mood & affect appropriate    Data Reviewed: I have personally reviewed following labs and imaging studies  CBC: Recent Labs  Lab 06/23/18 0400 06/23/18 1900 06/24/18 1245 06/25/18 0555 06/26/18 0500  WBC 17.4* 15.8* 19.0* 11.5* 12.6*  HGB 7.1* 7.3* 9.3* 9.4* 9.6*  HCT 24.3* 23.8* 29.3* 29.6* 31.2*  MCV 95.3 94.1 91.0 92.8 93.4  PLT 144* 139* 110* 104* 86*   Basic Metabolic Panel: Recent Labs  Lab 06/20/18 0325 06/22/18 0310 06/23/18 0400 06/24/18 1245 06/25/18 0555  NA 134* 135 135 133* 134*  K 4.9 4.9 4.8 4.6 4.7  CL 101 102 99 101 100  CO2 27 24 23 27 27   GLUCOSE 128* 174* 162* 131* 194*  BUN 34* 28* 27* 29* 30*  CREATININE 0.75 0.81 0.94 0.84 0.92  CALCIUM 9.1 9.3 9.3 9.0 8.9  MG  --  2.2  --   --   --    GFR: Estimated Creatinine Clearance: 122.1 mL/min (by C-G formula based on SCr of 0.92 mg/dL). Liver Function Tests: Recent Labs  Lab 06/22/18 0310  AST 16  ALT 21  ALKPHOS 58  BILITOT 0.6  PROT 5.7*  ALBUMIN 2.8*   No results for input(s): LIPASE, AMYLASE in the last 168 hours. No results for input(s): AMMONIA in the last 168 hours. Coagulation Profile: Recent Labs  Lab 06/22/18 1950  INR 1.29   Cardiac Enzymes: No results for input(s): CKTOTAL, CKMB, CKMBINDEX, TROPONINI in the last 168 hours. BNP (last 3 results) No  results for input(s): PROBNP in the last 8760 hours. HbA1C: No results for input(s): HGBA1C in the last 72 hours. CBG: Recent Labs  Lab 06/25/18 0800 06/25/18 1154 06/25/18 1805 06/25/18 2223 06/26/18 0755  GLUCAP 194* 197* 224* 170* 213*   Lipid Profile: No results for input(s): CHOL, HDL, LDLCALC, TRIG, CHOLHDL, LDLDIRECT  in the last 72 hours. Thyroid Function Tests: No results for input(s): TSH, T4TOTAL, FREET4, T3FREE, THYROIDAB in the last 72 hours. Anemia Panel: Recent Labs    06/23/18 1346  FERRITIN 506*  TIBC 281  IRON 57   Sepsis Labs: No results for input(s): PROCALCITON, LATICACIDVEN in the last 168 hours.  No results found for this or any previous visit (from the past 240 hour(s)).       Radiology Studies: No results found.      Scheduled Meds: . sodium chloride   Intravenous Once  . dexamethasone  4 mg Intravenous Q6H  . febuxostat  40 mg Oral Daily  . furosemide  20 mg Intravenous Once  . furosemide  20 mg Intravenous Once  . gabapentin  300 mg Oral TID  . Influenza vac split quadrivalent PF  0.5 mL Intramuscular Tomorrow-1000  . insulin aspart  0-5 Units Subcutaneous QHS  . insulin aspart  0-9 Units Subcutaneous TID WC  . methocarbamol  750 mg Oral QID  . midodrine  5 mg Oral TID WC  . pantoprazole  40 mg Oral Daily  . polyethylene glycol  17 g Oral BID  . sodium chloride flush  10-40 mL Intracatheter Q12H  . sodium chloride flush  3 mL Intravenous Q12H  . sodium chloride flush  3 mL Intravenous Q12H   Continuous Infusions: . sodium chloride Stopped (06/21/18 1341)  . sodium chloride 250 mL (06/23/18 1616)  .  ceFAZolin (ANCEF) IV Stopped (06/26/18 0659)  . lactated ringers 10 mL/hr at 06/24/18 1114     LOS: 19 days    Time spent: 35    Delaine Lame, MD [facility]  If 7PM-7AM, please contact night-coverage [email or web address] Password [email or web address] 06/26/2018, 10:29 AM

## 2018-06-26 NOTE — Progress Notes (Signed)
Bilateral Lower Extremity Duplex Complete. Preliminary: DVT noted bilaterally in common femoral, femoral, popliteal, posterior tibial, and peroneal veins. DVT also extends proximally into the mid and distal external iliac veins.  Proximal Iliac and IVC not visualized due to bowel gas. If a more proximal exam is needed, patient will need to be NPO for six hours and order the IVC/ Iliac venous duplex.  Farrel Demark RVT RDMS Shain Pauwels Excell Seltzer RDCS

## 2018-06-26 NOTE — Progress Notes (Signed)
So far, we have not found any reason for him to have had the blood clots but also to have the postop bleeding.  I thought maybe he would have a positive lupus anticoagulant that would explain the low platelets but his lupus anticoagulant and anticardiolipin antibody panels are all normal.  I checked his homocystine level.  This is normal.  I did CT scans of his body.  The CT scans did not show any obvious malignancy that may have triggered a hypercoagulable state.  The CT scan did not show any obvious pulmonary emboli but the scan is not there is on to check for emboli.  I would go ahead and get some Dopplers of his legs.  I want to see if he has any thrombi in his legs.  He does have the filter in.  I do believe that he needs to be on some kind of anticoagulation.  I would probably start heparin on him tomorrow.  That would give him 5 days from surgery which I think would be adequate to minimize any bleeding risk.  He did get 2 units of blood over the weekend.  He MRI probably does have some element of iron deficiency.  As such, I probably give a dose of IV iron.  He still not out of bed yet.  He has a lot of swelling in his feet.  I guess there is some might be some issues with orthostatic hypotension.  Somehow, I have a feeling that we are not going to find any obvious etiology that would link the thromboembolic events with bleeding.  I would not have him on aspirin.  I suppose that even though he was on baby aspirin, that this could have been strong enough to cause platelet dysfunction.  We will still follow along.   [name], MD  Psalms 27:1

## 2018-06-26 NOTE — Progress Notes (Signed)
Called MD with preliminary results of bilateral lower extremity duplex. No new orders received as MD is awaiting for finalized results. Will continue to monitor.

## 2018-06-27 DIAGNOSIS — D696 Thrombocytopenia, unspecified: Secondary | ICD-10-CM

## 2018-06-27 LAB — CBC WITH DIFFERENTIAL/PLATELET
ABS IMMATURE GRANULOCYTES: 0.19 10*3/uL — AB (ref 0.00–0.07)
BASOS PCT: 0 %
Basophils Absolute: 0 10*3/uL (ref 0.0–0.1)
Eosinophils Absolute: 0 10*3/uL (ref 0.0–0.5)
Eosinophils Relative: 0 %
HCT: 31 % — ABNORMAL LOW (ref 39.0–52.0)
Hemoglobin: 9.6 g/dL — ABNORMAL LOW (ref 13.0–17.0)
Immature Granulocytes: 2 %
LYMPHS ABS: 1 10*3/uL (ref 0.7–4.0)
LYMPHS PCT: 8 %
MCH: 29.4 pg (ref 26.0–34.0)
MCHC: 31 g/dL (ref 30.0–36.0)
MCV: 95.1 fL (ref 80.0–100.0)
MONO ABS: 0.6 10*3/uL (ref 0.1–1.0)
MONOS PCT: 5 %
NEUTROS ABS: 10.1 10*3/uL — AB (ref 1.7–7.7)
Neutrophils Relative %: 85 %
PLATELETS: 74 10*3/uL — AB (ref 150–400)
RBC: 3.26 MIL/uL — AB (ref 4.22–5.81)
RDW: 16.7 % — ABNORMAL HIGH (ref 11.5–15.5)
WBC: 11.9 10*3/uL — ABNORMAL HIGH (ref 4.0–10.5)
nRBC: 0 % (ref 0.0–0.2)

## 2018-06-27 LAB — GLUCOSE, CAPILLARY
GLUCOSE-CAPILLARY: 153 mg/dL — AB (ref 70–99)
GLUCOSE-CAPILLARY: 148 mg/dL — AB (ref 70–99)
GLUCOSE-CAPILLARY: 196 mg/dL — AB (ref 70–99)
GLUCOSE-CAPILLARY: 173 mg/dL — AB (ref 70–99)

## 2018-06-27 LAB — PROTIME-INR
INR: 1.23
PROTHROMBIN TIME: 15.4 s — AB (ref 11.4–15.2)

## 2018-06-27 LAB — ANTITHROMBIN III: AntiThromb III Func: 127 % — ABNORMAL HIGH (ref 75–120)

## 2018-06-27 LAB — APTT
aPTT: 25 seconds (ref 24–36)
aPTT: 25 seconds (ref 24–36)

## 2018-06-27 MED ORDER — ARGATROBAN 50 MG/50ML IV SOLN
1.0000 ug/kg/min | INTRAVENOUS | Status: DC
  Filled 2018-06-27: qty 50

## 2018-06-27 MED ORDER — NYSTATIN 100000 UNIT/ML MT SUSP
5.0000 mL | Freq: Three times a day (TID) | OROMUCOSAL | Status: DC
  Administered 2018-06-27 – 2018-08-02 (×139): 500000 [IU] via ORAL
  Filled 2018-06-27 (×138): qty 5

## 2018-06-27 MED ORDER — FLUCONAZOLE 200 MG PO TABS
200.0000 mg | ORAL_TABLET | Freq: Every day | ORAL | Status: DC
  Administered 2018-06-27 – 2018-07-03 (×7): 200 mg via ORAL
  Filled 2018-06-27 (×9): qty 1

## 2018-06-27 NOTE — Progress Notes (Signed)
PROGRESS NOTE    [name] [name]  OZH:[id number] DOB: 12/27/1965 DOA: 06/06/2018 PCP: Devra Dopp, MD    Brief Narrative: 52 year old man with past medical history relevant for gout, hypertension anterior cervical corpectomy of C4 on 03/24/2018 with comp gated postoperative course including development of multiple PEs and DVTs started on anticoagulation and subsequently developed hematoma requiring reexploration on 03/28/2018 status post placement of IVC filter who was discharged to skilled nursing facility and had a complicated course including progressive lower extremity weakness, intermittent fevers of unclear etiology and hospitalizations for "pneumonia".  Patient was admitted to this hospital is a transfer from [facility] with MRI of C-spine that showed postoperative changes and cord compression with cord edema and concern for ischemia.  Patient is status post posterior cervical decompression laminectomy of C3, C4, C5, removal of spinous processes, foraminotomies on 06/14/2018.  On 06/22/2018 patient began to develop worsening left sided paresthesias and weakness and a stat MRI showed evidence of postoperative hematoma.  Patient was taken to the OR with evacuation of hematoma.  Additionally his postoperative course has been complicated by recurrent fevers, bilateral lower extremity DVTs on ultrasound, migratory arthralgias/arthritis that are thought to be rheumatologic in nature as well as significant orthostasis limiting the amount he can work with physical therapy.   Assessment & Plan:   Principal Problem:   Fever Active Problems:   Myelopathy (HCC)   Benign essential HTN   Cord compression (HCC)   History of pulmonary embolism   Spondylosis, cervical, with myelopathy   Labile blood pressure   Orthostatic hypotension   Leukocytosis  #) Status post cervical decompressive surgery on 06/14/2018: Patient was taken to the OR on 06/22/2018 for new worsening left-sided weakness and  numbness had evacuation of large hematoma with placement of Hemovac drain.  -Neurosurgery following, appreciate recommendations -Continue IV dexamethasone, will defer dosing to neurosurgery -Sliding scale insulin started while on high-dose steroids  #) Mild thrombocytopenia/coagulopathy: Neurosurgery was concerned about her coagulopathy due to recurrent hematomas.  Oncology is following - Cardiolipin antibodies,factor XIII activity, von Willebrand multimers pending -Lupus anticoagulant on 06/23/2018 normal - Homocystine level on 06/23/2018 normal - Oncology following, appreciate recommendations  #) Perioperative DVT/PE: Patient is currently not on anticoagulation due to bleeding.  An IVC filter was placed on 03/28/2018 during prior hospitalization after reexploration of the surgical site due to hematoma was needed. -Vascular DVT ultrasound on 06/26/2018 showed bilateral lower extremity DVTs likely acute -Oncology is considering starting her gastric band pending discussion with general surgery, plan to leave Hemovac in place if her gastric band started  #) Orthostatic hypotension: Unclear etiology.  Differential diagnosis includes dysautonomia from an autoimmune or inflammatory source, central dysautonomia from his surgeries, deconditioning due to laying in bed or some combination of all 3.  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  His inflammatory markers were dramatically elevated.  He did have arthrocentesis performed on 06/09/2018 that was unremarkable.  Per review of the chart ambulatory rheumatology was spoken to and recommended steroids which patient is on for his cervical spinal stenosis and surgery. - Rheumatologic work-up currently  is unremarkable other than mildly elevated CCP  #) Lower extremity edema/grade 2 diastolic dysfunction: Noted on echo on 03/27/2018.  -Judicious fluid use  #) Pain/psych: -Continue gabapentin 300 mg 3  times daily  Fluids: Tolerating p.o. Elect lites: Monitor and supplement Nutrition: Regular diet  Prophylaxis: SCDs, pending discussion of her gastric band  Disposition: Pending ability continued physical therapy and discharge to skilled nursing facility  Full code  Consultants:   Neurosurgery [facility] Orthopedic surgery Infectious disease  Procedures:  On 10/2 1. posterior cervical decompressive laminectomy at C3, C4, C5 with removal of the spinous process and complete laminectomy and foraminotomies of the C3, C4, C5 nerve roots. 2. Posterior cervical fusion with lateral mass screws at C3-C4-C5 and pars screws at C2 lies in the globus ellipseLateral mass screw system 3. Posterior lateral arthrodesis C2-C5 utilizing locally harvested autograft mixed with vivigen  06/22/2018: Procedure reexploration posterior cervical wound for evacuation of epidural hematoma  06/26/2018 DVT ultrasound:Right: Findings consistent with acute deep vein thrombosis involving the right common femoral vein, right femoral vein, right popliteal vein, right posterior tibial vein, and right peroneal vein. Thrombosis extends proximally into external iliac vein.  Proximal iliac vein not visualized. Left: Findings consistent with acute deep vein thrombosis involving the left common femoral vein, left femoral vein, left popliteal vein, left posterior tibial vein, and left peroneal vein. Findings consistent with acute superficial vein thrombosis  involving the left great saphenous vein. Thrombosis extends proximally into external iliac vein. Proximal iliac vein not visualized.   Antimicrobials:   Cefazolin for surgical prophylaxis   Subjective: Patient reports that he is not doing well today  because of the bad news of the DVT ultrasound that was positive.  He is not work with physical therapy yet as he had a "bad day".  He otherwise reports his strength is baseline.  He denies any nausea, vomiting, diarrhea.  He reports his diet has not been great.  Objective: Vitals:   06/26/18 1940 06/27/18 0117 06/27/18 0352 06/27/18 0800  BP: (!) 148/79 133/88 133/84   Pulse: (!) 55 61 (!) 57   Resp: 17 17 18    Temp: 98.2 F (36.8 C) 98.4 F (36.9 C) 98.4 F (36.9 C) 98.1 F (36.7 C)  TempSrc: Oral Oral Oral   SpO2: 100% 97% 98%   Weight:      Height:        Intake/Output Summary (Last 24 hours) at 06/27/2018 1051 Last data filed at 06/27/2018 0700 Gross per 24 hour  Intake 732.31 ml  Output 1620 ml  Net -887.69 ml   Filed Weights   06/06/18 2246 06/14/18 1151 06/22/18 1641  Weight: 123.8 kg 123.8 kg 123.8 kg    Examination:  General exam: Appears calm and comfortable  Respiratory system: Clear to auscultation. Respiratory effort normal. Cardiovascular system: Distant heart sounds, regular rate and rhythm, no murmurs Gastrointestinal system: Abdomen is nondistended, soft and nontender. No organomegaly or masses felt. Normal bowel sounds heard. Central nervous system: Bilateral upper extremity and lower extremity strength is 4+ out of 5, intact sensation, strength is greater on right than left Extremities: 1+ lower extremity edema Skin: Incision site is clean dry and intact, Hemovac in place Psychiatry: Judgement and insight appear normal. Mood & affect appropriate    Data Reviewed: I have personally reviewed following labs and imaging studies  CBC: Recent Labs  Lab 06/23/18 1900 06/24/18 1245 06/25/18 0555 06/26/18 0500 06/27/18 0630  WBC 15.8* 19.0* 11.5* 12.6* 11.9*  NEUTROABS  --   --   --   --  10.1*  HGB 7.3* 9.3* 9.4* 9.6* 9.6*  HCT 23.8* 29.3* 29.6* 31.2* 31.0*  MCV 94.1 91.0 92.8 93.4  95.1  PLT 139* 110* 104* 86* 74*   Basic Metabolic  Panel: Recent Labs  Lab 06/22/18 0310 06/23/18 0400 06/24/18 1245 06/25/18 0555  NA 135 135 133* 134*  K 4.9 4.8 4.6 4.7  CL 102 99 101 100  CO2 24 23 27 27   GLUCOSE 174* 162* 131* 194*  BUN 28* 27* 29* 30*  CREATININE 0.81 0.94 0.84 0.92  CALCIUM 9.3 9.3 9.0 8.9  MG 2.2  --   --   --    GFR: Estimated Creatinine Clearance: 122.1 mL/min (by C-G formula based on SCr of 0.92 mg/dL). Liver Function Tests: Recent Labs  Lab 06/22/18 0310  AST 16  ALT 21  ALKPHOS 58  BILITOT 0.6  PROT 5.7*  ALBUMIN 2.8*   No results for input(s): LIPASE, AMYLASE in the last 168 hours. No results for input(s): AMMONIA in the last 168 hours. Coagulation Profile: Recent Labs  Lab 06/22/18 1950 06/27/18 0630  INR 1.29 1.23   Cardiac Enzymes: No results for input(s): CKTOTAL, CKMB, CKMBINDEX, TROPONINI in the last 168 hours. BNP (last 3 results) No results for input(s): PROBNP in the last 8760 hours. HbA1C: No results for input(s): HGBA1C in the last 72 hours. CBG: Recent Labs  Lab 06/26/18 0755 06/26/18 1155 06/26/18 1612 06/26/18 2130 06/27/18 0725  GLUCAP 213* 169* 214* 172* 153*   Lipid Profile: No results for input(s): CHOL, HDL, LDLCALC, TRIG, CHOLHDL, LDLDIRECT in the last 72 hours. Thyroid Function Tests: No results for input(s): TSH, T4TOTAL, FREET4, T3FREE, THYROIDAB in the last 72 hours. Anemia Panel: No results for input(s): VITAMINB12, FOLATE, FERRITIN, TIBC, IRON, RETICCTPCT in the last 72 hours. Sepsis Labs: No results for input(s): PROCALCITON, LATICACIDVEN in the last 168 hours.  No results found for this or any previous visit (from the past 240 hour(s)).       Radiology Studies: No results found.      Scheduled Meds: . sodium chloride   Intravenous Once  . dexamethasone  4 mg Intravenous Q6H  . febuxostat  40 mg Oral Daily  . fluconazole  200 mg Oral Daily  . furosemide  20 mg Intravenous Once  . furosemide  20 mg Intravenous Once  .  gabapentin  300 mg Oral TID  . Influenza vac split quadrivalent PF  0.5 mL Intramuscular Tomorrow-1000  . insulin aspart  0-5 Units Subcutaneous QHS  . insulin aspart  0-9 Units Subcutaneous TID WC  . methocarbamol  750 mg Oral QID  . midodrine  5 mg Oral TID WC  . nystatin  5 mL Oral TID AC & HS  . pantoprazole  40 mg Oral Daily  . polyethylene glycol  17 g Oral BID  . sodium chloride flush  10-40 mL Intracatheter Q12H  . sodium chloride flush  3 mL Intravenous Q12H  . sodium chloride flush  3 mL Intravenous Q12H   Continuous Infusions: . sodium chloride Stopped (06/21/18 1341)  . sodium chloride 250 mL (06/23/18 1616)  . lactated ringers 10 mL/hr at 06/24/18 1114     LOS: 20 days    Time spent: 35    Delaine Lame, MD [facility]  If 7PM-7AM, please contact night-coverage [email or web address] Password [email or web address] 06/27/2018, 10:51 AM

## 2018-06-27 NOTE — Progress Notes (Addendum)
It looks like Mr. Shock is clearly on the "hypercoagulable side."  His Doppler of his legs shows extensive thrombotic events in both legs.  Thankfully he has the filter in place.  However, I suspect that the filter will get blocked up by blood clots and this will eventually cause significant disability for him in the legs.  I truly believe that the benefit of anticoagulation will outweigh any risk of bleeding.  I cannot find any bleeding problem that he has.  I still cannot explain why he had the postop bleeding from his surgery.  All the studies so far have come back unremarkable.  He does have the thrombocytopenia.  Again it is hard to figure out why he has this.  This is been slowly dropping over the past 4 days.  I have to believe that this is somehow medication induced.  He is on Ancef.  Cephalosporins could definitely cause an immune type of thrombocytopenia.  I would probably try to stop this.  He also is on hydralazine.  This is as needed.  This also can be implicated with thrombocytopenia.  I think that we should consider him for argatroban.  I think this would be a reasonable option for him.  It can be monitored closely.  I would not do any kind of boluses.  He is complaining of a sore throat.  Looks like he may have some thrush in the back of his throat.  I will give him some Diflucan and some nystatin swish and swallow.  Hopefully, it looks like he might be going to inpatient rehab.  I think this would be a great idea as he can be monitored as an inpatient while on anticoagulation.  I know that this is a very "fine line" that we walk.  I still cannot explain why he would have bleeding.  I do believe that he is clearly hypercoagulable.  I will send off a full hypercoagulable panel.  Given the thrombocytopenia, I think that the argatroban infusion would be reasonable and pharmacy can manage this closely.  Hopefully, he will be able to get to inpatient rehab.  [name], MD  [place]  18:14  ADDENDUM: I spoke with Dr. Wynetta Emery of neurosurgery.  He is very leery about getting anticoagulation started right now.  He would feel better about anticoagulation starting in about 3 days.  I think we can wait 3 days.  I know that this is a very tenuous situation that we are dealing with.  I appreciate pharmacy's help with this  I wish that we could fully explain why he had the postop bleeding but yet still has a hypercoagulable situation.  I am not sure if there is anything rheumatologic that might be contributing to this overall picture.  I do not see anything that looks like the antiphospholipid antibody syndrome since his Cardiolite been antibodies are normal in titer.  We will still follow along.  If, there is a change in his status prior to Friday, then we will get anticoagulation started sooner.  We are trying to respect the wishes of neurosurgery since they do not want to have a repeat of bleeding that could potentially cause paralysis for Mr. Vandam.    I do not have any problems with Mr.Sherpa getting out of bed.  I think this would be reasonable for him.  He has the filtering already so I would not think that there would be a problem from the standpoint of lower extremity thrombi traveling to the lung.  [name], MD

## 2018-06-27 NOTE — Progress Notes (Signed)
PT Cancellation Note  Patient Details Name: [name] [name] MRN: [id number] DOB: Aug 28, 1966   Cancelled Treatment:    Reason Eval/Treat Not Completed: Patient not medically ready. RN deferred treatment today due to medical plan still being determined regarding the extending DVTs in bilat LEs. Noted in notes that the filter already in place will most likely get clotted with these current clots. PT to return as able, as appropriate.  Lewis Shock, PT, DPT Acute Rehabilitation Services Pager #: [phone] Office #: [phone]    Iona Hansen 06/27/2018, 8:27 AM

## 2018-06-27 NOTE — Progress Notes (Signed)
OT Cancellation Note  Patient Details Name: Talmage Teaster MRN: [id number] DOB: September 20, 1965   Cancelled Treatment:    Reason Eval/Treat Not Completed: Medical issues which prohibited therapy. RN deferred treatment today due to medical plan still being determined regarding the extending DVTs in bilat LEs. Will return as schedule allows and pt medically ready.  Blaize Epple M Cristiano Capri Cici Rodriges MSOT, OTR/L Acute Rehab Pager: [phone] Office: [phone] 06/27/2018, 8:47 AM

## 2018-06-27 NOTE — Progress Notes (Signed)
Occupational Therapy Treatment Patient Details Name: [name] [name] MRN: [id number] DOB: 10-09-1965 Today's Date: 06/27/2018    History of present illness  52 y.o. male with recent ACDF at C3 C5 by Dr. Wynetta Emery in July 2019 complicated by pulmonary embolism was placed on apixaban subsequently discharged to rehab and then SNF.  Pt underwent posterior cervical decompressive laminectomy, foraminotomies, and cervical fusion C3,C4, C5 06/14/18.  PMH including gout, HTN, and pulmonary embolism. Pt presenting post fall after his legs gave away after getting out of the car. MRI of cervical and lumbar spine showing no change or infection per Dr. Wynetta Emery. MR of right knee showing complex tear of the posterior horn of the medial meniscus. Right knee aspirated on 06/09/18.    OT comments  Upon arrival, pt supine in bed and verbalizing frustration with current functional status. However, pt is motivated to participate in therapy to optimize independence. Monitored BP during each positional change; asymptomatic and denies any dizziness. Pt sitting at EOB with Max A +2 and attempting sit<>stand twice with Total A +2. Continue to recommend dc to CIR and will continue to follow acutely as admitted.    Follow Up Recommendations  CIR;Supervision/Assistance - 24 hour    Equipment Recommendations  None recommended by OT    Recommendations for Other Services Rehab consult    Precautions / Restrictions Precautions Precautions: Fall;Cervical Precaution Comments: Reviewed precautions Required Braces or Orthoses: Cervical Brace Knee Immobilizer - Right: On when out of bed or walking([facility]) Cervical Brace: Hard collar Restrictions Weight Bearing Restrictions: No Other Position/Activity Restrictions: Can bear weight through right leg if brace locked out.(KAFO)       Mobility Bed Mobility Overal bed mobility: Needs Assistance Bed Mobility: Rolling;Supine to Sit Rolling: Mod assist;+2 for physical  assistance Sidelying to sit: Max assist;+2 for physical assistance     Sit to sidelying: Mod assist;+2 for physical assistance General bed mobility comments: HOB inclined to help initiate bed mobility to sitting. Max assist to elevate trunk and lower LE to EOB for bilateral LE support while maintaing safety and stability.  Mod A +2 for returning to supine with assistance for bringing BLEs over EOB  Transfers Overall transfer level: Needs assistance Equipment used: Rolling walker (2 wheeled) Transfers: Sit to/from Stand Sit to Stand: Total assist;+2 physical assistance         General transfer comment: Total assistance +2 to lift hips off bed with use of bedpad. Pt required +2 physical assistance to block knees and provide momentum to power up. Unable to complete full standing position, required verbal and tactile cues to promote hip extension and hand placement to initiate motion. Pt tolerated 2x sit to stand; BP reading: 157/115 after attempted transfer     Balance Overall balance assessment: Needs assistance Sitting-balance support: Feet supported;Bilateral upper extremity supported Sitting balance-Leahy Scale: Fair Sitting balance - Comments: Pt requires UE assist to balance, noted posterior lean in sitting EOB      Standing balance-Leahy Scale: Zero Standing balance comment: reliant on physical A to attempt sit<>stand                           ADL either performed or assessed with clinical judgement   ADL Overall ADL's : Needs assistance/impaired                     Lower Body Dressing: Total assistance Lower Body Dressing Details (indicate cue type and reason): total A to don  brace (in locked position)             Functional mobility during ADLs: Total assistance General ADL Comments: Focused session on positional changes and monitoring BP during changes. Pt abel to sit at EOB and then attempt sit<>stand twice. Requiring TOtal A+2 for sit<>stands      Vision       Perception     Praxis      Cognition Arousal/Alertness: Awake/alert Behavior During Therapy: [facility] for tasks assessed/performed Overall Cognitive Status: Within Functional Limits for tasks assessed                                 General Comments: Pt verbalizing frustration with current functional status and states he is unsure of why therapy is being ordered to evalaute blood pressure changes.         Exercises    Shoulder Instructions       General Comments Blood Pressure Monitoring with Positional Changes. BP Supine 155/117; supine 60 degrees HOB elevated 156/95;  upright in bed 70 degrees HOB elevated and legs lowered 149/92;  EOB 150/113;  Post attempted standing 157/115;  supine HOB 28 degrees at end of session 155/103     Pertinent Vitals/ Pain       Pain Assessment: Faces Faces Pain Scale: Hurts little more Pain Location: neck  Pain Descriptors / Indicators: Grimacing Pain Intervention(s): Monitored during session;Limited activity within patient's tolerance;Repositioned  Home Living                                          Prior Functioning/Environment              Frequency  Min 2X/week        Progress Toward Goals  OT Goals(current goals can now be found in the care plan section)  Progress towards OT goals: Progressing toward goals  Acute Rehab OT Goals Patient Stated Goal: to be able to walk again willing to go slow for therapy  OT Goal Formulation: With patient Time For Goal Achievement: 06/29/18 Potential to Achieve Goals: Good ADL Goals Pt Will Perform Grooming: with min guard assist;standing Pt Will Perform Lower Body Dressing: with min guard assist;sit to/from stand;with adaptive equipment Pt Will Transfer to Toilet: with min guard assist;ambulating;bedside commode Pt Will Perform Toileting - Clothing Manipulation and hygiene: with min guard assist;sit to/from stand;sitting/lateral leans   Plan Discharge plan remains appropriate    Co-evaluation    PT/OT/SLP Co-Evaluation/Treatment: Yes Reason for Co-Treatment: For patient/therapist safety;To address functional/ADL transfers PT goals addressed during session: Mobility/safety with mobility;Other (comment)(Positional Blood Pressure Monitoring) OT goals addressed during session: ADL's and self-care      AM-PAC PT "6 Clicks" Daily Activity     Outcome Measure   Help from another person eating meals?: None Help from another person taking care of personal grooming?: A Little Help from another person toileting, which includes using toliet, bedpan, or urinal?: Total Help from another person bathing (including washing, rinsing, drying)?: A Lot Help from another person to put on and taking off regular upper body clothing?: A Lot Help from another person to put on and taking off regular lower body clothing?: Total 6 Click Score: 13    End of Session Equipment Utilized During Treatment: Other (comment)(Right knee brace in extension)  OT Visit Diagnosis: Unsteadiness  on feet (R26.81);Pain;Muscle weakness (generalized) (M62.81) Pain - Right/Left: Right Pain - part of body: Knee   Activity Tolerance Patient tolerated treatment well;Patient limited by fatigue   Patient Left in bed;with call bell/phone within reach;with bed alarm set   Nurse Communication Mobility status;Precautions;Weight bearing status        Time: 1610-9604 OT Time Calculation (min): 41 min  Charges: OT General Charges $OT Visit: 1 Visit OT Treatments $Self Care/Home Management : 8-22 mins $Therapeutic Activity: 8-22 mins  Demarkis Gheen MSOT, OTR/L Acute Rehab Pager: [phone] Office: [phone]   Theodoro Grist Shynice Sigel 06/27/2018, 4:44 PM

## 2018-06-27 NOTE — Progress Notes (Signed)
Physical Therapy Treatment Patient Details Name: [name] [name] MRN: [id number] DOB: 1965/12/05 Today's Date: 06/27/2018    History of Present Illness  52 y.o. male with recent ACDF at C3 C5 by Dr. Wynetta Emery in July 2019 complicated by pulmonary embolism was placed on apixaban subsequently discharged to rehab and then SNF.  Pt underwent posterior cervical decompressive laminectomy, foraminotomies, and cervical fusion C3,C4, C5 06/14/18.  PMH including gout, HTN, and pulmonary embolism. Pt presenting post fall after his legs gave away after getting out of the car. MRI of cervical and lumbar spine showing no change or infection per Dr. Wynetta Emery. MR of right knee showing complex tear of the posterior horn of the medial meniscus. Right knee aspirated on 06/09/18.     PT Comments    Pt agreeable to treatment, states frustration with his current status but is motivated to work on activity limitations due to variable blood pressure changes. Pt performed bed mobility, and sit to stand transfers attempts with blood pressure monitored and recorded at each position with no reported dizziness throughout therapy. PT recommendations and plan of care remains appropriate.   Follow Up Recommendations  CIR     Equipment Recommendations       Recommendations for Other Services       Precautions / Restrictions Precautions Precautions: Fall;Cervical Required Braces or Orthoses: Cervical Brace Knee Immobilizer - Right: On when out of bed or walking([facility]) Cervical Brace: Hard collar Restrictions Weight Bearing Restrictions: No Other Position/Activity Restrictions: Can bear weight through right leg if brace locked out.(KAFO)    Mobility  Bed Mobility Overal bed mobility: Needs Assistance Bed Mobility: Rolling;Supine to Sit Rolling: Mod assist;+2 for physical assistance Sidelying to sit: Mod assist;+2 for physical assistance       General bed mobility comments: HOB inclined to help initiate bed mobility to  sitting. Mod assist to elevate trunk and lower LE to EOB for bilateral LE support while maintaing safety and stability.    Transfers Overall transfer level: Needs assistance Equipment used: Rolling walker (2 wheeled) Transfers: Sit to/from Stand Sit to Stand: Total assist; +2 physical assistance         General transfer comment: Total assistance +2 necessary for power up to standing. Pt required +2 physical assistance to block knees and provide momentum to power up, use of chuck pad to elevate hips in attempts from sit to stand. Unable to complete full standing position, required verbal and tactile cues to promote hip extension and hand placement to initiate motion. Pt tolerated 2x sit to stand; BP reading: 157/115   Ambulation/Gait             General Gait Details: NT   Stairs             Wheelchair Mobility    Modified Rankin (Stroke Patients Only)       Balance Overall balance assessment: Needs assistance Sitting-balance support: Feet supported;Bilateral upper extremity supported Sitting balance-Leahy Scale: Fair Sitting balance - Comments: Pt requires UE assist to balance, noted posterior lean in sitting EOB                                     Cognition Arousal/Alertness: Awake/alert Behavior During Therapy: WFL for tasks assessed/performed Overall Cognitive Status: Within Functional Limits for tasks assessed  General Comments: Pt explains frustration with treatments and states he is unsure of why therapy is being ordered to evalaute blood pressure changes       Exercises Other Exercises Other Exercises: Blood Pressure Monitoring with Positional Changes; BP Supine: 155/117, BP 60 degrees HOB elevated: 156/95, BP 70 degrees HOB elevated: 149/92, BP EOB: 150/113, BP Post Standing: 157/115, BP supine HOB 28 degrees: 155/103     General Comments        Pertinent Vitals/Pain Pain Assessment:  Faces Faces Pain Scale: Hurts little more Pain Location: neck  Pain Descriptors / Indicators: Grimacing Pain Intervention(s): Monitored during session    Home Living                      Prior Function            PT Goals (current goals can now be found in the care plan section) Acute Rehab PT Goals Patient Stated Goal: to be able to walk again willing to go slow for therapy  PT Goal Formulation: With patient Time For Goal Achievement: 07/11/18 Potential to Achieve Goals: Good Progress towards PT goals: Progressing toward goals    Frequency    Min 5X/week      PT Plan Current plan remains appropriate    Co-evaluation PT/OT/SLP Co-Evaluation/Treatment: Yes Reason for Co-Treatment: For patient/therapist safety;Complexity of the patient's impairments (multi-system involvement) PT goals addressed during session: Mobility/safety with mobility;Other (comment)(Positional Blood Pressure Monitoring)        AM-PAC PT "6 Clicks" Daily Activity  Outcome Measure  Difficulty turning over in bed (including adjusting bedclothes, sheets and blankets)?: Unable Difficulty moving from lying on back to sitting on the side of the bed? : Unable Difficulty sitting down on and standing up from a chair with arms (e.g., wheelchair, bedside commode, etc,.)?: Unable Help needed moving to and from a bed to chair (including a wheelchair)?: A Lot Help needed walking in hospital room?: Total Help needed climbing 3-5 steps with a railing? : Total 6 Click Score: 7    End of Session Equipment Utilized During Treatment: Gait belt;Right knee immobilizer;Cervical collar Activity Tolerance: Patient tolerated treatment well(limited by weakness ) Patient left: in bed;with call bell/phone within reach(28 degress HOB) Nurse Communication: Mobility status PT Visit Diagnosis: Other abnormalities of gait and mobility (R26.89);Other symptoms and signs involving the nervous system (R29.898);Muscle  weakness (generalized) (M62.81)     Time: 1610-9604 PT Time Calculation (min) (ACUTE ONLY): 50 min  Charges:  $Therapeutic Activity: 8-22 mins                     Jacklyn Shell, SPT    [name] [name] 06/27/2018, 4:23 PM

## 2018-06-27 NOTE — Progress Notes (Addendum)
NEUROSURGERY PROGRESS NOTE  Doing ok. Complains of appropriate neck soreness at times with different positions.  States that he had a rough day yesterday after finding out he had bilateral DVTs again.  Incision CDI  Temp:  [97.9 F (36.6 C)-98.5 F (36.9 C)] 98.1 F (36.7 C) (10/15 0800) Pulse Rate:  [55-70] 57 (10/15 0352) Resp:  [15-18] 18 (10/15 0352) BP: (133-148)/(79-91) 133/84 (10/15 0352) SpO2:  [97 %-100 %] 98 % (10/15 0352)  Plan: Considering starting argatroban at a very low dose per hematology? Will discuss risk/benefits with Dr. Myna Hidalgo first. May d/c hemovac drain today pending whether we start argatroban today or not.   Sherryl Manges, NP 06/27/2018 9:27 AM

## 2018-06-27 NOTE — Progress Notes (Signed)
ANTICOAGULATION CONSULT NOTE - Initial Consult  Pharmacy Consult for Argatroban Indication: DVT  Allergies  Allergen Reactions  . Nsaids Other (See Comments)    G6PD DEFICIENCY  . Sulfa Antibiotics Other (See Comments)    G6PD DEFICIENCY  . Protonix [Pantoprazole Sodium] Diarrhea    Severe diarrhea, Upset stomach    Patient Measurements: Height: 5\' 9"  (175.3 cm) Weight: 272 lb 14.9 oz (123.8 kg) IBW/kg (Calculated) : 70.7  Vital Signs: Temp: 98.4 F (36.9 C) (10/15 0352) Temp Source: Oral (10/15 0352) BP: 133/84 (10/15 0352) Pulse Rate: 57 (10/15 0352)  Labs: Recent Labs    06/24/18 1245 06/25/18 0555 06/26/18 0500  HGB 9.3* 9.4* 9.6*  HCT 29.3* 29.6* 31.2*  PLT 110* 104* 86*  CREATININE 0.84 0.92  --     Estimated Creatinine Clearance: 122.1 mL/min (by C-G formula based on SCr of 0.92 mg/dL).   Medical History: Past Medical History:  Diagnosis Date  . Degenerative disc disease, lumbar   . Gout   . History of pernicious anemia 1980   Patient says he takes OTC iron supplements QD  . Hypertension     Medications:  Scheduled:  . sodium chloride   Intravenous Once  . dexamethasone  4 mg Intravenous Q6H  . febuxostat  40 mg Oral Daily  . fluconazole  200 mg Oral Daily  . furosemide  20 mg Intravenous Once  . furosemide  20 mg Intravenous Once  . gabapentin  300 mg Oral TID  . Influenza vac split quadrivalent PF  0.5 mL Intramuscular Tomorrow-1000  . insulin aspart  0-5 Units Subcutaneous QHS  . insulin aspart  0-9 Units Subcutaneous TID WC  . methocarbamol  750 mg Oral QID  . midodrine  5 mg Oral TID WC  . nystatin  5 mL Oral TID AC & HS  . pantoprazole  40 mg Oral Daily  . polyethylene glycol  17 g Oral BID  . sodium chloride flush  10-40 mL Intracatheter Q12H  . sodium chloride flush  3 mL Intravenous Q12H  . sodium chloride flush  3 mL Intravenous Q12H    Assessment: 52 y.o. male  s/p cervical fusion 10/2 and epidural hematoma evacuation 10/10,  now with bilateral DVTs and thrombocytopenia, for argatroban  Goal of Therapy:  aPTT 50-90 seconds Monitor platelets by anticoagulation protocol: Yes   Plan:  Start argatroban 1 mcg/kg/min APTT in 2 hours  Loren Sawaya, Gary Fleet 06/27/2018,7:32 AM

## 2018-06-28 DIAGNOSIS — G959 Disease of spinal cord, unspecified: Secondary | ICD-10-CM

## 2018-06-28 LAB — PROTEIN S ACTIVITY: Protein S Activity: 60 % — ABNORMAL LOW (ref 63–140)

## 2018-06-28 LAB — GLUCOSE, CAPILLARY
GLUCOSE-CAPILLARY: 165 mg/dL — AB (ref 70–99)
Glucose-Capillary: 184 mg/dL — ABNORMAL HIGH (ref 70–99)
Glucose-Capillary: 165 mg/dL — ABNORMAL HIGH (ref 70–99)

## 2018-06-28 LAB — CBC WITH DIFFERENTIAL/PLATELET
Abs Immature Granulocytes: 0.19 10*3/uL — ABNORMAL HIGH (ref 0.00–0.07)
Basophils Absolute: 0 10*3/uL (ref 0.0–0.1)
Basophils Relative: 0 %
EOS ABS: 0 10*3/uL (ref 0.0–0.5)
EOS PCT: 0 %
HEMATOCRIT: 30.9 % — AB (ref 39.0–52.0)
HEMOGLOBIN: 9.1 g/dL — AB (ref 13.0–17.0)
IMMATURE GRANULOCYTES: 1 %
LYMPHS ABS: 1 10*3/uL (ref 0.7–4.0)
LYMPHS PCT: 7 %
MCH: 28.3 pg (ref 26.0–34.0)
MCHC: 29.4 g/dL — AB (ref 30.0–36.0)
MCV: 96.3 fL (ref 80.0–100.0)
Monocytes Absolute: 0.7 10*3/uL (ref 0.1–1.0)
Monocytes Relative: 5 %
NEUTROS PCT: 87 %
Neutro Abs: 11.6 10*3/uL — ABNORMAL HIGH (ref 1.7–7.7)
Platelets: 65 10*3/uL — ABNORMAL LOW (ref 150–400)
RBC: 3.21 MIL/uL — AB (ref 4.22–5.81)
RDW: 16.7 % — AB (ref 11.5–15.5)
WBC: 13.5 10*3/uL — AB (ref 4.0–10.5)
nRBC: 0.1 % (ref 0.0–0.2)

## 2018-06-28 LAB — LUPUS ANTICOAGULANT PANEL
DRVVT: 33.6 s (ref 0.0–47.0)
PTT Lupus Anticoagulant: 27.2 s (ref 0.0–51.9)

## 2018-06-28 LAB — CARDIOLIPIN ANTIBODIES, IGG, IGM, IGA
Anticardiolipin IgA: <9 APL U/mL (ref 0–11)
Anticardiolipin IgG: <9 GPL U/mL (ref 0–14)
Anticardiolipin IgM: <9 MPL U/mL (ref 0–12)

## 2018-06-28 LAB — APTT: APTT: 27 s (ref 24–36)

## 2018-06-28 LAB — HOMOCYSTEINE: HOMOCYSTEINE-NORM: 13.9 umol/L (ref 0.0–15.0)

## 2018-06-28 LAB — PROTEIN S, TOTAL: Protein S Ag, Total: 131 % (ref 60–150)

## 2018-06-28 LAB — PROTEIN C ACTIVITY: Protein C Activity: >199 % — ABNORMAL HIGH (ref 73–180)

## 2018-06-28 MED ORDER — DOCUSATE SODIUM 100 MG PO CAPS
100.0000 mg | ORAL_CAPSULE | Freq: Every day | ORAL | Status: DC
  Administered 2018-06-28 – 2018-07-22 (×10): 100 mg via ORAL
  Filled 2018-06-28 (×16): qty 1

## 2018-06-28 MED ORDER — SENNOSIDES-DOCUSATE SODIUM 8.6-50 MG PO TABS
1.0000 | ORAL_TABLET | Freq: Every day | ORAL | Status: DC
  Administered 2018-06-28 – 2018-07-23 (×3): 1 via ORAL
  Filled 2018-06-28 (×14): qty 1

## 2018-06-28 MED ORDER — BISACODYL 5 MG PO TBEC
5.0000 mg | DELAYED_RELEASE_TABLET | Freq: Every day | ORAL | Status: DC | PRN
  Administered 2018-07-02: 5 mg via ORAL
  Filled 2018-06-28: qty 1

## 2018-06-28 MED ORDER — DEXAMETHASONE SODIUM PHOSPHATE 4 MG/ML IJ SOLN
2.0000 mg | Freq: Four times a day (QID) | INTRAMUSCULAR | Status: DC
  Administered 2018-06-28 – 2018-06-30 (×10): 2 mg via INTRAVENOUS
  Filled 2018-06-28 (×11): qty 1

## 2018-06-28 NOTE — Progress Notes (Signed)
PROGRESS NOTE    [name] [name]  ZOX:[id number] DOB: May 31, 1966 DOA: 06/06/2018 PCP: Devra Dopp, MD   Brief Narrative:  52 year old man with past medical history relevant for gout, hypertension anterior cervical corpectomy of C4 on 03/24/2018 with comp gated postoperative course including development of multiple PEs and DVTs started on anticoagulation and subsequently developed hematoma requiring reexploration on 03/28/2018 status post placement of IVC filter who was discharged to skilled nursing facility and had a complicated course including progressive lower extremity weakness, intermittent fevers of unclear etiology and hospitalizations for "pneumonia".  Patient was admitted to this hospital is a transfer from [facility] with MRI of C-spine that showed postoperative changes and cord compression with cord edema and concern for ischemia.  Patient is status post posterior cervical decompression laminectomy of C3, C4, C5, removal of spinous processes, foraminotomies on 06/14/2018.  On 06/22/2018 patient began to develop worsening left sided paresthesias and weakness and a stat MRI showed evidence of postoperative hematoma.  Patient was taken to the OR with evacuation of hematoma.  Additionally his postoperative course has been complicated by recurrent fevers, bilateral lower extremity DVTs on ultrasound, migratory arthralgias/arthritis that are thought to be rheumatologic in nature as well as significant orthostasis limiting the amount he can work with physical therapy.  Assessment & Plan   Near syncope due to orthostatic hypotension -Likely combination of hypovolemia as well as autonomic dysfunction -Patient was given IV fluids and placed on TED hose -He eventually was started on midodrine -Echocardiogram July 2019 showed a normal systolic function, grade 2 diastolic dysfunction.  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 -Neurosurgery consulted and appreciated -Patient was noted to have left-sided weakness and numbness -Status post cervical decompressive surgery on 06/14/2018 -Status post evacuation of large hematoma and placement of Hemovac drain on 06/22/2018 -Continue IV dexamethasone per neurosurgery -Pending SNF vs CIR  Thrombocytopenia/coagulopathy -Neurosurgery concerned about coagulopathy due to recurrent hematomas -Hematology oncology consulted and appreciated -Cardiolipin antibody, factor VIII activity, von Willebrand pending -Lupus anticoagulant, homocysteine level within normal limits -Oncology recommending argatroban   Polyarthralgia/persistent fever -Patient had symptoms in the shoulders and ankles, along with a history of gout and pain in his knees.  He underwent arthrocentesis however no evidence of infection was found. -He was noted to have elevated inflammatory markers including CCP, CRP.  However ANCA, aldolase, ANA were negative. -Previous rounding physician discussed with Dr. Deanne Coffer, rheumatologist, he will follow-up with the patient as an outpatient. -Patient was placed on prednisone however currently on dexamethasone (neurosurgery) -No longer having fevers  Cough -Resolved  Mild acute renal failure -resolved  Recent PE in setting of spine surgery -Status post IVC filter placement on March 28, 2018 -Patient was on apixaban however changed over to IV heparin.  Anticoagulation however was discontinued for surgery.  Patient also noted to have thrombocytopenia. -Vascular DVT US 06/26/2018 showed bilateral lower extremity DVTs likely acute -Hematology consulted and appreciated, recommended Argatroban.  However neurosurgery is wanting to hold any type of anticoagulation for an additional 3 days  Essential hypertension -Patient was on amlodipine however was noted to have lower extremity edema and amlodipine was discontinued.  He was also noted to have orthostatic hypotension as  noted above.  Gout -Does not seem to be active at this time, continue Uloric  Normocytic anemia -Currently 9.1 appears to be stable, continue to monitor CBC  DVT Prophylaxis  SCDs  Code Status: Full  Family Communication: None at bedside.  Disposition Plan: Admitted. Dispo pending (either CIR vs SNF).   Consultants Neurosurgery Orthopedic surgery Infectious disease  Inpatient rehab  Procedures  On 10/2 1. posterior cervical decompressive laminectomy at C3, C4, C5 with removal of the spinous process and complete laminectomy and foraminotomies of the C3, C4, C5 nerve roots. 2. Posterior cervical fusion with lateral mass screws at C3-C4-C5 and pars screws at C2 lies in the globus ellipseLateral mass screw system 3. Posterior lateral arthrodesis C2-C5 utilizing locally harvested autograft mixed with vivigen  06/22/2018: Procedure reexploration posterior cervical wound for evacuation of epidural hematoma  06/26/2018 DVT ultrasound:Right: Findings consistent with acute deep vein thrombosis involving the right common femoral vein, right femoral vein, right popliteal vein, right posterior tibial vein, and right peroneal vein. Thrombosis extends proximally into external iliac vein.  Proximal iliac vein not visualized. Left: Findings consistent with acute deep vein thrombosis involving the left common femoral vein, left femoral vein, left popliteal vein, left posterior tibial vein, and left peroneal vein. Findings consistent with acute superficial vein thrombosis  involving the left great saphenous vein. Thrombosis extends proximally into external iliac vein. Proximal iliac vein not visualized.  Antibiotics   Anti-infectives (From admission, onward)   Start     Dose/Rate Route Frequency Ordered Stop   06/27/18 1000  fluconazole (DIFLUCAN) tablet 200 mg     200 mg Oral Daily 06/27/18 0725     06/22/18 2315  ceFAZolin (ANCEF) IVPB 2g/100 mL premix     2 g 200 mL/hr over 30  Minutes Intravenous Every 8 hours 06/22/18 2314 06/27/18 1519   06/22/18 1828  bacitracin 50,000 Units in sodium chloride 0.9 % 500 mL irrigation  Status:  Discontinued       As needed 06/22/18 1828 06/22/18 1915   06/22/18 1700  ceFAZolin (ANCEF) 3 g in dextrose 5 % 50 mL IVPB  Status:  Discontinued     3 g 100 mL/hr over 30 Minutes Intravenous  Once 06/22/18 1647 06/22/18 2101   06/14/18 2100  ceFAZolin (ANCEF) IVPB 2g/100 mL premix     2 g 200 mL/hr over 30 Minutes Intravenous Every 8 hours 06/14/18 1712 06/17/18 0800   06/14/18 1414  bacitracin 50,000 Units in sodium chloride 0.9 % 500 mL irrigation  Status:  Discontinued       As needed 06/14/18 1414 06/14/18 1647   06/14/18 1215  ceFAZolin (ANCEF) 3 g in dextrose 5 % 50 mL IVPB     3 g 160 mL/hr over 30 Minutes Intravenous To [facility] 06/14/18 1202 06/14/18 2225   06/14/18 1200  ceFAZolin (ANCEF) IVPB 2g/100 mL premix  Status:  Discontinued     2 g 200 mL/hr over 30 Minutes Intravenous On call to O.R. 06/14/18 1157 06/14/18 2140      Subjective:   [name] [name] seen and examined today.  Wishes he knew what was going on with him.  Denies current chest pain, shortness of breath, abdominal pain, nausea vomiting, diarrhea constipation, dizziness or headache.  Objective:   Vitals:   06/27/18 2321 06/28/18 0320 06/28/18 0732 06/28/18 1215  BP: (!) 147/93  (!) 144/90 (!) 145/99  Pulse: 70  66 (!) 57  Resp: 20  15 17   Temp: 99 F (37.2 C) 98.2 F (36.8 C) 98.1 F (36.7 C) 98.8 F (37.1 C)  TempSrc:  Oral Oral Oral  SpO2: 100%  98% 99%  Weight:      Height:        Intake/Output Summary (Last 24 hours)  at 06/28/2018 1443 Last data filed at 06/28/2018 1228 Gross per 24 hour  Intake 720 ml  Output 1775 ml  Net -1055 ml   Filed Weights   06/06/18 2246 06/14/18 1151 06/22/18 1641  Weight: 123.8 kg 123.8 kg 123.8 kg    Exam  General: Well developed, well nourished, obese, NAD  HEENT: NCAT, mucous membranes  moist.   Cardiovascular: S1 S2 auscultated, no rubs, murmurs or gallops. Regular rate and rhythm.  Respiratory: Clear to auscultation bilaterally with equal chest rise  Abdomen: Soft, obese, nontender, nondistended, + bowel sounds  Extremities: warm dry without cyanosis clubbing. Trace LE edema.   Neuro: AAOx3, nonfocal, strength greater on R>L   Psych: Normal affect and demeanor with intact judgement and insight   Data Reviewed: I have personally reviewed following labs and imaging studies  CBC: Recent Labs  Lab 06/24/18 1245 06/25/18 0555 06/26/18 0500 06/27/18 0630 06/28/18 0500  WBC 19.0* 11.5* 12.6* 11.9* 13.5*  NEUTROABS  --   --   --  10.1* 11.6*  HGB 9.3* 9.4* 9.6* 9.6* 9.1*  HCT 29.3* 29.6* 31.2* 31.0* 30.9*  MCV 91.0 92.8 93.4 95.1 96.3  PLT 110* 104* 86* 74* 65*   Basic Metabolic Panel: Recent Labs  Lab 06/22/18 0310 06/23/18 0400 06/24/18 1245 06/25/18 0555  NA 135 135 133* 134*  K 4.9 4.8 4.6 4.7  CL 102 99 101 100  CO2 24 23 27 27   GLUCOSE 174* 162* 131* 194*  BUN 28* 27* 29* 30*  CREATININE 0.81 0.94 0.84 0.92  CALCIUM 9.3 9.3 9.0 8.9  MG 2.2  --   --   --    GFR: Estimated Creatinine Clearance: 122.1 mL/min (by C-G formula based on SCr of 0.92 mg/dL). Liver Function Tests: Recent Labs  Lab 06/22/18 0310  AST 16  ALT 21  ALKPHOS 58  BILITOT 0.6  PROT 5.7*  ALBUMIN 2.8*   No results for input(s): LIPASE, AMYLASE in the last 168 hours. No results for input(s): AMMONIA in the last 168 hours. Coagulation Profile: Recent Labs  Lab 06/22/18 1950 06/27/18 0630  INR 1.29 1.23   Cardiac Enzymes: No results for input(s): CKTOTAL, CKMB, CKMBINDEX, TROPONINI in the last 168 hours. BNP (last 3 results) No results for input(s): PROBNP in the last 8760 hours. HbA1C: No results for input(s): HGBA1C in the last 72 hours. CBG: Recent Labs  Lab 06/27/18 1155 06/27/18 1521 06/27/18 2150 06/28/18 0734 06/28/18 1227  GLUCAP 148* 196* 173*  184* 165*   Lipid Profile: No results for input(s): CHOL, HDL, LDLCALC, TRIG, CHOLHDL, LDLDIRECT in the last 72 hours. Thyroid Function Tests: No results for input(s): TSH, T4TOTAL, FREET4, T3FREE, THYROIDAB in the last 72 hours. Anemia Panel: No results for input(s): VITAMINB12, FOLATE, FERRITIN, TIBC, IRON, RETICCTPCT in the last 72 hours. Urine analysis:    Component Value Date/Time   COLORURINE AMBER (A) 06/07/2018 0900   APPEARANCEUR CLOUDY (A) 06/07/2018 0900   LABSPEC 1.028 06/07/2018 0900   PHURINE 5.0 06/07/2018 0900   GLUCOSEU NEGATIVE 06/07/2018 0900   HGBUR SMALL (A) 06/07/2018 0900   BILIRUBINUR NEGATIVE 06/07/2018 0900   KETONESUR 5 (A) 06/07/2018 0900   PROTEINUR 30 (A) 06/07/2018 0900   NITRITE NEGATIVE 06/07/2018 0900   LEUKOCYTESUR NEGATIVE 06/07/2018 0900   Sepsis Labs: @LABRCNTIP (procalcitonin:4,lacticidven:4)  )No results found for this or any previous visit (from the past 240 hour(s)).    Radiology Studies: No results found.   Scheduled Meds: . sodium chloride   Intravenous Once  .  dexamethasone  2 mg Intravenous Q6H  . docusate sodium  100 mg Oral Daily  . febuxostat  40 mg Oral Daily  . fluconazole  200 mg Oral Daily  . furosemide  20 mg Intravenous Once  . furosemide  20 mg Intravenous Once  . gabapentin  300 mg Oral TID  . Influenza vac split quadrivalent PF  0.5 mL Intramuscular Tomorrow-1000  . insulin aspart  0-5 Units Subcutaneous QHS  . insulin aspart  0-9 Units Subcutaneous TID WC  . methocarbamol  750 mg Oral QID  . midodrine  5 mg Oral TID WC  . nystatin  5 mL Oral TID AC & HS  . pantoprazole  40 mg Oral Daily  . polyethylene glycol  17 g Oral BID  . senna-docusate  1 tablet Oral QHS  . sodium chloride flush  10-40 mL Intracatheter Q12H  . sodium chloride flush  3 mL Intravenous Q12H   Continuous Infusions: . sodium chloride 250 mL (06/23/18 1616)  . lactated ringers 10 mL/hr at 06/24/18 1114     LOS: 21 days   Time Spent  in minutes   30 minutes  Didi Ganaway D.O. on 06/28/2018 at 2:43 PM  Between 7am to 7pm - Please see pager noted on [email or web address]  After 7pm go to [email or web address]  And look for the night coverage person covering for me after hours  [facility] Office  [phone]

## 2018-06-28 NOTE — Progress Notes (Signed)
NEUROSURGERY PROGRESS NOTE  Doing well. In better spirits today. Discussed his plan of care for the week with him as far as starting the argatroban on Friday and upon discharge he will likely go to CIR. Incision CDI. Strength upper extremities 4+/5. States he has not had a bowel movement for 3 days. Ordered schedule senokot and colace as well as dulcolax PRN.   Temp:  [98.1 F (36.7 C)-99 F (37.2 C)] 98.1 F (36.7 C) (10/16 0732) Pulse Rate:  [66-124] 66 (10/16 0732) Resp:  [13-22] 15 (10/16 0732) BP: (136-157)/(80-115) 144/90 (10/16 0732) SpO2:  [97 %-100 %] 98 % (10/16 0732)    Sherryl Manges, NP 06/28/2018 11:53 AM

## 2018-06-29 DIAGNOSIS — I82421 Acute embolism and thrombosis of right iliac vein: Secondary | ICD-10-CM

## 2018-06-29 LAB — CBC WITH DIFFERENTIAL/PLATELET
Abs Immature Granulocytes: 0.2 10*3/uL — ABNORMAL HIGH (ref 0.00–0.07)
BASOS ABS: 0 10*3/uL (ref 0.0–0.1)
Basophils Relative: 0 %
EOS PCT: 0 %
Eosinophils Absolute: 0 10*3/uL (ref 0.0–0.5)
HEMATOCRIT: 31.3 % — AB (ref 39.0–52.0)
Hemoglobin: 9.3 g/dL — ABNORMAL LOW (ref 13.0–17.0)
Immature Granulocytes: 2 %
LYMPHS ABS: 1.1 10*3/uL (ref 0.7–4.0)
LYMPHS PCT: 8 %
MCH: 28.6 pg (ref 26.0–34.0)
MCHC: 29.7 g/dL — AB (ref 30.0–36.0)
MCV: 96.3 fL (ref 80.0–100.0)
MONO ABS: 0.8 10*3/uL (ref 0.1–1.0)
MONOS PCT: 6 %
Neutro Abs: 10.8 10*3/uL — ABNORMAL HIGH (ref 1.7–7.7)
Neutrophils Relative %: 84 %
Platelets: 65 10*3/uL — ABNORMAL LOW (ref 150–400)
RBC: 3.25 MIL/uL — ABNORMAL LOW (ref 4.22–5.81)
RDW: 16.7 % — ABNORMAL HIGH (ref 11.5–15.5)
WBC: 12.8 10*3/uL — ABNORMAL HIGH (ref 4.0–10.5)
nRBC: 0 % (ref 0.0–0.2)

## 2018-06-29 LAB — GLUCOSE, CAPILLARY
GLUCOSE-CAPILLARY: 223 mg/dL — AB (ref 70–99)
GLUCOSE-CAPILLARY: 163 mg/dL — AB (ref 70–99)
GLUCOSE-CAPILLARY: 191 mg/dL — AB (ref 70–99)
Glucose-Capillary: 197 mg/dL — ABNORMAL HIGH (ref 70–99)
Glucose-Capillary: 165 mg/dL — ABNORMAL HIGH (ref 70–99)

## 2018-06-29 LAB — BETA-2-GLYCOPROTEIN I ABS, IGG/M/A
Beta-2-Glycoprotein I IgA: <9 GPI IgA units (ref 0–25)
Beta-2-Glycoprotein I IgM: <9 GPI IgM units (ref 0–32)

## 2018-06-29 LAB — PROTEIN C, TOTAL

## 2018-06-29 LAB — APTT: aPTT: 25 seconds (ref 24–36)

## 2018-06-29 NOTE — Progress Notes (Signed)
Hematology follow-up note  Subjective: Patient has extensive and complicated medical history that includes multiple PEs and DVTs since July 2019.  Because of his extensive problems with his neck where he had surgeries that included a laminectomy and removal of postoperative hematoma, anticoagulation has been discontinued.  Neurosurgery recommended waiting until tomorrow to begin anticoagulation.  Hypercoagulability work-up available so far has been negative.  However his nonambulatory nature and obesity and inability to anticoagulate are probably contributing to worsening DVTs.  Meanwhile his major concerns are 1.  Leg swelling that does not appear to could be coming down with Lasix, most likely due to worsening DVTs 2. intermittent hypotension 3.  Lack of answers regarding the cause of his blood clots  Exam: Lower extremities: Markedly swollen Neck: Wearing a cervical collar Psych: Patient is in tears regarding his situation and lack of answers He does not feel safe to participate in physical therapy until we have answers to why his blood pressure appears to be dropping.  CBC Latest Ref Rng & Units 06/29/2018 06/28/2018 06/27/2018  WBC 4.0 - 10.5 K/uL 12.8(H) 13.5(H) 11.9(H)  Hemoglobin 13.0 - 17.0 g/dL 1.6(X) 0.9(U) 0.4(V)  Hematocrit 39.0 - 52.0 % 31.3(L) 30.9(L) 31.0(L)  Platelets 150 - 400 K/uL 65(L) 65(L) 74(L)    CMP Latest Ref Rng & Units 06/25/2018 06/24/2018 06/23/2018  Glucose 70 - 99 mg/dL 409(W) 119(J) 478(G)  BUN 6 - 20 mg/dL 95(A) 21(H) 08(M)  Creatinine 0.61 - 1.24 mg/dL 5.78 4.69 6.29  Sodium 135 - 145 mmol/L 134(L) 133(L) 135  Potassium 3.5 - 5.1 mmol/L 4.7 4.6 4.8  Chloride 98 - 111 mmol/L 100 101 99  CO2 22 - 32 mmol/L 27 27 23   Calcium 8.9 - 10.3 mg/dL 8.9 9.0 9.3  Total Protein 6.5 - 8.1 g/dL - - -  Total Bilirubin 0.3 - 1.2 mg/dL - - -  Alkaline Phos 38 - 126 U/L - - -  AST 15 - 41 U/L - - -  ALT 0 - 44 U/L - - -    Recommendation: 1. Extensive DVTs: Once  neurosurgery clears the patient, reinitiate anticoagulation with argatroban.  I would like HIT antibody panel to be run. Hypercoagulability test results so far are negative. Until his DVTs get better, there is no way his leg swelling will improve. I recommended getting vascular surgery consultation as well. He has an IVC filter currently.  2. thrombocytopenia: I suspect this is related to increased consumption from his extensive blood clots. We will obtain immature platelet fraction to assess the cause of thrombocytopenia whether it is consumption versus decreased production.  3. I tried to reassure the patient that we are all working together to figure out his complex medical situation.   We will try to follow him regularly

## 2018-06-29 NOTE — Plan of Care (Signed)

## 2018-06-29 NOTE — Progress Notes (Signed)
Handoff from night nurse now complete. 

## 2018-06-29 NOTE — Progress Notes (Signed)
Physical Therapy Treatment Patient Details Name: [name] [name] MRN: [id number] DOB: Aug 30, 1966 Today's Date: 06/29/2018    History of Present Illness 52 y.o. male admitted on 06/06/18 for bil LE wekness and muscle spasm after multiple falls (with resultant R knee meniscus tear placed in bledsoe brace) following a recent ACDF at C3 in July 2019 (complicated by PE (s/p IVC fileter), CIR, and then SNF placement before finally returning home with his parents).  In the ED MRI of c-spine which showed post operative changes and cord edema and possibility of ischemia.  Pt also running low grade fevers without any clear source. His R knee was aspirated on 06/09/18.  Pt underwent posterior cervical decompression and fusion on 06/14/18.  Pt had to have re-exploration of posterior cervical wound for evacuation of epidural hematoma and placement of hemovac on 06/22/18.  Entire acute course complicated by hypotension in standing (thought to be autonomic) and by finding of multiple LE DVTs (already has an IVC filter in place, but per MDs notes at risk for blocking the filter with these clots).  Pt with other significant PMH of HTN, gout, anemia, DDD (lumbar).      PT Comments    Pt see in conjunction with Judy Pimple from [place] re: bracing.  LE strength re-assessed.  Pt continues to be weaker in his right leg than his left leg, but he is losing strength in his left, likely due to prolonged bedrest while trying to figure out a plan for his LE DVTs.  He does have active DF in bil ankles and active but weak quad bil.  He is unable to lift either leg against gravity (SLR/hip flexion).   We did get EOB and attempted standing x 3 with elevated bed and steady standing frame with R bledsoe donned and locked in extension.  Pt was able to lift his bottom up, but was unable to extend his buttocks/trunk to get to upright standing.  Despite this, we were determined to get up OOB, so the maxi move total lift was used for OOB mobility  today.  Pt's VSS throughout.   Follow Up Recommendations  CIR     Equipment Recommendations  Wheelchair (measurements PT);Wheelchair cushion (measurements PT);Hospital bed    Recommendations for Other Services   NA     Precautions / Restrictions Precautions Precautions: Fall;Cervical Required Braces or Orthoses: Knee Immobilizer - Right;Cervical Brace Knee Immobilizer - Right: On when out of bed or walking(Bledsoe) Cervical Brace: Hard collar Other Brace/Splint: Pt has two Bledsoe braces, the one we have been using is the long leg brace that is locked in full extension.     Mobility  Bed Mobility Overal bed mobility: Needs Assistance Bed Mobility: Rolling;Sidelying to Sit;Sit to Sidelying Rolling: +2 for physical assistance;Max assist Sidelying to sit: +2 for physical assistance;Max assist     Sit to sidelying: +2 for physical assistance;Max assist General bed mobility comments: Two person max assist to perform bed mobility rolling to get on lift pad after attempting to stand EOB.  Pt assisting with UEs and as much as he can throwing his legs over, but mostly max assist to roll bil.  Assist needed to help progress bil legs over EOB and bring trunk up from   Transfers Overall transfer level: Needs assistance   Transfers: Sit to/from Stand Sit to Stand: +2 physical assistance;Max assist;From elevated surface         General transfer comment: Two person max assist to attempt standing x 3 from continually increased  elelvated bed.  Pt unable to get his trunk upright, but could lift his bottom up off of his sitting surface.  Pt tried three times and was unsuccessful in getting to stand.  Pt positioned back in the bed and then lifted with the maxi move total lift OOB to the chair.   Ambulation/Gait             General Gait Details: Unable at this time.           Balance Overall balance assessment: Needs assistance Sitting-balance support: Feet supported;Bilateral  upper extremity supported Sitting balance-Leahy Scale: Poor Sitting balance - Comments: Needs min to min guard assist in sitting EOB, especially when attempting to do things with his legs.  Postural control: Posterior lean Standing balance support: Bilateral upper extremity supported Standing balance-Leahy Scale: Zero Standing balance comment: unable to get to standing.                             Cognition Arousal/Alertness: Awake/alert Behavior During Therapy: WFL for tasks assessed/performed Overall Cognitive Status: Within Functional Limits for tasks assessed                                 General Comments: PT still on edge and defensive at the beginning of the session, but grateful to be up and for someone to show interest in his welfare by the end of the session.          General Comments General comments (skin integrity, edema, etc.): Pt's LE strength and sensation re-tested.  Per pt report he has full sensation in bil LEs, no numbness and he is able to localize to LT bil.  L leg remains stronger than right leg and he is able to resist DF and PF in his left leg 3+/5 and 2+/5 right, knee extension left 3-/5 and R 2+/5, hip 2/5 abduction left and 2-/5 abduction R, very weak hip flexion against gravity.        Pertinent Vitals/Pain Pain Assessment: Faces Faces Pain Scale: Hurts even more Pain Location: posterior neck Pain Descriptors / Indicators: Grimacing Pain Intervention(s): Limited activity within patient's tolerance;Monitored during session;Repositioned;Premedicated before session           PT Goals (current goals can now be found in the care plan section) Acute Rehab PT Goals Patient Stated Goal: to be able to walk again willing to go slow for therapy  Progress towards PT goals: Progressing toward goals    Frequency    Min 5X/week      PT Plan Current plan remains appropriate       AM-PAC PT "6 Clicks" Daily Activity  Outcome  Measure  Difficulty turning over in bed (including adjusting bedclothes, sheets and blankets)?: Unable Difficulty moving from lying on back to sitting on the side of the bed? : Unable Difficulty sitting down on and standing up from a chair with arms (e.g., wheelchair, bedside commode, etc,.)?: Unable Help needed moving to and from a bed to chair (including a wheelchair)?: Total Help needed walking in hospital room?: Total Help needed climbing 3-5 steps with a railing? : Total 6 Click Score: 6    End of Session Equipment Utilized During Treatment: Gait belt;Right knee immobilizer;Cervical collar Activity Tolerance: Patient limited by pain;Patient limited by fatigue Patient left: in chair;with call bell/phone within reach Nurse Communication: Mobility status;Need for lift equipment PT Visit  Diagnosis: Other abnormalities of gait and mobility (R26.89);Other symptoms and signs involving the nervous system (R29.898);Muscle weakness (generalized) (M62.81)     Time: 6045-4098 PT Time Calculation (min) (ACUTE ONLY): 74 min  Charges:     5 TA                   Jenner Rosier B. Ysabel Cowgill, PT, DPT  Acute Rehabilitation [phone] pager [phone]) [phone] office

## 2018-06-29 NOTE — Progress Notes (Signed)
PROGRESS NOTE    [name] [name]  WUJ:[id number] DOB: 1965/12/19 DOA: 06/06/2018 PCP: Devra Dopp, MD   Brief Narrative:  52 year old man with past medical history relevant for gout, hypertension anterior cervical corpectomy of C4 on 03/24/2018 with comp gated postoperative course including development of multiple PEs and DVTs started on anticoagulation and subsequently developed hematoma requiring reexploration on 03/28/2018 status post placement of IVC filter who was discharged to skilled nursing facility and had a complicated course including progressive lower extremity weakness, intermittent fevers of unclear etiology and hospitalizations for "pneumonia".  Patient was admitted to this hospital is a transfer from [facility] with MRI of C-spine that showed postoperative changes and cord compression with cord edema and concern for ischemia.  Patient is status post posterior cervical decompression laminectomy of C3, C4, C5, removal of spinous processes, foraminotomies on 06/14/2018.  On 06/22/2018 patient began to develop worsening left sided paresthesias and weakness and a stat MRI showed evidence of postoperative hematoma.  Patient was taken to the OR with evacuation of hematoma.  Additionally his postoperative course has been complicated by recurrent fevers, bilateral lower extremity DVTs on ultrasound, migratory arthralgias/arthritis that are thought to be rheumatologic in nature as well as significant orthostasis limiting the amount he can work with physical therapy.  Assessment & Plan   Near syncope due to orthostatic hypotension -Likely combination of hypovolemia as well as autonomic dysfunction -Patient was given IV fluids and placed on TED hose -He eventually was started on midodrine -Echocardiogram July 2019 showed a normal systolic function, grade 2 diastolic dysfunction.  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 -Neurosurgery consulted and appreciated -Patient was noted to have left-sided weakness and numbness -Status post cervical decompressive surgery on 06/14/2018 -Status post evacuation of large hematoma and placement of Hemovac drain on 06/22/2018 -Continue IV dexamethasone per neurosurgery -Pending SNF vs CIR  Thrombocytopenia/coagulopathy -Neurosurgery concerned about coagulopathy due to recurrent hematomas -Hematology oncology consulted and appreciated -Cardiolipin antibody negative -Factor 5, prothrombin gene mutation, von Willebrand pending -Lupus anticoagulant, homocysteine level, factor VIII within normal limits -Protein S activity 60 (low) -Protein C total pending; Activity 199 (high)  -Oncology recommending argatroban  -platelets today 65, stable   Polyarthralgia/persistent fever -Patient had symptoms in the shoulders and ankles, along with a history of gout and pain in his knees.  He underwent arthrocentesis however no evidence of infection was found. -He was noted to have elevated inflammatory markers including CCP, CRP.  However ANCA, aldolase, ANA were negative. -Previous rounding physician discussed with Dr. Deanne Coffer, rheumatologist, he will follow-up with the patient as an outpatient. -Patient was placed on prednisone however currently on dexamethasone (neurosurgery) -No longer having fevers  Cough -Resolved  Mild acute renal failure -resolved  Recent PE in setting of spine surgery -Status post IVC filter placement on March 28, 2018 -Patient was on apixaban however changed over to IV heparin.  Anticoagulation however was discontinued for surgery.  Patient also noted to have thrombocytopenia. -Vascular DVT US 06/26/2018 showed bilateral lower extremity DVTs likely acute -Hematology consulted and appreciated, recommended Argatroban.  However neurosurgery is wanting to hold any type of anticoagulation for an additional 3 days  Essential hypertension -Patient was on  amlodipine however was noted to have lower extremity edema and amlodipine was discontinued.  He was also noted to have orthostatic hypotension as noted above.  Gout -Does not seem to be active at this time, continue Uloric  Normocytic anemia -Currently 9.3 appears  to be stable, continue to monitor CBC  DVT Prophylaxis  SCDs  Code Status: Full  Family Communication: None at bedside.  Disposition Plan: Admitted. Dispo pending (either CIR vs SNF).   Consultants Neurosurgery Orthopedic surgery Infectious disease  Inpatient rehab  Procedures  On 10/2 1. posterior cervical decompressive laminectomy at C3, C4, C5 with removal of the spinous process and complete laminectomy and foraminotomies of the C3, C4, C5 nerve roots. 2. Posterior cervical fusion with lateral mass screws at C3-C4-C5 and pars screws at C2 lies in the globus ellipseLateral mass screw system 3. Posterior lateral arthrodesis C2-C5 utilizing locally harvested autograft mixed with vivigen  06/22/2018: Procedure reexploration posterior cervical wound for evacuation of epidural hematoma  06/26/2018 DVT ultrasound:Right: Findings consistent with acute deep vein thrombosis involving the right common femoral vein, right femoral vein, right popliteal vein, right posterior tibial vein, and right peroneal vein. Thrombosis extends proximally into external iliac vein.  Proximal iliac vein not visualized. Left: Findings consistent with acute deep vein thrombosis involving the left common femoral vein, left femoral vein, left popliteal vein, left posterior tibial vein, and left peroneal vein. Findings consistent with acute superficial vein thrombosis  involving the left great saphenous vein. Thrombosis extends proximally into external iliac vein. Proximal iliac vein not visualized.  Antibiotics   Anti-infectives (From admission, onward)   Start     Dose/Rate Route Frequency Ordered Stop   06/27/18 1000  fluconazole (DIFLUCAN)  tablet 200 mg     200 mg Oral Daily 06/27/18 0725     06/22/18 2315  ceFAZolin (ANCEF) IVPB 2g/100 mL premix     2 g 200 mL/hr over 30 Minutes Intravenous Every 8 hours 06/22/18 2314 06/27/18 1519   06/22/18 1828  bacitracin 50,000 Units in sodium chloride 0.9 % 500 mL irrigation  Status:  Discontinued       As needed 06/22/18 1828 06/22/18 1915   06/22/18 1700  ceFAZolin (ANCEF) 3 g in dextrose 5 % 50 mL IVPB  Status:  Discontinued     3 g 100 mL/hr over 30 Minutes Intravenous  Once 06/22/18 1647 06/22/18 2101   06/14/18 2100  ceFAZolin (ANCEF) IVPB 2g/100 mL premix     2 g 200 mL/hr over 30 Minutes Intravenous Every 8 hours 06/14/18 1712 06/17/18 0800   06/14/18 1414  bacitracin 50,000 Units in sodium chloride 0.9 % 500 mL irrigation  Status:  Discontinued       As needed 06/14/18 1414 06/14/18 1647   06/14/18 1215  ceFAZolin (ANCEF) 3 g in dextrose 5 % 50 mL IVPB     3 g 160 mL/hr over 30 Minutes Intravenous To [facility] 06/14/18 1202 06/14/18 2225   06/14/18 1200  ceFAZolin (ANCEF) IVPB 2g/100 mL premix  Status:  Discontinued     2 g 200 mL/hr over 30 Minutes Intravenous On call to O.R. 06/14/18 1157 06/14/18 2140      Subjective:   Shaman Fabrizio seen and examined today.  Has no complaints today. Father via phone has lots of questions, all answered. Denies current chest pain, shortness of breath, abdominal pain, nausea, vomiting, diarrhea, dizziness, headache. Complains of poor appetite, but is eating caramel popcorn.   Objective:   Vitals:   06/28/18 1946 06/28/18 2325 06/29/18 0339 06/29/18 0731  BP: (!) 148/90 (!) 145/97 (!) 149/93 (!) 145/93  Pulse: 73 96 90 77  Resp: 20 (!) 24 15 18   Temp: 98.4 F (36.9 C) 98.2 F (36.8 C) 98.3 F (36.8 C) 98.4  F (36.9 C)  TempSrc: Oral Oral Oral Oral  SpO2: 98% 100% 100% 98%  Weight:      Height:        Intake/Output Summary (Last 24 hours) at 06/29/2018 1032 Last data filed at 06/28/2018 1753 Gross per 24 hour    Intake 180 ml  Output 1400 ml  Net -1220 ml   Filed Weights   06/06/18 2246 06/14/18 1151 06/22/18 1641  Weight: 123.8 kg 123.8 kg 123.8 kg   Exam  General: Well developed, well nourished, obese, NAD  HEENT: NCAT, mucous membranes moist.   Neck: Collar in place  Cardiovascular: S1 S2 auscultated, RRR, no murmurs   Respiratory: Clear to auscultation bilaterally with equal chest rise  Abdomen: Soft, obese, nontender, nondistended, + bowel sounds  Extremities: warm dry without cyanosis clubbing or edema  Neuro: AAOx3, nonfocal  Psych: Normal affect and demeanor with intact judgement and insight, pleasant   Data Reviewed: I have personally reviewed following labs and imaging studies  CBC: Recent Labs  Lab 06/25/18 0555 06/26/18 0500 06/27/18 0630 06/28/18 0500 06/29/18 0646  WBC 11.5* 12.6* 11.9* 13.5* 12.8*  NEUTROABS  --   --  10.1* 11.6* 10.8*  HGB 9.4* 9.6* 9.6* 9.1* 9.3*  HCT 29.6* 31.2* 31.0* 30.9* 31.3*  MCV 92.8 93.4 95.1 96.3 96.3  PLT 104* 86* 74* 65* 65*   Basic Metabolic Panel: Recent Labs  Lab 06/23/18 0400 06/24/18 1245 06/25/18 0555  NA 135 133* 134*  K 4.8 4.6 4.7  CL 99 101 100  CO2 23 27 27   GLUCOSE 162* 131* 194*  BUN 27* 29* 30*  CREATININE 0.94 0.84 0.92  CALCIUM 9.3 9.0 8.9   GFR: Estimated Creatinine Clearance: 122.1 mL/min (by C-G formula based on SCr of 0.92 mg/dL). Liver Function Tests: No results for input(s): AST, ALT, ALKPHOS, BILITOT, PROT, ALBUMIN in the last 168 hours. No results for input(s): LIPASE, AMYLASE in the last 168 hours. No results for input(s): AMMONIA in the last 168 hours. Coagulation Profile: Recent Labs  Lab 06/22/18 1950 06/27/18 0630  INR 1.29 1.23   Cardiac Enzymes: No results for input(s): CKTOTAL, CKMB, CKMBINDEX, TROPONINI in the last 168 hours. BNP (last 3 results) No results for input(s): PROBNP in the last 8760 hours. HbA1C: No results for input(s): HGBA1C in the last 72  hours. CBG: Recent Labs  Lab 06/28/18 0734 06/28/18 1227 06/28/18 1703 06/29/18 0113 06/29/18 0736  GLUCAP 184* 165* 165* 223* 197*   Lipid Profile: No results for input(s): CHOL, HDL, LDLCALC, TRIG, CHOLHDL, LDLDIRECT in the last 72 hours. Thyroid Function Tests: No results for input(s): TSH, T4TOTAL, FREET4, T3FREE, THYROIDAB in the last 72 hours. Anemia Panel: No results for input(s): VITAMINB12, FOLATE, FERRITIN, TIBC, IRON, RETICCTPCT in the last 72 hours. Urine analysis:    Component Value Date/Time   COLORURINE AMBER (A) 06/07/2018 0900   APPEARANCEUR CLOUDY (A) 06/07/2018 0900   LABSPEC 1.028 06/07/2018 0900   PHURINE 5.0 06/07/2018 0900   GLUCOSEU NEGATIVE 06/07/2018 0900   HGBUR SMALL (A) 06/07/2018 0900   BILIRUBINUR NEGATIVE 06/07/2018 0900   KETONESUR 5 (A) 06/07/2018 0900   PROTEINUR 30 (A) 06/07/2018 0900   NITRITE NEGATIVE 06/07/2018 0900   LEUKOCYTESUR NEGATIVE 06/07/2018 0900   Sepsis Labs: @LABRCNTIP (procalcitonin:4,lacticidven:4)  )No results found for this or any previous visit (from the past 240 hour(s)).    Radiology Studies: No results found.   Scheduled Meds: . sodium chloride   Intravenous Once  . dexamethasone  2 mg  Intravenous Q6H  . docusate sodium  100 mg Oral Daily  . febuxostat  40 mg Oral Daily  . fluconazole  200 mg Oral Daily  . furosemide  20 mg Intravenous Once  . furosemide  20 mg Intravenous Once  . gabapentin  300 mg Oral TID  . Influenza vac split quadrivalent PF  0.5 mL Intramuscular Tomorrow-1000  . insulin aspart  0-5 Units Subcutaneous QHS  . insulin aspart  0-9 Units Subcutaneous TID WC  . methocarbamol  750 mg Oral QID  . midodrine  5 mg Oral TID WC  . nystatin  5 mL Oral TID AC & HS  . pantoprazole  40 mg Oral Daily  . polyethylene glycol  17 g Oral BID  . senna-docusate  1 tablet Oral QHS  . sodium chloride flush  10-40 mL Intracatheter Q12H  . sodium chloride flush  3 mL Intravenous Q12H   Continuous  Infusions: . sodium chloride 250 mL (06/23/18 1616)  . lactated ringers 10 mL/hr at 06/24/18 1114     LOS: 22 days   Time Spent in minutes   30 minutes  Massai Hankerson D.O. on 06/29/2018 at 10:32 AM  Between 7am to 7pm - Please see pager noted on [email or web address]  After 7pm go to [email or web address]  And look for the night coverage person covering for me after hours  [facility] Office  [phone]

## 2018-06-29 NOTE — Progress Notes (Addendum)
Subjective: Patient reports + BM and doing fairly well without compliants this am  Objective: Vital signs in last 24 hours: Temp:  [98.2 F (36.8 C)-98.8 F (37.1 C)] 98.4 F (36.9 C) (10/17 0731) Pulse Rate:  [57-96] 77 (10/17 0731) Resp:  [15-24] 18 (10/17 0731) BP: (131-149)/(83-99) 145/93 (10/17 0731) SpO2:  [98 %-100 %] 98 % (10/17 0731)  Intake/Output from previous day: 10/16 0701 - 10/17 0700 In: 300 [P.O.:300] Out: 1400 [Urine:1250; Drains:150] Intake/Output this shift: No intake/output data recorded.  neuro unchanged 4+/5, wound dry  Lab Results: Recent Labs    06/27/18 0630 06/28/18 0500  WBC 11.9* 13.5*  HGB 9.6* 9.1*  HCT 31.0* 30.9*  PLT 74* 65*   BMET No results for input(s): NA, K, CL, CO2, GLUCOSE, BUN, CREATININE, CALCIUM in the last 72 hours.  Studies/Results: No results found.  Assessment/Plan: POD 7 DCL for EDH.  Start anti-coagulaion tomorrow, continue PT/OT.  PLT continue to drop.. consider Transfuse?  LOS: 22 days     Cheney Gosch P 06/29/2018, 7:47 AM

## 2018-06-29 NOTE — Progress Notes (Signed)
New orders entered by Verlin Dike, NP for NR to pull patient's hemovac. Discussion had with Assist. Director regarding this order. RN asked to contact NP and request she pull patient's hemovac given patient's complex history. RN notified NP via phone [phone], detailed voicemail left requesting order is carried out by provider.

## 2018-06-30 LAB — CBC WITH DIFFERENTIAL/PLATELET
Abs Immature Granulocytes: 0.17 10*3/uL — ABNORMAL HIGH (ref 0.00–0.07)
BASOS ABS: 0 10*3/uL (ref 0.0–0.1)
Basophils Relative: 0 %
EOS PCT: 0 %
Eosinophils Absolute: 0 10*3/uL (ref 0.0–0.5)
HEMATOCRIT: 28.7 % — AB (ref 39.0–52.0)
HEMOGLOBIN: 8.7 g/dL — AB (ref 13.0–17.0)
IMMATURE GRANULOCYTES: 2 %
LYMPHS ABS: 0.8 10*3/uL (ref 0.7–4.0)
LYMPHS PCT: 7 %
MCH: 29.3 pg (ref 26.0–34.0)
MCHC: 30.3 g/dL (ref 30.0–36.0)
MCV: 96.6 fL (ref 80.0–100.0)
MONOS PCT: 5 %
Monocytes Absolute: 0.6 10*3/uL (ref 0.1–1.0)
NRBC: 0 % (ref 0.0–0.2)
Neutro Abs: 10 10*3/uL — ABNORMAL HIGH (ref 1.7–7.7)
Neutrophils Relative %: 86 %
Platelets: 56 10*3/uL — ABNORMAL LOW (ref 150–400)
RBC: 2.97 MIL/uL — ABNORMAL LOW (ref 4.22–5.81)
RDW: 17 % — ABNORMAL HIGH (ref 11.5–15.5)
WBC: 11.6 10*3/uL — ABNORMAL HIGH (ref 4.0–10.5)

## 2018-06-30 LAB — IMMATURE PLATELET FRACTION: IMMATURE PLATELET FRACTION: 7.1 % (ref 1.2–8.6)

## 2018-06-30 LAB — GLUCOSE, CAPILLARY
GLUCOSE-CAPILLARY: 159 mg/dL — AB (ref 70–99)
Glucose-Capillary: 171 mg/dL — ABNORMAL HIGH (ref 70–99)
Glucose-Capillary: 163 mg/dL — ABNORMAL HIGH (ref 70–99)
Glucose-Capillary: 183 mg/dL — ABNORMAL HIGH (ref 70–99)

## 2018-06-30 LAB — APTT
aPTT: 25 seconds (ref 24–36)
aPTT: 26 seconds (ref 24–36)

## 2018-06-30 LAB — PROTIME-INR
INR: 1.33
PROTHROMBIN TIME: 16.4 s — AB (ref 11.4–15.2)

## 2018-06-30 MED ORDER — SODIUM CHLORIDE 0.9% IV SOLUTION: Freq: Once | INTRAVENOUS | Status: DC

## 2018-06-30 MED ORDER — HYDROCOD POLST-CPM POLST ER 10-8 MG/5ML PO SUER: 5.0000 mL | Freq: Every evening | ORAL | Status: DC | PRN

## 2018-06-30 MED ORDER — DOXYCYCLINE HYCLATE 100 MG PO TABS
100.0000 mg | ORAL_TABLET | Freq: Two times a day (BID) | ORAL | Status: DC
  Administered 2018-06-30 – 2018-07-12 (×21): 100 mg via ORAL
  Filled 2018-06-30 (×21): qty 1

## 2018-06-30 MED ORDER — FUROSEMIDE 10 MG/ML IJ SOLN
20.0000 mg | Freq: Once | INTRAMUSCULAR | Status: AC
  Administered 2018-06-30: 20 mg via INTRAVENOUS
  Filled 2018-06-30: qty 2

## 2018-06-30 MED ORDER — SODIUM CHLORIDE 0.9% IV SOLUTION
Freq: Once | INTRAVENOUS | Status: AC
  Administered 2018-06-30: 13:00:00 via INTRAVENOUS

## 2018-06-30 NOTE — Progress Notes (Addendum)
Blood bank called noting platelets ready; at this time discussed that only 1 unit is needed, other order has been cancelled.

## 2018-06-30 NOTE — Progress Notes (Signed)
Physical Therapy Treatment Patient Details Name: [name] [name] MRN: [id number] DOB: [birth year] Today's Date: 06/30/2018    History of Present Illness 52 y.o. male admitted on 06/06/18 for bil LE wekness and muscle spasm after multiple falls (with resultant R knee meniscus tear placed in bledsoe brace) following a recent ACDF at C3 in July 2019 (complicated by PE (s/p IVC filter), CIR, and then SNF placement before finally returning home with his parents).  In the ED MRI of c-spine which showed post operative changes and cord edema and possibility of ischemia.  Pt also running low grade fevers without any clear source. His R knee was aspirated on 06/09/18.  Pt underwent posterior cervical decompression and fusion on 06/14/18.  Pt had to have re-exploration of posterior cervical wound for evacuation of epidural hematoma and placement of hemovac on 06/22/18.  Entire acute course complicated by hypotension in standing (thought to be autonomic) and by finding of multiple LE DVTs (already has an IVC filter in place, but per MDs notes at risk for blocking the filter with these clots).  Pt with other significant PMH of HTN, gout, anemia, DDD (lumbar).      PT Comments    Pt was able to attempt standing multiple times from elevated bed and recliner chair.  All attempts were unsuccessful in getting to fully upright standing.   Follow Up Recommendations  CIR     Equipment Recommendations  Wheelchair (measurements PT);Wheelchair cushion (measurements PT);Hospital bed    Recommendations for Other Services   NA     Precautions / Restrictions Precautions Precautions: Fall;Cervical Required Braces or Orthoses: Knee Immobilizer - Right;Cervical Brace Knee Immobilizer - Right: On when out of bed or walking(Bledsoe) Cervical Brace: Hard collar Other Brace/Splint: Pt has two Bledsoe braces, the one we have been using is the long leg brace that is locked in full extension.     Mobility  Bed  Mobility Overal bed mobility: Needs Assistance Bed Mobility: Rolling;Sidelying to Sit;Sit to Sidelying Rolling: +2 for physical assistance;Max assist Sidelying to sit: +2 for physical assistance;Max assist     Sit to sidelying: +2 for physical assistance;Max assist General bed mobility comments: Two person max assist to support his turnk, progress his legs to EOB after rolling to his right side.  Assist needed to help support and control his trunk and lift both legs when returning to bed.   Transfers Overall transfer level: Needs assistance   Transfers: Sit to/from Stand Sit to Stand: +2 physical assistance;Max assist;From elevated surface         General transfer comment: Two person max assist with R bledsoe blocked in extension attempting from elevated bed with steady standing frame, Also attempted from recliner chair with bil blocked knees without success.  Pt continues to be willing to try and encouraged him stating that each try is building his strength.   Ambulation/Gait             General Gait Details: unable          Balance Overall balance assessment: Needs assistance Sitting-balance support: Feet supported;Single extremity supported;Bilateral upper extremity supported Sitting balance-Leahy Scale: Poor Sitting balance - Comments: min assist EOB, pt can sustain sitting with close supervision and bil UE support, but he does have increased sway.  Postural control: Posterior lean Standing balance support: Bilateral upper extremity supported Standing balance-Leahy Scale: Zero Standing balance comment: unable to get to full upright standing.  Cognition Arousal/Alertness: Awake/alert Behavior During Therapy: WFL for tasks assessed/performed Overall Cognitive Status: Within Functional Limits for tasks assessed                                        Exercises General Exercises - Lower Extremity Ankle  Circles/Pumps: AROM;Both;10 reps Long Arc Quad: AROM;Left;10 reps        Pertinent Vitals/Pain Pain Assessment: Faces Faces Pain Scale: Hurts even more Pain Location: posterior neck Pain Descriptors / Indicators: Grimacing Pain Intervention(s): Limited activity within patient's tolerance;Monitored during session;Repositioned           PT Goals (current goals can now be found in the care plan section) Acute Rehab PT Goals Patient Stated Goal: to be able to walk again willing to go slow for therapy  Progress towards PT goals: Progressing toward goals    Frequency    Min 5X/week      PT Plan Current plan remains appropriate       AM-PAC PT "6 Clicks" Daily Activity  Outcome Measure  Difficulty turning over in bed (including adjusting bedclothes, sheets and blankets)?: Unable Difficulty moving from lying on back to sitting on the side of the bed? : Unable Difficulty sitting down on and standing up from a chair with arms (e.g., wheelchair, bedside commode, etc,.)?: Unable Help needed moving to and from a bed to chair (including a wheelchair)?: Total Help needed walking in hospital room?: Total Help needed climbing 3-5 steps with a railing? : Total 6 Click Score: 6    End of Session Equipment Utilized During Treatment: Gait belt;Right knee immobilizer;Cervical collar Activity Tolerance: Patient limited by pain;Patient limited by fatigue Patient left: in chair;with call bell/phone within reach;with family/visitor present Nurse Communication: Mobility status;Need for lift equipment PT Visit Diagnosis: Other abnormalities of gait and mobility (R26.89);Other symptoms and signs involving the nervous system (R29.898);Muscle weakness (generalized) (M62.81)     Time: 1610-9604 PT Time Calculation (min) (ACUTE ONLY): 60 min  Charges:  $Therapeutic Activity: 53-67 mins                    Sana Tessmer B. Sarahi Borland, PT, DPT  Acute Rehabilitation [phone] pager #[phone]  [phone] office   06/30/2018, 4:48 PM

## 2018-06-30 NOTE — Progress Notes (Signed)
Met with pt and pt's mother to discuss possible LTAC transfer, as requested by attending MD.  Discussed LTAC hospital, and LTAC hospitals available in this area.  Answered all questions to the best of my ability.  After much discussion, pt and mother do not wish to consider LTAC at this time.  They feel that pt would not be benefit from LTAC, as he would essentially be "starting over", and know one would be familiar with his unique medical issues.  Will notify attending.    Reinaldo Raddle, RN, BSN  Trauma/Neuro ICU Case Manager [phone]

## 2018-06-30 NOTE — Progress Notes (Signed)
PT Cancellation Note  Patient Details Name: [name] [name] MRN: [id number] DOB: 1966/06/13   Cancelled Treatment:    Reason Eval/Treat Not Completed: Medical issues which prohibited therapy.  Per RN, pt is bleeding profusely from his surgical site.  PT will hold for now, may check back later today as time allows.  Thanks,  Rollene Rotunda. Rikita Grabert, PT, DPT  Acute Rehabilitation [phone] pager [phone]) [phone] office     Lurena Joiner B Chase Arnall 06/30/2018, 10:04 AM

## 2018-06-30 NOTE — Progress Notes (Addendum)
PROGRESS NOTE    [name] [name]  ZOX:[id number] DOB: [birth year] DOA: 06/06/2018 PCP: Devra Dopp, MD   Brief Narrative:  52 year old man with past medical history relevant for gout, hypertension anterior cervical corpectomy of C4 on 03/24/2018 with comp gated postoperative course including development of multiple PEs and DVTs started on anticoagulation and subsequently developed hematoma requiring reexploration on 03/28/2018 status post placement of IVC filter who was discharged to skilled nursing facility and had a complicated course including progressive lower extremity weakness, intermittent fevers of unclear etiology and hospitalizations for "pneumonia".  Patient was admitted to this hospital is a transfer from [facility] with MRI of C-spine that showed postoperative changes and cord compression with cord edema and concern for ischemia.  Patient is status post posterior cervical decompression laminectomy of C3, C4, C5, removal of spinous processes, foraminotomies on 06/14/2018.  On 06/22/2018 patient began to develop worsening left sided paresthesias and weakness and a stat MRI showed evidence of postoperative hematoma.  Patient was taken to the OR with evacuation of hematoma.  Additionally his postoperative course has been complicated by recurrent fevers, bilateral lower extremity DVTs on ultrasound, migratory arthralgias/arthritis that are thought to be rheumatologic in nature as well as significant orthostasis limiting the amount he can work with physical therapy.  Today, patient with active bleeding from drain site. Platelets to be transfused.   Assessment & Plan   Near syncope due to orthostatic hypotension -Likely combination of hypovolemia as well as autonomic dysfunction -Patient was given IV fluids and placed on TED hose -He eventually was started on midodrine -Echocardiogram July 2019 showed a normal systolic function, grade 2 diastolic dysfunction.  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 -Neurosurgery consulted and appreciated -Patient was noted to have left-sided weakness and numbness -Status post cervical decompressive surgery on 06/14/2018 -Status post evacuation of large hematoma and placement of Hemovac drain on 06/22/2018 -Continue IV dexamethasone per neurosurgery -Pending SNF vs CIR (not sure if patient could withstand CIR, however, feel he needs more monitoring than can be provided at SNF; will speak with case management regarding LTAC?)  Thrombocytopenia/coagulopathy -Neurosurgery concerned about coagulopathy due to recurrent hematomas -Hematology oncology consulted and appreciated, recommended argatroban (currently holding off on starting) -Cardiolipin antibody negative -Lupus anticoagulant, homocysteine level, factor VIII within normal limits -Protein S activity 60 (low) -Protein C total >200; Activity 199 (high)  -platelets today 56 -unfortunately, drain was pulled on 10/17, patient with bleeding today -1 unit platelets ordered for transfusion -holding off on starting argatroban or any other anticoagulant at this time -HIT panel and immature platelet fraction pending  Polyarthralgia/persistent fever -Patient had symptoms in the shoulders and ankles, along with a history of gout and pain in his knees.  He underwent arthrocentesis however no evidence of infection was found. -He was noted to have elevated inflammatory markers including CCP, CRP.  However ANCA, aldolase, ANA were negative. -Previous rounding physician discussed with Dr. Deanne Coffer, rheumatologist, he will follow-up with the patient as an outpatient. -Patient was placed on prednisone however currently on dexamethasone (neurosurgery) -No longer having fevers  Cough -Resolved  Mild acute renal failure -resolved  Recent PE /DVT in setting of spine surgery -Status post IVC filter placement on March 28, 2018 -Patient  was on apixaban however changed over to IV heparin.  Anticoagulation however was discontinued for surgery.  Patient also noted to have thrombocytopenia. -Vascular DVT US 06/26/2018 showed bilateral lower extremity DVTs likely acute -Hematology consulted and  appreciated, recommended Argatroban.  However neurosurgery is wanting to hold any type of anticoagulation for an additional 3 days -Discussed with vascular surgery, Dr. Myra Gianotti, patient is out of the 3 week window for treatment, he would have to get TPA, which is contraindicated in a bleeding patient.  -Feels LE are swollen, will give one dose of IV lasix and continue to monitor closely  Essential hypertension -Patient was on amlodipine however was noted to have lower extremity edema and amlodipine was discontinued.  He was also noted to have orthostatic hypotension as noted above.  Gout -Does not seem to be active at this time, continue Uloric  Normocytic anemia -Currently hemoglobin 8.7, continue to monitor CBC  DVT Prophylaxis  SCDs  Code Status: Full  Family Communication: None at bedside.  Disposition Plan: Admitted. Dispo pending (either CIR vs SNF). Patient currently not medically stable for discharge.   Consultants Neurosurgery Orthopedic surgery Infectious disease  Inpatient rehab Vascular surgery , Dr. Myra Gianotti, via phone  Procedures  On 10/2 1. posterior cervical decompressive laminectomy at C3, C4, C5 with removal of the spinous process and complete laminectomy and foraminotomies of the C3, C4, C5 nerve roots. 2. Posterior cervical fusion with lateral mass screws at C3-C4-C5 and pars screws at C2 lies in the globus ellipseLateral mass screw system 3. Posterior lateral arthrodesis C2-C5 utilizing locally harvested autograft mixed with vivigen  06/22/2018: Procedure reexploration posterior cervical wound for evacuation of epidural hematoma  06/26/2018 DVT ultrasound:Right: Findings consistent with acute deep  vein thrombosis involving the right common femoral vein, right femoral vein, right popliteal vein, right posterior tibial vein, and right peroneal vein. Thrombosis extends proximally into external iliac vein.  Proximal iliac vein not visualized. Left: Findings consistent with acute deep vein thrombosis involving the left common femoral vein, left femoral vein, left popliteal vein, left posterior tibial vein, and left peroneal vein. Findings consistent with acute superficial vein thrombosis  involving the left great saphenous vein. Thrombosis extends proximally into external iliac vein. Proximal iliac vein not visualized.  Antibiotics   Anti-infectives (From admission, onward)   Start     Dose/Rate Route Frequency Ordered Stop   06/27/18 1000  fluconazole (DIFLUCAN) tablet 200 mg     200 mg Oral Daily 06/27/18 0725     06/22/18 2315  ceFAZolin (ANCEF) IVPB 2g/100 mL premix     2 g 200 mL/hr over 30 Minutes Intravenous Every 8 hours 06/22/18 2314 06/27/18 1519   06/22/18 1828  bacitracin 50,000 Units in sodium chloride 0.9 % 500 mL irrigation  Status:  Discontinued       As needed 06/22/18 1828 06/22/18 1915   06/22/18 1700  ceFAZolin (ANCEF) 3 g in dextrose 5 % 50 mL IVPB  Status:  Discontinued     3 g 100 mL/hr over 30 Minutes Intravenous  Once 06/22/18 1647 06/22/18 2101   06/14/18 2100  ceFAZolin (ANCEF) IVPB 2g/100 mL premix     2 g 200 mL/hr over 30 Minutes Intravenous Every 8 hours 06/14/18 1712 06/17/18 0800   06/14/18 1414  bacitracin 50,000 Units in sodium chloride 0.9 % 500 mL irrigation  Status:  Discontinued       As needed 06/14/18 1414 06/14/18 1647   06/14/18 1215  ceFAZolin (ANCEF) 3 g in dextrose 5 % 50 mL IVPB     3 g 160 mL/hr over 30 Minutes Intravenous To [facility] 06/14/18 1202 06/14/18 2225   06/14/18 1200  ceFAZolin (ANCEF) IVPB 2g/100 mL premix  Status:  Discontinued     2 g 200 mL/hr over 30 Minutes Intravenous On call to O.R. 06/14/18 1157 06/14/18 2140        Subjective:   Ryane Leaming seen and examined today.  Was unable to sleep well overnight. Denies current pain, chest pain, shortness of breath, abdominal pain, N/V/D/C. Feels his right leg is "puffy" compared to the left that he has gained weight.   Objective:   Vitals:   06/29/18 1900 06/29/18 2328 06/30/18 0627 06/30/18 0730  BP: (!) 136/93 135/87 129/82 135/88  Pulse: 80 72 67 74  Resp:   18 16  Temp: 98.8 F (37.1 C) 98.9 F (37.2 C) 98.2 F (36.8 C) 98.1 F (36.7 C)  TempSrc: Oral Oral Oral Oral  SpO2: 98% 100%  98%  Weight:      Height:        Intake/Output Summary (Last 24 hours) at 06/30/2018 1106 Last data filed at 06/30/2018 0800 Gross per 24 hour  Intake 480 ml  Output 1990 ml  Net -1510 ml   Filed Weights   06/06/18 2246 06/14/18 1151 06/22/18 1641  Weight: 123.8 kg 123.8 kg 123.8 kg   Exam  General: Well developed, obese, NAD  HEENT: NCAT, mucous membranes moist.   Neck: Collar in place   Cardiovascular: S1 S2 auscultated, RRR, no murmurs  Respiratory: Clear to auscultation bilaterally with equal chest rise  Abdomen: Soft, nontender, nondistended, + bowel sounds  Extremities: warm dry without cyanosis clubbing. LE edema R>L  Neuro: AAOx3, nonfocal  Psych: Depressed, however appropriate  Data Reviewed: I have personally reviewed following labs and imaging studies  CBC: Recent Labs  Lab 06/26/18 0500 06/27/18 0630 06/28/18 0500 06/29/18 0646 06/30/18 0500  WBC 12.6* 11.9* 13.5* 12.8* 11.6*  NEUTROABS  --  10.1* 11.6* 10.8* 10.0*  HGB 9.6* 9.6* 9.1* 9.3* 8.7*  HCT 31.2* 31.0* 30.9* 31.3* 28.7*  MCV 93.4 95.1 96.3 96.3 96.6  PLT 86* 74* 65* 65* 56*   Basic Metabolic Panel: Recent Labs  Lab 06/24/18 1245 06/25/18 0555  NA 133* 134*  K 4.6 4.7  CL 101 100  CO2 27 27  GLUCOSE 131* 194*  BUN 29* 30*  CREATININE 0.84 0.92  CALCIUM 9.0 8.9   GFR: Estimated Creatinine Clearance: 122.1 mL/min (by C-G formula based on SCr  of 0.92 mg/dL). Liver Function Tests: No results for input(s): AST, ALT, ALKPHOS, BILITOT, PROT, ALBUMIN in the last 168 hours. No results for input(s): LIPASE, AMYLASE in the last 168 hours. No results for input(s): AMMONIA in the last 168 hours. Coagulation Profile: Recent Labs  Lab 06/27/18 0630  INR 1.23   Cardiac Enzymes: No results for input(s): CKTOTAL, CKMB, CKMBINDEX, TROPONINI in the last 168 hours. BNP (last 3 results) No results for input(s): PROBNP in the last 8760 hours. HbA1C: No results for input(s): HGBA1C in the last 72 hours. CBG: Recent Labs  Lab 06/29/18 0736 06/29/18 1245 06/29/18 1718 06/29/18 2151 06/30/18 0735  GLUCAP 197* 165* 163* 191* 159*   Lipid Profile: No results for input(s): CHOL, HDL, LDLCALC, TRIG, CHOLHDL, LDLDIRECT in the last 72 hours. Thyroid Function Tests: No results for input(s): TSH, T4TOTAL, FREET4, T3FREE, THYROIDAB in the last 72 hours. Anemia Panel: No results for input(s): VITAMINB12, FOLATE, FERRITIN, TIBC, IRON, RETICCTPCT in the last 72 hours. Urine analysis:    Component Value Date/Time   COLORURINE AMBER (A) 06/07/2018 0900   APPEARANCEUR CLOUDY (A) 06/07/2018 0900   LABSPEC 1.028 06/07/2018 0900   PHURINE  5.0 06/07/2018 0900   GLUCOSEU NEGATIVE 06/07/2018 0900   HGBUR SMALL (A) 06/07/2018 0900   BILIRUBINUR NEGATIVE 06/07/2018 0900   KETONESUR 5 (A) 06/07/2018 0900   PROTEINUR 30 (A) 06/07/2018 0900   NITRITE NEGATIVE 06/07/2018 0900   LEUKOCYTESUR NEGATIVE 06/07/2018 0900   Sepsis Labs: @LABRCNTIP (procalcitonin:4,lacticidven:4)  )No results found for this or any previous visit (from the past 240 hour(s)).    Radiology Studies: No results found.   Scheduled Meds: . sodium chloride   Intravenous Once  . sodium chloride   Intravenous Once  . sodium chloride   Intravenous Once  . dexamethasone  2 mg Intravenous Q6H  . docusate sodium  100 mg Oral Daily  . febuxostat  40 mg Oral Daily  . fluconazole   200 mg Oral Daily  . furosemide  20 mg Intravenous Once  . furosemide  20 mg Intravenous Once  . gabapentin  300 mg Oral TID  . Influenza vac split quadrivalent PF  0.5 mL Intramuscular Tomorrow-1000  . insulin aspart  0-5 Units Subcutaneous QHS  . insulin aspart  0-9 Units Subcutaneous TID WC  . methocarbamol  750 mg Oral QID  . midodrine  5 mg Oral TID WC  . nystatin  5 mL Oral TID AC & HS  . pantoprazole  40 mg Oral Daily  . polyethylene glycol  17 g Oral BID  . senna-docusate  1 tablet Oral QHS  . sodium chloride flush  10-40 mL Intracatheter Q12H   Continuous Infusions: . lactated ringers 10 mL/hr at 06/24/18 1114     LOS: 23 days   Time Spent in minutes   45 minutes (greater than 50% of time spent with patient face to face, as well as reviewing  records, calling consults, and formulating a plan)  Edsel Petrin D.O. on 06/30/2018 at 11:06 AM  Between 7am to 7pm - Please see pager noted on [email or web address]  After 7pm go to [email or web address]  And look for the night coverage person covering for me after hours  [facility] Office  [phone]

## 2018-06-30 NOTE — Progress Notes (Addendum)
RN entered pt room 0920 to find blood saturating bed from neck to mid-low back. Blood found to be draining from area that drain was removed yesterday.  Patient without complaints at this time.  NP paged [phone]. Office paged [phone]. MD paged [phone]. Awaiting return call.   1005 Paged [facility] MD to make aware of situation.   1015 MD returned call, orders not to start anticoagulation, STAT PT/INR, 1 unit platelets, and place compression dressing to site.  NP to bedside and placed 2 staples to site.

## 2018-07-01 ENCOUNTER — Inpatient Hospital Stay (HOSPITAL_COMMUNITY): Admitting: Anesthesiology

## 2018-07-01 ENCOUNTER — Inpatient Hospital Stay (HOSPITAL_COMMUNITY)
Admission: AD | Disposition: A | Discharge status: Rehabilitation Facility | Payer: Self-pay | Source: Other Acute Inpatient Hospital | Attending: Internal Medicine

## 2018-07-01 ENCOUNTER — Inpatient Hospital Stay: Payer: Self-pay

## 2018-07-01 ENCOUNTER — Inpatient Hospital Stay (HOSPITAL_COMMUNITY)

## 2018-07-01 ENCOUNTER — Encounter (HOSPITAL_COMMUNITY): Payer: Self-pay | Admitting: Certified Registered"

## 2018-07-01 HISTORY — PX: POSTERIOR CERVICAL LAMINECTOMY FOR EPIDURAL ABSCESS: SHX6034

## 2018-07-01 LAB — PROTIME-INR
INR: 1.2
Prothrombin Time: 15 seconds (ref 11.4–15.2)

## 2018-07-01 LAB — CBC WITH DIFFERENTIAL/PLATELET
Abs Immature Granulocytes: 0.13 10*3/uL — ABNORMAL HIGH (ref 0.00–0.07)
Basophils Absolute: 0 10*3/uL (ref 0.0–0.1)
Basophils Relative: 0 %
EOS ABS: 0 10*3/uL (ref 0.0–0.5)
EOS PCT: 0 %
HEMATOCRIT: 28.9 % — AB (ref 39.0–52.0)
HEMOGLOBIN: 8.7 g/dL — AB (ref 13.0–17.0)
Immature Granulocytes: 1 %
LYMPHS PCT: 9 %
Lymphs Abs: 0.9 10*3/uL (ref 0.7–4.0)
MCH: 28.7 pg (ref 26.0–34.0)
MCHC: 30.1 g/dL (ref 30.0–36.0)
MCV: 95.4 fL (ref 80.0–100.0)
MONO ABS: 0.6 10*3/uL (ref 0.1–1.0)
MONOS PCT: 6 %
Neutro Abs: 8.9 10*3/uL — ABNORMAL HIGH (ref 1.7–7.7)
Neutrophils Relative %: 84 %
Platelets: 55 10*3/uL — ABNORMAL LOW (ref 150–400)
RBC: 3.03 MIL/uL — ABNORMAL LOW (ref 4.22–5.81)
RDW: 17.1 % — ABNORMAL HIGH (ref 11.5–15.5)
WBC: 10.6 10*3/uL — ABNORMAL HIGH (ref 4.0–10.5)
nRBC: 0 % (ref 0.0–0.2)

## 2018-07-01 LAB — BASIC METABOLIC PANEL
Anion gap: 6 (ref 5–15)
BUN: 30 mg/dL — AB (ref 6–20)
CHLORIDE: 104 mmol/L (ref 98–111)
CO2: 27 mmol/L (ref 22–32)
Calcium: 8.8 mg/dL — ABNORMAL LOW (ref 8.9–10.3)
Creatinine, Ser: 0.89 mg/dL (ref 0.61–1.24)
GFR calc Af Amer: >60 mL/min (ref >60)
GFR calc non Af Amer: >60 mL/min (ref >60)
Glucose, Bld: 168 mg/dL — ABNORMAL HIGH (ref 70–99)
POTASSIUM: 4.4 mmol/L (ref 3.5–5.1)
SODIUM: 137 mmol/L (ref 135–145)

## 2018-07-01 LAB — BPAM PLATELET PHERESIS
Blood Product Expiration Date: 201910182359
ISSUE DATE / TIME: 201910181334
Unit Type and Rh: 6200

## 2018-07-01 LAB — APTT: aPTT: 26 seconds (ref 24–36)

## 2018-07-01 LAB — PREPARE PLATELET PHERESIS: Unit division: 0

## 2018-07-01 LAB — GLUCOSE, CAPILLARY
GLUCOSE-CAPILLARY: 117 mg/dL — AB (ref 70–99)
Glucose-Capillary: 144 mg/dL — ABNORMAL HIGH (ref 70–99)
Glucose-Capillary: 140 mg/dL — ABNORMAL HIGH (ref 70–99)
Glucose-Capillary: 134 mg/dL — ABNORMAL HIGH (ref 70–99)

## 2018-07-01 SURGERY — POSTERIOR CERVICAL LAMINECTOMY FOR EPIDURAL ABSCESS
Anesthesia: General | Site: Spine Cervical

## 2018-07-01 MED ORDER — MEPERIDINE HCL 50 MG/ML IJ SOLN: 6.2500 mg | INTRAMUSCULAR | Status: DC | PRN

## 2018-07-01 MED ORDER — DEXTROSE 5 % IV SOLN: INTRAVENOUS | Status: DC | PRN

## 2018-07-01 MED ORDER — FUROSEMIDE 10 MG/ML IJ SOLN
40.0000 mg | Freq: Once | INTRAMUSCULAR | Status: AC
  Administered 2018-07-01: 40 mg via INTRAVENOUS
  Filled 2018-07-01: qty 4

## 2018-07-01 MED ORDER — LACTATED RINGERS IV SOLN: INTRAVENOUS | Status: DC

## 2018-07-01 MED ORDER — SUCCINYLCHOLINE CHLORIDE 200 MG/10ML IV SOSY
PREFILLED_SYRINGE | INTRAVENOUS | Status: DC | PRN
  Administered 2018-07-01: 160 mg via INTRAVENOUS

## 2018-07-01 MED ORDER — MIDAZOLAM HCL 2 MG/2ML IJ SOLN
INTRAMUSCULAR | Status: AC
  Filled 2018-07-01: qty 2

## 2018-07-01 MED ORDER — HYDROCODONE-HOMATROPINE 5-1.5 MG/5ML PO SYRP
5.0000 mL | ORAL_SOLUTION | ORAL | Status: DC | PRN
  Administered 2018-07-02 – 2018-08-26 (×44): 5 mL via ORAL
  Filled 2018-07-01 (×52): qty 5

## 2018-07-01 MED ORDER — BACITRACIN ZINC 500 UNIT/GM EX OINT
TOPICAL_OINTMENT | CUTANEOUS | Status: AC
  Filled 2018-07-01: qty 28.35

## 2018-07-01 MED ORDER — FENTANYL CITRATE (PF) 100 MCG/2ML IJ SOLN
INTRAMUSCULAR | Status: DC | PRN
  Administered 2018-07-01: 150 ug via INTRAVENOUS

## 2018-07-01 MED ORDER — THROMBIN 5000 UNITS EX SOLR
OROMUCOSAL | Status: DC | PRN
  Administered 2018-07-01: 23:00:00 via TOPICAL

## 2018-07-01 MED ORDER — ONDANSETRON HCL 4 MG/2ML IJ SOLN
INTRAMUSCULAR | Status: DC | PRN
  Administered 2018-07-01: 4 mg via INTRAVENOUS

## 2018-07-01 MED ORDER — SODIUM CHLORIDE 0.9% IV SOLUTION
Freq: Once | INTRAVENOUS | Status: AC
  Administered 2018-07-01: 16:00:00 via INTRAVENOUS

## 2018-07-01 MED ORDER — THROMBIN 5000 UNITS EX SOLR
CUTANEOUS | Status: AC
  Filled 2018-07-01: qty 5000

## 2018-07-01 MED ORDER — HEMOSTATIC AGENTS (NO CHARGE) OPTIME
TOPICAL | Status: DC | PRN
  Administered 2018-07-01: 1 via TOPICAL

## 2018-07-01 MED ORDER — 0.9 % SODIUM CHLORIDE (POUR BTL) OPTIME
TOPICAL | Status: DC | PRN
  Administered 2018-07-01: 1000 mL

## 2018-07-01 MED ORDER — SODIUM CHLORIDE 0.9% FLUSH: 10.0000 mL | INTRAVENOUS | Status: DC | PRN

## 2018-07-01 MED ORDER — PROMETHAZINE HCL 25 MG/ML IJ SOLN: 6.2500 mg | INTRAMUSCULAR | Status: DC | PRN

## 2018-07-01 MED ORDER — FENTANYL CITRATE (PF) 100 MCG/2ML IJ SOLN: 25.0000 ug | INTRAMUSCULAR | Status: DC | PRN

## 2018-07-01 MED ORDER — LIDOCAINE-EPINEPHRINE 1 %-1:100000 IJ SOLN
INTRAMUSCULAR | Status: AC
  Filled 2018-07-01: qty 1

## 2018-07-01 MED ORDER — DEXAMETHASONE SODIUM PHOSPHATE 4 MG/ML IJ SOLN
8.0000 mg | Freq: Four times a day (QID) | INTRAMUSCULAR | Status: DC
  Administered 2018-07-01 – 2018-07-15 (×53): 8 mg via INTRAVENOUS
  Filled 2018-07-01 (×54): qty 2

## 2018-07-01 MED ORDER — HYDROCOD POLST-CPM POLST ER 10-8 MG/5ML PO SUER: 5.0000 mL | Freq: Two times a day (BID) | ORAL | Status: DC | PRN

## 2018-07-01 MED ORDER — PROPOFOL 10 MG/ML IV BOLUS
INTRAVENOUS | Status: DC | PRN
  Administered 2018-07-01: 200 mg via INTRAVENOUS

## 2018-07-01 MED ORDER — PHENYLEPHRINE HCL 10 MG/ML IJ SOLN
INTRAMUSCULAR | Status: DC | PRN
  Administered 2018-07-01 (×2): 120 ug via INTRAVENOUS
  Administered 2018-07-01: 80 ug via INTRAVENOUS

## 2018-07-01 MED ORDER — LORAZEPAM 2 MG/ML IJ SOLN
1.0000 mg | Freq: Once | INTRAMUSCULAR | Status: AC
  Administered 2018-07-01: 1 mg via INTRAVENOUS
  Filled 2018-07-01: qty 1

## 2018-07-01 MED ORDER — LACTATED RINGERS IV SOLN
INTRAVENOUS | Status: DC | PRN
  Administered 2018-07-01: 21:00:00 via INTRAVENOUS

## 2018-07-01 MED ORDER — SODIUM CHLORIDE 0.9 % IV SOLN
10.0000 mL/h | Freq: Once | INTRAVENOUS | Status: AC
  Administered 2018-07-06: 10 mL/h via INTRAVENOUS

## 2018-07-01 MED ORDER — SODIUM CHLORIDE 0.9 % IV SOLN
20.0000 ug | Freq: Once | INTRAVENOUS | Status: AC
  Administered 2018-07-01: 20 ug via INTRAVENOUS
  Filled 2018-07-01 (×2): qty 5

## 2018-07-01 MED ORDER — SODIUM CHLORIDE 0.9 % IV SOLN
INTRAVENOUS | Status: DC | PRN
  Administered 2018-07-01: 50 ug/min via INTRAVENOUS

## 2018-07-01 MED ORDER — SODIUM CHLORIDE 0.9 % IV SOLN
INTRAVENOUS | Status: DC | PRN
  Administered 2018-07-01: 23:00:00

## 2018-07-01 MED ORDER — SODIUM CHLORIDE 0.9% FLUSH
10.0000 mL | Freq: Two times a day (BID) | INTRAVENOUS | Status: DC
  Administered 2018-07-01: 10 mL

## 2018-07-01 MED ORDER — LIDOCAINE 2% (20 MG/ML) 5 ML SYRINGE
INTRAMUSCULAR | Status: DC | PRN
  Administered 2018-07-01: 40 mg via INTRAVENOUS

## 2018-07-01 MED ORDER — FENTANYL CITRATE (PF) 250 MCG/5ML IJ SOLN
INTRAMUSCULAR | Status: AC
  Filled 2018-07-01: qty 5

## 2018-07-01 MED ORDER — PROPOFOL 10 MG/ML IV BOLUS
INTRAVENOUS | Status: AC
  Filled 2018-07-01: qty 20

## 2018-07-01 MED ORDER — DEXTROSE 5 % IV SOLN
INTRAVENOUS | Status: DC | PRN
  Administered 2018-07-01: 3 g via INTRAVENOUS

## 2018-07-01 SURGICAL SUPPLY — 46 items
BAG DECANTER FOR FLEXI CONT (MISCELLANEOUS) ×2 IMPLANT
BUR MATCHSTICK NEURO 3.0 LAGG (BURR) IMPLANT
BUR PRECISION FLUTE 5.0 (BURR) IMPLANT
CANISTER SUCT 3000ML PPV (MISCELLANEOUS) ×4 IMPLANT
COVER WAND RF STERILE (DRAPES) ×2 IMPLANT
DERMABOND ADVANCED (GAUZE/BANDAGES/DRESSINGS) ×1
DERMABOND ADVANCED .7 DNX12 (GAUZE/BANDAGES/DRESSINGS) ×1 IMPLANT
DRAIN CHANNEL 7F 3/4 FLAT (WOUND CARE) ×2 IMPLANT
DRAPE LAPAROTOMY 100X72 PEDS (DRAPES) ×2 IMPLANT
DRSG OPSITE POSTOP 4X6 (GAUZE/BANDAGES/DRESSINGS) ×2 IMPLANT
DURAPREP 6ML APPLICATOR 50/CS (WOUND CARE) ×2 IMPLANT
ELECT REM PT RETURN 9FT ADLT (ELECTROSURGICAL) ×2
ELECTRODE REM PT RTRN 9FT ADLT (ELECTROSURGICAL) ×1 IMPLANT
EVACUATOR SILICONE 100CC (DRAIN) ×2 IMPLANT
GAUZE 4X4 16PLY RFD (DISPOSABLE) IMPLANT
GAUZE SPONGE 4X4 12PLY STRL (GAUZE/BANDAGES/DRESSINGS) IMPLANT
GLOVE BIO SURGEON STRL SZ7 (GLOVE) ×2 IMPLANT
GLOVE BIOGEL PI IND STRL 7.5 (GLOVE) ×2 IMPLANT
GLOVE BIOGEL PI IND STRL 8 (GLOVE) ×1 IMPLANT
GLOVE BIOGEL PI INDICATOR 7.5 (GLOVE) ×2
GLOVE BIOGEL PI INDICATOR 8 (GLOVE) ×1
GLOVE ECLIPSE 7.0 STRL STRAW (GLOVE) ×2 IMPLANT
GLOVE ECLIPSE 7.5 STRL STRAW (GLOVE) ×2 IMPLANT
GOWN STRL REUS W/ TWL LRG LVL3 (GOWN DISPOSABLE) ×1 IMPLANT
GOWN STRL REUS W/ TWL XL LVL3 (GOWN DISPOSABLE) ×2 IMPLANT
GOWN STRL REUS W/TWL 2XL LVL3 (GOWN DISPOSABLE) ×2 IMPLANT
GOWN STRL REUS W/TWL LRG LVL3 (GOWN DISPOSABLE) ×1
GOWN STRL REUS W/TWL XL LVL3 (GOWN DISPOSABLE) ×2
HEMOSTAT POWDER KIT SURGIFOAM (HEMOSTASIS) ×2 IMPLANT
KIT BASIN OR (CUSTOM PROCEDURE TRAY) ×2 IMPLANT
KIT TURNOVER KIT B (KITS) ×2 IMPLANT
NEEDLE HYPO 22GX1.5 SAFETY (NEEDLE) ×2 IMPLANT
NS IRRIG 1000ML POUR BTL (IV SOLUTION) ×2 IMPLANT
PACK LAMINECTOMY NEURO (CUSTOM PROCEDURE TRAY) ×2 IMPLANT
PAD ARMBOARD 7.5X6 YLW CONV (MISCELLANEOUS) ×6 IMPLANT
SPONGE LAP 4X18 RFD (DISPOSABLE) IMPLANT
STAPLER VISISTAT 35W (STAPLE) ×2 IMPLANT
SUT ETHILON 3 0 FSL (SUTURE) ×2 IMPLANT
SUT MNCRL AB 3-0 PS2 18 (SUTURE) ×2 IMPLANT
SUT VIC AB 0 CT1 18XCR BRD8 (SUTURE) ×1 IMPLANT
SUT VIC AB 0 CT1 8-18 (SUTURE) ×1
SUT VIC AB 2-0 CT1 18 (SUTURE) ×2 IMPLANT
TOWEL GREEN STERILE (TOWEL DISPOSABLE) ×2 IMPLANT
TOWEL GREEN STERILE FF (TOWEL DISPOSABLE) ×2 IMPLANT
UNDERPAD 30X30 (UNDERPADS AND DIAPERS) ×2 IMPLANT
WATER STERILE IRR 1000ML POUR (IV SOLUTION) ×2 IMPLANT

## 2018-07-01 NOTE — Progress Notes (Signed)
PROGRESS NOTE    [name] [name]  ZOX:[id number] DOB: [birth year] DOA: 06/06/2018 PCP: Devra Dopp, MD   Brief Narrative:  52 year old man with past medical history relevant for gout, hypertension anterior cervical corpectomy of C4 on 03/24/2018 with comp gated postoperative course including development of multiple PEs and DVTs started on anticoagulation and subsequently developed hematoma requiring reexploration on 03/28/2018 status post placement of IVC filter who was discharged to skilled nursing facility and had a complicated course including progressive lower extremity weakness, intermittent fevers of unclear etiology and hospitalizations for "pneumonia".  Patient was admitted to this hospital is a transfer from [facility] with MRI of C-spine that showed postoperative changes and cord compression with cord edema and concern for ischemia.  Patient is status post posterior cervical decompression laminectomy of C3, C4, C5, removal of spinous processes, foraminotomies on 06/14/2018.  On 06/22/2018 patient began to develop worsening left sided paresthesias and weakness and a stat MRI showed evidence of postoperative hematoma.  Patient was taken to the OR with evacuation of hematoma.  Additionally his postoperative course has been complicated by recurrent fevers, bilateral lower extremity DVTs on ultrasound, migratory arthralgias/arthritis that are thought to be rheumatologic in nature as well as significant orthostasis limiting the amount he can work with physical therapy.  Today with decreased left sided hand grip. Neurosurgery ordering MRI. Overnight, PICC became dislodged.   Assessment & Plan   Near syncope due to orthostatic hypotension -Likely combination of hypovolemia as well as autonomic dysfunction -Patient was given IV fluids and placed on TED hose -He eventually was started on midodrine -Echocardiogram July 2019 showed a normal systolic function, grade 2 diastolic dysfunction.   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 -Neurosurgery consulted and appreciated -Patient was noted to have left-sided weakness and numbness -Status post cervical decompressive surgery on 06/14/2018 -Status post evacuation of large hematoma and placement of Hemovac drain on 06/22/2018 -Continue IV dexamethasone per neurosurgery -Pending SNF vs CIR (not sure if patient could withstand CIR, however, feel he needs more monitoring than can be provided at SNF; will speak with case management regarding LTAC?) -patient with left sided hand grip weakness, MRI ordered- pending those results, patient may need surgery- neurosurgery aware of  Thrombocytopenia/coagulopathy -Neurosurgery concerned about coagulopathy due to recurrent hematomas -Hematology oncology consulted and appreciated, recommended argatroban (currently holding off on starting) -Cardiolipin antibody negative -Lupus anticoagulant, homocysteine level, factor VIII within normal limits -Protein S activity 60 (low) -Protein C total >200; Activity 199 (high)  -unfortunately, drain was pulled on 10/17, patient experienced bleeding on 10/18 -1 unit platelets ordered for transfusion on 10/18 -HIT panel pending  -immature platelet fraction WNL -platelets today 55  Polyarthralgia/persistent fever -Patient had symptoms in the shoulders and ankles, along with a history of gout and pain in his knees.  He underwent arthrocentesis however no evidence of infection was found. -He was noted to have elevated inflammatory markers including CCP, CRP.  However ANCA, aldolase, ANA were negative. -Previous rounding physician discussed with Dr. Deanne Coffer, rheumatologist, he will follow-up with the patient as an outpatient. -Patient was placed on prednisone however currently on dexamethasone (neurosurgery) -No longer having fevers  Cough -mild worsening -continue tessalon -will order hycodan q4h  PRN  Mild acute renal failure -resolved  Recent PE /DVT in setting of spine surgery -Status post IVC filter placement on March 28, 2018 -Patient was on apixaban however changed over to IV heparin.  Anticoagulation however was discontinued for surgery.  Patient also noted to have thrombocytopenia. -Vascular DVT US 06/26/2018 showed bilateral lower extremity DVTs likely acute -Hematology consulted and appreciated, recommended Argatroban.  However neurosurgery is wanting to hold any type of anticoagulation for an additional 3 days -Discussed with vascular surgery, Dr. Myra Gianotti, patient is out of the 3 week window for treatment, he would have to get TPA, which is contraindicated in a bleeding patient.  -Feels LE are swollen, will give dose of IV lasix today  Essential hypertension -Patient was on amlodipine however was noted to have lower extremity edema and amlodipine was discontinued.  He was also noted to have orthostatic hypotension as noted above.  Gout -Does not seem to be active at this time, continue Uloric  Normocytic anemia -Currently hemoglobin 8.7, continue to monitor CBC  DVT Prophylaxis  SCDs  Code Status: Full  Family Communication: None at bedside.  Disposition Plan: Admitted. Dispo pending. Patient currently not medically stable for discharge.   Consultants Neurosurgery Orthopedic surgery Infectious disease  Inpatient rehab Vascular surgery , Dr. Myra Gianotti, via phone  Procedures  On 10/2 1. posterior cervical decompressive laminectomy at C3, C4, C5 with removal of the spinous process and complete laminectomy and foraminotomies of the C3, C4, C5 nerve roots. 2. Posterior cervical fusion with lateral mass screws at C3-C4-C5 and pars screws at C2 lies in the globus ellipseLateral mass screw system 3. Posterior lateral arthrodesis C2-C5 utilizing locally harvested autograft mixed with vivigen  06/22/2018: Procedure reexploration posterior cervical wound for  evacuation of epidural hematoma  06/26/2018 DVT ultrasound:Right: Findings consistent with acute deep vein thrombosis involving the right common femoral vein, right femoral vein, right popliteal vein, right posterior tibial vein, and right peroneal vein. Thrombosis extends proximally into external iliac vein.  Proximal iliac vein not visualized. Left: Findings consistent with acute deep vein thrombosis involving the left common femoral vein, left femoral vein, left popliteal vein, left posterior tibial vein, and left peroneal vein. Findings consistent with acute superficial vein thrombosis  involving the left great saphenous vein. Thrombosis extends proximally into external iliac vein. Proximal iliac vein not visualized.  Antibiotics   Anti-infectives (From admission, onward)   Start     Dose/Rate Route Frequency Ordered Stop   06/30/18 1330  doxycycline (VIBRA-TABS) tablet 100 mg     100 mg Oral Every 12 hours 06/30/18 1329     06/27/18 1000  fluconazole (DIFLUCAN) tablet 200 mg     200 mg Oral Daily 06/27/18 0725     06/22/18 2315  ceFAZolin (ANCEF) IVPB 2g/100 mL premix     2 g 200 mL/hr over 30 Minutes Intravenous Every 8 hours 06/22/18 2314 06/27/18 1519   06/22/18 1828  bacitracin 50,000 Units in sodium chloride 0.9 % 500 mL irrigation  Status:  Discontinued       As needed 06/22/18 1828 06/22/18 1915   06/22/18 1700  ceFAZolin (ANCEF) 3 g in dextrose 5 % 50 mL IVPB  Status:  Discontinued     3 g 100 mL/hr over 30 Minutes Intravenous  Once 06/22/18 1647 06/22/18 2101   06/14/18 2100  ceFAZolin (ANCEF) IVPB 2g/100 mL premix     2 g 200 mL/hr over 30 Minutes Intravenous Every 8 hours 06/14/18 1712 06/17/18 0800   06/14/18 1414  bacitracin 50,000 Units in sodium chloride 0.9 % 500 mL irrigation  Status:  Discontinued       As needed 06/14/18 1414 06/14/18 1647   06/14/18 1215  ceFAZolin (ANCEF) 3 g in dextrose 5 % 50  mL IVPB     3 g 160 mL/hr over 30 Minutes Intravenous To ShortStay  Surgical 06/14/18 1202 06/14/18 2225   06/14/18 1200  ceFAZolin (ANCEF) IVPB 2g/100 mL premix  Status:  Discontinued     2 g 200 mL/hr over 30 Minutes Intravenous On call to O.R. 06/14/18 1157 06/14/18 2140      Subjective:   [name] [name] seen and examined today.  Patient feels miserable this morning. PICC line was dislodged overnight. Today he complains of weakness with left hand grip and moving left arm. Complains of cough. Denies chest pain, shortness of breath, abdominal pain, N/V/D/C.   Objective:   Vitals:   06/30/18 1900 06/30/18 2300 07/01/18 0300 07/01/18 0758  BP: 118/87 (!) 143/91 139/87 (!) 136/91  Pulse: 97 79 78 66  Resp: 19 19 16 16   Temp: 98.6 F (37 C) 98.3 F (36.8 C) 98.1 F (36.7 C) 98.3 F (36.8 C)  TempSrc: Oral Oral Oral Oral  SpO2: 96% 98% 98% 100%  Weight:      Height:        Intake/Output Summary (Last 24 hours) at 07/01/2018 1047 Last data filed at 07/01/2018 0300 Gross per 24 hour  Intake 669.5 ml  Output 2050 ml  Net -1380.5 ml   Filed Weights   06/06/18 2246 06/14/18 1151 06/22/18 1641  Weight: 123.8 kg 123.8 kg 123.8 kg   Exam  General: Well developed, well nourished, obese, NAD  HEENT: NCAT, mucous membranes moist.   Neck: Collar in place  Cardiovascular: S1 S2 auscultated, no rubs, murmurs or gallops. Regular rate and rhythm.  Respiratory: Clear to auscultation bilaterally with equal chest rise  Abdomen: Soft, obese, nontender, nondistended, + bowel sounds  Extremities: warm dry without cyanosis clubbing. Trace LE edema, ted hose in place   Neuro: AAOx3, nonfocal. LUE weakness   Psych: Depressed, however appropriate  Data Reviewed: I have personally reviewed following labs and imaging studies  CBC: Recent Labs  Lab 06/27/18 0630 06/28/18 0500 06/29/18 0646 06/30/18 0500 07/01/18 0606  WBC 11.9* 13.5* 12.8* 11.6* 10.6*  NEUTROABS 10.1* 11.6* 10.8* 10.0* 8.9*  HGB 9.6* 9.1* 9.3* 8.7* 8.7*  HCT 31.0* 30.9* 31.3*  28.7* 28.9*  MCV 95.1 96.3 96.3 96.6 95.4  PLT 74* 65* 65* 56* 55*   Basic Metabolic Panel: Recent Labs  Lab 06/24/18 1245 06/25/18 0555 07/01/18 0606  NA 133* 134* 137  K 4.6 4.7 4.4  CL 101 100 104  CO2 27 27 27   GLUCOSE 131* 194* 168*  BUN 29* 30* 30*  CREATININE 0.84 0.92 0.89  CALCIUM 9.0 8.9 8.8*   GFR: Estimated Creatinine Clearance: 126.2 mL/min (by C-G formula based on SCr of 0.89 mg/dL). Liver Function Tests: No results for input(s): AST, ALT, ALKPHOS, BILITOT, PROT, ALBUMIN in the last 168 hours. No results for input(s): LIPASE, AMYLASE in the last 168 hours. No results for input(s): AMMONIA in the last 168 hours. Coagulation Profile: Recent Labs  Lab 06/27/18 0630 06/30/18 1059 07/01/18 0854  INR 1.23 1.33 1.20   Cardiac Enzymes: No results for input(s): CKTOTAL, CKMB, CKMBINDEX, TROPONINI in the last 168 hours. BNP (last 3 results) No results for input(s): PROBNP in the last 8760 hours. HbA1C: No results for input(s): HGBA1C in the last 72 hours. CBG: Recent Labs  Lab 06/30/18 0735 06/30/18 1110 06/30/18 1725 06/30/18 2047 07/01/18 0756  GLUCAP 159* 171* 163* 183* 144*   Lipid Profile: No results for input(s): CHOL, HDL, LDLCALC, TRIG, CHOLHDL, LDLDIRECT in the  last 72 hours. Thyroid Function Tests: No results for input(s): TSH, T4TOTAL, FREET4, T3FREE, THYROIDAB in the last 72 hours. Anemia Panel: No results for input(s): VITAMINB12, FOLATE, FERRITIN, TIBC, IRON, RETICCTPCT in the last 72 hours. Urine analysis:    Component Value Date/Time   COLORURINE AMBER (A) 06/07/2018 0900   APPEARANCEUR CLOUDY (A) 06/07/2018 0900   LABSPEC 1.028 06/07/2018 0900   PHURINE 5.0 06/07/2018 0900   GLUCOSEU NEGATIVE 06/07/2018 0900   HGBUR SMALL (A) 06/07/2018 0900   BILIRUBINUR NEGATIVE 06/07/2018 0900   KETONESUR 5 (A) 06/07/2018 0900   PROTEINUR 30 (A) 06/07/2018 0900   NITRITE NEGATIVE 06/07/2018 0900   LEUKOCYTESUR NEGATIVE 06/07/2018 0900    Sepsis Labs: @LABRCNTIP (procalcitonin:4,lacticidven:4)  )No results found for this or any previous visit (from the past 240 hour(s)).    Radiology Studies: [place] Ekg Site Rite  Result Date: 07/01/2018 If [facility] image not attached, placement could not be confirmed due to current cardiac rhythm.  [place] Ekg Site Rite  Result Date: 07/01/2018 If Site Rite image not attached, placement could not be confirmed due to current cardiac rhythm.    Scheduled Meds: . sodium chloride   Intravenous Once  . sodium chloride   Intravenous Once  . dexamethasone  8 mg Intravenous Q6H  . docusate sodium  100 mg Oral Daily  . doxycycline  100 mg Oral Q12H  . febuxostat  40 mg Oral Daily  . fluconazole  200 mg Oral Daily  . furosemide  40 mg Intravenous Once  . gabapentin  300 mg Oral TID  . Influenza vac split quadrivalent PF  0.5 mL Intramuscular Tomorrow-1000  . insulin aspart  0-5 Units Subcutaneous QHS  . insulin aspart  0-9 Units Subcutaneous TID WC  . LORazepam  1 mg Intravenous Once  . methocarbamol  750 mg Oral QID  . midodrine  5 mg Oral TID WC  . nystatin  5 mL Oral TID AC & HS  . pantoprazole  40 mg Oral Daily  . polyethylene glycol  17 g Oral BID  . senna-docusate  1 tablet Oral QHS  . sodium chloride flush  10-40 mL Intracatheter Q12H   Continuous Infusions: . lactated ringers 10 mL/hr at 06/24/18 1114     LOS: 24 days   Time Spent in minutes   45 minutes (greater than 50% of time spent with patient face to face, as well as reviewing records, calling consults, and formulating a plan)  Edsel Petrin D.O. on 07/01/2018 at 10:47 AM  Between 7am to 7pm - Please see pager noted on [email or web address]  After 7pm go to [email or web address]  And look for the night coverage person covering for me after hours  [facility] Office  [phone]

## 2018-07-01 NOTE — Progress Notes (Signed)
Spoke with Johnny Bridge, RN concerning PICC placement. Considering that the patient is receiving one intravenous medication, could VAST simply place a PIV or midline. She agreed and will place a consult for access other than PICC.

## 2018-07-01 NOTE — Anesthesia Procedure Notes (Signed)
Procedure Name: Intubation Date/Time: 07/01/2018 9:43 PM Performed by: Sheppard Evens, CRNA Pre-anesthesia Checklist: Patient identified, Emergency Drugs available, Suction available and Patient being monitored Patient Re-evaluated:Patient Re-evaluated prior to induction Oxygen Delivery Method: Circle System Utilized Preoxygenation: Pre-oxygenation with 100% oxygen Induction Type: IV induction and Rapid sequence Laryngoscope Size: Glidescope Grade View: Grade I Tube type: Oral Tube size: 7.5 mm Number of attempts: 1 Airway Equipment and Method: Stylet and Oral airway Placement Confirmation: ETT inserted through vocal cords under direct vision,  positive ETCO2 and breath sounds checked- equal and bilateral Secured at: 23 cm Tube secured with: Tape Dental Injury: Teeth and Oropharynx as per pre-operative assessment

## 2018-07-01 NOTE — Op Note (Signed)
PATIENT: [name] [name]  DAY OF SURGERY: 07/01/18   PRE-OPERATIVE DIAGNOSIS:  Cervical epidural hematoma   POST-OPERATIVE DIAGNOSIS:  Cervical epidural hematoma   PROCEDURE:  Cervical wound revision with evacuation of cervical epidural hematoma   SURGEON:  Surgeon(s) and Role:    Jadene Pierini, MD - Primary   ANESTHESIA: ETGA   BRIEF HISTORY: This is a 52yo man who has unfortunately had a complicated course following a posterior cervical decompression and fusion. He previously had a compressive hematoma that had to be evacuated. Today, he began having weakness in his left hand. A stat MRI was ordered, which showed an epidural hematoma with severe cord compression and signal change. Upon completion of the MRI, I re-evaluated the patient and his exam had continued to decline, he was unable to move his left hand and arm and had minimal function in his right hand. We discussed the clinical situation and the need for emergent hematoma evacuation and he wished to proceed. He was transfused 2U of platelets and given a dose of DDAVP in hopes of maximizing hemostasis. Activated and non-activated recombinant factors were not used because of his existing extensive DVTs/PEs.   OPERATIVE DETAIL: The patient was taken to the operating room and placed on the OR table in the prone position. A formal time out was performed with two patient identifiers and confirmed the operative site. Anesthesia was induced by the anesthesia team. The operative site was marked, the area was then prepped and draped in a sterile fashion. The prior midline cervical incision was re-opened with immediate egress of dark hematoma under very high pressure. The hematoma was evacuated completely. There were a few sites of oozing, but no visible arterial or venous source of the hemorrhage. Hemostasis was obtained from the oozing sites and a #7 round drain was tunneled and secured. Hemostasis was again re-confirmed. The epidural gutters  were prophylactically lined with floseal hemostatic agent. The walls of the surgical cavity were lined with surgicel to maximize hemostasis.   All instrument and sponge counts were correct, the incision was then closed in layers. The patient was then returned to anesthesia for emergence. No apparent complications at the completion of the procedure.   EBL:    DRAINS: #7 round drain in subfascial space   SPECIMENS: none   Jadene Pierini, MD 07/01/18 9:51 PM

## 2018-07-01 NOTE — Progress Notes (Signed)
PICC line was pulled out approximately 50%. Removed PICC line and placed dressing.

## 2018-07-01 NOTE — Anesthesia Preprocedure Evaluation (Addendum)
Anesthesia Evaluation  Patient identified by MRN, date of birth, ID band Patient awake    Reviewed: Allergy & Precautions, NPO status , Patient's Chart, lab work & pertinent test results  Airway Mallampati: III  TM Distance: >3 FB Neck ROM: Limited    Dental  (+) Teeth Intact, Dental Advisory Given   Pulmonary    breath sounds clear to auscultation       Cardiovascular hypertension, Pt. on medications  Rhythm:Regular Rate:Normal     Neuro/Psych Anxiety    GI/Hepatic negative GI ROS, Neg liver ROS,   Endo/Other  negative endocrine ROS  Renal/GU negative Renal ROS     Musculoskeletal  (+) Arthritis ,   Abdominal (+) + obese,   Peds  Hematology negative hematology ROS (+)   Anesthesia Other Findings Able to move RUE only.   Reproductive/Obstetrics                            Lab Results  Component Value Date   WBC 10.6 (H) 07/01/2018   HGB 8.7 (L) 07/01/2018   HCT 28.9 (L) 07/01/2018   MCV 95.4 07/01/2018   PLT 55 (L) 07/01/2018   Lab Results  Component Value Date   CREATININE 0.89 07/01/2018   BUN 30 (H) 07/01/2018   NA 137 07/01/2018   K 4.4 07/01/2018   CL 104 07/01/2018   CO2 27 07/01/2018   Lab Results  Component Value Date   INR 1.20 07/01/2018   INR 1.33 06/30/2018   INR 1.23 06/27/2018    Echo: - Left ventricle: The cavity size was mildly dilated. Wall   thickness was normal. Systolic function was normal. The estimated   ejection fraction was in the range of 50% to 55%. Wall motion was   normal; there were no regional wall motion abnormalities.   Features are consistent with a pseudonormal left ventricular   filling pattern, with concomitant abnormal relaxation and   increased filling pressure (grade 2 diastolic dysfunction).   Anesthesia Physical Anesthesia Plan  ASA: III and emergent  Anesthesia Plan: General   Post-op Pain Management:    Induction:  Intravenous  PONV Risk Score and Plan: 3 and Midazolam, Ondansetron, Dexamethasone and Treatment may vary due to age or medical condition  Airway Management Planned: Oral ETT and Video Laryngoscope Planned  Additional Equipment: None  Intra-op Plan:   Post-operative Plan: Possible Post-op intubation/ventilation  Informed Consent: I have reviewed the patients History and Physical, chart, labs and discussed the procedure including the risks, benefits and alternatives for the proposed anesthesia with the patient or authorized representative who has indicated his/her understanding and acceptance.   Dental advisory given  Plan Discussed with: CRNA  Anesthesia Plan Comments:       Anesthesia Quick Evaluation

## 2018-07-01 NOTE — Progress Notes (Deleted)
Spoke with French Ana, RN, concerning PICC placement. Orders for blood cultures this am. Patient now has one PIV and sedation is an issue. Blood cultures haven't been drawn due to agitation. Rozann Lesches, RN, that PICC placement isn't appropriate at this time due to blood cultures to be drawn, fever, and the agitation of the patient. Instructed her to contact primary to request a central line to be placed. VAST team offered to assess to see if another PIV can be placed, but French Ana, RN said patient too agitated to maintain PIV access.

## 2018-07-01 NOTE — Transfer of Care (Signed)
Immediate Anesthesia Transfer of Care Note  Patient: [name] [name]  Procedure(s) Performed: EVACUATION OF POSTERIOR CERVICAL HEMATOMA (N/A Spine Cervical)  Patient Location: PACU  Anesthesia Type:General  Level of Consciousness: responds to stimulation  Airway & Oxygen Therapy: Patient Spontanous Breathing  Post-op Assessment: Report given to RN and Post -op Vital signs reviewed and stable  Post vital signs: Reviewed and stable  Last Vitals:  Vitals Value Taken Time  BP 143/103 07/01/2018 11:28 PM  Temp    Pulse 79 07/01/2018 11:29 PM  Resp 20 07/01/2018 11:29 PM  SpO2 95 % 07/01/2018 11:29 PM  Vitals shown include unvalidated device data.  Last Pain:  Vitals:   07/01/18 2012  TempSrc: Oral  PainSc:       Patients Stated Pain Goal: 4 (06/30/18 0930)  Complications: No apparent anesthesia complications

## 2018-07-01 NOTE — Progress Notes (Signed)
Neurosurgery Service Progress Note  Subjective: No acute events overnight, feels that his left grip is weaker this morning   Objective: Vitals:   06/30/18 1900 06/30/18 2300 07/01/18 0300 07/01/18 0758  BP: 118/87 (!) 143/91 139/87 (!) 136/91  Pulse: 97 79 78 66  Resp: 19 19 16 16   Temp: 98.6 F (37 C) 98.3 F (36.8 C) 98.1 F (36.7 C) 98.3 F (36.8 C)  TempSrc: Oral Oral Oral Oral  SpO2: 96% 98% 98% 100%  Weight:      Height:       Temp (24hrs), Avg:98.5 F (36.9 C), Min:98.1 F (36.7 C), Max:99.3 F (37.4 C)  CBC Latest Ref Rng & Units 07/01/2018 06/30/2018 06/29/2018  WBC 4.0 - 10.5 K/uL 10.6(H) 11.6(H) 12.8(H)  Hemoglobin 13.0 - 17.0 g/dL 4.0(J) 8.1(X) 9.1(Y)  Hematocrit 39.0 - 52.0 % 28.9(L) 28.7(L) 31.3(L)  Platelets 150 - 400 K/uL 55(L) 56(L) 65(L)   BMP Latest Ref Rng & Units 07/01/2018 06/25/2018 06/24/2018  Glucose 70 - 99 mg/dL 782(N) 562(Z) 308(M)  BUN 6 - 20 mg/dL 57(Q) 46(N) 62(X)  Creatinine 0.61 - 1.24 mg/dL 5.28 4.13 2.44  Sodium 135 - 145 mmol/L 137 134(L) 133(L)  Potassium 3.5 - 5.1 mmol/L 4.4 4.7 4.6  Chloride 98 - 111 mmol/L 104 100 101  CO2 22 - 32 mmol/L 27 27 27   Calcium 8.9 - 10.3 mg/dL 0.1(U) 8.9 9.0    Intake/Output Summary (Last 24 hours) at 07/01/2018 0935 Last data filed at 07/01/2018 0300 Gross per 24 hour  Intake 789.5 ml  Output 2050 ml  Net -1260.5 ml    Current Facility-Administered Medications:  .  0.9 %  sodium chloride infusion (Manually program via Guardrails IV Fluids), , Intravenous, Once, Ennever, Rose Phi, MD .  0.9 %  sodium chloride infusion (Manually program via Guardrails IV Fluids), , Intravenous, Once, Edsel Petrin, DO .  acetaminophen (TYLENOL) tablet 650 mg, 650 mg, Oral, Q4H PRN, 650 mg at 06/26/18 2204 **OR** acetaminophen (TYLENOL) suppository 650 mg, 650 mg, Rectal, Q4H PRN, Donalee Citrin, MD .  ALPRAZolam Prudy Feeler) tablet 1 mg, 1 mg, Oral, BID PRN, Donalee Citrin, MD, 1 mg at 06/30/18 2138 .  alum & mag  hydroxide-simeth (MAALOX/MYLANTA) 200-200-20 MG/5ML suspension 30 mL, 30 mL, Oral, Q6H PRN, Donalee Citrin, MD, 30 mL at 06/26/18 2203 .  benzonatate (TESSALON) capsule 100 mg, 100 mg, Oral, TID PRN, Osvaldo Shipper, MD, 100 mg at 07/01/18 0229 .  bisacodyl (DULCOLAX) EC tablet 5 mg, 5 mg, Oral, Daily PRN, Meyran, Tiana Loft, NP .  cyclobenzaprine (FLEXERIL) tablet 10 mg, 10 mg, Oral, TID PRN, Donalee Citrin, MD, 10 mg at 07/01/18 0508 .  dexamethasone (DECADRON) injection 8 mg, 8 mg, Intravenous, Q6H, Donalee Citrin, MD .  docusate sodium (COLACE) capsule 100 mg, 100 mg, Oral, Daily, Meyran, Tiana Loft, NP, 100 mg at 06/30/18 0931 .  doxycycline (VIBRA-TABS) tablet 100 mg, 100 mg, Oral, Q12H, Donalee Citrin, MD, 100 mg at 07/01/18 0837 .  febuxostat (ULORIC) tablet 40 mg, 40 mg, Oral, Daily, Osvaldo Shipper, MD, 40 mg at 07/01/18 0849 .  fluconazole (DIFLUCAN) tablet 200 mg, 200 mg, Oral, Daily, Ennever, Rose Phi, MD, 200 mg at 07/01/18 0849 .  furosemide (LASIX) injection 40 mg, 40 mg, Intravenous, Once, Edsel Petrin, DO .  gabapentin (NEURONTIN) capsule 300 mg, 300 mg, Oral, TID, Osvaldo Shipper, MD, 300 mg at 07/01/18 0837 .  HYDROcodone-homatropine (HYCODAN) 5-1.5 MG/5ML syrup 5 mL, 5 mL, Oral, Q4H PRN, Edsel Petrin, DO .  HYDROmorphone (DILAUDID) injection 1 mg, 1 mg, Intravenous, Q2H PRN, Osvaldo Shipper, MD, 1 mg at 06/23/18 1952 .  Influenza vac split quadrivalent PF (FLUARIX) injection 0.5 mL, 0.5 mL, Intramuscular, Tomorrow-1000, Rito Ehrlich, Gokul, MD .  insulin aspart (novoLOG) injection 0-5 Units, 0-5 Units, Subcutaneous, QHS, Donalee Citrin, MD .  insulin aspart (novoLOG) injection 0-9 Units, 0-9 Units, Subcutaneous, TID WC, Donalee Citrin, MD, 2 Units at 06/30/18 1735 .  lactated ringers infusion, , Intravenous, Continuous, Donalee Citrin, MD, Last Rate: 10 mL/hr at 06/24/18 1114 .  LORazepam (ATIVAN) injection 1 mg, 1 mg, Intravenous, Once, Mikhail, Mokuleia, DO .  menthol-cetylpyridinium  (CEPACOL) lozenge 3 mg, 1 lozenge, Oral, PRN **OR** phenol (CHLORASEPTIC) mouth spray 1 spray, 1 spray, Mouth/Throat, PRN, Donalee Citrin, MD, 1 spray at 06/30/18 0132 .  methocarbamol (ROBAXIN) tablet 750 mg, 750 mg, Oral, QID, Osvaldo Shipper, MD, 750 mg at 07/01/18 0837 .  midodrine (PROAMATINE) tablet 5 mg, 5 mg, Oral, TID WC, Donalee Citrin, MD, 5 mg at 07/01/18 1610 .  morphine 2 MG/ML injection 2 mg, 2 mg, Intravenous, Q4H PRN, Osvaldo Shipper, MD, 2 mg at 06/30/18 0142 .  nystatin (MYCOSTATIN) 100000 UNIT/ML suspension 500,000 Units, 5 mL, Oral, TID AC & HS, Josph Macho, MD, 500,000 Units at 07/01/18 0837 .  ondansetron (ZOFRAN) tablet 4 mg, 4 mg, Oral, Q6H PRN **OR** ondansetron (ZOFRAN) injection 4 mg, 4 mg, Intravenous, Q6H PRN, Donalee Citrin, MD .  oxyCODONE (Oxy IR/ROXICODONE) immediate release tablet 15 mg, 15 mg, Oral, Q3H PRN, Osvaldo Shipper, MD, 15 mg at 07/01/18 0327 .  oxyCODONE (Oxy IR/ROXICODONE) immediate release tablet 20 mg, 20 mg, Oral, Q3H PRN, Osvaldo Shipper, MD, 20 mg at 07/01/18 0849 .  pantoprazole (PROTONIX) EC tablet 40 mg, 40 mg, Oral, Daily, Donalee Citrin, MD, 40 mg at 07/01/18 0849 .  polyethylene glycol (MIRALAX / GLYCOLAX) packet 17 g, 17 g, Oral, BID, Osvaldo Shipper, MD, 17 g at 06/30/18 0931 .  senna-docusate (Senokot-S) tablet 1 tablet, 1 tablet, Oral, QHS, Meyran, Tiana Loft, NP, 1 tablet at 06/29/18 2202 .  sodium chloride flush (NS) 0.9 % injection 10-40 mL, 10-40 mL, Intracatheter, Q12H, Josph Macho, MD, 10 mL at 06/30/18 2117 .  sodium chloride flush (NS) 0.9 % injection 10-40 mL, 10-40 mL, Intracatheter, PRN, Josph Macho, MD .  sodium chloride flush (NS) 0.9 % injection 3 mL, 3 mL, Intravenous, PRN, Donalee Citrin, MD, 3 mL at 06/29/18 1015   Physical Exam: AOx3, PERRL, gaze neutral, FS,  Diffusely 4+/5 except proximally 3/5 in the LUE Assessment & Plan: 52 y.o. man with myelopathy and recurrent post-operative hematomas s/p evacuation, unclear  hematologic issue with thrombocytopenia, DVTs, PE, coagulopathy. -strength worse this morning, stat MRI, keep NPO, possible hematoma evac pending MRI results  Jadene Pierini  07/01/18 9:35 AM

## 2018-07-01 NOTE — Brief Op Note (Signed)
07/01/2018  11:26 PM  PATIENT:  Trever Benett  52 y.o. male  PRE-OPERATIVE DIAGNOSIS:   Posterior cervical epidural hematoma  POST-OPERATIVE DIAGNOSIS:   Posterior cervical epidural hematoma  PROCEDURE:  Procedure(s): EVACUATION OF POSTERIOR CERVICAL HEMATOMA (N/A)  SURGEON:  Surgeon(s) and Role:    * Alvy Alsop, Clovis Pu, MD - Primary  ANESTHESIA:   general  EBL:  100 mL   BLOOD ADMINISTERED:none  DRAINS: #7 round drain in epidural space   LOCAL MEDICATIONS USED:  NONE  SPECIMEN:  No Specimen  DISPOSITION OF SPECIMEN:  N/A  COUNTS:  YES  TOURNIQUET:  * No tourniquets in log *  DICTATION: .Note written in EPIC  PLAN OF CARE: Admit to inpatient   PATIENT DISPOSITION:  PACU - hemodynamically stable.   Delay start of Pharmacological VTE agent (>24hrs) due to surgical blood loss or risk of bleeding: yes

## 2018-07-01 NOTE — Progress Notes (Signed)
RN came in to administer Decadron and PICC cath was sticking out of dressing, stat lock still in place. Notified on-call provider and entered IV consult. Pt states that RT hand is feeling numb. Tested grips again and slightly weaker on the RT. IV team pulled PICC and placed dressing. New orders entered for reinsertion and [place]. Will continue to monitor.   Pt states RT hand is back to baseline.  NT called RN into room to speak with pt. Pt states his LT arm is now weak. Grips are intact. Only change is pt states he cannot lift arm above head as before. On-call provider notified. Advised to continue to monitor and pt will be rounded on this AM. No new orders.

## 2018-07-02 ENCOUNTER — Inpatient Hospital Stay (HOSPITAL_COMMUNITY)

## 2018-07-02 ENCOUNTER — Encounter (HOSPITAL_COMMUNITY): Payer: Self-pay | Admitting: Neurological Surgery

## 2018-07-02 DIAGNOSIS — R609 Edema, unspecified: Secondary | ICD-10-CM

## 2018-07-02 LAB — CBC WITH DIFFERENTIAL/PLATELET
ABS IMMATURE GRANULOCYTES: 0.11 10*3/uL — AB (ref 0.00–0.07)
Basophils Absolute: 0 10*3/uL (ref 0.0–0.1)
Basophils Relative: 0 %
Eosinophils Absolute: 0 10*3/uL (ref 0.0–0.5)
Eosinophils Relative: 0 %
HEMATOCRIT: 29 % — AB (ref 39.0–52.0)
Hemoglobin: 9.4 g/dL — ABNORMAL LOW (ref 13.0–17.0)
IMMATURE GRANULOCYTES: 1 %
LYMPHS ABS: 0.6 10*3/uL — AB (ref 0.7–4.0)
Lymphocytes Relative: 6 %
MCH: 30.5 pg (ref 26.0–34.0)
MCHC: 32.4 g/dL (ref 30.0–36.0)
MCV: 94.2 fL (ref 80.0–100.0)
MONOS PCT: 3 %
Monocytes Absolute: 0.3 10*3/uL (ref 0.1–1.0)
NEUTROS ABS: 9.5 10*3/uL — AB (ref 1.7–7.7)
Neutrophils Relative %: 90 %
PLATELETS: 76 10*3/uL — AB (ref 150–400)
RBC: 3.08 MIL/uL — ABNORMAL LOW (ref 4.22–5.81)
RDW: 16.6 % — ABNORMAL HIGH (ref 11.5–15.5)
WBC: 10.5 10*3/uL (ref 4.0–10.5)
nRBC: 0 % (ref 0.0–0.2)

## 2018-07-02 LAB — BPAM PLATELET PHERESIS
Blood Product Expiration Date: 201910202359
Blood Product Expiration Date: 201910212359
ISSUE DATE / TIME: 201910191516
ISSUE DATE / TIME: 201910192228
Unit Type and Rh: 5100
Unit Type and Rh: 6200

## 2018-07-02 LAB — APTT: aPTT: 24 seconds (ref 24–36)

## 2018-07-02 LAB — BASIC METABOLIC PANEL
ANION GAP: 10 (ref 5–15)
BUN: 31 mg/dL — ABNORMAL HIGH (ref 6–20)
CO2: 26 mmol/L (ref 22–32)
Calcium: 8.9 mg/dL (ref 8.9–10.3)
Chloride: 100 mmol/L (ref 98–111)
Creatinine, Ser: 0.8 mg/dL (ref 0.61–1.24)
GFR calc Af Amer: >60 mL/min (ref >60)
Glucose, Bld: 199 mg/dL — ABNORMAL HIGH (ref 70–99)
POTASSIUM: 4.5 mmol/L (ref 3.5–5.1)
Sodium: 136 mmol/L (ref 135–145)

## 2018-07-02 LAB — GLUCOSE, CAPILLARY
GLUCOSE-CAPILLARY: 217 mg/dL — AB (ref 70–99)
GLUCOSE-CAPILLARY: 233 mg/dL — AB (ref 70–99)
Glucose-Capillary: 244 mg/dL — ABNORMAL HIGH (ref 70–99)
Glucose-Capillary: 227 mg/dL — ABNORMAL HIGH (ref 70–99)

## 2018-07-02 LAB — PREPARE PLATELET PHERESIS
UNIT DIVISION: 0
Unit division: 0

## 2018-07-02 LAB — PREPARE RBC (CROSSMATCH)

## 2018-07-02 LAB — PROTIME-INR
INR: 1.22
Prothrombin Time: 15.3 seconds — ABNORMAL HIGH (ref 11.4–15.2)

## 2018-07-02 LAB — HEPARIN INDUCED PLATELET AB (HIT ANTIBODY): HEPARIN INDUCED PLT AB: 0.486 {OD_unit} — AB (ref 0.000–0.400)

## 2018-07-02 MED ORDER — SODIUM CHLORIDE 0.9% IV SOLUTION
Freq: Once | INTRAVENOUS | Status: AC
  Administered 2018-07-02: 01:00:00 via INTRAVENOUS

## 2018-07-02 NOTE — Anesthesia Postprocedure Evaluation (Signed)
Anesthesia Post Note  Patient: Chaden Karapetian  Procedure(s) Performed: EVACUATION OF POSTERIOR CERVICAL HEMATOMA (N/A Spine Cervical)     Patient location during evaluation: PACU Anesthesia Type: General Level of consciousness: awake Pain management: pain level controlled Vital Signs Assessment: post-procedure vital signs reviewed and stable Respiratory status: spontaneous breathing, nonlabored ventilation, respiratory function stable and patient connected to nasal cannula oxygen Cardiovascular status: blood pressure returned to baseline and stable Postop Assessment: no apparent nausea or vomiting Anesthetic complications: no Comments: Only moving RUE.     Last Vitals:  Vitals:   07/02/18 0114 07/02/18 0130  BP: 119/86 121/84  Pulse: 62 (!) 56  Resp: 18 16  Temp: 36.4 C 36.5 C  SpO2: 100%     Last Pain:  Vitals:   07/02/18 0130  TempSrc: Oral  PainSc:                  Shelton Silvas

## 2018-07-02 NOTE — Progress Notes (Signed)
1610: Pt arrived back to unit after surgery. Vitals stable. Pt drowsy but oriented and answering questions. New honeycomb has a small amount of drainage. Aspen collar in place. JP drain charged.   Pt still unable to move his LT side but has sensation.

## 2018-07-02 NOTE — Progress Notes (Signed)
Neurosurgery Service Progress Note  Subjective: No acute events overnight post-op, strength subjectively improving after surgery  Objective: Vitals:   07/02/18 0114 07/02/18 0130 07/02/18 0312 07/02/18 0730  BP: 119/86 121/84 124/76 116/71  Pulse: 62 (!) 56 71 73  Resp: 18 16 12 16   Temp: 97.6 F (36.4 C) 97.7 F (36.5 C) 97.6 F (36.4 C) 97.6 F (36.4 C)  TempSrc: Oral Oral Oral Oral  SpO2: 100%  100% 100%  Weight:      Height:       Temp (24hrs), Avg:98.1 F (36.7 C), Min:97.6 F (36.4 C), Max:98.6 F (37 C)  CBC Latest Ref Rng & Units 07/02/2018 07/01/2018 06/30/2018  WBC 4.0 - 10.5 K/uL 10.5 10.6(H) 11.6(H)  Hemoglobin 13.0 - 17.0 g/dL 6.0(A) 5.4(U) 9.8(J)  Hematocrit 39.0 - 52.0 % 29.0(L) 28.9(L) 28.7(L)  Platelets 150 - 400 K/uL 76(L) 55(L) 56(L)   BMP Latest Ref Rng & Units 07/02/2018 07/01/2018 06/25/2018  Glucose 70 - 99 mg/dL 191(Y) 782(N) 562(Z)  BUN 6 - 20 mg/dL 30(Q) 65(H) 84(O)  Creatinine 0.61 - 1.24 mg/dL 9.62 9.52 8.41  Sodium 135 - 145 mmol/L 136 137 134(L)  Potassium 3.5 - 5.1 mmol/L 4.5 4.4 4.7  Chloride 98 - 111 mmol/L 100 104 100  CO2 22 - 32 mmol/L 26 27 27   Calcium 8.9 - 10.3 mg/dL 8.9 3.2(G) 8.9    Intake/Output Summary (Last 24 hours) at 07/02/2018 0957 Last data filed at 07/02/2018 0730 Gross per 24 hour  Intake 3130 ml  Output 3830 ml  Net -700 ml    Current Facility-Administered Medications:  .  0.9 %  sodium chloride infusion (Manually program via Guardrails IV Fluids), , Intravenous, Once, Lynnwood Beckford A, MD .  0.9 %  sodium chloride infusion (Manually program via Guardrails IV Fluids), , Intravenous, Once, Mirtha Jain A, MD .  0.9 %  sodium chloride infusion, 10 mL/hr, Intravenous, Once, Giordan Fordham, Clovis Pu, MD .  acetaminophen (TYLENOL) tablet 650 mg, 650 mg, Oral, Q4H PRN, 650 mg at 06/26/18 2204 **OR** acetaminophen (TYLENOL) suppository 650 mg, 650 mg, Rectal, Q4H PRN, Jadene Pierini, MD .  ALPRAZolam Prudy Feeler)  tablet 1 mg, 1 mg, Oral, BID PRN, Jadene Pierini, MD, 1 mg at 07/02/18 0742 .  alum & mag hydroxide-simeth (MAALOX/MYLANTA) 200-200-20 MG/5ML suspension 30 mL, 30 mL, Oral, Q6H PRN, Jadene Pierini, MD, 30 mL at 06/26/18 2203 .  benzonatate (TESSALON) capsule 100 mg, 100 mg, Oral, TID PRN, Jadene Pierini, MD, 100 mg at 07/02/18 0311 .  bisacodyl (DULCOLAX) EC tablet 5 mg, 5 mg, Oral, Daily PRN, Jadene Pierini, MD .  cyclobenzaprine (FLEXERIL) tablet 10 mg, 10 mg, Oral, TID PRN, Jadene Pierini, MD, 10 mg at 07/02/18 0311 .  dexamethasone (DECADRON) injection 8 mg, 8 mg, Intravenous, Q6H, Christain Mcraney, Clovis Pu, MD, 8 mg at 07/02/18 4010 .  docusate sodium (COLACE) capsule 100 mg, 100 mg, Oral, Daily, Jadene Pierini, MD, 100 mg at 06/30/18 0931 .  doxycycline (VIBRA-TABS) tablet 100 mg, 100 mg, Oral, Q12H, Jadene Pierini, MD, 100 mg at 07/01/18 0837 .  febuxostat (ULORIC) tablet 40 mg, 40 mg, Oral, Daily, Jadene Pierini, MD, 40 mg at 07/01/18 0849 .  fluconazole (DIFLUCAN) tablet 200 mg, 200 mg, Oral, Daily, Jadene Pierini, MD, 200 mg at 07/01/18 0849 .  gabapentin (NEURONTIN) capsule 300 mg, 300 mg, Oral, TID, Jadene Pierini, MD, 300 mg at 07/01/18 1622 .  HYDROcodone-homatropine (HYCODAN) 5-1.5 MG/5ML syrup  5 mL, 5 mL, Oral, Q4H PRN, Jadene Pierini, MD, 5 mL at 07/02/18 0952 .  HYDROmorphone (DILAUDID) injection 1 mg, 1 mg, Intravenous, Q2H PRN, Jadene Pierini, MD, 1 mg at 07/02/18 [phone] .  insulin aspart (novoLOG) injection 0-5 Units, 0-5 Units, Subcutaneous, QHS, Neeti Knudtson A, MD .  insulin aspart (novoLOG) injection 0-9 Units, 0-9 Units, Subcutaneous, TID WC, Tala Eber, Clovis Pu, MD, 2 Units at 06/30/18 1735 .  lactated ringers infusion, , Intravenous, Continuous, Lekisha Mcghee, Clovis Pu, MD, Last Rate: 10 mL/hr at 07/02/18 0320 .  menthol-cetylpyridinium (CEPACOL) lozenge 3 mg, 1 lozenge, Oral, PRN **OR** phenol (CHLORASEPTIC) mouth spray  1 spray, 1 spray, Mouth/Throat, PRN, Jadene Pierini, MD, 1 spray at 06/30/18 0132 .  methocarbamol (ROBAXIN) tablet 750 mg, 750 mg, Oral, QID, Valena Ivanov, Clovis Pu, MD, 750 mg at 07/01/18 1722 .  midodrine (PROAMATINE) tablet 5 mg, 5 mg, Oral, TID WC, Jazon Jipson, Clovis Pu, MD, 5 mg at 07/01/18 1622 .  morphine 2 MG/ML injection 2 mg, 2 mg, Intravenous, Q4H PRN, Jadene Pierini, MD, 2 mg at 06/30/18 0142 .  nystatin (MYCOSTATIN) 100000 UNIT/ML suspension 500,000 Units, 5 mL, Oral, TID AC & HS, Jadene Pierini, MD, 500,000 Units at 07/01/18 1622 .  ondansetron (ZOFRAN) tablet 4 mg, 4 mg, Oral, Q6H PRN **OR** ondansetron (ZOFRAN) injection 4 mg, 4 mg, Intravenous, Q6H PRN, Fredrika Canby A, MD .  oxyCODONE (Oxy IR/ROXICODONE) immediate release tablet 15 mg, 15 mg, Oral, Q3H PRN, Jadene Pierini, MD, 15 mg at 07/01/18 0327 .  oxyCODONE (Oxy IR/ROXICODONE) immediate release tablet 20 mg, 20 mg, Oral, Q3H PRN, Jadene Pierini, MD, 20 mg at 07/01/18 0849 .  pantoprazole (PROTONIX) EC tablet 40 mg, 40 mg, Oral, Daily, Jadene Pierini, MD, 40 mg at 07/01/18 0849 .  polyethylene glycol (MIRALAX / GLYCOLAX) packet 17 g, 17 g, Oral, BID, Jadene Pierini, MD, 17 g at 06/30/18 0931 .  senna-docusate (Senokot-S) tablet 1 tablet, 1 tablet, Oral, QHS, Jadene Pierini, MD, 1 tablet at 06/29/18 2202 .  sodium chloride flush (NS) 0.9 % injection 10-40 mL, 10-40 mL, Intracatheter, PRN, Branton Einstein A, MD .  sodium chloride flush (NS) 0.9 % injection 3 mL, 3 mL, Intravenous, PRN, Jadene Pierini, MD, 3 mL at 06/29/18 1015   Physical Exam: AOx3, PERRL, gaze neutral, FS Strength 4-/5 in RUE, 4-/5 in RLE, 3/5 in LUE, 0/5 in LLE Sensation decreased on left side diffusely but able to feel light touch Incision c/d/i, drain w/ serosang output  Assessment & Plan: 52 y.o. man with myelopathy and recurrent post-operative hematomas s/p evacuation, unclear hematologic issue with  thrombocytopenia, DVTs, PE, coagulopathy. 10/20 worsening motor function, stat MRI C-spine w/ large epidural hematoma s/p OR for clot evacuation. -exam improving post-op -RUE arm significantly colder and more edematous than left, PICC was in place yesterday and stopped working, now a midline. No chest pain/pressure, back pain, or tearing sensation, arterial pulses are symmetric in radials bilaterally. Given hx, concerning for RUE DVT, ordered stat duplex RUE -s/p 2U PLT, 1U pRBC, DDAVP for OR, PLTs did not increment appropriately, Hb appropriate, Na stable -f/u rpt INR  Jadene Pierini  07/02/18 9:57 AM

## 2018-07-02 NOTE — Progress Notes (Signed)
Patient with flat affect today, not interactive with staff.  Friends in room throughout day providing emotional support along with staff.

## 2018-07-02 NOTE — Progress Notes (Signed)
PROGRESS NOTE    [name] [name]  ZOX:[id number] DOB: September 22, 1965 DOA: 06/06/2018 PCP: Devra Dopp, MD   Brief Narrative:  52 year old man with past medical history relevant for gout, hypertension anterior cervical corpectomy of C4 on 03/24/2018 with comp gated postoperative course including development of multiple PEs and DVTs started on anticoagulation and subsequently developed hematoma requiring reexploration on 03/28/2018 status post placement of IVC filter who was discharged to skilled nursing facility and had a complicated course including progressive lower extremity weakness, intermittent fevers of unclear etiology and hospitalizations for "pneumonia".  Patient was admitted to this hospital is a transfer from [facility] with MRI of C-spine that showed postoperative changes and cord compression with cord edema and concern for ischemia.  Patient is status post posterior cervical decompression laminectomy of C3, C4, C5, removal of spinous processes, foraminotomies on 06/14/2018.  On 06/22/2018 patient began to develop worsening left sided paresthesias and weakness and a stat MRI showed evidence of postoperative hematoma.  Patient was taken to the OR with evacuation of hematoma.  Additionally his postoperative course has been complicated by recurrent fevers, bilateral lower extremity DVTs on ultrasound, migratory arthralgias/arthritis that are thought to be rheumatologic in nature as well as significant orthostasis limiting the amount he can work with physical therapy.  Noted to have decreased left hand grip on 10/19, MRI showed large epidural hematoma, patient s/p clot evacuation.   Assessment & Plan   Near syncope due to orthostatic hypotension -Likely combination of hypovolemia as well as autonomic dysfunction -Patient was given IV fluids and placed on TED hose -He eventually was started on midodrine -Echocardiogram July 2019 showed a normal systolic function, grade 2 diastolic  dysfunction.  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 -Neurosurgery consulted and appreciated -Patient was noted to have left-sided weakness and numbness -Status post cervical decompressive surgery on 06/14/2018 -Status post evacuation of large hematoma and placement of Hemovac drain on 06/22/2018 -Continue IV dexamethasone per neurosurgery -Pending SNF vs CIR (not sure if patient could withstand CIR, however, feel he needs more monitoring than can be provided at [facility]; will speak with case management regarding LTAC?) -patient with decreased left sided hand grip and weakness on 10/19; MRI C-spine showed large epidural hematoma -S/p OR for clot evacuation on 10/19  Thrombocytopenia/coagulopathy -Neurosurgery concerned about coagulopathy due to recurrent hematomas -Hematology oncology consulted and appreciated, recommended argatroban (currently holding off on starting) -Cardiolipin antibody negative -Lupus anticoagulant, homocysteine level, factor VIII within normal limits -Protein S activity 60 (low) -Protein C total >200; Activity 199 (high)  -unfortunately, drain was pulled on 10/17, patient experienced bleeding on 10/18 -HIT panel pending  -immature platelet fraction WNL -platelets 76  -Was given 2u platelets on 10/19 in the OR   Polyarthralgia/persistent fever -Patient had symptoms in the shoulders and ankles, along with a history of gout and pain in his knees.  He underwent arthrocentesis however no evidence of infection was found. -He was noted to have elevated inflammatory markers including CCP, CRP.  However ANCA, aldolase, ANA were negative. -Previous rounding physician discussed with Dr. Deanne Coffer, rheumatologist, he will follow-up with the patient as an outpatient. -Patient was placed on prednisone however currently on dexamethasone (neurosurgery) -No longer having fevers  Cough -mild worsening -continue  tessalon -will order hycodan q4h PRN  Mild acute renal failure -resolved  Recent PE /DVT in setting of spine surgery -Status post IVC filter placement on March 28, 2018 -Patient was on apixaban however changed  over to IV heparin.  Anticoagulation however was discontinued for surgery.  Patient also noted to have thrombocytopenia. -Vascular DVT US 06/26/2018 showed bilateral lower extremity DVTs likely acute -Hematology consulted and appreciated, recommended Argatroban.  However neurosurgery is wanting to hold any type of anticoagulation for an additional 3 days -Discussed with vascular surgery, Dr. Myra Gianotti, patient is out of the 3 week window for treatment, he would have to get TPA, which is contraindicated in a bleeding patient.  -LE with no edema today -Patient had PICC which stopped working, now has a midline -Neurosurgery ordering RUE doppler given edema in RUE   Essential hypertension -Patient was on amlodipine however was noted to have lower extremity edema and amlodipine was discontinued.  He was also noted to have orthostatic hypotension as noted above.  Gout -Does not seem to be active at this time, continue Uloric  Normocytic anemia -Currently hemoglobin 9.4 -was given 1u PRBC on 10/19 in OR -continue to monitor CBC  DVT Prophylaxis  SCDs  Code Status: Full  Family Communication: None at bedside.  Disposition Plan: Admitted. Dispo pending. Patient currently not medically stable for discharge. Given complexity of patient, will change him to stepdown   Consultants Neurosurgery Orthopedic surgery Infectious disease  Inpatient rehab Vascular surgery , Dr. Myra Gianotti, via phone  Procedures  On 10/2 1. posterior cervical decompressive laminectomy at C3, C4, C5 with removal of the spinous process and complete laminectomy and foraminotomies of the C3, C4, C5 nerve roots. 2. Posterior cervical fusion with lateral mass screws at C3-C4-C5 and pars screws at C2 lies in the  globus ellipseLateral mass screw system 3. Posterior lateral arthrodesis C2-C5 utilizing locally harvested autograft mixed with vivigen  06/22/2018: Procedure reexploration posterior cervical wound for evacuation of epidural hematoma  06/26/2018 DVT ultrasound:Right: Findings consistent with acute deep vein thrombosis involving the right common femoral vein, right femoral vein, right popliteal vein, right posterior tibial vein, and right peroneal vein. Thrombosis extends proximally into external iliac vein.  Proximal iliac vein not visualized. Left: Findings consistent with acute deep vein thrombosis involving the left common femoral vein, left femoral vein, left popliteal vein, left posterior tibial vein, and left peroneal vein. Findings consistent with acute superficial vein thrombosis  involving the left great saphenous vein. Thrombosis extends proximally into external iliac vein. Proximal iliac vein not visualized.  07/01/18 Clot evacuation   Antibiotics   Anti-infectives (From admission, onward)   Start     Dose/Rate Route Frequency Ordered Stop   07/01/18 2240  bacitracin 50,000 Units in sodium chloride 0.9 % 500 mL irrigation  Status:  Discontinued       As needed 07/01/18 2240 07/01/18 2324   06/30/18 1330  doxycycline (VIBRA-TABS) tablet 100 mg     100 mg Oral Every 12 hours 06/30/18 1329     06/27/18 1000  fluconazole (DIFLUCAN) tablet 200 mg     200 mg Oral Daily 06/27/18 0725     06/22/18 2315  ceFAZolin (ANCEF) IVPB 2g/100 mL premix     2 g 200 mL/hr over 30 Minutes Intravenous Every 8 hours 06/22/18 2314 06/27/18 1519   06/22/18 1828  bacitracin 50,000 Units in sodium chloride 0.9 % 500 mL irrigation  Status:  Discontinued       As needed 06/22/18 1828 06/22/18 1915   06/22/18 1700  ceFAZolin (ANCEF) 3 g in dextrose 5 % 50 mL IVPB  Status:  Discontinued     3 g 100 mL/hr over 30 Minutes Intravenous  Once 06/22/18  1647 06/22/18 2101   06/14/18 2100  ceFAZolin (ANCEF)  IVPB 2g/100 mL premix     2 g 200 mL/hr over 30 Minutes Intravenous Every 8 hours 06/14/18 1712 06/17/18 0800   06/14/18 1414  bacitracin 50,000 Units in sodium chloride 0.9 % 500 mL irrigation  Status:  Discontinued       As needed 06/14/18 1414 06/14/18 1647   06/14/18 1215  ceFAZolin (ANCEF) 3 g in dextrose 5 % 50 mL IVPB     3 g 160 mL/hr over 30 Minutes Intravenous To [facility] 06/14/18 1202 06/14/18 2225   06/14/18 1200  ceFAZolin (ANCEF) IVPB 2g/100 mL premix  Status:  Discontinued     2 g 200 mL/hr over 30 Minutes Intravenous On call to O.R. 06/14/18 1157 06/14/18 2140      Subjective:   [name] [name] seen and examined today.  Does not want to bet bothered today. States he had a horrible day yesterday and is very tired today. Unable to move his left leg today and barely able to move his left arm today. Also complaining of numbness in the left side. Denies chest pain, shortness of breath, abdominal pain, N/V.  Objective:   Vitals:   07/02/18 0114 07/02/18 0130 07/02/18 0312 07/02/18 0730  BP: 119/86 121/84 124/76 116/71  Pulse: 62 (!) 56 71 73  Resp: 18 16 12 16   Temp: 97.6 F (36.4 C) 97.7 F (36.5 C) 97.6 F (36.4 C) 97.6 F (36.4 C)  TempSrc: Oral Oral Oral Oral  SpO2: 100%  100% 100%  Weight:      Height:        Intake/Output Summary (Last 24 hours) at 07/02/2018 1024 Last data filed at 07/02/2018 0730 Gross per 24 hour  Intake 3130 ml  Output 3830 ml  Net -700 ml   Filed Weights   06/06/18 2246 06/14/18 1151 06/22/18 1641  Weight: 123.8 kg 123.8 kg 123.8 kg   Exam  General: Well developed, chronically ill appearing, NAD  HEENT: NCAT, mucous membranes moist.   Neck: Collar in place  Cardiovascular: S1 S2 auscultated, RRR, no murmur  Respiratory: Clear to auscultation bilaterally   Abdomen: Soft, obese, nontender, nondistended, + bowel sounds  Extremities: warm dry without cyanosis clubbing or edema, ted hose in place  Neuro: AAOx3,  nonfocal. Weakness in RUE and RLE (4/5), LUE 2-3/5, LLE unable to move (0/5). Sensation is decreased on the left.   Psych: Flat and depressed   Data Reviewed: I have personally reviewed following labs and imaging studies  CBC: Recent Labs  Lab 06/28/18 0500 06/29/18 0646 06/30/18 0500 07/01/18 0606 07/02/18 0612  WBC 13.5* 12.8* 11.6* 10.6* 10.5  NEUTROABS 11.6* 10.8* 10.0* 8.9* 9.5*  HGB 9.1* 9.3* 8.7* 8.7* 9.4*  HCT 30.9* 31.3* 28.7* 28.9* 29.0*  MCV 96.3 96.3 96.6 95.4 94.2  PLT 65* 65* 56* 55* 76*   Basic Metabolic Panel: Recent Labs  Lab 07/01/18 0606 07/02/18 0612  NA 137 136  K 4.4 4.5  CL 104 100  CO2 27 26  GLUCOSE 168* 199*  BUN 30* 31*  CREATININE 0.89 0.80  CALCIUM 8.8* 8.9   GFR: Estimated Creatinine Clearance: 140.4 mL/min (by C-G formula based on SCr of 0.8 mg/dL). Liver Function Tests: No results for input(s): AST, ALT, ALKPHOS, BILITOT, PROT, ALBUMIN in the last 168 hours. No results for input(s): LIPASE, AMYLASE in the last 168 hours. No results for input(s): AMMONIA in the last 168 hours. Coagulation Profile: Recent Labs  Lab  06/27/18 0630 06/30/18 1059 07/01/18 0854  INR 1.23 1.33 1.20   Cardiac Enzymes: No results for input(s): CKTOTAL, CKMB, CKMBINDEX, TROPONINI in the last 168 hours. BNP (last 3 results) No results for input(s): PROBNP in the last 8760 hours. HbA1C: No results for input(s): HGBA1C in the last 72 hours. CBG: Recent Labs  Lab 07/01/18 0756 07/01/18 1240 07/01/18 1711 07/01/18 2054 07/02/18 0840  GLUCAP 144* 117* 140* 134* 244*   Lipid Profile: No results for input(s): CHOL, HDL, LDLCALC, TRIG, CHOLHDL, LDLDIRECT in the last 72 hours. Thyroid Function Tests: No results for input(s): TSH, T4TOTAL, FREET4, T3FREE, THYROIDAB in the last 72 hours. Anemia Panel: No results for input(s): VITAMINB12, FOLATE, FERRITIN, TIBC, IRON, RETICCTPCT in the last 72 hours. Urine analysis:    Component Value Date/Time    COLORURINE AMBER (A) 06/07/2018 0900   APPEARANCEUR CLOUDY (A) 06/07/2018 0900   LABSPEC 1.028 06/07/2018 0900   PHURINE 5.0 06/07/2018 0900   GLUCOSEU NEGATIVE 06/07/2018 0900   HGBUR SMALL (A) 06/07/2018 0900   BILIRUBINUR NEGATIVE 06/07/2018 0900   KETONESUR 5 (A) 06/07/2018 0900   PROTEINUR 30 (A) 06/07/2018 0900   NITRITE NEGATIVE 06/07/2018 0900   LEUKOCYTESUR NEGATIVE 06/07/2018 0900   Sepsis Labs: @LABRCNTIP (procalcitonin:4,lacticidven:4)  )No results found for this or any previous visit (from the past 240 hour(s)).    Radiology Studies: Mr Cervical Spine Wo Contrast  Result Date: 07/01/2018 CLINICAL DATA:  Cord compression and edema. EXAM: MRI CERVICAL SPINE WITHOUT CONTRAST TECHNIQUE: Multiplanar, multisequence MR imaging of the cervical spine was performed. No intravenous contrast was administered. COMPARISON:  06/22/2018 FINDINGS: Alignment: Normal Vertebrae: Status post C3-C5 ACDF with C4 corpectomy. Decompression and instrumentation from C2-C5. No acute fracture or discitis. Cord: Compression with edema throughout the operative region, severe with thecal sac narrowing to 2 -3 mm. Posterior Fossa, vertebral arteries, paraspinal tissues: There is a large tense appearing fluid collection with hematocrit levels within the laminectomy defects, deep fascial space, and extending to the skin surface. Edema and intrinsic neck muscles, likely postoperative. Disc levels: C2-3: Negative C3-4: Residual left eccentric ossicles shin of the posterior longitudinal ligament. Left foraminal impingement. 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 Mild left foraminal narrowing C5-6: Disc narrowing and bulging. Mild uncovertebral spurring. Spinal stenosis from fluid collection exerting mass effect on the dorsal thecal sac. C6-7: Unremarkable. C7-T1:Unremarkable. Results were called by telephone at the time of interpretation on 07/01/2018 at 8:28  pm to Dr. Maurice Small, who is already aware. IMPRESSION: 1. Severe thecal sac effacement with cord edema from C2-3 to C5-6 primarily related to tense posterior fluid collection with hematocrit levels. 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 2. C3-C5 anterior fusion with C4 corpectomy. Electronically Signed   By: Marnee Spring M.D.   On: 07/01/2018 20:29   [place] Ekg Site Rite  Result Date: 07/01/2018 If Site Rite image not attached, placement could not be confirmed due to current cardiac rhythm.  [place] Ekg Site Rite  Result Date: 07/01/2018 If Site Rite image not attached, placement could not be confirmed due to current cardiac rhythm.    Scheduled Meds: . sodium chloride   Intravenous Once  . sodium chloride   Intravenous Once  . dexamethasone  8 mg Intravenous Q6H  . docusate sodium  100 mg Oral Daily  . doxycycline  100 mg Oral Q12H  . febuxostat  40 mg Oral Daily  . fluconazole  200  mg Oral Daily  . gabapentin  300 mg Oral TID  . insulin aspart  0-5 Units Subcutaneous QHS  . insulin aspart  0-9 Units Subcutaneous TID WC  . methocarbamol  750 mg Oral QID  . midodrine  5 mg Oral TID WC  . nystatin  5 mL Oral TID AC & HS  . pantoprazole  40 mg Oral Daily  . polyethylene glycol  17 g Oral BID  . senna-docusate  1 tablet Oral QHS   Continuous Infusions: . sodium chloride    . lactated ringers 10 mL/hr at 07/02/18 0320     LOS: 25 days   Time Spent in minutes   45 minutes (greater than 50% of time spent with patient face to face, as well as reviewing records, calling consults, and formulating a plan)  Edsel Petrin D.O. on 07/02/2018 at 10:24 AM  Between 7am to 7pm - Please see pager noted on [email or web address]  After 7pm go to [email or web address]  And look for the night coverage person covering for me after hours  [facility] Office  [phone]

## 2018-07-02 NOTE — Progress Notes (Signed)
RUE venous duplex prelim: no evidence of DVT or superficial thrombosis. Farrel Demark, RDMS, RVT

## 2018-07-03 LAB — BASIC METABOLIC PANEL
Anion gap: 10 (ref 5–15)
BUN: 35 mg/dL — ABNORMAL HIGH (ref 6–20)
CHLORIDE: 104 mmol/L (ref 98–111)
CO2: 24 mmol/L (ref 22–32)
Calcium: 8.8 mg/dL — ABNORMAL LOW (ref 8.9–10.3)
Creatinine, Ser: 1.3 mg/dL — ABNORMAL HIGH (ref 0.61–1.24)
GFR calc non Af Amer: >60 mL/min (ref >60)
Glucose, Bld: 236 mg/dL — ABNORMAL HIGH (ref 70–99)
POTASSIUM: 4.8 mmol/L (ref 3.5–5.1)
SODIUM: 138 mmol/L (ref 135–145)

## 2018-07-03 LAB — CBC
HCT: 29.5 % — ABNORMAL LOW (ref 39.0–52.0)
HEMOGLOBIN: 9.4 g/dL — AB (ref 13.0–17.0)
MCH: 30.9 pg (ref 26.0–34.0)
MCHC: 31.9 g/dL (ref 30.0–36.0)
MCV: 97 fL (ref 80.0–100.0)
Platelets: 75 10*3/uL — ABNORMAL LOW (ref 150–400)
RBC: 3.04 MIL/uL — AB (ref 4.22–5.81)
RDW: 16.9 % — ABNORMAL HIGH (ref 11.5–15.5)
WBC: 11.5 10*3/uL — AB (ref 4.0–10.5)
nRBC: 0 % (ref 0.0–0.2)

## 2018-07-03 LAB — GLUCOSE, CAPILLARY
GLUCOSE-CAPILLARY: 265 mg/dL — AB (ref 70–99)
GLUCOSE-CAPILLARY: 247 mg/dL — AB (ref 70–99)
GLUCOSE-CAPILLARY: 240 mg/dL — AB (ref 70–99)
Glucose-Capillary: 232 mg/dL — ABNORMAL HIGH (ref 70–99)

## 2018-07-03 LAB — FACTOR 5 LEIDEN

## 2018-07-03 LAB — APTT: APTT: 25 s (ref 24–36)

## 2018-07-03 LAB — PROTHROMBIN GENE MUTATION

## 2018-07-03 MED FILL — Thrombin (Recombinant) For Soln 5000 Unit: CUTANEOUS | Qty: 5000 | Status: AC

## 2018-07-03 NOTE — Progress Notes (Signed)
PROGRESS NOTE    [name] [name]  ZOX:[id number] DOB: Nov 16, 1965 DOA: 06/06/2018 PCP: Devra Dopp, MD   Brief Narrative:  52 year old man with past medical history relevant for gout, hypertension anterior cervical corpectomy of C4 on 03/24/2018 with comp gated postoperative course including development of multiple PEs and DVTs started on anticoagulation and subsequently developed hematoma requiring reexploration on 03/28/2018 status post placement of IVC filter who was discharged to skilled nursing facility and had a complicated course including progressive lower extremity weakness, intermittent fevers of unclear etiology and hospitalizations for "pneumonia".  Patient was admitted to this hospital is a transfer from [facility] with MRI of C-spine that showed postoperative changes and cord compression with cord edema and concern for ischemia.  Patient is status post posterior cervical decompression laminectomy of C3, C4, C5, removal of spinous processes, foraminotomies on 06/14/2018.  On 06/22/2018 patient began to develop worsening left sided paresthesias and weakness and a stat MRI showed evidence of postoperative hematoma.  Patient was taken to the OR with evacuation of hematoma.  Additionally his postoperative course has been complicated by recurrent fevers, bilateral lower extremity DVTs on ultrasound, migratory arthralgias/arthritis that are thought to be rheumatologic in nature as well as significant orthostasis limiting the amount he can work with physical therapy.  Noted to have decreased left hand grip on 10/19, MRI showed large epidural hematoma, patient s/p clot evacuation.   Assessment & Plan   Near syncope due to orthostatic hypotension -Likely combination of hypovolemia as well as autonomic dysfunction -Patient was given IV fluids and placed on TED hose -He eventually was started on midodrine -Echocardiogram July 2019 showed a normal systolic function, grade 2 diastolic  dysfunction.  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 -Neurosurgery consulted and appreciated -Patient was noted to have left-sided weakness and numbness -Status post cervical decompressive surgery on 06/14/2018 -Status post evacuation of large hematoma and placement of Hemovac drain on 06/22/2018 -Continue IV dexamethasone per neurosurgery -Pending SNF vs CIR (not sure if patient could withstand CIR, however, feel he needs more monitoring than can be provided at [facility]; will speak with case management regarding LTAC?) -patient with decreased left sided hand grip and weakness on 10/19; MRI C-spine showed large epidural hematoma -S/p OR for clot evacuation on 10/19 -Regaining function of the left upper/lower extremity  Thrombocytopenia/coagulopathy -Neurosurgery concerned about coagulopathy due to recurrent hematomas -Hematology oncology consulted and appreciated, recommended argatroban (currently holding off on starting) -Cardiolipin antibody negative -Lupus anticoagulant, homocysteine level, factor VIII within normal limits -Protein S activity 60 (low) -Protein C total >200; Activity 199 (high)  -unfortunately, drain was pulled on 10/17, patient experienced bleeding on 10/18 -HIT panel pending  -immature platelet fraction WNL -Was given 2u platelets on 10/19 in the OR  -platelets 75  Polyarthralgia/persistent fever -Patient had symptoms in the shoulders and ankles, along with a history of gout and pain in his knees.  He underwent arthrocentesis however no evidence of infection was found. -He was noted to have elevated inflammatory markers including CCP, CRP.  However ANCA, aldolase, ANA were negative. -Previous rounding physician discussed with Dr. Deanne Coffer, rheumatologist, he will follow-up with the patient as an outpatient. -Patient was placed on prednisone however currently on dexamethasone (neurosurgery) -No longer having  fevers  Cough -per patient, improving  -continue tessalon, hycodan PRN  Mild acute renal failure -resolved  Recent PE /DVT in setting of spine surgery -Status post IVC filter placement on March 28, 2018 -Patient  was on apixaban however changed over to IV heparin.  Anticoagulation however was discontinued for surgery.  Patient also noted to have thrombocytopenia. -Vascular DVT US 06/26/2018 showed bilateral lower extremity DVTs likely acute -Hematology consulted and appreciated, recommended Argatroban.  However neurosurgery is wanting to hold any type of anticoagulation for an additional 3 days -Discussed with vascular surgery, Dr. Myra Gianotti, patient is out of the 3 week window for treatment, he would have to get TPA, which is contraindicated in a bleeding patient.  -LE with no edema today -Patient had PICC which stopped working, now has a midline -Neurosurgery ordering RUE doppler given edema in RUE - negative for DVT  Essential hypertension -Patient was on amlodipine however was noted to have lower extremity edema and amlodipine was discontinued.  He was also noted to have orthostatic hypotension as noted above.  Gout -Does not seem to be active at this time, continue Uloric  Normocytic anemia -Currently hemoglobin 9.4 -was given 1u PRBC on 10/19 in OR -continue to monitor CBC  DVT Prophylaxis  SCDs  Code Status: Full  Family Communication: None at bedside.  Disposition Plan: Admitted. Dispo pending. Patient currently not medically stable for discharge. Given complexity of patient, continue to monitor in stepdown  Consultants Neurosurgery Orthopedic surgery Infectious disease  Inpatient rehab Vascular surgery , Dr. Myra Gianotti, via phone  Procedures  On 10/2 1. posterior cervical decompressive laminectomy at C3, C4, C5 with removal of the spinous process and complete laminectomy and foraminotomies of the C3, C4, C5 nerve roots. 2. Posterior cervical fusion with lateral mass  screws at C3-C4-C5 and pars screws at C2 lies in the globus ellipseLateral mass screw system 3. Posterior lateral arthrodesis C2-C5 utilizing locally harvested autograft mixed with vivigen  06/22/2018: Procedure reexploration posterior cervical wound for evacuation of epidural hematoma  06/26/2018 DVT ultrasound:Right: Findings consistent with acute deep vein thrombosis involving the right common femoral vein, right femoral vein, right popliteal vein, right posterior tibial vein, and right peroneal vein. Thrombosis extends proximally into external iliac vein.  Proximal iliac vein not visualized. Left: Findings consistent with acute deep vein thrombosis involving the left common femoral vein, left femoral vein, left popliteal vein, left posterior tibial vein, and left peroneal vein. Findings consistent with acute superficial vein thrombosis  involving the left great saphenous vein. Thrombosis extends proximally into external iliac vein. Proximal iliac vein not visualized.  07/01/18 Clot evacuation   RUE doppler 07/02/18  Antibiotics   Anti-infectives (From admission, onward)   Start     Dose/Rate Route Frequency Ordered Stop   07/01/18 2240  bacitracin 50,000 Units in sodium chloride 0.9 % 500 mL irrigation  Status:  Discontinued       As needed 07/01/18 2240 07/01/18 2324   06/30/18 1330  doxycycline (VIBRA-TABS) tablet 100 mg     100 mg Oral Every 12 hours 06/30/18 1329     06/27/18 1000  fluconazole (DIFLUCAN) tablet 200 mg     200 mg Oral Daily 06/27/18 0725     06/22/18 2315  ceFAZolin (ANCEF) IVPB 2g/100 mL premix     2 g 200 mL/hr over 30 Minutes Intravenous Every 8 hours 06/22/18 2314 06/27/18 1519   06/22/18 1828  bacitracin 50,000 Units in sodium chloride 0.9 % 500 mL irrigation  Status:  Discontinued       As needed 06/22/18 1828 06/22/18 1915   06/22/18 1700  ceFAZolin (ANCEF) 3 g in dextrose 5 % 50 mL IVPB  Status:  Discontinued  3 g 100 mL/hr over 30 Minutes  Intravenous  Once 06/22/18 1647 06/22/18 2101   06/14/18 2100  ceFAZolin (ANCEF) IVPB 2g/100 mL premix     2 g 200 mL/hr over 30 Minutes Intravenous Every 8 hours 06/14/18 1712 06/17/18 0800   06/14/18 1414  bacitracin 50,000 Units in sodium chloride 0.9 % 500 mL irrigation  Status:  Discontinued       As needed 06/14/18 1414 06/14/18 1647   06/14/18 1215  ceFAZolin (ANCEF) 3 g in dextrose 5 % 50 mL IVPB     3 g 160 mL/hr over 30 Minutes Intravenous To [facility] 06/14/18 1202 06/14/18 2225   06/14/18 1200  ceFAZolin (ANCEF) IVPB 2g/100 mL premix  Status:  Discontinued     2 g 200 mL/hr over 30 Minutes Intravenous On call to O.R. 06/14/18 1157 06/14/18 2140      Subjective:   Valor Kovacic seen and examined today.  Feeling better this morning.  Able to wiggle his left toes, lift his left arm and has improved grip strength with the left hand.  Patient feeling he was able to sleep well overnight.  Denies current chest pain, shortness breath, abdominal pain, nausea or vomiting, diarrhea or constipation. Objective:   Vitals:   07/02/18 2300 07/03/18 0300 07/03/18 0700 07/03/18 0800  BP: 129/81 131/84  (!) 141/91  Pulse: 71 83  79  Resp: 13 15  18   Temp: 98.1 F (36.7 C) 98.2 F (36.8 C) 98.4 F (36.9 C)   TempSrc: Oral Oral    SpO2: 97% 97%  98%  Weight:      Height:        Intake/Output Summary (Last 24 hours) at 07/03/2018 0939 Last data filed at 07/03/2018 0700 Gross per 24 hour  Intake 840 ml  Output 1300 ml  Net -460 ml   Filed Weights   06/06/18 2246 06/14/18 1151 06/22/18 1641  Weight: 123.8 kg 123.8 kg 123.8 kg   Exam  General: Well developed, chronically ill appearing, NAD  HEENT: NCAT, mucous membranes moist.   Neck: in collar  Cardiovascular: S1 S2 auscultated, RRR, no murmur  Respiratory: Clear to auscultation bilaterally with equal chest rise  Abdomen: Soft, obese, nontender, nondistended, + bowel sounds  Extremities: warm dry without  cyanosis clubbing or edema. TED hose in place  Neuro: AAOx3, nonfocal; weakness on the left improving-able to move left toes, and left hand/upper extremity able to move more  Psych: Appropriate mood and affect, pleasant   Data Reviewed: I have personally reviewed following labs and imaging studies  CBC: Recent Labs  Lab 06/28/18 0500 06/29/18 0646 06/30/18 0500 07/01/18 0606 07/02/18 0612 07/03/18 0717  WBC 13.5* 12.8* 11.6* 10.6* 10.5 11.5*  NEUTROABS 11.6* 10.8* 10.0* 8.9* 9.5*  --   HGB 9.1* 9.3* 8.7* 8.7* 9.4* 9.4*  HCT 30.9* 31.3* 28.7* 28.9* 29.0* 29.5*  MCV 96.3 96.3 96.6 95.4 94.2 97.0  PLT 65* 65* 56* 55* 76* 75*   Basic Metabolic Panel: Recent Labs  Lab 07/01/18 0606 07/02/18 0612 07/03/18 0717  NA 137 136 138  K 4.4 4.5 4.8  CL 104 100 104  CO2 27 26 24   GLUCOSE 168* 199* 236*  BUN 30* 31* 35*  CREATININE 0.89 0.80 1.30*  CALCIUM 8.8* 8.9 8.8*   GFR: Estimated Creatinine Clearance: 86.4 mL/min (A) (by C-G formula based on SCr of 1.3 mg/dL (H)). Liver Function Tests: No results for input(s): AST, ALT, ALKPHOS, BILITOT, PROT, ALBUMIN in the last 168 hours.  No results for input(s): LIPASE, AMYLASE in the last 168 hours. No results for input(s): AMMONIA in the last 168 hours. Coagulation Profile: Recent Labs  Lab 06/27/18 0630 06/30/18 1059 07/01/18 0854 07/02/18 1027  INR 1.23 1.33 1.20 1.22   Cardiac Enzymes: No results for input(s): CKTOTAL, CKMB, CKMBINDEX, TROPONINI in the last 168 hours. BNP (last 3 results) No results for input(s): PROBNP in the last 8760 hours. HbA1C: No results for input(s): HGBA1C in the last 72 hours. CBG: Recent Labs  Lab 07/02/18 0840 07/02/18 1145 07/02/18 1619 07/02/18 2120 07/03/18 0721  GLUCAP 244* 217* 233* 227* 232*   Lipid Profile: No results for input(s): CHOL, HDL, LDLCALC, TRIG, CHOLHDL, LDLDIRECT in the last 72 hours. Thyroid Function Tests: No results for input(s): TSH, T4TOTAL, FREET4, T3FREE,  THYROIDAB in the last 72 hours. Anemia Panel: No results for input(s): VITAMINB12, FOLATE, FERRITIN, TIBC, IRON, RETICCTPCT in the last 72 hours. Urine analysis:    Component Value Date/Time   COLORURINE AMBER (A) 06/07/2018 0900   APPEARANCEUR CLOUDY (A) 06/07/2018 0900   LABSPEC 1.028 06/07/2018 0900   PHURINE 5.0 06/07/2018 0900   GLUCOSEU NEGATIVE 06/07/2018 0900   HGBUR SMALL (A) 06/07/2018 0900   BILIRUBINUR NEGATIVE 06/07/2018 0900   KETONESUR 5 (A) 06/07/2018 0900   PROTEINUR 30 (A) 06/07/2018 0900   NITRITE NEGATIVE 06/07/2018 0900   LEUKOCYTESUR NEGATIVE 06/07/2018 0900   Sepsis Labs: @LABRCNTIP (procalcitonin:4,lacticidven:4)  )No results found for this or any previous visit (from the past 240 hour(s)).    Radiology Studies: Mr Cervical Spine Wo Contrast  Result Date: 07/01/2018 CLINICAL DATA:  Cord compression and edema. EXAM: MRI CERVICAL SPINE WITHOUT CONTRAST TECHNIQUE: Multiplanar, multisequence MR imaging of the cervical spine was performed. No intravenous contrast was administered. COMPARISON:  06/22/2018 FINDINGS: Alignment: Normal Vertebrae: Status post C3-C5 ACDF with C4 corpectomy. Decompression and instrumentation from C2-C5. No acute fracture or discitis. Cord: Compression with edema throughout the operative region, severe with thecal sac narrowing to 2 -3 mm. Posterior Fossa, vertebral arteries, paraspinal tissues: There is a large tense appearing fluid collection with hematocrit levels within the laminectomy defects, deep fascial space, and extending to the skin surface. Edema and intrinsic neck muscles, likely postoperative. Disc levels: C2-3: Negative C3-4: Residual left eccentric ossicles shin of the posterior longitudinal ligament. Left foraminal impingement. 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 Mild left foraminal narrowing C5-6: Disc narrowing and bulging. Mild uncovertebral spurring. Spinal  stenosis from fluid collection exerting mass effect on the dorsal thecal sac. C6-7: Unremarkable. C7-T1:Unremarkable. Results were called by telephone at the time of interpretation on 07/01/2018 at 8:28 pm to Dr. Maurice Small, who is already aware. IMPRESSION: 1. Severe thecal sac effacement with cord edema from C2-3 to C5-6 primarily related to tense posterior fluid collection with hematocrit levels. 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 2. C3-C5 anterior fusion with C4 corpectomy. Electronically Signed   By: Marnee Spring M.D.   On: 07/01/2018 20:29   [place] Ekg Site Rite  Result Date: 07/01/2018 If Site Rite image not attached, placement could not be confirmed due to current cardiac rhythm.    Scheduled Meds: . sodium chloride   Intravenous Once  . sodium chloride   Intravenous Once  . dexamethasone  8 mg Intravenous Q6H  . docusate sodium  100 mg Oral Daily  . doxycycline  100 mg Oral Q12H  . febuxostat  40 mg Oral Daily  .  fluconazole  200 mg Oral Daily  . gabapentin  300 mg Oral TID  . insulin aspart  0-5 Units Subcutaneous QHS  . insulin aspart  0-9 Units Subcutaneous TID WC  . methocarbamol  750 mg Oral QID  . midodrine  5 mg Oral TID WC  . nystatin  5 mL Oral TID AC & HS  . pantoprazole  40 mg Oral Daily  . polyethylene glycol  17 g Oral BID  . senna-docusate  1 tablet Oral QHS   Continuous Infusions: . sodium chloride    . lactated ringers 10 mL/hr at 07/02/18 0320     LOS: 26 days   Time Spent in minutes   30 minutes  Colter Magowan D.O. on 07/03/2018 at 9:39 AM  Between 7am to 7pm - Please see pager noted on [email or web address]  After 7pm go to [email or web address]  And look for the night coverage person covering for me after hours  [facility] Office  [phone]

## 2018-07-03 NOTE — Progress Notes (Signed)
Inpatient Diabetes Program Recommendations  AACE/ADA: New Consensus Statement on Inpatient Glycemic Control (2015)  Target Ranges:  Prepandial:   less than 140 mg/dL      Peak postprandial:   less than 180 mg/dL (1-2 hours)      Critically ill patients:  140 - 180 mg/dL   Lab Results  Component Value Date   GLUCAP 265 (H) 07/03/2018    Review of Glycemic ControlResults for DAEMON, DOWTY (MRN [id number]) as of 07/03/2018 12:04  Ref. Range 07/02/2018 11:45 07/02/2018 16:19 07/02/2018 21:20 07/03/2018 07:21 07/03/2018 11:55  Glucose-Capillary Latest Ref Range: 70 - 99 mg/dL 409 (H) 811 (H) 914 (H) 232 (H) 265 (H)    Diabetes history: None Outpatient Diabetes medications: None Current orders for Inpatient glycemic control:  Novolog sensitive tid with meals and HS, Decadron 8 mg IV q 6 hours Inpatient Diabetes Program Recommendations:    While on Decadron, consider increasing Novolog correction to moderate q 4 hours.    Thanks,  Beryl Meager, RN, BC-ADM Inpatient Diabetes Coordinator Pager [phone] (8a-5p)

## 2018-07-03 NOTE — Progress Notes (Signed)
CSW continuing to follow for discharge needs when more medically stable.   [name] Casco Kenn Rekowski LCSW [phone]

## 2018-07-03 NOTE — Progress Notes (Signed)
Overnight, pt's LT arm and leg have regained some movement. Pt now has a weak grip with LT hand and can slightly wiggle his toes. Pt able to raise arm as well.

## 2018-07-03 NOTE — Progress Notes (Signed)
Occupational Therapy Treatment Patient Details Name: [name] [name] MRN: [id number] DOB: Nov 13, 1965 Today's Date: 07/03/2018    History of present illness 52 y.o. male admitted on 06/06/18 for bil LE wekness and muscle spasm after multiple falls (with resultant R knee meniscus tear placed in bledsoe brace) following a recent ACDF at C3 in July 2019 (complicated by PE (s/p IVC filter), CIR, and then SNF placement before finally returning home with his parents).  In the ED MRI of c-spine which showed post operative changes and cord edema and possibility of ischemia.  Pt also running low grade fevers without any clear source. His R knee was aspirated on 06/09/18.  Pt underwent posterior cervical decompression and fusion on 06/14/18.  Pt had to have re-exploration of posterior cervical wound for evacuation of epidural hematoma and placement of hemovac on 06/22/18.  Entire acute course complicated by hypotension in standing (thought to be autonomic) and by finding of multiple LE DVTs (already has an IVC filter in place, but per MDs notes at risk for blocking the filter with these clots).  Pt with other significant PMH of HTN, gout, anemia, DDD (lumbar).  Pt with post op issues with incisional bleeding s/p 2 evacuations of hematomas and plcement of JP drain (06/22/18 and 07/01/18) as well as multiple units of plasma.     OT comments  Pt with increased L side weakness this session compared to previous sessions, pt reporting having occurred over this past weekend due to most recent events. Pt participated in P/AAROM to LUE across multiple planes both against gravity and in gravity eliminated planes. Further discussion/focus held on use of RUE for completing basic ADL tasks including self-feeding. Will continue to monitor and assess for pt needs to maintain his overall independence with these tasks. Given pt's hospital course and slow progression, have updated discharge recommendations as feel pt will require longer  rehab stay at time of discharge. Will continue to follow acutely to progress pt towards established OT goals.    Follow Up Recommendations  SNF;LTACH;Supervision/Assistance - 24 hour    Equipment Recommendations  None recommended by OT          Precautions / Restrictions Precautions Precautions: Fall;Cervical Precaution Comments: Pt has JP drain Required Braces or Orthoses: Knee Immobilizer - Right;Cervical Brace Knee Immobilizer - Right: On when out of bed or walking Cervical Brace: Hard collar Other Brace/Splint: Pt has two Bledsoe braces, the one we have been using is the long leg brace that is locked in full extension.  Restrictions Weight Bearing Restrictions: No       Mobility Bed Mobility Overal bed mobility: Needs Assistance Bed Mobility: Rolling Rolling: +2 for physical assistance;Max assist         General bed mobility comments: OOB in recliner upon arrival  Transfers                 General transfer comment: pt just recently transferred OOB to recliner via use of maximove with PT                                               ADL either performed or assessed with clinical judgement   ADL Overall ADL's : Needs assistance/impaired Eating/Feeding: Minimal assistance;Sitting Eating/Feeding Details (indicate cue type and reason): pt reports able to feed himself with RUE, though with increased effort/difficulty. Discussed option of using built up  handle for self-feeding/grooming ADLs                                                           Cognition Arousal/Alertness: Awake/alert Behavior During Therapy: WFL for tasks assessed/performed(as good as can be expected) Overall Cognitive Status: Within Functional Limits for tasks assessed                                          Exercises Exercises: General Upper Extremity General Exercises - Upper Extremity Shoulder Flexion: PROM;Left;10  reps;Seated(o-90* ) Shoulder Extension: PROM;Left;10 reps;Seated Shoulder Horizontal ABduction: PROM;10 reps;Seated;Left Shoulder Horizontal ADduction: PROM;10 reps;Left;Seated Elbow Flexion: AAROM;20 reps;Seated;Left(2 sets x10, in gravity eliminated plane) Elbow Extension: AAROM;20 reps;Left;Seated(2 sets x10, gravity eliminated plane) Wrist Flexion: PROM;10 reps;Left;Seated Wrist Extension: PROM;Left;10 reps;Seated Digit Composite Flexion: AROM;10 reps;Seated;Left   Shoulder Instructions       General Comments Re-assessed pt LE strength.  Right is same as previously tested, left ankle PF/DF is trace 1/5, trace quad 1/5, no hip or hamstring noted when tested.  Arm is also significantly weaker than before, pt with weak grip, elbow flexion only in gravity eliminated and unable to lift arm against gravity (shoulder flexion).      Pertinent Vitals/ Pain       Pain Assessment: Faces Faces Pain Scale: Hurts a little bit Pain Location: posterior neck Pain Descriptors / Indicators: Sore Pain Intervention(s): Monitored during session  Home Living                                          Prior Functioning/Environment              Frequency  Min 2X/week        Progress Toward Goals  OT Goals(current goals can now be found in the care plan section)  Progress towards OT goals: OT to reassess next treatment  Acute Rehab OT Goals Patient Stated Goal: to walk again OT Goal Formulation: With patient Time For Goal Achievement: 07/17/18 Potential to Achieve Goals: Good  Plan Discharge plan needs to be updated    Co-evaluation                 AM-PAC PT "6 Clicks" Daily Activity     Outcome Measure   Help from another person eating meals?: A Little Help from another person taking care of personal grooming?: A Little Help from another person toileting, which includes using toliet, bedpan, or urinal?: Total Help from another person bathing (including  washing, rinsing, drying)?: A Lot Help from another person to put on and taking off regular upper body clothing?: A Lot Help from another person to put on and taking off regular lower body clothing?: Total 6 Click Score: 12    End of Session Equipment Utilized During Treatment: Cervical collar  OT Visit Diagnosis: Unsteadiness on feet (R26.81);Pain;Muscle weakness (generalized) (M62.81) Pain - Right/Left: Right Pain - part of body: Knee   Activity Tolerance Patient tolerated treatment well   Patient Left in chair;with call bell/phone within reach;with family/visitor present   Nurse Communication Mobility status  Time: 1610-9604 OT Time Calculation (min): 24 min  Charges: OT General Charges $OT Visit: 1 Visit OT Treatments $Therapeutic Activity: 23-37 mins  Marcy Siren, OT Supplemental Rehabilitation Services Pager [phone] Office [phone]    Orlando Penner 07/03/2018, 4:05 PM

## 2018-07-03 NOTE — Progress Notes (Addendum)
Physical Therapy Treatment/Re-evaluation Patient Details Name: [name] [name] MRN: [id number] DOB: February 24, 1966 Today's Date: 07/03/2018    History of Present Illness 52 y.o. male admitted on 06/06/18 for bil LE wekness and muscle spasm after multiple falls (with resultant R knee meniscus tear placed in bledsoe brace) following a recent ACDF at [facility] in July 2019 (complicated by PE (s/p IVC filter), CIR, and then SNF placement before finally returning home with his parents).  In the ED MRI of c-spine which showed post operative changes and cord edema and possibility of ischemia.  Pt also running low grade fevers without any clear source. His R knee was aspirated on 06/09/18.  Pt underwent posterior cervical decompression and fusion on 06/14/18.  Pt had to have re-exploration of posterior cervical wound for evacuation of epidural hematoma and placement of hemovac on 06/22/18.  Entire acute course complicated by hypotension in standing (thought to be autonomic) and by finding of multiple LE DVTs (already has an IVC filter in place, but per MDs notes at risk for blocking the filter with these clots).  Pt with other significant PMH of HTN, gout, anemia, DDD (lumbar).  Pt with post op issues with incisional bleeding s/p 2 evacuations of hematomas and plcement of JP drain (06/22/18 and 07/01/18) as well as multiple units of plasma.      PT Comments    Pt's left side is now significantly weaker than last session (see notes re: new hematoma evacuation over the weekend).  He has trace ankle and trace quad now with difficulty against gravity in his left arm.  His left leg used to be his strong leg, now his right is his stronger side.  With his new strength deficits, it is not realistic for [place] to continue to try to stand.  We assisted in rolling today and transferring OOB to the recliner chair for some upright/OOB time.  BPs stable 140s/90s throughout.  Plan to work EOB on sitting balance and UEs before transferring with  lift OOB to chair again.  PT will continue to follow acutely for safe mobility progression  Follow Up Recommendations  [facility]     Equipment Recommendations  Wheelchair (measurements PT);Wheelchair cushion (measurements PT);Hospital bed;Other (comment)(hoyer lift)    Recommendations for Other Services   NA     Precautions / Restrictions Precautions Precautions: Fall;Cervical Precaution Comments: Pt has JP drain Required Braces or Orthoses: Knee Immobilizer - Right;Cervical Brace Knee Immobilizer - Right: On when out of bed or walking Cervical Brace: Hard collar Other Brace/Splint: Pt has two Bledsoe braces, the one we have been using is the long leg brace that is locked in full extension.     Mobility  Bed Mobility Overal bed mobility: Needs Assistance Bed Mobility: Rolling Rolling: +2 for physical assistance;Max assist         General bed mobility comments: Two person max assist to roll bil to get pt on lift pad.  Assist needed to flex knees bil and to help pt reach towards the rail (although he is having significant difficulty gripping to the rail to help hold.   Transfers                 General transfer comment: Pt is weaker on his left side now due to events over the weekend (increased R arm and leg weakness and paresthesia and second hematoma evacuation and drain placement ) and is not strong enough at this time to attempt standing even with two person assist.  Balance                                            Cognition Arousal/Alertness: Awake/alert Behavior During Therapy: WFL for tasks assessed/performed(as good as can be expected) Overall Cognitive Status: Within Functional Limits for tasks assessed                                           General Comments General comments (skin integrity, edema, etc.): Re-assessed pt LE strength.  Right is same as previously tested, left ankle PF/DF is trace 1/5, trace  quad 1/5, no hip or hamstring noted when tested.  Arm is also significantly weaker than before, pt with weak grip, elbow flexion only in gravity eliminated and unable to lift arm against gravity (shoulder flexion).        Pertinent Vitals/Pain Pain Assessment: Faces Faces Pain Scale: Hurts even more Pain Location: posterior neck Pain Descriptors / Indicators: Grimacing Pain Intervention(s): Limited activity within patient's tolerance;Monitored during session;Repositioned           PT Goals (current goals can now be found in the care plan section) Acute Rehab PT Goals Patient Stated Goal: to walk again PT Goal Formulation: With patient Time For Goal Achievement: 07/17/18 Potential to Achieve Goals: Fair Progress towards PT goals: Not progressing toward goals - comment;Goals downgraded-see care plan    Frequency    Min 3X/week      PT Plan Discharge plan needs to be updated       AM-PAC PT "6 Clicks" Daily Activity  Outcome Measure  Difficulty turning over in bed (including adjusting bedclothes, sheets and blankets)?: Unable Difficulty moving from lying on back to sitting on the side of the bed? : Unable Difficulty sitting down on and standing up from a chair with arms (e.g., wheelchair, bedside commode, etc,.)?: Unable Help needed moving to and from a bed to chair (including a wheelchair)?: Total Help needed walking in hospital room?: Total Help needed climbing 3-5 steps with a railing? : Total 6 Click Score: 6    End of Session Equipment Utilized During Treatment: Cervical collar Activity Tolerance: Patient limited by pain Patient left: in chair;with call bell/phone within reach;with family/visitor present;with nursing/sitter in room Nurse Communication: Mobility status;Need for lift equipment PT Visit Diagnosis: Other abnormalities of gait and mobility (R26.89);Other symptoms and signs involving the nervous system (R29.898);Muscle weakness (generalized) (M62.81)      Time: 1610-9604 PT Time Calculation (min) (ACUTE ONLY): 42 min  Charges: 1 re-eval  $Therapeutic Activity: 23-37 mins                    Takari Duncombe B. Braydee Shimkus, PT, DPT  Acute Rehabilitation [phone] pager #[phone] office   07/03/2018, 1:35 PM

## 2018-07-03 NOTE — Progress Notes (Signed)
Patient ID: [name] [name], male   DOB: 1966-03-26, 52 y.o.   MRN: [id number] Late entry note events the weekend noted: Patient does have some improvement from Saturday evening increased left upper [name] function still has weak right grip still has weak left lower extremity compared to24 hours prior to surgery Saturday evening.  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 In addition, unclear etiology of his baseline coagulopathy preoperatively his INR was 1.3 on no blood thinners plated Continue to be low at 55 despite the transfusion of 1 unit of platelets. Second unit of platelets transfusion resulted in an increase up to 75.

## 2018-07-04 LAB — BASIC METABOLIC PANEL
Anion gap: 7 (ref 5–15)
BUN: 32 mg/dL — AB (ref 6–20)
CHLORIDE: 105 mmol/L (ref 98–111)
CO2: 27 mmol/L (ref 22–32)
Calcium: 9.1 mg/dL (ref 8.9–10.3)
Creatinine, Ser: 0.78 mg/dL (ref 0.61–1.24)
Glucose, Bld: 180 mg/dL — ABNORMAL HIGH (ref 70–99)
POTASSIUM: 4.5 mmol/L (ref 3.5–5.1)
SODIUM: 139 mmol/L (ref 135–145)

## 2018-07-04 LAB — PROTIME-INR
INR: 1.26
PROTHROMBIN TIME: 15.6 s — AB (ref 11.4–15.2)

## 2018-07-04 LAB — CBC
HCT: 29.5 % — ABNORMAL LOW (ref 39.0–52.0)
HEMOGLOBIN: 9 g/dL — AB (ref 13.0–17.0)
MCH: 29.5 pg (ref 26.0–34.0)
MCHC: 30.5 g/dL (ref 30.0–36.0)
MCV: 96.7 fL (ref 80.0–100.0)
NRBC: 0 % (ref 0.0–0.2)
PLATELETS: 70 10*3/uL — AB (ref 150–400)
RBC: 3.05 MIL/uL — AB (ref 4.22–5.81)
RDW: 16.9 % — ABNORMAL HIGH (ref 11.5–15.5)
WBC: 12.6 10*3/uL — AB (ref 4.0–10.5)

## 2018-07-04 LAB — GLUCOSE, CAPILLARY
GLUCOSE-CAPILLARY: 207 mg/dL — AB (ref 70–99)
Glucose-Capillary: 167 mg/dL — ABNORMAL HIGH (ref 70–99)
Glucose-Capillary: 171 mg/dL — ABNORMAL HIGH (ref 70–99)
Glucose-Capillary: 199 mg/dL — ABNORMAL HIGH (ref 70–99)
Glucose-Capillary: 278 mg/dL — ABNORMAL HIGH (ref 70–99)

## 2018-07-04 LAB — APTT: APTT: 23 s — AB (ref 24–36)

## 2018-07-04 MED ORDER — ARGATROBAN 50 MG/50ML IV SOLN
1.3000 ug/kg/min | INTRAVENOUS | Status: DC
  Administered 2018-07-04: 1 ug/kg/min via INTRAVENOUS
  Administered 2018-07-05 (×3): 1.3 ug/kg/min via INTRAVENOUS
  Filled 2018-07-04 (×6): qty 50

## 2018-07-04 MED ORDER — INFLUENZA VAC SPLIT QUAD 0.5 ML IM SUSY
0.5000 mL | PREFILLED_SYRINGE | INTRAMUSCULAR | Status: DC
  Filled 2018-07-04: qty 0.5

## 2018-07-04 NOTE — Progress Notes (Signed)
Subjective: Patient reports patient doing slightly better slight increase strength left upper extremity grip still weak in the right arm strength. Still no movement left lower extremity  Objective: Vital signs in last 24 hours: Temp:  [98 F (36.7 C)-98.2 F (36.8 C)] 98 F (36.7 C) (10/22 0523) Pulse Rate:  [55-89] 55 (10/22 0523) Resp:  [13-21] 15 (10/22 0523) BP: (126-155)/(77-106) 147/82 (10/22 0523) SpO2:  [97 %-99 %] 98 % (10/22 0523)  Intake/Output from previous day: 10/21 0701 - 10/22 0700 In: 600 [P.O.:600] Out: 633 [Urine:601; Drains:30; Stool:2] Intake/Output this shift: No intake/output data recorded.  slightly improved from yesterday 0-1 out of 5 left lower extremity left upper extremity 4 minus out of 5 right upper extremity 4 out of 5 grip 305  Lab Results: Recent Labs    07/03/18 0717 07/04/18 0433  WBC 11.5* 12.6*  HGB 9.4* 9.0*  HCT 29.5* 29.5*  PLT 75* 70*   BMET Recent Labs    07/03/18 0717 07/04/18 0433  NA 138 139  K 4.8 4.5  CL 104 105  CO2 24 27  GLUCOSE 236* 180*  BUN 35* 32*  CREATININE 1.30* 0.78  CALCIUM 8.8* 9.1    Studies/Results: No results found.  Assessment/Plan: Continue IV Decadron mobilize with physical occupational therapy continue drainage recommend reconsult hematology.  LOS: 27 days     Houston Surges P 07/04/2018, 7:41 AM

## 2018-07-04 NOTE — Progress Notes (Signed)
ANTICOAGULATION CONSULT NOTE - Initial Consult  Pharmacy Consult for Argatroban Indication: DVT - Thromboembolic Disease  Allergies  Allergen Reactions  . Nsaids Other (See Comments)    G6PD DEFICIENCY  . Sulfa Antibiotics Other (See Comments)    G6PD DEFICIENCY  . Protonix [Pantoprazole Sodium] Diarrhea    Severe diarrhea, Upset stomach    Patient Measurements: Height: 5\' 9"  (175.3 cm) Weight: 272 lb 14.9 oz (123.8 kg) IBW/kg (Calculated) : 70.7  Vital Signs: Temp: 98.3 F (36.8 C) (10/22 1600) Temp Source: Oral (10/22 1600) BP: 145/90 (10/22 1600) Pulse Rate: 64 (10/22 1600)  Labs: Recent Labs    07/02/18 0612 07/02/18 1027 07/03/18 0717 07/04/18 0433 07/04/18 0815  HGB 9.4*  --  9.4* 9.0*  --   HCT 29.0*  --  29.5* 29.5*  --   PLT 76*  --  75* 70*  --   APTT 24  --  25 23*  --   LABPROT  --  15.3*  --   --  15.6*  INR  --  1.22  --   --  1.26  CREATININE 0.80  --  1.30* 0.78  --     Estimated Creatinine Clearance: 140.4 mL/min (by C-G formula based on SCr of 0.78 mg/dL).   Medical History: Past Medical History:  Diagnosis Date  . Degenerative disc disease, lumbar   . Gout   . History of pernicious anemia 1980   Patient says he takes OTC iron supplements QD  . Hypertension     Medications:  Scheduled:  . sodium chloride   Intravenous Once  . sodium chloride   Intravenous Once  . dexamethasone  8 mg Intravenous Q6H  . docusate sodium  100 mg Oral Daily  . doxycycline  100 mg Oral Q12H  . febuxostat  40 mg Oral Daily  . gabapentin  300 mg Oral TID  . [START ON 07/05/2018] Influenza vac split quadrivalent PF  0.5 mL Intramuscular Tomorrow-1000  . insulin aspart  0-5 Units Subcutaneous QHS  . insulin aspart  0-9 Units Subcutaneous TID WC  . methocarbamol  750 mg Oral QID  . midodrine  5 mg Oral TID WC  . nystatin  5 mL Oral TID AC & HS  . pantoprazole  40 mg Oral Daily  . polyethylene glycol  17 g Oral BID  . senna-docusate  1 tablet Oral QHS     Assessment: 52 y.o. male  s/p cervical fusion 10/2 and epidural hematoma evacuation 10/10, now with bilateral DVTs and thrombocytopenia.  Pharmacy asked to initiate argatroban therapy  Goal of Therapy:  aPTT 50-90 seconds Monitor platelets by anticoagulation protocol: Yes   Plan:  Start argatroban 1 mcg/kg/min APTT in 2 hours  Jeanella Cara, PharmD, [facility] Clinical Pharmacist Please see AMION for all Pharmacists' Contact Phone Numbers 07/04/2018, 6:21 PM

## 2018-07-04 NOTE — Progress Notes (Signed)
PROGRESS NOTE    [name] [name]  ZOX:[id number] DOB: October 17, 1965 DOA: 06/06/2018 PCP: Devra Dopp, MD   Brief Narrative:  52 year old man with past medical history relevant for gout, hypertension anterior cervical corpectomy of C4 on 03/24/2018 with comp gated postoperative course including development of multiple PEs and DVTs started on anticoagulation and subsequently developed hematoma requiring reexploration on 03/28/2018 status post placement of IVC filter who was discharged to skilled nursing facility and had a complicated course including progressive lower extremity weakness, intermittent fevers of unclear etiology and hospitalizations for "pneumonia".  Patient was admitted to this hospital is a transfer from [facility] with MRI of C-spine that showed postoperative changes and cord compression with cord edema and concern for ischemia.  Patient is status post posterior cervical decompression laminectomy of C3, C4, C5, removal of spinous processes, foraminotomies on 06/14/2018.  On 06/22/2018 patient began to develop worsening left sided paresthesias and weakness and a stat MRI showed evidence of postoperative hematoma.  Patient was taken to the OR with evacuation of hematoma.  Additionally his postoperative course has been complicated by recurrent fevers, bilateral lower extremity DVTs on ultrasound, migratory arthralgias/arthritis that are thought to be rheumatologic in nature as well as significant orthostasis limiting the amount he can work with physical therapy.  Noted to have decreased left hand grip on 10/19, MRI showed large epidural hematoma, patient s/p clot evacuation.   Continues to have thrombocytopenia. Assessment & Plan   Near syncope due to orthostatic hypotension -Likely combination of hypovolemia as well as autonomic dysfunction -Patient was given IV fluids and placed on TED hose -He eventually was started on midodrine -Echocardiogram July 2019 showed a normal  systolic function, grade 2 diastolic dysfunction.  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 -Neurosurgery consulted and appreciated -Patient was noted to have left-sided weakness and numbness -Status post cervical decompressive surgery on 06/14/2018 -Status post evacuation of large hematoma and placement of Hemovac drain on 06/22/2018 -Continue IV dexamethasone per neurosurgery -Pending SNF vs CIR (not sure if patient could withstand CIR, however, feel he needs more monitoring than can be provided at [facility]; will speak with case management regarding LTAC?) -patient with decreased left sided hand grip and weakness on 10/19; MRI C-spine showed large epidural hematoma -S/p OR for clot evacuation on 10/19 -Regaining function of the left upper/lower extremity  Thrombocytopenia/coagulopathy -Neurosurgery concerned about coagulopathy due to recurrent hematomas -Hematology oncology consulted and appreciated, recommended argatroban (currently holding off on starting) -Cardiolipin antibody negative -Lupus anticoagulant, homocysteine level, factor VIII within normal limits -Protein S activity 60 (low) -Protein C total >200; Activity 199 (high)  -unfortunately, drain was pulled on 10/17, patient experienced bleeding on 10/18 -HIT panel 0.486 (mildly elevated)- SRA (serotonin release assay) ordered and pending -immature platelet fraction WNL -Was given 2u platelets on 10/19 in the OR  -platelets 70 today -Continue to monitor CBC  Polyarthralgia/persistent fever -Patient had symptoms in the shoulders and ankles, along with a history of gout and pain in his knees.  He underwent arthrocentesis however no evidence of infection was found. -He was noted to have elevated inflammatory markers including CCP, CRP.  However ANCA, aldolase, ANA were negative. -Previous rounding physician discussed with Dr. Deanne Coffer, rheumatologist, he will follow-up with the  patient as an outpatient. -Patient was placed on prednisone however currently on dexamethasone (neurosurgery) -No longer having fevers  Cough -per patient, improving and he is able to rest some with use of hycodan -continue tessalon,  hycodan PRN  Mild acute renal failure -resolved  Recent PE /DVT in setting of spine surgery -Status post IVC filter placement on March 28, 2018 -Patient was on apixaban however changed over to IV heparin.  Anticoagulation however was discontinued for surgery.  Patient also noted to have thrombocytopenia. -Vascular DVT US 06/26/2018 showed bilateral lower extremity DVTs likely acute -Hematology consulted and appreciated, recommended Argatroban.  However neurosurgery is wanting to hold any type of anticoagulation for an additional 3 days -Discussed with vascular surgery, Dr. Myra Gianotti, patient is out of the 3 week window for treatment, he would have to get TPA, which is contraindicated in a bleeding patient.  -LE with no edema today -Patient had PICC which stopped working, now has a midline -Neurosurgery ordering RUE doppler given edema in RUE - negative for DVT  Essential hypertension -Patient was on amlodipine however was noted to have lower extremity edema and amlodipine was discontinued.  He was also noted to have orthostatic hypotension as noted above.  Gout -Does not seem to be active at this time, continue Uloric  Normocytic anemia -Currently hemoglobin 9.0 -was given 1u PRBC on 10/19 in OR -continue to monitor CBC  DVT Prophylaxis  SCDs  Code Status: Full  Family Communication: None at bedside.  Disposition Plan: Admitted. Dispo pending. Patient currently not medically stable for discharge. Given complexity of patient, continue to monitor in stepdown  Consultants Neurosurgery Orthopedic surgery Infectious disease  Inpatient rehab Vascular surgery , Dr. Myra Gianotti, via phone  Procedures  On 10/2 1. posterior cervical decompressive  laminectomy at C3, C4, C5 with removal of the spinous process and complete laminectomy and foraminotomies of the C3, C4, C5 nerve roots. 2. Posterior cervical fusion with lateral mass screws at C3-C4-C5 and pars screws at C2 lies in the globus ellipseLateral mass screw system 3. Posterior lateral arthrodesis C2-C5 utilizing locally harvested autograft mixed with vivigen  06/22/2018: Procedure reexploration posterior cervical wound for evacuation of epidural hematoma  06/26/2018 DVT ultrasound:Right: Findings consistent with acute deep vein thrombosis involving the right common femoral vein, right femoral vein, right popliteal vein, right posterior tibial vein, and right peroneal vein. Thrombosis extends proximally into external iliac vein.  Proximal iliac vein not visualized. Left: Findings consistent with acute deep vein thrombosis involving the left common femoral vein, left femoral vein, left popliteal vein, left posterior tibial vein, and left peroneal vein. Findings consistent with acute superficial vein thrombosis  involving the left great saphenous vein. Thrombosis extends proximally into external iliac vein. Proximal iliac vein not visualized.  07/01/18 Clot evacuation   RUE doppler 07/02/18  Antibiotics   Anti-infectives (From admission, onward)   Start     Dose/Rate Route Frequency Ordered Stop   07/01/18 2240  bacitracin 50,000 Units in sodium chloride 0.9 % 500 mL irrigation  Status:  Discontinued       As needed 07/01/18 2240 07/01/18 2324   06/30/18 1330  doxycycline (VIBRA-TABS) tablet 100 mg     100 mg Oral Every 12 hours 06/30/18 1329     06/27/18 1000  fluconazole (DIFLUCAN) tablet 200 mg  Status:  Discontinued     200 mg Oral Daily 06/27/18 0725 07/04/18 0720   06/22/18 2315  ceFAZolin (ANCEF) IVPB 2g/100 mL premix     2 g 200 mL/hr over 30 Minutes Intravenous Every 8 hours 06/22/18 2314 06/27/18 1519   06/22/18 1828  bacitracin 50,000 Units in sodium chloride 0.9  % 500 mL irrigation  Status:  Discontinued  As needed 06/22/18 1828 06/22/18 1915   06/22/18 1700  ceFAZolin (ANCEF) 3 g in dextrose 5 % 50 mL IVPB  Status:  Discontinued     3 g 100 mL/hr over 30 Minutes Intravenous  Once 06/22/18 1647 06/22/18 2101   06/14/18 2100  ceFAZolin (ANCEF) IVPB 2g/100 mL premix     2 g 200 mL/hr over 30 Minutes Intravenous Every 8 hours 06/14/18 1712 06/17/18 0800   06/14/18 1414  bacitracin 50,000 Units in sodium chloride 0.9 % 500 mL irrigation  Status:  Discontinued       As needed 06/14/18 1414 06/14/18 1647   06/14/18 1215  ceFAZolin (ANCEF) 3 g in dextrose 5 % 50 mL IVPB     3 g 160 mL/hr over 30 Minutes Intravenous To [facility] 06/14/18 1202 06/14/18 2225   06/14/18 1200  ceFAZolin (ANCEF) IVPB 2g/100 mL premix  Status:  Discontinued     2 g 200 mL/hr over 30 Minutes Intravenous On call to O.R. 06/14/18 1157 06/14/18 2140      Subjective:   [name] [name] seen and examined today.  Patient feeling better today.  Continues to feel weak and have weakness in the left side of his body however able to move his toes and left upper extremity more.  Denies current chest pain, shortness of breath, abdominal pain, nausea vomiting, diarrhea or constipation, dizziness or headache.  Objective:   Vitals:   07/04/18 0523 07/04/18 0735 07/04/18 0800 07/04/18 0900  BP: (!) 147/82 (!) 142/88 126/79   Pulse: (!) 55 (!) 53 75   Resp: 15 13 18    Temp: 98 F (36.7 C) 97.8 F (36.6 C)  97.8 F (36.6 C)  TempSrc: Oral Oral    SpO2: 98% 100% 99%   Weight:      Height:        Intake/Output Summary (Last 24 hours) at 07/04/2018 1201 Last data filed at 07/04/2018 0900 Gross per 24 hour  Intake 1320 ml  Output 1063 ml  Net 257 ml   Filed Weights   06/06/18 2246 06/14/18 1151 06/22/18 1641  Weight: 123.8 kg 123.8 kg 123.8 kg   Exam  General: Well developed, chronically ill-appearing, NAD  HEENT: NCAT, mucous membranes moist.   Neck:  Collar  Cardiovascular: S1 S2 auscultated, no rubs, murmurs or gallops. Regular rate and rhythm.  Respiratory: Clear to auscultation bilaterally with equal chest rise  Abdomen: Soft, obese, nontender, nondistended, + bowel sounds  Extremities: warm dry without cyanosis clubbing or edema. TED hose in palce  Neuro: AAOx3, nonfocal.  LUE with more movement; able to move left toes  Skin: Without rashes exudates or nodules  Psych: Normal affect and demeanor with intact judgement and insight, pleasant   Data Reviewed: I have personally reviewed following labs and imaging studies  CBC: Recent Labs  Lab 06/28/18 0500 06/29/18 0646 06/30/18 0500 07/01/18 0606 07/02/18 0612 07/03/18 0717 07/04/18 0433  WBC 13.5* 12.8* 11.6* 10.6* 10.5 11.5* 12.6*  NEUTROABS 11.6* 10.8* 10.0* 8.9* 9.5*  --   --   HGB 9.1* 9.3* 8.7* 8.7* 9.4* 9.4* 9.0*  HCT 30.9* 31.3* 28.7* 28.9* 29.0* 29.5* 29.5*  MCV 96.3 96.3 96.6 95.4 94.2 97.0 96.7  PLT 65* 65* 56* 55* 76* 75* 70*   Basic Metabolic Panel: Recent Labs  Lab 07/01/18 0606 07/02/18 0612 07/03/18 0717 07/04/18 0433  NA 137 136 138 139  K 4.4 4.5 4.8 4.5  CL 104 100 104 105  CO2 27 26 24  27  GLUCOSE 168* 199* 236* 180*  BUN 30* 31* 35* 32*  CREATININE 0.89 0.80 1.30* 0.78  CALCIUM 8.8* 8.9 8.8* 9.1   GFR: Estimated Creatinine Clearance: 140.4 mL/min (by C-G formula based on SCr of 0.78 mg/dL). Liver Function Tests: No results for input(s): AST, ALT, ALKPHOS, BILITOT, PROT, ALBUMIN in the last 168 hours. No results for input(s): LIPASE, AMYLASE in the last 168 hours. No results for input(s): AMMONIA in the last 168 hours. Coagulation Profile: Recent Labs  Lab 06/30/18 1059 07/01/18 0854 07/02/18 1027 07/04/18 0815  INR 1.33 1.20 1.22 1.26   Cardiac Enzymes: No results for input(s): CKTOTAL, CKMB, CKMBINDEX, TROPONINI in the last 168 hours. BNP (last 3 results) No results for input(s): PROBNP in the last 8760 hours. HbA1C: No  results for input(s): HGBA1C in the last 72 hours. CBG: Recent Labs  Lab 07/03/18 1155 07/03/18 1645 07/03/18 2127 07/04/18 0000 07/04/18 0739  GLUCAP 265* 247* 240* 199* 171*   Lipid Profile: No results for input(s): CHOL, HDL, LDLCALC, TRIG, CHOLHDL, LDLDIRECT in the last 72 hours. Thyroid Function Tests: No results for input(s): TSH, T4TOTAL, FREET4, T3FREE, THYROIDAB in the last 72 hours. Anemia Panel: No results for input(s): VITAMINB12, FOLATE, FERRITIN, TIBC, IRON, RETICCTPCT in the last 72 hours. Urine analysis:    Component Value Date/Time   COLORURINE AMBER (A) 06/07/2018 0900   APPEARANCEUR CLOUDY (A) 06/07/2018 0900   LABSPEC 1.028 06/07/2018 0900   PHURINE 5.0 06/07/2018 0900   GLUCOSEU NEGATIVE 06/07/2018 0900   HGBUR SMALL (A) 06/07/2018 0900   BILIRUBINUR NEGATIVE 06/07/2018 0900   KETONESUR 5 (A) 06/07/2018 0900   PROTEINUR 30 (A) 06/07/2018 0900   NITRITE NEGATIVE 06/07/2018 0900   LEUKOCYTESUR NEGATIVE 06/07/2018 0900   Sepsis Labs: @LABRCNTIP (procalcitonin:4,lacticidven:4)  )No results found for this or any previous visit (from the past 240 hour(s)).    Radiology Studies: No results found.   Scheduled Meds: . sodium chloride   Intravenous Once  . sodium chloride   Intravenous Once  . dexamethasone  8 mg Intravenous Q6H  . docusate sodium  100 mg Oral Daily  . doxycycline  100 mg Oral Q12H  . febuxostat  40 mg Oral Daily  . gabapentin  300 mg Oral TID  . [START ON 07/05/2018] Influenza vac split quadrivalent PF  0.5 mL Intramuscular Tomorrow-1000  . insulin aspart  0-5 Units Subcutaneous QHS  . insulin aspart  0-9 Units Subcutaneous TID WC  . methocarbamol  750 mg Oral QID  . midodrine  5 mg Oral TID WC  . nystatin  5 mL Oral TID AC & HS  . pantoprazole  40 mg Oral Daily  . polyethylene glycol  17 g Oral BID  . senna-docusate  1 tablet Oral QHS   Continuous Infusions: . sodium chloride    . lactated ringers 10 mL/hr at 07/02/18 0320      LOS: 27 days   Time Spent in minutes   30 minutes  Ashiya Kinkead D.O. on 07/04/2018 at 12:01 PM  Between 7am to 7pm - Please see pager noted on [email or web address]  After 7pm go to [email or web address]  And look for the night coverage person covering for me after hours  [facility] Office  [phone]

## 2018-07-05 DIAGNOSIS — R001 Bradycardia, unspecified: Secondary | ICD-10-CM

## 2018-07-05 LAB — APTT
APTT: 44 s — AB (ref 24–36)
aPTT: 50 seconds — ABNORMAL HIGH (ref 24–36)
aPTT: 51 seconds — ABNORMAL HIGH (ref 24–36)
aPTT: 60 seconds — ABNORMAL HIGH (ref 24–36)

## 2018-07-05 LAB — MAGNESIUM: Magnesium: 2.1 mg/dL (ref 1.7–2.4)

## 2018-07-05 LAB — CBC
HCT: 30.4 % — ABNORMAL LOW (ref 39.0–52.0)
Hemoglobin: 9.3 g/dL — ABNORMAL LOW (ref 13.0–17.0)
MCH: 29.9 pg (ref 26.0–34.0)
MCHC: 30.6 g/dL (ref 30.0–36.0)
MCV: 97.7 fL (ref 80.0–100.0)
NRBC: 0 % (ref 0.0–0.2)
PLATELETS: 76 10*3/uL — AB (ref 150–400)
RBC: 3.11 MIL/uL — ABNORMAL LOW (ref 4.22–5.81)
RDW: 17 % — AB (ref 11.5–15.5)
WBC: 10.4 10*3/uL (ref 4.0–10.5)

## 2018-07-05 LAB — BASIC METABOLIC PANEL
ANION GAP: 8 (ref 5–15)
BUN: 38 mg/dL — ABNORMAL HIGH (ref 6–20)
CALCIUM: 9 mg/dL (ref 8.9–10.3)
CO2: 25 mmol/L (ref 22–32)
Chloride: 106 mmol/L (ref 98–111)
Creatinine, Ser: 0.83 mg/dL (ref 0.61–1.24)
Glucose, Bld: 227 mg/dL — ABNORMAL HIGH (ref 70–99)
Potassium: 4.7 mmol/L (ref 3.5–5.1)
Sodium: 139 mmol/L (ref 135–145)

## 2018-07-05 LAB — SEROTONIN RELEASE ASSAY (SRA)
SRA, HIGH DOSE HEPARIN: 5 % (ref 0–20)
SRA, LOW DOSE HEPARIN: 4 % (ref 0–20)

## 2018-07-05 LAB — TROPONIN I
Troponin I: <0.03 ng/mL (ref <0.03)
Troponin I: <0.03 ng/mL (ref <0.03)

## 2018-07-05 LAB — GLUCOSE, CAPILLARY
GLUCOSE-CAPILLARY: 251 mg/dL — AB (ref 70–99)
Glucose-Capillary: 241 mg/dL — ABNORMAL HIGH (ref 70–99)
Glucose-Capillary: 196 mg/dL — ABNORMAL HIGH (ref 70–99)
Glucose-Capillary: 171 mg/dL — ABNORMAL HIGH (ref 70–99)

## 2018-07-05 MED ORDER — SODIUM CHLORIDE 0.9 % IV SOLN
0.1500 mg/kg/h | INTRAVENOUS | Status: DC
  Administered 2018-07-05 – 2018-07-06 (×3): 0.15 mg/kg/h via INTRAVENOUS
  Filled 2018-07-05 (×4): qty 250

## 2018-07-05 NOTE — Consult Note (Addendum)
Cardiology Consultation:   Patient ID: [name] [name] MRN: [id number]; DOB: 1966/06/07  Admit date: 06/06/2018 Date of Consult: 07/05/2018  Primary Care Provider: Devra Dopp, MD Primary Cardiologist: Charlton Haws, MD  Primary Electrophysiologist:  None    Patient Profile:   [name] [name] is a 52 y.o. male with a hx of DVT and PE, on eliquis in July, recent spinal cord decompression and ACDF complicated by bleeding and hematoma, hx of bradycardia after surgery 03/2018, HTN, and gout who is being seen today for the evaluation of bradycardia at the request of Dr. Sunnie Nielsen.  History of Present Illness:   Mr. Genova has had a complex medical course since July 2019. He underwent anterior cervical corpectomy and discharged from rehab. Course was complicated by bilateral PE's and bilateral DVTs placed on heparin and coumadin. IVC filter placed 03/28/18 after reexploration of his neck, found to have excessive bleeding and hematoma.  In August 2019, he suffered a fall and tore meniscus in both knees. He intermittently had fevers and was sent back to rehab. He has had multiple falls stating his legs buckle. He re-presented to [facility] from [facility] with bilateral lower extremity weakness and myelopathy with concern for discitis infection vs epidural abscess vs septic joint. Neurosurgery is following. He underwent staged posterior cervical decompression and fusion on 06/14/18 and 06/22/18. Course was complicated by posterior cervical epidural hematoma requiring evacuation on 07/01/18. Found ot have excessive bleeding. He then had coagulopathy and hematology was consulted. He has not been on antiocagulation. He has known G6PD deficiency. 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  He was diagnosed with severe thrombocytopenia with excessive bleeding as well as extensive DVTs. Serotonin release assay is pending. He has been started on argatroban. If HIT, then he will be transitioned to coumadin. If serotonin release assay is negative,  then he can transition to xarelto.   Pt had a near syncopal episode at the beginning of this hospitalization thought to be due to orthostatic hypotension and autonomic dysfunction. He was given IVF, TED hose, and started on midodrine. Echo 03/2017 with normal LVEF and grade 2 DD, but no significant valve abnormalities. Yesterday, he was also noted to be bradycardic. Cardiology was consulted. On my interview, he was started on argatroban yesterday. Overnight 07/04/18, he was found to be bradycardic in the 40s, no heart block. Argatroban has a risk of bradycardia and hypotension, likely due to vasodilation effects. Ambulation has been held due to extensive DVTs. He has been asymptomatic.  He denies chest pain, SOB, and dizziness.   Past Medical History:  Diagnosis Date  . Degenerative disc disease, lumbar   . Gout   . History of pernicious anemia 1980   Patient says he takes OTC iron supplements QD  . Hypertension     Past Surgical History:  Procedure Laterality Date  . ANTERIOR CERVICAL CORPECTOMY N/A 03/24/2018   Procedure: CERVICAL FOUR ANTERIOR CERVICAL CORPECTOMY;  Surgeon: Donalee Citrin, MD;  Location: [facility] OR;  Service: Neurosurgery;  Laterality: N/A;  . ANTERIOR CERVICAL DECOMP/DISCECTOMY FUSION N/A 03/28/2018   Procedure: Reexploration of anterior cervical wound;  Surgeon: Donalee Citrin, MD;  Location: [facility] OR;  Service: Neurosurgery;  Laterality: N/A;  . IR IVC FILTER PLMT / S&I Lenise Arena GUID/MOD SED  03/28/2018  . IR RADIOLOGIST EVAL & MGMT  05/31/2018  . MENISCUS REPAIR Left   . POSTERIOR CERVICAL FUSION/FORAMINOTOMY N/A 06/14/2018   Procedure: POSTERIOR CERVICAL DECOMPRESSION/FUSION CERVICAL TWO- CERVICAL THREE CERVICAL THREE- CERVICAL FOUR, CERVICAL FOUR- CERVICAL FIVE;  Surgeon: Donalee Citrin, MD;  Location: [facility] OR;  Service: Neurosurgery;  Laterality: N/A;  POSTERIOR CERVICAL DECOMPRESSION/FUSION CERVICAL TWO- CERVICAL THREE CERVICAL THREE- CERVICAL FOUR, CERVICAL FOUR- CERVICAL FIVE  . POSTERIOR  CERVICAL LAMINECTOMY N/A 06/22/2018   Procedure: POSTERIOR CERVICAL LAMINECTOMY FOR EVACUATION OF HEMATOMA;  Surgeon: Donalee Citrin, MD;  Location: [facility] OR;  Service: Neurosurgery;  Laterality: N/A;  . POSTERIOR CERVICAL LAMINECTOMY FOR EPIDURAL ABSCESS N/A 07/01/2018   Procedure: EVACUATION OF POSTERIOR CERVICAL HEMATOMA;  Surgeon: Jadene Pierini, MD;  Location: [facility] OR;  Service: Neurosurgery;  Laterality: N/A;  . TENDON REPAIR     tricept tendon     Home Medications:  Prior to Admission medications   Medication Sig Start Date End Date Taking? Authorizing Provider  acetaminophen (TYLENOL) 325 MG tablet Take 2 tablets (650 mg total) by mouth every 4 (four) hours as needed for mild pain ((score 1 to 3) or temp > 100.5). 05/02/18  Yes Angiulli, Mcarthur Rossetti, PA-C  apixaban (ELIQUIS) 5 MG TABS tablet Take 1 tablet (5 mg total) by mouth 2 (two) times daily. 05/02/18  Yes Angiulli, Mcarthur Rossetti, PA-C  aspirin EC 81 MG EC tablet Take 1 tablet (81 mg total) by mouth daily. 05/02/18  Yes Angiulli, Mcarthur Rossetti, PA-C  baclofen (LIORESAL) 10 MG tablet Take 1 tablet (10 mg total) by mouth 2 (two) times daily as needed for muscle spasms. 05/02/18  Yes Angiulli, Mcarthur Rossetti, PA-C  colchicine 0.6 MG tablet Take 1 tablet (0.6 mg total) by mouth daily. 05/02/18  Yes Angiulli, Mcarthur Rossetti, PA-C  Cyanocobalamin 5000 MCG/ML LIQD Place 5,000 mcg under the tongue daily. Patient taking differently: Place 5,000 mcg under the tongue as needed.  05/02/18  Yes Angiulli, Mcarthur Rossetti, PA-C  hydrochlorothiazide (MICROZIDE) 12.5 MG capsule Take 1 capsule (12.5 mg total) by mouth daily. 05/02/18  Yes Angiulli, Mcarthur Rossetti, PA-C  oxyCODONE 10 MG TABS Take 1 tablet (10 mg total) by mouth every 3 (three) hours as needed for severe pain ((score 7 to 10)). 05/02/18  Yes Angiulli, Mcarthur Rossetti, PA-C  saccharomyces boulardii (FLORASTOR) 250 MG capsule Take 1 capsule (250 mg total) by mouth 2 (two) times daily. Patient not taking: Reported on 06/08/2018 05/02/18    Charlton Amor, PA-C    Inpatient Medications: Scheduled Meds: . sodium chloride   Intravenous Once  . sodium chloride   Intravenous Once  . dexamethasone  8 mg Intravenous Q6H  . docusate sodium  100 mg Oral Daily  . doxycycline  100 mg Oral Q12H  . febuxostat  40 mg Oral Daily  . gabapentin  300 mg Oral TID  . Influenza vac split quadrivalent PF  0.5 mL Intramuscular Tomorrow-1000  . insulin aspart  0-5 Units Subcutaneous QHS  . insulin aspart  0-9 Units Subcutaneous TID WC  . methocarbamol  750 mg Oral QID  . midodrine  5 mg Oral TID WC  . nystatin  5 mL Oral TID AC & HS  . pantoprazole  40 mg Oral Daily  . polyethylene glycol  17 g Oral BID  . senna-docusate  1 tablet Oral QHS   Continuous Infusions: . sodium chloride    . argatroban 1.3 mcg/kg/min (07/05/18 1022)  . lactated ringers 10 mL/hr at 07/02/18 0320   PRN Meds: acetaminophen **OR** acetaminophen, ALPRAZolam, alum & mag hydroxide-simeth, benzonatate, bisacodyl, cyclobenzaprine, HYDROcodone-homatropine, HYDROmorphone (DILAUDID) injection, menthol-cetylpyridinium **OR** phenol, morphine injection, ondansetron **OR** ondansetron (ZOFRAN) IV, oxyCODONE, oxyCODONE, sodium chloride flush, sodium chloride flush  Allergies:    Allergies  Allergen Reactions  .  Nsaids Other (See Comments)    G6PD DEFICIENCY  . Sulfa Antibiotics Other (See Comments)    G6PD DEFICIENCY  . Protonix [Pantoprazole Sodium] Diarrhea    Severe diarrhea, Upset stomach    Social History:   Social History   Socioeconomic History  . Marital status: Legally Separated    Spouse name: Not on file  . Number of children: Not on file  . Years of education: Not on file  . Highest education level: Not on file  Occupational History  . Not on file  Social Needs  . Financial resource strain: Not on file  . Food insecurity:    Worry: Not on file    Inability: Not on file  . Transportation needs:    Medical: Not on file    Non-medical: Not on  file  Tobacco Use  . Smoking status: Never Smoker  . Smokeless tobacco: Never Used  Substance and Sexual Activity  . Alcohol use: Never    Frequency: Never  . Drug use: Never  . Sexual activity: Not Currently    Birth control/protection: Abstinence  Lifestyle  . Physical activity:    Days per week: Not on file    Minutes per session: Not on file  . Stress: Not on file  Relationships  . Social connections:    Talks on phone: Not on file    Gets together: Not on file    Attends religious service: Not on file    Active member of club or organization: Not on file    Attends meetings of clubs or organizations: Not on file    Relationship status: Not on file  . Intimate partner violence:    Fear of current or ex partner: Not on file    Emotionally abused: Not on file    Physically abused: Not on file    Forced sexual activity: Not on file  Other Topics Concern  . Not on file  Social History Narrative  . Not on file    Family History:    Family History  Family history unknown: Yes     ROS:  Please see the history of present illness.   All other ROS reviewed and negative.     Physical Exam/Data:   Vitals:   07/04/18 2300 07/05/18 0248 07/05/18 0723 07/05/18 1131  BP:  (!) 144/84 (!) 158/92 (!) 157/88  Pulse:  (!) 52 64 (!) 41  Resp:  12 20 14   Temp: 98 F (36.7 C) 97.7 F (36.5 C) 97.8 F (36.6 C) 97.7 F (36.5 C)  TempSrc: Oral Oral Oral Oral  SpO2:  97% 100% 100%  Weight:      Height:        Intake/Output Summary (Last 24 hours) at 07/05/2018 1343 Last data filed at 07/05/2018 1249 Gross per 24 hour  Intake 573.26 ml  Output 1505 ml  Net -931.74 ml   Filed Weights   06/06/18 2246 06/14/18 1151 06/22/18 1641  Weight: 123.8 kg 123.8 kg 123.8 kg   Body mass index is 40.3 kg/m.  General:  Well nourished, well developed, in no acute distress HEENT: normal Neck: cervical collar in place Cardiac:  normal S1, S2; RRR; no murmur Lungs:  clear to  auscultation bilaterally, no wheezing, rhonchi or rales, diminished in bases on anterior lung exam Abd: soft, nontender, no hepatomegaly  Ext: no edema Musculoskeletal:  No deformities, BUE and BLE strength normal and equal Skin: warm and dry  Neuro:  CNs 2-12 intact, no focal  abnormalities noted Psych:  Normal affect   EKG:  The EKG was personally reviewed and demonstrates:  Sinus bradycardia with RBBB Telemetry:  Telemetry was personally reviewed and demonstrates:  Sinus to sinus bradycardia  Relevant CV Studies:  Echo 03/2018: Study Conclusions - Left ventricle: The cavity size was mildly dilated. Wall   thickness was normal. Systolic function was normal. The estimated   ejection fraction was in the range of 50% to 55%. Wall motion was   normal; there were no regional wall motion abnormalities.   Features are consistent with a pseudonormal left ventricular   filling pattern, with concomitant abnormal relaxation and   increased filling pressure (grade 2 diastolic dysfunction).  Impressions: - Technically difficult; definity used; normal LV systolic   function; moderate diastolic dysfunction; mild LVE.  Laboratory Data:  Chemistry Recent Labs  Lab 07/03/18 0717 07/04/18 0433 07/05/18 0513  NA 138 139 139  K 4.8 4.5 4.7  CL 104 105 106  CO2 24 27 25   GLUCOSE 236* 180* 227*  BUN 35* 32* 38*  CREATININE 1.30* 0.78 0.83  CALCIUM 8.8* 9.1 9.0  GFRNONAA >60 >60 >60  GFRAA >60 >60 >60  ANIONGAP 10 7 8     No results for input(s): PROT, ALBUMIN, AST, ALT, ALKPHOS, BILITOT in the last 168 hours. Hematology Recent Labs  Lab 07/03/18 0717 07/04/18 0433 07/05/18 0513  WBC 11.5* 12.6* 10.4  RBC 3.04* 3.05* 3.11*  HGB 9.4* 9.0* 9.3*  HCT 29.5* 29.5* 30.4*  MCV 97.0 96.7 97.7  MCH 30.9 29.5 29.9  MCHC 31.9 30.5 30.6  RDW 16.9* 16.9* 17.0*  PLT 75* 70* 76*   Cardiac Enzymes Recent Labs  Lab 07/05/18 1111  TROPONINI <0.03   No results for input(s): TROPIPOC in  the last 168 hours.  BNPNo results for input(s): BNP, PROBNP in the last 168 hours.  DDimer No results for input(s): DDIMER in the last 168 hours.  Radiology/Studies:  Mr Cervical Spine Wo Contrast  Result Date: 07/01/2018 CLINICAL DATA:  Cord compression and edema. EXAM: MRI CERVICAL SPINE WITHOUT CONTRAST TECHNIQUE: Multiplanar, multisequence MR imaging of the cervical spine was performed. No intravenous contrast was administered. COMPARISON:  06/22/2018 FINDINGS: Alignment: Normal Vertebrae: Status post C3-C5 ACDF with C4 corpectomy. Decompression and instrumentation from C2-C5. No acute fracture or discitis. Cord: Compression with edema throughout the operative region, severe with thecal sac narrowing to 2 -3 mm. Posterior Fossa, vertebral arteries, paraspinal tissues: There is a large tense appearing fluid collection with hematocrit levels within the laminectomy defects, deep fascial space, and extending to the skin surface. Edema and intrinsic neck muscles, likely postoperative. Disc levels: C2-3: Negative C3-4: Residual left eccentric ossicles shin of the posterior longitudinal ligament. Left foraminal impingement. 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 Mild left foraminal narrowing C5-6: Disc narrowing and bulging. Mild uncovertebral spurring. Spinal stenosis from fluid collection exerting mass effect on the dorsal thecal sac. C6-7: Unremarkable. C7-T1:Unremarkable. Results were called by telephone at the time of interpretation on 07/01/2018 at 8:28 pm to Dr. Maurice Small, who is already aware. IMPRESSION: 1. Severe thecal sac effacement with cord edema from C2-3 to C5-6 primarily related to tense posterior fluid collection with hematocrit levels. 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 2. C3-C5 anterior fusion with C4 corpectomy. Electronically Signed   By: Marnee Spring M.D.   On: 07/01/2018 20:29   Assessment and Plan:   1. Sinus  Bradycardia - EKG with sinus bradycardia and new RBBB - telemetry with bouts of sinus in the 70s and sinus bradycardia in the 40s - no evidence of heart block or arrhthymias on telemetry - bradycardia timing aligned with initiation of argatroban gtt, potential for bradycardia and hypotension - given his overall clinical picture, would continue current medications. Avoid AV nodal agents. Up with assistance only.  2. Hypotension - midodrine held this afternoon - pressure is 157/88 - likely doesn't need midodrine  For questions or updates, please contact [facility] Please consult [email or web address] for contact info under   Signed, Marcelino Duster, PA  07/05/2018 1:43 PM  The patient was seen, examined and discussed with Duane Boston , PA-C and I agree with the above.   52 y.o. male who underwent anterior cervical corpectomy in July 2019, complicated by bilateral PE's and bilateral DVTs placed on heparin and coumadin. IVC filter placed 03/28/18 after reexploration of his neck, found to have excessive bleeding and hematoma.  In August 2019, he suffered a fall and tore meniscus in both knees. Readmitted to [facility] from [facility] with bilateral lower extremity weakness and myelopathy with concern for discitis infection vs epidural abscess vs septic joint. S/P staged posterior cervical decompression and fusion on 06/14/18 and 06/22/18 complicated by posterior cervical epidural hematoma requiring evacuation on 07/01/18 complicated by excessive bleeding. He then had coagulopathy and hematology was consulted. He has not been on antiocagulation. He has known G6PD deficiency. 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  He was diagnosed with severe thrombocytopenia with excessive bleeding as well as extensive DVTs. Serotonin release assay is pending. He has been started on argatroban. If HIT, then he will be transitioned to coumadin. If serotonin release assay is  negative, then he can transition to xarelto.  Pt had a near syncopal episode at the beginning of this hospitalization thought to be due to orthostatic hypotension and autonomic dysfunction. He was given IVF, TED hose, and started on midodrine. Echo 03/2017 with normal LVEF and grade 2 DD, but no significant valve abnormalities. Yesterday, he was also noted to be bradycardic. Cardiology was consulted. On my interview, he was started on argatroban yesterday. Overnight 07/04/18, he was found to be bradycardic in the 40s, no heart block. Argatroban has a risk of bradycardia and hypotension, likely due to vasodilation effects. Ambulation has been held due to extensive DVTs. He has been asymptomatic.  He denies chest pain, SOB, and dizziness.   This is most probably multifactorial - cord compression, autonomic dysfunction and initiation of argatroban: I would continue midodrine 5 mg PO TID, add abdominal binder, increase bed angle to 20%.  The patient will be eventually transitioned to coumadin or xarelto depending on results of HIT and serotonin studies. In the meantime we will switch argatroban to bivalirudin, we will ask pharmacy for help.  For sinus bradycardia there is currently no indication for pacing. We will follow.   Tobias Alexander, MD 07/05/2018

## 2018-07-05 NOTE — Progress Notes (Signed)
ANTICOAGULATION CONSULT NOTE - Follow Up Consult  Pharmacy Consult for argatroban Indication: DVT  Labs: Recent Labs    07/02/18 1027  07/03/18 0717 07/04/18 0433 07/04/18 0815 07/04/18 2327 07/05/18 0513  HGB  --    < > 9.4* 9.0*  --   --  9.3*  HCT  --   --  29.5* 29.5*  --   --  30.4*  PLT  --   --  75* 70*  --   --  76*  APTT  --    < > 25 23*  --  44* 50*  LABPROT 15.3*  --   --   --  15.6*  --   --   INR 1.22  --   --   --  1.26  --   --   CREATININE  --   --  1.30* 0.78  --   --  0.83   < > = values in this interval not displayed.    Assessment/Plan:  52yo male therapeutic on argatroban after rate change. Will continue gtt at current rate and confirm stable with additional PTT.   Vernard Gambles, PharmD, BCPS  07/05/2018,7:06 AM

## 2018-07-05 NOTE — Progress Notes (Signed)
HEMATOLOGY-ONCOLOGY PROGRESS NOTE  SUBJECTIVE: Reviewed the patient's chart and met him this morning.  He is in much better spirits today.  He is able to move his extremities.  Bleeding has finally subsided.  His platelet count is slowly starting to go up.  He was started on argatroban yesterday.  OBJECTIVE: REVIEW OF SYSTEMS:   Constitutional: Denies fevers, chills or abnormal weight loss Eyes: Denies blurriness of vision Ears, nose, mouth, throat, and face: Denies mucositis or sore throat Respiratory: Denies cough, dyspnea or wheezes Cardiovascular: Denies palpitation, chest discomfort Gastrointestinal:  Denies nausea, heartburn or change in bowel habits Skin: Denies abnormal skin rashes Lymphatics: Denies new lymphadenopathy or easy bruising Neurological:weakness in the left upper extremity but able to move Behavioral/Psych: Mood is stable, no new changes  Extremities: Bilateral lower extremity edema   All other systems were reviewed with the patient and are negative.  I have reviewed the past medical history, past surgical history, social history and family history with the patient and they are unchanged from previous note.   PHYSICAL EXAMINATION: ECOG PERFORMANCE STATUS: 1 - Symptomatic but completely ambulatory  Vitals:   07/05/18 0723 07/05/18 1131  BP: (!) 158/92 (!) 157/88  Pulse: 64 (!) 41  Resp: 20 14  Temp: 97.8 F (36.6 C) 97.7 F (36.5 C)  SpO2: 100% 100%   Filed Weights   06/06/18 2246 06/14/18 1151 06/22/18 1641  Weight: 272 lb 14.9 oz (123.8 kg) 272 lb 14.9 oz (123.8 kg) 272 lb 14.9 oz (123.8 kg)    GENERAL:alert, no distress and comfortable SKIN: skin color, texture, turgor are normal, no rashes or significant lesions EYES: normal, Conjunctiva are pink and non-injected, sclera clear OROPHARYNX:no exudate, no erythema and lips, buccal mucosa, and tongue normal  NECK: supple, thyroid normal size, non-tender, without nodularity LYMPH:  no palpable  lymphadenopathy in the cervical, axillary or inguinal LUNGS: clear to auscultation and percussion with normal breathing effort HEART: regular rate & rhythm and no murmurs and no lower extremity edema ABDOMEN:abdomen soft, non-tender and normal bowel sounds Musculoskeletal:no cyanosis of digits and no clubbing  NEURO: Able to move extremities  LABORATORY DATA:  I have reviewed the data as listed CMP Latest Ref Rng & Units 07/05/2018 07/04/2018 07/03/2018  Glucose 70 - 99 mg/dL 227(H) 180(H) 236(H)  BUN 6 - 20 mg/dL 38(H) 32(H) 35(H)  Creatinine 0.61 - 1.24 mg/dL 0.83 0.78 1.30(H)  Sodium 135 - 145 mmol/L 139 139 138  Potassium 3.5 - 5.1 mmol/L 4.7 4.5 4.8  Chloride 98 - 111 mmol/L 106 105 104  CO2 22 - 32 mmol/L '25 27 24  ' Calcium 8.9 - 10.3 mg/dL 9.0 9.1 8.8(L)  Total Protein 6.5 - 8.1 g/dL - - -  Total Bilirubin 0.3 - 1.2 mg/dL - - -  Alkaline Phos 38 - 126 U/L - - -  AST 15 - 41 U/L - - -  ALT 0 - 44 U/L - - -    Lab Results  Component Value Date   WBC 10.4 07/05/2018   HGB 9.3 (L) 07/05/2018   HCT 30.4 (L) 07/05/2018   MCV 97.7 07/05/2018   PLT 76 (L) 07/05/2018   NEUTROABS 9.5 (H) 07/02/2018    ASSESSMENT AND PLAN: 1.  Severe thrombocytopenia: Multifactorial most likely from consumption due to extensive DVTs as well as HIT.  Serotonin release assay is pending Currently on argatroban. Recommend continuation of her gastric band for a couple more days until we are sure that there would not be any  bleeding risk.  After that we can transition to oral anticoagulation treatments. If the patient has HIT, we could consider Coumadin. If the serotonin release assay is negative then patient can go on Xarelto.  2. neck surgery complicated by postoperative hematoma requiring evacuation now with improving neurological function.  3.  Severe and extensive DVTs: Resumed anticoagulation.

## 2018-07-05 NOTE — Progress Notes (Signed)
PT Cancellation Note  Patient Details Name: [name] [name] MRN: [id number] DOB: July 16, 1966   Cancelled Treatment:    Reason Eval/Treat Not Completed: Medical issues which prohibited therapy.  Pt getting an EKG now, HR in the 40s.  RN asked PT to check back this PM to possibly lift out of bed so she can change beds (to ICU bed so that he can be positioned in a more upright chair position in the bed).  PT is hopeful to assist pt to EOB to work on trunk control and fully upright sitting tolerance.  I will check back later.    Thanks,  Rollene Rotunda. Erinne Gillentine, PT, DPT  Acute Rehabilitation [phone] pager [phone]) [phone] office     Lurena Joiner B Adrin Julian 07/05/2018, 10:38 AM

## 2018-07-05 NOTE — Progress Notes (Signed)
Subjective: Patient reports still having weakness, left worse than right. States he thinks it has improved since yesterday.   Objective: Vital signs in last 24 hours: Temp:  [97.7 F (36.5 C)-98.5 F (36.9 C)] 97.7 F (36.5 C) (10/23 1131) Pulse Rate:  [41-93] 41 (10/23 1131) Resp:  [12-20] 14 (10/23 1131) BP: (107-158)/(70-92) 157/88 (10/23 1131) SpO2:  [96 %-100 %] 100 % (10/23 1131)  Intake/Output from previous day: 10/22 0701 - 10/23 0700 In: 1200 [P.O.:1200] Out: 1435 [Urine:1350; Drains:85] Intake/Output this shift: Total I/O In: 93.3 [I.V.:93.3] Out: 500 [Urine:500]   Lab Results: Lab Results  Component Value Date   WBC 10.4 07/05/2018   HGB 9.3 (L) 07/05/2018   HCT 30.4 (L) 07/05/2018   MCV 97.7 07/05/2018   PLT 76 (L) 07/05/2018   Lab Results  Component Value Date   INR 1.26 07/04/2018   BMET Lab Results  Component Value Date   NA 139 07/05/2018   K 4.7 07/05/2018   CL 106 07/05/2018   CO2 25 07/05/2018   GLUCOSE 227 (H) 07/05/2018   BUN 38 (H) 07/05/2018   CREATININE 0.83 07/05/2018   CALCIUM 9.0 07/05/2018    Studies/Results: No results found.  Assessment/Plan: PT planning to get up to chair today. No new nsgy recom   LOS: 28 days    [name] [name] 07/05/2018, 2:22 PM

## 2018-07-05 NOTE — Progress Notes (Signed)
Spoke with rounding MD about pts HR sustaining in 40s. Orders received for EKG and labs. Will follow-through with orders and continue to monitor.

## 2018-07-05 NOTE — Progress Notes (Signed)
Physical Therapy Treatment Patient Details Name: [name] [name] MRN: [id number] DOB: 1966/01/21 Today's Date: 07/05/2018    History of Present Illness 52 y.o. male admitted on 06/06/18 for bil LE wekness and muscle spasm after multiple falls (with resultant R knee meniscus tear placed in bledsoe brace) following a recent ACDF at C3 in July 2019 (complicated by PE (s/p IVC filter), CIR, and then SNF placement before finally returning home with his parents).  In the ED MRI of c-spine which showed post operative changes and cord edema and possibility of ischemia.  Pt also running low grade fevers without any clear source. His R knee was aspirated on 06/09/18.  Pt underwent posterior cervical decompression and fusion on 06/14/18.  Pt had to have re-exploration of posterior cervical wound for evacuation of epidural hematoma and placement of hemovac on 06/22/18.  Entire acute course complicated by hypotension in standing (thought to be autonomic) and by finding of multiple LE DVTs (already has an IVC filter in place, but per MDs notes at risk for blocking the filter with these clots).  Pt with other significant PMH of HTN, gout, anemia, DDD (lumbar).  Pt with post op issues with incisional bleeding s/p 2 evacuations of hematomas and plcement of JP drain (06/22/18 and 07/01/18) as well as multiple units of plasma.      PT Comments    Pt a bit somnolent today.  He was agreeable to participate, but did not see the need to practice sitting EOB.  I educated him on the importance of fully upright posture for BP regulation and to work his weak trunk and neck muscles.  We continue to lift him to the chair for OOB mobility as he is no longer strong enough to stand or even laterally scoot to the chair.  PT will continue to follow acutely for safe mobility progression  Follow Up Recommendations  [facility]     Equipment Recommendations  Wheelchair (measurements PT);Wheelchair cushion (measurements PT);Hospital  bed;Other (comment)(hoyer lift)    Recommendations for Other Services   NA     Precautions / Restrictions Precautions Precautions: Fall;Cervical Precaution Comments: Pt has JP drain Required Braces or Orthoses: Knee Immobilizer - Right;Cervical Brace Knee Immobilizer - Right: On when out of bed or walking Cervical Brace: Hard collar Other Brace/Splint: Pt has two Bledsoe braces, the one we have been using is the long leg brace that is locked in full extension.     Mobility  Bed Mobility Overal bed mobility: Needs Assistance Bed Mobility: Rolling Rolling: +2 for physical assistance;Max assist Sidelying to sit: +2 for physical assistance;Max assist     Sit to sidelying: +2 for physical assistance;Max assist General bed mobility comments: Two person max assist to roll to the right, three person near-total assit to go from side lying to sitting EOB.  Two person max assist to return to side lying and then to supine.  Pt is attempting to help and a bit anxious during transition up due to lack of ability to control his body and help much.    Transfers Overall transfer level: Needs assistance               General transfer comment: Maxi move OOB to recliner chair for some OOB/upright positioning time.   Ambulation/Gait             General Gait Details: Unable at this time.           Balance Overall balance assessment: Needs assistance Sitting-balance support: Bilateral upper extremity  supported;Feet supported Sitting balance-Leahy Scale: Poor Sitting balance - Comments: Mod assist to support trunk in sitting EOB.  Pt practiced weight shifting laterally and anteriorly/psoteriorly in sitting EOB, we worked on shoulder retraction and posterior shouler rolls as well as upright posture (head over shoulders).   Postural control: Posterior lean                                  Cognition Arousal/Alertness: Awake/alert Behavior During Therapy: WFL for tasks  assessed/performed Overall Cognitive Status: Within Functional Limits for tasks assessed                                           General Comments General comments (skin integrity, edema, etc.): HR stable througout in the 60s and 70s.  BPs supine and in sitting were 130s-140s/100s      Pertinent Vitals/Pain Pain Assessment: Faces Faces Pain Scale: Hurts whole lot Pain Location: posterior neck Pain Descriptors / Indicators: Sore Pain Intervention(s): Limited activity within patient's tolerance;Monitored during session;Repositioned           PT Goals (current goals can now be found in the care plan section) Acute Rehab PT Goals Patient Stated Goal: to walk again Progress towards PT goals: Progressing toward goals    Frequency    Min 3X/week      PT Plan Current plan remains appropriate       AM-PAC PT "6 Clicks" Daily Activity  Outcome Measure  Difficulty turning over in bed (including adjusting bedclothes, sheets and blankets)?: Unable Difficulty moving from lying on back to sitting on the side of the bed? : Unable Difficulty sitting down on and standing up from a chair with arms (e.g., wheelchair, bedside commode, etc,.)?: Unable Help needed moving to and from a bed to chair (including a wheelchair)?: Total Help needed walking in hospital room?: Total Help needed climbing 3-5 steps with a railing? : Total 6 Click Score: 6    End of Session Equipment Utilized During Treatment: Cervical collar Activity Tolerance: Patient tolerated treatment well Patient left: in chair;with call bell/phone within reach   PT Visit Diagnosis: Other abnormalities of gait and mobility (R26.89);Other symptoms and signs involving the nervous system (R29.898);Muscle weakness (generalized) (M62.81)     Time: 1530-1650 PT Time Calculation (min) (ACUTE ONLY): 80 min  Charges:  $Therapeutic Activity: 68-82 mins                    Claudina Oliphant B. Rumi Kolodziej, PT, DPT  Acute  Rehabilitation [phone] pager #[phone] office   07/05/2018, 9:49 PM

## 2018-07-05 NOTE — Progress Notes (Signed)
PROGRESS NOTE    [name] [name]  ZOX:[id number] DOB: 1966-05-21 DOA: 06/06/2018 PCP: Devra Dopp, MD    Brief Narrative: Brief Narrative:  52 year old man with past medical history relevant for gout, hypertension anterior cervical corpectomy of C4 on 03/24/2018 with comp gated postoperative course including development of multiple PEs and DVTs started on anticoagulation and subsequently developed hematoma requiring reexploration on 03/28/2018 status post placement of IVC filter who was discharged to skilled nursing facility and had a complicated course including progressive lower extremity weakness, intermittent fevers of unclear etiology and hospitalizations for "pneumonia". Patient was admitted to this hospital is a transfer from [facility] with MRI of C-spine that showed postoperative changes and cord compression with cord edema and concern for ischemia. Patient is status post posterior cervical decompression laminectomy of C3, C4, C5, removal of spinous processes, foraminotomies on 06/14/2018. On 06/22/2018 patient began to develop worsening left sided paresthesias and weakness and a stat MRI showed evidence of postoperative hematoma. Patient was taken to the OR with evacuation of hematoma. Additionally his postoperative course has been complicated by recurrent fevers,bilateral lower extremity DVTs on ultrasound,migratory arthralgias/arthritis that are thought to be rheumatologic in nature as well as significant orthostasis limiting the amount he can work with physical therapy.  Noted to have decreased left hand grip on 10/19, MRI showed large epidural hematoma, patient s/p clot evacuation.   Continues to have thrombocytopenia   Assessment & Plan:   Principal Problem:   Fever Active Problems:   Myelopathy (HCC)   Benign essential HTN   Cord compression (HCC)   History of pulmonary embolism   Spondylosis, cervical, with myelopathy   Labile blood pressure   Orthostatic  hypotension   Leukocytosis   Near syncope due to orthostatic hypotension Thought to be related to hypovolemia and autonomic dysfunction.  On midodrine and TED hose.  Echocardiogram July 2019 showed a normal systolic function, grade 2 diastolic dysfunction.  No significant valve abnormalities are present  Bradycardia;  Notice have lower HR today. Asymptomatic.  Hold midodrine.  Cardiology consulted, for both bradycardia, and syncope and orthostatic hypotension.   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   Polyarthralgia/persistent fever -Patient had symptoms in the shoulders and ankles, along with a history of gout and pain in his knees.  He underwent arthrocentesis however no evidence of infection was found. -He was noted to have elevated inflammatory markers including CCP, CRP.  However ANCA, aldolase, ANA were negative. -Previous rounding physician discussed with Dr. Deanne Coffer, rheumatologist, he will follow-up with the patient as an outpatient. -Patient was placed on prednisone however currently on dexamethasone (neurosurgery) Fever resolved./  Denies joint pain.   Thrombocytopenia/coagulopathy -Cardiolipin antibody negative -Lupus anticoagulant, homocysteine level, factor VIII within normal limits -Protein S activity 60 (low) -Protein C total >200; Activity 199 (high)  -HIT panel 0.486 (mildly elevated)- SRA (serotonin release assay) ordered and pending -immature platelet fraction WNL -Was given 2u platelets on 10/19 in the OR  Platelet stable.  Hematology  following.   Recent PE /DVT in setting of spine surgery -Status post IVC filter placement on March 28, 2018 -Patient was on apixaban however changed over  to IV heparin.  Anticoagulation however was discontinued for surgery.  Patient also noted to have thrombocytopenia. -Vascular DVT US 06/26/2018 showed bilateral lower extremity DVTs likely acute -case was discussed with  vascular surgery, Dr. Myra Gianotti, patient is out of the 3 week window for treatment, he would have to get TPA, which is contraindicated in a bleeding patient.  -now started on Argatroban.  Dr Pamelia Hoit will follow on patient, recommending argatroban, when patient stable and no further evidence of bleeding will need to start coumadin if patient has HIT.  Serotonin release assay pending.   Normocytic anemia Monitor hb.   Gout -Does not seem to be active at this time, continue Uloric   Mild acute renal failure; resolved.   HTN; amlodipine discontinue due to orthostatic hypotension and edema.   Gout;  Continue with uloric.   Anemia;  was given 1u PRBC on 10/19 in OR Hb stable on argatroban.   DVT prophylaxis: argatroban.  Code Status: full code.  Family Communication: care discussed with patient,  Disposition Plan: remain in the hospital for treatment of Blood clot, monitor for development of hematoma.     Consultants:   Neurosurgery  Hematology  Cardiology    Procedures: On 10/2 1. posterior cervical decompressive laminectomy at C3, C4, C5 with removal of the spinous process and complete laminectomy and foraminotomies of the C3, C4, C5 nerve roots. 2. Posterior cervical fusion with lateral mass screws at C3-C4-C5 and pars screws at C2 lies in the globus ellipseLateral mass screw system 3. Posterior lateral arthrodesis C2-C5 utilizing locally harvested autograft mixed with vivigen  06/22/2018: Procedure reexploration posterior cervical wound for evacuation of epidural hematoma  06/26/2018 DVT  ultrasound:Right: Findings consistent with acute deep vein thrombosis involving the right common femoral vein, right femoral vein, right popliteal vein, right posterior tibial vein, and right peroneal vein. Thrombosis extends proximally into external iliac vein.  Proximal iliac vein not visualized. Left: Findings consistent with acute deep vein thrombosis involving the left common femoral vein, left femoral vein, left popliteal vein, left posterior tibial vein, and left peroneal vein. Findings consistent with acute superficial vein thrombosis  involving the left great saphenous vein. Thrombosis extends proximally into external iliac vein. Proximal iliac vein not visualized.  07/01/18 Clot evacuation   RUE doppler 07/02/18    Antimicrobials:    Subjective: He denies chest pain, dyspnea, lightheaded.  He is concern and frustrated with his current medical condition  And complexity of his condition.    Objective: Vitals:   07/04/18 2000 07/04/18 2300 07/05/18 0248 07/05/18 0723  BP: 107/70  (!) 144/84 (!) 158/92  Pulse: 93  (!) 52 64  Resp: 17  12 20   Temp:  98 F (36.7 C) 97.7 F (36.5 C) 97.8 F (36.6 C)  TempSrc:  Oral Oral Oral  SpO2: 96%  97% 100%  Weight:      Height:        Intake/Output Summary (Last 24 hours) at 07/05/2018 1038 Last data filed at 07/05/2018 0800 Gross per 24 hour  Intake 573.26 ml  Output 1205 ml  Net -631.74 ml   Filed Weights   06/06/18 2246 06/14/18 1151 06/22/18 1641  Weight: 123.8 kg 123.8 kg 123.8 kg    Examination:  General exam: Appears calm and comfortable  Respiratory system: Clear to auscultation. Respiratory effort normal. Cardiovascular system: S1 & S2 heard, RRR. No JVD, murmurs, rubs, gallops or clicks. No pedal edema. Gastrointestinal system: Abdomen is nondistended, soft and nontender. No organomegaly or masses  felt. Normal bowel sounds heard. Central nervous system: Alert and oriented. Left lower extremity weakness, more  than right  Extremities: edema,  Skin: No rashes, lesions or ulcers Psychiatry: Judgement and insight appear normal. Mood & affect appropriate.     Data Reviewed: I have personally reviewed following labs and imaging studies  CBC: Recent Labs  Lab 06/29/18 0646 06/30/18 0500 07/01/18 0606 07/02/18 0612 07/03/18 0717 07/04/18 0433 07/05/18 0513  WBC 12.8* 11.6* 10.6* 10.5 11.5* 12.6* 10.4  NEUTROABS 10.8* 10.0* 8.9* 9.5*  --   --   --   HGB 9.3* 8.7* 8.7* 9.4* 9.4* 9.0* 9.3*  HCT 31.3* 28.7* 28.9* 29.0* 29.5* 29.5* 30.4*  MCV 96.3 96.6 95.4 94.2 97.0 96.7 97.7  PLT 65* 56* 55* 76* 75* 70* 76*   Basic Metabolic Panel: Recent Labs  Lab 07/01/18 0606 07/02/18 0612 07/03/18 0717 07/04/18 0433 07/05/18 0513  NA 137 136 138 139 139  K 4.4 4.5 4.8 4.5 4.7  CL 104 100 104 105 106  CO2 27 26 24 27 25   GLUCOSE 168* 199* 236* 180* 227*  BUN 30* 31* 35* 32* 38*  CREATININE 0.89 0.80 1.30* 0.78 0.83  CALCIUM 8.8* 8.9 8.8* 9.1 9.0   GFR: Estimated Creatinine Clearance: 135.3 mL/min (by C-G formula based on SCr of 0.83 mg/dL). Liver Function Tests: No results for input(s): AST, ALT, ALKPHOS, BILITOT, PROT, ALBUMIN in the last 168 hours. No results for input(s): LIPASE, AMYLASE in the last 168 hours. No results for input(s): AMMONIA in the last 168 hours. Coagulation Profile: Recent Labs  Lab 06/30/18 1059 07/01/18 0854 07/02/18 1027 07/04/18 0815  INR 1.33 1.20 1.22 1.26   Cardiac Enzymes: No results for input(s): CKTOTAL, CKMB, CKMBINDEX, TROPONINI in the last 168 hours. BNP (last 3 results) No results for input(s): PROBNP in the last 8760 hours. HbA1C: No results for input(s): HGBA1C in the last 72 hours. CBG: Recent Labs  Lab 07/04/18 0739 07/04/18 1136 07/04/18 1555 07/04/18 2239 07/05/18 0725  GLUCAP 171* 207* 167* 278* 241*   Lipid Profile: No results for input(s): CHOL, HDL, LDLCALC, TRIG, CHOLHDL, LDLDIRECT in the last 72 hours. Thyroid Function  Tests: No results for input(s): TSH, T4TOTAL, FREET4, T3FREE, THYROIDAB in the last 72 hours. Anemia Panel: No results for input(s): VITAMINB12, FOLATE, FERRITIN, TIBC, IRON, RETICCTPCT in the last 72 hours. Sepsis Labs: No results for input(s): PROCALCITON, LATICACIDVEN in the last 168 hours.  No results found for this or any previous visit (from the past 240 hour(s)).       Radiology Studies: No results found.      Scheduled Meds: . sodium chloride   Intravenous Once  . sodium chloride   Intravenous Once  . dexamethasone  8 mg Intravenous Q6H  . docusate sodium  100 mg Oral Daily  . doxycycline  100 mg Oral Q12H  . febuxostat  40 mg Oral Daily  . gabapentin  300 mg Oral TID  . Influenza vac split quadrivalent PF  0.5 mL Intramuscular Tomorrow-1000  . insulin aspart  0-5 Units Subcutaneous QHS  . insulin aspart  0-9 Units Subcutaneous TID WC  . methocarbamol  750 mg Oral QID  . midodrine  5 mg Oral TID WC  . nystatin  5 mL Oral TID AC & HS  . pantoprazole  40 mg Oral Daily  . polyethylene glycol  17 g Oral BID  . senna-docusate  1 tablet Oral QHS   Continuous Infusions: . sodium chloride    .  argatroban 1.3 mcg/kg/min (07/05/18 1022)  . lactated ringers 10 mL/hr at 07/02/18 0320     LOS: 28 days    Time spent: 35 minutes.     Alba Cory, MD [facility] Pager [phone]  If 7PM-7AM, please contact night-coverage [email or web address] Password [facility] 07/05/2018, 10:38 AM

## 2018-07-05 NOTE — Progress Notes (Signed)
ANTICOAGULATION CONSULT NOTE  Pharmacy Consult for Argatroban Indication: DVT - Thromboembolic Disease  Allergies  Allergen Reactions  . Nsaids Other (See Comments)    G6PD DEFICIENCY  . Sulfa Antibiotics Other (See Comments)    G6PD DEFICIENCY  . Protonix [Pantoprazole Sodium] Diarrhea    Severe diarrhea, Upset stomach    Patient Measurements: Height: 5\' 9"  (175.3 cm) Weight: 272 lb 14.9 oz (123.8 kg) IBW/kg (Calculated) : 70.7  Vital Signs: Temp: 98 F (36.7 C) (10/22 2300) Temp Source: Oral (10/22 2300) BP: 107/70 (10/22 2000) Pulse Rate: 93 (10/22 2000)  Labs: Recent Labs    07/02/18 0612 07/02/18 1027 07/03/18 0717 07/04/18 0433 07/04/18 0815 07/04/18 2327  HGB 9.4*  --  9.4* 9.0*  --   --   HCT 29.0*  --  29.5* 29.5*  --   --   PLT 76*  --  75* 70*  --   --   APTT 24  --  25 23*  --  44*  LABPROT  --  15.3*  --   --  15.6*  --   INR  --  1.22  --   --  1.26  --   CREATININE 0.80  --  1.30* 0.78  --   --     Estimated Creatinine Clearance: 140.4 mL/min (by C-G formula based on SCr of 0.78 mg/dL).   Medical History: Past Medical History:  Diagnosis Date  . Degenerative disc disease, lumbar   . Gout   . History of pernicious anemia 1980   Patient says he takes OTC iron supplements QD  . Hypertension     Medications:  Scheduled:  . sodium chloride   Intravenous Once  . sodium chloride   Intravenous Once  . dexamethasone  8 mg Intravenous Q6H  . docusate sodium  100 mg Oral Daily  . doxycycline  100 mg Oral Q12H  . febuxostat  40 mg Oral Daily  . gabapentin  300 mg Oral TID  . Influenza vac split quadrivalent PF  0.5 mL Intramuscular Tomorrow-1000  . insulin aspart  0-5 Units Subcutaneous QHS  . insulin aspart  0-9 Units Subcutaneous TID WC  . methocarbamol  750 mg Oral QID  . midodrine  5 mg Oral TID WC  . nystatin  5 mL Oral TID AC & HS  . pantoprazole  40 mg Oral Daily  . polyethylene glycol  17 g Oral BID  . senna-docusate  1 tablet Oral  QHS    Assessment: 51 y.o. male  s/p cervical fusion 10/2 and epidural hematoma evacuation 10/10, now with bilateral DVTs and thrombocytopenia.  Pharmacy asked to initiate argatroban therapy  10/23 AM update: aPTT low at 44, no issues per RN.   Goal of Therapy:  aPTT 50-90 seconds Monitor platelets by anticoagulation protocol: Yes   Plan:  Inc argatroban to 1.3 mcg/kg/min APTT in 2 hours  Abran Duke, PharmD, BCPS Clinical Pharmacist Phone: [phone]

## 2018-07-05 NOTE — Progress Notes (Signed)
ANTICOAGULATION CONSULT NOTE - Initial Consult  Pharmacy Consult for Bivalirudin Indication: h/o VTE's  Allergies  Allergen Reactions  . Nsaids Other (See Comments)    G6PD DEFICIENCY  . Sulfa Antibiotics Other (See Comments)    G6PD DEFICIENCY  . Protonix [Pantoprazole Sodium] Diarrhea    Severe diarrhea, Upset stomach    Patient Measurements: Height: 5\' 9"  (175.3 cm) Weight: 272 lb 14.9 oz (123.8 kg) IBW/kg (Calculated) : 70.7  Vital Signs: Temp: 97.7 F (36.5 C) (10/23 1131) Temp Source: Oral (10/23 1131) BP: 157/88 (10/23 1131) Pulse Rate: 41 (10/23 1131)  Labs: Recent Labs    07/03/18 0717 07/04/18 0433 07/04/18 0815 07/04/18 2327 07/05/18 0513 07/05/18 0923 07/05/18 1111  HGB 9.4* 9.0*  --   --  9.3*  --   --   HCT 29.5* 29.5*  --   --  30.4*  --   --   PLT 75* 70*  --   --  76*  --   --   APTT 25 23*  --  44* 50* 51*  --   LABPROT  --   --  15.6*  --   --   --   --   INR  --   --  1.26  --   --   --   --   CREATININE 1.30* 0.78  --   --  0.83  --   --   TROPONINI  --   --   --   --   --   --  <0.03    Estimated Creatinine Clearance: 135.3 mL/min (by C-G formula based on SCr of 0.83 mg/dL).   Medical History: Past Medical History:  Diagnosis Date  . Degenerative disc disease, lumbar   . Gout   . History of pernicious anemia 1980   Patient says he takes OTC iron supplements QD  . Hypertension      Assessment: Anticoag: Eliquis PTA for hx PE and DVTs, has IVC filter. 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 - PM: Argatroban>>Bival due to  Argatroban has a risk of bradycardia and hypotension, likely due to vasodilation effects.   Goal of Therapy:  aPTT 50-85s seconds Monitor platelets by anticoagulation protocol: Yes   Plan:  Bivalirudin 0.15mg /kg/hr 2000 aPTT- 2 hrs after start Daily aPTT and CBC If SRA negative, can change to [employer]. Merilynn Finland, PharmD, BCPS Clinical Staff Pharmacist Misty Stanley Stillinger 07/05/2018,4:58  PM

## 2018-07-05 NOTE — Progress Notes (Signed)
ANTICOAGULATION CONSULT NOTE  Pharmacy Consult for Bivalirudin  Indication: DVT - Thromboembolic Disease  Allergies  Allergen Reactions  . Nsaids Other (See Comments)    G6PD DEFICIENCY  . Sulfa Antibiotics Other (See Comments)    G6PD DEFICIENCY  . Protonix [Pantoprazole Sodium] Diarrhea    Severe diarrhea, Upset stomach    Patient Measurements: Height: 5\' 9"  (175.3 cm) Weight: 272 lb 14.9 oz (123.8 kg) IBW/kg (Calculated) : 70.7  Vital Signs: Temp: 98.1 F (36.7 C) (10/23 2332) Temp Source: Oral (10/23 2332) BP: 125/95 (10/23 2332) Pulse Rate: 71 (10/23 2332)  Labs: Recent Labs    07/03/18 0717 07/04/18 0433 07/04/18 0815  07/05/18 0513 07/05/18 0923 07/05/18 1111 07/05/18 1612 07/05/18 2219  HGB 9.4* 9.0*  --   --  9.3*  --   --   --   --   HCT 29.5* 29.5*  --   --  30.4*  --   --   --   --   PLT 75* 70*  --   --  76*  --   --   --   --   APTT 25 23*  --    < > 50* 51*  --   --  60*  LABPROT  --   --  15.6*  --   --   --   --   --   --   INR  --   --  1.26  --   --   --   --   --   --   CREATININE 1.30* 0.78  --   --  0.83  --   --   --   --   TROPONINI  --   --   --   --   --   --  <0.03 <0.03 <0.03   < > = values in this interval not displayed.    Estimated Creatinine Clearance: 135.3 mL/min (by C-G formula based on SCr of 0.83 mg/dL).   Medical History: Past Medical History:  Diagnosis Date  . Degenerative disc disease, lumbar   . Gout   . History of pernicious anemia 1980   Patient says he takes OTC iron supplements QD  . Hypertension     Medications:  Scheduled:  . sodium chloride   Intravenous Once  . sodium chloride   Intravenous Once  . dexamethasone  8 mg Intravenous Q6H  . docusate sodium  100 mg Oral Daily  . doxycycline  100 mg Oral Q12H  . febuxostat  40 mg Oral Daily  . gabapentin  300 mg Oral TID  . Influenza vac split quadrivalent PF  0.5 mL Intramuscular Tomorrow-1000  . insulin aspart  0-5 Units Subcutaneous QHS  . insulin  aspart  0-9 Units Subcutaneous TID WC  . methocarbamol  750 mg Oral QID  . midodrine  5 mg Oral TID WC  . nystatin  5 mL Oral TID AC & HS  . pantoprazole  40 mg Oral Daily  . polyethylene glycol  17 g Oral BID  . senna-docusate  1 tablet Oral QHS    Assessment: 52 y.o. male  s/p cervical fusion 10/2 and epidural hematoma evacuation 10/10, now with bilateral DVTs and thrombocytopenia.  Pharmacy asked to initiate argatroban therapy  10/23 PM update: argatroban has been switched to bivalirudin, initial aPTT on bival is therapeutic   Goal of Therapy:  APTT 50-85 secs Monitor platelets by anticoagulation protocol: Yes   Plan:  Continue Bivalirudin 0.15 mg/kg/hr  Confirmatory aPTT in a few hours with AM labs  Abran Duke, PharmD, BCPS Clinical Pharmacist Phone: [phone]

## 2018-07-05 NOTE — Progress Notes (Signed)
Inpatient Diabetes Program Recommendations  AACE/ADA: New Consensus Statement on Inpatient Glycemic Control (2015)  Target Ranges:  Prepandial:   less than 140 mg/dL      Peak postprandial:   less than 180 mg/dL (1-2 hours)      Critically ill patients:  140 - 180 mg/dL   Lab Results  Component Value Date   GLUCAP 241 (H) 07/05/2018    Review of Glycemic ControlResults for [name] [name], [name] [name] (MRN [id number]) as of 07/05/2018 11:19  Ref. Range 07/04/2018 07:39 07/04/2018 11:36 07/04/2018 15:55 07/04/2018 22:39 07/05/2018 07:25  Glucose-Capillary Latest Ref Range: 70 - 99 mg/dL 086 (H) 578 (H) 469 (H) 278 (H) 241 (H)   Diabetes history: None Outpatient Diabetes medications: None Current orders for Inpatient glycemic control:  Novolog sensitive tid with meals and HS, Decadron 8 mg IV q 6 hours Inpatient Diabetes Program Recommendations:    May consider adding Levemir 10 units q AM while on steroids.  Thanks,  Beryl Meager, RN, BC-ADM Inpatient Diabetes Coordinator Pager [phone] (8a-5p)

## 2018-07-05 NOTE — Progress Notes (Signed)
ANTICOAGULATION CONSULT NOTE   Pharmacy Consult for Argatroban Indication: DVT   Patient Measurements: Height: 5\' 9"  (175.3 cm) Weight: 272 lb 14.9 oz (123.8 kg) IBW/kg (Calculated) : 70.7  Vital Signs: Temp: 97.8 F (36.6 C) (10/23 0723) Temp Source: Oral (10/23 0723) BP: 158/92 (10/23 0723) Pulse Rate: 64 (10/23 0723)  Labs: Recent Labs    07/03/18 0717 07/04/18 0433 07/04/18 0815 07/04/18 2327 07/05/18 0513 07/05/18 0923  HGB 9.4* 9.0*  --   --  9.3*  --   HCT 29.5* 29.5*  --   --  30.4*  --   PLT 75* 70*  --   --  76*  --   APTT 25 23*  --  44* 50* 51*  LABPROT  --   --  15.6*  --   --   --   INR  --   --  1.26  --   --   --   CREATININE 1.30* 0.78  --   --  0.83  --     Medical History: Past Medical History:  Diagnosis Date  . Degenerative disc disease, lumbar   . Gout   . History of pernicious anemia 1980   Patient says he takes OTC iron supplements QD  . Hypertension     Medications:  Scheduled:  . sodium chloride   Intravenous Once  . sodium chloride   Intravenous Once  . dexamethasone  8 mg Intravenous Q6H  . docusate sodium  100 mg Oral Daily  . doxycycline  100 mg Oral Q12H  . febuxostat  40 mg Oral Daily  . gabapentin  300 mg Oral TID  . Influenza vac split quadrivalent PF  0.5 mL Intramuscular Tomorrow-1000  . insulin aspart  0-5 Units Subcutaneous QHS  . insulin aspart  0-9 Units Subcutaneous TID WC  . methocarbamol  750 mg Oral QID  . midodrine  5 mg Oral TID WC  . nystatin  5 mL Oral TID AC & HS  . pantoprazole  40 mg Oral Daily  . polyethylene glycol  17 g Oral BID  . senna-docusate  1 tablet Oral QHS    Assessment: 52 y.o. male s/p cervical fusion 10/2 and epidural hematoma evacuation 10/10, now with bilateral DVTs and thrombocytopenia. Argatroban infusion as recommended by heme/onc. Patient has had issues with bleeding and thrombocytopenia throughout admission. aPTT is therapeutic on low end of therapeutic range. hgb 9.3, plts 76.     Goal of Therapy:  aPTT 50-90 seconds Monitor platelets by anticoagulation protocol: Yes   Plan:  Argatroban 1 mcg/kg/min Daily aPTT Check aPTT again in 12 hours   Baldemar Friday  07/05/2018 11:28 AM

## 2018-07-05 NOTE — Social Work (Signed)
CSW continuing to follow for support with disposition when medically appropriate.  Meril Dray, MSW, LCSWA [place] Clinical Social Work [phone]   

## 2018-07-06 ENCOUNTER — Inpatient Hospital Stay: Payer: Self-pay

## 2018-07-06 LAB — TYPE AND SCREEN
ABO/RH(D): O POS
Antibody Screen: NEGATIVE
UNIT DIVISION: 0
UNIT DIVISION: 0

## 2018-07-06 LAB — CBC
HCT: 30.5 % — ABNORMAL LOW (ref 39.0–52.0)
Hemoglobin: 9.6 g/dL — ABNORMAL LOW (ref 13.0–17.0)
MCH: 30.5 pg (ref 26.0–34.0)
MCHC: 31.5 g/dL (ref 30.0–36.0)
MCV: 96.8 fL (ref 80.0–100.0)
PLATELETS: 72 10*3/uL — AB (ref 150–400)
RBC: 3.15 MIL/uL — ABNORMAL LOW (ref 4.22–5.81)
RDW: 16.7 % — AB (ref 11.5–15.5)
WBC: 8 10*3/uL (ref 4.0–10.5)
nRBC: 0 % (ref 0.0–0.2)

## 2018-07-06 LAB — BPAM RBC
BLOOD PRODUCT EXPIRATION DATE: 201911182359
BLOOD PRODUCT EXPIRATION DATE: 201911182359
ISSUE DATE / TIME: 201910200052
ISSUE DATE / TIME: 201910200108
UNIT TYPE AND RH: 5100
Unit Type and Rh: 5100

## 2018-07-06 LAB — APTT: aPTT: 58 seconds — ABNORMAL HIGH (ref 24–36)

## 2018-07-06 LAB — GLUCOSE, CAPILLARY
Glucose-Capillary: 212 mg/dL — ABNORMAL HIGH (ref 70–99)
Glucose-Capillary: 180 mg/dL — ABNORMAL HIGH (ref 70–99)
Glucose-Capillary: 170 mg/dL — ABNORMAL HIGH (ref 70–99)
Glucose-Capillary: 244 mg/dL — ABNORMAL HIGH (ref 70–99)

## 2018-07-06 MED ORDER — CEFAZOLIN SODIUM-DEXTROSE 2-4 GM/100ML-% IV SOLN
2.0000 g | Freq: Three times a day (TID) | INTRAVENOUS | Status: DC
  Administered 2018-07-06 – 2018-07-12 (×19): 2 g via INTRAVENOUS
  Filled 2018-07-06 (×21): qty 100

## 2018-07-06 MED ORDER — SODIUM CHLORIDE 0.9% FLUSH
10.0000 mL | Freq: Two times a day (BID) | INTRAVENOUS | Status: DC
  Administered 2018-07-06 – 2018-07-25 (×33): 10 mL
  Administered 2018-07-26: 20 mL
  Administered 2018-07-26 – 2018-08-02 (×13): 10 mL
  Administered 2018-08-03: 40 mL
  Administered 2018-08-03 – 2018-08-17 (×21): 10 mL
  Administered 2018-08-17: 20 mL
  Administered 2018-08-18 – 2018-09-10 (×43): 10 mL

## 2018-07-06 MED ORDER — SODIUM CHLORIDE 0.9% FLUSH
10.0000 mL | INTRAVENOUS | Status: DC | PRN
  Administered 2018-08-14 – 2018-09-01 (×2): 10 mL
  Filled 2018-07-06 (×2): qty 40

## 2018-07-06 NOTE — Progress Notes (Signed)
PROGRESS NOTE    [name] [name]  XBJ:[id number] DOB: 1965/10/23 DOA: 06/06/2018 PCP: Devra Dopp, MD    Brief Narrative: Brief Narrative:  52 year old man with past medical history relevant for gout, hypertension anterior cervical corpectomy of C4 on 03/24/2018 with comp gated postoperative course including development of multiple PEs and DVTs started on anticoagulation and subsequently developed hematoma requiring reexploration on 03/28/2018 status post placement of IVC filter who was discharged to skilled nursing facility and had a complicated course including progressive lower extremity weakness, intermittent fevers of unclear etiology and hospitalizations for "pneumonia". Patient was admitted to this hospital is a transfer from [facility] with MRI of C-spine that showed postoperative changes and cord compression with cord edema and concern for ischemia. Patient is status post posterior cervical decompression laminectomy of C3, C4, C5, removal of spinous processes, foraminotomies on 06/14/2018. On 06/22/2018 patient began to develop worsening left sided paresthesias and weakness and a stat MRI showed evidence of postoperative hematoma. Patient was taken to the OR with evacuation of hematoma. Additionally his postoperative course has been complicated by recurrent fevers,bilateral lower extremity DVTs on ultrasound,migratory arthralgias/arthritis that are thought to be rheumatologic in nature as well as significant orthostasis limiting the amount he can work with physical therapy.  Noted to have decreased left hand grip on 10/19, MRI showed large epidural hematoma, patient s/p clot evacuation. Continues to have thrombocytopenia   Assessment & Plan:   Principal Problem:   Fever Active Problems:   Myelopathy (HCC)   Benign essential HTN   Cord compression (HCC)   History of pulmonary embolism   Spondylosis, cervical, with myelopathy   Labile blood pressure   Orthostatic  hypotension   Leukocytosis   Near syncope due to orthostatic hypotension Thought to be related to hypovolemia and autonomic dysfunction.  On midodrine and TED hose.  Echocardiogram July 2019 showed a normal systolic function, grade 2 diastolic dysfunction.  No significant valve abnormalities are present Evaluated by cardiology. Continue with midodrine. Abdominal binder ordered.   Bradycardia;  Notice have lower HR 10-23. Asymptomatic.  Cardiology consulted, for both bradycardia, and syncope and orthostatic hypotension.  No indication for pacer. Thought argatroban might be causing bradycardia.    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 -On IV decadron.   Polyarthralgia/persistent fever -Patient had symptoms in the shoulders and ankles, along with a history of gout and pain in his knees.  He underwent arthrocentesis however no evidence of infection was found. -He was noted to have elevated inflammatory markers including CCP, CRP.  However ANCA, aldolase, ANA were negative. -Previous rounding physician discussed with Dr. Deanne Coffer, rheumatologist, he will follow-up with the patient as an outpatient. -Patient was placed on prednisone however currently on dexamethasone (neurosurgery) Fever resolved./  Denies joint pain.   Thrombocytopenia/coagulopathy -Cardiolipin antibody negative -Lupus anticoagulant, homocysteine level, factor VIII within normal limits -Protein S activity 60 (low) -Protein C total >200; Activity 199 (high)  -HIT panel 0.486 (mildly elevated)- SRA (serotonin release  assay) ordered and pending -immature platelet fraction WNL -Was given 2u platelets on 10/19 in the OR  Platelet stable.  Hematology following.   Recent PE /DVT in setting of spine  surgery -Status post IVC filter placement on March 28, 2018 -Patient was on apixaban however changed over to IV heparin.  Anticoagulation however was discontinued for surgery.  Patient also noted to have thrombocytopenia. -Vascular DVT US 06/26/2018 showed bilateral lower extremity DVTs likely acute -Case was discussed with  vascular surgery, Dr. Myra Gianotti, patient is out of the 3 week window for treatment, he would have to get TPA, which is contraindicated in a bleeding patient.  -now started on Bivalirudin Serotonin release assay negative.  Neurosurgery will discussed with hematology, I ask PA from neurosurgery to speak with Dr Pamelia Hoit regarding when to transition to oral anticoagulation, and when to remove the spinal drainage.    Normocytic anemia Monitor hb.  Stable.   Gout -Does not seem to be active at this time, continue Uloric   Mild acute renal failure; resolved.   HTN; amlodipine discontinue due to orthostatic hypotension and edema.   Gout;  Continue with uloric.   Anemia;  was given 1u PRBC on 10/19 in OR Hb stable  DVT prophylaxis: argatroban.  Code Status: full code.  Family Communication: care discussed with patient,  Disposition Plan: remain in the hospital for treatment of Blood clot, monitor for development of hematoma.     Consultants:   Neurosurgery  Hematology  Cardiology    Procedures: On 10/2 1. posterior cervical decompressive laminectomy at C3, C4, C5 with removal of the spinous process and complete laminectomy and foraminotomies of the C3, C4, C5 nerve roots. 2. Posterior cervical fusion with lateral mass screws at C3-C4-C5 and pars screws at C2 lies in the globus ellipseLateral mass screw system 3. Posterior lateral arthrodesis C2-C5 utilizing locally harvested  autograft mixed with vivigen  06/22/2018: Procedure reexploration posterior cervical wound for evacuation of epidural hematoma  06/26/2018 DVT ultrasound:Right: Findings consistent with acute deep vein thrombosis involving the right common femoral vein, right femoral vein, right popliteal vein, right posterior tibial vein, and right peroneal vein. Thrombosis extends proximally into external iliac vein.  Proximal iliac vein not visualized. Left: Findings consistent with acute deep vein thrombosis involving the left common femoral vein, left femoral vein, left popliteal vein, left posterior tibial vein, and left peroneal vein. Findings consistent with acute superficial vein thrombosis  involving the left great saphenous vein. Thrombosis extends proximally into external iliac vein. Proximal iliac vein not visualized.  07/01/18 Clot evacuation   RUE doppler 07/02/18    Antimicrobials:    Subjective: He denies dyspnea. He has had Bowel movement.  He relates he doesn't have problem with moving right side. Left side still weak.    Objective: Vitals:   07/05/18 1659 07/05/18 1900 07/05/18 2332 07/06/18 0721  BP: (!) 144/94 127/86 (!) 125/95 (!) 146/96  Pulse: 72 74 71 61  Resp: 14 17 18 15   Temp: 98.3 F (36.8 C) 98.2 F (36.8 C) 98.1 F (36.7 C) 97.7 F (36.5 C)  TempSrc: Oral Oral Oral Oral  SpO2: 98% 98% 98% 99%  Weight:      Height:        Intake/Output Summary (Last 24 hours) at 07/06/2018 1238 Last data filed at 07/06/2018 0800 Gross per 24 hour  Intake 800.5 ml  Output 1040 ml  Net -239.5 ml   Filed Weights   06/06/18 2246 06/14/18 1151 06/22/18 1641  Weight: 123.8 kg 123.8 kg 123.8 kg    Examination:  General exam: NAD Respiratory system: CTA Cardiovascular system: S 1, S 2 RRR Gastrointestinal system: BS present, soft, nt Central nervous system:  alert and oriented. Left side weakness Extremities: edema LE  Skin: No rashes.  Psychiatry: Mood and  affect appropriate     Data Reviewed: I have personally reviewed following labs and imaging studies  CBC: Recent Labs  Lab 06/30/18 0500 07/01/18 0606 07/02/18 0612 07/03/18 0717 07/04/18 0433 07/05/18 0513 07/06/18 0411  WBC 11.6* 10.6* 10.5 11.5* 12.6* 10.4 8.0  NEUTROABS 10.0* 8.9* 9.5*  --   --   --   --   HGB 8.7* 8.7* 9.4* 9.4* 9.0* 9.3* 9.6*  HCT 28.7* 28.9* 29.0* 29.5* 29.5* 30.4* 30.5*  MCV 96.6 95.4 94.2 97.0 96.7 97.7 96.8  PLT 56* 55* 76* 75* 70* 76* 72*   Basic Metabolic Panel: Recent Labs  Lab 07/01/18 0606 07/02/18 0612 07/03/18 0717 07/04/18 0433 07/05/18 0513  NA 137 136 138 139 139  K 4.4 4.5 4.8 4.5 4.7  CL 104 100 104 105 106  CO2 27 26 24 27 25   GLUCOSE 168* 199* 236* 180* 227*  BUN 30* 31* 35* 32* 38*  CREATININE 0.89 0.80 1.30* 0.78 0.83  CALCIUM 8.8* 8.9 8.8* 9.1 9.0  MG  --   --   --   --  2.1   GFR: Estimated Creatinine Clearance: 135.3 mL/min (by C-G formula based on SCr of 0.83 mg/dL). Liver Function Tests: No results for input(s): AST, ALT, ALKPHOS, BILITOT, PROT, ALBUMIN in the last 168 hours. No results for input(s): LIPASE, AMYLASE in the last 168 hours. No results for input(s): AMMONIA in the last 168 hours. Coagulation Profile: Recent Labs  Lab 06/30/18 1059 07/01/18 0854 07/02/18 1027 07/04/18 0815  INR 1.33 1.20 1.22 1.26   Cardiac Enzymes: Recent Labs  Lab 07/05/18 1111 07/05/18 1612 07/05/18 2219  TROPONINI <0.03 <0.03 <0.03   BNP (last 3 results) No results for input(s): PROBNP in the last 8760 hours. HbA1C: No results for input(s): HGBA1C in the last 72 hours. CBG: Recent Labs  Lab 07/05/18 0725 07/05/18 1133 07/05/18 1703 07/05/18 2203 07/06/18 0725  GLUCAP 241* 196* 171* 251* 212*   Lipid Profile: No results for input(s): CHOL, HDL, LDLCALC, TRIG, CHOLHDL, LDLDIRECT in the last 72 hours. Thyroid Function Tests: No results for input(s): TSH, T4TOTAL, FREET4, T3FREE, THYROIDAB in the last 72  hours. Anemia Panel: No results for input(s): VITAMINB12, FOLATE, FERRITIN, TIBC, IRON, RETICCTPCT in the last 72 hours. Sepsis Labs: No results for input(s): PROCALCITON, LATICACIDVEN in the last 168 hours.  No results found for this or any previous visit (from the past 240 hour(s)).       Radiology Studies: [place] Ekg Site Rite  Result Date: 07/06/2018 If [facility] image not attached, placement could not be confirmed due to current cardiac rhythm.       Scheduled Meds: . dexamethasone  8 mg Intravenous Q6H  . docusate sodium  100 mg Oral Daily  . doxycycline  100 mg Oral Q12H  . febuxostat  40 mg Oral Daily  . gabapentin  300 mg Oral TID  . Influenza vac split quadrivalent PF  0.5 mL Intramuscular Tomorrow-1000  . insulin aspart  0-5 Units Subcutaneous QHS  . insulin aspart  0-9 Units Subcutaneous TID WC  . methocarbamol  750 mg Oral QID  . midodrine  5 mg Oral TID WC  . nystatin  5 mL Oral TID AC & HS  . pantoprazole  40 mg Oral Daily  . polyethylene glycol  17 g Oral BID  . senna-docusate  1 tablet Oral QHS  . sodium  chloride flush  10-40 mL Intracatheter Q12H   Continuous Infusions: . bivalirudin (ANGIOMAX) infusion 0.5 mg/mL (Non-ACS indications) 0.15 mg/kg/hr (07/06/18 0800)  .  ceFAZolin (ANCEF) IV 2 g (07/06/18 0929)  . lactated ringers 10 mL/hr at 07/02/18 0320     LOS: 29 days    Time spent: 35 minutes.     Alba Cory, MD [facility] Pager [phone]  If 7PM-7AM, please contact night-coverage [email or web address] Password [facility] 07/06/2018, 12:38 PM

## 2018-07-06 NOTE — Progress Notes (Signed)
ANTICOAGULATION CONSULT NOTE  Pharmacy Consult for Bivalirudin  Indication: DVT - Thromboembolic Disease  Allergies  Allergen Reactions  . Nsaids Other (See Comments)    G6PD DEFICIENCY  . Sulfa Antibiotics Other (See Comments)    G6PD DEFICIENCY  . Protonix [Pantoprazole Sodium] Diarrhea    Severe diarrhea, Upset stomach    Patient Measurements: Height: 5\' 9"  (175.3 cm) Weight: 272 lb 14.9 oz (123.8 kg) IBW/kg (Calculated) : 70.7  Vital Signs: Temp: 98.1 F (36.7 C) (10/23 2332) Temp Source: Oral (10/23 2332) BP: 125/95 (10/23 2332) Pulse Rate: 71 (10/23 2332)  Labs: Recent Labs    07/03/18 0717 07/04/18 0433 07/04/18 0815  07/05/18 0513 07/05/18 0923 07/05/18 1111 07/05/18 1612 07/05/18 2219 07/06/18 0411  HGB 9.4* 9.0*  --   --  9.3*  --   --   --   --  9.6*  HCT 29.5* 29.5*  --   --  30.4*  --   --   --   --  30.5*  PLT 75* 70*  --   --  76*  --   --   --   --  72*  APTT 25 23*  --    < > 50* 51*  --   --  60* 58*  LABPROT  --   --  15.6*  --   --   --   --   --   --   --   INR  --   --  1.26  --   --   --   --   --   --   --   CREATININE 1.30* 0.78  --   --  0.83  --   --   --   --   --   TROPONINI  --   --   --   --   --   --  <0.03 <0.03 <0.03  --    < > = values in this interval not displayed.    Estimated Creatinine Clearance: 135.3 mL/min (by C-G formula based on SCr of 0.83 mg/dL).   Medical History: Past Medical History:  Diagnosis Date  . Degenerative disc disease, lumbar   . Gout   . History of pernicious anemia 1980   Patient says he takes OTC iron supplements QD  . Hypertension     Medications:  Scheduled:  . sodium chloride   Intravenous Once  . sodium chloride   Intravenous Once  . dexamethasone  8 mg Intravenous Q6H  . docusate sodium  100 mg Oral Daily  . doxycycline  100 mg Oral Q12H  . febuxostat  40 mg Oral Daily  . gabapentin  300 mg Oral TID  . Influenza vac split quadrivalent PF  0.5 mL Intramuscular Tomorrow-1000  .  insulin aspart  0-5 Units Subcutaneous QHS  . insulin aspart  0-9 Units Subcutaneous TID WC  . methocarbamol  750 mg Oral QID  . midodrine  5 mg Oral TID WC  . nystatin  5 mL Oral TID AC & HS  . pantoprazole  40 mg Oral Daily  . polyethylene glycol  17 g Oral BID  . senna-docusate  1 tablet Oral QHS    Assessment: 52 y.o. male  s/p cervical fusion 10/2 and epidural hematoma evacuation 10/10, now with bilateral DVTs and thrombocytopenia.  Pharmacy asked to initiate argatroban therapy  10/23 PM update: argatroban has been switched to bivalirudin, aPTT therapeutic x 2 on bival  Goal of Therapy:  APTT 50-85 secs Monitor platelets by anticoagulation protocol: Yes   Plan:  Continue Bivalirudin 0.15 mg/kg/hr Daily aPTT/CBC  Abran Duke, PharmD, BCPS Clinical Pharmacist Phone: [phone]

## 2018-07-06 NOTE — Progress Notes (Addendum)
Progress Note  Patient Name: [name] [name] Date of Encounter: 07/06/2018  Primary Cardiologist: Charlton Haws, MD   Subjective   Only complaint this morning is the frequency of blood draws overnight, for which he requests a PICC line. He has had a very complicated DVT/surgical history since 03/2018 and states he is on his 6th blood thinner since then and is hopeful to get some answers soon as to what will be effective for him. No complaints of chest pain, SOB, palpitations, dizziness, or lightheadedness. He reports only having his BP drop when he stands up/ takes a step but has not stood up on his own in the past 24-48 hours as PT has been using a lift to get him into a chair.  Inpatient Medications    Scheduled Meds: . sodium chloride   Intravenous Once  . sodium chloride   Intravenous Once  . dexamethasone  8 mg Intravenous Q6H  . docusate sodium  100 mg Oral Daily  . doxycycline  100 mg Oral Q12H  . febuxostat  40 mg Oral Daily  . gabapentin  300 mg Oral TID  . Influenza vac split quadrivalent PF  0.5 mL Intramuscular Tomorrow-1000  . insulin aspart  0-5 Units Subcutaneous QHS  . insulin aspart  0-9 Units Subcutaneous TID WC  . methocarbamol  750 mg Oral QID  . midodrine  5 mg Oral TID WC  . nystatin  5 mL Oral TID AC & HS  . pantoprazole  40 mg Oral Daily  . polyethylene glycol  17 g Oral BID  . senna-docusate  1 tablet Oral QHS   Continuous Infusions: . sodium chloride    . bivalirudin (ANGIOMAX) infusion 0.5 mg/mL (Non-ACS indications) 0.15 mg/kg/hr (07/06/18 0758)  .  ceFAZolin (ANCEF) IV    . lactated ringers 10 mL/hr at 07/02/18 0320   PRN Meds: acetaminophen **OR** acetaminophen, ALPRAZolam, alum & mag hydroxide-simeth, benzonatate, bisacodyl, cyclobenzaprine, HYDROcodone-homatropine, HYDROmorphone (DILAUDID) injection, menthol-cetylpyridinium **OR** phenol, morphine injection, ondansetron **OR** ondansetron (ZOFRAN) IV, oxyCODONE, oxyCODONE, sodium chloride flush,  sodium chloride flush   Vital Signs    Vitals:   07/05/18 1659 07/05/18 1900 07/05/18 2332 07/06/18 0721  BP: (!) 144/94 127/86 (!) 125/95 (!) 146/96  Pulse: 72 74 71 61  Resp: 14 17 18 15   Temp: 98.3 F (36.8 C) 98.2 F (36.8 C) 98.1 F (36.7 C) 97.7 F (36.5 C)  TempSrc: Oral Oral Oral Oral  SpO2: 98% 98% 98% 99%  Weight:      Height:        Intake/Output Summary (Last 24 hours) at 07/06/2018 0814 Last data filed at 07/06/2018 0751 Gross per 24 hour  Intake 572.93 ml  Output 1040 ml  Net -467.07 ml   Filed Weights   06/06/18 2246 06/14/18 1151 06/22/18 1641  Weight: 123.8 kg 123.8 kg 123.8 kg    Telemetry    Sinus rhythm/ sinus bradycardia (slowest HR 48 on telemetry overnight), overall appears improved with rates primarily in the 70s - Personally Reviewed  Physical Exam   GEN: Sitting upright in bed in no acute distress.   Neck: unable to assess JVD/carotid bruits given neck brace Cardiac: RRR, no murmurs, rubs, or gallops.  Respiratory: Clear to auscultation bilaterally anteriorly, no wheezes/ rales/ rhonchi GI: NABS, Soft, obese, nontender, non-distended  MS: No pitting edema; No deformity. TED hose in place Neuro:  Nonfocal, moving all extremities spontaneously Psych: Normal affect   Labs    Chemistry Recent Labs  Lab 07/03/18 0717 07/04/18  6578 07/05/18 0513  NA 138 139 139  K 4.8 4.5 4.7  CL 104 105 106  CO2 24 27 25   GLUCOSE 236* 180* 227*  BUN 35* 32* 38*  CREATININE 1.30* 0.78 0.83  CALCIUM 8.8* 9.1 9.0  GFRNONAA >60 >60 >60  GFRAA >60 >60 >60  ANIONGAP 10 7 8      Hematology Recent Labs  Lab 07/04/18 0433 07/05/18 0513 07/06/18 0411  WBC 12.6* 10.4 8.0  RBC 3.05* 3.11* 3.15*  HGB 9.0* 9.3* 9.6*  HCT 29.5* 30.4* 30.5*  MCV 96.7 97.7 96.8  MCH 29.5 29.9 30.5  MCHC 30.5 30.6 31.5  RDW 16.9* 17.0* 16.7*  PLT 70* 76* 72*    Cardiac Enzymes Recent Labs  Lab 07/05/18 1111 07/05/18 1612 07/05/18 2219  TROPONINI <0.03 <0.03  <0.03   No results for input(s): TROPIPOC in the last 168 hours.   BNPNo results for input(s): BNP, PROBNP in the last 168 hours.   DDimer No results for input(s): DDIMER in the last 168 hours.   Radiology    No results found.  Cardiac Studies   Echo 03/2018: Study Conclusions - Left ventricle: The cavity size was mildly dilated. Wall thickness was normal. Systolic function was normal. The estimatedejection fraction was in the range of 50% to 55%. Wall motion wasnormal; there were no regional wall motion abnormalities.Features are consistent with a pseudonormal left ventricularfilling pattern, with concomitant abnormal relaxation andincreased filling pressure (grade 2 diastolic dysfunction).  Impressions: - Technically difficult; definity used; normal LV systolic function; moderate diastolic dysfunction; mild LVE.   Patient Profile     [name] [name] is a 52 y.o. male with a hx of DVT and PE, on eliquis in July, recent spinal cord decompression and ACDF complicated by bleeding and hematoma, hx of bradycardia after surgery 03/2018, HTN, and gout who is being followed by cardiology for the evaluation of bradycardia  Assessment & Plan    1. Sinus bradycardia: felt to be 2/2 argatroban. Transitioned to bivalirudin with only sinus bradycardia to 47 overnight while sleeping. Still no evidence of heart block or arrhythmia on telemetry. He remains asymptomatic.  - Continue to avoid AV nodal blocking agents  2. Hypotension: patient reports drop in blood pressure when he stands/takes a step. He has not been able to stand/walk for the past 24-48 hours and PT has been using a lift to get him into a chair. He is on midodrine TID for BP support. Currently with permissive HTN. Felt to be 2/2 cord compression, autonomic dysfunction, and argatroban (discontinued).  - Continue midodrine for now  3. DVTs: HIT panel and serotonin studies pending to determine po anticoagulation option.  Hematology following. Currently on bivalirudin - Continue management per primary team/ hematology  4. Neck surgery c/b hematoma: s/p multiple operations in the past several months - Continue management per neurosurgery.  For questions or updates, please contact [facility] Please consult [email or web address] for contact info under Cardiology/STEMI.      Signed, Beatriz Stallion, PA-C  07/06/2018, 8:14 AM   [phone]  Patient is extremely depressed. Only can tolerate being in chair 20-30 minutes pain in RLE worst. Anticoagulation per hematology SR 70's no indication for pacer argatroban d/c Exam with obese black male Neck collar in place distant heart sounds Lungs clear anteriorly LE edema due to DVT;s right worse than left  Charlton Haws

## 2018-07-06 NOTE — Progress Notes (Signed)
Subjective: Patient reports doing ok, strength about the same since yesterday.   Objective: Vital signs in last 24 hours: Temp:  [97.7 F (36.5 C)-98.3 F (36.8 C)] 97.8 F (36.6 C) (10/24 1522) Pulse Rate:  [49-74] 49 (10/24 1522) Resp:  [13-18] 13 (10/24 1522) BP: (125-184)/(86-108) 169/101 (10/24 1522) SpO2:  [98 %-100 %] 100 % (10/24 1522)  Intake/Output from previous day: 10/23 0701 - 10/24 0700 In: 786.2 [P.O.:240; I.V.:546.2] Out: 890 [Urine:850; Drains:40] Intake/Output this shift: Total I/O In: 265.8 [P.O.:120; I.V.:145.8] Out: 350 [Urine:350]  Neurologic: Grossly normal Left sided weakness   Lab Results: Lab Results  Component Value Date   WBC 8.0 07/06/2018   HGB 9.6 (L) 07/06/2018   HCT 30.5 (L) 07/06/2018   MCV 96.8 07/06/2018   PLT 72 (L) 07/06/2018   Lab Results  Component Value Date   INR 1.26 07/04/2018   BMET Lab Results  Component Value Date   NA 139 07/05/2018   K 4.7 07/05/2018   CL 106 07/05/2018   CO2 25 07/05/2018   GLUCOSE 227 (H) 07/05/2018   BUN 38 (H) 07/05/2018   CREATININE 0.83 07/05/2018   CALCIUM 9.0 07/05/2018    Studies/Results: [place] Ekg Site Rite  Result Date: 07/06/2018 If Site Rite image not attached, placement could not be confirmed due to current cardiac rhythm.   Assessment/Plan: Doing ok today, On bivalirudin now because of bradycardia on argatroban. JP drain removed, bivalirudin stopped 1 hour before pulling drain. Will resume in . 2 staples placed.    LOS: 29 days    Tiana Loft [facility] 07/06/2018, 4:51 PM

## 2018-07-06 NOTE — Progress Notes (Signed)
Peripherally Inserted Central Catheter/Midline Placement  The IV Nurse has discussed with the patient and/or persons authorized to consent for the patient, the purpose of this procedure and the potential benefits and risks involved with this procedure.  The benefits include less needle sticks, lab draws from the catheter, and the patient may be discharged home with the catheter. Risks include, but not limited to, infection, bleeding, blood clot (thrombus formation), and puncture of an artery; nerve damage and irregular heartbeat and possibility to perform a PICC exchange if needed/ordered by physician.  Alternatives to this procedure were also discussed.  Bard Power PICC patient education guide, fact sheet on infection prevention and patient information card has been provided to patient /or left at bedside.    PICC/Midline Placement Documentation  PICC Double Lumen 07/06/18 PICC Left Basilic 48 cm 0 cm (Active)  Indication for Insertion or Continuance of Line Prolonged intravenous therapies 07/06/2018 11:53 AM  Exposed Catheter (cm) 0 cm 07/06/2018 11:53 AM  Site Assessment Clean;Dry;Intact 07/06/2018 11:53 AM  Lumen #1 Status Flushed;Saline locked;Blood return noted 07/06/2018 11:53 AM  Lumen #2 Status Flushed;Saline locked;Blood return noted 07/06/2018 11:53 AM  Dressing Type Transparent 07/06/2018 11:53 AM  Dressing Status Clean;Dry;Intact 07/06/2018 11:53 AM  Dressing Intervention New dressing 07/06/2018 11:53 AM  Dressing Change Due 07/13/18 07/06/2018 11:53 AM       Newell Frater, Lajean Manes 07/06/2018, 11:54 AM

## 2018-07-06 NOTE — Progress Notes (Signed)
Physical Therapy Treatment Patient Details Name: [name] [name] MRN: [id number] DOB: [birth year] Today's Date: 07/06/2018    History of Present Illness 52 y.o. male admitted on 06/06/18 for bil LE wekness and muscle spasm after multiple falls (with resultant R knee meniscus tear placed in bledsoe brace) following a recent ACDF at C[facility] in July 2019 (complicated by PE (s/p IVC filter), CIR, and then SNF placement before finally returning home with his parents).  In the ED MRI of c-spine which showed post operative changes and cord edema and possibility of ischemia.  Pt also running low grade fevers without any clear source. His R knee was aspirated on 06/09/18.  Pt underwent posterior cervical decompression and fusion on 06/14/18.  Pt had to have re-exploration of posterior cervical wound for evacuation of epidural hematoma and placement of hemovac on 06/22/18.  Entire acute course complicated by hypotension in standing (thought to be autonomic) and by finding of multiple LE DVTs (already has an IVC filter in place, but per MDs notes at risk for blocking the filter with these clots).  Pt with other significant PMH of HTN, gout, anemia, DDD (lumbar).  Pt with post op issues with incisional bleeding s/p 2 evacuations of hematomas and plcement of JP drain (06/22/18 and 07/01/18) as well as multiple units of plasma.  Cardiology consulted 07/05/18 due to bradycardia thought to be from his newly started blood thinner.  Cardiology consulted and pharmacy changing this blood thinner.     PT Comments    Pt having a hard time motivating to move today.  "What's the point?".  He just had his PICC line placed and I think he was going to decline OOB, but his new ICU bed is in the hallway and he knew he needed to get OOB to switch the two beds out, so he agreed to at least OOB mobility.  He is learning to use his biceps to help hold himself against the bed rail when rolling and I would like to continue to work on sitting  balance EOB with him when he is agreeable.  PT will continue to follow acutely for safe mobility progression  Follow Up Recommendations  [facility]     Equipment Recommendations  Wheelchair (measurements PT);Wheelchair cushion (measurements PT);Hospital bed;Other (comment)(hoyer lift)    Recommendations for Other Services   NA     Precautions / Restrictions Precautions Precautions: Fall;Cervical Precaution Comments: Pt has JP drain Required Braces or Orthoses: Knee Immobilizer - Right;Cervical Brace Knee Immobilizer - Right: On when out of bed or walking(really for standing and gait/pre gait ) Cervical Brace: Hard collar Other Brace/Splint: Pt has two Bledsoe braces, the one we have been using is the long leg brace that is locked in full extension.     Mobility  Bed Mobility Overal bed mobility: Needs Assistance Bed Mobility: Rolling Rolling: +2 for physical assistance;Max assist         General bed mobility comments: Two person max assist to help pt roll.  When rolling left he is able to hook the bed rail with his elbow/biceps vs gripping with his hand as that is weaker.  I don't think his biceps is strong enough on the left to do this going to the right, but would be worth a try next session.   Transfers Overall transfer level: Needs assistance               General transfer comment: Maxi move OOB to the recliner chair to facilitate OOB time (pt left  fully upright sitting today with feet down).  Pt reporting, "I don't get the point" Pt finding it hard to motivate to move.    Ambulation/Gait             General Gait Details: Unable at this time.           Balance Overall balance assessment: Needs assistance     Sitting balance - Comments: Pt did not want to sit up EOB today, just had PICC line placed and was likely to refuse me, but I convinced him that he needed to get up so that the RN could change him to the ICU bed (which was true, and he agreed to  move).  Spirits were a bit higher after getting up and his mother is coming to visit him this PM.                                     Cognition Arousal/Alertness: Awake/alert Behavior During Therapy: WFL for tasks assessed/performed Overall Cognitive Status: Within Functional Limits for tasks assessed                                           General Comments General comments (skin integrity, edema, etc.): HR 50-60s today, BP stable.      Pertinent Vitals/Pain Pain Assessment: Faces Faces Pain Scale: Hurts whole lot Pain Location: posterior neck Pain Descriptors / Indicators: Sore Pain Intervention(s): Limited activity within patient's tolerance;Repositioned;Monitored during session           PT Goals (current goals can now be found in the care plan section) Acute Rehab PT Goals Patient Stated Goal: to walk again Progress towards PT goals: Progressing toward goals    Frequency    Min 3X/week      PT Plan Current plan remains appropriate       AM-PAC PT "6 Clicks" Daily Activity  Outcome Measure  Difficulty turning over in bed (including adjusting bedclothes, sheets and blankets)?: Unable Difficulty moving from lying on back to sitting on the side of the bed? : Unable Difficulty sitting down on and standing up from a chair with arms (e.g., wheelchair, bedside commode, etc,.)?: Unable Help needed moving to and from a bed to chair (including a wheelchair)?: Total Help needed walking in hospital room?: Total Help needed climbing 3-5 steps with a railing? : Total 6 Click Score: 6    End of Session Equipment Utilized During Treatment: Cervical collar Activity Tolerance: Patient tolerated treatment well Patient left: in chair;with call bell/phone within reach   PT Visit Diagnosis: Other abnormalities of gait and mobility (R26.89);Other symptoms and signs involving the nervous system (R29.898);Muscle weakness (generalized) (M62.81)      Time: 1610-9604 PT Time Calculation (min) (ACUTE ONLY): 47 min  Charges:  $Therapeutic Activity: 38-52 mins          Jilleen Essner B. Yuya Vanwingerden, PT, DPT  Acute Rehabilitation [phone] pager #[phone] office            07/06/2018, 1:05 PM

## 2018-07-06 NOTE — Progress Notes (Signed)
Pharmacy Antibiotic Note  [name] [name] is a 52 y.o. male  Pharmacy has been consulted for Cefazolin dosing while neck drain in place.  Also on oral Doxycycline.  Plan:  Cefazolin 2 gm IV q8hrs  Good renal function; do not expect any need to adjust dose.  Follow up for removal of drain and length of antibiotic course.  Also on Doxycycline 100 mg PO BID since 10/18. Stop soon?  Height: 5\' 9"  (175.3 cm) Weight: 272 lb 14.9 oz (123.8 kg) IBW/kg (Calculated) : 70.7  Temp (24hrs), Avg:98.1 F (36.7 C), Min:97.7 F (36.5 C), Max:98.3 F (36.8 C)  Recent Labs  Lab 07/01/18 0606 07/02/18 0612 07/03/18 0717 07/04/18 0433 07/05/18 0513 07/06/18 0411  WBC 10.6* 10.5 11.5* 12.6* 10.4 8.0  CREATININE 0.89 0.80 1.30* 0.78 0.83  --     Estimated Creatinine Clearance: 135.3 mL/min (by C-G formula based on SCr of 0.83 mg/dL).    Allergies  Allergen Reactions  . Nsaids Other (See Comments)    G6PD DEFICIENCY  . Sulfa Antibiotics Other (See Comments)    G6PD DEFICIENCY  . Protonix [Pantoprazole Sodium] Diarrhea    Severe diarrhea, Upset stomach    Antimicrobials this admission: Fluconazole 10/15 >> 10/22 Nystatin oral 10/15 >> Doxycycline PO 10/18>> Cefazolin 10/24>>  Dose adjustments this admission:  n/a  Microbiology results: 9/25 Urine - <10 K/ml, insignificant growth 9/25 Blood x 2  - negative 9/26 Blood x 2 - negative 9/27 right knee synovial fluid - negative 10/2 MRSA PCR - negative 10/2 surgical MRSA PCR: postive for Staph but negative for MRSA 10/2 MRSA PCR - negative  Thank you for allowing pharmacy to be a part of this patient's care.  Dennie Fetters, [place] Pager: [phone] or phone: [phone] 07/06/2018 12:30 PM

## 2018-07-07 ENCOUNTER — Encounter (HOSPITAL_COMMUNITY): Payer: Self-pay | Admitting: Anesthesiology

## 2018-07-07 ENCOUNTER — Encounter (HOSPITAL_COMMUNITY)
Admission: AD | Disposition: A | Discharge status: Rehabilitation Facility | Payer: Self-pay | Source: Other Acute Inpatient Hospital | Attending: Internal Medicine

## 2018-07-07 ENCOUNTER — Inpatient Hospital Stay (HOSPITAL_COMMUNITY): Admitting: Anesthesiology

## 2018-07-07 ENCOUNTER — Encounter: Admitting: Registered Nurse

## 2018-07-07 DIAGNOSIS — I495 Sick sinus syndrome: Secondary | ICD-10-CM

## 2018-07-07 HISTORY — PX: POSTERIOR CERVICAL LAMINECTOMY: SHX2248

## 2018-07-07 LAB — GLUCOSE, CAPILLARY
GLUCOSE-CAPILLARY: 241 mg/dL — AB (ref 70–99)
GLUCOSE-CAPILLARY: 300 mg/dL — AB (ref 70–99)
Glucose-Capillary: 246 mg/dL — ABNORMAL HIGH (ref 70–99)
Glucose-Capillary: 315 mg/dL — ABNORMAL HIGH (ref 70–99)

## 2018-07-07 LAB — BASIC METABOLIC PANEL
ANION GAP: 8 (ref 5–15)
BUN: 39 mg/dL — ABNORMAL HIGH (ref 6–20)
CALCIUM: 8.9 mg/dL (ref 8.9–10.3)
CO2: 24 mmol/L (ref 22–32)
Chloride: 106 mmol/L (ref 98–111)
Creatinine, Ser: 0.94 mg/dL (ref 0.61–1.24)
GFR calc non Af Amer: >60 mL/min (ref >60)
GLUCOSE: 308 mg/dL — AB (ref 70–99)
POTASSIUM: 4.3 mmol/L (ref 3.5–5.1)
Sodium: 138 mmol/L (ref 135–145)

## 2018-07-07 LAB — CBC
HEMATOCRIT: 31 % — AB (ref 39.0–52.0)
HEMATOCRIT: 33.1 % — AB (ref 39.0–52.0)
HEMOGLOBIN: 9.6 g/dL — AB (ref 13.0–17.0)
HEMOGLOBIN: 10.2 g/dL — AB (ref 13.0–17.0)
MCH: 30.3 pg (ref 26.0–34.0)
MCH: 30.4 pg (ref 26.0–34.0)
MCHC: 31 g/dL (ref 30.0–36.0)
MCHC: 30.8 g/dL (ref 30.0–36.0)
MCV: 97.8 fL (ref 80.0–100.0)
MCV: 98.8 fL (ref 80.0–100.0)
NRBC: 0 % (ref 0.0–0.2)
Platelets: 80 10*3/uL — ABNORMAL LOW (ref 150–400)
Platelets: 82 10*3/uL — ABNORMAL LOW (ref 150–400)
RBC: 3.17 MIL/uL — AB (ref 4.22–5.81)
RBC: 3.35 MIL/uL — AB (ref 4.22–5.81)
RDW: 16.7 % — ABNORMAL HIGH (ref 11.5–15.5)
RDW: 16.8 % — ABNORMAL HIGH (ref 11.5–15.5)
WBC: 7.2 10*3/uL (ref 4.0–10.5)
WBC: 6.9 10*3/uL (ref 4.0–10.5)
nRBC: 0 % (ref 0.0–0.2)

## 2018-07-07 LAB — PROTIME-INR
INR: 1.7
Prothrombin Time: 19.8 seconds — ABNORMAL HIGH (ref 11.4–15.2)

## 2018-07-07 LAB — APTT
APTT: 56 s — AB (ref 24–36)
aPTT: 48 seconds — ABNORMAL HIGH (ref 24–36)

## 2018-07-07 SURGERY — POSTERIOR CERVICAL LAMINECTOMY
Anesthesia: General | Site: Spine Cervical

## 2018-07-07 MED ORDER — SODIUM CHLORIDE 0.9 % IV SOLN
INTRAVENOUS | Status: DC | PRN
  Administered 2018-07-07: 50 ug/min via INTRAVENOUS

## 2018-07-07 MED ORDER — DEXAMETHASONE SODIUM PHOSPHATE 10 MG/ML IJ SOLN
INTRAMUSCULAR | Status: DC | PRN
  Administered 2018-07-07: 10 mg via INTRAVENOUS

## 2018-07-07 MED ORDER — BUPIVACAINE HCL (PF) 0.25 % IJ SOLN
INTRAMUSCULAR | Status: AC
  Filled 2018-07-07: qty 30

## 2018-07-07 MED ORDER — SODIUM CHLORIDE 0.9% IV SOLUTION: Freq: Once | INTRAVENOUS | Status: DC

## 2018-07-07 MED ORDER — FENTANYL CITRATE (PF) 100 MCG/2ML IJ SOLN
INTRAMUSCULAR | Status: DC | PRN
  Administered 2018-07-07: 50 ug via INTRAVENOUS
  Administered 2018-07-07: 100 ug via INTRAVENOUS

## 2018-07-07 MED ORDER — BACITRACIN ZINC 500 UNIT/GM EX OINT
TOPICAL_OINTMENT | CUTANEOUS | Status: DC | PRN
  Administered 2018-07-07: 1 via TOPICAL

## 2018-07-07 MED ORDER — LIDOCAINE 2% (20 MG/ML) 5 ML SYRINGE
INTRAMUSCULAR | Status: DC | PRN
  Administered 2018-07-07: 100 mg via INTRAVENOUS

## 2018-07-07 MED ORDER — 0.9 % SODIUM CHLORIDE (POUR BTL) OPTIME
TOPICAL | Status: DC | PRN
  Administered 2018-07-07: 1000 mL

## 2018-07-07 MED ORDER — HEMOSTATIC AGENTS (NO CHARGE) OPTIME
TOPICAL | Status: DC | PRN
  Administered 2018-07-07: 1

## 2018-07-07 MED ORDER — SUGAMMADEX SODIUM 500 MG/5ML IV SOLN
INTRAVENOUS | Status: AC
  Filled 2018-07-07: qty 5

## 2018-07-07 MED ORDER — THROMBIN 5000 UNITS EX SOLR
CUTANEOUS | Status: DC | PRN
  Administered 2018-07-07 (×2): 5000 [IU] via TOPICAL

## 2018-07-07 MED ORDER — HYDRALAZINE HCL 20 MG/ML IJ SOLN
INTRAMUSCULAR | Status: AC
  Administered 2018-07-07: 10 mg via INTRAVENOUS
  Filled 2018-07-07: qty 1

## 2018-07-07 MED ORDER — GERHARDT'S BUTT CREAM
TOPICAL_CREAM | Freq: Two times a day (BID) | CUTANEOUS | Status: DC
  Administered 2018-07-07 – 2018-07-28 (×35): via TOPICAL
  Administered 2018-07-29: 1 via TOPICAL
  Administered 2018-07-29 – 2018-08-14 (×31): via TOPICAL
  Filled 2018-07-07 (×5): qty 1

## 2018-07-07 MED ORDER — FENTANYL CITRATE (PF) 250 MCG/5ML IJ SOLN
INTRAMUSCULAR | Status: AC
  Filled 2018-07-07: qty 5

## 2018-07-07 MED ORDER — BACITRACIN ZINC 500 UNIT/GM EX OINT
TOPICAL_OINTMENT | CUTANEOUS | Status: AC
  Filled 2018-07-07: qty 28.35

## 2018-07-07 MED ORDER — SODIUM CHLORIDE 0.9 % IV SOLN
INTRAVENOUS | Status: DC | PRN
  Administered 2018-07-07: 500 mL

## 2018-07-07 MED ORDER — SODIUM CHLORIDE 0.9 % IV SOLN
INTRAVENOUS | Status: DC | PRN
  Administered 2018-07-07: 13:00:00 via INTRAVENOUS

## 2018-07-07 MED ORDER — ROCURONIUM BROMIDE 10 MG/ML (PF) SYRINGE
PREFILLED_SYRINGE | INTRAVENOUS | Status: DC | PRN
  Administered 2018-07-07: 10 mg via INTRAVENOUS
  Administered 2018-07-07: 50 mg via INTRAVENOUS

## 2018-07-07 MED ORDER — ONDANSETRON HCL 4 MG/2ML IJ SOLN
INTRAMUSCULAR | Status: DC | PRN
  Administered 2018-07-07: 4 mg via INTRAVENOUS

## 2018-07-07 MED ORDER — ONDANSETRON HCL 4 MG/2ML IJ SOLN
INTRAMUSCULAR | Status: AC
  Filled 2018-07-07: qty 2

## 2018-07-07 MED ORDER — MIDAZOLAM HCL 5 MG/5ML IJ SOLN
INTRAMUSCULAR | Status: DC | PRN
  Administered 2018-07-07: 2 mg via INTRAVENOUS

## 2018-07-07 MED ORDER — SUGAMMADEX SODIUM 200 MG/2ML IV SOLN
INTRAVENOUS | Status: DC | PRN
  Administered 2018-07-07: 250 mg via INTRAVENOUS

## 2018-07-07 MED ORDER — LIDOCAINE-EPINEPHRINE 1 %-1:100000 IJ SOLN
INTRAMUSCULAR | Status: AC
  Filled 2018-07-07: qty 1

## 2018-07-07 MED ORDER — PROPOFOL 10 MG/ML IV BOLUS
INTRAVENOUS | Status: DC | PRN
  Administered 2018-07-07: 150 mg via INTRAVENOUS

## 2018-07-07 MED ORDER — ROCURONIUM BROMIDE 50 MG/5ML IV SOSY
PREFILLED_SYRINGE | INTRAVENOUS | Status: AC
  Filled 2018-07-07: qty 5

## 2018-07-07 MED ORDER — PROMETHAZINE HCL 25 MG/ML IJ SOLN: 6.2500 mg | INTRAMUSCULAR | Status: DC | PRN

## 2018-07-07 MED ORDER — MIDAZOLAM HCL 2 MG/2ML IJ SOLN
INTRAMUSCULAR | Status: AC
  Filled 2018-07-07: qty 2

## 2018-07-07 MED ORDER — LACTATED RINGERS IV SOLN
INTRAVENOUS | Status: DC
  Administered 2018-07-07 – 2018-07-10 (×5): via INTRAVENOUS

## 2018-07-07 MED ORDER — PROPOFOL 10 MG/ML IV BOLUS
INTRAVENOUS | Status: AC
  Filled 2018-07-07: qty 20

## 2018-07-07 MED ORDER — HYDRALAZINE HCL 20 MG/ML IJ SOLN
10.0000 mg | Freq: Once | INTRAMUSCULAR | Status: AC
  Administered 2018-07-07: 10 mg via INTRAVENOUS

## 2018-07-07 MED ORDER — THROMBIN 5000 UNITS EX SOLR
CUTANEOUS | Status: AC
  Filled 2018-07-07: qty 10000

## 2018-07-07 MED ORDER — LACTATED RINGERS IV SOLN
INTRAVENOUS | Status: DC | PRN
  Administered 2018-07-07: 12:00:00 via INTRAVENOUS

## 2018-07-07 MED ORDER — FENTANYL CITRATE (PF) 100 MCG/2ML IJ SOLN: 25.0000 ug | INTRAMUSCULAR | Status: DC | PRN

## 2018-07-07 SURGICAL SUPPLY — 64 items
BAG DECANTER FOR FLEXI CONT (MISCELLANEOUS) ×3 IMPLANT
BENZOIN TINCTURE PRP APPL 2/3 (GAUZE/BANDAGES/DRESSINGS) ×6 IMPLANT
BLADE CLIPPER SURG (BLADE) ×3 IMPLANT
BLADE SURG 11 STRL SS (BLADE) ×3 IMPLANT
BUR MATCHSTICK NEURO 3.0 LAGG (BURR) ×3 IMPLANT
CANISTER SUCT 3000ML PPV (MISCELLANEOUS) ×3 IMPLANT
CARTRIDGE OIL MAESTRO DRILL (MISCELLANEOUS) ×1 IMPLANT
CLOSURE WOUND 1/2 X4 (GAUZE/BANDAGES/DRESSINGS) ×1
COVER WAND RF STERILE (DRAPES) ×3 IMPLANT
DECANTER SPIKE VIAL GLASS SM (MISCELLANEOUS) ×3 IMPLANT
DERMABOND ADVANCED (GAUZE/BANDAGES/DRESSINGS) ×2
DERMABOND ADVANCED .7 DNX12 (GAUZE/BANDAGES/DRESSINGS) ×1 IMPLANT
DIFFUSER DRILL AIR PNEUMATIC (MISCELLANEOUS) ×3 IMPLANT
DRAPE C-ARM 42X72 X-RAY (DRAPES) ×6 IMPLANT
DRAPE LAPAROTOMY 100X72 PEDS (DRAPES) ×3 IMPLANT
DRAPE MICROSCOPE LEICA (MISCELLANEOUS) IMPLANT
DRAPE SURG 17X23 STRL (DRAPES) ×3 IMPLANT
DRESSING OPSITE X SMALL 2X3 (GAUZE/BANDAGES/DRESSINGS) ×3 IMPLANT
DRSG OPSITE POSTOP 4X8 (GAUZE/BANDAGES/DRESSINGS) ×3 IMPLANT
DURAPREP 26ML APPLICATOR (WOUND CARE) ×3 IMPLANT
ELECT REM PT RETURN 9FT ADLT (ELECTROSURGICAL) ×3
ELECTRODE REM PT RTRN 9FT ADLT (ELECTROSURGICAL) ×1 IMPLANT
EVACUATOR 3/16  PVC DRAIN (DRAIN) ×2
EVACUATOR 3/16 PVC DRAIN (DRAIN) ×1 IMPLANT
GAUZE 4X4 16PLY RFD (DISPOSABLE) IMPLANT
GAUZE SPONGE 4X4 12PLY STRL (GAUZE/BANDAGES/DRESSINGS) ×3 IMPLANT
GLOVE BIO SURGEON STRL SZ7 (GLOVE) IMPLANT
GLOVE BIO SURGEON STRL SZ8 (GLOVE) ×3 IMPLANT
GLOVE BIOGEL PI IND STRL 7.0 (GLOVE) IMPLANT
GLOVE BIOGEL PI INDICATOR 7.0 (GLOVE)
GLOVE EXAM NITRILE LRG STRL (GLOVE) IMPLANT
GLOVE EXAM NITRILE XL STR (GLOVE) IMPLANT
GLOVE EXAM NITRILE XS STR PU (GLOVE) IMPLANT
GLOVE INDICATOR 8.5 STRL (GLOVE) ×3 IMPLANT
GOWN STRL REUS W/ TWL LRG LVL3 (GOWN DISPOSABLE) IMPLANT
GOWN STRL REUS W/ TWL XL LVL3 (GOWN DISPOSABLE) ×1 IMPLANT
GOWN STRL REUS W/TWL 2XL LVL3 (GOWN DISPOSABLE) IMPLANT
GOWN STRL REUS W/TWL LRG LVL3 (GOWN DISPOSABLE)
GOWN STRL REUS W/TWL XL LVL3 (GOWN DISPOSABLE) ×2
KIT BASIN OR (CUSTOM PROCEDURE TRAY) ×3 IMPLANT
KIT SUTURE REMOVAL HAMOT (SET/KITS/TRAYS/PACK) ×3 IMPLANT
KIT TURNOVER KIT B (KITS) ×3 IMPLANT
MARKER SKIN DUAL TIP RULER LAB (MISCELLANEOUS) ×3 IMPLANT
NEEDLE HYPO 25X1 1.5 SAFETY (NEEDLE) ×3 IMPLANT
NEEDLE SPNL 20GX3.5 QUINCKE YW (NEEDLE) ×3 IMPLANT
NS IRRIG 1000ML POUR BTL (IV SOLUTION) ×3 IMPLANT
OIL CARTRIDGE MAESTRO DRILL (MISCELLANEOUS) ×3
PACK LAMINECTOMY NEURO (CUSTOM PROCEDURE TRAY) ×3 IMPLANT
PAD ARMBOARD 7.5X6 YLW CONV (MISCELLANEOUS) ×9 IMPLANT
PIN MAYFIELD SKULL DISP (PIN) ×3 IMPLANT
RUBBERBAND STERILE (MISCELLANEOUS) IMPLANT
SPONGE LAP 4X18 RFD (DISPOSABLE) IMPLANT
SPONGE SURGIFOAM ABS GEL SZ50 (HEMOSTASIS) ×3 IMPLANT
STRIP CLOSURE SKIN 1/2X4 (GAUZE/BANDAGES/DRESSINGS) ×2 IMPLANT
SUT ETHILON 3 0 FSL (SUTURE) ×3 IMPLANT
SUT ETHILON 4 0 PS 2 18 (SUTURE) IMPLANT
SUT VIC AB 0 CT1 18XCR BRD8 (SUTURE) ×1 IMPLANT
SUT VIC AB 0 CT1 8-18 (SUTURE) ×2
SUT VIC AB 2-0 CT1 18 (SUTURE) ×6 IMPLANT
SUT VICRYL 4-0 PS2 18IN ABS (SUTURE) ×3 IMPLANT
TOWEL GREEN STERILE (TOWEL DISPOSABLE) ×3 IMPLANT
TOWEL GREEN STERILE FF (TOWEL DISPOSABLE) ×3 IMPLANT
TRAY FOLEY MTR SLVR 16FR STAT (SET/KITS/TRAYS/PACK) IMPLANT
WATER STERILE IRR 1000ML POUR (IV SOLUTION) ×3 IMPLANT

## 2018-07-07 NOTE — Progress Notes (Signed)
PT Cancellation Note  Patient Details Name: [name] [name] MRN: [id number] DOB: 1965-09-24   Cancelled Treatment:    Reason Eval/Treat Not Completed: Patient at procedure or test/unavailable.  Pt going to surgery to explore his surgical wound.  Will defer today and try to see as able Mon. 07/07/2018  [name], PT Acute Rehabilitation Services [phone]  (pager) [phone]  (office)    [name] 07/07/2018, 11:32 AM

## 2018-07-07 NOTE — Transfer of Care (Signed)
Immediate Anesthesia Transfer of Care Note  Patient: [name] [name]  Procedure(s) Performed: Evacuation of Hematoma POSTERIOR CERVICAL (N/A Spine Cervical)  Patient Location: PACU  Anesthesia Type:General  Level of Consciousness: oriented, drowsy and patient cooperative  Airway & Oxygen Therapy: Patient Spontanous Breathing and Patient connected to face mask oxygen  Post-op Assessment: Report given to RN and Post -op Vital signs reviewed and stable  Post vital signs: Reviewed  Last Vitals:  Vitals Value Taken Time  BP 169/96 07/07/2018  2:06 PM  Temp 36.5 C 07/07/2018  2:08 PM  Pulse 63 07/07/2018  2:13 PM  Resp 12 07/07/2018  2:13 PM  SpO2 100 % 07/07/2018  2:13 PM  Vitals shown include unvalidated device data.  Last Pain:  Vitals:   07/07/18 1408  TempSrc:   PainSc: 0-No pain      Patients Stated Pain Goal: 4 (07/04/18 1616)  Complications: No apparent anesthesia complications

## 2018-07-07 NOTE — Progress Notes (Signed)
Subjective:  Patient reports patient reports increased numbness tingling weakness in his left hand starting at 6:00 this morning.  Objective: Vital signs in last 24 hours: Temp:  [97.6 F (36.4 C)-98.2 F (36.8 C)] 97.6 F (36.4 C) (10/25 0724) Pulse Rate:  [49-99] 88 (10/25 0827) Resp:  [13-17] 17 (10/25 0724) BP: (135-184)/(89-108) 153/89 (10/25 0724) SpO2:  [97 %-100 %] 97 % (10/25 0827) Weight:  [123.8 kg] 123.8 kg (10/25 1000)  Intake/Output from previous day: 10/24 0701 - 10/25 0700 In: 669 [P.O.:120; I.V.:440.4; IV Piggyback:108.6] Out: 700 [Urine:700] Intake/Output this shift: Total I/O In: 1820.1 [P.O.:120; IV Piggyback:1700.1] Out: 426 [Urine:425; Stool:1]  right hand grip and left hand grip strength decreased to 4 and 5 from yesterday biceps triceps strength appears to be stable deltoid stable weak still has some flicker movement of his left toes.  Lab Results: Recent Labs    07/06/18 0411 07/07/18 0600  WBC 8.0 7.2  HGB 9.6* 9.6*  HCT 30.5* 31.0*  PLT 72* 80*   BMET Recent Labs    07/05/18 0513  NA 139  K 4.7  CL 106  CO2 25  GLUCOSE 227*  BUN 38*  CREATININE 0.83  CALCIUM 9.0    Studies/Results: [place] Ekg Site Rite  Result Date: 07/06/2018 If Site Rite image not attached, placement could not be confirmed due to current cardiac rhythm.   Assessment/Plan: 52 year old gentleman with increased numbness tingling weakness in his hands feel this is consistent with every time that he has had a posterior cervical wound hemorrhage. So I've extensively gone over the risks and benefits of return to the OR for a reexploration of posterior cervical wound for evacuation of hematoma. Also talked to the family [place] perioperative course expectations of outcome and alternatives of surgery and they understand and agree to proceed forward.  LOS: 30 days     Feather Berrie P 07/07/2018, 11:02 AM

## 2018-07-07 NOTE — Anesthesia Procedure Notes (Signed)
Procedure Name: Intubation Date/Time: 07/07/2018 12:26 PM Performed by: Samara Deist, CRNA Pre-anesthesia Checklist: Patient identified, Emergency Drugs available, Suction available and Patient being monitored Patient Re-evaluated:Patient Re-evaluated prior to induction Oxygen Delivery Method: Circle System Utilized Preoxygenation: Pre-oxygenation with 100% oxygen Induction Type: IV induction Ventilation: Mask ventilation without difficulty Laryngoscope Size: Glidescope and 4 Grade View: Grade I Tube type: Oral Tube size: 7.5 mm Number of attempts: 1 Airway Equipment and Method: Stylet,  Oral airway and Video-laryngoscopy Placement Confirmation: ETT inserted through vocal cords under direct vision,  positive ETCO2 and breath sounds checked- equal and bilateral Secured at: 23 cm Tube secured with: Tape Dental Injury: Teeth and Oropharynx as per pre-operative assessment  Difficulty Due To: Difficult Airway- due to cervical collar Comments: Hard C-collar left in place for DL

## 2018-07-07 NOTE — Progress Notes (Signed)
PROGRESS NOTE    [name] [name]  ZOX:[id number] DOB: November 08, 1965 DOA: 06/06/2018 PCP: Devra Dopp, MD    Brief Narrative: Brief Narrative:  52 year old man with past medical history relevant for gout, hypertension anterior cervical corpectomy of C4 on 03/24/2018 with comp gated postoperative course including development of multiple PEs and DVTs started on anticoagulation and subsequently developed hematoma requiring reexploration on 03/28/2018 status post placement of IVC filter who was discharged to skilled nursing facility and had a complicated course including progressive lower extremity weakness, intermittent fevers of unclear etiology and hospitalizations for "pneumonia". Patient was admitted to this hospital is a transfer from [facility] with MRI of C-spine that showed postoperative changes and cord compression with cord edema and concern for ischemia. Patient is status post posterior cervical decompression laminectomy of C3, C4, C5, removal of spinous processes, foraminotomies on 06/14/2018. On 06/22/2018 patient began to develop worsening left sided paresthesias and weakness and a stat MRI showed evidence of postoperative hematoma. Patient was taken to the OR with evacuation of hematoma. Additionally his postoperative course has been complicated by recurrent fevers,bilateral lower extremity DVTs on ultrasound,migratory arthralgias/arthritis that are thought to be rheumatologic in nature as well as significant orthostasis limiting the amount he can work with physical therapy.  Noted to have decreased left hand grip on 10/19, MRI showed large epidural hematoma, patient s/p clot evacuation. Continues to have thrombocytopenia   Assessment & Plan:   Principal Problem:   Fever Active Problems:   Myelopathy (HCC)   Benign essential HTN   Cord compression (HCC)   History of pulmonary embolism   Spondylosis, cervical, with myelopathy   Labile blood pressure   Orthostatic  hypotension   Leukocytosis   Tachycardia-bradycardia syndrome (HCC)   Near syncope due to orthostatic hypotension Thought to be related to hypovolemia and autonomic dysfunction.  On midodrine and TED hose.  Echocardiogram July 2019 showed a normal systolic function, grade 2 diastolic dysfunction.  No significant valve abnormalities are present Evaluated by cardiology. Continue with midodrine. Abdominal binder ordered.  Stable.   Bradycardia;  Notice have lower HR 10-23. Asymptomatic.  Cardiology consulted, for both bradycardia, and syncope and orthostatic hypotension.  No indication for pacer. Thought argatroban might be causing bradycardia.  Stable.   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 And today 10-25 -On IV decadron.  -IV antibiotics per sx.   Polyarthralgia/persistent fever -Patient had symptoms in the shoulders and ankles, along with a history of gout and pain in his knees.  He underwent arthrocentesis however no evidence of infection was found. -He was noted to have elevated inflammatory markers including CCP, CRP.  However ANCA, aldolase, ANA were negative. -Previous rounding physician discussed with Dr. Deanne Coffer, rheumatologist, he will follow-up with the patient as an outpatient. -Patient was placed on prednisone however currently on dexamethasone (neurosurgery) Fever resolved./  Denies joint pain.   Thrombocytopenia/coagulopathy -Cardiolipin antibody negative -Lupus anticoagulant, homocysteine level, factor VIII within normal limits -Protein S activity 60  (low) -Protein C total >200; Activity 199 (high)  -HIT panel 0.486 (mildly elevated)- SRA (serotonin release assay) negative -immature platelet fraction WNL -Was given 2u platelets on 10/19 in the OR  Platelet stable. And 2 units 10-25 for OR.  Hematology following.   Recent PE /DVT in setting of spine surgery -Status post IVC filter placement on March 28, 2018 -Patient was on apixaban however changed over to IV heparin.  Anticoagulation however was discontinued for surgery.  Patient also noted to have thrombocytopenia. -Vascular DVT US 06/26/2018 showed bilateral lower extremity DVTs likely acute -Case was discussed with  vascular surgery, Dr. Myra Gianotti, patient is out of the 3 week window for treatment, he would have to get TPA, which is contraindicated in a bleeding patient.  -Bivalirudin discontinue by neurosurgery. Will need to determine if anticoagulation will be resume.  Serotonin release assay negative.     Normocytic anemia Monitor hb.  Stable.   Gout -Does not seem to be active at this time, continue Uloric   Mild acute renal failure; resolved.   HTN; amlodipine discontinue due to orthostatic hypotension and edema.   Gout;  Continue with uloric.   Anemia;  was given 1u PRBC on 10/19 in OR Hb stable  DVT prophylaxis: argatroban.  Code Status: full code.  Family Communication: care discussed with patient,  Disposition Plan: remain in the hospital for treatment of Blood clot, monitor for development of hematoma.     Consultants:   Neurosurgery  Hematology  Cardiology    Procedures: On 10/2 1. posterior cervical decompressive laminectomy at C3, C4, C5 with removal of the spinous process and complete laminectomy and foraminotomies of the C3, C4, C5 nerve roots. 2. Posterior cervical fusion with lateral mass screws at C3-C4-C5 and pars screws at C2 lies in the globus ellipseLateral mass screw system 3. Posterior lateral arthrodesis C2-C5 utilizing locally  harvested autograft mixed with vivigen  06/22/2018: Procedure reexploration posterior cervical wound for evacuation of epidural hematoma  06/26/2018 DVT ultrasound:Right: Findings consistent with acute deep vein thrombosis involving the right common femoral vein, right femoral vein, right popliteal vein, right posterior tibial vein, and right peroneal vein. Thrombosis extends proximally into external iliac vein.  Proximal iliac vein not visualized. Left: Findings consistent with acute deep vein thrombosis involving the left common femoral vein, left femoral vein, left popliteal vein, left posterior tibial vein, and left peroneal vein. Findings consistent with acute superficial vein thrombosis  involving the left great saphenous vein. Thrombosis extends proximally into external iliac vein. Proximal iliac vein not visualized.  07/01/18 Clot evacuation   RUE doppler 07/02/18    Antimicrobials:    Subjective: He report worsening weakness of left side.    Objective: Vitals:   07/06/18 2332 07/06/18 2334 07/07/18 0724 07/07/18 0827  BP: (!) 135/100 (!) 152/95 (!) 153/89   Pulse: 99 99 84 88  Resp:   17   Temp: 98.1 F (36.7 C)  97.6 F (36.4 C)   TempSrc:   Oral   SpO2: 100% 100% 98% 97%  Weight:      Height:        Intake/Output Summary (Last 24 hours) at 07/07/2018 0919 Last data filed at 07/07/2018 0730 Gross per 24 hour  Intake 403.2 ml  Output 775 ml  Net -371.8 ml   Filed Weights   06/06/18 2246 06/14/18 1151 06/22/18 1641  Weight: 123.8 kg 123.8 kg 123.8 kg    Examination:  General exam: NAD Respiratory system: CTA Cardiovascular system: S 1, S 2 RRR Gastrointestinal system: BS present, soft, nt Central nervous system: alert and oriented. Left side weakness.  Extremities: positive for edema Skin: No rashes.     Data Reviewed: I  have personally reviewed following labs and imaging studies  CBC: Recent Labs  Lab 07/01/18 0606 07/02/18 0612  07/03/18 0717 07/04/18 0433 07/05/18 0513 07/06/18 0411 07/07/18 0600  WBC 10.6* 10.5 11.5* 12.6* 10.4 8.0 7.2  NEUTROABS 8.9* 9.5*  --   --   --   --   --   HGB 8.7* 9.4* 9.4* 9.0* 9.3* 9.6* 9.6*  HCT 28.9* 29.0* 29.5* 29.5* 30.4* 30.5* 31.0*  MCV 95.4 94.2 97.0 96.7 97.7 96.8 97.8  PLT 55* 76* 75* 70* 76* 72* 80*   Basic Metabolic Panel: Recent Labs  Lab 07/01/18 0606 07/02/18 0612 07/03/18 0717 07/04/18 0433 07/05/18 0513  NA 137 136 138 139 139  K 4.4 4.5 4.8 4.5 4.7  CL 104 100 104 105 106  CO2 27 26 24 27 25   GLUCOSE 168* 199* 236* 180* 227*  BUN 30* 31* 35* 32* 38*  CREATININE 0.89 0.80 1.30* 0.78 0.83  CALCIUM 8.8* 8.9 8.8* 9.1 9.0  MG  --   --   --   --  2.1   GFR: Estimated Creatinine Clearance: 135.3 mL/min (by C-G formula based on SCr of 0.83 mg/dL). Liver Function Tests: No results for input(s): AST, ALT, ALKPHOS, BILITOT, PROT, ALBUMIN in the last 168 hours. No results for input(s): LIPASE, AMYLASE in the last 168 hours. No results for input(s): AMMONIA in the last 168 hours. Coagulation Profile: Recent Labs  Lab 06/30/18 1059 07/01/18 0854 07/02/18 1027 07/04/18 0815  INR 1.33 1.20 1.22 1.26   Cardiac Enzymes: Recent Labs  Lab 07/05/18 1111 07/05/18 1612 07/05/18 2219  TROPONINI <0.03 <0.03 <0.03   BNP (last 3 results) No results for input(s): PROBNP in the last 8760 hours. HbA1C: No results for input(s): HGBA1C in the last 72 hours. CBG: Recent Labs  Lab 07/05/18 2203 07/06/18 0725 07/06/18 1241 07/06/18 1722 07/06/18 2240  GLUCAP 251* 212* 180* 170* 244*   Lipid Profile: No results for input(s): CHOL, HDL, LDLCALC, TRIG, CHOLHDL, LDLDIRECT in the last 72 hours. Thyroid Function Tests: No results for input(s): TSH, T4TOTAL, FREET4, T3FREE, THYROIDAB in the last 72 hours. Anemia Panel: No results for input(s): VITAMINB12, FOLATE, FERRITIN, TIBC, IRON, RETICCTPCT in the last 72 hours. Sepsis Labs: No results for input(s):  PROCALCITON, LATICACIDVEN in the last 168 hours.  No results found for this or any previous visit (from the past 240 hour(s)).       Radiology Studies: [place] Ekg Site Rite  Result Date: 07/06/2018 If [facility] image not attached, placement could not be confirmed due to current cardiac rhythm.       Scheduled Meds: . dexamethasone  8 mg Intravenous Q6H  . docusate sodium  100 mg Oral Daily  . doxycycline  100 mg Oral Q12H  . febuxostat  40 mg Oral Daily  . gabapentin  300 mg Oral TID  . Influenza vac split quadrivalent PF  0.5 mL Intramuscular Tomorrow-1000  . insulin aspart  0-5 Units Subcutaneous QHS  . insulin aspart  0-9 Units Subcutaneous TID WC  . methocarbamol  750 mg Oral QID  . midodrine  5 mg Oral TID WC  . nystatin  5 mL Oral TID AC & HS  . pantoprazole  40 mg Oral Daily  . polyethylene glycol  17 g Oral BID  . senna-docusate  1 tablet Oral QHS  . sodium chloride flush  10-40 mL Intracatheter Q12H   Continuous Infusions: . bivalirudin (ANGIOMAX) infusion 0.5 mg/mL (Non-ACS indications) 0.15 mg/kg/hr (07/06/18 2340)  .  ceFAZolin (ANCEF) IV 2 g (07/07/18 0140)  . lactated ringers 10 mL/hr at 07/07/18 0812     LOS: 30 days    Time spent: 35 minutes.     Alba Cory, MD [facility] Pager [phone]  If 7PM-7AM, please contact night-coverage [email or web address] Password [facility] 07/07/2018, 9:19 AM

## 2018-07-07 NOTE — Anesthesia Preprocedure Evaluation (Addendum)
Anesthesia Evaluation  Patient identified by MRN, date of birth, ID band Patient awake    Reviewed: Allergy & Precautions, NPO status , Patient's Chart, lab work & pertinent test results  Airway Mallampati: II  TM Distance: >3 FB Neck ROM: Limited    Dental  (+) Teeth Intact, Dental Advisory Given   Pulmonary neg pulmonary ROS,    Pulmonary exam normal breath sounds clear to auscultation       Cardiovascular hypertension, Normal cardiovascular exam Rhythm:Regular Rate:Normal  Echo 03/27/18:  Study Conclusions  - Left ventricle: The cavity size was mildly dilated. Wall   thickness was normal. Systolic function was normal. The estimated ejection fraction was in the range of 50% to 55%. Wall motion was normal; there were no regional wall motion abnormalities. Features are consistent with a pseudonormal left ventricular filling pattern, with concomitant abnormal relaxation and increased filling pressure (grade 2 diastolic dysfunction).   Neuro/Psych PSYCHIATRIC DISORDERS Anxiety Cervical epidural hematoma Weakness L>R    GI/Hepatic negative GI ROS, Neg liver ROS,   Endo/Other  Morbid obesity  Renal/GU negative Renal ROS     Musculoskeletal  (+) Arthritis ,   Abdominal   Peds  Hematology  (+) Blood dyscrasia (Eliquis; Thrombocytopenia), anemia ,   Anesthesia Other Findings Day of surgery medications reviewed with the patient.  Reproductive/Obstetrics                            Anesthesia Physical Anesthesia Plan  ASA: III and emergent  Anesthesia Plan: General   Post-op Pain Management:    Induction: Intravenous and Rapid sequence  PONV Risk Score and Plan: 3 and Midazolam, Dexamethasone and Ondansetron  Airway Management Planned: Oral ETT and Video Laryngoscope Planned  Additional Equipment:   Intra-op Plan:   Post-operative Plan: Extubation in OR  Informed Consent: I have  reviewed the patients History and Physical, chart, labs and discussed the procedure including the risks, benefits and alternatives for the proposed anesthesia with the patient or authorized representative who has indicated his/her understanding and acceptance.   Dental advisory given  Plan Discussed with: CRNA  Anesthesia Plan Comments:         Anesthesia Quick Evaluation

## 2018-07-07 NOTE — Progress Notes (Signed)
Inpatient Diabetes Program Recommendations  AACE/ADA: New Consensus Statement on Inpatient Glycemic Control (2015)  Target Ranges:  Prepandial:   less than 140 mg/dL      Peak postprandial:   less than 180 mg/dL (1-2 hours)      Critically ill patients:  140 - 180 mg/dL   Lab Results  Component Value Date   GLUCAP 315 (H) 07/07/2018    Review of Glycemic ControlResults for AJDIN, MACKE (MRN [id number]) as of 07/07/2018 12:21  Ref. Range 07/06/2018 12:41 07/06/2018 17:22 07/06/2018 22:40 07/07/2018 07:28 07/07/2018 10:58  Glucose-Capillary Latest Ref Range: 70 - 99 mg/dL 213 (H) 086 (H) 578 (H) 246 (H) 315 (H)    Diabetes history: None Outpatient Diabetes medications: None Current orders for Inpatient glycemic control:  Novolog sensitive tid with meals and HS Inpatient Diabetes Program Recommendations:   May consider increasing Novolog correction to q 4 hours.  If blood sugars remain greater than 200 mg/dL, consider adding IV insulin instead to keep blood sugars with in hospital goal.   Thanks,  Beryl Meager, RN, BC-ADM Inpatient Diabetes Coordinator Pager [phone] (8a-5p)

## 2018-07-07 NOTE — Progress Notes (Signed)
Per report from third shift patient has been running 80s/90s which for him is tachy, patient normally lives in the high 40s and low to mid 2s. And requires orthostatic blood pressures. Now getting blood pressure in his right calf because patient has both midline and upper arm PICC x's 2.

## 2018-07-07 NOTE — Progress Notes (Signed)
NP Leo Grosser told Nurse that patient is going back to surgery. STAT order for CBC, PT and INR. Also to stop the angiomax.

## 2018-07-07 NOTE — Op Note (Signed)
Operative diagnosis: Posterior cervical wound hematoma with epidural extension  Postoperative diagnosis: Same  Procedure:  reexploration of posterior surgical cervical wound for evacuation of epidural hematoma  Surgeon: Jillyn Hidden Maryan Sivak  Asst.: Marikay Alar  Anesthesia: Gen.  EBL: Minimal  History of present illness:52 year old gentleman with long-standing history of bleeding coagulopathies who started experiencing earlier this morning numbness tingling weakness in his hands worse on the left consistent with previous episodes disease had that shown large reaccumulation of epidural blood in his posterior cervical wound. Patient's clinical exam was deteriorating we will recommended emergent OR for reexploration of posterior cervical wound for evacuation of hematoma. Extensive amount of the risks and benefits of the operation with him as well as perioperative course expectations of outcome and alternatives of surgery. Preoperative platelet count was 75 preoperative INR was 1.7 patient was transfused both platelets and FFP on arrival into the OR.  Operative procedure: Patient brought into the or was induced under general anesthesia positioned prone in pins back side of his head neck was prepped and draped in routine sterile fashion.is running nylon stitch was cut and after the old incision was then opened up immediately dark blood under pressure not came out upon opening of the subcutaneous layer. Opened up the fascia was a the moderate amount of epidural blood around the thecal sac this is aspirated and evacuated several small bleeders were coagulated and meticulous hemostasis was maintained again. The thecal sac was soft and easily ballotable. Gelfoam was opened up the dura a Hemovac drain was placed and the wounds and closed in layers with interrupted Vicryl and a running nylon locking in the skin. Wound was dressed patient recovered in stable condition. At the end the case all needle counts sponge counts  were correct.

## 2018-07-07 NOTE — Progress Notes (Signed)
Nurse called and gave report to Sam, Rn at [phone]. Nurse will call back to let me know if surgery will be postponed because patient had an ensure 237 ml this a.m with morning meds before NPO status was added for this upcoming surgery.

## 2018-07-07 NOTE — Progress Notes (Signed)
Dr. Sunnie Nielsen is on the unit and was getting ready to go and see patient. Dr. Sunnie Nielsen asked Nurse to contact Leo Grosser in order to discuss progression of patient and need for surgery.

## 2018-07-07 NOTE — Progress Notes (Signed)
OT Cancellation Note  Patient Details Name: Jarret Torre MRN: [id number] DOB: 1965-12-23   Cancelled Treatment:    Reason Eval/Treat Not Completed: Patient at procedure or test/ unavailable. Pt currently in surgery.  Ignacia Palma, OTR/L Acute Rehab Services Pager [phone] Office [phone]     Evette Georges 07/07/2018, 11:23 AM

## 2018-07-08 LAB — PREPARE FRESH FROZEN PLASMA: UNIT DIVISION: 0

## 2018-07-08 LAB — BASIC METABOLIC PANEL
Anion gap: 8 (ref 5–15)
BUN: 38 mg/dL — AB (ref 6–20)
CHLORIDE: 105 mmol/L (ref 98–111)
CO2: 27 mmol/L (ref 22–32)
CREATININE: 0.85 mg/dL (ref 0.61–1.24)
Calcium: 9.2 mg/dL (ref 8.9–10.3)
GFR calc Af Amer: >60 mL/min (ref >60)
GFR calc non Af Amer: >60 mL/min (ref >60)
GLUCOSE: 292 mg/dL — AB (ref 70–99)
POTASSIUM: 4.6 mmol/L (ref 3.5–5.1)
Sodium: 140 mmol/L (ref 135–145)

## 2018-07-08 LAB — CBC
HCT: 29.4 % — ABNORMAL LOW (ref 39.0–52.0)
Hemoglobin: 8.9 g/dL — ABNORMAL LOW (ref 13.0–17.0)
MCH: 29.9 pg (ref 26.0–34.0)
MCHC: 30.3 g/dL (ref 30.0–36.0)
MCV: 98.7 fL (ref 80.0–100.0)
Platelets: 64 K/uL — ABNORMAL LOW (ref 150–400)
RBC: 2.98 MIL/uL — ABNORMAL LOW (ref 4.22–5.81)
RDW: 16.8 % — ABNORMAL HIGH (ref 11.5–15.5)
WBC: 6.9 K/uL (ref 4.0–10.5)
nRBC: 0 % (ref 0.0–0.2)

## 2018-07-08 LAB — BPAM PLATELET PHERESIS
Blood Product Expiration Date: 201910252359
ISSUE DATE / TIME: 201910251236
UNIT TYPE AND RH: 5100

## 2018-07-08 LAB — PREPARE PLATELET PHERESIS: Unit division: 0

## 2018-07-08 LAB — BPAM FFP
Blood Product Expiration Date: 201910262359
Blood Product Expiration Date: 201910262359
ISSUE DATE / TIME: 201910251111
ISSUE DATE / TIME: 201910251111
UNIT TYPE AND RH: 6200
Unit Type and Rh: 6200

## 2018-07-08 LAB — APTT: aPTT: 24 s (ref 24–36)

## 2018-07-08 LAB — GLUCOSE, CAPILLARY
Glucose-Capillary: 248 mg/dL — ABNORMAL HIGH (ref 70–99)
Glucose-Capillary: 287 mg/dL — ABNORMAL HIGH (ref 70–99)
Glucose-Capillary: 255 mg/dL — ABNORMAL HIGH (ref 70–99)
Glucose-Capillary: 246 mg/dL — ABNORMAL HIGH (ref 70–99)

## 2018-07-08 NOTE — Progress Notes (Signed)
PROGRESS NOTE    [name] [name]  ZOX:[id number] DOB: [birth year] DOA: 06/06/2018 PCP: Devra Dopp, MD    Brief Narrative: Brief Narrative:  52 year old man with past medical history relevant for gout, hypertension anterior cervical corpectomy of C4 on 03/24/2018 with comp gated postoperative course including development of multiple PEs and DVTs started on anticoagulation and subsequently developed hematoma requiring reexploration on 03/28/2018 status post placement of IVC filter who was discharged to skilled nursing facility and had a complicated course including progressive lower extremity weakness, intermittent fevers of unclear etiology and hospitalizations for "pneumonia". Patient was admitted to this hospital is a transfer from [facility] with MRI of C-spine that showed postoperative changes and cord compression with cord edema and concern for ischemia. Patient is status post posterior cervical decompression laminectomy of C3, C4, C5, removal of spinous processes, foraminotomies on 06/14/2018. On 06/22/2018 patient began to develop worsening left sided paresthesias and weakness and a stat MRI showed evidence of postoperative hematoma. Patient was taken to the OR with evacuation of hematoma. Additionally his postoperative course has been complicated by recurrent fevers,bilateral lower extremity DVTs on ultrasound,migratory arthralgias/arthritis that are thought to be rheumatologic in nature as well as significant orthostasis limiting the amount he can work with physical therapy.  Noted to have decreased left hand grip on 10/19, MRI showed large epidural hematoma, patient s/p clot evacuation. Continues to have thrombocytopenia   Assessment & Plan:   Principal Problem:   Fever Active Problems:   Myelopathy (HCC)   Benign essential HTN   Cord compression (HCC)   History of pulmonary embolism   Spondylosis, cervical, with myelopathy   Labile blood pressure   Orthostatic  hypotension   Leukocytosis   Tachycardia-bradycardia syndrome (HCC)   Near syncope due to orthostatic hypotension Thought to be related to hypovolemia and autonomic dysfunction.  On midodrine and TED hose.  Echocardiogram July 2019 showed a normal systolic function, grade 2 diastolic dysfunction.  No significant valve abnormalities are present Evaluated by cardiology. Continue with midodrine. Abdominal binder ordered.  Stable.   Bradycardia;  Notice have lower HR 10-23. Asymptomatic.  Cardiology consulted, for both bradycardia, and syncope and orthostatic hypotension.  No indication for pacer. Thought argatroban might be causing bradycardia.  Stable.   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  -IV antibiotics per sx.  -Discussed with Dr Wynetta Emery hold anticoagulation for at least 1 week. If anticoagulation is going to be consider, this will need to be discussed with Dr Wynetta Emery. Patient has required multiples surgical evacuation of epidural hematoma. Difficult situation with patient recent thrombotic event.   Polyarthralgia/persistent fever -Patient had symptoms in the shoulders and ankles, along with a history of gout and pain in his knees.  He underwent arthrocentesis however no evidence of infection was found. -He was noted to have elevated inflammatory markers including CCP, CRP.  However ANCA, aldolase, ANA were negative. -Previous rounding physician discussed with Dr. Deanne Coffer, rheumatologist, he will follow-up with the patient as an outpatient. -Patient was placed  on prednisone however currently on dexamethasone (neurosurgery) Fever resolved./  Denies joint pain.   Thrombocytopenia/coagulopathy -Cardiolipin antibody negative -Lupus anticoagulant, homocysteine  level, factor VIII within normal limits -Protein S activity 60 (low) -Protein C total >200; Activity 199 (high)  -HIT panel 0.486 (mildly elevated)- SRA (serotonin release assay) negative -immature platelet fraction WNL -Was given 2u platelets on 10/19 in the OR  Platelet stable. And 2 units 10-25 for OR.    Recent PE /DVT in setting of spine surgery -Status post IVC filter placement on March 28, 2018 -Patient was on apixaban however changed over to IV heparin.  Anticoagulation however was discontinued for surgery.  Patient also noted to have thrombocytopenia. -Vascular DVT US 06/26/2018 showed bilateral lower extremity DVTs likely acute -Case was discussed with  vascular surgery, Dr. Myra Gianotti, patient is out of the 3 week window for treatment, he would have to get TPA, which is contraindicated in a bleeding patient.  -Bivalirudin discontinue by neurosurgery. Serotonin release assay negative.  -Discussed with Dr Wynetta Emery hold anticoagulation for at least 1 week. If anticoagulation is going to be consider, this will need to be discussed with Dr Wynetta Emery. Patient has required multiples surgical evacuation of epidural hematoma. Difficult situation with patient recent thrombotic event.    Normocytic anemia Monitor hb.  Stable.   Gout -Does not seem to be active at this time, continue Uloric   Mild acute renal failure; resolved.   HTN; amlodipine discontinue due to orthostatic hypotension and edema.   Gout;  Continue with uloric.   Anemia;  was given 1u PRBC on 10/19 in OR   DVT prophylaxis: argatroban.  Code Status: full code.  Family Communication: care discussed with patient,  Disposition Plan: remain in the hospital for treatment of Blood clot, monitor for development of hematoma.      Consultants:   Neurosurgery  Hematology  Cardiology    Procedures: On 10/2 1. posterior cervical decompressive laminectomy at C3, C4, C5 with removal of the spinous process and complete laminectomy and foraminotomies of the C3, C4, C5 nerve roots. 2. Posterior cervical fusion with lateral mass screws at C3-C4-C5 and pars screws at C2 lies in the globus ellipseLateral mass screw system 3. Posterior lateral arthrodesis C2-C5 utilizing locally harvested autograft mixed with vivigen  06/22/2018: Procedure reexploration posterior cervical wound for evacuation of epidural hematoma  06/26/2018 DVT ultrasound:Right: Findings consistent with acute deep vein thrombosis involving the right common femoral vein, right femoral vein, right popliteal vein, right posterior tibial vein, and right peroneal vein. Thrombosis extends proximally into external iliac vein.  Proximal iliac vein not visualized. Left: Findings consistent with acute deep vein thrombosis involving the left common femoral vein, left femoral vein, left popliteal vein, left posterior tibial vein, and left peroneal vein. Findings consistent with acute superficial vein thrombosis  involving the left great saphenous vein. Thrombosis extends proximally into external iliac vein. Proximal iliac vein not visualized.  07/01/18 Clot evacuation   RUE doppler 07/02/18    Antimicrobials:    Subjective: He is feeling ok, he is able to move a little better left hand.   Objective: Vitals:   07/07/18 2044 07/08/18 0000 07/08/18 0400 07/08/18 0727  BP: (!) 157/83 (!) 157/93 (!) 141/89 (!) 156/93  Pulse: 75 84 68 62  Resp: 17 20 12 11   Temp: 97.6 F (36.4 C) 98 F (36.7 C) 97.6 F (36.4 C) 97.6 F (36.4 C)  TempSrc: Axillary Oral  Oral  SpO2: 100% 100% 100% 100%  Weight:      Height:        Intake/Output Summary (Last 24 hours) at 07/08/2018 0981 Last data filed at  07/08/2018 0524 Gross per 24 hour  Intake  2107.13 ml  Output 1335 ml  Net 772.13 ml   Filed Weights   06/14/18 1151 06/22/18 1641 07/07/18 1000  Weight: 123.8 kg 123.8 kg 123.8 kg    Examination:  General exam: NAD Respiratory system: CTA Cardiovascular system: S 1, S 2 RRR Gastrointestinal system: BS present, soft,  Central nervous system: alert and oriented. Left side weakness Extremities: positive for edema Skin: No rashes.     Data Reviewed: I have personally reviewed following labs and imaging studies  CBC: Recent Labs  Lab 07/02/18 0612  07/05/18 0513 07/06/18 0411 07/07/18 0600 07/07/18 0935 07/08/18 0500  WBC 10.5   < > 10.4 8.0 7.2 6.9 6.9  NEUTROABS 9.5*  --   --   --   --   --   --   HGB 9.4*   < > 9.3* 9.6* 9.6* 10.2* 8.9*  HCT 29.0*   < > 30.4* 30.5* 31.0* 33.1* 29.4*  MCV 94.2   < > 97.7 96.8 97.8 98.8 98.7  PLT 76*   < > 76* 72* 80* 82* 64*   < > = values in this interval not displayed.   Basic Metabolic Panel: Recent Labs  Lab 07/03/18 0717 07/04/18 0433 07/05/18 0513 07/07/18 0935 07/08/18 0500  NA 138 139 139 138 140  K 4.8 4.5 4.7 4.3 4.6  CL 104 105 106 106 105  CO2 24 27 25 24 27   GLUCOSE 236* 180* 227* 308* 292*  BUN 35* 32* 38* 39* 38*  CREATININE 1.30* 0.78 0.83 0.94 0.85  CALCIUM 8.8* 9.1 9.0 8.9 9.2  MG  --   --  2.1  --   --    GFR: Estimated Creatinine Clearance: 132.1 mL/min (by C-G formula based on SCr of 0.85 mg/dL). Liver Function Tests: No results for input(s): AST, ALT, ALKPHOS, BILITOT, PROT, ALBUMIN in the last 168 hours. No results for input(s): LIPASE, AMYLASE in the last 168 hours. No results for input(s): AMMONIA in the last 168 hours. Coagulation Profile: Recent Labs  Lab 07/02/18 1027 07/04/18 0815 07/07/18 0935  INR 1.22 1.26 1.70   Cardiac Enzymes: Recent Labs  Lab 07/05/18 1111 07/05/18 1612 07/05/18 2219  TROPONINI <0.03 <0.03 <0.03   BNP (last 3 results) No results for input(s): PROBNP in the last 8760 hours. HbA1C: No results  for input(s): HGBA1C in the last 72 hours. CBG: Recent Labs  Lab 07/07/18 0728 07/07/18 1058 07/07/18 1550 07/07/18 2133 07/08/18 0739  GLUCAP 246* 315* 241* 300* 248*   Lipid Profile: No results for input(s): CHOL, HDL, LDLCALC, TRIG, CHOLHDL, LDLDIRECT in the last 72 hours. Thyroid Function Tests: No results for input(s): TSH, T4TOTAL, FREET4, T3FREE, THYROIDAB in the last 72 hours. Anemia Panel: No results for input(s): VITAMINB12, FOLATE, FERRITIN, TIBC, IRON, RETICCTPCT in the last 72 hours. Sepsis Labs: No results for input(s): PROCALCITON, LATICACIDVEN in the last 168 hours.  No results found for this or any previous visit (from the past 240 hour(s)).       Radiology Studies: [place] Ekg Site Rite  Result Date: 07/06/2018 If [facility] image not attached, placement could not be confirmed due to current cardiac rhythm.       Scheduled Meds: . sodium chloride   Intravenous Once  . dexamethasone  8 mg Intravenous Q6H  . docusate sodium  100 mg Oral Daily  . doxycycline  100 mg Oral Q12H  . febuxostat  40 mg Oral Daily  .  gabapentin  300 mg Oral TID  . Gerhardt's butt cream   Topical BID  . Influenza vac split quadrivalent PF  0.5 mL Intramuscular Tomorrow-1000  . insulin aspart  0-5 Units Subcutaneous QHS  . insulin aspart  0-9 Units Subcutaneous TID WC  . methocarbamol  750 mg Oral QID  . midodrine  5 mg Oral TID WC  . nystatin  5 mL Oral TID AC & HS  . pantoprazole  40 mg Oral Daily  . polyethylene glycol  17 g Oral BID  . senna-docusate  1 tablet Oral QHS  . sodium chloride flush  10-40 mL Intracatheter Q12H   Continuous Infusions: .  ceFAZolin (ANCEF) IV 2 g (07/08/18 0855)  . lactated ringers 10 mL/hr at 07/07/18 0812  . lactated ringers 10 mL/hr at 07/07/18 1535     LOS: 31 days    Time spent: 35 minutes.     Alba Cory, MD [facility] Pager [phone]  If 7PM-7AM, please contact night-coverage [email or web address] Password  [email or web address] 07/08/2018, 9:08 AM

## 2018-07-08 NOTE — Progress Notes (Signed)
Subjective: Patient reports some mild neck pain but had a pretty good night overall. Denies any improvement in his strength.   Objective: Vital signs in last 24 hours: Temp:  [97.6 F (36.4 C)-98 F (36.7 C)] 97.6 F (36.4 C) (10/26 0727) Pulse Rate:  [62-84] 62 (10/26 0727) Resp:  [11-20] 11 (10/26 0727) BP: (141-182)/(78-96) 156/93 (10/26 0727) SpO2:  [100 %] 100 % (10/26 0727) Weight:  [123.8 kg] 123.8 kg (10/25 1000)  Intake/Output from previous day: 10/25 0701 - 10/26 0700 In: 4047.2 [P.O.:480; I.V.:909.1; Blood:658; IV Piggyback:2000.1] Out: 2061 [Urine:2000; Drains:35; Stool:1; Blood:25] Intake/Output this shift: No intake/output data recorded.  Neurologic: LUE 4-/5, LLE 2/5  Lab Results: Lab Results  Component Value Date   WBC 6.9 07/08/2018   HGB 8.9 (L) 07/08/2018   HCT 29.4 (L) 07/08/2018   MCV 98.7 07/08/2018   PLT 64 (L) 07/08/2018   Lab Results  Component Value Date   INR 1.70 07/07/2018   BMET Lab Results  Component Value Date   NA 140 07/08/2018   K 4.6 07/08/2018   CL 105 07/08/2018   CO2 27 07/08/2018   GLUCOSE 292 (H) 07/08/2018   BUN 38 (H) 07/08/2018   CREATININE 0.85 07/08/2018   CALCIUM 9.2 07/08/2018    Studies/Results: [place] Ekg Site Rite  Result Date: 07/06/2018 If Site Rite image not attached, placement could not be confirmed due to current cardiac rhythm.   Assessment/Plan: Status post reexploration of wound for epidural hematoma. Patient is doing ok today. Seems in good spirits. No improvement in left sided strength for now. Please keep patient off of blood thinners. Will work with therapies today.    LOS: 31 days    Tiana Loft [facility] 07/08/2018, 8:31 AM

## 2018-07-08 NOTE — Progress Notes (Signed)
Dr Fabio Bering rounding note reviewed. Telemetry reviewed, no significant bradycardic episodes. No additional cardiology recs over the weekend, please call with questions. COntinue current midodrine dose, significant supine HTN at times, would not increase dosing at this time.    Dominga Ferry MD

## 2018-07-09 LAB — CBC
HCT: 31.5 % — ABNORMAL LOW (ref 39.0–52.0)
HEMOGLOBIN: 9.6 g/dL — AB (ref 13.0–17.0)
MCH: 30.1 pg (ref 26.0–34.0)
MCHC: 30.5 g/dL (ref 30.0–36.0)
MCV: 98.7 fL (ref 80.0–100.0)
Platelets: 68 10*3/uL — ABNORMAL LOW (ref 150–400)
RBC: 3.19 MIL/uL — AB (ref 4.22–5.81)
RDW: 16.8 % — ABNORMAL HIGH (ref 11.5–15.5)
WBC: 6.9 10*3/uL (ref 4.0–10.5)
nRBC: 0 % (ref 0.0–0.2)

## 2018-07-09 LAB — GLUCOSE, CAPILLARY
GLUCOSE-CAPILLARY: 282 mg/dL — AB (ref 70–99)
GLUCOSE-CAPILLARY: 328 mg/dL — AB (ref 70–99)
GLUCOSE-CAPILLARY: 296 mg/dL — AB (ref 70–99)
Glucose-Capillary: 262 mg/dL — ABNORMAL HIGH (ref 70–99)

## 2018-07-09 LAB — CBC WITH DIFFERENTIAL/PLATELET
Abs Immature Granulocytes: 0.07 10*3/uL (ref 0.00–0.07)
BASOS ABS: 0 10*3/uL (ref 0.0–0.1)
Basophils Relative: 0 %
EOS ABS: 0.2 10*3/uL (ref 0.0–0.5)
EOS PCT: 3 %
HEMATOCRIT: 31.7 % — AB (ref 39.0–52.0)
Hemoglobin: 9.6 g/dL — ABNORMAL LOW (ref 13.0–17.0)
IMMATURE GRANULOCYTES: 1 %
LYMPHS ABS: 0.3 10*3/uL — AB (ref 0.7–4.0)
LYMPHS PCT: 5 %
MCH: 30 pg (ref 26.0–34.0)
MCHC: 30.3 g/dL (ref 30.0–36.0)
MCV: 99.1 fL (ref 80.0–100.0)
Monocytes Absolute: 0.2 10*3/uL (ref 0.1–1.0)
Monocytes Relative: 3 %
NRBC: 0 % (ref 0.0–0.2)
Neutro Abs: 5.7 10*3/uL (ref 1.7–7.7)
Neutrophils Relative %: 88 %
Platelets: 60 10*3/uL — ABNORMAL LOW (ref 150–400)
RBC: 3.2 MIL/uL — ABNORMAL LOW (ref 4.22–5.81)
RDW: 16.8 % — AB (ref 11.5–15.5)
WBC: 6.5 10*3/uL (ref 4.0–10.5)

## 2018-07-09 LAB — BASIC METABOLIC PANEL
Anion gap: 6 (ref 5–15)
BUN: 41 mg/dL — AB (ref 6–20)
CHLORIDE: 106 mmol/L (ref 98–111)
CO2: 26 mmol/L (ref 22–32)
Calcium: 9.1 mg/dL (ref 8.9–10.3)
Creatinine, Ser: 0.9 mg/dL (ref 0.61–1.24)
GFR calc Af Amer: >60 mL/min (ref >60)
GFR calc non Af Amer: >60 mL/min (ref >60)
GLUCOSE: 315 mg/dL — AB (ref 70–99)
POTASSIUM: 4.7 mmol/L (ref 3.5–5.1)
SODIUM: 138 mmol/L (ref 135–145)

## 2018-07-09 LAB — PROTIME-INR
INR: 1.33
PROTHROMBIN TIME: 16.3 s — AB (ref 11.4–15.2)

## 2018-07-09 LAB — APTT: aPTT: 23 seconds — ABNORMAL LOW (ref 24–36)

## 2018-07-09 MED ORDER — HYDRALAZINE HCL 20 MG/ML IJ SOLN: 5.0000 mg | Freq: Three times a day (TID) | INTRAMUSCULAR | Status: DC | PRN

## 2018-07-09 MED ORDER — METOPROLOL TARTRATE 5 MG/5ML IV SOLN
5.0000 mg | INTRAVENOUS | Status: AC | PRN
  Administered 2018-07-09 (×2): 5 mg via INTRAVENOUS
  Filled 2018-07-09 (×2): qty 5

## 2018-07-09 MED ORDER — ENALAPRILAT 1.25 MG/ML IV SOLN
1.2500 mg | Freq: Once | INTRAVENOUS | Status: DC
  Filled 2018-07-09: qty 1

## 2018-07-09 MED ORDER — INSULIN GLARGINE 100 UNIT/ML ~~LOC~~ SOLN
10.0000 [IU] | Freq: Every day | SUBCUTANEOUS | Status: DC
  Administered 2018-07-09: 10 [IU] via SUBCUTANEOUS
  Filled 2018-07-09 (×2): qty 0.1

## 2018-07-09 MED ORDER — FUROSEMIDE 10 MG/ML IJ SOLN
20.0000 mg | Freq: Once | INTRAMUSCULAR | Status: AC
  Administered 2018-07-09: 20 mg via INTRAVENOUS
  Filled 2018-07-09: qty 2

## 2018-07-09 NOTE — Progress Notes (Signed)
Pharmacy Antibiotic Note  Om Mancil is a 52 y.o. male  Pharmacy has been consulted for Cefazolin dosing while neck drain in place.  Also on oral Doxycycline.  Renal function remains stable with SCr 0.9 today.  Decreased output from drain this weekend. Plan: Continue cefazolin 2 gm IV q8hrs F/u discontinuation of abx when drain removed  Height: 5\' 9"  (175.3 cm) Weight: 272 lb 14.9 oz (123.8 kg) IBW/kg (Calculated) : 70.7  Temp (24hrs), Avg:98.3 F (36.8 C), Min:98.3 F (36.8 C), Max:98.4 F (36.9 C)  Recent Labs  Lab 07/04/18 0433 07/05/18 0513  07/07/18 0600 07/07/18 0935 07/08/18 0500 07/09/18 0515 07/09/18 0834  WBC 12.6* 10.4   < > 7.2 6.9 6.9 6.9 6.5  CREATININE 0.78 0.83  --   --  0.94 0.85 0.90  --    < > = values in this interval not displayed.    Estimated Creatinine Clearance: 124.8 mL/min (by C-G formula based on SCr of 0.9 mg/dL).    Allergies  Allergen Reactions  . Nsaids Other (See Comments)    G6PD DEFICIENCY  . Sulfa Antibiotics Other (See Comments)    G6PD DEFICIENCY  . Protonix [Pantoprazole Sodium] Diarrhea    Severe diarrhea, Upset stomach    Antimicrobials this admission: Fluconazole 10/15 >> 10/22 Nystatin oral 10/15 >> Doxycycline PO 10/18>> Cefazolin 10/24>>  Dose adjustments this admission:  n/a  Microbiology results: 9/25 Urine - <10 K/ml, insignificant growth 9/25 Blood x 2  - negative 9/26 Blood x 2 - negative 9/27 right knee synovial fluid - negative 10/2 MRSA PCR - negative 10/2 surgical MRSA PCR: postive for Staph but negative for MRSA 10/2 MRSA PCR - negative  Daylene Posey, PharmD Clinical Pharmacist Please check AMION for all [facility] Pharmacy numbers 07/09/2018 12:08 PM

## 2018-07-09 NOTE — Progress Notes (Signed)
Telemetry reviewed, normal sinus rhythm without any significant brady episodes. No additonal recs over the weekend, call with questions.    Dominga Ferry MD

## 2018-07-09 NOTE — Progress Notes (Signed)
PROGRESS NOTE    [name] [name]  ZOX:[id number] DOB: October 01, 1965 DOA: 06/06/2018 PCP: Devra Dopp, MD    Brief Narrative: Brief Narrative:  52 year old man with past medical history relevant for gout, hypertension anterior cervical corpectomy of C4 on 03/24/2018 with comp gated postoperative course including development of multiple PEs and DVTs started on anticoagulation and subsequently developed hematoma requiring reexploration on 03/28/2018 status post placement of IVC filter who was discharged to skilled nursing facility and had a complicated course including progressive lower extremity weakness, intermittent fevers of unclear etiology and hospitalizations for "pneumonia". Patient was admitted to this hospital is a transfer from [facility], [facility], [facility] with MRI of C-spine that showed postoperative changes and cord compression with cord edema and concern for ischemia. Patient is status post posterior cervical decompression laminectomy of C3, C4, C5, removal of spinous processes, foraminotomies on 06/14/2018. On 06/22/2018 patient began to develop worsening left sided paresthesias and weakness and a stat MRI showed evidence of postoperative hematoma. Patient was taken to the OR with evacuation of hematoma. Additionally his postoperative course has been complicated by recurrent fevers,bilateral lower extremity DVTs on ultrasound,migratory arthralgias/arthritis that are thought to be rheumatologic in nature as well as significant orthostasis limiting the amount he can work with physical therapy.  Noted to have decreased left hand grip on 10/19, MRI showed large epidural hematoma, patient s/p clot evacuation. Continues to have thrombocytopenia   Assessment & Plan:   Principal Problem:   Fever Active Problems:   Myelopathy (HCC)   Benign essential HTN   Cord compression (HCC)   History of pulmonary embolism   Spondylosis, cervical, with myelopathy   Labile blood pressure   Orthostatic  hypotension   Leukocytosis   Tachycardia-bradycardia syndrome (HCC)   Near syncope due to orthostatic hypotension Thought to be related to hypovolemia and autonomic dysfunction.  On midodrine and TED hose.  Echocardiogram July 2019 showed a normal systolic function, grade 2 diastolic dysfunction.  No significant valve abnormalities are present Evaluated by cardiology. Continue with midodrine. Abdominal binder ordered.  Stable.   Bradycardia;  Notice have lower HR 10-23. Asymptomatic.  Cardiology consulted, for both bradycardia, and syncope and orthostatic hypotension.  No indication for pacer. Thought argatroban might be causing bradycardia.  Stable.   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  -IV antibiotics per sx.  -Discussed with Dr Wynetta Emery hold anticoagulation for at least 1 week. If anticoagulation is going to be consider, this will need to be discussed with Dr Wynetta Emery. Patient has required multiples surgical evacuation of epidural hematoma. Difficult situation with patient recent thrombotic event.   Polyarthralgia/persistent fever -Patient had symptoms in the shoulders and ankles, along with a history of gout and pain in his knees.  He underwent arthrocentesis however no evidence of infection was found. -He was noted to have elevated inflammatory markers including CCP, CRP.  However ANCA, aldolase, ANA were negative. -Previous rounding physician discussed with Dr. Deanne Coffer, rheumatologist, he will follow-up with the patient as an outpatient. -Patient was placed  on prednisone however currently on dexamethasone (neurosurgery) Fever resolved./  Denies joint pain.   Thrombocytopenia/coagulopathy -Cardiolipin antibody negative -Lupus anticoagulant, homocysteine  level, factor VIII within normal limits -Protein S activity 60 (low) -Protein C total >200; Activity 199 (high)  -HIT panel 0.486 (mildly elevated)- SRA (serotonin release assay) negative -immature platelet fraction WNL -Was given 2u platelets on 10/19 in the OR  Platelet stable. And 2 units 10-25 for OR.    Recent PE /DVT in setting of spine surgery -Status post IVC filter placement on March 28, 2018 -Patient was on apixaban however changed over to IV heparin.  Anticoagulation however was discontinued for surgery.  Patient also noted to have thrombocytopenia. -Vascular DVT US 06/26/2018 showed bilateral lower extremity DVTs likely acute -Case was discussed with  vascular surgery, Dr. Myra Gianotti, patient is out of the 3 week window for treatment, he would have to get TPA, which is contraindicated in a bleeding patient.  -Bivalirudin discontinue by neurosurgery. Serotonin release assay negative.  -Discussed with Dr Wynetta Emery hold anticoagulation for at least 1 week. If anticoagulation is going to be consider, this will need to be discussed with Dr Wynetta Emery. Patient has required multiples surgical evacuation of epidural hematoma. Difficult situation with patient recent thrombotic event.  Will give one time dose of lasix for LE edema.    Normocytic anemia Monitor hb.  Stable.   Gout -Does not seem to be active at this time, continue Uloric   Mild acute renal failure; resolved.   HTN; amlodipine discontinue due to orthostatic hypotension and edema.   Gout;  Continue with uloric.   Anemia;  was given 1u PRBC on 10/19 in OR   DVT prophylaxis: argatroban.  Code Status: full code.  Family Communication: care discussed with patient,  Disposition Plan: remain in the hospital for treatment of  Blood clot, monitor for development of hematoma.     Consultants:   Neurosurgery  Hematology  Cardiology    Procedures: On 10/2 1. posterior cervical decompressive laminectomy at C3, C4, C5 with removal of the spinous process and complete laminectomy and foraminotomies of the C3, C4, C5 nerve roots. 2. Posterior cervical fusion with lateral mass screws at C3-C4-C5 and pars screws at C2 lies in the globus ellipseLateral mass screw system 3. Posterior lateral arthrodesis C2-C5 utilizing locally harvested autograft mixed with vivigen  06/22/2018: Procedure reexploration posterior cervical wound for evacuation of epidural hematoma  06/26/2018 DVT ultrasound:Right: Findings consistent with acute deep vein thrombosis involving the right common femoral vein, right femoral vein, right popliteal vein, right posterior tibial vein, and right peroneal vein. Thrombosis extends proximally into external iliac vein.  Proximal iliac vein not visualized. Left: Findings consistent with acute deep vein thrombosis involving the left common femoral vein, left femoral vein, left popliteal vein, left posterior tibial vein, and left peroneal vein. Findings consistent with acute superficial vein thrombosis  involving the left great saphenous vein. Thrombosis extends proximally into external iliac vein. Proximal iliac vein not visualized.  07/01/18 Clot evacuation   RUE doppler 07/02/18    Antimicrobials:    Subjective: Report weakness left arm improving slowly.  Report LE edema.   Objective: Vitals:   07/09/18 0429 07/09/18 0439 07/09/18 0500 07/09/18 0709  BP: (!) 172/106 (!) 158/93 (!) 148/93 (!) 146/90  Pulse: 75 78 81 78  Resp: (!) 26 18 18 16   Temp:    98.3 F (36.8 C)  TempSrc:    Oral  SpO2: 99% 99% 98% 99%  Weight:      Height:        Intake/Output Summary (Last 24 hours) at 07/09/2018 0959 Last data filed at 07/09/2018  0800 Gross per 24 hour  Intake 903.22 ml  Output  1400 ml  Net -496.78 ml   Filed Weights   06/14/18 1151 06/22/18 1641 07/07/18 1000  Weight: 123.8 kg 123.8 kg 123.8 kg    Examination:  General exam: NAD Respiratory system: CTA Cardiovascular system: S 1, S 2 RRR Gastrointestinal system: BS present, soft, nt Central nervous system: alert and oriented. Left side weakness Extremities: Bilateral LE edema Skin: No rashes.     Data Reviewed: I have personally reviewed following labs and imaging studies  CBC: Recent Labs  Lab 07/07/18 0600 07/07/18 0935 07/08/18 0500 07/09/18 0515 07/09/18 0834  WBC 7.2 6.9 6.9 6.9 6.5  NEUTROABS  --   --   --   --  5.7  HGB 9.6* 10.2* 8.9* 9.6* 9.6*  HCT 31.0* 33.1* 29.4* 31.5* 31.7*  MCV 97.8 98.8 98.7 98.7 99.1  PLT 80* 82* 64* 68* 60*   Basic Metabolic Panel: Recent Labs  Lab 07/04/18 0433 07/05/18 0513 07/07/18 0935 07/08/18 0500 07/09/18 0515  NA 139 139 138 140 138  K 4.5 4.7 4.3 4.6 4.7  CL 105 106 106 105 106  CO2 27 25 24 27 26   GLUCOSE 180* 227* 308* 292* 315*  BUN 32* 38* 39* 38* 41*  CREATININE 0.78 0.83 0.94 0.85 0.90  CALCIUM 9.1 9.0 8.9 9.2 9.1  MG  --  2.1  --   --   --    GFR: Estimated Creatinine Clearance: 124.8 mL/min (by C-G formula based on SCr of 0.9 mg/dL). Liver Function Tests: No results for input(s): AST, ALT, ALKPHOS, BILITOT, PROT, ALBUMIN in the last 168 hours. No results for input(s): LIPASE, AMYLASE in the last 168 hours. No results for input(s): AMMONIA in the last 168 hours. Coagulation Profile: Recent Labs  Lab 07/02/18 1027 07/04/18 0815 07/07/18 0935 07/09/18 0834  INR 1.22 1.26 1.70 1.33   Cardiac Enzymes: Recent Labs  Lab 07/05/18 1111 07/05/18 1612 07/05/18 2219  TROPONINI <0.03 <0.03 <0.03   BNP (last 3 results) No results for input(s): PROBNP in the last 8760 hours. HbA1C: No results for input(s): HGBA1C in the last 72 hours. CBG: Recent Labs  Lab 07/07/18 2133 07/08/18 0739 07/08/18 1156 07/08/18 1717  07/08/18 2124  GLUCAP 300* 248* 287* 255* 246*   Lipid Profile: No results for input(s): CHOL, HDL, LDLCALC, TRIG, CHOLHDL, LDLDIRECT in the last 72 hours. Thyroid Function Tests: No results for input(s): TSH, T4TOTAL, FREET4, T3FREE, THYROIDAB in the last 72 hours. Anemia Panel: No results for input(s): VITAMINB12, FOLATE, FERRITIN, TIBC, IRON, RETICCTPCT in the last 72 hours. Sepsis Labs: No results for input(s): PROCALCITON, LATICACIDVEN in the last 168 hours.  No results found for this or any previous visit (from the past 240 hour(s)).       Radiology Studies: No results found.      Scheduled Meds: . sodium chloride   Intravenous Once  . dexamethasone  8 mg Intravenous Q6H  . docusate sodium  100 mg Oral Daily  . doxycycline  100 mg Oral Q12H  . enalaprilat  1.25 mg Intravenous Once  . febuxostat  40 mg Oral Daily  . furosemide  20 mg Intravenous Once  . gabapentin  300 mg Oral TID  . Gerhardt's butt cream   Topical BID  . Influenza vac split quadrivalent PF  0.5 mL Intramuscular Tomorrow-1000  . insulin aspart  0-5 Units Subcutaneous QHS  . insulin aspart  0-9 Units Subcutaneous TID WC  . insulin  glargine  10 Units Subcutaneous QHS  . methocarbamol  750 mg Oral QID  . midodrine  5 mg Oral TID WC  . nystatin  5 mL Oral TID AC & HS  . pantoprazole  40 mg Oral Daily  . polyethylene glycol  17 g Oral BID  . senna-docusate  1 tablet Oral QHS  . sodium chloride flush  10-40 mL Intracatheter Q12H   Continuous Infusions: .  ceFAZolin (ANCEF) IV 2 g (07/09/18 0845)  . lactated ringers 10 mL/hr at 07/07/18 0812  . lactated ringers Stopped (07/08/18 0115)     LOS: 32 days    Time spent: 35 minutes.     Alba Cory, MD [facility] Pager [phone]  If 7PM-7AM, please contact night-coverage [email or web address] Password [email or web address] 07/09/2018, 9:59 AM

## 2018-07-09 NOTE — Progress Notes (Signed)
NEUROSURGERY PROGRESS NOTE  Doing well. Complains of appropriate neck  Soreness with strenuous movements. No arm pain Feels like the strength in his arm and dexterity has slightly improved since yesterday Incision CDI  Temp:  [98.3 F (36.8 C)-98.4 F (36.9 C)] 98.3 F (36.8 C) (10/27 0709) Pulse Rate:  [72-99] 78 (10/27 0709) Resp:  [15-26] 16 (10/27 0709) BP: (146-173)/(90-107) 146/90 (10/27 0709) SpO2:  [98 %-100 %] 99 % (10/27 0709)  Plan: Continue therapies today  Sherryl Manges, NP 07/09/2018 8:28 AM

## 2018-07-10 ENCOUNTER — Inpatient Hospital Stay (HOSPITAL_COMMUNITY): Admitting: Anesthesiology

## 2018-07-10 ENCOUNTER — Encounter (HOSPITAL_COMMUNITY)

## 2018-07-10 ENCOUNTER — Inpatient Hospital Stay (HOSPITAL_COMMUNITY)

## 2018-07-10 ENCOUNTER — Encounter (HOSPITAL_COMMUNITY)
Admission: AD | Disposition: A | Discharge status: Rehabilitation Facility | Payer: Self-pay | Source: Other Acute Inpatient Hospital | Attending: Internal Medicine

## 2018-07-10 ENCOUNTER — Encounter (HOSPITAL_COMMUNITY): Payer: Self-pay | Admitting: Neurosurgery

## 2018-07-10 DIAGNOSIS — Z86718 Personal history of other venous thrombosis and embolism: Secondary | ICD-10-CM

## 2018-07-10 HISTORY — PX: HEMATOMA EVACUATION: SHX5118

## 2018-07-10 LAB — CBC
HEMATOCRIT: 31 % — AB (ref 39.0–52.0)
HEMOGLOBIN: 9.3 g/dL — AB (ref 13.0–17.0)
MCH: 29.5 pg (ref 26.0–34.0)
MCHC: 30 g/dL (ref 30.0–36.0)
MCV: 98.4 fL (ref 80.0–100.0)
Platelets: 66 10*3/uL — ABNORMAL LOW (ref 150–400)
RBC: 3.15 MIL/uL — AB (ref 4.22–5.81)
RDW: 16.8 % — ABNORMAL HIGH (ref 11.5–15.5)
WBC: 5.4 10*3/uL (ref 4.0–10.5)
nRBC: 0 % (ref 0.0–0.2)

## 2018-07-10 LAB — BASIC METABOLIC PANEL
Anion gap: 7 (ref 5–15)
BUN: 43 mg/dL — AB (ref 6–20)
CHLORIDE: 106 mmol/L (ref 98–111)
CO2: 26 mmol/L (ref 22–32)
Calcium: 9 mg/dL (ref 8.9–10.3)
Creatinine, Ser: 1.05 mg/dL (ref 0.61–1.24)
GFR calc non Af Amer: >60 mL/min (ref >60)
GLUCOSE: 296 mg/dL — AB (ref 70–99)
POTASSIUM: 4.4 mmol/L (ref 3.5–5.1)
SODIUM: 139 mmol/L (ref 135–145)

## 2018-07-10 LAB — APTT: aPTT: 24 seconds (ref 24–36)

## 2018-07-10 LAB — GLUCOSE, CAPILLARY
GLUCOSE-CAPILLARY: 288 mg/dL — AB (ref 70–99)
GLUCOSE-CAPILLARY: 242 mg/dL — AB (ref 70–99)
Glucose-Capillary: 278 mg/dL — ABNORMAL HIGH (ref 70–99)

## 2018-07-10 SURGERY — EVACUATION HEMATOMA
Anesthesia: General | Site: Neck

## 2018-07-10 MED ORDER — SODIUM CHLORIDE 0.9 % IV SOLN
INTRAVENOUS | Status: DC | PRN
  Administered 2018-07-10: 50 ug/min via INTRAVENOUS

## 2018-07-10 MED ORDER — LACTATED RINGERS IV SOLN
INTRAVENOUS | Status: DC | PRN
  Administered 2018-07-10: 22:00:00 via INTRAVENOUS

## 2018-07-10 MED ORDER — LIDOCAINE HCL (CARDIAC) PF 100 MG/5ML IV SOSY
PREFILLED_SYRINGE | INTRAVENOUS | Status: DC | PRN
  Administered 2018-07-10: 40 mg via INTRATRACHEAL

## 2018-07-10 MED ORDER — CEFAZOLIN SODIUM 1 G IJ SOLR
INTRAMUSCULAR | Status: AC
  Filled 2018-07-10: qty 20

## 2018-07-10 MED ORDER — THROMBIN 20000 UNITS EX SOLR
CUTANEOUS | Status: DC | PRN
  Administered 2018-07-10: 20 mL via TOPICAL

## 2018-07-10 MED ORDER — SUCCINYLCHOLINE CHLORIDE 20 MG/ML IJ SOLN
INTRAMUSCULAR | Status: DC | PRN
  Administered 2018-07-10: 120 mg via INTRAVENOUS

## 2018-07-10 MED ORDER — THROMBIN 5000 UNITS EX SOLR
CUTANEOUS | Status: AC
  Filled 2018-07-10: qty 5000

## 2018-07-10 MED ORDER — 0.9 % SODIUM CHLORIDE (POUR BTL) OPTIME
TOPICAL | Status: DC | PRN
  Administered 2018-07-10: 1000 mL

## 2018-07-10 MED ORDER — MIDAZOLAM HCL 5 MG/5ML IJ SOLN
INTRAMUSCULAR | Status: DC | PRN
  Administered 2018-07-10 (×2): 1 mg via INTRAVENOUS

## 2018-07-10 MED ORDER — ONDANSETRON HCL 4 MG/2ML IJ SOLN
INTRAMUSCULAR | Status: DC | PRN
  Administered 2018-07-10: 4 mg via INTRAVENOUS

## 2018-07-10 MED ORDER — SODIUM CHLORIDE 0.9 % IV SOLN
INTRAVENOUS | Status: DC | PRN
  Administered 2018-07-10: 500 mL

## 2018-07-10 MED ORDER — THROMBIN (RECOMBINANT) 20000 UNITS EX SOLR
CUTANEOUS | Status: AC
  Filled 2018-07-10: qty 20000

## 2018-07-10 MED ORDER — BACITRACIN ZINC 500 UNIT/GM EX OINT
TOPICAL_OINTMENT | CUTANEOUS | Status: DC | PRN
  Administered 2018-07-10: 1 via TOPICAL

## 2018-07-10 MED ORDER — MIDAZOLAM HCL 2 MG/2ML IJ SOLN
INTRAMUSCULAR | Status: AC
  Filled 2018-07-10: qty 2

## 2018-07-10 MED ORDER — SODIUM CHLORIDE 0.9 % IV SOLN
20.0000 ug | INTRAVENOUS | Status: AC
  Administered 2018-07-10: 20 ug via INTRAVENOUS
  Filled 2018-07-10: qty 5

## 2018-07-10 MED ORDER — FENTANYL CITRATE (PF) 100 MCG/2ML IJ SOLN
25.0000 ug | INTRAMUSCULAR | Status: DC | PRN
  Administered 2018-07-10: 50 ug via INTRAVENOUS

## 2018-07-10 MED ORDER — CEFAZOLIN SODIUM-DEXTROSE 2-3 GM-%(50ML) IV SOLR
INTRAVENOUS | Status: DC | PRN
  Administered 2018-07-10: 2 g via INTRAVENOUS

## 2018-07-10 MED ORDER — PROPOFOL 10 MG/ML IV BOLUS
INTRAVENOUS | Status: DC | PRN
  Administered 2018-07-10: 160 mg via INTRAVENOUS

## 2018-07-10 MED ORDER — FENTANYL CITRATE (PF) 100 MCG/2ML IJ SOLN
INTRAMUSCULAR | Status: AC
  Filled 2018-07-10: qty 2

## 2018-07-10 MED ORDER — PROPOFOL 10 MG/ML IV BOLUS
INTRAVENOUS | Status: AC
  Filled 2018-07-10: qty 20

## 2018-07-10 MED ORDER — PHENYLEPHRINE HCL 10 MG/ML IJ SOLN
INTRAMUSCULAR | Status: DC | PRN
  Administered 2018-07-10: 80 ug via INTRAVENOUS

## 2018-07-10 MED ORDER — FENTANYL CITRATE (PF) 250 MCG/5ML IJ SOLN
INTRAMUSCULAR | Status: DC | PRN
  Administered 2018-07-10: 50 ug via INTRAVENOUS

## 2018-07-10 MED ORDER — SODIUM CHLORIDE 0.9% IV SOLUTION: Freq: Once | INTRAVENOUS | Status: DC

## 2018-07-10 MED ORDER — EPHEDRINE SULFATE 50 MG/ML IJ SOLN
INTRAMUSCULAR | Status: DC | PRN
  Administered 2018-07-10: 10 mg via INTRAVENOUS

## 2018-07-10 MED ORDER — ONDANSETRON HCL 4 MG/2ML IJ SOLN: 4.0000 mg | Freq: Once | INTRAMUSCULAR | Status: DC | PRN

## 2018-07-10 MED ORDER — FENTANYL CITRATE (PF) 250 MCG/5ML IJ SOLN
INTRAMUSCULAR | Status: AC
  Filled 2018-07-10: qty 5

## 2018-07-10 MED ORDER — BACITRACIN ZINC 500 UNIT/GM EX OINT
TOPICAL_OINTMENT | CUTANEOUS | Status: AC
  Filled 2018-07-10: qty 28.35

## 2018-07-10 SURGICAL SUPPLY — 63 items
BLADE CLIPPER SURG (BLADE) ×2 IMPLANT
BNDG GAUZE ELAST 4 BULKY (GAUZE/BANDAGES/DRESSINGS) IMPLANT
BUR ACORN 9.0 PRECISION (BURR) ×2 IMPLANT
BUR MATCHSTICK NEURO 3.0 LAGG (BURR) IMPLANT
BUR SPIRAL ROUTER 2.3 (BUR) ×2 IMPLANT
CANISTER SUCT 3000ML PPV (MISCELLANEOUS) ×2 IMPLANT
CLIP VESOCCLUDE MED 6/CT (CLIP) IMPLANT
COVER WAND RF STERILE (DRAPES) ×2 IMPLANT
DRAPE NEUROLOGICAL W/INCISE (DRAPES) ×2 IMPLANT
DRAPE SHEET LG 3/4 BI-LAMINATE (DRAPES) ×2 IMPLANT
DRAPE SURG 17X23 STRL (DRAPES) IMPLANT
DRAPE WARM FLUID 44X44 (DRAPE) ×2 IMPLANT
DRSG OPSITE POSTOP 4X6 (GAUZE/BANDAGES/DRESSINGS) ×2 IMPLANT
DURAPREP 6ML APPLICATOR 50/CS (WOUND CARE) ×2 IMPLANT
ELECT REM PT RETURN 9FT ADLT (ELECTROSURGICAL) ×2
ELECTRODE REM PT RTRN 9FT ADLT (ELECTROSURGICAL) ×1 IMPLANT
EVACUATOR 1/8 PVC DRAIN (DRAIN) IMPLANT
EVACUATOR SILICONE 100CC (DRAIN) IMPLANT
FLOSEAL 5ML (HEMOSTASIS) ×2 IMPLANT
GAUZE 4X4 16PLY RFD (DISPOSABLE) IMPLANT
GAUZE SPONGE 4X4 12PLY STRL (GAUZE/BANDAGES/DRESSINGS) ×2 IMPLANT
GLOVE BIO SURGEON STRL SZ7 (GLOVE) ×2 IMPLANT
GLOVE BIO SURGEON STRL SZ7.5 (GLOVE) ×4 IMPLANT
GLOVE BIOGEL PI IND STRL 7.5 (GLOVE) ×3 IMPLANT
GLOVE BIOGEL PI INDICATOR 7.5 (GLOVE) ×3
GLOVE EXAM NITRILE LRG STRL (GLOVE) IMPLANT
GLOVE EXAM NITRILE XL STR (GLOVE) IMPLANT
GLOVE EXAM NITRILE XS STR PU (GLOVE) IMPLANT
GOWN STRL REUS W/ TWL LRG LVL3 (GOWN DISPOSABLE) ×2 IMPLANT
GOWN STRL REUS W/ TWL XL LVL3 (GOWN DISPOSABLE) IMPLANT
GOWN STRL REUS W/TWL 2XL LVL3 (GOWN DISPOSABLE) IMPLANT
GOWN STRL REUS W/TWL LRG LVL3 (GOWN DISPOSABLE) ×2
GOWN STRL REUS W/TWL XL LVL3 (GOWN DISPOSABLE)
HEMOSTAT SURGICEL 2X14 (HEMOSTASIS) IMPLANT
HOOK DURA 1/2IN (MISCELLANEOUS) ×2 IMPLANT
KIT BASIN OR (CUSTOM PROCEDURE TRAY) ×2 IMPLANT
KIT TURNOVER KIT B (KITS) ×2 IMPLANT
NEEDLE HYPO 22GX1.5 SAFETY (NEEDLE) ×2 IMPLANT
NS IRRIG 1000ML POUR BTL (IV SOLUTION) ×2 IMPLANT
PACK CRANIOTOMY CUSTOM (CUSTOM PROCEDURE TRAY) ×2 IMPLANT
PATTIES SURGICAL .5 X.5 (GAUZE/BANDAGES/DRESSINGS) IMPLANT
PATTIES SURGICAL .5 X3 (DISPOSABLE) IMPLANT
PATTIES SURGICAL 1X1 (DISPOSABLE) IMPLANT
SPONGE NEURO XRAY DETECT 1X3 (DISPOSABLE) IMPLANT
SPONGE SURGIFOAM ABS GEL 100 (HEMOSTASIS) ×2 IMPLANT
STAPLER VISISTAT 35W (STAPLE) ×2 IMPLANT
STOCKINETTE 6  STRL (DRAPES) ×1
STOCKINETTE 6 STRL (DRAPES) ×1 IMPLANT
SUT ETHILON 3 0 FSL (SUTURE) ×4 IMPLANT
SUT ETHILON 3 0 PS 1 (SUTURE) IMPLANT
SUT NURALON 4 0 TR CR/8 (SUTURE) ×6 IMPLANT
SUT STEEL 0 (SUTURE)
SUT STEEL 0 18XMFL TIE 17 (SUTURE) IMPLANT
SUT VIC AB 0 CT1 18XCR BRD8 (SUTURE) ×2 IMPLANT
SUT VIC AB 0 CT1 8-18 (SUTURE) ×2
SUT VIC AB 2-0 CP2 18 (SUTURE) ×4 IMPLANT
SUT VIC AB 3-0 SH 8-18 (SUTURE) ×4 IMPLANT
TOWEL GREEN STERILE (TOWEL DISPOSABLE) ×2 IMPLANT
TOWEL GREEN STERILE FF (TOWEL DISPOSABLE) ×2 IMPLANT
TRAY FOLEY MTR SLVR 16FR STAT (SET/KITS/TRAYS/PACK) ×2 IMPLANT
TUBE CONNECTING 12X1/4 (SUCTIONS) ×2 IMPLANT
UNDERPAD 30X30 (UNDERPADS AND DIAPERS) ×2 IMPLANT
WATER STERILE IRR 1000ML POUR (IV SOLUTION) ×2 IMPLANT

## 2018-07-10 NOTE — Consult Note (Signed)
Chief Complaint: Patient was seen in consultation today for Bilateral lower extremity venous thrombectomy procedure at the request of Dr Jari Favre   Supervising Physician: Ruel Favors  Patient Status: [facility] - In-pt  History of Present Illness: [name] [name] is a 52 y.o. male   Post C4 cervical corpectomy 03/2018 Complicated course development DVTs and PEs IVC filter placement 03/28/18 Discharged to SNF  Lower extr weakness and PNA  Additional cervical surgeries-- Post op changes included development cervical hematoma Evacuation of cervical hematoma 06/22/18 Decreased left hand grip Recent MRI 07/01/18:  IMPRESSION: 1. Severe thecal sac effacement with cord edema from C2-3 to C5-6 primarily related to tense posterior fluid collection with hematocrit levels. 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 2. C3-C5 anterior fusion with C4 corpectomy.  Now with new Bilat DVT Summary: Right: Findings consistent with acute deep vein thrombosis involving the right common femoral vein, right femoral vein, right popliteal vein, right posterior tibial vein, and right peroneal vein. Thrombosis extends proximally into external iliac vein.  Proximal iliac vein not visualized. Left: Findings consistent with acute deep vein thrombosis involving the left common femoral vein, left femoral vein, left popliteal vein, left posterior tibial vein, and left peroneal vein. Findings consistent with acute superficial vein thrombosis  involving the left great saphenous vein. Thrombosis extends proximally into external iliac vein. Proximal iliac vein not visualized.  MD has requested consideration of IR procedure  Venous thrombectomy Dr Miles Costain has reviewed imaging and chart Cannot anticoagulate secondary cervical hematomas and recent surgeries  I have seen and examined pt + edema Bilat lower extremities ++ pain per  pt. states he has numbness and tingling bilat in low legs  Will  set pt up for procedure at this time  Past Medical History:  Diagnosis Date  . Degenerative disc disease, lumbar   . Gout   . History of pernicious anemia 1980   Patient says he takes OTC iron supplements QD  . Hypertension     Past Surgical History:  Procedure Laterality Date  . ANTERIOR CERVICAL CORPECTOMY N/A 03/24/2018   Procedure: CERVICAL FOUR ANTERIOR CERVICAL CORPECTOMY;  Surgeon: Donalee Citrin, MD;  Location: [facility] OR;  Service: Neurosurgery;  Laterality: N/A;  . ANTERIOR CERVICAL DECOMP/DISCECTOMY FUSION N/A 03/28/2018   Procedure: Reexploration of anterior cervical wound;  Surgeon: Donalee Citrin, MD;  Location: [facility] OR;  Service: Neurosurgery;  Laterality: N/A;  . IR IVC FILTER PLMT / S&I Lenise Arena GUID/MOD SED  03/28/2018  . IR RADIOLOGIST EVAL & MGMT  05/31/2018  . MENISCUS REPAIR Left   . POSTERIOR CERVICAL FUSION/FORAMINOTOMY N/A 06/14/2018   Procedure: POSTERIOR CERVICAL DECOMPRESSION/FUSION CERVICAL TWO- CERVICAL THREE CERVICAL THREE- CERVICAL FOUR, CERVICAL FOUR- CERVICAL FIVE;  Surgeon: Donalee Citrin, MD;  Location: [facility] OR;  Service: Neurosurgery;  Laterality: N/A;  POSTERIOR CERVICAL DECOMPRESSION/FUSION CERVICAL TWO- CERVICAL THREE CERVICAL THREE- CERVICAL FOUR, CERVICAL FOUR- CERVICAL FIVE  . POSTERIOR CERVICAL LAMINECTOMY N/A 06/22/2018   Procedure: POSTERIOR CERVICAL LAMINECTOMY FOR EVACUATION OF HEMATOMA;  Surgeon: Donalee Citrin, MD;  Location: [facility] OR;  Service: Neurosurgery;  Laterality: N/A;  . POSTERIOR CERVICAL LAMINECTOMY N/A 07/07/2018   Procedure: Evacuation of Hematoma POSTERIOR CERVICAL;  Surgeon: Donalee Citrin, MD;  Location: [facility] OR;  Service: Neurosurgery;  Laterality: N/A;  Evacuation of Hematoma POSTERIOR CERVICAL  . POSTERIOR CERVICAL LAMINECTOMY FOR EPIDURAL ABSCESS N/A 07/01/2018   Procedure: EVACUATION OF POSTERIOR CERVICAL HEMATOMA;  Surgeon: Jadene Pierini, MD;  Location: [facility] OR;  Service: Neurosurgery;   Laterality: N/A;  . TENDON REPAIR     tricept tendon    Allergies: Nsaids; Sulfa antibiotics; and Protonix [pantoprazole sodium]  Medications: Prior to Admission medications   Medication Sig Start Date End Date Taking? Authorizing Provider  acetaminophen (TYLENOL) 325 MG tablet Take 2 tablets (650 mg total) by mouth every 4 (four) hours as needed for mild pain ((score 1 to 3) or temp > 100.5). 05/02/18  Yes Angiulli, Mcarthur Rossetti, PA-C  apixaban (ELIQUIS) 5 MG TABS tablet Take 1 tablet (5 mg total) by mouth 2 (two) times daily. 05/02/18  Yes Angiulli, Mcarthur Rossetti, PA-C  aspirin EC 81 MG EC tablet Take 1 tablet (81 mg total) by mouth daily. 05/02/18  Yes Angiulli, Mcarthur Rossetti, PA-C  baclofen (LIORESAL) 10 MG tablet Take 1 tablet (10 mg total) by mouth 2 (two) times daily as needed for muscle spasms. 05/02/18  Yes Angiulli, Mcarthur Rossetti, PA-C  colchicine 0.6 MG tablet Take 1 tablet (0.6 mg total) by mouth daily. 05/02/18  Yes Angiulli, Mcarthur Rossetti, PA-C  Cyanocobalamin 5000 MCG/ML LIQD Place 5,000 mcg under the tongue daily. Patient taking differently: Place 5,000 mcg under the tongue as needed.  05/02/18  Yes Angiulli, Mcarthur Rossetti, PA-C  hydrochlorothiazide (MICROZIDE) 12.5 MG capsule Take 1 capsule (12.5 mg total) by mouth daily. 05/02/18  Yes Angiulli, Mcarthur Rossetti, PA-C  oxyCODONE 10 MG TABS Take 1 tablet (10 mg total) by mouth every 3 (three) hours as needed for severe pain ((score 7 to 10)). 05/02/18  Yes Angiulli, Mcarthur Rossetti, PA-C  saccharomyces boulardii (FLORASTOR) 250 MG capsule Take 1 capsule (250 mg total) by mouth 2 (two) times daily. Patient not taking: Reported on 06/08/2018 05/02/18   Angiulli, Mcarthur Rossetti, PA-C     Family History  Family history unknown: Yes    Social History   Socioeconomic History  . Marital status: Legally Separated    Spouse name: Not on file  . Number of children: Not on file  . Years of education: Not on file  . Highest education level: Not on file  Occupational History  . Not on  file  Social Needs  . Financial resource strain: Not on file  . Food insecurity:    Worry: Not on file    Inability: Not on file  . Transportation needs:    Medical: Not on file    Non-medical: Not on file  Tobacco Use  . Smoking status: Never Smoker  . Smokeless tobacco: Never Used  Substance and Sexual Activity  . Alcohol use: Never    Frequency: Never  . Drug use: Never  . Sexual activity: Not Currently    Birth control/protection: Abstinence  Lifestyle  . Physical activity:    Days per week: Not on file    Minutes per session: Not on file  . Stress: Not on file  Relationships  . Social connections:    Talks on phone: Not on file    Gets together: Not on file    Attends religious service: Not on file    Active member of club or organization: Not on file    Attends meetings of clubs or organizations: Not on file    Relationship status: Not on file  Other Topics Concern  . Not on file  Social History Narrative  . Not on file    Review of Systems: A 12 point ROS discussed and pertinent positives are indicated in the HPI above.  All other systems are negative.  Review of Systems  Constitutional: Positive for activity change and fatigue. Negative for fever.  Respiratory: Negative for cough and shortness of breath.   Cardiovascular: Positive for leg swelling. Negative for chest pain.  Musculoskeletal: Positive for back pain and gait problem.  Neurological: Positive for weakness.  Psychiatric/Behavioral: Negative for behavioral problems and confusion.    Vital Signs: BP (!) 189/139   Pulse 96   Temp 98.2 F (36.8 C) (Oral)   Resp 13   Ht 5\' 9"  (1.753 m)   Wt 272 lb 14.9 oz (123.8 kg)   SpO2 99%   BMI 40.30 kg/m   Physical Exam  Cardiovascular: Normal rate, regular rhythm and normal heart sounds.  Pulmonary/Chest: Effort normal and breath sounds normal.  Abdominal: Soft. Bowel sounds are normal.  Musculoskeletal:  Able to move upper extremities Weak and  cannot use them fully  Lower extremity swelling Right sl worse than left Able to palpate 1+ pulse B  Tender to touch Rt leg Very little feeling in left Unable to move left leg at all   Vitals reviewed.   Imaging: Dg Cervical Spine 2 Or 3 Views  Result Date: 06/14/2018 CLINICAL DATA:  Posterior cervical spine fusion EXAM: CERVICAL SPINE - 2-3 VIEW COMPARISON:  CT cervical spine of 06/07/2017 FINDINGS: Two C-arm spot films were returned. These images show hardware placed for posterior fusion at C2-C3 C4 and by history C5 as well. The C4-5 area is not well seen on the C-arm images obtained. IMPRESSION: Hardware placed for posterior fusion from C2 to to C5 although the C5 level is not well seen. Electronically Signed   By: Dwyane Dee M.D.   On: 06/14/2018 16:06   Ct Chest W Contrast  Result Date: 06/24/2018 CLINICAL DATA:  52 year old male with history of deep venous thrombosis. Suspected pulmonary embolism. EXAM: CT CHEST, ABDOMEN, AND PELVIS WITH CONTRAST TECHNIQUE: Multidetector CT imaging of the chest, abdomen and pelvis was performed following the standard protocol during bolus administration of intravenous contrast. CONTRAST:  OMNIPAQUE IOHEXOL 300 MG/ML  SOLN COMPARISON:  Chest CT 03/27/2018. No prior CT the abdomen and pelvis. FINDINGS: CT CHEST FINDINGS Cardiovascular: Today's study was not tailored for evaluation of pulmonary embolism. With this limitation in mind, there is no central or lobar sized filling defects in the pulmonary arterial tree to suggest large pulmonary embolus. Heart size is normal. There is no significant pericardial fluid, thickening or pericardial calcification. No atherosclerotic calcifications noted in the thoracic aorta or the coronary arteries. Right upper extremity PICC with tip terminating in the distal superior vena cava. Mediastinum/Nodes: No pathologically enlarged mediastinal or hilar lymph nodes. Esophagus is unremarkable in appearance. No axillary  lymphadenopathy. Lungs/Pleura: Patchy multifocal peribronchovascular ground-glass attenuation is noted in the right lung, most evident in the right upper lobe posteriorly and in the basal segments of the right lower lobe, concerning for multilobar bronchopneumonia. No confluent consolidative airspace disease. No pleural effusions. No suspicious appearing pulmonary nodules or masses are noted. Some dependent atelectasis is also noted in the left lower lobe. Musculoskeletal: There are no aggressive appearing lytic or blastic lesions noted in the visualized portions of the skeleton. CT ABDOMEN PELVIS FINDINGS Hepatobiliary: No suspicious cystic or solid hepatic lesions. No intra or extrahepatic biliary ductal dilatation. Gallbladder is unremarkable in appearance. Pancreas: No pancreatic mass. No pancreatic ductal dilatation. No pancreatic or peripancreatic fluid or inflammatory changes. Spleen: Unremarkable. Adrenals/Urinary Tract: Bilateral kidneys and bilateral adrenal glands are normal in appearance. No hydroureteronephrosis. Small amount of gas  non dependently in the urinary bladder. Urinary bladder is otherwise unremarkable in appearance. Stomach/Bowel: Appearance of the stomach is normal. No pathologic dilatation of small bowel or colon. The appendix is not confidently identified and may be surgically absent. Regardless, there are no inflammatory changes noted adjacent to the cecum to suggest the presence of an acute appendicitis at this time. Vascular/Lymphatic: Mild atherosclerosis in the pelvic vasculature. IVC filter in position with tip terminating below the level of the renal veins. No lymphadenopathy noted in the abdomen or pelvis. Reproductive: Prostate gland and seminal vesicles are unremarkable in appearance. Other: A small umbilical hernia containing a small amount of omental fat. No significant volume of ascites. No pneumoperitoneum. Musculoskeletal: There are no aggressive appearing lytic or blastic  lesions noted in the visualized portions of the skeleton. IMPRESSION: 1. Patchy areas of peribronchovascular airspace consolidation in the right lung, compatible with mild multilobar bronchopneumonia. 2. Small amount of gas non dependently in the urinary bladder, nonspecific. Correlation with urinalysis is suggested, as the possibility of infection with gas-forming organisms is not excluded. Alternatively, this could be iatrogenic if there has been recent catheterization. 3. Small umbilical hernia containing a small amount of omental fat. No associated bowel incarceration or obstruction. 4. Additional incidental findings, as above. Electronically Signed   By: Trudie Reed M.D.   On: 06/24/2018 00:32   Ct Cervical Spine Wo Contrast  Result Date: 06/21/2018 CLINICAL DATA:  Neck pain after surgery. EXAM: CT CERVICAL SPINE WITHOUT CONTRAST TECHNIQUE: Multidetector CT imaging of the cervical spine was performed without intravenous contrast. Multiplanar CT image reconstructions were also generated. COMPARISON:  MRI cervical spine 06/07/2018. FINDINGS: Alignment: Straightening of the normal cervical lordosis. 2 mm anterolisthesis C2-3. Skull base and vertebrae: Status post C4 corpectomy, with C3-C5 ACDF. Satisfactory C4 cage placement, as well as C3 through C5 plate and screws. Additional posterior decompression, C3-C5, followed by lateral mass screws BILATERAL C3-C5 and BILATERAL C2 pars screws, connected by rods. Satisfactory posterior hardware placement. Hardware appears intact. Autograft material is noted bilaterally. Soft tissues and spinal canal: Residual osseous spurring and OPLL, most notable on the LEFT at lower C3 and upper C4. Central OPLL prominent behind C5. Disc levels:  Spondylosis at C5-6. Upper chest: Unremarkable. Other: None IMPRESSION: Posterior cervical decompression with lateral mass fusion, C2 through C5, as described above. Some residual OPLL at C3, C4, and C5, but with good decompression of  the spinal canal. Electronically Signed   By: Elsie Stain M.D.   On: 06/21/2018 12:41   Mr Cervical Spine Wo Contrast  Result Date: 07/01/2018 CLINICAL DATA:  Cord compression and edema. EXAM: MRI CERVICAL SPINE WITHOUT CONTRAST TECHNIQUE: Multiplanar, multisequence MR imaging of the cervical spine was performed. No intravenous contrast was administered. COMPARISON:  06/22/2018 FINDINGS: Alignment: Normal Vertebrae: Status post C3-C5 ACDF with C4 corpectomy. Decompression and instrumentation from C2-C5. No acute fracture or discitis. Cord: Compression with edema throughout the operative region, severe with thecal sac narrowing to 2 -3 mm. Posterior Fossa, vertebral arteries, paraspinal tissues: There is a large tense appearing fluid collection with hematocrit levels within the laminectomy defects, deep fascial space, and extending to the skin surface. Edema and intrinsic neck muscles, likely postoperative. Disc levels: C2-3: Negative C3-4: Residual left eccentric ossicles shin of the posterior longitudinal ligament. Left foraminal impingement. 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 Mild left foraminal narrowing C5-6: Disc narrowing and bulging. Mild uncovertebral spurring. Spinal stenosis from fluid collection exerting mass effect on the  dorsal thecal sac. C6-7: Unremarkable. C7-T1:Unremarkable. Results were called by telephone at the time of interpretation on 07/01/2018 at 8:28 pm to Dr. Maurice Small, who is already aware. IMPRESSION: 1. Severe thecal sac effacement with cord edema from C2-3 to C5-6 primarily related to tense posterior fluid collection with hematocrit levels. 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 2. C3-C5 anterior fusion with C4 corpectomy. Electronically Signed   By: Marnee Spring M.D.   On: 07/01/2018 20:29   Mr Cervical Spine Wo Contrast  Result  Date: 06/22/2018 CLINICAL DATA:  Initial evaluation for acute neck pain. EXAM: MRI CERVICAL SPINE WITHOUT CONTRAST TECHNIQUE: Multiplanar, multisequence MR imaging of the cervical spine was performed. No intravenous contrast was administered. COMPARISON:  Prior MRI from 06/07/2018 as well as CT from 06/21/2018. FINDINGS: Alignment: Straightening of the normal cervical lordosis. No listhesis or malalignment. Vertebrae: Postoperative changes from prior C4 corpectomy with C3 through C5 ACDF. Posterior decompression with fusion at C2 through C5. Vertebral body heights maintained. No acute or interval fracture. No discrete osseous lesions. Cord: Persistent expansile T2 hyperintensity within the cervical spinal cord extending from C4 through C6-7 (series 2, image 8). Signal intensity within the cervical spinal cord otherwise grossly within normal limits although evaluation limited by susceptibility artifact. Curvilinear posterior epidural collection extending from the posterior laminectomy site inferiorly measures up to approximately 4 mm in AP diameter (series 4, image 36). Posterior Fossa, vertebral arteries, paraspinal tissues: Visualized brain and posterior fossa within normal limits. Craniocervical junction normal. Postsurgical changes from recent posterior decompression with fusion present within the posterior paraspinous soft tissues. Small postoperative collection at the laminectomy site measures approximately 2.1 x 1.3 x 2.4 cm, although exact measurements difficult given adjacent susceptibility artifact. Normal intravascular flow voids present within the vertebral arteries bilaterally. Disc levels: C2-C3: Residual 5 x 7 mm left central disc osteophyte complex/OPLL extending to approximately C4-5 inferiorly, similar to previous. Persistent mild to moderate spinal stenosis, greater on the left, similar to previous. Spinal cord deviated to the right. Foramina remain patent. C3-C4: Left disc osteophyte complex/OPLL  with impingement of the left ventral spinal cord. Status post posterior decompression with fusion. Persistent severe spinal stenosis with impingement upon the left ventral spinal cord, worsened from previous. Spinal cord deviated to the right. Residual uncovertebral and facet hypertrophy with resultant moderate left with mild right foraminal stenosis, grossly stable. C4-C5: Broad-based left eccentric disc osteophyte complex flattens and largely effaces the ventral CSF. Interval posterior decompression with fusion. Persistent severe spinal stenosis, worsened from previous. Thecal sac measures 3 mm in AP diameter at its most narrow point on the left (series 4, image 17), previously 6 mm. Moderate to severe left with moderate right foraminal narrowing, grossly stable. C5-C6: Broad-based posterior disc osteophyte partially effaces the ventral thecal sac. Interval posterior decompression with fusion. Residual mild spinal stenosis, mildly improved from previous. Residual uncovertebral hypertrophy without significant foraminal stenosis, also improved from previous. C6-C7: Mild disc bulge with uncovertebral hypertrophy. Mild left C7 neural foraminal narrowing. No significant right foraminal stenosis. C7-T1: Mild bilateral facet hypertrophy. No significant canal or foraminal stenosis. Visualized upper thoracic spine demonstrates no other significant findings. IMPRESSION: 1. Postoperative changes from prior C4 corpectomy with ACDF at C3-C5, with posterior decompression and fusion at C2 through C5. Residual left-sided disc osteophyte/OPLL at C2-3 through C4-5 with persistent severe spinal stenosis at C3-4 and C4-5, worsened relative to previous MRI from 06/07/2018. 2. Abnormal cord signal intensity at C4  through C6-7, consistent with edema. 3. Multilevel neural foraminal narrowing at C2-3 through C6-7 as above, relatively similar as compared to previous exam. Electronically Signed   By: Rise Mu M.D.   On:  06/22/2018 16:46   Ct Abdomen Pelvis W Contrast  Result Date: 06/24/2018 CLINICAL DATA:  52 year old male with history of deep venous thrombosis. Suspected pulmonary embolism. EXAM: CT CHEST, ABDOMEN, AND PELVIS WITH CONTRAST TECHNIQUE: Multidetector CT imaging of the chest, abdomen and pelvis was performed following the standard protocol during bolus administration of intravenous contrast. CONTRAST:  OMNIPAQUE IOHEXOL 300 MG/ML  SOLN COMPARISON:  Chest CT 03/27/2018. No prior CT the abdomen and pelvis. FINDINGS: CT CHEST FINDINGS Cardiovascular: Today's study was not tailored for evaluation of pulmonary embolism. With this limitation in mind, there is no central or lobar sized filling defects in the pulmonary arterial tree to suggest large pulmonary embolus. Heart size is normal. There is no significant pericardial fluid, thickening or pericardial calcification. No atherosclerotic calcifications noted in the thoracic aorta or the coronary arteries. Right upper extremity PICC with tip terminating in the distal superior vena cava. Mediastinum/Nodes: No pathologically enlarged mediastinal or hilar lymph nodes. Esophagus is unremarkable in appearance. No axillary lymphadenopathy. Lungs/Pleura: Patchy multifocal peribronchovascular ground-glass attenuation is noted in the right lung, most evident in the right upper lobe posteriorly and in the basal segments of the right lower lobe, concerning for multilobar bronchopneumonia. No confluent consolidative airspace disease. No pleural effusions. No suspicious appearing pulmonary nodules or masses are noted. Some dependent atelectasis is also noted in the left lower lobe. Musculoskeletal: There are no aggressive appearing lytic or blastic lesions noted in the visualized portions of the skeleton. CT ABDOMEN PELVIS FINDINGS Hepatobiliary: No suspicious cystic or solid hepatic lesions. No intra or extrahepatic biliary ductal dilatation. Gallbladder is unremarkable in  appearance. Pancreas: No pancreatic mass. No pancreatic ductal dilatation. No pancreatic or peripancreatic fluid or inflammatory changes. Spleen: Unremarkable. Adrenals/Urinary Tract: Bilateral kidneys and bilateral adrenal glands are normal in appearance. No hydroureteronephrosis. Small amount of gas non dependently in the urinary bladder. Urinary bladder is otherwise unremarkable in appearance. Stomach/Bowel: Appearance of the stomach is normal. No pathologic dilatation of small bowel or colon. The appendix is not confidently identified and may be surgically absent. Regardless, there are no inflammatory changes noted adjacent to the cecum to suggest the presence of an acute appendicitis at this time. Vascular/Lymphatic: Mild atherosclerosis in the pelvic vasculature. IVC filter in position with tip terminating below the level of the renal veins. No lymphadenopathy noted in the abdomen or pelvis. Reproductive: Prostate gland and seminal vesicles are unremarkable in appearance. Other: A small umbilical hernia containing a small amount of omental fat. No significant volume of ascites. No pneumoperitoneum. Musculoskeletal: There are no aggressive appearing lytic or blastic lesions noted in the visualized portions of the skeleton. IMPRESSION: 1. Patchy areas of peribronchovascular airspace consolidation in the right lung, compatible with mild multilobar bronchopneumonia. 2. Small amount of gas non dependently in the urinary bladder, nonspecific. Correlation with urinalysis is suggested, as the possibility of infection with gas-forming organisms is not excluded. Alternatively, this could be iatrogenic if there has been recent catheterization. 3. Small umbilical hernia containing a small amount of omental fat. No associated bowel incarceration or obstruction. 4. Additional incidental findings, as above. Electronically Signed   By: Trudie Reed M.D.   On: 06/24/2018 00:32   Dg Chest Port 1 View  Result Date:  06/19/2018 CLINICAL DATA:  COUGH,HX DVT and PE AFTER  CERVICAL SURGERY EXAM: PORTABLE CHEST 1 VIEW COMPARISON:  04/26/2018 FINDINGS: The heart size and mediastinal contours are within normal limits. Both lungs are clear. The visualized skeletal structures are unremarkable. IMPRESSION: No active disease. Electronically Signed   By: Norva Pavlov M.D.   On: 06/19/2018 10:06   Dg C-arm 1-60 Min  Result Date: 06/14/2018 CLINICAL DATA:  Posterior fusion from C2-C5 EXAM: DG C-ARM 61-120 MIN COMPARISON:  CT C-spine of 06/07/2017 FINDINGS: C-arm fluoroscopy was provided during posterior fusion from C2-C5 by history. Fluoroscopy time of 40 seconds was recorded. IMPRESSION: C-arm fluoroscopy provided. Electronically Signed   By: Dwyane Dee M.D.   On: 06/14/2018 16:07   [place] Ekg Site Rite  Result Date: 07/06/2018 If Site Rite image not attached, placement could not be confirmed due to current cardiac rhythm.  [place] Ekg Site Rite  Result Date: 07/01/2018 If Site Rite image not attached, placement could not be confirmed due to current cardiac rhythm.  [place] Ekg Site Rite  Result Date: 07/01/2018 If Site Rite image not attached, placement could not be confirmed due to current cardiac rhythm.  [place] Ekg Site Rite  Result Date: 06/23/2018 If Site Rite image not attached, placement could not be confirmed due to current cardiac rhythm.   Labs:  CBC: Recent Labs    07/08/18 0500 07/09/18 0515 07/09/18 0834 07/10/18 0500  WBC 6.9 6.9 6.5 5.4  HGB 8.9* 9.6* 9.6* 9.3*  HCT 29.4* 31.5* 31.7* 31.0*  PLT 64* 68* 60* 66*    COAGS: Recent Labs    07/02/18 1027  07/04/18 0815  07/07/18 0935 07/08/18 0500 07/09/18 0515 07/09/18 0834 07/10/18 0500  INR 1.22  --  1.26  --  1.70  --   --  1.33  --   APTT  --    < >  --    < > 48* 24 23*  --  24   < > = values in this interval not displayed.    BMP: Recent Labs    07/07/18 0935 07/08/18 0500 07/09/18 0515 07/10/18 0747  NA 138 140 138 139  K  4.3 4.6 4.7 4.4  CL 106 105 106 106  CO2 24 27 26 26   GLUCOSE 308* 292* 315* 296*  BUN 39* 38* 41* 43*  CALCIUM 8.9 9.2 9.1 9.0  CREATININE 0.94 0.85 0.90 1.05  GFRNONAA >60 >60 >60 >60  GFRAA >60 >60 >60 >60    LIVER FUNCTION TESTS: Recent Labs    06/07/18 0021 06/12/18 0119 06/13/18 0500 06/22/18 0310  BILITOT 0.8 0.4 0.3 0.6  AST 26 32 55* 16  ALT 17 27 47* 21  ALKPHOS 71 77 71 58  PROT 6.5 6.1* 6.3* 5.7*  ALBUMIN 2.8* 2.6* 2.5* 2.8*    TUMOR MARKERS: No results for input(s): AFPTM, CEA, CA199, CHROMGRNA in the last 8760 hours.  Assessment and Plan:  Long hx post op cervical surgery hematoma; evacuation Residual numbness tingling Bilat LE Unable to use legs much at all Bilat LE DVT; swelling and painful Cannot anticoagulate Scheduled for lower extremity venous thrombectomy in IR today. Pt has good understanding of procedure benefits and risks including but not limited to Infection; bleeding; vessel damage; clot migration Agreeable to proceed Consent signed with pt X( secondary weakness of extremities) Also had parents on phone and answered all questions to satisfaction   Thank you for this interesting consult.  I greatly enjoyed meeting [name] [name] and look forward to participating in their care.  A copy of  this report was sent to the requesting provider on this date.  Electronically Signed: Robet Leu, PA-C 07/10/2018, 11:27 AM   I spent a total of 40 Minutes    in face to face in clinical consultation, greater than 50% of which was counseling/coordinating care for Bilat lower extremity venous thrombectomy in IR

## 2018-07-10 NOTE — Anesthesia Preprocedure Evaluation (Addendum)
Anesthesia Evaluation  Patient identified by MRN, date of birth, ID band Patient awake    Reviewed: Allergy & Precautions, NPO status , Patient's Chart, lab work & pertinent test results  Airway Mallampati: II  TM Distance: >3 FB Neck ROM: Limited    Dental  (+) Teeth Intact, Dental Advisory Given   Pulmonary neg pulmonary ROS,    Pulmonary exam normal breath sounds clear to auscultation       Cardiovascular hypertension, + DVT (bilateral LE DVT's s/p IVC filter. unable to anticoagulate 2/2 recurrent cervical hematomas.)  Normal cardiovascular exam Rhythm:Regular Rate:Normal  Echo 03/27/18:  Study Conclusions  - Left ventricle: The cavity size was mildly dilated. Wall   thickness was normal. Systolic function was normal. The estimated ejection fraction was in the range of 50% to 55%. Wall motion was normal; there were no regional wall motion abnormalities. Features are consistent with a pseudonormal left ventricular filling pattern, with concomitant abnormal relaxation and increased filling pressure (grade 2 diastolic dysfunction).   Neuro/Psych PSYCHIATRIC DISORDERS Anxiety Cervical epidural hematoma Weakness L>R    GI/Hepatic negative GI ROS, Neg liver ROS,   Endo/Other  Morbid obesity  Renal/GU negative Renal ROS     Musculoskeletal  (+) Arthritis ,   Abdominal   Peds  Hematology  (+) Blood dyscrasia (Eliquis; Thrombocytopenia), anemia ,   Anesthesia Other Findings Day of surgery medications reviewed with the patient.  Reproductive/Obstetrics                            Anesthesia Physical  Anesthesia Plan  ASA: III and emergent  Anesthesia Plan: General   Post-op Pain Management:    Induction: Intravenous and Rapid sequence  PONV Risk Score and Plan: 3 and Dexamethasone, Ondansetron and Treatment may vary due to age or medical condition  Airway Management Planned: Oral ETT and  Video Laryngoscope Planned  Additional Equipment:   Intra-op Plan:   Post-operative Plan: Extubation in OR and Possible Post-op intubation/ventilation  Informed Consent: I have reviewed the patients History and Physical, chart, labs and discussed the procedure including the risks, benefits and alternatives for the proposed anesthesia with the patient or authorized representative who has indicated his/her understanding and acceptance.   Dental advisory given  Plan Discussed with: CRNA, Anesthesiologist and Surgeon  Anesthesia Plan Comments:        Anesthesia Quick Evaluation

## 2018-07-10 NOTE — Progress Notes (Signed)
Subjective: Patient reports doing ok today, no worsening weakness in lower extremities  Objective: Vital signs in last 24 hours: Temp:  [98.1 F (36.7 C)-98.4 F (36.9 C)] 98.2 F (36.8 C) (10/28 1156) Pulse Rate:  [65-96] 88 (10/28 1156) Resp:  [12-22] 16 (10/28 1156) BP: (123-189)/(85-139) 123/85 (10/28 1156) SpO2:  [97 %-99 %] 97 % (10/28 1156)  Intake/Output from previous day: 10/27 0701 - 10/28 0700 In: 1045.3 [P.O.:840; IV Piggyback:205.3] Out: 2300 [Urine:2250; Drains:50] Intake/Output this shift: Total I/O In: 2642.3 [P.O.:600; I.V.:70; IV Piggyback:1972.3] Out: 531 [Urine:400; Drains:130; Stool:1]  Neurologic: No change in extremity strength. LLE 1+, LUE 4-/5  Lab Results: Lab Results  Component Value Date   WBC 5.4 07/10/2018   HGB 9.3 (L) 07/10/2018   HCT 31.0 (L) 07/10/2018   MCV 98.4 07/10/2018   PLT 66 (L) 07/10/2018   Lab Results  Component Value Date   INR 1.33 07/09/2018   BMET Lab Results  Component Value Date   NA 139 07/10/2018   K 4.4 07/10/2018   CL 106 07/10/2018   CO2 26 07/10/2018   GLUCOSE 296 (H) 07/10/2018   BUN 43 (H) 07/10/2018   CREATININE 1.05 07/10/2018   CALCIUM 9.0 07/10/2018    Studies/Results: No results found.  Assessment/Plan: Doing ok, no change. Bone marrow biopsy planned for tomorrow. Will plan to transfuse FFP and platelets tonight, pull hemovac drain tomorrow. Will keep ancef until drain is pulled. Ok to wean off of decadron.    LOS: 33 days    [name] [name] [name] [name] 07/10/2018, 3:08 PM

## 2018-07-10 NOTE — Progress Notes (Signed)
I am back now from being off.  Unfortunately, [name] [name] has not improved by any means.  He has had 2 additional surgeries since I have been off.  I still cannot find any obvious bleeding issue that he would have.  I have noted that his platelet count is going down.  I have to believe that this is from some medication that he is on.  When he came in to the hospital, his platelet count was okay.  I probably would see about getting a bone marrow biopsy on him now.  I need to make sure that there is nothing that we are overlooking that could be a culprit for this recurrent hematoma.  He is not had bleeding anywhere else.  He now cannot really move his left leg.  I do not know if this is something from having multiple surgeries.  He has good sensation in his left leg.  His left arm is all right.  He has a Foley catheter and so he really is not aware of urinating.  He does seem to know when he is having a bowel movement.  Again, I just have not uncovered any factor that could cause both hypercoagulable issues and bleeding issues.  His legs do not look as swollen.  I am sure there is still blood clots in there.  I told him that there is nothing that can be done for these as we cannot get him on anticoagulation.  I do not think vascular surgery or interventional radiology would do a invasive thrombectomy without him being on anticoagulation.  He had a thrombophilic panel sent off.  All the studies came back okay.  His homocystine level is fine.  I cannot imagine that an INR of 1.7 would be an issue with respect to bleeding.  I talked to [place] regarding the bone marrow test.  He is agreeable to have this done.  I think this is reasonable.  I am not convinced that the HITT assay is clinically significant, but I would definitely avoid any product with heparin or low molecular weight heparin.  I just feel bad for [name] [name].  This is a very complicated case.  I just wish there was an obvious reason  that could tie everything together.  We have just not been able to come up with this yet.  [name], MD  1 Corinthians 15:20

## 2018-07-10 NOTE — Transfer of Care (Signed)
Immediate Anesthesia Transfer of Care Note  Patient: [name] [name]  Procedure(s) Performed: POSTERIOR CERVICAL HEMATOMA EVACUATION (N/A Neck)  Patient Location: PACU  Anesthesia Type:General  Level of Consciousness: drowsy  Airway & Oxygen Therapy: Patient Spontanous Breathing and Patient connected to face mask oxygen  Post-op Assessment: Report given to RN and Post -op Vital signs reviewed and stable  Post vital signs: Reviewed and stable  Last Vitals:  Vitals Value Taken Time  BP 118/90 07/10/2018 11:05 PM  Temp    Pulse 101 07/10/2018 11:09 PM  Resp 24 07/10/2018 11:09 PM  SpO2 100 % 07/10/2018 11:09 PM  Vitals shown include unvalidated device data.  Last Pain:  Vitals:   07/10/18 2045  TempSrc: Oral  PainSc: 10-Worst pain ever      Patients Stated Pain Goal: 4 (07/04/18 1616)  Complications: No apparent anesthesia complications

## 2018-07-10 NOTE — Progress Notes (Signed)
Acute DVT noted with plans for IR. No significant bradycardia noted. Will sign-off for now, but be available as needed. Call with questions.  [facility] will sign off.   Medication Recommendations:  none Other recommendations (labs, testing, etc):  none Follow up as an outpatient:  Dr. Mertha Finders, MD, [facility], FACP  [place]  [facility]  Medical Director of the [facility]  [facility] Diplomate of the American Board of Clinical Lipidology Attending Cardiologist  Direct Dial: [phone]  Fax: [phone]  Website:  [email or web address]

## 2018-07-10 NOTE — Anesthesia Procedure Notes (Signed)
Procedure Name: Intubation Date/Time: 07/10/2018 9:50 PM Performed by: Claudina Lick, CRNA Pre-anesthesia Checklist: Patient identified, Emergency Drugs available, Suction available, Patient being monitored and Timeout performed Patient Re-evaluated:Patient Re-evaluated prior to induction Oxygen Delivery Method: Circle system utilized Preoxygenation: Pre-oxygenation with 100% oxygen Induction Type: IV induction, Rapid sequence and Cricoid Pressure applied Laryngoscope Size: Glidescope Grade View: Grade I Tube type: Subglottic suction tube Tube size: 7.5 mm Number of attempts: 1 Airway Equipment and Method: Stylet and Video-laryngoscopy Placement Confirmation: ETT inserted through vocal cords under direct vision,  positive ETCO2 and breath sounds checked- equal and bilateral Secured at: 23 cm Tube secured with: Tape Dental Injury: Teeth and Oropharynx as per pre-operative assessment

## 2018-07-10 NOTE — Progress Notes (Signed)
Pt complained of decrease in function and mobility of LUE. Patient now has paralysis which is different from initial shift assessment and 1200 and 1600 assessment. Patient also has lost his ability to grip with left hand.   Nurse contacted the office as patient states "this is what happens every time I have a clot".  Dr. Maurice Small return Nurse's call and put an order for a STAT MRI.   Patient resting comfortably inbed.

## 2018-07-10 NOTE — Progress Notes (Signed)
*  Preliminary Results* Bilateral lower extremity venous duplex completed. Bilateral lower extremities are positive for deep vein thrombosis in the common femoral vein, proximal profunda vein, femoral vein, popliteal vein,posterior tibial vein, and peroneal vein.  There is no evidence of Baker's cyst bilaterally.  07/10/2018 3:54 PM [name] [name] Clare Gandy

## 2018-07-10 NOTE — Progress Notes (Addendum)
PROGRESS NOTE    [name] [name]  WUJ:[id number] DOB: 16-Sep-1965 DOA: 06/06/2018 PCP: Helane Rima, MD    Brief Narrative: Brief Narrative:  52 year old man with past medical history relevant for gout, hypertension anterior cervical corpectomy of C4 on 03/24/2018 with comp gated postoperative course including development of multiple PEs and DVTs started on anticoagulation and subsequently developed hematoma requiring reexploration on 03/28/2018 status post placement of IVC filter who was discharged to skilled nursing facility and had a complicated course including progressive lower extremity weakness, intermittent fevers of unclear etiology and hospitalizations for "pneumonia". Patient was admitted to this hospital is a transfer from [facility] with MRI of C-spine that showed postoperative changes and cord compression with cord edema and concern for ischemia. Patient is status post posterior cervical decompression laminectomy of C3, C4, C5, removal of spinous processes, foraminotomies on 06/14/2018. On 06/22/2018 patient began to develop worsening left sided paresthesias and weakness and a stat MRI showed evidence of postoperative hematoma. Patient was taken to the OR with evacuation of hematoma. Additionally his postoperative course has been complicated by recurrent fevers,bilateral lower extremity DVTs on ultrasound,migratory arthralgias/arthritis that are thought to be rheumatologic in nature as well as significant orthostasis limiting the amount he can work with physical therapy.  Noted to have decreased left hand grip on 10/19, MRI showed large epidural hematoma, patient s/p clot evacuation. Continues to have thrombocytopenia   Assessment & Plan:   Principal Problem:   Fever Active Problems:   Myelopathy (HCC)   Benign essential HTN   Cord compression (HCC)   History of pulmonary embolism   Spondylosis, cervical, with myelopathy   Labile blood pressure   Orthostatic  hypotension   Leukocytosis   Tachycardia-bradycardia syndrome (HCC)   Near syncope due to orthostatic hypotension Thought to be related to hypovolemia and autonomic dysfunction.  On midodrine and TED hose.  Echocardiogram July 2019 showed a normal systolic function, grade 2 diastolic dysfunction.  No significant valve abnormalities are present Evaluated by cardiology. Continue with midodrine. Abdominal binder ordered.  Stable.   Bradycardia;  Notice have lower HR 10-23. Asymptomatic.  Cardiology consulted, for both bradycardia, and syncope and orthostatic hypotension.  No indication for pacer. Thought argatroban might be causing bradycardia.  Stable.   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  -IV antibiotics per sx.  -Discussed with Dr Saintclair Halsted 10-26;  hold anticoagulation for at least 1 week. If anticoagulation is going to be consider, this will need to be discussed with Dr Saintclair Halsted. Patient has required multiples surgical evacuation of epidural hematoma. Difficult situation with patient recent thrombotic event.   Polyarthralgia/persistent fever -Patient had symptoms in the shoulders and ankles, along with a history of gout and pain in his knees.  He underwent arthrocentesis however no evidence of infection was found. -He was noted to have elevated inflammatory markers including CCP, CRP.  However ANCA, aldolase, ANA were negative. -Previous rounding physician discussed with Dr. Kathlene November, rheumatologist, he will follow-up with the patient as an outpatient. -Patient was  placed on prednisone however currently on dexamethasone (neurosurgery) Fever resolved./  Denies joint pain.   Thrombocytopenia/coagulopathy -Cardiolipin antibody negative -Lupus  anticoagulant, homocysteine level, factor VIII within normal limits -Protein S activity 60 (low) -Protein C total >200; Activity 199 (high)  -HIT panel 0.486 (mildly elevated)- SRA (serotonin release assay) negative -immature platelet fraction WNL -Was given 2u platelets on 10/19 in the OR  Platelet stable. And 2 units 10-25 for OR.  Dr Marin Olp recommending Bone marrow biopsy.  Bone Marrow Biopsy tomorrow.   Recent PE /DVT in setting of spine surgery -Status post IVC filter placement on March 28, 2018 -Patient was on apixaban however changed over to IV heparin.  Anticoagulation however was discontinued for surgery.  Patient also noted to have thrombocytopenia. -Vascular DVT US 06/26/2018 showed bilateral lower extremity DVTs likely acute -Case was discussed with  vascular surgery, Dr. Trula Slade, patient is out of the 3 week window for treatment, he would have to get TPA, which is contraindicated in a bleeding patient.  -Bivalirudin discontinue by neurosurgery. Serotonin release assay negative.  -Discussed with Dr Saintclair Halsted hold anticoagulation for at least 1 week. If anticoagulation is going to be consider, this will need to be discussed with Dr Saintclair Halsted. Patient has required multiples surgical evacuation of epidural hematoma. Difficult situation with patient recent thrombotic event.  Decreased movement of left LE today. Plan to get [place] rule out DVT.  Discussed with Dr Annamaria Boots, thrombectomy only will not be enough to take care of B/L thrombus, patient will require anticoagulation, TPA. Patient is too high risk for bleeding, with recurrent epidural hematoma. Patient can not received anticoagulation at this time. Also Dr Saintclair Halsted will need to clear patient to be on anticoagulation.    Normocytic anemia Monitor hb.  Stable.    Gout -Does not seem to be active at this time, continue Uloric   Mild acute renal failure; resolved.   HTN; amlodipine discontinue due to orthostatic hypotension and edema.   Gout;  Continue with uloric.   Anemia;  was given 1u PRBC on 10/19 in OR   DVT prophylaxis: argatroban.  Code Status: full code.  Family Communication: care discussed with patient,  Disposition Plan: remain in the hospital for treatment of Blood clot, monitor for development of hematoma.     Consultants:   Neurosurgery  Hematology  Cardiology    Procedures: On 10/2 1. posterior cervical decompressive laminectomy at C3, C4, C5 with removal of the spinous process and complete laminectomy and foraminotomies of the C3, C4, C5 nerve roots. 2. Posterior cervical fusion with lateral mass screws at C3-C4-C5 and pars screws at C2 lies in the globus ellipseLateral mass screw system 3. Posterior lateral arthrodesis C2-C5 utilizing locally harvested autograft mixed with vivigen  06/22/2018: Procedure reexploration posterior cervical wound for evacuation of epidural hematoma  06/26/2018 DVT ultrasound:Right: Findings consistent with acute deep vein thrombosis involving the right common femoral vein, right femoral vein, right popliteal vein, right posterior tibial vein, and right peroneal vein. Thrombosis extends proximally into external iliac vein.  Proximal iliac vein not visualized. Left: Findings consistent with acute deep vein thrombosis involving the left common femoral vein, left femoral vein, left popliteal vein, left posterior tibial vein, and left peroneal vein. Findings consistent with acute superficial vein thrombosis  involving the left great saphenous vein. Thrombosis extends proximally into external iliac vein. Proximal iliac vein not visualized.  07/01/18 Clot evacuation   RUE doppler 07/02/18    Antimicrobials:    Subjective: Left upper extremity weakness stable.  He report  worsening Left LE weakness,, he relate he told this to neurosurgery PA> I will confirm with them  Objective: Vitals:   07/10/18 0830 07/10/18 0900 07/10/18 1118 07/10/18 1156  BP:   (!) 189/139 123/85  Pulse: 89 86 96 88  Resp: _0 Temp:    98.2 F (36.8 C)  TempSrc:      SpO2: 97% 99%  97%  Weight:      Height:        Intake/Output Summary (Last 24 hours) at 07/10/2018 1342 Last data filed at 07/10/2018 1100 Gross per 24 hour  Intake 2692.33 ml  Output 1031 ml  Net 1661.33 ml   Filed Weights   06/14/18 1151 06/22/18 1641 07/07/18 1000  Weight: 123.8 kg 123.8 kg 123.8 kg    Examination:  General exam: NAD Respiratory system: CTA Cardiovascular system: S 1, S 2 RRR Gastrointestinal system: BS present, soft, nt, nd Central nervous system: Alert and oriented.  Extremities: Bilateral LE edema.  Skin: No rashes.     Data Reviewed: I have personally reviewed following labs and imaging studies  CBC: Recent Labs  Lab 07/07/18 0935 07/08/18 0500 07/09/18 0515 07/09/18 0834 07/10/18 0500  WBC 6.9 6.9 6.9 6.5 5.4  NEUTROABS  --   --   --  5.7  --   HGB 10.2* 8.9* 9.6* 9.6* 9.3*  HCT 33.1* 29.4* 31.5* 31.7* 31.0*  MCV 98.8 98.7 98.7 99.1 98.4  PLT 82* 64* 68* 60* 66*   Basic Metabolic Panel: Recent Labs  Lab 07/05/18 0513 07/07/18 0935 07/08/18 0500 07/09/18 0515 07/10/18 0747  NA 139 138 140 138 139  K 4.7 4.3 4.6 4.7 4.4  CL 106 106 105 106 106  CO2 _1 GLUCOSE 227* 308* 292* 315* 296*  BUN 38* 39* 38* 41* 43*  CREATININE 0.83 0.94 0.85 0.90 1.05  CALCIUM 9.0 8.9 9.2 9.1 9.0  MG 2.1  --   --   --   --    GFR: Estimated Creatinine Clearance: 107 mL/min (by C-G formula based on SCr of 1.05 mg/dL). Liver Function Tests: No results for input(s): AST, ALT, ALKPHOS, BILITOT, PROT, ALBUMIN in the last 168 hours. No results for input(s): LIPASE, AMYLASE in the last 168 hours. No results for input(s): AMMONIA in the last 168  hours. Coagulation Profile: Recent Labs  Lab 07/04/18 0815 07/07/18 0935 07/09/18 0834  INR 1.26 1.70 1.33   Cardiac Enzymes: Recent Labs  Lab 07/05/18 1111 07/05/18 1612 07/05/18 2219  TROPONINI <0.03 <0.03 <0.03   BNP (last 3 results) No results for input(s): PROBNP in the last 8760 hours. HbA1C: No results for input(s): HGBA1C in the last 72 hours. CBG: Recent Labs  Lab 07/09/18 1146 07/09/18 1608 07/09/18 2100 07/10/18 0725 07/10/18 1215  GLUCAP 328* 262* 296* 278* 288*   Lipid Profile: No results for input(s): CHOL, HDL, LDLCALC, TRIG, CHOLHDL, LDLDIRECT in the last 72 hours. Thyroid Function Tests: No results for input(s): TSH, T4TOTAL, FREET4, T3FREE, THYROIDAB in the last 72 hours. Anemia Panel: No results for input(s): VITAMINB12, FOLATE, FERRITIN, TIBC, IRON, RETICCTPCT in the last 72 hours. Sepsis Labs: No results for input(s): PROCALCITON, LATICACIDVEN in the last 168 hours.  No results found for this or any previous visit (from the past 240 hour(s)).       Radiology Studies: No results found.      Scheduled Meds: . sodium chloride   Intravenous Once  . dexamethasone  8 mg Intravenous Q6H  . docusate sodium  100 mg Oral Daily  . doxycycline  100 mg Oral  Q12H  . enalaprilat  1.25 mg Intravenous Once  . febuxostat  40 mg Oral Daily  . gabapentin  300 mg Oral TID  . Gerhardt's butt cream   Topical BID  . Influenza vac split quadrivalent PF  0.5 mL Intramuscular Tomorrow-1000  . insulin aspart  0-5 Units Subcutaneous QHS  . insulin aspart  0-9 Units Subcutaneous TID WC  . insulin glargine  10 Units Subcutaneous QHS  . methocarbamol  750 mg Oral QID  . midodrine  5 mg Oral TID WC  . nystatin  5 mL Oral TID AC & HS  . senna-docusate  1 tablet Oral QHS  . sodium chloride flush  10-40 mL Intracatheter Q12H   Continuous Infusions: .  ceFAZolin (ANCEF) IV 2 g (07/10/18 0846)  . lactated ringers 10 mL/hr at 07/10/18 0800     LOS: 33 days     Time spent: 35 minutes.     Elmarie Shiley, MD [facility] Pager [phone]  If 7PM-7AM, please contact night-coverage [email or web address] Password [email or web address] 07/10/2018, 1:42 PM

## 2018-07-10 NOTE — Progress Notes (Signed)
Inpatient Diabetes Program Recommendations  AACE/ADA: New Consensus Statement on Inpatient Glycemic Control (2015)  Target Ranges:  Prepandial:   less than 140 mg/dL      Peak postprandial:   less than 180 mg/dL (1-2 hours)      Critically ill patients:  140 - 180 mg/dL   Lab Results  Component Value Date   GLUCAP 278 (H) 07/10/2018    Review of Glycemic Control Results for JAGJIT, RINER (MRN [id number]) as of 07/10/2018 12:02  Ref. Range 07/09/2018 11:46 07/09/2018 16:08 07/09/2018 21:00 07/10/2018 07:25  Glucose-Capillary Latest Ref Range: 70 - 99 mg/dL 782 (H) 956 (H) 213 (H) 278 (H)   Diabetes history: None Outpatient Diabetes medications: None Current orders for Inpatient glycemic control:  Novolog sensitive tid with meals and HS, Lantus 10 units QHS Decadron 8 mg Q6H  Inpatient Diabetes Program Recommendations:    In the setting of steroids, consider increasing Lantus to 25 units QHS (123 kg x 0.2) and adding Novolog 3 units TID (assuming that patient is consuming >50% of meal).  Thanks, Lujean Rave, MSN, RNC-OB Diabetes Coordinator [phone] (8a-5p)

## 2018-07-10 NOTE — Consult Note (Signed)
Chief Complaint: Patient was seen in consultation today for bone marrow biopsy at the request of Dr Pearletha Alfred  Supervising Physician: Daryll Brod  Patient Status: [facility] - In-pt  History of Present Illness: [name] [name] is a 52 y.o. male   Post C4 cervical corpectomy 02/7208 Complicated course development DVTs and PEs IVC filter placement 03/28/18 Discharged to SNF  Lower extr weakness and PNA  Additional cervical surgeries-- Post op changes included development cervical hematoma Evacuation of cervical hematoma 06/22/18 Decreased left hand grip Recent MRI 07/01/18:  IMPRESSION: 1. Severe thecal sac effacement with cord edema from C2-3 to C5-6 primarily related to tense posterior fluid collection with hematocrit levels. 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 2. C3-C5 anterior fusion with C4 corpectomy.  Now with new Bilat DVT Summary: Right: Findings consistent with acute deep vein thrombosis involving the right common femoral vein, right femoral vein, right popliteal vein, right posterior tibial vein, and right peroneal vein. Thrombosis extends proximally into external iliac vein.  Proximal iliac vein not visualized. Left: Findings consistent with acute deep vein thrombosis involving the left common femoral vein, left femoral vein, left popliteal vein, left posterior tibial vein, and left peroneal vein. Findings consistent with acute superficial vein thrombosis  involving the left great saphenous vein. Thrombosis extends proximally into external iliac vein. Proximal iliac vein not visualized.  Persistent and worsening thrombocytopenia Oncolgy Dr Marin Olp has consulted with this pt Requesting Bone Marrow biopsy  Scheduled for same 10//29/19  Past Medical History:  Diagnosis Date  . Degenerative disc disease, lumbar   . Gout   . History of pernicious anemia 1980   Patient says he takes OTC  iron supplements QD  . Hypertension     Past Surgical History:  Procedure Laterality Date  . ANTERIOR CERVICAL CORPECTOMY N/A 03/24/2018   Procedure: CERVICAL FOUR ANTERIOR CERVICAL CORPECTOMY;  Surgeon: Kary Kos, MD;  Location: [place];  Service: Neurosurgery;  Laterality: N/A;  . ANTERIOR CERVICAL DECOMP/DISCECTOMY FUSION N/A 03/28/2018   Procedure: Reexploration of anterior cervical wound;  Surgeon: Kary Kos, MD;  Location: [place];  Service: Neurosurgery;  Laterality: N/A;  . IR IVC FILTER PLMT / S&I Burke Keels GUID/MOD SED  03/28/2018  . IR RADIOLOGIST EVAL & MGMT  05/31/2018  . MENISCUS REPAIR Left   . POSTERIOR CERVICAL FUSION/FORAMINOTOMY N/A 06/14/2018   Procedure: POSTERIOR CERVICAL DECOMPRESSION/FUSION CERVICAL TWO- CERVICAL THREE CERVICAL THREE- CERVICAL FOUR, CERVICAL FOUR- CERVICAL FIVE;  Surgeon: Kary Kos, MD;  Location: [place];  Service: Neurosurgery;  Laterality: N/A;  POSTERIOR CERVICAL DECOMPRESSION/FUSION CERVICAL TWO- CERVICAL THREE CERVICAL THREE- CERVICAL FOUR, CERVICAL FOUR- CERVICAL FIVE  . POSTERIOR CERVICAL LAMINECTOMY N/A 06/22/2018   Procedure: POSTERIOR CERVICAL LAMINECTOMY FOR EVACUATION OF HEMATOMA;  Surgeon: Kary Kos, MD;  Location: [place];  Service: Neurosurgery;  Laterality: N/A;  . POSTERIOR CERVICAL LAMINECTOMY FOR EPIDURAL ABSCESS N/A 07/01/2018   Procedure: EVACUATION OF POSTERIOR CERVICAL HEMATOMA;  Surgeon: Judith Part, MD;  Location: [place];  Service: Neurosurgery;  Laterality: N/A;  . TENDON REPAIR     tricept tendon    Allergies: Nsaids; Sulfa antibiotics; and Protonix [pantoprazole sodium]  Medications: Prior to Admission medications   Medication Sig Start Date End Date Taking? Authorizing Provider  acetaminophen (TYLENOL) 325 MG tablet Take 2 tablets (650 mg total) by mouth every 4 (four) hours as needed for mild pain ((score 1 to 3) or temp > 100.5). 05/02/18  Yes [place], Lavon Paganini,  PA-C  apixaban (ELIQUIS) 5 MG TABS tablet Take 1 tablet (5  mg total) by mouth 2 (two) times daily. 05/02/18  Yes Angiulli, Lavon Paganini, PA-C  aspirin EC 81 MG EC tablet Take 1 tablet (81 mg total) by mouth daily. 05/02/18  Yes Angiulli, Lavon Paganini, PA-C  baclofen (LIORESAL) 10 MG tablet Take 1 tablet (10 mg total) by mouth 2 (two) times daily as needed for muscle spasms. 05/02/18  Yes Angiulli, Lavon Paganini, PA-C  colchicine 0.6 MG tablet Take 1 tablet (0.6 mg total) by mouth daily. 05/02/18  Yes Angiulli, Lavon Paganini, PA-C  Cyanocobalamin 5000 MCG/ML LIQD Place 5,000 mcg under the tongue daily. Patient taking differently: Place 5,000 mcg under the tongue as needed.  05/02/18  Yes Angiulli, Lavon Paganini, PA-C  hydrochlorothiazide (MICROZIDE) 12.5 MG capsule Take 1 capsule (12.5 mg total) by mouth daily. 05/02/18  Yes Angiulli, Lavon Paganini, PA-C  oxyCODONE 10 MG TABS Take 1 tablet (10 mg total) by mouth every 3 (three) hours as needed for severe pain ((score 7 to 10)). 05/02/18  Yes Angiulli, Lavon Paganini, PA-C  saccharomyces boulardii (FLORASTOR) 250 MG capsule Take 1 capsule (250 mg total) by mouth 2 (two) times daily. Patient not taking: Reported on 06/08/2018 05/02/18   Angiulli, Lavon Paganini, PA-C     Family History  Family history unknown: Yes    Social History   Socioeconomic History  . Marital status: Legally Separated    Spouse name: Not on file  . Number of children: Not on file  . Years of education: Not on file  . Highest education level: Not on file  Occupational History  . Not on file  Social Needs  . Financial resource strain: Not on file  . Food insecurity:    Worry: Not on file    Inability: Not on file  . Transportation needs:    Medical: Not on file    Non-medical: Not on file  Tobacco Use  . Smoking status: Never Smoker  . Smokeless tobacco: Never Used  Substance and Sexual Activity  . Alcohol use: Never    Frequency: Never  . Drug use: Never  . Sexual activity: Not Currently    Birth control/protection: Abstinence  Lifestyle  . Physical activity:     Days per week: Not on file    Minutes per session: Not on file  . Stress: Not on file  Relationships  . Social connections:    Talks on phone: Not on file    Gets together: Not on file    Attends religious service: Not on file    Active member of club or organization: Not on file    Attends meetings of clubs or organizations: Not on file    Relationship status: Not on file  Other Topics Concern  . Not on file  Social History Narrative  . Not on file    Review of Systems: A 12 point ROS discussed and pertinent positives are indicated in the HPI above.  All other systems are negative.  Review of Systems  Constitutional: Positive for activity change. Negative for fatigue.  Respiratory: Negative for shortness of breath.   Cardiovascular: Negative for chest pain.  Gastrointestinal: Negative for abdominal pain.  Musculoskeletal: Positive for back pain and gait problem.  Neurological: Positive for weakness.  Psychiatric/Behavioral: Negative for behavioral problems and confusion.    Vital Signs: BP (!) 140/93   Pulse 86   Temp 98.2 F (36.8 C) (Oral)   Resp 13  Ht '5\' 9"'  (1.753 m)   Wt 272 lb 14.9 oz (123.8 kg)   SpO2 99%   BMI 40.30 kg/m   Physical Exam  Constitutional: He is oriented to person, place, and time.  Cardiovascular: Normal rate and regular rhythm.  Pulmonary/Chest: Effort normal and breath sounds normal.  Abdominal: Soft. Bowel sounds are normal.  Neurological: He is alert and oriented to person, place, and time.  Skin: Skin is warm and dry.  Psychiatric: He has a normal mood and affect. His behavior is normal. Judgment and thought content normal.  Pt with weakness Little use of arms secondary surgeries and hematomas Able to place X on consent With good understanding of procedure benefits and risks  Vitals reviewed.   Imaging: Dg Cervical Spine 2 Or 3 Views  Result Date: 06/14/2018 CLINICAL DATA:  Posterior cervical spine fusion EXAM: CERVICAL  SPINE - 2-3 VIEW COMPARISON:  CT cervical spine of 06/07/2017 FINDINGS: Two C-arm spot films were returned. These images show hardware placed for posterior fusion at C2-C3 C4 and by history C5 as well. The C4-5 area is not well seen on the C-arm images obtained. IMPRESSION: Hardware placed for posterior fusion from C2 to to C5 although the C5 level is not well seen. Electronically Signed   By: Ivar Drape M.D.   On: 06/14/2018 16:06   Ct Chest W Contrast  Result Date: 06/24/2018 CLINICAL DATA:  52 year old male with history of deep venous thrombosis. Suspected pulmonary embolism. EXAM: CT CHEST, ABDOMEN, AND PELVIS WITH CONTRAST TECHNIQUE: Multidetector CT imaging of the chest, abdomen and pelvis was performed following the standard protocol during bolus administration of intravenous contrast. CONTRAST:  174m OMNIPAQUE IOHEXOL 300 MG/ML  SOLN COMPARISON:  Chest CT 03/27/2018. No prior CT the abdomen and pelvis. FINDINGS: CT CHEST FINDINGS Cardiovascular: Today's study was not tailored for evaluation of pulmonary embolism. With this limitation in mind, there is no central or lobar sized filling defects in the pulmonary arterial tree to suggest large pulmonary embolus. Heart size is normal. There is no significant pericardial fluid, thickening or pericardial calcification. No atherosclerotic calcifications noted in the thoracic aorta or the coronary arteries. Right upper extremity PICC with tip terminating in the distal superior vena cava. Mediastinum/Nodes: No pathologically enlarged mediastinal or hilar lymph nodes. Esophagus is unremarkable in appearance. No axillary lymphadenopathy. Lungs/Pleura: Patchy multifocal peribronchovascular ground-glass attenuation is noted in the right lung, most evident in the right upper lobe posteriorly and in the basal segments of the right lower lobe, concerning for multilobar bronchopneumonia. No confluent consolidative airspace disease. No pleural effusions. No suspicious  appearing pulmonary nodules or masses are noted. Some dependent atelectasis is also noted in the left lower lobe. Musculoskeletal: There are no aggressive appearing lytic or blastic lesions noted in the visualized portions of the skeleton. CT ABDOMEN PELVIS FINDINGS Hepatobiliary: No suspicious cystic or solid hepatic lesions. No intra or extrahepatic biliary ductal dilatation. Gallbladder is unremarkable in appearance. Pancreas: No pancreatic mass. No pancreatic ductal dilatation. No pancreatic or peripancreatic fluid or inflammatory changes. Spleen: Unremarkable. Adrenals/Urinary Tract: Bilateral kidneys and bilateral adrenal glands are normal in appearance. No hydroureteronephrosis. Small amount of gas non dependently in the urinary bladder. Urinary bladder is otherwise unremarkable in appearance. Stomach/Bowel: Appearance of the stomach is normal. No pathologic dilatation of small bowel or colon. The appendix is not confidently identified and may be surgically absent. Regardless, there are no inflammatory changes noted adjacent to the cecum to suggest the presence of an acute appendicitis at this  time. Vascular/Lymphatic: Mild atherosclerosis in the pelvic vasculature. IVC filter in position with tip terminating below the level of the renal veins. No lymphadenopathy noted in the abdomen or pelvis. Reproductive: Prostate gland and seminal vesicles are unremarkable in appearance. Other: A small umbilical hernia containing a small amount of omental fat. No significant volume of ascites. No pneumoperitoneum. Musculoskeletal: There are no aggressive appearing lytic or blastic lesions noted in the visualized portions of the skeleton. IMPRESSION: 1. Patchy areas of peribronchovascular airspace consolidation in the right lung, compatible with mild multilobar bronchopneumonia. 2. Small amount of gas non dependently in the urinary bladder, nonspecific. Correlation with urinalysis is suggested, as the possibility of  infection with gas-forming organisms is not excluded. Alternatively, this could be iatrogenic if there has been recent catheterization. 3. Small umbilical hernia containing a small amount of omental fat. No associated bowel incarceration or obstruction. 4. Additional incidental findings, as above. Electronically Signed   By: Vinnie Langton M.D.   On: 06/24/2018 00:32   Ct Cervical Spine Wo Contrast  Result Date: 06/21/2018 CLINICAL DATA:  Neck pain after surgery. EXAM: CT CERVICAL SPINE WITHOUT CONTRAST TECHNIQUE: Multidetector CT imaging of the cervical spine was performed without intravenous contrast. Multiplanar CT image reconstructions were also generated. COMPARISON:  MRI cervical spine 06/07/2018. FINDINGS: Alignment: Straightening of the normal cervical lordosis. 2 mm anterolisthesis C2-3. Skull base and vertebrae: Status post C4 corpectomy, with C3-C5 ACDF. Satisfactory C4 cage placement, as well as C3 through C5 plate and screws. Additional posterior decompression, C3-C5, followed by lateral mass screws BILATERAL C3-C5 and BILATERAL C2 pars screws, connected by rods. Satisfactory posterior hardware placement. Hardware appears intact. Autograft material is noted bilaterally. Soft tissues and spinal canal: Residual osseous spurring and OPLL, most notable on the LEFT at lower C3 and upper C4. Central OPLL prominent behind C5. Disc levels:  Spondylosis at C5-6. Upper chest: Unremarkable. Other: None IMPRESSION: Posterior cervical decompression with lateral mass fusion, C2 through C5, as described above. Some residual OPLL at C3, C4, and C5, but with good decompression of the spinal canal. Electronically Signed   By: Staci Righter M.D.   On: 06/21/2018 12:41   Mr Cervical Spine Wo Contrast  Result Date: 07/01/2018 CLINICAL DATA:  Cord compression and edema. EXAM: MRI CERVICAL SPINE WITHOUT CONTRAST TECHNIQUE: Multiplanar, multisequence MR imaging of the cervical spine was performed. No intravenous  contrast was administered. COMPARISON:  06/22/2018 FINDINGS: Alignment: Normal Vertebrae: Status post C3-C5 ACDF with C4 corpectomy. Decompression and instrumentation from C2-C5. No acute fracture or discitis. Cord: Compression with edema throughout the operative region, severe with thecal sac narrowing to 2 -3 mm. Posterior Fossa, vertebral arteries, paraspinal tissues: There is a large tense appearing fluid collection with hematocrit levels within the laminectomy defects, deep fascial space, and extending to the skin surface. Edema and intrinsic neck muscles, likely postoperative. Disc levels: C2-3: Negative C3-4: Residual left eccentric ossicles shin of the posterior longitudinal ligament. Left foraminal impingement. 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 Mild left foraminal narrowing C5-6: Disc narrowing and bulging. Mild uncovertebral spurring. Spinal stenosis from fluid collection exerting mass effect on the dorsal thecal sac. C6-7: Unremarkable. C7-T1:Unremarkable. Results were called by telephone at the time of interpretation on 07/01/2018 at 8:28 pm to Dr. Zada Finders, who is already aware. IMPRESSION: 1. Severe thecal sac effacement with cord edema from C2-3 to C5-6 primarily related to tense posterior fluid collection with hematocrit levels. 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 2. C3-C5 anterior fusion with C4 corpectomy. Electronically Signed   By: Monte Fantasia M.D.   On: 07/01/2018 20:29   Mr Cervical Spine Wo Contrast  Result Date: 06/22/2018 CLINICAL DATA:  Initial evaluation for acute neck pain. EXAM: MRI CERVICAL SPINE WITHOUT CONTRAST TECHNIQUE: Multiplanar, multisequence MR imaging of the cervical spine was performed. No intravenous contrast was administered. COMPARISON:  Prior MRI from 06/07/2018 as well as CT from 06/21/2018. FINDINGS: Alignment:  Straightening of the normal cervical lordosis. No listhesis or malalignment. Vertebrae: Postoperative changes from prior C4 corpectomy with C3 through C5 ACDF. Posterior decompression with fusion at C2 through C5. Vertebral body heights maintained. No acute or interval fracture. No discrete osseous lesions. Cord: Persistent expansile T2 hyperintensity within the cervical spinal cord extending from C4 through C6-7 (series 2, image 8). Signal intensity within the cervical spinal cord otherwise grossly within normal limits although evaluation limited by susceptibility artifact. Curvilinear posterior epidural collection extending from the posterior laminectomy site inferiorly measures up to approximately 4 mm in AP diameter (series 4, image 36). Posterior Fossa, vertebral arteries, paraspinal tissues: Visualized brain and posterior fossa within normal limits. Craniocervical junction normal. Postsurgical changes from recent posterior decompression with fusion present within the posterior paraspinous soft tissues. Small postoperative collection at the laminectomy site measures approximately 2.1 x 1.3 x 2.4 cm, although exact measurements difficult given adjacent susceptibility artifact. Normal intravascular flow voids present within the vertebral arteries bilaterally. Disc levels: C2-C3: Residual 5 x 7 mm left central disc osteophyte complex/OPLL extending to approximately C4-5 inferiorly, similar to previous. Persistent mild to moderate spinal stenosis, greater on the left, similar to previous. Spinal cord deviated to the right. Foramina remain patent. C3-C4: Left disc osteophyte complex/OPLL with impingement of the left ventral spinal cord. Status post posterior decompression with fusion. Persistent severe spinal stenosis with impingement upon the left ventral spinal cord, worsened from previous. Spinal cord deviated to the right. Residual uncovertebral and facet hypertrophy with resultant moderate left with mild right  foraminal stenosis, grossly stable. C4-C5: Broad-based left eccentric disc osteophyte complex flattens and largely effaces the ventral CSF. Interval posterior decompression with fusion. Persistent severe spinal stenosis, worsened from previous. Thecal sac measures 3 mm in AP diameter at its most narrow point on the left (series 4, image 17), previously 6 mm. Moderate to severe left with moderate right foraminal narrowing, grossly stable. C5-C6: Broad-based posterior disc osteophyte partially effaces the ventral thecal sac. Interval posterior decompression with fusion. Residual mild spinal stenosis, mildly improved from previous. Residual uncovertebral hypertrophy without significant foraminal stenosis, also improved from previous. C6-C7: Mild disc bulge with uncovertebral hypertrophy. Mild left C7 neural foraminal narrowing. No significant right foraminal stenosis. C7-T1: Mild bilateral facet hypertrophy. No significant canal or foraminal stenosis. Visualized upper thoracic spine demonstrates no other significant findings. IMPRESSION: 1. Postoperative changes from prior C4 corpectomy with ACDF at C3-C5, with posterior decompression and fusion at C2 through C5. Residual left-sided disc osteophyte/OPLL at C2-3 through C4-5 with persistent severe spinal stenosis at C3-4 and C4-5, worsened relative to previous MRI from 06/07/2018. 2. Abnormal cord signal intensity at C4 through C6-7, consistent with edema. 3. Multilevel neural foraminal narrowing at C2-3 through C6-7 as above, relatively similar as compared to previous exam. Electronically Signed   By: Jeannine Boga M.D.   On: 06/22/2018 16:46   Ct Abdomen Pelvis W Contrast  Result Date: 06/24/2018 CLINICAL DATA:  52 year old male with history of deep venous thrombosis. Suspected  pulmonary embolism. EXAM: CT CHEST, ABDOMEN, AND PELVIS WITH CONTRAST TECHNIQUE: Multidetector CT imaging of the chest, abdomen and pelvis was performed following the standard  protocol during bolus administration of intravenous contrast. CONTRAST:  132m OMNIPAQUE IOHEXOL 300 MG/ML  SOLN COMPARISON:  Chest CT 03/27/2018. No prior CT the abdomen and pelvis. FINDINGS: CT CHEST FINDINGS Cardiovascular: Today's study was not tailored for evaluation of pulmonary embolism. With this limitation in mind, there is no central or lobar sized filling defects in the pulmonary arterial tree to suggest large pulmonary embolus. Heart size is normal. There is no significant pericardial fluid, thickening or pericardial calcification. No atherosclerotic calcifications noted in the thoracic aorta or the coronary arteries. Right upper extremity PICC with tip terminating in the distal superior vena cava. Mediastinum/Nodes: No pathologically enlarged mediastinal or hilar lymph nodes. Esophagus is unremarkable in appearance. No axillary lymphadenopathy. Lungs/Pleura: Patchy multifocal peribronchovascular ground-glass attenuation is noted in the right lung, most evident in the right upper lobe posteriorly and in the basal segments of the right lower lobe, concerning for multilobar bronchopneumonia. No confluent consolidative airspace disease. No pleural effusions. No suspicious appearing pulmonary nodules or masses are noted. Some dependent atelectasis is also noted in the left lower lobe. Musculoskeletal: There are no aggressive appearing lytic or blastic lesions noted in the visualized portions of the skeleton. CT ABDOMEN PELVIS FINDINGS Hepatobiliary: No suspicious cystic or solid hepatic lesions. No intra or extrahepatic biliary ductal dilatation. Gallbladder is unremarkable in appearance. Pancreas: No pancreatic mass. No pancreatic ductal dilatation. No pancreatic or peripancreatic fluid or inflammatory changes. Spleen: Unremarkable. Adrenals/Urinary Tract: Bilateral kidneys and bilateral adrenal glands are normal in appearance. No hydroureteronephrosis. Small amount of gas non dependently in the urinary  bladder. Urinary bladder is otherwise unremarkable in appearance. Stomach/Bowel: Appearance of the stomach is normal. No pathologic dilatation of small bowel or colon. The appendix is not confidently identified and may be surgically absent. Regardless, there are no inflammatory changes noted adjacent to the cecum to suggest the presence of an acute appendicitis at this time. Vascular/Lymphatic: Mild atherosclerosis in the pelvic vasculature. IVC filter in position with tip terminating below the level of the renal veins. No lymphadenopathy noted in the abdomen or pelvis. Reproductive: Prostate gland and seminal vesicles are unremarkable in appearance. Other: A small umbilical hernia containing a small amount of omental fat. No significant volume of ascites. No pneumoperitoneum. Musculoskeletal: There are no aggressive appearing lytic or blastic lesions noted in the visualized portions of the skeleton. IMPRESSION: 1. Patchy areas of peribronchovascular airspace consolidation in the right lung, compatible with mild multilobar bronchopneumonia. 2. Small amount of gas non dependently in the urinary bladder, nonspecific. Correlation with urinalysis is suggested, as the possibility of infection with gas-forming organisms is not excluded. Alternatively, this could be iatrogenic if there has been recent catheterization. 3. Small umbilical hernia containing a small amount of omental fat. No associated bowel incarceration or obstruction. 4. Additional incidental findings, as above. Electronically Signed   By: DVinnie LangtonM.D.   On: 06/24/2018 00:32   Dg Chest Port 1 View  Result Date: 06/19/2018 CLINICAL DATA:  COUGH,HX DVT and PE AFTER CERVICAL SURGERY EXAM: PORTABLE CHEST 1 VIEW COMPARISON:  04/26/2018 FINDINGS: The heart size and mediastinal contours are within normal limits. Both lungs are clear. The visualized skeletal structures are unremarkable. IMPRESSION: No active disease. Electronically Signed   By:  ENolon NationsM.D.   On: 06/19/2018 10:06   Dg C-arm 1-60 Min  Result Date: 06/14/2018 CLINICAL  DATA:  Posterior fusion from C2-C5 EXAM: DG C-ARM 61-120 MIN COMPARISON:  CT C-spine of 06/07/2017 FINDINGS: C-arm fluoroscopy was provided during posterior fusion from C2-C5 by history. Fluoroscopy time of 40 seconds was recorded. IMPRESSION: C-arm fluoroscopy provided. Electronically Signed   By: Ivar Drape M.D.   On: 06/14/2018 16:07   [place] Ekg Site Rite  Result Date: 07/06/2018 If Site Rite image not attached, placement could not be confirmed due to current cardiac rhythm.  [place] Ekg Site Rite  Result Date: 07/01/2018 If Site Rite image not attached, placement could not be confirmed due to current cardiac rhythm.  [place] Ekg Site Rite  Result Date: 07/01/2018 If Site Rite image not attached, placement could not be confirmed due to current cardiac rhythm.  [place] Ekg Site Rite  Result Date: 06/23/2018 If Site Rite image not attached, placement could not be confirmed due to current cardiac rhythm.   Labs:  CBC: Recent Labs    07/08/18 0500 07/09/18 0515 07/09/18 0834 07/10/18 0500  WBC 6.9 6.9 6.5 5.4  HGB 8.9* 9.6* 9.6* 9.3*  HCT 29.4* 31.5* 31.7* 31.0*  PLT 64* 68* 60* 66*    COAGS: Recent Labs    07/02/18 1027  07/04/18 0815  07/07/18 0935 07/08/18 0500 07/09/18 0515 07/09/18 0834 07/10/18 0500  INR 1.22  --  1.26  --  1.70  --   --  1.33  --   APTT  --    < >  --    < > 48* 24 23*  --  24   < > = values in this interval not displayed.    BMP: Recent Labs    07/07/18 0935 07/08/18 0500 07/09/18 0515 07/10/18 0747  NA 138 140 138 139  K 4.3 4.6 4.7 4.4  CL 106 105 106 106  CO2 '24 27 26 26  ' GLUCOSE 308* 292* 315* 296*  BUN 39* 38* 41* 43*  CALCIUM 8.9 9.2 9.1 9.0  CREATININE 0.94 0.85 0.90 1.05  GFRNONAA >60 >60 >60 >60  GFRAA >60 >60 >60 >60    LIVER FUNCTION TESTS: Recent Labs    06/07/18 0021 06/12/18 0119 06/13/18 0500 06/22/18 0310  BILITOT  0.8 0.4 0.3 0.6  AST 26 32 55* 16  ALT 17 27 47* 21  ALKPHOS 71 77 71 58  PROT 6.5 6.1* 6.3* 5.7*  ALBUMIN 2.8* 2.6* 2.5* 2.8*    TUMOR MARKERS: No results for input(s): AFPTM, CEA, CA199, CHROMGRNA in the last 8760 hours.  Assessment and Plan:  Thrombocytopenia Persistent and worsening Oncology requesting Bone marrow bx Scheduled for am Risks and benefits discussed with the patient including, but not limited to bleeding, infection, damage to adjacent structures or low yield requiring additional tests.  All of the patient's questions were answered, patient is agreeable to proceed. Consent signed and in chart.   Thank you for this interesting consult.  I greatly enjoyed meeting Keino Shave and look forward to participating in their care.  A copy of this report was sent to the requesting provider on this date.  Electronically Signed: Lavonia Drafts, PA-C 07/10/2018, 10:12 AM   I spent a total of 20 Minutes    in face to face in clinical consultation, greater than 50% of which was counseling/coordinating care for BM Bx

## 2018-07-10 NOTE — Progress Notes (Signed)
Occupational Therapy Treatment Patient Details Name: [name] [name] MRN: [id number] DOB: Oct 31, 1965 Today's Date: 07/10/2018    History of present illness 52 y.o. male admitted on 06/06/18 for bil LE wekness and muscle spasm after multiple falls (with resultant R knee meniscus tear placed in bledsoe brace) following a recent ACDF at [facility] in July 2019 (complicated by PE (s/p IVC filter), CIR, and then SNF placement before finally returning home with his parents).  In the ED MRI of c-spine which showed post operative changes and cord edema and possibility of ischemia.  His R knee was aspirated on 06/09/18.  S/p posterior cervical decompression and fusion on 06/14/18. Entire acute course complicated by hypotension in standing (thought to be autonomic) and by finding of multiple LE DVTs (already has an IVC filter in place, but per MDs notes at risk for blocking the filter with these clots).  Pt with post op issues with incisional bleeding s/p 2 evacuations of hematomas and placement of hemovac (06/22/18 and 07/01/18) as well as multiple units of plasma.  Cardiology consulted 07/05/18 due to bradycardia thought to be from his newly started blood thinner.  PMHx of HTN, gout, anemia, DDD (lumbar).   OT comments  [name] admitted with above presents to acute OT down about his overall condition and not feeling he is getting any stronger. He reports he is back to starting all over from where he was back in February. He has potential to get stronger just needs to be encouraged. Session focused on active listening, bed mobility, and EOB mobility. He will continue to benefit from acute OT.  Follow Up Recommendations  SNF;Supervision/Assistance - 24 hour    Equipment Recommendations  Other (comment)(TBD next venue)       Precautions / Restrictions Precautions Precautions: Fall;Cervical Precaution Comments: hemovac Required Braces or Orthoses: Other Brace/Splint;Cervical Brace Knee Immobilizer - Right: On  when out of bed or walking Cervical Brace: Hard collar;At all times Other Brace/Splint: RLE bledsoe locked in extension Restrictions Weight Bearing Restrictions: No       Mobility Bed Mobility Overal bed mobility: Needs Assistance Bed Mobility: Rolling Rolling: +2 for physical assistance;Max assist Sidelying to sit: +2 for physical assistance;Max assist     Sit to sidelying: +2 for physical assistance;Total assist General bed mobility comments: rolling left with hooking RUE, total assist to bend and positing RLE, pt assisting with RUE hooking therapist to assist with rise from side to sitting with HOB 20 degrees. Return to supine total assist to control trunk and bring legs to surface.   Transfers                 General transfer comment: did not attempt as pt finds transfers and standing to be futile attempts at this time    Balance Overall balance assessment: Needs assistance Sitting-balance support: Bilateral upper extremity supported;Feet supported Sitting balance-Leahy Scale: Poor Sitting balance - Comments: EOB grossly 10 min with session focused on propping and transitioning into propping on bil forearms with max assist left to right forearm,  right to left total assist.  Postural control: Posterior lean Standing balance support: Bilateral upper extremity supported                               ADL either performed or assessed with clinical judgement        Vision Patient Visual Report: No change from baseline  Cognition Arousal/Alertness: Awake/alert Behavior During Therapy: Flat affect Overall Cognitive Status: Within Functional Limits for tasks assessed                                 General Comments: pt frustrated with course of treatment throughout admission. He is not interested on progressing to standing or OOB until he can gain strength in his legs and progress sitting mobility                    Pertinent Vitals/ Pain       Pain Assessment: Faces Pain Score: 4  Pain Location: LLE Pain Descriptors / Indicators: Aching Pain Intervention(s): Limited activity within patient's tolerance;Repositioned;Monitored during session     Prior Functioning/Environment              Frequency  Min 2X/week        Progress Toward Goals  OT Goals(current goals can now be found in the care plan section)  Progress towards OT goals: Progressing toward goals     Plan Discharge plan remains appropriate    Co-evaluation    PT/OT/SLP Co-Evaluation/Treatment: Yes Reason for Co-Treatment: Complexity of the patient's impairments (multi-system involvement);For patient/therapist safety PT goals addressed during session: Mobility/safety with mobility;Balance OT goals addressed during session: Strengthening/ROM      AM-PAC PT "6 Clicks" Daily Activity     Outcome Measure   Help from another person eating meals?: A Little Help from another person taking care of personal grooming?: A Little Help from another person toileting, which includes using toliet, bedpan, or urinal?: Total Help from another person bathing (including washing, rinsing, drying)?: A Lot Help from another person to put on and taking off regular upper body clothing?: A Lot Help from another person to put on and taking off regular lower body clothing?: Total 6 Click Score: 12    End of Session Equipment Utilized During Treatment: Cervical collar  OT Visit Diagnosis: Other abnormalities of gait and mobility (R26.89);Muscle weakness (generalized) (M62.81);Pain Pain - part of body: Leg(legs)   Activity Tolerance Patient tolerated treatment well   Patient Left in bed;with call bell/phone within reach           Time: 4098-1191 OT Time Calculation (min): 45 min  Charges: OT General Charges $OT Visit: 1 Visit OT Treatments $Therapeutic Activity: 8-22 mins  Ignacia Palma, OTR/L Acute [employer] Pager  [phone] Office [phone]      Evette Georges 07/10/2018, 12:54 PM

## 2018-07-10 NOTE — Progress Notes (Signed)
Patient ID: [name] [name], male   DOB: 06-05-1966, 52 y.o.   MRN: [id number]   Pt was tentatively scheduled for Bilat LE venous thrombectomy in IR  After review of imaging and chart Dr Miles Costain feels pt is NOT a candidate for this procedure at this time.  If pt cannot anticoagulate after thrombectomy-- would be futile to move forward with the procedure.  If at some point pt is able to use anticoagulation safely-- would reconsider thrombectomy.  Pt is aware Dr Sunnie Nielsen is aware

## 2018-07-10 NOTE — Progress Notes (Signed)
Physical Therapy Treatment Patient Details Name: [name] [name] MRN: [id number] DOB: 18-Apr-1966 Today's Date: 07/10/2018    History of Present Illness 52 y.o. male admitted on 06/06/18 for bil LE wekness and muscle spasm after multiple falls (with resultant R knee meniscus tear placed in bledsoe brace) following a recent ACDF at [facility] in July 2019 (complicated by PE (s/p IVC filter), CIR, and then SNF placement before finally returning home with his parents).  In the ED MRI of c-spine which showed post operative changes and cord edema and possibility of ischemia.  His R knee was aspirated on 06/09/18.  S/p posterior cervical decompression and fusion on 06/14/18. Entire acute course complicated by hypotension in standing (thought to be autonomic) and by finding of multiple LE DVTs (already has an IVC filter in place, but per MDs notes at risk for blocking the filter with these clots).  Pt with post op issues with incisional bleeding s/p 2 evacuations of hematomas and placement of hemovac (06/22/18 and 07/01/18) as well as multiple units of plasma.  Cardiology consulted 07/05/18 due to bradycardia thought to be from his newly started blood thinner.  PMHx of HTN, gout, anemia, DDD (lumbar).    PT Comments    Pt on arrival agitated with lack of progression of his symptoms, lack of further diagnosis and continued loss of strength and function. Pt does not at this time see any point in progressing therapy to OOB or standing when he doesn't have function of his legs. Discussion with pt for agreeable plan and pt accepting of goal to increase his bil UE and bil LE strength from bed level and focus on bed level rolling, side<>sit and sitting balance goals. Until pt can meet this level at min assist he does not want to risk standing and having syncopal episodes or falling. Pt also states he tried the total lift bed and does not see the point in it either. Pt within session working toward functional bed mobility and  progression of activity tolerance. No AROM on LLE, with 2-/5 RLE.     Follow Up Recommendations  SNF;LTACH     Equipment Recommendations  Wheelchair (measurements PT);Wheelchair cushion (measurements PT);Hospital bed;Other (comment)(hoyer lift)    Recommendations for Other Services       Precautions / Restrictions Precautions Precautions: Fall;Cervical Precaution Comments: hemovac Required Braces or Orthoses: Other Brace/Splint;Cervical Brace Knee Immobilizer - Right: On when out of bed or walking Cervical Brace: Hard collar;At all times Other Brace/Splint: RLE bledsoe locked in extension Restrictions Weight Bearing Restrictions: No    Mobility  Bed Mobility Overal bed mobility: Needs Assistance Bed Mobility: Rolling Rolling: +2 for physical assistance;Max assist Sidelying to sit: +2 for physical assistance;Max assist     Sit to sidelying: +2 for physical assistance;Total assist General bed mobility comments: rolling left with hooking RUE, total assist to bend and positing RLE, pt assisting with RUE hooking therapist to assist with rise from side to sitting with HOB 20 degrees. Return to supine total assist to control trunk and bring legs to surface.   Transfers                 General transfer comment: did not attempt as pt finds transfers and standing to be futile attempts at this time  Ambulation/Gait             General Gait Details: Unable at this time.    Stairs             Psychologist, prison and probation services  Modified Rankin (Stroke Patients Only)       Balance Overall balance assessment: Needs assistance Sitting-balance support: Bilateral upper extremity supported;Feet supported Sitting balance-Leahy Scale: Poor Sitting balance - Comments: EOB grossly 10 min with session focused on propping and transitioning into propping on bil forearms with max assist left to right forearm,  right to left total assist.  Postural control: Posterior lean Standing  balance support: Bilateral upper extremity supported                                Cognition Arousal/Alertness: Awake/alert Behavior During Therapy: Flat affect Overall Cognitive Status: Within Functional Limits for tasks assessed                                 General Comments: pt frustrated with course of treatment throughout admission. He is not interested on progressing to standing or OOB until he can gain strength in his legs and progress sitting mobility      Exercises      General Comments        Pertinent Vitals/Pain Pain Score: 4  Pain Location: LLE Pain Descriptors / Indicators: Aching Pain Intervention(s): Limited activity within patient's tolerance;Repositioned;Monitored during session    Home Living                      Prior Function            PT Goals (current goals can now be found in the care plan section) Acute Rehab PT Goals Time For Goal Achievement: 07/24/18 Potential to Achieve Goals: Fair Progress towards PT goals: Goals downgraded-see care plan    Frequency    Min 2X/week      PT Plan Current plan remains appropriate;Frequency needs to be updated    Co-evaluation PT/OT/SLP Co-Evaluation/Treatment: Yes Reason for Co-Treatment: Complexity of the patient's impairments (multi-system involvement);For patient/therapist safety PT goals addressed during session: Mobility/safety with mobility;Balance        AM-PAC PT "6 Clicks" Daily Activity  Outcome Measure  Difficulty turning over in bed (including adjusting bedclothes, sheets and blankets)?: Unable Difficulty moving from lying on back to sitting on the side of the bed? : Unable Difficulty sitting down on and standing up from a chair with arms (e.g., wheelchair, bedside commode, etc,.)?: Unable Help needed moving to and from a bed to chair (including a wheelchair)?: Total Help needed walking in hospital room?: Total Help needed climbing 3-5 steps  with a railing? : Total 6 Click Score: 6    End of Session Equipment Utilized During Treatment: Cervical collar Activity Tolerance: Patient tolerated treatment well Patient left: in bed;with call bell/phone within reach Nurse Communication: Mobility status;Need for lift equipment PT Visit Diagnosis: Other abnormalities of gait and mobility (R26.89);Other symptoms and signs involving the nervous system (R29.898);Muscle weakness (generalized) (M62.81)     Time: 0981-1914 PT Time Calculation (min) (ACUTE ONLY): 43 min  Charges:  $Therapeutic Activity: 8-22 mins                     Burnard Enis Abner Greenspan, PT Acute Rehabilitation Services Pager: [phone] Office: [phone]    Taige Housman B Renesme Kerrigan 07/10/2018, 11:27 AM

## 2018-07-10 NOTE — Progress Notes (Signed)
Neurosurgery Progress Note / Preop Update  Pt began to notice his left hand was losing strength this evening. Stat MRI immediately ordered and completed, shows re-accumulation of his epidural hematoma with cord compression. Discussed w/ pt regarding need for emergent evacuation of epidural hematoma, he would like to proceed. Will give 2U PLTs and DDAVP. Not administering any recombinant factor due to concern for underlying consumptive process with known bilateral segmental PEs. Will take to the OR now for clot evacuation.  [name] [name] 9:44 PM 07/10/18

## 2018-07-11 ENCOUNTER — Encounter (HOSPITAL_COMMUNITY): Payer: Self-pay | Admitting: Neurological Surgery

## 2018-07-11 LAB — CBC WITH DIFFERENTIAL/PLATELET
ABS IMMATURE GRANULOCYTES: 0.04 10*3/uL (ref 0.00–0.07)
BASOS ABS: 0 10*3/uL (ref 0.0–0.1)
BASOS PCT: 0 %
Eosinophils Absolute: 0 10*3/uL (ref 0.0–0.5)
Eosinophils Relative: 0 %
HCT: 25.8 % — ABNORMAL LOW (ref 39.0–52.0)
Hemoglobin: 8 g/dL — ABNORMAL LOW (ref 13.0–17.0)
IMMATURE GRANULOCYTES: 1 %
LYMPHS PCT: 4 %
Lymphs Abs: 0.2 10*3/uL — ABNORMAL LOW (ref 0.7–4.0)
MCH: 30.5 pg (ref 26.0–34.0)
MCHC: 31 g/dL (ref 30.0–36.0)
MCV: 98.5 fL (ref 80.0–100.0)
Monocytes Absolute: 0.3 10*3/uL (ref 0.1–1.0)
Monocytes Relative: 5 %
NEUTROS ABS: 4.7 10*3/uL (ref 1.7–7.7)
NEUTROS PCT: 90 %
PLATELETS: 62 10*3/uL — AB (ref 150–400)
RBC: 2.62 MIL/uL — ABNORMAL LOW (ref 4.22–5.81)
RDW: 16.5 % — AB (ref 11.5–15.5)
WBC: 5.2 10*3/uL (ref 4.0–10.5)
nRBC: 0 % (ref 0.0–0.2)

## 2018-07-11 LAB — GLUCOSE, CAPILLARY
GLUCOSE-CAPILLARY: 239 mg/dL — AB (ref 70–99)
GLUCOSE-CAPILLARY: 326 mg/dL — AB (ref 70–99)
Glucose-Capillary: 306 mg/dL — ABNORMAL HIGH (ref 70–99)
Glucose-Capillary: 368 mg/dL — ABNORMAL HIGH (ref 70–99)

## 2018-07-11 LAB — BPAM PLATELET PHERESIS
BLOOD PRODUCT EXPIRATION DATE: 201910282359
ISSUE DATE / TIME: 201910282137
UNIT TYPE AND RH: 6200

## 2018-07-11 LAB — CBC
HCT: 25.9 % — ABNORMAL LOW (ref 39.0–52.0)
Hemoglobin: 8.1 g/dL — ABNORMAL LOW (ref 13.0–17.0)
MCH: 30.9 pg (ref 26.0–34.0)
MCHC: 31.3 g/dL (ref 30.0–36.0)
MCV: 98.9 fL (ref 80.0–100.0)
NRBC: 0 % (ref 0.0–0.2)
PLATELETS: 87 10*3/uL — AB (ref 150–400)
RBC: 2.62 MIL/uL — AB (ref 4.22–5.81)
RDW: 16.9 % — ABNORMAL HIGH (ref 11.5–15.5)
WBC: 5.5 10*3/uL (ref 4.0–10.5)

## 2018-07-11 LAB — BASIC METABOLIC PANEL
Anion gap: 11 (ref 5–15)
BUN: 38 mg/dL — ABNORMAL HIGH (ref 6–20)
CO2: 23 mmol/L (ref 22–32)
Calcium: 8.8 mg/dL — ABNORMAL LOW (ref 8.9–10.3)
Chloride: 105 mmol/L (ref 98–111)
Creatinine, Ser: 0.93 mg/dL (ref 0.61–1.24)
GFR calc Af Amer: >60 mL/min (ref >60)
GLUCOSE: 378 mg/dL — AB (ref 70–99)
POTASSIUM: 4.6 mmol/L (ref 3.5–5.1)
SODIUM: 139 mmol/L (ref 135–145)

## 2018-07-11 LAB — PREPARE PLATELET PHERESIS: Unit division: 0

## 2018-07-11 LAB — PREPARE FRESH FROZEN PLASMA

## 2018-07-11 LAB — BPAM FFP
Blood Product Expiration Date: 201910312359
ISSUE DATE / TIME: 201910282139
Unit Type and Rh: 5100

## 2018-07-11 LAB — COAG STUDIES INTERP REPORT

## 2018-07-11 LAB — VON WILLEBRAND PANEL
Coagulation Factor VIII: 153 % — ABNORMAL HIGH (ref 56–140)
Ristocetin Co-factor, Plasma: 310 % — ABNORMAL HIGH (ref 50–200)
VON WILLEBRAND ANTIGEN, PLASMA: 582 % — AB (ref 50–200)

## 2018-07-11 LAB — APTT: APTT: 23 s — AB (ref 24–36)

## 2018-07-11 MED ORDER — SODIUM CHLORIDE 0.9% IV SOLUTION
Freq: Once | INTRAVENOUS | Status: AC
  Administered 2018-07-11: 10:00:00 via INTRAVENOUS

## 2018-07-11 MED ORDER — INSULIN GLARGINE 100 UNIT/ML ~~LOC~~ SOLN
15.0000 [IU] | Freq: Every day | SUBCUTANEOUS | Status: DC
  Administered 2018-07-11: 15 [IU] via SUBCUTANEOUS
  Filled 2018-07-11 (×2): qty 0.15

## 2018-07-11 MED FILL — Thrombin (Recombinant) For Soln 20000 Unit: CUTANEOUS | Qty: 1 | Status: AC

## 2018-07-11 NOTE — Op Note (Addendum)
PATIENT: [name] [name]  DAY OF SURGERY: 07/10/18   PRE-OPERATIVE DIAGNOSIS:  Cervical spinal epidural hematoma   POST-OPERATIVE DIAGNOSIS:  Cervical spinal epidural hematoma   PROCEDURE:  Exploration of posterior cervical wound and evacuation of epidural hematoma   SURGEON:  Surgeon(s) and Role:    Jadene Pierini, MD - Primary   ANESTHESIA: ETGA   BRIEF HISTORY: This is a 52yo man who previously has had multiple spine surgeries including post-operative hematoma reaccumulation requiring evacuation. He noticed a change in his left hand function, stat MRI showed reaccumulation of his epidural hematoma and he was taken to the OR for emergent evacuation.    OPERATIVE DETAIL: The patient was taken to the operating room and placed on the OR table in the prone position. A formal time out was performed with two patient identifiers and confirmed the operative site. Anesthesia was induced by the anesthesia team. The right #7 round drain was removed and the prior incision was prepped and draped in a sterile fashion. The prior incision was opened and hematoma as well as fresh, unclotted red blood was encountered. There was active bleeding from the drain site, however it was not clear whether or not this was present prior to or after removal of the drain. The tract of the drain was probed with a penfield #4 and the tip of that instrument was used for monopolar cautery to help obtain hemostasis in the tract. The tunnel pathway of the drain was then stitched closed from the inside of the wound to prevent any further hemorrhage from occurring internally. The hematoma was evacuated completely and hemostasis was obtained. A new drain was inserted in a subfascial location and tunneled to the left posterior aspect of the incision and secured. All instrument and sponge counts were correct, the incision was then closed in layers. The patient was then returned to anesthesia for emergence.    EBL:  50mL   DRAINS:  #9 round subfascial drain with hemovac attached   SPECIMENS: none   Jadene Pierini, MD 07/11/18 11:06 AM

## 2018-07-11 NOTE — Progress Notes (Signed)
Patient returned from OR. Will continue to monitor and treat per MD orders

## 2018-07-11 NOTE — Progress Notes (Signed)
Patient ID: [name] [name], male   DOB: February 26, 1966, 52 y.o.   MRN: [id number] Events of the evening noted... Patient with significant decreased left sided upper and lower extremity strength perioperatively. Has not had significant recovery at this morning.  Grip strength 1 out of 5 , 0-1out of 5left lower extremity, 2-3 out of 5 biceps left arm 4 minus out of 5 strength right upper cavity right lower extremity.  Platelet count this morning in the low 60s we'll transfuse another unit of platelets. Bone marrow biopsy pending this morning.  Should the patient reaccumulate again the only other option we may have is to leave the wounds open and pack it open with a wound VAC. This also has significant incidence of potentially clotting off.

## 2018-07-11 NOTE — Progress Notes (Addendum)
PROGRESS NOTE    [name] [name]  ZDG:[id number] DOB: 05-Dec-1965 DOA: 06/06/2018 PCP: Helane Rima, MD    Brief Narrative: Brief Narrative:  52 year old man with past medical history relevant for gout, hypertension anterior cervical corpectomy of C4 on 03/24/2018 with comp gated postoperative course including development of multiple PEs and DVTs started on anticoagulation and subsequently developed hematoma requiring reexploration on 03/28/2018 status post placement of IVC filter who was discharged to skilled nursing facility and had a complicated course including progressive lower extremity weakness, intermittent fevers of unclear etiology and hospitalizations for "pneumonia". Patient was admitted to this hospital is a transfer from [facility] with MRI of C-spine that showed postoperative changes and cord compression with cord edema and concern for ischemia. Patient is status post posterior cervical decompression laminectomy of C3, C4, C5, removal of spinous processes, foraminotomies on 06/14/2018. On 06/22/2018 patient began to develop worsening left sided paresthesias and weakness and a stat MRI showed evidence of postoperative hematoma. Patient was taken to the OR with evacuation of hematoma. Additionally his postoperative course has been complicated by recurrent fevers,bilateral lower extremity DVTs on ultrasound,migratory arthralgias/arthritis that are thought to be rheumatologic in nature as well as significant orthostasis limiting the amount he can work with physical therapy. His arthralgia has resolved.   Noted to have decreased left hand grip on 10/19, MRI showed large epidural hematoma, patient s/p clot evacuation.  He was place back on anticoagulation argatroban on 10-22. He was notice to be more bradycardic with HR in the 40 on 10-23. Cardiology was consulted, and argatroban was transition to bivalirrubin to avoid bradycardia. His HR improved after argatroban was discontinue.  -HIT panel 0.486 (mildly elevated)- SRA (serotonin release assay) negative. Dr Marin Olp has been helping with patient care. He is recommending to avoid heparin or Lovenox products.   Patient develops weakness of left arm on 10-25. He was take emergently to OR for recurrence of epidural hematoma on 10-25. Anticoagulation was discontinue since 10-25. Dr Saintclair Halsted is recommending for patient to remain off anticoagulation for alt leas 1 week, and if anticoagulation is consider, that will need to be discuss with DR Saintclair Halsted.  Patient the night of 10-28 develops left arm weakness, numbness. Stat MRI was ordered, which confirm recurrent hematoma. Patient was taken to OR for hematoma evacuation 10-28. He has been off anticoagulation since 10-25. Patient will received platelet transfusion 10-29.  For his thrombocytopenia, a Bone marrow biopsy is planned for 10-30. For LE DVT, he has IVC filter. He cant not be on anticoagulation due to recurrent bleeding.     Assessment & Plan:   Principal Problem:   Fever Active Problems:   Myelopathy (HCC)   Benign essential HTN   Cord compression (HCC)   History of pulmonary embolism   Spondylosis, cervical, with myelopathy   Labile blood pressure   Orthostatic hypotension   Leukocytosis   Tachycardia-bradycardia syndrome (HCC)   Near syncope due to orthostatic hypotension Thought to be related to hypovolemia and autonomic dysfunction.  On midodrine and TED hose.  Echocardiogram July 2019 showed a normal systolic function, grade 2 diastolic dysfunction.  No significant valve abnormalities are present Evaluated by cardiology. Continue with midodrine. Abdominal binder ordered.  Stable.   Bradycardia;  Notice have lower HR 10-23. Asymptomatic.  Cardiology consulted, for both bradycardia, and syncope and orthostatic hypotension.  No indication for pacer. Thought argatroban might be causing bradycardia.  Stable.   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  -IV antibiotics per sx. Needs to be on IV antibiotics a tleast until drain is in place.  -Discussed with Dr Saintclair Halsted 10-26;  hold anticoagulation for at least 1 week. If anticoagulation is going to be consider, this will need to be discussed with Dr Saintclair Halsted. Patient has required multiples surgical evacuation of epidural hematoma. Difficult situation with patient recent thrombotic event.  Develops left arm weakness 10-25- and 10-28, he was take to OR for epidural hematoma evacuation.   Polyarthralgia/persistent fever -Patient had symptoms in the shoulders and ankles, along with a history of gout and pain in his knees.  He underwent arthrocentesis however no evidence of infection was found. -He was noted to have elevated inflammatory markers including CCP, CRP.  However ANCA, aldolase, ANA were negative. -Previous rounding physician discussed with Dr. Kathlene November, rheumatologist, he will follow-up with the patient as an outpatient. -Patient was placed on prednisone however currently on dexamethasone (neurosurgery) Fever resolved./  Denies joint pain.   Thrombocytopenia/coagulopathy -Cardiolipin antibody negative -Lupus anticoagulant, homocysteine level, factor VIII within normal limits -Protein S activity 60 (low) -Protein C total >200; Activity 199 (high)  -HIT panel 0.486 (mildly elevated)- SRA (serotonin release assay) negative -immature platelet fraction WNL -Was given 2u platelets on 10/19 in the OR  Platelet stable. And 2 units 10-25 for OR. Platelet transfusion  1-29 Dr Marin Olp recommending Bone marrow biopsy.  Bone Marrow Biopsy tomorrow.  Dr Marin Olp aware of recent events. He will see patient again 10-30.  Recent PE /DVT in setting of spine surgery -Status post IVC filter placement on March 28, 2018 -Patient was on apixaban however changed over to IV heparin.  Anticoagulation however was discontinued for surgery.  Patient also noted to have thrombocytopenia. -Vascular DVT US 06/26/2018 showed bilateral lower extremity DVTs likely acute -Case was discussed with  vascular surgery, Dr. Trula Slade, patient is out of the 3 week window for treatment, he would have to get TPA, which is contraindicated in a bleeding patient.  -Bivalirudin discontinue by neurosurgery. Serotonin release assay negative.  -Discussed with Dr Saintclair Halsted hold anticoagulation for at least 1 week. If anticoagulation is going to be consider, this will need to be discussed with Dr Saintclair Halsted. Patient has required multiples surgical evacuation of epidural hematoma. Difficult situation with patient recent thrombotic event.  Decreased movement of left LE 10-28, Neurosurgery aware. . Plan to get [place] rule out DVT.  Discussed with Dr Shick 10-28, thrombectomy only will not be enough to take care of B/L thrombus, patient will require anticoagulation, TPA. Patient is too high risk for bleeding, with recurrent epidural hematoma. Patient can not received anticoagulation at this time. Also Dr Saintclair Halsted will need to clear patient to be on anticoagulation.    Normocytic anemia Monitor hb.  Hb at 8 today, repeat labs in am.   Gout -Does not seem to be active at this time, continue Uloric   Mild acute renal failure; resolved.   HTN; amlodipine discontinue due to orthostatic hypotension and edema.   Gout;  Continue with uloric.   Anemia;  was given 1u PRBC on 10/19 in OR Will repeat Hb tonight. Transfusion as needed   Hyperglycemia; from  steroids.  On lantus, increase to 15 units.  SSI.   DVT prophylaxis:  argatroban.  Code Status: full code.  Family Communication: care discussed with patient,  Disposition Plan: remain in the hospital for treatment of Blood clot, monitor for development of hematoma.     Consultants:   Neurosurgery  Hematology  Cardiology    Procedures: On 10/2 1. posterior cervical decompressive laminectomy at C3, C4, C5 with removal of the spinous process and complete laminectomy and foraminotomies of the C3, C4, C5 nerve roots. 2. Posterior cervical fusion with lateral mass screws at C3-C4-C5 and pars screws at C2 lies in the globus ellipseLateral mass screw system 3. Posterior lateral arthrodesis C2-C5 utilizing locally harvested autograft mixed with vivigen  06/22/2018: Procedure reexploration posterior cervical wound for evacuation of epidural hematoma  06/26/2018 DVT ultrasound:Right: Findings consistent with acute deep vein thrombosis involving the right common femoral vein, right femoral vein, right popliteal vein, right posterior tibial vein, and right peroneal vein. Thrombosis extends proximally into external iliac vein.  Proximal iliac vein not visualized. Left: Findings consistent with acute deep vein thrombosis involving the left common femoral vein, left femoral vein, left popliteal vein, left posterior tibial vein, and left peroneal vein. Findings consistent with acute superficial vein thrombosis  involving the left great saphenous vein. Thrombosis extends proximally into external iliac vein. Proximal iliac vein not visualized.  07/01/18 Clot evacuation   RUE doppler 07/02/18    Antimicrobials:  Ancef.   Subjective: He is very frustrated, with his condition and multiples surgery, clot evacuation. He is very sad, depressed.  He report not significant improvement left arm strenghn.  He wants to get sample of the blood clots. To safe them,.   Objective: Vitals:   07/10/18 2354 07/11/18 0010 07/11/18 0300 07/11/18 0753  BP: 114/78  119/74 123/78 130/86  Pulse: 92 87 70 91  Resp: '14 14  13  ' Temp: (!) 97.5 F (36.4 C) 98 F (36.7 C) 97.8 F (36.6 C) 97.7 F (36.5 C)  TempSrc:  Oral Oral Oral  SpO2: 96% 96% 93% 99%  Weight:      Height:        Intake/Output Summary (Last 24 hours) at 07/11/2018 0928 Last data filed at 07/10/2018 2354 Gross per 24 hour  Intake 3720.33 ml  Output 1131 ml  Net 2589.33 ml   Filed Weights   06/14/18 1151 06/22/18 1641 07/07/18 1000  Weight: 123.8 kg 123.8 kg 123.8 kg    Examination:  General exam: flat affect, NAD Respiratory system: CTA Cardiovascular system: S 1, S 2 RRR Gastrointestinal system: BS present, soft, nt Central nervous system: alert. Right side stable. Left arm 1/5.  Extremities: Bilateral LE edema.   Skin: No rashes.     Data Reviewed: I have personally reviewed following labs and imaging studies  CBC: Recent Labs  Lab 07/08/18 0500 07/09/18 0515 07/09/18 0834 07/10/18 0500 07/11/18 0605  WBC 6.9 6.9 6.5 5.4 5.2  NEUTROABS  --   --  5.7  --  4.7  HGB 8.9* 9.6* 9.6* 9.3* 8.0*  HCT 29.4* 31.5* 31.7* 31.0* 25.8*  MCV 98.7 98.7 99.1 98.4 98.5  PLT 64* 68* 60* 66* 62*   Basic Metabolic Panel: Recent Labs  Lab 07/05/18 0513 07/07/18 0935 07/08/18 0500 07/09/18 0515 07/10/18 0747  NA 139 138 140 138 139  K 4.7 4.3 4.6 4.7 4.4  CL 106 106 105 106 106  CO2 '25 24 27 26 26  ' GLUCOSE 227* 308* 292* 315* 296*  BUN 38* 39* 38* 41* 43*  CREATININE 0.83 0.94 0.85 0.90 1.05  CALCIUM 9.0 8.9 9.2 9.1 9.0  MG 2.1  --   --   --   --    GFR: Estimated Creatinine Clearance: 107 mL/min (by C-G formula based on SCr of 1.05 mg/dL). Liver Function Tests: No results for input(s): AST, ALT, ALKPHOS, BILITOT, PROT, ALBUMIN in the last 168 hours. No results for input(s): LIPASE, AMYLASE in the last 168 hours. No results for input(s): AMMONIA in the last 168 hours. Coagulation Profile: Recent Labs  Lab 07/07/18 0935 07/09/18 0834  INR 1.70 1.33    Cardiac Enzymes: Recent Labs  Lab 07/05/18 1111 07/05/18 1612 07/05/18 2219  TROPONINI <0.03 <0.03 <0.03   BNP (last 3 results) No results for input(s): PROBNP in the last 8760 hours. HbA1C: No results for input(s): HGBA1C in the last 72 hours. CBG: Recent Labs  Lab 07/09/18 2100 07/10/18 0725 07/10/18 1215 07/10/18 1613 07/11/18 0800  GLUCAP 296* 278* 288* 242* 239*   Lipid Profile: No results for input(s): CHOL, HDL, LDLCALC, TRIG, CHOLHDL, LDLDIRECT in the last 72 hours. Thyroid Function Tests: No results for input(s): TSH, T4TOTAL, FREET4, T3FREE, THYROIDAB in the last 72 hours. Anemia Panel: No results for input(s): VITAMINB12, FOLATE, FERRITIN, TIBC, IRON, RETICCTPCT in the last 72 hours. Sepsis Labs: No results for input(s): PROCALCITON, LATICACIDVEN in the last 168 hours.  No results found for this or any previous visit (from the past 240 hour(s)).       Radiology Studies: Mr Cervical Spine Wo Contrast  Result Date: 07/10/2018 CLINICAL DATA:  Worsening neurologic function. Status post posterior ACDF June 14, 2018. Status post recurrent paraspinal hematoma evacuation. EXAM: MRI CERVICAL SPINE WITHOUT CONTRAST TECHNIQUE: Multiplanar, multisequence MR imaging of the cervical spine was performed. No intravenous contrast was administered. COMPARISON:  MRI cervical spine July 01, 2018 FINDINGS: ALIGNMENT: Straightened cervical lordosis.  No malalignment. Status post C3 through C5 corpectomy and ACDF. C2 through C5 facet screws. Nonsurgically altered disc morphologies are normal. No suspicious bone marrow signal. CORD:Cord compression C2-3 through C5 with cord edema similar to prior examination. No syrinx. POSTERIOR FOSSA, VERTEBRAL ARTERIES, PARASPINAL TISSUES: Heterogeneous T2, low T1 5.4 x 8 x 9.9 cm (transverse by AP by cc) paraspinal fluid collection within surgical bed, collection is larger than prior examination. New drain within the fluid collection with  side ports within surgical bed C2-3 through C4-5. similar severe thecal sac effacement C2-3 through C5-6, AP dimension of the canal is 3-4 mm. DISC LEVELS: C2-3: No disc bulge, canal stenosis nor neural foraminal narrowing. C3-4: ACDF and posterior decompression. Fluid collection narrows the thecal sac to 3 mm. Limited assessment of neural foramen, likely moderate neural foraminal narrowing on the LEFT. C4-5: ACDF and posterior decompression. Fluid collection narrows the thecal sac to 3 mm. Limited assessment of neural foramen, likely moderate to severely narrowed on the LEFT. C5-6: ACDF and posterior decompression. Fluid collection narrows the thecal sac to 4 mm. Mild RIGHT neural foraminal narrowing. C6-7 and C7-T1: No disc bulge, canal stenosis nor neural foraminal narrowing. IMPRESSION: 1. Enlarging 5.4 x 8 x 9.9 cm paraspinal fluid collection along surgical approach and within surgical bed. Drainage catheter within fluid collection (potential pseudomeningocele). 2. Similar severe thecal sac effacement and cord compression C2-3 through C5 with cord edema, no syrinx. 3. Status post C3 through C5 ACDF, C4 corpectomy. C2 through C5 facet screws. Electronically Signed   By: Elon Alas M.D.   On: 07/10/2018 21:02  Scheduled Meds: . sodium chloride   Intravenous Once  . sodium chloride   Intravenous Once  . sodium chloride   Intravenous Once  . dexamethasone  8 mg Intravenous Q6H  . docusate sodium  100 mg Oral Daily  . doxycycline  100 mg Oral Q12H  . enalaprilat  1.25 mg Intravenous Once  . febuxostat  40 mg Oral Daily  . gabapentin  300 mg Oral TID  . Gerhardt's butt cream   Topical BID  . Influenza vac split quadrivalent PF  0.5 mL Intramuscular Tomorrow-1000  . insulin aspart  0-5 Units Subcutaneous QHS  . insulin aspart  0-9 Units Subcutaneous TID WC  . insulin glargine  10 Units Subcutaneous QHS  . methocarbamol  750 mg Oral QID  . midodrine  5 mg Oral TID WC  . nystatin  5  mL Oral TID AC & HS  . senna-docusate  1 tablet Oral QHS  . sodium chloride flush  10-40 mL Intracatheter Q12H   Continuous Infusions: .  ceFAZolin (ANCEF) IV 2 g (07/11/18 0926)  . lactated ringers 10 mL/hr at 07/10/18 0900     LOS: 34 days    Time spent: 35 minutes.     Elmarie Shiley, MD [facility] Pager [phone]  If 7PM-7AM, please contact night-coverage [email or web address] Password [email or web address] 07/11/2018, 9:28 AM

## 2018-07-11 NOTE — Progress Notes (Signed)
PT Cancellation Note  Patient Details Name: [name] [name] MRN: [id number] DOB: August 16, 1966   Cancelled Treatment:    Reason Eval/Treat Not Completed: Patient declined, no reason specified.  PT checked on pt to see if he was willing/able to mobilize today given recent events of another spine surgery.  He seems very down, frustrated and angry.  I sat with him and let him talk.  He thought his bx was today (RN informed me that it will be tomorrow, so I let him know).  Pt is agreeable for PT to check back on him tomorrow.    Thanks,    Rollene Rotunda. Blayze Haen, PT, DPT  Acute Rehabilitation [phone] pager #[phone] office   07/11/2018, 4:04 PM

## 2018-07-11 NOTE — Progress Notes (Signed)
Patient ID: [name] [name], male   DOB: [birth year], 52 y.o.   MRN: [id number]   Cytology unavailable to [place] today Reschedule BM bx to 10/30  Pt aware

## 2018-07-11 NOTE — Progress Notes (Signed)
Inpatient Diabetes Program Recommendations  AACE/ADA: New Consensus Statement on Inpatient Glycemic Control (2015)  Target Ranges:  Prepandial:   less than 140 mg/dL      Peak postprandial:   less than 180 mg/dL (1-2 hours)      Critically ill patients:  140 - 180 mg/dL   Lab Results  Component Value Date   GLUCAP 239 (H) 07/11/2018    Review of Glycemic Control Results for [name] [name], [name] [name] (MRN [id number]) as of 07/11/2018 11:02  Ref. Range 07/10/2018 12:15 07/10/2018 16:13 07/11/2018 08:00  Glucose-Capillary Latest Ref Range: 70 - 99 mg/dL 409 (H) 811 (H) 914 (H)   Diabetes history:None Outpatient Diabetes medications:None Current orders for Inpatient glycemic control: Novolog sensitive tid with meals and HS, Lantus 10 units QHS Decadron 8 mg Q6H  Inpatient Diabetes Program Recommendations:  In the setting of steroids, consider increasing Lantus to 18 units QHS (123 kg x 0.2). Patient missed dose on 10/28 due to patient being in the OR. Additionally, since patient is NPO consider switching correction to Novolog 0-9 units Q4H.  Thanks, Lujean Rave, MSN, RNC-OB Diabetes Coordinator [phone] (8a-5p)

## 2018-07-12 ENCOUNTER — Inpatient Hospital Stay (HOSPITAL_COMMUNITY)

## 2018-07-12 LAB — PREPARE PLATELET PHERESIS: Unit division: 0

## 2018-07-12 LAB — CBC
HEMATOCRIT: 24 % — AB (ref 39.0–52.0)
HEMOGLOBIN: 7.4 g/dL — AB (ref 13.0–17.0)
MCH: 30.5 pg (ref 26.0–34.0)
MCHC: 30.8 g/dL (ref 30.0–36.0)
MCV: 98.8 fL (ref 80.0–100.0)
NRBC: 0 % (ref 0.0–0.2)
Platelets: 76 10*3/uL — ABNORMAL LOW (ref 150–400)
RBC: 2.43 MIL/uL — ABNORMAL LOW (ref 4.22–5.81)
RDW: 16.7 % — AB (ref 11.5–15.5)
WBC: 5.8 10*3/uL (ref 4.0–10.5)

## 2018-07-12 LAB — BASIC METABOLIC PANEL
ANION GAP: 6 (ref 5–15)
BUN: 36 mg/dL — ABNORMAL HIGH (ref 6–20)
CHLORIDE: 107 mmol/L (ref 98–111)
CO2: 27 mmol/L (ref 22–32)
Calcium: 8.7 mg/dL — ABNORMAL LOW (ref 8.9–10.3)
Creatinine, Ser: 0.75 mg/dL (ref 0.61–1.24)
GFR calc non Af Amer: >60 mL/min (ref >60)
GLUCOSE: 298 mg/dL — AB (ref 70–99)
POTASSIUM: 4.1 mmol/L (ref 3.5–5.1)
Sodium: 140 mmol/L (ref 135–145)

## 2018-07-12 LAB — PLATELET FUNCTION ASSAY: Collagen / Epinephrine: 151 seconds (ref 0–193)

## 2018-07-12 LAB — GLUCOSE, CAPILLARY
Glucose-Capillary: 287 mg/dL — ABNORMAL HIGH (ref 70–99)
Glucose-Capillary: 257 mg/dL — ABNORMAL HIGH (ref 70–99)
Glucose-Capillary: 229 mg/dL — ABNORMAL HIGH (ref 70–99)
Glucose-Capillary: 307 mg/dL — ABNORMAL HIGH (ref 70–99)

## 2018-07-12 LAB — APTT: aPTT: 24 seconds (ref 24–36)

## 2018-07-12 LAB — BPAM PLATELET PHERESIS
BLOOD PRODUCT EXPIRATION DATE: 201910292359
ISSUE DATE / TIME: 201910291021
UNIT TYPE AND RH: 5100

## 2018-07-12 LAB — PROTIME-INR
INR: 1.29
Prothrombin Time: 15.9 seconds — ABNORMAL HIGH (ref 11.4–15.2)

## 2018-07-12 LAB — VON WILLEBRAND FACTOR MULTIMER

## 2018-07-12 LAB — FIBRINOGEN: FIBRINOGEN: 102 mg/dL — AB (ref 210–475)

## 2018-07-12 MED ORDER — CEFAZOLIN SODIUM-DEXTROSE 2-4 GM/100ML-% IV SOLN
2.0000 g | Freq: Three times a day (TID) | INTRAVENOUS | Status: DC
  Administered 2018-07-12 – 2018-07-15 (×8): 2 g via INTRAVENOUS
  Filled 2018-07-12 (×9): qty 100

## 2018-07-12 MED ORDER — SODIUM CHLORIDE 0.9% IV SOLUTION
Freq: Once | INTRAVENOUS | Status: AC
  Administered 2018-07-13: via INTRAVENOUS

## 2018-07-12 MED ORDER — FENTANYL CITRATE (PF) 100 MCG/2ML IJ SOLN
INTRAMUSCULAR | Status: AC | PRN
  Administered 2018-07-12: 25 ug via INTRAVENOUS

## 2018-07-12 MED ORDER — INSULIN GLARGINE 100 UNIT/ML ~~LOC~~ SOLN
20.0000 [IU] | Freq: Every day | SUBCUTANEOUS | Status: DC
  Administered 2018-07-13: 20 [IU] via SUBCUTANEOUS
  Filled 2018-07-12: qty 0.2

## 2018-07-12 MED ORDER — MIDAZOLAM HCL 2 MG/2ML IJ SOLN
INTRAMUSCULAR | Status: AC
  Filled 2018-07-12: qty 2

## 2018-07-12 MED ORDER — LIDOCAINE HCL 1 % IJ SOLN
INTRAMUSCULAR | Status: AC
  Filled 2018-07-12: qty 20

## 2018-07-12 MED ORDER — MIDAZOLAM HCL 2 MG/2ML IJ SOLN
INTRAMUSCULAR | Status: AC | PRN
  Administered 2018-07-12: 1 mg via INTRAVENOUS

## 2018-07-12 MED ORDER — FENTANYL CITRATE (PF) 100 MCG/2ML IJ SOLN
INTRAMUSCULAR | Status: AC
  Filled 2018-07-12: qty 2

## 2018-07-12 MED ORDER — VITAMIN K1 10 MG/ML IJ SOLN
10.0000 mg | Freq: Once | INTRAMUSCULAR | Status: AC
  Administered 2018-07-12: 10 mg via SUBCUTANEOUS
  Filled 2018-07-12: qty 1

## 2018-07-12 NOTE — Anesthesia Postprocedure Evaluation (Signed)
Anesthesia Post Note  Patient: Tavyn Simek  Procedure(s) Performed: POSTERIOR CERVICAL HEMATOMA EVACUATION (N/A Neck)     Patient location during evaluation: PACU Anesthesia Type: General Level of consciousness: awake and alert Pain management: pain level controlled Vital Signs Assessment: post-procedure vital signs reviewed and stable Respiratory status: spontaneous breathing, nonlabored ventilation, respiratory function stable and patient connected to nasal cannula oxygen Cardiovascular status: blood pressure returned to baseline and stable Postop Assessment: no apparent nausea or vomiting Anesthetic complications: no    Last Vitals:  Vitals:   07/12/18 0400 07/12/18 0734  BP: 115/77 121/89  Pulse: (!) 102 86  Resp: (!) 22 19  Temp: 37.2 C 36.6 C  SpO2: 97% 99%    Last Pain:  Vitals:   07/12/18 0734  TempSrc: Oral  PainSc:                  Kennieth Rad

## 2018-07-12 NOTE — Progress Notes (Signed)
Unfortunately, [name] [name] needed another surgery.  I am totally puzzled as to why he has no bleeding during surgery but he seems to have bleeding afterwards.  So far, we have not been able to uncover any obvious bleeding disorder with him.  I did send off von Willebrand levels and they were okay.  He has a normal PT and PTT.  It would be highly unusual for a platelet count of 60,000 to cause this amount of bleeding but I cannot totally discount that possibility.  He goes for a bone marrow biopsy today.  I keep going over his medications.  I do not see any medication that he is on that would promote bleeding.  1 highly, highly, highly unlikely cause for this current problem would be a variant of TTP.  As such, I am going to send off the ADAMTS13 assay to see if this is low.  He has horrible hyperglycemia.  I am sure this is from the steroids that he is taking.  He has normal renal function.  He is trying to have a good attitude.  I have to give him so much credit for trying all that we asked him to do.  We are really put him through quite a lot.  I hate that he keeps requiring surgeries because of oozing.  I have to give neurosurgery a ton of credit for all that they are doing for him.  All of his vital signs are stable.  I cannot find any bleeding on him.  He did have a ultrasound of his legs.  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  We will have to see what the bone marrow shows.  I am going to send off platelet function assays on him.  Again I would think that they would be okay.  He does not have bleeding problems during surgery he just seems to be after surgery.  He has never had a problem before.  He has had no problems with bleeding elsewhere.  I also wonder if there is not some possible issue with the actual blood vessel architecture.  I am not sure there is any way to prove this outside of  doing a biopsy of a blood vessel.  Again there is no family history of bleeding.  We will continue to do our best to help him and follow him along.  I really love talking to him.  Again he served our country.  He is a true [place] hero for being a [employer].  [name], MD  Psalm 121:3-4

## 2018-07-12 NOTE — Procedures (Signed)
R iliac bone marrow aspirate and core EBL 0 Comp 0

## 2018-07-12 NOTE — Progress Notes (Addendum)
Inpatient Diabetes Program Recommendations  AACE/ADA: New Consensus Statement on Inpatient Glycemic Control (2015)  Target Ranges:  Prepandial:   less than 140 mg/dL      Peak postprandial:   less than 180 mg/dL (1-2 hours)      Critically ill patients:  140 - 180 mg/dL   Lab Results  Component Value Date   GLUCAP 287 (H) 07/12/2018    Review of Glycemic Control Results for KRISTA, SOM (MRN [id number]) as of 07/12/2018 10:39  Ref. Range 07/11/2018 08:00 07/11/2018 12:00 07/11/2018 17:02 07/11/2018 21:42 07/12/2018 07:38  Glucose-Capillary Latest Ref Range: 70 - 99 mg/dL 829 (H) 562 (H) 130 (H) 368 (H) 287 (H)   Diabetes history:None Outpatient Diabetes medications:None Current orders for Inpatient glycemic control: Novolog sensitive tid with meals and HS, Lantus 10 units QHS Decadron 8 mg Q6H  Inpatient Diabetes Program Recommendations:  In the setting of steroids, consider increasing Lantus to 24 units QHS (123 kg x 0.2). Additionally, since patient is NPO consider switching correction to Novolog 0-9 units Q4H.  If diet is to resume, consider carb modified instead of regular.   Thanks, Lujean Rave, MSN, RNC-OB Diabetes Coordinator [phone] (8a-5p)

## 2018-07-12 NOTE — Progress Notes (Signed)
PROGRESS NOTE    [name] [name]  XAJ:[id number] DOB: May 08, 1966 DOA: 06/06/2018 PCP: Helane Rima, MD    Brief Narrative: Brief Narrative:  52 year old man with past medical history relevant for gout, hypertension anterior cervical corpectomy of C4 on 03/24/2018 with comp gated postoperative course including development of multiple PEs and DVTs started on anticoagulation and subsequently developed hematoma requiring reexploration on 03/28/2018 status post placement of IVC filter who was discharged to skilled nursing facility and had a complicated course including progressive lower extremity weakness, intermittent fevers of unclear etiology and hospitalizations for "pneumonia". Patient was admitted to this hospital is a transfer from [facility] with MRI of C-spine that showed postoperative changes and cord compression with cord edema and concern for ischemia. Patient is status post posterior cervical decompression laminectomy of C3, C4, C5, removal of spinous processes, foraminotomies on 06/14/2018. On 06/22/2018 patient began to develop worsening left sided paresthesias and weakness and a stat MRI showed evidence of postoperative hematoma. Patient was taken to the OR with evacuation of hematoma. Additionally his postoperative course has been complicated by recurrent fevers,bilateral lower extremity DVTs on ultrasound,migratory arthralgias/arthritis that are thought to be rheumatologic in nature as well as significant orthostasis limiting the amount he can work with physical therapy. His arthralgia has resolved.   Noted to have decreased left hand grip on 10/19, MRI showed large epidural hematoma, patient s/p clot evacuation.  He was place back on anticoagulation argatroban on 10-22. He was notice to be more bradycardic with HR in the 40 on 10-23. Cardiology was consulted, and argatroban was transition to bivalirrubin to avoid bradycardia. His HR improved after argatroban was discontinue.  -HIT panel 0.486 (mildly elevated)- SRA (serotonin release assay) negative. Dr Marin Olp has been helping with patient care. He is recommending to avoid heparin or Lovenox products.   Patient develops weakness of left arm on 10-25. He was take emergently to OR for recurrence of epidural hematoma on 10-25. Anticoagulation was discontinue since 10-25. Dr Saintclair Halsted is recommending for patient to remain off anticoagulation for alt leas 1 week, and if anticoagulation is consider, that will need to be discuss with DR Saintclair Halsted.  Patient the night of 10-28 develops left arm weakness, numbness. Stat MRI was ordered, which confirm recurrent hematoma. Patient was taken to OR for hematoma evacuation 10-28. He has been off anticoagulation since 10-25. Patient will received platelet transfusion 10-29.  For his thrombocytopenia, a Bone marrow biopsy is planned for 10-30. For LE DVT, he has IVC filter. He cant not be on anticoagulation due to recurrent bleeding.     Assessment & Plan:   Principal Problem:   Fever Active Problems:   Myelopathy (HCC)   Benign essential HTN   Cord compression (HCC)   History of pulmonary embolism   Spondylosis, cervical, with myelopathy   Labile blood pressure   Orthostatic hypotension   Leukocytosis   Tachycardia-bradycardia syndrome (HCC)   Near syncope due to orthostatic hypotension Thought to be related to hypovolemia and autonomic dysfunction.  On midodrine and TED hose.  Echocardiogram July 2019 showed a normal systolic function, grade 2 diastolic dysfunction.  No significant valve abnormalities are present Evaluated by cardiology. Continue with midodrine. Abdominal binder.  Remains stable for the most part.  Bradycardia Noticed to have lower HR on 10-23. Asymptomatic.  Cardiology consulted, for both bradycardia, and syncope and orthostatic hypotension.  No indication for pacer. Thought argatroban might be causing bradycardia.  Telemetry shows normal heart rate  today.  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  -IV antibiotics per sx. Needs to be on IV antibiotics a tleast until drain is in place.  -Patient has required multiples surgical evacuation of epidural hematoma. Difficult situation with patient recent thrombotic event. -He developed left arm weakness 10-25- and 10-28, he was take to OR for epidural hematoma evacuation.  Neurosurgery continues to follow.  Polyarthralgia/persistent fever -At the time of admission patient had symptoms in the shoulders and ankles, along with a history of gout and pain in his knees.  He underwent arthrocentesis however no evidence of infection was found. -He was noted to have elevated inflammatory markers including CCP, CRP.  However ANCA, aldolase, ANA were negative. -Previous rounding physicians have discussed with Dr. Kathlene November, rheumatologist, he will follow-up with the patient as an outpatient. -Patient was placed on prednisone however currently on dexamethasone (neurosurgery) Fever has resolved.  Noted to be on doxycycline since 10/18.  Reason is not entirely clear.  Discontinued.  Thrombocytopenia/coagulopathy -Cardiolipin antibody negative -Lupus anticoagulant, homocysteine level, factor VIII within normal limits -Protein S activity 60 (low) -Protein C total >200; Activity 199 (high)  -HIT panel 0.486 (mildly elevated)- SRA (serotonin release assay) negative -immature platelet fraction WNL -Was given 2u platelets on 10/19 in the OR. And 2 units 10-25 for OR. Platelet transfusion 10/29 Dr Marin Olp recommending  Bone marrow biopsy.  This was done this morning.  Recent PE /DVT in setting of spine surgery -Status post IVC filter placement on March 28, 2018 -Patient was on apixaban and changed over to IV heparin.  Anticoagulation however was discontinued for surgery.  Patient also noted to have thrombocytopenia. -Vascular DVT US 06/26/2018 showed bilateral lower extremity DVTs likely acute -Case was discussed with  vascular surgery, Dr. Trula Slade, patient is out of the 3 week window for treatment, he would have to get TPA, which is contraindicated in a bleeding patient.  -Bivalirudin discontinue by neurosurgery. Serotonin release assay negative.  - Patient has required multiple surgical evacuation of epidural hematoma. Difficult situation with patient recent thrombotic event.  Discussed with Dr Shick 10-28, thrombectomy only will not be enough to take care of B/L thrombus, patient will require anticoagulation, TPA. Patient is too high risk for bleeding, with recurrent epidural hematoma. Patient can not received anticoagulation at this time. Also Dr Saintclair Halsted will need to clear patient to be on anticoagulation.   Normocytic anemia Hemoglobin noted to be 7.4 today.  No evidence of overt bleeding.  Continue to monitor.  May need to be transfused.  Gout -Does not seem to be active at this time, continue Uloric  Mild acute renal failure Resolved.   Essential hypertension  Amlodipine discontinue due to orthostatic hypotension and edema.   Hyperglycemia; from steroids.  CBGs remain poorly controlled.  Will increase the dose of Lantus to 20 units. SSI.  DVT prophylaxis: TED's.  No anticoagulants due to frequent epidural bleeding Code Status: full code.  Family Communication: discussed with patient,  Disposition Plan: remain in the hospital for treatment of Blood clot, monitor for development of hematoma.     Consultants:   Neurosurgery  Hematology  Cardiology    Procedures: On 10/2 1. posterior  cervical decompressive laminectomy at C3, C4, C5 with removal of the spinous process and complete laminectomy and foraminotomies of the C3, C4, C5 nerve roots. 2. Posterior cervical fusion with lateral mass screws at C3-C4-C5 and pars screws at C2 lies in the globus ellipseLateral mass screw system 3. Posterior lateral arthrodesis C2-C5 utilizing locally harvested autograft mixed with vivigen  06/22/2018: Procedure reexploration posterior cervical wound for evacuation of epidural hematoma  06/26/2018 DVT ultrasound:Right: Findings consistent with acute deep vein thrombosis involving the right common femoral vein, right femoral vein, right popliteal vein, right posterior tibial vein, and right peroneal vein. Thrombosis extends proximally into external iliac vein.  Proximal iliac vein not visualized. Left: Findings consistent with acute deep vein thrombosis involving the left common femoral vein, left femoral vein, left popliteal vein, left posterior tibial vein, and left peroneal vein. Findings consistent with acute superficial vein thrombosis  involving the left great saphenous vein. Thrombosis extends proximally into external iliac vein. Proximal iliac vein not visualized.  07/01/18 Clot evacuation   RUE doppler 07/02/18    Antimicrobials:  Ancef.   Subjective: Patient remains very frustrated.  Continues to have neck pain.  He is very concerned about his blood clots.    Objective: Vitals:   07/12/18 1055 07/12/18 1100 07/12/18 1105 07/12/18 1147  BP: 116/86 121/84 116/83 117/84  Pulse: 80 79 81 79  Resp: _0 Temp:    97.9 F (36.6 C)  TempSrc:    Oral  SpO2: 100% 100% 100% 100%  Weight:      Height:        Intake/Output Summary (Last 24 hours) at 07/12/2018 1416 Last data filed at 07/12/2018 0400 Gross per 24 hour  Intake -  Output 775 ml  Net -775 ml   Filed Weights   06/14/18 1151 06/22/18 1641 07/07/18 1000  Weight: 123.8 kg 123.8 kg 123.8 kg     Examination:  General exam: No distress. Respiratory system: Normal effort.  Clear to auscultation bilaterally Cardiovascular system: S1-S2 is normal regular.  No S3-S4.  No rubs murmurs or bruit Gastrointestinal system: Abdomen soft.  Nontender nondistended.    Data Reviewed: I have personally reviewed following labs and imaging studies  CBC: Recent Labs  Lab 07/09/18 0834 07/10/18 0500 07/11/18 0605 07/11/18 1854 07/12/18 0500  WBC 6.5 5.4 5.2 5.5 5.8  NEUTROABS 5.7  --  4.7  --   --   HGB 9.6* 9.3* 8.0* 8.1* 7.4*  HCT 31.7* 31.0* 25.8* 25.9* 24.0*  MCV 99.1 98.4 98.5 98.9 98.8  PLT 60* 66* 62* 87* 76*   Basic Metabolic Panel: Recent Labs  Lab 07/08/18 0500 07/09/18 0515 07/10/18 0747 07/11/18 1350 07/12/18 0500  NA 140 138 139 139 140  K 4.6 4.7 4.4 4.6 4.1  CL 105 106 106 105 107  CO2 _1 GLUCOSE 292* 315* 296* 378* 298*  BUN 38* 41* 43* 38* 36*  CREATININE 0.85 0.90 1.05 0.93 0.75  CALCIUM 9.2 9.1 9.0 8.8* 8.7*   GFR: Estimated Creatinine Clearance: 140.4 mL/min (by C-G formula based on SCr of 0.75 mg/dL).  Coagulation Profile: Recent Labs  Lab 07/07/18 0935 07/09/18 0834 07/12/18 0500  INR 1.70 1.33 1.29   Cardiac Enzymes: Recent Labs  Lab 07/05/18 1612 07/05/18 2219  TROPONINI <0.03 <0.03   CBG: Recent Labs  Lab 07/11/18 1200 07/11/18 1702 07/11/18 2142 07/12/18 0738 07/12/18 1146  GLUCAP 306* 326* 368* 287* 257*      Radiology  Studies: Mr Cervical Spine Wo Contrast  Result Date: 07/10/2018 CLINICAL DATA:  Worsening neurologic function. Status post posterior ACDF June 14, 2018. Status post recurrent paraspinal hematoma evacuation. EXAM: MRI CERVICAL SPINE WITHOUT CONTRAST TECHNIQUE: Multiplanar, multisequence MR imaging of the cervical spine was performed. No intravenous contrast was administered. COMPARISON:  MRI cervical spine July 01, 2018 FINDINGS: ALIGNMENT: Straightened cervical lordosis.  No  malalignment. Status post C3 through C5 corpectomy and ACDF. C2 through C5 facet screws. Nonsurgically altered disc morphologies are normal. No suspicious bone marrow signal. CORD:Cord compression C2-3 through C5 with cord edema similar to prior examination. No syrinx. POSTERIOR FOSSA, VERTEBRAL ARTERIES, PARASPINAL TISSUES: Heterogeneous T2, low T1 5.4 x 8 x 9.9 cm (transverse by AP by cc) paraspinal fluid collection within surgical bed, collection is larger than prior examination. New drain within the fluid collection with side ports within surgical bed C2-3 through C4-5. similar severe thecal sac effacement C2-3 through C5-6, AP dimension of the canal is 3-4 mm. DISC LEVELS: C2-3: No disc bulge, canal stenosis nor neural foraminal narrowing. C3-4: ACDF and posterior decompression. Fluid collection narrows the thecal sac to 3 mm. Limited assessment of neural foramen, likely moderate neural foraminal narrowing on the LEFT. C4-5: ACDF and posterior decompression. Fluid collection narrows the thecal sac to 3 mm. Limited assessment of neural foramen, likely moderate to severely narrowed on the LEFT. C5-6: ACDF and posterior decompression. Fluid collection narrows the thecal sac to 4 mm. Mild RIGHT neural foraminal narrowing. C6-7 and C7-T1: No disc bulge, canal stenosis nor neural foraminal narrowing. IMPRESSION: 1. Enlarging 5.4 x 8 x 9.9 cm paraspinal fluid collection along surgical approach and within surgical bed. Drainage catheter within fluid collection (potential pseudomeningocele). 2. Similar severe thecal sac effacement and cord compression C2-3 through C5 with cord edema, no syrinx. 3. Status post C3 through C5 ACDF, C4 corpectomy. C2 through C5 facet screws. Electronically Signed   By: Elon Alas M.D.   On: 07/10/2018 21:02   Ct Bone Marrow Biopsy & Aspiration  Result Date: 07/12/2018 INDICATION: Thrombocytopenia EXAM: CT BONE MARROW BIOPSY AND ASPIRATION MEDICATIONS: None. ANESTHESIA/SEDATION:  Fentanyl 25 mcg IV; Versed 1 mg IV Moderate Sedation Time:  12 minutes The patient was continuously monitored during the procedure by the interventional radiology nurse under my direct supervision. FLUOROSCOPY TIME:  Fluoroscopy Time:  minutes  seconds ( mGy). COMPLICATIONS: None immediate. PROCEDURE: Informed written consent was obtained from the patient after a thorough discussion of the procedural risks, benefits and alternatives. All questions were addressed. Maximal Sterile Barrier Technique was utilized including caps, mask, sterile gowns, sterile gloves, sterile drape, hand hygiene and skin antiseptic. A timeout was performed prior to the initiation of the procedure. Under CT guidance, a(n) 11 gauge guide needle was advanced into the right iliac bone via posterior approach. Aspirates and a core were obtained. Post biopsy images demonstrate no hemorrhage. Patient tolerated the procedure well without complication. Vital sign monitoring by nursing staff during the procedure will continue as patient is in the special procedures unit for post procedure observation. FINDINGS: The images document guide needle placement within the right iliac bone. Post biopsy images demonstrate no hemorrhage. IMPRESSION: Successful CT-guided bone marrow aspirate and core. Electronically Signed   By: Marybelle Killings M.D.   On: 07/12/2018 11:59   Vas [place] Lower Extremity Venous (dvt)  Result Date: 07/10/2018  Lower Venous Study Indications: Hx of dvt.  Performing Technologist: Abram Sander  Examination Guidelines: A complete evaluation includes B-mode imaging, spectral Doppler, color  Doppler, and power Doppler as needed of all accessible portions of each vessel. Bilateral testing is considered an integral part of a complete examination. Limited examinations for reoccurring indications may be performed as noted.  Right Venous Findings: +---------+---------------+---------+-----------+----------+-------+           CompressibilityPhasicitySpontaneityPropertiesSummary +---------+---------------+---------+-----------+----------+-------+ CFV      None           No       No                           +---------+---------------+---------+-----------+----------+-------+ SFJ      None                                                 +---------+---------------+---------+-----------+----------+-------+ FV Prox  None                                                 +---------+---------------+---------+-----------+----------+-------+ FV Mid   None                                                 +---------+---------------+---------+-----------+----------+-------+ FV DistalNone                                                 +---------+---------------+---------+-----------+----------+-------+ PFV      None                                                 +---------+---------------+---------+-----------+----------+-------+ POP      None           No       No                           +---------+---------------+---------+-----------+----------+-------+ PTV      None                                                 +---------+---------------+---------+-----------+----------+-------+ PERO     None                                                 +---------+---------------+---------+-----------+----------+-------+  Left Venous Findings: +---------+---------------+---------+-----------+----------+-------+          CompressibilityPhasicitySpontaneityPropertiesSummary +---------+---------------+---------+-----------+----------+-------+ CFV      None           No       No                           +---------+---------------+---------+-----------+----------+-------+  SFJ      None                                                 +---------+---------------+---------+-----------+----------+-------+ FV Prox  None                                                  +---------+---------------+---------+-----------+----------+-------+ FV Mid   None                                                 +---------+---------------+---------+-----------+----------+-------+ FV DistalNone                                                 +---------+---------------+---------+-----------+----------+-------+ PFV      None                                                 +---------+---------------+---------+-----------+----------+-------+ POP      None           No       No                           +---------+---------------+---------+-----------+----------+-------+ PTV      None                                                 +---------+---------------+---------+-----------+----------+-------+ PERO     None                                                 +---------+---------------+---------+-----------+----------+-------+    Summary: Right: Findings consistent with acute deep vein thrombosis involving thec No cystic structure found in the popliteal fossa. Left: Findings consistent with acute deep vein thrombosis involving the left common femoral vein, left proximal profunda vein, left femoral vein, left popliteal vein, left posterior tibial vein, and left peroneal vein. No cystic structure found in the popliteal fossa.  *See table(s) above for measurements and observations. Electronically signed by Deitra Mayo MD on 07/10/2018 at 5:22:43 PM.    Final         Scheduled Meds: . sodium chloride   Intravenous Once  . sodium chloride   Intravenous Once  . dexamethasone  8 mg Intravenous Q6H  . docusate sodium  100 mg Oral Daily  . doxycycline  100 mg Oral Q12H  . enalaprilat  1.25 mg Intravenous Once  . febuxostat  40 mg Oral Daily  . fentaNYL      . gabapentin  300 mg Oral TID  .  Gerhardt's butt cream   Topical BID  . Influenza vac split quadrivalent PF  0.5 mL Intramuscular Tomorrow-1000  . insulin aspart  0-5 Units Subcutaneous  QHS  . insulin aspart  0-9 Units Subcutaneous TID WC  . insulin glargine  15 Units Subcutaneous QHS  . lidocaine      . methocarbamol  750 mg Oral QID  . midazolam      . midodrine  5 mg Oral TID WC  . nystatin  5 mL Oral TID AC & HS  . senna-docusate  1 tablet Oral QHS  . sodium chloride flush  10-40 mL Intracatheter Q12H   Continuous Infusions: .  ceFAZolin (ANCEF) IV 2 g (07/12/18 1245)  . lactated ringers 10 mL/hr at 07/10/18 0900     LOS: 35 days     Bonnielee Haff, MD [facility] Pager [phone]  If 7PM-7AM, please contact night-coverage [email or web address] Password [email or web address] 07/12/2018, 2:16 PM

## 2018-07-12 NOTE — Progress Notes (Signed)
Patient ID: [name] [name], male   DOB: [birth year], 52 y.o.   MRN: [id number] Overall patient doing okay doesn't report much significant change although does have increased movement left arm left hand.  Exam consistent with increased grip strength about 3 out of 5 he can flicker his toes this morning which is better than yesterday. Incision remains clean dry and intact drain output appears to be minimal for the last 12 hours.  Patient's platelets are improved to 76 however INR is still abnormal at 1.29. Normal levels of PT and in this assay are anywhere from 11.4-15.2 his PT has been abnormal during his entire last week of hospitalizations with multiple labs have ranged anywhere from 15.9 to 19.8 on the 25th which was the last time he had a washout prior to the most recent one 2 nights agowhen his PT was 16.3 with an INR 1.33.Marland Kitchen The closest to normal INR was 1.22 on 1020 with a PT of 15.3 the closest it's ever been to normal.  I need to get his Hemovac out however I'm not taking it out with an abnormal PT. I will give him vitamin K this evening and transfuse another unit of platelets in order follow-up coags in the morning.

## 2018-07-12 NOTE — Progress Notes (Signed)
Pharmacy Antibiotic Note  Lot Hansell is a 52 y.o. male. Pharmacy has been consulted for Cefazolin dosing while neck drain in place. Also on oral Doxycycline. Renal function is stable and dose remains appropriate. Pt is afebrile and WBC is WNL. Drain remains in place.   Plan: Continue cefazolin 2 gm IV q8hrs F/u discontinuation of abx when drain removed  Height: 5\' 9"  (175.3 cm) Weight: 272 lb 14.9 oz (123.8 kg) IBW/kg (Calculated) : 70.7  Temp (24hrs), Avg:98.1 F (36.7 C), Min:97.7 F (36.5 C), Max:98.9 F (37.2 C)  Recent Labs  Lab 07/08/18 0500 07/09/18 0515 07/09/18 0834 07/10/18 0500 07/10/18 0747 07/11/18 0605 07/11/18 1350 07/11/18 1854 07/12/18 0500  WBC 6.9 6.9 6.5 5.4  --  5.2  --  5.5 5.8  CREATININE 0.85 0.90  --   --  1.05  --  0.93  --  0.75    Estimated Creatinine Clearance: 140.4 mL/min (by C-G formula based on SCr of 0.75 mg/dL).    Allergies  Allergen Reactions  . Nsaids Other (See Comments)    G6PD DEFICIENCY  . Sulfa Antibiotics Other (See Comments)    G6PD DEFICIENCY  . Protonix [Pantoprazole Sodium] Diarrhea    Severe diarrhea, Upset stomach    Antimicrobials this admission: Fluconazole 10/15 >> 10/22 Nystatin oral 10/15 >> Doxycycline PO 10/18>> Cefazolin 10/24>>  Dose adjustments this admission:  n/a  Microbiology results: 9/25 Urine - <10 K/ml, insignificant growth 9/25 Blood x 2  - negative 9/26 Blood x 2 - negative 9/27 right knee synovial fluid - negative 10/2 MRSA PCR - negative 10/2 surgical MRSA PCR: postive for Staph but negative for MRSA 10/2 MRSA PCR - negative  Lysle Pearl, PharmD, BCPS Please see AMION for all pharmacy numbers 07/12/2018 10:00 AM

## 2018-07-12 NOTE — Social Work (Signed)
CSW continuing to follow for support with disposition when medically appropriate.  Sayed Apostol, MSW, LCSWA [place] [facility] [phone]   

## 2018-07-12 NOTE — Progress Notes (Signed)
PT Cancellation Note  Patient Details Name: [name] [name] MRN: [id number] DOB: 08/28/1966   Cancelled Treatment:    Reason Eval/Treat Not Completed: Fatigue/lethargy limiting ability to participate.  Pt reports he is too sleepy post procedure (bone marrow bx).  He is agreeable for PT to check back tomorrow.  Thanks,    Rollene Rotunda. Alonnah Lampkins, PT, DPT  Acute Rehabilitation [phone] pager #[phone] office   07/12/2018, 4:07 PM

## 2018-07-13 LAB — PREPARE PLATELET PHERESIS: UNIT DIVISION: 0

## 2018-07-13 LAB — BPAM PLATELET PHERESIS
Blood Product Expiration Date: 201910312359
ISSUE DATE / TIME: 201910301703
UNIT TYPE AND RH: 6200

## 2018-07-13 LAB — CBC
HEMATOCRIT: 25.1 % — AB (ref 39.0–52.0)
Hemoglobin: 7.8 g/dL — ABNORMAL LOW (ref 13.0–17.0)
MCH: 31.1 pg (ref 26.0–34.0)
MCHC: 31.1 g/dL (ref 30.0–36.0)
MCV: 100 fL (ref 80.0–100.0)
NRBC: 0.5 % — AB (ref 0.0–0.2)
Platelets: 66 10*3/uL — ABNORMAL LOW (ref 150–400)
RBC: 2.51 MIL/uL — ABNORMAL LOW (ref 4.22–5.81)
RDW: 17.3 % — AB (ref 11.5–15.5)
WBC: 5.7 10*3/uL (ref 4.0–10.5)

## 2018-07-13 LAB — APTT: APTT: 24 s (ref 24–36)

## 2018-07-13 LAB — GLUCOSE, CAPILLARY
GLUCOSE-CAPILLARY: 330 mg/dL — AB (ref 70–99)
Glucose-Capillary: 281 mg/dL — ABNORMAL HIGH (ref 70–99)
Glucose-Capillary: 298 mg/dL — ABNORMAL HIGH (ref 70–99)
Glucose-Capillary: 268 mg/dL — ABNORMAL HIGH (ref 70–99)

## 2018-07-13 LAB — PROTIME-INR
INR: 1.31
Prothrombin Time: 16.1 seconds — ABNORMAL HIGH (ref 11.4–15.2)

## 2018-07-13 MED ORDER — SODIUM CHLORIDE 0.9% IV SOLUTION
Freq: Once | INTRAVENOUS | Status: AC
  Administered 2018-07-13: 13:00:00 via INTRAVENOUS

## 2018-07-13 MED ORDER — DEXAMETHASONE SODIUM PHOSPHATE 10 MG/ML IJ SOLN
INTRAMUSCULAR | Status: AC
  Administered 2018-07-13: 8 mg
  Filled 2018-07-13: qty 1

## 2018-07-13 MED ORDER — INSULIN GLARGINE 100 UNIT/ML ~~LOC~~ SOLN
25.0000 [IU] | Freq: Every day | SUBCUTANEOUS | Status: DC
  Administered 2018-07-13: 25 [IU] via SUBCUTANEOUS
  Filled 2018-07-13: qty 0.25

## 2018-07-13 NOTE — Progress Notes (Signed)
PROGRESS NOTE    [name] [name]  VOH:[id number] DOB: [birth year] DOA: 06/06/2018 PCP: Helane Rima, MD    Brief Narrative: Brief Narrative:  52 year old man with past medical history relevant for gout, hypertension anterior cervical corpectomy of C4 on 03/24/2018 with comp gated postoperative course including development of multiple PEs and DVTs started on anticoagulation and subsequently developed hematoma requiring reexploration on 03/28/2018 status post placement of IVC filter who was discharged to skilled nursing facility and had a complicated course including progressive lower extremity weakness, intermittent fevers of unclear etiology and hospitalizations for "pneumonia". Patient was admitted to this hospital is a transfer from [facility] with MRI of C-spine that showed postoperative changes and cord compression with cord edema and concern for ischemia. Patient is status post posterior cervical decompression laminectomy of C3, C4, C5, removal of spinous processes, foraminotomies on 06/14/2018. On 06/22/2018 patient began to develop worsening left sided paresthesias and weakness and a stat MRI showed evidence of postoperative hematoma. Patient was taken to the OR with evacuation of hematoma. Additionally his postoperative course has been complicated by recurrent fevers,bilateral lower extremity DVTs on ultrasound,migratory arthralgias/arthritis that are thought to be rheumatologic in nature as well as significant orthostasis limiting the amount he can work with physical therapy. His arthralgia has resolved.   Noted to have decreased left hand grip on 10/19, MRI showed large epidural hematoma, patient s/p clot evacuation.  He was place back on anticoagulation argatroban on 10-22. He was notice to be more bradycardic with HR in the 40 on 10-23. Cardiology was consulted, and argatroban was transition to bivalirrubin to avoid bradycardia. His HR improved after argatroban was discontinue.  -HIT panel 0.486 (mildly elevated)- SRA (serotonin release assay) negative. Dr Marin Olp has been helping with patient care. He is recommending to avoid heparin or Lovenox products.   Patient develops weakness of left arm on 10-25. He was take emergently to OR for recurrence of epidural hematoma on 10-25. Anticoagulation was discontinue since 10-25. Dr Saintclair Halsted is recommending for patient to remain off anticoagulation for alt leas 1 week, and if anticoagulation is consider, that will need to be discuss with DR Saintclair Halsted.  Patient the night of 10-28 develops left arm weakness, numbness. Stat MRI was ordered, which confirm recurrent hematoma. Patient was taken to OR for hematoma evacuation 10-28. He has been off anticoagulation since 10-25. Patient will received platelet transfusion 10-29.  For his thrombocytopenia, a Bone marrow biopsy is planned for 10-30. For LE DVT, he has IVC filter. He cant not be on anticoagulation due to recurrent bleeding.     Assessment & Plan:   Principal Problem:   Fever Active Problems:   Myelopathy (HCC)   Benign essential HTN   Cord compression (HCC)   History of pulmonary embolism   Spondylosis, cervical, with myelopathy   Labile blood pressure   Orthostatic hypotension   Leukocytosis   Tachycardia-bradycardia syndrome (HCC)   Near syncope due to orthostatic hypotension Thought to be related to hypovolemia and autonomic dysfunction.  On midodrine and TED hose.  Echocardiogram July 2019 showed a normal systolic function, grade 2 diastolic dysfunction.  No significant valve abnormalities are present Evaluated by cardiology. Continue with midodrine. Abdominal binder.  Stable.  No acute issue at this time.  Transient bradycardia Noticed to have lower HR on 10-23. Asymptomatic.  Cardiology consulted, for both bradycardia, and syncope and orthostatic hypotension.  No indication for pacer. Thought argatroban might be causing bradycardia.  Heart rate is stable  now.  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  -IV antibiotics per sx. Needs to be on IV antibiotics a tleast until drain is in place.  -Patient has required multiples surgical evacuation of epidural hematoma. Difficult situation with patient recent thrombotic event. -He developed left arm weakness 10-25- and 10-28, he was take to OR for epidural hematoma evacuation.  Neurosurgery continues to follow.  Defer management to them.  Thrombocytopenia/coagulopathy -Cardiolipin antibody negative -Lupus anticoagulant, homocysteine level, factor VIII within normal limits -Protein S activity 60 (low) -Protein C total >200; Activity 199 (high)  -HIT panel 0.486 (mildly elevated)- SRA (serotonin release assay) negative -immature platelet fraction WNL -Was given 2u platelets on 10/19 in the OR. And 2 units 10-25 for OR. Platelet transfusion 10/29 Patient transfused platelets and given vitamin K on 10/30. Patient underwent bone marrow biopsy on 10/30.  Await results.  Hematology oncology continues to follow.  Platelet counts remain low but stable.  Polyarthralgia/persistent fever -At the time of admission patient had symptoms in the shoulders and ankles, along with a history of gout and pain in his knees.  He underwent arthrocentesis however no evidence of infection was found. -He was noted to have elevated inflammatory markers including CCP, CRP.  However ANCA, aldolase, ANA were negative. -Previous rounding physicians have discussed with Dr. Kathlene November, rheumatologist, he will follow-up with the patient as an  outpatient. -Patient was placed on prednisone however currently on dexamethasone (neurosurgery) Fever has resolved.  Noted to be on doxycycline since 10/18.  Reason is not entirely clear.  Discontinued.  Recent PE /DVT in setting of spine surgery -Status post IVC filter placement on March 28, 2018 -Patient was on apixaban and changed over to IV heparin.  Anticoagulation however was discontinued for surgery.  Patient also noted to have thrombocytopenia. -Vascular DVT US 06/26/2018 showed bilateral lower extremity DVTs likely acute -Case was discussed with  vascular surgery, Dr. Trula Slade, patient is out of the 3 week window for treatment, he would have to get TPA, which is contraindicated in a bleeding patient.  -Bivalirudin discontinue by neurosurgery. Serotonin release assay negative.  - Patient has required multiple surgical evacuation of epidural hematoma. Difficult situation with patient recent thrombotic event.  Discussed with Dr Shick 10-28, thrombectomy only will not be enough to take care of B/L thrombus, patient will require anticoagulation, TPA. Patient is too high risk for bleeding, with recurrent epidural hematoma. Patient can not received anticoagulation at this time. Also Dr Saintclair Halsted will need to clear patient to be on anticoagulation.  This remains a very challenging scenario.  Patient frustrated by this.  Normocytic anemia Hemoglobin 7.8 today.  Low but stable.  Transfuse if it drops below 7.  Gout Stable.  Continue Uloric  Mild acute renal failure Resolved.   Essential hypertension  Amlodipine discontinue due to orthostatic hypotension and edema.  Blood pressure remains stable.  Some degree of hypertension likely due to Midrin.  Hyperglycemia; from steroids.  CBGs remain poorly controlled.  Dose of Lantus was increased last night to 20 units.  May have to increase further.  DVT prophylaxis: TED's.  No anticoagulants due to frequent epidural bleeding Code Status: full code.  Family Communication: discussed with patient,  Disposition Plan: Patient remains tenuous from a medical standpoint due to recurrent episodes of epidural hematoma.    Consultants:   Neurosurgery  Hematology  Cardiology    Procedures: On 10/2 1. posterior cervical decompressive laminectomy at C3, C4, C5 with removal of the spinous process and complete laminectomy and foraminotomies of the C3, C4, C5 nerve roots. 2. Posterior cervical fusion with lateral mass screws at C3-C4-C5 and pars screws at C2 lies in the globus ellipseLateral mass screw system 3. Posterior lateral arthrodesis C2-C5 utilizing locally harvested autograft mixed with vivigen  06/22/2018: Procedure reexploration posterior cervical wound for evacuation of epidural hematoma  06/26/2018 DVT ultrasound:Right: Findings consistent with acute deep vein thrombosis involving the right common femoral vein, right femoral vein, right popliteal vein, right posterior tibial vein, and right peroneal vein. Thrombosis extends proximally into external iliac vein.  Proximal iliac vein not visualized. Left: Findings consistent with acute deep vein thrombosis involving the left common femoral vein, left femoral vein, left popliteal vein, left posterior tibial vein, and left peroneal vein. Findings consistent with acute superficial vein thrombosis  involving the left great saphenous vein. Thrombosis extends proximally into external iliac vein. Proximal iliac vein not visualized.  07/01/18 Clot evacuation   RUE doppler 07/02/18    Antimicrobials:  Ancef.   Subjective: Patient remains very frustrated.  Continues to have pain in his neck.  No chest pain or shortness of breath.  Objective: Vitals:   07/12/18 2300 07/13/18 0300 07/13/18 0731 07/13/18 1139  BP: (!) 123/91 (!) 125/94 (!) 117/92 (!) 137/95  Pulse:   89 95  Resp:   16 19  Temp: 98.1 F (36.7 C) 98.1 F (36.7 C) (!) 97.5 F (36.4 C) 97.9 F (36.6 C)   TempSrc: Oral Oral Oral Oral  SpO2:  100% 100% 100%  Weight:      Height:        Intake/Output Summary (Last 24 hours) at 07/13/2018 1222 Last data filed at 07/12/2018 1849 Gross per 24 hour  Intake 240 ml  Output 650 ml  Net -410 ml   Filed Weights   06/14/18 1151 06/22/18 1641 07/07/18 1000  Weight: 123.8 kg 123.8 kg 123.8 kg    Examination:  General exam: No distress. Respiratory system: Normal effort at rest.  Clear to auscultation bilaterally. Cardiovascular system: S1-S2 is normal regular.  No S3-S4.  No rubs murmurs or bruit. Gastrointestinal system: Abdomen remains soft.  Nontender nondistended.    Data Reviewed: I have personally reviewed following labs and imaging studies  CBC: Recent Labs  Lab 07/09/18 0834 07/10/18 0500 07/11/18 0605 07/11/18 1854 07/12/18 0500 07/13/18 0600  WBC 6.5 5.4 5.2 5.5 5.8 5.7  NEUTROABS 5.7  --  4.7  --   --   --   HGB 9.6* 9.3* 8.0* 8.1* 7.4* 7.8*  HCT 31.7* 31.0* 25.8* 25.9* 24.0* 25.1*  MCV 99.1 98.4 98.5 98.9 98.8 100.0  PLT 60* 66* 62* 87* 76* 66*   Basic Metabolic Panel: Recent Labs  Lab 07/08/18 0500 07/09/18 0515 07/10/18 0747 07/11/18 1350 07/12/18 0500  NA 140 138 139 139 140  K 4.6 4.7 4.4 4.6 4.1  CL 105 106 106 105 107  CO2 _0 GLUCOSE 292* 315* 296* 378* 298*  BUN 38* 41* 43* 38* 36*  CREATININE 0.85 0.90 1.05 0.93 0.75  CALCIUM 9.2 9.1 9.0 8.8* 8.7*   GFR: Estimated Creatinine Clearance: 140.4 mL/min (by C-G formula based on  SCr of 0.75 mg/dL).  Coagulation Profile: Recent Labs  Lab 07/07/18 0935 07/09/18 0834 07/12/18 0500 07/13/18 0630  INR 1.70 1.33 1.29 1.31   CBG: Recent Labs  Lab 07/12/18 1146 07/12/18 1708 07/12/18 2218 07/13/18 0736 07/13/18 1141  GLUCAP 257* 229* 307* 281* 298*      Radiology Studies: Ct Bone Marrow Biopsy & Aspiration  Result Date: 07/12/2018 INDICATION: Thrombocytopenia EXAM: CT BONE MARROW BIOPSY AND ASPIRATION MEDICATIONS: None.  ANESTHESIA/SEDATION: Fentanyl 25 mcg IV; Versed 1 mg IV Moderate Sedation Time:  12 minutes The patient was continuously monitored during the procedure by the interventional radiology nurse under my direct supervision. FLUOROSCOPY TIME:  Fluoroscopy Time:  minutes  seconds ( mGy). COMPLICATIONS: None immediate. PROCEDURE: Informed written consent was obtained from the patient after a thorough discussion of the procedural risks, benefits and alternatives. All questions were addressed. Maximal Sterile Barrier Technique was utilized including caps, mask, sterile gowns, sterile gloves, sterile drape, hand hygiene and skin antiseptic. A timeout was performed prior to the initiation of the procedure. Under CT guidance, a(n) 11 gauge guide needle was advanced into the right iliac bone via posterior approach. Aspirates and a core were obtained. Post biopsy images demonstrate no hemorrhage. Patient tolerated the procedure well without complication. Vital sign monitoring by nursing staff during the procedure will continue as patient is in the special procedures unit for post procedure observation. FINDINGS: The images document guide needle placement within the right iliac bone. Post biopsy images demonstrate no hemorrhage. IMPRESSION: Successful CT-guided bone marrow aspirate and core. Electronically Signed   By: Marybelle Killings M.D.   On: 07/12/2018 11:59        Scheduled Meds: . sodium chloride   Intravenous Once  . sodium chloride   Intravenous Once  . sodium chloride   Intravenous Once  . sodium chloride   Intravenous Once  . dexamethasone  8 mg Intravenous Q6H  . docusate sodium  100 mg Oral Daily  . enalaprilat  1.25 mg Intravenous Once  . febuxostat  40 mg Oral Daily  . gabapentin  300 mg Oral TID  . Gerhardt's butt cream   Topical BID  . Influenza vac split quadrivalent PF  0.5 mL Intramuscular Tomorrow-1000  . insulin aspart  0-5 Units Subcutaneous QHS  . insulin aspart  0-9 Units Subcutaneous TID WC   . insulin glargine  20 Units Subcutaneous QHS  . methocarbamol  750 mg Oral QID  . midodrine  5 mg Oral TID WC  . nystatin  5 mL Oral TID AC & HS  . senna-docusate  1 tablet Oral QHS  . sodium chloride flush  10-40 mL Intracatheter Q12H   Continuous Infusions: .  ceFAZolin (ANCEF) IV 2 g (07/13/18 0556)  . lactated ringers 10 mL/hr at 07/10/18 0900     LOS: 36 days     Bonnielee Haff, MD [facility] Pager [phone]  If 7PM-7AM, please contact night-coverage [email or web address] Password [email or web address] 07/13/2018, 12:22 PM

## 2018-07-13 NOTE — Progress Notes (Signed)
So far, everything seems to be relatively stable for [name] [name].  He still has the drainage tube into the neck.  As far as I can tell, there is been no obvious bleeding.  Baby had his bone marrow test done yesterday.  Hopefully, there might be a preliminary result tomorrow.  As far as treating the thrombocytopenia, this will be dictated by his bone marrow result.  His CBC shows a white cell count 5.7.  Hemoglobin 7.8.  Platelet count 66,000.  From my perspective, he has a normal PT and PTT.  The platelet function assay came back normal.  I did note that his fibrinogen was on the low side.  I am not sure what this signifies.  The fibrinogen level was about 100.  I am not sure as to why this would be low.  As such, we might want to consider giving him cryoprecipitate as a "thickener" if neurosurgery feels his coagulation parameters need to be improved.  He really wants to have his blood clots in his leg treated.  I told him that there was really no way that we could treat this as long as he is not a candidate for anticoagulation.  I spoke with 1 of our vascular surgeons.  He said that they would not do any type of thrombectomy unless he could be anticoagulated.  Thankfully, there is no obvious cough or shortness of breath.  His appetite is okay.  His blood sugars are still quite high.  I cannot imagine however that this would be a factor in his bleeding.  He has had no fever.  We will have to await the results of his bone marrow report to see what else we might be able to do to help this unclear coagulopathy that he has.  I always enjoy talking to him.  He is trying to keep his attitude good.  He has been through so much.  I think that he is getting fantastic care from all the staff up on [facility].  Again this is an incredibly complicated case.  There is no obvious single defining issue that can "tie everything" together.  We will continue to follow along closely.  [name], MD  [place]  15:3

## 2018-07-13 NOTE — Progress Notes (Signed)
Patient ID: [name] [name], male   DOB: 1966-07-19, 52 y.o.   MRN: [id number] Patient overall doing about the same this morning pain is well controlled tolerated the biopsy well yesterday   stable neurologic exam with to 3/5 grip strength on the left 3/5 biceps strength flicker of movement in his left toes.  4- out of 5 right upper and lower extremity   ordered a stat INR this morning to evaluate his coagulopathy and see if it is improved from yesterday with vitamin K and the platelets.  Platelet count actually went down to 66 have low concerned about that  Because I think we transfused him a unit last night. I want his platelet count and his INR to be closer to normal prior to removal of his Hemovac.

## 2018-07-13 NOTE — Progress Notes (Signed)
Results for HASSEN, BRUUN (MRN [id number]) as of 07/13/2018 11:16  Ref. Range 07/12/2018 07:38 07/12/2018 11:46 07/12/2018 17:08 07/12/2018 22:18 07/13/2018 07:36  Glucose-Capillary Latest Ref Range: 70 - 99 mg/dL 409 (H) 811 (H) 914 (H) 307 (H) 281 (H)  Noted that blood sugars continue to be greater than 180 mg/dl and on steroids.  Recommend increasing Lantus to 24 units daily, increasing Novolog correction scale to MODERATE (0-15 units) TID + HS scale. Recommend changing diet to CHO modified diet. Will continue to monitor blood sugars while in the hospital.  Smith Mince RN BSN CDE Diabetes Coordinator Pager: [phone]  8am-5pm

## 2018-07-13 NOTE — Progress Notes (Signed)
Pyxis has been worked on for the last hour and a half. No meds able to be pulled.   Iv insulin and decadron given.

## 2018-07-13 NOTE — Progress Notes (Signed)
Physical Therapy Treatment Patient Details Name: [name] [name] MRN: [id number] DOB: 1966-05-09 Today's Date: 07/13/2018    History of Present Illness 52 y.o. male admitted on 06/06/18 for bil LE wekness and muscle spasm after multiple falls (with resultant R knee meniscus tear placed in bledsoe brace) following a recent ACDF at C3 in July 2019 (complicated by PE (s/p IVC filter), CIR, and then SNF placement before finally returning home with his parents).  In the ED MRI of c-spine which showed post operative changes and cord edema and possibility of ischemia.  His R knee was aspirated on 06/09/18.  S/p posterior cervical decompression and fusion on 06/14/18. Entire acute course complicated by hypotension in standing (thought to be autonomic) and by finding of multiple LE DVTs (already has an IVC filter in place, but per MDs notes at risk for blocking the filter with these clots).  Pt with post op issues with incisional bleeding s/p 2 evacuations of hematomas and placement of hemovac (06/22/18 and 07/01/18) as well as multiple units of plasma.  Cardiology consulted 07/05/18 due to bradycardia thought to be from his newly started blood thinner.  PMHx of HTN, gout, anemia, DDD (lumbar).  Pt with another re-accumulation of epidural hematoma with increased L sided weakness and cord compression with emergent evacuation surgery 07/10/18 late in the PM.  Pt also needing more platelets post op.  Bone marrow bx scheduled for 07/12/18.    PT Comments    PT re-evaluated pt as this is the first time I have mobilized him since his most recent posterior evacuation surgery on 10/28.  I am unable to elicit any movement out of his left LE and only trace movement in his left arm. Sensation remains the same.  He presents as an incomplete quad with trunk weakness, inability to sit un assisted.  He was able to tolerate sitting EOB, but needs max to total assist do do so.  We continue to lift using the maxi move for OOB/upright  positioning time.  PT will continue to follow acutely for safe mobility progression  Follow Up Recommendations  [facility]     Equipment Recommendations  Wheelchair (measurements PT);Wheelchair cushion (measurements PT);Other (comment);Hospital bed(hoyer lift, 20x20 tilt in space WC)    Recommendations for Other Services   NA     Precautions / Restrictions Precautions Precautions: Fall;Cervical Precaution Comments: hemovac Required Braces or Orthoses: Other Brace/Splint;Cervical Brace Cervical Brace: Hard collar;At all times Other Brace/Splint: Use locked bledsoe when and if he can start attempting to stand again.      Mobility  Bed Mobility Overal bed mobility: Needs Assistance Bed Mobility: Rolling Rolling: +2 for physical assistance;Total assist Sidelying to sit: +2 for physical assistance;Total assist     Sit to sidelying: +2 for physical assistance;Total assist General bed mobility comments: Two person total assist to roll bil, come up from side lying to sitting EOB and assist in returning to supine.  Pt is unable to help at all significantly.   Transfers Overall transfer level: Needs assistance               General transfer comment: Maxi move used to assist pt OOB to the chair for upright positioning.   Ambulation/Gait             General Gait Details: unable at this time.        Balance Overall balance assessment: Needs assistance Sitting-balance support: Feet supported;Bilateral upper extremity supported Sitting balance-Leahy Scale: Zero Sitting balance - Comments: Max assist EOB  due to poor trunk control and difficulty using either extremity to prop in sitting. Sat EOB for >10 mins working on upright head and trunk, scapular retractions and getting his UEs manually positioned to help support him in sitting EOB.   Postural control: Other (comment)(varies)                                  Cognition Arousal/Alertness:  Awake/alert Behavior During Therapy: Flat affect Overall Cognitive Status: Within Functional Limits for tasks assessed                                           General Comments General comments (skin integrity, edema, etc.): Pt's strength re-assessed.  I was unable to elicit any muscle activity in pt's left leg, trace finger flexion, wrist flexion, no elbow flexion and no shoulder elevation.  He could shrug both shoulders.  R leg is getting weaker, but I think this is more from immobility, ankle 2+/5, knee 2/5, hip 2/5.  Sensation is intact in all 4 extremities, but not normal.        Pertinent Vitals/Pain Pain Assessment: Faces Faces Pain Scale: Hurts even more Pain Location: posterior neck Pain Descriptors / Indicators: Guarding;Grimacing Pain Intervention(s): Limited activity within patient's tolerance;Monitored during session;Repositioned           PT Goals (current goals can now be found in the care plan section) Acute Rehab PT Goals Patient Stated Goal: to have answers, know a plan, get better PT Goal Formulation: With patient Time For Goal Achievement: 07/27/18 Potential to Achieve Goals: Fair Progress towards PT goals: Not progressing toward goals - comment(PT re-eval post op)    Frequency    Min 2X/week      PT Plan Current plan remains appropriate       AM-PAC PT "6 Clicks" Daily Activity  Outcome Measure  Difficulty turning over in bed (including adjusting bedclothes, sheets and blankets)?: Unable Difficulty moving from lying on back to sitting on the side of the bed? : Unable Difficulty sitting down on and standing up from a chair with arms (e.g., wheelchair, bedside commode, etc,.)?: Unable Help needed moving to and from a bed to chair (including a wheelchair)?: Total Help needed walking in hospital room?: Total Help needed climbing 3-5 steps with a railing? : Total 6 Click Score: 6    End of Session Equipment Utilized During  Treatment: Cervical collar Activity Tolerance: Patient limited by pain Patient left: in chair;with call bell/phone within reach;with family/visitor present Nurse Communication: Mobility status PT Visit Diagnosis: Other abnormalities of gait and mobility (R26.89);Other symptoms and signs involving the nervous system (R29.898);Muscle weakness (generalized) (M62.81)(quadriplegia)     Time: 9604-5409 PT Time Calculation (min) (ACUTE ONLY): 44 min  Charges: 1 re-eval  $Therapeutic Activity: 8-22 mins $Neuromuscular Re-education: 8-22 mins                    Joene Gelder B. Deshawna Mcneece, PT, DPT  Acute Rehabilitation [phone] pager #[phone] office   07/13/2018, 5:25 PM

## 2018-07-14 LAB — CBC WITH DIFFERENTIAL/PLATELET
Abs Immature Granulocytes: 0.12 10*3/uL — ABNORMAL HIGH (ref 0.00–0.07)
BASOS ABS: 0 10*3/uL (ref 0.0–0.1)
BASOS PCT: 0 %
EOS PCT: 0 %
Eosinophils Absolute: 0 10*3/uL (ref 0.0–0.5)
HCT: 25.2 % — ABNORMAL LOW (ref 39.0–52.0)
HEMOGLOBIN: 8.2 g/dL — AB (ref 13.0–17.0)
Immature Granulocytes: 2 %
LYMPHS PCT: 6 %
Lymphs Abs: 0.3 10*3/uL — ABNORMAL LOW (ref 0.7–4.0)
MCH: 30.8 pg (ref 26.0–34.0)
MCHC: 32.5 g/dL (ref 30.0–36.0)
MCV: 94.7 fL (ref 80.0–100.0)
Monocytes Absolute: 0.3 10*3/uL (ref 0.1–1.0)
Monocytes Relative: 5 %
Neutro Abs: 5.2 10*3/uL (ref 1.7–7.7)
Neutrophils Relative %: 87 %
PLATELETS: 53 10*3/uL — AB (ref 150–400)
RBC: 2.66 MIL/uL — AB (ref 4.22–5.81)
RDW: 19.6 % — ABNORMAL HIGH (ref 11.5–15.5)
WBC: 5.9 10*3/uL (ref 4.0–10.5)
nRBC: 0.8 % — ABNORMAL HIGH (ref 0.0–0.2)

## 2018-07-14 LAB — BASIC METABOLIC PANEL
ANION GAP: 2 — AB (ref 5–15)
BUN: 28 mg/dL — AB (ref 6–20)
CO2: 28 mmol/L (ref 22–32)
Calcium: 8.2 mg/dL — ABNORMAL LOW (ref 8.9–10.3)
Chloride: 111 mmol/L (ref 98–111)
Creatinine, Ser: 0.62 mg/dL (ref 0.61–1.24)
GFR calc Af Amer: >60 mL/min (ref >60)
GFR calc non Af Amer: >60 mL/min (ref >60)
GLUCOSE: 279 mg/dL — AB (ref 70–99)
POTASSIUM: 3.9 mmol/L (ref 3.5–5.1)
Sodium: 141 mmol/L (ref 135–145)

## 2018-07-14 LAB — BPAM FFP
BLOOD PRODUCT EXPIRATION DATE: 201910312359
Blood Product Expiration Date: 201911042359
ISSUE DATE / TIME: 201910311323
ISSUE DATE / TIME: 201910311323
UNIT TYPE AND RH: 5100
Unit Type and Rh: 2800

## 2018-07-14 LAB — BPAM PLATELET PHERESIS
Blood Product Expiration Date: 201911012359
ISSUE DATE / TIME: 201910311746
Unit Type and Rh: 6200

## 2018-07-14 LAB — PREPARE FRESH FROZEN PLASMA

## 2018-07-14 LAB — GLUCOSE, CAPILLARY
GLUCOSE-CAPILLARY: 260 mg/dL — AB (ref 70–99)
GLUCOSE-CAPILLARY: 287 mg/dL — AB (ref 70–99)
GLUCOSE-CAPILLARY: 212 mg/dL — AB (ref 70–99)
Glucose-Capillary: 256 mg/dL — ABNORMAL HIGH (ref 70–99)

## 2018-07-14 LAB — ADAMTS13 ACTIVITY: ADAMTS 13 ACTIVITY: 59.2 % — AB (ref >66.8)

## 2018-07-14 LAB — PROTIME-INR
INR: 1.36
Prothrombin Time: 16.6 seconds — ABNORMAL HIGH (ref 11.4–15.2)

## 2018-07-14 LAB — PREPARE PLATELET PHERESIS: UNIT DIVISION: 0

## 2018-07-14 LAB — HEMOGLOBIN AND HEMATOCRIT, BLOOD
HEMATOCRIT: 22.3 % — AB (ref 39.0–52.0)
HEMOGLOBIN: 6.9 g/dL — AB (ref 13.0–17.0)

## 2018-07-14 LAB — APTT: APTT: 24 s (ref 24–36)

## 2018-07-14 LAB — ADAMTS13 ACTIVITY REFLEX

## 2018-07-14 LAB — PREPARE RBC (CROSSMATCH)

## 2018-07-14 MED ORDER — SODIUM CHLORIDE 0.9% IV SOLUTION
Freq: Once | INTRAVENOUS | Status: AC
  Administered 2018-07-14: 09:00:00 via INTRAVENOUS

## 2018-07-14 MED ORDER — INSULIN GLARGINE 100 UNIT/ML ~~LOC~~ SOLN
30.0000 [IU] | Freq: Every day | SUBCUTANEOUS | Status: DC
  Administered 2018-07-14 – 2018-07-19 (×6): 30 [IU] via SUBCUTANEOUS
  Filled 2018-07-14 (×7): qty 0.3

## 2018-07-14 MED ORDER — SODIUM CHLORIDE 0.9% IV SOLUTION: Freq: Once | INTRAVENOUS | Status: AC

## 2018-07-14 NOTE — Anesthesia Postprocedure Evaluation (Signed)
Anesthesia Post Note  Patient: [name] [name]  Procedure(s) Performed: Evacuation of Hematoma POSTERIOR CERVICAL (N/A Spine Cervical)     Patient location during evaluation: PACU Anesthesia Type: General Level of consciousness: awake and alert Pain management: pain level controlled Vital Signs Assessment: post-procedure vital signs reviewed and stable Respiratory status: spontaneous breathing, nonlabored ventilation, respiratory function stable and patient connected to nasal cannula oxygen Cardiovascular status: blood pressure returned to baseline and stable Postop Assessment: no apparent nausea or vomiting Anesthetic complications: no    Last Vitals:  Vitals:   07/14/18 1138 07/14/18 1607  BP: 138/82 (!) 143/84  Pulse: (!) 48 (!) 42  Resp: 16 15  Temp: (!) 36.4 C 36.5 C  SpO2: 100% 100%    Last Pain:  Vitals:   07/14/18 1743  TempSrc:   PainSc: 8                  Cecile Hearing

## 2018-07-14 NOTE — Progress Notes (Signed)
Inpatient Diabetes Program Recommendations  AACE/ADA: New Consensus Statement on Inpatient Glycemic Control (2015)  Target Ranges:  Prepandial:   less than 140 mg/dL      Peak postprandial:   less than 180 mg/dL (1-2 hours)      Critically ill patients:  140 - 180 mg/dL   Lab Results  Component Value Date   GLUCAP 260 (H) 07/14/2018    Review of Glycemic ControlResults for CATALDO, COSGRIFF (MRN [id number]) as of 07/14/2018 12:09  Ref. Range 07/13/2018 11:41 07/13/2018 17:00 07/13/2018 21:47 07/14/2018 07:40  Glucose-Capillary Latest Ref Range: 70 - 99 mg/dL 409 (H) 811 (H) 914 (H) 260 (H)   Diabetes history:None Outpatient Diabetes medications:None Current orders for Inpatient glycemic control: Novolog sensitive tid with meals and HS, Lantus 25 units QHS Decadron 8 mg Q6H Inpatient Diabetes Program Recommendations:   Please consider adding Novolog 5 units tid with meals (hold if patient eats less than 50%).  Also consider increasing Novolog correction to moderate every 4 hours to help with high blood sugars while on steroids.    Thanks  Beryl Meager, RN, BC-ADM Inpatient Diabetes Coordinator Pager [phone] (8a-5p)

## 2018-07-14 NOTE — Progress Notes (Signed)
Patient ID: [name] [name], male   DOB: 1966-06-15, 52 y.o.   MRN: [id number] Patient overall says is a little better today  Neurologic exam unchanged 0-1 movement left lower extremity to L5 left upper extremities right upper and lower extremity 3-4 out of 5  Drain with no output however awaiting improvement and platelet count and coags to remove the drain transfused both platelets and FFP last night awaiting INR and platelet count this morning

## 2018-07-14 NOTE — Progress Notes (Signed)
PROGRESS NOTE    [name] [name]  AOZ:[id number] DOB: Dec 16, 1965 DOA: 06/06/2018 PCP: Helane Rima, MD    Brief Narrative: Brief Narrative:  52 year old man with past medical history relevant for gout, hypertension anterior cervical corpectomy of C4 on 03/24/2018 with comp gated postoperative course including development of multiple PEs and DVTs started on anticoagulation and subsequently developed hematoma requiring reexploration on 03/28/2018 status post placement of IVC filter who was discharged to skilled nursing facility and had a complicated course including progressive lower extremity weakness, intermittent fevers of unclear etiology and hospitalizations for "pneumonia". Patient was admitted to this hospital is a transfer from [facility] with MRI of C-spine that showed postoperative changes and cord compression with cord edema and concern for ischemia. Patient is status post posterior cervical decompression laminectomy of C3, C4, C5, removal of spinous processes, foraminotomies on 06/14/2018. On 06/22/2018 patient began to develop worsening left sided paresthesias and weakness and a stat MRI showed evidence of postoperative hematoma. Patient was taken to the OR with evacuation of hematoma. Additionally his postoperative course has been complicated by recurrent fevers,bilateral lower extremity DVTs on ultrasound,migratory arthralgias/arthritis that are thought to be rheumatologic in nature as well as significant orthostasis limiting the amount he can work with physical therapy. His arthralgia has resolved.   Noted to have decreased left hand grip on 10/19, MRI showed large epidural hematoma, patient s/p clot evacuation.  He was place back on anticoagulation argatroban on 10-22. He was notice to be more bradycardic with HR in the 40 on 10-23. Cardiology was consulted, and argatroban was transition to bivalirrubin to avoid bradycardia. His HR improved after argatroban was discontinue.  -HIT panel 0.486 (mildly elevated)- SRA (serotonin release assay) negative. Dr Marin Olp has been helping with patient care. He is recommending to avoid heparin or Lovenox products.   Patient developed weakness of left arm on 10-25. He was take emergently to OR for recurrence of epidural hematoma on 10-25. Anticoagulation was discontinue since 10-25. Dr Saintclair Halsted is recommending for patient to remain off anticoagulation for alt leas 1 week, and if anticoagulation is consider, that will need to be discuss with DR Saintclair Halsted.  Patient the night of 10-28 develops left arm weakness, numbness. Stat MRI was ordered, which confirm recurrent hematoma. Patient was taken to OR for hematoma evacuation 10-28. He has been off anticoagulation since 10-25. Patient will received platelet transfusion 10-29.  For his thrombocytopenia, a Bone marrow biopsy is planned for 10-30. For LE DVT, he has IVC filter. He cant not be on anticoagulation due to recurrent bleeding.     Assessment & Plan:   Principal Problem:   Fever Active Problems:   Myelopathy (HCC)   Benign essential HTN   Cord compression (HCC)   History of pulmonary embolism   Spondylosis, cervical, with myelopathy   Labile blood pressure   Orthostatic hypotension   Leukocytosis   Tachycardia-bradycardia syndrome (HCC)   Near syncope due to orthostatic hypotension Thought to be related to hypovolemia and autonomic dysfunction.  On midodrine and TED hose.  Echocardiogram July 2019 showed a normal systolic function, grade 2 diastolic dysfunction.  No significant valve abnormalities noted. Evaluated by cardiology. Continue with midodrine. Abdominal binder.  Stable.  No acute issue at this time.  Transient bradycardia Noticed to have lower HR on 10-23. Asymptomatic.  Cardiology consulted, for both bradycardia, and syncope and orthostatic hypotension.  No indication for pacer. Thought argatroban might be causing bradycardia.  Heart rate has been stable.   Again noted to  be mildly bradycardic this morning.  He is asymptomatic.  Continue to monitor.  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  -IV antibiotics per sx. Needs to be on IV antibiotics a tleast until drain is in place.  -Patient has required multiples surgical evacuation of epidural hematoma. Difficult situation with patient recent thrombotic event. -He developed left arm weakness 10-25- and 10-28, he was take to OR for epidural hematoma evacuation.  Neurosurgery continues to follow.  Defer management to them.  Thrombocytopenia/coagulopathy -Cardiolipin antibody negative -Lupus anticoagulant, homocysteine level, factor VIII within normal limits -Protein S activity 60 (low) -Protein C total >200; Activity 199 (high)  -HIT panel 0.486 (mildly elevated)- SRA (serotonin release assay) negative -immature platelet fraction WNL -Was given 2u platelets on 10/19 in the OR. And 2 units 10-25 for OR. Platelet transfusion 10/29 Patient transfused platelets and given vitamin K on 10/30. Patient underwent bone marrow biopsy on 10/30.  Pathology report suggests nonspecific findings.  Await further hematology input.  Monitor platelet counts.  Patient also given cryoprecipitates.  Recheck labs tomorrow.  Polyarthralgia/persistent fever -At the time of admission patient had symptoms in the shoulders and ankles, along with a history of gout and pain in his knees.  He underwent arthrocentesis however no evidence of infection was found. -He was noted to have elevated inflammatory markers including CCP, CRP.   However ANCA, aldolase, ANA were negative. -Previous rounding physicians have discussed with Dr. Kathlene November, rheumatologist, he will follow-up with the patient as an outpatient. -Patient was placed on prednisone however currently on dexamethasone (neurosurgery) Fever has resolved.  He was noted to be on doxycycline since 10/18.  Reason is not entirely clear.  Discontinued on 10/31.  Recent PE /DVT in setting of spine surgery -Status post IVC filter placement on March 28, 2018 -Patient was on apixaban and changed over to IV heparin.  Anticoagulation however was discontinued for surgery.  Patient also noted to have thrombocytopenia. -Vascular DVT US 06/26/2018 showed bilateral lower extremity DVTs likely acute -Case was discussed with  vascular surgery, Dr. Trula Slade, patient is out of the 3 week window for treatment, he would have to get TPA, which is contraindicated in a bleeding patient.  -Bivalirudin discontinue by neurosurgery. Serotonin release assay negative.  - Patient has required multiple surgical evacuation of epidural hematoma. Difficult situation with patient recent thrombotic event.  Discussed with Dr Shick 10-28, thrombectomy only will not be enough to take care of B/L thrombus, patient will require anticoagulation, TPA. Patient is too high risk for bleeding, with recurrent epidural hematoma. Patient can not received anticoagulation at this time. Also Dr Saintclair Halsted will need to clear patient to be on anticoagulation.  This remains a very challenging scenario.  Patient frustrated by this.  Normocytic anemia Drop in hemoglobin noted.  No evidence of overt bleeding.  Transfused 2 units of PRBC.  Gout Stable.  Continue Uloric  Mild acute renal failure Resolved.   Essential hypertension  Amlodipine discontinue due to orthostatic hypotension and edema.  Blood pressure remains stable.  Some degree of hypertension likely due to Midrin.  Hyperglycemia; from steroids.  Dose of Lantus increased over  the last 2  days.  CBGs remain poorly controlled.  We will increase the dose further.    DVT prophylaxis: TED's.  No anticoagulants due to frequent epidural bleeding Code Status: full code.  Family Communication: discussed with patient,  Disposition Plan: Patient remains tenuous from a medical standpoint due to recurrent episodes of epidural hematoma.    Consultants:   Neurosurgery  Hematology  Cardiology    Procedures: On 10/2 1. posterior cervical decompressive laminectomy at C3, C4, C5 with removal of the spinous process and complete laminectomy and foraminotomies of the C3, C4, C5 nerve roots. 2. Posterior cervical fusion with lateral mass screws at C3-C4-C5 and pars screws at C2 lies in the globus ellipseLateral mass screw system 3. Posterior lateral arthrodesis C2-C5 utilizing locally harvested autograft mixed with vivigen  06/22/2018: Procedure reexploration posterior cervical wound for evacuation of epidural hematoma  06/26/2018 DVT ultrasound:Right: Findings consistent with acute deep vein thrombosis involving the right common femoral vein, right femoral vein, right popliteal vein, right posterior tibial vein, and right peroneal vein. Thrombosis extends proximally into external iliac vein.  Proximal iliac vein not visualized. Left: Findings consistent with acute deep vein thrombosis involving the left common femoral vein, left femoral vein, left popliteal vein, left posterior tibial vein, and left peroneal vein. Findings consistent with acute superficial vein thrombosis  involving the left great saphenous vein. Thrombosis extends proximally into external iliac vein. Proximal iliac vein not visualized.  07/01/18 Clot evacuation   RUE doppler 07/02/18    Antimicrobials:  Ancef.   Subjective: He denies any complaints this morning.  He continues to have neck pain and weakness in his arms and legs.  Objective: Vitals:   07/14/18 0937 07/14/18 0952 07/14/18  1100 07/14/18 1138  BP: (!) 136/98 132/88 134/88 138/82  Pulse: (!) 52 (!) 53 (!) 50 (!) 48  Resp: '15 19 15 16  ' Temp: 97.7 F (36.5 C) (!) 97.5 F (36.4 C) (!) 97.5 F (36.4 C) (!) 97.5 F (36.4 C)  TempSrc: Oral Axillary Axillary Axillary  SpO2: 100% 100% 100% 100%  Weight:      Height:        Intake/Output Summary (Last 24 hours) at 07/14/2018 1235 Last data filed at 07/14/2018 1200 Gross per 24 hour  Intake 5067.87 ml  Output 1275 ml  Net 3792.87 ml   Filed Weights   06/14/18 1151 06/22/18 1641 07/07/18 1000  Weight: 123.8 kg 123.8 kg 123.8 kg    Examination:  General exam: No distress Respiratory system: Normal effort at rest.  Clear to auscultation bilaterally Cardiovascular system: S1-S2 is normal regular.  Slightly bradycardic this morning.  No S3-S4 Gastrointestinal system: Abdomen remains soft.  Nontender nondistended.    Data Reviewed: I have personally reviewed following labs and imaging studies  CBC: Recent Labs  Lab 07/09/18 0834 07/10/18 0500 07/11/18 0605 07/11/18 1854 07/12/18 0500 07/13/18 0600 07/14/18 0000  WBC 6.5 5.4 5.2 5.5 5.8 5.7  --   NEUTROABS 5.7  --  4.7  --   --   --   --   HGB 9.6* 9.3* 8.0* 8.1* 7.4* 7.8* 6.9*  HCT 31.7* 31.0* 25.8* 25.9* 24.0* 25.1* 22.3*  MCV 99.1 98.4 98.5 98.9 98.8 100.0  --   PLT 60* 66* 62* 87* 76* 66*  --    Basic Metabolic Panel: Recent Labs  Lab 07/08/18 0500 07/09/18 0515 07/10/18 0747 07/11/18 1350 07/12/18 0500  NA 140 138 139 139 140  K 4.6 4.7 4.4 4.6 4.1  CL 105 106 106  105 107  CO2 '27 26 26 23 27  ' GLUCOSE 292* 315* 296* 378* 298*  BUN 38* 41* 43* 38* 36*  CREATININE 0.85 0.90 1.05 0.93 0.75  CALCIUM 9.2 9.1 9.0 8.8* 8.7*   GFR: Estimated Creatinine Clearance: 140.4 mL/min (by C-G formula based on SCr of 0.75 mg/dL).  Coagulation Profile: Recent Labs  Lab 07/09/18 0834 07/12/18 0500 07/13/18 0630  INR 1.33 1.29 1.31   CBG: Recent Labs  Lab 07/13/18 1141 07/13/18 1700  07/13/18 2147 07/14/18 0740 07/14/18 1143  GLUCAP 298* 268* 330* 260* 287*      Radiology Studies: No results found.      Scheduled Meds: . sodium chloride   Intravenous Once  . sodium chloride   Intravenous Once  . dexamethasone  8 mg Intravenous Q6H  . docusate sodium  100 mg Oral Daily  . enalaprilat  1.25 mg Intravenous Once  . febuxostat  40 mg Oral Daily  . gabapentin  300 mg Oral TID  . Gerhardt's butt cream   Topical BID  . Influenza vac split quadrivalent PF  0.5 mL Intramuscular Tomorrow-1000  . insulin aspart  0-5 Units Subcutaneous QHS  . insulin aspart  0-9 Units Subcutaneous TID WC  . insulin glargine  25 Units Subcutaneous QHS  . methocarbamol  750 mg Oral QID  . midodrine  5 mg Oral TID WC  . nystatin  5 mL Oral TID AC & HS  . senna-docusate  1 tablet Oral QHS  . sodium chloride flush  10-40 mL Intracatheter Q12H   Continuous Infusions: .  ceFAZolin (ANCEF) IV 2 g (07/14/18 0535)  . lactated ringers 10 mL/hr at 07/10/18 0900     LOS: 65 days     Bonnielee Haff, MD [facility] Pager [phone]  If 7PM-7AM, please contact night-coverage [email or web address] Password [facility] 07/14/2018, 12:35 PM

## 2018-07-14 NOTE — Progress Notes (Signed)
OT Cancellation Note  Patient Details Name: [name] [name] MRN: [id number] DOB: 1966/02/14   Cancelled Treatment:    Reason Eval/Treat Not Completed: Other (comment). Pt reports he is just too tired to work on arm exercises/activities. He is agreeable to work on self feeding next session.  Ignacia Palma, OTR/L Acute Rehab Services Pager [phone] Office [phone]     Evette Georges 07/14/2018, 4:20 PM

## 2018-07-14 NOTE — Progress Notes (Addendum)
1343 MD paged regarding pt HR. Pt HR slowly dropped today starting in 70s to now staying around 40.  Pt resting, very tired.    1349 MD returned page, EKG ordered. To make MD aware if pt has pauses.

## 2018-07-14 NOTE — Progress Notes (Signed)
Pt with 2 units blood finishing upon start of shift, and then cryo transfusing. Labs and Stat labs will be drawn once cryo finishes. Spoke with lab. MD aware.

## 2018-07-14 NOTE — Progress Notes (Signed)
Physical Therapy Treatment Patient Details Name: [name] [name] MRN: [id number] DOB: Jun 07, 1966 Today's Date: 07/14/2018    History of Present Illness 52 y.o. male admitted on 06/06/18 for bil LE wekness and muscle spasm after multiple falls (with resultant R knee meniscus tear placed in bledsoe brace) following a recent ACDF at C3 in July 2019 (complicated by PE (s/p IVC filter), CIR, and then SNF placement before finally returning home with his parents).  In the ED MRI of c-spine which showed post operative changes and cord edema and possibility of ischemia.  His R knee was aspirated on 06/09/18.  S/p posterior cervical decompression and fusion on 06/14/18. Entire acute course complicated by hypotension in standing (thought to be autonomic) and by finding of multiple LE DVTs (already has an IVC filter in place, but per MDs notes at risk for blocking the filter with these clots).  Pt with post op issues with incisional bleeding s/p 2 evacuations of hematomas and placement of hemovac (06/22/18 and 07/01/18) as well as multiple units of plasma.  Cardiology consulted 07/05/18 due to bradycardia thought to be from his newly started blood thinner.  PMHx of HTN, gout, anemia, DDD (lumbar).  Pt with another re-accumulation of epidural hematoma with increased L sided weakness and cord compression with emergent evacuation surgery 07/10/18 late in the PM.  Pt also needing more platelets post op.  Bone marrow bx scheduled for 07/12/18.    PT Comments    Limited session today as pt is fatigued after getting blood in the middle of the night last night.  He is more flat today than even yesterday, wanting answers, but having very little.  He was agreeable for OOB to chair, but continues to state, "What's the point?" with every session.   He would continue to benefit from EOB sitting balance training, education on pressure relief (getting him to ask RNs to turn him every two hours), and soon introducing an appropriate WC  (20x20 tilt in space with pressure cushion and headrest).  I may look into borrowing one from rehab next week.     Follow Up Recommendations  SNF;LTACH     Equipment Recommendations  Wheelchair (measurements PT);Wheelchair cushion (measurements PT);Hospital bed(hoyer lift 20 x 20 tilt in space WC, roho)    Recommendations for Other Services   NA     Precautions / Restrictions Precautions Precautions: Fall;Cervical Precaution Comments: hemovac Required Braces or Orthoses: Other Brace/Splint;Cervical Brace Knee Immobilizer - Right: On when out of bed or walking Cervical Brace: Hard collar;At all times Other Brace/Splint: Use locked bledsoe when and if he can start attempting to stand again.      Mobility  Bed Mobility               General bed mobility comments: Pt was already on lift pad reluctant to participate today, but agreeable for OOB to the new wider recliner chair.   Transfers Overall transfer level: Needs assistance               General transfer comment: Maxi move used to assist pt OOB to the chair for upright positioning.   Ambulation/Gait             General Gait Details: unable at this time.           Balance       Sitting balance - Comments: Pt did not feel up for sitting EOB. He continues to say, "what's the point?"  I educated him that it is good  for his body to change positions frequently.                                      Cognition Arousal/Alertness: Awake/alert Behavior During Therapy: Flat affect Overall Cognitive Status: Impaired/Different from baseline Area of Impairment: Memory                     Memory: Decreased short-term memory         General Comments: Pt is getting his days confused. Referenced his biopsy being yesterday, but it was two days ago, referenced it being Thursday, but it is Friday.  He reports he did not get much sleep last night as he was awake frequently due to the blood.           General Comments General comments (skin integrity, edema, etc.): HR back in the 40s to low 50s today.  BP stable and O2 stable on RA.        Pertinent Vitals/Pain Pain Assessment: 0-10 Pain Score: 7  Pain Location: posterior neck Pain Descriptors / Indicators: Guarding;Grimacing Pain Intervention(s): Limited activity within patient's tolerance;Monitored during session;Repositioned           PT Goals (current goals can now be found in the care plan section) Acute Rehab PT Goals Patient Stated Goal: to have answers, know a plan, get better Progress towards PT goals: Not progressing toward goals - comment(continued medical and physical decline)    Frequency    Min 2X/week      PT Plan Current plan remains appropriate       AM-PAC PT "6 Clicks" Daily Activity  Outcome Measure  Difficulty turning over in bed (including adjusting bedclothes, sheets and blankets)?: Unable Difficulty moving from lying on back to sitting on the side of the bed? : Unable Difficulty sitting down on and standing up from a chair with arms (e.g., wheelchair, bedside commode, etc,.)?: Unable Help needed moving to and from a bed to chair (including a wheelchair)?: Total Help needed walking in hospital room?: Total Help needed climbing 3-5 steps with a railing? : Total 6 Click Score: 6    End of Session Equipment Utilized During Treatment: Cervical collar Activity Tolerance: Patient limited by pain Patient left: in chair;with call bell/phone within reach;with family/visitor present Nurse Communication: Mobility status PT Visit Diagnosis: Other abnormalities of gait and mobility (R26.89);Other symptoms and signs involving the nervous system (R29.898);Muscle weakness (generalized) (M62.81)(quadraplegia)     Time: 1200-1230 PT Time Calculation (min) (ACUTE ONLY): 30 min  Charges:  $Therapeutic Activity: 23-37 mins                    Chardonay Scritchfield B. Kewan Mcnease, PT, DPT  Acute  Rehabilitation [phone] pager #[phone] office   07/14/2018, 12:45 PM

## 2018-07-14 NOTE — Progress Notes (Signed)
CRITICAL VALUE ALERT  Critical Value:  Hgb 6.9  Date & Time Notied:  11/1 0100  Provider Notified: 11/1 0105  Orders Received/Actions taken: 11/1 112  2 Units PRBC's to be transfused

## 2018-07-14 NOTE — Progress Notes (Signed)
The bone marrow report came back.  I will have to talk to the pathologist.  I am still not sure what the bone marrow report shows.  I do not know if there are not increased megakaryocytes.  I do not know if there are decreased megakaryocytes.  The report is somewhat vague about this.  I really doubt that he has myelodysplasia since his platelet count was normal when he came in.  I have to believe that this is some type of medication effect.  Unfortunately, I am not sure which medication it could be.  We have the low fibrinogen level to think about.  I suppose this might be a factor with bleeding.  I would probably consider some cryoprecipitate infusion to see if that can bring up the fibrinogen level.  He has had no bleeding.  The nurse says there is no further output from the drain from his surgical site.  I do not know this is going to be removed today.  The hemoglobin is 6.9.  He got a unit of blood and will get another unit of blood.  I will check an erythropoietin level on him.  Given the fact that his blood sugars are so high, he could certainly have a low erythropoietin level.  He is getting some physical therapy.  He is having bowel movements.  There is no blood with the bowel movements.  I will try to see what the cryoprecipitate will do for his fibrinogen level.  I would think would be unusual to have bleeding with the fibrinogen being a little bit low.  It is not drastically low.  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  This is still quite complex.  We will still follow along closely.  He will be nice if his blood sugars were better controlled.  This might help with any coagulopathy that he might have.  [name], MD  Jeri Modena 33:3

## 2018-07-15 ENCOUNTER — Inpatient Hospital Stay (HOSPITAL_COMMUNITY)

## 2018-07-15 DIAGNOSIS — D649 Anemia, unspecified: Secondary | ICD-10-CM

## 2018-07-15 DIAGNOSIS — F4323 Adjustment disorder with mixed anxiety and depressed mood: Secondary | ICD-10-CM | POA: Diagnosis present

## 2018-07-15 DIAGNOSIS — F332 Major depressive disorder, recurrent severe without psychotic features: Secondary | ICD-10-CM

## 2018-07-15 DIAGNOSIS — F419 Anxiety disorder, unspecified: Secondary | ICD-10-CM

## 2018-07-15 LAB — PREPARE CRYOPRECIPITATE
Unit division: 0
Unit division: 0

## 2018-07-15 LAB — RETICULOCYTES
Immature Retic Fract: 16.7 % — ABNORMAL HIGH (ref 2.3–15.9)
RBC.: 3.53 MIL/uL — AB (ref 4.22–5.81)
RETIC COUNT ABSOLUTE: 99.5 10*3/uL (ref 19.0–186.0)
Retic Ct Pct: 2.8 % (ref 0.4–3.1)

## 2018-07-15 LAB — CBC
HCT: 18.6 % — ABNORMAL LOW (ref 39.0–52.0)
HEMATOCRIT: 33.4 % — AB (ref 39.0–52.0)
HEMOGLOBIN: 5.7 g/dL — AB (ref 13.0–17.0)
HEMOGLOBIN: 10.7 g/dL — AB (ref 13.0–17.0)
MCH: 30.2 pg (ref 26.0–34.0)
MCH: 30.3 pg (ref 26.0–34.0)
MCHC: 30.6 g/dL (ref 30.0–36.0)
MCHC: 32 g/dL (ref 30.0–36.0)
MCV: 98.4 fL (ref 80.0–100.0)
MCV: 94.6 fL (ref 80.0–100.0)
Platelets: 37 10*3/uL — ABNORMAL LOW (ref 150–400)
Platelets: 58 10*3/uL — ABNORMAL LOW (ref 150–400)
RBC: 1.89 MIL/uL — AB (ref 4.22–5.81)
RBC: 3.53 MIL/uL — ABNORMAL LOW (ref 4.22–5.81)
RDW: 19.8 % — ABNORMAL HIGH (ref 11.5–15.5)
RDW: 18.8 % — ABNORMAL HIGH (ref 11.5–15.5)
WBC: 4.5 10*3/uL (ref 4.0–10.5)
WBC: 7.4 10*3/uL (ref 4.0–10.5)
nRBC: 0.7 % — ABNORMAL HIGH (ref 0.0–0.2)
nRBC: 0.7 % — ABNORMAL HIGH (ref 0.0–0.2)

## 2018-07-15 LAB — GLUCOSE, CAPILLARY
GLUCOSE-CAPILLARY: 197 mg/dL — AB (ref 70–99)
Glucose-Capillary: 211 mg/dL — ABNORMAL HIGH (ref 70–99)
Glucose-Capillary: 195 mg/dL — ABNORMAL HIGH (ref 70–99)
Glucose-Capillary: 249 mg/dL — ABNORMAL HIGH (ref 70–99)

## 2018-07-15 LAB — COMPREHENSIVE METABOLIC PANEL
ALK PHOS: 59 U/L (ref 38–126)
ALT: 6 U/L (ref 0–44)
ANION GAP: 6 (ref 5–15)
AST: 19 U/L (ref 15–41)
Albumin: 2.8 g/dL — ABNORMAL LOW (ref 3.5–5.0)
BUN: 32 mg/dL — ABNORMAL HIGH (ref 6–20)
CO2: 27 mmol/L (ref 22–32)
Calcium: 9.1 mg/dL (ref 8.9–10.3)
Chloride: 107 mmol/L (ref 98–111)
Creatinine, Ser: 0.76 mg/dL (ref 0.61–1.24)
GLUCOSE: 279 mg/dL — AB (ref 70–99)
Potassium: 4 mmol/L (ref 3.5–5.1)
Sodium: 140 mmol/L (ref 135–145)
TOTAL PROTEIN: 5.3 g/dL — AB (ref 6.5–8.1)
Total Bilirubin: 0.7 mg/dL (ref 0.3–1.2)

## 2018-07-15 LAB — PREPARE RBC (CROSSMATCH)

## 2018-07-15 LAB — BPAM CRYOPRECIPITATE
Blood Product Expiration Date: 201911011335
Blood Product Expiration Date: 201911011335
ISSUE DATE / TIME: 201911010830
ISSUE DATE / TIME: 201911010830
Unit Type and Rh: 5100
Unit Type and Rh: 5100

## 2018-07-15 LAB — APTT: APTT: 24 s (ref 24–36)

## 2018-07-15 LAB — FIBRINOGEN: FIBRINOGEN: 159 mg/dL — AB (ref 210–475)

## 2018-07-15 MED ORDER — DEXAMETHASONE SODIUM PHOSPHATE 10 MG/ML IJ SOLN
8.0000 mg | Freq: Two times a day (BID) | INTRAMUSCULAR | Status: DC
  Administered 2018-07-15 – 2018-07-17 (×4): 8 mg via INTRAVENOUS
  Filled 2018-07-15 (×4): qty 1

## 2018-07-15 MED ORDER — MIDODRINE HCL 5 MG PO TABS
5.0000 mg | ORAL_TABLET | Freq: Three times a day (TID) | ORAL | Status: DC
  Administered 2018-07-16 (×2): 5 mg via ORAL
  Filled 2018-07-15 (×2): qty 1

## 2018-07-15 MED ORDER — SODIUM CHLORIDE 0.9% IV SOLUTION: Freq: Once | INTRAVENOUS | Status: DC

## 2018-07-15 MED ORDER — FUROSEMIDE 10 MG/ML IJ SOLN
20.0000 mg | Freq: Once | INTRAMUSCULAR | Status: AC
  Administered 2018-07-15: 20 mg via INTRAVENOUS
  Filled 2018-07-15: qty 2

## 2018-07-15 MED ORDER — IOPAMIDOL (ISOVUE-300) INJECTION 61%
100.0000 mL | Freq: Once | INTRAVENOUS | Status: AC | PRN
  Administered 2018-07-15: 100 mL via INTRAVENOUS

## 2018-07-15 MED ORDER — SODIUM CHLORIDE 0.9% IV SOLUTION
Freq: Once | INTRAVENOUS | Status: AC
  Administered 2018-07-16: 06:00:00 via INTRAVENOUS

## 2018-07-15 MED ORDER — SODIUM CHLORIDE 0.9 % IV SOLN
40.0000 mg | Freq: Two times a day (BID) | INTRAVENOUS | Status: DC
  Administered 2018-07-21: 40 mg via INTRAVENOUS
  Filled 2018-07-15 (×21): qty 4

## 2018-07-15 MED ORDER — FUROSEMIDE 10 MG/ML IJ SOLN: 20.0000 mg | Freq: Once | INTRAMUSCULAR | Status: DC

## 2018-07-15 NOTE — Progress Notes (Signed)
Critical Hgb of 5.7 called up from lab. MD paged, awaiting orders.

## 2018-07-15 NOTE — Progress Notes (Addendum)
It seems as if the situation is a little bit more serious this morning.  I am not sure as to why his platelet count dropped down to 37,000.  His hemoglobin was 5.7.  I cannot find any obvious bleeding on him.  He is on Decadron.  I really think we have to cut back on the Decadron.  Maybe he has a little bit of gastritis.  I will add some IV Pepcid.  Unfortunately, the pathologist who read his bone marrow it was off yesterday.  I really need to talk to her to give me some more information about what they found on the bone marrow biopsy.  I am going to get a CT scan on him today to see if we can see any obvious bleeding.  His fibrinogen level did come up with the cryoprecipitate.  I am going to give him another dose of cryoprecipitate to try to get his levels up a little bit higher.  He is going to get blood and platelets today.  I will order a reticulocyte count on him.  I want to see if he is hemolyzing.  His ADAMTS13 was a little bit low but not to the level that would signify TTP.  I just feel bad that he is still having a hard time.  I did speak with his dad by phone today.  His father is incredibly nice.  His father has a very strong faith.  We actually shared scripture over the phone.  I still have to believe that the bone marrow effect must be from some medication that he is on.  I would try to stop as much as we can to see if this can help the situation.  The drainage catheter is still in his surgical site.  This is still an incredibly complex case.  I still wish that there was a unifying diagnosis that we could go with.  I have yet to find this.  I know everybody up on 4 NP is doing a great job with him.  They are showing a lot of compassion and professionalism.  This does help Mr. Leipold tremendously.  We will continue to follow him along closely.  [name], MD  [facility] [phone]

## 2018-07-15 NOTE — Progress Notes (Signed)
Subjective: Patient reports unchanged weakness in entire left side and leg worse than arm  Objective: Vital signs in last 24 hours: Temp:  [97.5 F (36.4 C)-98.2 F (36.8 C)] 98 F (36.7 C) (11/02 0710) Pulse Rate:  [41-79] 41 (11/02 0710) Resp:  [13-21] 15 (11/02 0710) BP: (118-145)/(76-98) 118/76 (11/02 0710) SpO2:  [98 %-100 %] 99 % (11/02 0710)  Intake/Output from previous day: 11/01 0701 - 11/02 0700 In: 2126.8 [P.O.:960; I.V.:113.8; Blood:653; IV Piggyback:400] Out: 1485 [Urine:1425; Drains:60] Intake/Output this shift: No intake/output data recorded.  Physical Exam: Weak grip right with good biceps and some triceps, very weak in right leg, no useful function left arm or leg.  Lab Results: Recent Labs    07/14/18 1312 07/15/18 0514  WBC 5.9 4.5  HGB 8.2* 5.7*  HCT 25.2* 18.6*  PLT 53* 37*   BMET Recent Labs    07/14/18 1312  NA 141  K 3.9  CL 111  CO2 28  GLUCOSE 279*  BUN 28*  CREATININE 0.62  CALCIUM 8.2*    Studies/Results: No results found.  Assessment/Plan: Hgb and plts very low.  To receive blood and platelet transfusions this am.    LOS: 38 days    Dorian Heckle, MD 07/15/2018, 7:16 AM

## 2018-07-15 NOTE — Plan of Care (Signed)

## 2018-07-15 NOTE — Progress Notes (Signed)
PROGRESS NOTE    [name] [name]  YCX:[id number] DOB: 08/18/1966 DOA: 06/06/2018 PCP: Helane Rima, MD    Brief Narrative: Brief Narrative:  52 year old man with past medical history relevant for gout, hypertension anterior cervical corpectomy of C4 on 03/24/2018 with comp gated postoperative course including development of multiple PEs and DVTs started on anticoagulation and subsequently developed hematoma requiring reexploration on 03/28/2018 status post placement of IVC filter who was discharged to skilled nursing facility and had a complicated course including progressive lower extremity weakness, intermittent fevers of unclear etiology and hospitalizations for "pneumonia". Patient was admitted to this hospital is a transfer from [facility] with MRI of C-spine that showed postoperative changes and cord compression with cord edema and concern for ischemia. Patient is status post posterior cervical decompression laminectomy of C3, C4, C5, removal of spinous processes, foraminotomies on 06/14/2018. On 06/22/2018 patient began to develop worsening left sided paresthesias and weakness and a stat MRI showed evidence of postoperative hematoma. Patient was taken to the OR with evacuation of hematoma. Additionally his postoperative course has been complicated by recurrent fevers,bilateral lower extremity DVTs on ultrasound,migratory arthralgias/arthritis that are thought to be rheumatologic in nature as well as significant orthostasis limiting the amount he can work with physical therapy. His arthralgia has resolved.   Noted to have decreased left hand grip on 10/19, MRI showed large epidural hematoma, patient s/p clot evacuation.  He was place back on anticoagulation argatroban on 10-22. He was notice to be more bradycardic with HR in the 40 on 10-23. Cardiology was consulted, and argatroban was transition to bivalirrubin to avoid bradycardia. His HR improved after argatroban was discontinue.  -HIT panel 0.486 (mildly elevated)- SRA (serotonin release assay) negative. Dr Marin Olp has been helping with patient care. He is recommending to avoid heparin or Lovenox products.   Patient developed weakness of left arm on 10-25. He was take emergently to OR for recurrence of epidural hematoma on 10-25. Anticoagulation was discontinue since 10-25. Dr Saintclair Halsted is recommending for patient to remain off anticoagulation for alt leas 1 week, and if anticoagulation is consider, that will need to be discuss with DR Saintclair Halsted.  Patient the night of 10-28 develops left arm weakness, numbness. Stat MRI was ordered, which confirm recurrent hematoma. Patient was taken to OR for hematoma evacuation 10-28. He has been off anticoagulation since 10-25. Patient will received platelet transfusion 10-29.  For his thrombocytopenia, a Bone marrow biopsy is planned for 10-30. For LE DVT, he has IVC filter. He cant not be on anticoagulation due to recurrent bleeding.     Assessment & Plan:   Principal Problem:   Fever Active Problems:   Myelopathy (HCC)   Benign essential HTN   Cord compression (HCC)   History of pulmonary embolism   Spondylosis, cervical, with myelopathy   Labile blood pressure   Orthostatic hypotension   Leukocytosis   Tachycardia-bradycardia syndrome (HCC)   Near syncope due to orthostatic hypotension Thought to be related to hypovolemia and autonomic dysfunction.  Echocardiogram July 2019 showed a normal systolic function, grade 2 diastolic dysfunction.  No significant valve abnormalities noted. Patient was placed on midodrine.  On hold now due to issues with bradycardia.  Abdominal binder in place.    Sinus bradycardia Patient initially developed bradycardia on 10/23.  He was asymptomatic.  Cardiology was consulted at that time.  There was no indication for pacemaker.  His heart rate stabilized and improved.  He again became bradycardic on 11/1.  EKG shows sinus bradycardia.  Telemetry does  not show any pauses.  Heart rate noted to be in the 50s this morning.  We will hold his midodrine for additional day.  He is not on any AV nodal blocking agents.    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 Difficult situation with patient recent thrombotic event. -He developed left arm weakness 10-25- and 10-28, he was take to OR for epidural hematoma evacuation.  Neurosurgery continues to follow.  Defer management to them.  He remains on dexamethasone.  He remains on Ancef.  Patient remain on antibiotics til the drains are in place.  Normocytic anemia Significant drop in hemoglobin noted this morning from 8.2 yesterday to 5.7.  Patient has not had any overt bleeding.  His LFTs are normal.  There is no indication of hemolysis.  Vital signs have been stable except for bradycardia which he has had since yesterday.  Has metabolic panel also show metabolic derangements.  These are all new compared to yesterday.  Wonder if the blood sample was diluted at blood draw.  Patient has already been transfused 1 unit of blood.  Discussed with the nursing staff.  We will redraw these labs before transfusing him any further or addressing any of these metabolic derangements.  He was transfused 2 units on 11/1.  It also appears that hematology/oncology has ordered CT scan of his chest abdomen and pelvis.  Thrombocytopenia/coagulopathy -Cardiolipin antibody negative -Lupus anticoagulant, homocysteine level, factor VIII within normal limits -Protein S activity 60  (low) -Protein C total >200; Activity 199 (high)  -HIT panel 0.486 (mildly elevated)- SRA (serotonin release assay) negative -immature platelet fraction WNL -Was given 2u platelets on 10/19 in the OR. And 2 units 10-25 for OR. Platelet transfusion 10/29 Patient transfused platelets and given vitamin K on 10/30. Patient underwent bone marrow biopsy on 10/30.  Pathology report suggests nonspecific findings.  Await further hematology input.  Blood counts remain low.  Patient was also given cryoprecipitate.  Recheck labs today.  Polyarthralgia/persistent fever -At the time of admission patient had symptoms in the shoulders and ankles, along with a history of gout and pain in his knees.  He underwent arthrocentesis however no evidence of infection was found. -He was noted to have elevated inflammatory markers including CCP, CRP.  However ANCA, aldolase, ANA were negative. -Previous rounding physicians have discussed with Dr. Kathlene November, rheumatologist, he will follow-up with the patient as an outpatient. -Patient was placed on prednisone however currently on dexamethasone (neurosurgery) Fever has resolved.  He was noted to be on doxycycline since 10/18.  Reason is not entirely clear.  Discontinued on 10/31.  Remains afebrile.  Recent PE /DVT in setting of spine surgery -Status post IVC filter placement on March 28, 2018 -Patient was on apixaban and changed over to IV heparin.  Anticoagulation however was discontinued for surgery.  Patient also noted to have thrombocytopenia. -Vascular DVT US 06/26/2018 showed bilateral lower extremity DVTs likely acute -Case was discussed with  vascular surgery, Dr. Trula Slade, patient is out of the 3 week window for treatment, he would have to get TPA, which is contraindicated in a bleeding patient.  -  Bivalirudin discontinue by neurosurgery. Serotonin release assay negative.  - Patient has required multiple surgical evacuation of epidural hematoma. Difficult situation with  patient recent thrombotic event.  Discussed with Dr Shick 10-28, thrombectomy only will not be enough to take care of B/L thrombus, patient will require anticoagulation, TPA. Patient is too high risk for bleeding, with recurrent epidural hematoma. Patient can not received anticoagulation at this time. Also Dr Saintclair Halsted will need to clear patient to be on anticoagulation.   This remains a very challenging scenario.  Patient frustrated by this.  Gout Stable.  Continue Uloric  Mild acute renal failure Resolved.  Metabolic derangements noted on the metabolic panel.  Blood to be redrawn to confirm.  Essential hypertension  Amlodipine discontinue due to orthostatic hypotension and edema.  Blood pressure remains stable.  Some degree of hypertension likely due to midodrine.  Hyperglycemia; from steroids.  Dose of Lantus was increased again yesterday.  CBG appeared to be better controlled.  No further changes in dose today.  Continue to monitor for now.   No recent HbA1c in our system.  This will be ordered.  DVT prophylaxis: TED's.  No anticoagulants due to frequent epidural bleeding Code Status: full code.  Family Communication: discussed with patient,  Disposition Plan: Patient remains tenuous from a medical standpoint due to recurrent episodes of epidural hematoma.    Consultants:   Neurosurgery  Hematology  Cardiology    Procedures: On 10/2 1. posterior cervical decompressive laminectomy at C3, C4, C5 with removal of the spinous process and complete laminectomy and foraminotomies of the C3, C4, C5 nerve roots. 2. Posterior cervical fusion with lateral mass screws at C3-C4-C5 and pars screws at C2 lies in the globus ellipseLateral mass screw system 3. Posterior lateral arthrodesis C2-C5 utilizing locally harvested autograft mixed with vivigen  06/22/2018: Procedure reexploration posterior cervical wound for evacuation of epidural hematoma  06/26/2018 DVT ultrasound:Right:  Findings consistent with acute deep vein thrombosis involving the right common femoral vein, right femoral vein, right popliteal vein, right posterior tibial vein, and right peroneal vein. Thrombosis extends proximally into external iliac vein.  Proximal iliac vein not visualized. Left: Findings consistent with acute deep vein thrombosis involving the left common femoral vein, left femoral vein, left popliteal vein, left posterior tibial vein, and left peroneal vein. Findings consistent with acute superficial vein thrombosis  involving the left great saphenous vein. Thrombosis extends proximally into external iliac vein. Proximal iliac vein not visualized.  07/01/18 Clot evacuation   RUE doppler 07/02/18    Antimicrobials:  Ancef.   Subjective: Patient denies any new complaints.  Still has weakness in his arms and legs left more than right.    Objective: Vitals:   07/15/18 0652 07/15/18 0710 07/15/18 0923 07/15/18 1002  BP: 121/78 118/76 136/83 119/87  Pulse: (!) 47 (!) 41 (!) 37 (!) 58  Resp: '16 15 14 18  ' Temp: 98.1 F (36.7 C) 98 F (36.7 C) 98.2 F (36.8 C) 97.7 F (36.5 C)  TempSrc: Oral Oral Oral   SpO2: 100% 99% 100% 100%  Weight:      Height:        Intake/Output Summary (Last 24 hours) at 07/15/2018 1022 Last data filed at 07/15/2018 0700 Gross per 24 hour  Intake 1244.83 ml  Output 1095 ml  Net 149.83 ml   Filed Weights   06/14/18 1151 06/22/18 1641 07/07/18 1000  Weight: 123.8 kg 123.8 kg 123.8 kg    Examination:  General exam: Awake alert.  In  no distress Respiratory system: Normal effort at rest.  Clear to auscultation bilaterally. Cardiovascular system: S1-S2 is normal regular.  Noted to be bradycardic.  Telemetry shows sinus bradycardia.  No pauses. Gastrointestinal system: Abdomen remains soft.  Nontender nondistended.  No masses organomegaly.    Data Reviewed: I have personally reviewed following labs and imaging studies  CBC: Recent Labs    Lab 07-23-2018 0834  07/11/18 0605 07/11/18 1854 07/12/18 0500 07/13/18 0600 07/14/18 0000 07/14/18 1312 07/15/18 0514  WBC 6.5   < > 5.2 5.5 5.8 5.7  --  5.9 4.5  NEUTROABS 5.7  --  4.7  --   --   --   --  5.2  --   HGB 9.6*   < > 8.0* 8.1* 7.4* 7.8* 6.9* 8.2* 5.7*  HCT 31.7*   < > 25.8* 25.9* 24.0* 25.1* 22.3* 25.2* 18.6*  MCV 99.1   < > 98.5 98.9 98.8 100.0  --  94.7 98.4  PLT 60*   < > 62* 87* 76* 66*  --  53* 37*   < > = values in this interval not displayed.   Basic Metabolic Panel: Recent Labs  Lab 07/10/18 0747 07/11/18 1350 07/12/18 0500 07/14/18 1312 07/15/18 0514  NA 139 139 140 141 142  K 4.4 4.6 4.1 3.9 2.8*  CL 106 105 107 111 121*  CO2 '26 23 27 28 ' 19*  GLUCOSE 296* 378* 298* 279* 163*  BUN 43* 38* 36* 28* 22*  CREATININE 1.05 0.93 0.75 0.62 0.43*  CALCIUM 9.0 8.8* 8.7* 8.2* 6.0*   GFR: Estimated Creatinine Clearance: 140.4 mL/min (A) (by C-G formula based on SCr of 0.43 mg/dL (L)).  Coagulation Profile: Recent Labs  Lab 2018-07-23 0834 07/12/18 0500 07/13/18 0630 07/14/18 1312  INR 1.33 1.29 1.31 1.36   CBG: Recent Labs  Lab 07/14/18 0740 07/14/18 1143 07/14/18 1714 07/14/18 2117 07/15/18 0738  GLUCAP 260* 287* 256* 212* 197*      Radiology Studies: No results found.      Scheduled Meds: . sodium chloride   Intravenous Once  . sodium chloride   Intravenous Once  . sodium chloride   Intravenous Once  . sodium chloride   Intravenous Once  . dexamethasone  8 mg Intravenous Q12H  . docusate sodium  100 mg Oral Daily  . enalaprilat  1.25 mg Intravenous Once  . febuxostat  40 mg Oral Daily  . furosemide  20 mg Intravenous Once  . gabapentin  300 mg Oral TID  . Gerhardt's butt cream   Topical BID  . Influenza vac split quadrivalent PF  0.5 mL Intramuscular Tomorrow-1000  . insulin aspart  0-5 Units Subcutaneous QHS  . insulin aspart  0-9 Units Subcutaneous TID WC  . insulin glargine  30 Units Subcutaneous QHS  . methocarbamol   750 mg Oral QID  . midodrine  5 mg Oral TID WC  . nystatin  5 mL Oral TID AC & HS  . senna-docusate  1 tablet Oral QHS  . sodium chloride flush  10-40 mL Intracatheter Q12H   Continuous Infusions: .  ceFAZolin (ANCEF) IV 2 g (07/15/18 0520)  . famotidine (PEPCID) IV    . lactated ringers 10 mL/hr at 07/10/18 0900     LOS: 81 days     Bonnielee Haff, MD [facility] Pager [phone]  If 7PM-7AM, please contact night-coverage [email or web address] Password [facility] 07/15/2018, 10:22 AM

## 2018-07-15 NOTE — Consult Note (Addendum)
[facility] Consult   Reason for Consult:  Depression and anxiety Referring Physician:  Dr [name] Patient Identification: Henson Fraticelli MRN:  [id number] Principal Diagnosis: Fever Diagnosis:   Patient Active Problem List   Diagnosis Date Noted  . Adjustment disorder with mixed anxiety and depressed mood [F43.23] 07/15/2018    Priority: High  . 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 Labile blood pressure [R09.89]   . Orthostatic hypotension [I95.1]   . Leukocytosis [D72.829]   . Fever [R50.9] 06/08/2018  . Cord compression ([place]) [G95.20] 06/07/2018  . History of pulmonary embolism [Z86.711] 06/07/2018  . Loose stools [R19.5]   . Acute idiopathic gout of left foot [M10.072]   . Hypoalbuminemia due to protein-calorie malnutrition ([facility]) [E46]   . PTSD (post-traumatic stress disorder) [F43.10]   . Sleep disorder [G47.9]   . Anasarca [R60.1]   . Thrombocytopenia ([place]) [D69.6]   . Morbid obesity ([place]) [E66.01]   . Acute blood loss anemia [D62]   . Benign essential HTN [I10]   . Cervical myelopathy ([place]) [G95.9] 04/11/2018  . Pressure injury of skin [L89.90] 04/10/2018  . Acute deep vein thrombosis (DVT) of lower extremity (HCC) [I82.409]   . Bleeding [R58]   . Pulmonary embolism (HCC)bilateral, failed heparin IVC filter placed7/16 [I26.99] 03/29/2018  . Preop cardiovascular exam [Z01.810]   . Acute deep vein thrombosis (DVT) of right lower extremity ([place]) [I82.401]   . Myelopathy ([place]) [G95.9] 03/22/2018    Total Time spent with patient: 45 minutes  Subjective:   Wilmot Marich is a 52 y.o. male patient reports depression and anxiety related to his complicated medical issues since July.  No suicidal/homicidal ideations, hallucinations, or substance abuse.  Recommend changing Xanax to Klonopin 0.5 mg BID (longer acting) PRN for anxiety and starting sertraline 25 mg daily for depression and anxiety IF  not contraindicated.  Dr Dwyane Dee reviewed this patient and concurs with the plan.  HPI:  52 yo male who started having depression in July with complications from surgery and no longer has the ability to walk due to DVTs and surgical issues.  His depression increased with his medical symptoms along with anxiety.  9/10 depression and 5/10 anxiety, no suicidal ideations.  Father has a history of depression and takes sertraline.  Started on Xanax which has some effect on his anxiety but short half-life.  Discussed his medical issues and explained some terminology and neuro conditions related to his chart documentation.  Crying on assessment and feels he cannot go on like he is anymore.  Very discouraged with no definitive diagnosis with a continuation of blood clots and bleeding.  Explained the lengthy process of nerve recovery and he seemed to understand.  We also let him know we were here to support his mental health needs.  Pleasant with a good family support system.  Appreciated the time spent with him and let him know we would return tomorrow to check on him.  Agreeable to an antidepressant.    Past Psychiatric History: none  Risk to Self:  none Risk to Others:  none Prior Inpatient Therapy:  none Prior Outpatient Therapy:  none  Past Medical History:  Past Medical History:  Diagnosis Date  . Degenerative disc disease, lumbar   . Gout   . History of pernicious anemia 1980   Patient says he takes OTC iron supplements QD  . Hypertension     Past Surgical History:  Procedure Laterality Date  . ANTERIOR  CERVICAL CORPECTOMY N/A 03/24/2018   Procedure: CERVICAL FOUR ANTERIOR CERVICAL CORPECTOMY;  Surgeon: Kary Kos, MD;  Location: [facility];  Service: Neurosurgery;  Laterality: N/A;  . ANTERIOR CERVICAL DECOMP/DISCECTOMY FUSION N/A 03/28/2018   Procedure: Reexploration of anterior cervical wound;  Surgeon: Kary Kos, MD;  Location: [facility];  Service: Neurosurgery;  Laterality: N/A;  . HEMATOMA  EVACUATION N/A 07/10/2018   Procedure: POSTERIOR CERVICAL HEMATOMA EVACUATION;  Surgeon: Judith Part, MD;  Location: [facility];  Service: Neurosurgery;  Laterality: N/A;  . IR IVC FILTER PLMT / S&I Burke Keels GUID/MOD SED  03/28/2018  . IR RADIOLOGIST EVAL & MGMT  05/31/2018  . MENISCUS REPAIR Left   . POSTERIOR CERVICAL FUSION/FORAMINOTOMY N/A 06/14/2018   Procedure: POSTERIOR CERVICAL DECOMPRESSION/FUSION CERVICAL TWO- CERVICAL THREE CERVICAL THREE- CERVICAL FOUR, CERVICAL FOUR- CERVICAL FIVE;  Surgeon: Kary Kos, MD;  Location: [facility];  Service: Neurosurgery;  Laterality: N/A;  POSTERIOR CERVICAL DECOMPRESSION/FUSION CERVICAL TWO- CERVICAL THREE CERVICAL THREE- CERVICAL FOUR, CERVICAL FOUR- CERVICAL FIVE  . POSTERIOR CERVICAL LAMINECTOMY N/A 06/22/2018   Procedure: POSTERIOR CERVICAL LAMINECTOMY FOR EVACUATION OF HEMATOMA;  Surgeon: Kary Kos, MD;  Location: [facility];  Service: Neurosurgery;  Laterality: N/A;  . POSTERIOR CERVICAL LAMINECTOMY N/A 07/07/2018   Procedure: Evacuation of Hematoma POSTERIOR CERVICAL;  Surgeon: Kary Kos, MD;  Location: [facility];  Service: Neurosurgery;  Laterality: N/A;  Evacuation of Hematoma POSTERIOR CERVICAL  . POSTERIOR CERVICAL LAMINECTOMY FOR EPIDURAL ABSCESS N/A 07/01/2018   Procedure: EVACUATION OF POSTERIOR CERVICAL HEMATOMA;  Surgeon: Judith Part, MD;  Location: [facility];  Service: Neurosurgery;  Laterality: N/A;  . TENDON REPAIR     tricept tendon   Family History:  Family History  Family history unknown: Yes   Family Psychiatric  History: none Social History:  Social History   Substance and Sexual Activity  Alcohol Use Never  . Frequency: Never     Social History   Substance and Sexual Activity  Drug Use Never    Social History   Socioeconomic History  . Marital status: Legally Separated    Spouse name: Not on file  . Number of children: Not on file  . Years of education: Not on file  . Highest education level: Not on file   Occupational History  . Not on file  Social Needs  . Financial resource strain: Not on file  . Food insecurity:    Worry: Not on file    Inability: Not on file  . Transportation needs:    Medical: Not on file    Non-medical: Not on file  Tobacco Use  . Smoking status: Never Smoker  . Smokeless tobacco: Never Used  Substance and Sexual Activity  . Alcohol use: Never    Frequency: Never  . Drug use: Never  . Sexual activity: Not Currently    Birth control/protection: Abstinence  Lifestyle  . Physical activity:    Days per week: Not on file    Minutes per session: Not on file  . Stress: Not on file  Relationships  . Social connections:    Talks on phone: Not on file    Gets together: Not on file    Attends religious service: Not on file    Active member of club or organization: Not on file    Attends meetings of clubs or organizations: Not on file    Relationship status: Not on file  Other Topics Concern  . Not on file  Social History Narrative  . Not on file  Additional Social History:    Allergies:   Allergies  Allergen Reactions  . Nsaids Other (See Comments)    G6PD DEFICIENCY  . Sulfa Antibiotics Other (See Comments)    G6PD DEFICIENCY  . Protonix [Pantoprazole Sodium] Diarrhea    Severe diarrhea, Upset stomach    Labs:  Results for orders placed or performed during the hospital encounter of 06/06/18 (from the past 48 hour(s))  Glucose, capillary     Status: Abnormal   Collection Time: 07/13/18  5:00 PM  Result Value Ref Range   Glucose-Capillary 268 (H) 70 - 99 mg/dL  Glucose, capillary     Status: Abnormal   Collection Time: 07/13/18  9:47 PM  Result Value Ref Range   Glucose-Capillary 330 (H) 70 - 99 mg/dL  Hemoglobin and hematocrit, blood     Status: Abnormal   Collection Time: 07/14/18 12:00 AM  Result Value Ref Range   Hemoglobin 6.9 (LL) 13.0 - 17.0 g/dL    Comment: REPEATED TO VERIFY THIS CRITICAL RESULT HAS VERIFIED AND BEEN CALLED TO  R.TUCKETT,RN BY GEOFFREY MCADOO ON 11 01 2019 AT 0107, AND HAS BEEN READ BACK. CRITICAL RESULT VERIFIED    HCT 22.3 (L) 39.0 - 52.0 %    Comment: Performed at [facility], [place] [address] [place], [place] [address]  Type and screen [place]     Status: None (Preliminary result)   Collection Time: 07/14/18  2:20 AM  Result Value Ref Range   ABO/RH(D) O POS    Antibody Screen NEG    Sample Expiration 07/17/2018    Unit Number [id number]    Blood Component Type RED CELLS,LR    Unit division 00    Status of Unit ISSUED,FINAL    Transfusion Status OK TO TRANSFUSE    Crossmatch Result      Compatible Performed at [facility], [place] [address], [place], [place] [address]    Unit Number [id number]    Blood Component Type RED CELLS,LR    Unit division 00    Status of Unit ISSUED,FINAL    Transfusion Status OK TO TRANSFUSE    Crossmatch Result Compatible    Unit Number [id number]    Blood Component Type RED CELLS,LR    Unit division 00    Status of Unit ISSUED    Transfusion Status OK TO TRANSFUSE    Crossmatch Result Compatible    Unit Number [id number]    Blood Component Type RED CELLS,LR    Unit division 00    Status of Unit ALLOCATED    Transfusion Status OK TO TRANSFUSE    Crossmatch Result Compatible    Unit Number [id number]    Blood Component Type RED CELLS,LR    Unit division 00    Status of Unit ALLOCATED    Transfusion Status OK TO TRANSFUSE    Crossmatch Result Compatible    Unit Number [id number]    Blood Component Type RBC LR PHER2    Unit division 00    Status of Unit ALLOCATED    Transfusion Status OK TO TRANSFUSE    Crossmatch Result Compatible   Prepare RBC     Status: None   Collection Time: 07/14/18  2:20 AM  Result Value Ref Range   Order Confirmation      ORDER PROCESSED BY BLOOD BANK Performed at [facility], [place] [address] [place], [place] [address]   Prepare cryoprecipitate     Status:  None  Collection Time: 07/14/18  7:35 AM  Result Value Ref Range   Unit Number [id number]    Blood Component Type CRYPOOL THAW    Unit division 00    Status of Unit ISSUED,FINAL    Transfusion Status OK TO TRANSFUSE    Unit Number [id number]    Blood Component Type CRYPOOL THAW    Unit division 00    Status of Unit ISSUED,FINAL    Transfusion Status      OK TO TRANSFUSE Performed at [facility] [address], [place], [place] [address]   Glucose, capillary     Status: Abnormal   Collection Time: 07/14/18  7:40 AM  Result Value Ref Range   Glucose-Capillary 260 (H) 70 - 99 mg/dL  Glucose, capillary     Status: Abnormal   Collection Time: 07/14/18 11:43 AM  Result Value Ref Range   Glucose-Capillary 287 (H) 70 - 99 mg/dL  Basic metabolic panel     Status: Abnormal   Collection Time: 07/14/18  1:12 PM  Result Value Ref Range   Sodium 141 135 - 145 mmol/L   Potassium 3.9 3.5 - 5.1 mmol/L   Chloride 111 98 - 111 mmol/L   CO2 28 22 - 32 mmol/L   Glucose, Bld 279 (H) 70 - 99 mg/dL   BUN 28 (H) 6 - 20 mg/dL   Creatinine, Ser 0.62 0.61 - 1.24 mg/dL   Calcium 8.2 (L) 8.9 - 10.3 mg/dL   GFR calc non Af Amer >60 >60 mL/min   GFR calc Af Amer >60 >60 mL/min    Comment: (NOTE) The eGFR has been calculated using the CKD EPI equation. This calculation has not been validated in all clinical situations. eGFR's persistently <60 mL/min signify possible Chronic Kidney Disease.    Anion gap 2 (L) 5 - 15    Comment: REPEATED TO VERIFY Performed at [facility] [address] [place], [place] [address]   Protime-INR     Status: Abnormal   Collection Time: 07/14/18  1:12 PM  Result Value Ref Range   Prothrombin Time 16.6 (H) 11.4 - 15.2 seconds   INR 1.36     Comment: Performed at [facility] [address] [place], [place] [address]  CBC with Differential/Platelet     Status: Abnormal   Collection Time: 07/14/18  1:12 PM  Result Value Ref Range   WBC  5.9 4.0 - 10.5 K/uL   RBC 2.66 (L) 4.22 - 5.81 MIL/uL   Hemoglobin 8.2 (L) 13.0 - 17.0 g/dL    Comment: REPEATED TO VERIFY POST TRANSFUSION SPECIMEN    HCT 25.2 (L) 39.0 - 52.0 %   MCV 94.7 80.0 - 100.0 fL   MCH 30.8 26.0 - 34.0 pg   MCHC 32.5 30.0 - 36.0 g/dL   RDW 19.6 (H) 11.5 - 15.5 %   Platelets 53 (L) 150 - 400 K/uL    Comment: REPEATED TO VERIFY Immature Platelet Fraction may be clinically indicated, consider ordering this additional test [id number] CONSISTENT WITH PREVIOUS RESULT    nRBC 0.8 (H) 0.0 - 0.2 %   Neutrophils Relative % 87 %   Neutro Abs 5.2 1.7 - 7.7 K/uL   Lymphocytes Relative 6 %   Lymphs Abs 0.3 (L) 0.7 - 4.0 K/uL   Monocytes Relative 5 %   Monocytes Absolute 0.3 0.1 - 1.0 K/uL   Eosinophils Relative 0 %   Eosinophils Absolute 0.0 0.0 - 0.5 K/uL   Basophils  Relative 0 %   Basophils Absolute 0.0 0.0 - 0.1 K/uL   Immature Granulocytes 2 %   Abs Immature Granulocytes 0.12 (H) 0.00 - 0.07 K/uL    Comment: Performed at [facility], [place] [address], [place], [place] [address]  APTT     Status: None   Collection Time: 07/14/18  1:12 PM  Result Value Ref Range   aPTT 24 24 - 36 seconds    Comment: Performed at [facility] [address], [place], [place] [address]  Glucose, capillary     Status: Abnormal   Collection Time: 07/14/18  5:14 PM  Result Value Ref Range   Glucose-Capillary 256 (H) 70 - 99 mg/dL  Glucose, capillary     Status: Abnormal   Collection Time: 07/14/18  9:17 PM  Result Value Ref Range   Glucose-Capillary 212 (H) 70 - 99 mg/dL  APTT     Status: None   Collection Time: 07/15/18  5:14 AM  Result Value Ref Range   aPTT 24 24 - 36 seconds    Comment: Performed at [facility], [place] [address] [place] [address]  CBC     Status: Abnormal   Collection Time: 07/15/18  5:14 AM  Result Value Ref Range   WBC 4.5 4.0 - 10.5 K/uL   RBC 1.89 (L) 4.22 - 5.81 MIL/uL   Hemoglobin 5.7 (LL) 13.0 - 17.0 g/dL     Comment: REPEATED TO VERIFY CCLOT THIS CRITICAL RESULT HAS VERIFIED AND BEEN CALLED TO P FINSKY RN BY TIFFANY SHORT ON 11 02 2019 AT 0603, AND HAS BEEN READ BACK. CRITICAL RESULTS VERIFIED    HCT 18.6 (L) 39.0 - 52.0 %   MCV 98.4 80.0 - 100.0 fL   MCH 30.2 26.0 - 34.0 pg   MCHC 30.6 30.0 - 36.0 g/dL   RDW 19.8 (H) 11.5 - 15.5 %   Platelets 37 (L) 150 - 400 K/uL    Comment: REPEATED TO VERIFY Immature Platelet Fraction may be clinically indicated, consider ordering this additional test [id number] CONSISTENT WITH PREVIOUS RESULT    nRBC 0.7 (H) 0.0 - 0.2 %    Comment: Performed at [facility] [address], [place], [place] [address]  Fibrinogen     Status: Abnormal   Collection Time: 07/15/18  5:14 AM  Result Value Ref Range   Fibrinogen 159 (L) 210 - 475 mg/dL    Comment: Performed at [facility] [address] [place], [place] [address]  Comprehensive metabolic panel     Status: None   Collection Time: 07/15/18  5:14 AM  Result Value Ref Range   Sodium  135 - 145 mmol/L    QUESTIONABLE RESULTS, RECOMMEND RECOLLECT TO VERIFY    Comment: B.GRAVES RN SEE ACC [id number] CORRECTED ON 11/02 AT 1335: PREVIOUSLY REPORTED AS 142    Potassium  3.5 - 5.1 mmol/L    QUESTIONABLE RESULTS, RECOMMEND RECOLLECT TO VERIFY    Comment: B.GRAVES RN SEE ACC [id number] CORRECTED ON 11/02 AT 1335: PREVIOUSLY REPORTED AS 2.8    Chloride  98 - 111 mmol/L    QUESTIONABLE RESULTS, RECOMMEND RECOLLECT TO VERIFY    Comment: B.GRAVES RN SEE ACC [id number] CORRECTED ON 11/02 AT 1335: PREVIOUSLY REPORTED AS 121    CO2  22 - 32 mmol/L    QUESTIONABLE RESULTS, RECOMMEND RECOLLECT TO VERIFY    Comment: B.GRAVES RN SEE ACC [id number] CORRECTED ON 11/02 AT 0814: PREVIOUSLY REPORTED AS  19    Glucose, Bld  70 - 99 mg/dL    QUESTIONABLE RESULTS, RECOMMEND RECOLLECT TO VERIFY    Comment: B.GRAVES RN SEE ACC [id number] CORRECTED ON 11/02 AT 1335: PREVIOUSLY REPORTED AS 163    BUN  6 - 20 mg/dL    QUESTIONABLE  RESULTS, RECOMMEND RECOLLECT TO VERIFY    Comment: B.GRAVES RN SEE ACC [id number] CORRECTED ON 11/02 AT 1335: PREVIOUSLY REPORTED AS 22    Creatinine, Ser  0.61 - 1.24 mg/dL    QUESTIONABLE RESULTS, RECOMMEND RECOLLECT TO VERIFY    Comment: B.GRAVES RN SEE ACC [id number] CORRECTED ON 11/02 AT 1335: PREVIOUSLY REPORTED AS 0.43    Calcium  8.9 - 10.3 mg/dL    QUESTIONABLE RESULTS, RECOMMEND RECOLLECT TO VERIFY    Comment: B.GRAVES RN SEE ACC [id number] CORRECTED ON 11/02 AT 1335: PREVIOUSLY REPORTED AS 6.0 CRITICAL RESULT CALLED TO, READ BACK BY AND VERIFIED WITH: B.GRAVES RN @ [phone] 07/15/18 BY C.EDENS    Total Protein  6.5 - 8.1 g/dL    QUESTIONABLE RESULTS, RECOMMEND RECOLLECT TO VERIFY    Comment: B.GRAVES RN SEE ACC [id number] CORRECTED ON 11/02 AT 1335: PREVIOUSLY REPORTED AS 3.1    Albumin  3.5 - 5.0 g/dL    QUESTIONABLE RESULTS, RECOMMEND RECOLLECT TO VERIFY    Comment: B.GRAVES RN SEE ACC [id number] CORRECTED ON 11/02 AT 1335: PREVIOUSLY REPORTED AS 1.7    AST  15 - 41 U/L    QUESTIONABLE RESULTS, RECOMMEND RECOLLECT TO VERIFY    Comment: B.GRAVES RN SEE ACC [id number] CORRECTED ON 11/02 AT 1335: PREVIOUSLY REPORTED AS 11    ALT  0 - 44 U/L    QUESTIONABLE RESULTS, RECOMMEND RECOLLECT TO VERIFY    Comment: B.GRAVES RN SEE ACC [id number] CORRECTED ON 11/02 AT 1335: PREVIOUSLY REPORTED AS 6    Alkaline Phosphatase  38 - 126 U/L    QUESTIONABLE RESULTS, RECOMMEND RECOLLECT TO VERIFY    Comment: B.GRAVES RN SEE ACC [id number] CORRECTED ON 11/02 AT 1335: PREVIOUSLY REPORTED AS 35    Total Bilirubin  0.3 - 1.2 mg/dL    QUESTIONABLE RESULTS, RECOMMEND RECOLLECT TO VERIFY    Comment: B.GRAVES RN SEE ACC [id number] CORRECTED ON 11/02 AT 1335: PREVIOUSLY REPORTED AS 0.4    GFR calc non Af Amer  >60 mL/min    QUESTIONABLE RESULTS, RECOMMEND RECOLLECT TO VERIFY    Comment: B.GRAVES RN SEE ACC [id number] CORRECTED ON 11/02 AT 1335: PREVIOUSLY REPORTED AS >60    GFR calc Af Amer  >60 mL/min    QUESTIONABLE RESULTS,  RECOMMEND RECOLLECT TO VERIFY    Comment: B.GRAVES RN SEE ACC [id number] CORRECTED ON 11/02 AT 1335: PREVIOUSLY REPORTED AS >60    Anion gap  5 - 15    QUESTIONABLE RESULTS, RECOMMEND RECOLLECT TO VERIFY    Comment: B.GRAVES RN SEE [place] [id number] Performed at [facility], [place] [address], [place], [place] [address] CORRECTED ON 11/02 AT 1335: PREVIOUSLY REPORTED AS 2 REPEATED TO VERIFY   Prepare RBC     Status: None   Collection Time: 07/15/18  6:25 AM  Result Value Ref Range   Order Confirmation      ORDER PROCESSED BY BLOOD BANK Performed at [facility], [place] [address] [place], [place] [address]   Glucose, capillary     Status: Abnormal   Collection Time: 07/15/18  7:38 AM  Result Value Ref Range   Glucose-Capillary 197 (H) 70 - 99 mg/dL  Prepare RBC  Status: None   Collection Time: 07/15/18  7:48 AM  Result Value Ref Range   Order Confirmation      ORDER PROCESSED BY BLOOD BANK Performed at [facility], [place] [address] [place], [place] [address]   Prepare Pheresed Platelets     Status: None (Preliminary result)   Collection Time: 07/15/18  7:48 AM  Result Value Ref Range   Unit Number [id number]    Blood Component Type PLTP LR2 PAS    Unit division 00    Status of Unit ISSUED    Transfusion Status      OK TO TRANSFUSE Performed at [facility] [address], [place], [place] [address]    Unit Number [id number]    Blood Component Type PLTP LR1 PAS    Unit division 00    Status of Unit ISSUED    Transfusion Status OK TO TRANSFUSE   Prepare cryoprecipitate     Status: None (Preliminary result)   Collection Time: 07/15/18 10:50 AM  Result Value Ref Range   Unit Number [id number]    Blood Component Type CRYPOOL THAW    Unit division 00    Status of Unit ALLOCATED    Transfusion Status OK TO TRANSFUSE    Unit Number [id number]    Blood Component Type CRYPOOL THAW    Unit division 00    Status of Unit ALLOCATED    Transfusion  Status OK TO TRANSFUSE   CBC     Status: Abnormal   Collection Time: 07/15/18 10:59 AM  Result Value Ref Range   WBC 7.4 4.0 - 10.5 K/uL   RBC 3.53 (L) 4.22 - 5.81 MIL/uL   Hemoglobin 10.7 (L) 13.0 - 17.0 g/dL    Comment: REPEATED TO VERIFY POST TRANSFUSION SPECIMEN    HCT 33.4 (L) 39.0 - 52.0 %   MCV 94.6 80.0 - 100.0 fL   MCH 30.3 26.0 - 34.0 pg   MCHC 32.0 30.0 - 36.0 g/dL   RDW 18.8 (H) 11.5 - 15.5 %   Platelets 58 (L) 150 - 400 K/uL    Comment: REPEATED TO VERIFY SPECIMEN CHECKED FOR CLOTS Immature Platelet Fraction may be clinically indicated, consider ordering this additional test [id number] CONSISTENT WITH PREVIOUS RESULT    nRBC 0.7 (H) 0.0 - 0.2 %    Comment: Performed at [facility], [address] [place], [place] [address]  Comprehensive metabolic panel     Status: Abnormal   Collection Time: 07/15/18 10:59 AM  Result Value Ref Range   Sodium 140 135 - 145 mmol/L   Potassium 4.0 3.5 - 5.1 mmol/L   Chloride 107 98 - 111 mmol/L   CO2 27 22 - 32 mmol/L   Glucose, Bld 279 (H) 70 - 99 mg/dL   BUN 32 (H) 6 - 20 mg/dL   Creatinine, Ser 0.76 0.61 - 1.24 mg/dL   Calcium 9.1 8.9 - 10.3 mg/dL   Total Protein 5.3 (L) 6.5 - 8.1 g/dL   Albumin 2.8 (L) 3.5 - 5.0 g/dL   AST 19 15 - 41 U/L   ALT 6 0 - 44 U/L   Alkaline Phosphatase 59 38 - 126 U/L   Total Bilirubin 0.7 0.3 - 1.2 mg/dL   GFR calc non Af Amer >60 >60 mL/min   GFR calc Af Amer >60 >60 mL/min    Comment: (NOTE) The eGFR has been calculated using the CKD EPI equation. This calculation has not been validated in all  clinical situations. eGFR's persistently <60 mL/min signify possible Chronic Kidney Disease.    Anion gap 6 5 - 15    Comment: Performed at [facility] [address] [place], [place] [address]  Reticulocytes     Status: Abnormal   Collection Time: 07/15/18 10:59 AM  Result Value Ref Range   Retic Ct Pct 2.8 0.4 - 3.1 %   RBC. 3.53 (L) 4.22 - 5.81 MIL/uL   Retic Count, Absolute  99.5 19.0 - 186.0 K/uL   Immature Retic Fract 16.7 (H) 2.3 - 15.9 %    Comment: Performed at [facility] [address], [place],  [address]  Glucose, capillary     Status: Abnormal   Collection Time: 07/15/18 12:32 PM  Result Value Ref Range   Glucose-Capillary 211 (H) 70 - 99 mg/dL    Current Facility-Administered Medications  Medication Dose Route Frequency Provider Last Rate Last Dose  . 0.9 %  sodium chloride infusion (Manually program via Guardrails IV Fluids)   Intravenous Once Kary Kos, MD   Stopped at 07/07/18 1600  . 0.9 %  sodium chloride infusion (Manually program via Guardrails IV Fluids)   Intravenous Once Meyran, Ocie Cornfield, NP   Stopped at 07/14/18 0700  . 0.9 %  sodium chloride infusion (Manually program via Guardrails IV Fluids)   Intravenous Once Bodenheimer, Charles A, NP      . 0.9 %  sodium chloride infusion (Manually program via Guardrails IV Fluids)   Intravenous Once Volanda Napoleon, MD      . 0.9 %  sodium chloride infusion (Manually program via Guardrails IV Fluids)   Intravenous Once Volanda Napoleon, MD      . acetaminophen (TYLENOL) tablet 650 mg  650 mg Oral Q4H PRN Cristal Ford, DO   650 mg at 06/26/18 2204   Or  . acetaminophen (TYLENOL) suppository 650 mg  650 mg Rectal Q4H PRN Cristal Ford, DO      . ALPRAZolam Duanne Moron) tablet 1 mg  1 mg Oral BID PRN Cristal Ford, DO   1 mg at 07/14/18 0000  . alum & mag hydroxide-simeth (MAALOX/MYLANTA) 200-200-20 MG/5ML suspension 30 mL  30 mL Oral Q6H PRN Cristal Ford, DO   30 mL at 06/26/18 2203  . benzonatate (TESSALON) capsule 100 mg  100 mg Oral TID PRN Cristal Ford, DO   100 mg at 07/02/18 3559  . bisacodyl (DULCOLAX) EC tablet 5 mg  5 mg Oral Daily PRN Cristal Ford, DO   5 mg at 07/02/18 2120  . dexamethasone (DECADRON) injection 8 mg  8 mg Intravenous Q12H Volanda Napoleon, MD      . docusate sodium (COLACE) capsule 100 mg  100 mg Oral Daily Cristal Ford, DO   100  mg at 07/08/18 0853  . famotidine (PEPCID) 40 mg in sodium chloride 0.9 % 50 mL IVPB  40 mg Intravenous Q12H Volanda Napoleon, MD      . furosemide (LASIX) injection 20 mg  20 mg Intravenous Once Volanda Napoleon, MD      . gabapentin (NEURONTIN) capsule 300 mg  300 mg Oral TID Cristal Ford, DO   300 mg at 07/15/18 7416  . Gerhardt's butt cream   Topical BID Kary Kos, MD      . HYDROcodone-homatropine [facility]) 5-1.5 MG/5ML syrup 5 mL  5 mL Oral Q4H PRN Cristal Ford, DO   5 mL at 07/14/18 1743  . Influenza vac split quadrivalent PF (FLUARIX)  injection 0.5 mL  0.5 mL Intramuscular Tomorrow-1000 Mikhail, [place], DO      . insulin aspart (novoLOG) injection 0-5 Units  0-5 Units Subcutaneous QHS Cristal Ford, DO   2 Units at 07/14/18 2138  . insulin aspart (novoLOG) injection 0-9 Units  0-9 Units Subcutaneous TID WC Mikhail, Velta Addison, DO   3 Units at 07/15/18 1327  . insulin glargine (LANTUS) injection 30 Units  30 Units Subcutaneous QHS Bonnielee Haff, MD   30 Units at 07/14/18 2138  . lactated ringers infusion   Intravenous Continuous Catalina Gravel, MD 10 mL/hr at 07/10/18 0900    . menthol-cetylpyridinium (CEPACOL) lozenge 3 mg  1 lozenge Oral PRN Cristal Ford, DO       Or  . phenol (CHLORASEPTIC) mouth spray 1 spray  1 spray Mouth/Throat PRN Cristal Ford, DO   1 spray at 06/30/18 0132  . methocarbamol (ROBAXIN) tablet 750 mg  750 mg Oral QID Cristal Ford, DO   750 mg at 07/15/18 1326  . [START ON 07/16/2018] midodrine (PROAMATINE) tablet 5 mg  5 mg Oral TID WC Bonnielee Haff, MD      . morphine 2 MG/ML injection 2 mg  2 mg Intravenous Q4H PRN Cristal Ford, DO   2 mg at 07/10/18 1845  . nystatin (MYCOSTATIN) 100000 UNIT/ML suspension 500,000 Units  5 mL Oral TID AC & HS Cristal Ford, DO   500,000 Units at 07/15/18 1326  . ondansetron (ZOFRAN) tablet 4 mg  4 mg Oral Q6H PRN Cristal Ford, DO       Or  . ondansetron [facility]) injection 4 mg  4 mg  Intravenous Q6H PRN Cristal Ford, DO      . oxyCODONE (Oxy IR/ROXICODONE) immediate release tablet 15 mg  15 mg Oral Q3H PRN Cristal Ford, DO   15 mg at 07/14/18 1743  . senna-docusate (Senokot-S) tablet 1 tablet  1 tablet Oral QHS Cristal Ford, DO   1 tablet at 06/29/18 2202  . sodium chloride flush (NS) 0.9 % injection 10-40 mL  10-40 mL Intracatheter PRN Mikhail, Velta Addison, DO      . sodium chloride flush (NS) 0.9 % injection 10-40 mL  10-40 mL Intracatheter Q12H Regalado, Belkys A, MD   10 mL at 07/15/18 0918  . sodium chloride flush (NS) 0.9 % injection 10-40 mL  10-40 mL Intracatheter PRN Regalado, Belkys A, MD      . sodium chloride flush (NS) 0.9 % injection 3 mL  3 mL Intravenous PRN Cristal Ford, DO   3 mL at 07/13/18 1002    Musculoskeletal: Strength & Muscle Tone: decreased Gait & Station: unable to stand Patient leans: N/A  Psychiatric Specialty Exam: Physical Exam  Nursing note and vitals reviewed. Constitutional: He is oriented to person, place, and time. He appears well-developed and well-nourished.  Respiratory: Effort normal.  Neurological: He is alert and oriented to person, place, and time.  Psychiatric: His speech is normal and behavior is normal. Judgment and thought content normal. His mood appears anxious. Cognition and memory are normal. He exhibits a depressed mood.    Review of Systems  Psychiatric/Behavioral: Positive for depression. The patient is nervous/anxious.   All other systems reviewed and are negative.   Blood pressure 125/70, pulse (!) 41, temperature 97.8 F (36.6 C), temperature source Oral, resp. rate 20, height _0  (1.753 m), weight 123.8 kg, SpO2 100 %.Body mass index is 40.3 kg/m.  General Appearance: Casual  Eye Contact:  Good  Speech:  Normal Rate  Volume:  Decreased  Mood:  Anxious and Depressed  Affect:  Congruent  Thought Process:  Coherent and Descriptions of Associations: Intact  Orientation:  Full (Time, Place,  and Person)  Thought Content:  Rumination  Suicidal Thoughts:  No  Homicidal Thoughts:  No  Memory:  Immediate;   Good Recent;   Good Remote;   Good  Judgement:  Good  Insight:  Good  Psychomotor Activity:  Decreased  Concentration:  Concentration: Good and Attention Span: Good  Recall:  Good  Fund of Knowledge:  Good  Language:  Good  Akathisia:  No  Handed:  Right  AIMS (if indicated):     Assets:  Housing Leisure Time Social Support  ADL's:  Impaired  Cognition:  WNL  Sleep:        Treatment Plan Summary: Adjustment disorder with mixed disturbance of depression and anxiety: -Change Xanax 1 mg BID PRN anxiety to Klonopin 0.5 mg BID since its longer acting -Start sertraline 25 mg daily if not contraindicated with medical issues  Disposition: Supportive therapy provided about ongoing stressors.  [name], NP 07/15/2018 4:18 PM

## 2018-07-16 LAB — PREPARE CRYOPRECIPITATE
Unit division: 0
Unit division: 0

## 2018-07-16 LAB — GLUCOSE, CAPILLARY
GLUCOSE-CAPILLARY: 248 mg/dL — AB (ref 70–99)
GLUCOSE-CAPILLARY: 265 mg/dL — AB (ref 70–99)
Glucose-Capillary: 327 mg/dL — ABNORMAL HIGH (ref 70–99)
Glucose-Capillary: 251 mg/dL — ABNORMAL HIGH (ref 70–99)

## 2018-07-16 LAB — BPAM CRYOPRECIPITATE
BLOOD PRODUCT EXPIRATION DATE: 201911021826
Blood Product Expiration Date: 201911021826
ISSUE DATE / TIME: 201911021655
ISSUE DATE / TIME: 201911021655
UNIT TYPE AND RH: 5100
Unit Type and Rh: 5100

## 2018-07-16 LAB — PREPARE PLATELET PHERESIS
Unit division: 0
Unit division: 0

## 2018-07-16 LAB — COMPREHENSIVE METABOLIC PANEL
ALT: 6 U/L (ref 0–44)
AST: 18 U/L (ref 15–41)
Albumin: 2.6 g/dL — ABNORMAL LOW (ref 3.5–5.0)
Alkaline Phosphatase: 77 U/L (ref 38–126)
Anion gap: 7 (ref 5–15)
BUN: 33 mg/dL — ABNORMAL HIGH (ref 6–20)
CHLORIDE: 105 mmol/L (ref 98–111)
CO2: 25 mmol/L (ref 22–32)
Calcium: 8.7 mg/dL — ABNORMAL LOW (ref 8.9–10.3)
Creatinine, Ser: 0.7 mg/dL (ref 0.61–1.24)
Glucose, Bld: 284 mg/dL — ABNORMAL HIGH (ref 70–99)
Potassium: 4.2 mmol/L (ref 3.5–5.1)
Sodium: 137 mmol/L (ref 135–145)
Total Bilirubin: 0.6 mg/dL (ref 0.3–1.2)
Total Protein: 4.9 g/dL — ABNORMAL LOW (ref 6.5–8.1)

## 2018-07-16 LAB — CBC WITH DIFFERENTIAL/PLATELET
ABS IMMATURE GRANULOCYTES: 0.11 10*3/uL — AB (ref 0.00–0.07)
BASOS PCT: 0 %
Basophils Absolute: 0 10*3/uL (ref 0.0–0.1)
EOS ABS: 0 10*3/uL (ref 0.0–0.5)
Eosinophils Relative: 0 %
HEMATOCRIT: 32.1 % — AB (ref 39.0–52.0)
Hemoglobin: 10.2 g/dL — ABNORMAL LOW (ref 13.0–17.0)
Immature Granulocytes: 2 %
Lymphocytes Relative: 6 %
Lymphs Abs: 0.4 10*3/uL — ABNORMAL LOW (ref 0.7–4.0)
MCH: 30.4 pg (ref 26.0–34.0)
MCHC: 31.8 g/dL (ref 30.0–36.0)
MCV: 95.5 fL (ref 80.0–100.0)
MONO ABS: 0.2 10*3/uL (ref 0.1–1.0)
MONOS PCT: 4 %
NEUTROS ABS: 5.5 10*3/uL (ref 1.7–7.7)
Neutrophils Relative %: 88 %
PLATELETS: 66 10*3/uL — AB (ref 150–400)
RBC: 3.36 MIL/uL — ABNORMAL LOW (ref 4.22–5.81)
RDW: 18.6 % — ABNORMAL HIGH (ref 11.5–15.5)
WBC: 6.2 10*3/uL (ref 4.0–10.5)
nRBC: 0.5 % — ABNORMAL HIGH (ref 0.0–0.2)

## 2018-07-16 LAB — APTT: APTT: 24 s (ref 24–36)

## 2018-07-16 LAB — BPAM PLATELET PHERESIS
Blood Product Expiration Date: 201911032359
Blood Product Expiration Date: 201911042359
ISSUE DATE / TIME: 201911021237
ISSUE DATE / TIME: 201911021452
Unit Type and Rh: 6200
Unit Type and Rh: 7300

## 2018-07-16 LAB — LACTATE DEHYDROGENASE: LDH: 341 U/L — AB (ref 98–192)

## 2018-07-16 LAB — HEMOGLOBIN A1C
HEMOGLOBIN A1C: 5.8 % — AB (ref 4.8–5.6)
MEAN PLASMA GLUCOSE: 119.76 mg/dL

## 2018-07-16 LAB — VITAMIN B12: Vitamin B-12: 623 pg/mL (ref 180–914)

## 2018-07-16 MED ORDER — FUROSEMIDE 10 MG/ML IJ SOLN
20.0000 mg | Freq: Once | INTRAMUSCULAR | Status: AC
  Administered 2018-07-16: 20 mg via INTRAVENOUS
  Filled 2018-07-16: qty 2

## 2018-07-16 MED ORDER — SODIUM CHLORIDE 0.9 % IV SOLN
INTRAVENOUS | Status: DC | PRN
  Administered 2018-08-06: 500 mL via INTRAVENOUS
  Administered 2018-08-09: 250 mL via INTRAVENOUS
  Administered 2018-08-11: 10 mL via INTRAVENOUS

## 2018-07-16 MED ORDER — MIDODRINE HCL 5 MG PO TABS
5.0000 mg | ORAL_TABLET | Freq: Two times a day (BID) | ORAL | Status: DC
  Administered 2018-07-17 – 2018-08-15 (×52): 5 mg via ORAL
  Filled 2018-07-16 (×53): qty 1

## 2018-07-16 NOTE — Progress Notes (Addendum)
PROGRESS NOTE    [name] [name]  SEG:[id number] DOB: 1965/10/02 DOA: 06/06/2018 PCP: Helane Rima, MD    Brief Narrative: Brief Narrative:  52 year old man with past medical history relevant for gout, hypertension anterior cervical corpectomy of C4 on 03/24/2018 with comp gated postoperative course including development of multiple PEs and DVTs started on anticoagulation and subsequently developed hematoma requiring reexploration on 03/28/2018 status post placement of IVC filter who was discharged to skilled nursing facility and had a complicated course including progressive lower extremity weakness, intermittent fevers of unclear etiology and hospitalizations for "pneumonia". Patient was admitted to this hospital is a transfer from [facility] with MRI of C-spine that showed postoperative changes and cord compression with cord edema and concern for ischemia. Patient is status post posterior cervical decompression laminectomy of C3, C4, C5, removal of spinous processes, foraminotomies on 06/14/2018. On 06/22/2018 patient began to develop worsening left sided paresthesias and weakness and a stat MRI showed evidence of postoperative hematoma. Patient was taken to the OR with evacuation of hematoma. Additionally his postoperative course has been complicated by recurrent fevers,bilateral lower extremity DVTs on ultrasound,migratory arthralgias/arthritis that are thought to be rheumatologic in nature as well as significant orthostasis limiting the amount he can work with physical therapy. His arthralgia has resolved.   Noted to have decreased left hand grip on 10/19, MRI showed large epidural hematoma, patient s/p clot evacuation.  He was place back on anticoagulation argatroban on 10-22. He was notice to be more bradycardic with HR in the 40 on 10-23. Cardiology was consulted, and argatroban was transition to bivalirrubin to avoid bradycardia. His HR improved after argatroban was discontinue.  -HIT panel 0.486 (mildly elevated)- SRA (serotonin release assay) negative. Dr Marin Olp has been helping with patient care. He is recommending to avoid heparin or Lovenox products.   Patient developed weakness of left arm on 10-25. He was take emergently to OR for recurrence of epidural hematoma on 10-25. Anticoagulation was discontinue since 10-25. Dr Saintclair Halsted is recommending for patient to remain off anticoagulation for alt leas 1 week, and if anticoagulation is consider, that will need to be discuss with DR Saintclair Halsted.  Patient the night of 10-28 develops left arm weakness, numbness. Stat MRI was ordered, which confirm recurrent hematoma. Patient was taken to OR for hematoma evacuation 10-28. He has been off anticoagulation since 10-25. Patient will received platelet transfusion 10-29.  For his thrombocytopenia, a Bone marrow biopsy is planned for 10-30. For LE DVT, he has IVC filter. He cant not be on anticoagulation due to recurrent bleeding.     Assessment & Plan:   Principal Problem:   Fever Active Problems:   Myelopathy (HCC)   Benign essential HTN   Cord compression (HCC)   History of pulmonary embolism   Spondylosis, cervical, with myelopathy   Labile blood pressure   Orthostatic hypotension   Leukocytosis   Tachycardia-bradycardia syndrome (HCC)   Adjustment disorder with mixed anxiety and depressed mood   Near syncope due to orthostatic hypotension Thought to be related to hypovolemia and autonomic dysfunction.  Echocardiogram July 2019 showed a normal systolic function, grade 2 diastolic dysfunction.  No significant valve abnormalities noted. Patient was placed on midodrine.  On hold now due to issues with bradycardia.  Abdominal binder in place.    Sinus bradycardia Patient initially developed bradycardia on 10/23.  He was asymptomatic.  Cardiology was consulted at that time.  There was no indication for pacemaker.  His heart rate stabilized and improved.  He  again became  bradycardic on 11/1.  EKG shows sinus bradycardia.  Telemetry does not show any pauses.  Heart rates appears to have improved over the last 12 hours.  Midodrine was held as this medication can sometimes cause bradycardia.  We will continue to hold and perhaps resume at a lower dose from tomorrow.  But he could also have autonomic dysfunction which could also be another reason for his bradycardia.   He is not on any AV nodal blocking agents.    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 Difficult situation with patient recent thrombotic event. -He developed left arm weakness 10-25- and 10-28, he was take to OR for epidural hematoma evacuation.  Neurosurgery continues to follow.  Defer management to them.  He remains on dexamethasone.    Normocytic anemia Low hemoglobin noted on 11/2 was most likely lab error.  Patient was transfused 1 unit of blood.  Hemoglobin is stable this morning.  CT scan did not show any obvious source of bleeding.  Patient has not had any bleeding per se.  No further work-up at this time.  Continue to monitor hemoglobin periodically.    Thrombocytopenia/coagulopathy -Cardiolipin antibody negative -Lupus anticoagulant, homocysteine level, factor VIII within normal limits -Protein S activity 60 (low) -Protein C total >200; Activity 199 (high)  -HIT panel 0.486 (mildly elevated)- SRA (serotonin release assay) negative -immature platelet fraction WNL -Was given 2u platelets on 10/19 in the OR. And 2 units 10-25 for OR. Platelet  transfusion 10/29 Patient transfused platelets and given vitamin K on 10/30. Patient underwent bone marrow biopsy on 10/30.  Pathology report suggests nonspecific findings.  Hematology suspects that some of his coagulopathy and platelet low platelet counts likely due to medications.  Platelet count remains stable but low.    Polyarthralgia/persistent fever -At the time of admission patient had symptoms in the shoulders and ankles, along with a history of gout and pain in his knees.  He underwent arthrocentesis however no evidence of infection was found. -He was noted to have elevated inflammatory markers including CCP, CRP.  However ANCA, aldolase, ANA were negative. -Previous rounding physicians have discussed with Dr. Kathlene November, rheumatologist, he will follow-up with the patient as an outpatient. -Patient was placed on prednisone however currently on dexamethasone (neurosurgery) Fever has resolved.  He was noted to be on doxycycline since 10/18.  Reason is not entirely clear.  Discontinued on 10/31.  Remains afebrile.  Recent PE /DVT in setting of spine surgery -Status post IVC filter placement on March 28, 2018 -Patient was on apixaban and changed over to IV heparin.  Anticoagulation however was discontinued for surgery.  Patient also noted to have thrombocytopenia. -Vascular DVT US 06/26/2018 showed bilateral lower extremity DVTs likely acute -Case was discussed with  vascular surgery, Dr. Trula Slade, patient is out of the 3 week window for treatment, he would have to get TPA, which is contraindicated in a bleeding patient.  -Bivalirudin discontinue by neurosurgery. Serotonin release assay negative.  - Patient has required multiple surgical evacuation of epidural hematoma. Difficult situation with patient recent thrombotic event.  Discussed with Dr Annamaria Boots 10-28, thrombectomy  only will not be enough to take care of B/L thrombus, patient will require anticoagulation, TPA. Patient is too high risk for  bleeding, with recurrent epidural hematoma.  Remains off of anticoagulation due to bleeding episodes.  Will need neurosurgery input before anti-coagulation can be reconsidered. Lasix for upper extremity edema.  Previously done ultrasound has not shown any DVTs.  Gout Stable.  Continue Uloric  Mild acute renal failure Resolved.  Electrolytes and renal function normal this morning.  Essential hypertension  Amlodipine discontinue due to orthostatic hypotension and edema.  Blood pressure remains stable.  Some degree of hypertension likely due to midodrine.  Hyperglycemia; from steroids.  Continue to monitor CBGs.  Hold off on increasing dose of Lantus today.  We will reconsider tomorrow.  HbA1c 5.8.  Hyperglycemia most likely due to steroids.  CT scan raised concern for pneumonia but patient does not have any respiratory symptoms.  He is afebrile.  Normal WBC.  He remains on Ancef.  No need to pursue this further.  Gallstones noted as well.  Communicated to the patient.  DVT prophylaxis: TED's.  No anticoagulants due to frequent epidural bleeding Code Status: full code.  Family Communication: discussed with patient,  Disposition Plan: Patient remains stable for now.  Further management per hematology and neurosurgery.  His clinical condition remains tenuous.    Consultants:   Neurosurgery  Hematology  Cardiology    Procedures: On 10/2 1. posterior cervical decompressive laminectomy at C3, C4, C5 with removal of the spinous process and complete laminectomy and foraminotomies of the C3, C4, C5 nerve roots. 2. Posterior cervical fusion with lateral mass screws at C3-C4-C5 and pars screws at C2 lies in the globus ellipseLateral mass screw system 3. Posterior lateral arthrodesis C2-C5 utilizing locally harvested autograft mixed with vivigen  06/22/2018: Procedure reexploration posterior cervical wound for evacuation of epidural hematoma  06/26/2018 DVT ultrasound:Right:  Findings consistent with acute deep vein thrombosis involving the right common femoral vein, right femoral vein, right popliteal vein, right posterior tibial vein, and right peroneal vein. Thrombosis extends proximally into external iliac vein.  Proximal iliac vein not visualized. Left: Findings consistent with acute deep vein thrombosis involving the left common femoral vein, left femoral vein, left popliteal vein, left posterior tibial vein, and left peroneal vein. Findings consistent with acute superficial vein thrombosis  involving the left great saphenous vein. Thrombosis extends proximally into external iliac vein. Proximal iliac vein not visualized.  07/01/18 Clot evacuation   RUE doppler 07/02/18    Antimicrobials:  Ancef.   Subjective: Patient noted to be in better spirits this morning.  He is complaining of some swelling in his hands.  Requesting diuretics.  His family friend is at the bedside.  Objective: Vitals:   07/15/18 2300 07/16/18 0300 07/16/18 0800 07/16/18 0815  BP: 127/84 125/89    Pulse: 83 90 96   Resp: _0 Temp: 98.7 F (37.1 C) 98.5 F (36.9 C)  98.3 F (36.8 C)  TempSrc: Oral Oral    SpO2: 100% 99% (!) 75%   Weight:      Height:        Intake/Output Summary (Last 24 hours) at 07/16/2018 1227 Last data filed at 07/16/2018 0940 Gross per 24 hour  Intake 1168 ml  Output 2310 ml  Net -1142 ml   Filed Weights   06/14/18 1151 06/22/18 1641 07/07/18 1000  Weight: 123.8 kg 123.8 kg 123.8 kg    Examination:  General exam: Awake alert.  In  no distress Respiratory system: Normal effort at rest.  Clear to auscultation bilaterally Cardiovascular system: S1-S2 is normal regular.  Telemetry shows improvement in his heart rate to 50s and 60s.  Remains sinus.   Gastrointestinal system: Abdomen is soft.  Nontender nondistended.  Bowel sounds present normal. Edema noted bilateral upper extremities.    Data Reviewed: I have personally reviewed  following labs and imaging studies  CBC: Recent Labs  Lab 07/11/18 0605  07/13/18 0600 07/14/18 0000 07/14/18 1312 07/15/18 0514 07/15/18 1059 07/16/18 0559  WBC 5.2   < > 5.7  --  5.9 4.5 7.4 6.2  NEUTROABS 4.7  --   --   --  5.2  --   --  5.5  HGB 8.0*   < > 7.8* 6.9* 8.2* 5.7* 10.7* 10.2*  HCT 25.8*   < > 25.1* 22.3* 25.2* 18.6* 33.4* 32.1*  MCV 98.5   < > 100.0  --  94.7 98.4 94.6 95.5  PLT 62*   < > 66*  --  53* 37* 58* 66*   < > = values in this interval not displayed.   Basic Metabolic Panel: Recent Labs  Lab 07/12/18 0500 07/14/18 1312 07/15/18 0514 07/15/18 1059 07/16/18 0547  NA 140 141 QUESTIONABLE RESULTS, RECOMMEND RECOLLECT TO VERIFY 140 137  K 4.1 3.9 QUESTIONABLE RESULTS, RECOMMEND RECOLLECT TO VERIFY 4.0 4.2  CL 107 111 QUESTIONABLE RESULTS, RECOMMEND RECOLLECT TO VERIFY 107 105  CO2 27 28 QUESTIONABLE RESULTS, RECOMMEND RECOLLECT TO VERIFY 27 25  GLUCOSE 298* 279* QUESTIONABLE RESULTS, RECOMMEND RECOLLECT TO VERIFY 279* 284*  BUN 36* 28* QUESTIONABLE RESULTS, RECOMMEND RECOLLECT TO VERIFY 32* 33*  CREATININE 0.75 0.62 QUESTIONABLE RESULTS, RECOMMEND RECOLLECT TO VERIFY 0.76 0.70  CALCIUM 8.7* 8.2* QUESTIONABLE RESULTS, RECOMMEND RECOLLECT TO VERIFY 9.1 8.7*   GFR: Estimated Creatinine Clearance: 140.4 mL/min (by C-G formula based on SCr of 0.7 mg/dL).  Coagulation Profile: Recent Labs  Lab 07/12/18 0500 07/13/18 0630 07/14/18 1312  INR 1.29 1.31 1.36   CBG: Recent Labs  Lab 07/15/18 0738 07/15/18 1232 07/15/18 1654 07/15/18 2134 07/16/18 0753  GLUCAP 197* 211* 195* 249* 248*      Radiology Studies: Ct Chest W Contrast  Result Date: 07/15/2018 CLINICAL DATA:  GI bleed.  Decreased hematocrit EXAM: CT CHEST, ABDOMEN, AND PELVIS WITH CONTRAST TECHNIQUE: Multidetector CT imaging of the chest, abdomen and pelvis was performed following the standard protocol during bolus administration of intravenous contrast. CONTRAST:  110m ISOVUE-300  IOPAMIDOL (ISOVUE-300) INJECTION 61% COMPARISON:  CT AP 07/12/2018 and CT chest 06/23/2018 FINDINGS: CT CHEST FINDINGS Cardiovascular: The heart size appears within normal limits. No pericardial effusion identified. Mediastinum/Nodes: No enlarged mediastinal, hilar, or axillary lymph nodes. Thyroid gland, trachea, and esophagus demonstrate no significant findings. Lungs/Pleura: There is asymmetric elevation of the left hemidiaphragm. Subsegmental atelectasis within the left lower lobe and right lower lobe identified. Mild patchy airspace densities within the posterior right upper lobe identified. This appears similar to previous exam. Small dense nodule within the lateral left upper lobe measures 3 mm, image 79/4. Musculoskeletal: No chest wall mass or suspicious bone lesions identified. CT ABDOMEN PELVIS FINDINGS Hepatobiliary: Multiple stones identified within the gallbladder measuring up to 2 cm. No focal liver abnormality. No biliary ductal dilatation. Pancreas: Unremarkable. No pancreatic ductal dilatation or surrounding inflammatory changes. Spleen: Normal in size without focal abnormality. Adrenals/Urinary Tract: The adrenal glands appear normal. No kidney mass or hydronephrosis. Urinary bladder appears normal. Stomach/Bowel: Increased density within the gastric lumen is identified. This  is nonspecific particularly since the patient ingested positive oral contrast material. No pathologic dilatation of the colon. No colonic mass identified. Vascular/Lymphatic: Normal appearance of the abdominal aorta. The portal vein and hepatic veins appear patent. There is a filter within the IVC. No adenopathy within the abdomen or pelvis. Reproductive: Prostate is unremarkable. Other: Trace abdominopelvic ascites. Fat containing umbilical hernia. Musculoskeletal: Body wall edema is identified compatible with anasarca. No acute or significant osseous abnormality. IMPRESSION: 1. No hematoma identified. Increased density  within the gastric lumen noted, which is nonspecific in the setting of positive oral contrast material. Correlate for any clinical signs or symptoms of upper GI tract bleed. 2. Persistent patchy airspace densities within the posterior right upper lobe compatible with pneumonia. 3. Bilateral lower lobe subsegmental atelectasis with asymmetric elevation of the left hemidiaphragm. 4. Gallstones. 5. Trace ascites within the abdomen and pelvis. 6. Body wall edema noted. Electronically Signed   By: Kerby Moors M.D.   On: 07/15/2018 19:10   Ct Abdomen Pelvis W Contrast  Result Date: 07/15/2018 CLINICAL DATA:  GI bleed.  Decreased hematocrit EXAM: CT CHEST, ABDOMEN, AND PELVIS WITH CONTRAST TECHNIQUE: Multidetector CT imaging of the chest, abdomen and pelvis was performed following the standard protocol during bolus administration of intravenous contrast. CONTRAST:  123m ISOVUE-300 IOPAMIDOL (ISOVUE-300) INJECTION 61% COMPARISON:  CT AP 07/12/2018 and CT chest 06/23/2018 FINDINGS: CT CHEST FINDINGS Cardiovascular: The heart size appears within normal limits. No pericardial effusion identified. Mediastinum/Nodes: No enlarged mediastinal, hilar, or axillary lymph nodes. Thyroid gland, trachea, and esophagus demonstrate no significant findings. Lungs/Pleura: There is asymmetric elevation of the left hemidiaphragm. Subsegmental atelectasis within the left lower lobe and right lower lobe identified. Mild patchy airspace densities within the posterior right upper lobe identified. This appears similar to previous exam. Small dense nodule within the lateral left upper lobe measures 3 mm, image 79/4. Musculoskeletal: No chest wall mass or suspicious bone lesions identified. CT ABDOMEN PELVIS FINDINGS Hepatobiliary: Multiple stones identified within the gallbladder measuring up to 2 cm. No focal liver abnormality. No biliary ductal dilatation. Pancreas: Unremarkable. No pancreatic ductal dilatation or surrounding inflammatory  changes. Spleen: Normal in size without focal abnormality. Adrenals/Urinary Tract: The adrenal glands appear normal. No kidney mass or hydronephrosis. Urinary bladder appears normal. Stomach/Bowel: Increased density within the gastric lumen is identified. This is nonspecific particularly since the patient ingested positive oral contrast material. No pathologic dilatation of the colon. No colonic mass identified. Vascular/Lymphatic: Normal appearance of the abdominal aorta. The portal vein and hepatic veins appear patent. There is a filter within the IVC. No adenopathy within the abdomen or pelvis. Reproductive: Prostate is unremarkable. Other: Trace abdominopelvic ascites. Fat containing umbilical hernia. Musculoskeletal: Body wall edema is identified compatible with anasarca. No acute or significant osseous abnormality. IMPRESSION: 1. No hematoma identified. Increased density within the gastric lumen noted, which is nonspecific in the setting of positive oral contrast material. Correlate for any clinical signs or symptoms of upper GI tract bleed. 2. Persistent patchy airspace densities within the posterior right upper lobe compatible with pneumonia. 3. Bilateral lower lobe subsegmental atelectasis with asymmetric elevation of the left hemidiaphragm. 4. Gallstones. 5. Trace ascites within the abdomen and pelvis. 6. Body wall edema noted. Electronically Signed   By: TKerby MoorsM.D.   On: 07/15/2018 19:10        Scheduled Meds: . sodium chloride   Intravenous Once  . sodium chloride   Intravenous Once  . sodium chloride   Intravenous Once  .  sodium chloride   Intravenous Once  . dexamethasone  8 mg Intravenous Q12H  . docusate sodium  100 mg Oral Daily  . gabapentin  300 mg Oral TID  . Gerhardt's butt cream   Topical BID  . Influenza vac split quadrivalent PF  0.5 mL Intramuscular Tomorrow-1000  . insulin aspart  0-5 Units Subcutaneous QHS  . insulin aspart  0-9 Units Subcutaneous TID WC  .  insulin glargine  30 Units Subcutaneous QHS  . methocarbamol  750 mg Oral QID  . midodrine  5 mg Oral TID WC  . nystatin  5 mL Oral TID AC & HS  . senna-docusate  1 tablet Oral QHS  . sodium chloride flush  10-40 mL Intracatheter Q12H   Continuous Infusions: . famotidine (PEPCID) IV    . lactated ringers 10 mL/hr at 07/10/18 0900     LOS: 39 days     Bonnielee Haff, MD [facility] Pager [phone]  If 7PM-7AM, please contact night-coverage [email or web address] Password [facility] 07/16/2018, 12:27 PM

## 2018-07-16 NOTE — Progress Notes (Signed)
Pharmacy Antibiotic Note  [name] [name] is a 52 y.o. male. Pharmacy has been consulted for Cefazolin dosing while neck drain in place. Renal function is stable and dose remains appropriate. Pt is afebrile and WBC is WNL. Drain remains in place.   Plan: Continue cefazolin 2 gm IV q8hrs F/u discontinuation of abx when drain removed  Height: 5\' 9"  (175.3 cm) Weight: 272 lb 14.9 oz (123.8 kg) IBW/kg (Calculated) : 70.7  Temp (24hrs), Avg:98 F (36.7 C), Min:97.5 F (36.4 C), Max:98.7 F (37.1 C)  Recent Labs  Lab 07/12/18 0500 07/13/18 0600 07/14/18 1312 07/15/18 0514 07/15/18 1059 07/16/18 0547  WBC 5.8 5.7 5.9 4.5 7.4  --   CREATININE 0.75  --  0.62 QUESTIONABLE RESULTS, RECOMMEND RECOLLECT TO VERIFY 0.76 0.70    Estimated Creatinine Clearance: 140.4 mL/min (by C-G formula based on SCr of 0.7 mg/dL).    Allergies  Allergen Reactions  . Nsaids Other (See Comments)    G6PD DEFICIENCY  . Sulfa Antibiotics Other (See Comments)    G6PD DEFICIENCY  . Protonix [Pantoprazole Sodium] Diarrhea    Severe diarrhea, Upset stomach    Antimicrobials this admission: Fluconazole 10/15 >> 10/22 Nystatin 10/15 >> Doxycycline PO 10/18>>10/30 Cefazolin 10/24>>  Dose adjustments this admission:  n/a  Microbiology results: 9/25 Urine - <10 K/ml, insignificant growth 9/25 Blood x 2  - negative 9/26 Blood x 2 - negative 9/27 right knee synovial fluid - negative 10/2 MRSA PCR - negative 10/2 surgical MRSA PCR: postive for Staph but negative for MRSA 10/2 MRSA PCR - negative  Ewing Schlein, PharmD PGY1 Pharmacy Resident 07/16/2018    10:12 AM  Please see AMION for all pharmacy numbers

## 2018-07-16 NOTE — Progress Notes (Signed)
  NEUROSURGERY PROGRESS NOTE   Required RBC and platelet transfusion yesterday No change in motor function  EXAM:  BP 125/89 (BP Location: Right Arm)   Pulse 90   Temp 98.3 F (36.8 C)   Resp 15   Ht 5\' 9"  (1.753 m)   Wt 123.8 kg   SpO2 99%   BMI 40.30 kg/m   Awake, alert, oriented  In good spirits Speech fluent, appropriate  RUE/RLE: 3-4-/5 LUE/LLE: 0-1/5 Drain still in place. Minimal output  PLAN Stable neuro exam Continue drain for now No new NS recs

## 2018-07-16 NOTE — Progress Notes (Signed)
Met with patient today with literature explaining his medical issues along with an illustration. [name] [name] was sitting up in the bed and conversing with family and smiling.  He reported he did get more sleep and his affect was bright.    [name], PMHNP

## 2018-07-17 LAB — CBC WITH DIFFERENTIAL/PLATELET
ABS IMMATURE GRANULOCYTES: 0.08 10*3/uL — AB (ref 0.00–0.07)
BASOS ABS: 0 10*3/uL (ref 0.0–0.1)
BASOS PCT: 0 %
EOS ABS: 0 10*3/uL (ref 0.0–0.5)
Eosinophils Relative: 0 %
HCT: 31.8 % — ABNORMAL LOW (ref 39.0–52.0)
Hemoglobin: 10.1 g/dL — ABNORMAL LOW (ref 13.0–17.0)
IMMATURE GRANULOCYTES: 1 %
Lymphocytes Relative: 6 %
Lymphs Abs: 0.4 10*3/uL — ABNORMAL LOW (ref 0.7–4.0)
MCH: 30.6 pg (ref 26.0–34.0)
MCHC: 31.8 g/dL (ref 30.0–36.0)
MCV: 96.4 fL (ref 80.0–100.0)
MONOS PCT: 4 %
Monocytes Absolute: 0.3 10*3/uL (ref 0.1–1.0)
NEUTROS ABS: 5.4 10*3/uL (ref 1.7–7.7)
NEUTROS PCT: 89 %
NRBC: 0.3 % — AB (ref 0.0–0.2)
Platelets: 53 10*3/uL — ABNORMAL LOW (ref 150–400)
RBC: 3.3 MIL/uL — ABNORMAL LOW (ref 4.22–5.81)
RDW: 18.1 % — AB (ref 11.5–15.5)
WBC: 6.1 10*3/uL (ref 4.0–10.5)

## 2018-07-17 LAB — COMPREHENSIVE METABOLIC PANEL
ALT: 6 U/L (ref 0–44)
ANION GAP: 3 — AB (ref 5–15)
AST: 17 U/L (ref 15–41)
Albumin: 2.5 g/dL — ABNORMAL LOW (ref 3.5–5.0)
Alkaline Phosphatase: 69 U/L (ref 38–126)
BILIRUBIN TOTAL: 0.5 mg/dL (ref 0.3–1.2)
BUN: 30 mg/dL — AB (ref 6–20)
CHLORIDE: 107 mmol/L (ref 98–111)
CO2: 29 mmol/L (ref 22–32)
Calcium: 8.8 mg/dL — ABNORMAL LOW (ref 8.9–10.3)
Creatinine, Ser: 0.66 mg/dL (ref 0.61–1.24)
Glucose, Bld: 197 mg/dL — ABNORMAL HIGH (ref 70–99)
POTASSIUM: 4.2 mmol/L (ref 3.5–5.1)
Sodium: 139 mmol/L (ref 135–145)
TOTAL PROTEIN: 4.8 g/dL — AB (ref 6.5–8.1)

## 2018-07-17 LAB — GLUCOSE, CAPILLARY
GLUCOSE-CAPILLARY: 188 mg/dL — AB (ref 70–99)
Glucose-Capillary: 161 mg/dL — ABNORMAL HIGH (ref 70–99)
Glucose-Capillary: 137 mg/dL — ABNORMAL HIGH (ref 70–99)
Glucose-Capillary: 192 mg/dL — ABNORMAL HIGH (ref 70–99)

## 2018-07-17 LAB — ERYTHROPOIETIN: ERYTHROPOIETIN: 20.8 m[IU]/mL — AB (ref 2.6–18.5)

## 2018-07-17 LAB — LACTATE DEHYDROGENASE: LDH: 322 U/L — AB (ref 98–192)

## 2018-07-17 LAB — APTT: APTT: 23 s — AB (ref 24–36)

## 2018-07-17 MED ORDER — FUROSEMIDE 10 MG/ML IJ SOLN
20.0000 mg | Freq: Once | INTRAMUSCULAR | Status: AC
  Administered 2018-07-17: 20 mg via INTRAVENOUS
  Filled 2018-07-17: qty 2

## 2018-07-17 MED ORDER — MAGIC MOUTHWASH W/LIDOCAINE
5.0000 mL | Freq: Three times a day (TID) | ORAL | Status: DC
  Administered 2018-07-17 – 2018-09-10 (×122): 5 mL via ORAL
  Filled 2018-07-17 (×170): qty 5

## 2018-07-17 MED ORDER — CLONAZEPAM 0.5 MG PO TABS
0.5000 mg | ORAL_TABLET | Freq: Two times a day (BID) | ORAL | Status: DC
  Administered 2018-07-17 – 2018-09-10 (×108): 0.5 mg via ORAL
  Filled 2018-07-17 (×109): qty 1

## 2018-07-17 MED ORDER — DEXAMETHASONE SODIUM PHOSPHATE 10 MG/ML IJ SOLN
10.0000 mg | Freq: Four times a day (QID) | INTRAMUSCULAR | Status: DC
  Administered 2018-07-17 – 2018-07-19 (×7): 10 mg via INTRAVENOUS
  Filled 2018-07-17 (×7): qty 1

## 2018-07-17 NOTE — Progress Notes (Addendum)
Occupational Therapy Treatment Patient Details Name: [name] [name] MRN: [id number] DOB: 1966-01-28 Today's Date: 07/17/2018    History of present illness 52 y.o. male admitted on 06/06/18 for bil LE wekness and muscle spasm after multiple falls (with resultant R knee meniscus tear placed in bledsoe brace) following a recent ACDF at [facility] in July 2019 (complicated by PE (s/p IVC filter), CIR, and then SNF placement before finally returning home with his parents).  In the ED MRI of c-spine which showed post operative changes and cord edema and possibility of ischemia.  His R knee was aspirated on 06/09/18.  S/p posterior cervical decompression and fusion on 06/14/18. Entire acute course complicated by hypotension in standing (thought to be autonomic) and by finding of multiple LE DVTs (already has an IVC filter in place, but per MDs notes at risk for blocking the filter with these clots).  Pt with post op issues with incisional bleeding s/p 2 evacuations of hematomas and placement of hemovac (06/22/18 and 07/01/18) as well as multiple units of plasma.  Cardiology consulted 07/05/18 due to bradycardia thought to be from his newly started blood thinner.  Pt with another re-accumulation of epidural hematoma with increased L sided weakness and cord compression with emergent evacuation surgery 10/25 & 07/10/18.  Pt also needing more platelets post op.  Bone marrow bx 07/12/18. PMHx of HTN, gout, anemia, DDD (lumbar)   OT comments  Pt progressing towards established OT goals. Pt presenting with increased edema in BUE and decreased strength and ROM (Left weaker than right). Pt engaged in exercises during PROM for LUE and AAROM for RUE. Pt was educated on using adaptive equipment to self-feed while seated in recliner. Provided built up handles and self feeding hand cuffs and pt performed self-feeding with built-up handle on spoon with set up assist; pt preferred built up handles. Continue to recommend DC to SNF. Will  continue following acutely as admitted.   Follow Up Recommendations  SNF;Supervision/Assistance - 24 hour    Equipment Recommendations  Other (comment)    Recommendations for Other Services Rehab consult    Precautions / Restrictions Precautions Precautions: Fall;Cervical Precaution Comments: hemovac Required Braces or Orthoses: Other Brace/Splint;Cervical Brace Knee Immobilizer - Right: On when out of bed or walking Cervical Brace: Hard collar;At all times Other Brace/Splint: Use locked bledsoe RLE when and if he can start attempting to stand again.         Mobility Bed Mobility                  Transfers                      Balance                                           ADL either performed or assessed with clinical judgement   ADL Overall ADL's : Needs assistance/impaired Eating/Feeding: Set up;Sitting;With adaptive utensils Eating/Feeding Details (indicate cue type and reason): Pt particapted in self-feeding with built-up handle on spoon with RUE. Faciliated problem solving to position the tubing on the spoon. Attempted to use hand-based cuff. Pt reported difficulty using the cuff. Pt was able to feed him self popcorn once it was placed into a bowl. However continues to display weakness in pinch and grasp as seen by dropping popcorn.  General ADL Comments: Focused session on feeding with adaptive equipment.     Vision       Perception     Praxis      Cognition Arousal/Alertness: Awake/alert Behavior During Therapy: Flat affect Overall Cognitive Status: Within Functional Limits for tasks assessed                                 General Comments: Cognition WNF. Noted decreased engagement and sad demeanor. Enjoyed playing jazz music during session.        Exercises Exercises: General Upper Extremity General Exercises - Upper Extremity Shoulder Flexion:  Right;Left;PROM;AAROM;20 reps;Seated(LUE always PROM) Shoulder Extension: PROM;AAROM;Right;Left;20 reps;Seated(LUE always PROM) Elbow Flexion: PROM;AAROM;Right;Left;20 reps;Seated(LUE always PROM) Elbow Extension: PROM;AAROM;Right;Left;20 reps;Seated(LUE always PROM) Wrist Flexion: PROM;AAROM;20 reps;Right;Left;Seated(LUE always PROM) Wrist Extension: PROM;AAROM;Right;Left;20 reps;Seated(LUE always PROM) Digit Composite Flexion: PROM;AAROM;Right;Left;Seated;20 reps(LUE always PROM) Composite Extension: PROM;AAROM;Right;Left;Seated(LUE always PROM. 20 reps.)   Shoulder Instructions       General Comments      Pertinent Vitals/ Pain       Pain Assessment: Faces Faces Pain Scale: Hurts little more Pain Location: left leg Pain Descriptors / Indicators: Aching;Constant Pain Intervention(s): Limited activity within patient's tolerance;Monitored during session;Repositioned  Home Living                                          Prior Functioning/Environment              Frequency  Min 2X/week        Progress Toward Goals  OT Goals(current goals can now be found in the care plan section)  Progress towards OT goals: Progressing toward goals  Acute Rehab OT Goals Patient Stated Goal: to have answers, know a plan, get better OT Goal Formulation: With patient Time For Goal Achievement: 07/17/18 Potential to Achieve Goals: Good ADL Goals Pt Will Perform Eating: with modified independence;with adaptive utensils;sitting Pt Will Perform Grooming: with min guard assist;sitting Pt Will Perform Upper Body Dressing: with min assist;sitting Pt Will Perform Lower Body Dressing: with mod assist;sitting/lateral leans;with adaptive equipment Pt Will Transfer to Toilet: with mod assist;bedside commode;stand pivot transfer;with transfer board Pt Will Perform Toileting - Clothing Manipulation and hygiene: with mod assist;sitting/lateral leans;sit to/from stand Pt/caregiver  will Perform Home Exercise Program: Increased ROM;Increased strength;Both right and left upper extremity;With theraputty;With written HEP provided Additional ADL Goal #1: Pt will perform bed mobility with modA as precursor to EOB/OOB activity. Additional ADL Goal #2: Pt will maintain static sitting EOB with minguard assist during ADL task.  Plan Discharge plan remains appropriate    Co-evaluation                 AM-PAC PT "6 Clicks" Daily Activity     Outcome Measure   Help from another person eating meals?: A Little Help from another person taking care of personal grooming?: A Little Help from another person toileting, which includes using toliet, bedpan, or urinal?: Total Help from another person bathing (including washing, rinsing, drying)?: A Lot Help from another person to put on and taking off regular upper body clothing?: A Lot Help from another person to put on and taking off regular lower body clothing?: Total 6 Click Score: 12    End of Session Equipment Utilized During Treatment: Cervical collar(Adpative built-up handles and feeding cuffs.)  OT Visit  Diagnosis: Other abnormalities of gait and mobility (R26.89);Muscle weakness (generalized) (M62.81);Pain Pain - Right/Left: Right Pain - part of body: Leg   Activity Tolerance Patient tolerated treatment well   Patient Left in chair;with call bell/phone within reach   Nurse Communication Mobility status;Need for lift equipment;Other (comment)(Pt needing bath and back to bed.)        Time: 1610-9604 OT Time Calculation (min): 32 min  Charges: OT General Charges $OT Visit: 1 Visit OT Evaluation $OT Eval Moderate Complexity: 1 Mod OT Treatments $Self Care/Home Management : 8-22 mins $Therapeutic Activity: 8-22 mins  Zsazsa Bahena MSOT, OTR/L Acute Rehab Pager: [phone] Office: [phone]   [name] [name] 07/17/2018, 3:37 PM

## 2018-07-17 NOTE — Progress Notes (Signed)
Inpatient Diabetes Program Recommendations  AACE/ADA: New Consensus Statement on Inpatient Glycemic Control (2015)  Target Ranges:  Prepandial:   less than 140 mg/dL      Peak postprandial:   less than 180 mg/dL (1-2 hours)      Critically ill patients:  140 - 180 mg/dL   Results for ANDREUS, CURE (MRN [id number]) as of 07/17/2018 11:21  Ref. Range 07/16/2018 07:53 07/16/2018 12:25 07/16/2018 15:29 07/16/2018 21:31  Glucose-Capillary Latest Ref Range: 70 - 99 mg/dL 253 (H)  3 units NOVOLOG 265 (H)  5 units NOVOLOG  327 (H)  7 units NOVOLOG  251 (H)  3 units NOVOLOG +  30 units LANTUS   Results for LORD, LANCOUR (MRN [id number]) as of 07/17/2018 11:21  Ref. Range 07/17/2018 07:41  Glucose-Capillary Latest Ref Range: 70 - 99 mg/dL 259 (H)   Results for PINCHOS, TOPEL (MRN [id number]) as of 07/17/2018 11:21  Ref. Range 07/16/2018 05:59  Hemoglobin A1C Latest Ref Range: 4.8 - 5.6 % 5.8 (H)    Diabetes history:None  Outpatient Diabetes medications:None  Current Orders: Lantus 30 unts QHS     Novolog Sensitive Correction Scale/ SSI (0-9 units) TID AC + HS      Getting Decadron 8 mg BID.  CBGs quite elevated.   MD- Please consider the following in-hospital insulin adjustments while patient getting Decadron:  Start Novolog Meal Coverage: Novolog 6 units TID with meals  (Please add the following Hold Parameters: Hold if pt eats <50% of meal, Hold if pt NPO)      --Will follow patient during hospitalization--  Ambrose Finland RN, MSN, CDE Diabetes Coordinator Inpatient Glycemic Control Team Team Pager: [phone] (8a-5p)

## 2018-07-17 NOTE — Progress Notes (Signed)
Physical Therapy Treatment Patient Details Name: [name] [name] MRN: [id number] DOB: [birth year] Today's Date: 07/17/2018    History of Present Illness 52 y.o. male admitted on 06/06/18 for bil LE wekness and muscle spasm after multiple falls (with resultant R knee meniscus tear placed in bledsoe brace) following a recent ACDF at [facility] in July 2019 (complicated by PE (s/p IVC filter), CIR, and then SNF placement before finally returning home with his parents).  In the ED MRI of c-spine which showed post operative changes and cord edema and possibility of ischemia.  His R knee was aspirated on 06/09/18.  S/p posterior cervical decompression and fusion on 06/14/18. Entire acute course complicated by hypotension in standing (thought to be autonomic) and by finding of multiple LE DVTs (already has an IVC filter in place, but per MDs notes at risk for blocking the filter with these clots).  Pt with post op issues with incisional bleeding s/p 2 evacuations of hematomas and placement of hemovac (06/22/18 and 07/01/18) as well as multiple units of plasma.  Cardiology consulted 07/05/18 due to bradycardia thought to be from his newly started blood thinner.  Pt with another re-accumulation of epidural hematoma with increased L sided weakness and cord compression with emergent evacuation surgery 10/25 & 07/10/18.  Pt also needing more platelets post op.  Bone marrow bx 07/12/18. PMHx of HTN, gout, anemia, DDD (lumbar)    PT Comments    Pt remains frustrated with extremity edema, lack of significant mobility progression and limited ability to assist with mobility. Pt participating in all aspects of mobility and HEP as able and with improved affect and even smiling in chair. Pt educated for need to progress tolerance for OOB via lift to chair in preparation for WC mobility. Pt verbalized understanding and appreciative of rationale and plan for mobility.    Follow Up Recommendations  SNF;LTACH     Equipment  Recommendations  Wheelchair (measurements PT);Wheelchair cushion (measurements PT);Hospital bed;Other (comment)(hoyer lift, 20x20 tilt in space power chair ideal)    Recommendations for Other Services       Precautions / Restrictions Precautions Precautions: Fall;Cervical Required Braces or Orthoses: Other Brace/Splint;Cervical Brace Cervical Brace: Hard collar;At all times Other Brace/Splint: Use locked bledsoe RLE when and if he can start attempting to stand again.      Mobility  Bed Mobility Overal bed mobility: Needs Assistance Bed Mobility: Rolling Rolling: +2 for physical assistance;Total assist;Max assist         General bed mobility comments: pt with max +2 assist to roll left, total assist to roll right with assist to bend knees and hooking with RUE to roll. Lift pad placed under pt and lifted bed to chair. Increased success with lift with placing it sideways not head on.   Transfers                 General transfer comment: Maxi move used to assist pt OOB to the chair for upright positioning.   Ambulation/Gait             General Gait Details: unable at this time.    Stairs             Wheelchair Mobility    Modified Rankin (Stroke Patients Only)       Balance  Cognition Arousal/Alertness: Awake/alert Behavior During Therapy: Flat affect Overall Cognitive Status: Impaired/Different from baseline                       Memory: Decreased short-term memory         General Comments: pt remains frustrated with lack of progression, continued edema and lack of LLE movement. PT more cheerful in chair and happy to understand lift to chair to initiate transition to [facility] for increased mobility      Exercises General Exercises - Upper Extremity Shoulder Horizontal ABduction: PROM;5 reps;Left;Supine Elbow Flexion: PROM;5 reps;Left;Supine General Exercises - Lower  Extremity Heel Slides: PROM;5 reps;Both;Supine Hip ABduction/ADduction: PROM;5 reps;Both;Supine    General Comments        Pertinent Vitals/Pain Pain Score: 8  Pain Location: left leg Pain Descriptors / Indicators: Aching;Constant    Home Living                      Prior Function            PT Goals (current goals can now be found in the care plan section) Acute Rehab PT Goals Time For Goal Achievement: 07/31/18 Progress towards PT goals: Goals downgraded-see care plan    Frequency    Min 2X/week      PT Plan Current plan remains appropriate    Co-evaluation              AM-PAC PT "6 Clicks" Daily Activity  Outcome Measure  Difficulty turning over in bed (including adjusting bedclothes, sheets and blankets)?: Unable Difficulty moving from lying on back to sitting on the side of the bed? : Unable Difficulty sitting down on and standing up from a chair with arms (e.g., wheelchair, bedside commode, etc,.)?: Unable Help needed moving to and from a bed to chair (including a wheelchair)?: Total Help needed walking in hospital room?: Total Help needed climbing 3-5 steps with a railing? : Total 6 Click Score: 6    End of Session Equipment Utilized During Treatment: Cervical collar Activity Tolerance: Patient tolerated treatment well Patient left: in chair;with call bell/phone within reach Nurse Communication: Need for lift equipment;Mobility status PT Visit Diagnosis: Other abnormalities of gait and mobility (R26.89);Other symptoms and signs involving the nervous system (R29.898);Muscle weakness (generalized) (M62.81)     Time: 1610-9604 PT Time Calculation (min) (ACUTE ONLY): 35 min  Charges:  $Therapeutic Activity: 23-37 mins                     Mouhamadou Gittleman Abner Greenspan, PT Acute Rehabilitation Services Pager: [phone] Office: [phone]    Stefhanie Kachmar B Euclide Granito 07/17/2018, 9:05 AM

## 2018-07-17 NOTE — Progress Notes (Signed)
Patient ID: [name] [name], male   DOB: 03-02-1966, 52 y.o.   MRN: [id number] Patient low more depressed today secondary to whole-body increased edema and swelling. Stiffness in his hand stiffness in his legs.  Neurologically she stable still has some movement in the left foot weak left grip right side stronger at its baseline.  Historically he is responded with steroids, and go back up on his toes he was unable milligrams every 12 I am going to increase it to 10 every 6 overnight significant help some swelling.

## 2018-07-17 NOTE — Progress Notes (Signed)
PROGRESS NOTE    [name] [name]  OMV:[id number] DOB: August 15, 1966 DOA: 06/06/2018 PCP: Helane Rima, MD    Brief Narrative: Brief Narrative:  52 year old man with past medical history relevant for gout, hypertension anterior cervical corpectomy of C4 on 03/24/2018 with comp gated postoperative course including development of multiple PEs and DVTs started on anticoagulation and subsequently developed hematoma requiring reexploration on 03/28/2018 status post placement of IVC filter who was discharged to skilled nursing facility and had a complicated course including progressive lower extremity weakness, intermittent fevers of unclear etiology and hospitalizations for "pneumonia". Patient was admitted to this hospital is a transfer from [facility] with MRI of C-spine that showed postoperative changes and cord compression with cord edema and concern for ischemia. Patient is status post posterior cervical decompression laminectomy of C3, C4, C5, removal of spinous processes, foraminotomies on 06/14/2018. On 06/22/2018 patient began to develop worsening left sided paresthesias and weakness and a stat MRI showed evidence of postoperative hematoma. Patient was taken to the OR with evacuation of hematoma. Additionally his postoperative course has been complicated by recurrent fevers,bilateral lower extremity DVTs on ultrasound,migratory arthralgias/arthritis that are thought to be rheumatologic in nature as well as significant orthostasis limiting the amount he can work with physical therapy. His arthralgia has resolved.   Noted to have decreased left hand grip on 10/19, MRI showed large epidural hematoma, patient s/p clot evacuation.  He was place back on anticoagulation argatroban on 10-22. He was notice to be more bradycardic with HR in the 40 on 10-23. Cardiology was consulted, and argatroban was transition to bivalirrubin to avoid bradycardia. His HR improved after argatroban was discontinue.  -HIT panel 0.486 (mildly elevated)- SRA (serotonin release assay) negative. Dr Marin Olp has been helping with patient care. He is recommending to avoid heparin or Lovenox products.   Patient developed weakness of left arm on 10-25. He was take emergently to OR for recurrence of epidural hematoma on 10-25. Anticoagulation was discontinue since 10-25. Dr Saintclair Halsted is recommending for patient to remain off anticoagulation for alt leas 1 week, and if anticoagulation is consider, that will need to be discuss with DR Saintclair Halsted.  Patient the night of 10-28 develops left arm weakness, numbness. Stat MRI was ordered, which confirm recurrent hematoma. Patient was taken to OR for hematoma evacuation 10-28. He has been off anticoagulation since 10-25. For his thrombocytopenia, a Bone marrow biopsy was done. For LE DVT, he has IVC filter. He cant not be on anticoagulation due to recurrent bleeding.  Patient has received multiple platelet transfusions.    Assessment & Plan:   Principal Problem:   Fever Active Problems:   Myelopathy (HCC)   Benign essential HTN   Cord compression (HCC)   History of pulmonary embolism   Spondylosis, cervical, with myelopathy   Labile blood pressure   Orthostatic hypotension   Leukocytosis   Tachycardia-bradycardia syndrome (HCC)   Adjustment disorder with mixed anxiety and depressed mood   Near syncope due to orthostatic hypotension Thought to be related to hypovolemia and autonomic dysfunction.  Echocardiogram July 2019 showed a normal systolic function, grade 2 diastolic dysfunction.  No significant valve abnormalities noted. Patient was placed on midodrine.  Midodrine dose was decreased due to bradycardia.  Abdominal binder.    Sinus bradycardia Patient initially developed bradycardia on 10/23.  He was asymptomatic.  TSH was 1.24.  Cardiology was consulted at that time.  There was no indication for pacemaker.  His heart rate stabilized and improved.  He again became  bradycardic on 11/1.  EKG shows sinus bradycardia.  Telemetry does not show any pauses.  Dose of midodrine was decreased as midodrine can sometimes cause bradycardia.  Autonomic dysfunction is another possible etiology.  Heart rate seems to be stable.  He is not on any AV nodal blocking agents.  Continue to monitor.    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 Difficult situation with patient recent thrombotic event. -He developed left arm weakness 10-25- and 10-28, he was take to OR for epidural hematoma evacuation.  Neurosurgery continues to follow.  Defer management to them.  He remains on dexamethasone.  Drains were removed yesterday.  Normocytic anemia Low hemoglobin noted on 11/2 was most likely lab error.  Patient was transfused 1 unit of blood.  Subsequent hemoglobins have been stable.  No evidence for any overt bleeding.  CT scan of the chest abdomen pelvis did not show any bleeding.    Thrombocytopenia/coagulopathy -Cardiolipin antibody negative -Lupus anticoagulant, homocysteine level, factor VIII within normal limits -Protein S activity 60 (low) -Protein C total >200; Activity 199 (high)  -HIT panel 0.486 (mildly elevated)- SRA (serotonin release assay) negative -immature platelet fraction WNL -Was given 2u platelets on 10/19 in the OR. And 2 units 10-25 for OR. Platelet transfusion 10/29 Patient transfused platelets and given vitamin K on 10/30. Patient underwent bone marrow biopsy on 10/30.  Pathology report suggests nonspecific  findings.  Hematology suspects that some of his coagulopathy and platelet low platelet counts likely due to medications.  Platelet count remains stable but low.    Polyarthralgia/persistent fever -At the time of admission patient had symptoms in the shoulders and ankles, along with a history of gout and pain in his knees.  He underwent arthrocentesis however no evidence of infection was found. -He was noted to have elevated inflammatory markers including CCP, CRP.  However ANCA, aldolase, ANA were negative. -Previous rounding physicians have discussed with Dr. Kathlene November, rheumatologist, he will follow-up with the patient as an outpatient. -Patient was placed on prednisone however currently on dexamethasone (neurosurgery) Fever has resolved.  He was noted to be on doxycycline since 10/18.  Reason is not entirely clear.  Discontinued on 10/31.  Remains afebrile.  Recent PE /DVT in setting of spine surgery -Status post IVC filter placement on March 28, 2018 -Patient was on apixaban and changed over to IV heparin.  Anticoagulation however was discontinued for surgery.  Patient also noted to have thrombocytopenia. -Vascular DVT US 06/26/2018 showed bilateral lower extremity DVTs likely acute -Case was discussed with  vascular surgery, Dr. Trula Slade.  Also discussed with interventional radiology.  Unfortunately patient does not have any good options vis--vis either thrombolysis or mechanical thrombectomy. -Bivalirudin discontinue by neurosurgery. Serotonin release assay negative.  Remains off of anticoagulation due to bleeding episodes.  Will need neurosurgery input before anti-coagulation can be reconsidered. Patient given Lasix on 11/3 for upper extremity edema.  Previous ultrasound negative for upper extremity DVT.  We will give additional dose today.    Gout Stable.  Continue Uloric  Mild acute renal failure Resolved.  Electrolytes and renal function normal this morning.  Essential  hypertension    Amlodipine discontinue due to orthostatic hypotension and edema.  Blood pressure remains stable.  Some degree of hypertension likely due to midodrine.  Hyperglycemia; from steroids.  Continue to monitor CBGs.  Hold off on increasing dose of Lantus today.  We will reconsider tomorrow.  HbA1c 5.8.  Hyperglycemia most likely due to steroids.  Adjustment disorder with depression and anxiety Patient noted to be very depressed over the last few days.  No suicidal ideation.  Psychiatry was consulted.  They recommended changing Xanax to Klonopin on a scheduled basis.  They also recommended sertraline.  Patient's affect has improved.  We will change his Xanax to Klonopin.  However we will hold off on sertraline due to his hematological issues.  Abnormal CT findings CT scan raised concern for pneumonia but patient does not have any respiratory symptoms.  He is afebrile.  Normal WBC.  He has been on Ancef for the past many days for prophylaxis against the drains.  No need to pursue this further.  Gallstones noted as well.  Results were communicated to the patient.  DVT prophylaxis: TED's.  No anticoagulants due to frequent epidural bleeding Code Status: full code.  Family Communication: discussed with patient,  Disposition Plan: Patient remains stable for now.  Further management per hematology and neurosurgery.  His clinical condition remains tenuous.    Consultants:   Neurosurgery  Hematology  Cardiology    Procedures: On 10/2 1. posterior cervical decompressive laminectomy at C3, C4, C5 with removal of the spinous process and complete laminectomy and foraminotomies of the C3, C4, C5 nerve roots. 2. Posterior cervical fusion with lateral mass screws at C3-C4-C5 and pars screws at C2 lies in the globus ellipseLateral mass screw system 3. Posterior lateral arthrodesis C2-C5 utilizing locally harvested autograft mixed with vivigen  06/22/2018: Procedure reexploration posterior cervical  wound for evacuation of epidural hematoma  06/26/2018 DVT ultrasound:Right: Findings consistent with acute deep vein thrombosis involving the right common femoral vein, right femoral vein, right popliteal vein, right posterior tibial vein, and right peroneal vein. Thrombosis extends proximally into external iliac vein.  Proximal iliac vein not visualized. Left: Findings consistent with acute deep vein thrombosis involving the left common femoral vein, left femoral vein, left popliteal vein, left posterior tibial vein, and left peroneal vein. Findings consistent with acute superficial vein thrombosis  involving the left great saphenous vein. Thrombosis extends proximally into external iliac vein. Proximal iliac vein not visualized.  07/01/18 Clot evacuation   RUE doppler 07/02/18    Antimicrobials:  Ancef.   Subjective: Patient feels about the same.  Able to move his right arm more than the left.  Still complains of swelling of his arms.  No other complaints offered.  Objective: Vitals:   07/16/18 1900 07/16/18 2300 07/17/18 0347 07/17/18 0900  BP: 122/83 (!) 157/98 135/86   Pulse: 96 80 (!) 57 65  Resp: '20 17 15   ' Temp: 98.6 F (37 C) 98 F (36.7 C) 97.9 F (36.6 C) 97.9 F (36.6 C)  TempSrc: Oral Oral Oral   SpO2: 100% 100% 99%   Weight:      Height:        Intake/Output Summary (Last 24 hours) at 07/17/2018 0948 Last data filed at 07/17/2018 0900 Gross per 24 hour  Intake 720 ml  Output 2501 ml  Net -1781 ml   Filed Weights   06/14/18 1151 06/22/18 1641 07/07/18 1000  Weight: 123.8 kg 123.8 kg 123.8 kg    Examination:  General exam: Awake alert.  In no distress Respiratory system: Normal effort at rest.  Clear to auscultation bilaterally Cardiovascular system: Sinus bradycardia noted on telemetry.  Heart rate in the 50s and 60s.  S1-S2 is normal regular.   Gastrointestinal system: Abdomen soft.  Nontender nondistended.  Bowel sounds present and  normal. Improved edema in the upper extremities    Data Reviewed: I have personally reviewed following labs and imaging studies  CBC: Recent Labs  Lab 07/11/18 0605  07/14/18 1312 07/15/18 0514 07/15/18 1059 07/16/18 0559 07/17/18 0446  WBC 5.2   < > 5.9 4.5 7.4 6.2 6.1  NEUTROABS 4.7  --  5.2  --   --  5.5 5.4  HGB 8.0*   < > 8.2* 5.7* 10.7* 10.2* 10.1*  HCT 25.8*   < > 25.2* 18.6* 33.4* 32.1* 31.8*  MCV 98.5   < > 94.7 98.4 94.6 95.5 96.4  PLT 62*   < > 53* 37* 58* 66* 53*   < > = values in this interval not displayed.   Basic Metabolic Panel: Recent Labs  Lab 07/14/18 1312 07/15/18 0514 07/15/18 1059 07/16/18 0547 07/17/18 0446  NA 141 QUESTIONABLE RESULTS, RECOMMEND RECOLLECT TO VERIFY 140 137 139  K 3.9 QUESTIONABLE RESULTS, RECOMMEND RECOLLECT TO VERIFY 4.0 4.2 4.2  CL 111 QUESTIONABLE RESULTS, RECOMMEND RECOLLECT TO VERIFY 107 105 107  CO2 28 QUESTIONABLE RESULTS, RECOMMEND RECOLLECT TO VERIFY '27 25 29  ' GLUCOSE 279* QUESTIONABLE RESULTS, RECOMMEND RECOLLECT TO VERIFY 279* 284* 197*  BUN 28* QUESTIONABLE RESULTS, RECOMMEND RECOLLECT TO VERIFY 32* 33* 30*  CREATININE 0.62 QUESTIONABLE RESULTS, RECOMMEND RECOLLECT TO VERIFY 0.76 0.70 0.66  CALCIUM 8.2* QUESTIONABLE RESULTS, RECOMMEND RECOLLECT TO VERIFY 9.1 8.7* 8.8*   GFR: Estimated Creatinine Clearance: 140.4 mL/min (by C-G formula based on SCr of 0.66 mg/dL).  Coagulation Profile: Recent Labs  Lab 07/12/18 0500 07/13/18 0630 07/14/18 1312  INR 1.29 1.31 1.36   CBG: Recent Labs  Lab 07/16/18 0753 07/16/18 1225 07/16/18 1529 07/16/18 2131 07/17/18 0741  GLUCAP 248* 265* 327* 251* 161*      Radiology Studies: Ct Chest W Contrast  Result Date: 07/15/2018 CLINICAL DATA:  GI bleed.  Decreased hematocrit EXAM: CT CHEST, ABDOMEN, AND PELVIS WITH CONTRAST TECHNIQUE: Multidetector CT imaging of the chest, abdomen and pelvis was performed following the standard protocol during bolus administration of  intravenous contrast. CONTRAST:  140m ISOVUE-300 IOPAMIDOL (ISOVUE-300) INJECTION 61% COMPARISON:  CT AP 07/12/2018 and CT chest 06/23/2018 FINDINGS: CT CHEST FINDINGS Cardiovascular: The heart size appears within normal limits. No pericardial effusion identified. Mediastinum/Nodes: No enlarged mediastinal, hilar, or axillary lymph nodes. Thyroid gland, trachea, and esophagus demonstrate no significant findings. Lungs/Pleura: There is asymmetric elevation of the left hemidiaphragm. Subsegmental atelectasis within the left lower lobe and right lower lobe identified. Mild patchy airspace densities within the posterior right upper lobe identified. This appears similar to previous exam. Small dense nodule within the lateral left upper lobe measures 3 mm, image 79/4. Musculoskeletal: No chest wall mass or suspicious bone lesions identified. CT ABDOMEN PELVIS FINDINGS Hepatobiliary: Multiple stones identified within the gallbladder measuring up to 2 cm. No focal liver abnormality. No biliary ductal dilatation. Pancreas: Unremarkable. No pancreatic ductal dilatation or surrounding inflammatory changes. Spleen: Normal in size without focal abnormality. Adrenals/Urinary Tract: The adrenal glands appear normal. No kidney mass or hydronephrosis. Urinary bladder appears normal. Stomach/Bowel: Increased density within the gastric lumen is identified. This is nonspecific particularly since the patient ingested positive oral contrast material. No  pathologic dilatation of the colon. No colonic mass identified. Vascular/Lymphatic: Normal appearance of the abdominal aorta. The portal vein and hepatic veins appear patent. There is a filter within the IVC. No adenopathy within the abdomen or pelvis. Reproductive: Prostate is unremarkable. Other: Trace abdominopelvic ascites. Fat containing umbilical hernia. Musculoskeletal: Body wall edema is identified compatible with anasarca. No acute or significant osseous abnormality. IMPRESSION:  1. No hematoma identified. Increased density within the gastric lumen noted, which is nonspecific in the setting of positive oral contrast material. Correlate for any clinical signs or symptoms of upper GI tract bleed. 2. Persistent patchy airspace densities within the posterior right upper lobe compatible with pneumonia. 3. Bilateral lower lobe subsegmental atelectasis with asymmetric elevation of the left hemidiaphragm. 4. Gallstones. 5. Trace ascites within the abdomen and pelvis. 6. Body wall edema noted. Electronically Signed   By: Kerby Moors M.D.   On: 07/15/2018 19:10   Ct Abdomen Pelvis W Contrast  Result Date: 07/15/2018 CLINICAL DATA:  GI bleed.  Decreased hematocrit EXAM: CT CHEST, ABDOMEN, AND PELVIS WITH CONTRAST TECHNIQUE: Multidetector CT imaging of the chest, abdomen and pelvis was performed following the standard protocol during bolus administration of intravenous contrast. CONTRAST:  153m ISOVUE-300 IOPAMIDOL (ISOVUE-300) INJECTION 61% COMPARISON:  CT AP 07/12/2018 and CT chest 06/23/2018 FINDINGS: CT CHEST FINDINGS Cardiovascular: The heart size appears within normal limits. No pericardial effusion identified. Mediastinum/Nodes: No enlarged mediastinal, hilar, or axillary lymph nodes. Thyroid gland, trachea, and esophagus demonstrate no significant findings. Lungs/Pleura: There is asymmetric elevation of the left hemidiaphragm. Subsegmental atelectasis within the left lower lobe and right lower lobe identified. Mild patchy airspace densities within the posterior right upper lobe identified. This appears similar to previous exam. Small dense nodule within the lateral left upper lobe measures 3 mm, image 79/4. Musculoskeletal: No chest wall mass or suspicious bone lesions identified. CT ABDOMEN PELVIS FINDINGS Hepatobiliary: Multiple stones identified within the gallbladder measuring up to 2 cm. No focal liver abnormality. No biliary ductal dilatation. Pancreas: Unremarkable. No pancreatic  ductal dilatation or surrounding inflammatory changes. Spleen: Normal in size without focal abnormality. Adrenals/Urinary Tract: The adrenal glands appear normal. No kidney mass or hydronephrosis. Urinary bladder appears normal. Stomach/Bowel: Increased density within the gastric lumen is identified. This is nonspecific particularly since the patient ingested positive oral contrast material. No pathologic dilatation of the colon. No colonic mass identified. Vascular/Lymphatic: Normal appearance of the abdominal aorta. The portal vein and hepatic veins appear patent. There is a filter within the IVC. No adenopathy within the abdomen or pelvis. Reproductive: Prostate is unremarkable. Other: Trace abdominopelvic ascites. Fat containing umbilical hernia. Musculoskeletal: Body wall edema is identified compatible with anasarca. No acute or significant osseous abnormality. IMPRESSION: 1. No hematoma identified. Increased density within the gastric lumen noted, which is nonspecific in the setting of positive oral contrast material. Correlate for any clinical signs or symptoms of upper GI tract bleed. 2. Persistent patchy airspace densities within the posterior right upper lobe compatible with pneumonia. 3. Bilateral lower lobe subsegmental atelectasis with asymmetric elevation of the left hemidiaphragm. 4. Gallstones. 5. Trace ascites within the abdomen and pelvis. 6. Body wall edema noted. Electronically Signed   By: TKerby MoorsM.D.   On: 07/15/2018 19:10        Scheduled Meds: . dexamethasone  8 mg Intravenous Q12H  . docusate sodium  100 mg Oral Daily  . gabapentin  300 mg Oral TID  . Gerhardt's butt cream   Topical BID  . Influenza  vac split quadrivalent PF  0.5 mL Intramuscular Tomorrow-1000  . insulin aspart  0-5 Units Subcutaneous QHS  . insulin aspart  0-9 Units Subcutaneous TID WC  . insulin glargine  30 Units Subcutaneous QHS  . methocarbamol  750 mg Oral QID  . midodrine  5 mg Oral BID WC  .  nystatin  5 mL Oral TID AC & HS  . senna-docusate  1 tablet Oral QHS  . sodium chloride flush  10-40 mL Intracatheter Q12H   Continuous Infusions: . sodium chloride    . famotidine (PEPCID) IV    . lactated ringers 10 mL/hr at 07/10/18 0900     LOS: 40 days     Bonnielee Haff, MD [facility] Pager [phone]  If 7PM-7AM, please contact night-coverage [email or web address] Password [facility] 07/17/2018, 9:48 AM

## 2018-07-18 LAB — TYPE AND SCREEN
ABO/RH(D): O POS
Antibody Screen: NEGATIVE
Unit division: 0
Unit division: 0
Unit division: 0
Unit division: 0
Unit division: 0
Unit division: 0

## 2018-07-18 LAB — CBC WITH DIFFERENTIAL/PLATELET
ABS IMMATURE GRANULOCYTES: 0.12 10*3/uL — AB (ref 0.00–0.07)
Basophils Absolute: 0 10*3/uL (ref 0.0–0.1)
Basophils Relative: 0 %
EOS PCT: 0 %
Eosinophils Absolute: 0 10*3/uL (ref 0.0–0.5)
HEMATOCRIT: 33.6 % — AB (ref 39.0–52.0)
HEMOGLOBIN: 10.5 g/dL — AB (ref 13.0–17.0)
Immature Granulocytes: 2 %
LYMPHS ABS: 0.5 10*3/uL — AB (ref 0.7–4.0)
LYMPHS PCT: 7 %
MCH: 29.7 pg (ref 26.0–34.0)
MCHC: 31.3 g/dL (ref 30.0–36.0)
MCV: 94.9 fL (ref 80.0–100.0)
Monocytes Absolute: 0.2 10*3/uL (ref 0.1–1.0)
Monocytes Relative: 3 %
Neutro Abs: 6.7 10*3/uL (ref 1.7–7.7)
Neutrophils Relative %: 88 %
Platelets: 53 10*3/uL — ABNORMAL LOW (ref 150–400)
RBC: 3.54 MIL/uL — ABNORMAL LOW (ref 4.22–5.81)
RDW: 17.3 % — ABNORMAL HIGH (ref 11.5–15.5)
WBC: 7.6 10*3/uL (ref 4.0–10.5)
nRBC: 0 % (ref 0.0–0.2)

## 2018-07-18 LAB — BPAM RBC
Blood Product Expiration Date: 201911212359
Blood Product Expiration Date: 201911292359
Blood Product Expiration Date: 201911292359
Blood Product Expiration Date: 201912012359
Blood Product Expiration Date: 201912012359
Blood Product Expiration Date: 201912012359
ISSUE DATE / TIME: 201911010338
ISSUE DATE / TIME: 201911010551
ISSUE DATE / TIME: 201911020645
ISSUE DATE / TIME: 201911031104
ISSUE DATE / TIME: 201911031316
Unit Type and Rh: 5100
Unit Type and Rh: 5100
Unit Type and Rh: 5100
Unit Type and Rh: 5100
Unit Type and Rh: 5100
Unit Type and Rh: 5100

## 2018-07-18 LAB — COMPREHENSIVE METABOLIC PANEL
ALBUMIN: 2.6 g/dL — AB (ref 3.5–5.0)
ALK PHOS: 59 U/L (ref 38–126)
ALT: 10 U/L (ref 0–44)
ANION GAP: 5 (ref 5–15)
AST: 19 U/L (ref 15–41)
BILIRUBIN TOTAL: 0.6 mg/dL (ref 0.3–1.2)
BUN: 29 mg/dL — ABNORMAL HIGH (ref 6–20)
CALCIUM: 8.8 mg/dL — AB (ref 8.9–10.3)
CO2: 28 mmol/L (ref 22–32)
CREATININE: 0.63 mg/dL (ref 0.61–1.24)
Chloride: 104 mmol/L (ref 98–111)
GFR calc non Af Amer: >60 mL/min (ref >60)
GLUCOSE: 255 mg/dL — AB (ref 70–99)
Potassium: 4.3 mmol/L (ref 3.5–5.1)
Sodium: 137 mmol/L (ref 135–145)
TOTAL PROTEIN: 4.8 g/dL — AB (ref 6.5–8.1)

## 2018-07-18 LAB — APTT: aPTT: 25 seconds (ref 24–36)

## 2018-07-18 LAB — LACTATE DEHYDROGENASE: LDH: 331 U/L — ABNORMAL HIGH (ref 98–192)

## 2018-07-18 LAB — GLUCOSE, CAPILLARY
GLUCOSE-CAPILLARY: 271 mg/dL — AB (ref 70–99)
Glucose-Capillary: 254 mg/dL — ABNORMAL HIGH (ref 70–99)
Glucose-Capillary: 314 mg/dL — ABNORMAL HIGH (ref 70–99)
Glucose-Capillary: 253 mg/dL — ABNORMAL HIGH (ref 70–99)

## 2018-07-18 MED ORDER — ALBUMIN HUMAN 25 % IV SOLN
50.0000 g | Freq: Once | INTRAVENOUS | Status: AC
  Administered 2018-07-18: 50 g via INTRAVENOUS
  Filled 2018-07-18: qty 200

## 2018-07-18 MED ORDER — FUROSEMIDE 10 MG/ML IJ SOLN
60.0000 mg | Freq: Once | INTRAMUSCULAR | Status: AC
  Administered 2018-07-18: 60 mg via INTRAVENOUS
  Filled 2018-07-18: qty 6

## 2018-07-18 NOTE — Progress Notes (Signed)
PROGRESS NOTE    [name] [name]  IFO:[id number] DOB: 06/18/1966 DOA: 06/06/2018 PCP: Helane Rima, MD    Brief Narrative: Brief Narrative:  52 year old man with past medical history relevant for gout, hypertension anterior cervical corpectomy of C4 on 03/24/2018 with comp gated postoperative course including development of multiple PEs and DVTs started on anticoagulation and subsequently developed hematoma requiring reexploration on 03/28/2018 status post placement of IVC filter who was discharged to skilled nursing facility and had a complicated course including progressive lower extremity weakness, intermittent fevers of unclear etiology and hospitalizations for "pneumonia". Patient was admitted to this hospital is a transfer from [facility] with MRI of C-spine that showed postoperative changes and cord compression with cord edema and concern for ischemia. Patient is status post posterior cervical decompression laminectomy of C3, C4, C5, removal of spinous processes, foraminotomies on 06/14/2018. On 06/22/2018 patient began to develop worsening left sided paresthesias and weakness and a stat MRI showed evidence of postoperative hematoma. Patient was taken to the OR with evacuation of hematoma. Additionally his postoperative course has been complicated by recurrent fevers,bilateral lower extremity DVTs on ultrasound,migratory arthralgias/arthritis that are thought to be rheumatologic in nature as well as significant orthostasis limiting the amount he can work with physical therapy. His arthralgia has resolved.   Noted to have decreased left hand grip on 10/19, MRI showed large epidural hematoma, patient s/p clot evacuation.  He was place back on anticoagulation argatroban on 10-22. He was notice to be more bradycardic with HR in the 40 on 10-23. Cardiology was consulted, and argatroban was transition to bivalirrubin to avoid bradycardia. His HR improved after argatroban was discontinue.  -HIT panel 0.486 (mildly elevated)- SRA (serotonin release assay) negative. Dr Marin Olp has been helping with patient care. He is recommending to avoid heparin or Lovenox products.   Patient developed weakness of left arm on 10-25. He was take emergently to OR for recurrence of epidural hematoma on 10-25. Anticoagulation was discontinue since 10-25. Dr Saintclair Halsted is recommending for patient to remain off anticoagulation for alt leas 1 week, and if anticoagulation is consider, that will need to be discuss with DR Saintclair Halsted.  Patient the night of 10-28 develops left arm weakness, numbness. Stat MRI was ordered, which confirm recurrent hematoma. Patient was taken to OR for hematoma evacuation 10-28. He has been off anticoagulation since 10-25. For his thrombocytopenia, a Bone marrow biopsy was done. For LE DVT, he has IVC filter. He cant not be on anticoagulation due to recurrent bleeding.  Patient has received multiple platelet transfusions.  Platelet counts remain stable.  Dose of dexamethasone increased by neurosurgery.    Assessment & Plan:   Principal Problem:   Fever Active Problems:   Myelopathy (HCC)   Benign essential HTN   Cord compression (HCC)   History of pulmonary embolism   Spondylosis, cervical, with myelopathy   Labile blood pressure   Orthostatic hypotension   Leukocytosis   Tachycardia-bradycardia syndrome (HCC)   Adjustment disorder with mixed anxiety and depressed mood     Cervical spine stenosis with lower extremity weakness -Patient was noted to have left-sided weakness and numbness. Status post cervical decompressive surgery on 06/14/2018 -Status post evacuation of large hematoma and placement of Hemovac drain on 06/22/2018 -Patient with decreased left sided hand grip and weakness on 10/19; MRI C-spine showed large epidural hematoma -S/p OR for clot evacuation on 10/19 and 10-25 and 10-28 -Patient has required multiples surgical evacuation of epidural hematoma. Difficult  situation with patient recent thrombotic  event. Neurosurgery continues to follow.  Defer management to them.  He remains on dexamethasone.  Drains were removed on 11/3.  Dexamethasone dose has been increased by neurosurgery to see if that would help his strength on the left side.  Normocytic anemia Low hemoglobin noted on 11/2 was most likely lab error.  Patient was transfused 1 unit of blood.  Subsequent hemoglobins have been stable.  No evidence for any overt bleeding.  CT scan of the chest abdomen pelvis did not show any bleeding.    Thrombocytopenia/coagulopathy -Cardiolipin antibody negative -Lupus anticoagulant, homocysteine level, factor VIII within normal limits -Protein S activity 60 (low) -Protein C total >200; Activity 199 (high)  -HIT panel 0.486 (mildly elevated)- SRA (serotonin release assay) negative -immature platelet fraction WNL -Was given 2u platelets on 10/19 in the OR. And 2 units 10-25 for OR. Platelet transfusion 10/29 Patient transfused platelets and given vitamin K on 10/30. Patient underwent bone marrow biopsy on 10/30.  Pathology report suggests nonspecific findings.  Hematology suspects that some of his coagulopathy and platelet low platelet counts are likely due to medications.  Platelet count remains stable but low.    Near syncope due to orthostatic hypotension Thought to be related to hypovolemia and autonomic dysfunction.  Echocardiogram July 2019 showed a normal systolic function, grade 2 diastolic dysfunction.  No significant valve abnormalities noted. Patient was placed on midodrine.  Midodrine dose was decreased due to bradycardia.  Abdominal binder.    Sinus bradycardia Patient initially developed bradycardia on 10/23.  He was asymptomatic.  TSH was 1.24.  Cardiology was consulted at that time.  There was no indication for pacemaker.  His heart rate stabilized and improved.  He again became bradycardic on 11/1.  EKG shows sinus bradycardia.  Telemetry did  not show any pauses.  Dose of midodrine was decreased as midodrine can sometimes cause bradycardia.  Autonomic dysfunction is another possible etiology.  Heart rate has been stable for the last 72 hours.  Continue to monitor on telemetry.  Avoid AV nodal blocking agents.  Polyarthralgia/persistent fever -At the time of admission patient had symptoms in the shoulders and ankles, along with a history of gout and pain in his knees.  He underwent arthrocentesis however no evidence of infection was found. -He was noted to have elevated inflammatory markers including CCP, CRP.  However ANCA, aldolase, ANA were negative. -Previous rounding physicians have discussed with Dr. Kathlene November, rheumatologist, he will follow-up with the patient as an outpatient. -Patient was placed on prednisone however currently on dexamethasone (neurosurgery) Fever has resolved.  He was noted to be on doxycycline since 10/18.  Reason is not entirely clear.  Discontinued on 10/31.  Remains afebrile.  Recent PE /DVT in setting of spine surgery -Status post IVC filter placement on March 28, 2018 -Patient was on apixaban and changed over to IV heparin.  Anticoagulation however was discontinued for surgery.  Patient also noted to have thrombocytopenia. -Vascular DVT US 06/26/2018 showed bilateral lower extremity DVTs likely acute -Case was discussed with  vascular surgery, Dr. Trula Slade.  Also discussed with interventional radiology.  Unfortunately patient does not have any good options vis--vis either thrombolysis or mechanical thrombectomy. -Bivalirudin discontinue by neurosurgery. Serotonin release assay negative.  Remains off of anticoagulation due to bleeding episodes.  Will need neurosurgery input before anti-coagulation can be reconsidered. Patient to be given an additional dose of Lasix today for upper extremity swelling.  No DVT noted on previously done ultrasound.  Gout Stable.  Continue Uloric  Mild acute renal failure This  has resolved.  Renal function is normal.  Electrolytes are normal.  Essential hypertension  Amlodipine discontinue due to orthostatic hypotension and edema.  Blood pressure remains stable.  Some degree of hypertension likely due to midodrine.  Hyperglycemia; from steroids.  CBGs remain elevated but better than before.  Continue current dose of Lantus.  Monitor CBGs.  HbA1c 5.8.  Hyperglycemia most likely due to steroids.    Adjustment disorder with depression and anxiety Patient noted to be very depressed over the last few days.  No suicidal ideation.  Psychiatry was consulted.  They recommended changing Xanax to Klonopin on a scheduled basis.  They also recommended sertraline.  We will change his Xanax to Klonopin.  However we will hold off on sertraline due to his hematological issues and rhythm issues.  Abnormal CT findings CT scan raised concern for pneumonia but patient does not have any respiratory symptoms.  He is afebrile.  Normal WBC.  He had been on Ancef for several days for prophylaxis against the drains.  Not on it currently.  Again does not have any respiratory symptoms.  No need to further pursue this issue at this time.  Gallstones noted as well.  Results were communicated to the patient.  DVT prophylaxis: TED's.  No anticoagulants due to frequent epidural bleeding Code Status: full code.  Family Communication: discussed with patient,  Disposition Plan: Patient remains stable.  Neurosurgery is following closely.  Continues to have left-sided weakness.  Dose of dexamethasone increased by neurosurgery.  Clinical condition remains tenuous.  Physical and occupational therapy is working with the patient.      Consultants:   Neurosurgery  Hematology  Cardiology    Procedures: On 10/2 1. posterior cervical decompressive laminectomy at C3, C4, C5 with removal of the spinous process and complete laminectomy and foraminotomies of the C3, C4, C5 nerve roots. 2. Posterior  cervical fusion with lateral mass screws at C3-C4-C5 and pars screws at C2 lies in the globus ellipseLateral mass screw system 3. Posterior lateral arthrodesis C2-C5 utilizing locally harvested autograft mixed with vivigen  06/22/2018: Procedure reexploration posterior cervical wound for evacuation of epidural hematoma  06/26/2018 DVT ultrasound:Right: Findings consistent with acute deep vein thrombosis involving the right common femoral vein, right femoral vein, right popliteal vein, right posterior tibial vein, and right peroneal vein. Thrombosis extends proximally into external iliac vein.  Proximal iliac vein not visualized. Left: Findings consistent with acute deep vein thrombosis involving the left common femoral vein, left femoral vein, left popliteal vein, left posterior tibial vein, and left peroneal vein. Findings consistent with acute superficial vein thrombosis  involving the left great saphenous vein. Thrombosis extends proximally into external iliac vein. Proximal iliac vein not visualized.  07/01/18 Clot evacuation   RUE doppler 07/02/18    Antimicrobials:  Ancef.   Subjective: Patient continues to feel frustrated by the swelling in his arms.  He is also discouraged by the fact that he has not gained any strength in the left side.  Denies any chest pain or shortness of breath.  Objective: Vitals:   07/17/18 1922 07/17/18 2303 07/18/18 0600 07/18/18 0700  BP: (!) 141/96 (!) 143/98    Pulse:   (!) 57   Resp:      Temp: 97.9 F (36.6 C) 97.8 F (36.6 C)  98.2 F (36.8 C)  TempSrc: Oral Oral    SpO2:   98%   Weight:   135 kg   Height:  Intake/Output Summary (Last 24 hours) at 07/18/2018 0945 Last data filed at 07/18/2018 0841 Gross per 24 hour  Intake 480 ml  Output 1750 ml  Net -1270 ml   Filed Weights   06/22/18 1641 07/07/18 1000 07/18/18 0600  Weight: 123.8 kg 123.8 kg 135 kg    Examination:  General exam: Awake alert.  In no  distress Respiratory system: Normal effort at rest.  Clear to auscultation bilaterally. Cardiovascular system: Sinus bradycardia noted on telemetry.  Heart rate has improved and mostly in the 60s and 70s now.  S1-S2 is normal regular. Gastrointestinal system: Abdomen soft.  Nontender nondistended. Improved edema in the upper extremities    Data Reviewed: I have personally reviewed following labs and imaging studies  CBC: Recent Labs  Lab 07/14/18 1312 07/15/18 0514 07/15/18 1059 07/16/18 0559 07/17/18 0446 07/18/18 0500  WBC 5.9 4.5 7.4 6.2 6.1 7.6  NEUTROABS 5.2  --   --  5.5 5.4 6.7  HGB 8.2* 5.7* 10.7* 10.2* 10.1* 10.5*  HCT 25.2* 18.6* 33.4* 32.1* 31.8* 33.6*  MCV 94.7 98.4 94.6 95.5 96.4 94.9  PLT 53* 37* 58* 66* 53* 53*   Basic Metabolic Panel: Recent Labs  Lab 07/15/18 0514 07/15/18 1059 07/16/18 0547 07/17/18 0446 07/18/18 0500  NA QUESTIONABLE RESULTS, RECOMMEND RECOLLECT TO VERIFY 140 137 139 137  K QUESTIONABLE RESULTS, RECOMMEND RECOLLECT TO VERIFY 4.0 4.2 4.2 4.3  CL QUESTIONABLE RESULTS, RECOMMEND RECOLLECT TO VERIFY 107 105 107 104  CO2 QUESTIONABLE RESULTS, RECOMMEND RECOLLECT TO VERIFY _0 GLUCOSE QUESTIONABLE RESULTS, RECOMMEND RECOLLECT TO VERIFY 279* 284* 197* 255*  BUN QUESTIONABLE RESULTS, RECOMMEND RECOLLECT TO VERIFY 32* 33* 30* 29*  CREATININE QUESTIONABLE RESULTS, RECOMMEND RECOLLECT TO VERIFY 0.76 0.70 0.66 0.63  CALCIUM QUESTIONABLE RESULTS, RECOMMEND RECOLLECT TO VERIFY 9.1 8.7* 8.8* 8.8*   GFR: Estimated Creatinine Clearance: 147.3 mL/min (by C-G formula based on SCr of 0.63 mg/dL).  Coagulation Profile: Recent Labs  Lab 07/12/18 0500 07/13/18 0630 07/14/18 1312  INR 1.29 1.31 1.36   CBG: Recent Labs  Lab 07/17/18 0741 07/17/18 1200 07/17/18 1613 07/17/18 2134 07/18/18 0719  GLUCAP 161* 137* 188* 192* 254*      Radiology Studies: No results found.      Scheduled Meds: . clonazePAM  0.5 mg Oral BID  .  dexamethasone  10 mg Intravenous Q6H  . docusate sodium  100 mg Oral Daily  . gabapentin  300 mg Oral TID  . Gerhardt's butt cream   Topical BID  . Influenza vac split quadrivalent PF  0.5 mL Intramuscular Tomorrow-1000  . insulin aspart  0-5 Units Subcutaneous QHS  . insulin aspart  0-9 Units Subcutaneous TID WC  . insulin glargine  30 Units Subcutaneous QHS  . magic mouthwash w/lidocaine  5 mL Oral TID  . methocarbamol  750 mg Oral QID  . midodrine  5 mg Oral BID WC  . nystatin  5 mL Oral TID AC & HS  . senna-docusate  1 tablet Oral QHS  . sodium chloride flush  10-40 mL Intracatheter Q12H   Continuous Infusions: . sodium chloride    . famotidine (PEPCID) IV    . lactated ringers 10 mL/hr at 07/10/18 0900     LOS: 41 days     Bonnielee Haff, MD [facility] Pager [phone]  If 7PM-7AM, please contact night-coverage [email or web address] Password [email or web address] 07/18/2018, 9:45 AM

## 2018-07-18 NOTE — Progress Notes (Signed)
   07/18/18 1200  Clinical Encounter Type  Visited With Patient and family together  Visit Type Initial  Referral From Nurse  Consult/Referral To Chaplain  Spiritual Encounters  Spiritual Needs Other (Comment)  Stress Factors  Patient Stress Factors None identified  Family Stress Factors None identified   Was on floor and Nurse requested a visit for PT request for an AD. PT was alert and family was at bedside. I offered an overview of AD. Also, I offered spiritual care with words of encouragement and ministry of presence.  Chaplain Orest Dikes  [phone]

## 2018-07-18 NOTE — Progress Notes (Signed)
Mr. Kmetz is about the same.  Does not look like there is any obvious bleeding.  The surgical site on the neck appears to be holding pretty steady.  He has a lot of fluid in him.  He has been getting quite a bit of fluid.  His albumin is low.  May be, he might feel a little bit better if we got some of the fluid off of him.  I talked him about this.  We will try giving him some IV albumin followed by some Lasix.  His labs show a white cell count 7.6.  Hemoglobin 10.5.  Platelet count 53,000.  LDH is 331.  His creatinine 0.63.  I think physical therapy is trying to help him.  He really cannot move his left leg.  He says he has a little bit more movement in the left arm.  I still am not sure exactly as to why he had the bleeding.  I know that his fibrinogen level was low.  We did give him some cryoprecipitate.  I will recheck a fibrinogen level on him.  His appetite is marginal.  I know that there has been a psychological component to what is going on with him.  I can certainly appreciate the melancholy that he has given that he has been in the hospital for such a long time and that he has had multiple surgeries and that he has had multiple bleeds.  I appreciate all the help that he has been getting.  I know all the staff up on 4 NP have done a great job with him.  [name], MD  Deuteronomy 31:6

## 2018-07-19 LAB — CBC WITH DIFFERENTIAL/PLATELET
ABS IMMATURE GRANULOCYTES: 0.1 10*3/uL — AB (ref 0.00–0.07)
Basophils Absolute: 0 10*3/uL (ref 0.0–0.1)
Basophils Relative: 0 %
Eosinophils Absolute: 0 10*3/uL (ref 0.0–0.5)
Eosinophils Relative: 0 %
HCT: 30.7 % — ABNORMAL LOW (ref 39.0–52.0)
HEMOGLOBIN: 9.9 g/dL — AB (ref 13.0–17.0)
IMMATURE GRANULOCYTES: 1 %
LYMPHS PCT: 5 %
Lymphs Abs: 0.4 10*3/uL — ABNORMAL LOW (ref 0.7–4.0)
MCH: 29.8 pg (ref 26.0–34.0)
MCHC: 32.2 g/dL (ref 30.0–36.0)
MCV: 92.5 fL (ref 80.0–100.0)
MONO ABS: 0.3 10*3/uL (ref 0.1–1.0)
Monocytes Relative: 4 %
NEUTROS PCT: 90 %
Neutro Abs: 7 10*3/uL (ref 1.7–7.7)
Platelets: 44 10*3/uL — ABNORMAL LOW (ref 150–400)
RBC: 3.32 MIL/uL — AB (ref 4.22–5.81)
RDW: 17 % — ABNORMAL HIGH (ref 11.5–15.5)
WBC: 7.8 10*3/uL (ref 4.0–10.5)
nRBC: 0 % (ref 0.0–0.2)

## 2018-07-19 LAB — GLUCOSE, CAPILLARY
GLUCOSE-CAPILLARY: 232 mg/dL — AB (ref 70–99)
GLUCOSE-CAPILLARY: 307 mg/dL — AB (ref 70–99)
Glucose-Capillary: 390 mg/dL — ABNORMAL HIGH (ref 70–99)
Glucose-Capillary: 350 mg/dL — ABNORMAL HIGH (ref 70–99)

## 2018-07-19 LAB — PROTIME-INR
INR: 1.23
PROTHROMBIN TIME: 15.4 s — AB (ref 11.4–15.2)

## 2018-07-19 LAB — FIBRINOGEN: FIBRINOGEN: 147 mg/dL — AB (ref 210–475)

## 2018-07-19 LAB — LACTATE DEHYDROGENASE: LDH: 290 U/L — ABNORMAL HIGH (ref 98–192)

## 2018-07-19 LAB — APTT: aPTT: 23 seconds — ABNORMAL LOW (ref 24–36)

## 2018-07-19 MED ORDER — FUROSEMIDE 10 MG/ML IJ SOLN
60.0000 mg | Freq: Two times a day (BID) | INTRAMUSCULAR | Status: DC
  Administered 2018-07-20 – 2018-07-23 (×7): 60 mg via INTRAVENOUS
  Filled 2018-07-19 (×7): qty 6

## 2018-07-19 MED ORDER — DEXAMETHASONE SODIUM PHOSPHATE 10 MG/ML IJ SOLN
8.0000 mg | Freq: Four times a day (QID) | INTRAMUSCULAR | Status: DC
  Administered 2018-07-19 – 2018-07-20 (×3): 8 mg via INTRAVENOUS
  Filled 2018-07-19 (×4): qty 1

## 2018-07-19 MED ORDER — FUROSEMIDE 10 MG/ML IJ SOLN
60.0000 mg | Freq: Once | INTRAMUSCULAR | Status: AC
  Administered 2018-07-19: 60 mg via INTRAVENOUS
  Filled 2018-07-19: qty 6

## 2018-07-19 NOTE — Progress Notes (Signed)
Occupational Therapy Treatment Patient Details Name: [name] [name] MRN: [id number] DOB: 1965/11/04 Today's Date: 07/19/2018    History of present illness 52 y.o. male admitted on 06/06/18 for bil LE wekness and muscle spasm after multiple falls (with resultant R knee meniscus tear placed in bledsoe brace) following a recent ACDF at C3 in July 2019 (complicated by PE (s/p IVC filter), CIR, and then SNF placement before finally returning home with his parents).  In the ED MRI of c-spine which showed post operative changes and cord edema and possibility of ischemia.  His R knee was aspirated on 06/09/18.  S/p posterior cervical decompression and fusion on 06/14/18. Entire acute course complicated by hypotension in standing (thought to be autonomic) and by finding of multiple LE DVTs (already has an IVC filter in place, but per MDs notes at risk for blocking the filter with these clots).  Pt with post op issues with incisional bleeding s/p 2 evacuations of hematomas and placement of hemovac (06/22/18 and 07/01/18) as well as multiple units of plasma.  Cardiology consulted 07/05/18 due to bradycardia thought to be from his newly started blood thinner.  Pt with another re-accumulation of epidural hematoma with increased L sided weakness and cord compression with emergent evacuation surgery 10/25 & 07/10/18.  Pt also needing more platelets post op.  Bone marrow bx 07/12/18. PMHx of HTN, gout, anemia, DDD (lumbar)   OT comments  Upon arrival, pt attempting to self-feed and presenting with poor grasp. Optimizing pt positioning in bed and elevating left elbow to optimize LUE movement. Providing increased build up handle and adapted spoon for self feeding. Pt with decreased engagement and stating "I can't do anything." Continue to recommend SNF and will continue to follow acutely as admitted.    Follow Up Recommendations  SNF;Supervision/Assistance - 24 hour    Equipment Recommendations  Other (comment)     Recommendations for Other Services Rehab consult    Precautions / Restrictions Precautions Precautions: Fall;Cervical Precaution Comments: hemovac Required Braces or Orthoses: Cervical Brace Cervical Brace: Hard collar;At all times Other Brace/Splint: Use locked bledsoe RLE when and if he can start attempting to stand again.   Restrictions Weight Bearing Restrictions: (P) No       Mobility Bed Mobility Overal bed mobility: Needs Assistance Bed Mobility: Rolling Rolling: +2 for physical assistance;Total assist         General bed mobility comments: Total assist +2 to roll bil for pericare and lift pad placement.  Pt able to attempt to assist with gripping and hooking rail on his left side, not much ability to help on the right side due to significant weakness of the left arm.  Transfers Overall transfer level: Needs assistance               General transfer comment: Transferred OOB to chair for upright posture and mental break from the bed.  PT is working with inpatient rehab on if they have  chair that is appropriate for him so that we may be able to progress to out of room.     Balance                                           ADL either performed or assessed with clinical judgement   ADL Overall ADL's : Needs assistance/impaired Eating/Feeding: Set up;Sitting;With adaptive utensils Eating/Feeding Details (indicate cue type and reason): Upon arrival,  pt with difficulty eating lunch and attempting to pull tray closer to himself. Optimized pt position in bed for self feeding and lowered table. Pt continue to present with decreased grasp strength. Adapting built up handle (from prior session) to optimzie independence. Also creating built up handle with enlarged spoon for scooping cut spaghetti.                                   General ADL Comments:  Pt defeated today and difficult to engage. Pt continuing to report "I have no strength.  "I have no grasp." "I am so swollen" "I can't do anything" "No one is listening to me"     Vision       Perception     Praxis      Cognition Arousal/Alertness: Awake/alert Behavior During Therapy: Flat affect Overall Cognitive Status: Within Functional Limits for tasks assessed Area of Impairment: Memory                     Memory: Decreased short-term memory         General Comments: Very flat, defeated today.        Exercises    Shoulder Instructions       General Comments NT and MD present during session    Pertinent Vitals/ Pain       Pain Assessment: Faces Faces Pain Scale: Hurts little more Pain Location: bil LEs and UEs, posterior neck.  Pt reports all of the edema is uncomfortable.  Pain Descriptors / Indicators: Aching;Constant Pain Intervention(s): Limited activity within patient's tolerance;Monitored during session;Repositioned  Home Living                                          Prior Functioning/Environment              Frequency  Min 2X/week        Progress Toward Goals  OT Goals(current goals can now be found in the care plan section)  Progress towards OT goals: Not progressing toward goals - comment  Acute Rehab OT Goals Patient Stated Goal: to have answers, know a plan, get better OT Goal Formulation: With patient Time For Goal Achievement: 07/17/18 Potential to Achieve Goals: Good ADL Goals Pt Will Perform Eating: with modified independence;with adaptive utensils;sitting Pt Will Perform Grooming: with min guard assist;sitting Pt Will Perform Upper Body Dressing: with min assist;sitting Pt Will Perform Lower Body Dressing: with mod assist;sitting/lateral leans;with adaptive equipment Pt Will Transfer to Toilet: with mod assist;bedside commode;stand pivot transfer;with transfer board Pt Will Perform Toileting - Clothing Manipulation and hygiene: with mod assist;sitting/lateral leans;sit to/from  stand Pt/caregiver will Perform Home Exercise Program: Increased ROM;Increased strength;Both right and left upper extremity;With theraputty;With written HEP provided Additional ADL Goal #1: Pt will perform bed mobility with modA as precursor to EOB/OOB activity. Additional ADL Goal #2: Pt will maintain static sitting EOB with minguard assist during ADL task.  Plan Discharge plan remains appropriate    Co-evaluation                 AM-PAC PT "6 Clicks" Daily Activity     Outcome Measure   Help from another person eating meals?: A Lot Help from another person taking care of personal grooming?: A Lot Help from another person toileting, which includes  using toliet, bedpan, or urinal?: Total Help from another person bathing (including washing, rinsing, drying)?: A Lot Help from another person to put on and taking off regular upper body clothing?: A Lot Help from another person to put on and taking off regular lower body clothing?: Total 6 Click Score: 10    End of Session Equipment Utilized During Treatment: Cervical collar(Adpative built-up handles and feeding cuffs.)  OT Visit Diagnosis: Other abnormalities of gait and mobility (R26.89);Muscle weakness (generalized) (M62.81);Pain Pain - Right/Left: Right Pain - part of body: Leg   Activity Tolerance Patient tolerated treatment well   Patient Left with call bell/phone within reach;in bed;with nursing/sitter in room   Nurse Communication Mobility status;Need for lift equipment        Time: 1310-1332 OT Time Calculation (min): 22 min  Charges: OT General Charges $OT Visit: 1 Visit OT Treatments $Self Care/Home Management : 8-22 mins  Shalon Salado MSOT, OTR/L Acute Rehab Pager: [phone] Office: [phone]   Theodoro Grist Meda Dudzinski 07/19/2018, 5:37 PM

## 2018-07-19 NOTE — Progress Notes (Signed)
RN currently at patient's bedside to complete am rounds. Patient awake watching movies on his phone. Patient denied having needs at this time, RN reassured patient she would return soon.

## 2018-07-19 NOTE — Progress Notes (Addendum)
Inpatient Diabetes Program Recommendations  AACE/ADA: New Consensus Statement on Inpatient Glycemic Control (2015)  Target Ranges:  Prepandial:   less than 140 mg/dL      Peak postprandial:   less than 180 mg/dL (1-2 hours)      Critically ill patients:  140 - 180 mg/dL   Results for LADALE, SHERBURN (MRN [id number]) as of 07/19/2018 08:30  Ref. Range 07/17/2018 07:41 07/17/2018 12:00 07/17/2018 16:13 07/17/2018 21:34  Glucose-Capillary Latest Ref Range: 70 - 99 mg/dL 409 (H)  2 units NOVOLOG  137 (H)  1 unit NOVOLOG  188 (H)  2 units NOVOLOG  192 (H)    30 units LANTUS   Results for LANGDON, CROSSON (MRN [id number]) as of 07/19/2018 08:30  Ref. Range 07/18/2018 07:19 07/18/2018 12:00 07/18/2018 16:16 07/18/2018 21:37  Glucose-Capillary Latest Ref Range: 70 - 99 mg/dL 956 (H)  5 units NOVOLOG  271 (H)  5 units NOVOLOG  314 (H)  7 units NOVOLOG  253 (H)  3 units NOVOLOG +  30 units LANTUS    Diabetes history:None  Outpatient Diabetes medications:None  Current Orders: Lantus 30 units QHS      Novolog Sensitive Correction Scale/ SSI (0-9 units) TID AC + HS       Decadron increased to 10 mg Q6H on 11/04.  CBGs quite elevated likely due to Decadron.      MD- Please consider the following in-hospital insulin adjustments while patient remains on IV Decadron:  1. Increase Lantus to 35 units QHS (~20% increase)  2. Start Novolog Meal Coverage: Novolog 6 units TID with meals  (Please add the following Hold Parameters: Hold if pt eats <50% of meal, Hold if pt NPO)     --Will follow patient during hospitalization--  Ambrose Finland RN, MSN, CDE Diabetes Coordinator Inpatient Glycemic Control Team Team Pager: [phone] (8a-5p)

## 2018-07-19 NOTE — Progress Notes (Signed)
PROGRESS NOTE    [name] [name]  MRN:[id number] DOB: 12/05/1965 DOA: 06/06/2018 PCP: Howell, [name], MD    Brief Narrative: 52-year-old man with past medical history relevant for gout, hypertension anterior cervical corpectomy of C4 on 03/24/2018 with comp gated postoperative course including development of multiple PEs and DVTs started on anticoagulation and subsequently developed hematoma requiring reexploration on 03/28/2018 status post placement of IVC filter who was discharged to skilled nursing facility and had a complicated course including progressive lower extremity weakness, intermittent fevers of unclear etiology and hospitalizations for "pneumonia". Patient was  transferred from [facility], he underwent MRI of C-spine that showed postoperative changes and cord compression with cord edema and concern for ischemia. Patient is status post posterior cervical decompression laminectomy of C3, C4, C5, removal of spinous processes, foraminotomies on 06/14/2018. On 06/22/2018 patient began to develop worsening left sided paresthesias and weakness and a stat MRI showed evidence of postoperative hematoma. Patient was taken to the OR with evacuation of hematoma. Additionally his postoperative course has been complicated by recurrent fevers,bilateral lower extremity DVTs on ultrasound,migratory arthralgias/arthritis that are thought to be rheumatologic in nature as well as significant orthostasis limiting the amount he can work with physical therapy. His arthralgia has since then resolved.   Noted to have decreased left hand grip on 10/19, MRI showed large epidural hematoma, patient s/p clot evacuation.  He was place back on anticoagulation argatroban on 10-22. He was notice to be more bradycardic with HR in the 40 on 10-23. Cardiology was consulted, and argatroban was transition to bivalirrubin to avoid bradycardia. His HR improved after argatroban was discontinued. -HIT panel0.486 (mildly  elevated)- SRA (serotonin release assay) negative. Dr Ennever has been helping with patient care. He is recommending to avoid heparin or Lovenox products.   Patient developed weakness of left arm on 10-25. He was take emergently to OR for recurrence of epidural hematoma on 10-25. Anticoagulation was discontinue since 10-25. Dr Cram is recommending for patient to remain off anticoagulation for alt leas 1 week, and if anticoagulation is consider, that will need to be discuss with Dr Cram.  Patient the night of 10-28 develops left arm weakness, numbness. Stat MRI was ordered, which confirm recurrent hematoma. Patient was taken to OR for hematoma evacuation 10-28. He has been off anticoagulation since 10-25. For his thrombocytopenia, a Bone marrow biopsy was done. For LE DVT, he has IVC filter. He cant not be on anticoagulation due to recurrent bleeding.  Patient has received multiple platelet transfusions.  Platelet counts remain stable.   Pt seen and examined on 11/6: Pt appears frustrated that his strength hasn't im,proved much and his legs and hands are swollen. He wants us to decrease the steroids.    Assessment & Plan:   Principal Problem:   Fever Active Problems:   Myelopathy (HCC)   Benign essential HTN   Cord compression (HCC)   History of pulmonary embolism   Spondylosis, cervical, with myelopathy   Labile blood pressure   Orthostatic hypotension   Leukocytosis   Tachycardia-bradycardia syndrome (HCC)   Adjustment disorder with mixed anxiety and depressed mood  Cervical spine stenosis with lower extremity weakness more on the left compared to the right  S/p cervical decompressive surgery on on 06/14/2018.  S/p evacuation of large hematoma and placement of hemovac drain on 06/22/2018.  S/p clot evaluation on 10/19, 10/25, 10/28.  Neurosurgery on board and changing the dose of decadron .    Anemia of chronic disease:  Transfused 1   unit of prbc, . Keep hemoglobin around 7. No  obvious signs of bleeding.    Thrombocytopenia/ coagulopathy:  Unclear etiology.  Cardiolipin antibody is negative.  Lupus anti coagulant , homocysteine level and factor VIII WNL.  Protein c is high.  HIT panel mildly elevated. SRA negative.  He underwent a total of 5 unit s of platelet transfusion since admission.  BOne marrow biopsy done on 10/30 with non specific findings, suggesting cause possibly from medications.  Platelets continue to drop to <50,000 but greater than 40,000.    Near syncope due to orthostatic hypotension:  From autonomic dysfunction.  Echo showed normal systolic function and grade 2 diastolic dysfunction.  Recommend midodrine and abdominal binder.    Polyarthralgia: improvement with steroids.    Recent PE/ DVT in the setting of spine surgery:  Sp IVC filter placement on July 16 th 2019. Anti coagulation was discontinued due to bleeding episodes.   Hypertension:  Stable.    Hyperglycemia from steroids.  A1c is 5. 8%.    Adjustment disorder with anxiety and depression:  No signs of suicidal ideation.  Psychiatry was consulted and recommendations.    Bilateral 3+ leg and upper extremity edema: Increased lasix to 60 mg BID and monitor renal parameters while on lasix.  He was given a dose of albumin yesterday.           DVT prophylaxis: compression stockings  Code Status: full code.  Family Communication: none at bedside.  Disposition Plan: pending clinical improvement.    Consultants:   Neuro surgery   HEMATOLOGY.    Procedures:    Antimicrobials: none.    Subjective: Pt frustrated about leg edema. Arm edema. No fever or chills. No nausea, or vomiting.   Objective: Vitals:   07/18/18 2300 07/19/18 0300 07/19/18 0723 07/19/18 1143  BP: 125/66 125/81 136/74 135/82  Pulse:   61 77  Resp:   13 20  Temp: 97.7 F (36.5 C) 97.8 F (36.6 C) 97.8 F (36.6 C) 98 F (36.7 C)  TempSrc: Oral Oral Oral Oral  SpO2: 97% 97% 99%  99%  Weight:      Height:        Intake/Output Summary (Last 24 hours) at 07/19/2018 1425 Last data filed at 07/19/2018 1347 Gross per 24 hour  Intake 1200 ml  Output 3950 ml  Net -2750 ml   Filed Weights   06/22/18 1641 07/07/18 1000 07/18/18 0600  Weight: 123.8 kg 123.8 kg 135 kg    Examination:  General exam: Appears calm and comfortable  Respiratory system: Clear to auscultation. Respiratory effort normal. Cardiovascular system: S1 & S2 heard, RRR.  No pedal edema. Gastrointestinal system: Abdomen is nondistended, soft and nontender. No organomegaly or masses felt. Normal bowel sounds heard. Central nervous system: Alert and oriented. Extremities: 3+ leg edema, 2+ edema of the left arm.  Skin: No rashes, lesions or ulcers Psychiatry: Mood & affect appropriate.     Data Reviewed: I have personally reviewed following labs and imaging studies  CBC: Recent Labs  Lab 07/14/18 1312  07/15/18 1059 07/16/18 0559 07/17/18 0446 07/18/18 0500 07/19/18 0535  WBC 5.9   < > 7.4 6.2 6.1 7.6 7.8  NEUTROABS 5.2  --   --  5.5 5.4 6.7 7.0  HGB 8.2*   < > 10.7* 10.2* 10.1* 10.5* 9.9*  HCT 25.2*   < > 33.4* 32.1* 31.8* 33.6* 30.7*  MCV 94.7   < > 94.6 95.5 96.4 94.9 92.5  PLT 53*   < >  58* 66* 53* 53* 44*   < > = values in this interval not displayed.   Basic Metabolic Panel: Recent Labs  Lab 07/15/18 0514 07/15/18 1059 07/16/18 0547 07/17/18 0446 07/18/18 0500  NA QUESTIONABLE RESULTS, RECOMMEND RECOLLECT TO VERIFY 140 137 139 137  K QUESTIONABLE RESULTS, RECOMMEND RECOLLECT TO VERIFY 4.0 4.2 4.2 4.3  CL QUESTIONABLE RESULTS, RECOMMEND RECOLLECT TO VERIFY 107 105 107 104  CO2 QUESTIONABLE RESULTS, RECOMMEND RECOLLECT TO VERIFY _0 GLUCOSE QUESTIONABLE RESULTS, RECOMMEND RECOLLECT TO VERIFY 279* 284* 197* 255*  BUN QUESTIONABLE RESULTS, RECOMMEND RECOLLECT TO VERIFY 32* 33* 30* 29*  CREATININE QUESTIONABLE RESULTS, RECOMMEND RECOLLECT TO VERIFY 0.76 0.70 0.66 0.63   CALCIUM QUESTIONABLE RESULTS, RECOMMEND RECOLLECT TO VERIFY 9.1 8.7* 8.8* 8.8*   GFR: Estimated Creatinine Clearance: 147.3 mL/min (by C-G formula based on SCr of 0.63 mg/dL). Liver Function Tests: Recent Labs  Lab 07/15/18 0514 07/15/18 1059 07/16/18 0547 07/17/18 0446 07/18/18 0500  AST QUESTIONABLE RESULTS, RECOMMEND RECOLLECT TO VERIFY _1 ALT QUESTIONABLE RESULTS, RECOMMEND RECOLLECT TO VERIFY _2 ALKPHOS QUESTIONABLE RESULTS, RECOMMEND RECOLLECT TO VERIFY 59 77 69 59  BILITOT QUESTIONABLE RESULTS, RECOMMEND RECOLLECT TO VERIFY 0.7 0.6 0.5 0.6  PROT QUESTIONABLE RESULTS, RECOMMEND RECOLLECT TO VERIFY 5.3* 4.9* 4.8* 4.8*  ALBUMIN QUESTIONABLE RESULTS, RECOMMEND RECOLLECT TO VERIFY 2.8* 2.6* 2.5* 2.6*   No results for input(s): LIPASE, AMYLASE in the last 168 hours. No results for input(s): AMMONIA in the last 168 hours. Coagulation Profile: Recent Labs  Lab 07/13/18 0630 07/14/18 1312 07/19/18 0535  INR 1.31 1.36 1.23   Cardiac Enzymes: No results for input(s): CKTOTAL, CKMB, CKMBINDEX, TROPONINI in the last 168 hours. BNP (last 3 results) No results for input(s): PROBNP in the last 8760 hours. HbA1C: No results for input(s): HGBA1C in the last 72 hours. CBG: Recent Labs  Lab 07/18/18 1200 07/18/18 1616 07/18/18 2137 07/19/18 0727 07/19/18 1147  GLUCAP 271* 314* 253* 232* 307*   Lipid Profile: No results for input(s): CHOL, HDL, LDLCALC, TRIG, CHOLHDL, LDLDIRECT in the last 72 hours. Thyroid Function Tests: No results for input(s): TSH, T4TOTAL, FREET4, T3FREE, THYROIDAB in the last 72 hours. Anemia Panel: No results for input(s): VITAMINB12, FOLATE, FERRITIN, TIBC, IRON, RETICCTPCT in the last 72 hours. Sepsis Labs: No results for input(s): PROCALCITON, LATICACIDVEN in the last 168 hours.  No results found for this or any previous visit (from the past 240 hour(s)).       Radiology Studies: No results found.      Scheduled  Meds: . clonazePAM  0.5 mg Oral BID  . dexamethasone  8 mg Intravenous Q6H  . docusate sodium  100 mg Oral Daily  . gabapentin  300 mg Oral TID  . Gerhardt's butt cream   Topical BID  . Influenza vac split quadrivalent PF  0.5 mL Intramuscular Tomorrow-1000  . insulin aspart  0-5 Units Subcutaneous QHS  . insulin aspart  0-9 Units Subcutaneous TID WC  . insulin glargine  30 Units Subcutaneous QHS  . magic mouthwash w/lidocaine  5 mL Oral TID  . methocarbamol  750 mg Oral QID  . midodrine  5 mg Oral BID WC  . nystatin  5 mL Oral TID AC & HS  . senna-docusate  1 tablet Oral QHS  . sodium chloride flush  10-40 mL Intracatheter Q12H   Continuous Infusions: . sodium chloride    . famotidine (PEPCID) IV    . lactated ringers 10  mL/hr at 07/10/18 0900     LOS: 42 days    Time spent: 34 minutes.     Hosie Poisson, MD [facility] Pager [phone]  If 7PM-7AM, please contact night-coverage [email or web address] Password [email or web address] 07/19/2018, 2:25 PM

## 2018-07-19 NOTE — Progress Notes (Signed)
Physical Therapy Treatment Patient Details Name: [name] [name] MRN: [id number] DOB: 1966/07/14 Today's Date: 07/19/2018    History of Present Illness 52 y.o. male admitted on 06/06/18 for bil LE wekness and muscle spasm after multiple falls (with resultant R knee meniscus tear placed in bledsoe brace) following a recent ACDF at C3 in July 2019 (complicated by PE (s/p IVC filter), CIR, and then SNF placement before finally returning home with his parents).  In the ED MRI of c-spine which showed post operative changes and cord edema and possibility of ischemia.  His R knee was aspirated on 06/09/18.  S/p posterior cervical decompression and fusion on 06/14/18. Entire acute course complicated by hypotension in standing (thought to be autonomic) and by finding of multiple LE DVTs (already has an IVC filter in place, but per MDs notes at risk for blocking the filter with these clots).  Pt with post op issues with incisional bleeding s/p 2 evacuations of hematomas and placement of hemovac (06/22/18 and 07/01/18) as well as multiple units of plasma.  Cardiology consulted 07/05/18 due to bradycardia thought to be from his newly started blood thinner.  Pt with another re-accumulation of epidural hematoma with increased L sided weakness and cord compression with emergent evacuation surgery 10/25 & 07/10/18.  Pt also needing more platelets post op.  Bone marrow bx 07/12/18 with no significant findings. PMHx of HTN, gout, anemia, DDD (lumbar)    PT Comments    Pt very depressed mood today. He was frustrated earlier when working on self feeding.  The edema in his hands is making it difficult.  He was compliant with my request to mobilize OOB to chair, but was not particularly participatory or engaged due to mood/affect.  He did perk up after getting him cleaned up from a BM and over to the chair.  I would like to continue to work on sitting balance EOB next session (when engagement may be improved) and see if I can  work with our inpatient rehab unit on getting him a WC he could use to get out of his room/outside.     Follow Up Recommendations  [facility]     Equipment Recommendations  Hospital bed;Wheelchair cushion (measurements PT);Wheelchair (measurements PT);Other (comment)(22x22 tilt in space power chair with power recline)    Recommendations for Other Services   NA     Precautions / Restrictions Precautions Precautions: Fall;Cervical Required Braces or Orthoses: Cervical Brace Cervical Brace: Hard collar;At all times    Mobility  Bed Mobility Overal bed mobility: Needs Assistance Bed Mobility: Rolling Rolling: +2 for physical assistance;Total assist         General bed mobility comments: Total assist +2 to roll bil for pericare and lift pad placement.  Pt able to attempt to assist with gripping and hooking rail on his left side, not much ability to help on the right side due to significant weakness of the left arm.  Transfers Overall transfer level: Needs assistance               General transfer comment: Transferred OOB to chair for upright posture and mental break from the bed.  PT is working with inpatient rehab on if they have  chair that is appropriate for him so that we may be able to progress to out of room.          Cognition Arousal/Alertness: Awake/alert Behavior During Therapy: Flat affect Overall Cognitive Status: Impaired/Different from baseline Area of Impairment: Memory  Memory: Decreased short-term memory         General Comments: Very flat, defeated today.         General Comments General comments (skin integrity, edema, etc.): Pt with painful LE ROM/mobility due to edema.  ROM at ankle, knee and hip remain intact.  Pt is getting visible muscle wasting in his legs along with edema all 4 extremities and his face.       Pertinent Vitals/Pain Pain Assessment: Faces Faces Pain Scale: Hurts even more Pain Location: bil  LEs and UEs, posterior neck.  Pt reports all of the edema is uncomfortable.  Pain Descriptors / Indicators: Grimacing;Guarding Pain Intervention(s): Limited activity within patient's tolerance;Monitored during session;Repositioned           PT Goals (current goals can now be found in the care plan section) Acute Rehab PT Goals Patient Stated Goal: to have answers, know a plan, get better Progress towards PT goals: Not progressing toward goals - comment(very slow to progress)    Frequency    Min 2X/week      PT Plan Current plan remains appropriate       AM-PAC PT "6 Clicks" Daily Activity  Outcome Measure  Difficulty turning over in bed (including adjusting bedclothes, sheets and blankets)?: Unable Difficulty moving from lying on back to sitting on the side of the bed? : Unable Difficulty sitting down on and standing up from a chair with arms (e.g., wheelchair, bedside commode, etc,.)?: Unable Help needed moving to and from a bed to chair (including a wheelchair)?: Total Help needed walking in hospital room?: Total Help needed climbing 3-5 steps with a railing? : Total 6 Click Score: 6    End of Session Equipment Utilized During Treatment: Cervical collar Activity Tolerance: Other (comment)(limited by low mood/affect) Patient left: in chair;with call bell/phone within reach   PT Visit Diagnosis: Other abnormalities of gait and mobility (R26.89);Other symptoms and signs involving the nervous system (R29.898);Muscle weakness (generalized) (M62.81)     Time: 1610-9604 PT Time Calculation (min) (ACUTE ONLY): 60 min  Charges:  $Therapeutic Activity: 53-67 mins          Chinmayi Rumer B. Ilean Spradlin, PT, DPT  Acute Rehabilitation [phone] pager #[phone] office            07/19/2018, 4:32 PM

## 2018-07-19 NOTE — Progress Notes (Signed)
Patient ID: [name] [name], male   DOB: 02/14/1966, 52 y.o.   MRN: [id number] Patient is doing okay with extremely frustrated and depressed with the inability to grip and feed himself.  I think we sent some neurologic improvement with increased left lower extremity movement and slightly increased grip strength from 48 hours ago. Incision clean dry and intact  Recommend continued medical management working on his coagulopathy and also patient probably needs additional diuresis. At one point yesterday heard that they were considering continuous IV infusion of Lasix not sure if that still being considered or not. It may go a long way potential to pull some fluid off but also to the patient's mental status to hit him aggressively with diuretics.

## 2018-07-19 NOTE — Progress Notes (Signed)
Handoff completed

## 2018-07-19 NOTE — Progress Notes (Signed)
IV Consult order placed for team to look at PICC. Red line unable to flush or pull back.

## 2018-07-19 NOTE — Plan of Care (Signed)
  Problem: Education: Goal: Knowledge of General Education information will improve Description: Including pain rating scale, medication(s)/side effects and non-pharmacologic comfort measures Outcome: Progressing   Problem: Health Behavior/Discharge Planning: Goal: Ability to manage health-related needs will improve Outcome: Progressing   Problem: Clinical Measurements: Goal: Ability to maintain clinical measurements within normal limits will improve Outcome: Progressing Goal: Will remain free from infection Outcome: Progressing Goal: Diagnostic test results will improve Outcome: Progressing Goal: Respiratory complications will improve Outcome: Progressing Goal: Cardiovascular complication will be avoided Outcome: Progressing   Problem: Nutrition: Goal: Adequate nutrition will be maintained Outcome: Progressing   Problem: Coping: Goal: Level of anxiety will decrease Outcome: Progressing   Problem: Elimination: Goal: Will not experience complications related to bowel motility Outcome: Progressing Goal: Will not experience complications related to urinary retention Outcome: Progressing   Problem: Safety: Goal: Ability to remain free from injury will improve Outcome: Progressing   Problem: Skin Integrity: Goal: Risk for impaired skin integrity will decrease Outcome: Progressing   

## 2018-07-20 ENCOUNTER — Encounter (HOSPITAL_COMMUNITY): Payer: Self-pay | Admitting: Hematology & Oncology

## 2018-07-20 ENCOUNTER — Inpatient Hospital Stay (HOSPITAL_COMMUNITY)

## 2018-07-20 LAB — CBC WITH DIFFERENTIAL/PLATELET
BASOS PCT: 0 %
Basophils Absolute: 0 10*3/uL (ref 0.0–0.1)
EOS PCT: 10 %
Eosinophils Absolute: 0.7 10*3/uL — ABNORMAL HIGH (ref 0.0–0.5)
HCT: 34.9 % — ABNORMAL LOW (ref 39.0–52.0)
Hemoglobin: 11.4 g/dL — ABNORMAL LOW (ref 13.0–17.0)
Lymphocytes Relative: 3 %
Lymphs Abs: 0.2 10*3/uL — ABNORMAL LOW (ref 0.7–4.0)
MCH: 30.6 pg (ref 26.0–34.0)
MCHC: 32.7 g/dL (ref 30.0–36.0)
MCV: 93.6 fL (ref 80.0–100.0)
MONO ABS: 0.2 10*3/uL (ref 0.1–1.0)
Monocytes Relative: 3 %
NEUTROS ABS: 5.5 10*3/uL (ref 1.7–7.7)
Neutrophils Relative %: 84 %
Platelets: 41 10*3/uL — ABNORMAL LOW (ref 150–400)
RBC: 3.73 MIL/uL — AB (ref 4.22–5.81)
RDW: 16.9 % — AB (ref 11.5–15.5)
WBC: 6.6 10*3/uL (ref 4.0–10.5)

## 2018-07-20 LAB — BASIC METABOLIC PANEL
ANION GAP: 8 (ref 5–15)
BUN: 38 mg/dL — ABNORMAL HIGH (ref 6–20)
CO2: 34 mmol/L — AB (ref 22–32)
Calcium: 9.5 mg/dL (ref 8.9–10.3)
Chloride: 98 mmol/L (ref 98–111)
Creatinine, Ser: 0.83 mg/dL (ref 0.61–1.24)
GFR calc Af Amer: >60 mL/min (ref >60)
GLUCOSE: 254 mg/dL — AB (ref 70–99)
POTASSIUM: 3.9 mmol/L (ref 3.5–5.1)
Sodium: 140 mmol/L (ref 135–145)

## 2018-07-20 LAB — APTT: APTT: 23 s — AB (ref 24–36)

## 2018-07-20 LAB — GLUCOSE, CAPILLARY
Glucose-Capillary: 267 mg/dL — ABNORMAL HIGH (ref 70–99)
Glucose-Capillary: 206 mg/dL — ABNORMAL HIGH (ref 70–99)
Glucose-Capillary: 276 mg/dL — ABNORMAL HIGH (ref 70–99)
Glucose-Capillary: 224 mg/dL — ABNORMAL HIGH (ref 70–99)

## 2018-07-20 LAB — LACTATE DEHYDROGENASE: LDH: 335 U/L — ABNORMAL HIGH (ref 98–192)

## 2018-07-20 MED ORDER — IPRATROPIUM-ALBUTEROL 0.5-2.5 (3) MG/3ML IN SOLN: 3.0000 mL | RESPIRATORY_TRACT | Status: DC | PRN

## 2018-07-20 MED ORDER — IPRATROPIUM-ALBUTEROL 0.5-2.5 (3) MG/3ML IN SOLN: 3.0000 mL | Freq: Two times a day (BID) | RESPIRATORY_TRACT | Status: DC

## 2018-07-20 MED ORDER — ROMIPLOSTIM 250 MCG ~~LOC~~ SOLR
250.0000 ug | SUBCUTANEOUS | Status: DC
  Administered 2018-07-20: 250 ug via SUBCUTANEOUS
  Filled 2018-07-20 (×2): qty 0.5

## 2018-07-20 MED ORDER — INSULIN ASPART 100 UNIT/ML ~~LOC~~ SOLN
3.0000 [IU] | Freq: Three times a day (TID) | SUBCUTANEOUS | Status: DC
  Administered 2018-07-20 – 2018-07-21 (×3): 3 [IU] via SUBCUTANEOUS

## 2018-07-20 MED ORDER — DEXAMETHASONE SODIUM PHOSPHATE 10 MG/ML IJ SOLN
6.0000 mg | Freq: Two times a day (BID) | INTRAMUSCULAR | Status: DC
  Administered 2018-07-20 – 2018-07-25 (×10): 6 mg via INTRAVENOUS
  Filled 2018-07-20 (×10): qty 1

## 2018-07-20 MED ORDER — SODIUM CHLORIDE 0.9% IV SOLUTION
Freq: Once | INTRAVENOUS | Status: AC
  Administered 2018-07-20: 12:00:00 via INTRAVENOUS

## 2018-07-20 MED ORDER — INSULIN ASPART 100 UNIT/ML ~~LOC~~ SOLN
0.0000 [IU] | Freq: Three times a day (TID) | SUBCUTANEOUS | Status: DC
  Administered 2018-07-20: 8 [IU] via SUBCUTANEOUS
  Administered 2018-07-21: 11 [IU] via SUBCUTANEOUS
  Administered 2018-07-21: 8 [IU] via SUBCUTANEOUS
  Administered 2018-07-21: 15 [IU] via SUBCUTANEOUS
  Administered 2018-07-22: 2 [IU] via SUBCUTANEOUS
  Administered 2018-07-22: 5 [IU] via SUBCUTANEOUS
  Administered 2018-07-22: 3 [IU] via SUBCUTANEOUS
  Administered 2018-07-23: 15 [IU] via SUBCUTANEOUS
  Administered 2018-07-23: 3 [IU] via SUBCUTANEOUS
  Administered 2018-07-23: 11 [IU] via SUBCUTANEOUS
  Administered 2018-07-24: 8 [IU] via SUBCUTANEOUS
  Administered 2018-07-24: 11 [IU] via SUBCUTANEOUS
  Administered 2018-07-24: 8 [IU] via SUBCUTANEOUS
  Administered 2018-07-25 (×2): 2 [IU] via SUBCUTANEOUS
  Administered 2018-07-25: 1 [IU] via SUBCUTANEOUS
  Administered 2018-07-26: 2 [IU] via SUBCUTANEOUS
  Administered 2018-07-26: 5 [IU] via SUBCUTANEOUS
  Administered 2018-07-26: 2 [IU] via SUBCUTANEOUS
  Administered 2018-07-27: 11 [IU] via SUBCUTANEOUS
  Administered 2018-07-27 (×2): 8 [IU] via SUBCUTANEOUS

## 2018-07-20 MED ORDER — DEXAMETHASONE SODIUM PHOSPHATE 10 MG/ML IJ SOLN: 8.0000 mg | Freq: Two times a day (BID) | INTRAMUSCULAR | Status: DC

## 2018-07-20 MED ORDER — INSULIN GLARGINE 100 UNIT/ML ~~LOC~~ SOLN
35.0000 [IU] | Freq: Every day | SUBCUTANEOUS | Status: DC
  Administered 2018-07-20: 35 [IU] via SUBCUTANEOUS
  Filled 2018-07-20 (×2): qty 0.35

## 2018-07-20 NOTE — Progress Notes (Addendum)
Patient diuresed 2,000 ml after 60 mg Lasix given at 1800.  Patient states he is feeling better even though he has developed a productive cough unable to expectorate any phylem. Patient's left foot seems to have some external rotation that a boot of some sort may help.  Patient stated when placed with pillows throughout the night diid relieve some discomfort. Lab did agree to add CMP to bloodwork already sent @0600 .

## 2018-07-20 NOTE — Progress Notes (Signed)
Results for ELIOR, ROBINETTE (MRN [id number]) as of 07/20/2018 13:23  Ref. Range 07/19/2018 11:47 07/19/2018 17:11 07/19/2018 21:39 07/20/2018 07:30 07/20/2018 11:45  Glucose-Capillary Latest Ref Range: 70 - 99 mg/dL 696 (H) 295 (H) 284 (H) 267 (H) 206 (H)  Noted that blood sugars continue to be greater than 180 mg/dl. Recommend adding Novolog 4 units TID as meal coverage to regimen if blood sugars continue to be elevated.  Novolog 4 units TID would only be given if patient eats at least 50% of meal.   Smith Mince RN BSN CDE Diabetes Coordinator Pager: [phone]  8am-5pm

## 2018-07-20 NOTE — Progress Notes (Signed)
[name] [name] is doing okay.  Thankfully, there is been no bleeding.  His fibrinogen level is still on the lower side.  I will give him another infusion of cryoprecipitate.  His platelet count was dropping.  Maybe he has an element of myelodysplasia.  I will try some Nplate to see if this can help get his platelet count up.  He is diuresing with the Lasix.  It is hard to say how much this is going to really help him.  He needs a lift to get out of the bed to a chair.  He has had no pain.  He has had a dry cough.  I would probably get a chest x-ray on him to see how this looks.  I would not think that he is volume overloaded.  May be, and incentive spirometer could help.  He has had no fever.  There is no rashes.  He has had no diarrhea.  His appetite seems to be coming back a little bit.  I wish that I could find a unifying diagnosis here.  I really cannot put the hematologic issues all together.  I am not sure if the low fibrinogen would be a issue with the bleeding.  Typically, fibrinogen needs to be below 50 for significant bleeding to occur.   I know that he is getting wonderful care from all the staff up on 4 NP unit.  [name], MD  Psalms 34:17-18  I am sure that he is going had to go to rehab.  I do not know if he would go to inpatient rehab.  We will continue to follow along.  I will try some nebulizers on him for his cough.

## 2018-07-20 NOTE — Progress Notes (Signed)
PROGRESS NOTE    [name] [name]  JKD:[id number] DOB: May 26, 1966 DOA: 06/06/2018 PCP: Helane Rima, MD    Brief Narrative: 52 year old man with past medical history relevant for gout, hypertension anterior cervical corpectomy of C4 on 03/24/2018 with comp gated postoperative course including development of multiple PEs and DVTs started on anticoagulation and subsequently developed hematoma requiring reexploration on 03/28/2018 status post placement of IVC filter who was discharged to skilled nursing facility and had a complicated course including progressive lower extremity weakness, intermittent fevers of unclear etiology and hospitalizations for "pneumonia". Patient was  transferred from [facility], he underwent MRI of C-spine that showed postoperative changes and cord compression with cord edema and concern for ischemia. Patient is status post posterior cervical decompression laminectomy of C3, C4, C5, removal of spinous processes, foraminotomies on 06/14/2018. On 06/22/2018 patient began to develop worsening left sided paresthesias and weakness and a stat MRI showed evidence of postoperative hematoma. Patient was taken to the OR with evacuation of hematoma. Additionally his postoperative course has been complicated by recurrent fevers,bilateral lower extremity DVTs on ultrasound,migratory arthralgias/arthritis that are thought to be rheumatologic in nature as well as significant orthostasis limiting the amount he can work with physical therapy. His arthralgia has since then resolved.   Noted to have decreased left hand grip on 10/19, MRI showed large epidural hematoma, patient s/p clot evacuation.  He was place back on anticoagulation argatroban on 10-22. He was notice to be more bradycardic with HR in the 40 on 10-23. Cardiology was consulted, and argatroban was transition to bivalirrubin to avoid bradycardia. His HR improved after argatroban was discontinued. -HIT panel0.486 (mildly  elevated)- SRA (serotonin release assay) negative. Dr Marin Olp has been helping with patient care. He is recommending to avoid heparin or Lovenox products.   Patient developed weakness of left arm on 10-25. He was take emergently to OR for recurrence of epidural hematoma on 10-25. Anticoagulation was discontinue since 10-25. Dr Saintclair Halsted is recommending for patient to remain off anticoagulation for alt leas 1 week, and if anticoagulation is consider, that will need to be discuss with Dr Saintclair Halsted.  Patient the night of 10-28 develops left arm weakness, numbness. Stat MRI was ordered, which confirm recurrent hematoma. Patient was taken to OR for hematoma evacuation 10-28. He has been off anticoagulation since 10-25. For his thrombocytopenia, a Bone marrow biopsy was done. For LE DVT, he has IVC filter. He cant not be on anticoagulation due to recurrent bleeding.  Patient has received multiple platelet transfusions.  Platelet counts remain stable.   Pt seen and examined on 11/6: Pt appears frustrated that his strength hasn't im,proved much and his legs and hands are swollen. He wants [place] to decrease the steroids.    Assessment & Plan:   Principal Problem:   Fever Active Problems:   Myelopathy (HCC)   Benign essential HTN   Cord compression (HCC)   History of pulmonary embolism   Spondylosis, cervical, with myelopathy   Labile blood pressure   Orthostatic hypotension   Leukocytosis   Tachycardia-bradycardia syndrome (HCC)   Adjustment disorder with mixed anxiety and depressed mood  Cervical spine stenosis with lower extremity weakness more on the left compared to the right  S/p cervical decompressive surgery on on 06/14/2018.  S/p evacuation of large hematoma and placement of hemovac drain on 06/22/2018.  S/p clot evaluation on 10/19, 10/25, 10/28.  Neurosurgery on board and changing the dose of decadron .    Anemia of chronic disease:  Transfused 1  unit of prbc, . Keep hemoglobin around 7. No  obvious signs of bleeding.    Thrombocytopenia/ coagulopathy:  Unclear etiology.  Cardiolipin antibody is negative.  Lupus anti coagulant , homocysteine level and factor VIII WNL.  Protein c is high.  HIT panel mildly elevated. SRA negative.  He underwent a total of 5 unit s of platelet transfusion since admission.  BOne marrow biopsy done on 10/30 with non specific findings, suggesting cause possibly from medications.  Platelets continue to drop to <50,000 but greater than 40,000.  His fibrinogen level on the lower side and he is getting infusion of cryoprecipitate.    Near syncope due to orthostatic hypotension:  Appears to have resolved.  From autonomic dysfunction.  Echo showed normal systolic function and grade 2 diastolic dysfunction.  Recommended midodrine and abdominal binder.    Polyarthralgia: improvement with steroids.    Recent PE/ DVT in the setting of spine surgery:  Sp IVC filter placement on July 16 th 2019. Anti coagulation was discontinued due to bleeding episodes.   Hypertension:  Stable.    Hyperglycemia from steroids.  A1c is 5. 8%.  Increased the dose of lantus and added on novolog TIDAC.  CBG (last 3)  Recent Labs    07/19/18 2139 07/20/18 0730 07/20/18 1145  GLUCAP 350* 267* 206*      Adjustment disorder with anxiety and depression:  No signs of suicidal ideation.  Psychiatry was consulted and recommendations.    Bilateral 3+ leg and upper extremity edema: Increased lasix to 60 mg BID and monitor renal parameters while on lasix.  He was given a dose of albumin ON 11/5.      DVT prophylaxis: compression stockings  Code Status: full code.  Family Communication: none at bedside.  Disposition Plan: pending clinical improvement.    Consultants:   Neuro surgery   HEMATOLOGY.    Procedures:    Antimicrobials: none.    Subjective: PT IN GOOD spirits. No chest pain or sob. No nausea or vomiting or abdominal pain.      Objective: Vitals:   07/20/18 1145 07/20/18 1214 07/20/18 1315 07/20/18 1420  BP: 118/84 119/74 94/61 105/72  Pulse: 85 100 82 78  Resp: _0 Temp: 98.4 F (36.9 C) (!) 97.3 F (36.3 C) 98.1 F (36.7 C) 98 F (36.7 C)  TempSrc: Oral Axillary Oral Oral  SpO2: 99% 98% 97% 97%  Weight:      Height:        Intake/Output Summary (Last 24 hours) at 07/20/2018 1452 Last data filed at 07/20/2018 1315 Gross per 24 hour  Intake 425 ml  Output 4300 ml  Net -3875 ml   Filed Weights   06/22/18 1641 07/07/18 1000 07/18/18 0600  Weight: 123.8 kg 123.8 kg 135 kg    Examination:  General exam: Appears calm and comfortable , not in any kind of distress.  Respiratory system: diminished at bases, no wheezing or rhonchi.  Cardiovascular system: S1 & S2 heard, RRR.  No pedal edema. Gastrointestinal system: Abdomen is nondistended, soft and nontender. Good bowel sounds.  Central nervous system: Alert and oriented. Improving strength in the left upper extremity.  Extremities: 3+ leg edema, 2+ edema of the left arm.  Skin: No rashes, lesions or ulcers Psychiatry: Mood & affect appropriate.     Data Reviewed: I have personally reviewed following labs and imaging studies  CBC: Recent Labs  Lab 07/16/18 0559 07/17/18 0446 07/18/18 0500 07/19/18 0535 07/20/18 0535  WBC  6.2 6.1 7.6 7.8 6.6  NEUTROABS 5.5 5.4 6.7 7.0 5.5  HGB 10.2* 10.1* 10.5* 9.9* 11.4*  HCT 32.1* 31.8* 33.6* 30.7* 34.9*  MCV 95.5 96.4 94.9 92.5 93.6  PLT 66* 53* 53* 44* 41*   Basic Metabolic Panel: Recent Labs  Lab 07/15/18 0514 07/15/18 1059 07/16/18 0547 07/17/18 0446 07/18/18 0500  NA QUESTIONABLE RESULTS, RECOMMEND RECOLLECT TO VERIFY 140 137 139 137  K QUESTIONABLE RESULTS, RECOMMEND RECOLLECT TO VERIFY 4.0 4.2 4.2 4.3  CL QUESTIONABLE RESULTS, RECOMMEND RECOLLECT TO VERIFY 107 105 107 104  CO2 QUESTIONABLE RESULTS, RECOMMEND RECOLLECT TO VERIFY 27 25 29 28  GLUCOSE QUESTIONABLE RESULTS,  RECOMMEND RECOLLECT TO VERIFY 279* 284* 197* 255*  BUN QUESTIONABLE RESULTS, RECOMMEND RECOLLECT TO VERIFY 32* 33* 30* 29*  CREATININE QUESTIONABLE RESULTS, RECOMMEND RECOLLECT TO VERIFY 0.76 0.70 0.66 0.63  CALCIUM QUESTIONABLE RESULTS, RECOMMEND RECOLLECT TO VERIFY 9.1 8.7* 8.8* 8.8*   GFR: Estimated Creatinine Clearance: 147.3 mL/min (by C-G formula based on SCr of 0.63 mg/dL). Liver Function Tests: Recent Labs  Lab 07/15/18 0514 07/15/18 1059 07/16/18 0547 07/17/18 0446 07/18/18 0500  AST QUESTIONABLE RESULTS, RECOMMEND RECOLLECT TO VERIFY 19 18 17 19  ALT QUESTIONABLE RESULTS, RECOMMEND RECOLLECT TO VERIFY 6 6 6 10  ALKPHOS QUESTIONABLE RESULTS, RECOMMEND RECOLLECT TO VERIFY 59 77 69 59  BILITOT QUESTIONABLE RESULTS, RECOMMEND RECOLLECT TO VERIFY 0.7 0.6 0.5 0.6  PROT QUESTIONABLE RESULTS, RECOMMEND RECOLLECT TO VERIFY 5.3* 4.9* 4.8* 4.8*  ALBUMIN QUESTIONABLE RESULTS, RECOMMEND RECOLLECT TO VERIFY 2.8* 2.6* 2.5* 2.6*   No results for input(s): LIPASE, AMYLASE in the last 168 hours. No results for input(s): AMMONIA in the last 168 hours. Coagulation Profile: Recent Labs  Lab 07/14/18 1312 07/19/18 0535  INR 1.36 1.23   Cardiac Enzymes: No results for input(s): CKTOTAL, CKMB, CKMBINDEX, TROPONINI in the last 168 hours. BNP (last 3 results) No results for input(s): PROBNP in the last 8760 hours. HbA1C: No results for input(s): HGBA1C in the last 72 hours. CBG: Recent Labs  Lab 07/19/18 1147 07/19/18 1711 07/19/18 2139 07/20/18 0730 07/20/18 1145  GLUCAP 307* 390* 350* 267* 206*   Lipid Profile: No results for input(s): CHOL, HDL, LDLCALC, TRIG, CHOLHDL, LDLDIRECT in the last 72 hours. Thyroid Function Tests: No results for input(s): TSH, T4TOTAL, FREET4, T3FREE, THYROIDAB in the last 72 hours. Anemia Panel: No results for input(s): VITAMINB12, FOLATE, FERRITIN, TIBC, IRON, RETICCTPCT in the last 72 hours. Sepsis Labs: No results for input(s): PROCALCITON,  LATICACIDVEN in the last 168 hours.  No results found for this or any previous visit (from the past 240 hour(s)).       Radiology Studies: Dg Chest 2 View  Result Date: 07/20/2018 CLINICAL DATA:  Cough. very weak ,,had cervical spine surg 3 weeks ago and developed weakness bilat arms where he can't move them and unable to control them, attempted lat view but unable to get better film EXAM: CHEST - 2 VIEW COMPARISON:  Chest CT 07/15/2018, chest x-ray on 06/19/2018 FINDINGS: Shallow lung inflation. Heart size is normal. The MEDIAL lung bases, there is subsegmental atelectasis or early infiltrate. No pulmonary edema. Patient has a LEFT-sided PICC line, tip to the superior vena cava. Oxygen mask partially obscures the lung apices. IMPRESSION: 1. Shallow inflation. 2. Subsegmental atelectasis or early infiltrates in the lung bases. Electronically Signed   By: Elizabeth  Brown M.D.   On: 07/20/2018 10:27        Scheduled Meds: . clonazePAM  0.5 mg Oral BID  . dexamethasone    6 mg Intravenous Q12H  . docusate sodium  100 mg Oral Daily  . furosemide  60 mg Intravenous BID  . gabapentin  300 mg Oral TID  . Gerhardt's butt cream   Topical BID  . Influenza vac split quadrivalent PF  0.5 mL Intramuscular Tomorrow-1000  . insulin aspart  0-15 Units Subcutaneous TID WC  . insulin aspart  0-5 Units Subcutaneous QHS  . insulin aspart  3 Units Subcutaneous TID WC  . insulin glargine  35 Units Subcutaneous QHS  . magic mouthwash w/lidocaine  5 mL Oral TID  . methocarbamol  750 mg Oral QID  . midodrine  5 mg Oral BID WC  . nystatin  5 mL Oral TID AC & HS  . romiPLOStim  250 mcg Subcutaneous Q Thu  . senna-docusate  1 tablet Oral QHS  . sodium chloride flush  10-40 mL Intracatheter Q12H   Continuous Infusions: . sodium chloride    . famotidine (PEPCID) IV    . lactated ringers Stopped (07/20/18 0900)     LOS: 43 days    Time spent: 32 minutes.     Hosie Poisson, MD [facility] Pager [phone]  If 7PM-7AM, please contact night-coverage [email or web address] Password [email or web address] 07/20/2018, 2:52 PM

## 2018-07-20 NOTE — Progress Notes (Signed)
Physical Therapy Treatment Patient Details Name: [name] [name] MRN: [id number] DOB: 12/06/1965 Today's Date: 07/20/2018    History of Present Illness 52 y.o. male admitted on 06/06/18 for bil LE wekness and muscle spasm after multiple falls (with resultant R knee meniscus tear placed in bledsoe brace) following a recent ACDF at C3 in July 2019 (complicated by PE (s/p IVC filter), CIR, and then SNF placement before finally returning home with his parents).  In the ED MRI of c-spine which showed post operative changes and cord edema and possibility of ischemia.  His R knee was aspirated on 06/09/18.  S/p posterior cervical decompression and fusion on 06/14/18. Entire acute course complicated by hypotension in standing (thought to be autonomic) and by finding of multiple LE DVTs (already has an IVC filter in place, but per MDs notes at risk for blocking the filter with these clots).  Pt with post op issues with incisional bleeding s/p 2 evacuations of hematomas and placement of hemovac (06/22/18 and 07/01/18) as well as multiple units of plasma.  Cardiology consulted 07/05/18 due to bradycardia thought to be from his newly started blood thinner.  Pt with another re-accumulation of epidural hematoma with increased L sided weakness and cord compression with emergent evacuation surgery 10/25 & 07/10/18.  Pt also needing more platelets post op.  Bone marrow bx 07/12/18. PMHx of HTN, gout, anemia, DDD (lumbar)    PT Comments    Pt with better mood and affect today.  He is able to feel when he has a BM and I educated him on calling immediately when he knows he needs to be cleaned up as it puts him at higher risk for skin issues.  Pt was able to tolerate OOB to chair and was positioned with lateral support and pillows for pressure relief.  We spoke about securing a loaner WC for him to try and discussed power vs manual.  PT will continue to follow acutely for safe mobility progression  Follow Up Recommendations  [facility]     Equipment Recommendations  Wheelchair (measurements PT);Wheelchair cushion (measurements PT);Hospital bed;Other (comment)(hoyer lift, 22x22 tilt in space power chair)    Recommendations for Other Services   NA     Precautions / Restrictions Precautions Precautions: Fall;Cervical Required Braces or Orthoses: Cervical Brace Cervical Brace: Hard collar;At all times    Mobility  Bed Mobility Overal bed mobility: Needs Assistance Bed Mobility: Rolling Rolling: +2 for physical assistance;Total assist         General bed mobility comments: Rolled bil for peri care, despite working on hooking arms on bed rails for pt to help stabilize and hold himself in side lying while rolling he remains total assist for most of the management of his legs, trunk and pelvis.    Transfers Overall transfer level: Needs assistance               General transfer comment: Maxi move utilized for OOB time.                       Cognition Arousal/Alertness: Awake/alert Behavior During Therapy: WFL for tasks assessed/performed Overall Cognitive Status: Within Functional Limits for tasks assessed                                 General Comments: Better mood and affect today.          General Comments General comments (skin integrity, edema, etc.):  Positioned in chair with bil lateral pillow support.  Educated pt on the importance of calling immediately when he realizes he has had a BM to help ensure that he doesn't get sores on his bottom.        Pertinent Vitals/Pain Pain Assessment: Faces Faces Pain Scale: Hurts little more Pain Location: bil LEs and UEs, posterior neck.  Pt reports all of the edema is uncomfortable.  Pain Descriptors / Indicators: Aching;Constant Pain Intervention(s): Limited activity within patient's tolerance;Monitored during session;Repositioned           PT Goals (current goals can now be found in the care plan section) Acute  Rehab PT Goals Patient Stated Goal: Pt wants his edema to get better.  Progress towards PT goals: Progressing toward goals    Frequency    Min 2X/week      PT Plan Current plan remains appropriate       AM-PAC PT "6 Clicks" Daily Activity  Outcome Measure  Difficulty turning over in bed (including adjusting bedclothes, sheets and blankets)?: Unable Difficulty moving from lying on back to sitting on the side of the bed? : Unable Difficulty sitting down on and standing up from a chair with arms (e.g., wheelchair, bedside commode, etc,.)?: Unable Help needed moving to and from a bed to chair (including a wheelchair)?: Total Help needed walking in hospital room?: Total Help needed climbing 3-5 steps with a railing? : Total 6 Click Score: 6    End of Session Equipment Utilized During Treatment: Cervical collar Activity Tolerance: Patient tolerated treatment well Patient left: in chair;with call bell/phone within reach   PT Visit Diagnosis: Other abnormalities of gait and mobility (R26.89);Other symptoms and signs involving the nervous system (R29.898);Muscle weakness (generalized) (M62.81)     Time: 1610-9604 PT Time Calculation (min) (ACUTE ONLY): 31 min  Charges:  $Therapeutic Activity: 23-37 mins                    Amita Atayde B. Donovyn Guidice, PT, DPT  Acute Rehabilitation [phone] pager #[phone] office   07/20/2018, 9:59 PM

## 2018-07-20 NOTE — Progress Notes (Signed)
Right at shift change orders in for patient to receive cryo and for patient to have a chest xray related to his cough. In having patient in the past he has had a dry cough medication has been ordered (see MAR) but 3rd shift reports patient is now "coughing up phlegm". Patient is bed bound and has decreased ability to move and no ability to ambulate on his own.   Patient off the unit @ 0845 for chest xray.   Nurse spoke with Rx as patient has an order for subq NPLATE which cannot be tubed.   Nurse also spoke with blood bank about cryo having to thaw for ~20-25 minutes and then must be given within 4 hour window.

## 2018-07-21 LAB — CBC WITH DIFFERENTIAL/PLATELET
Abs Immature Granulocytes: 0.02 10*3/uL (ref 0.00–0.07)
BASOS PCT: 0 %
Basophils Absolute: 0 10*3/uL (ref 0.0–0.1)
EOS ABS: 0 10*3/uL (ref 0.0–0.5)
EOS PCT: 0 %
HEMATOCRIT: 33.8 % — AB (ref 39.0–52.0)
Hemoglobin: 10.3 g/dL — ABNORMAL LOW (ref 13.0–17.0)
Immature Granulocytes: 0 %
LYMPHS ABS: 0.4 10*3/uL — AB (ref 0.7–4.0)
Lymphocytes Relative: 8 %
MCH: 29.4 pg (ref 26.0–34.0)
MCHC: 30.5 g/dL (ref 30.0–36.0)
MCV: 96.6 fL (ref 80.0–100.0)
MONO ABS: 0.2 10*3/uL (ref 0.1–1.0)
MONOS PCT: 4 %
Neutro Abs: 4.2 10*3/uL (ref 1.7–7.7)
Neutrophils Relative %: 88 %
Platelets: 35 10*3/uL — ABNORMAL LOW (ref 150–400)
RBC: 3.5 MIL/uL — ABNORMAL LOW (ref 4.22–5.81)
RDW: 17.2 % — AB (ref 11.5–15.5)
WBC: 4.8 10*3/uL (ref 4.0–10.5)
nRBC: 0 % (ref 0.0–0.2)

## 2018-07-21 LAB — MRSA PCR SCREENING: MRSA by PCR: NEGATIVE

## 2018-07-21 LAB — GLUCOSE, CAPILLARY
GLUCOSE-CAPILLARY: 309 mg/dL — AB (ref 70–99)
GLUCOSE-CAPILLARY: 293 mg/dL — AB (ref 70–99)

## 2018-07-21 LAB — LACTATE DEHYDROGENASE: LDH: 320 U/L — AB (ref 98–192)

## 2018-07-21 LAB — APTT: APTT: 22 s — AB (ref 24–36)

## 2018-07-21 LAB — BPAM CRYOPRECIPITATE
BLOOD PRODUCT EXPIRATION DATE: 201911071457
Blood Product Expiration Date: 201911071457
ISSUE DATE / TIME: 201911071149
ISSUE DATE / TIME: 201911071423
UNIT TYPE AND RH: 5100
Unit Type and Rh: 5100

## 2018-07-21 LAB — PROTIME-INR
INR: 1.13
Prothrombin Time: 14.4 seconds (ref 11.4–15.2)

## 2018-07-21 LAB — COMPREHENSIVE METABOLIC PANEL
ALT: 13 U/L (ref 0–44)
AST: 23 U/L (ref 15–41)
Albumin: 3.2 g/dL — ABNORMAL LOW (ref 3.5–5.0)
Alkaline Phosphatase: 71 U/L (ref 38–126)
Anion gap: 8 (ref 5–15)
BUN: 42 mg/dL — ABNORMAL HIGH (ref 6–20)
CHLORIDE: 101 mmol/L (ref 98–111)
CO2: 32 mmol/L (ref 22–32)
CREATININE: 0.79 mg/dL (ref 0.61–1.24)
Calcium: 8.8 mg/dL — ABNORMAL LOW (ref 8.9–10.3)
Glucose, Bld: 313 mg/dL — ABNORMAL HIGH (ref 70–99)
Potassium: 3.5 mmol/L (ref 3.5–5.1)
Sodium: 141 mmol/L (ref 135–145)
TOTAL PROTEIN: 5.4 g/dL — AB (ref 6.5–8.1)
Total Bilirubin: 0.7 mg/dL (ref 0.3–1.2)

## 2018-07-21 LAB — PREPARE CRYOPRECIPITATE
UNIT DIVISION: 0
Unit division: 0

## 2018-07-21 LAB — FIBRINOGEN: Fibrinogen: 280 mg/dL (ref 210–475)

## 2018-07-21 LAB — TYPE AND SCREEN
ABO/RH(D): O POS
Antibody Screen: NEGATIVE

## 2018-07-21 MED ORDER — SODIUM CHLORIDE 0.9% IV SOLUTION
Freq: Once | INTRAVENOUS | Status: AC
  Administered 2018-07-21: 12:00:00 via INTRAVENOUS

## 2018-07-21 MED ORDER — INSULIN GLARGINE 100 UNIT/ML ~~LOC~~ SOLN
38.0000 [IU] | Freq: Every day | SUBCUTANEOUS | Status: DC
  Administered 2018-07-21: 38 [IU] via SUBCUTANEOUS
  Filled 2018-07-21 (×2): qty 0.38

## 2018-07-21 MED ORDER — INSULIN ASPART 100 UNIT/ML ~~LOC~~ SOLN
5.0000 [IU] | Freq: Three times a day (TID) | SUBCUTANEOUS | Status: DC
  Administered 2018-07-21 – 2018-07-27 (×19): 5 [IU] via SUBCUTANEOUS

## 2018-07-21 MED ORDER — SODIUM CHLORIDE 0.9% IV SOLUTION: Freq: Once | INTRAVENOUS | Status: DC

## 2018-07-21 NOTE — Progress Notes (Signed)
Physical Therapy Treatment Patient Details Name: [name] [name] MRN: [id number] DOB: 12-31-1965 Today's Date: 07/21/2018    History of Present Illness 52 y.o. male admitted on 06/06/18 for bil LE wekness and muscle spasm after multiple falls (with resultant R knee meniscus tear placed in bledsoe brace) following a recent ACDF at C3 in July 2019 (complicated by PE (s/p IVC filter), CIR, and then SNF placement before finally returning home with his parents).  In the ED MRI of c-spine which showed post operative changes and cord edema and possibility of ischemia.  His R knee was aspirated on 06/09/18.  S/p posterior cervical decompression and fusion on 06/14/18. Entire acute course complicated by hypotension in standing (thought to be autonomic) and by finding of multiple LE DVTs (already has an IVC filter in place, but per MDs notes at risk for blocking the filter with these clots).  Pt with post op issues with incisional bleeding s/p 2 evacuations of hematomas and placement of hemovac (06/22/18 and 07/01/18) as well as multiple units of plasma.  Cardiology consulted 07/05/18 due to bradycardia thought to be from his newly started blood thinner.  Pt with another re-accumulation of epidural hematoma with increased L sided weakness and cord compression with emergent evacuation surgery 10/25 & 07/10/18.  Pt also needing more platelets post op.  Bone marrow bx 07/12/18. PMHx of HTN, gout, anemia, DDD (lumbar)    PT Comments    Pt is progressing with tolerance of EOB and OOB mobility. His spirits are up today and he is laughing with staff.  I did check with inpatient rehab to see if there was an appropriate WC for [place] to borrow while [name] is here to get him out of the room and/or outside, but they do not.  He will need a 22" width tilt in space, ideally motorized with motor recline.  PT will continue to follow acutely for safe mobility progression  Follow Up Recommendations  [facility]     Equipment  Recommendations  Hospital bed;Other (comment)(hoyer lift 22x20 Tilt in space WC with roho cushion)    Recommendations for Other Services   NA     Precautions / Restrictions Precautions Precautions: Fall;Cervical Precaution Comments: quadriparesis Required Braces or Orthoses: Cervical Brace Cervical Brace: Hard collar;At all times    Mobility  Bed Mobility Overal bed mobility: Needs Assistance Bed Mobility: Rolling;Sidelying to Sit;Sit to Sidelying Rolling: +2 for physical assistance;Total assist Sidelying to sit: +2 for physical assistance;Total assist     Sit to sidelying: +2 for physical assistance;Total assist General bed mobility comments: Two person total assist to roll bil and come to sitting EOB.  Pt just does not have enough strength to assist much.  Transfers Overall transfer level: Needs assistance               General transfer comment: Maxi move utilized for OOB time.           Balance Overall balance assessment: Needs assistance Sitting-balance support: Feet supported;Bilateral upper extremity supported;No upper extremity supported;Single extremity supported Sitting balance-Leahy Scale: Zero Sitting balance - Comments: max to total assist, worked in sitting on holding head in upright alignment. Postural control: (varies)                                  Cognition Arousal/Alertness: Awake/alert Behavior During Therapy: WFL for tasks assessed/performed Overall Cognitive Status: Within Functional Limits for tasks assessed  General Comments: Good affect today.         General Comments General comments (skin integrity, edema, etc.): Unfortunately, we do not have a loaner chair that is big enough for him here.       Pertinent Vitals/Pain Pain Assessment: Faces Faces Pain Scale: Hurts even more Pain Location: left side "burning" and neck  Pain Descriptors / Indicators: Burning Pain  Intervention(s): Limited activity within patient's tolerance;Monitored during session;Repositioned       Prior Function            PT Goals (current goals can now be found in the care plan section) Acute Rehab PT Goals Patient Stated Goal: wants answers about his medical issues Progress towards PT goals: Progressing toward goals    Frequency    Min 2X/week      PT Plan Current plan remains appropriate       AM-PAC PT "6 Clicks" Daily Activity  Outcome Measure  Difficulty turning over in bed (including adjusting bedclothes, sheets and blankets)?: Unable Difficulty moving from lying on back to sitting on the side of the bed? : Unable Difficulty sitting down on and standing up from a chair with arms (e.g., wheelchair, bedside commode, etc,.)?: Unable Help needed moving to and from a bed to chair (including a wheelchair)?: Total Help needed walking in hospital room?: Total Help needed climbing 3-5 steps with a railing? : Total 6 Click Score: 6    End of Session Equipment Utilized During Treatment: Cervical collar Activity Tolerance: Patient tolerated treatment well Patient left: in chair;with call bell/phone within reach Nurse Communication: Need for lift equipment;Mobility status PT Visit Diagnosis: Other abnormalities of gait and mobility (R26.89);Other symptoms and signs involving the nervous system (R29.898);Muscle weakness (generalized) (M62.81)     Time: 1610-9604 PT Time Calculation (min) (ACUTE ONLY): 51 min  Charges:  $Therapeutic Activity: 38-52 mins           Uilani Sanville B. Bilan Tedesco, PT, DPT  Acute Rehabilitation [phone] pager #[phone] office            07/21/2018, 4:47 PM

## 2018-07-21 NOTE — Progress Notes (Signed)
It seems like [name] [name] is getting a little bit better.  He definitely has diuresed quite well.  He now is down 6.3 L since 07/07/2018.  I think this is a good thing.  He definitely does not look as "puffy."  His labs not yet back today.  Chest x-ray yesterday showed likely atelectasis at the bases.  He does have the incentive spirometer.  He is using this.  I think this is critical.  He is not coughing as much.  I would keep him on the nebulizers.  His blood sugar still is quite high.  There is been no bleeding.  He did get his dose of Nplate yesterday.  We will see what his platelet count is.  The cytogenetics on his bone marrow came back negative.  As such, I sincerely doubt that he has myelodysplasia.  I think given his young age, it would be highly likely that he would have some chromosomal derangement leading to myelodysplasia.  As such, I think any bone marrow decreased functioning probably would be iatrogenic.  It seems like he may have a little bit more function in the left hand.  I know he is getting physical therapy.  He is eating better.  He has had no nausea or vomiting.  There is been no diarrhea.  He did get cryoprecipitate yesterday to try to help bring his fibrinogen level up.  We are checking this level today.  I still cannot come up with a unifying diagnosis for everything from a hematologic point of view.  His blood pressure seems to be doing much better.  This morning, the blood pressure is 102/62.  There really is no change in the physical exam.  There might be some more range of motion of his left hand.  There is decreased swelling in his arms and legs.  Again, we will have to see what his fibrinogen level is.  We will have to see what his platelet count is.  We need to try to keep his platelet count above 50,000.  I appreciate all the great care that he is getting from all staff up on 4 NP.  [name], MD  2 Timothy 4:7

## 2018-07-21 NOTE — Progress Notes (Signed)
NEUROSURGERY PROGRESS NOTE  Doing well. No complaints right now  Temp:  [97.3 F (36.3 C)-98.4 F (36.9 C)] 98.3 F (36.8 C) (11/08 0724) Pulse Rate:  [78-100] 99 (11/08 0724) Resp:  [13-20] 13 (11/08 0724) BP: (94-119)/(61-84) 102/79 (11/08 0724) SpO2:  [96 %-99 %] 99 % (11/08 0724)  Plan: Will plan to give to give 1 unit of platelets. Platelet count down to 35 this morning. Per Dr. Myna Hidalgo want to keep platelets aroudn 50.   Sherryl Manges, NP 07/21/2018 10:17 AM

## 2018-07-21 NOTE — Progress Notes (Signed)
PROGRESS NOTE    [name] [name]  ELT:[id number] DOB: 1966/05/27 DOA: 06/06/2018 PCP: Helane Rima, MD    Brief Narrative: 52 year old man with past medical history relevant for gout, hypertension anterior cervical corpectomy of C4 on 03/24/2018 with comp gated postoperative course including development of multiple PEs and DVTs started on anticoagulation and subsequently developed hematoma requiring reexploration on 03/28/2018 status post placement of IVC filter who was discharged to skilled nursing facility and had a complicated course including progressive lower extremity weakness, intermittent fevers of unclear etiology and hospitalizations for "pneumonia". Patient was  transferred from [facility], he underwent MRI of C-spine that showed postoperative changes and cord compression with cord edema and concern for ischemia. Patient is status post posterior cervical decompression laminectomy of C3, C4, C5, removal of spinous processes, foraminotomies on 06/14/2018. On 06/22/2018 patient began to develop worsening left sided paresthesias and weakness and a stat MRI showed evidence of postoperative hematoma. Patient was taken to the OR with evacuation of hematoma. Additionally his postoperative course has been complicated by recurrent fevers,bilateral lower extremity DVTs on ultrasound,migratory arthralgias/arthritis that are thought to be rheumatologic in nature as well as significant orthostasis limiting the amount he can work with physical therapy. His arthralgia has since then resolved.   Noted to have decreased left hand grip on 10/19, MRI showed large epidural hematoma, patient s/p clot evacuation.  He was place back on anticoagulation argatroban on 10-22. He was notice to be more bradycardic with HR in the 40 on 10-23. Cardiology was consulted, and argatroban was transition to bivalirrubin to avoid bradycardia. His HR improved after argatroban was discontinued. -HIT panel0.486 (mildly  elevated)- SRA (serotonin release assay) negative. Dr Marin Olp has been helping with patient care. He is recommending to avoid heparin or Lovenox products.   Patient developed weakness of left arm on 10-25. He was take emergently to OR for recurrence of epidural hematoma on 10-25. Anticoagulation was discontinue since 10-25. Dr Saintclair Halsted is recommending for patient to remain off anticoagulation for alt leas 1 week, and if anticoagulation is consider, that will need to be discuss with Dr Saintclair Halsted.  Patient the night of 10-28 develops left arm weakness, numbness. Stat MRI was ordered, which confirm recurrent hematoma. Patient was taken to OR for hematoma evacuation 10-28. He has been off anticoagulation since 10-25. For his thrombocytopenia, a Bone marrow biopsy was done. For LE DVT, he has IVC filter. He cant not be on anticoagulation due to recurrent bleeding.  Patient has received multiple platelet transfusions.  Platelet counts remain stable.   Pt seen and examined on 11/6: Patient appears to be in good spirits, swelling of the hands and feet has improved   Assessment & Plan:   Principal Problem:   Fever Active Problems:   Myelopathy (HCC)   Benign essential HTN   Cord compression (HCC)   History of pulmonary embolism   Spondylosis, cervical, with myelopathy   Labile blood pressure   Orthostatic hypotension   Leukocytosis   Tachycardia-bradycardia syndrome (HCC)   Adjustment disorder with mixed anxiety and depressed mood  Cervical spine stenosis with lower extremity weakness more on the left compared to the right  S/p cervical decompressive surgery on on 06/14/2018.  S/p evacuation of large hematoma and placement of hemovac drain on 06/22/2018.  S/p clot evaluation on 10/19, 10/25, 10/28.  Neurosurgery on board assisting with management.   Anemia of chronic disease:  Transfused 1 unit of prbc, .  Globin stable around 10.3 Keep hemoglobin around 7. No  obvious signs of bleeding.     Thrombocytopenia/ coagulopathy:  Unclear etiology.  Cardiolipin antibody is negative.  Lupus anti coagulant , homocysteine level and factor VIII WNL.  Protein c is high.  HIT panel mildly elevated. SRA negative.  He underwent a total of 5 unit s of platelet transfusion since admission.  For unit of platelets ordered for today, has platelet count  has dropped to 35,000. BOne marrow biopsy done on 10/30 with non specific findings, suggesting cause possibly from medications.   His fibrinogen level on the lower side and he is getting infusion of cryoprecipitate. Appreciate Dr. Ennever's recommendations.   Near syncope due to orthostatic hypotension:  Appears to have resolved.  From autonomic dysfunction.  Echo showed normal systolic function and grade 2 diastolic dysfunction.  Recommended midodrine and abdominal binder.    Polyarthralgia: improvement with steroids.    Recent PE/ DVT in the setting of spine surgery:  Sp IVC filter placement on July 16 th 2019. Anti coagulation was discontinued due to bleeding episodes.   Hypertension:  Stable.  No change in medications   Hyperglycemia from steroids.  A1c is 5. 8%.  Increased the dose of lantus to 38 units and increase the NovoLog to 5 units 3 times daily AC CBG (last 3)  Recent Labs    07/20/18 2148 07/21/18 0729 07/21/18 1123  GLUCAP 224* 309* 293*      Adjustment disorder with anxiety and depression:  No signs of suicidal ideation.  Psychiatry was consulted and recommendations.    Bilateral 3+ leg and upper extremity edema: Increased lasix to 60 mg BID and monitor renal parameters while on lasix.  He was given a dose of albumin ON 11/5.  Has diuresed about 7 L since admission and his swelling of the upper extremities and lower extremities are improving.     DVT prophylaxis: compression stockings  Code Status: full code.  Family Communication: none at bedside.  Disposition Plan: pending clinical  improvement.  As per the neurosurgery   Consultants:   Neuro surgery   HEMATOLOGY.    Procedures: 1 unit of platelet transfusion ordered   Antimicrobials: none.    Subjective: Patient denies any chest pain or shortness of breath or cough, no nausea, vomiting or abdominal pain.    Objective: Vitals:   07/21/18 1214 07/21/18 1218 07/21/18 1233 07/21/18 1348  BP: 112/79  102/72 107/80  Pulse: 87 86 88 92  Resp: (!) 22 18 11 19  Temp: 98 F (36.7 C)  97.9 F (36.6 C) 98.2 F (36.8 C)  TempSrc: Oral  Oral Oral  SpO2: 96%  98% 97%  Weight:      Height:        Intake/Output Summary (Last 24 hours) at 07/21/2018 1458 Last data filed at 07/21/2018 1349 Gross per 24 hour  Intake 825.5 ml  Output 3250 ml  Net -2424.5 ml   Filed Weights   06/22/18 1641 07/07/18 1000 07/18/18 0600  Weight: 123.8 kg 123.8 kg 135 kg    Examination:  General exam: Sitting in bed watching TV, not in any kind of distress Respiratory system: Air entry diminished at bases but no wheezing or rhonchi Cardiovascular system: S1 & S2 heard, RRR.  No pedal edema. Gastrointestinal system: Abdomen is soft, nontender, nondistended with good bowel sounds Central nervous system: Alert and oriented. Improving strength in the left upper extremity.  Extremities: 3+ leg edema, 2+ edema of the left arm.  Edema is improving Skin: No rashes, lesions or   ulcers Psychiatry: Mood & affect appropriate.     Data Reviewed: I have personally reviewed following labs and imaging studies  CBC: Recent Labs  Lab 07/17/18 0446 07/18/18 0500 07/19/18 0535 07/20/18 0535 07/21/18 0500  WBC 6.1 7.6 7.8 6.6 4.8  NEUTROABS 5.4 6.7 7.0 5.5 4.2  HGB 10.1* 10.5* 9.9* 11.4* 10.3*  HCT 31.8* 33.6* 30.7* 34.9* 33.8*  MCV 96.4 94.9 92.5 93.6 96.6  PLT 53* 53* 44* 41* 35*   Basic Metabolic Panel: Recent Labs  Lab 07/15/18 1059 07/16/18 0547 07/17/18 0446 07/18/18 0500 07/20/18 1449  NA 140 137 139 137 140  K 4.0  4.2 4.2 4.3 3.9  CL 107 105 107 104 98  CO2 _0 34*  GLUCOSE 279* 284* 197* 255* 254*  BUN 32* 33* 30* 29* 38*  CREATININE 0.76 0.70 0.66 0.63 0.83  CALCIUM 9.1 8.7* 8.8* 8.8* 9.5   GFR: Estimated Creatinine Clearance: 142 mL/min (by C-G formula based on SCr of 0.83 mg/dL). Liver Function Tests: Recent Labs  Lab 07/15/18 0514 07/15/18 1059 07/16/18 0547 07/17/18 0446 07/18/18 0500  AST QUESTIONABLE RESULTS, RECOMMEND RECOLLECT TO VERIFY _1 ALT QUESTIONABLE RESULTS, RECOMMEND RECOLLECT TO VERIFY _2 ALKPHOS QUESTIONABLE RESULTS, RECOMMEND RECOLLECT TO VERIFY 59 77 69 59  BILITOT QUESTIONABLE RESULTS, RECOMMEND RECOLLECT TO VERIFY 0.7 0.6 0.5 0.6  PROT QUESTIONABLE RESULTS, RECOMMEND RECOLLECT TO VERIFY 5.3* 4.9* 4.8* 4.8*  ALBUMIN QUESTIONABLE RESULTS, RECOMMEND RECOLLECT TO VERIFY 2.8* 2.6* 2.5* 2.6*   No results for input(s): LIPASE, AMYLASE in the last 168 hours. No results for input(s): AMMONIA in the last 168 hours. Coagulation Profile: Recent Labs  Lab 07/19/18 0535 07/21/18 0500  INR 1.23 1.13   Cardiac Enzymes: No results for input(s): CKTOTAL, CKMB, CKMBINDEX, TROPONINI in the last 168 hours. BNP (last 3 results) No results for input(s): PROBNP in the last 8760 hours. HbA1C: No results for input(s): HGBA1C in the last 72 hours. CBG: Recent Labs  Lab 07/20/18 1145 07/20/18 1653 07/20/18 2148 07/21/18 0729 07/21/18 1123  GLUCAP 206* 276* 224* 309* 293*   Lipid Profile: No results for input(s): CHOL, HDL, LDLCALC, TRIG, CHOLHDL, LDLDIRECT in the last 72 hours. Thyroid Function Tests: No results for input(s): TSH, T4TOTAL, FREET4, T3FREE, THYROIDAB in the last 72 hours. Anemia Panel: No results for input(s): VITAMINB12, FOLATE, FERRITIN, TIBC, IRON, RETICCTPCT in the last 72 hours. Sepsis Labs: No results for input(s): PROCALCITON, LATICACIDVEN in the last 168 hours.  No results found for this or any previous visit (from the past  240 hour(s)).       Radiology Studies: Dg Chest 2 View  Result Date: 07/20/2018 CLINICAL DATA:  Cough. very weak ,,had cervical spine surg 3 weeks ago and developed weakness bilat arms where he can't move them and unable to control them, attempted lat view but unable to get better film EXAM: CHEST - 2 VIEW COMPARISON:  Chest CT 07/15/2018, chest x-ray on 06/19/2018 FINDINGS: Shallow lung inflation. Heart size is normal. The MEDIAL lung bases, there is subsegmental atelectasis or early infiltrate. No pulmonary edema. Patient has a LEFT-sided PICC line, tip to the superior vena cava. Oxygen mask partially obscures the lung apices. IMPRESSION: 1. Shallow inflation. 2. Subsegmental atelectasis or early infiltrates in the lung bases. Electronically Signed   By: Nolon Nations M.D.   On: 07/20/2018 10:27        Scheduled Meds: . sodium chloride   Intravenous Once  . clonazePAM  0.5 mg Oral BID  . dexamethasone  6 mg Intravenous Q12H  . docusate sodium  100 mg Oral Daily  . furosemide  60 mg Intravenous BID  . gabapentin  300 mg Oral TID  . Gerhardt's butt cream   Topical BID  . Influenza vac split quadrivalent PF  0.5 mL Intramuscular Tomorrow-1000  . insulin aspart  0-15 Units Subcutaneous TID WC  . insulin aspart  0-5 Units Subcutaneous QHS  . insulin aspart  3 Units Subcutaneous TID WC  . insulin glargine  35 Units Subcutaneous QHS  . magic mouthwash w/lidocaine  5 mL Oral TID  . methocarbamol  750 mg Oral QID  . midodrine  5 mg Oral BID WC  . nystatin  5 mL Oral TID AC & HS  . romiPLOStim  250 mcg Subcutaneous Q Thu  . senna-docusate  1 tablet Oral QHS  . sodium chloride flush  10-40 mL Intracatheter Q12H   Continuous Infusions: . sodium chloride    . famotidine (PEPCID) IV    . lactated ringers Stopped (07/20/18 0900)     LOS: 44 days    Time spent: 28 minutes.     Vijaya Akula, MD [facility] Pager [phone]  If 7PM-7AM, please contact  night-coverage [email or web address] Password [email or web address] 07/21/2018, 2:58 PM   

## 2018-07-22 LAB — CBC WITH DIFFERENTIAL/PLATELET
ABS IMMATURE GRANULOCYTES: 0.03 10*3/uL (ref 0.00–0.07)
BASOS ABS: 0 10*3/uL (ref 0.0–0.1)
BASOS PCT: 0 %
EOS ABS: 0 10*3/uL (ref 0.0–0.5)
EOS PCT: 0 %
HCT: 33.4 % — ABNORMAL LOW (ref 39.0–52.0)
Hemoglobin: 10.2 g/dL — ABNORMAL LOW (ref 13.0–17.0)
IMMATURE GRANULOCYTES: 1 %
Lymphocytes Relative: 9 %
Lymphs Abs: 0.3 10*3/uL — ABNORMAL LOW (ref 0.7–4.0)
MCH: 29.8 pg (ref 26.0–34.0)
MCHC: 30.5 g/dL (ref 30.0–36.0)
MCV: 97.7 fL (ref 80.0–100.0)
MONO ABS: 0.2 10*3/uL (ref 0.1–1.0)
Monocytes Relative: 4 %
NEUTROS ABS: 3.4 10*3/uL (ref 1.7–7.7)
Neutrophils Relative %: 86 %
PLATELETS: 70 10*3/uL — AB (ref 150–400)
RBC: 3.42 MIL/uL — ABNORMAL LOW (ref 4.22–5.81)
RDW: 17.3 % — ABNORMAL HIGH (ref 11.5–15.5)
WBC: 3.9 10*3/uL — AB (ref 4.0–10.5)
nRBC: 0 % (ref 0.0–0.2)

## 2018-07-22 LAB — PREPARE PLATELET PHERESIS
Unit division: 0
Unit division: 0

## 2018-07-22 LAB — COMPREHENSIVE METABOLIC PANEL
ALBUMIN: 3.2 g/dL — AB (ref 3.5–5.0)
ALT: 13 U/L (ref 0–44)
AST: 22 U/L (ref 15–41)
Alkaline Phosphatase: 57 U/L (ref 38–126)
Anion gap: 6 (ref 5–15)
BUN: 46 mg/dL — AB (ref 6–20)
CHLORIDE: 100 mmol/L (ref 98–111)
CO2: 35 mmol/L — AB (ref 22–32)
Calcium: 9.2 mg/dL (ref 8.9–10.3)
Creatinine, Ser: 0.7 mg/dL (ref 0.61–1.24)
GFR calc Af Amer: >60 mL/min (ref >60)
GLUCOSE: 230 mg/dL — AB (ref 70–99)
Potassium: 3.5 mmol/L (ref 3.5–5.1)
SODIUM: 141 mmol/L (ref 135–145)
Total Bilirubin: 0.9 mg/dL (ref 0.3–1.2)
Total Protein: 5.6 g/dL — ABNORMAL LOW (ref 6.5–8.1)

## 2018-07-22 LAB — BPAM PLATELET PHERESIS
BLOOD PRODUCT EXPIRATION DATE: 201911082359
Blood Product Expiration Date: 201911082359
ISSUE DATE / TIME: 201911081201
ISSUE DATE / TIME: 201911081542
UNIT TYPE AND RH: 5100
Unit Type and Rh: 5100

## 2018-07-22 LAB — APTT: APTT: 23 s — AB (ref 24–36)

## 2018-07-22 LAB — LACTATE DEHYDROGENASE: LDH: 327 U/L — ABNORMAL HIGH (ref 98–192)

## 2018-07-22 LAB — GLUCOSE, CAPILLARY
GLUCOSE-CAPILLARY: 138 mg/dL — AB (ref 70–99)
GLUCOSE-CAPILLARY: 262 mg/dL — AB (ref 70–99)

## 2018-07-22 MED ORDER — INSULIN GLARGINE 100 UNIT/ML ~~LOC~~ SOLN
40.0000 [IU] | Freq: Every day | SUBCUTANEOUS | Status: DC
  Administered 2018-07-22 – 2018-07-23 (×2): 40 [IU] via SUBCUTANEOUS
  Filled 2018-07-22 (×3): qty 0.4

## 2018-07-22 MED ORDER — PREMIER PROTEIN SHAKE
11.0000 [oz_av] | Freq: Four times a day (QID) | ORAL | Status: DC
  Administered 2018-07-22 – 2018-07-28 (×21): 11 [oz_av] via ORAL
  Filled 2018-07-22 (×27): qty 325.31

## 2018-07-22 NOTE — Progress Notes (Signed)
PROGRESS NOTE    [name] [name]  MRN:[id number] DOB: 07/12/1966 DOA: 06/06/2018 PCP: Howell, [name], MD    Brief Narrative: 52-year-old man with past medical history relevant for gout, hypertension anterior cervical corpectomy of C4 on 03/24/2018 with comp gated postoperative course including development of multiple PEs and DVTs started on anticoagulation and subsequently developed hematoma requiring reexploration on 03/28/2018 status post placement of IVC filter who was discharged to skilled nursing facility and had a complicated course including progressive lower extremity weakness, intermittent fevers of unclear etiology and hospitalizations for "pneumonia". Patient was  transferred from [facility], he underwent MRI of C-spine that showed postoperative changes and cord compression with cord edema and concern for ischemia. Patient is status post posterior cervical decompression laminectomy of C3, C4, C5, removal of spinous processes, foraminotomies on 06/14/2018. On 06/22/2018 patient began to develop worsening left sided paresthesias and weakness and a stat MRI showed evidence of postoperative hematoma. Patient was taken to the OR with evacuation of hematoma. Additionally his postoperative course has been complicated by recurrent fevers,bilateral lower extremity DVTs on ultrasound,migratory arthralgias/arthritis that are thought to be rheumatologic in nature as well as significant orthostasis limiting the amount he can work with physical therapy. His arthralgia has since then resolved.   Noted to have decreased left hand grip on 10/19, MRI showed large epidural hematoma, patient s/p clot evacuation.  He was place back on anticoagulation argatroban on 10-22. He was notice to be more bradycardic with HR in the 40 on 10-23. Cardiology was consulted, and argatroban was transition to bivalirrubin to avoid bradycardia. His HR improved after argatroban was discontinued. -HIT panel0.486 (mildly  elevated)- SRA (serotonin release assay) negative. Dr Ennever has been helping with patient care. He is recommending to avoid heparin or Lovenox products.   Patient developed weakness of left arm on 10-25. He was take emergently to OR for recurrence of epidural hematoma on 10-25. Anticoagulation was discontinue since 10-25. Dr Cram is recommending for patient to remain off anticoagulation for alt leas 1 week, and if anticoagulation is consider, that will need to be discuss with Dr Cram.  Patient the night of 10-28 develops left arm weakness, numbness. Stat MRI was ordered, which confirm recurrent hematoma. Patient was taken to OR for hematoma evacuation 10-28. He has been off anticoagulation since 10-25. For his thrombocytopenia, a Bone marrow biopsy was done. For LE DVT, he has IVC filter. He cant not be on anticoagulation due to recurrent bleeding.  Patient has received multiple platelet transfusions.  Platelet counts remain stable.   Pt seen and examined on 11/9  His lower and upper extremity edema is improving. He is able to move his left hand fingers and able to grasp a little. Working with PT and sat in the   chair for 2 to 3 hours yesterday.    Assessment & Plan:   Principal Problem:   Fever Active Problems:   Myelopathy (HCC)   Benign essential HTN   Cord compression (HCC)   History of pulmonary embolism   Spondylosis, cervical, with myelopathy   Labile blood pressure   Orthostatic hypotension   Leukocytosis   Tachycardia-bradycardia syndrome (HCC)   Adjustment disorder with mixed anxiety and depressed mood  Cervical spine stenosis with lower extremity weakness more on the left compared to the right  S/p cervical decompressive surgery on on 06/14/2018.  S/p evacuation of large hematoma and placement of hemovac drain on 06/22/2018.  S/p clot evaluation on 10/19, 10/25, 10/28.  Neurosurgery on board   assisting with management. Working with physical therapy and patient reports his  left hand and fingers are improving   Anemia of chronic disease:  Transfused 1 unit of prbc, .  Hemoglobin stable at 10.2 Keep hemoglobin around 7. No obvious signs of bleeding.    Thrombocytopenia/ coagulopathy:  Unclear etiology.  Cardiolipin antibody is negative.  Lupus anti coagulant , homocysteine level and factor VIII WNL.  Protein c is high.  HIT panel mildly elevated. SRA negative.  He underwent a total of 6 units of platelets during the admission and his platelet count has improved to 70,000 no obvious signs of bleeding BOne marrow biopsy done on 10/30 with non specific findings, suggesting cause possibly from medications.   His fibrinogen level on the lower side and he is getting infusion of cryoprecipitate. Appreciate Dr. Antonieta Pert recommendations.   Near syncope due to orthostatic hypotension:  Appears to have resolved.  From autonomic dysfunction.  Echo showed normal systolic function and grade 2 diastolic dysfunction.  Recommended midodrine and abdominal binder.    Polyarthralgia: improvement with steroids.    Recent PE/ DVT in the setting of spine surgery:  Sp IVC filter placement on July 16 th 2019. Anti coagulation was discontinued due to bleeding episodes.   Hypertension:  Stable.  No change in medications   Hyperglycemia from steroids.  A1c is 5. 8%.  Increased the dose of lantus to 40 units and increase the NovoLog to 5 units 3 times daily AC CBG (last 3)  Recent Labs    07/21/18 0729 07/21/18 1123 07/22/18 1630  GLUCAP 309* 293* 138*      Adjustment disorder with anxiety and depression:  No signs of suicidal ideation.  Psychiatry was consulted and recommendations.    Bilateral 3+ leg and upper extremity edema: Increased lasix to 60 mg BID and monitor renal parameters while on lasix.  He was given a dose of albumin ON 11/5.  He has diuresed about 11 L since admission the swelling of the upper and lower extremities has improved.  Meant to  continue IV Lasix twice daily     DVT prophylaxis: compression stockings  Code Status: full code.  Family Communication: none at bedside.  Disposition Plan: pending clinical improvement.  As per the neurosurgery and physical therapy   Consultants:   Neuro surgery   HEMATOLOGY.    Procedures: None   Antimicrobials: none.    Subjective: Patient denies any chest pain or shortness of breath at this time.  Occasional dry cough and patient reports that he is unable to talk loudly denies any pain during talking or swallowing. Objective: Vitals:   07/22/18 0300 07/22/18 0746 07/22/18 1326 07/22/18 1623  BP: 115/84 114/84 (!) 115/92 97/77  Pulse: 87 84 86 90  Resp: _0 Temp: 98.5 F (36.9 C) 98.4 F (36.9 C) 97.9 F (36.6 C) 98 F (36.7 C)  TempSrc: Oral Oral Oral Oral  SpO2: 96% 97% 94% 95%  Weight:      Height:        Intake/Output Summary (Last 24 hours) at 07/22/2018 1721 Last data filed at 07/22/2018 1330 Gross per 24 hour  Intake -  Output 2210 ml  Net -2210 ml   Filed Weights   06/22/18 1641 07/07/18 1000 07/18/18 0600  Weight: 123.8 kg 123.8 kg 135 kg    Examination:  General exam: Calm and comfortable Respiratory system: Air entry diminished at bases no wheezing or rhonchi Cardiovascular system: S1 & S2 heard, RRR.  No JVD. Gastrointestinal system: Abdomen is soft, tender, nondistended with good bowel sounds Central nervous system: Alert and oriented. Improving strength in the left upper extremity.  Extremities: 3+ leg edema, 2+ edema of the left arm.  Edema is improving Skin: No rashes, lesions or ulcers Psychiatry: Mood & affect appropriate.     Data Reviewed: I have personally reviewed following labs and imaging studies  CBC: Recent Labs  Lab 07/18/18 0500 07/19/18 0535 07/20/18 0535 07/21/18 0500 07/22/18 0500  WBC 7.6 7.8 6.6 4.8 3.9*  NEUTROABS 6.7 7.0 5.5 4.2 3.4  HGB 10.5* 9.9* 11.4* 10.3* 10.2*  HCT 33.6* 30.7* 34.9*  33.8* 33.4*  MCV 94.9 92.5 93.6 96.6 97.7  PLT 53* 44* 41* 35* 70*   Basic Metabolic Panel: Recent Labs  Lab 07/17/18 0446 07/18/18 0500 07/20/18 1449 07/21/18 1840 07/22/18 0500  NA 139 137 140 141 141  K 4.2 4.3 3.9 3.5 3.5  CL 107 104 98 101 100  CO2 29 28 34* 32 35*  GLUCOSE 197* 255* 254* 313* 230*  BUN 30* 29* 38* 42* 46*  CREATININE 0.66 0.63 0.83 0.79 0.70  CALCIUM 8.8* 8.8* 9.5 8.8* 9.2   GFR: Estimated Creatinine Clearance: 147.3 mL/min (by C-G formula based on SCr of 0.7 mg/dL). Liver Function Tests: Recent Labs  Lab 07/16/18 0547 07/17/18 0446 07/18/18 0500 07/21/18 1840 07/22/18 0500  AST _0 ALT _1 ALKPHOS 77 69 59 71 57  BILITOT 0.6 0.5 0.6 0.7 0.9  PROT 4.9* 4.8* 4.8* 5.4* 5.6*  ALBUMIN 2.6* 2.5* 2.6* 3.2* 3.2*   No results for input(s): LIPASE, AMYLASE in the last 168 hours. No results for input(s): AMMONIA in the last 168 hours. Coagulation Profile: Recent Labs  Lab 07/19/18 0535 07/21/18 0500  INR 1.23 1.13   Cardiac Enzymes: No results for input(s): CKTOTAL, CKMB, CKMBINDEX, TROPONINI in the last 168 hours. BNP (last 3 results) No results for input(s): PROBNP in the last 8760 hours. HbA1C: No results for input(s): HGBA1C in the last 72 hours. CBG: Recent Labs  Lab 07/20/18 1653 07/20/18 2148 07/21/18 0729 07/21/18 1123 07/22/18 1630  GLUCAP 276* 224* 309* 293* 138*   Lipid Profile: No results for input(s): CHOL, HDL, LDLCALC, TRIG, CHOLHDL, LDLDIRECT in the last 72 hours. Thyroid Function Tests: No results for input(s): TSH, T4TOTAL, FREET4, T3FREE, THYROIDAB in the last 72 hours. Anemia Panel: No results for input(s): VITAMINB12, FOLATE, FERRITIN, TIBC, IRON, RETICCTPCT in the last 72 hours. Sepsis Labs: No results for input(s): PROCALCITON, LATICACIDVEN in the last 168 hours.  Recent Results (from the past 240 hour(s))  MRSA PCR Screening     Status: None   Collection Time: 07/21/18  3:41 PM    Result Value Ref Range Status   MRSA by PCR NEGATIVE NEGATIVE Final    Comment:        The GeneXpert MRSA Assay (FDA approved for NASAL specimens only), is one component of a comprehensive MRSA colonization surveillance program. It is not intended to diagnose MRSA infection nor to guide or monitor treatment for MRSA infections. Performed at [facility], [place] [address] [place], [place] [address]          Radiology Studies: No results found.      Scheduled Meds: . sodium chloride   Intravenous Once  . clonazePAM  0.5 mg Oral BID  . dexamethasone  6 mg Intravenous Q12H  . docusate sodium  100 mg Oral Daily  .  furosemide  60 mg Intravenous BID  . gabapentin  300 mg Oral TID  . Gerhardt's butt cream   Topical BID  . Influenza vac split quadrivalent PF  0.5 mL Intramuscular Tomorrow-1000  . insulin aspart  0-15 Units Subcutaneous TID WC  . insulin aspart  0-5 Units Subcutaneous QHS  . insulin aspart  5 Units Subcutaneous TID WC  . insulin glargine  38 Units Subcutaneous QHS  . magic mouthwash w/lidocaine  5 mL Oral TID  . methocarbamol  750 mg Oral QID  . midodrine  5 mg Oral BID WC  . nystatin  5 mL Oral TID AC & HS  . protein supplement shake  11 oz Oral QID  . romiPLOStim  250 mcg Subcutaneous Q Thu  . senna-docusate  1 tablet Oral QHS  . sodium chloride flush  10-40 mL Intracatheter Q12H   Continuous Infusions: . sodium chloride    . famotidine (PEPCID) IV 40 mg (07/21/18 2315)  . lactated ringers Stopped (07/20/18 0900)     LOS: 45 days    Time spent: 28 minutes.     Vijaya Akula, MD [facility] Pager [phone]  If 7PM-7AM, please contact night-coverage [email or web address] Password [email or web address] 07/22/2018, 5:21 PM   

## 2018-07-22 NOTE — Progress Notes (Signed)
NEUROSURGERY PROGRESS NOTE  Doing ok. No improvement in strength. CBC pending this morning to check platelets. Patient does seem to be more frustrated this morning and is asking for a second opinion at [facility].   Temp:  [97.9 F (36.6 C)-98.6 F (37 C)] 98.4 F (36.9 C) (11/09 0746) Pulse Rate:  [84-109] 84 (11/09 0746) Resp:  [11-24] 17 (11/09 0746) BP: (102-124)/(68-89) 114/84 (11/09 0746) SpO2:  [95 %-99 %] 97 % (11/09 0746)  Sherryl Manges, NP 07/22/2018 8:05 AM

## 2018-07-23 ENCOUNTER — Inpatient Hospital Stay (HOSPITAL_COMMUNITY)

## 2018-07-23 LAB — GLUCOSE, CAPILLARY
GLUCOSE-CAPILLARY: 321 mg/dL — AB (ref 70–99)
GLUCOSE-CAPILLARY: 350 mg/dL — AB (ref 70–99)
Glucose-Capillary: 370 mg/dL — ABNORMAL HIGH (ref 70–99)
Glucose-Capillary: 210 mg/dL — ABNORMAL HIGH (ref 70–99)
Glucose-Capillary: 174 mg/dL — ABNORMAL HIGH (ref 70–99)
Glucose-Capillary: 180 mg/dL — ABNORMAL HIGH (ref 70–99)
Glucose-Capillary: 353 mg/dL — ABNORMAL HIGH (ref 70–99)
Glucose-Capillary: 277 mg/dL — ABNORMAL HIGH (ref 70–99)

## 2018-07-23 LAB — CBC WITH DIFFERENTIAL/PLATELET
ABS IMMATURE GRANULOCYTES: 0.02 10*3/uL (ref 0.00–0.07)
Basophils Absolute: 0 10*3/uL (ref 0.0–0.1)
Basophils Relative: 0 %
Eosinophils Absolute: 0 10*3/uL (ref 0.0–0.5)
Eosinophils Relative: 0 %
HEMATOCRIT: 32.7 % — AB (ref 39.0–52.0)
Hemoglobin: 10.2 g/dL — ABNORMAL LOW (ref 13.0–17.0)
Immature Granulocytes: 1 %
LYMPHS ABS: 0.5 10*3/uL — AB (ref 0.7–4.0)
LYMPHS PCT: 18 %
MCH: 30.3 pg (ref 26.0–34.0)
MCHC: 31.2 g/dL (ref 30.0–36.0)
MCV: 97 fL (ref 80.0–100.0)
MONO ABS: 0.2 10*3/uL (ref 0.1–1.0)
MONOS PCT: 6 %
NEUTROS ABS: 2 10*3/uL (ref 1.7–7.7)
Neutrophils Relative %: 75 %
Platelets: 54 10*3/uL — ABNORMAL LOW (ref 150–400)
RBC: 3.37 MIL/uL — ABNORMAL LOW (ref 4.22–5.81)
RDW: 17.1 % — ABNORMAL HIGH (ref 11.5–15.5)
WBC: 2.7 10*3/uL — ABNORMAL LOW (ref 4.0–10.5)
nRBC: 0.7 % — ABNORMAL HIGH (ref 0.0–0.2)

## 2018-07-23 LAB — COMPREHENSIVE METABOLIC PANEL
ALT: 16 U/L (ref 0–44)
AST: 25 U/L (ref 15–41)
Albumin: 3.1 g/dL — ABNORMAL LOW (ref 3.5–5.0)
Alkaline Phosphatase: 55 U/L (ref 38–126)
Anion gap: 10 (ref 5–15)
BUN: 50 mg/dL — ABNORMAL HIGH (ref 6–20)
CO2: 33 mmol/L — ABNORMAL HIGH (ref 22–32)
CREATININE: 0.83 mg/dL (ref 0.61–1.24)
Calcium: 9 mg/dL (ref 8.9–10.3)
Chloride: 100 mmol/L (ref 98–111)
Glucose, Bld: 205 mg/dL — ABNORMAL HIGH (ref 70–99)
Potassium: 3.4 mmol/L — ABNORMAL LOW (ref 3.5–5.1)
Sodium: 143 mmol/L (ref 135–145)
Total Bilirubin: 0.9 mg/dL (ref 0.3–1.2)
Total Protein: 5.3 g/dL — ABNORMAL LOW (ref 6.5–8.1)

## 2018-07-23 LAB — LACTATE DEHYDROGENASE: LDH: 348 U/L — AB (ref 98–192)

## 2018-07-23 LAB — APTT: aPTT: 25 seconds (ref 24–36)

## 2018-07-23 MED ORDER — POTASSIUM CHLORIDE CRYS ER 20 MEQ PO TBCR
40.0000 meq | EXTENDED_RELEASE_TABLET | Freq: Once | ORAL | Status: AC
  Administered 2018-07-23: 40 meq via ORAL
  Filled 2018-07-23: qty 2

## 2018-07-23 MED ORDER — FUROSEMIDE 10 MG/ML IJ SOLN
40.0000 mg | Freq: Two times a day (BID) | INTRAMUSCULAR | Status: DC
  Administered 2018-07-23 – 2018-07-24 (×2): 40 mg via INTRAVENOUS
  Filled 2018-07-23 (×2): qty 4

## 2018-07-23 NOTE — Progress Notes (Signed)
Called MD for patient change in condition.  Patient had low BP  with 0400 vitals and was complaining of significant pain and pressure in lft leg.  Also lost mobility.  This is similar to past episodes when the hematoma formed post surgery.  Order for STAT MRI placed and radiology notified.

## 2018-07-23 NOTE — Progress Notes (Signed)
PROGRESS NOTE    [name] [name]  QMG:[id number] DOB: 1965/11/28 DOA: 06/06/2018 PCP: Helane Rima, MD    Brief Narrative: 52 year old man with past medical history relevant for gout, hypertension anterior cervical corpectomy of C4 on 03/24/2018 with comp gated postoperative course including development of multiple PEs and DVTs started on anticoagulation and subsequently developed hematoma requiring reexploration on 03/28/2018 status post placement of IVC filter who was discharged to skilled nursing facility and had a complicated course including progressive lower extremity weakness, intermittent fevers of unclear etiology and hospitalizations for "pneumonia". Patient was  transferred from [facility], he underwent MRI of C-spine that showed postoperative changes and cord compression with cord edema and concern for ischemia. Patient is status post posterior cervical decompression laminectomy of C3, C4, C5, removal of spinous processes, foraminotomies on 06/14/2018. On 06/22/2018 patient began to develop worsening left sided paresthesias and weakness and a stat MRI showed evidence of postoperative hematoma. Patient was taken to the OR with evacuation of hematoma. Additionally his postoperative course has been complicated by recurrent fevers,bilateral lower extremity DVTs on ultrasound,migratory arthralgias/arthritis that are thought to be rheumatologic in nature as well as significant orthostasis limiting the amount he can work with physical therapy. His arthralgia has since then resolved.   Noted to have decreased left hand grip on 10/19, MRI showed large epidural hematoma, patient s/p clot evacuation.  He was place back on anticoagulation argatroban on 10-22. He was notice to be more bradycardic with HR in the 40 on 10-23. Cardiology was consulted, and argatroban was transition to bivalirrubin to avoid bradycardia. His HR improved after argatroban was discontinued. -HIT panel0.486 (mildly  elevated)- SRA (serotonin release assay) negative. Dr Marin Olp has been helping with patient care. He is recommending to avoid heparin or Lovenox products.   Patient developed weakness of left arm on 10-25. He was take emergently to OR for recurrence of epidural hematoma on 10-25. Anticoagulation was discontinue since 10-25. Dr Saintclair Halsted is recommending for patient to remain off anticoagulation for alt leas 1 week, and if anticoagulation is consider, that will need to be discuss with Dr Saintclair Halsted.  Patient the night of 10-28 develops left arm weakness, numbness. Stat MRI was ordered, which confirm recurrent hematoma. Patient was taken to OR for hematoma evacuation 10-28. He has been off anticoagulation since 10-25. For his thrombocytopenia, a Bone marrow biopsy was done. For LE DVT, he has IVC filter. He cant not be on anticoagulation due to recurrent bleeding.  Patient has received multiple platelet transfusions.  Platelet counts remain stable.   Pt seen and examined on 11/9  Patient reports overnight his left leg was stiff and he was unable to move and felt crampy.  Probably from hypokalemia and diuretic use.  His left lower extremity strength remains the same when compared to yesterday and left upper extremity strength is improving he is able to grasp.  MRI of of the spine was ordered last night and discussed the results with the patient he appears to be in good spirits at this time.   Assessment & Plan:   Principal Problem:   Fever Active Problems:   Myelopathy (HCC)   Benign essential HTN   Cord compression (HCC)   History of pulmonary embolism   Spondylosis, cervical, with myelopathy   Labile blood pressure   Orthostatic hypotension   Leukocytosis   Tachycardia-bradycardia syndrome (HCC)   Adjustment disorder with mixed anxiety and depressed mood  Cervical spine stenosis with lower extremity weakness more on the left compared  to the right  S/p cervical decompressive surgery on on 06/14/2018.    S/p evacuation of large hematoma and placement of hemovac drain on 06/22/2018.  S/p clot evaluation on 10/19, 10/25, 10/28.  Neurosurgery on board assisting with management. Working with physical therapy and patient reports his left hand and fingers strength are improving   Anemia of chronic disease:  Transfused 1 unit of prbc, .  Hemoglobin stable at 10.2 Keep hemoglobin around 7. No obvious signs of bleeding.    Thrombocytopenia/ coagulopathy:  Unclear etiology.  Cardiolipin antibody is negative.  Lupus anti coagulant , homocysteine level and factor VIII WNL.  Protein c is high.  HIT panel mildly elevated. SRA negative.  He underwent a total of 6 units of platelets during the admission and his platelet count this morning is 54,000.  No obvious signs of bleeding BOne marrow biopsy done on 10/30 with non specific findings, suggesting cause possibly from medications.   His fibrinogen level on the lower side and he is getting infusion of cryoprecipitate. Appreciate Dr. Antonieta Pert recommendations.   Near syncope due to orthostatic hypotension:  Appears to have resolved.  From autonomic dysfunction.  Echo showed normal systolic function and grade 2 diastolic dysfunction.  Recommended midodrine and abdominal binder.    Polyarthralgia: improvement with steroids.    Recent PE/ DVT in the setting of spine surgery:  Sp IVC filter placement on July 16 th 2019. Anti coagulation was discontinued due to bleeding episodes.   Hypertension:  At night he became hypotensive possibly from diuretics decrease the dose of Lasix to 40 mg twice daily   Hyperglycemia from steroids.  A1c is 5. 8%.  Increased the dose of lantus to 40 units and increase the NovoLog to 5 units 3 times daily AC CBG (last 3)  Recent Labs    07/22/18 1630 07/22/18 2046 07/23/18 0733  GLUCAP 138* 262* 180*   No changes in insulin dose   Adjustment disorder with anxiety and depression:  No signs of suicidal  ideation.  Psychiatry was consulted and recommendations.    Bilateral 3+ leg and upper extremity edema: He was started on Lasix Lasix to 60 mg BID and monitor renal parameters while on lasix.  Diuresed about 12 L since admission and the swelling in the upper and lower extremities has improved.  Overnight he became hypotensive probably from over diuretic use will decrease the dose of Lasix to 40 mg twice daily.     DVT prophylaxis: compression stockings  Code Status: full code.  Family Communication: none at bedside.  Disposition Plan: pending clinical improvement.  As per the neurosurgery and physical therapy   Consultants:   Neuro surgery   HEMATOLOGY.    Procedures: None   Antimicrobials: none.    Subjective: Patient reports overnight his left leg was stiff and he was unable to move and felt crampy.  Probably from hypokalemia and diuretic use.  His left lower extremity strength remains the same when compared to yesterday and left upper extremity strength is improving he is able to grasp.  MRI of of the spine was ordered last night and discussed the results with the patient he appears to be in good spirits at this time.   Objective: Vitals:   07/23/18 0355 07/23/18 0410 07/23/18 0655 07/23/18 0805  BP: (!) 83/58  103/81   Pulse: 99 97 97   Resp: (!) 22 (!) 30 (!) 27   Temp:    98.7 F (37.1 C)  TempSrc:  SpO2: 95% 94% 94%   Weight:      Height:        Intake/Output Summary (Last 24 hours) at 07/23/2018 1120 Last data filed at 07/23/2018 0600 Gross per 24 hour  Intake 480 ml  Output 2250 ml  Net -1770 ml   Filed Weights   06/22/18 1641 07/07/18 1000 07/18/18 0600  Weight: 123.8 kg 123.8 kg 135 kg    Examination:  General exam: Calm and comfortable good spirits Respiratory system: Clear to auscultation bilaterally, no wheezing or rhonchi Cardiovascular system: S1 & S2 heard, RRR.  No JVD. Gastrointestinal system: Abdomen is soft, nontender  nondistended bowel sounds are good Central nervous system: Alert and oriented. Improving strength in the left upper extremity.  Extremities: 3+ leg edema, 2+ edema of the left arm.  Edema is improving Skin: No rashes, lesions or ulcers Psychiatry: Mood & affect appropriate.     Data Reviewed: I have personally reviewed following labs and imaging studies  CBC: Recent Labs  Lab 07/19/18 0535 07/20/18 0535 07/21/18 0500 07/22/18 0500 07/23/18 0455  WBC 7.8 6.6 4.8 3.9* 2.7*  NEUTROABS 7.0 5.5 4.2 3.4 2.0  HGB 9.9* 11.4* 10.3* 10.2* 10.2*  HCT 30.7* 34.9* 33.8* 33.4* 32.7*  MCV 92.5 93.6 96.6 97.7 97.0  PLT 44* 41* 35* 70* 54*   Basic Metabolic Panel: Recent Labs  Lab 07/18/18 0500 07/20/18 1449 07/21/18 1840 07/22/18 0500 07/23/18 0455  NA 137 140 141 141 143  K 4.3 3.9 3.5 3.5 3.4*  CL 104 98 101 100 100  CO2 28 34* 32 35* 33*  GLUCOSE 255* 254* 313* 230* 205*  BUN 29* 38* 42* 46* 50*  CREATININE 0.63 0.83 0.79 0.70 0.83  CALCIUM 8.8* 9.5 8.8* 9.2 9.0   GFR: Estimated Creatinine Clearance: 142 mL/min (by C-G formula based on SCr of 0.83 mg/dL). Liver Function Tests: Recent Labs  Lab 07/17/18 0446 07/18/18 0500 07/21/18 1840 07/22/18 0500 07/23/18 0455  AST _0 ALT _1 ALKPHOS 69 59 71 57 55  BILITOT 0.5 0.6 0.7 0.9 0.9  PROT 4.8* 4.8* 5.4* 5.6* 5.3*  ALBUMIN 2.5* 2.6* 3.2* 3.2* 3.1*   No results for input(s): LIPASE, AMYLASE in the last 168 hours. No results for input(s): AMMONIA in the last 168 hours. Coagulation Profile: Recent Labs  Lab 07/19/18 0535 07/21/18 0500  INR 1.23 1.13   Cardiac Enzymes: No results for input(s): CKTOTAL, CKMB, CKMBINDEX, TROPONINI in the last 168 hours. BNP (last 3 results) No results for input(s): PROBNP in the last 8760 hours. HbA1C: No results for input(s): HGBA1C in the last 72 hours. CBG: Recent Labs  Lab 07/22/18 0753 07/22/18 1406 07/22/18 1630 07/22/18 2046 07/23/18 0733    GLUCAP 210* 174* 138* 262* 180*   Lipid Profile: No results for input(s): CHOL, HDL, LDLCALC, TRIG, CHOLHDL, LDLDIRECT in the last 72 hours. Thyroid Function Tests: No results for input(s): TSH, T4TOTAL, FREET4, T3FREE, THYROIDAB in the last 72 hours. Anemia Panel: No results for input(s): VITAMINB12, FOLATE, FERRITIN, TIBC, IRON, RETICCTPCT in the last 72 hours. Sepsis Labs: No results for input(s): PROCALCITON, LATICACIDVEN in the last 168 hours.  Recent Results (from the past 240 hour(s))  MRSA PCR Screening     Status: None   Collection Time: 07/21/18  3:41 PM  Result Value Ref Range Status   MRSA by PCR NEGATIVE NEGATIVE Final    Comment:        The GeneXpert MRSA  Assay (FDA approved for NASAL specimens only), is one component of a comprehensive MRSA colonization surveillance program. It is not intended to diagnose MRSA infection nor to guide or monitor treatment for MRSA infections. Performed at [facility], [place] [address] [place], [place] [address]          Radiology Studies: Mr Cervical Spine Wo Contrast  Result Date: 07/23/2018 CLINICAL DATA:  Hematoma following recent cervical fusion procedure. EXAM: MRI CERVICAL SPINE WITHOUT CONTRAST TECHNIQUE: Multiplanar, multisequence MR imaging of the cervical spine was performed. No intravenous contrast was administered. COMPARISON:  07/10/2018 FINDINGS: Alignment: Normal Vertebrae: Distant C4 corpectomy with anterior fusion C3 through C5. Subsequent posterior decompression and fusion with lateral mass screws and posterior rods C2 through C5. No new bone finding. Cord: As described below, there is no longer any compressive effect upon the cord and spinal canal by the evacuated posterior fluid collection that was demonstrated on the previous studies. Subarachnoid spaces present dorsal to the cord. No evidence of active cord edema presently. Posterior Fossa, vertebral arteries, paraspinal tissues: Negative Disc levels:  Foramen magnum is widely patent. There is ordinary osteoarthritis of the C1-2 articulation but no encroachment upon the neural spaces. Posterior fusion begins at the C2 level. The C2-3 disc bulges mildly. There is mild narrowing of the canal at this level but no cord compression. Foramina appear sufficiently patent. From C3 through C5, there is been wide laminectomy with posterior fusion. C3 through C5 anterior fusion with C4 corpectomy. Subtotal vacuo a shin of the posterior fluid material is associated with resolution of the posterior compressive effect upon the spinal canal, thecal sac and spinal cord. 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 This continues to have some flattening affect on the left side of the cord, though ample subarachnoid spaces present dorsal to the cord. Foramina are not accurately evaluated due to the extensive surgical hardware artifact. C5-6: Mild bulging of the disc.  No canal or foraminal stenosis. C6-7: Normal interspace. C7-T1: Normal interspace. IMPRESSION: Marked improvement of the posterior fluid collection. Subtotal evacuation. A small amount of fluid remains in the midline but this does not have any compressive effect upon the thecal sac or spinal cord. Subarachnoid space is present dorsal to the spinal cord indicating absence of compression from this fluid. 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 There is subarachnoid space present dorsal to the cord in this region, however, because of the posterior decompression. Electronically Signed   By: Nelson Chimes M.D.   On: 07/23/2018 06:56         Scheduled Meds: . sodium chloride   Intravenous Once  . clonazePAM  0.5 mg Oral BID  . dexamethasone  6 mg Intravenous Q12H  . docusate sodium  100 mg Oral Daily  . furosemide  40 mg Intravenous BID  . gabapentin  300 mg Oral TID  . Gerhardt's butt cream   Topical BID  . Influenza vac split quadrivalent PF  0.5 mL Intramuscular Tomorrow-1000  . insulin aspart  0-15 Units Subcutaneous TID WC  . insulin aspart  0-5 Units Subcutaneous QHS  . insulin aspart  5 Units Subcutaneous TID  WC  . insulin glargine  40 Units Subcutaneous QHS  . magic mouthwash w/lidocaine  5 mL Oral TID  . methocarbamol  750 mg Oral QID  . midodrine  5 mg Oral BID WC  . nystatin  5 mL Oral TID AC & HS  . protein supplement shake  11 oz Oral QID  . romiPLOStim  250 mcg Subcutaneous Q Thu  . senna-docusate  1 tablet Oral QHS  . sodium chloride flush  10-40 mL Intracatheter Q12H   Continuous Infusions: . sodium chloride Stopped (07/22/18 2045)  . famotidine (PEPCID) IV 40 mg (07/21/18 2315)  . lactated ringers Stopped (07/20/18 0900)     LOS: 46 days    Time spent: 28 minutes.     Hosie Poisson, MD [facility] Pager [phone]  If 7PM-7AM, please contact night-coverage [email or web address] Password [email or web address] 07/23/2018, 11:20 AM

## 2018-07-23 NOTE — Progress Notes (Addendum)
NEUROSURGERY PROGRESS NOTE  Doing ok. Patient's strength in left lower extremity has not changed at all. He is still able to flicker his toes. Expressed this to MD last night but MRI was still ordered. Patient does not report any change in his strength but just feels like his left leg is tight. Platelets stable this morning at 54.   Temp:  [97.9 F (36.6 C)-99 F (37.2 C)] 98.7 F (37.1 C) (11/10 0805) Pulse Rate:  [86-102] 97 (11/10 0655) Resp:  [13-30] 27 (11/10 0655) BP: (78-115)/(47-92) 103/81 (11/10 0655) SpO2:  [93 %-100 %] 94 % (11/10 0655)   Sherryl Manges, NP 07/23/2018 9:26 AM

## 2018-07-24 LAB — CBC WITH DIFFERENTIAL/PLATELET
Abs Immature Granulocytes: 0.02 10*3/uL (ref 0.00–0.07)
Basophils Absolute: 0 10*3/uL (ref 0.0–0.1)
Basophils Relative: 0 %
EOS ABS: 0 10*3/uL (ref 0.0–0.5)
EOS PCT: 0 %
HEMATOCRIT: 32.6 % — AB (ref 39.0–52.0)
HEMOGLOBIN: 9.9 g/dL — AB (ref 13.0–17.0)
Immature Granulocytes: 1 %
LYMPHS ABS: 0.5 10*3/uL — AB (ref 0.7–4.0)
Lymphocytes Relative: 18 %
MCH: 29.6 pg (ref 26.0–34.0)
MCHC: 30.4 g/dL (ref 30.0–36.0)
MCV: 97.6 fL (ref 80.0–100.0)
MONOS PCT: 6 %
Monocytes Absolute: 0.2 10*3/uL (ref 0.1–1.0)
Neutro Abs: 2.1 10*3/uL (ref 1.7–7.7)
Neutrophils Relative %: 75 %
Platelets: 44 10*3/uL — ABNORMAL LOW (ref 150–400)
RBC: 3.34 MIL/uL — ABNORMAL LOW (ref 4.22–5.81)
RDW: 16.9 % — ABNORMAL HIGH (ref 11.5–15.5)
WBC: 2.8 10*3/uL — ABNORMAL LOW (ref 4.0–10.5)
nRBC: 0 % (ref 0.0–0.2)

## 2018-07-24 LAB — COMPREHENSIVE METABOLIC PANEL
ALK PHOS: 54 U/L (ref 38–126)
ALT: 16 U/L (ref 0–44)
ANION GAP: 6 (ref 5–15)
AST: 22 U/L (ref 15–41)
Albumin: 3 g/dL — ABNORMAL LOW (ref 3.5–5.0)
BILIRUBIN TOTAL: 0.8 mg/dL (ref 0.3–1.2)
BUN: 52 mg/dL — ABNORMAL HIGH (ref 6–20)
CALCIUM: 9.4 mg/dL (ref 8.9–10.3)
CO2: 35 mmol/L — ABNORMAL HIGH (ref 22–32)
CREATININE: 0.75 mg/dL (ref 0.61–1.24)
Chloride: 102 mmol/L (ref 98–111)
GFR calc non Af Amer: >60 mL/min (ref >60)
GLUCOSE: 256 mg/dL — AB (ref 70–99)
Potassium: 3.8 mmol/L (ref 3.5–5.1)
Sodium: 143 mmol/L (ref 135–145)
TOTAL PROTEIN: 5.7 g/dL — AB (ref 6.5–8.1)

## 2018-07-24 LAB — GLUCOSE, CAPILLARY
Glucose-Capillary: 256 mg/dL — ABNORMAL HIGH (ref 70–99)
Glucose-Capillary: 336 mg/dL — ABNORMAL HIGH (ref 70–99)
Glucose-Capillary: 261 mg/dL — ABNORMAL HIGH (ref 70–99)
Glucose-Capillary: 145 mg/dL — ABNORMAL HIGH (ref 70–99)

## 2018-07-24 LAB — LACTATE DEHYDROGENASE: LDH: 361 U/L — ABNORMAL HIGH (ref 98–192)

## 2018-07-24 LAB — APTT: aPTT: 24 seconds (ref 24–36)

## 2018-07-24 MED ORDER — INSULIN GLARGINE 100 UNIT/ML ~~LOC~~ SOLN
45.0000 [IU] | Freq: Every day | SUBCUTANEOUS | Status: DC
  Administered 2018-07-24 – 2018-07-27 (×4): 45 [IU] via SUBCUTANEOUS
  Filled 2018-07-24 (×5): qty 0.45

## 2018-07-24 MED ORDER — FUROSEMIDE 10 MG/ML IJ SOLN
40.0000 mg | Freq: Every day | INTRAMUSCULAR | Status: DC
  Administered 2018-07-25 – 2018-08-03 (×10): 40 mg via INTRAVENOUS
  Filled 2018-07-24 (×10): qty 4

## 2018-07-24 NOTE — Progress Notes (Signed)
PROGRESS NOTE    [name] [name]  IWP:[id number] DOB: Dec 22, 1965 DOA: 06/06/2018 PCP: Helane Rima, MD    Brief Narrative: 52 year old man with past medical history relevant for gout, hypertension anterior cervical corpectomy of C4 on 03/24/2018 with comp gated postoperative course including development of multiple PEs and DVTs started on anticoagulation and subsequently developed hematoma requiring reexploration on 03/28/2018 status post placement of IVC filter who was discharged to skilled nursing facility and had a complicated course including progressive lower extremity weakness, intermittent fevers of unclear etiology and hospitalizations for "pneumonia". Patient was  transferred from [facility], he underwent MRI of C-spine that showed postoperative changes and cord compression with cord edema and concern for ischemia. Patient is status post posterior cervical decompression laminectomy of C3, C4, C5, removal of spinous processes, foraminotomies on 06/14/2018. On 06/22/2018 patient began to develop worsening left sided paresthesias and weakness and a stat MRI showed evidence of postoperative hematoma. Patient was taken to the OR with evacuation of hematoma. Additionally his postoperative course has been complicated by recurrent fevers,bilateral lower extremity DVTs on ultrasound,migratory arthralgias/arthritis that are thought to be rheumatologic in nature as well as significant orthostasis limiting the amount he can work with physical therapy. His arthralgia has since then resolved.   Noted to have decreased left hand grip on 10/19, MRI showed large epidural hematoma, patient s/p clot evacuation.  He was place back on anticoagulation argatroban on 10-22. He was notice to be more bradycardic with HR in the 40 on 10-23. Cardiology was consulted, and argatroban was transition to bivalirrubin to avoid bradycardia. His HR improved after argatroban was discontinued. -HIT panel0.486 (mildly  elevated)- SRA (serotonin release assay) negative. Dr Marin Olp has been helping with patient care. He is recommending to avoid heparin or Lovenox products.   Patient developed weakness of left arm on 10-25. He was take emergently to OR for recurrence of epidural hematoma on 10-25. Anticoagulation was discontinue since 10-25. Dr Saintclair Halsted is recommending for patient to remain off anticoagulation for alt leas 1 week, and if anticoagulation is consider, that will need to be discuss with Dr Saintclair Halsted.  Patient the night of 10-28 develops left arm weakness, numbness. Stat MRI was ordered, which confirm recurrent hematoma. Patient was taken to OR for hematoma evacuation 10-28. He has been off anticoagulation since 10-25. For his thrombocytopenia, a Bone marrow biopsy was done. For LE DVT, he has IVC filter. He cant not be on anticoagulation due to recurrent bleeding.  Patient has received multiple platelet transfusions.  Platelet counts remain stable.    Assessment & Plan:   Principal Problem:   Fever Active Problems:   Myelopathy (HCC)   Benign essential HTN   Cord compression (HCC)   History of pulmonary embolism   Spondylosis, cervical, with myelopathy   Labile blood pressure   Orthostatic hypotension   Leukocytosis   Tachycardia-bradycardia syndrome (HCC)   Adjustment disorder with mixed anxiety and depressed mood  Cervical spine stenosis with lower extremity weakness more on the left compared to the right  S/p cervical decompressive surgery on on 06/14/2018.  S/p evacuation of large hematoma and placement of hemovac drain on 06/22/2018.  S/p clot evaluation on 10/19, 10/25, 10/28.  Neurosurgery on board assisting with management. Working with physical therapy, pt reports no improvement in the strength of the left upper extremity or the lower extremity since yesterday.  Repeat MRI of the spine shows near total evacuation of the hematoma, no cord compression.    Anemia of chronic disease:  Transfused 1 unit of prbc, .  Hemoglobin stable at 9.9.  Keep hemoglobin around 7. No obvious signs of bleeding.    Thrombocytopenia/ coagulopathy:  Unclear etiology.  Cardiolipin antibody is negative.  Lupus anti coagulant , homocysteine level and factor VIII WNL.  Protein c is high.  HIT panel mildly elevated. SRA negative.  He underwent a total of 6 units of platelets during the admission and his platelet count this morning is 44,000.  No obvious signs of bleeding BOne marrow biopsy done on 10/30 with non specific findings, suggesting cause possibly from medications.  Dr Marin Olp ordered another dose of NPLATE today.  Appreciate Dr. Antonieta Pert recommendations.   Near syncope due to orthostatic hypotension:  Appears to have resolved.  From autonomic dysfunction.  Echo showed normal systolic function and grade 2 diastolic dysfunction.  Recommended midodrine and abdominal binder.    Polyarthralgia: improvement with steroids.    Recent PE/ DVT in the setting of spine surgery:  Sp IVC filter placement on July 16 th 2019. Anti coagulation was discontinued due to bleeding episodes.   Hypertension:  Well controlled.    Hyperglycemia from steroids.  A1c is 5. 8%.  Increased the dose of lantus to 45 units and increase the NovoLog to 5 units 3 times daily AC CBG (last 3)  Recent Labs    07/24/18 0716 07/24/18 1216 07/24/18 1706  GLUCAP 256* 336* 261*   No changes in insulin dose   Adjustment disorder with anxiety and depression:  No signs of suicidal ideation.  Psychiatry was consulted and recommendations.    Bilateral 3+ leg and upper extremity edema: He was started on Lasix Lasix to 60 mg BID and monitor renal parameters while on lasix.  Diuresed about 14 L since admission and the swelling in the upper and lower extremities has improved.  Decreased the dose of lasix to 40 mg daily.    DVT prophylaxis: compression stockings  Code Status: full code.  Family  Communication: none at bedside.  Disposition Plan: further management as per neuro surgery and oncology.    Consultants:   Neuro surgery   HEMATOLOGY.    Procedures: None   Antimicrobials: none.    Subjective: No new complaints. No changes in the strength of the left upper extremity and left lower extremity.    Objective: Vitals:   07/24/18 0800 07/24/18 0900 07/24/18 1130 07/24/18 1600  BP: 108/72 133/89 112/76 104/76  Pulse: 89 98 86 83  Resp: (!) 25 20 (!) 24 (!) 23  Temp:  (!) 97.2 F (36.2 C) 98.1 F (36.7 C) 98.2 F (36.8 C)  TempSrc:  Oral Oral Oral  SpO2: 93% 98% 98% 97%  Weight:      Height:        Intake/Output Summary (Last 24 hours) at 07/24/2018 1852 Last data filed at 07/24/2018 1702 Gross per 24 hour  Intake 720 ml  Output 3301 ml  Net -2581 ml   Filed Weights   06/22/18 1641 07/07/18 1000 07/18/18 0600  Weight: 123.8 kg 123.8 kg 135 kg    Examination:  General exam: Calm and comfortable, in  good spirits Respiratory system:  Good air entry bilaterally. No wheezing or rhonchi.  Cardiovascular system: S1 & S2 heard, RRR.  No JVD. Gastrointestinal system: Abdomen is soft, non tender non distended bowel sounds good.  Central nervous system: Alert and oriented. Strength in the left upper extremity is 1/5, LE strength is 0/5 Extremities: 3+ leg edema, 2+ edema of the left arm.  Edema is improving Skin: No rashes, lesions or ulcers Psychiatry: Mood & affect appropriate.     Data Reviewed: I have personally reviewed following labs and imaging studies  CBC: Recent Labs  Lab 07/20/18 0535 07/21/18 0500 07/22/18 0500 07/23/18 0455 07/24/18 0440  WBC 6.6 4.8 3.9* 2.7* 2.8*  NEUTROABS 5.5 4.2 3.4 2.0 2.1  HGB 11.4* 10.3* 10.2* 10.2* 9.9*  HCT 34.9* 33.8* 33.4* 32.7* 32.6*  MCV 93.6 96.6 97.7 97.0 97.6  PLT 41* 35* 70* 54* 44*   Basic Metabolic Panel: Recent Labs  Lab 07/20/18 1449 07/21/18 1840 07/22/18 0500 07/23/18 0455  07/24/18 0440  NA 140 141 141 143 143  K 3.9 3.5 3.5 3.4* 3.8  CL 98 101 100 100 102  CO2 34* 32 35* 33* 35*  GLUCOSE 254* 313* 230* 205* 256*  BUN 38* 42* 46* 50* 52*  CREATININE 0.83 0.79 0.70 0.83 0.75  CALCIUM 9.5 8.8* 9.2 9.0 9.4   GFR: Estimated Creatinine Clearance: 147.3 mL/min (by C-G formula based on SCr of 0.75 mg/dL). Liver Function Tests: Recent Labs  Lab 07/18/18 0500 07/21/18 1840 07/22/18 0500 07/23/18 0455 07/24/18 0440  AST _0 ALT _1 ALKPHOS 59 71 57 55 54  BILITOT 0.6 0.7 0.9 0.9 0.8  PROT 4.8* 5.4* 5.6* 5.3* 5.7*  ALBUMIN 2.6* 3.2* 3.2* 3.1* 3.0*   No results for input(s): LIPASE, AMYLASE in the last 168 hours. No results for input(s): AMMONIA in the last 168 hours. Coagulation Profile: Recent Labs  Lab 07/19/18 0535 07/21/18 0500  INR 1.23 1.13   Cardiac Enzymes: No results for input(s): CKTOTAL, CKMB, CKMBINDEX, TROPONINI in the last 168 hours. BNP (last 3 results) No results for input(s): PROBNP in the last 8760 hours. HbA1C: No results for input(s): HGBA1C in the last 72 hours. CBG: Recent Labs  Lab 07/23/18 1722 07/23/18 2115 07/24/18 0716 07/24/18 1216 07/24/18 1706  GLUCAP 353* 277* 256* 336* 261*   Lipid Profile: No results for input(s): CHOL, HDL, LDLCALC, TRIG, CHOLHDL, LDLDIRECT in the last 72 hours. Thyroid Function Tests: No results for input(s): TSH, T4TOTAL, FREET4, T3FREE, THYROIDAB in the last 72 hours. Anemia Panel: No results for input(s): VITAMINB12, FOLATE, FERRITIN, TIBC, IRON, RETICCTPCT in the last 72 hours. Sepsis Labs: No results for input(s): PROCALCITON, LATICACIDVEN in the last 168 hours.  Recent Results (from the past 240 hour(s))  MRSA PCR Screening     Status: None   Collection Time: 07/21/18  3:41 PM  Result Value Ref Range Status   MRSA by PCR NEGATIVE NEGATIVE Final    Comment:        The GeneXpert MRSA Assay (FDA approved for NASAL specimens only), is one component  of a comprehensive MRSA colonization surveillance program. It is not intended to diagnose MRSA infection nor to guide or monitor treatment for MRSA infections. Performed at [facility], [address] [place], [place] [address]          Radiology Studies: Mr Cervical Spine Wo Contrast  Result Date: 07/23/2018 CLINICAL DATA:  Hematoma following recent cervical fusion procedure. EXAM: MRI CERVICAL SPINE WITHOUT CONTRAST TECHNIQUE: Multiplanar, multisequence MR imaging of the cervical spine was performed. No intravenous contrast was administered. COMPARISON:  07/10/2018 FINDINGS: Alignment: Normal Vertebrae: Distant C4 corpectomy with anterior fusion C3 through C5. Subsequent posterior decompression and fusion with lateral mass screws and posterior rods C2 through C5. No new bone finding. Cord: As described below, there is  no longer any compressive effect upon the cord and spinal canal by the evacuated posterior fluid collection that was demonstrated on the previous studies. Subarachnoid spaces present dorsal to the cord. No evidence of active cord edema presently. Posterior Fossa, vertebral arteries, paraspinal tissues: Negative Disc levels: Foramen magnum is widely patent. There is ordinary osteoarthritis of the C1-2 articulation but no encroachment upon the neural spaces. Posterior fusion begins at the C2 level. The C2-3 disc bulges mildly. There is mild narrowing of the canal at this level but no cord compression. Foramina appear sufficiently patent. From C3 through C5, there is been wide laminectomy with posterior fusion. C3 through C5 anterior fusion with C4 corpectomy. Subtotal vacuo a shin of the posterior fluid material is associated with resolution of the posterior compressive effect upon the spinal canal, thecal sac and spinal cord. 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 This continues to have some flattening affect on the left side of the cord, though ample subarachnoid spaces present dorsal to the cord. Foramina are not accurately evaluated due to the extensive surgical hardware artifact. C5-6: Mild bulging of the disc.  No canal or foraminal stenosis. C6-7: Normal interspace. C7-T1: Normal interspace. IMPRESSION: Marked improvement of the posterior fluid collection. Subtotal evacuation. A small amount of fluid remains in the midline but this does not have any compressive effect upon the thecal sac or spinal cord. Subarachnoid space is present dorsal to the spinal cord indicating absence of compression from this fluid. 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 There is subarachnoid space present dorsal to the cord in this region, however, because of the posterior decompression. Electronically Signed   By: Nelson Chimes M.D.   On: 07/23/2018 06:56        Scheduled Meds: . sodium chloride   Intravenous Once  . clonazePAM  0.5 mg Oral BID  . dexamethasone  6 mg Intravenous Q12H  . docusate sodium  100 mg Oral Daily  . [START ON 07/25/2018] furosemide  40 mg Intravenous Daily  . gabapentin  300 mg Oral TID  . Gerhardt's butt cream   Topical BID  . Influenza vac split quadrivalent PF  0.5 mL Intramuscular Tomorrow-1000  . insulin aspart  0-15 Units Subcutaneous TID WC  . insulin aspart  0-5 Units Subcutaneous QHS  . insulin aspart  5 Units Subcutaneous TID WC  . insulin glargine  45 Units Subcutaneous QHS  . magic mouthwash w/lidocaine  5 mL Oral TID  . methocarbamol  750 mg Oral QID  . midodrine  5 mg Oral BID WC  . nystatin  5 mL Oral TID AC & HS  . protein supplement shake  11 oz Oral  QID  . romiPLOStim  250 mcg Subcutaneous Q Thu  . senna-docusate  1 tablet Oral QHS  . sodium chloride flush  10-40 mL Intracatheter Q12H   Continuous Infusions: . sodium chloride Stopped (07/22/18 2045)  . famotidine (PEPCID) IV Stopped (07/21/18 2356)  . lactated ringers Stopped (07/20/18 0900)     LOS: 47 days    Time spent: 25 minutes.     Hosie Poisson, MD [facility] Pager [phone]  If 7PM-7AM, please contact night-coverage [email or web address] Password [facility] 07/24/2018, 6:52 PM

## 2018-07-24 NOTE — Progress Notes (Signed)
NEUROSURGERY PROGRESS NOTE  Doing well. No change in strength. Per patient, hem is going to order platelets. Plt count this morning was 44.   Temp:  [98.4 F (36.9 C)-98.6 F (37 C)] 98.5 F (36.9 C) (11/11 0400) Pulse Rate:  [78-106] 78 (11/11 0400) Resp:  [20-28] 20 (11/11 0400) BP: (118-126)/(74-89) 126/89 (11/11 0400) SpO2:  [95 %-97 %] 95 % (11/11 0400)  Plan: No acute events overnight.   Sherryl Manges, NP 07/24/2018 8:12 AM

## 2018-07-24 NOTE — Progress Notes (Signed)
[name] [name] is doing okay.  He really had no problems over the weekend.  He actually had an MRI of the neck done on Sunday.  The MRI of the neck did not show any obvious bleeding.  His CBC shows that his white cell count is 2.8 hemoglobin 9.9 platelet count out of 44,000.  Again, I am not sure why his bone marrow is just "weak."  I cannot find any medication that he is on that would cause this.  His cytogenetics all came back normal so I would not think that he has myelodysplasia.  I would have to believe that if he had a myelodysplasia at his age, that he would clearly have a high risk of cytogenetic abnormalities.  His blood sugars are still quite high.  There is still not a lot of movement over on the left side.  He does not have much fluid retention from what I can tell.  He really diuresed nicely last week.  His fluid out put has been 13.4 L greater than his fluid intake.  I we will try another dose of Nplate.  His LDH is elevated.  Again I am not sure as to why this would be the case.  I am still puzzled as to what a unifying hematologic diagnosis would be in [name] [name].  Thankfully, he is not bled.  I think we can hold on platelets for right now.  I will continue to titrate down his steroids.  I very much appreciate the fantastic care that he is getting from everybody up on 4 NP.   [name], MD  John 16:33

## 2018-07-24 NOTE — Progress Notes (Signed)
Inpatient Diabetes Program Recommendations  AACE/ADA: New Consensus Statement on Inpatient Glycemic Control (2015)  Target Ranges:  Prepandial:   less than 140 mg/dL      Peak postprandial:   less than 180 mg/dL (1-2 hours)      Critically ill patients:  140 - 180 mg/dL   Lab Results  Component Value Date   GLUCAP 256 (H) 07/24/2018   HGBA1C 5.8 (H) 07/16/2018    Review of Glycemic Control Results for [name] [name], [name] [name] (MRN [id number]) as of 07/24/2018 11:12  Ref. Range 07/23/2018 11:34 07/23/2018 17:22 07/23/2018 21:15 07/24/2018 07:16  Glucose-Capillary Latest Ref Range: 70 - 99 mg/dL 409 (H) 811 (H) 914 (H) 256 (H)   Diabetes history:None Outpatient Diabetes medications:None Current Orders: Lantus 40 units QHS                            Novolog Sensitive Correction Scale/ SSI (0-9 units) TID AC + HS      Novolog 5 units TID     Decadron increased to 10 mg Q6H on 11/04. CBGs quite elevated likely due to Decadron.    MD- Please consider the following in-hospital insulin adjustments while patient remains on IV Decadron:  1. Increase Lantus to 48 units QHS (~20% increase)  2. Start Novolog Meal Coverage: Novolog 8 units TID with meals  (Please add the following Hold Parameters: Hold if pt eats <50% of meal, Hold if pt NPO)   Thanks, Lujean Rave, MSN, RNC-OB Diabetes Coordinator [phone] (8a-5p)

## 2018-07-25 ENCOUNTER — Inpatient Hospital Stay (HOSPITAL_COMMUNITY)

## 2018-07-25 LAB — CBC WITH DIFFERENTIAL/PLATELET
Abs Immature Granulocytes: 0.08 10*3/uL — ABNORMAL HIGH (ref 0.00–0.07)
BASOS ABS: 0 10*3/uL (ref 0.0–0.1)
BASOS PCT: 0 %
Eosinophils Absolute: 0 10*3/uL (ref 0.0–0.5)
Eosinophils Relative: 0 %
HCT: 32.9 % — ABNORMAL LOW (ref 39.0–52.0)
Hemoglobin: 10.1 g/dL — ABNORMAL LOW (ref 13.0–17.0)
IMMATURE GRANULOCYTES: 2 %
Lymphocytes Relative: 15 %
Lymphs Abs: 0.5 10*3/uL — ABNORMAL LOW (ref 0.7–4.0)
MCH: 29.8 pg (ref 26.0–34.0)
MCHC: 30.7 g/dL (ref 30.0–36.0)
MCV: 97.1 fL (ref 80.0–100.0)
Monocytes Absolute: 0.2 10*3/uL (ref 0.1–1.0)
Monocytes Relative: 7 %
NEUTROS PCT: 76 %
NRBC: 0 % (ref 0.0–0.2)
Neutro Abs: 2.7 10*3/uL (ref 1.7–7.7)
PLATELETS: 36 10*3/uL — AB (ref 150–400)
RBC: 3.39 MIL/uL — AB (ref 4.22–5.81)
RDW: 16.8 % — AB (ref 11.5–15.5)
WBC: 3.5 10*3/uL — AB (ref 4.0–10.5)

## 2018-07-25 LAB — LACTATE DEHYDROGENASE: LDH: 392 U/L — AB (ref 98–192)

## 2018-07-25 LAB — GLUCOSE, CAPILLARY
GLUCOSE-CAPILLARY: 206 mg/dL — AB (ref 70–99)
Glucose-Capillary: 131 mg/dL — ABNORMAL HIGH (ref 70–99)
Glucose-Capillary: 134 mg/dL — ABNORMAL HIGH (ref 70–99)
Glucose-Capillary: 141 mg/dL — ABNORMAL HIGH (ref 70–99)

## 2018-07-25 LAB — APTT: APTT: 24 s (ref 24–36)

## 2018-07-25 MED ORDER — SODIUM CHLORIDE 0.9% IV SOLUTION
Freq: Once | INTRAVENOUS | Status: AC
  Administered 2018-07-25: 16:00:00 via INTRAVENOUS

## 2018-07-25 MED ORDER — FAMOTIDINE 20 MG PO TABS: 20.0000 mg | ORAL_TABLET | Freq: Two times a day (BID) | ORAL | Status: DC

## 2018-07-25 MED ORDER — FLUCONAZOLE IN SODIUM CHLORIDE 400-0.9 MG/200ML-% IV SOLN
400.0000 mg | INTRAVENOUS | Status: DC
  Administered 2018-07-25 – 2018-07-26 (×2): 400 mg via INTRAVENOUS
  Filled 2018-07-25 (×2): qty 200

## 2018-07-25 MED ORDER — DEXAMETHASONE SODIUM PHOSPHATE 10 MG/ML IJ SOLN
4.0000 mg | Freq: Two times a day (BID) | INTRAMUSCULAR | Status: DC
  Administered 2018-07-25 – 2018-07-27 (×4): 4 mg via INTRAVENOUS
  Filled 2018-07-25 (×4): qty 1

## 2018-07-25 NOTE — Progress Notes (Addendum)
Occupational Therapy Treatment Patient Details Name: [name] [name] MRN: [id number] DOB: 13-Oct-1965 Today's Date: 07/25/2018    History of present illness 52 y.o. male admitted on 06/06/18 for bil LE weakness and muscle spasm after multiple falls (with resultant R knee meniscus tear placed in bledsoe brace) following a recent ACDF at C3 in July 2019 (complicated by PE (s/p IVC filter), CIR, and then SNF placement before finally returning home with his parents).  In the ED MRI of c-spine which showed post operative changes and cord edema and possibility of ischemia.  His R knee was aspirated on 06/09/18.  S/p posterior cervical decompression and fusion on 06/14/18. Entire acute course complicated by hypotension in standing (thought to be autonomic) and by finding of multiple LE DVTs (already has an IVC filter in place, but per MDs notes at risk for blocking the filter with these clots).  Pt with post op issues with incisional bleeding s/p 2 evacuations of hematomas and placement of hemovac (06/22/18 and 07/01/18) as well as multiple units of plasma.  Cardiology consulted 07/05/18 due to bradycardia thought to be from his newly started blood thinner.  Pt with another re-accumulation of epidural hematoma with increased L sided weakness and cord compression with emergent evacuation surgery 10/25 & 07/10/18.  Pt also needing more platelets post op.  Bone marrow bx 07/12/18. PMHx of HTN, gout, anemia, DDD (lumbar)   OT comments  Upon arrival, pt supine, awake in bed. Pt presenting with increased motivation and positive affect this session. Pt participating in sitting balance exercises at EOB with Max-Total A for postural manipulation and balance. Pt requiring Total A +2 for bed mobility. Pt requiring Total A +2 for toilet hygiene after bowel incontinence at bed level. Utilizing Maxi Lift for transferring to recliner for OOB time. Continue to recommend dc to post-acute rehab and will continue to follow acutely as  admitted. Goals have been updated.   Follow Up Recommendations  SNF;Supervision/Assistance - 24 hour    Equipment Recommendations  Other (comment)    Recommendations for Other Services Rehab consult    Precautions / Restrictions Precautions Precautions: Fall;Cervical Precaution Comments: quadriparesis Required Braces or Orthoses: Cervical Brace Knee Immobilizer - Right: On when out of bed or walking Cervical Brace: Hard collar;At all times Other Brace/Splint: Use locked bledsoe RLE when and if he can start attempting to stand again.   Restrictions Weight Bearing Restrictions: No       Mobility Bed Mobility Overal bed mobility: Needs Assistance Bed Mobility: Rolling;Sidelying to Sit;Sit to Sidelying Rolling: +2 for physical assistance;Total assist Sidelying to sit: +2 for physical assistance;Total assist     Sit to sidelying: +2 for physical assistance;Total assist General bed mobility comments: Two person total assist to roll bil and come to sitting EOB.  Pt able to reach with RUE to assist in rolling left  Transfers Overall transfer level: Needs assistance   Transfers: Sit to/from Stand           General transfer comment: Maxi move utilized for OOB time.      Balance Overall balance assessment: Needs assistance Sitting-balance support: Feet supported;Bilateral upper extremity supported;No upper extremity supported;Single extremity supported Sitting balance-Leahy Scale: Zero Sitting balance - Comments: max to total assist, worked in sitting on holding head in upright alignment.                                   ADL either performed  or assessed with clinical judgement   ADL Overall ADL's : Needs assistance/impaired                         Toilet Transfer: Total assistance Toilet Transfer Details (indicate cue type and reason): Maxi lift to recliner Toileting- Clothing Manipulation and Hygiene: Total assistance;Bed level Toileting -  Clothing Manipulation Details (indicate cue type and reason): Total A for toilet hygiene after bowel incontience in bed. Total A for rolling in bed     Functional mobility during ADLs: Total assistance General ADL Comments: Pt participating in exericses at EOB and lifting to recliner for upright posture. Recommending to pt that he should sit for only 2 hours due to wound at saccral area     Vision       Perception     Praxis      Cognition Arousal/Alertness: Awake/alert Behavior During Therapy: WFL for tasks assessed/performed Overall Cognitive Status: Within Functional Limits for tasks assessed                                 General Comments: Good affect today and demonstrating increased motivation. Enjoyed Careers adviserJazz music during session        Exercises Exercises: Other exercises Other Exercises Other Exercises: sitting balance training at EOB with Max-Total A. Other Exercises: Facilitation of upright posture and requiring Max-Total to bring shoulder back. 5 reps. sitting at EOB   Shoulder Instructions       General Comments VSS.     Pertinent Vitals/ Pain       Pain Assessment: Faces Faces Pain Scale: Hurts little more Pain Location: left side "burning" and neck  Pain Descriptors / Indicators: Burning Pain Intervention(s): Monitored during session;Limited activity within patient's tolerance;Repositioned  Home Living                                          Prior Functioning/Environment              Frequency  Min 2X/week        Progress Toward Goals  OT Goals(current goals can now be found in the care plan section)  Progress towards OT goals: Progressing toward goals  Acute Rehab OT Goals Patient Stated Goal: wants answers about his medical issues OT Goal Formulation: With patient Time For Goal Achievement: 07/17/18 Potential to Achieve Goals: Good ADL Goals Pt Will Perform Eating: with modified independence;with  adaptive utensils;sitting Pt Will Perform Grooming: with min guard assist;sitting Pt Will Perform Upper Body Dressing: with min assist;sitting Pt Will Perform Lower Body Dressing: with mod assist;sitting/lateral leans;with adaptive equipment Pt Will Transfer to Toilet: with mod assist;bedside commode;stand pivot transfer;with transfer board Pt Will Perform Toileting - Clothing Manipulation and hygiene: with mod assist;sitting/lateral leans;sit to/from stand Pt/caregiver will Perform Home Exercise Program: Increased ROM;Increased strength;Both right and left upper extremity;With theraputty;With written HEP provided Additional ADL Goal #1: Pt will perform bed mobility with modA as precursor to EOB/OOB activity. Additional ADL Goal #2: Pt will maintain static sitting EOB with minguard assist during ADL task.  Plan Discharge plan remains appropriate    Co-evaluation    PT/OT/SLP Co-Evaluation/Treatment: Yes Reason for Co-Treatment: Complexity of the patient's impairments (multi-system involvement);For patient/therapist safety;To address functional/ADL transfers   OT goals addressed during session: ADL's and self-care  AM-PAC PT "6 Clicks" Daily Activity     Outcome Measure   Help from another person eating meals?: A Lot Help from another person taking care of personal grooming?: A Lot Help from another person toileting, which includes using toliet, bedpan, or urinal?: Total Help from another person bathing (including washing, rinsing, drying)?: A Lot Help from another person to put on and taking off regular upper body clothing?: A Lot Help from another person to put on and taking off regular lower body clothing?: Total 6 Click Score: 10    End of Session Equipment Utilized During Treatment: Cervical collar  OT Visit Diagnosis: Other abnormalities of gait and mobility (R26.89);Muscle weakness (generalized) (M62.81);Pain Pain - Right/Left: Right Pain - part of body: Leg    Activity Tolerance Patient tolerated treatment well   Patient Left with call bell/phone within reach;in chair   Nurse Communication Mobility status;Need for lift equipment        Time: 1610-9604 OT Time Calculation (min): 88 min  Charges: OT General Charges $OT Visit: 1 Visit OT Treatments $Self Care/Home Management : 23-37 mins $Therapeutic Activity: 8-22 mins  Maiah Sinning MSOT, OTR/L Acute Rehab Pager: [phone] Office: [phone]   Theodoro Grist Ying Blankenhorn 07/25/2018, 5:22 PM

## 2018-07-25 NOTE — Progress Notes (Signed)
PROGRESS NOTE    [name] [name]  RDE:[id number] DOB: 05/26/1966 DOA: 06/06/2018 PCP: Helane Rima, MD    Brief Narrative: 52 year old man with past medical history relevant for gout, hypertension anterior cervical corpectomy of C4 on 03/24/2018 with comp gated postoperative course including development of multiple PEs and DVTs started on anticoagulation and subsequently developed hematoma requiring reexploration on 03/28/2018 status post placement of IVC filter who was discharged to skilled nursing facility and had a complicated course including progressive lower extremity weakness, intermittent fevers of unclear etiology and hospitalizations for "pneumonia". Patient was  transferred from [facility], he underwent MRI of C-spine that showed postoperative changes and cord compression with cord edema and concern for ischemia. Patient is status post posterior cervical decompression laminectomy of C3, C4, C5, removal of spinous processes, foraminotomies on 06/14/2018. On 06/22/2018 patient began to develop worsening left sided paresthesias and weakness and a stat MRI showed evidence of postoperative hematoma. Patient was taken to the OR with evacuation of hematoma. Additionally his postoperative course has been complicated by recurrent fevers,bilateral lower extremity DVTs on ultrasound,migratory arthralgias/arthritis that are thought to be rheumatologic in nature as well as significant orthostasis limiting the amount he can work with physical therapy. His arthralgia has since then resolved.   Noted to have decreased left hand grip on 10/19, MRI showed large epidural hematoma, patient s/p clot evacuation.  He was place back on anticoagulation argatroban on 10-22. He was notice to be more bradycardic with HR in the 40 on 10-23. Cardiology was consulted, and argatroban was transition to bivalirrubin to avoid bradycardia. His HR improved after argatroban was discontinued. -HIT panel0.486 (mildly  elevated)- SRA (serotonin release assay) negative. Dr Marin Olp has been helping with patient care. He is recommending to avoid heparin or Lovenox products.   Patient developed weakness of left arm on 10-25. He was take emergently to OR for recurrence of epidural hematoma on 10-25. Anticoagulation was discontinue since 10-25. Dr Saintclair Halsted is recommending for patient to remain off anticoagulation for alt leas 1 week, and if anticoagulation is consider, that will need to be discuss with Dr Saintclair Halsted.  Patient the night of 10-28 develops left arm weakness, numbness. Stat MRI was ordered, which confirm recurrent hematoma. Patient was taken to OR for hematoma evacuation 10-28. He has been off anticoagulation since 10-25. For his thrombocytopenia, a Bone marrow biopsy was done. For LE DVT, he has IVC filter. He cant not be on anticoagulation due to recurrent bleeding.  Patient has received multiple platelet transfusions.  Platelet counts remain stable.    Assessment & Plan:   Principal Problem:   Fever Active Problems:   Myelopathy (HCC)   Benign essential HTN   Cord compression (HCC)   History of pulmonary embolism   Spondylosis, cervical, with myelopathy   Labile blood pressure   Orthostatic hypotension   Leukocytosis   Tachycardia-bradycardia syndrome (HCC)   Adjustment disorder with mixed anxiety and depressed mood  Cervical spine stenosis with lower extremity weakness more on the left compared to the right  S/p cervical decompressive surgery on on 06/14/2018.  S/p evacuation of large hematoma and placement of hemovac drain on 06/22/2018.  S/p clot evaluation on 10/19, 10/25, 10/28.  Neurosurgery on board assisting with management. Working with physical therapy, pt reports no improvement in the strength of the left upper extremity or the lower extremity since 3 or 4 days. Repeat MRI of the spine shows near total evacuation of the hematoma, no cord compression.  Discussed with Dr. Saintclair Halsted  who will see  the patient today.   Anemia of chronic disease:  Transfused 1 unit of prbc, .  Hemoglobin stable at 10 Keep hemoglobin around 7. No obvious signs of bleeding.    Thrombocytopenia/ coagulopathy:  Unclear etiology.  Cardiolipin antibody is negative.  Lupus anti coagulant , homocysteine level and factor VIII WNL.  Protein c is high.  HIT panel mildly elevated. SRA negative.  He underwent a total of 6 units of platelets during the admission and his platelet count this morning is 36,000.  No obvious signs of bleeding BOne marrow biopsy done on 10/30 with non specific findings, suggesting cause possibly from medications.  1 unit of platelets ordered today to keep the platelets around 50,000. Appreciate Dr. Antonieta Pert recommendations.   Near syncope due to orthostatic hypotension:  Appears to have resolved.  From autonomic dysfunction.  Echo showed normal systolic function and grade 2 diastolic dysfunction.  Recommended midodrine and abdominal binder.    Polyarthralgia: improvement with steroids.    Recent PE/ DVT in the setting of spine surgery:  Sp IVC filter placement on July 16 th 2019. Anti coagulation was discontinued due to bleeding episodes.   Hypertension:  Well controlled.    Hyperglycemia from steroids.  A1c is 5. 8%.  Increased the dose of lantus to 45 units and increase the NovoLog to 5 units 3 times daily AC CBG (last 3)  Recent Labs    07/24/18 1706 07/24/18 2104 07/25/18 0818  GLUCAP 261* 145* 131*   No changes to the Lantus dose at this time.   Adjustment disorder with anxiety and depression:  No signs of suicidal ideation.  Psychiatry was consulted and recommendations.    Bilateral 3+ leg and upper extremity edema: He was started on Lasix Lasix to 60 mg BID and diuresed about 16 L since admission and the swelling in the upper and lower extremities has improved.  Decreased the dose of lasix to 40 mg daily.    Stage II pressure ulcer on the right  ischial tuberosity Wound care consulted and recommendations given.  Productive cough for the last 2 days Get chest x-ray for evaluation of pneumonia.   DVT prophylaxis: compression stockings  Code Status: full code.  Family Communication: none at bedside.  Disposition Plan: further management as per neuro surgery and oncology.    Consultants:   Neuro surgery Dr. Saintclair Halsted  Hematology/oncology Dr. Marin Olp   Procedures: None   Antimicrobials: none.    Subjective: Reports cough has changed from dry cough to productive over the last couple of days.  But he denies any shortness of breath or chest pain.   Objective: Vitals:   07/24/18 1922 07/24/18 2000 07/24/18 2358 07/25/18 0800  BP: (!) 138/96  (!) 126/95   Pulse:  88  77  Resp:  18  16  Temp: (!) 97.5 F (36.4 C)  98.3 F (36.8 C)   TempSrc: Oral  Oral   SpO2:  98%  (!) 81%  Weight:      Height:        Intake/Output Summary (Last 24 hours) at 07/25/2018 1108 Last data filed at 07/25/2018 0800 Gross per 24 hour  Intake 840 ml  Output 1751 ml  Net -911 ml   Filed Weights   06/22/18 1641 07/07/18 1000 07/18/18 0600  Weight: 123.8 kg 123.8 kg 135 kg    Examination:  General exam: Alert and comfortable, not in any kind of distress Respiratory system: Clear to auscultation bilaterally, no wheezing or  rhonchi Cardiovascular system: S1 & S2 heard, RRR.  No JVD.  No murmur Gastrointestinal system: Abdomen is soft, nontender, nondistended, bowel sounds are good Central nervous system: Alert and oriented. Strength in the left upper extremity is 1/5, LE strength is 0/5 Extremities: Upper and lower extremity edemais improving Skin: No rashes, lesions or ulcers Psychiatry: Mood & affect appropriate.     Data Reviewed: I have personally reviewed following labs and imaging studies  CBC: Recent Labs  Lab 07/21/18 0500 07/22/18 0500 07/23/18 0455 07/24/18 0440 07/25/18 0508  WBC 4.8 3.9* 2.7* 2.8* 3.5*    NEUTROABS 4.2 3.4 2.0 2.1 2.7  HGB 10.3* 10.2* 10.2* 9.9* 10.1*  HCT 33.8* 33.4* 32.7* 32.6* 32.9*  MCV 96.6 97.7 97.0 97.6 97.1  PLT 35* 70* 54* 44* 36*   Basic Metabolic Panel: Recent Labs  Lab 07/20/18 1449 07/21/18 1840 07/22/18 0500 07/23/18 0455 07/24/18 0440  NA 140 141 141 143 143  K 3.9 3.5 3.5 3.4* 3.8  CL 98 101 100 100 102  CO2 34* 32 35* 33* 35*  GLUCOSE 254* 313* 230* 205* 256*  BUN 38* 42* 46* 50* 52*  CREATININE 0.83 0.79 0.70 0.83 0.75  CALCIUM 9.5 8.8* 9.2 9.0 9.4   GFR: Estimated Creatinine Clearance: 147.3 mL/min (by C-G formula based on SCr of 0.75 mg/dL). Liver Function Tests: Recent Labs  Lab 07/21/18 1840 07/22/18 0500 07/23/18 0455 07/24/18 0440  AST _0 ALT _1 ALKPHOS 71 57 55 54  BILITOT 0.7 0.9 0.9 0.8  PROT 5.4* 5.6* 5.3* 5.7*  ALBUMIN 3.2* 3.2* 3.1* 3.0*   No results for input(s): LIPASE, AMYLASE in the last 168 hours. No results for input(s): AMMONIA in the last 168 hours. Coagulation Profile: Recent Labs  Lab 07/19/18 0535 07/21/18 0500  INR 1.23 1.13   Cardiac Enzymes: No results for input(s): CKTOTAL, CKMB, CKMBINDEX, TROPONINI in the last 168 hours. BNP (last 3 results) No results for input(s): PROBNP in the last 8760 hours. HbA1C: No results for input(s): HGBA1C in the last 72 hours. CBG: Recent Labs  Lab 07/24/18 0716 07/24/18 1216 07/24/18 1706 07/24/18 2104 07/25/18 0818  GLUCAP 256* 336* 261* 145* 131*   Lipid Profile: No results for input(s): CHOL, HDL, LDLCALC, TRIG, CHOLHDL, LDLDIRECT in the last 72 hours. Thyroid Function Tests: No results for input(s): TSH, T4TOTAL, FREET4, T3FREE, THYROIDAB in the last 72 hours. Anemia Panel: No results for input(s): VITAMINB12, FOLATE, FERRITIN, TIBC, IRON, RETICCTPCT in the last 72 hours. Sepsis Labs: No results for input(s): PROCALCITON, LATICACIDVEN in the last 168 hours.  Recent Results (from the past 240 hour(s))  MRSA PCR Screening      Status: None   Collection Time: 07/21/18  3:41 PM  Result Value Ref Range Status   MRSA by PCR NEGATIVE NEGATIVE Final    Comment:        The GeneXpert MRSA Assay (FDA approved for NASAL specimens only), is one component of a comprehensive MRSA colonization surveillance program. It is not intended to diagnose MRSA infection nor to guide or monitor treatment for MRSA infections. Performed at [facility], [place] [address], [place], [place] [address]          Radiology Studies: No results found.      Scheduled Meds: . sodium chloride   Intravenous Once  . clonazePAM  0.5 mg Oral BID  . dexamethasone  6 mg Intravenous Q12H  . docusate sodium  100 mg Oral  Daily  . famotidine  20 mg Oral BID  . furosemide  40 mg Intravenous Daily  . gabapentin  300 mg Oral TID  . Gerhardt's butt cream   Topical BID  . Influenza vac split quadrivalent PF  0.5 mL Intramuscular Tomorrow-1000  . insulin aspart  0-15 Units Subcutaneous TID WC  . insulin aspart  0-5 Units Subcutaneous QHS  . insulin aspart  5 Units Subcutaneous TID WC  . insulin glargine  45 Units Subcutaneous QHS  . magic mouthwash w/lidocaine  5 mL Oral TID  . methocarbamol  750 mg Oral QID  . midodrine  5 mg Oral BID WC  . nystatin  5 mL Oral TID AC & HS  . protein supplement shake  11 oz Oral QID  . romiPLOStim  250 mcg Subcutaneous Q Thu  . senna-docusate  1 tablet Oral QHS  . sodium chloride flush  10-40 mL Intracatheter Q12H   Continuous Infusions: . sodium chloride Stopped (07/22/18 2045)  . lactated ringers Stopped (07/20/18 0900)     LOS: 48 days    Time spent: 25 minutes.     Hosie Poisson, MD [facility] Pager [phone]  If 7PM-7AM, please contact night-coverage [email or web address] Password [email or web address] 07/25/2018, 11:08 AM

## 2018-07-25 NOTE — Progress Notes (Signed)
Pharmacy Antibiotic Note  [name] [name] is a 52 y.o. male with esophageal candidiasis.   Plan: Fluconazole 400 mg iv q24h (due to patient's weight will not decease daily dose)  Height: 5\' 9"  (175.3 cm) Weight: 297 lb 9.9 oz (135 kg) IBW/kg (Calculated) : 70.7  Temp (24hrs), Avg:97.9 F (36.6 C), Min:97.5 F (36.4 C), Max:98.3 F (36.8 C)  Recent Labs  Lab 07/20/18 1449 07/21/18 0500 07/21/18 1840 07/22/18 0500 07/23/18 0455 07/24/18 0440 07/25/18 0508  WBC  --  4.8  --  3.9* 2.7* 2.8* 3.5*  CREATININE 0.83  --  0.79 0.70 0.83 0.75  --     Estimated Creatinine Clearance: 147.3 mL/min (by C-G formula based on SCr of 0.75 mg/dL).    Allergies  Allergen Reactions  . Nsaids Other (See Comments)    G6PD DEFICIENCY  . Sulfa Antibiotics Other (See Comments)    G6PD DEFICIENCY  . Protonix [Pantoprazole Sodium] Diarrhea    Severe diarrhea, Upset stomach   Isaac Bliss, PharmD, BCPS, BCCCP Clinical Pharmacist [phone]  Please check AMION for all [facility] Pharmacy numbers  07/25/2018 4:34 PM

## 2018-07-25 NOTE — Progress Notes (Signed)
Physical Therapy Treatment Patient Details Name: [name] [name] MRN: [id number] DOB: [birth year] Today's Date: 07/25/2018    History of Present Illness 52 y.o. male admitted on 06/06/18 for bil LE weakness and muscle spasm after multiple falls (with resultant R knee meniscus tear placed in bledsoe brace) following a recent ACDF at [facility] in July 2019 (complicated by PE (s/p IVC filter), CIR, and then SNF placement before finally returning home with his parents).  In the ED MRI of c-spine which showed post operative changes and cord edema and possibility of ischemia.  His R knee was aspirated on 06/09/18.  S/p posterior cervical decompression and fusion on 06/14/18. Entire acute course complicated by hypotension in standing (thought to be autonomic) and by finding of multiple LE DVTs (already has an IVC filter in place, but per MDs notes at risk for blocking the filter with these clots).  Pt with post op issues with incisional bleeding s/p 2 evacuations of hematomas and placement of hemovac (06/22/18 and 07/01/18) as well as multiple units of plasma.  Cardiology consulted 07/05/18 due to bradycardia thought to be from his newly started blood thinner.  Pt with another re-accumulation of epidural hematoma with increased L sided weakness and cord compression with emergent evacuation surgery 10/25 & 07/10/18.  Pt also needing more platelets post op.  Bone marrow bx 07/12/18. PMHx of HTN, gout, anemia, DDD (lumbar)    PT Comments    Worked in conjunction with OT today on sitting EOB, working on upright posture, head in midline, using his scapula to help with his posture bringing his ribs up off of his pelvis.  He was in poor spirits initially, but perked up as PT /OT worked with him, Banker music during his session.  We had a discussion re: WC and why I have not brought him a WC (our rehab does not have a tilt in space big enough).  I may contact one of our more frequently used WC vendors and see what they  think about a loaner, but with d/c plan still very up in the air he may not be able to know which vendor to go with yet as we don't know where he will do his post acute rehab.  PT will continue to follow acutely for safe mobility progression  Follow Up Recommendations  SNF     Equipment Recommendations  Wheelchair (measurements PT);Wheelchair cushion (measurements PT);Hospital bed;Other (comment)(22x20 power chair, tilt in space, pressure cushion, hoyer)    Recommendations for Other Services   NA     Precautions / Restrictions Precautions Precautions: Fall;Cervical Precaution Comments: quadriparesis Required Braces or Orthoses: Cervical Brace Knee Immobilizer - Right: On when out of bed or walking Cervical Brace: Hard collar;At all times Other Brace/Splint: Use locked bledsoe RLE when and if he can start attempting to stand again.   Restrictions Weight Bearing Restrictions: No    Mobility  Bed Mobility Overal bed mobility: Needs Assistance Bed Mobility: Rolling;Sidelying to Sit;Sit to Sidelying Rolling: +2 for physical assistance;Total assist Sidelying to sit: +2 for physical assistance;Total assist     Sit to sidelying: +2 for physical assistance;Total assist General bed mobility comments: Two person total assist to roll bil and come to sitting EOB.  Pt able to reach with RUE to assist in rolling left.  Sat EOB and then positioned back in supine to lift OOB to chair.  Pt had a large BM and had to roll bil multiple times for peri care.    Transfers Overall transfer  level: Needs assistance   Transfers: Sit to/from Stand           General transfer comment: Maxi move utilized for OOB time.           Balance Overall balance assessment: Needs assistance Sitting-balance support: Feet supported;Bilateral upper extremity supported;No upper extremity supported;Single extremity supported Sitting balance-Leahy Scale: Zero Sitting balance - Comments: max to total assist,  worked in sitting on holding head in upright alignment.                                    Cognition Arousal/Alertness: Awake/alert Behavior During Therapy: WFL for tasks assessed/performed Overall Cognitive Status: Within Functional Limits for tasks assessed                                 General Comments: Good affect today and demonstrating increased motivation. Enjoyed [employer] during session (at the end of the session, still very flat when initially entering the room).       Exercises General Exercises - Lower Extremity Ankle Circles/Pumps: AROM;Right;5 reps(unable left) Long Arc Quad: AROM;Right;5 reps(unable left) Hip Flexion/Marching: AAROM;Right;5 reps(unable left) Other Exercises Other Exercises: sitting balance training at EOB with Max-Total A. Other Exercises: Facilitation of upright posture and requiring Max-Total to bring shoulder back. 5 reps. sitting at EOB, cervical collar adjusted to be more supportive in sitting, attempted to practice right elbow down to bed and facilitating pushing back up to sitting with total assist, but pt reported that bothered his low back (h/o low back pain and issues).      General Comments General comments (skin integrity, edema, etc.): VSS throughout, no distress noted in pt's appearance and tolerance of activity.       Pertinent Vitals/Pain Pain Assessment: Faces Faces Pain Scale: Hurts little more Pain Location: left side "burning" and neck  Pain Descriptors / Indicators: Burning Pain Intervention(s): Limited activity within patient's tolerance;Monitored during session;Repositioned           PT Goals (current goals can now be found in the care plan section) Acute Rehab PT Goals Patient Stated Goal: wants answers about his medical issues Progress towards PT goals: Progressing toward goals    Frequency    Min 2X/week      PT Plan Current plan remains appropriate    Co-evaluation PT/OT/SLP  Co-Evaluation/Treatment: Yes Reason for Co-Treatment: Complexity of the patient's impairments (multi-system involvement);For patient/therapist safety;To address functional/ADL transfers PT goals addressed during session: Mobility/safety with mobility;Balance;Strengthening/ROM OT goals addressed during session: ADL's and self-care      AM-PAC PT "6 Clicks" Daily Activity  Outcome Measure  Difficulty turning over in bed (including adjusting bedclothes, sheets and blankets)?: Unable Difficulty moving from lying on back to sitting on the side of the bed? : Unable Difficulty sitting down on and standing up from a chair with arms (e.g., wheelchair, bedside commode, etc,.)?: Unable Help needed moving to and from a bed to chair (including a wheelchair)?: Total Help needed walking in hospital room?: Total Help needed climbing 3-5 steps with a railing? : Total 6 Click Score: 6    End of Session Equipment Utilized During Treatment: Cervical collar Activity Tolerance: Patient tolerated treatment well Patient left: in chair;with call bell/phone within reach   PT Visit Diagnosis: Other abnormalities of gait and mobility (R26.89);Other symptoms and signs involving the nervous system (R29.898);Muscle weakness (  generalized) (M62.81)     Time: 1610-9604 PT Time Calculation (min) (ACUTE ONLY): 89 min  Charges:  $Therapeutic Activity: 38-52 mins          Medina Degraffenreid B. Tranice Laduke, PT, DPT  Acute Rehabilitation [phone] pager #[phone] office            07/25/2018, 5:53 PM

## 2018-07-25 NOTE — Consult Note (Signed)
WOC Nurse wound consult note Patient evaluated [facility] with the assistance of his primary RN.  No family present. 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  It measures 2 cm x 1.2 cm x no measurable depth.  The wound bed is 100% clean, pink.  Surrounding tissue is macerated from frequent loose stools; not loose enough for a fecal containment device however.  For this area, Place a small amount of Gerhardt's butt cream and cover with a foam dressing.  Change daily.  The right buttock, close to the gluteal fold, has two distinct linear areas that have been impacted by moisture, friction, shear, and fecal incontinence.  They are both superficial and 100% pink, and should have Gerhardt's butt cream applied, and covered with a foam dressing in an effort to protect from the destructive nature of loose stools.  Monitor the wound area(s) for worsening of condition such as: Signs/symptoms of infection,  Increase in size,  Development of or worsening of odor, Development of pain, or increased pain at the affected locations.  Notify the medical team if any of these develop.  Thank you for the consult.  Discussed plan of care with the patient and bedside nurse.  WOC nurse will not follow at this time.  Please re-consult the WOC team if needed.  Helmut MusterSherry Kyisha Fowle, RN, MSN, CWOCN, CNS-BC, pager [phone]

## 2018-07-26 ENCOUNTER — Inpatient Hospital Stay (HOSPITAL_COMMUNITY)

## 2018-07-26 ENCOUNTER — Encounter (HOSPITAL_COMMUNITY): Payer: Self-pay | Admitting: Radiology

## 2018-07-26 DIAGNOSIS — R600 Localized edema: Secondary | ICD-10-CM

## 2018-07-26 LAB — BASIC METABOLIC PANEL
ANION GAP: 6 (ref 5–15)
BUN: 57 mg/dL — ABNORMAL HIGH (ref 6–20)
CHLORIDE: 102 mmol/L (ref 98–111)
CO2: 34 mmol/L — AB (ref 22–32)
Calcium: 9.3 mg/dL (ref 8.9–10.3)
Creatinine, Ser: 0.74 mg/dL (ref 0.61–1.24)
GFR calc non Af Amer: >60 mL/min (ref >60)
GLUCOSE: 164 mg/dL — AB (ref 70–99)
Potassium: 3.2 mmol/L — ABNORMAL LOW (ref 3.5–5.1)
Sodium: 142 mmol/L (ref 135–145)

## 2018-07-26 LAB — BPAM PLATELET PHERESIS
BLOOD PRODUCT EXPIRATION DATE: 201911142359
ISSUE DATE / TIME: 201911121615
UNIT TYPE AND RH: 6200

## 2018-07-26 LAB — CBC WITH DIFFERENTIAL/PLATELET
Basophils Absolute: 0 10*3/uL (ref 0.0–0.1)
Basophils Relative: 0 %
EOS ABS: 0.7 10*3/uL — AB (ref 0.0–0.5)
Eosinophils Relative: 17 %
HCT: 31.9 % — ABNORMAL LOW (ref 39.0–52.0)
Hemoglobin: 9.8 g/dL — ABNORMAL LOW (ref 13.0–17.0)
LYMPHS ABS: 0.6 10*3/uL — AB (ref 0.7–4.0)
LYMPHS PCT: 14 %
MCH: 29.8 pg (ref 26.0–34.0)
MCHC: 30.7 g/dL (ref 30.0–36.0)
MCV: 97 fL (ref 80.0–100.0)
MONOS PCT: 8 %
Monocytes Absolute: 0.3 10*3/uL (ref 0.1–1.0)
Neutro Abs: 2.6 10*3/uL (ref 1.7–7.7)
Neutrophils Relative %: 61 %
PLATELETS: 27 10*3/uL — AB (ref 150–400)
RBC: 3.29 MIL/uL — ABNORMAL LOW (ref 4.22–5.81)
RDW: 16.9 % — AB (ref 11.5–15.5)
WBC: 4.2 10*3/uL (ref 4.0–10.5)
nRBC: 0.5 % — ABNORMAL HIGH (ref 0.0–0.2)

## 2018-07-26 LAB — GLUCOSE, CAPILLARY
GLUCOSE-CAPILLARY: 218 mg/dL — AB (ref 70–99)
GLUCOSE-CAPILLARY: 167 mg/dL — AB (ref 70–99)
Glucose-Capillary: 126 mg/dL — ABNORMAL HIGH (ref 70–99)
Glucose-Capillary: 147 mg/dL — ABNORMAL HIGH (ref 70–99)

## 2018-07-26 LAB — RETICULOCYTES
Immature Retic Fract: 13.2 % (ref 2.3–15.9)
RBC.: 3.27 MIL/uL — ABNORMAL LOW (ref 4.22–5.81)
RETIC CT PCT: 1.3 % (ref 0.4–3.1)
Retic Count, Absolute: 41.9 10*3/uL (ref 19.0–186.0)

## 2018-07-26 LAB — PREPARE PLATELET PHERESIS: Unit division: 0

## 2018-07-26 LAB — APTT: APTT: 24 s (ref 24–36)

## 2018-07-26 LAB — LACTATE DEHYDROGENASE: LDH: 398 U/L — ABNORMAL HIGH (ref 98–192)

## 2018-07-26 MED ORDER — IOPAMIDOL (ISOVUE-370) INJECTION 76%
INTRAVENOUS | Status: AC
  Filled 2018-07-26: qty 100

## 2018-07-26 MED ORDER — SODIUM CHLORIDE 0.9% IV SOLUTION
Freq: Once | INTRAVENOUS | Status: AC
  Administered 2018-07-26: 12:00:00 via INTRAVENOUS

## 2018-07-26 MED ORDER — IOPAMIDOL (ISOVUE-370) INJECTION 76%
100.0000 mL | Freq: Once | INTRAVENOUS | Status: AC | PRN
  Administered 2018-07-26: 100 mL via INTRAVENOUS

## 2018-07-26 MED ORDER — POTASSIUM CHLORIDE CRYS ER 20 MEQ PO TBCR
40.0000 meq | EXTENDED_RELEASE_TABLET | Freq: Once | ORAL | Status: AC
  Administered 2018-07-26: 40 meq via ORAL
  Filled 2018-07-26: qty 2

## 2018-07-26 NOTE — Progress Notes (Signed)
Patient ID: [name] [name], male   DOB: Jun 21, 1966, 52 y.o.   MRN: [id number]   Late entry note:  So the patient around yesterday afternoon and overall spirits seem to be okay still show signs of depression. Neurologic function essentially unchanged still right upper extremity and right lower extremityweakness but much greater on the left with a weak left grip and minimal left lower extremity movement however status to wiggle his toes. All this is stable and equal to his baseline since last week.  I reviewed the MRI scan of her cervical spine which overall shows significant improvement. Much decreased signal change within the spinal cord which hopefully imparts and improved prognosis. I went over this and explained to him in depth discussed it with he and his father on the phone.  [facility] which I told him I have no problem with. Primarily I think his questions center around both neurologic recovery but also hematologic abnormalities. Certainly if the patient requests and the  [facility] can find a medical team to accept him I can get word to the department neurosurgery for consultation. However I recommended to him and I strongly feel that from a neurologic perspective, there is no neurosurgical procedure he should have on his neck definitely now and hopefully never due to his high risk of bleeding, locations that we've lived through since his posterior cervical decompression.I think with the improvement in the appearance of the spinal cord on his MRI scan from the weekend that we need to focus on his rehabilitation and may be attention should be directed towards rehabilitation placement here or possible [facility].

## 2018-07-26 NOTE — Progress Notes (Signed)
Unfortunately, [name] [name]'s platelets are still not going up.  I am still not sure exactly why they are not going up.  I would not think that this is myelodysplasia given that his bone marrow cytogenetics were normal.  His LDH is trending upward.  I am going to give him another transfusion of platelets.  I think we have to given that his platelet count was down to 27,000.  His LDH is now close to 400.  His hemoglobin is 9.8.  His creatinine is 0.7.  I would not think that this is some variant of TTP.  I am rechecking an ADAMTS13 level.  We will see what his fibrinogen level is.  He has been diuresed incredibly well.  His intake and output are now about 16L behind.  This I think is helpful for him.  I really cannot find a unifying diagnosis with [name] [name].  He had the bleeding but yet has the thromboembolic issues.  I think it might be reasonable to recheck his CT angiogram of the lung and Dopplers of his legs so we can see how his thrombo-embolic issues are doing.  He is eating okay.  He is having no problems with diarrhea.  There is been no bleeding otherwise.  I am not sure how well he is doing with physical therapy.  I know he is getting out of bed but is only able to do this with a lift.  When corrected for his hemoglobin, his reticulocyte count is clearly not high.  As such, I would not think that he is hemolyzing.  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  I may want to consider IVIG on him.  This is definitely incredibly complex.  I have to applaud [name] [name] for him hanging in as well as he is doing.  I have to give a ton of credit for all the staff up on 4 NP for trying to give him the best quality of life possible.  His exam still is not revealing.  His vital signs show temperature of 97 8.  Pulse 87.  Blood pressure 136/96.  Oxygen saturation is 95%.  His head and neck exam shows no scleral icterus.  He has no oral lesions.  There is no obvious adenopathy in the neck.   His lungs still sound pretty good.  Cardiac exam is regular rate and rhythm.  He has no murmurs or rubs.  Abdomen is obese but soft.  Bowel sounds are present.  There is no guarding or rebound tenderness.  His extremities still shows the weakness and on the left side.  There is very little edema in his arms or legs.  Neurological exam shows the weakness as stated before.  Skin exam shows no rashes.  I cannot find any petechia or ecchymoses.  We are still trying hard to improve [name] [name].  This is incredibly challenging.  Again I have to give him a lot of credit for having a good attitude.  Again, we will see about a CT angiogram of his chest to see how his thrombotic burden is.  We will see about a Doppler of his legs so we will see how the thrombotic burden is with his legs.  I will strongly consider IVIG.  I will give him another dose of Nplate tomorrow.  Christin BachPete Ennever, MD  1 Thessalonians 5:14

## 2018-07-26 NOTE — Progress Notes (Signed)
PROGRESS NOTE    [name] [name]  CBS:[id number] DOB: April 03, 1966 DOA: 06/06/2018 PCP: Helane Rima, MD    Brief Narrative: Brief Narrative:  52 year old man with past medical history relevant for gout, hypertension anterior cervical corpectomy of C4 on 03/24/2018 with comp gated postoperative course including development of multiple PEs and DVTs started on anticoagulation and subsequently developed hematoma requiring reexploration on 03/28/2018 status post placement of IVC filter who was discharged to skilled nursing facility and had a complicated course including progressive lower extremity weakness, intermittent fevers of unclear etiology and hospitalizations for "pneumonia". Patient was admitted to this hospital is a transfer from [facility] with MRI of C-spine that showed postoperative changes and cord compression with cord edema and concern for ischemia. Patient is status post posterior cervical decompression laminectomy of C3, C4, C5, removal of spinous processes, foraminotomies on 06/14/2018. On 06/22/2018 patient began to develop worsening left sided paresthesias and weakness and a stat MRI showed evidence of postoperative hematoma. Patient was taken to the OR with evacuation of hematoma. Additionally his postoperative course has been complicated by recurrent fevers,bilateral lower extremity DVTs on ultrasound,migratory arthralgias/arthritis that are thought to be rheumatologic in nature as well as significant orthostasis limiting the amount he can work with physical therapy. His arthralgia has resolved.   Noted to have decreased left hand grip on 10/19, MRI showed large epidural hematoma, patient s/p clot evacuation.  He was place back on anticoagulation argatroban on 10-22. He was notice to be more bradycardic with HR in the 40 on 10-23. Cardiology was consulted, and argatroban was transition to bivalirrubin to avoid bradycardia. His HR improved after argatroban was discontinue.  -HIT panel 0.486 (mildly elevated)- SRA (serotonin release assay) negative. Dr Marin Olp has been helping with patient care. He is recommending to avoid heparin or Lovenox products.   Patient developed weakness of left arm on 10-25. He was take emergently to OR for recurrence of epidural hematoma on 10-25. Anticoagulation was discontinue since 10-25. Dr Saintclair Halsted is recommending for patient to remain off anticoagulation for alt leas 1 week, and if anticoagulation is consider, that will need to be discuss with DR Saintclair Halsted.  Patient the night of 10-28 develops left arm weakness, numbness. Stat MRI was ordered, which confirm recurrent hematoma. Patient was taken to OR for hematoma evacuation 10-28. He has been off anticoagulation since 10-25. For his thrombocytopenia, a Bone marrow biopsy was done. For LE DVT, he has IVC filter. He cannot be on anticoagulation due to recurrent bleeding.  Patient has received multiple platelet transfusions.      Assessment & Plan:   Principal Problem:   Fever Active Problems:   Myelopathy (HCC)   Benign essential HTN   Cord compression (HCC)   History of pulmonary embolism   Spondylosis, cervical, with myelopathy   Labile blood pressure   Orthostatic hypotension   Leukocytosis   Tachycardia-bradycardia syndrome (HCC)   Adjustment disorder with mixed anxiety and depressed mood     Cervical spine stenosis with lower extremity weakness -Status post cervical decompressive surgery on 06/14/2018 -Status post evacuation of large hematoma and placement of Hemovac drain on 06/22/2018 -Patient with decreased left sided hand grip and weakness on 10/19; MRI C-spine showed large epidural hematoma -S/p OR for clot evacuation on 10/19 and 10-25 and 10-28 -Patient has required multiples surgical evacuation of epidural hematoma.  -MRI done on 11/10 shows improvement in the fluid collection.  Other findings as per neurosurgery. Neurosurgery continues to follow.  He does not appear  like it is any further plans for surgeries at this time.  Dexamethasone dose is being slowly tapered down.  Continues to have weakness in the left upper and lower extremities.  Focus needs to be on rehabilitation at this point in time.  Normocytic anemia Patient did get 1 unit of PRBC earlier during this hospitalization.  Hemoglobin has been stable.  No evidence of overt bleeding.  Thrombocytopenia/coagulopathy -Cardiolipin antibody negative -Lupus anticoagulant, homocysteine level, factor VIII within normal limits -Protein S activity 60 (low) -Protein C total >200; Activity 199 (high)  -HIT panel 0.486 (mildly elevated)- SRA (serotonin release assay) negative -immature platelet fraction WNL -Was given 2u platelets on 10/19 in the OR. And 2 units 10-25 for OR. Platelet transfusion 10/29 Patient transfused platelets and given vitamin K on 10/30. Patient underwent bone marrow biopsy on 10/30.  Pathology report suggests nonspecific findings.   Hematology suspects that some of his coagulopathy and platelet low platelet counts are likely due to medications.  Blood counts noted to be lower this morning.  Hematology continues to follow.  Additional platelet transfusion has been ordered.  Orthostatic hypotension with near syncope Thought to be related to hypovolemia and autonomic dysfunction.  Echocardiogram July 2019 showed a normal systolic function, grade 2 diastolic dysfunction.  No significant valve abnormalities noted. Patient was placed on midodrine.  Midodrine dose was decreased due to bradycardia.  Abdominal binder.  Seems to be stable.  Sinus bradycardia Patient initially developed bradycardia on 10/23.  He was asymptomatic.  TSH was 1.24.  Cardiology was consulted at that time.  There was no indication for pacemaker.  His heart rate stabilized and improved.  He again became bradycardic on 11/1.  EKG shows sinus bradycardia.  Telemetry did not show any pauses.  Dose of midodrine was  decreased as midodrine can sometimes cause bradycardia.  Autonomic dysfunction is another possible etiology.  Avoid AV nodal blocking agents.  Heart rate has been stable.  Polyarthralgia/persistent fever -At the time of admission patient had symptoms in the shoulders and ankles, along with a history of gout and pain in his knees.  He underwent arthrocentesis however no evidence of infection was found. -He was noted to have elevated inflammatory markers including CCP, CRP.  However ANCA, aldolase, ANA were negative. -Previous rounding physicians have discussed with Dr. Kathlene November, rheumatologist, he will follow-up with the patient as an outpatient. -Patient was placed on prednisone however currently on dexamethasone (neurosurgery) No further episodes of fever.  Patient was on doxycycline for unclear reason and this was discontinued on 10/31.    Recent PE /DVT in setting of spine surgery -Status post IVC filter placement on March 28, 2018 -Patient was on apixaban and changed over to IV heparin.  However these agents had to be discontinued due to recurrent episodes of hematoma at the surgical site.  -Vascular DVT US 06/26/2018 showed bilateral lower extremity DVTs likely acute -Case was discussed with  vascular surgery, Dr. Trula Slade.  Also discussed with interventional radiology.  Unfortunately patient does not have any good options vis--vis either thrombolysis or mechanical thrombectomy. -Bivalirudin discontinue by neurosurgery. Serotonin release assay negative.  Remains off of anticoagulation due to bleeding episodes and severe thrombocytopenia  Upper and lower extremity edema Improved with diuretics.  Continue to monitor.  Gout Stable.  Continue Uloric  Acute renal failure/hypokalemia This has resolved.  Renal function is normal.  Electrolytes are normal.  Replace potassium  Essential hypertension  Amlodipine discontinue due to orthostatic hypotension and edema.  Blood pressure remains  stable.   Some degree of hypertension likely due to midodrine.  Hyperglycemia secondary to steroid CBGs have improved.  Steroid is being tapered down.  May need to decrease the dose of Lantus.  HbA1c 5.8.  Adjustment disorder with depression and anxiety Patient noted to have flat affect and depressed mood and so psychiatry was consulted.  They recommended changing Xanax to Klonopin on a scheduled basis.  They also recommended sertraline.  He was changed over from alprazolam to Klonopin however did not start him on sertraline due to his hematological issues.    Abnormal CT findings CT scan raised concern for pneumonia but patient respiratory symptoms.  Patient did not have any respiratory symptoms.  Gallstones were noted as well.  These results were communicated to the patient.  Patient apparently had productive cough for the last 2 days.  X-ray did not show any infiltrates.    Stage II pressure ulcer on the right ischial tuberosity Wound care nurse is following.  DVT prophylaxis: TED stockings Code Status: Full code Family Communication: Discussed with the patient Disposition Plan: Patient remains stable.  No further surgeries planned by neurosurgery.  Hematology/oncology continues to follow for low platelet counts.  PT and OT continues to follow patient.    Consultants:   Neurosurgery  Hematology  Cardiology    Procedures: On 10/2 1. posterior cervical decompressive laminectomy at C3, C4, C5 with removal of the spinous process and complete laminectomy and foraminotomies of the C3, C4, C5 nerve roots. 2. Posterior cervical fusion with lateral mass screws at C3-C4-C5 and pars screws at C2 lies in the globus ellipseLateral mass screw system 3. Posterior lateral arthrodesis C2-C5 utilizing locally harvested autograft mixed with vivigen  06/22/2018: Procedure reexploration posterior cervical wound for evacuation of epidural hematoma  06/26/2018 DVT ultrasound:Right: Findings consistent  with acute deep vein thrombosis involving the right common femoral vein, right femoral vein, right popliteal vein, right posterior tibial vein, and right peroneal vein. Thrombosis extends proximally into external iliac vein.  Proximal iliac vein not visualized. Left: Findings consistent with acute deep vein thrombosis involving the left common femoral vein, left femoral vein, left popliteal vein, left posterior tibial vein, and left peroneal vein. Findings consistent with acute superficial vein thrombosis  involving the left great saphenous vein. Thrombosis extends proximally into external iliac vein. Proximal iliac vein not visualized.  07/01/18 Clot evacuation   RUE doppler 07/02/18    Antimicrobials:  Ancef.   Subjective: Patient feels about the same.  Still having difficulty moving his left arm and leg.  Objective: Vitals:   07/25/18 2300 07/26/18 0100 07/26/18 0400 07/26/18 0749  BP: 117/83  135/90 117/90  Pulse: 97 92 81 80  Resp: (!) 23 (!) 21 (!) 26 18  Temp: 98.6 F (37 C)  98.7 F (37.1 C) 97.8 F (36.6 C)  TempSrc: Oral  Oral Oral  SpO2: 92% 93% 95% 97%  Weight:      Height:        Intake/Output Summary (Last 24 hours) at 07/26/2018 1138 Last data filed at 07/26/2018 0800 Gross per 24 hour  Intake 2070 ml  Output 750 ml  Net 1320 ml   Filed Weights   06/22/18 1641 07/07/18 1000 07/18/18 0600  Weight: 123.8 kg 123.8 kg 135 kg    Examination:  General exam: He is awake alert.  In no distress Respiratory system: Normal effort at rest.  Clear to auscultation bilaterally Cardiovascular system: S1-S2 is normal regular.  No S3-S4.   Gastrointestinal system:  Abdomen soft.  Nontender nondistended Improved edema bilateral upper and lower extremities.    Data Reviewed: I have personally reviewed following labs and imaging studies  CBC: Recent Labs  Lab 07/22/18 0500 07/23/18 0455 07/24/18 0440 07/25/18 0508 07/26/18 0600  WBC 3.9* 2.7* 2.8* 3.5* 4.2   NEUTROABS 3.4 2.0 2.1 2.7 2.6  HGB 10.2* 10.2* 9.9* 10.1* 9.8*  HCT 33.4* 32.7* 32.6* 32.9* 31.9*  MCV 97.7 97.0 97.6 97.1 97.0  PLT 70* 54* 44* 36* 27*   Basic Metabolic Panel: Recent Labs  Lab 07/21/18 1840 07/22/18 0500 07/23/18 0455 07/24/18 0440 07/26/18 0600  NA 141 141 143 143 142  K 3.5 3.5 3.4* 3.8 3.2*  CL 101 100 100 102 102  CO2 32 35* 33* 35* 34*  GLUCOSE 313* 230* 205* 256* 164*  BUN 42* 46* 50* 52* 57*  CREATININE 0.79 0.70 0.83 0.75 0.74  CALCIUM 8.8* 9.2 9.0 9.4 9.3   GFR: Estimated Creatinine Clearance: 147.3 mL/min (by C-G formula based on SCr of 0.74 mg/dL).  Coagulation Profile: Recent Labs  Lab 07/21/18 0500  INR 1.13   CBG: Recent Labs  Lab 07/25/18 0818 07/25/18 1117 07/25/18 1737 07/25/18 2129 07/26/18 0752  GLUCAP 131* 134* 141* 206* 126*      Radiology Studies: Dg Chest Port 1 View  Result Date: 07/25/2018 CLINICAL DATA:  Cough EXAM: PORTABLE CHEST 1 VIEW COMPARISON:  July 20, 2018 FINDINGS: There is no edema or consolidation. Heart is upper normal in size with pulmonary vascularity normal. No adenopathy. Central catheter tip is in the superior vena cava. No pneumothorax. No bone lesions evident. IMPRESSION: No edema or consolidation. Stable cardiac silhouette. Central catheter tip in superior vena cava. Electronically Signed   By: Lowella Grip III M.D.   On: 07/25/2018 14:41        Scheduled Meds: . sodium chloride   Intravenous Once  . clonazePAM  0.5 mg Oral BID  . dexamethasone  4 mg Intravenous Q12H  . furosemide  40 mg Intravenous Daily  . gabapentin  300 mg Oral TID  . Gerhardt's butt cream   Topical BID  . Influenza vac split quadrivalent PF  0.5 mL Intramuscular Tomorrow-1000  . insulin aspart  0-15 Units Subcutaneous TID WC  . insulin aspart  0-5 Units Subcutaneous QHS  . insulin aspart  5 Units Subcutaneous TID WC  . insulin glargine  45 Units Subcutaneous QHS  . magic mouthwash w/lidocaine  5 mL Oral  TID  . methocarbamol  750 mg Oral QID  . midodrine  5 mg Oral BID WC  . nystatin  5 mL Oral TID AC & HS  . protein supplement shake  11 oz Oral QID  . romiPLOStim  250 mcg Subcutaneous Q Thu  . sodium chloride flush  10-40 mL Intracatheter Q12H   Continuous Infusions: . sodium chloride 10 mL/hr at 07/25/18 2300  . fluconazole (DIFLUCAN) IV 400 mg (07/25/18 2300)     LOS: 49 days     Bonnielee Haff, MD [facility] Pager [phone]  If 7PM-7AM, please contact night-coverage [email or web address] Password [facility] 07/26/2018, 11:38 AM

## 2018-07-26 NOTE — Progress Notes (Signed)
@  0700 paged Attending On-call, Dr. Laurence SlateAroor regarding pt's critical platelet level of 27. No page received. Conveyed to Day RN in report who endorsed she would follow-up with Attending and Hematology.

## 2018-07-26 NOTE — Progress Notes (Signed)
Patient transported down to CT, to get the chest CT that was ordered.

## 2018-07-27 ENCOUNTER — Inpatient Hospital Stay (HOSPITAL_COMMUNITY)

## 2018-07-27 ENCOUNTER — Encounter (HOSPITAL_COMMUNITY): Payer: Self-pay | Admitting: Pulmonary Disease

## 2018-07-27 ENCOUNTER — Other Ambulatory Visit (HOSPITAL_COMMUNITY): Payer: Self-pay

## 2018-07-27 DIAGNOSIS — I7121 Aneurysm of the ascending aorta, without rupture: Secondary | ICD-10-CM | POA: Diagnosis present

## 2018-07-27 DIAGNOSIS — D75A Glucose-6-phosphate dehydrogenase (G6PD) deficiency without anemia: Secondary | ICD-10-CM | POA: Diagnosis present

## 2018-07-27 DIAGNOSIS — I2699 Other pulmonary embolism without acute cor pulmonale: Secondary | ICD-10-CM

## 2018-07-27 DIAGNOSIS — I712 Thoracic aortic aneurysm, without rupture: Secondary | ICD-10-CM | POA: Diagnosis present

## 2018-07-27 DIAGNOSIS — D550 Anemia due to glucose-6-phosphate dehydrogenase [G6PD] deficiency: Secondary | ICD-10-CM

## 2018-07-27 DIAGNOSIS — Z86718 Personal history of other venous thrombosis and embolism: Secondary | ICD-10-CM

## 2018-07-27 LAB — CBC
HEMATOCRIT: 31.2 % — AB (ref 39.0–52.0)
Hemoglobin: 9.9 g/dL — ABNORMAL LOW (ref 13.0–17.0)
MCH: 31 pg (ref 26.0–34.0)
MCHC: 31.7 g/dL (ref 30.0–36.0)
MCV: 97.8 fL (ref 80.0–100.0)
NRBC: 0.7 % — AB (ref 0.0–0.2)
Platelets: 57 10*3/uL — ABNORMAL LOW (ref 150–400)
RBC: 3.19 MIL/uL — AB (ref 4.22–5.81)
RDW: 16.7 % — ABNORMAL HIGH (ref 11.5–15.5)
WBC: 4.4 10*3/uL (ref 4.0–10.5)

## 2018-07-27 LAB — GLUCOSE, CAPILLARY
GLUCOSE-CAPILLARY: 330 mg/dL — AB (ref 70–99)
GLUCOSE-CAPILLARY: 263 mg/dL — AB (ref 70–99)
Glucose-Capillary: 282 mg/dL — ABNORMAL HIGH (ref 70–99)
Glucose-Capillary: 335 mg/dL — ABNORMAL HIGH (ref 70–99)
Glucose-Capillary: 286 mg/dL — ABNORMAL HIGH (ref 70–99)

## 2018-07-27 LAB — ECHOCARDIOGRAM COMPLETE
HEIGHTINCHES: 69 in
WEIGHTICAEL: 4500.91 [oz_av]

## 2018-07-27 LAB — COMPREHENSIVE METABOLIC PANEL
ALT: 22 U/L (ref 0–44)
AST: 28 U/L (ref 15–41)
Albumin: 3.2 g/dL — ABNORMAL LOW (ref 3.5–5.0)
Alkaline Phosphatase: 53 U/L (ref 38–126)
Anion gap: 11 (ref 5–15)
BUN: 51 mg/dL — AB (ref 6–20)
CHLORIDE: 103 mmol/L (ref 98–111)
CO2: 28 mmol/L (ref 22–32)
CREATININE: 0.65 mg/dL (ref 0.61–1.24)
Calcium: 9.4 mg/dL (ref 8.9–10.3)
GFR calc Af Amer: >60 mL/min (ref >60)
GFR calc non Af Amer: >60 mL/min (ref >60)
Glucose, Bld: 280 mg/dL — ABNORMAL HIGH (ref 70–99)
Potassium: 3.6 mmol/L (ref 3.5–5.1)
SODIUM: 142 mmol/L (ref 135–145)
Total Bilirubin: 0.7 mg/dL (ref 0.3–1.2)
Total Protein: 5.7 g/dL — ABNORMAL LOW (ref 6.5–8.1)

## 2018-07-27 LAB — PREPARE PLATELET PHERESIS: UNIT DIVISION: 0

## 2018-07-27 LAB — APTT
aPTT: 24 seconds (ref 24–36)
aPTT: 63 seconds — ABNORMAL HIGH (ref 24–36)

## 2018-07-27 LAB — BPAM PLATELET PHERESIS
BLOOD PRODUCT EXPIRATION DATE: 201911141135
ISSUE DATE / TIME: 201911131235
UNIT TYPE AND RH: 1700

## 2018-07-27 LAB — FIBRINOGEN: FIBRINOGEN: 221 mg/dL (ref 210–475)

## 2018-07-27 LAB — TYPE AND SCREEN
ABO/RH(D): O POS
Antibody Screen: NEGATIVE

## 2018-07-27 LAB — PROCALCITONIN: PROCALCITONIN: 0.44 ng/mL

## 2018-07-27 LAB — MAGNESIUM: Magnesium: 2 mg/dL (ref 1.7–2.4)

## 2018-07-27 LAB — ANTITHROMBIN III: ANTITHROMB III FUNC: 123 % — AB (ref 75–120)

## 2018-07-27 LAB — PROTIME-INR
INR: 1.25
Prothrombin Time: 15.6 seconds — ABNORMAL HIGH (ref 11.4–15.2)

## 2018-07-27 LAB — LACTATE DEHYDROGENASE: LDH: 403 U/L — ABNORMAL HIGH (ref 98–192)

## 2018-07-27 LAB — SAVE SMEAR(SSMR), FOR PROVIDER SLIDE REVIEW

## 2018-07-27 MED ORDER — POTASSIUM CHLORIDE CRYS ER 20 MEQ PO TBCR
40.0000 meq | EXTENDED_RELEASE_TABLET | Freq: Once | ORAL | Status: AC
  Administered 2018-07-27: 40 meq via ORAL
  Filled 2018-07-27: qty 2

## 2018-07-27 MED ORDER — DEXAMETHASONE SODIUM PHOSPHATE 10 MG/ML IJ SOLN
4.0000 mg | INTRAMUSCULAR | Status: DC
  Administered 2018-07-28 – 2018-08-02 (×6): 4 mg via INTRAVENOUS
  Filled 2018-07-27 (×6): qty 1

## 2018-07-27 MED ORDER — ROMIPLOSTIM 250 MCG ~~LOC~~ SOLR
250.0000 ug | SUBCUTANEOUS | Status: DC
  Administered 2018-07-27: 250 ug via SUBCUTANEOUS
  Filled 2018-07-27 (×2): qty 0.5

## 2018-07-27 MED ORDER — IPRATROPIUM-ALBUTEROL 0.5-2.5 (3) MG/3ML IN SOLN: 3.0000 mL | Freq: Two times a day (BID) | RESPIRATORY_TRACT | Status: DC

## 2018-07-27 MED ORDER — IPRATROPIUM-ALBUTEROL 0.5-2.5 (3) MG/3ML IN SOLN
3.0000 mL | Freq: Four times a day (QID) | RESPIRATORY_TRACT | Status: DC
  Administered 2018-07-27: 3 mL via RESPIRATORY_TRACT
  Filled 2018-07-27: qty 3

## 2018-07-27 MED ORDER — SODIUM CHLORIDE 0.9 % IV SOLN
0.2000 mg/kg/h | INTRAVENOUS | Status: DC
  Administered 2018-07-27 – 2018-07-30 (×5): 0.15 mg/kg/h via INTRAVENOUS
  Administered 2018-07-31: 0.17 mg/kg/h via INTRAVENOUS
  Administered 2018-08-01 – 2018-08-02 (×2): 0.19 mg/kg/h via INTRAVENOUS
  Administered 2018-08-02 – 2018-08-04 (×5): 0.22 mg/kg/h via INTRAVENOUS
  Filled 2018-07-27 (×16): qty 250

## 2018-07-27 NOTE — Progress Notes (Signed)
  Echocardiogram 2D Echocardiogram has been performed.  Delcie RochENNINGTON, Catilyn Boggus 07/27/2018, 4:06 PM

## 2018-07-27 NOTE — Progress Notes (Signed)
ANTICOAGULATION CONSULT NOTE -  Consult Note   Pharmacy Consult for Bivaliruidin Indication: pulmonary embolus and bilateral DVT  Allergies  Allergen Reactions  . Nsaids Other (See Comments)    G6PD DEFICIENCY  . Sulfa Antibiotics Other (See Comments)    G6PD DEFICIENCY  . Protonix [Pantoprazole Sodium] Diarrhea    Severe diarrhea, Upset stomach    Patient Measurements: Height: 5\' 9"  (175.3 cm) Weight: 281 lb 4.9 oz (127.6 kg) IBW/kg (Calculated) : 70.7 Bivalirudin Dosing Weight: 127.6 kg  Vital Signs: Temp: 97.8 F (36.6 C) (11/14 1900) Temp Source: Oral (11/14 1900) BP: 118/79 (11/14 1900) Pulse Rate: 81 (11/14 1900)  Labs: Recent Labs    07/25/18 0508 07/26/18 0600 07/27/18 0430 07/27/18 1906  HGB 10.1* 9.8* 9.9*  --   HCT 32.9* 31.9* 31.2*  --   PLT 36* 27* 57*  --   APTT 24 24 24  63*  LABPROT  --   --  15.6*  --   INR  --   --  1.25  --   CREATININE  --  0.74 0.65  --     Estimated Creatinine Clearance: 142.8 mL/min (by C-G formula based on SCr of 0.65 mg/dL).  Assessment:  52 yr old male with new PE per 11/13 CTA and bilateral DVT per duplex today, and thrombocytpenia. Has IVC filter.  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  Last MRI (11/10) did not show evidence of compressive hematoma.  Hx PE and DVT and was on Eliquis prior to admit.   Dr. Myna HidalgoEnnever and Dr. Wynetta Emeryram discussed risk/benefit and to begin Bivalirudin with low therapeutic goal.  Previously on Argatroban 10/22>10/23 then Bivalirudin 10/23>10/25. Noted switched due to Argatroban possibly contributing to bradycardia and hypotension. Discussed with Dr. Myna HidalgoEnnever, to use Bivalirudin. Prior aPTTs were 60 and 58 seconds on Bivalirudin at 0.15 mg/kg/hr.    2nd weekly Nplate dose given today.  No bleeding reported.  Initial aPTT within goal 63  Goal of Therapy:  aPTT 50-67 seconds (lower end of usual range 50-85 seconds) seconds Monitor platelets by  anticoagulation protocol: Yes   Plan:  Bivalirudin at 0.15 mg/kg/hr 2200 aPTT CBC daily. APTT at least daily. Monitor for any signs or symptoms of bleeding.  [name] [name], PharmD, BCPS, BCCCP Clinical Pharmacist [phone]  Please check AMION for all [facility] numbers  07/27/2018 8:03 PM

## 2018-07-27 NOTE — Progress Notes (Signed)
Spoke with Dr. Darnelle CatalanMagrinat @ [phone], unable to access Epic at this time. He will be coming in to see the patient. He also consulted CCM. Patient is stable at this time and will continue to monitor.

## 2018-07-27 NOTE — Progress Notes (Signed)
Patient ID: [name] [name], male   DOB: May 15, 1966, 52 y.o.   MRN: [id number] Events of the evening noted coronal continues to have soreness in his throat no better since I started the Diflucan the other day this is been going on since his last operation and may just be local irritation from the multiple intubation.  However may be worth consulting ENT and having him evaluate him   If it continues over the next few days.   CT angio shows new pulmonary embolus with evidence of right heart strain that is different and new from previous imaging.  Certainly this is very disappointing and reluctantly I agree this may necessitate anticoagulation.  I have contacted hematology who is aware of the study and awaiting final interpretation upon review of previous imaging.  I would hope that we could anticoagulate him with something has mild as we can get away with to treat the problem to minimize any affects of a bolus on stimulating bleeding and surgical site.  He is now about 2 and half weeks out from his last re-exploration of cervical wound with a postop MRI from last week and that showed no evidence of compressive hematoma.

## 2018-07-27 NOTE — Progress Notes (Signed)
[name] [name]   DOB:1966/02/22   OZ#:[id number]   NGE#:[id number]  Subjective:  I was called by nursing around 1 AM re results of patient's CT angio, which showed submassive acute PE with some evidence of Right heart strain. Evaluation complicated by Epic being down (read only). I requested critical care evaluation and visited the patient in 32N1. He denies any SOB, pleurisy or hemoptysis; has not noted any change in his breathing past 24h; keeps an occasional cough which was not apparent during visit; no family in room, but patient requested we contact his father by phone, which was done   Objective: middle-aged African American man examined in bed Vitals:   07/26/18 2300 07/27/18 0400  BP: (!) 123/92 (!) 136/95  Pulse: 73 77  Resp:  18  Temp: 97.8 F (36.6 C) 98.6 F (37 C)  SpO2:  96%    Body mass index is 43.95 kg/m.  Intake/Output Summary (Last 24 hours) at 07/27/2018 0615 Last data filed at 07/27/2018 0300 Gross per 24 hour  Intake 1491 ml  Output 2800 ml  Net -1309 ml   Note sats above are on RA   No cervical or supraclavicular adenopathy  Lungs no rhonchi or wheezes--auscultated anterolaterally  Heart regular rate and rhythm  Abdomen soft, +BS  Neuro: LUE 4-; RUE 3; moves R foot, cannot lift R leg    CBG (last 3)  Recent Labs    07/26/18 1155 07/26/18 1757 07/26/18 2140  GLUCAP 147* 218* 167*     Labs:  Lab Results  Component Value Date   WBC 4.2 07/26/2018   HGB 9.8 (L) 07/26/2018   HCT 31.9 (L) 07/26/2018   MCV 97.0 07/26/2018   PLT 27 (LL) 07/26/2018   NEUTROABS 2.6 07/26/2018    @LASTCHEMISTRY @  Urine Studies No results for input(s): UHGB, CRYS in the last 72 hours.  Invalid input(s): UACOL, UAPR, USPG, UPH, UTP, UGL, UKET, UBIL, UNIT, UROB, [place], UEPI, UWBC, [place], [place], [place], [place], [place]  Basic Metabolic Panel: Recent Labs  Lab 07/21/18 1840 07/22/18 0500 07/23/18 0455 07/24/18 0440 07/26/18 0600  NA 141 141 143 143 142  K 3.5 3.5 3.4*  3.8 3.2*  CL 101 100 100 102 102  CO2 32 35* 33* 35* 34*  GLUCOSE 313* 230* 205* 256* 164*  BUN 42* 46* 50* 52* 57*  CREATININE 0.79 0.70 0.83 0.75 0.74  CALCIUM 8.8* 9.2 9.0 9.4 9.3   GFR Estimated Creatinine Clearance: 147.3 mL/min (by C-G formula based on SCr of 0.74 mg/dL). Liver Function Tests: Recent Labs  Lab 07/21/18 1840 07/22/18 0500 07/23/18 0455 07/24/18 0440  AST 23 22 25 22   ALT 13 13 16 16   ALKPHOS 71 57 55 54  BILITOT 0.7 0.9 0.9 0.8  PROT 5.4* 5.6* 5.3* 5.7*  ALBUMIN 3.2* 3.2* 3.1* 3.0*   No results for input(s): LIPASE, AMYLASE in the last 168 hours. No results for input(s): AMMONIA in the last 168 hours. Coagulation profile Recent Labs  Lab 07/21/18 0500  INR 1.13    CBC: Recent Labs  Lab 07/22/18 0500 07/23/18 0455 07/24/18 0440 07/25/18 0508 07/26/18 0600  WBC 3.9* 2.7* 2.8* 3.5* 4.2  NEUTROABS 3.4 2.0 2.1 2.7 2.6  HGB 10.2* 10.2* 9.9* 10.1* 9.8*  HCT 33.4* 32.7* 32.6* 32.9* 31.9*  MCV 97.7 97.0 97.6 97.1 97.0  PLT 70* 54* 44* 36* 27*   Cardiac Enzymes: No results for input(s): CKTOTAL, CKMB, CKMBINDEX, TROPONINI in the last 168 hours. BNP: Invalid input(s): POCBNP CBG: Recent Labs  Lab 07/25/18 2129 07/26/18 0752 07/26/18 1155 07/26/18 1757 07/26/18 2140  GLUCAP 206* 126* 147* 218* 167*   D-Dimer No results for input(s): DDIMER in the last 72 hours. Hgb A1c No results for input(s): HGBA1C in the last 72 hours. Lipid Profile No results for input(s): CHOL, HDL, LDLCALC, TRIG, CHOLHDL, LDLDIRECT in the last 72 hours. Thyroid function studies No results for input(s): TSH, T4TOTAL, T3FREE, THYROIDAB in the last 72 hours.  Invalid input(s): FREET3 Anemia work up Recent Labs    07/26/18 1200  RETICCTPCT 1.3   Microbiology Recent Results (from the past 240 hour(s))  MRSA PCR Screening     Status: None   Collection Time: 07/21/18  3:41 PM  Result Value Ref Range Status   MRSA by PCR NEGATIVE NEGATIVE Final    Comment:         The GeneXpert MRSA Assay (FDA approved for NASAL specimens only), is one component of a comprehensive MRSA colonization surveillance program. It is not intended to diagnose MRSA infection nor to guide or monitor treatment for MRSA infections. Performed at [facility], [address], [place], [place] [address]       Studies:  Ct Angio Chest Pe W Or Wo Contrast  Addendum Date: 07/27/2018   ADDENDUM REPORT: 07/27/2018 00:59 ADDENDUM: Critical Value/emergent results were called by telephone at the time of interpretation on 07/27/2018 at 12:58 am to Dr. Clearence Ped, hospitalist , who verbally acknowledged these results. Electronically Signed   By: Awilda Metro M.D.   On: 07/27/2018 00:59   Addendum Date: 07/27/2018   ADDENDUM REPORT: 07/27/2018 00:13 ADDENDUM: Acute findings discussed with and reconfirmed by Dr.Buccini, GI on 07/26/2018 at 12:13 am. Electronically Signed   By: Awilda Metro M.D.   On: 07/27/2018 00:13   Result Date: 07/27/2018 CLINICAL DATA:  Follow up pulmonary embolism. EXAM: CT ANGIOGRAPHY CHEST WITH CONTRAST TECHNIQUE: Multidetector CT imaging of the chest was performed using the standard protocol during bolus administration of intravenous contrast. Multiplanar CT image reconstructions and MIPs were obtained to evaluate the vascular anatomy. CONTRAST:  ISOVUE-370 IOPAMIDOL (ISOVUE-370) INJECTION 76% COMPARISON:  Chest radiograph July 25, 2018 and CT chest July 15, 2018 FINDINGS: CARDIOVASCULAR: Adequate contrast opacification of the pulmonary artery's. Main pulmonary artery is not enlarged. Central bilateral main pulmonary artery nonocclusive pulmonary emboli casting into lobar, segmental and subsegmental branches, some of which are occlusive. RIGHT heart stain (RV/LV 1.4). Heart size is upper limits of normal. No pericardial effusion. MEDIASTINUM/NODES: No lymphadenopathy by CT size criteria. LEFT PICC distal tip and proximal superior vena cava.  LUNGS/PLEURA: Tracheobronchial tree is patent, no pneumothorax. Low inspiratory examination with elevated LEFT hemidiaphragm. Patchy LEFT lower lobe consolidation. Bibasilar bandlike atelectasis. No pleural effusion. 2 mm lingular subpleural pulmonary nodules unchanged. UPPER ABDOMEN: Non-acute. Moderate amount of retained large bowel stool. MUSCULOSKELETAL: Non-acute. 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 Review of the MIP images confirms the above findings. IMPRESSION: 1. Positive for acute PE with CT evidence of right heart strain (RV/LV Ratio = 1.4) consistent with at least submassive (intermediate risk) PE. The presence of right heart strain has been associated with an increased risk of morbidity and mortality. 2. LEFT lower lobe infarct versus pneumonia.  Bibasilar atelectasis. 3. Mild cardiomegaly. Electronically Signed: By: Awilda Metro M.D. On: 07/26/2018 23:34   Dg Chest Port 1 View  Result Date: 07/25/2018 CLINICAL DATA:  Cough EXAM: PORTABLE CHEST 1 VIEW COMPARISON:  July 20, 2018 FINDINGS: There  is no edema or consolidation. Heart is upper normal in size with pulmonary vascularity normal. No adenopathy. Central catheter tip is in the superior vena cava. No pneumothorax. No bone lesions evident. IMPRESSION: No edema or consolidation. Stable cardiac silhouette. Central catheter tip in superior vena cava. Electronically Signed   By: Bretta BangWilliam  Woodruff III M.D.   On: 07/25/2018 14:41    Assessment: 52 y.o. [place] man with severe thrombocytopenia, history of DVTs/PE, s/p IVC filter placement, s/p cervical laminectomy in July complicated by postoperative bleeding wit resultant cord compression symptoms, history of repeated bleeding on anticoagulation, with CT/angio as above  Plan:  I discussed the case with Dr Ardeth PerfectJeong critical care; consideration was given to low-dose heparin anticoagulation and possible transfer to ICU; however patient  is clinically stable as noted in exam above, is already in step-down with nursing staff well-acquainted with his situation, and has had repeated bleeding episodes with potentially catastrophic results (cord compression) when anticoagulation attempted. Accordingly it seemed best to continue observation in stepdown, address the coagulopathy (Dr Myna HidalgoEnnever consulting), and not proceed to anticoagulation at this time.  Discussed with the patient and patient's father (by phone)  Appreciate critical care's help with this complex patient!   Lowella DellGustav C , MD 07/27/2018  6:15 AM Medical Oncology and Hematology [facility] [address] [place], [place] [address] Tel. [phone]    Fax. [phone]

## 2018-07-27 NOTE — Consult Note (Signed)
Initial Pulmonary/Critical Care Consultation  Patient Name: [name] [name] MRN: [id number] DOB: September 20, 1965    ADMISSION DATE:  06/06/2018 CONSULTATION DATE:  07/27/2018  REFERRING MD:  Dr. Marikay Alar Mangrinat  REASON FOR CONSULTATION:  Pulmonary embolism   HISTORY OF PRESENT ILLNESS  This 52 y.o. 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 Marikay Alar Magrinat for recommendations on further evaluation and management of pulmonary embolism.  At the time of clinical interview, the patient is resting comfortably on room air with an SPO2 97-99%.  He denies pleuritic pain.  He denies any increase in shortness of breath.  Case was discussed with Dr. Darnelle Catalan at the bedside.  Review of the records suggests that the daytime hematologist/oncologist ordered a chest CTA to investigate persistent thrombocytopenia, perhaps suspecting progressively increasing clot burden.  The patient does have a known history of venous thromboembolism (DVT and PE).  He has an IVC filter in situ.  He has a complicated clinical course, which includes 9 surgical procedures in the past 4 months, the most recent one occurring on 07/10/2018 (evacuation of hematoma).  It appears the patient initially underwent anterior cervical corpectomy of C4 on 03/24/2018, which was complicated by postoperative venous thromboembolism with multiple DVTs and PEs.  He experienced hemorrhagic complications while undergoing secondary prophylaxis for venous thromboembolism (therapeutic anticoagulation), which precluded further anticoagulation.  He underwent IVC filter deployment..   This patient's current hospitalization started on 06/06/2018. He transferred to this facility from [facility] after an MRI of the C-spine raised concern for cord compression, cord edema, and concern for ischemia.  On 06/14/2018, he underwent posterior cervical decompression laminectomy (C3-C5),  removal of spinous processes, foraminotomies.  Approximately 1 week later (06/22/2018), the patient developed worsening left-sided paresthesias and weakness.  MRI revealed a hematoma, which was evacuated.  Again, a little more than a week later (07/01/2018), the patient had decreased left hand grip strength.  MRI at that time apparently revealed a large epidural hematoma.  Reinitiation of secondary prophylaxis for venous thrombus was not was attempted on 07/04/2018 with argatroban, which was treated by bradycardia arrhythmia.  Her Bactroban was changed to bivalirudin.  Again on 07/07/2018, the patient required emergent evacuation of a recurrent epidural hematoma which which was identified after recurrent episode of left arm weakness.  At that time, Dr. Wynetta Emery (neurosurgery) advised discontinuation of anticoagulation therapy.  On 10/28, patient had another episode of left arm weakness and numbness.  MRI confirmed recurrent hematoma, for which the patient made yet another trip to the operating theater.   REVIEW OF SYSTEMS Constitutional: No weight loss. No night sweats. No fever. No chills. No fatigue. HEENT: Hoarseness.  No odynophagia.  No headaches, dysphagia, otalgia, nasal congestion, PND CV:  No chest pain, orthopnea, PND, swelling in lower extremities, palpitations GI:  No abdominal pain, nausea, vomiting, diarrhea, change in bowel pattern, anorexia Resp: No pleuritic pain, increased dyspnea, rest dyspnea, cough, mucus, hemoptysis, wheezing  GU: no dysuria, change in color of urine, no urgency or frequency.  No flank pain. MS: Neck is in a hard collar.  Left arm weakness.  Bilateral hand paresthesias. Psych:  No change in mood or affect. No memory loss. Skin: no rash or lesions.   PAST MEDICAL/SURGICAL/SOCIAL/FAMILY HISTORIES   Past Medical History:  Diagnosis Date  . Degenerative disc disease, lumbar   . G6PD deficiency   . Gout   . History of pernicious anemia 1980   Patient says he  takes  OTC iron supplements QD  . Hypertension     Past Surgical History:  Procedure Laterality Date  . ANTERIOR CERVICAL CORPECTOMY N/A 03/24/2018   Procedure: CERVICAL FOUR ANTERIOR CERVICAL CORPECTOMY;  Surgeon: Donalee Citrin, MD;  Location: [facility] OR;  Service: Neurosurgery;  Laterality: N/A;  . ANTERIOR CERVICAL DECOMP/DISCECTOMY FUSION N/A 03/28/2018   Procedure: Reexploration of anterior cervical wound;  Surgeon: Donalee Citrin, MD;  Location: [facility] OR;  Service: Neurosurgery;  Laterality: N/A;  . HEMATOMA EVACUATION N/A 07/10/2018   Procedure: POSTERIOR CERVICAL HEMATOMA EVACUATION;  Surgeon: Jadene Pierini, MD;  Location: [facility] OR;  Service: Neurosurgery;  Laterality: N/A;  . IR IVC FILTER PLMT / S&I Lenise Arena GUID/MOD SED  03/28/2018  . IR RADIOLOGIST EVAL & MGMT  05/31/2018  . MENISCUS REPAIR Left   . POSTERIOR CERVICAL FUSION/FORAMINOTOMY N/A 06/14/2018   Procedure: POSTERIOR CERVICAL DECOMPRESSION/FUSION CERVICAL TWO- CERVICAL THREE CERVICAL THREE- CERVICAL FOUR, CERVICAL FOUR- CERVICAL FIVE;  Surgeon: Donalee Citrin, MD;  Location: [facility] OR;  Service: Neurosurgery;  Laterality: N/A;  POSTERIOR CERVICAL DECOMPRESSION/FUSION CERVICAL TWO- CERVICAL THREE CERVICAL THREE- CERVICAL FOUR, CERVICAL FOUR- CERVICAL FIVE  . POSTERIOR CERVICAL LAMINECTOMY N/A 06/22/2018   Procedure: POSTERIOR CERVICAL LAMINECTOMY FOR EVACUATION OF HEMATOMA;  Surgeon: Donalee Citrin, MD;  Location: [facility] OR;  Service: Neurosurgery;  Laterality: N/A;  . POSTERIOR CERVICAL LAMINECTOMY N/A 07/07/2018   Procedure: Evacuation of Hematoma POSTERIOR CERVICAL;  Surgeon: Donalee Citrin, MD;  Location: [facility] OR;  Service: Neurosurgery;  Laterality: N/A;  Evacuation of Hematoma POSTERIOR CERVICAL  . POSTERIOR CERVICAL LAMINECTOMY FOR EPIDURAL ABSCESS N/A 07/01/2018   Procedure: EVACUATION OF POSTERIOR CERVICAL HEMATOMA;  Surgeon: Jadene Pierini, MD;  Location: [facility] OR;  Service: Neurosurgery;  Laterality: N/A;  . TENDON REPAIR     tricept tendon    Social  History   Tobacco Use  . Smoking status: Never Smoker  . Smokeless tobacco: Never Used  Substance Use Topics  . 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  [place] has [place].  Family History  Family history unknown: Yes    Allergies  Allergen Reactions  . Nsaids Other (See Comments)    G6PD DEFICIENCY  . Sulfa Antibiotics Other (See Comments)    G6PD DEFICIENCY  . Protonix [Pantoprazole Sodium] Diarrhea    Severe diarrhea, Upset stomach    Prior to Admission medications   Medication Sig Start Date End Date Taking? Authorizing Provider  acetaminophen (TYLENOL) 325 MG tablet Take 2 tablets (650 mg total) by mouth every 4 (four) hours as needed for mild pain ((score 1 to 3) or temp > 100.5). 05/02/18  Yes Angiulli, Mcarthur Rossetti, PA-C  apixaban (ELIQUIS) 5 MG TABS tablet Take 1 tablet (5 mg total) by mouth 2 (two) times daily. 05/02/18  Yes Angiulli, Mcarthur Rossetti, PA-C  aspirin EC 81 MG EC tablet Take 1 tablet (81 mg total) by mouth daily. 05/02/18  Yes Angiulli, Mcarthur Rossetti, PA-C  baclofen (LIORESAL) 10 MG tablet Take 1 tablet (10 mg total) by mouth 2 (two) times daily as needed for muscle spasms. 05/02/18  Yes Angiulli, Mcarthur Rossetti, PA-C  colchicine 0.6 MG tablet Take 1 tablet (0.6 mg total) by mouth daily. 05/02/18  Yes Angiulli, Mcarthur Rossetti, PA-C  Cyanocobalamin 5000 MCG/ML LIQD Place 5,000 mcg under the tongue daily. Patient taking differently: Place 5,000 mcg under the tongue  as needed.  05/02/18  Yes Angiulli, Mcarthur Rossettianiel J, PA-C  hydrochlorothiazide (MICROZIDE) 12.5 MG capsule Take 1 capsule (12.5 mg total) by mouth daily. 05/02/18  Yes Angiulli, Mcarthur Rossettianiel J, PA-C  oxyCODONE 10 MG TABS Take 1 tablet (10 mg total) by mouth every 3 (three) hours as needed for severe pain ((score 7 to 10)). 05/02/18  Yes Angiulli, Mcarthur Rossettianiel J, PA-C  saccharomyces boulardii (FLORASTOR) 250 MG capsule Take 1  capsule (250 mg total) by mouth 2 (two) times daily. Patient not taking: Reported on 06/08/2018 05/02/18   Charlton AmorAngiulli, Daniel J, PA-C    Current Facility-Administered Medications  Medication Dose Route Frequency Provider Last Rate Last Dose  . 0.9 %  sodium chloride infusion   Intravenous PRN Osvaldo ShipperKrishnan, Gokul, MD 10 mL/hr at 07/25/18 2300    . acetaminophen (TYLENOL) tablet 650 mg  650 mg Oral Q4H PRN Edsel PetrinMikhail, Maryann, DO   650 mg at 06/26/18 2204   Or  . acetaminophen (TYLENOL) suppository 650 mg  650 mg Rectal Q4H PRN Edsel PetrinMikhail, Maryann, DO      . alum & mag hydroxide-simeth (MAALOX/MYLANTA) 200-200-20 MG/5ML suspension 30 mL  30 mL Oral Q6H PRN Edsel PetrinMikhail, Maryann, DO   30 mL at 06/26/18 2203  . benzonatate (TESSALON) capsule 100 mg  100 mg Oral TID PRN Edsel PetrinMikhail, Maryann, DO   100 mg at 07/26/18 1308  . bisacodyl (DULCOLAX) EC tablet 5 mg  5 mg Oral Daily PRN Edsel PetrinMikhail, Maryann, DO   5 mg at 07/02/18 2120  . clonazePAM (KLONOPIN) tablet 0.5 mg  0.5 mg Oral BID Osvaldo ShipperKrishnan, Gokul, MD   0.5 mg at 07/26/18 2149  . dexamethasone (DECADRON) injection 4 mg  4 mg Intravenous Q12H Donalee Citrinram, Gary, MD   4 mg at 07/26/18 2158  . fluconazole (DIFLUCAN) IVPB 400 mg  400 mg Intravenous Q24H Kathlen ModyAkula, Vijaya, MD 100 mL/hr at 07/26/18 2202 400 mg at 07/26/18 2202  . furosemide (LASIX) injection 40 mg  40 mg Intravenous Daily Kathlen ModyAkula, Vijaya, MD   40 mg at 07/26/18 0950  . gabapentin (NEURONTIN) capsule 300 mg  300 mg Oral TID Edsel PetrinMikhail, Maryann, DO   300 mg at 07/26/18 2148  . Gerhardt's butt cream   Topical BID Donalee Citrinram, Gary, MD      . HYDROcodone-homatropine [facility](HYCODAN) 5-1.5 MG/5ML syrup 5 mL  5 mL Oral Q4H PRN Edsel PetrinMikhail, Maryann, DO   5 mL at 07/14/18 1743  . Influenza vac split quadrivalent PF (FLUARIX) injection 0.5 mL  0.5 mL Intramuscular Tomorrow-1000 Mikhail, Maryann, DO      . insulin aspart (novoLOG) injection 0-15 Units  0-15 Units Subcutaneous TID WC Kathlen ModyAkula, Vijaya, MD   5 Units at 07/26/18 1802  . insulin aspart (novoLOG)  injection 0-5 Units  0-5 Units Subcutaneous QHS Edsel PetrinMikhail, Maryann, DO   2 Units at 07/25/18 2143  . insulin aspart (novoLOG) injection 5 Units  5 Units Subcutaneous TID WC Kathlen ModyAkula, Vijaya, MD   5 Units at 07/26/18 1802  . insulin glargine (LANTUS) injection 45 Units  45 Units Subcutaneous QHS Kathlen ModyAkula, Vijaya, MD   45 Units at 07/26/18 2152  . iopamidol (ISOVUE-370) 76 % injection           . ipratropium-albuterol (DUONEB) 0.5-2.5 (3) MG/3ML nebulizer solution 3 mL  3 mL Nebulization Q4H PRN Kathlen ModyAkula, Vijaya, MD      . magic mouthwash w/lidocaine  5 mL Oral TID Meyran, Tiana LoftKimberly Hannah, NP   5 mL at 07/26/18 2153  . methocarbamol (ROBAXIN) tablet 750 mg  750 mg  Oral QID Edsel Petrin, DO   750 mg at 07/26/18 2155  . midodrine (PROAMATINE) tablet 5 mg  5 mg Oral BID WC Osvaldo Shipper, MD   5 mg at 07/26/18 1621  . morphine 2 MG/ML injection 2 mg  2 mg Intravenous Q4H PRN Edsel Petrin, DO   2 mg at 07/21/18 2245  . nystatin (MYCOSTATIN) 100000 UNIT/ML suspension 500,000 Units  5 mL Oral TID AC & HS Edsel Petrin, DO   500,000 Units at 07/26/18 2154  . ondansetron (ZOFRAN) tablet 4 mg  4 mg Oral Q6H PRN Edsel Petrin, DO       Or  . ondansetron [facility]) injection 4 mg  4 mg Intravenous Q6H PRN Edsel Petrin, DO      . oxyCODONE (Oxy IR/ROXICODONE) immediate release tablet 15 mg  15 mg Oral Q3H PRN Edsel Petrin, DO   15 mg at 07/23/18 0701  . protein supplement (PREMIER PROTEIN) liquid - approved for s/p bariatric surgery  11 oz Oral QID Kathlen Mody, MD   11 oz at 07/26/18 2155  . romiPLOStim (NPLATE) injection 250 mcg  250 mcg Subcutaneous Q Jinny Blossom, MD   250 mcg at 07/20/18 1100  . sodium chloride flush (NS) 0.9 % injection 10-40 mL  10-40 mL Intracatheter Q12H Regalado, Belkys A, MD   10 mL at 07/26/18 2156  . sodium chloride flush (NS) 0.9 % injection 10-40 mL  10-40 mL Intracatheter PRN Regalado, Belkys A, MD      . sodium chloride flush (NS) 0.9 % injection 3 mL  3 mL  Intravenous PRN Edsel Petrin, DO   3 mL at 07/20/18 1010    VITAL SIGNS: BP (!) 123/92 (BP Location: Right Arm)   Pulse 73   Temp 97.8 F (36.6 C) (Oral)   Resp (!) 23   Ht 5\' 9"  (1.753 m)   Wt 135 kg   SpO2 99%   BMI 43.95 kg/m   INTAKE / OUTPUT: I/O last 3 completed shifts: In: 2391 [P.O.:840; I.V.:260; Blood:391; IV Piggyback:900] Out: 2900 [Urine:2900]  PHYSICAL EXAMINATION: GENERAL: alert, oriented to person, place, and time. Pleasant. Well-developed. Cooperative. No acute distress. HEAD: normocephalic, atraumatic EYE: PERRLA, EOM intact, no scleral icterus, no pallor. NOSE: nares are patent. No exudate. THROAT/ORAL CAVITY: Normal dentition. No oral thrush. No exudate. Mucous membranes are moist. No tonsillar enlargement. Mallampati class IV (only hard palate visible) airway. NECK: Neck is in a hard collar. CHEST/LUNG: symmetric in development and expansion. Good air entry. no crackles. No wheezes. HEART: Regular S1 and S2 without murmur, rub or gallop. ABDOMEN: soft, nontender, nondistended. Normoactive bowel sounds. No rebound. No guarding. No hepatosplenomegaly. EXTREMITIES: Edema: Trace. No cyanosis.  No clubbing. 2+ DP pulses LYMPHATIC: no cervical/axiallary/inguinal lymph nodes appreciated MUSCULOSKELETAL: No point tenderness.  No bulk atrophy. Joints: no joint tenderness, deformity or swelling. SKIN: stage II ischial decubitus. NEUROLOGIC: Spontaneous respirations intact. Cranial nerves II-XII are grossly symmetric and physiologic. No sensory deficit. Motor: 3/5 @ RUE, 2/5 @ LUE, 4/5 @ RLL,  2/5 @ LLL.  DTR: 2+ @ R biceps, 1+ @ L biceps, 2+ @ R patellar,  1+ @ L patellar. No cerebellar signs. Gait was not assessed.   LABS:  BASIC METABOLIC PROFILE Recent Labs  Lab 07/23/18 0455 07/24/18 0440 07/26/18 0600  NA 143 143 142  K 3.4* 3.8 3.2*  CL 100 102 102  CO2 33* 35* 34*  BUN 50* 52* 57*  CREATININE 0.83 0.75 0.74  GLUCOSE 205* 256*  164*  CALCIUM 9.0  9.4 9.3    Glucose Recent Labs  Lab 07/25/18 1737 07/25/18 2129 07/26/18 0752 07/26/18 1155 07/26/18 1757 07/26/18 2140  GLUCAP 141* 206* 126* 147* 218* 167*    Liver Enzymes Recent Labs  Lab 07/22/18 0500 07/23/18 0455 07/24/18 0440  AST 22 25 22   ALT 13 16 16   ALKPHOS 57 55 54  BILITOT 0.9 0.9 0.8  ALBUMIN 3.2* 3.1* 3.0*    CBC Recent Labs  Lab 07/24/18 0440 07/25/18 0508 07/26/18 0600  WBC 2.8* 3.5* 4.2  HGB 9.9* 10.1* 9.8*  HCT 32.6* 32.9* 31.9*  PLT 44* 36* 27*    COAGULATION STUDIES Recent Labs  Lab 07/21/18 0500  07/24/18 0440 07/25/18 0508 07/26/18 0600  APTT 22*   < > 24 24 24   INR 1.13  --   --   --   --    < > = values in this interval not displayed.    IMAGING: Ct Angio Chest Pe W Or Wo Contrast  Addendum Date: 07/27/2018   ADDENDUM REPORT: 07/27/2018 00:59 ADDENDUM: Critical Value/emergent results were called by telephone at the time of interpretation on 07/27/2018 at 12:58 am to Dr. Clearence Ped, hospitalist , who verbally acknowledged these results. Electronically Signed   By: Awilda Metro M.D.   On: 07/27/2018 00:59   Addendum Date: 07/27/2018   ADDENDUM REPORT: 07/27/2018 00:13 ADDENDUM: Acute findings discussed with and reconfirmed by Dr.Buccini, GI on 07/26/2018 at 12:13 am. Electronically Signed   By: Awilda Metro M.D.   On: 07/27/2018 00:13   Result Date: 07/27/2018 CLINICAL DATA:  Follow up pulmonary embolism. EXAM: CT ANGIOGRAPHY CHEST WITH CONTRAST TECHNIQUE: Multidetector CT imaging of the chest was performed using the standard protocol during bolus administration of intravenous contrast. Multiplanar CT image reconstructions and MIPs were obtained to evaluate the vascular anatomy. CONTRAST:  ISOVUE-370 IOPAMIDOL (ISOVUE-370) INJECTION 76% COMPARISON:  Chest radiograph July 25, 2018 and CT chest July 15, 2018 FINDINGS: CARDIOVASCULAR: Adequate contrast opacification of the pulmonary artery's. Main pulmonary artery  is not enlarged. Central bilateral main pulmonary artery nonocclusive pulmonary emboli casting into lobar, segmental and subsegmental branches, some of which are occlusive. RIGHT heart stain (RV/LV 1.4). Heart size is upper limits of normal. No pericardial effusion. MEDIASTINUM/NODES: No lymphadenopathy by CT size criteria. LEFT PICC distal tip and proximal superior vena cava. LUNGS/PLEURA: Tracheobronchial tree is patent, no pneumothorax. Low inspiratory examination with elevated LEFT hemidiaphragm. Patchy LEFT lower lobe consolidation. Bibasilar bandlike atelectasis. No pleural effusion. 2 mm lingular subpleural pulmonary nodules unchanged. UPPER ABDOMEN: Non-acute. Moderate amount of retained large bowel stool. MUSCULOSKELETAL: Non-acute. 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 Review of the MIP images confirms the above findings. IMPRESSION: 1. Positive for acute PE with CT evidence of right heart strain (RV/LV Ratio = 1.4) consistent with at least submassive (intermediate risk) PE. The presence of right heart strain has been associated with an increased risk of morbidity and mortality. 2. LEFT lower lobe infarct versus pneumonia.  Bibasilar atelectasis. 3. Mild cardiomegaly. Electronically Signed: By: Awilda Metro M.D. On: 07/26/2018 23:34    ASSESSMENT / PLAN: Principal Problem:   Recurrent pulmonary embolism (HCC) Active Problems:   Myelopathy (HCC)   Thrombocytopenia (HCC)   Cord compression (HCC)   Spondylosis, cervical, with myelopathy   G6PD deficiency   Pressure injury of skin   Fever   Labile blood pressure   Orthostatic hypotension   G6PD deficiency anemia (HCC)  Acute blood loss anemia   Benign essential HTN   Tachycardia-bradycardia syndrome (HCC)   Adjustment disorder with mixed anxiety and depressed mood   Ascending aortic aneurysm (HCC)   PULMONARY  Pulmonary embolism, recurrent: similar to that seen on previous  chest CT angiogram on 03/27/2018, which apparently improved on repeat imaging on 07/15/2018  Pulmonary nodules, lingula, 2 mm  Pulmonary consolidation, left lower lobe: Infarct vs pneumonia The patient has already undergone IVC filter deployment.  With his recent hemorrhagic complications on therapeutic anticoagulation and recent neurosurgical procedures, he has compelling contraindications to thrombolytic therapy. Additionally, neither anticoagulation nor thrombolytic therapy should be pursued without input from Dr. Wynetta Emery. Check hypercoagulability profile. Check procalcitonin. 12-lead EKG and 2D echocardiographic interrogation. Although the patient demonstrates no evidence of hypoxia or respiratory distress on room air at this time, CTA did suggest possible RV strain. Supplemental oxygen as needed to maintain SPO2 greater than 93%.  In light of the patient's recent hemorrhagic complications while on anticoagulation, I do not believe the patient should start on anticoagulant therapy at this time.    CARDIOVASCULAR  Ascending aortic aneurysm, 4 cm Radiographic surveillance is warranted  HEMATOLOGIC  G6PD deficiency  Normocytic anemia  Thrombocytopenia Evaluation per Hem/Onc  NEUROLOGIC  Cervical myelopathy and quadriparesis  RENAL  No acute issues  GASTROINTESTINAL  No acute issues   INFECTIOUS  No acute issues  ENDOCRINE  No acute issues   Marcelle Smiling, MD Board Certified by the ABIM, Pulmonary Diseases & Critical Care Medicine  [facility] Pager: [phone]  07/27/2018, 3:40 AM

## 2018-07-27 NOTE — Progress Notes (Signed)
PT Cancellation Note  Patient Details Name: [name] [name] MRN: [id number] DOB: [birth year]   Cancelled Treatment:    Reason Eval/Treat Not Completed: Other (comment).  Pt did not feel up for OOB to chair today.  [name] [name] reported that [name] [name] was up in the chair too long last time we lifted him.  [name] [name] is also worried about the PE in his chest.  [name] [name] is agreeable for PT to check back on him tomorrow.  Thanks,  Rollene Rotundaebecca B. Vivianna Piccini, PT, DPT  Acute Rehabilitation [phone] pager [phone]) [phone] office     Lurena JoinerRebecca B Peyson Delao 07/27/2018, 10:22 AM

## 2018-07-27 NOTE — Progress Notes (Addendum)
PROGRESS NOTE    [name] [name]  FYB:[id number] DOB: 02-09-1966 DOA: 06/06/2018 PCP: Helane Rima, MD    Brief Narrative: Brief Narrative:  52 year old man with past medical history relevant for gout, hypertension anterior cervical corpectomy of C4 on 03/24/2018 with comp gated postoperative course including development of multiple PEs and DVTs started on anticoagulation and subsequently developed hematoma requiring reexploration on 03/28/2018 status post placement of IVC filter who was discharged to skilled nursing facility and had a complicated course including progressive lower extremity weakness, intermittent fevers of unclear etiology and hospitalizations for "pneumonia". Patient was admitted to this hospital is a transfer from [facility] with MRI of C-spine that showed postoperative changes and cord compression with cord edema and concern for ischemia. Patient is status post posterior cervical decompression laminectomy of C3, C4, C5, removal of spinous processes, foraminotomies on 06/14/2018. On 06/22/2018 patient began to develop worsening left sided paresthesias and weakness and a stat MRI showed evidence of postoperative hematoma. Patient was taken to the OR with evacuation of hematoma. Additionally his postoperative course has been complicated by recurrent fevers,bilateral lower extremity DVTs on ultrasound,migratory arthralgias/arthritis that are thought to be rheumatologic in nature as well as significant orthostasis limiting the amount he can work with physical therapy. His arthralgia has resolved.   Noted to have decreased left hand grip on 10/19, MRI showed large epidural hematoma, patient s/p clot evacuation.  He was place back on anticoagulation argatroban on 10-22. He was notice to be more bradycardic with HR in the 40 on 10-23. Cardiology was consulted, and argatroban was transition to bivalirrubin to avoid bradycardia. His HR improved after argatroban was discontinue.  -HIT panel 0.486 (mildly elevated)- SRA (serotonin release assay) negative. Dr Marin Olp has been helping with patient care.   Patient developed weakness of left arm on 10-25. He was take emergently to OR for recurrence of epidural hematoma on 10-25. Anticoagulation was discontinue since 10-25. Dr Saintclair Halsted is recommending for patient to remain off anticoagulation for alt leas 1 week, and if anticoagulation is consider, that will need to be discuss with DR Saintclair Halsted.  Patient the night of 10-28 develops left arm weakness, numbness. Stat MRI was ordered, which confirm recurrent hematoma. Patient was taken to OR for hematoma evacuation 10-28. He has been off anticoagulation since 10-25. For his thrombocytopenia, a Bone marrow biopsy was done. For LE DVT, he has IVC filter. He cannot be on anticoagulation due to recurrent bleeding.  Patient has received multiple platelet transfusions.  Patient underwent CT scan of his chest on 11/13 which showed new pulmonary embolism.    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  -MRI done on 11/10 shows improvement in the fluid collection.  Other findings as per neurosurgery. Neurosurgery continues to follow.  It does not appear like it is any further plans for surgeries at this time.  Dexamethasone dose is being slowly tapered down.  Continues to have weakness in the left upper and lower extremities.  Focus needs to be on rehabilitation at this point in time.  Acute pulmonary embolism/DVT in setting of spine surgery -Status post IVC filter placement on March 28, 2018 -Patient was on apixaban and changed over to IV heparin.  However these agents had to be discontinued due to recurrent episodes of hematoma at the surgical site.  -Vascular DVT US 06/26/2018 showed bilateral lower extremity DVT likely acute -Case was discussed with  vascular surgery, Dr. Trula Slade.  Also discussed with interventional radiology.  Unfortunately patient does not have any good options vis--vis either thrombolysis or mechanical thrombectomy. -Bivalirudin discontinue by neurosurgery. Serotonin release assay negative.  Remains off of anticoagulation due to bleeding episodes and severe thrombocytopenia CT scan done overnight shows new pulmonary embolism with evidence for right heart strain.  Clinically however patient remains stable.  He denies any symptoms.  His vital signs are all stable.  Discussed with Dr. Marin Olp.  He does feel like patient should be on some kind of anticoagulation although his low platelet counts and his propensity to bleed a major concern.  Normocytic anemia Patient did get 1 unit of PRBC earlier during this hospitalization.  Hemoglobin has been stable.  No evidence of overt bleeding.  Thrombocytopenia/coagulopathy -Cardiolipin antibody negative -Lupus anticoagulant, homocysteine level, factor VIII within normal limits -Protein S activity 60 (low) -Protein C total >200; Activity 199 (high)  -HIT panel 0.486 (mildly elevated)- SRA (serotonin  release assay) negative -immature platelet fraction WNL -Was given 2u platelets on 10/19 in the OR. And 2 units 10-25 for OR. Platelet transfusion 10/29 Patient transfused platelets and given vitamin K on 10/30. Patient underwent bone marrow biopsy on 10/30.  Pathology report suggests nonspecific findings.   Hematology suspects that some of his coagulopathy and platelet low platelet counts are likely due to medications.  But no clear culprit has been identified.   Platelet counts noted to be low on 11/13.  Patient was transfused platelets.  Hematology follows closely.  Nplate also has been given.  IVIG is being considered.  Again no clear-cut etiology has been found so far.  Orthostatic hypotension with near syncope Thought to be related to hypovolemia and autonomic dysfunction.  Echocardiogram July 2019 showed a normal systolic function, grade 2 diastolic dysfunction.  No significant valve abnormalities noted. Patient was placed on midodrine.  Midodrine dose was decreased due to bradycardia.  Abdominal binder.  Seems to be stable.  Sinus bradycardia Patient initially developed bradycardia on 10/23.  He was asymptomatic.  TSH was 1.24.  Cardiology was consulted at that time.  There was no indication for pacemaker.  His heart rate stabilized and improved.  He again became bradycardic on 11/1.  EKG shows sinus bradycardia.  Telemetry did not show any pauses.  Dose of midodrine was decreased as midodrine can sometimes cause bradycardia.  Autonomic dysfunction is another possible etiology.  Avoid AV nodal blocking agents.  Heart rate is stable.  Polyarthralgia/persistent fever -At the time of admission patient had symptoms in the shoulders and ankles, along with a history of gout and pain in his knees.  He underwent arthrocentesis however no evidence of infection was  found. -He was noted to have elevated inflammatory markers including CCP, CRP.  However ANCA, aldolase, ANA were negative. -Previous  rounding physicians have discussed with Dr. Kathlene November, rheumatologist, he will follow-up with the patient as an outpatient. -Patient was placed on prednisone however currently on dexamethasone (neurosurgery) No further episodes of fever.  Patient was on doxycycline for unclear reason and this was discontinued on 10/31.    Upper and lower extremity edema Has improved significantly with diuresis.  Continue with Lasix once a day for now.  Check weights.  Gout Stable.  Continue Uloric  Acute renal failure/hypokalemia Renal function is back to baseline.  Remains stable.  Monitor while he is getting diuretics.  Essential hypertension  Amlodipine discontinue due to orthostatic hypotension and edema.  Blood pressure remains stable.  Hyperglycemia secondary to steroid CBGs have improved.  Steroid is being tapered down.  May need to decrease the dose of Lantus.  HbA1c 5.8.  Adjustment disorder with depression and anxiety Patient noted to have flat affect and depressed mood and so psychiatry was consulted.  They recommended changing Xanax to Klonopin on a scheduled basis.  They also recommended sertraline.  He was changed over from alprazolam to Klonopin however did not start him on sertraline due to his hematological issues.    Abnormal CT findings CT scan raised concern for pneumonia but patient respiratory symptoms.  Patient did not have any respiratory symptoms.  Gallstones were noted as well.    Stage II pressure ulcer on the right ischial tuberosity Wound care nurse is following.  Suspected Oropharyngeal candidiasis Patient noted to be on Diflucan.  Continue for now.   DVT prophylaxis: TED stockings Code Status: Full code Family Communication: Discussed with the patient Disposition Plan: Patient remains hemodynamically stable.  New pulmonary embolism noted on CT scan.  Patient requesting second opinion from [place].  We will attempt to discuss with hematology over there.      Consultants:     Neurosurgery  Hematology  Cardiology    Procedures: On 10/2 1. posterior cervical decompressive laminectomy at C3, C4, C5 with removal of the spinous process and complete laminectomy and foraminotomies of the C3, C4, C5 nerve roots. 2. Posterior cervical fusion with lateral mass screws at C3-C4-C5 and pars screws at C2 lies in the globus ellipseLateral mass screw system 3. Posterior lateral arthrodesis C2-C5 utilizing locally harvested autograft mixed with vivigen  06/22/2018: Procedure reexploration posterior cervical wound for evacuation of epidural hematoma  06/26/2018 DVT ultrasound:Right: Findings consistent with acute deep vein thrombosis involving the right common femoral vein, right femoral vein, right popliteal vein, right posterior tibial vein, and right peroneal vein. Thrombosis extends proximally into external iliac vein.  Proximal iliac vein not visualized. Left: Findings consistent with acute deep vein thrombosis involving the left common femoral vein, left femoral vein, left popliteal vein, left posterior tibial vein, and left peroneal vein. Findings consistent with acute superficial vein thrombosis  involving the left great saphenous vein. Thrombosis extends proximally into external iliac vein. Proximal iliac vein not visualized.  07/01/18 Clot evacuation   RUE doppler 07/02/18    Antimicrobials:  Completed course of Ancef  Subjective: Patient slightly upset about the new blood clots.  Denies any chest pain shortness of breath.  No nausea vomiting.  Still with weakness on the left side.  Objective: Vitals:   07/26/18 2300 07/27/18 0400 07/27/18 0724 07/27/18 0815  BP: (!) 123/92 (!) 136/95 115/86   Pulse: 73 77 73   Resp:  18 (!)  22   Temp: 97.8 F (36.6 C) 98.6 F (37 C) 97.7 F (36.5 C)   TempSrc: Oral Oral Oral   SpO2:  96% 94% 98%  Weight:      Height:        Intake/Output Summary (Last 24 hours) at 07/27/2018 1148 Last data filed at  07/27/2018 1000 Gross per 24 hour  Intake 671 ml  Output 2800 ml  Net -2129 ml   Filed Weights   06/22/18 1641 07/07/18 1000 07/18/18 0600  Weight: 123.8 kg 123.8 kg 135 kg    Examination:  General exam: Awake alert.  In no distress Respiratory system: Normal effort at rest.  Clear to auscultation bilaterally Cardiovascular system: S1-S2 is normal regular.  No S3-S4.  No rubs murmurs or bruit Gastrointestinal system: Abdomen soft.  Nontender nondistended Improving edema bilateral upper and lower extremities    Data Reviewed: I have personally reviewed following labs and imaging studies  CBC: Recent Labs  Lab 07/22/18 0500 07/23/18 0455 07/24/18 0440 07/25/18 0508 07/26/18 0600 07/27/18 0430  WBC 3.9* 2.7* 2.8* 3.5* 4.2 4.4  NEUTROABS 3.4 2.0 2.1 2.7 2.6  --   HGB 10.2* 10.2* 9.9* 10.1* 9.8* 9.9*  HCT 33.4* 32.7* 32.6* 32.9* 31.9* 31.2*  MCV 97.7 97.0 97.6 97.1 97.0 97.8  PLT 70* 54* 44* 36* 27* 57*   Basic Metabolic Panel: Recent Labs  Lab 07/22/18 0500 07/23/18 0455 07/24/18 0440 07/26/18 0600 07/27/18 0430  NA 141 143 143 142 142  K 3.5 3.4* 3.8 3.2* 3.6  CL 100 100 102 102 103  CO2 35* 33* 35* 34* 28  GLUCOSE 230* 205* 256* 164* 280*  BUN 46* 50* 52* 57* 51*  CREATININE 0.70 0.83 0.75 0.74 0.65  CALCIUM 9.2 9.0 9.4 9.3 9.4  MG  --   --   --   --  2.0   GFR: Estimated Creatinine Clearance: 147.3 mL/min (by C-G formula based on SCr of 0.65 mg/dL).  Coagulation Profile: Recent Labs  Lab 07/21/18 0500 07/27/18 0430  INR 1.13 1.25   CBG: Recent Labs  Lab 07/26/18 0752 07/26/18 1155 07/26/18 1757 07/26/18 2140 07/27/18 0729  GLUCAP 126* 147* 218* 167* 282*      Radiology Studies: Ct Angio Chest Pe W Or Wo Contrast  Addendum Date: 07/27/2018   ADDENDUM REPORT: 07/27/2018 10:43 ADDENDUM: The referring provider has asked that this exam be compared to the study from 03/27/2018. I have reviewed only the pulmonary arteries on the current  study. The exam from 03/27/2018 demonstrated acute pulmonary embolus that had resolved by the study of 07/15/2018. The pulmonary embolic disease on the current study (07/26/2018) is therefore new since 07/15/2018 and is present in pulmonary arteries that were free of embolus on the 03/27/2018 exam (compare right main and interlobar pulmonary arteries on the 07/26/2018 and 03/27/2018 exams), again confirming this is new embolic disease since 19/62/2297. Electronically Signed   By: Misty Stanley M.D.   On: 07/27/2018 10:43   Addendum Date: 07/27/2018   ADDENDUM REPORT: 07/27/2018 00:59 ADDENDUM: Critical Value/emergent results were called by telephone at the time of interpretation on 07/27/2018 at 12:58 am to Dr. Hilbert Bible, hospitalist , who verbally acknowledged these results. Electronically Signed   By: Elon Alas M.D.   On: 07/27/2018 00:59   Addendum Date: 07/27/2018   ADDENDUM REPORT: 07/27/2018 00:13 ADDENDUM: Acute findings discussed with and reconfirmed by Dr.Buccini, GI on 07/26/2018 at 12:13 am. Electronically Signed   By: Thana Farr.D.  On: 07/27/2018 00:13   Result Date: 07/27/2018 CLINICAL DATA:  Follow up pulmonary embolism. EXAM: CT ANGIOGRAPHY CHEST WITH CONTRAST TECHNIQUE: Multidetector CT imaging of the chest was performed using the standard protocol during bolus administration of intravenous contrast. Multiplanar CT image reconstructions and MIPs were obtained to evaluate the vascular anatomy. CONTRAST:  147m ISOVUE-370 IOPAMIDOL (ISOVUE-370) INJECTION 76% COMPARISON:  Chest radiograph July 25, 2018 and CT chest July 15, 2018 FINDINGS: CARDIOVASCULAR: Adequate contrast opacification of the pulmonary artery's. Main pulmonary artery is not enlarged. Central bilateral main pulmonary artery nonocclusive pulmonary emboli casting into lobar, segmental and subsegmental branches, some of which are occlusive. RIGHT heart stain (RV/LV 1.4). Heart size is upper limits of normal.  No pericardial effusion. MEDIASTINUM/NODES: No lymphadenopathy by CT size criteria. LEFT PICC distal tip and proximal superior vena cava. LUNGS/PLEURA: Tracheobronchial tree is patent, no pneumothorax. Low inspiratory examination with elevated LEFT hemidiaphragm. Patchy LEFT lower lobe consolidation. Bibasilar bandlike atelectasis. No pleural effusion. 2 mm lingular subpleural pulmonary nodules unchanged. UPPER ABDOMEN: Non-acute. Moderate amount of retained large bowel stool. MUSCULOSKELETAL: Non-acute. 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 Review of the MIP images confirms the above findings. IMPRESSION: 1. Positive for acute PE with CT evidence of right heart strain (RV/LV Ratio = 1.4) consistent with at least submassive (intermediate risk) PE. The presence of right heart strain has been associated with an increased risk of morbidity and mortality. 2. LEFT lower lobe infarct versus pneumonia.  Bibasilar atelectasis. 3. Mild cardiomegaly. Electronically Signed: By: CElon AlasM.D. On: 07/26/2018 23:34   Dg Chest Port 1 View  Result Date: 07/25/2018 CLINICAL DATA:  Cough EXAM: PORTABLE CHEST 1 VIEW COMPARISON:  July 20, 2018 FINDINGS: There is no edema or consolidation. Heart is upper normal in size with pulmonary vascularity normal. No adenopathy. Central catheter tip is in the superior vena cava. No pneumothorax. No bone lesions evident. IMPRESSION: No edema or consolidation. Stable cardiac silhouette. Central catheter tip in superior vena cava. Electronically Signed   By: WLowella GripIII M.D.   On: 07/25/2018 14:41        Scheduled Meds: . clonazePAM  0.5 mg Oral BID  . dexamethasone  4 mg Intravenous Q12H  . furosemide  40 mg Intravenous Daily  . gabapentin  300 mg Oral TID  . Gerhardt's butt cream   Topical BID  . Influenza vac split quadrivalent PF  0.5 mL Intramuscular Tomorrow-1000  . insulin aspart  0-15 Units  Subcutaneous TID WC  . insulin aspart  0-5 Units Subcutaneous QHS  . insulin aspart  5 Units Subcutaneous TID WC  . insulin glargine  45 Units Subcutaneous QHS  . ipratropium-albuterol  3 mL Nebulization BID  . magic mouthwash w/lidocaine  5 mL Oral TID  . methocarbamol  750 mg Oral QID  . midodrine  5 mg Oral BID WC  . nystatin  5 mL Oral TID AC & HS  . protein supplement shake  11 oz Oral QID  . romiPLOStim  250 mcg Subcutaneous Q Thu  . sodium chloride flush  10-40 mL Intracatheter Q12H   Continuous Infusions: . sodium chloride 10 mL/hr at 07/25/18 2300  . fluconazole (DIFLUCAN) IV 400 mg (07/26/18 2202)     LOS: 524days     GBonnielee Haff MD [facility] Pager [phone] If 7PM-7AM, please contact night-coverage [email or web address] Password [email or web address] 07/27/2018, 11:48 AM

## 2018-07-27 NOTE — Progress Notes (Addendum)
I am a little puzzled about the CT angiogram result.  He certainly is not behaving as somebody who has a acute pulmonary embolism.  I just wonder if this "acute" embolism is what they saw back in July.  He is not having hypoxia.  He is not having tachycardia.  There is no dyspnea.  He is to have Dopplers done of his legs today.  His labs not back yet today.  His platelet count was lower yesterday.  He got some platelets.  His LDH has been trending upward.  I still do not have an obvious explanation for all of this.    I will try to give him a dose of Nplate today.  I thought about the possibility of atypical HUS which might explain a lot of this.  However, he has no renal issues.  There is no change in the weakness over on his left side.  I will make his nebulizers on schedule right now.  There is no bleeding.  I just wonder if we can now try him on systemic anticoagulation.  He has had no bleeding issues with respect to the surgical site for 2 or 3 weeks now.  This is an incredibly complex situation.  There really is no change on his physical exam.  All of his vital signs still look quite stable.  His lungs sound clear.  I hear no rubs.  He has no rhonchi.  There is some occasional wheezes.  Cardiac exam regular rate and rhythm.  Abdomen is soft.  He is obese.  Extremities shows minimal edema in his extremities.  We will continue to try to help as much as we can.  It might be a good idea to get another MRI of the surgical site to make sure that there is no bleeding.  Christin BachPete Ennever, MD  1 John 2:17  ADDENDUM: I spoke with radiology this morning.  They reviewed all of his scans.  This CT angiogram that he had done last night showed a new pulmonary embolism.  The embolism that was seen on the CT angiogram back in July 2019 had the pulmonary embolism that is now resolved.  I am absolutely amazed that he could have developed a new pulmonary embolism given that he has a IVC filter in  place.  I suppose that he may have thrown and embolism from the filter.  He had a CT of the abdomen/pelvis a couple weeks ago.  This showed the filter but no obvious thrombus.  I suppose that he might need to have a CT angiogram of the abdomen/pelvis to look for a thrombus associated with the filter.  Clearly, if untreated, this pulmonary embolism will take his life.  We are truly between a "rock and a hard place" and are going to have to anticoagulate him.  I spoke with his neurosurgeon, Dr. Donalee CitrinGary Cram, who agreed that anticoagulation is now indicated.  An MRI was done of the surgical site a couple weeks ago which, thankfully, did not show any hematoma.  I will have pharmacy dose argatroban for us.  I think this would be best as there should be no issues with respect to thrombocytopenia which might be exacerbated by heparin.  Again, I just cannot find a unifying diagnosis for all of this.  I keep thinking about a microangiopathic process but I really cannot find anything that looks like hemolytic anemia.  His renal function is normal.  I would not think that this is a myelodysplasia given that  he has no abnormal chromosomes.  I will send off a NGS analysis of his blood to see if he has any type of genetic alterations which might suggest myelodysplasia.  This is truly a very unique situation.  I know that everybody is working very hard to try to get [name] [name] through these issues.  I have to give a lot of credit to the staff up on [facility] NP for the great job that they are doing.  [name], MD  1 [place]les 16:11

## 2018-07-27 NOTE — Progress Notes (Addendum)
ANTICOAGULATION CONSULT NOTE -  Consult Note   Pharmacy Consult for Bivaliruidin Indication: pulmonary embolus and bilateral DVT  Allergies  Allergen Reactions  . Nsaids Other (See Comments)    G6PD DEFICIENCY  . Sulfa Antibiotics Other (See Comments)    G6PD DEFICIENCY  . Protonix [Pantoprazole Sodium] Diarrhea    Severe diarrhea, Upset stomach    Patient Measurements: Height: 5\' 9"  (175.3 cm) Weight: 281 lb 4.9 oz (127.6 kg) IBW/kg (Calculated) : 70.7 Bivalirudin Dosing Weight: 127.6 kg  Vital Signs: Temp: 97.7 F (36.5 C) (11/14 1210) Temp Source: Oral (11/14 1210) BP: 127/88 (11/14 1210) Pulse Rate: 76 (11/14 1210)  Labs: Recent Labs    07/25/18 0508 07/26/18 0600 07/27/18 0430  HGB 10.1* 9.8* 9.9*  HCT 32.9* 31.9* 31.2*  PLT 36* 27* 57*  APTT 24 24 24   LABPROT  --   --  15.6*  INR  --   --  1.25  CREATININE  --  0.74 0.65    Estimated Creatinine Clearance: 142.8 mL/min (by C-G formula based on SCr of 0.65 mg/dL).  Assessment:  52 yr old male with new PE per 11/13 CTA and bilateral DVT per duplex today, and thrombocytpenia. Has IVC filter.  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  Last MRI (11/10) did not show evidence of compressive hematoma.  Hx PE and DVT and was on Eliquis prior to admit.   Dr. Myna HidalgoEnnever and Dr. Wynetta Emeryram discussed risk/benefit and to begin Bivalirudin with low therapeutic goal.  Previously on Argatroban 10/22>10/23 then Bivalirudin 10/23>10/25. Noted switched due to Argatroban possibly contributing to bradycardia and hypotension. Discussed with Dr. Myna HidalgoEnnever, to use Bivalirudin. Prior aPTTs were 60 and 58 seconds on Bivalirudin at 0.15 mg/kg/hr.    2nd weekly Nplate dose given today.  No bleeding reported.  Goal of Therapy:  aPTT 50-67 seconds (lower end of usual range 50-85 seconds) seconds Monitor platelets by anticoagulation protocol: Yes   Plan:   Begin Bivalirudin at 0.15 mg/kg/hr  aPTT  ~2 hrs after drip begins.  CBC daily. APTT at least daily.  Monitor for any signs or symptoms of bleeding.  Dennie Fettersgan, Haldon Carley Donovan, [place] Pager: [phone] or phone: [phone] 07/27/2018,3:48 PM

## 2018-07-27 NOTE — Progress Notes (Addendum)
*  Preliminary Results* Bilateral lower extremity venous duplex completed. Bilateral lower extremities are positive for deep vein thrombosis involving bilateral saphenofemoral junctions, common femoral, femoral, popliteal, posterior tibial, and peroneal veins. A single left posterior tibial vein and single left peroneal vein are now patent. There is no evidence of Baker's cyst bilaterally.  07/27/2018 10:29 AM Gertie FeyMichelle Kylea Berrong, MHA, RVT, RDCS, RDMS

## 2018-07-28 ENCOUNTER — Inpatient Hospital Stay (HOSPITAL_COMMUNITY)

## 2018-07-28 LAB — PROTEIN C ACTIVITY: Protein C Activity: 184 % — ABNORMAL HIGH (ref 73–180)

## 2018-07-28 LAB — LACTATE DEHYDROGENASE: LDH: 394 U/L — ABNORMAL HIGH (ref 98–192)

## 2018-07-28 LAB — CBC
HCT: 29.8 % — ABNORMAL LOW (ref 39.0–52.0)
HEMOGLOBIN: 9.7 g/dL — AB (ref 13.0–17.0)
MCH: 30.8 pg (ref 26.0–34.0)
MCHC: 32.6 g/dL (ref 30.0–36.0)
MCV: 94.6 fL (ref 80.0–100.0)
Platelets: 58 10*3/uL — ABNORMAL LOW (ref 150–400)
RBC: 3.15 MIL/uL — ABNORMAL LOW (ref 4.22–5.81)
RDW: 16.7 % — ABNORMAL HIGH (ref 11.5–15.5)
WBC: 5.1 10*3/uL (ref 4.0–10.5)
nRBC: 1.9 % — ABNORMAL HIGH (ref 0.0–0.2)

## 2018-07-28 LAB — GLUCOSE, CAPILLARY
GLUCOSE-CAPILLARY: 93 mg/dL (ref 70–99)
Glucose-Capillary: 39 mg/dL — CL (ref 70–99)
Glucose-Capillary: 124 mg/dL — ABNORMAL HIGH (ref 70–99)
Glucose-Capillary: 331 mg/dL — ABNORMAL HIGH (ref 70–99)
Glucose-Capillary: 361 mg/dL — ABNORMAL HIGH (ref 70–99)

## 2018-07-28 LAB — APTT
APTT: 56 s — AB (ref 24–36)
APTT: 57 s — AB (ref 24–36)
aPTT: 53 seconds — ABNORMAL HIGH (ref 24–36)

## 2018-07-28 LAB — PROTEIN S, TOTAL: Protein S Ag, Total: 133 % (ref 60–150)

## 2018-07-28 LAB — PROTEIN S ACTIVITY: Protein S Activity: 94 % (ref 63–140)

## 2018-07-28 LAB — PROCALCITONIN: PROCALCITONIN: 0.38 ng/mL

## 2018-07-28 MED ORDER — INSULIN ASPART 100 UNIT/ML ~~LOC~~ SOLN
0.0000 [IU] | Freq: Three times a day (TID) | SUBCUTANEOUS | Status: DC
  Administered 2018-07-29: 2 [IU] via SUBCUTANEOUS
  Administered 2018-07-29: 3 [IU] via SUBCUTANEOUS
  Administered 2018-07-30 (×2): 7 [IU] via SUBCUTANEOUS

## 2018-07-28 MED ORDER — LOPERAMIDE HCL 2 MG PO CAPS
4.0000 mg | ORAL_CAPSULE | Freq: Once | ORAL | Status: AC
  Administered 2018-07-28: 4 mg via ORAL
  Filled 2018-07-28: qty 2

## 2018-07-28 MED ORDER — IOPAMIDOL (ISOVUE-370) INJECTION 76%
100.0000 mL | Freq: Once | INTRAVENOUS | Status: AC | PRN
  Administered 2018-07-28: 100 mL via INTRAVENOUS

## 2018-07-28 MED ORDER — INSULIN GLARGINE 100 UNIT/ML ~~LOC~~ SOLN
30.0000 [IU] | Freq: Every day | SUBCUTANEOUS | Status: DC
  Administered 2018-07-28 – 2018-07-29 (×2): 30 [IU] via SUBCUTANEOUS
  Filled 2018-07-28 (×2): qty 0.3

## 2018-07-28 MED ORDER — ADULT MULTIVITAMIN W/MINERALS CH
1.0000 | ORAL_TABLET | Freq: Every day | ORAL | Status: DC
  Administered 2018-07-28 – 2018-09-10 (×45): 1 via ORAL
  Filled 2018-07-28 (×44): qty 1

## 2018-07-28 MED ORDER — ENSURE ENLIVE PO LIQD
237.0000 mL | Freq: Four times a day (QID) | ORAL | Status: DC
  Administered 2018-07-28 – 2018-08-03 (×21): 237 mL via ORAL

## 2018-07-28 MED ORDER — IOPAMIDOL (ISOVUE-370) INJECTION 76%
INTRAVENOUS | Status: AC
  Filled 2018-07-28: qty 100

## 2018-07-28 MED ORDER — LOPERAMIDE HCL 2 MG PO CAPS
2.0000 mg | ORAL_CAPSULE | Freq: Three times a day (TID) | ORAL | Status: DC | PRN
  Administered 2018-07-28 – 2018-08-07 (×8): 2 mg via ORAL
  Filled 2018-07-28 (×10): qty 1

## 2018-07-28 NOTE — Progress Notes (Signed)
Initial Nutrition Assessment  DOCUMENTATION CODES:   Morbid obesity  INTERVENTION:   -D/c Premier Protein QID, each supplement provides 160 kcals and 30 grams protein -Ensure Enlive po QID, each supplement provides 350 kcal and 20 grams of protein -MVI with minerals daily -Encourage PO intake and allow for pt to consume outside food  NUTRITION DIAGNOSIS:   Increased nutrient needs related to wound healing, post-op healing as evidenced by estimated needs.  GOAL:   Patient will meet greater than or equal to 90% of their needs  MONITOR:   PO intake, Supplement acceptance, Labs, Weight trends, Skin, I & O's  REASON FOR ASSESSMENT:   Low Braden    ASSESSMENT:   [name] [name] is a 52 y.o. male with history of hypertension who has had a recent ACDF at C3 C5 by Dr. Saintclair Halsted in July 7341 complicated by pulmonary embolism was placed on apixaban subsequently discharged to rehab and patient had moved to [place] area to [place] and has been living with his parents.  Had gone to ER around August 27 for complaints of increasing weakness and at the time was found to be febrile and was admitted for possible pneumonia.  He started antibiotic as per the ER physician with whom I discussed.  Patient was eventually discharged to rehab and patient was discharged from rehab last week.  Patient had come to [facility] for possible IVC filter placement for his known DVT and on reaching back home in [place] patient on trying to get out of the car started finding it difficult to walk and his legs gave away.  He also states he inadvertently turned his neck.  Following which patient has been having increasing weakness difficult to walk.  Prior to which patient was able to walk up the stairs.  Denies any incontinence of urine or bowel.  Neck pain has been chronic.  Denies any chest pain or shortness of breath.  Pt admitted with bilateral lower extremity weakness, with history of cervical myelopathy and  quadriparesis.   9/27- s/p rt knee aspiration and injections 10/2- s/p posterior cervical decompressive laminectomy at C3, C4, C5 with removal of the spinous process and complete laminectomy and foraminotomies of the C3, C4, C5 nerve roots; Posterior cervical fusion with lateral mass screws at C3-C4-C5 and pars screws at C2 lies in the globus ellipse Lateral mass screw system; Posterior lateral arthrodesis C2-C5 utilizing locally harvested autograft mixed with vivigen 10/10- s/p reexploration posterior cervical wound for evacuation of epidural hematoma 10/19- s/p cervical wound revision with evacuation of cervical epidural hematoma 10/25- s/p reexploration of posterior surgical cervical wound for evacuation of epidural hematoma 10/29- s/p xxploration of posterior cervical wound and evacuation of epidural hematoma 10/30- s/p bone marrow biopsy  Spoke with pt at bedside, who has flat affect, but engaged in conversation with RD. He reports PTA he had a very good appetite; he is a power lifter and ensure he gets a lot of protein in his diet (pt shares he often consumes at least 2 servings of protein with meals). He was not losing weight PTA.   Pt reports that he has a good appetite, however, is becoming exhausted of food options on the hospital menu. Noted meal completion 0-50%. He reports he never eats breakfast and usually consumes very little of lunch and dinner meal; he has been having friends and family members bring outside food for him. Pt is very focused on protein intake in his diet; he shares he has been consuming [employer]  Protein shakes that have been ordered. These have been provided and pt has been consuming them (noted one empty carton in trash). Offered other supplements on the formulary (ex Prostat), however, pt refused due to poor tolerance of artificial sweeteners.   Pt denies any weight loss, but suspects he has lost weight during hospitalization. Reviewed wt hx, however, and weight has  increased over the past 3 months. Suspect some weight gain may be related to edema.   Discussed with pt importance of consuming meals and supplements to promote healing. Offered for nutritional services manager to come speak with pt regarding food options; pt is agreeable to this plan. Also encouraged pt to have friends and family members bring in outside food for him to consume.   Labs reviewed: CBGS: 39-124 (inpatient orders for glycemic control are 0-15 units insulin aspart TID with meals, 0-5 units insulin aspart q HS, 5 units insulin aspart TID with meals, and 45 units insulin glargine daily).   NUTRITION - FOCUSED PHYSICAL EXAM:    Most Recent Value  Orbital Region  No depletion  Upper Arm Region  No depletion  Thoracic and Lumbar Region  No depletion  Buccal Region  No depletion  Temple Region  No depletion  Clavicle Bone Region  No depletion  Clavicle and Acromion Bone Region  No depletion  Scapular Bone Region  No depletion  Dorsal Hand  No depletion  Patellar Region  No depletion  Anterior Thigh Region  No depletion  Posterior Calf Region  No depletion  Edema (RD Assessment)  Mild  Hair  Reviewed  Eyes  Reviewed  Mouth  Reviewed  Skin  Reviewed  Nails  Reviewed       Diet Order:   Diet Order            Diet regular Room service appropriate? Yes; Fluid consistency: Thin  Diet effective now              EDUCATION NEEDS:   Education needs have been addressed  Skin:  Skin Assessment: Skin Integrity Issues: Skin Integrity Issues:: Stage II, Incisions Stage II: rt ischial tuberosity Incisions: closed neck  Last BM:  07/28/18  Height:   Ht Readings from Last 1 Encounters:  06/22/18 _0  (1.753 m)    Weight:   Wt Readings from Last 1 Encounters:  07/27/18 127.6 kg    Ideal Body Weight:  72.7 kg  BMI:  Body mass index is 41.54 kg/m.  Estimated Nutritional Needs:   Kcal:  2100-2300  Protein:  145-160 grams  Fluid:  > 2.1 L    Burech Mcfarland A.  Jimmye Norman, RD, LDN, CDE Pager: [phone] After hours Pager: [phone]

## 2018-07-28 NOTE — Progress Notes (Signed)
OT Cancellation Note  Patient Details Name: Azucena FreedCornell Evetts MRN: [id number] DOB: 17-May-1966   Cancelled Treatment:    Reason Eval/Treat Not Completed: Other (comment); pt declined OT tx today, reports not feeling up for anything, appears somewhat down and with flat affect. Will follow up as schedule permits.   Marcy SirenBreanna Jishnu Jenniges, OT Supplemental Rehabilitation Services Pager [phone] Office [phone]   Orlando PennerBreanna L Minas Bonser 07/28/2018, 1:14 PM

## 2018-07-28 NOTE — Progress Notes (Signed)
PT Cancellation Note  Patient Details Name: [name] [name] MRN: [id number] DOB: September 06, 1966   Cancelled Treatment:    Reason Eval/Treat Not Completed: Other (comment).  Pt very flat today, just back from CT and was hungry as he was NPO for CT scan.  He is agreeable for PT to check back later today as time allows.  Otherwise, PT to continue to follow to encourage mobility.   Thanks,  Rollene Rotundaebecca B. Dorsel Flinn, PT, DPT  Acute Rehabilitation [phone] pager [phone] office

## 2018-07-28 NOTE — Progress Notes (Signed)
Results for [name], Mattis (MRN [id number]) as of 07/28/2018 13:38  Ref. Range 07/27/2018 17:43 07/27/2018 22:05 07/28/2018 07:36 07/28/2018 11:47 07/28/2018 12:39  Glucose-Capillary Latest Ref Range: 70 - 99 mg/dL 782263 (H) 956286 (H) 93 39 (LL) 124 (H)  Noted that patient had a low blood sugar of 39 mg/dl this am. Recommend decreasing Novolog correction scale to SENSITIVE (0-9 units) TID  & HS. If blood sugars continue to be less than 70 mg/dl, decrease Lantus to 40 units daily. Will continue to follow blood sugars while in the hospital.  Smith MinceKendra Khamil Lamica RN BSN CDE Diabetes Coordinator Pager: [phone]  8am-5pm

## 2018-07-28 NOTE — Progress Notes (Signed)
PROGRESS NOTE    [name] [name]  ZYS:[id number] DOB: [birth year] DOA: 06/06/2018 PCP: Helane Rima, MD    Brief Narrative: Brief Narrative:  52 year old man with past medical history relevant for gout, hypertension anterior cervical corpectomy of C4 on 03/24/2018 with comp gated postoperative course including development of multiple PEs and DVTs started on anticoagulation and subsequently developed hematoma requiring reexploration on 03/28/2018 status post placement of IVC filter who was discharged to skilled nursing facility and had a complicated course including progressive lower extremity weakness, intermittent fevers of unclear etiology and hospitalizations for "pneumonia". Patient was admitted to this hospital is a transfer from [facility] with MRI of C-spine that showed postoperative changes and cord compression with cord edema and concern for ischemia. Patient is status post posterior cervical decompression laminectomy of C3, C4, C5, removal of spinous processes, foraminotomies on 06/14/2018. On 06/22/2018 patient began to develop worsening left sided paresthesias and weakness and a stat MRI showed evidence of postoperative hematoma. Patient was taken to the OR with evacuation of hematoma. Additionally his postoperative course has been complicated by recurrent fevers,bilateral lower extremity DVTs on ultrasound,migratory arthralgias/arthritis that are thought to be rheumatologic in nature as well as significant orthostasis limiting the amount he can work with physical therapy. His arthralgia has resolved.   Noted to have decreased left hand grip on 10/19, MRI showed large epidural hematoma, patient s/p clot evacuation.  He was place back on anticoagulation argatroban on 10-22. He was notice to be more bradycardic with HR in the 40 on 10-23. Cardiology was consulted, and argatroban was transition to bivalirrubin to avoid bradycardia. His HR improved after argatroban was discontinue.  -HIT panel 0.486 (mildly elevated)- SRA (serotonin release assay) negative. Dr Marin Olp has been helping with patient care.   Patient developed weakness of left arm on 10-25. He was take emergently to OR for recurrence of epidural hematoma on 10-25. Anticoagulation was discontinue since 10-25. Dr Saintclair Halsted is recommending for patient to remain off anticoagulation for alt leas 1 week, and if anticoagulation is consider, that will need to be discuss with DR Saintclair Halsted.  Patient the night of 10-28 develops left arm weakness, numbness. Stat MRI was ordered, which confirm recurrent hematoma. Patient was taken to OR for hematoma evacuation 10-28. He has been off anticoagulation since 10-25. For his thrombocytopenia, a Bone marrow biopsy was done. For LE DVT, he has IVC filter. He cannot be on anticoagulation due to recurrent bleeding.  Patient has received multiple platelet transfusions.  Patient underwent CT scan of his chest on 11/13 which showed new pulmonary embolism.  Patient was started on bivalirudin.     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  -MRI done on 11/10 shows improvement in the fluid collection.  Other findings as per neurosurgery. Neurosurgery continues to follow.  Based on last MRI findings there is no plan for any further surgical intervention.  Dexamethasone dose has been slowly tapered down.  Patient continues to have weakness on the left side.   Acute pulmonary embolism/DVT in setting of spine surgery -Status post IVC filter placement on March 28, 2018 -Patient was on apixaban and changed over to IV heparin.  However these agents had to be discontinued due to recurrent episodes of hematoma at the surgical site.  -Vascular DVT US 06/26/2018 showed bilateral lower extremity DVT likely acute -Case was discussed with  vascular surgery, Dr. Trula Slade.  Also discussed with interventional radiology.  Unfortunately patient does not have any good options vis--vis either thrombolysis or mechanical thrombectomy. -Bivalirudin discontinue by neurosurgery. Serotonin release assay negative.  Remains off of anticoagulation due to bleeding episodes and severe thrombocytopenia CT scan 11/13 showed new pulmonary embolism with evidence for right heart strain.   Clinically patient has been stable without any hemodynamic compromise.  Echocardiogram does not show any acute findings.  Discussions held between hematology as well as neurosurgery.  Patient was started on bivalirudin.  Monitor closely for signs of bleeding.  Platelet counts are stable this morning.    Thrombocytopenia/coagulopathy -Cardiolipin antibody negative -Lupus anticoagulant, homocysteine level, factor VIII within normal limits -Protein S activity 60 (low) -Protein C total >200; Activity 199 (high)  -HIT panel 0.486 (mildly elevated)- SRA (serotonin release assay) negative -immature platelet fraction WNL -Was given 2u platelets on 10/19 in the OR. And 2 units 10-25 for OR. Platelet transfusion 10/29 Patient  transfused platelets and given vitamin K on 10/30. Patient underwent bone marrow biopsy on 10/30.  Pathology report suggests nonspecific findings.   Hematology suspects that some of his coagulopathy and platelet low platelet counts are likely due to medications.  But no clear culprit has been identified.   Platelet counts noted to be low on 11/13.  Patient was transfused platelets.  She was given Nplate. Platelet count stable this morning.  At patient's request I did called [facility] on 11/14 and I spoke to Dr. Dyann Kief who is a hematology/oncology specialist.  This case was discussed in detail.  Patient has had a 50-day stay in the hospital so there was a lot of information that was discussed.    They agree with all the approaches we have taken so far.  No new recommendations from their end.  They recommend initiating anticoagulation and watch closely for signs of bleeding.  They also agree with consideration of IVIG if needed.  They do not feel that they will be able to offer anything apart from what we are doing here in the hospital.  This was communicated to the patient who was satisfied with this response.  Normocytic anemia Patient did get 1 unit of PRBC earlier during this hospitalization.  Hemoglobin has been stable.  No evidence of overt bleeding.  Orthostatic hypotension with near syncope Thought to be related to hypovolemia and autonomic dysfunction.  Echocardiogram July 2019 showed a normal systolic function, grade 2 diastolic dysfunction.  No significant valve abnormalities noted. Patient was placed on midodrine.  Midodrine dose was decreased due to bradycardia.  Abdominal binder.  Seems to be stable.  Sinus bradycardia Patient initially developed bradycardia on 10/23.  He was asymptomatic.  TSH was  1.24.  Cardiology was consulted at that time.  There was no indication for pacemaker.  His heart rate stabilized and improved.  He again became bradycardic on 11/1.  EKG  shows sinus bradycardia.  Telemetry did not show any pauses.  Dose of midodrine was decreased as midodrine can sometimes cause bradycardia.  Autonomic dysfunction is another possible etiology.  Avoid AV nodal blocking agents.  Heart rate is stable.  Polyarthralgia/persistent fever -At the time of admission patient had symptoms in the shoulders and ankles, along with a history of gout and pain in his knees.  He underwent arthrocentesis however no evidence of infection was found. -He was noted to have elevated inflammatory markers including CCP, CRP.  However ANCA, aldolase, ANA were negative. -Previous rounding physicians have discussed with Dr. Kathlene November, rheumatologist, he will follow-up with the patient as an outpatient. -Patient was placed on prednisone however currently on dexamethasone (neurosurgery) No further episodes of fever.  Patient was on doxycycline for unclear reason and this was discontinued on 10/31.    Upper and lower extremity edema Has improved significantly with diuresis.  Continue with Lasix once a day for now.  Check weights.  Gout Stable.  Uloric has been placed on hold.  Acute renal failure/hypokalemia Renal function is back to baseline.  Remains stable.  Monitor while he is getting diuretics.  Essential hypertension  Amlodipine discontinue due to orthostatic hypotension and edema.  Blood pressure remains stable.  Hyperglycemia secondary to steroid CBGs have improved.  Steroid is being tapered down.  May need to decrease the dose of Lantus.  HbA1c 5.8.  Adjustment disorder with depression and anxiety Patient noted to have flat affect and depressed mood and so psychiatry was consulted.  They recommended changing Xanax to Klonopin on a scheduled basis.  They also recommended sertraline.  He was changed over from alprazolam to Klonopin however did not start him on sertraline due to his hematological issues.    Abnormal CT findings CT scan raised concern for pneumonia but  patient respiratory symptoms.  Patient did not have any respiratory symptoms.  Gallstones were noted as well.    Stage II pressure ulcer on the right ischial tuberosity Wound care nurse is following.  Suspected Oropharyngeal candidiasis Patient noted to be on Diflucan.  Continue for now.   DVT prophylaxis: TED stockings Code Status: Full code Family Communication: Discussed with the patient Disposition Plan: Management as outlined above.  Anticoagulation started yesterday.  Monitor closely for signs of bleeding.       Consultants:   Neurosurgery  Hematology  Cardiology    Procedures: On 10/2 1. posterior cervical decompressive laminectomy at C3, C4, C5 with removal of the spinous process and complete laminectomy and foraminotomies of the C3, C4, C5 nerve roots. 2. Posterior cervical fusion with lateral mass screws at C3-C4-C5 and pars screws at C2 lies in the globus ellipseLateral mass screw system 3. Posterior lateral arthrodesis C2-C5 utilizing locally harvested autograft mixed with vivigen  06/22/2018: Procedure reexploration posterior cervical wound for evacuation of epidural hematoma  06/26/2018 DVT ultrasound:Right: Findings consistent with acute deep vein thrombosis involving the right common femoral vein, right femoral vein, right popliteal vein, right posterior tibial vein, and right peroneal vein. Thrombosis extends proximally into external iliac vein.  Proximal iliac vein not visualized. Left: Findings consistent with acute deep vein thrombosis involving the left common femoral vein, left femoral vein, left popliteal vein, left posterior tibial vein, and left peroneal vein. Findings consistent with acute superficial vein thrombosis  involving the left great saphenous vein. Thrombosis extends proximally into external iliac vein. Proximal iliac vein not visualized.  07/01/18 Clot evacuation   RUE doppler 07/02/18    Antimicrobials:  Completed course of  Ancef  Subjective: Patient slightly upset about the new blood clots.  Denies any chest pain shortness of breath.  No nausea vomiting.  Still with weakness on the left side.  Objective: Vitals:   07/27/18 2357 07/27/18 2358 07/28/18 0300 07/28/18 0731  BP: (!) 134/96  (!) 135/91 130/90  Pulse: 93   88  Resp: (!) 26   12  Temp:  98.3 F (36.8 C) 98.3 F (36.8 C) 98.1 F (36.7 C)  TempSrc:  Oral Oral Oral  SpO2: 98%  95% 95%  Weight:      Height:        Intake/Output Summary (Last 24 hours) at 07/28/2018 1030 Last data filed at 07/28/2018 0300 Gross per 24 hour  Intake 360 ml  Output 1750 ml  Net -1390 ml   Filed Weights   07/07/18 1000 07/18/18 0600 07/27/18 1502  Weight: 123.8 kg 135 kg 127.6 kg    Examination:  General exam: Awake alert.  In no distress Respiratory system: Normal effort at rest.  Clear to auscultation bilaterally Cardiovascular system: S1-S2 is normal regular.  No S3-S4.  No rubs murmurs or bruit Gastrointestinal system: Abdomen soft.  Nontender nondistended Improving edema bilateral upper and lower extremities    Data Reviewed: I have personally reviewed following labs and imaging studies  CBC: Recent Labs  Lab 07/22/18 0500 07/23/18 0455 07/24/18 0440 07/25/18 0508 07/26/18 0600 07/27/18 0430 07/28/18 0139  WBC 3.9* 2.7* 2.8* 3.5* 4.2 4.4 5.1  NEUTROABS 3.4 2.0 2.1 2.7 2.6  --   --   HGB 10.2* 10.2* 9.9* 10.1* 9.8* 9.9* 9.7*  HCT 33.4* 32.7* 32.6* 32.9* 31.9* 31.2* 29.8*  MCV 97.7 97.0 97.6 97.1 97.0 97.8 94.6  PLT 70* 54* 44* 36* 27* 57* 58*   Basic Metabolic Panel: Recent Labs  Lab 07/22/18 0500 07/23/18 0455 07/24/18 0440 07/26/18 0600 07/27/18 0430  NA 141 143 143 142 142  K 3.5 3.4* 3.8 3.2* 3.6  CL 100 100 102 102 103  CO2 35* 33* 35* 34* 28  GLUCOSE 230* 205* 256* 164* 280*  BUN 46* 50* 52* 57* 51*  CREATININE 0.70 0.83 0.75 0.74 0.65  CALCIUM 9.2 9.0 9.4 9.3 9.4  MG  --   --   --   --  2.0   GFR: Estimated  Creatinine Clearance: 142.8 mL/min (by C-G formula based on SCr of 0.65 mg/dL).  Coagulation Profile: Recent Labs  Lab 07/27/18 0430  INR 1.25   CBG: Recent Labs  Lab 07/27/18 1205 07/27/18 1305 07/27/18 1743 07/27/18 2205 07/28/18 0736  GLUCAP 330* 335* 263* 286* 93      Radiology Studies: Ct Angio Chest Pe W Or Wo Contrast  Addendum Date: 07/27/2018   ADDENDUM REPORT: 07/27/2018 10:43 ADDENDUM: The referring provider has asked that this exam be compared to the study from 03/27/2018. I have reviewed only the pulmonary arteries on the current study. The exam from 03/27/2018 demonstrated acute pulmonary embolus that had resolved by the study of 07/15/2018. The pulmonary embolic disease on the current study (07/26/2018) is therefore new since 07/15/2018 and is present in pulmonary arteries that were free of embolus on the 03/27/2018 exam (compare right main and interlobar pulmonary arteries on the 07/26/2018 and 03/27/2018 exams), again confirming this is new embolic disease since  07/15/2018. Electronically Signed   By: Misty Stanley M.D.   On: 07/27/2018 10:43   Addendum Date: 07/27/2018   ADDENDUM REPORT: 07/27/2018 00:59 ADDENDUM: Critical Value/emergent results were called by telephone at the time of interpretation on 07/27/2018 at 12:58 am to Dr. Hilbert Bible, hospitalist , who verbally acknowledged these results. Electronically Signed   By: Elon Alas M.D.   On: 07/27/2018 00:59   Addendum Date: 07/27/2018   ADDENDUM REPORT: 07/27/2018 00:13 ADDENDUM: Acute findings discussed with and reconfirmed by Dr.Buccini, GI on 07/26/2018 at 12:13 am. Electronically Signed   By: Elon Alas M.D.   On: 07/27/2018 00:13   Result Date: 07/27/2018 CLINICAL DATA:  Follow up pulmonary embolism. EXAM: CT ANGIOGRAPHY CHEST WITH CONTRAST TECHNIQUE: Multidetector CT imaging of the chest was performed using the standard protocol during bolus administration of intravenous contrast. Multiplanar  CT image reconstructions and MIPs were obtained to evaluate the vascular anatomy. CONTRAST:  180m ISOVUE-370 IOPAMIDOL (ISOVUE-370) INJECTION 76% COMPARISON:  Chest radiograph July 25, 2018 and CT chest July 15, 2018 FINDINGS: CARDIOVASCULAR: Adequate contrast opacification of the pulmonary artery's. Main pulmonary artery is not enlarged. Central bilateral main pulmonary artery nonocclusive pulmonary emboli casting into lobar, segmental and subsegmental branches, some of which are occlusive. RIGHT heart stain (RV/LV 1.4). Heart size is upper limits of normal. No pericardial effusion. MEDIASTINUM/NODES: No lymphadenopathy by CT size criteria. LEFT PICC distal tip and proximal superior vena cava. LUNGS/PLEURA: Tracheobronchial tree is patent, no pneumothorax. Low inspiratory examination with elevated LEFT hemidiaphragm. Patchy LEFT lower lobe consolidation. Bibasilar bandlike atelectasis. No pleural effusion. 2 mm lingular subpleural pulmonary nodules unchanged. UPPER ABDOMEN: Non-acute. Moderate amount of retained large bowel stool. MUSCULOSKELETAL: Non-acute. 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 Review of the MIP images confirms the above findings. IMPRESSION: 1. Positive for acute PE with CT evidence of right heart strain (RV/LV Ratio = 1.4) consistent with at least submassive (intermediate risk) PE. The presence of right heart strain has been associated with an increased risk of morbidity and mortality. 2. LEFT lower lobe infarct versus pneumonia.  Bibasilar atelectasis. 3. Mild cardiomegaly. Electronically Signed: By: CElon AlasM.D. On: 07/26/2018 23:34        Scheduled Meds: . clonazePAM  0.5 mg Oral BID  . dexamethasone  4 mg Intravenous Q24H  . furosemide  40 mg Intravenous Daily  . gabapentin  300 mg Oral TID  . Gerhardt's butt cream   Topical BID  . Influenza vac split quadrivalent PF  0.5 mL Intramuscular Tomorrow-1000  .  insulin aspart  0-15 Units Subcutaneous TID WC  . insulin aspart  0-5 Units Subcutaneous QHS  . insulin aspart  5 Units Subcutaneous TID WC  . insulin glargine  45 Units Subcutaneous QHS  . iopamidol      . magic mouthwash w/lidocaine  5 mL Oral TID  . methocarbamol  750 mg Oral QID  . midodrine  5 mg Oral BID WC  . nystatin  5 mL Oral TID AC & HS  . protein supplement shake  11 oz Oral QID  . romiPLOStim  250 mcg Subcutaneous Q Thu  . sodium chloride flush  10-40 mL Intracatheter Q12H   Continuous Infusions: . sodium chloride 10 mL/hr at 07/25/18 2300  . bivalirudin (ANGIOMAX) infusion 0.5 mg/mL (Non-ACS indications) 0.15 mg/kg/hr (07/28/18 0357)     LOS: 51 days     GBonnielee Haff MD [facility] Pager [phone] If 7PM-7AM, please contact night-coverage [email or web address]  Password [email or web address] 07/28/2018, 10:30 AM

## 2018-07-28 NOTE — Progress Notes (Signed)
Physical Therapy Treatment Patient Details Name: [name] [name] MRN: [id number] DOB: 1966-08-15 Today's Date: 07/28/2018    History of Present Illness 52 y.o. male admitted on 06/06/18 for bil LE weakness and muscle spasm after multiple falls (with resultant R knee meniscus tear placed in bledsoe brace) following a recent ACDF at C3 in July 2019 (complicated by PE (s/p IVC filter), CIR, and then SNF placement before finally returning home with his parents).  In the ED MRI of c-spine which showed post operative changes and cord edema and possibility of ischemia.  His R knee was aspirated on 06/09/18.  S/p posterior cervical decompression and fusion on 06/14/18. Entire acute course complicated by hypotension in standing (thought to be autonomic) and by finding of multiple LE DVTs (already has an IVC filter in place, but per MDs notes at risk for blocking the filter with these clots).  Pt with post op issues with incisional bleeding s/p 2 evacuations of hematomas and placement of hemovac (06/22/18 and 07/01/18) as well as multiple units of plasma.  Cardiology consulted 07/05/18 due to bradycardia thought to be from his newly started blood thinner.  Pt with another re-accumulation of epidural hematoma with increased L sided weakness and cord compression with emergent evacuation surgery 10/25 & 07/10/18.  Pt also needing more platelets post op.  Bone marrow bx 07/12/18. PMHx of HTN, gout, anemia, DDD (lumbar)    PT Comments    Pt in better spirits this PM when PT checked in and good timing too as pt was trying to have a BM and needed to be cleaned up.  He does better to push out a BM when positioned on his right side.  Pt is able to tell he is going/needs to go and call for clean up.  PT will continue to follow acutely for safe mobility progression.  Goals due next session.   Follow Up Recommendations  SNF     Equipment Recommendations  Wheelchair (measurements PT);Wheelchair cushion (measurements  PT);Hospital bed;Other (comment)(hoyer, 22x20 tilt in space power chair)    Recommendations for Other Services   NA     Precautions / Restrictions Precautions Precautions: Fall;Cervical Precaution Comments: quadriparesis Required Braces or Orthoses: Cervical Brace Cervical Brace: Hard collar;At all times    Mobility  Bed Mobility Overal bed mobility: Needs Assistance Bed Mobility: Rolling Rolling: Total assist;+2 for physical assistance         General bed mobility comments: Roll bil for peri care, pt having a BM.        Balance                                            Cognition Arousal/Alertness: Awake/alert Behavior During Therapy: Flat affect(but better than when I checked in AM. ) Overall Cognitive Status: Within Functional Limits for tasks assessed                                           General Comments General comments (skin integrity, edema, etc.): Positioned pt in supine for comfort, heels floated, arms floated for optimal reach to the table and his cell phone.        Pertinent Vitals/Pain Pain Assessment: Faces Faces Pain Scale: Hurts whole lot Pain Location: left leg with mobility Pain Descriptors / Indicators:  Burning Pain Intervention(s): Limited activity within patient's tolerance;Monitored during session;Repositioned           PT Goals (current goals can now be found in the care plan section) Acute Rehab PT Goals Patient Stated Goal: Pt playing music on the radio, wanting more answers, but coming to terms with not going to [facility]. Progress towards PT goals: Progressing toward goals    Frequency    Min 2X/week      PT Plan Current plan remains appropriate       AM-PAC PT "6 Clicks" Daily Activity  Outcome Measure  Difficulty turning over in bed (including adjusting bedclothes, sheets and blankets)?: Unable Difficulty moving from lying on back to sitting on the side of the bed? :  Unable Difficulty sitting down on and standing up from a chair with arms (e.g., wheelchair, bedside commode, etc,.)?: Unable Help needed moving to and from a bed to chair (including a wheelchair)?: Total Help needed walking in hospital room?: Total Help needed climbing 3-5 steps with a railing? : Total 6 Click Score: 6    End of Session Equipment Utilized During Treatment: Cervical collar Activity Tolerance: Patient tolerated treatment well Patient left: in bed;with call bell/phone within reach   PT Visit Diagnosis: Other abnormalities of gait and mobility (R26.89);Other symptoms and signs involving the nervous system (R29.898);Muscle weakness (generalized) (M62.81)     Time: [id number] PT Time Calculation (min) (ACUTE ONLY): 13 min  Charges:  $Therapeutic Activity: 8-22 mins                    Alaze Garverick B. Sahar Ryback, PT, DPT  Acute Rehabilitation [phone] pager #[phone] office   07/28/2018, 6:18 PM

## 2018-07-28 NOTE — Progress Notes (Addendum)
ANTICOAGULATION CONSULT NOTE -  Consult follow-up Note   Pharmacy Consult for Bivaliruidin Indication: pulmonary embolus and bilateral DVT  Allergies  Allergen Reactions  . Nsaids Other (See Comments)    G6PD DEFICIENCY  . Sulfa Antibiotics Other (See Comments)    G6PD DEFICIENCY  . Protonix [Pantoprazole Sodium] Diarrhea    Severe diarrhea, Upset stomach    Patient Measurements: Height: 5\' 9"  (175.3 cm) Weight: 281 lb 4.9 oz (127.6 kg) IBW/kg (Calculated) : 70.7 Bivalirudin Dosing Weight: 127.6 kg  Vital Signs: Temp: 98.1 F (36.7 C) (11/15 0731) Temp Source: Oral (11/15 0731) BP: 130/90 (11/15 0731) Pulse Rate: 88 (11/15 0731)  Labs: Recent Labs    07/26/18 0600 07/27/18 0430 07/27/18 1906 07/28/18 0139 07/28/18 1200  HGB 9.8* 9.9*  --  9.7*  --   HCT 31.9* 31.2*  --  29.8*  --   PLT 27* 57*  --  58*  --   APTT 24 24 63* 56* 53*  LABPROT  --  15.6*  --   --   --   INR  --  1.25  --   --   --   CREATININE 0.74 0.65  --   --   --     Estimated Creatinine Clearance: 142.8 mL/min (by C-G formula based on SCr of 0.65 mg/dL).  Assessment:  52 yr old male with new PE per 11/13 CTA and bilateral DVT per duplex today, and thrombocytpenia. Has IVC filter.  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  Last MRI (11/10) did not show evidence of compressive hematoma.  Hx PE and DVT and was on Eliquis prior to admit.   Dr. Myna HidalgoEnnever and Dr. Wynetta Emeryram discussed risk/benefit and to begin Bivalirudin with low therapeutic goal.  Previously on Argatroban 10/22>10/23 then Bivalirudin 10/23>10/25. Noted switched due to Argatroban possibly contributing to bradycardia and hypotension. Discussed with Dr. Myna HidalgoEnnever, to use Bivalirudin. Prior aPTTs were 60 and 58 seconds on Bivalirudin at 0.15 mg/kg/hr.    APTT 53 seconds at 12n on 015 mg/kg/hr. Remains at low therapeutic goal, but aPTTs have trended down 64>56>53 since infusion begun on 11/14.  RN  reports may have been paused while at CT this am.    2nd weekly Nplate dose given 11/14.  No bleeding reported.  Goal of Therapy:  aPTT 50-67 seconds (lower end of usual range 50-85 seconds) seconds Monitor platelets by anticoagulation protocol: Yes   Plan:   Continue Bivalirudin at 0.15 mg/kg/hr  Repeat aPTT at 8pm tonight.  CBC daily. APTT at least daily.  Monitor for any signs or symptoms of bleeding.  Day #4 Flucomazole 400 mg IV q24hrs for esophageal candidiasis.  Will follow up length of therapy. 14 days?  Dennie Fettersgan, Armari Fussell Donovan, [place] Pager: [phone] or phone: [phone] 07/28/2018,2:27 PM

## 2018-07-28 NOTE — Progress Notes (Signed)
ANTICOAGULATION CONSULT NOTE -  Consult follow-up Note   Pharmacy Consult for Bivaliruidin Indication: pulmonary embolus and bilateral DVT  Allergies  Allergen Reactions  . Nsaids Other (See Comments)    G6PD DEFICIENCY  . Sulfa Antibiotics Other (See Comments)    G6PD DEFICIENCY  . Protonix [Pantoprazole Sodium] Diarrhea    Severe diarrhea, Upset stomach    Patient Measurements: Height: 5\' 9"  (175.3 cm) Weight: 281 lb 4.9 oz (127.6 kg) IBW/kg (Calculated) : 70.7 Bivalirudin Dosing Weight: 127.6 kg  Vital Signs: Temp: 98.7 F (37.1 C) (11/15 2032) Temp Source: Oral (11/15 2032) BP: 125/89 (11/15 2032) Pulse Rate: 96 (11/15 2032)  Labs: Recent Labs    07/26/18 0600 07/27/18 0430  07/28/18 0139 07/28/18 1200 07/28/18 2017  HGB 9.8* 9.9*  --  9.7*  --   --   HCT 31.9* 31.2*  --  29.8*  --   --   PLT 27* 57*  --  58*  --   --   APTT 24 24   < > 56* 53* 57*  LABPROT  --  15.6*  --   --   --   --   INR  --  1.25  --   --   --   --   CREATININE 0.74 0.65  --   --   --   --    < > = values in this interval not displayed.    Estimated Creatinine Clearance: 142.8 mL/min (by C-G formula based on SCr of 0.65 mg/dL).  Assessment:  52 yr old male with new PE per 11/13 CTA and bilateral DVT per duplex today, and thrombocytpenia. Has IVC filter.  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  Last MRI (11/10) did not show evidence of compressive hematoma.  Hx PE and DVT and was on Eliquis prior to admit.   Dr. Myna HidalgoEnnever and Dr. Wynetta Emeryram discussed risk/benefit and to begin Bivalirudin with low therapeutic goal.  Previously on Argatroban 10/22>10/23 then Bivalirudin 10/23>10/25. Noted switched due to Argatroban possibly contributing to bradycardia and hypotension. Discussed with Dr. Myna HidalgoEnnever, to use Bivalirudin. Prior aPTTs were 60 and 58 seconds on Bivalirudin at 0.15 mg/kg/hr.  Evening PTT therapeutic at 57 seconds    2nd weekly Nplate dose  given 11/14.  No bleeding reported.  Goal of Therapy:  aPTT 50-67 seconds (lower end of usual range 50-85 seconds) seconds Monitor platelets by anticoagulation protocol: Yes   Plan:  Continue Bivalirudin at 0.15 mg/kg/hr CBC daily. APTT at least daily. Monitor for any signs or symptoms of bleeding.  Thank you Okey RegalLisa Lynnwood Beckford, PharmD [phone] 07/28/2018,8:51 PM

## 2018-07-28 NOTE — Progress Notes (Signed)
We now have [name] on bivalirudin.  I very much appreciate pharmacy help onset with this.  He we will get a CT angiogram of the abdomen and pelvis to see if there is thrombus on the IVC filter.  He has had no problems with cough or shortness of breath.  He has had no bleeding.  His platelet count has stabilized a little bit.  His platelet count is 58,000.  His white cell count is 5.1.  Hemoglobin is 9.7.  The LDH is 394.  This is stable.  His fibrinogen as it was 221.  His PTT prior to bivalirudin was 24 seconds.  His INR is 1.25.  I think that the hospitalist is [facility] if [facility] can uncover why he has this undefined coagulopathy.  So far, all the tests that we have done here have really been non-conclusive.  The only thing that I found that was unusual was a low fibrinogen level.  His appetite is a little improved.  He has had very good urine output.  His net intake/output since November 1 has been 22 L out.  He is not complaining of any pain.  There is still not much change in the strength in his left arm and leg.  I think he is getting out of bed a little bit.  He is getting out of bed with the help of a Hoyer lift.  His vital signs all look pretty good.  His temperature is 98.3.  Pulse is 84.  Blood pressure is 135/90.  Oxygen saturation is 95%.  His lungs sound clear bilaterally.  Cardiac exam regular rate and rhythm.  Abdomen is soft but obese.  Bowel sounds are present.  There is no obvious abdominal mass.  Extremities shows some mild edema as arms and legs.  He does have the weakness on the left arm and left leg.  Skin exam shows no ecchymoses or petechia.  Again, he now is on bivalirudin.  I would keep him on this for right now.  Hopefully, he will have no bleeding.  If we find that he has no issues with bleeding over the next 5 days, then we might be able to get him on an oral agent.  I appreciate everybody's  help.  I know this is a very complicated situation with no obvious answer as to why he has this coagulopathy.  Christin BachPete Danni Shima, MD  [facility] [place] 3:18-20

## 2018-07-28 NOTE — Progress Notes (Signed)
ANTICOAGULATION CONSULT NOTE -  Consult Note   Pharmacy Consult for Bivaliruidin Indication: pulmonary embolus and bilateral DVT  Allergies  Allergen Reactions  . Nsaids Other (See Comments)    G6PD DEFICIENCY  . Sulfa Antibiotics Other (See Comments)    G6PD DEFICIENCY  . Protonix [Pantoprazole Sodium] Diarrhea    Severe diarrhea, Upset stomach    Patient Measurements: Height: 5\' 9"  (175.3 cm) Weight: 281 lb 4.9 oz (127.6 kg) IBW/kg (Calculated) : 70.7 Bivalirudin Dosing Weight: 127.6 kg  Vital Signs: Temp: 98.3 F (36.8 C) (11/14 2358) Temp Source: Oral (11/14 2358) BP: 134/96 (11/14 2357) Pulse Rate: 93 (11/14 2357)  Labs: Recent Labs    07/26/18 0600 07/27/18 0430 07/27/18 1906 07/28/18 0139  HGB 9.8* 9.9*  --  9.7*  HCT 31.9* 31.2*  --  29.8*  PLT 27* 57*  --  58*  APTT 24 24 63* 56*  LABPROT  --  15.6*  --   --   INR  --  1.25  --   --   CREATININE 0.74 0.65  --   --     Estimated Creatinine Clearance: 142.8 mL/min (by C-G formula based on SCr of 0.65 mg/dL).  Assessment:  52 yr old male with new PE per 11/13 CTA and bilateral DVT per duplex today, and thrombocytpenia. Has IVC filter.  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  Last MRI (11/10) did not show evidence of compressive hematoma.  Hx PE and DVT and was on Eliquis prior to admit.   Dr. Myna HidalgoEnnever and Dr. Wynetta Emeryram discussed risk/benefit and to begin Bivalirudin with low therapeutic goal.  Previously on Argatroban 10/22>10/23 then Bivalirudin 10/23>10/25. Noted switched due to Argatroban possibly contributing to bradycardia and hypotension. Discussed with Dr. Myna HidalgoEnnever, to use Bivalirudin. Prior aPTTs were 60 and 58 seconds on Bivalirudin at 0.15 mg/kg/hr.    2nd weekly Nplate dose given today.  No bleeding reported.  APTT now 56 sec  Goal of Therapy:  aPTT 50-67 seconds (lower end of usual range 50-85 seconds) seconds Monitor platelets by anticoagulation  protocol: Yes   Plan:  Bivalirudin at 0.15 mg/kg/hr CBC daily. APTT at least daily. Monitor for any signs or symptoms of bleeding. Thanks for allowing pharmacy to be a part of this patient's care.  Talbert CageLora Avry Monteleone, PharmD Clinical Pharmacist   07/28/2018 2:54 AM

## 2018-07-28 NOTE — Progress Notes (Signed)
Patient ID: [name] [name], male   DOB: 02-Sep-1966, 52 y.o.   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

## 2018-07-29 LAB — BASIC METABOLIC PANEL
Anion gap: 11 (ref 5–15)
BUN: 52 mg/dL — ABNORMAL HIGH (ref 6–20)
CO2: 25 mmol/L (ref 22–32)
Calcium: 9 mg/dL (ref 8.9–10.3)
Chloride: 103 mmol/L (ref 98–111)
Creatinine, Ser: 0.95 mg/dL (ref 0.61–1.24)
GFR calc Af Amer: >60 mL/min (ref >60)
GFR calc non Af Amer: >60 mL/min (ref >60)
Glucose, Bld: 375 mg/dL — ABNORMAL HIGH (ref 70–99)
Potassium: 3.7 mmol/L (ref 3.5–5.1)
Sodium: 139 mmol/L (ref 135–145)

## 2018-07-29 LAB — CBC
HCT: 29.5 % — ABNORMAL LOW (ref 39.0–52.0)
Hemoglobin: 9.1 g/dL — ABNORMAL LOW (ref 13.0–17.0)
MCH: 30 pg (ref 26.0–34.0)
MCHC: 30.8 g/dL (ref 30.0–36.0)
MCV: 97.4 fL (ref 80.0–100.0)
Platelets: 61 10*3/uL — ABNORMAL LOW (ref 150–400)
RBC: 3.03 MIL/uL — ABNORMAL LOW (ref 4.22–5.81)
RDW: 17.1 % — ABNORMAL HIGH (ref 11.5–15.5)
WBC: 4.8 10*3/uL (ref 4.0–10.5)
nRBC: 1.9 % — ABNORMAL HIGH (ref 0.0–0.2)

## 2018-07-29 LAB — GLUCOSE, CAPILLARY
Glucose-Capillary: 212 mg/dL — ABNORMAL HIGH (ref 70–99)
Glucose-Capillary: 179 mg/dL — ABNORMAL HIGH (ref 70–99)
Glucose-Capillary: 227 mg/dL — ABNORMAL HIGH (ref 70–99)
Glucose-Capillary: 438 mg/dL — ABNORMAL HIGH (ref 70–99)

## 2018-07-29 LAB — ANTIPHOSPHOLIPID SYNDROME EVAL, BLD
Anticardiolipin IgM: <9 MPL U/mL (ref 0–12)
DRVVT: 47.2 s — ABNORMAL HIGH (ref 0.0–47.0)
PHOSPHATYDALSERINE, IGA: 3 {APS'U} (ref 0–20)
PHOSPHATYDALSERINE, IGG: 10 {GPS'U} (ref 0–11)
PHOSPHATYDALSERINE, IGM: 8 {MPS'U} (ref 0–25)
PTT Lupus Anticoagulant: 30.3 s (ref 0.0–51.9)

## 2018-07-29 LAB — PROCALCITONIN: Procalcitonin: 0.7 ng/mL

## 2018-07-29 LAB — APTT: APTT: 56 s — AB (ref 24–36)

## 2018-07-29 LAB — LACTATE DEHYDROGENASE: LDH: 396 U/L — ABNORMAL HIGH (ref 98–192)

## 2018-07-29 LAB — PROTEIN C, TOTAL: Protein C, Total: 144 % (ref 60–150)

## 2018-07-29 LAB — DRVVT MIX: dRVVT Mix: 40.4 s (ref 0.0–47.0)

## 2018-07-29 NOTE — Progress Notes (Signed)
ANTICOAGULATION CONSULT NOTE -  Consult follow-up Note   Pharmacy Consult for Bivaliruidin Indication: pulmonary embolus and bilateral DVT  Allergies  Allergen Reactions  . Nsaids Other (See Comments)    G6PD DEFICIENCY  . Sulfa Antibiotics Other (See Comments)    G6PD DEFICIENCY  . Protonix [Pantoprazole Sodium] Diarrhea    Severe diarrhea, Upset stomach    Patient Measurements: Height: 5\' 9"  (175.3 cm) Weight: 281 lb 4.9 oz (127.6 kg) IBW/kg (Calculated) : 70.7 Bivalirudin Dosing Weight: 127.6 kg  Vital Signs: Temp: 99.3 F (37.4 C) (11/16 0742) Temp Source: Oral (11/16 0742) BP: 113/81 (11/16 0742) Pulse Rate: 88 (11/16 0742)  Labs: Recent Labs    07/27/18 0430  07/28/18 0139 07/28/18 1200 07/28/18 2017 07/29/18 0242  HGB 9.9*  --  9.7*  --   --  9.1*  HCT 31.2*  --  29.8*  --   --  29.5*  PLT 57*  --  58*  --   --  61*  APTT 24   < > 56* 53* 57* 56*  LABPROT 15.6*  --   --   --   --   --   INR 1.25  --   --   --   --   --   CREATININE 0.65  --   --   --   --  0.95   < > = values in this interval not displayed.    Estimated Creatinine Clearance: 120.3 mL/min (by C-G formula based on SCr of 0.95 mg/dL).  Assessment:  52 yr old male with new PE per 11/13 CTA and bilateral DVT per duplex today, and thrombocytpenia. Has IVC filter.  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  Last MRI (11/10) did not show evidence of compressive hematoma.  Hx PE and DVT and was on Eliquis prior to admit.   Dr. Myna HidalgoEnnever and Dr. Wynetta Emeryram discussed risk/benefit and to begin Bivalirudin with low therapeutic goal.  Previously on Argatroban 10/22>10/23 then Bivalirudin 10/23>10/25. Noted switched due to Argatroban possibly contributing to bradycardia and hypotension. Discussed with Dr. Myna HidalgoEnnever, to use Bivalirudin. Prior aPTTs were 60 and 58 seconds on Bivalirudin at 0.15 mg/kg/hr.  APTT remains therapeutic this morning at 56 on Bivalirudin 0.15  mg/kg/hr. Hgb down to 9.1, but platelets up to 61. Per RN, no bleeding reported, and no issues with infusion. RN does share that she noticed a bruise on the patient, but it is small (about the size of a silver dollar) and has been stable since she noticed it yesterday.    2nd weekly Nplate dose given 11/14.  Goal of Therapy:  aPTT 50-67 seconds (lower end of usual range 50-85 seconds) seconds Monitor platelets by anticoagulation protocol: Yes   Plan:  Continue Bivalirudin at 0.15 mg/kg/hr Monitor CBC daily. APTT at least daily. Monitor for any signs or symptoms of bleeding.   Harlow MaresAmy Kourtlynn Trevor, PharmD PGY1 Pharmacy Resident Phone [phone]  07/29/2018   12:12 PM

## 2018-07-29 NOTE — Progress Notes (Signed)
[facility]  Chart reviewed PE, complex history of thrombocytopenia, G6PD, prior clots Now on bivalrudin per hematology  PCCM available PRN  Heber CarolinaBrent Sean Macwilliams, MD [facility] Pager: [phone] Cell: [phone] If no response, call [phone]

## 2018-07-29 NOTE — Progress Notes (Signed)
PROGRESS NOTE    [name] [name]  ZYS:[id number] DOB: [birth year] DOA: 06/06/2018 PCP: Helane Rima, MD    Brief Narrative: Brief Narrative:  52 year old man with past medical history relevant for gout, hypertension anterior cervical corpectomy of C4 on 03/24/2018 with comp gated postoperative course including development of multiple PEs and DVTs started on anticoagulation and subsequently developed hematoma requiring reexploration on 03/28/2018 status post placement of IVC filter who was discharged to skilled nursing facility and had a complicated course including progressive lower extremity weakness, intermittent fevers of unclear etiology and hospitalizations for "pneumonia". Patient was admitted to this hospital is a transfer from [facility] with MRI of C-spine that showed postoperative changes and cord compression with cord edema and concern for ischemia. Patient is status post posterior cervical decompression laminectomy of C3, C4, C5, removal of spinous processes, foraminotomies on 06/14/2018. On 06/22/2018 patient began to develop worsening left sided paresthesias and weakness and a stat MRI showed evidence of postoperative hematoma. Patient was taken to the OR with evacuation of hematoma. Additionally his postoperative course has been complicated by recurrent fevers,bilateral lower extremity DVTs on ultrasound,migratory arthralgias/arthritis that are thought to be rheumatologic in nature as well as significant orthostasis limiting the amount he can work with physical therapy. His arthralgia has resolved.   Noted to have decreased left hand grip on 10/19, MRI showed large epidural hematoma, patient s/p clot evacuation.  He was place back on anticoagulation argatroban on 10-22. He was notice to be more bradycardic with HR in the 40 on 10-23. Cardiology was consulted, and argatroban was transition to bivalirrubin to avoid bradycardia. His HR improved after argatroban was discontinue.  -HIT panel 0.486 (mildly elevated)- SRA (serotonin release assay) negative. Dr Marin Olp has been helping with patient care.   Patient developed weakness of left arm on 10-25. He was take emergently to OR for recurrence of epidural hematoma on 10-25. Anticoagulation was discontinue since 10-25. Dr Saintclair Halsted is recommending for patient to remain off anticoagulation for alt leas 1 week, and if anticoagulation is consider, that will need to be discuss with DR Saintclair Halsted.  Patient the night of 10-28 develops left arm weakness, numbness. Stat MRI was ordered, which confirm recurrent hematoma. Patient was taken to OR for hematoma evacuation 10-28. He has been off anticoagulation since 10-25. For his thrombocytopenia, a Bone marrow biopsy was done. For LE DVT, he has IVC filter. He cannot be on anticoagulation due to recurrent bleeding.  Patient has received multiple platelet transfusions.  Patient underwent CT scan of his chest on 11/13 which showed new pulmonary embolism.  Patient was started on bivalirudin.     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  -MRI done on 11/10 shows improvement in the fluid collection.  Other findings as per neurosurgery. Neurosurgery continues to follow.  Based on last MRI findings there is no plan for any further surgical intervention.  Dexamethasone dose has been slowly tapered down.  Patient continues to have weakness on the left side.   Acute pulmonary embolism/DVT in setting of spine surgery -Status post IVC filter placement on March 28, 2018 -Patient was on apixaban and changed over to IV heparin.  However these agents had to be discontinued due to recurrent episodes of hematoma at the surgical site.  -Vascular US 06/26/2018 showed bilateral lower extremity DVT likely acute -Case was discussed with  vascular surgery, Dr. Trula Slade.  Also discussed with interventional radiology.  Unfortunately patient does not have any good options vis--vis either thrombolysis or mechanical thrombectomy. He was kept off of anticoagulation due to recurrent bleeding.   However CT scan 11/13 showed new pulmonary embolism with evidence for right heart strain.   Clinically patient has been stable without any hemodynamic compromise.  Echocardiogram does not show any acute findings.  Discussions held between hematology as well as neurosurgery.  Patient was started on bivalirudin.  Continue to monitor closely for signs of bleeding.  Platelet counts have been stable.  She denies any shortness of breath.  Thrombocytopenia/coagulopathy -Cardiolipin antibody negative -Lupus anticoagulant, homocysteine level, factor VIII within normal limits -Protein S activity 60 (low) -Protein C total >200; Activity 199 (high)  -HIT panel 0.486 (mildly elevated)- SRA (serotonin release assay) negative -immature platelet fraction WNL -Was given 2u platelets on 10/19 in the OR. And 2 units 10-25 for OR. Platelet transfusion 10/29 Patient transfused platelets and given vitamin K on 10/30. Patient  underwent bone marrow biopsy on 10/30.  Pathology report suggests nonspecific findings.   Hematology suspects that some of his coagulopathy and platelet low platelet counts are likely due to medications.  But no clear culprit has been identified.   Platelet counts noted to be low on 11/13.  Patient was transfused platelets.  She was given Nplate.  Platelet counts are stable. At patient's request I did called [facility] on 11/14 and I spoke to Dr. Dyann Kief who is a hematology/oncology specialist.  See my note from 11/15.  No indication for transfer to [place].  Normocytic anemia Patient did get 1 unit of PRBC earlier during this hospitalization.  Hemoglobin has been stable.  No evidence of overt bleeding.  Orthostatic hypotension with near syncope Thought to be related to hypovolemia and autonomic dysfunction.  Echocardiogram July 2019 showed a normal systolic function, grade 2 diastolic dysfunction.  No significant valve abnormalities noted. Patient was placed on midodrine.  Midodrine dose was decreased due to bradycardia.  Abdominal binder.  Seems to be stable.  Sinus bradycardia Patient initially developed bradycardia on 10/23.  He was asymptomatic.  TSH was 1.24.  Cardiology was consulted at that time.  There was no indication for pacemaker.  His heart rate stabilized and improved.  He again became bradycardic on 11/1.  EKG shows sinus bradycardia.  Telemetry did not show any pauses.  Dose of midodrine was decreased as midodrine can sometimes cause bradycardia.  Autonomic dysfunction is another possible etiology.  Avoid AV nodal blocking agents.  Heart rate is stable.  Polyarthralgia/persistent fever -At the time of admission patient had symptoms in the shoulders and ankles, along with a history of gout and pain  in his knees.  He underwent arthrocentesis however no evidence of infection was found. -He was noted to have elevated inflammatory markers including CCP, CRP.   However ANCA, aldolase, ANA were negative. -Previous rounding physicians have discussed with Dr. Kathlene November, rheumatologist.  Patient will need to follow-up with this provider in the outpatient setting. -Patient was placed on prednisone however currently on dexamethasone (neurosurgery) No further episodes of fever.  Patient was on doxycycline for unclear reason and this was discontinued on 10/31.    Upper and lower extremity edema Has improved significantly with diuresis.  Continue with Lasix once a day for now.  Between 11/5 and 11/14 patient has lost about 7 kg.  Electrolytes.  Gout Stable.  Uloric on hold due to his thrombocytopenia.  Acute renal failure/hypokalemia Renal function is back to baseline.  Remains stable.  Monitor while he is getting diuretics.  Essential hypertension  Amlodipine discontinue due to orthostatic hypotension and edema.  Blood pressure remains stable.  Hyperglycemia secondary to steroid Steroid is being tapered down very gradually.  Patient did have an episode of hypoglycemia yesterday.  We made some adjustments to his sliding scale coverage.  His Lantus dose was decreased.  CBGs noted to be elevated this morning.  Continue to monitor for now.  HbA1c was 5.8.    Adjustment disorder with depression and anxiety Patient noted to have flat affect and depressed mood and so psychiatry was consulted.  They recommended changing Xanax to Klonopin on a scheduled basis.  They also recommended sertraline.  He was changed over from alprazolam to Klonopin however did not start him on sertraline due to his hematological issues.    Abnormal CT findings CT scan raised concern for pneumonia but patient respiratory symptoms.  Patient did not have any respiratory symptoms.  Gallstones were noted as well.  He remains asymptomatic.  Stage II pressure ulcer on the right ischial tuberosity Wound care nurse is following.  Suspected Oropharyngeal candidiasis Patient noted to be on  Diflucan.  Continue for now.   DVT prophylaxis: TED stockings Code Status: Full code Family Communication: Discussed with the patient Disposition Plan: Management as outlined above.  Patient remained stable on bivalirudin.  Monitor closely for signs of bleeding.      Consultants:   Neurosurgery  Hematology  Cardiology    Procedures: On 10/2 1. posterior cervical decompressive laminectomy at C3, C4, C5 with removal of the spinous process and complete laminectomy and foraminotomies of the C3, C4, C5 nerve roots. 2. Posterior cervical fusion with lateral mass screws at C3-C4-C5 and pars screws at C2 lies in the globus ellipseLateral mass screw system 3. Posterior lateral arthrodesis C2-C5 utilizing locally harvested autograft mixed with vivigen  06/22/2018: Procedure reexploration posterior cervical wound for evacuation of epidural hematoma  06/26/2018 DVT ultrasound:Right: Findings consistent with acute deep vein thrombosis involving the right common femoral vein, right femoral vein, right popliteal vein, right posterior tibial vein, and right peroneal vein. Thrombosis extends proximally into external iliac vein.  Proximal iliac vein not visualized. Left: Findings consistent with acute deep vein thrombosis involving the left common femoral vein, left femoral vein, left popliteal vein, left posterior tibial vein, and left peroneal vein. Findings consistent with acute superficial vein thrombosis  involving the left great saphenous vein. Thrombosis extends proximally into external iliac vein. Proximal iliac vein not visualized.  07/01/18 Clot evacuation   RUE doppler 07/02/18    Antimicrobials:  Completed course of Ancef  Subjective: Patient without any new complaints.  Continues to  have weakness on the left side.  Denies any chest pain or shortness of breath  Objective: Vitals:   07/28/18 2032 07/28/18 2300 07/29/18 0300 07/29/18 0742  BP: 125/89 113/80 120/84  113/81  Pulse: 96 (!) 107 95 88  Resp: (!) 22 (!) 26 (!) 23 (!) 23  Temp: 98.7 F (37.1 C) 98.1 F (36.7 C) 98.7 F (37.1 C) 99.3 F (37.4 C)  TempSrc: Oral Oral Oral Oral  SpO2: 97% 93% 95% 97%  Weight:      Height:        Intake/Output Summary (Last 24 hours) at 07/29/2018 0858 Last data filed at 07/29/2018 0300 Gross per 24 hour  Intake -  Output 2700 ml  Net -2700 ml   Filed Weights   07/07/18 1000 07/18/18 0600 07/27/18 1502  Weight: 123.8 kg 135 kg 127.6 kg    Examination:  General exam: Awake alert.  In no distress. Respiratory system: Normal effort at rest.  Clear to auscultation bilaterally Cardiovascular system: S1-S2 is normal regular.  No S3-S4.  No rubs murmurs or bruit Gastrointestinal system: Abdomen soft.  Nontender nondistended With improving edema bilateral upper and lower extremities    Data Reviewed: I have personally reviewed following labs and imaging studies  CBC: Recent Labs  Lab 07/23/18 0455 07/24/18 0440 07/25/18 0508 07/26/18 0600 07/27/18 0430 07/28/18 0139 07/29/18 0242  WBC 2.7* 2.8* 3.5* 4.2 4.4 5.1 4.8  NEUTROABS 2.0 2.1 2.7 2.6  --   --   --   HGB 10.2* 9.9* 10.1* 9.8* 9.9* 9.7* 9.1*  HCT 32.7* 32.6* 32.9* 31.9* 31.2* 29.8* 29.5*  MCV 97.0 97.6 97.1 97.0 97.8 94.6 97.4  PLT 54* 44* 36* 27* 57* 58* 61*   Basic Metabolic Panel: Recent Labs  Lab 07/23/18 0455 07/24/18 0440 07/26/18 0600 07/27/18 0430 07/29/18 0242  NA 143 143 142 142 139  K 3.4* 3.8 3.2* 3.6 3.7  CL 100 102 102 103 103  CO2 33* 35* 34* 28 25  GLUCOSE 205* 256* 164* 280* 375*  BUN 50* 52* 57* 51* 52*  CREATININE 0.83 0.75 0.74 0.65 0.95  CALCIUM 9.0 9.4 9.3 9.4 9.0  MG  --   --   --  2.0  --    GFR: Estimated Creatinine Clearance: 120.3 mL/min (by C-G formula based on SCr of 0.95 mg/dL).  Coagulation Profile: Recent Labs  Lab 07/27/18 0430  INR 1.25   CBG: Recent Labs  Lab 07/28/18 1147 07/28/18 1239 07/28/18 1731 07/28/18 2035  07/29/18 0743  GLUCAP 39* 124* 331* 361* 212*      Radiology Studies: Ct Angio Abd/pel W/ And/or W/o  Result Date: 07/28/2018 CLINICAL DATA:  embolism and thrombosis of inferior vena cavaNew PE. Has IVC filter in place. Is there a clot on the filter? No problems with cough or shortness of breath. He has had no bleeding. Platelet count is 58,000. His white cell count is 5.1. Hemoglobin is 9.7.The LDH is 394. This is stable.His fibrinogen as it was 221. His PTT prior to bivalirudin was 24 seconds. His INR is 1.25. Undefined coagulopathy. unusual was a low fibrinogen level.Pt unable to hold arms above his head. EXAM: CTA ABDOMEN AND PELVIS WITH CONTRAST TECHNIQUE: Multidetector CT imaging of the abdomen and pelvis was performed using the venous protocol during bolus administration of intravenous contrast. Multiplanar reconstructed images and MIPs were obtained and reviewed to evaluate the vascular anatomy. CONTRAST:  118m ISOVUE-370 IOPAMIDOL (ISOVUE-370) INJECTION 76% COMPARISON:  07/15/2018 FINDINGS: VASCULAR Aorta: Normal caliber  aorta without aneurysm, dissection, vasculitis or significant stenosis. Celiac: Patent without evidence of aneurysm, dissection, vasculitis or significant stenosis. SMA: Patent without evidence of aneurysm, dissection, vasculitis or significant stenosis. Renals: Both renal arteries are patent without evidence of aneurysm, dissection, vasculitis, fibromuscular dysplasia or significant stenosis. IMA: Patent without evidence of aneurysm, dissection, vasculitis or significant stenosis. Inflow: Patent.  No significant plaque, aneurysm, or stenosis. Proximal Outflow: Bilateral common femoral and visualized portions of the superficial and profunda femoral arteries are patent without evidence of aneurysm, dissection, vasculitis or significant stenosis. Veins: Hepatic veins patent. Portal vein patent. SMV and splenic vein patent. Infrarenal IVC filter stable in position. There is  low-attenuation centrally below the apex of the filter consistent with some entrapped thrombus but the rest of the IVC enhances normally. There is no evidence of thrombus extension into the cava above the filter. Nonocclusive filling defect at the confluence of the left external and internal iliac veins consistent with DVT. Incompletely occlusive filling defects in bilateral common femoral veins right greater than left extending into the femoral venous system distally, incompletely visualized, consistent with DVT. Review of the MIP images confirms the above findings. NON-VASCULAR Lower chest: Bibasilar consolidation/atelectasis left greater than right as before. No pleural or pericardial effusion. Hepatobiliary: No focal liver abnormality is seen. No gallstones, gallbladder wall thickening, or biliary dilatation. Pancreas: Unremarkable. No pancreatic ductal dilatation or surrounding inflammatory changes. Spleen: Normal in size without focal abnormality. Adrenals/Urinary Tract: Adrenal glands are unremarkable. Kidneys are normal, without renal calculi, focal lesion, or hydronephrosis. Bladder is unremarkable. Stomach/Bowel: Stomach and small bowel decompressed. Appendix not discretely identified. Moderate fecal material in the colon without dilatation or wall thickening. Lymphatic: No abdominal or pelvic adenopathy localized. Reproductive: Prostate is unremarkable. Other: No ascites.  No free air. Musculoskeletal: Spurring in the lower thoracic and lumbar spine with spondylitic changes most marked L3-S1. No fracture or worrisome bone lesion. IMPRESSION: VASCULAR 1. Bilateral lower extremity DVT and nonocclusive left iliac venous DVT. 2. Clot entrapped in the infrarenal IVC filter without caval occlusion or clot extension above the filter. 3. No significant arterial pathology. NON-VASCULAR 1. Persistent airspace opacities in the lung bases left greater than right. 2. No acute findings. Electronically Signed   By: Lucrezia Europe M.D.   On: 07/28/2018 11:39        Scheduled Meds: . clonazePAM  0.5 mg Oral BID  . dexamethasone  4 mg Intravenous Q24H  . feeding supplement (ENSURE ENLIVE)  237 mL Oral QID  . furosemide  40 mg Intravenous Daily  . gabapentin  300 mg Oral TID  . Gerhardt's butt cream   Topical BID  . Influenza vac split quadrivalent PF  0.5 mL Intramuscular Tomorrow-1000  . insulin aspart  0-9 Units Subcutaneous TID WC  . insulin glargine  30 Units Subcutaneous QHS  . magic mouthwash w/lidocaine  5 mL Oral TID  . methocarbamol  750 mg Oral QID  . midodrine  5 mg Oral BID WC  . multivitamin with minerals  1 tablet Oral Daily  . nystatin  5 mL Oral TID AC & HS  . romiPLOStim  250 mcg Subcutaneous Q Thu  . sodium chloride flush  10-40 mL Intracatheter Q12H   Continuous Infusions: . sodium chloride 10 mL/hr at 07/25/18 2300  . bivalirudin (ANGIOMAX) infusion 0.5 mg/mL (Non-ACS indications) 0.15 mg/kg/hr (07/28/18 1915)     LOS: 31 days     Bonnielee Haff, MD [facility] Pager [phone]  If 7PM-7AM, please contact night-coverage  [email or web address] Password [email or web address] 07/29/2018, 8:58 AM

## 2018-07-29 NOTE — Progress Notes (Signed)
Neurosurgery Service Progress Note  Subjective: No acute events overnight, feels that his strength is the same, no   Objective: Vitals:   07/28/18 2032 07/28/18 2300 07/29/18 0300 07/29/18 0742  BP: 125/89 113/80 120/84 113/81  Pulse: 96 (!) 107 95 88  Resp: (!) 22 (!) 26 (!) 23 (!) 23  Temp: 98.7 F (37.1 C) 98.1 F (36.7 C) 98.7 F (37.1 C) 99.3 F (37.4 C)  TempSrc: Oral Oral Oral Oral  SpO2: 97% 93% 95% 97%  Weight:      Height:       Temp (24hrs), Avg:98.5 F (36.9 C), Min:97.5 F (36.4 C), Max:99.3 F (37.4 C)  CBC Latest Ref Rng & Units 07/29/2018 07/28/2018 07/27/2018  WBC 4.0 - 10.5 K/uL 4.8 5.1 4.4  Hemoglobin 13.0 - 17.0 g/dL 1.6(X) 0.9(U) 0.4(V)  Hematocrit 39.0 - 52.0 % 29.5(L) 29.8(L) 31.2(L)  Platelets 150 - 400 K/uL 61(L) 58(L) 57(L)   BMP Latest Ref Rng & Units 07/29/2018 07/27/2018 07/26/2018  Glucose 70 - 99 mg/dL 409(W) 119(J) 478(G)  BUN 6 - 20 mg/dL 95(A) 21(H) 08(M)  Creatinine 0.61 - 1.24 mg/dL 5.78 4.69 6.29  Sodium 135 - 145 mmol/L 139 142 142  Potassium 3.5 - 5.1 mmol/L 3.7 3.6 3.2(L)  Chloride 98 - 111 mmol/L 103 103 102  CO2 22 - 32 mmol/L 25 28 34(H)  Calcium 8.9 - 10.3 mg/dL 9.0 9.4 9.3    Intake/Output Summary (Last 24 hours) at 07/29/2018 1140 Last data filed at 07/29/2018 0300 Gross per 24 hour  Intake -  Output 2700 ml  Net -2700 ml    Current Facility-Administered Medications:  .  0.9 %  sodium chloride infusion, , Intravenous, PRN, Osvaldo Shipper, MD, Last Rate: 10 mL/hr at 07/25/18 2300 .  acetaminophen (TYLENOL) tablet 650 mg, 650 mg, Oral, Q4H PRN, 650 mg at 06/26/18 2204 **OR** acetaminophen (TYLENOL) suppository 650 mg, 650 mg, Rectal, Q4H PRN, Mikhail, Nita Sells, DO .  alum & mag hydroxide-simeth (MAALOX/MYLANTA) 200-200-20 MG/5ML suspension 30 mL, 30 mL, Oral, Q6H PRN, Edsel Petrin, DO, 30 mL at 06/26/18 2203 .  benzonatate (TESSALON) capsule 100 mg, 100 mg, Oral, TID PRN, Edsel Petrin, DO, 100 mg at 07/26/18  1308 .  bisacodyl (DULCOLAX) EC tablet 5 mg, 5 mg, Oral, Daily PRN, Edsel Petrin, DO, 5 mg at 07/02/18 2120 .  bivalirudin (ANGIOMAX) 250 mg in sodium chloride 0.9 % 500 mL (0.5 mg/mL) infusion, 0.15 mg/kg/hr, Intravenous, Continuous, Scarlett Presto, [facility], Last Rate: 38.3 mL/hr at 07/28/18 1915, 0.15 mg/kg/hr at 07/28/18 1915 .  clonazePAM (KLONOPIN) tablet 0.5 mg, 0.5 mg, Oral, BID, Osvaldo Shipper, MD, 0.5 mg at 07/29/18 0904 .  dexamethasone (DECADRON) injection 4 mg, 4 mg, Intravenous, Q24H, Ennever, Rose Phi, MD, 4 mg at 07/29/18 0903 .  feeding supplement (ENSURE ENLIVE) (ENSURE ENLIVE) liquid 237 mL, 237 mL, Oral, QID, Osvaldo Shipper, MD, 237 mL at 07/29/18 0905 .  furosemide (LASIX) injection 40 mg, 40 mg, Intravenous, Daily, Kathlen Mody, MD, 40 mg at 07/29/18 0903 .  gabapentin (NEURONTIN) capsule 300 mg, 300 mg, Oral, TID, Mikhail, Maryann, DO, 300 mg at 07/29/18 0910 .  Gerhardt's butt cream, , Topical, BID, Donalee Citrin, MD .  HYDROcodone-homatropine [facility]) 5-1.5 MG/5ML syrup 5 mL, 5 mL, Oral, Q4H PRN, Edsel Petrin, DO, 5 mL at 07/28/18 2237 .  Influenza vac split quadrivalent PF (FLUARIX) injection 0.5 mL, 0.5 mL, Intramuscular, Tomorrow-1000, Mikhail, Maryann, DO .  insulin aspart (novoLOG) injection 0-9 Units, 0-9 Units, Subcutaneous, TID WC,  Osvaldo ShipperKrishnan, Gokul, MD, 3 Units at 07/29/18 0900 .  insulin glargine (LANTUS) injection 30 Units, 30 Units, Subcutaneous, QHS, Osvaldo ShipperKrishnan, Gokul, MD, 30 Units at 07/28/18 2236 .  loperamide (IMODIUM) capsule 2 mg, 2 mg, Oral, TID PRN, Osvaldo ShipperKrishnan, Gokul, MD, 2 mg at 07/28/18 2237 .  magic mouthwash w/lidocaine, 5 mL, Oral, TID, Meyran, Tiana LoftKimberly Hannah, NP, 5 mL at 07/29/18 0903 .  methocarbamol (ROBAXIN) tablet 750 mg, 750 mg, Oral, QID, Mikhail, RodantheMaryann, DO, 750 mg at 07/29/18 [id number] .  midodrine (PROAMATINE) tablet 5 mg, 5 mg, Oral, BID WC, Osvaldo ShipperKrishnan, Gokul, MD, 5 mg at 07/29/18 0900 .  morphine 2 MG/ML injection 2 mg, 2 mg, Intravenous, Q4H  PRN, Edsel PetrinMikhail, Maryann, DO, 2 mg at 07/21/18 2245 .  multivitamin with minerals tablet 1 tablet, 1 tablet, Oral, Daily, Osvaldo ShipperKrishnan, Gokul, MD, 1 tablet at 07/29/18 0904 .  nystatin (MYCOSTATIN) 100000 UNIT/ML suspension 500,000 Units, 5 mL, Oral, TID AC & HS, Mikhail, Maryann, DO, 500,000 Units at 07/29/18 0900 .  ondansetron (ZOFRAN) tablet 4 mg, 4 mg, Oral, Q6H PRN **OR** ondansetron (ZOFRAN) injection 4 mg, 4 mg, Intravenous, Q6H PRN, Mikhail, Maryann, DO .  oxyCODONE (Oxy IR/ROXICODONE) immediate release tablet 15 mg, 15 mg, Oral, Q3H PRN, Edsel PetrinMikhail, Maryann, DO, 15 mg at 07/23/18 0701 .  romiPLOStim (NPLATE) injection 250 mcg, 250 mcg, Subcutaneous, Q Jinny Blossomhu, Ennever, Peter R, MD, 250 mcg at 07/27/18 1307 .  sodium chloride flush (NS) 0.9 % injection 10-40 mL, 10-40 mL, Intracatheter, Q12H, Regalado, Belkys A, MD, 10 mL at 07/29/18 0905 .  sodium chloride flush (NS) 0.9 % injection 10-40 mL, 10-40 mL, Intracatheter, PRN, Regalado, Belkys A, MD .  sodium chloride flush (NS) 0.9 % injection 3 mL, 3 mL, Intravenous, PRN, Edsel PetrinMikhail, Maryann, DO, 3 mL at 07/20/18 1010   Physical Exam: Stable quadriparesis, better in the RUE than LUE  Assessment & Plan: 52 y.o. man s/p complicated spine surgery history with PE on bival gtt. -no change in neurosurgical plan  Jadene Pierinihomas A Kemora Pinard  07/29/18 11:40 AM

## 2018-07-30 LAB — CBC
HCT: 29.9 % — ABNORMAL LOW (ref 39.0–52.0)
HEMOGLOBIN: 9.2 g/dL — AB (ref 13.0–17.0)
MCH: 30.4 pg (ref 26.0–34.0)
MCHC: 30.8 g/dL (ref 30.0–36.0)
MCV: 98.7 fL (ref 80.0–100.0)
NRBC: 4.1 % — AB (ref 0.0–0.2)
PLATELETS: 63 10*3/uL — AB (ref 150–400)
RBC: 3.03 MIL/uL — AB (ref 4.22–5.81)
RDW: 16.8 % — ABNORMAL HIGH (ref 11.5–15.5)
WBC: 4.4 10*3/uL (ref 4.0–10.5)

## 2018-07-30 LAB — GLUCOSE, CAPILLARY
GLUCOSE-CAPILLARY: 333 mg/dL — AB (ref 70–99)
GLUCOSE-CAPILLARY: 318 mg/dL — AB (ref 70–99)
GLUCOSE-CAPILLARY: 266 mg/dL — AB (ref 70–99)
Glucose-Capillary: 345 mg/dL — ABNORMAL HIGH (ref 70–99)

## 2018-07-30 LAB — APTT: APTT: 50 s — AB (ref 24–36)

## 2018-07-30 LAB — LACTATE DEHYDROGENASE: LDH: 391 U/L — AB (ref 98–192)

## 2018-07-30 MED ORDER — INSULIN ASPART 100 UNIT/ML ~~LOC~~ SOLN
0.0000 [IU] | Freq: Three times a day (TID) | SUBCUTANEOUS | Status: DC
  Administered 2018-07-30: 11 [IU] via SUBCUTANEOUS
  Administered 2018-07-31: 5 [IU] via SUBCUTANEOUS
  Administered 2018-07-31: 11 [IU] via SUBCUTANEOUS
  Administered 2018-07-31: 5 [IU] via SUBCUTANEOUS
  Administered 2018-08-01: 3 [IU] via SUBCUTANEOUS
  Administered 2018-08-01: 11 [IU] via SUBCUTANEOUS
  Administered 2018-08-01: 8 [IU] via SUBCUTANEOUS
  Administered 2018-08-02: 3 [IU] via SUBCUTANEOUS
  Administered 2018-08-02: 11 [IU] via SUBCUTANEOUS
  Administered 2018-08-02: 15 [IU] via SUBCUTANEOUS
  Administered 2018-08-03 – 2018-08-04 (×3): 3 [IU] via SUBCUTANEOUS
  Administered 2018-08-05: 5 [IU] via SUBCUTANEOUS
  Administered 2018-08-05 (×2): 3 [IU] via SUBCUTANEOUS
  Administered 2018-08-06 (×2): 2 [IU] via SUBCUTANEOUS
  Administered 2018-08-08: 3 [IU] via SUBCUTANEOUS
  Administered 2018-08-09 – 2018-08-17 (×5): 2 [IU] via SUBCUTANEOUS
  Administered 2018-08-21: 3 [IU] via SUBCUTANEOUS
  Administered 2018-08-21: 5 [IU] via SUBCUTANEOUS
  Administered 2018-08-21 – 2018-08-25 (×5): 3 [IU] via SUBCUTANEOUS
  Administered 2018-08-27: 2 [IU] via SUBCUTANEOUS

## 2018-07-30 MED ORDER — INSULIN ASPART 100 UNIT/ML ~~LOC~~ SOLN
0.0000 [IU] | Freq: Every day | SUBCUTANEOUS | Status: DC
  Administered 2018-07-30: 3 [IU] via SUBCUTANEOUS
  Administered 2018-07-31 – 2018-08-01 (×2): 5 [IU] via SUBCUTANEOUS
  Administered 2018-08-02: 3 [IU] via SUBCUTANEOUS
  Administered 2018-08-21 – 2018-08-22 (×2): 2 [IU] via SUBCUTANEOUS

## 2018-07-30 MED ORDER — INSULIN GLARGINE 100 UNIT/ML ~~LOC~~ SOLN
35.0000 [IU] | Freq: Every day | SUBCUTANEOUS | Status: DC
  Administered 2018-07-30 – 2018-08-07 (×8): 35 [IU] via SUBCUTANEOUS
  Filled 2018-07-30 (×9): qty 0.35

## 2018-07-30 NOTE — Progress Notes (Signed)
PROGRESS NOTE    [name] [name]  ZYS:[id number] DOB: [birth year] DOA: 06/06/2018 PCP: Helane Rima, MD    Brief Narrative: Brief Narrative:  52 year old man with past medical history relevant for gout, hypertension anterior cervical corpectomy of C4 on 03/24/2018 with comp gated postoperative course including development of multiple PEs and DVTs started on anticoagulation and subsequently developed hematoma requiring reexploration on 03/28/2018 status post placement of IVC filter who was discharged to skilled nursing facility and had a complicated course including progressive lower extremity weakness, intermittent fevers of unclear etiology and hospitalizations for "pneumonia". Patient was admitted to this hospital is a transfer from [facility] with MRI of C-spine that showed postoperative changes and cord compression with cord edema and concern for ischemia. Patient is status post posterior cervical decompression laminectomy of C3, C4, C5, removal of spinous processes, foraminotomies on 06/14/2018. On 06/22/2018 patient began to develop worsening left sided paresthesias and weakness and a stat MRI showed evidence of postoperative hematoma. Patient was taken to the OR with evacuation of hematoma. Additionally his postoperative course has been complicated by recurrent fevers,bilateral lower extremity DVTs on ultrasound,migratory arthralgias/arthritis that are thought to be rheumatologic in nature as well as significant orthostasis limiting the amount he can work with physical therapy. His arthralgia has resolved.   Noted to have decreased left hand grip on 10/19, MRI showed large epidural hematoma, patient s/p clot evacuation.  He was place back on anticoagulation argatroban on 10-22. He was notice to be more bradycardic with HR in the 40 on 10-23. Cardiology was consulted, and argatroban was transition to bivalirrubin to avoid bradycardia. His HR improved after argatroban was discontinue.  -HIT panel 0.486 (mildly elevated)- SRA (serotonin release assay) negative. Dr Marin Olp has been helping with patient care.   Patient developed weakness of left arm on 10-25. He was take emergently to OR for recurrence of epidural hematoma on 10-25. Anticoagulation was discontinue since 10-25. Dr Saintclair Halsted is recommending for patient to remain off anticoagulation for alt leas 1 week, and if anticoagulation is consider, that will need to be discuss with DR Saintclair Halsted.  Patient the night of 10-28 develops left arm weakness, numbness. Stat MRI was ordered, which confirm recurrent hematoma. Patient was taken to OR for hematoma evacuation 10-28. He has been off anticoagulation since 10-25. For his thrombocytopenia, a Bone marrow biopsy was done. For LE DVT, he has IVC filter. He cannot be on anticoagulation due to recurrent bleeding.  Patient has received multiple platelet transfusions.  Patient underwent CT scan of his chest on 11/13 which showed new pulmonary embolism.  Patient was started on bivalirudin.     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  -MRI done on 11/10 shows improvement in the fluid collection.  Other findings as per neurosurgery. Neurosurgery continues to follow.  Based on last MRI findings there is no plan for any further surgical intervention.  Dexamethasone dose has been slowly tapered down.  Patient continues to have left-sided weakness.  No changes compared to yesterday.  Acute pulmonary embolism/DVT in setting of spine surgery -Status post IVC filter placement on March 28, 2018 -Patient was on apixaban and changed over to IV heparin.  However these agents had to be discontinued due to recurrent episodes of hematoma at the surgical site.  -Vascular US 06/26/2018 showed bilateral lower extremity DVT likely acute -Case was discussed with  vascular surgery, Dr. Trula Slade.  Also discussed with interventional radiology.  Unfortunately patient does not have any good options vis--vis either thrombolysis or mechanical thrombectomy. He was kept off of anticoagulation due to recurrent bleeding.   However CT scan 11/13 showed new pulmonary embolism with evidence for right heart strain.   Clinically patient has been stable without any hemodynamic compromise.  Echocardiogram does not show any acute findings.  Discussions held between hematology as well as neurosurgery.  Patient was started on bivalirudin.   Patient seems to be tolerating anticoagulation well without any signs of bleeding.  Platelet counts have been stable.    Thrombocytopenia/coagulopathy -Cardiolipin antibody negative -Lupus anticoagulant, homocysteine level, factor VIII within normal limits -Protein S activity 60 (low) -Protein C total >200; Activity 199 (high)  -HIT panel 0.486 (mildly elevated)- SRA (serotonin release assay) negative -immature platelet fraction WNL -Was given 2u platelets on 10/19 in the OR. And 2 units 10-25 for OR. Platelet transfusion 10/29 Patient transfused platelets and given  vitamin K on 10/30. Patient underwent bone marrow biopsy on 10/30.  Pathology report suggests nonspecific findings.   Hematology suspects that some of his coagulopathy and platelet low platelet counts are likely due to medications.  But no clear culprit has been identified.   Platelet counts noted to be low on 11/13.  Patient was transfused platelets.  At patient's request I did called [facility] on 11/14 and I spoke to Dr. Dyann Kief who is a hematology/oncology specialist.  See my note from 11/15.  No indication for transfer to [place]. He is on Nplate.  Platelet counts are stable.  Hyperglycemia secondary to steroid Steroid is being tapered down gradually.  Patient did have an episode of hypoglycemia 2 days ago.  His dose of Lantus was reduced.  Sliding scale coverage was also changed.  CBGs have stabilized and now again in the hyperglycemic range.  Could consider increasing his Lantus by 4 units tonight.  HbA1c was 5.8.   Normocytic anemia Patient did get 1 unit of PRBC earlier during this hospitalization.  Hemoglobin has been stable.  No evidence of overt bleeding.  Orthostatic hypotension with near syncope Thought to be related to hypovolemia and autonomic dysfunction.  Echocardiogram July 2019 showed a normal systolic function, grade 2 diastolic dysfunction.  No significant valve abnormalities noted. Patient was placed on midodrine.  Midodrine dose was decreased due to bradycardia.  Abdominal binder.  Seems to be stable.  Sinus bradycardia Patient initially developed bradycardia on 10/23.  He was asymptomatic.  TSH was 1.24.  Cardiology was consulted at that time.  There was no indication for pacemaker.  His heart rate stabilized and improved.  He again became bradycardic on 11/1.  EKG  shows sinus bradycardia.  Telemetry did not show any pauses.  Dose of midodrine was decreased as midodrine can sometimes cause bradycardia.  Autonomic dysfunction is another possible  etiology.  Avoid AV nodal blocking agents.  Heart rate is stable.  Polyarthralgia/persistent fever -At the time of admission patient had symptoms in the shoulders and ankles, along with a history of gout and pain in his knees.  He underwent arthrocentesis however no evidence of infection was found. -He was noted to have elevated inflammatory markers including CCP, CRP.  However ANCA, aldolase, ANA were negative. -Previous rounding physicians have discussed with Dr. Kathlene November, rheumatologist.  Patient will need to follow-up with this provider in the outpatient setting. -Patient was placed on prednisone however currently on dexamethasone (neurosurgery) No further episodes of fever.  Patient was on doxycycline for unclear reason and this was discontinued on 10/31.    Upper and lower extremity edema Has improved significantly with diuresis.  Continue with Lasix once a day for now.  Between 11/5 and 11/14 patient has lost about 7 kg.  Electrolytes.  Gout Stable.  Uloric on hold due to his thrombocytopenia.  Acute renal failure/hypokalemia Renal function is back to baseline.  Remains stable.  Monitor while he is getting diuretics.  Essential hypertension  Amlodipine discontinue due to orthostatic hypotension and edema.  Blood pressure remains stable.  Adjustment disorder with depression and anxiety Patient noted to have flat affect and depressed mood and so psychiatry was consulted.  They recommended changing Xanax to Klonopin on a scheduled basis.  They also recommended sertraline.  He was changed over from alprazolam to Klonopin however did not start him on sertraline due to his hematological issues.    Abnormal CT findings CT scan raised concern for pneumonia but patient respiratory symptoms.  Patient did not have any respiratory symptoms.  Gallstones were noted as well.  He remains asymptomatic.  Stage II pressure ulcer on the right ischial tuberosity Wound care nurse is following.  Suspected  Oropharyngeal candidiasis Patient was given a course of fluconazole.  DVT prophylaxis: TED stockings Code Status: Full code Family Communication: Discussed with the patient Disposition Plan: Management as outlined above.  Patient remains stable on anticoagulation with no evidence of bleeding and stable platelet counts.      Consultants:   Neurosurgery  Hematology  Cardiology    Procedures: On 10/2 1. posterior cervical decompressive laminectomy at C3, C4, C5 with removal of the spinous process and complete laminectomy and foraminotomies of the C3, C4, C5 nerve roots. 2. Posterior cervical fusion with lateral mass screws at C3-C4-C5 and pars screws at C2 lies in the globus ellipseLateral mass screw system 3. Posterior lateral arthrodesis C2-C5 utilizing locally harvested autograft mixed with vivigen  06/22/2018: Procedure reexploration posterior cervical wound for evacuation of epidural hematoma  06/26/2018 DVT ultrasound:Right: Findings consistent with acute deep vein thrombosis involving the right common femoral vein, right femoral vein, right popliteal vein, right posterior tibial vein, and right peroneal vein. Thrombosis extends proximally into external iliac vein.  Proximal iliac vein not visualized. Left: Findings consistent with acute deep vein thrombosis involving the left common femoral vein, left femoral vein, left popliteal vein, left posterior tibial vein, and left peroneal vein. Findings consistent with acute superficial vein thrombosis  involving the left great saphenous vein. Thrombosis extends proximally into external iliac vein. Proximal iliac vein not visualized.  07/01/18 Clot evacuation   RUE doppler 07/02/18    Antimicrobials:  Completed course of Ancef  Subjective: Patient without any  complaints.  Seems to be in better spirits this morning.  Continues to have weakness on the left side.  Objective: Vitals:   07/29/18 1552 07/29/18 2300  07/30/18 0300 07/30/18 0730  BP: 104/77 121/87 124/90 (!) 148/86  Pulse: 91 (!) 101 96 97  Resp: (!) 25 (!) 28 17 (!) 23  Temp: 98.2 F (36.8 C) 98.1 F (36.7 C) 98.9 F (37.2 C) 98.8 F (37.1 C)  TempSrc: Oral Oral Oral Oral  SpO2: 96% 96% 95% 95%  Weight:      Height:        Intake/Output Summary (Last 24 hours) at 07/30/2018 0935 Last data filed at 07/30/2018 0300 Gross per 24 hour  Intake -  Output 2150 ml  Net -2150 ml   Filed Weights   07/07/18 1000 07/18/18 0600 07/27/18 1502  Weight: 123.8 kg 135 kg 127.6 kg    Examination:  General exam: Awake alert.  In no distress Respiratory system: Normal effort at rest.  Clear to auscultation bilaterally Cardiovascular system: S1-S2 is normal regular.  No S3-S4.  No rubs murmurs or bruit Gastrointestinal system: Abdomen is soft.  Nontender nondistended. Improving edema in bilateral upper and lower extremities    Data Reviewed: I have personally reviewed following labs and imaging studies  CBC: Recent Labs  Lab 07/24/18 0440 07/25/18 0508 07/26/18 0600 07/27/18 0430 07/28/18 0139 07/29/18 0242 07/30/18 0719  WBC 2.8* 3.5* 4.2 4.4 5.1 4.8 4.4  NEUTROABS 2.1 2.7 2.6  --   --   --   --   HGB 9.9* 10.1* 9.8* 9.9* 9.7* 9.1* 9.2*  HCT 32.6* 32.9* 31.9* 31.2* 29.8* 29.5* 29.9*  MCV 97.6 97.1 97.0 97.8 94.6 97.4 98.7  PLT 44* 36* 27* 57* 58* 61* 63*   Basic Metabolic Panel: Recent Labs  Lab 07/24/18 0440 07/26/18 0600 07/27/18 0430 07/29/18 0242  NA 143 142 142 139  K 3.8 3.2* 3.6 3.7  CL 102 102 103 103  CO2 35* 34* 28 25  GLUCOSE 256* 164* 280* 375*  BUN 52* 57* 51* 52*  CREATININE 0.75 0.74 0.65 0.95  CALCIUM 9.4 9.3 9.4 9.0  MG  --   --  2.0  --    GFR: Estimated Creatinine Clearance: 120.3 mL/min (by C-G formula based on SCr of 0.95 mg/dL).  Coagulation Profile: Recent Labs  Lab 07/27/18 0430  INR 1.25   CBG: Recent Labs  Lab 07/29/18 0743 07/29/18 1213 07/29/18 1555 07/29/18 2023  07/30/18 0738  GLUCAP 212* 179* 227* 438* 333*      Radiology Studies: Ct Angio Abd/pel W/ And/or W/o  Result Date: 07/28/2018 CLINICAL DATA:  embolism and thrombosis of inferior vena cavaNew PE. Has IVC filter in place. Is there a clot on the filter? No problems with cough or shortness of breath. He has had no bleeding. Platelet count is 58,000. His white cell count is 5.1. Hemoglobin is 9.7.The LDH is 394. This is stable.His fibrinogen as it was 221. His PTT prior to bivalirudin was 24 seconds. His INR is 1.25. Undefined coagulopathy. unusual was a low fibrinogen level.Pt unable to hold arms above his head. EXAM: CTA ABDOMEN AND PELVIS WITH CONTRAST TECHNIQUE: Multidetector CT imaging of the abdomen and pelvis was performed using the venous protocol during bolus administration of intravenous contrast. Multiplanar reconstructed images and MIPs were obtained and reviewed to evaluate the vascular anatomy. CONTRAST:  187m ISOVUE-370 IOPAMIDOL (ISOVUE-370) INJECTION 76% COMPARISON:  07/15/2018 FINDINGS: VASCULAR Aorta: Normal caliber aorta without aneurysm, dissection, vasculitis or  significant stenosis. Celiac: Patent without evidence of aneurysm, dissection, vasculitis or significant stenosis. SMA: Patent without evidence of aneurysm, dissection, vasculitis or significant stenosis. Renals: Both renal arteries are patent without evidence of aneurysm, dissection, vasculitis, fibromuscular dysplasia or significant stenosis. IMA: Patent without evidence of aneurysm, dissection, vasculitis or significant stenosis. Inflow: Patent.  No significant plaque, aneurysm, or stenosis. Proximal Outflow: Bilateral common femoral and visualized portions of the superficial and profunda femoral arteries are patent without evidence of aneurysm, dissection, vasculitis or significant stenosis. Veins: Hepatic veins patent. Portal vein patent. SMV and splenic vein patent. Infrarenal IVC filter stable in position. There is  low-attenuation centrally below the apex of the filter consistent with some entrapped thrombus but the rest of the IVC enhances normally. There is no evidence of thrombus extension into the cava above the filter. Nonocclusive filling defect at the confluence of the left external and internal iliac veins consistent with DVT. Incompletely occlusive filling defects in bilateral common femoral veins right greater than left extending into the femoral venous system distally, incompletely visualized, consistent with DVT. Review of the MIP images confirms the above findings. NON-VASCULAR Lower chest: Bibasilar consolidation/atelectasis left greater than right as before. No pleural or pericardial effusion. Hepatobiliary: No focal liver abnormality is seen. No gallstones, gallbladder wall thickening, or biliary dilatation. Pancreas: Unremarkable. No pancreatic ductal dilatation or surrounding inflammatory changes. Spleen: Normal in size without focal abnormality. Adrenals/Urinary Tract: Adrenal glands are unremarkable. Kidneys are normal, without renal calculi, focal lesion, or hydronephrosis. Bladder is unremarkable. Stomach/Bowel: Stomach and small bowel decompressed. Appendix not discretely identified. Moderate fecal material in the colon without dilatation or wall thickening. Lymphatic: No abdominal or pelvic adenopathy localized. Reproductive: Prostate is unremarkable. Other: No ascites.  No free air. Musculoskeletal: Spurring in the lower thoracic and lumbar spine with spondylitic changes most marked L3-S1. No fracture or worrisome bone lesion. IMPRESSION: VASCULAR 1. Bilateral lower extremity DVT and nonocclusive left iliac venous DVT. 2. Clot entrapped in the infrarenal IVC filter without caval occlusion or clot extension above the filter. 3. No significant arterial pathology. NON-VASCULAR 1. Persistent airspace opacities in the lung bases left greater than right. 2. No acute findings. Electronically Signed   By: Lucrezia Europe M.D.   On: 07/28/2018 11:39        Scheduled Meds: . clonazePAM  0.5 mg Oral BID  . dexamethasone  4 mg Intravenous Q24H  . feeding supplement (ENSURE ENLIVE)  237 mL Oral QID  . furosemide  40 mg Intravenous Daily  . gabapentin  300 mg Oral TID  . Gerhardt's butt cream   Topical BID  . Influenza vac split quadrivalent PF  0.5 mL Intramuscular Tomorrow-1000  . insulin aspart  0-9 Units Subcutaneous TID WC  . insulin glargine  30 Units Subcutaneous QHS  . magic mouthwash w/lidocaine  5 mL Oral TID  . methocarbamol  750 mg Oral QID  . midodrine  5 mg Oral BID WC  . multivitamin with minerals  1 tablet Oral Daily  . nystatin  5 mL Oral TID AC & HS  . romiPLOStim  250 mcg Subcutaneous Q Thu  . sodium chloride flush  10-40 mL Intracatheter Q12H   Continuous Infusions: . sodium chloride 10 mL/hr at 07/25/18 2300  . bivalirudin (ANGIOMAX) infusion 0.5 mg/mL (Non-ACS indications) 0.15 mg/kg/hr (07/30/18 0204)     LOS: 39 days     Bonnielee Haff, MD [facility] Pager [phone]  If 7PM-7AM, please contact night-coverage [email or web address] Password [email or web address] 07/30/2018, 9:35 AM

## 2018-07-30 NOTE — Progress Notes (Signed)
Neurosurgery Service Progress Note  Subjective: No acute events overnight, brighter this morning, sitting up in bed and smiling, we compared watches for a while, best spirits I've seen him in  Objective: Vitals:   07/29/18 1552 07/29/18 2300 07/30/18 0300 07/30/18 0730  BP: 104/77 121/87 124/90 (!) 148/86  Pulse: 91 (!) 101 96 97  Resp: (!) 25 (!) 28 17 (!) 23  Temp: 98.2 F (36.8 C) 98.1 F (36.7 C) 98.9 F (37.2 C) 98.8 F (37.1 C)  TempSrc: Oral Oral Oral Oral  SpO2: 96% 96% 95% 95%  Weight:      Height:       Temp (24hrs), Avg:98.4 F (36.9 C), Min:98.1 F (36.7 C), Max:98.9 F (37.2 C)  CBC Latest Ref Rng & Units 07/30/2018 07/29/2018 07/28/2018  WBC 4.0 - 10.5 K/uL 4.4 4.8 5.1  Hemoglobin 13.0 - 17.0 g/dL 0.9(W) 1.1(B) 1.4(N)  Hematocrit 39.0 - 52.0 % 29.9(L) 29.5(L) 29.8(L)  Platelets 150 - 400 K/uL 63(L) 61(L) 58(L)   BMP Latest Ref Rng & Units 07/29/2018 07/27/2018 07/26/2018  Glucose 70 - 99 mg/dL 829(F) 621(H) 086(V)  BUN 6 - 20 mg/dL 78(I) 69(G) 29(B)  Creatinine 0.61 - 1.24 mg/dL 2.84 1.32 4.40  Sodium 135 - 145 mmol/L 139 142 142  Potassium 3.5 - 5.1 mmol/L 3.7 3.6 3.2(L)  Chloride 98 - 111 mmol/L 103 103 102  CO2 22 - 32 mmol/L 25 28 34(H)  Calcium 8.9 - 10.3 mg/dL 9.0 9.4 9.3    Intake/Output Summary (Last 24 hours) at 07/30/2018 1027 Last data filed at 07/30/2018 0300 Gross per 24 hour  Intake -  Output 2150 ml  Net -2150 ml    Current Facility-Administered Medications:  .  0.9 %  sodium chloride infusion, , Intravenous, PRN, Osvaldo Shipper, MD, Last Rate: 10 mL/hr at 07/25/18 2300 .  acetaminophen (TYLENOL) tablet 650 mg, 650 mg, Oral, Q4H PRN, 650 mg at 07/29/18 2211 **OR** acetaminophen (TYLENOL) suppository 650 mg, 650 mg, Rectal, Q4H PRN, Mikhail, Nita Sells, DO .  alum & mag hydroxide-simeth (MAALOX/MYLANTA) 200-200-20 MG/5ML suspension 30 mL, 30 mL, Oral, Q6H PRN, Edsel Petrin, DO, 30 mL at 06/26/18 2203 .  benzonatate (TESSALON)  capsule 100 mg, 100 mg, Oral, TID PRN, Edsel Petrin, DO, 100 mg at 07/26/18 1308 .  bisacodyl (DULCOLAX) EC tablet 5 mg, 5 mg, Oral, Daily PRN, Edsel Petrin, DO, 5 mg at 07/02/18 2120 .  bivalirudin (ANGIOMAX) 250 mg in sodium chloride 0.9 % 500 mL (0.5 mg/mL) infusion, 0.15 mg/kg/hr, Intravenous, Continuous, Scarlett Presto, [facility], Last Rate: 38.3 mL/hr at 07/30/18 0204, 0.15 mg/kg/hr at 07/30/18 0204 .  clonazePAM (KLONOPIN) tablet 0.5 mg, 0.5 mg, Oral, BID, Osvaldo Shipper, MD, 0.5 mg at 07/30/18 0900 .  dexamethasone (DECADRON) injection 4 mg, 4 mg, Intravenous, Q24H, Ennever, Rose Phi, MD, 4 mg at 07/30/18 0857 .  feeding supplement (ENSURE ENLIVE) (ENSURE ENLIVE) liquid 237 mL, 237 mL, Oral, QID, Osvaldo Shipper, MD, 237 mL at 07/30/18 0900 .  furosemide (LASIX) injection 40 mg, 40 mg, Intravenous, Daily, Kathlen Mody, MD, 40 mg at 07/30/18 0857 .  gabapentin (NEURONTIN) capsule 300 mg, 300 mg, Oral, TID, Mikhail, Maryann, DO, 300 mg at 07/30/18 0859 .  Gerhardt's butt cream, , Topical, BID, Donalee Citrin, MD .  HYDROcodone-homatropine [facility]) 5-1.5 MG/5ML syrup 5 mL, 5 mL, Oral, Q4H PRN, Edsel Petrin, DO, 5 mL at 07/29/18 2211 .  Influenza vac split quadrivalent PF (FLUARIX) injection 0.5 mL, 0.5 mL, Intramuscular, Tomorrow-1000, Mikhail, Hallett, DO .  insulin aspart (novoLOG) injection 0-9 Units, 0-9 Units, Subcutaneous, TID WC, Osvaldo ShipperKrishnan, Gokul, MD, 7 Units at 07/30/18 0901 .  insulin glargine (LANTUS) injection 30 Units, 30 Units, Subcutaneous, QHS, Osvaldo ShipperKrishnan, Gokul, MD, 30 Units at 07/29/18 2212 .  loperamide (IMODIUM) capsule 2 mg, 2 mg, Oral, TID PRN, Osvaldo ShipperKrishnan, Gokul, MD, 2 mg at 07/28/18 2237 .  magic mouthwash w/lidocaine, 5 mL, Oral, TID, Meyran, Tiana LoftKimberly Hannah, NP, 5 mL at 07/30/18 0900 .  methocarbamol (ROBAXIN) tablet 750 mg, 750 mg, Oral, QID, Mikhail, [place], DO, 750 mg at 07/30/18 0858 .  midodrine (PROAMATINE) tablet 5 mg, 5 mg, Oral, BID WC, Osvaldo ShipperKrishnan, Gokul, MD, 5 mg  at 07/30/18 0900 .  morphine 2 MG/ML injection 2 mg, 2 mg, Intravenous, Q4H PRN, Edsel PetrinMikhail, Maryann, DO, 2 mg at 07/21/18 2245 .  multivitamin with minerals tablet 1 tablet, 1 tablet, Oral, Daily, Osvaldo ShipperKrishnan, Gokul, MD, 1 tablet at 07/30/18 0859 .  nystatin (MYCOSTATIN) 100000 UNIT/ML suspension 500,000 Units, 5 mL, Oral, TID AC & HS, Edsel PetrinMikhail, Maryann, DO, 500,000 Units at 07/29/18 2212 .  ondansetron (ZOFRAN) tablet 4 mg, 4 mg, Oral, Q6H PRN **OR** ondansetron (ZOFRAN) injection 4 mg, 4 mg, Intravenous, Q6H PRN, Mikhail, Maryann, DO .  oxyCODONE (Oxy IR/ROXICODONE) immediate release tablet 15 mg, 15 mg, Oral, Q3H PRN, Edsel PetrinMikhail, Maryann, DO, 15 mg at 07/29/18 2211 .  romiPLOStim (NPLATE) injection 250 mcg, 250 mcg, Subcutaneous, Q Jinny Blossomhu, Ennever, Peter R, MD, 250 mcg at 07/27/18 1307 .  sodium chloride flush (NS) 0.9 % injection 10-40 mL, 10-40 mL, Intracatheter, Q12H, Regalado, Belkys A, MD, 10 mL at 07/29/18 2212 .  sodium chloride flush (NS) 0.9 % injection 10-40 mL, 10-40 mL, Intracatheter, PRN, Regalado, Belkys A, MD .  sodium chloride flush (NS) 0.9 % injection 3 mL, 3 mL, Intravenous, PRN, Edsel PetrinMikhail, Maryann, DO, 3 mL at 07/20/18 1010   Physical Exam: Stable quadriparesis, better in the RUE than LUE  Assessment & Plan: 52 y.o. man s/p complicated spine surgery history with PE on bival gtt. -no change in neurosurgical plan  Jadene Pierinihomas A Williard Keller  07/30/18 9:27 AM

## 2018-07-30 NOTE — Progress Notes (Signed)
ANTICOAGULATION CONSULT NOTE -  Consult follow-up Note   Pharmacy Consult for Bivaliruidin Indication: pulmonary embolus and bilateral DVT  Allergies  Allergen Reactions  . Nsaids Other (See Comments)    G6PD DEFICIENCY  . Sulfa Antibiotics Other (See Comments)    G6PD DEFICIENCY  . Protonix [Pantoprazole Sodium] Diarrhea    Severe diarrhea, Upset stomach    Patient Measurements: Height: 5\' 9"  (175.3 cm) Weight: 281 lb 4.9 oz (127.6 kg) IBW/kg (Calculated) : 70.7 Bivalirudin Dosing Weight: 127.6 kg  Vital Signs: Temp: 98.4 F (36.9 C) (11/17 1120) Temp Source: Oral (11/17 1120) BP: 140/91 (11/17 1120) Pulse Rate: 95 (11/17 1120)  Labs: Recent Labs    07/28/18 0139  07/28/18 2017 07/29/18 0242 07/30/18 0232 07/30/18 0719  HGB 9.7*  --   --  9.1*  --  9.2*  HCT 29.8*  --   --  29.5*  --  29.9*  PLT 58*  --   --  61*  --  63*  APTT 56*   < > 57* 56* 50*  --   CREATININE  --   --   --  0.95  --   --    < > = values in this interval not displayed.    Estimated Creatinine Clearance: 120.3 mL/min (by C-G formula based on SCr of 0.95 mg/dL).  Assessment:  52 yr old male with new PE per 11/13 CTA and bilateral DVT per duplex today, and thrombocytpenia. Has IVC filter.  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  Last MRI (11/10) did not show evidence of compressive hematoma.  Hx PE and DVT and was on Eliquis prior to admit. Dr. Myna HidalgoEnnever and Dr. Wynetta Emeryram discussed risk/benefit and to begin Bivalirudin with low therapeutic goal.  Previously on Argatroban 10/22>10/23 then Bivalirudin 10/23>10/25. Noted switched due to Argatroban possibly contributing to bradycardia and hypotension. Pharmacy has been consulted to restart Bivalirudin.   APTT remains therapeutic this morning but trended down to 50 on Bivalirudin 0.15 mg/kg/hr. CBC low stable with Hgb 9.2 and platelets 63. Per RN, no bleeding reported, and no issues with infusion. RN reports  small bruise on patient but stable over the last few days.    2nd weekly Nplate dose given 11/14.  Goal of Therapy:  aPTT 50-67 seconds (lower end of usual range 50-85 seconds) seconds Monitor platelets by anticoagulation protocol: Yes   Plan:  Continue Bivalirudin at 0.15 mg/kg/hr Monitor CBC daily. APTT at least daily. Monitor for any signs or symptoms of bleeding.   Harlow MaresAmy Copper Basnett, PharmD PGY1 Pharmacy Resident Phone [phone]  07/30/2018   12:22 PM

## 2018-07-31 LAB — APTT
APTT: 128 s — AB (ref 24–36)
APTT: 53 s — AB (ref 24–36)
aPTT: 47 seconds — ABNORMAL HIGH (ref 24–36)
aPTT: 51 seconds — ABNORMAL HIGH (ref 24–36)

## 2018-07-31 LAB — BASIC METABOLIC PANEL
Anion gap: 9 (ref 5–15)
BUN: 37 mg/dL — AB (ref 6–20)
CALCIUM: 9.3 mg/dL (ref 8.9–10.3)
CO2: 28 mmol/L (ref 22–32)
CREATININE: 0.64 mg/dL (ref 0.61–1.24)
Chloride: 105 mmol/L (ref 98–111)
GFR calc Af Amer: >60 mL/min (ref >60)
GFR calc non Af Amer: >60 mL/min (ref >60)
GLUCOSE: 227 mg/dL — AB (ref 70–99)
Potassium: 3.9 mmol/L (ref 3.5–5.1)
Sodium: 142 mmol/L (ref 135–145)

## 2018-07-31 LAB — CBC
HCT: 31.5 % — ABNORMAL LOW (ref 39.0–52.0)
Hemoglobin: 9.6 g/dL — ABNORMAL LOW (ref 13.0–17.0)
MCH: 29.7 pg (ref 26.0–34.0)
MCHC: 30.5 g/dL (ref 30.0–36.0)
MCV: 97.5 fL (ref 80.0–100.0)
Platelets: 85 10*3/uL — ABNORMAL LOW (ref 150–400)
RBC: 3.23 MIL/uL — ABNORMAL LOW (ref 4.22–5.81)
RDW: 17 % — AB (ref 11.5–15.5)
WBC: 5.2 10*3/uL (ref 4.0–10.5)
nRBC: 3.1 % — ABNORMAL HIGH (ref 0.0–0.2)

## 2018-07-31 LAB — LACTATE DEHYDROGENASE: LDH: 437 U/L — AB (ref 98–192)

## 2018-07-31 LAB — GLUCOSE, CAPILLARY
GLUCOSE-CAPILLARY: 308 mg/dL — AB (ref 70–99)
GLUCOSE-CAPILLARY: 331 mg/dL — AB (ref 70–99)
Glucose-Capillary: 209 mg/dL — ABNORMAL HIGH (ref 70–99)
Glucose-Capillary: 239 mg/dL — ABNORMAL HIGH (ref 70–99)

## 2018-07-31 NOTE — Progress Notes (Signed)
So far, [name] is doing well with the infusional bivalirudin.  Would seem a more amazing is the fact that his platelet count is now going up.  His platelet count today is 85,000.  His LDH is 437.  His hemoglobin is 9.6.  I still cannot explain why he has the rising platelet count now.  I am just happy that his platelet count is on the rise.  Pharmacy is doing a great job in managing the bivalirudin.  I think that I would keep him on bivalirudin for several days.  If his platelet count continues to trend upward, then may be by the end of the week, I will consider switch him over to an oral anticoagulant.  He really has no change neurologically.  There might be a little bit more swelling in his feet.  I would not do any follow-up CT angiogram or lower extremity Dopplers probably for a month in order for us to adequately assess his response to anticoagulation.  He seems to be eating a little bit better.  Again, there is no bleeding.  He seems to be a little bit perkier this morning.  I would not change anything with respect to his anticoagulation right now.  It will be very interesting to see how his platelet count trends this week.  Christin BachPete Ennever, MD  Romans 8:28

## 2018-07-31 NOTE — Progress Notes (Signed)
ANTICOAGULATION CONSULT NOTE -  Consult follow-up Note   Pharmacy Consult for Bivaliruidin Indication: pulmonary embolus and bilateral DVT  Allergies  Allergen Reactions  . Nsaids Other (See Comments)    G6PD DEFICIENCY  . Sulfa Antibiotics Other (See Comments)    G6PD DEFICIENCY  . Protonix [Pantoprazole Sodium] Diarrhea    Severe diarrhea, Upset stomach    Patient Measurements: Height: 5\' 9"  (175.3 cm) Weight: 281 lb 4.9 oz (127.6 kg) IBW/kg (Calculated) : 70.7 Bivalirudin Dosing Weight: 127.6 kg  Vital Signs: Temp: 98.9 F (37.2 C) (11/18 1923) Temp Source: Oral (11/18 1923) BP: 131/86 (11/18 1600) Pulse Rate: 92 (11/18 1600)  Labs: Recent Labs    07/29/18 0242  07/30/18 0719 07/31/18 0440 07/31/18 1148 07/31/18 1623 07/31/18 1831  HGB 9.1*  --  9.2* 9.6*  --   --   --   HCT 29.5*  --  29.9* 31.5*  --   --   --   PLT 61*  --  63* 85*  --   --   --   APTT 56*   < >  --  47* 51* 128* 53*  CREATININE 0.95  --   --  0.64  --   --   --    < > = values in this interval not displayed.    Estimated Creatinine Clearance: 142.8 mL/min (by C-G formula based on SCr of 0.64 mg/dL).  Assessment:  52 yr old male with new PE per 11/13 CTA and bilateral DVT per duplex today, and thrombocytpenia. Has IVC filter.  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  Last MRI (11/10) did not show evidence of compressive hematoma.  Hx PE and DVT and was on Eliquis prior to admit. Dr. Myna HidalgoEnnever and Dr. Wynetta Emeryram discussed risk/benefit and to begin Bivalirudin with low therapeutic goal.  Previously on Argatroban 10/22>10/23 then Bivalirudin 10/23>10/25. Noted switched due to Argatroban possibly contributing to bradycardia and hypotension. Pharmacy has been consulted to restart Bivalirudin.   APTT 53sec drawn correctly - peripheral stick - at goal (previous aptt 128 drawn from PICC line where bival infusing). No active bleeding or issues with infusion per  discussion with RN. Hg low stable, plt improved to 85.  Goal of Therapy:  aPTT 50-67 seconds (lower end of usual range 50-85 seconds) seconds Monitor platelets by anticoagulation protocol: Yes    Plan: Continue Bivalirudin at 0.17 mg/kg/hr Daily aPTT/CBC Monitor for any signs or symptoms of bleeding F/u Oncology plans  Leota SauersLisa Amiyah Shryock Pharm.D. CPP, BCPS Clinical Pharmacist [phone] 07/31/2018 8:22 PM

## 2018-07-31 NOTE — Progress Notes (Addendum)
ANTICOAGULATION CONSULT NOTE -  Consult follow-up Note   Pharmacy Consult for Bivaliruidin Indication: pulmonary embolus and bilateral DVT  Allergies  Allergen Reactions  . Nsaids Other (See Comments)    G6PD DEFICIENCY  . Sulfa Antibiotics Other (See Comments)    G6PD DEFICIENCY  . Protonix [Pantoprazole Sodium] Diarrhea    Severe diarrhea, Upset stomach    Patient Measurements: Height: 5\' 9"  (175.3 cm) Weight: 281 lb 4.9 oz (127.6 kg) IBW/kg (Calculated) : 70.7 Bivalirudin Dosing Weight: 127.6 kg  Vital Signs: Temp: 98.2 F (36.8 C) (11/18 0701) Temp Source: Oral (11/18 0701) BP: 156/99 (11/18 0701) Pulse Rate: 92 (11/18 0701)  Labs: Recent Labs    07/29/18 0242 07/30/18 0232 07/30/18 0719 07/31/18 0440  HGB 9.1*  --  9.2* 9.6*  HCT 29.5*  --  29.9* 31.5*  PLT 61*  --  63* 85*  APTT 56* 50*  --  47*  CREATININE 0.95  --   --  0.64    Estimated Creatinine Clearance: 142.8 mL/min (by C-G formula based on SCr of 0.64 mg/dL).  Assessment:  52 yr old male with new PE per 11/13 CTA and bilateral DVT per duplex today, and thrombocytpenia. Has IVC filter.  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  Last MRI (11/10) did not show evidence of compressive hematoma.  Hx PE and DVT and was on Eliquis prior to admit. Dr. Myna HidalgoEnnever and Dr. Wynetta Emeryram discussed risk/benefit and to begin Bivalirudin with low therapeutic goal.  Previously on Argatroban 10/22>10/23 then Bivalirudin 10/23>10/25. Noted switched due to Argatroban possibly contributing to bradycardia and hypotension. Pharmacy has been consulted to restart Bivalirudin.   APTT slightly subtherapeutic this AM at 47 secs - has been steadily trending down. No active bleeding or issues with infusion per discussion with RN. Hg low stable, plt improved to 85.  Goal of Therapy:  aPTT 50-67 seconds (lower end of usual range 50-85 seconds) seconds Monitor platelets by anticoagulation  protocol: Yes   Plan:  Increase Bivalirudin to 0.17 mg/kg/hr - conservative increase with bleeding history 2h APTT Daily aPTT/CBC Monitor for any signs or symptoms of bleeding.  Babs BertinHaley Leandro Berkowitz, PharmD, BCPS Clinical Pharmacist Clinical phone [phone] Please check AMION for all [facility] contact numbers 07/31/2018 9:03 AM   ADDENDUM:  APTT now therapeutic at low end of range at 51 seconds. No bleed documented.  Plan: Continue Bivalirudin at 0.17 mg/kg/hr 2h APTT to confirm Daily aPTT/CBC Monitor for any signs or symptoms of bleeding F/u Oncology plans  Babs BertinHaley Alahia Whicker, PharmD, BCPS Please check AMION for all [facility] contact numbers Clinical Pharmacist 07/31/2018 2:12 PM

## 2018-07-31 NOTE — Progress Notes (Addendum)
Physical Therapy Treatment Patient Details Name: [name] [name] MRN: [id number] DOB: 1965/12/22 Today's Date: 07/31/2018    History of Present Illness 52 y.o. male admitted on 06/06/18 for bil LE weakness and muscle spasm after multiple falls (with resultant R knee meniscus tear placed in bledsoe brace) following a recent ACDF at [facility] in July 2019 (complicated by PE (s/p IVC filter), CIR, and then SNF placement before finally returning home with his parents).  In the ED MRI of c-spine which showed post operative changes and cord edema and possibility of ischemia.  His R knee was aspirated on 06/09/18.  S/p posterior cervical decompression and fusion on 06/14/18. Entire acute course complicated by hypotension in standing (thought to be autonomic) and by finding of multiple LE DVTs (already has an IVC filter in place, but per MDs notes at risk for blocking the filter with these clots).  Pt with post op issues with incisional bleeding s/p 2 evacuations of hematomas and placement of hemovac (06/22/18 and 07/01/18) as well as multiple units of plasma.  Cardiology consulted 07/05/18 due to bradycardia thought to be from his newly started blood thinner.  Pt with another re-accumulation of epidural hematoma with increased L sided weakness and cord compression with emergent evacuation surgery 10/25 & 07/10/18.  Pt also needing more platelets post op.  Bone marrow bx 07/12/18.  Pt found to have submassive PE on 07/27/18 and re-started on anticoagulation. PMHx of HTN, gout, anemia, DDD (lumbar)    PT Comments    Goal update due today.  Pt tolerated bed level mobility today with continued total assist.  He does not have enough strength even in his right UE and LE to assist much with his moving.  Left leg has no movement, not even trace that I can elicit.  Right leg has weak movement 2-/5 ankle 2+/5 quad, 2-/5 hip.  He is now in the soft collar, so I am eager to get him seated EOB so that he can work on head and upper  back control.  He remains appropriate for SNF level rehab at discharge.    Follow Up Recommendations  SNF     Equipment Recommendations  Hospital bed;Wheelchair (measurements PT);Wheelchair cushion (measurements PT);Other (comment)(22x20 tilt in space power chair, hoyer lift)    Recommendations for Other Services   NA     Precautions / Restrictions Precautions Precautions: Fall;Cervical;Other (comment) Precaution Comments: quadriparesis, developing some behind sores (ischial area bil) Required Braces or Orthoses: Cervical Brace Cervical Brace: Soft collar    Mobility  Bed Mobility Overal bed mobility: Needs Assistance Bed Mobility: Rolling Rolling: +2 for physical assistance;Total assist         General bed mobility comments: Two person total assist to roll for peri care bil.              Cognition Arousal/Alertness: Awake/alert Behavior During Therapy: WFL for tasks assessed/performed Overall Cognitive Status: Within Functional Limits for tasks assessed                                 General Comments: Better affect today.       Exercises General Exercises - Upper Extremity Shoulder Flexion: AAROM;PROM;Right;Left;10 reps Elbow Flexion: AAROM;PROM;Right;Left;10 reps Wrist Extension: AAROM;PROM;Right;Left;10 reps General Exercises - Lower Extremity Ankle Circles/Pumps: Both;20 reps;PROM Heel Slides: PROM;Both;10 reps Hip ABduction/ADduction: PROM;Both;10 reps        Pertinent Vitals/Pain Pain Assessment: Faces Faces Pain Scale: Hurts whole lot Pain  Location: body with mobility, generalized.  Pain Intervention(s): Limited activity within patient's tolerance;Monitored during session;Repositioned           PT Goals (current goals can now be found in the care plan section) Acute Rehab PT Goals Patient Stated Goal: to get a positioning wedge PT Goal Formulation: With patient Time For Goal Achievement: 08/14/18 Potential to Achieve Goals:  Fair Additional Goals Additional Goal #1: Pt will independently call for position changes every two hours to help with his pressure relief. Progress towards PT goals: Progressing toward goals(goals due for update)    Frequency    Min 2X/week      PT Plan Current plan remains appropriate       AM-PAC PT "6 Clicks" Daily Activity  Outcome Measure  Difficulty turning over in bed (including adjusting bedclothes, sheets and blankets)?: Unable Difficulty moving from lying on back to sitting on the side of the bed? : Unable Difficulty sitting down on and standing up from a chair with arms (e.g., wheelchair, bedside commode, etc,.)?: Unable Help needed moving to and from a bed to chair (including a wheelchair)?: Total Help needed walking in hospital room?: Total Help needed climbing 3-5 steps with a railing? : Total 6 Click Score: 6    End of Session Equipment Utilized During Treatment: Cervical collar Activity Tolerance: Patient tolerated treatment well Patient left: in bed;with call bell/phone within reach Nurse Communication: Mobility status PT Visit Diagnosis: Other abnormalities of gait and mobility (R26.89);Other symptoms and signs involving the nervous system (R29.898);Muscle weakness (generalized) (M62.81)     Time: [id number] PT Time Calculation (min) (ACUTE ONLY): 66 min  Charges:  $Therapeutic Exercise: 8-22 mins $Therapeutic Activity: 38-52 mins           Allycia Pitz B. Adasia Hoar, PT, DPT  Acute Rehabilitation [phone] pager #[phone] office            07/31/2018, 4:28 PM

## 2018-07-31 NOTE — Progress Notes (Signed)
Inpatient Diabetes Program Recommendations  AACE/ADA: New Consensus Statement on Inpatient Glycemic Control (2019)  Target Ranges:  Prepandial:   less than 140 mg/dL      Peak postprandial:   less than 180 mg/dL (1-2 hours)      Critically ill patients:  140 - 180 mg/dL   Results for [name], [name] (MRN [id number]) as of 07/31/2018 13:07  Ref. Range 07/30/2018 07:38 07/30/2018 11:16 07/30/2018 15:24 07/30/2018 21:09 07/31/2018 07:38  Glucose-Capillary Latest Ref Range: 70 - 99 mg/dL 086333 (H) 578345 (H) 469318 (H) 266 (H) 209 (H)   Review of Glycemic Control  Diabetes history: No Outpatient Diabetes medications: NA Current orders for Inpatient glycemic control: Lantus 35 units QHS, Novolog 0-15 units TID with meals, Novolog 0-5 units QHS; Decadron 4 mg Q24H  Inpatient Diabetes Program Recommendations:   Insulin - Basal: If Decadron is continued as ordered, please consider increasing Lantus to 38 units QHS.  Insulin - Meal Coverage: If Decadron is continued as ordered, please consider ordering Novolog 5 units TID with meals for meal coverage if patient eats at least 50% of meals. If patient is not eating well but drinking supplements, may need to order Novolog 5 units QID scheduled along with timing of Ensure Enlive supplements.  Thanks, [name] PennerMarie Kelsha Older, RN, MSN, CDE Diabetes Coordinator Inpatient Diabetes Program [phone] (Team Pager from 8am to 5pm)

## 2018-07-31 NOTE — Progress Notes (Signed)
PROGRESS NOTE    [name] [name]  KDT:[id number] DOB: 1966-04-08 DOA: 06/06/2018 PCP: Helane Rima, MD    Brief Narrative: Brief Narrative:  52 year old man with past medical history relevant for gout, hypertension anterior cervical corpectomy of C4 on 03/24/2018 with comp gated postoperative course including development of multiple PEs and DVTs started on anticoagulation and subsequently developed hematoma requiring reexploration on 03/28/2018 status post placement of IVC filter who was discharged to skilled nursing facility and had a complicated course including progressive lower extremity weakness, intermittent fevers of unclear etiology and hospitalizations for "pneumonia". Patient was admitted to this hospital is a transfer from [facility] with MRI of C-spine that showed postoperative changes and cord compression with cord edema and concern for ischemia. Patient is status post posterior cervical decompression laminectomy of C3, C4, C5, removal of spinous processes, foraminotomies on 06/14/2018. On 06/22/2018 patient began to develop worsening left sided paresthesias and weakness and a stat MRI showed evidence of postoperative hematoma. Patient was taken to the OR with evacuation of hematoma. Additionally his postoperative course has been complicated by recurrent fevers,bilateral lower extremity DVTs on ultrasound,migratory arthralgias/arthritis that are thought to be rheumatologic in nature as well as significant orthostasis limiting the amount he can work with physical therapy. His arthralgia has resolved.   Noted to have decreased left hand grip on 10/19, MRI showed large epidural hematoma, patient s/p clot evacuation.  He was place back on anticoagulation argatroban on 10-22. He was notice to be more bradycardic with HR in the 40 on 10-23. Cardiology was consulted, and argatroban was transition to bivalirrubin to avoid bradycardia. His HR improved after argatroban was discontinue.  -HIT panel 0.486 (mildly elevated)- SRA (serotonin release assay) negative. Dr Marin Olp has been helping with patient care.   Patient developed weakness of left arm on 10-25. He was take emergently to OR for recurrence of epidural hematoma on 10-25. Anticoagulation was discontinue since 10-25. Dr Saintclair Halsted is recommending for patient to remain off anticoagulation for alt leas 1 week, and if anticoagulation is consider, that will need to be discuss with DR Saintclair Halsted.  Patient the night of 10-28 develops left arm weakness, numbness. Stat MRI was ordered, which confirm recurrent hematoma. Patient was taken to OR for hematoma evacuation 10-28. He has been off anticoagulation since 10-25. For his thrombocytopenia, a Bone marrow biopsy was done. For LE DVT, he has IVC filter. He cannot be on anticoagulation due to recurrent bleeding.  Patient has received multiple platelet transfusions.  Patient underwent CT scan of his chest on 11/13 which showed new pulmonary embolism.  Patient was started on bivalirudin.  Platelet counts appear to be improving.    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  -MRI done on 11/10 shows improvement in the fluid collection.  Other findings as per neurosurgery. Neurosurgery continues to follow.  Based on last MRI findings there is no plan for any further surgical intervention.  Dexamethasone dose has been slowly tapered down.  Currently on once a day dose.  Continues to have left-sided weakness.  No changes compared to yesterday.  Acute pulmonary embolism/DVT in setting of spine surgery -Status post IVC filter placement on March 28, 2018 -Patient was on apixaban and changed over to IV heparin.  However these agents had to be discontinued due to recurrent episodes of hematoma at the surgical site.  -Vascular US 06/26/2018 showed bilateral lower extremity DVT likely acute -Case was discussed with  vascular surgery, Dr. Trula Slade.  Also discussed with interventional radiology.  Unfortunately patient does not have any good options vis--vis either thrombolysis or mechanical thrombectomy. He was kept off of anticoagulation due to recurrent bleeding.   However CT scan 11/13 showed new pulmonary embolism with evidence for right heart strain.   Clinically patient has been stable without any hemodynamic compromise.  Echocardiogram does not show any acute findings.  Discussions held between hematology as well as neurosurgery.  Subsequently patient was started on bivalirudin.  Patient seems to be tolerating anticoagulation well without any signs of bleeding.  Platelet counts have been slowly improving.  Continue current treatment for now.   Thrombocytopenia/coagulopathy -Cardiolipin antibody negative -Lupus anticoagulant, homocysteine level, factor VIII within normal limits -Protein S activity 60 (low) -Protein C total >200; Activity 199 (high)  -HIT panel 0.486 (mildly elevated)- SRA (serotonin release assay) negative -immature platelet fraction WNL -Was given 2u  platelets on 10/19 in the OR. And 2 units 10-25 for OR. Platelet transfusion 10/29 Patient transfused platelets and given vitamin K on 10/30. Patient underwent bone marrow biopsy on 10/30.  Pathology report suggests nonspecific findings.   Hematology suspects that some of his coagulopathy and platelet low platelet counts are likely due to medications.  But no clear culprit has been identified.   Platelet counts noted to be low on 11/13.  Patient was transfused platelets.  At patient's request I did called [facility] on 11/14 and I spoke to Dr. Dyann Kief who is a hematology/oncology specialist.  See my note from 11/15.  No indication for transfer to [place]. He is on Nplate on a weekly basis.  Platelet counts appear to be improving.  Continue to monitor.  Hyperglycemia secondary to steroid Steroid is being tapered down gradually.  Patient did have an episode of hypoglycemia few days ago.  His dose of Lantus was reduced.  Hypoglycemic episode could have been due to poor oral intake.  CBGs rebounded.  Lantus dose was increased yesterday evening.  Continue to monitor.  May have to further adjust the dose.  HbA1c was 5.8 when last checked.    Normocytic anemia Patient did get 1 unit of PRBC earlier during this hospitalization.  Hemoglobin has been stable.  No evidence of overt bleeding.    Orthostatic hypotension with near syncope Thought to be related to hypovolemia and autonomic dysfunction.  Echocardiogram July 2019 showed a normal systolic function, grade 2 diastolic dysfunction.  No significant valve abnormalities noted. Patient was placed on midodrine.  Midodrine dose was decreased due to bradycardia.  Abdominal binder.  Last few readings of his blood pressure have been in the hypertensive  range.  Could be due to midodrine.  Continue to monitor for now.  Sinus bradycardia Patient initially developed bradycardia on 10/23.  He was asymptomatic.  TSH was 1.24.  Cardiology was  consulted at that time.  There was no indication for pacemaker.  His heart rate stabilized and improved.  He again became bradycardic on 11/1.  EKG shows sinus bradycardia.  Telemetry did not show any pauses.  Dose of midodrine was decreased as midodrine can sometimes cause bradycardia.  Autonomic dysfunction is another possible etiology.  Avoid AV nodal blocking agents.  Heart rate is stable.  Polyarthralgia/persistent fever -At the time of admission patient had symptoms in the shoulders and ankles, along with a history of gout and pain in his knees.  He underwent arthrocentesis however no evidence of infection was found. -He was noted to have elevated inflammatory markers including CCP, CRP.  However ANCA, aldolase, ANA were negative. -Previous rounding physicians have discussed with Dr. Kathlene November, rheumatologist.  Patient will need to follow-up with this provider in the outpatient setting. -Patient was placed on prednisone however currently on dexamethasone (neurosurgery) No further episodes of fever.  Patient was on doxycycline for unclear reason and this was discontinued on 10/31.    Upper and lower extremity edema Edema improved with diuretics.  He remains on once a day dosing.  Between 11/5 and 11/14 patient has lost about 7 kg.  Monitor electrolytes periodically.  Gout Stable.  Uloric on hold due to his thrombocytopenia.  Acute renal failure/hypokalemia Renal function is back to baseline.  Remains stable.  Monitor while he is getting diuretics.  Essential hypertension  Amlodipine discontinue due to orthostatic hypotension and edema.  Over the last day or so his blood pressure readings have been high.  Continue to monitor for now.  He is on midodrine as discussed above.  Adjustment disorder with depression and anxiety Patient noted to have flat affect and depressed mood and so psychiatry was consulted.  They recommended changing Xanax to Klonopin on a scheduled basis.  They also recommended  sertraline.  He was changed over from alprazolam to Klonopin however did not start him on sertraline due to his hematological issues.    Abnormal CT findings CT scan raised concern for pneumonia but patient respiratory symptoms.  Patient did not have any respiratory symptoms.  Gallstones were noted as well.  He remains asymptomatic.  Stage II pressure ulcer on the right ischial tuberosity Wound care nurse is following.  Suspected Oropharyngeal candidiasis Patient was given a course of fluconazole which he has completed.  DVT prophylaxis: TED stockings Code Status: Full code Family Communication: Discussed with the patient Disposition Plan: Management as outlined above.  Patient remains stable on anticoagulation with no evidence of bleeding and stable platelet counts.      Consultants:   Neurosurgery  Hematology  Cardiology    Procedures: On 10/2 1. posterior cervical decompressive laminectomy at C3, C4, C5 with removal of the spinous process and complete laminectomy and foraminotomies of the C3, C4, C5 nerve roots. 2. Posterior cervical fusion with lateral mass screws at C3-C4-C5 and pars screws at C2 lies in the globus ellipseLateral mass screw system 3. Posterior lateral arthrodesis C2-C5 utilizing locally harvested autograft mixed with vivigen  06/22/2018: Procedure reexploration posterior cervical wound for evacuation of epidural hematoma  06/26/2018 DVT ultrasound:Right: Findings consistent with acute deep vein thrombosis involving the right common femoral vein, right femoral vein, right popliteal vein, right posterior tibial vein, and right peroneal vein. Thrombosis extends proximally  into external iliac vein.  Proximal iliac vein not visualized. Left: Findings consistent with acute deep vein thrombosis involving the left common femoral vein, left femoral vein, left popliteal vein, left posterior tibial vein, and left peroneal vein. Findings consistent with acute  superficial vein thrombosis  involving the left great saphenous vein. Thrombosis extends proximally into external iliac vein. Proximal iliac vein not visualized.  07/01/18 Clot evacuation   RUE doppler 07/02/18    Antimicrobials:  Completed course of Ancef  Subjective: Patient denies any complaints.  Occasional loose stools which he attributes to the hospital food.  Denies any abdominal pain nausea or vomiting  Objective: Vitals:   07/30/18 2300 07/31/18 0300 07/31/18 0701 07/31/18 0800  BP: (!) 146/99 (!) 146/104 (!) 156/99 (!) 153/100  Pulse: 98 97 92 93  Resp:  (!) 24 (!) 27 (!) 27  Temp: 98.4 F (36.9 C) 98.3 F (36.8 C) 98.2 F (36.8 C)   TempSrc: Oral Oral Oral   SpO2: 92% 98% 98% 98%  Weight:      Height:        Intake/Output Summary (Last 24 hours) at 07/31/2018 1127 Last data filed at 07/31/2018 0900 Gross per 24 hour  Intake 4000.65 ml  Output 1650 ml  Net 2350.65 ml   Filed Weights   07/07/18 1000 07/18/18 0600 07/27/18 1502  Weight: 123.8 kg 135 kg 127.6 kg    Examination:  General exam: He is awake alert.  In no distress Respiratory system: Normal effort at rest.  Clear to auscultation bilaterally Cardiovascular system: S1-S2 is normal regular.  No S3-S4.  No rubs murmurs or bruit Gastrointestinal system: Abdomen is soft.  Nontender nondistended.  Bowel sounds are present normal.  No masses organomegaly. Edema present bilateral lower extremities and upper extremities but improved compared to before    Data Reviewed: I have personally reviewed following labs and imaging studies  CBC: Recent Labs  Lab 07/25/18 0508 07/26/18 0600 07/27/18 0430 07/28/18 0139 07/29/18 0242 07/30/18 0719 07/31/18 0440  WBC 3.5* 4.2 4.4 5.1 4.8 4.4 5.2  NEUTROABS 2.7 2.6  --   --   --   --   --   HGB 10.1* 9.8* 9.9* 9.7* 9.1* 9.2* 9.6*  HCT 32.9* 31.9* 31.2* 29.8* 29.5* 29.9* 31.5*  MCV 97.1 97.0 97.8 94.6 97.4 98.7 97.5  PLT 36* 27* 57* 58* 61* 63* 85*     Basic Metabolic Panel: Recent Labs  Lab 07/26/18 0600 07/27/18 0430 07/29/18 0242 07/31/18 0440  NA 142 142 139 142  K 3.2* 3.6 3.7 3.9  CL 102 103 103 105  CO2 34* _0 GLUCOSE 164* 280* 375* 227*  BUN 57* 51* 52* 37*  CREATININE 0.74 0.65 0.95 0.64  CALCIUM 9.3 9.4 9.0 9.3  MG  --  2.0  --   --    GFR: Estimated Creatinine Clearance: 142.8 mL/min (by C-G formula based on SCr of 0.64 mg/dL).  Coagulation Profile: Recent Labs  Lab 07/27/18 0430  INR 1.25   CBG: Recent Labs  Lab 07/30/18 0738 07/30/18 1116 07/30/18 1524 07/30/18 2109 07/31/18 0738  GLUCAP 333* 345* 318* 266* 209*      Radiology Studies: No results found.      Scheduled Meds: . clonazePAM  0.5 mg Oral BID  . dexamethasone  4 mg Intravenous Q24H  . feeding supplement (ENSURE ENLIVE)  237 mL Oral QID  . furosemide  40 mg Intravenous Daily  . gabapentin  300 mg Oral TID  .  Gerhardt's butt cream   Topical BID  . Influenza vac split quadrivalent PF  0.5 mL Intramuscular Tomorrow-1000  . insulin aspart  0-15 Units Subcutaneous TID WC  . insulin aspart  0-5 Units Subcutaneous QHS  . insulin glargine  35 Units Subcutaneous QHS  . magic mouthwash w/lidocaine  5 mL Oral TID  . methocarbamol  750 mg Oral QID  . midodrine  5 mg Oral BID WC  . multivitamin with minerals  1 tablet Oral Daily  . nystatin  5 mL Oral TID AC & HS  . romiPLOStim  250 mcg Subcutaneous Q Thu  . sodium chloride flush  10-40 mL Intracatheter Q12H   Continuous Infusions: . sodium chloride 10 mL/hr at 07/25/18 2300  . bivalirudin (ANGIOMAX) infusion 0.5 mg/mL (Non-ACS indications) 0.17 mg/kg/hr (07/31/18 0916)     LOS: 69 days     Bonnielee Haff, MD [facility] Pager [phone]  If 7PM-7AM, please contact night-coverage [email or web address] Password [email or web address] 07/31/2018, 11:27 AM

## 2018-08-01 LAB — CBC
HCT: 30.4 % — ABNORMAL LOW (ref 39.0–52.0)
Hemoglobin: 9.4 g/dL — ABNORMAL LOW (ref 13.0–17.0)
MCH: 29.9 pg (ref 26.0–34.0)
MCHC: 30.9 g/dL (ref 30.0–36.0)
MCV: 96.8 fL (ref 80.0–100.0)
Platelets: 112 10*3/uL — ABNORMAL LOW (ref 150–400)
RBC: 3.14 MIL/uL — AB (ref 4.22–5.81)
RDW: 17 % — ABNORMAL HIGH (ref 11.5–15.5)
WBC: 5.5 10*3/uL (ref 4.0–10.5)
nRBC: 4 % — ABNORMAL HIGH (ref 0.0–0.2)

## 2018-08-01 LAB — GLUCOSE, CAPILLARY
GLUCOSE-CAPILLARY: 328 mg/dL — AB (ref 70–99)
Glucose-Capillary: 158 mg/dL — ABNORMAL HIGH (ref 70–99)
Glucose-Capillary: 260 mg/dL — ABNORMAL HIGH (ref 70–99)
Glucose-Capillary: 357 mg/dL — ABNORMAL HIGH (ref 70–99)

## 2018-08-01 LAB — LACTATE DEHYDROGENASE: LDH: 451 U/L — ABNORMAL HIGH (ref 98–192)

## 2018-08-01 LAB — APTT
APTT: 49 s — AB (ref 24–36)
APTT: 55 s — AB (ref 24–36)
aPTT: 54 seconds — ABNORMAL HIGH (ref 24–36)

## 2018-08-01 LAB — ADAMTS13 ANTIBODY: ADAMTS13 Antibody: 14 Units/mL — ABNORMAL HIGH (ref <12)

## 2018-08-01 LAB — PROTHROMBIN GENE MUTATION

## 2018-08-01 LAB — FACTOR 5 LEIDEN

## 2018-08-01 LAB — HOMOCYSTEINE: Homocysteine: 13.2 umol/L (ref 0.0–15.0)

## 2018-08-01 LAB — ADAMTS13 ACTIVITY: Adamts 13 Activity: 12.8 % — ABNORMAL LOW (ref >66.8)

## 2018-08-01 NOTE — Progress Notes (Signed)
PROGRESS NOTE    [name] [name]  PXT:[id number] DOB: 01/12/1966 DOA: 06/06/2018 PCP: Helane Rima, MD    Brief Narrative: Brief Narrative:  52 year old man with past medical history relevant for gout, hypertension anterior cervical corpectomy of C4 on 03/24/2018 with comp gated postoperative course including development of multiple PEs and DVTs started on anticoagulation and subsequently developed hematoma requiring reexploration on 03/28/2018 status post placement of IVC filter who was discharged to skilled nursing facility and had a complicated course including progressive lower extremity weakness, intermittent fevers of unclear etiology and hospitalizations for "pneumonia". Patient was admitted to this hospital is a transfer from [facility] with MRI of C-spine that showed postoperative changes and cord compression with cord edema and concern for ischemia. Patient is status post posterior cervical decompression laminectomy of C3, C4, C5, removal of spinous processes, foraminotomies on 06/14/2018. On 06/22/2018 patient began to develop worsening left sided paresthesias and weakness and a stat MRI showed evidence of postoperative hematoma. Patient was taken to the OR with evacuation of hematoma. Additionally his postoperative course has been complicated by recurrent fevers,bilateral lower extremity DVTs on ultrasound,migratory arthralgias/arthritis that are thought to be rheumatologic in nature as well as significant orthostasis limiting the amount he can work with physical therapy. His arthralgia has resolved.   Noted to have decreased left hand grip on 10/19, MRI showed large epidural hematoma, patient s/p clot evacuation.  He was place back on anticoagulation argatroban on 10-22. He was notice to be more bradycardic with HR in the 40 on 10-23. Cardiology was consulted, and argatroban was transition to bivalirrubin to avoid bradycardia. His HR improved after argatroban was discontinue.  -HIT panel 0.486 (mildly elevated)- SRA (serotonin release assay) negative. Dr Marin Olp has been helping with patient care.   Patient developed weakness of left arm on 10-25. He was take emergently to OR for recurrence of epidural hematoma on 10-25.  He was taken off of anticoagulation.  Patient the night of 10-28 develops left arm weakness, numbness. Stat MRI was ordered, which confirm recurrent hematoma. Patient was taken to OR for hematoma evacuation 10-28. For his thrombocytopenia, a Bone marrow biopsy was done. For LE DVT, he has IVC filter. Patient has received multiple platelet transfusions and Nplate.  Patient underwent CT scan of his chest on 11/13 which showed new pulmonary embolism.  Patient was started on bivalirudin after discussions between hematology as well as neurosurgery.  Platelet counts appear to be improving.    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  -MRI done on 11/10 shows improvement in the fluid collection.  Other findings as  per  neurosurgery. Neurosurgery continues to follow.  Based on last MRI findings there is no plan for any further surgical intervention.  Dexamethasone dose has been slowly tapered down.  Currently on once a day dose.  Continues to have left-sided weakness.  Patient remains same as yesterday.  Discussed with Dr. Saintclair Halsted yesterday who is considering referring patient to spine rehab center.  Acute pulmonary embolism/DVT in setting of spine surgery -Status post IVC filter placement on March 28, 2018 -Patient was on apixaban and changed over to IV heparin.  However these agents had to be discontinued due to recurrent episodes of hematoma at the surgical site.  -Vascular US 06/26/2018 showed bilateral lower extremity DVT likely acute -Case was discussed with  vascular surgery, Dr. Trula Slade.  Also discussed with interventional radiology.  Unfortunately patient does not have any good options vis--vis either thrombolysis or mechanical thrombectomy. He was kept off of anticoagulation due to recurrent bleeding.   However CT scan 11/13 showed new pulmonary embolism with evidence for right heart strain.   Clinically patient has been stable without any hemodynamic compromise.  Echocardiogram does not show any acute findings.  Discussions held between hematology as well as neurosurgery.  Subsequently patient was started on bivalirudin.  Patient tolerating his anticoagulation well so far without any signs of bleeding.  Platelet counts are improving.  Per hematology note it appears that plan is to continue current management till end of this week at which point, if everything is stable, he will be transitioned to oral anticoagulation.   Thrombocytopenia/coagulopathy -Cardiolipin antibody negative -Lupus anticoagulant, homocysteine level, factor VIII within normal limits -Protein S activity 60 (low) -Protein C total >200; Activity 199 (high)  -HIT panel 0.486 (mildly elevated)- SRA (serotonin release assay)  negative -immature platelet fraction WNL -Was given 2u platelets on 10/19 in the OR. And 2 units 10-25 for OR. Platelet transfusion 10/29 Patient transfused platelets and given vitamin K on 10/30. Patient underwent bone marrow biopsy on 10/30.  Pathology report suggests nonspecific findings.   Hematology suspects that some of his coagulopathy and platelet low platelet counts are likely due to medications.  But no clear culprit has been identified.   Platelet counts noted to be low on 11/13.  Patient was transfused platelets.  At patient's request I did called [facility] on 11/14 and I spoke to Dr. Dyann Kief who is a hematology/oncology specialist.  See my note from 11/15.  No indication for transfer to [place]. He is on Nplate on a weekly basis.  Platelet counts have improved.  Continue to monitor.  Hyperglycemia secondary to steroid Steroid is being tapered down gradually.  Patient did have an episode of hypoglycemia few days ago.  His dose of Lantus was reduced.  Hypoglycemic episode could have been due to poor oral intake.  CBGs rebounded.  Lantus dose was increased.  CBGs greater than 300 yesterday.  158 this morning.  According to nursing staff patient has been drinking supplements and fluids with high sugar content.  Patient was educated.  HbA1c was 5.8 when last checked.   Normocytic anemia Patient did get 1 unit of PRBC earlier during this hospitalization.  Hemoglobin has been stable.  No evidence of overt bleeding.    Orthostatic hypotension with near syncope Thought to be related to hypovolemia and autonomic dysfunction.  Echocardiogram July 2019 showed a normal systolic function, grade 2 diastolic dysfunction.  No significant valve abnormalities noted. Patient was placed on midodrine.  Midodrine dose was decreased due  to bradycardia.  Abdominal binder.  Last few readings of his blood pressure have been in the hypertensive range.  Could be due to midodrine.   Continue to monitor for now.  Sinus bradycardia Patient initially developed bradycardia on 10/23.  He was asymptomatic.  TSH was 1.24.  Cardiology was consulted at that time.  There was no indication for pacemaker.  His heart rate stabilized and improved.  He again became bradycardic on 11/1.  EKG shows sinus bradycardia.  Telemetry did not show any pauses.  Dose of midodrine was decreased as midodrine can sometimes cause bradycardia.  Autonomic dysfunction is another possible etiology.  Avoid AV nodal blocking agents.  Heart rate is stable.  Polyarthralgia/persistent fever -At the time of admission patient had symptoms in the shoulders and ankles, along with a history of gout and pain in his knees.  He underwent arthrocentesis however no evidence of infection was found. -He was noted to have elevated inflammatory markers including CCP, CRP.  However ANCA, aldolase, ANA were negative. -Previous rounding physicians have discussed with Dr. Kathlene November, rheumatologist.  Patient will need to follow-up with this provider in the outpatient setting. -Patient was placed on prednisone however currently on dexamethasone (neurosurgery) No further episodes of fever.  Patient was on doxycycline for unclear reason and this was discontinued on 10/31.    Upper and lower extremity edema Edema improved with diuretics.  He remains on once a day dosing.  Between 11/5 and 11/14 patient has lost about 7 kg.  Monitor electrolytes periodically.  Gout Stable.  Uloric on hold due to his thrombocytopenia.  Acute renal failure/hypokalemia Renal function is back to baseline.  Remains stable.  Monitor while he is getting diuretics.  Essential hypertension  Amlodipine discontinued due to orthostatic hypotension and edema.  Blood pressure better controlled over the last 24 hours. He is on midodrine as discussed above.  Adjustment disorder with depression and anxiety Patient noted to have flat affect and depressed mood and so  psychiatry was consulted.  They saw the patient on 11/2 and recommended changing Xanax to Klonopin on a scheduled basis.  They also recommended sertraline.  He was changed over from alprazolam to Klonopin however did not start him on sertraline due to his hematological issues.    Abnormal CT findings CT scan raised concern for pneumonia but patient respiratory symptoms.  Patient did not have any respiratory symptoms.  Gallstones were noted as well.  He remains asymptomatic.  Stage II pressure ulcer on the right ischial tuberosity, not present on admission Wound care nurse is following.  Suspected Oropharyngeal candidiasis Patient was given a course of fluconazole which he has completed.  Acute diarrhea Resolved with Imodium.  DVT prophylaxis: TED stockings Code Status: Full code Family Communication: Discussed with the patient Disposition Plan: Management as outlined above.  Platelet counts continue to improve.  He seems to be tolerating anticoagulation well without any signs of bleeding.  Hopefully hematology will be able to transition him to oral anticoagulation at the end of this week.  Neurosurgery considering referring him to a spine rehab center in [place].    Consultants:   Neurosurgery  Hematology  Cardiology    Procedures: On 10/2 1. posterior cervical decompressive laminectomy at C3, C4, C5 with removal of the spinous process and complete laminectomy and foraminotomies of the C3, C4, C5 nerve roots. 2. Posterior cervical fusion with lateral mass screws at C3-C4-C5 and pars screws at C2 lies in the globus ellipseLateral mass screw system 3. Posterior lateral  arthrodesis C2-C5 utilizing locally harvested autograft mixed with vivigen  06/22/2018: Procedure reexploration posterior cervical wound for evacuation of epidural hematoma  06/26/2018 DVT ultrasound:Right: Findings consistent with acute deep vein thrombosis involving the right common femoral vein, right  femoral vein, right popliteal vein, right posterior tibial vein, and right peroneal vein. Thrombosis extends proximally into external iliac vein.  Proximal iliac vein not visualized. Left: Findings consistent with acute deep vein thrombosis involving the left common femoral vein, left femoral vein, left popliteal vein, left posterior tibial vein, and left peroneal vein. Findings consistent with acute superficial vein thrombosis  involving the left great saphenous vein. Thrombosis extends proximally into external iliac vein. Proximal iliac vein not visualized.  07/01/18 Clot evacuation   RUE doppler 07/02/18    Antimicrobials:  Completed course of Ancef  Subjective: Patient denies any new complaints this morning.  Overall he seems to be in better spirits.  Continues to have weakness on the left side.  No further episodes of diarrhea.  Imodium is helping.  Objective: Vitals:   07/31/18 2300 08/01/18 0400 08/01/18 0800 08/01/18 0900  BP:  130/89 121/86 (!) 129/95  Pulse: 96 98 93 96  Resp: _0 Temp: 99.7 F (37.6 C) 98.8 F (37.1 C)  98.5 F (36.9 C)  TempSrc: Oral Oral  Oral  SpO2: 99% 99% 98% 99%  Weight:      Height:        Intake/Output Summary (Last 24 hours) at 08/01/2018 1055 Last data filed at 08/01/2018 0900 Gross per 24 hour  Intake 2095.05 ml  Output 1300 ml  Net 795.05 ml   Filed Weights   07/07/18 1000 07/18/18 0600 07/27/18 1502  Weight: 123.8 kg 135 kg 127.6 kg    Examination:  General exam: He is awake alert.  In no distress. Respiratory system: Normal effort at rest.  Clear to auscultation bilaterally Cardiovascular system: S1-S2 is normal regular.  No S3-S4.  No rubs murmurs or bruit Gastrointestinal system: Abdomen is soft.  Nontender nondistended.  Bowel sounds are present normal.  No masses organomegaly Improved edema bilateral upper and lower extremities.    Data Reviewed: I have personally reviewed following labs and imaging  studies  CBC: Recent Labs  Lab 07/26/18 0600  07/28/18 0139 07/29/18 0242 07/30/18 0719 07/31/18 0440 08/01/18 0440  WBC 4.2   < > 5.1 4.8 4.4 5.2 5.5  NEUTROABS 2.6  --   --   --   --   --   --   HGB 9.8*   < > 9.7* 9.1* 9.2* 9.6* 9.4*  HCT 31.9*   < > 29.8* 29.5* 29.9* 31.5* 30.4*  MCV 97.0   < > 94.6 97.4 98.7 97.5 96.8  PLT 27*   < > 58* 61* 63* 85* 112*   < > = values in this interval not displayed.   Basic Metabolic Panel: Recent Labs  Lab 07/26/18 0600 07/27/18 0430 07/29/18 0242 07/31/18 0440  NA 142 142 139 142  K 3.2* 3.6 3.7 3.9  CL 102 103 103 105  CO2 34* _1 GLUCOSE 164* 280* 375* 227*  BUN 57* 51* 52* 37*  CREATININE 0.74 0.65 0.95 0.64  CALCIUM 9.3 9.4 9.0 9.3  MG  --  2.0  --   --    GFR: Estimated Creatinine Clearance: 142.8 mL/min (by C-G formula based on SCr of 0.64 mg/dL).  Coagulation Profile: Recent Labs  Lab 07/27/18 0430  INR 1.25   CBG:  Recent Labs  Lab 07/31/18 0738 07/31/18 1156 07/31/18 1618 07/31/18 2112 08/01/18 0726  GLUCAP 209* 239* 308* 331* 158*      Radiology Studies: No results found.      Scheduled Meds: . clonazePAM  0.5 mg Oral BID  . dexamethasone  4 mg Intravenous Q24H  . feeding supplement (ENSURE ENLIVE)  237 mL Oral QID  . furosemide  40 mg Intravenous Daily  . gabapentin  300 mg Oral TID  . Gerhardt's butt cream   Topical BID  . Influenza vac split quadrivalent PF  0.5 mL Intramuscular Tomorrow-1000  . insulin aspart  0-15 Units Subcutaneous TID WC  . insulin aspart  0-5 Units Subcutaneous QHS  . insulin glargine  35 Units Subcutaneous QHS  . magic mouthwash w/lidocaine  5 mL Oral TID  . methocarbamol  750 mg Oral QID  . midodrine  5 mg Oral BID WC  . multivitamin with minerals  1 tablet Oral Daily  . nystatin  5 mL Oral TID AC & HS  . romiPLOStim  250 mcg Subcutaneous Q Thu  . sodium chloride flush  10-40 mL Intracatheter Q12H   Continuous Infusions: . sodium chloride 10 mL/hr at  07/25/18 2300  . bivalirudin (ANGIOMAX) infusion 0.5 mg/mL (Non-ACS indications) 0.17 mg/kg/hr (08/01/18 0800)     LOS: 55 days     Bonnielee Haff, MD [facility] Pager [phone]  If 7PM-7AM, please contact night-coverage [email or web address] Password [email or web address] 08/01/2018, 10:55 AM

## 2018-08-01 NOTE — Progress Notes (Signed)
ANTICOAGULATION CONSULT NOTE -  Consult follow-up Note   Pharmacy Consult for Bivaliruidin Indication: pulmonary embolus and bilateral DVT  Allergies  Allergen Reactions  . Nsaids Other (See Comments)    G6PD DEFICIENCY  . Sulfa Antibiotics Other (See Comments)    G6PD DEFICIENCY  . Protonix [Pantoprazole Sodium] Diarrhea    Severe diarrhea, Upset stomach    Patient Measurements: Height: 5\' 9"  (175.3 cm) Weight: 281 lb 4.9 oz (127.6 kg) IBW/kg (Calculated) : 70.7 Bivalirudin Dosing Weight: 127.6 kg  Vital Signs: Temp: 97.9 F (36.6 C) (11/19 1145) Temp Source: Oral (11/19 1145) BP: 127/90 (11/19 1145) Pulse Rate: 92 (11/19 1145)  Labs: Recent Labs    07/30/18 0719 07/31/18 0440  08/01/18 0440 08/01/18 1233 08/01/18 1620  HGB 9.2* 9.6*  --  9.4*  --   --   HCT 29.9* 31.5*  --  30.4*  --   --   PLT 63* 85*  --  112*  --   --   APTT  --  47*   < > 49* 54* 55*  CREATININE  --  0.64  --   --   --   --    < > = values in this interval not displayed.    Estimated Creatinine Clearance: 142.8 mL/min (by C-G formula based on SCr of 0.64 mg/dL).  Assessment:  52 yr old male with new PE per 11/13 CTA and bilateral DVT per duplex today, and thrombocytpenia. Has IVC filter.  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  Last MRI (11/10) did not show evidence of compressive hematoma.  Hx PE and DVT and was on Eliquis prior to admit. Dr. Myna HidalgoEnnever and Dr. Wynetta Emeryram discussed risk/benefit and to begin Bivalirudin with low therapeutic goal.  Previously on Argatroban 10/22>10/23 then Bivalirudin 10/23>10/25. Noted switched due to Argatroban possibly contributing to bradycardia and hypotension. Pharmacy consulted to restart Bivalirudin on 11/14.  APTT slightly subtherapeutic again this AM at 49 seconds on Bivalirudin at 0.17 mg/kg.hr. No active bleeding or issues with infusion reported. Hgb low but stable, plt improved to 112, LDH up to 451.   Bivalirudin was increased from 0.17>0.19 mg/kg/hr ~11am.   aPTTs this afternoon 54 then 55 seconds on increased rate. Two therapeutic values about 4 hours apart, at low goal.  Goal of Therapy:  aPTT 50-67 seconds (lower end of usual range 50-85 seconds) seconds Monitor platelets by anticoagulation protocol: Yes   Plan:  Continue Bivalirudin at 0.19 mg/kg/hr Daily aPTT and CBC. Monitor for any signs or symptoms of bleeding F/u Oncology plans - Bivalirudin x 3 more days, then transition to DOAC per note  Dennie Fettersgan, Lula Michaux Donovan, RPh Pager: [phone] 08/01/2018 5:20 PM

## 2018-08-01 NOTE — Progress Notes (Addendum)
ANTICOAGULATION CONSULT NOTE -  Consult follow-up Note   Pharmacy Consult for Bivaliruidin Indication: pulmonary embolus and bilateral DVT  Allergies  Allergen Reactions  . Nsaids Other (See Comments)    G6PD DEFICIENCY  . Sulfa Antibiotics Other (See Comments)    G6PD DEFICIENCY  . Protonix [Pantoprazole Sodium] Diarrhea    Severe diarrhea, Upset stomach    Patient Measurements: Height: 5\' 9"  (175.3 cm) Weight: 281 lb 4.9 oz (127.6 kg) IBW/kg (Calculated) : 70.7 Bivalirudin Dosing Weight: 127.6 kg  Vital Signs: Temp: 98.5 F (36.9 C) (11/19 0900) Temp Source: Oral (11/19 0900) BP: 129/95 (11/19 0900) Pulse Rate: 96 (11/19 0900)  Labs: Recent Labs    07/30/18 0719 07/31/18 0440  07/31/18 1831 08/01/18 0440 08/01/18 1233  HGB 9.2* 9.6*  --   --  9.4*  --   HCT 29.9* 31.5*  --   --  30.4*  --   PLT 63* 85*  --   --  112*  --   APTT  --  47*   < > 53* 49* 54*  CREATININE  --  0.64  --   --   --   --    < > = values in this interval not displayed.    Estimated Creatinine Clearance: 142.8 mL/min (by C-G formula based on SCr of 0.64 mg/dL).  Assessment:  52 yr old male with new PE per 11/13 CTA and bilateral DVT per duplex today, and thrombocytpenia. Has IVC filter.  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  Last MRI (11/10) did not show evidence of compressive hematoma.  Hx PE and DVT and was on Eliquis prior to admit. Dr. Myna HidalgoEnnever and Dr. Wynetta Emeryram discussed risk/benefit and to begin Bivalirudin with low therapeutic goal.  Previously on Argatroban 10/22>10/23 then Bivalirudin 10/23>10/25. Noted switched due to Argatroban possibly contributing to bradycardia and hypotension. Pharmacy has been consulted to restart Bivalirudin.   APTT slightly subtherapeutic again this AM at 49 secs. No active bleeding or issues with infusion per discussion with RN. Hg low but stable, plt improved to 112, LDH up to 451.  Goal of Therapy:  aPTT  50-67 seconds (lower end of usual range 50-85 seconds) seconds Monitor platelets by anticoagulation protocol: Yes   Plan:  Continue Bivalirudin at 0.19 mg/kg/hr 2h APTT to confirm Daily aPTT/CBC Monitor for any signs or symptoms of bleeding F/u Oncology plans - Bivalirudin x 3 more days, then transition to DOAC per note  Babs BertinHaley Whalen Trompeter, PharmD, BCPS Clinical Pharmacist Clinical phone [phone] Please check AMION for all [facility] contact numbers 08/01/2018 2:26 PM

## 2018-08-01 NOTE — Progress Notes (Signed)
Now, the platelet count is 112,000.  Still not sure as to why his platelet count is gone back up now.  I cannot imagine that this is related to him being on the bivalirudin.  His white cell count is stable.  His hemoglobin is stable.  His LDH is 451.  His PTT is 49.  This is from the bivalirudin.  I feel much better now about being able to discontinue the bivalirudin at the end of the week.  I still do not have an obvious answer as to why the platelet count was down so low.  It seems like he had some type of platelet consumptive issue.  This seems to have "run its course."  He has had no bleeding.  He is eating better.  I do not see any obvious swelling in his arms or legs.  Neurologically he has had no change.  Hopefully, he will be able to do some more in the way of physical therapy.  I am just happy that the platelet count is trending back up to where it needs to be.  Hopefully, this will be a good indicator that would ever coagulopathy that he developed is also resolving.  Again I still would keep him on the bivalirudin infusion for another 3 days.  If everything is still looking stable, then I would switch him over to Xarelto or Eliquis.  As far as I can tell, he was not on any type of heparin.  This would be the only other possibility that I could think of was that he was somehow getting heparin and he has heparin-induced thrombocytopenia (HITT).  Christin BachPete Ennever, MD  [name] HeckIsaiah 41:10

## 2018-08-01 NOTE — Progress Notes (Signed)
ANTICOAGULATION CONSULT NOTE -  Consult follow-up Note   Pharmacy Consult for Bivaliruidin Indication: pulmonary embolus and bilateral DVT  Allergies  Allergen Reactions  . Nsaids Other (See Comments)    G6PD DEFICIENCY  . Sulfa Antibiotics Other (See Comments)    G6PD DEFICIENCY  . Protonix [Pantoprazole Sodium] Diarrhea    Severe diarrhea, Upset stomach    Patient Measurements: Height: 5\' 9"  (175.3 cm) Weight: 281 lb 4.9 oz (127.6 kg) IBW/kg (Calculated) : 70.7 Bivalirudin Dosing Weight: 127.6 kg  Vital Signs: Temp: 98.5 F (36.9 C) (11/19 0900) Temp Source: Oral (11/19 0900) BP: 129/95 (11/19 0900) Pulse Rate: 96 (11/19 0900)  Labs: Recent Labs    07/30/18 0719 07/31/18 0440  07/31/18 1623 07/31/18 1831 08/01/18 0440  HGB 9.2* 9.6*  --   --   --  9.4*  HCT 29.9* 31.5*  --   --   --  30.4*  PLT 63* 85*  --   --   --  112*  APTT  --  47*   < > 128* 53* 49*  CREATININE  --  0.64  --   --   --   --    < > = values in this interval not displayed.    Estimated Creatinine Clearance: 142.8 mL/min (by C-G formula based on SCr of 0.64 mg/dL).  Assessment:  52 yr old male with new PE per 11/13 CTA and bilateral DVT per duplex today, and thrombocytpenia. Has IVC filter.  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  Last MRI (11/10) did not show evidence of compressive hematoma.  Hx PE and DVT and was on Eliquis prior to admit. Dr. Myna HidalgoEnnever and Dr. Wynetta Emeryram discussed risk/benefit and to begin Bivalirudin with low therapeutic goal.  Previously on Argatroban 10/22>10/23 then Bivalirudin 10/23>10/25. Noted switched due to Argatroban possibly contributing to bradycardia and hypotension. Pharmacy has been consulted to restart Bivalirudin.   APTT slightly subtherapeutic again this AM at 49 secs. No active bleeding or issues with infusion per discussion with RN. Hg low but stable, plt improved to 112, LDH up to 451.  Goal of Therapy:  aPTT  50-67 seconds (lower end of usual range 50-85 seconds) seconds Monitor platelets by anticoagulation protocol: Yes   Plan:  Increase Bivalirudin to 0.19 mg/kg/hr - conservative increase with bleeding history 2h APTT Daily aPTT/CBC Monitor for any signs or symptoms of bleeding F/u Oncology plans - Bivalirudin x 3 more days, then transition to DOAC per note  Babs BertinHaley Cambren Helm, PharmD, BCPS Clinical Pharmacist Clinical phone [phone] Please check AMION for all [facility] contact numbers 08/01/2018 9:19 AM

## 2018-08-01 NOTE — Progress Notes (Signed)
Inpatient Diabetes Program Recommendations  AACE/ADA: New Consensus Statement on Inpatient Glycemic Control (2019)  Target Ranges:  Prepandial:   less than 140 mg/dL      Peak postprandial:   less than 180 mg/dL (1-2 hours)      Critically ill patients:  140 - 180 mg/dL  Results for [name] [name], [name] [name] (MRN [id number]) as of 08/01/2018 09:57  Ref. Range 07/31/2018 07:38 07/31/2018 11:56 07/31/2018 16:18 07/31/2018 21:12 08/01/2018 07:26  Glucose-Capillary Latest Ref Range: 70 - 99 mg/dL 621209 (H)  Novolog 5 units 239 (H)  Novolog 5 units 308 (H)  Novolog 11 units 331 (H)  Novolog 5 units  Lantus 35 units 158 (H)  Novolog 3 units   Results for [name] [name], [name] [name] (MRN [id number]) as of 07/31/2018 13:07  Ref. Range 07/30/2018 07:38 07/30/2018 11:16 07/30/2018 15:24 07/30/2018 21:09  Glucose-Capillary Latest Ref Range: 70 - 99 mg/dL 962333 (H) 952345 (H) 841318 (H) 266 (H)   Review of Glycemic Control  Diabetes history: No Outpatient Diabetes medications: NA Current orders for Inpatient glycemic control: Lantus 35 units QHS, Novolog 0-15 units TID with meals, Novolog 0-5 units QHS; Decadron 4 mg Q24H  Inpatient Diabetes Program Recommendations:   Insulin - Meal Coverage: Per chart review, appears patient is not eating meals but is receiving supplements QID.  If patient is drinking supplements, may need to order Novolog 5 units QID scheduled along with timing of Ensure Enlive supplements.  Thanks, Orlando PennerMarie Sarita Hakanson, RN, MSN, CDE Diabetes Coordinator Inpatient Diabetes Program [phone] (Team Pager from 8am to 5pm)

## 2018-08-01 NOTE — Progress Notes (Signed)
Physical Therapy Treatment Patient Details Name: [name] [name] MRN: [id number] DOB: June 16, 1966 Today's Date: 08/01/2018    History of Present Illness 52 y.o. male admitted on 06/06/18 for bil LE weakness and muscle spasm after multiple falls (with resultant R knee meniscus tear placed in bledsoe brace) following a recent ACDF at C3 in July 2019 (complicated by PE (s/p IVC filter), CIR, and then SNF placement before finally returning home with his parents).  In the ED MRI of c-spine which showed post operative changes and cord edema and possibility of ischemia.  His R knee was aspirated on 06/09/18.  S/p posterior cervical decompression and fusion on 06/14/18. Entire acute course complicated by hypotension in standing (thought to be autonomic) and by finding of multiple LE DVTs (already has an IVC filter in place, but per MDs notes at risk for blocking the filter with these clots).  Pt with post op issues with incisional bleeding s/p 2 evacuations of hematomas and placement of hemovac (06/22/18 and 07/01/18) as well as multiple units of plasma.  Cardiology consulted 07/05/18 due to bradycardia thought to be from his newly started blood thinner.  Pt with another re-accumulation of epidural hematoma with increased L sided weakness and cord compression with emergent evacuation surgery 10/25 & 07/10/18.  Pt also needing more platelets post op.  Bone marrow bx 07/12/18.  Pt found to have submassive PE on 07/27/18 and re-started on anticoagulation. PMHx of HTN, gout, anemia, DDD (lumbar)    PT Comments    Pt frustrated initially when PT entered his room, but was able to calm him and he was agreeable to peri care (had a BM and often does as I mobilize his body and his bowels move as well).  He was agreeable to lift OOB to chair and is excited about having a more consistent OT as well as PT.  He continues to be concerned about his bottom sores, but did not want to give up his upright in bed time (as he is in  better control of his phone, call bell when fully upright, but his bottom continues to develop pressure sores.  He is requesting a wedge (ICU side has wedges, I will see if I can borrow one) and egg crate foam (like the yellow ones from the OR/PACU).  PT will work on getting some of these things he needs for comfort.   Follow Up Recommendations  SNF     Equipment Recommendations  Hospital bed;Wheelchair (measurements PT);Wheelchair cushion (measurements PT);Other (comment)(22x20 power WC tilt in space, hoyer lift)    Recommendations for Other Services   NA     Precautions / Restrictions Precautions Precautions: Fall;Cervical;Other (comment) Precaution Comments: Several bed sores on bottom.   Required Braces or Orthoses: Cervical Brace Cervical Brace: Soft collar    Mobility  Bed Mobility Overal bed mobility: Needs Assistance Bed Mobility: Rolling Rolling: +2 for physical assistance;Total assist         General bed mobility comments: Two person total assist to roll to his right side.  Pt able to intiate head and upper shoulder rotation to help bring his torso over to side lying.  Total assist to manage single leg flexion to roll.   Transfers Overall transfer level: Needs assistance               General transfer comment: Maxi move used for OOB time, pt's seat of wide recliner padded with as much pressure relief cushioning that I had (two pillows, geo mat, egg crates.  Balance       Sitting balance - Comments: Offered to assist pt in sitting EOB, but he was a bit frustrated re: laying on his side with bed flat for >2 hours, he lost his call bell and he could not get anyone to help him even yelling and whistling out into the hallway.                                      Cognition Arousal/Alertness: Awake/alert Behavior During Therapy: WFL for tasks assessed/performed Overall Cognitive Status: Within Functional Limits for tasks assessed                                                Pertinent Vitals/Pain Pain Assessment: Faces Faces Pain Scale: Hurts little more Pain Location: posterior neck Pain Descriptors / Indicators: Aching Pain Intervention(s): Limited activity within patient's tolerance;Monitored during session;Repositioned           PT Goals (current goals can now be found in the care plan section) Acute Rehab PT Goals Patient Stated Goal: to get a positioning wedge Progress towards PT goals: Progressing toward goals    Frequency    Min 2X/week      PT Plan Current plan remains appropriate       AM-PAC PT "6 Clicks" Daily Activity  Outcome Measure  Difficulty turning over in bed (including adjusting bedclothes, sheets and blankets)?: Unable Difficulty moving from lying on back to sitting on the side of the bed? : Unable Difficulty sitting down on and standing up from a chair with arms (e.g., wheelchair, bedside commode, etc,.)?: Unable Help needed moving to and from a bed to chair (including a wheelchair)?: Total Help needed walking in hospital room?: Total Help needed climbing 3-5 steps with a railing? : Total 6 Click Score: 6    End of Session Equipment Utilized During Treatment: Cervical collar Activity Tolerance: Patient tolerated treatment well Patient left: in chair;with call bell/phone within reach Nurse Communication: Mobility status PT Visit Diagnosis: Other abnormalities of gait and mobility (R26.89);Other symptoms and signs involving the nervous system (R29.898);Muscle weakness (generalized) (M62.81)     Time: [id number] PT Time Calculation (min) (ACUTE ONLY): 75 min  Charges:  $Therapeutic Activity: 68-82 mins          Javonte Elenes B. Glenys Snader, PT, DPT  Acute Rehabilitation [phone] pager #[phone]) [phone] office            08/01/2018, 4:29 PM

## 2018-08-01 NOTE — Progress Notes (Signed)
Occupational Therapy Treatment Patient Details Name: [name] [name] MRN: [id number] DOB: 1965-10-05 Today's Date: 08/01/2018    History of present illness 52 y.o. male admitted on 06/06/18 for bil LE weakness and muscle spasm after multiple falls (with resultant R knee meniscus tear placed in bledsoe brace) following a recent ACDF at C3 in July 2019 (complicated by PE (s/p IVC filter), CIR, and then SNF placement before finally returning home with his parents).  In the ED MRI of c-spine which showed post operative changes and cord edema and possibility of ischemia.  His R knee was aspirated on 06/09/18.  S/p posterior cervical decompression and fusion on 06/14/18. Entire acute course complicated by hypotension in standing (thought to be autonomic) and by finding of multiple LE DVTs (already has an IVC filter in place, but per MDs notes at risk for blocking the filter with these clots).  Pt with post op issues with incisional bleeding s/p 2 evacuations of hematomas and placement of hemovac (06/22/18 and 07/01/18) as well as multiple units of plasma.  Cardiology consulted 07/05/18 due to bradycardia thought to be from his newly started blood thinner.  Pt with another re-accumulation of epidural hematoma with increased L sided weakness and cord compression with emergent evacuation surgery 10/25 & 07/10/18.  Pt also needing more platelets post op.  Bone marrow bx 07/12/18.  Pt found to have submassive PE on 07/27/18 and re-started on anticoagulation. PMHx of HTN, gout, anemia, DDD (lumbar)   OT comments  Pt making slow improvements now feeding with RUE and drinking out of a cup without assist.  Pt educated at length about taking care of pressure relief and making sure he is calling to be turned.  Pt likes to sit up but did educate on need to be on his side completely for short amounts of time to really take care of his skin.    Pt vented at how he would like more consistent care from therapy.  PT has been  great about attempting to send in same therapist at all times. Will talk to OT team to see if this is possible. He agreed having the same 2 therapists would be reasonable. Pt has very little control over anything right now and this seems like a reasonable thing to give him control over.    Pt with movement in the LUE as well but not functional at this time. Cont to rec SNF level rehab at this time.   Follow Up Recommendations  SNF;Supervision/Assistance - 24 hour    Equipment Recommendations  Other (comment)(tbd)    Recommendations for Other Services      Precautions / Restrictions Precautions Precautions: Fall;Cervical;Other (comment) Precaution Comments: Several bed sores on bottom.   Required Braces or Orthoses: Cervical Brace Knee Immobilizer - Right: On when out of bed or walking Cervical Brace: Soft collar Restrictions Weight Bearing Restrictions: No       Mobility Bed Mobility Overal bed mobility: Needs Assistance Bed Mobility: Rolling Rolling: +2 for physical assistance;Total assist         General bed mobility comments: Two person total assist to roll for peri care bil.    Transfers                 General transfer comment: pt unable to tolerate out of bed or EOB at this time.  Pt very weak.  Pt with sores on bottom.  Need to limit time in chair.     Balance  ADL either performed or assessed with clinical judgement   ADL Overall ADL's : Needs assistance/impaired Eating/Feeding: Set up;Sitting;With adaptive utensils   Grooming: Set up;Supervision/safety;Bed level   Upper Body Bathing: Maximal assistance;Bed level   Lower Body Bathing: Maximal assistance;Bed level Lower Body Bathing Details (indicate cue type and reason): Pt too weak to get to EOB or stand Upper Body Dressing : Maximal assistance;Sitting   Lower Body Dressing: Total assistance   Toilet Transfer: Total assistance Toilet  Transfer Details (indicate cue type and reason): Maxi lift to recliner Toileting- Clothing Manipulation and Hygiene: Total assistance;Bed level Toileting - Clothing Manipulation Details (indicate cue type and reason): Total A for toilet hygiene after bowel incontience in bed. Total A for rolling in bed     Functional mobility during ADLs: Total assistance General ADL Comments: Pt rolled side to side with max assist to be cleaned after bowel movment.  Worked on hooking arm over bed rail to attempt to hold self in place.       Vision   Vision Assessment?: No apparent visual deficits   Perception     Praxis      Cognition Arousal/Alertness: Awake/alert Behavior During Therapy: WFL for tasks assessed/performed Overall Cognitive Status: Within Functional Limits for tasks assessed                                          Exercises Exercises: Other exercises General Exercises - Upper Extremity Shoulder Flexion: AAROM;PROM;Right;Left;10 reps Shoulder ABduction: PROM;Both;10 reps Shoulder Horizontal ABduction: PROM;5 reps;Left;Supine Elbow Flexion: AAROM;PROM;Right;Left;10 reps Elbow Extension: PROM;AAROM;Right;Left;20 reps Wrist Flexion: PROM;AAROM;20 reps;Right;Left Wrist Extension: AAROM;PROM;Right;Left;10 reps Digit Composite Flexion: PROM;AAROM;Right;Left;20 reps Composite Extension: PROM;AAROM;Right;Left   Shoulder Instructions       General Comments Gave pt plenty of time to vent. Pt very frustrated with entire hospital stay(s).  Feels he would like better communication between the team members caring for him.  Talked to him about attempting to get the same therapist (or two) to be assigned to him so he has some consisteny. This is a pt that has so little control over anything right now that giving him control of this is reasonable. Will talk to team about this.      Pertinent Vitals/ Pain       Pain Assessment: No/denies pain  Home Living                                           Prior Functioning/Environment              Frequency  Min 2X/week        Progress Toward Goals  OT Goals(current goals can now be found in the care plan section)  Progress towards OT goals: Progressing toward goals  Acute Rehab OT Goals Patient Stated Goal: to get a positioning wedge OT Goal Formulation: With patient Time For Goal Achievement: 08/22/18 Potential to Achieve Goals: Fair ADL Goals Pt Will Perform Eating: with modified independence;with adaptive utensils;sitting Pt Will Perform Grooming: with min assist;with adaptive equipment;sitting Pt Will Perform Upper Body Dressing: with mod assist;sitting Pt Will Perform Lower Body Dressing: with mod assist;sitting/lateral leans;with adaptive equipment Pt Will Transfer to Toilet: with max assist;with transfer board;bedside commode Pt Will Perform Toileting - Clothing Manipulation and hygiene: with mod assist;sitting/lateral leans;sit to/from  stand Pt/caregiver will Perform Home Exercise Program: Increased ROM;Increased strength;Both right and left upper extremity;With theraputty;With written HEP provided Additional ADL Goal #1: Pt will perform bed mobility with modA as precursor to EOB/OOB activity. Additional ADL Goal #2: Pt will maintain static sitting balance at EOB with Min A in preparation for ADLs  Plan Discharge plan remains appropriate    Co-evaluation                 AM-PAC PT "6 Clicks" Daily Activity     Outcome Measure   Help from another person eating meals?: A Little Help from another person taking care of personal grooming?: A Little Help from another person toileting, which includes using toliet, bedpan, or urinal?: Total Help from another person bathing (including washing, rinsing, drying)?: Total Help from another person to put on and taking off regular upper body clothing?: Total Help from another person to put on and taking off regular lower body  clothing?: Total 6 Click Score: 10    End of Session Equipment Utilized During Treatment: Cervical collar  OT Visit Diagnosis: Other abnormalities of gait and mobility (R26.89);Muscle weakness (generalized) (M62.81);Pain   Activity Tolerance Patient tolerated treatment well   Patient Left with call bell/phone within reach;in chair   Nurse Communication Mobility status;Need for lift equipment        Time: 1120-1210 OT Time Calculation (min): 50 min  Charges: OT General Charges $OT Visit: 1 Visit OT Treatments $Self Care/Home Management : 8-22 mins $Therapeutic Exercise: 23-37 mins  Tory Emerald, OTR/L [phone]   Hope Budds 08/01/2018, 12:41 PM

## 2018-08-02 LAB — CBC
HEMATOCRIT: 30.2 % — AB (ref 39.0–52.0)
HEMOGLOBIN: 9.1 g/dL — AB (ref 13.0–17.0)
MCH: 29.8 pg (ref 26.0–34.0)
MCHC: 30.1 g/dL (ref 30.0–36.0)
MCV: 99 fL (ref 80.0–100.0)
NRBC: 4 % — AB (ref 0.0–0.2)
Platelets: 133 10*3/uL — ABNORMAL LOW (ref 150–400)
RBC: 3.05 MIL/uL — ABNORMAL LOW (ref 4.22–5.81)
RDW: 17.2 % — AB (ref 11.5–15.5)
WBC: 6.1 10*3/uL (ref 4.0–10.5)

## 2018-08-02 LAB — LACTATE DEHYDROGENASE: LDH: 500 U/L — ABNORMAL HIGH (ref 98–192)

## 2018-08-02 LAB — APTT
APTT: 43 s — AB (ref 24–36)
aPTT: 49 seconds — ABNORMAL HIGH (ref 24–36)
aPTT: 58 seconds — ABNORMAL HIGH (ref 24–36)
aPTT: 54 seconds — ABNORMAL HIGH (ref 24–36)

## 2018-08-02 LAB — GLUCOSE, CAPILLARY
GLUCOSE-CAPILLARY: 307 mg/dL — AB (ref 70–99)
GLUCOSE-CAPILLARY: 399 mg/dL — AB (ref 70–99)
Glucose-Capillary: 195 mg/dL — ABNORMAL HIGH (ref 70–99)
Glucose-Capillary: 265 mg/dL — ABNORMAL HIGH (ref 70–99)

## 2018-08-02 LAB — BASIC METABOLIC PANEL
Anion gap: 11 (ref 5–15)
BUN: 34 mg/dL — ABNORMAL HIGH (ref 6–20)
CALCIUM: 9 mg/dL (ref 8.9–10.3)
CHLORIDE: 104 mmol/L (ref 98–111)
CO2: 25 mmol/L (ref 22–32)
Creatinine, Ser: 0.72 mg/dL (ref 0.61–1.24)
GFR calc non Af Amer: >60 mL/min (ref >60)
GLUCOSE: 214 mg/dL — AB (ref 70–99)
Potassium: 4 mmol/L (ref 3.5–5.1)
Sodium: 140 mmol/L (ref 135–145)

## 2018-08-02 MED ORDER — CHLORHEXIDINE GLUCONATE CLOTH 2 % EX PADS
6.0000 | MEDICATED_PAD | Freq: Every day | CUTANEOUS | Status: DC
  Administered 2018-08-02 – 2018-08-25 (×15): 6 via TOPICAL

## 2018-08-02 MED ORDER — INSULIN ASPART 100 UNIT/ML ~~LOC~~ SOLN
3.0000 [IU] | Freq: Three times a day (TID) | SUBCUTANEOUS | Status: DC
  Administered 2018-08-02 – 2018-08-05 (×7): 3 [IU] via SUBCUTANEOUS

## 2018-08-02 NOTE — Progress Notes (Signed)
PROGRESS NOTE    [name] [name]  VEL:[id number] DOB: 25-Oct-1965 DOA: 06/06/2018 PCP: Helane Rima, MD    Brief Narrative: 52 year old man with past medical history relevant for gout, hypertension anterior cervical corpectomy of C4 on 03/24/2018 with comp gated postoperative course including development of multiple PEs and DVTs started on anticoagulation and subsequently developed hematoma requiring reexploration on 03/28/2018 status post placement of IVC filter who was discharged to skilled nursing facility and had a complicated course including progressive lower extremity weakness, intermittent fevers of unclear etiology and hospitalizations for "pneumonia". Patient was admitted to this hospital is a transfer from [facility] with MRI of C-spine that showed postoperative changes and cord compression with cord edema and concern for ischemia. Patient is status post posterior cervical decompression laminectomy of C3, C4, C5, removal of spinous processes, foraminotomies on 06/14/2018. On 06/22/2018 patient began to develop worsening left sided paresthesias and weakness and a stat MRI showed evidence of postoperative hematoma. Patient was taken to the OR with evacuation of hematoma. Additionally his postoperative course has been complicated by recurrent fevers,bilateral lower extremity DVTs on ultrasound,migratory arthralgias/arthritis that are thought to be rheumatologic in nature as well as significant orthostasis limiting the amount he can work with physical therapy. His arthralgia has resolved.   Noted to have decreased left hand grip on 10/19, MRI showed large epidural hematoma, patient s/p clot evacuation.  He was place back on anticoagulation argatroban on 10-22. He was notice to be more bradycardic with HR in the 40 on 10-23. Cardiology was consulted, and argatroban was transition to bivalirrubin to avoid bradycardia. His HR improved after argatroban was discontinue. -HIT panel0.486  (mildly elevated)- SRA (serotonin release assay) negative. Dr Marin Olp has been helping with patient care.   Patient developed weakness of left arm on 10-25. He was take emergently to OR for recurrence of epidural hematoma on 10-25.  He was taken off of anticoagulation.  Patient the night of 10-28 develops left arm weakness, numbness. Stat MRI was ordered, which confirm recurrent hematoma. Patient was taken to OR for hematoma evacuation 10-28. For his thrombocytopenia, a Bone marrow biopsy was done. For LE DVT, he has IVC filter. Patient has received multiple platelet transfusions and Nplate.  Patient underwent CT scan of his chest on 11/13 which showed new pulmonary embolism.  Patient was started on bivalirudin after discussions between hematology as well as neurosurgery.  Platelet counts appear to be improving.   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  Status post cervical decompressive surgery on 06/14/2018 Status post evacuation of large hematoma and placement of Hemovac drain on 06/22/2018. Patient with decreased left sided handgrip and weakness since 10/19. MRI of the C-spine shows large epidural hemo-following which he had several clot evacuation procedures done on 10/19, 10/35, 10/28.  Repeat MRI on 07/23/2018 shows improvement in the fluid collection.  Neurosurgery continues to follow and they plan to taper steroids.  Discussed with Dr. Saintclair Halsted  who plans to refer the patient to spine rehab center in [place]. Acute pulmonary embolism/DVT in the setting of spine surgery Status post IVC filter placement on March 28, 2018 patient has been on multiple anticoagulants which has led to recurrent episodes of hematomas at the surgical sites requiring further evacuations.  Case was discussed with vascular surgery Dr. Lonni Fix, interventional radiologist they recommended that mechanical thrombectomy and thrombolysis is not on the cards.  He was kept off anticoagulation for a brief.  Due to recurrent bleeding.  However a CAT scan on the November 13 showed new pulmonary embolism with evidence of right heart.  He was subsequently started on bivalrudin.  So far he has no signs of bleeding.  And platelet counts are improving.  Plan to continue current management until the end of the week and PCP can be transitioned to oral anticoagulation on discharge.     Thrombocytopenia/coagulopathy Patient had extensive work-up for acute thrombocytopenia.  He underwent blood work showing cardiolipin antibody lupus anticoagulant, homocysteine levels factor VIII levels all within normal limits.  Protein S activity was low, protein C activity was greater than 200.  HIT panel was mildly elevated but serotonin release assay was negative.  Over the course of his hospitalization patient had multiple platelet transfusions and his platelet count has slowly increased endplate infusions on weekly basis. Hyperglycemia secondary to steroids Currently on Lantus and sliding scale insulin. Hemoglobin A1c was 5.8 when last checked. NovoLog 3 units 3 times daily AC was added CBGs greater than 250 to be given when he was getting Ensure supplements.    Anemia of chronic disease he did get 1 unit of PRBC transfusion during this hospitalization. Hemoglobin has been stable thereafter.   Orthostatic hypotension with near syncope Thought to be secondary to hypovolemia not autonomic  dysfunction. Echocardiogram shows grade 2 diastolic dysfunction. Midodrine was added and his blood pressure parameters have been within normal limits.    Sinus bradycardia Is currently asymptomatic, TSH was within normal limits.  Plan to avoid AV nodal blocking agents.    Polyarthralgias Resolved   Fluid overload with upper and lower extremity edema patient has diuresed used up to 19 liters since admission with IV Lasix.  Adjustment disorder with depression and anxiety Resume Klonopin as needed.    Essential hypertension well controlled    Acute renal failure Resolved    Stage II pressure ulcer on the right ischial tuberosity Wound care consulted and recommendations given.    Oropharyngeal candidiasis He was given a course of fluconazole.   Acute diarrhea Resolved  DVT prophylaxis: ted stockings.  Code Status:  Full code Family Communication: none at bedside.  Disposition Plan: pending neuro surgery recommendations.   Consultants:   NS Dr Saintclair Halsted  Hematology  Dr Marin Olp.   Cardiology.    Procedures:  On 10/2 1. posterior cervical decompressive laminectomy at C3, C4, C5 with removal of the spinous process and complete laminectomy and foraminotomies of the C3, C4, C5 nerve roots. 2. Posterior cervical fusion with lateral mass screws at C3-C4-C5 and pars screws at C2 lies in the globus ellipseLateral mass screw system 3. Posterior lateral arthrodesis C2-C5 utilizing locally harvested autograft mixed with vivigen  06/22/2018: Procedure reexploration posterior cervical wound for evacuation of epidural hematoma  06/26/2018 DVT ultrasound:Right: Findings consistent with acute deep vein thrombosis involving the right common femoral vein, right femoral vein, right popliteal vein, right posterior tibial vein, and right peroneal vein. Thrombosis extends proximally into external iliac vein.  Proximal iliac  vein not visualized. Left: Findings consistent with  acute deep vein thrombosis involving the left common femoral vein, left femoral vein, left popliteal vein, left posterior tibial vein, and left peroneal vein. Findings consistent with acute superficial vein thrombosis  involving the left great saphenous vein. Thrombosis extends proximally into external iliac vein. Proximal iliac vein not visualized.  07/01/18 Clot evacuation   RUE doppler 07/02/18 Antimicrobials: completed ancef.   Subjective: No new complaints. Had questions about the rehab center at [facility].   Objective: Vitals:   08/02/18 0721 08/02/18 0800 08/02/18 1113 08/02/18 1641  BP: 110/76  102/65 99/70  Pulse: 96 97 (!) 101 94  Resp: 14 (!) 21 (!) 26 (!) 21  Temp: 98.8 F (37.1 C)  99 F (37.2 C) 97.6 F (36.4 C)  TempSrc: Oral  Oral Oral  SpO2: 98% 99% 98% 98%  Weight:      Height:        Intake/Output Summary (Last 24 hours) at 08/02/2018 1754 Last data filed at 08/02/2018 1730 Gross per 24 hour  Intake 1176.54 ml  Output 2000 ml  Net -823.46 ml   Filed Weights   07/07/18 1000 07/18/18 0600 07/27/18 1502  Weight: 123.8 kg 135 kg 127.6 kg    Examination:  General exam: Appears calm and comfortable  Respiratory system: Clear to auscultation. Respiratory effort normal. Cardiovascular system: S1 & S2 heard, RRR. No JVD, murmurs,No pedal edema. Gastrointestinal system: Abdomen is nondistended, soft and nontender.  Normal bowel sounds heard. Central nervous system: Alert and oriented. No focal neurological deficits. Extremities: pedal edema present.  Skin: right tibial ischial tuberosity stage 2 pressure ulcer.  Psychiatry:  Mood & affect appropriate.     Data Reviewed: I have personally reviewed following labs and imaging studies  CBC: Recent Labs  Lab 07/29/18 0242 07/30/18 0719 07/31/18 0440 08/01/18 0440 08/02/18 0429  WBC 4.8 4.4 5.2 5.5 6.1  HGB 9.1* 9.2* 9.6* 9.4* 9.1*  HCT 29.5* 29.9* 31.5* 30.4* 30.2*  MCV 97.4 98.7 97.5 96.8 99.0    PLT 61* 63* 85* 112* 789*   Basic Metabolic Panel: Recent Labs  Lab 07/27/18 0430 07/29/18 0242 07/31/18 0440 08/02/18 0429  NA 142 139 142 140  K 3.6 3.7 3.9 4.0  CL 103 103 105 104  CO2 _0 GLUCOSE 280* 375* 227* 214*  BUN 51* 52* 37* 34*  CREATININE 0.65 0.95 0.64 0.72  CALCIUM 9.4 9.0 9.3 9.0  MG 2.0  --   --   --    GFR: Estimated Creatinine Clearance: 142.8 mL/min (by C-G formula based on SCr of 0.72 mg/dL). Liver Function Tests: Recent Labs  Lab 07/27/18 0430  AST 28  ALT 22  ALKPHOS 53  BILITOT 0.7  PROT 5.7*  ALBUMIN 3.2*   No results for input(s): LIPASE, AMYLASE in the last 168 hours. No results for input(s): AMMONIA in the last 168 hours. Coagulation Profile: Recent Labs  Lab 07/27/18 0430  INR 1.25   Cardiac Enzymes: No results for input(s): CKTOTAL, CKMB, CKMBINDEX, TROPONINI in the last 168 hours. BNP (last 3 results) No results for input(s): PROBNP in the last 8760 hours. HbA1C: No results for input(s): HGBA1C in the last 72 hours. CBG: Recent Labs  Lab 08/01/18 1633 08/01/18 2137 08/02/18 0724 08/02/18 1115 08/02/18 1733  GLUCAP 328* 357* 195* 307* 399*   Lipid Profile: No results for input(s): CHOL, HDL, LDLCALC, TRIG, CHOLHDL, LDLDIRECT in the last 72 hours. Thyroid Function Tests: No results for  input(s): TSH, T4TOTAL, FREET4, T3FREE, THYROIDAB in the last 72 hours. Anemia Panel: No results for input(s): VITAMINB12, FOLATE, FERRITIN, TIBC, IRON, RETICCTPCT in the last 72 hours. Sepsis Labs: Recent Labs  Lab 07/27/18 0430 07/28/18 0139 07/29/18 0242  PROCALCITON 0.44 0.38 0.70    No results found for this or any previous visit (from the past 240 hour(s)).       Radiology Studies: No results found.      Scheduled Meds: . Chlorhexidine Gluconate Cloth  6 each Topical Q0600  . clonazePAM  0.5 mg Oral BID  . dexamethasone  4 mg Intravenous Q24H  . feeding supplement (ENSURE ENLIVE)  237 mL Oral QID  .  furosemide  40 mg Intravenous Daily  . gabapentin  300 mg Oral TID  . Gerhardt's butt cream   Topical BID  . Influenza vac split quadrivalent PF  0.5 mL Intramuscular Tomorrow-1000  . insulin aspart  0-15 Units Subcutaneous TID WC  . insulin aspart  0-5 Units Subcutaneous QHS  . insulin aspart  3 Units Subcutaneous TID WC  . insulin glargine  35 Units Subcutaneous QHS  . magic mouthwash w/lidocaine  5 mL Oral TID  . methocarbamol  750 mg Oral QID  . midodrine  5 mg Oral BID WC  . multivitamin with minerals  1 tablet Oral Daily  . nystatin  5 mL Oral TID AC & HS  . romiPLOStim  250 mcg Subcutaneous Q Thu  . sodium chloride flush  10-40 mL Intracatheter Q12H   Continuous Infusions: . sodium chloride 10 mL/hr at 07/25/18 2300  . bivalirudin (ANGIOMAX) infusion 0.5 mg/mL (Non-ACS indications) 0.22 mg/kg/hr (08/02/18 1311)     LOS: 56 days    Time spent: 45 minutes.     Hosie Poisson, MD [facility] Pager [phone]  If 7PM-7AM, please contact night-coverage [email or web address] Password [facility] 08/02/2018, 5:54 PM

## 2018-08-02 NOTE — Progress Notes (Addendum)
ANTICOAGULATION CONSULT NOTE -  Consult follow-up Note   Pharmacy Consult for Bivaliruidin Indication: pulmonary embolus and bilateral DVT  Allergies  Allergen Reactions  . Nsaids Other (See Comments)    G6PD DEFICIENCY  . Sulfa Antibiotics Other (See Comments)    G6PD DEFICIENCY  . Protonix [Pantoprazole Sodium] Diarrhea    Severe diarrhea, Upset stomach    Patient Measurements: Height: 5\' 9"  (175.3 cm) Weight: 281 lb 4.9 oz (127.6 kg) IBW/kg (Calculated) : 70.7 Bivalirudin Dosing Weight: 127.6 kg  Vital Signs: Temp: 98.8 F (37.1 C) (11/20 0721) Temp Source: Oral (11/20 0721) BP: 110/76 (11/20 0721) Pulse Rate: 96 (11/20 0721)  Labs: Recent Labs    07/31/18 0440  08/01/18 0440 08/01/18 1233 08/01/18 1620 08/02/18 0429 08/02/18 0547  HGB 9.6*  --  9.4*  --   --  9.1*  --   HCT 31.5*  --  30.4*  --   --  30.2*  --   PLT 85*  --  112*  --   --  133*  --   APTT 47*   < > 49* 54* 55*  --  49*  CREATININE 0.64  --   --   --   --  0.72  --    < > = values in this interval not displayed.    Estimated Creatinine Clearance: 142.8 mL/min (by C-G formula based on SCr of 0.72 mg/dL).  Assessment:  52 yr old male with new PE per 11/13 CTA and bilateral DVT per duplex today, and thrombocytpenia. Has IVC filter.  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  Last MRI (11/10) did not show evidence of compressive hematoma.  Hx PE and DVT and was on Eliquis prior to admit. Dr. Myna Hidalgo and Dr. Wynetta Emery discussed risk/benefit and to begin Bivalirudin with low therapeutic goal.  Previously on Argatroban 10/22>10/23 then Bivalirudin 10/23>10/25. Noted switched due to Argatroban possibly contributing to bradycardia and hypotension. Pharmacy has been consulted to restart Bivalirudin.   APTT slightly subtherapeutic again this AM at 49 secs. No active bleeding or issues with infusion per discussion with RN. Hg low but stable, plt improved to 133, LDH  up to 500.  Goal of Therapy:  aPTT 50-67 seconds (lower end of usual range 50-85 seconds) seconds Monitor platelets by anticoagulation protocol: Yes   Plan:  Increase Bivalirudin to 0.22 mg/kg/hr 2h APTT Daily aPTT/CBC Monitor for any signs or symptoms of bleeding F/u Oncology plans - transition to DOAC at end of the week per note  Babs Bertin, PharmD, BCPS Clinical Pharmacist Clinical phone [phone] Please check AMION for all [facility] contact numbers 08/02/2018 7:51 AM   ADDENDUM: aPTT remains subtherapeutic after rate increase. Rate not charted as adjusted until 1010 and level drawn at <1hr later. However, aPTT has dropped to 43. Per discussion with RN - she has had issues with the IV line occluding and had to turn off drip and flush multiple times this AM. RN estimates drip was off ~20-30 minutes during this time period. Will hold off on adjusting drip for now and recheck aPTT this afternoon. No issues with bleeding reported and drip appears to be running now with no issues per RN.  Plan: Continue Bivalirudin at 0.22 mg/kg/hr for now Recheck 2h APTT this afternoon Daily aPTT/CBC Monitor for any signs or symptoms of bleeding F/u Oncology plans - transition to DOAC at end of the week per note  Babs Bertin, PharmD, BCPS Please check  AMION for all [facility] contact numbers Clinical Pharmacist 08/02/2018 11:46 AM

## 2018-08-02 NOTE — Progress Notes (Signed)
Inpatient Diabetes Program Recommendations  AACE/ADA: New Consensus Statement on Inpatient Glycemic Control (2019)  Target Ranges:  Prepandial:   less than 140 mg/dL      Peak postprandial:   less than 180 mg/dL (1-2 hours)      Critically ill patients:  140 - 180 mg/dL  Results for [name], Ronte (MRN [id number]) as of 08/02/2018 11:29  Ref. Range 08/01/2018 07:26 08/01/2018 11:32 08/01/2018 16:33 08/01/2018 21:37 08/02/2018 07:24  Glucose-Capillary Latest Ref Range: 70 - 99 mg/dL 119158 (H) 147260 (H) 829328 (H) 357 (H) 195 (H)   Results for [name], Bransyn (MRN [id number]) as of 08/02/2018 11:29  Ref. Range 07/31/2018 07:38 07/31/2018 11:56 07/31/2018 16:18 07/31/2018 21:12  Glucose-Capillary Latest Ref Range: 70 - 99 mg/dL 784209 (H) 696239 (H) 295308 (H) 331 (H)   Review of Glycemic Control Diabetes history: No Outpatient Diabetes medications: NA Current orders for Inpatient glycemic control: Lantus 35 units QHS, Novolog 0-15 units TID with meals, Novolog 0-5 units QHS; Decadron 4 mg Q24H  Inpatient Diabetes Program Recommendations:   Insulin - Meal Coverage: Per chart review, appears patient is not eating meals but is receiving supplements QID.  Please consider ordering Novolog 5 units QID scheduled along with timing of Ensure Enlive supplements.  Thanks, Orlando PennerMarie [place], RN, MSN, CDE Diabetes Coordinator Inpatient Diabetes Program [phone] (Team Pager from 8am to 5pm)

## 2018-08-02 NOTE — Progress Notes (Signed)
ANTICOAGULATION CONSULT NOTE -  Consult follow-up Note   Pharmacy Consult for Bivaliruidin Indication: pulmonary embolus and bilateral DVT  Allergies  Allergen Reactions  . Nsaids Other (See Comments)    G6PD DEFICIENCY  . Sulfa Antibiotics Other (See Comments)    G6PD DEFICIENCY  . Protonix [Pantoprazole Sodium] Diarrhea    Severe diarrhea, Upset stomach    Patient Measurements: Height: 5\' 9"  (175.3 cm) Weight: 281 lb 4.9 oz (127.6 kg) IBW/kg (Calculated) : 70.7 Bivalirudin Dosing Weight: 127.6 kg  Vital Signs: Temp: 97.6 F (36.4 C) (11/20 1641) Temp Source: Oral (11/20 1641) BP: 99/70 (11/20 1641) Pulse Rate: 94 (11/20 1641)  Labs: Recent Labs    07/31/18 0440  08/01/18 0440  08/02/18 0429 08/02/18 0547 08/02/18 1101 08/02/18 1650  HGB 9.6*  --  9.4*  --  9.1*  --   --   --   HCT 31.5*  --  30.4*  --  30.2*  --   --   --   PLT 85*  --  112*  --  133*  --   --   --   APTT 47*   < > 49*   < >  --  49* 43* 58*  CREATININE 0.64  --   --   --  0.72  --   --   --    < > = values in this interval not displayed.    Estimated Creatinine Clearance: 142.8 mL/min (by C-G formula based on SCr of 0.72 mg/dL).  Assessment:  52 yr old male with new PE per 11/13 CTA and bilateral DVT per duplex today, and thrombocytpenia. Has IVC filter.  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  Last MRI (11/10) did not show evidence of compressive hematoma.  Hx PE and DVT and was on Eliquis prior to admit. Dr. Myna HidalgoEnnever and Dr. Wynetta Emeryram discussed risk/benefit and to begin Bivalirudin with low therapeutic goal.  Previously on Argatroban 10/22>10/23 then Bivalirudin 10/23>10/25. Noted switched due to Argatroban possibly contributing to bradycardia and hypotension. Pharmacy has been consulted to restart Bivalirudin.   aptt within goal now  Goal of Therapy:  aPTT 50-67 seconds (lower end of usual range 50-85 seconds) seconds Monitor platelets by  anticoagulation protocol: Yes   Plan: Continue Bivalirudin at 0.22 mg/kg/hr for now Recheck 4h APTT Daily aPTT/CBC Monitor for any signs or symptoms of bleeding F/u Oncology plans - transition to DOAC at end of the week per note  Isaac BlissMichael Liston Thum, PharmD, BCPS, BCCCP Clinical Pharmacist [phone]  Please check AMION for all [facility] numbers  08/02/2018 6:41 PM

## 2018-08-02 NOTE — Progress Notes (Signed)
ANTICOAGULATION CONSULT NOTE   Pharmacy Consult for Bivalirudin Indication: pulmonary embolus and bilateral DVT  Allergies  Allergen Reactions  . Nsaids Other (See Comments)    G6PD DEFICIENCY  . Sulfa Antibiotics Other (See Comments)    G6PD DEFICIENCY  . Protonix [Pantoprazole Sodium] Diarrhea    Severe diarrhea, Upset stomach    Patient Measurements: Height: 5\' 9"  (175.3 cm) Weight: 281 lb 4.9 oz (127.6 kg) IBW/kg (Calculated) : 70.7 Bivalirudin Dosing Weight: 127.6 kg  Vital Signs: Temp: 98.2 F (36.8 C) (11/20 1900) Temp Source: Oral (11/20 1900) BP: 107/73 (11/20 1900) Pulse Rate: 94 (11/20 1641)  Labs: Recent Labs    07/31/18 0440  08/01/18 0440  08/02/18 0429  08/02/18 1101 08/02/18 1650 08/02/18 2204  HGB 9.6*  --  9.4*  --  9.1*  --   --   --   --   HCT 31.5*  --  30.4*  --  30.2*  --   --   --   --   PLT 85*  --  112*  --  133*  --   --   --   --   APTT 47*   < > 49*   < >  --    < > 43* 58* 54*  CREATININE 0.64  --   --   --  0.72  --   --   --   --    < > = values in this interval not displayed.    Estimated Creatinine Clearance: 142.8 mL/min (by C-G formula based on SCr of 0.72 mg/dL).  Assessment:  52 yr old male with new PE per 11/13 CTA and bilateral DVT per duplex today, and thrombocytpenia. Has IVC filter.  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  Last MRI (11/10) did not show evidence of compressive hematoma.  Hx PE and DVT and was on Eliquis prior to admit. Dr. Myna HidalgoEnnever and Dr. Wynetta Emeryram discussed risk/benefit and to begin Bivalirudin with low therapeutic goal.  Previously on Argatroban 10/22>10/23 then Bivalirudin 10/23>10/25. Noted switched due to Argatroban possibly contributing to bradycardia and hypotension. Pharmacy has been consulted to restart Bivalirudin.   11/20 PM update: aPTT now therapeutic x 2 after rate increase  Goal of Therapy:  aPTT 50-67 seconds (lower end of usual range 50-85  seconds) seconds Monitor platelets by anticoagulation protocol: Yes   Plan:  Cont Bivalirudin at 0.22 mg/kg/hr Daily aPTT/CBC Monitor for any signs or symptoms of bleeding F/u Oncology plans - transition to DOAC at end of the week per note  [name] [name], PharmD, BCPS Clinical Pharmacist Phone: [phone]

## 2018-08-03 ENCOUNTER — Inpatient Hospital Stay (HOSPITAL_COMMUNITY)

## 2018-08-03 LAB — COMPREHENSIVE METABOLIC PANEL
ALK PHOS: 60 U/L (ref 38–126)
ALT: 27 U/L (ref 0–44)
AST: 34 U/L (ref 15–41)
Albumin: 2.8 g/dL — ABNORMAL LOW (ref 3.5–5.0)
Anion gap: 8 (ref 5–15)
BUN: 31 mg/dL — AB (ref 6–20)
CALCIUM: 8.9 mg/dL (ref 8.9–10.3)
CO2: 28 mmol/L (ref 22–32)
CREATININE: 0.66 mg/dL (ref 0.61–1.24)
Chloride: 104 mmol/L (ref 98–111)
GFR calc Af Amer: >60 mL/min (ref >60)
Glucose, Bld: 127 mg/dL — ABNORMAL HIGH (ref 70–99)
Potassium: 3.8 mmol/L (ref 3.5–5.1)
Sodium: 140 mmol/L (ref 135–145)
TOTAL PROTEIN: 5.5 g/dL — AB (ref 6.5–8.1)
Total Bilirubin: 0.8 mg/dL (ref 0.3–1.2)

## 2018-08-03 LAB — GLUCOSE, CAPILLARY
GLUCOSE-CAPILLARY: 113 mg/dL — AB (ref 70–99)
Glucose-Capillary: 153 mg/dL — ABNORMAL HIGH (ref 70–99)
Glucose-Capillary: 116 mg/dL — ABNORMAL HIGH (ref 70–99)
Glucose-Capillary: 91 mg/dL (ref 70–99)

## 2018-08-03 LAB — CBC
HEMATOCRIT: 28.3 % — AB (ref 39.0–52.0)
HEMOGLOBIN: 9 g/dL — AB (ref 13.0–17.0)
MCH: 31 pg (ref 26.0–34.0)
MCHC: 31.8 g/dL (ref 30.0–36.0)
MCV: 97.6 fL (ref 80.0–100.0)
Platelets: 160 10*3/uL (ref 150–400)
RBC: 2.9 MIL/uL — AB (ref 4.22–5.81)
RDW: 17.5 % — ABNORMAL HIGH (ref 11.5–15.5)
WBC: 6.3 10*3/uL (ref 4.0–10.5)
nRBC: 4.1 % — ABNORMAL HIGH (ref 0.0–0.2)

## 2018-08-03 LAB — APTT: APTT: 54 s — AB (ref 24–36)

## 2018-08-03 LAB — PATHOLOGIST SMEAR REVIEW

## 2018-08-03 LAB — LACTATE DEHYDROGENASE: LDH: 483 U/L — ABNORMAL HIGH (ref 98–192)

## 2018-08-03 MED ORDER — SACCHAROMYCES BOULARDII 250 MG PO CAPS
250.0000 mg | ORAL_CAPSULE | Freq: Two times a day (BID) | ORAL | Status: DC
  Administered 2018-08-03 – 2018-08-13 (×21): 250 mg via ORAL
  Filled 2018-08-03 (×21): qty 1

## 2018-08-03 MED ORDER — PANCRELIPASE (LIP-PROT-AMYL) 12000-38000 UNITS PO CPEP
36000.0000 [IU] | ORAL_CAPSULE | Freq: Three times a day (TID) | ORAL | Status: DC
  Administered 2018-08-03 – 2018-08-13 (×28): 36000 [IU] via ORAL
  Filled 2018-08-03 (×29): qty 3

## 2018-08-03 MED ORDER — ENSURE ENLIVE PO LIQD
237.0000 mL | Freq: Two times a day (BID) | ORAL | Status: DC
  Administered 2018-08-04 – 2018-08-05 (×4): 237 mL via ORAL

## 2018-08-03 MED ORDER — IOHEXOL 300 MG/ML  SOLN
100.0000 mL | Freq: Once | INTRAMUSCULAR | Status: AC | PRN
  Administered 2018-08-03: 100 mL via INTRAVENOUS

## 2018-08-03 MED ORDER — FUROSEMIDE 10 MG/ML IJ SOLN
40.0000 mg | Freq: Two times a day (BID) | INTRAMUSCULAR | Status: DC
  Administered 2018-08-03 – 2018-08-05 (×4): 40 mg via INTRAVENOUS
  Filled 2018-08-03 (×4): qty 4

## 2018-08-03 MED ORDER — DEXAMETHASONE SODIUM PHOSPHATE 10 MG/ML IJ SOLN
2.0000 mg | INTRAMUSCULAR | Status: DC
  Administered 2018-08-03: 2 mg via INTRAVENOUS
  Filled 2018-08-03: qty 1

## 2018-08-03 NOTE — Progress Notes (Addendum)
OT Cancellation Note  Patient Details Name: [name] [name] MRN: [id number] DOB: Sep 20, 1965   Cancelled Treatment:    Reason Eval/Treat Not Completed: Other (comment); spoke with pt, pt in positive spirits however reports he just received news of potential need for surgery, stating "not today" when asked about participating in therapy. Pt verbalizes is willing and ready to work with OT, hoping/enthusiastic about having more consistent OT if possible. Will follow up as schedule permits.   Marcy SirenBreanna Damiel Barthold, OT [facility] Pager [phone] Office [phone]  [name] [name] 08/03/2018, 11:50 AM

## 2018-08-03 NOTE — Progress Notes (Signed)
Nutrition Follow-up  DOCUMENTATION CODES:   Morbid obesity  INTERVENTION:   -Decrease Ensure Enlive po to BID, each supplement provides 350 kcal and 20 grams of protein -Continue MVI with minerals daily  NUTRITION DIAGNOSIS:   Increased nutrient needs related to wound healing, post-op healing as evidenced by estimated needs.  Ongoing  GOAL:   Patient will meet greater than or equal to 90% of their needs  Progressing  MONITOR:   PO intake, Supplement acceptance, Labs, Weight trends, Skin, I & O's  REASON FOR ASSESSMENT:   Low Braden    ASSESSMENT:   [name] [name] is a 52 y.o. male with history of hypertension who has had a recent ACDF at C3 C5 by Dr. Saintclair Halsted in July 5397 complicated by pulmonary embolism was placed on apixaban subsequently discharged to rehab and patient had moved to [place] area to [place] and has been living with his parents.  Had gone to ER around August 27 for complaints of increasing weakness and at the time was found to be febrile and was admitted for possible pneumonia.  He started antibiotic as per the ER physician with whom I discussed.  Patient was eventually discharged to rehab and patient was discharged from rehab last week.  Patient had come to [facility] for possible IVC filter placement for his known DVT and on reaching back home in [place] patient on trying to get out of the car started finding it difficult to walk and his legs gave away.  He also states he inadvertently turned his neck.  Following which patient has been having increasing weakness difficult to walk.  Prior to which patient was able to walk up the stairs.  Denies any incontinence of urine or bowel.  Neck pain has been chronic.  Denies any chest pain or shortness of breath.  9/27- s/p rt knee aspiration and injections 10/2- s/p posterior cervical decompressive laminectomy at C3, C4, C5 with removal of the spinous process and complete laminectomy and foraminotomies of the C3,  C4, C5 nerve roots; Posterior cervical fusion with lateral mass screws at C3-C4-C5 and pars screws at C2 lies in the globus ellipseLateral mass screw system; Posterior lateral arthrodesis C2-C5 utilizing locally harvested autograft mixed with vivigen 10/10- s/p reexploration posterior cervical wound for evacuation of epidural hematoma 10/19- s/p cervical wound revision with evacuation of cervical epidural hematoma 10/25- s/p reexploration of posterior surgical cervical wound for evacuation of epidural hematoma 10/29- s/p xxploration of posterior cervical wound and evacuation of epidural hematoma 10/30- s/p bone marrow biopsy  Pt sleeping soundly at time of visit; RD did not disturb.   Case discussed extensively with RN. She reports that pt's appetite has improved- pt will sometimes consume food off meal trays, but majority of food is being brought in by friends and family members. Pt is compliant with supplements and medications. Per RN, large concern regarding erratic blood sugars; DM coordinator has been pt and made adjustments to insulin regimen. Meal completion has improved, but erratic (PO: 0-100%).   RN shares that blood counts have improved, however, pt awaiting general surgery consult due to free air being present on CT of abdomen.   Per neurosurgery notes, tentative plan to discharge to [facility].   Labs reviewed: CBGS: 113-153(inpatient orders for glycemic control are 0-15 units insulin aspart TID with meals, 0-5 units insulin aspart q HS, 3 units insulin aspart TID with meals, and 35 units insulin glargine q HS).   Diet Order:   Diet Order  Diet NPO time specified Except for: Sips with Meds  Diet effective now              EDUCATION NEEDS:   Education needs have been addressed  Skin:  Skin Assessment: Skin Integrity Issues: Skin Integrity Issues:: Stage II, Incisions Stage II: rt ischial tuberosity Incisions: closed neck  Last BM:   08/02/18  Height:   Ht Readings from Last 1 Encounters:  06/22/18 '5\' 9"'  (1.753 m)    Weight:   Wt Readings from Last 1 Encounters:  07/27/18 127.6 kg    Ideal Body Weight:  72.7 kg  BMI:  Body mass index is 41.54 kg/m.  Estimated Nutritional Needs:   Kcal:  2100-2300  Protein:  145-160 grams  Fluid:  > 2.1 L    Zelma Mazariego A. Jimmye Norman, RD, LDN, CDE Pager: [phone] After hours Pager: [phone]

## 2018-08-03 NOTE — Consult Note (Signed)
[facility] Surgery Consult Note  [name] September 06, 1966  [id number].    Requesting MD: Hosie Poisson Chief Complaint/Reason for Consult: free air  HPI:  [name] [name] is a 52yo male who is admitted to [facility] following anterior cervical corpectomy of C4 on 0/25/4270 with a complicated postoperative course including development of multiple PEs and DVTs started on anticoagulation, IVF filter placed, and subsequently developed hematomas requiring multiple repeat surgeries for evacuation of hematomas. Currently patient is on bivalirudin and tolerating well.  This morning a chest xray was obtained and there was concern for possible free intraperitoneal air, therefore a dedicated abdominal film was taken which shows a large amount of free intraperitoneal air in the upper abdomen bilaterally. General surgery asked to see. Patient denies any abdominal pain. He is currently tolerating a diet and having bowel function. Stools are loose, nonbloody. Denies n/v. WBC 6.3, afebrile, BP 100/73, HR 97. He has had no prior abdominal surgeries. Last colonoscopy ~2017, per patient it was normal.  ROS: Review of Systems  Constitutional: Negative.  Negative for fever.  HENT: Negative.   Eyes: Negative.   Respiratory: Negative.   Cardiovascular: Positive for leg swelling.  Gastrointestinal: Positive for diarrhea. Negative for abdominal pain, nausea and vomiting.  Genitourinary: Negative.   Musculoskeletal: Positive for joint pain.  Skin: Negative.   Neurological: Positive for focal weakness.   All systems reviewed and otherwise negative except for as above  Family History  Family history unknown: Yes    Past Medical History:  Diagnosis Date  . Degenerative disc disease, lumbar   . G6PD deficiency   . Gout   . History of pernicious anemia 1980   Patient says he takes OTC iron supplements QD  . Hypertension     Past Surgical History:  Procedure Laterality Date  . ANTERIOR CERVICAL  CORPECTOMY N/A 03/24/2018   Procedure: CERVICAL FOUR ANTERIOR CERVICAL CORPECTOMY;  Surgeon: Kary Kos, MD;  Location: [place];  Service: Neurosurgery;  Laterality: N/A;  . ANTERIOR CERVICAL DECOMP/DISCECTOMY FUSION N/A 03/28/2018   Procedure: Reexploration of anterior cervical wound;  Surgeon: Kary Kos, MD;  Location: [place];  Service: Neurosurgery;  Laterality: N/A;  . HEMATOMA EVACUATION N/A 07/10/2018   Procedure: POSTERIOR CERVICAL HEMATOMA EVACUATION;  Surgeon: Judith Part, MD;  Location: [place];  Service: Neurosurgery;  Laterality: N/A;  . IR IVC FILTER PLMT / S&I Burke Keels GUID/MOD SED  03/28/2018  . IR RADIOLOGIST EVAL & MGMT  05/31/2018  . MENISCUS REPAIR Left   . POSTERIOR CERVICAL FUSION/FORAMINOTOMY N/A 06/14/2018   Procedure: POSTERIOR CERVICAL DECOMPRESSION/FUSION CERVICAL TWO- CERVICAL THREE CERVICAL THREE- CERVICAL FOUR, CERVICAL FOUR- CERVICAL FIVE;  Surgeon: Kary Kos, MD;  Location: [place];  Service: Neurosurgery;  Laterality: N/A;  POSTERIOR CERVICAL DECOMPRESSION/FUSION CERVICAL TWO- CERVICAL THREE CERVICAL THREE- CERVICAL FOUR, CERVICAL FOUR- CERVICAL FIVE  . POSTERIOR CERVICAL LAMINECTOMY N/A 06/22/2018   Procedure: POSTERIOR CERVICAL LAMINECTOMY FOR EVACUATION OF HEMATOMA;  Surgeon: Kary Kos, MD;  Location: [place];  Service: Neurosurgery;  Laterality: N/A;  . POSTERIOR CERVICAL LAMINECTOMY N/A 07/07/2018   Procedure: Evacuation of Hematoma POSTERIOR CERVICAL;  Surgeon: Kary Kos, MD;  Location: [place];  Service: Neurosurgery;  Laterality: N/A;  Evacuation of Hematoma POSTERIOR CERVICAL  . POSTERIOR CERVICAL LAMINECTOMY FOR EPIDURAL ABSCESS N/A 07/01/2018   Procedure: EVACUATION OF POSTERIOR CERVICAL HEMATOMA;  Surgeon: Judith Part, MD;  Location: [place];  Service: Neurosurgery;  Laterality: N/A;  . TENDON REPAIR     tricept tendon    Social History:  reports that he has never smoked. He has never used smokeless tobacco. He reports that he does not drink alcohol or  use drugs.  Allergies:  Allergies  Allergen Reactions  . Nsaids Other (See Comments)    G6PD DEFICIENCY  . Sulfa Antibiotics Other (See Comments)    G6PD DEFICIENCY  . Protonix [Pantoprazole Sodium] Diarrhea    Severe diarrhea, Upset stomach    Medications Prior to Admission  Medication Sig Dispense Refill  . acetaminophen (TYLENOL) 325 MG tablet Take 2 tablets (650 mg total) by mouth every 4 (four) hours as needed for mild pain ((score 1 to 3) or temp > 100.5).    Marland Kitchen apixaban (ELIQUIS) 5 MG TABS tablet Take 1 tablet (5 mg total) by mouth 2 (two) times daily. 60 tablet 1  . aspirin EC 81 MG EC tablet Take 1 tablet (81 mg total) by mouth daily.    . baclofen (LIORESAL) 10 MG tablet Take 1 tablet (10 mg total) by mouth 2 (two) times daily as needed for muscle spasms. 30 each 0  . colchicine 0.6 MG tablet Take 1 tablet (0.6 mg total) by mouth daily. 30 tablet 0  . Cyanocobalamin 5000 MCG/ML LIQD Place 5,000 mcg under the tongue daily. (Patient taking differently: Place 5,000 mcg under the tongue as needed. ) 1 Bottle 0  . hydrochlorothiazide (MICROZIDE) 12.5 MG capsule Take 1 capsule (12.5 mg total) by mouth daily. 30 capsule 1  . oxyCODONE 10 MG TABS Take 1 tablet (10 mg total) by mouth every 3 (three) hours as needed for severe pain ((score 7 to 10)). 30 tablet 0  . saccharomyces boulardii (FLORASTOR) 250 MG capsule Take 1 capsule (250 mg total) by mouth 2 (two) times daily. (Patient not taking: Reported on 06/08/2018) 60 capsule 0    Prior to Admission medications   Medication Sig Start Date End Date Taking? Authorizing Provider  acetaminophen (TYLENOL) 325 MG tablet Take 2 tablets (650 mg total) by mouth every 4 (four) hours as needed for mild pain ((score 1 to 3) or temp > 100.5). 05/02/18  Yes Angiulli, Lavon Paganini, PA-C  apixaban (ELIQUIS) 5 MG TABS tablet Take 1 tablet (5 mg total) by mouth 2 (two) times daily. 05/02/18  Yes Angiulli, Lavon Paganini, PA-C  aspirin EC 81 MG EC tablet Take 1  tablet (81 mg total) by mouth daily. 05/02/18  Yes Angiulli, Lavon Paganini, PA-C  baclofen (LIORESAL) 10 MG tablet Take 1 tablet (10 mg total) by mouth 2 (two) times daily as needed for muscle spasms. 05/02/18  Yes Angiulli, Lavon Paganini, PA-C  colchicine 0.6 MG tablet Take 1 tablet (0.6 mg total) by mouth daily. 05/02/18  Yes Angiulli, Lavon Paganini, PA-C  Cyanocobalamin 5000 MCG/ML LIQD Place 5,000 mcg under the tongue daily. Patient taking differently: Place 5,000 mcg under the tongue as needed.  05/02/18  Yes Angiulli, Lavon Paganini, PA-C  hydrochlorothiazide (MICROZIDE) 12.5 MG capsule Take 1 capsule (12.5 mg total) by mouth daily. 05/02/18  Yes Angiulli, Lavon Paganini, PA-C  oxyCODONE 10 MG TABS Take 1 tablet (10 mg total) by mouth every 3 (three) hours as needed for severe pain ((score 7 to 10)). 05/02/18  Yes Angiulli, Lavon Paganini, PA-C  saccharomyces boulardii (FLORASTOR) 250 MG capsule Take 1 capsule (250 mg total) by mouth 2 (two) times daily. Patient not taking: Reported on 06/08/2018 05/02/18   Angiulli, Lavon Paganini, PA-C    Blood pressure 93/77, pulse (!) 102, temperature 98.4 F (36.9 C), temperature source Oral, resp. rate  20, height _0  (1.753 m), weight 127.6 kg, SpO2 98 %. Physical Exam: General: pleasant, WD/WN AA male who is laying in bed in NAD HEENT: head is normocephalic, atraumatic.  Sclera are noninjected.  Pupils equal and round.  Ears and nose without any masses or lesions.  Mouth is pink and moist. Dentition fair. Soft c-collar in place Heart: regular, rate, and rhythm.  No obvious murmurs, gallops, or rubs noted.  Palpable pedal pulses bilaterally Lungs: CTAB, no wheezes, rhonchi, or rales noted.  Respiratory effort nonlabored Abd: soft, ND, +BS, no masses, hernias, or organomegaly. Abdomen completely nontender, no peritonitis MS: 1-2+ edema BLE Skin: warm and dry with no masses, lesions, or rashes Psych: A&Ox3 with an appropriate affect. Neuro: cranial nerves grossly intact, extremity CSM intact  bilaterally, normal speech  Results for orders placed or performed during the hospital encounter of 06/06/18 (from the past 48 hour(s))  APTT     Status: Abnormal   Collection Time: 08/01/18  4:20 PM  Result Value Ref Range   aPTT 55 (H) 24 - 36 seconds    Comment:        IF BASELINE aPTT IS ELEVATED, SUGGEST PATIENT RISK ASSESSMENT BE USED TO DETERMINE APPROPRIATE ANTICOAGULANT THERAPY. Performed at [facility], [place] [address], [place], [place] [address]   Glucose, capillary     Status: Abnormal   Collection Time: 08/01/18  4:33 PM  Result Value Ref Range   Glucose-Capillary 328 (H) 70 - 99 mg/dL  Glucose, capillary     Status: Abnormal   Collection Time: 08/01/18  9:37 PM  Result Value Ref Range   Glucose-Capillary 357 (H) 70 - 99 mg/dL  Lactate dehydrogenase     Status: Abnormal   Collection Time: 08/02/18  4:29 AM  Result Value Ref Range   LDH 500 (H) 98 - 192 U/L    Comment: Performed at [facility], [place] [address], [place], [place] [address]  CBC     Status: Abnormal   Collection Time: 08/02/18  4:29 AM  Result Value Ref Range   WBC 6.1 4.0 - 10.5 K/uL   RBC 3.05 (L) 4.22 - 5.81 MIL/uL   Hemoglobin 9.1 (L) 13.0 - 17.0 g/dL   HCT 30.2 (L) 39.0 - 52.0 %   MCV 99.0 80.0 - 100.0 fL   MCH 29.8 26.0 - 34.0 pg   MCHC 30.1 30.0 - 36.0 g/dL   RDW 17.2 (H) 11.5 - 15.5 %   Platelets 133 (L) 150 - 400 K/uL   nRBC 4.0 (H) 0.0 - 0.2 %    Comment: Performed at [facility] [address], [place], [place] [address]  Basic metabolic panel     Status: Abnormal   Collection Time: 08/02/18  4:29 AM  Result Value Ref Range   Sodium 140 135 - 145 mmol/L   Potassium 4.0 3.5 - 5.1 mmol/L   Chloride 104 98 - 111 mmol/L   CO2 25 22 - 32 mmol/L   Glucose, Bld 214 (H) 70 - 99 mg/dL   BUN 34 (H) 6 - 20 mg/dL   Creatinine, Ser 0.72 0.61 - 1.24 mg/dL   Calcium 9.0 8.9 - 10.3 mg/dL   GFR calc non Af Amer >60 >60 mL/min   GFR calc Af Amer >60 >60 mL/min    Comment:  (NOTE) The eGFR has been calculated using the CKD EPI equation. This calculation has not been validated in all clinical situations. eGFR's persistently <60 mL/min  signify possible Chronic Kidney Disease.    Anion gap 11 5 - 15    Comment: Performed at [facility] [address], [place], [place] [address]  Pathologist smear review     Status: None   Collection Time: 08/02/18  4:29 AM  Result Value Ref Range   Path Review Normocytic anemia.     Comment: Left shift including nucleated RBC Reviewed by Lennox Solders. Lyndon Code, M.D. 08/03/2018 Performed at [facility], [place] [address], [place], [place] [address]   APTT     Status: Abnormal   Collection Time: 08/02/18  5:47 AM  Result Value Ref Range   aPTT 49 (H) 24 - 36 seconds    Comment:        IF BASELINE aPTT IS ELEVATED, SUGGEST PATIENT RISK ASSESSMENT BE USED TO DETERMINE APPROPRIATE ANTICOAGULANT THERAPY. Performed at [facility], [place] [address] [place], [place] [address]   Glucose, capillary     Status: Abnormal   Collection Time: 08/02/18  7:24 AM  Result Value Ref Range   Glucose-Capillary 195 (H) 70 - 99 mg/dL  APTT     Status: Abnormal   Collection Time: 08/02/18 11:01 AM  Result Value Ref Range   aPTT 43 (H) 24 - 36 seconds    Comment:        IF BASELINE aPTT IS ELEVATED, SUGGEST PATIENT RISK ASSESSMENT BE USED TO DETERMINE APPROPRIATE ANTICOAGULANT THERAPY. Performed at [facility], [place] [address], [place], [place] [address]   Glucose, capillary     Status: Abnormal   Collection Time: 08/02/18 11:15 AM  Result Value Ref Range   Glucose-Capillary 307 (H) 70 - 99 mg/dL  APTT     Status: Abnormal   Collection Time: 08/02/18  4:50 PM  Result Value Ref Range   aPTT 58 (H) 24 - 36 seconds    Comment:        IF BASELINE aPTT IS ELEVATED, SUGGEST PATIENT RISK ASSESSMENT BE USED TO DETERMINE APPROPRIATE ANTICOAGULANT THERAPY. Performed at [facility], [place] [address]  [place], [place] [address]   Glucose, capillary     Status: Abnormal   Collection Time: 08/02/18  5:33 PM  Result Value Ref Range   Glucose-Capillary 399 (H) 70 - 99 mg/dL  APTT     Status: Abnormal   Collection Time: 08/02/18 10:04 PM  Result Value Ref Range   aPTT 54 (H) 24 - 36 seconds    Comment:        IF BASELINE aPTT IS ELEVATED, SUGGEST PATIENT RISK ASSESSMENT BE USED TO DETERMINE APPROPRIATE ANTICOAGULANT THERAPY. Performed at [facility], [place] [address], [place], [place] [address]   Glucose, capillary     Status: Abnormal   Collection Time: 08/02/18 10:08 PM  Result Value Ref Range   Glucose-Capillary 265 (H) 70 - 99 mg/dL  Lactate dehydrogenase     Status: Abnormal   Collection Time: 08/03/18  4:50 AM  Result Value Ref Range   LDH 483 (H) 98 - 192 U/L    Comment: Performed at [facility], [place] [address], [place], [place] [address]  APTT     Status: Abnormal   Collection Time: 08/03/18  4:50 AM  Result Value Ref Range   aPTT 54 (H) 24 - 36 seconds    Comment:        IF BASELINE aPTT IS ELEVATED, SUGGEST PATIENT RISK ASSESSMENT BE USED TO DETERMINE APPROPRIATE ANTICOAGULANT  THERAPY. Performed at [facility], [place] [address] [place], [place] [address]   CBC     Status: Abnormal   Collection Time: 08/03/18  4:50 AM  Result Value Ref Range   WBC 6.3 4.0 - 10.5 K/uL   RBC 2.90 (L) 4.22 - 5.81 MIL/uL   Hemoglobin 9.0 (L) 13.0 - 17.0 g/dL   HCT 28.3 (L) 39.0 - 52.0 %   MCV 97.6 80.0 - 100.0 fL   MCH 31.0 26.0 - 34.0 pg   MCHC 31.8 30.0 - 36.0 g/dL   RDW 17.5 (H) 11.5 - 15.5 %   Platelets 160 150 - 400 K/uL   nRBC 4.1 (H) 0.0 - 0.2 %    Comment: Performed at [facility], [place] [address], [place], [place] [address]  Glucose, capillary     Status: Abnormal   Collection Time: 08/03/18  7:57 AM  Result Value Ref Range   Glucose-Capillary 113 (H) 70 - 99 mg/dL  Comprehensive metabolic panel     Status: Abnormal   Collection Time: 08/03/18   8:46 AM  Result Value Ref Range   Sodium 140 135 - 145 mmol/L   Potassium 3.8 3.5 - 5.1 mmol/L   Chloride 104 98 - 111 mmol/L   CO2 28 22 - 32 mmol/L   Glucose, Bld 127 (H) 70 - 99 mg/dL   BUN 31 (H) 6 - 20 mg/dL   Creatinine, Ser 0.66 0.61 - 1.24 mg/dL   Calcium 8.9 8.9 - 10.3 mg/dL   Total Protein 5.5 (L) 6.5 - 8.1 g/dL   Albumin 2.8 (L) 3.5 - 5.0 g/dL   AST 34 15 - 41 U/L   ALT 27 0 - 44 U/L   Alkaline Phosphatase 60 38 - 126 U/L   Total Bilirubin 0.8 0.3 - 1.2 mg/dL   GFR calc non Af Amer >60 >60 mL/min   GFR calc Af Amer >60 >60 mL/min    Comment: (NOTE) The eGFR has been calculated using the CKD EPI equation. This calculation has not been validated in all clinical situations. eGFR's persistently <60 mL/min signify possible Chronic Kidney Disease.    Anion gap 8 5 - 15    Comment: Performed at [facility] [address] [place], [place] [address]  Glucose, capillary     Status: Abnormal   Collection Time: 08/03/18 11:34 AM  Result Value Ref Range   Glucose-Capillary 153 (H) 70 - 99 mg/dL   Dg Chest Port 1 View  Result Date: 08/03/2018 CLINICAL DATA:  Fever. EXAM: PORTABLE CHEST 1 VIEW COMPARISON:  04/26/2018 in 07/2011 19 FINDINGS: Heart size is normal. There is subsegmental atelectasis or early infiltrate in the LEFT LOWER lobe. Mild focal atelectasis in the MEDIAL RIGHT lung base. Patient has a LEFT-sided PICC line, tip overlying the level of superior vena cava. There is elevation of LEFT hemidiaphragm. Subdiaphragmatic lucency may indicate distended stomach. Given the appearance, is difficult to entirely exclude free intraperitoneal air, and correlation is recommended with any history of abdominal symptoms. The IMPRESSION: 1. LEFT LOWER lobe atelectasis or early infiltrate. 2. RIGHT LOWER lobe atelectasis. 3. Elevated LEFT hemidiaphragm with question of free intraperitoneal air. Alternatively this may represent dilated, gas-filled stomach or loop of colon. Consider  further evaluation with a decubitus view of the abdomen if there are abdominal symptoms. These results will be called to the ordering clinician or representative by the Radiologist Assistant, and communication documented in the PACS or zVision Dashboard. Electronically Signed  By: Nolon Nations M.D.   On: 08/03/2018 08:52   Dg Abd 2 Views  Result Date: 08/03/2018 CLINICAL DATA:  Ileus EXAM: ABDOMEN - 2 VIEW COMPARISON:  07/28/2018 CT abdomen FINDINGS: There is a large amount of free intraperitoneal air throughout the upper abdomen bilaterally. No dilated small bowel loops. Large amount of stool throughout the colon. No radiopaque nephrolithiasis. Stable elevation of the left hemidiaphragm with left basilar atelectasis. IVC filter overlies the medial right abdomen. IMPRESSION: 1. Large amount of free intraperitoneal air throughout the upper abdomen bilaterally, new since 07/28/2018 CT abdomen study. Perforated viscus is the diagnosis of exclusion. 2. Nonobstructive bowel gas pattern. 3. Large colonic stool volume constipation. 4. Suggesting stable elevation of the left hemidiaphragm with left basilar atelectasis. Critical Value/emergent results were called by telephone at the time of interpretation on 08/03/2018 at 11:22 am to Dr. Hosie Poisson , who verbally acknowledged these results. Electronically Signed   By: Ilona Sorrel M.D.   On: 08/03/2018 11:26      Assessment/Plan HTN Anterior cervical corpectomy of C4 on 1/99/4129 with complicated postoperative course requiring multiple repeat operations Multiple PE/DVTs - on bivalirrubin Thrombocytopenia/coagulopathy  ?Pneumoperitoneum - Patient is completely nontender on abdominal exam and has no signs of peritonitis. WBC WNL, VSS. Make NPO for now. Obtain CT abdomen/pelvis. Will follow.   Wellington Hampshire, [facility] Surgery 08/03/2018, 12:51 PM Pager: [phone] Mon 7:00 am -11:30 AM Tues-Fri 7:00 am-4:30 pm Sat-Sun 7:00  am-11:30 am

## 2018-08-03 NOTE — Progress Notes (Signed)
MD paged regarding CXR results called into RN.

## 2018-08-03 NOTE — Progress Notes (Signed)
PROGRESS NOTE    [name] [name]  WJX:[id number] DOB: 20-Feb-1966 DOA: 06/06/2018 PCP: Helane Rima, MD    Brief Narrative: 52 year old man with past medical history relevant for gout, hypertension anterior cervical corpectomy of C4 on 03/24/2018 with comp gated postoperative course including development of multiple PEs and DVTs started on anticoagulation and subsequently developed hematoma requiring reexploration on 03/28/2018 status post placement of IVC filter who was discharged to skilled nursing facility and had a complicated course including progressive lower extremity weakness, intermittent fevers of unclear etiology and hospitalizations for "pneumonia". Patient was admitted to this hospital is a transfer from [facility] with MRI of C-spine that showed postoperative changes and cord compression with cord edema and concern for ischemia. Patient is status post posterior cervical decompression laminectomy of C3, C4, C5, removal of spinous processes, foraminotomies on 06/14/2018. On 06/22/2018 patient began to develop worsening left sided paresthesias and weakness and a stat MRI showed evidence of postoperative hematoma. Patient was taken to the OR with evacuation of hematoma. Additionally his postoperative course has been complicated by recurrent fevers,bilateral lower extremity DVTs on ultrasound,migratory arthralgias/arthritis that are thought to be rheumatologic in nature as well as significant orthostasis limiting the amount he can work with physical therapy. His arthralgia has resolved.   Noted to have decreased left hand grip on 10/19, MRI showed large epidural hematoma, patient s/p clot evacuation.  He was place back on anticoagulation argatroban on 10-22. He was notice to be more bradycardic with HR in the 40 on 10-23. Cardiology was consulted, and argatroban was transition to bivalirrubin to avoid bradycardia. His HR improved after argatroban was discontinue. -HIT panel0.486  (mildly elevated)- SRA (serotonin release assay) negative. Dr Marin Olp has been helping with patient care.   Patient developed weakness of left arm on 10-25. He was take emergently to OR for recurrence of epidural hematoma on 10-25.  He was taken off of anticoagulation.  Patient the night of 10-28 develops left arm weakness, numbness. Stat MRI was ordered, which confirm recurrent hematoma. Patient was taken to OR for hematoma evacuation 10-28. For his thrombocytopenia, a Bone marrow biopsy was done. For LE DVT, he has IVC filter. Patient has received multiple platelet transfusions and Nplate.  Patient underwent CT scan of his chest on 11/13 which showed new pulmonary embolism.  Patient was started on bivalirudin after discussions between hematology as well as neurosurgery.  Platelet counts appear to be improving.   On 11/21 pt 's CXR showed elevated hemidiaphragm and it was followed up with a abd x ray showed large free intraperitoneal air. Surgery consulted.   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  Status post cervical decompressive surgery on 06/14/2018 Status post evacuation of large hematoma and placement of Hemovac drain on 06/22/2018. Patient with decreased left sided handgrip and weakness since 10/19. MRI of the C-spine shows large epidural hemo-following which he had several clot evacuation procedures done on 10/19, 10/35, 10/28.  Repeat MRI  on 07/23/2018 shows improvement in the fluid collection.  Neurosurgery continues to follow and they plan to taper steroids.  Discussed with Dr. Saintclair Halsted who plans to refer the patient to spine rehab center in [place]. Acute pulmonary embolism/DVT in the setting of spine surgery Status post IVC filter placement on March 28, 2018 patient has been on multiple anticoagulants which has led to recurrent episodes of hematomas at the surgical sites requiring further evacuations.  Case was discussed with vascular surgery Dr. Lonni Fix, interventional radiologist they recommended that mechanical thrombectomy and thrombolysis is not on the cards.  He was kept off anticoagulation for a brief.  Due to recurrent bleeding.  However a CAT scan on the November 13 showed new pulmonary embolism with evidence of right heart.  He was subsequently started on bivalrudin.  So far he has no signs of bleeding.  And platelet counts are improving.  Plan to continue current management until the end of the week and PCP can be transitioned to oral anticoagulation on discharge.     Thrombocytopenia/coagulopathy Patient had extensive work-up for acute thrombocytopenia.   cardiolipin antibody lupus anticoagulant, homocysteine levels factor VIII levels all within normal limits.  Protein S activity was low, protein C activity was greater than 200.  HIT panel was mildly elevated but serotonin release assay was negative.  Over the course of his hospitalization patient had multiple platelet transfusions and his platelet count has slowly increased with endplate infusions on weekly basis.   Hyperglycemia secondary to steroids Currently on Lantus and sliding scale insulin. Hemoglobin A1c was 5.8 when last checked. NovoLog 3 units 3 times daily AC was added CBGs greater than 250 to be given when he was getting Ensure supplements. CBG (last 3)  Recent Labs    08/02/18 2208 08/03/18 0757 08/03/18 1134  GLUCAP 265* 113* 153*       Anemia  of chronic disease he did get 1 unit of PRBC transfusion during this hospitalization. Hemoglobin has been stable thereafter.   Orthostatic hypotension with near syncope Thought to be secondary to hypovolemia not autonomic dysfunction. Echocardiogram shows grade 2 diastolic dysfunction. Midodrine was added and his blood pressure parameters have been within normal limits.    Sinus bradycardia Is currently asymptomatic, TSH was within normal limits.  Plan to avoid AV nodal blocking agents.    Polyarthralgias Resolved   Fluid overload with upper and lower extremity edema patient has diuresed used up to 16 liters since admission with IV Lasix. Increased the lasix to q12 hours.   Adjustment disorder with depression and anxiety Resume Klonopin as needed.    Essential hypertension well controlled    Acute renal failure Resolved    Stage II pressure ulcer on the right ischial tuberosity Wound care consulted and recommendations given.    Oropharyngeal candidiasis He was given a course of fluconazole.   Acute diarrhea Resolved   Large free intra peritoneal air on abd x ray:  Pt currently asymptomatic. No nausea, vomiting or abdominal pain.  Get CT abd and pelvis for further evaluation. Discussed the results of the abnormal abd x ray with the patient.  Surgery consulted.   DVT prophylaxis: ted stockings.  Code Status:  Full code Family Communication: none at bedside.  Disposition Plan: pending clinical improvement.    Consultants:   NS Dr Saintclair Halsted  Hematology  Dr Marin Olp.   Cardiology.    Procedures:  On 10/2 1. posterior cervical decompressive laminectomy at C3, C4, C5 with removal of the  spinous process and complete laminectomy and foraminotomies of the C3, C4, C5 nerve roots. 2. Posterior cervical fusion with lateral mass screws at C3-C4-C5 and pars screws at C2 lies in the globus ellipseLateral mass screw system 3. Posterior lateral arthrodesis C2-C5  utilizing locally harvested autograft mixed with vivigen  06/22/2018: Procedure reexploration posterior cervical wound for evacuation of epidural hematoma  06/26/2018 DVT ultrasound:Right: Findings consistent with acute deep vein thrombosis involving the right common femoral vein, right femoral vein, right popliteal vein, right posterior tibial vein, and right peroneal vein. Thrombosis extends proximally into external iliac vein.  Proximal iliac vein not visualized. Left: Findings consistent with acute deep vein thrombosis involving the left common femoral vein, left femoral vein, left popliteal vein, left posterior tibial vein, and left peroneal vein. Findings consistent with acute superficial vein thrombosis  involving the left great saphenous vein. Thrombosis extends proximally into external iliac vein. Proximal iliac vein not visualized.  07/01/18 Clot evacuation   RUE doppler 07/02/18 Antimicrobials: completed ancef.   Subjective: No new complaints. No nausea, vomiting or abdominal pain or distention.   Objective: Vitals:   08/03/18 0300 08/03/18 0755 08/03/18 1135 08/03/18 1225  BP: 107/74 108/72 93/77 100/70  Pulse:  93 (!) 102 98  Resp:  _0 Temp: 98.1 F (36.7 C) 98.8 F (37.1 C) 98.4 F (36.9 C)   TempSrc: Oral Oral Oral   SpO2: 97% 99% 98% 97%  Weight:      Height:        Intake/Output Summary (Last 24 hours) at 08/03/2018 1327 Last data filed at 08/03/2018 1300 Gross per 24 hour  Intake 3144.18 ml  Output 1625 ml  Net 1519.18 ml   Filed Weights   07/07/18 1000 07/18/18 0600 07/27/18 1502  Weight: 123.8 kg 135 kg 127.6 kg    Examination:  General exam: Appears calm and comfortable  Respiratory system: diminished at bases, no wheezing or rhonchi.  Cardiovascular system: S1 & S2 heard, RRR. No JVD,  Gastrointestinal system: Abdomen is soft non tender non distended bowel sounds good.  Central nervous system: Alert and oriented. Left upper and  lower extremity weakness persistent. Extremities: pedal edema present.  Skin: right tibial ischial tuberosity stage 2 pressure ulcer.  Psychiatry:  Mood & affect appropriate.     Data Reviewed: I have personally reviewed following labs and imaging studies  CBC: Recent Labs  Lab 07/30/18 0719 07/31/18 0440 08/01/18 0440 08/02/18 0429 08/03/18 0450  WBC 4.4 5.2 5.5 6.1 6.3  HGB 9.2* 9.6* 9.4* 9.1* 9.0*  HCT 29.9* 31.5* 30.4* 30.2* 28.3*  MCV 98.7 97.5 96.8 99.0 97.6  PLT 63* 85* 112* 133* 737   Basic Metabolic Panel: Recent Labs  Lab 07/29/18 0242 07/31/18 0440 08/02/18 0429 08/03/18 0846  NA 139 142 140 140  K 3.7 3.9 4.0 3.8  CL 103 105 104 104  CO2 _1 GLUCOSE 375* 227* 214* 127*  BUN 52* 37* 34* 31*  CREATININE 0.95 0.64 0.72 0.66  CALCIUM 9.0 9.3 9.0 8.9   GFR: Estimated Creatinine Clearance: 142.8 mL/min (by C-G formula based on SCr of 0.66 mg/dL). Liver Function Tests: Recent Labs  Lab 08/03/18 0846  AST 34  ALT 27  ALKPHOS 60  BILITOT 0.8  PROT 5.5*  ALBUMIN 2.8*   No results for input(s): LIPASE, AMYLASE in the last 168 hours. No results for input(s): AMMONIA in the last 168 hours. Coagulation Profile: No results for input(s): INR, PROTIME in the last  168 hours. Cardiac Enzymes: No results for input(s): CKTOTAL, CKMB, CKMBINDEX, TROPONINI in the last 168 hours. BNP (last 3 results) No results for input(s): PROBNP in the last 8760 hours. HbA1C: No results for input(s): HGBA1C in the last 72 hours. CBG: Recent Labs  Lab 08/02/18 1115 08/02/18 1733 08/02/18 2208 08/03/18 0757 08/03/18 1134  GLUCAP 307* 399* 265* 113* 153*   Lipid Profile: No results for input(s): CHOL, HDL, LDLCALC, TRIG, CHOLHDL, LDLDIRECT in the last 72 hours. Thyroid Function Tests: No results for input(s): TSH, T4TOTAL, FREET4, T3FREE, THYROIDAB in the last 72 hours. Anemia Panel: No results for input(s): VITAMINB12, FOLATE, FERRITIN, TIBC, IRON, RETICCTPCT  in the last 72 hours. Sepsis Labs: Recent Labs  Lab 07/28/18 0139 07/29/18 0242  PROCALCITON 0.38 0.70    No results found for this or any previous visit (from the past 240 hour(s)).       Radiology Studies: Dg Chest Port 1 View  Result Date: 08/03/2018 CLINICAL DATA:  Fever. EXAM: PORTABLE CHEST 1 VIEW COMPARISON:  04/26/2018 in 07/2011 19 FINDINGS: Heart size is normal. There is subsegmental atelectasis or early infiltrate in the LEFT LOWER lobe. Mild focal atelectasis in the MEDIAL RIGHT lung base. Patient has a LEFT-sided PICC line, tip overlying the level of superior vena cava. There is elevation of LEFT hemidiaphragm. Subdiaphragmatic lucency may indicate distended stomach. Given the appearance, is difficult to entirely exclude free intraperitoneal air, and correlation is recommended with any history of abdominal symptoms. The IMPRESSION: 1. LEFT LOWER lobe atelectasis or early infiltrate. 2. RIGHT LOWER lobe atelectasis. 3. Elevated LEFT hemidiaphragm with question of free intraperitoneal air. Alternatively this may represent dilated, gas-filled stomach or loop of colon. Consider further evaluation with a decubitus view of the abdomen if there are abdominal symptoms. These results will be called to the ordering clinician or representative by the Radiologist Assistant, and communication documented in the PACS or zVision Dashboard. Electronically Signed   By: Nolon Nations M.D.   On: 08/03/2018 08:52   Dg Abd 2 Views  Result Date: 08/03/2018 CLINICAL DATA:  Ileus EXAM: ABDOMEN - 2 VIEW COMPARISON:  07/28/2018 CT abdomen FINDINGS: There is a large amount of free intraperitoneal air throughout the upper abdomen bilaterally. No dilated small bowel loops. Large amount of stool throughout the colon. No radiopaque nephrolithiasis. Stable elevation of the left hemidiaphragm with left basilar atelectasis. IVC filter overlies the medial right abdomen. IMPRESSION: 1. Large amount of free  intraperitoneal air throughout the upper abdomen bilaterally, new since 07/28/2018 CT abdomen study. Perforated viscus is the diagnosis of exclusion. 2. Nonobstructive bowel gas pattern. 3. Large colonic stool volume constipation. 4. Suggesting stable elevation of the left hemidiaphragm with left basilar atelectasis. Critical Value/emergent results were called by telephone at the time of interpretation on 08/03/2018 at 11:22 am to Dr. Hosie Poisson , who verbally acknowledged these results. Electronically Signed   By: Ilona Sorrel M.D.   On: 08/03/2018 11:26        Scheduled Meds: . Chlorhexidine Gluconate Cloth  6 each Topical Q0600  . clonazePAM  0.5 mg Oral BID  . dexamethasone  2 mg Intravenous Q24H  . feeding supplement (ENSURE ENLIVE)  237 mL Oral QID  . furosemide  40 mg Intravenous Q12H  . gabapentin  300 mg Oral TID  . Gerhardt's butt cream   Topical BID  . Influenza vac split quadrivalent PF  0.5 mL Intramuscular Tomorrow-1000  . insulin aspart  0-15 Units Subcutaneous TID WC  . insulin  aspart  0-5 Units Subcutaneous QHS  . insulin aspart  3 Units Subcutaneous TID WC  . insulin glargine  35 Units Subcutaneous QHS  . lipase/protease/amylase  36,000 Units Oral TID AC  . magic mouthwash w/lidocaine  5 mL Oral TID  . methocarbamol  750 mg Oral QID  . midodrine  5 mg Oral BID WC  . multivitamin with minerals  1 tablet Oral Daily  . saccharomyces boulardii  250 mg Oral BID  . sodium chloride flush  10-40 mL Intracatheter Q12H   Continuous Infusions: . sodium chloride 10 mL/hr at 07/25/18 2300  . bivalirudin (ANGIOMAX) infusion 0.5 mg/mL (Non-ACS indications) 0.22 mg/kg/hr (08/03/18 0850)     LOS: 57 days    Time spent: 25 minutes.     Hosie Poisson, MD [facility] Pager [phone]  If 7PM-7AM, please contact night-coverage [email or web address] Password [email or web address] 08/03/2018, 1:27 PM

## 2018-08-03 NOTE — Progress Notes (Signed)
So far, [name] is doing well with the bivalirudin.  He has had no issues with bleeding.  His PTT is being managed by pharmacy.  I am absolutely amazed as his platelet count is now 160,000.  I still do not have a reason for this.  May be, when the medicines he was on that we stopped was the reason for his trauma cytopenia.  His LDH has come down a little bit.  We will try to move ahead and switch him over to oral therapy tomorrow for the thromboembolic issues.  He does sound a little bit "wet" today.  I think chest x-ray would be reasonable.  He may need to have a little bit of Lasix.  His net output has been over 16 L.  He is having diarrhea.  He says whenever he eats food goes "right through me."  I am not sure what is going on there.  I would make sure that he is on a probiotic.  I will make sure that he is on may be Creon to try to help his pancreas.  I do not see any problems with him having any pain.  Everything seems to be coming along quite nicely right now.  Hopefully, we will be able to get him over to oral anticoagulation tomorrow.  I think he would be happy not having to have his PTT checked 4 times a day.  Christin BachPete Krystian Ferrentino, MD  Psalm 91:1-2

## 2018-08-03 NOTE — Progress Notes (Signed)
Called CT to check on pt status for CT abd pelvis w contrast, still waiting at this time as multiple STAT orders from ED.

## 2018-08-03 NOTE — Progress Notes (Signed)
Patient ID: [name] [name], male   DOB: 12-Jun-1966, 52 y.o.   MRN: [id number] Overall patient seems stable both mentally and physically.  Continue to work on rehabilitation think that as soon as we can transition the patient from IV blood thinners to oral able help his mental status and cut down on the blood draws. In addition discussed with the nurse reevaluation by nutrition and diabetic coordinator to stabilize his diet and sugars  once this is stable certainly think the patient would benefit from [facility] if he is a candidate.

## 2018-08-03 NOTE — Progress Notes (Signed)
ANTICOAGULATION CONSULT NOTE -  Consult follow-up Note   Pharmacy Consult for Bivaliruidin Indication: pulmonary embolus and bilateral DVT  Allergies  Allergen Reactions  . Nsaids Other (See Comments)    G6PD DEFICIENCY  . Sulfa Antibiotics Other (See Comments)    G6PD DEFICIENCY  . Protonix [Pantoprazole Sodium] Diarrhea    Severe diarrhea, Upset stomach    Patient Measurements: Height: 5\' 9"  (175.3 cm) Weight: 281 lb 4.9 oz (127.6 kg) IBW/kg (Calculated) : 70.7 Bivalirudin Dosing Weight: 127.6 kg  Vital Signs: Temp: 98.4 F (36.9 C) (11/21 1135) Temp Source: Oral (11/21 1135) BP: 93/77 (11/21 1135) Pulse Rate: 102 (11/21 1135)  Labs: Recent Labs    08/01/18 0440  08/02/18 0429  08/02/18 1650 08/02/18 2204 08/03/18 0450 08/03/18 0846  HGB 9.4*  --  9.1*  --   --   --  9.0*  --   HCT 30.4*  --  30.2*  --   --   --  28.3*  --   PLT 112*  --  133*  --   --   --  160  --   APTT 49*   < >  --    < > 58* 54* 54*  --   CREATININE  --   --  0.72  --   --   --   --  0.66   < > = values in this interval not displayed.    Estimated Creatinine Clearance: 142.8 mL/min (by C-G formula based on SCr of 0.66 mg/dL).  Assessment:  52 yr old male with new PE per 11/13 CTA and bilateral DVT per duplex today, and thrombocytpenia. Has IVC filter.  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  Last MRI (11/10) did not show evidence of compressive hematoma.  Hx PE and DVT and was on Eliquis prior to admit. Dr. Myna HidalgoEnnever and Dr. Wynetta Emeryram discussed risk/benefit and to begin Bivalirudin with low therapeutic goal.  Previously on Argatroban 10/22>10/23 then Bivalirudin 10/23>10/25. Noted switched due to Argatroban possibly contributing to bradycardia and hypotension. Pharmacy has been consulted to restart Bivalirudin.   APTT remains therapeutic. No active bleeding issues documented. Hg low but stable, plt improved to 160, LDH down a bit.  Goal of  Therapy:  aPTT 50-67 seconds (lower end of usual range 50-85 seconds) seconds Monitor platelets by anticoagulation protocol: Yes   Plan:  Continue Bivalirudin at 0.22 mg/kg/hr Daily aPTT/CBC Monitor for any signs or symptoms of bleeding F/u Oncology plans - transition to DOAC tomorrow per notes  Babs BertinHaley Zafir Schauer, PharmD, BCPS Clinical Pharmacist Clinical phone [phone] Please check AMION for all [facility] contact numbers 08/03/2018 12:00 PM

## 2018-08-03 NOTE — Progress Notes (Signed)
PT Cancellation Note  Patient Details Name: [name] [name] MRN: [id number] DOB: 02-07-1966   Cancelled Treatment:    Reason Eval/Treat Not Completed: Other (comment).  Pt concerned about the "air in my stomach" and pending CT scan.  He did not want to work with therapy today.  I did get him the yellow egg crates for postioning/pressure relief and a wedge from the ICU for positioning.  PT will follow up on Monday 08/07/18.  Thanks,   Rollene Rotundaebecca B. Kahliyah Dick, PT, DPT  Acute Rehabilitation [phone] pager [phone] office     Lurena Joinerebecca B Leta Bucklin 08/03/2018, 3:01 PM

## 2018-08-04 LAB — COMPREHENSIVE METABOLIC PANEL
ALBUMIN: 2.7 g/dL — AB (ref 3.5–5.0)
ALT: 27 U/L (ref 0–44)
AST: 38 U/L (ref 15–41)
Alkaline Phosphatase: 54 U/L (ref 38–126)
Anion gap: 8 (ref 5–15)
BUN: 28 mg/dL — AB (ref 6–20)
CHLORIDE: 106 mmol/L (ref 98–111)
CO2: 27 mmol/L (ref 22–32)
CREATININE: 0.6 mg/dL — AB (ref 0.61–1.24)
Calcium: 8.7 mg/dL — ABNORMAL LOW (ref 8.9–10.3)
GFR calc Af Amer: >60 mL/min (ref >60)
Glucose, Bld: 71 mg/dL (ref 70–99)
POTASSIUM: 4 mmol/L (ref 3.5–5.1)
Sodium: 141 mmol/L (ref 135–145)
Total Bilirubin: 0.7 mg/dL (ref 0.3–1.2)
Total Protein: 5.5 g/dL — ABNORMAL LOW (ref 6.5–8.1)

## 2018-08-04 LAB — CBC
HEMATOCRIT: 28.8 % — AB (ref 39.0–52.0)
HEMOGLOBIN: 9.1 g/dL — AB (ref 13.0–17.0)
MCH: 30 pg (ref 26.0–34.0)
MCHC: 31.6 g/dL (ref 30.0–36.0)
MCV: 95 fL (ref 80.0–100.0)
Platelets: 224 10*3/uL (ref 150–400)
RBC: 3.03 MIL/uL — ABNORMAL LOW (ref 4.22–5.81)
RDW: 18.3 % — AB (ref 11.5–15.5)
WBC: 8.2 10*3/uL (ref 4.0–10.5)
nRBC: 3.7 % — ABNORMAL HIGH (ref 0.0–0.2)

## 2018-08-04 LAB — GLUCOSE, CAPILLARY
GLUCOSE-CAPILLARY: 78 mg/dL (ref 70–99)
Glucose-Capillary: 85 mg/dL (ref 70–99)
Glucose-Capillary: 153 mg/dL — ABNORMAL HIGH (ref 70–99)
Glucose-Capillary: 183 mg/dL — ABNORMAL HIGH (ref 70–99)
Glucose-Capillary: 134 mg/dL — ABNORMAL HIGH (ref 70–99)

## 2018-08-04 LAB — LACTATE DEHYDROGENASE: LDH: 518 U/L — ABNORMAL HIGH (ref 98–192)

## 2018-08-04 LAB — APTT: aPTT: 70 seconds — ABNORMAL HIGH (ref 24–36)

## 2018-08-04 MED ORDER — IPRATROPIUM-ALBUTEROL 0.5-2.5 (3) MG/3ML IN SOLN
3.0000 mL | Freq: Four times a day (QID) | RESPIRATORY_TRACT | Status: DC
  Administered 2018-08-04 (×2): 3 mL via RESPIRATORY_TRACT
  Filled 2018-08-04 (×2): qty 3

## 2018-08-04 MED ORDER — IPRATROPIUM-ALBUTEROL 0.5-2.5 (3) MG/3ML IN SOLN: 3.0000 mL | Freq: Four times a day (QID) | RESPIRATORY_TRACT | Status: DC | PRN

## 2018-08-04 MED ORDER — SODIUM CHLORIDE 0.9 % IV SOLN
40.0000 mg | Freq: Two times a day (BID) | INTRAVENOUS | Status: DC
  Administered 2018-08-05 – 2018-08-08 (×5): 40 mg via INTRAVENOUS
  Filled 2018-08-04 (×12): qty 4

## 2018-08-04 MED ORDER — IPRATROPIUM-ALBUTEROL 0.5-2.5 (3) MG/3ML IN SOLN
3.0000 mL | Freq: Three times a day (TID) | RESPIRATORY_TRACT | Status: DC
  Administered 2018-08-04 – 2018-08-05 (×3): 3 mL via RESPIRATORY_TRACT
  Filled 2018-08-04 (×4): qty 3

## 2018-08-04 MED ORDER — APIXABAN 5 MG PO TABS
5.0000 mg | ORAL_TABLET | Freq: Two times a day (BID) | ORAL | Status: DC
  Administered 2018-08-04 – 2018-08-06 (×4): 5 mg via ORAL
  Filled 2018-08-04 (×4): qty 1

## 2018-08-04 MED ORDER — LEVOFLOXACIN IN D5W 500 MG/100ML IV SOLN
500.0000 mg | INTRAVENOUS | Status: DC
  Administered 2018-08-04 – 2018-08-05 (×2): 500 mg via INTRAVENOUS
  Filled 2018-08-04 (×2): qty 100

## 2018-08-04 NOTE — Progress Notes (Addendum)
New problem may be occurring.  He had a chest x-ray done yesterday.  This is because of a cough and what sounds like some congestion.  Chest x-ray showed left lower lobe atelectasis or early infiltrate.  There is right lower lobe atelectasis.  There was an elevated left hemidiaphragm.  Because of the elevated left hemidiaphragm, he had an abdominal x-ray.  This showed a large amount of free intraperitoneal air.  No dilated small bowel loops.  He had some constipation.  A CT scan was then done.  This showed the free air.  I do not think any else was obvious.  He is not symptomatic from my point of view.  He says that he is having some abdominal pain right now.  He was supposed to be changed over to oral anticoagulation today.  However, I think we are going to have to put this on hold until this intra-abdominal free air is sorted out.  Again I am not sure why he would have this.  He has been eating without any problems.  He has been having bowel movements.  He has had no fever.  There are no labs yet back from this morning.  His platelet count was 160,000 yesterday.  I worry about his atelectasis and possible pneumonia.  I will start him on some nebulizers.  I think that he probably needs some empiric antibiotics.  I will give him some Levaquin.  He is afebrile.  His blood pressure is 101/59.  His pulses 95.  His oxygen saturation was 97%.  His lungs showed some congestion at the bases bilaterally.  He had some rhonchi.  There may been some slight wheezes.  Cardiac exam regular rate and rhythm.  Abdomen is soft.  Abdomen is not distended.  Bowel sounds are present and slightly decreased.  There does not seem to be any guarding or rebound tenderness.  There is no fluid wave.  Extremities shows some chronic mild swelling in his feet.  Again, I am not sure what the significance is of this abdominal free air.  He has been asymptomatic with this.  I am not sure if this is some kind of surgical problem.  I  am sure that surgery will have to see him.  I will not switch him over to oral anticoagulation yet.  I just feel bad that he seems to keep developing one thing after another.  Again I just am not sure how this could have happened.  Again he certainly does not have any symptoms that would be referrable to some type of perforation.  I do not know if there is any kind of oral dye study that he needs to do to try to see where there might be a possible perforation.  I know that he is incredibly frustrated by all of this.  I definitely can understand this.  I will definitely continue to pray hard for him.  I really would like to have gotten him off IV bivalirudin and onto oral Eliquis.  This will happen but I just do not think it will happen today.  [name], MD  Jeri Modena 17:14   ADDENDUM: I have read the consult note from Dr. Derrell Lolling.  He does not feel that there is any surgical intervention that is necessary.  As such, we will now switch [name] over to oral anticoagulation.  I think that Eliquis would be good for him.  I will have pharmacy manage this.  I would not plan for a  loading dose since he has been on bivalirudin.  His platelet count today is 224,000.  This is coming up quite nicely.  I am not sure how to explain the elevated LDH.  He has no adenopathy in the abdomen.  His liver looks okay.  I think that by getting him off the IV bivalirudin, this will improve his quality of life so he will not had to be tested 3 or 4 times a day for the PTT.  I appreciate the help by Dr. Derrell LollingIngram.  I am had the had no surgery intervention is needed.  Christin BachPete Valbona Slabach, MD  Psalm 4:1

## 2018-08-04 NOTE — Progress Notes (Signed)
Occupational Therapy Treatment Patient Details Name: [name] [name] MRN: [id number] DOB: 23-Dec-1965 Today's Date: 08/04/2018    History of present illness 52 y.o. male admitted on 06/06/18 for bil LE weakness and muscle spasm after multiple falls (with resultant R knee meniscus tear placed in bledsoe brace) following a recent ACDF at C3 in July 2019 (complicated by PE (s/p IVC filter), CIR, and then SNF placement before finally returning home with his parents).  In the ED MRI of c-spine which showed post operative changes and cord edema and possibility of ischemia.  His R knee was aspirated on 06/09/18.  S/p posterior cervical decompression and fusion on 06/14/18. Entire acute course complicated by hypotension in standing (thought to be autonomic) and by finding of multiple LE DVTs (already has an IVC filter in place, but per MDs notes at risk for blocking the filter with these clots).  Pt with post op issues with incisional bleeding s/p 2 evacuations of hematomas and placement of hemovac (06/22/18 and 07/01/18) as well as multiple units of plasma.  Cardiology consulted 07/05/18 due to bradycardia thought to be from his newly started blood thinner.  Pt with another re-accumulation of epidural hematoma with increased L sided weakness and cord compression with emergent evacuation surgery 10/25 & 07/10/18.  Pt also needing more platelets post op.  Bone marrow bx 07/12/18.  Pt found to have submassive PE on 07/27/18 and re-started on anticoagulation. PMHx of HTN, gout, anemia, DDD (lumbar)   OT comments  Pt presents supine in bed pleasant and agreeable to therapy session today, requesting to focus on LUE. Performed A/AA/PROM to LUE across all planes both against gravity and gravity eliminated, initiating stretching as appropriate throughout as well. Pt with improved ability to perform digit flexion/extension, wrist extension, and some forearm pronation. Pt reports eating lunch using RUE without external assist  (use of hand held food). Pt also demonstrating use of RUE for occasional fine motor tasks on cell phone during session given increased effort and occasional minA. Pt with improved affect during session and looking forward to working with OT next week. POC remains appropriate at this time. Will continue to follow acutely.    Follow Up Recommendations  SNF;Supervision/Assistance - 24 hour    Equipment Recommendations  Other (comment)(defer to next venue)    Recommendations for Other Services      Precautions / Restrictions Precautions Precautions: Fall;Cervical;Other (comment) Precaution Comments: Several bed sores on bottom.   Required Braces or Orthoses: Cervical Brace Cervical Brace: Soft collar Restrictions Weight Bearing Restrictions: No       Mobility Bed Mobility                  Transfers                 General transfer comment: deferred this session; useof maximove for transfers OOB at this time    Balance                                           ADL either performed or assessed with clinical judgement   ADL Overall ADL's : Needs assistance/impaired                                       General ADL Comments: focus of session on LUE P/AAROM;  pt reports he ate lunch today and fed himself using RUE (bagel sandwich); pt also using RUE to show pictures to therapist on his phone given increased time/effort for fine motor movements, and with occasional assist     Vision       Perception     Praxis      Cognition Arousal/Alertness: Awake/alert Behavior During Therapy: WFL for tasks assessed/performed Overall Cognitive Status: Within Functional Limits for tasks assessed                                 General Comments: pt with good affect today        Exercises Exercises: General Upper Extremity General Exercises - Upper Extremity Shoulder Flexion: PROM;Left;20 reps;Supine Shoulder Extension:  PROM;Left;20 reps;Supine Shoulder Horizontal ABduction: PROM;Left;20 reps;Supine Shoulder Horizontal ADduction: PROM;Left;20 reps;Supine Elbow Flexion: PROM;AAROM;20 reps;Left;Supine Elbow Extension: PROM;AAROM;Left;20 reps;Supine Wrist Flexion: PROM;AAROM;20 reps;Left Wrist Extension: PROM;AAROM;Left;20 reps Digit Composite Flexion: AROM;20 reps;Left Composite Extension: AROM;Left;15 reps   Shoulder Instructions       General Comments      Pertinent Vitals/ Pain       Pain Assessment: Faces Faces Pain Scale: No hurt Pain Intervention(s): Monitored during session  Home Living                                          Prior Functioning/Environment              Frequency  Min 2X/week        Progress Toward Goals  OT Goals(current goals can now be found in the care plan section)  Progress towards OT goals: Progressing toward goals  Acute Rehab OT Goals Patient Stated Goal: "to get his camera arm back" OT Goal Formulation: With patient Time For Goal Achievement: 08/22/18 Potential to Achieve Goals: Fair  Plan Discharge plan remains appropriate    Co-evaluation                 AM-PAC PT "6 Clicks" Daily Activity     Outcome Measure   Help from another person eating meals?: A Little Help from another person taking care of personal grooming?: A Little Help from another person toileting, which includes using toliet, bedpan, or urinal?: Total Help from another person bathing (including washing, rinsing, drying)?: Total Help from another person to put on and taking off regular upper body clothing?: Total Help from another person to put on and taking off regular lower body clothing?: Total 6 Click Score: 10    End of Session Equipment Utilized During Treatment: Cervical collar  OT Visit Diagnosis: Other abnormalities of gait and mobility (R26.89);Muscle weakness (generalized) (M62.81);Pain   Activity Tolerance Patient tolerated  treatment well   Patient Left in bed;with call bell/phone within reach;with family/visitor present   Nurse Communication Mobility status        Time: 1610-9604 OT Time Calculation (min): 28 min  Charges: OT Treatments $Therapeutic Activity: 23-37 mins  Marcy Siren, OT [facility] Pager [phone] Office [phone]   [name] [name] 08/04/2018, 3:33 PM

## 2018-08-04 NOTE — Progress Notes (Signed)
25 Days Post-Op  Subjective: Denies abdominal pain.  Denies nausea.  Says he is ready to eat.  CT abdomen pelvis is negative for any pathologic cause for the pneumoperitoneum.  Moderate feces and probably needs of laxatives.  IVC filter.  WBC 6300.  Hemoglobin 9.0.  Platelet count 160.   Objective: Vital signs in last 24 hours: Temp:  [98.2 F (36.8 C)-98.7 F (37.1 C)] 98.2 F (36.8 C) (11/22 0721) Pulse Rate:  [92-102] 95 (11/22 0721) Resp:  [9-20] 14 (11/22 0721) BP: (93-105)/(59-77) 97/60 (11/22 0721) SpO2:  [90 %-98 %] 97 % (11/22 0758) Last BM Date: 08/02/18  Intake/Output from previous day: 11/21 0701 - 11/22 0700 In: 1528.8 [I.V.:1528.8] Out: 3175 [Urine:3175] Intake/Output this shift: No intake/output data recorded.  General appearance: Obese.  Alert.  No acute distress Neck: no adenopathy, no carotid bruit, no JVD, supple, symmetrical, trachea midline, thyroid not enlarged, symmetric, no tenderness/mass/nodules and Soft c-collar in place Resp: clear to auscultation bilaterally GI: soft, non-tender; bowel sounds normal; no masses,  no organomegaly Skin: Skin color, texture, turgor normal. No rashes or lesions  Lab Results:  Recent Labs    08/02/18 0429 08/03/18 0450  WBC 6.1 6.3  HGB 9.1* 9.0*  HCT 30.2* 28.3*  PLT 133* 160   BMET Recent Labs    08/02/18 0429 08/03/18 0846  NA 140 140  K 4.0 3.8  CL 104 104  CO2 25 28  GLUCOSE 214* 127*  BUN 34* 31*  CREATININE 0.72 0.66  CALCIUM 9.0 8.9   PT/INR No results for input(s): LABPROT, INR in the last 72 hours. ABG No results for input(s): PHART, HCO3 in the last 72 hours.  Invalid input(s): PCO2, PO2  Studies/Results: Ct Abdomen Pelvis W Contrast  Result Date: 08/04/2018 CLINICAL DATA:  Free air seen on x-ray. EXAM: CT ABDOMEN AND PELVIS WITH CONTRAST TECHNIQUE: Multidetector CT imaging of the abdomen and pelvis was performed using the standard protocol following bolus administration of  intravenous contrast. CONTRAST:  OMNIPAQUE IOHEXOL 300 MG/ML  SOLN COMPARISON:  KUB August 03, 2018.  CT scan July 28, 2018. FINDINGS: Lower chest: Left greater than right pulmonary opacities, unchanged. No other acute abnormalities in the lower chest. Hepatobiliary: The liver and portal vein are normal. The gallbladder is unremarkable. Pancreas: Unremarkable. No pancreatic ductal dilatation or surrounding inflammatory changes. Spleen: Normal in size without focal abnormality. Adrenals/Urinary Tract: The adrenal glands, kidneys, ureters, and bladder are unremarkable. Stomach/Bowel: The stomach and small bowel are normal. Moderate fecal loading in the colon. The colon and appendix are otherwise normal. Vascular/Lymphatic: IVC filter. The abdominal aorta is nonaneurysmal. No dissection or adenopathy. Reproductive: Prostate is unremarkable. Other: There is a large amount of free air in the abdomen. This was also seen on the August 03, 2018 KUB. The finding of free air was called to the referring clinical team after the KUB. Musculoskeletal: No acute or significant osseous findings. IMPRESSION: 1. No cause for the patient's known free intraperitoneal air is identified. The quantity of free air is large. The clinical team is already aware of the free air as discussed on the KUB from earlier today. 2. Bibasilar opacities, left greater than right. The right basilar opacity is almost certainly atelectasis. The left basilar opacity could represent atelectasis or infiltrate such as pneumonia. 3. Moderate fecal loading in the colon. 4. IVC filter. 5. No other abnormalities. Electronically Signed   By: Gerome Sam III M.D   On: 08/04/2018 00:41   Dg Chest [facility]  1 View  Result Date: 08/03/2018 CLINICAL DATA:  Fever. EXAM: PORTABLE CHEST 1 VIEW COMPARISON:  04/26/2018 in 07/2011 19 FINDINGS: Heart size is normal. There is subsegmental atelectasis or early infiltrate in the LEFT LOWER lobe. Mild focal  atelectasis in the MEDIAL RIGHT lung base. Patient has a LEFT-sided PICC line, tip overlying the level of superior vena cava. There is elevation of LEFT hemidiaphragm. Subdiaphragmatic lucency may indicate distended stomach. Given the appearance, is difficult to entirely exclude free intraperitoneal air, and correlation is recommended with any history of abdominal symptoms. The IMPRESSION: 1. LEFT LOWER lobe atelectasis or early infiltrate. 2. RIGHT LOWER lobe atelectasis. 3. Elevated LEFT hemidiaphragm with question of free intraperitoneal air. Alternatively this may represent dilated, gas-filled stomach or loop of colon. Consider further evaluation with a decubitus view of the abdomen if there are abdominal symptoms. These results will be called to the ordering clinician or representative by the Radiologist Assistant, and communication documented in the PACS or zVision Dashboard. Electronically Signed   By: Norva Pavlov M.D.   On: 08/03/2018 08:52   Dg Abd 2 Views  Result Date: 08/03/2018 CLINICAL DATA:  Ileus EXAM: ABDOMEN - 2 VIEW COMPARISON:  07/28/2018 CT abdomen FINDINGS: There is a large amount of free intraperitoneal air throughout the upper abdomen bilaterally. No dilated small bowel loops. Large amount of stool throughout the colon. No radiopaque nephrolithiasis. Stable elevation of the left hemidiaphragm with left basilar atelectasis. IVC filter overlies the medial right abdomen. IMPRESSION: 1. Large amount of free intraperitoneal air throughout the upper abdomen bilaterally, new since 07/28/2018 CT abdomen study. Perforated viscus is the diagnosis of exclusion. 2. Nonobstructive bowel gas pattern. 3. Large colonic stool volume constipation. 4. Suggesting stable elevation of the left hemidiaphragm with left basilar atelectasis. Critical Value/emergent results were called by telephone at the time of interpretation on 08/03/2018 at 11:22 am to Dr. Kathlen Mody , who verbally acknowledged these  results. Electronically Signed   By: Delbert Phenix M.D.   On: 08/03/2018 11:26    Anti-infectives: Anti-infectives (From admission, onward)   Start     Dose/Rate Route Frequency Ordered Stop   08/04/18 0645  levofloxacin (LEVAQUIN) IVPB 500 mg     500 mg 100 mL/hr over 60 Minutes Intravenous Every 24 hours 08/04/18 0642     07/25/18 1700  fluconazole (DIFLUCAN) IVPB 400 mg  Status:  Discontinued     400 mg 100 mL/hr over 120 Minutes Intravenous Every 24 hours 07/25/18 1633 07/27/18 1415   07/12/18 2100  ceFAZolin (ANCEF) IVPB 2g/100 mL premix  Status:  Discontinued     2 g 200 mL/hr over 30 Minutes Intravenous Every 8 hours 07/12/18 1555 07/15/18 1050   07/10/18 2229  bacitracin 50,000 Units in sodium chloride 0.9 % 500 mL irrigation  Status:  Discontinued       As needed 07/10/18 2230 07/10/18 2300   07/07/18 1133  bacitracin 50,000 Units in sodium chloride 0.9 % 500 mL irrigation  Status:  Discontinued       As needed 07/07/18 1133 07/07/18 1357   07/06/18 0900  ceFAZolin (ANCEF) IVPB 2g/100 mL premix  Status:  Discontinued     2 g 200 mL/hr over 30 Minutes Intravenous Every 8 hours 07/06/18 0810 07/12/18 1555   07/01/18 2240  bacitracin 50,000 Units in sodium chloride 0.9 % 500 mL irrigation  Status:  Discontinued       As needed 07/01/18 2240 07/01/18 2324   06/30/18 1330  doxycycline (VIBRA-TABS) tablet  100 mg  Status:  Discontinued     100 mg Oral Every 12 hours 06/30/18 1329 07/12/18 1429   06/27/18 1000  fluconazole (DIFLUCAN) tablet 200 mg  Status:  Discontinued     200 mg Oral Daily 06/27/18 0725 07/04/18 0720   06/22/18 2315  ceFAZolin (ANCEF) IVPB 2g/100 mL premix     2 g 200 mL/hr over 30 Minutes Intravenous Every 8 hours 06/22/18 2314 06/27/18 1519   06/22/18 1828  bacitracin 50,000 Units in sodium chloride 0.9 % 500 mL irrigation  Status:  Discontinued       As needed 06/22/18 1828 06/22/18 1915   06/22/18 1700  ceFAZolin (ANCEF) 3 g in dextrose 5 % 50 mL IVPB  Status:   Discontinued     3 g 100 mL/hr over 30 Minutes Intravenous  Once 06/22/18 1647 06/22/18 2101   06/14/18 2100  ceFAZolin (ANCEF) IVPB 2g/100 mL premix     2 g 200 mL/hr over 30 Minutes Intravenous Every 8 hours 06/14/18 1712 06/17/18 0800   06/14/18 1414  bacitracin 50,000 Units in sodium chloride 0.9 % 500 mL irrigation  Status:  Discontinued       As needed 06/14/18 1414 06/14/18 1647   06/14/18 1215  ceFAZolin (ANCEF) 3 g in dextrose 5 % 50 mL IVPB     3 g 160 mL/hr over 30 Minutes Intravenous To [facility] 06/14/18 1202 06/14/18 2225   06/14/18 1200  ceFAZolin (ANCEF) IVPB 2g/100 mL premix  Status:  Discontinued     2 g 200 mL/hr over 30 Minutes Intravenous On call to O.R. 06/14/18 1157 06/14/18 2140      Assessment/Plan: s/p Procedure(s): POSTERIOR CERVICAL HEMATOMA EVACUATION  Pneumoperitoneum.  Clinically and radiographically this is benign and does not require any intervention.  Exact cause unclear. He may resume diet  Anterior cervical corpectomy of C4.  Complicated postop course with multiple repeat operations Multiple PEs and DVTs.  IVC filter in place.  On bivalirudin Thrombocytopenia, coagulopathy.  Being followed by Dr. Myna HidalgoEnnever  Surgery will sign off.   LOS: 58 days    Ernestene MentionHaywood M Bow Buntyn 08/04/2018

## 2018-08-04 NOTE — Progress Notes (Signed)
Pt frustrated with lack of knowledge being shared about plan of care.   ChiropodistAssistant Director came in and spoke with patient related to concerns.   Physician then came and spoke to patient about changing angiomax to eliquis for a 48 hour trial, about social worker coming to speak with patient regarding neuro rehab in [place], and answered other questions patient had.

## 2018-08-04 NOTE — Progress Notes (Signed)
Met with pt and his parents to discuss possible referral to [facility] in [place].  Pt and his parents are in favor of referral; feel that this would be patient's best option for recovery.  Provided [facility] pamphlet and periodical for them to review.  Pt's father has been researching this hospital on the internet, and is very encouraged by the work and research they are doing.  Answered all of pt and his parent's questions to the best of my ability.  Will make contact with admissions liaison for [facility] and fax referral accordingly.  Will provide updates to pt, family and staff as available.    Julie W. Amerson, RN, BSN  Trauma/Neuro ICU Case Manager [phone] 

## 2018-08-04 NOTE — Progress Notes (Signed)
Dr. Blake DivineAkula had a conversation with patient this morning about changing angiomax to eliquis. Aptt was 70 per Pharmacy call, dose was reduced from 56.1 to 51 ml/hr.  A new order to redraw Aptt is set for 1600.   Dr. Blake DivineAkula was paged and informed on labs.  Eliquis BID added to patient MAR and angiomax was discontinued.

## 2018-08-04 NOTE — Progress Notes (Addendum)
ANTICOAGULATION CONSULT NOTE -  Consult follow-up Note   Pharmacy Consult for Bivaliruidin > apixaban Indication: pulmonary embolus and bilateral DVT  Allergies  Allergen Reactions  . Nsaids Other (See Comments)    G6PD DEFICIENCY  . Sulfa Antibiotics Other (See Comments)    G6PD DEFICIENCY  . Protonix [Pantoprazole Sodium] Diarrhea    Severe diarrhea, Upset stomach    Patient Measurements: Height: 5\' 9"  (175.3 cm) Weight: 281 lb 4.9 oz (127.6 kg) IBW/kg (Calculated) : 70.7 Bivalirudin Dosing Weight: 127.6 kg  Vital Signs: Temp: 98.2 F (36.8 C) (11/22 0721) Temp Source: Oral (11/22 0721) BP: 97/60 (11/22 0721) Pulse Rate: 95 (11/22 0721)  Labs: Recent Labs    08/02/18 0429  08/02/18 2204 08/03/18 0450 08/03/18 0846 08/04/18 0500  HGB 9.1*  --   --  9.0*  --  9.1*  HCT 30.2*  --   --  28.3*  --  28.8*  PLT 133*  --   --  160  --  224  APTT  --    < > 54* 54*  --  70*  CREATININE 0.72  --   --   --  0.66 0.60*   < > = values in this interval not displayed.    Estimated Creatinine Clearance: 142.8 mL/min (A) (by C-G formula based on SCr of 0.6 mg/dL (L)).  Assessment:  52 yr old male with new PE per 11/13 CTA and bilateral DVT per duplex today, and thrombocytpenia. Has IVC filter.  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  Last MRI (11/10) did not show evidence of compressive hematoma.  Hx PE and DVT and was on Eliquis prior to admit. Dr. Myna HidalgoEnnever and Dr. Wynetta Emeryram discussed risk/benefit and to begin Bivalirudin with low therapeutic goal.  Previously on Argatroban 10/22>10/23 then Bivalirudin 10/23>10/25. Noted switched due to Argatroban possibly contributing to bradycardia and hypotension. Pharmacy has been consulted to restart Bivalirudin.   APTT now slightly supratherapeutic at 70. No active bleeding or issues with the infusion issues reported. Hg low but stable, plt improved to 160 - CBC still pending for today, LDH up to  518.  Goal of Therapy:  aPTT 50-67 seconds (lower end of usual range 50-85 seconds) seconds Monitor platelets by anticoagulation protocol: Yes   Plan:  Decrease Bivalirudin to 0.2 mg/kg/hr 2h aPTT Daily aPTT/CBC Monitor for any signs or symptoms of bleeding F/u long-term anticoagulation plans - Per discussion with RN, plan is to likely transition to DOAC later today  Babs BertinHaley Tiombe Tomeo, PharmD, BCPS Clinical Pharmacist Clinical phone [phone] Please check AMION for all [facility] contact numbers 08/04/2018 10:48 AM   ADDENDUM: Pharmacy consulted to transition to apixaban - Oncology specifically requests no loading dose. Hg low but stable, plt up to 224. No active bleeding issues documented.  Plan: D/c bivalirudin at time of 1st dose of apixaban - communicated transition plan with RN via phone Start apixaban 5mg  PO BID (Oncology requests no loading dose) Monitor CBC, s/sx bleeding  Babs BertinHaley Darcy Barbara, PharmD, BCPS Please check AMION for all [facility] contact numbers Clinical Pharmacist 08/04/2018 2:37 PM

## 2018-08-04 NOTE — Progress Notes (Signed)
PROGRESS NOTE    [name] [name]  SJG:[id number] DOB: [birth year] DOA: 06/06/2018 PCP: Helane Rima, MD    Brief Narrative: 52 year old man with past medical history relevant for gout, hypertension anterior cervical corpectomy of C4 on 03/24/2018 with comp gated postoperative course including development of multiple PEs and DVTs started on anticoagulation and subsequently developed hematoma requiring reexploration on 03/28/2018 status post placement of IVC filter who was discharged to skilled nursing facility and had a complicated course including progressive lower extremity weakness, intermittent fevers of unclear etiology and hospitalizations for "pneumonia". Patient was admitted to this hospital is a transfer from [facility] with MRI of C-spine that showed postoperative changes and cord compression with cord edema and concern for ischemia. Patient is status post posterior cervical decompression laminectomy of C3, C4, C5, removal of spinous processes, foraminotomies on 06/14/2018. On 06/22/2018 patient began to develop worsening left sided paresthesias and weakness and a stat MRI showed evidence of postoperative hematoma. Patient was taken to the OR with evacuation of hematoma. Additionally his postoperative course has been complicated by recurrent fevers,bilateral lower extremity DVTs on ultrasound,migratory arthralgias/arthritis that are thought to be rheumatologic in nature as well as significant orthostasis limiting the amount he can work with physical therapy. His arthralgia has resolved.   Noted to have decreased left hand grip on 10/19, MRI showed large epidural hematoma, patient s/p clot evacuation.  He was place back on anticoagulation argatroban on 10-22. He was notice to be more bradycardic with HR in the 40 on 10-23. Cardiology was consulted, and argatroban was transition to bivalirrubin to avoid bradycardia. His HR improved after argatroban was discontinue. -HIT panel0.486  (mildly elevated)- SRA (serotonin release assay) negative. Dr Marin Olp has been helping with patient care.   Patient developed weakness of left arm on 10-25. He was take emergently to OR for recurrence of epidural hematoma on 10-25.  He was taken off of anticoagulation.  Patient the night of 10-28 develops left arm weakness, numbness. Stat MRI was ordered, which confirm recurrent hematoma. Patient was taken to OR for hematoma evacuation 10-28. For his thrombocytopenia, a Bone marrow biopsy was done. For LE DVT, he has IVC filter. Patient has received multiple platelet transfusions and Nplate.  Patient underwent CT scan of his chest on 11/13 which showed new pulmonary embolism.  Patient was started on bivalirudin after discussions between hematology as well as neurosurgery.  Platelet counts appear to be improving.   Pt seen and examined, no new complaints. Today.   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  Status post cervical decompressive surgery on 06/14/2018 Status post evacuation of large hematoma and placement of Hemovac drain on 06/22/2018. Patient with decreased left sided handgrip and weakness since 10/19. MRI of the C-spine shows large epidural hemo-following which he had several clot evacuation procedures done on 10/19, 10/35, 10/28.  Repeat MRI on 07/23/2018 shows improvement in the fluid collection.  Neurosurgery continues to follow and they  plan to taper steroids.  Discussed with Dr. Saintclair Halsted who plans to refer the patient to spine rehab center in [place]. Acute pulmonary embolism/DVT in the setting of spine surgery Status post IVC filter placement on March 28, 2018 patient has been on multiple anticoagulants which has led to recurrent episodes of hematomas at the surgical sites requiring further evacuations.  Case was discussed with vascular surgery Dr. Lonni Fix, interventional radiologist they recommended that mechanical thrombectomy and thrombolysis is not on the cards.  He was kept off anticoagulation for a brief.  Due to recurrent bleeding.  However a CAT scan on the November 13 showed new pulmonary embolism with evidence of right heart.  He was subsequently started on bivalrudin.  So far he has no signs of bleeding.  And platelet counts are improving.  His platelet count this am within normal limits and his angiomax was changed to Eliquis.     Thrombocytopenia/coagulopathy Patient had extensive work-up for acute thrombocytopenia.   cardiolipin antibody lupus anticoagulant, homocysteine levels factor VIII levels all within normal limits.  Protein S activity was low, protein C activity was greater than 200.  HIT panel was mildly elevated but serotonin release assay was negative.  Over the course of his hospitalization patient had multiple platelet transfusions and his platelet count has slowly increased with endplate infusions on weekly basis.   Hyperglycemia secondary to steroids Currently on Lantus and sliding scale insulin. Hemoglobin A1c was 5.8 when last checked. NovoLog 3 units 3 times daily AC was added CBGs greater than 250 to be given when he was getting Ensure supplements. CBG (last 3)  Recent Labs    08/04/18 0502 08/04/18 0729 08/04/18 1112  GLUCAP 85 78 153*       Anemia of chronic disease he did get 1 unit of PRBC transfusion during this hospitalization. Hemoglobin has been stable thereafter.   Orthostatic  hypotension with near syncope Thought to be secondary to hypovolemia not autonomic dysfunction. Echocardiogram shows grade 2 diastolic dysfunction. Midodrine was added and his blood pressure parameters have been within normal limits.    Sinus bradycardia Is currently asymptomatic, TSH was within normal limits.  Plan to avoid AV nodal blocking agents.    Polyarthralgias Resolved   Fluid overload with upper and lower extremity edema patient has diuresed used up to 16 liters since admission with IV Lasix. Increased the lasix to q12 hours. Good diuresis with IV lasix overnight.   Adjustment disorder with depression and anxiety Resume Klonopin as needed.    Essential hypertension well controlled    Acute renal failure Resolved    Stage II pressure ulcer on the right ischial tuberosity Wound care consulted and recommendations given.    Oropharyngeal candidiasis He was given a course of fluconazole.   Acute diarrhea Resolved   Large free intra peritoneal air on abd x ray:  Pt currently asymptomatic. No nausea, vomiting or abdominal pain.  CT abd and pelvis does not show any viscous perforation.  Discussed the results of the abnormal abd x ray with the patient.  Surgery consulted and recommended no further work up at this time.they have signed off. Started the patient on clear liquid diet and advance as tolerated.    DVT prophylaxis: ted stockings.  Code Status:  Full code Family Communication: none at bedside.  Disposition Plan: pending clinical improvement.    Consultants:   NS Dr Saintclair Halsted  Hematology  Dr Marin Olp.   Cardiology.    Procedures:  On 10/2 1. posterior cervical decompressive  laminectomy at C3, C4, C5 with removal of the spinous process and complete laminectomy and foraminotomies of the C3, C4, C5 nerve roots. 2. Posterior cervical fusion with lateral mass screws at C3-C4-C5 and pars screws at C2 lies in the globus ellipseLateral mass screw  system 3. Posterior lateral arthrodesis C2-C5 utilizing locally harvested autograft mixed with vivigen  06/22/2018: Procedure reexploration posterior cervical wound for evacuation of epidural hematoma  06/26/2018 DVT ultrasound:Right: Findings consistent with acute deep vein thrombosis involving the right common femoral vein, right femoral vein, right popliteal vein, right posterior tibial vein, and right peroneal vein. Thrombosis extends proximally into external iliac vein.  Proximal iliac vein not visualized. Left: Findings consistent with acute deep vein thrombosis involving the left common femoral vein, left femoral vein, left popliteal vein, left posterior tibial vein, and left peroneal vein. Findings consistent with acute superficial vein thrombosis  involving the left great saphenous vein. Thrombosis extends proximally into external iliac vein. Proximal iliac vein not visualized.  07/01/18 Clot evacuation   RUE doppler 07/02/18 Antimicrobials: completed ancef.   Subjective: Pt appears worried, but no chest pain or sob. No nausea or vomiting or abdominal distention or abdominal pain.   Objective: Vitals:   08/04/18 0721 08/04/18 0758 08/04/18 1105 08/04/18 1310  BP: 97/60  (!) 87/70   Pulse: 95  (!) 105   Resp: 14     Temp: 98.2 F (36.8 C)  99 F (37.2 C)   TempSrc: Oral  Oral   SpO2: 98% 97% 96% 97%  Weight:      Height:        Intake/Output Summary (Last 24 hours) at 08/04/2018 1528 Last data filed at 08/04/2018 1223 Gross per 24 hour  Intake 224.58 ml  Output 3500 ml  Net -3275.42 ml   Filed Weights   07/07/18 1000 07/18/18 0600 07/27/18 1502  Weight: 123.8 kg 135 kg 127.6 kg    Examination:  General exam: Appears calm and comfortable not in distress has the neck collar.  Respiratory system: clear to auscultation, but no wheezing or rhonchi.  Cardiovascular system: S1 & S2 heard, RRR. No JVD,  Gastrointestinal system: Abdomen is  Soft NT nd  bs+ Central nervous system: Alert and oriented. Left upper and lower extremity weakness improving.  Extremities: pedal edema present.  Skin: right tibial ischial tuberosity stage 2 pressure ulcer.  Psychiatry:  Mood & affect appropriate.     Data Reviewed: I have personally reviewed following labs and imaging studies  CBC: Recent Labs  Lab 07/31/18 0440 08/01/18 0440 08/02/18 0429 08/03/18 0450 08/04/18 0500  WBC 5.2 5.5 6.1 6.3 8.2  HGB 9.6* 9.4* 9.1* 9.0* 9.1*  HCT 31.5* 30.4* 30.2* 28.3* 28.8*  MCV 97.5 96.8 99.0 97.6 95.0  PLT 85* 112* 133* 160 161   Basic Metabolic Panel: Recent Labs  Lab 07/29/18 0242 07/31/18 0440 08/02/18 0429 08/03/18 0846 08/04/18 0500  NA 139 142 140 140 141  K 3.7 3.9 4.0 3.8 4.0  CL 103 105 104 104 106  CO2 _0 GLUCOSE 375* 227* 214* 127* 71  BUN 52* 37* 34* 31* 28*  CREATININE 0.95 0.64 0.72 0.66 0.60*  CALCIUM 9.0 9.3 9.0 8.9 8.7*   GFR: Estimated Creatinine Clearance: 142.8 mL/min (A) (by C-G formula based on SCr of 0.6 mg/dL (L)). Liver Function Tests: Recent Labs  Lab 08/03/18 0846 08/04/18 0500  AST 34 38  ALT 27 27  ALKPHOS 60 54  BILITOT 0.8 0.7  PROT  5.5* 5.5*  ALBUMIN 2.8* 2.7*   No results for input(s): LIPASE, AMYLASE in the last 168 hours. No results for input(s): AMMONIA in the last 168 hours. Coagulation Profile: No results for input(s): INR, PROTIME in the last 168 hours. Cardiac Enzymes: No results for input(s): CKTOTAL, CKMB, CKMBINDEX, TROPONINI in the last 168 hours. BNP (last 3 results) No results for input(s): PROBNP in the last 8760 hours. HbA1C: No results for input(s): HGBA1C in the last 72 hours. CBG: Recent Labs  Lab 08/03/18 1717 08/03/18 2204 08/04/18 0502 08/04/18 0729 08/04/18 1112  GLUCAP 116* 91 85 78 153*   Lipid Profile: No results for input(s): CHOL, HDL, LDLCALC, TRIG, CHOLHDL, LDLDIRECT in the last 72 hours. Thyroid Function Tests: No results for input(s): TSH,  T4TOTAL, FREET4, T3FREE, THYROIDAB in the last 72 hours. Anemia Panel: No results for input(s): VITAMINB12, FOLATE, FERRITIN, TIBC, IRON, RETICCTPCT in the last 72 hours. Sepsis Labs: Recent Labs  Lab 07/29/18 0242  PROCALCITON 0.70    No results found for this or any previous visit (from the past 240 hour(s)).       Radiology Studies: Ct Abdomen Pelvis W Contrast  Result Date: 08/04/2018 CLINICAL DATA:  Free air seen on x-ray. EXAM: CT ABDOMEN AND PELVIS WITH CONTRAST TECHNIQUE: Multidetector CT imaging of the abdomen and pelvis was performed using the standard protocol following bolus administration of intravenous contrast. CONTRAST:  117m OMNIPAQUE IOHEXOL 300 MG/ML  SOLN COMPARISON:  KUB August 03, 2018.  CT scan July 28, 2018. FINDINGS: Lower chest: Left greater than right pulmonary opacities, unchanged. No other acute abnormalities in the lower chest. Hepatobiliary: The liver and portal vein are normal. The gallbladder is unremarkable. Pancreas: Unremarkable. No pancreatic ductal dilatation or surrounding inflammatory changes. Spleen: Normal in size without focal abnormality. Adrenals/Urinary Tract: The adrenal glands, kidneys, ureters, and bladder are unremarkable. Stomach/Bowel: The stomach and small bowel are normal. Moderate fecal loading in the colon. The colon and appendix are otherwise normal. Vascular/Lymphatic: IVC filter. The abdominal aorta is nonaneurysmal. No dissection or adenopathy. Reproductive: Prostate is unremarkable. Other: There is a large amount of free air in the abdomen. This was also seen on the August 03, 2018 KUB. The finding of free air was called to the referring clinical team after the KUB. Musculoskeletal: No acute or significant osseous findings. IMPRESSION: 1. No cause for the patient's known free intraperitoneal air is identified. The quantity of free air is large. The clinical team is already aware of the free air as discussed on the KUB from  earlier today. 2. Bibasilar opacities, left greater than right. The right basilar opacity is almost certainly atelectasis. The left basilar opacity could represent atelectasis or infiltrate such as pneumonia. 3. Moderate fecal loading in the colon. 4. IVC filter. 5. No other abnormalities. Electronically Signed   By: DDorise BullionIII M.D   On: 08/04/2018 00:41   Dg Chest Port 1 View  Result Date: 08/03/2018 CLINICAL DATA:  Fever. EXAM: PORTABLE CHEST 1 VIEW COMPARISON:  04/26/2018 in 07/2011 19 FINDINGS: Heart size is normal. There is subsegmental atelectasis or early infiltrate in the LEFT LOWER lobe. Mild focal atelectasis in the MEDIAL RIGHT lung base. Patient has a LEFT-sided PICC line, tip overlying the level of superior vena cava. There is elevation of LEFT hemidiaphragm. Subdiaphragmatic lucency may indicate distended stomach. Given the appearance, is difficult to entirely exclude free intraperitoneal air, and correlation is recommended with any history of abdominal symptoms. The IMPRESSION: 1. LEFT LOWER lobe atelectasis  or early infiltrate. 2. RIGHT LOWER lobe atelectasis. 3. Elevated LEFT hemidiaphragm with question of free intraperitoneal air. Alternatively this may represent dilated, gas-filled stomach or loop of colon. Consider further evaluation with a decubitus view of the abdomen if there are abdominal symptoms. These results will be called to the ordering clinician or representative by the Radiologist Assistant, and communication documented in the PACS or zVision Dashboard. Electronically Signed   By: Nolon Nations M.D.   On: 08/03/2018 08:52   Dg Abd 2 Views  Result Date: 08/03/2018 CLINICAL DATA:  Ileus EXAM: ABDOMEN - 2 VIEW COMPARISON:  07/28/2018 CT abdomen FINDINGS: There is a large amount of free intraperitoneal air throughout the upper abdomen bilaterally. No dilated small bowel loops. Large amount of stool throughout the colon. No radiopaque nephrolithiasis. Stable elevation  of the left hemidiaphragm with left basilar atelectasis. IVC filter overlies the medial right abdomen. IMPRESSION: 1. Large amount of free intraperitoneal air throughout the upper abdomen bilaterally, new since 07/28/2018 CT abdomen study. Perforated viscus is the diagnosis of exclusion. 2. Nonobstructive bowel gas pattern. 3. Large colonic stool volume constipation. 4. Suggesting stable elevation of the left hemidiaphragm with left basilar atelectasis. Critical Value/emergent results were called by telephone at the time of interpretation on 08/03/2018 at 11:22 am to Dr. Hosie Poisson , who verbally acknowledged these results. Electronically Signed   By: Ilona Sorrel M.D.   On: 08/03/2018 11:26        Scheduled Meds: . apixaban  5 mg Oral BID  . Chlorhexidine Gluconate Cloth  6 each Topical Q0600  . clonazePAM  0.5 mg Oral BID  . feeding supplement (ENSURE ENLIVE)  237 mL Oral BID BM  . furosemide  40 mg Intravenous Q12H  . gabapentin  300 mg Oral TID  . Gerhardt's butt cream   Topical BID  . Influenza vac split quadrivalent PF  0.5 mL Intramuscular Tomorrow-1000  . insulin aspart  0-15 Units Subcutaneous TID WC  . insulin aspart  0-5 Units Subcutaneous QHS  . insulin aspart  3 Units Subcutaneous TID WC  . insulin glargine  35 Units Subcutaneous QHS  . ipratropium-albuterol  3 mL Nebulization Q6H  . lipase/protease/amylase  36,000 Units Oral TID AC  . magic mouthwash w/lidocaine  5 mL Oral TID  . methocarbamol  750 mg Oral QID  . midodrine  5 mg Oral BID WC  . multivitamin with minerals  1 tablet Oral Daily  . saccharomyces boulardii  250 mg Oral BID  . sodium chloride flush  10-40 mL Intracatheter Q12H   Continuous Infusions: . sodium chloride 10 mL/hr at 07/25/18 2300  . famotidine (PEPCID) IV    . levofloxacin (LEVAQUIN) IV       LOS: 58 days    Time spent: 25 minutes.     Hosie Poisson, MD [facility] Pager [phone]  If 7PM-7AM, please contact  night-coverage [email or web address] Password [facility] 08/04/2018, 3:28 PM

## 2018-08-05 ENCOUNTER — Inpatient Hospital Stay (HOSPITAL_COMMUNITY)

## 2018-08-05 LAB — COMPREHENSIVE METABOLIC PANEL
ALK PHOS: 77 U/L (ref 38–126)
ALT: 33 U/L (ref 0–44)
ANION GAP: 12 (ref 5–15)
AST: 47 U/L — ABNORMAL HIGH (ref 15–41)
Albumin: 2.7 g/dL — ABNORMAL LOW (ref 3.5–5.0)
BILIRUBIN TOTAL: 0.6 mg/dL (ref 0.3–1.2)
BUN: 26 mg/dL — ABNORMAL HIGH (ref 6–20)
CALCIUM: 8.3 mg/dL — AB (ref 8.9–10.3)
CO2: 26 mmol/L (ref 22–32)
CREATININE: 0.92 mg/dL (ref 0.61–1.24)
Chloride: 98 mmol/L (ref 98–111)
GFR calc non Af Amer: >60 mL/min (ref >60)
Glucose, Bld: 247 mg/dL — ABNORMAL HIGH (ref 70–99)
Potassium: 3.9 mmol/L (ref 3.5–5.1)
SODIUM: 136 mmol/L (ref 135–145)
Total Protein: 5.5 g/dL — ABNORMAL LOW (ref 6.5–8.1)

## 2018-08-05 LAB — CBC
HCT: 28.3 % — ABNORMAL LOW (ref 39.0–52.0)
Hemoglobin: 8.5 g/dL — ABNORMAL LOW (ref 13.0–17.0)
MCH: 30.6 pg (ref 26.0–34.0)
MCHC: 30 g/dL (ref 30.0–36.0)
MCV: 101.8 fL — AB (ref 80.0–100.0)
NRBC: 4.7 % — AB (ref 0.0–0.2)
PLATELETS: 210 10*3/uL (ref 150–400)
RBC: 2.78 MIL/uL — AB (ref 4.22–5.81)
RDW: 18.8 % — AB (ref 11.5–15.5)
WBC: 8.9 10*3/uL (ref 4.0–10.5)

## 2018-08-05 LAB — LACTIC ACID, PLASMA
LACTIC ACID, VENOUS: 5 mmol/L — AB (ref 0.5–1.9)
LACTIC ACID, VENOUS: 3 mmol/L — AB (ref 0.5–1.9)

## 2018-08-05 LAB — LACTATE DEHYDROGENASE: LDH: 509 U/L — ABNORMAL HIGH (ref 98–192)

## 2018-08-05 LAB — GLUCOSE, CAPILLARY
GLUCOSE-CAPILLARY: 211 mg/dL — AB (ref 70–99)
GLUCOSE-CAPILLARY: 197 mg/dL — AB (ref 70–99)
Glucose-Capillary: 180 mg/dL — ABNORMAL HIGH (ref 70–99)
Glucose-Capillary: 196 mg/dL — ABNORMAL HIGH (ref 70–99)

## 2018-08-05 MED ORDER — ALTEPLASE 2 MG IJ SOLR
2.0000 mg | Freq: Once | INTRAMUSCULAR | Status: AC
  Administered 2018-08-05: 2 mg

## 2018-08-05 MED ORDER — SODIUM CHLORIDE 0.9 % IV SOLN
1.0000 g | Freq: Three times a day (TID) | INTRAVENOUS | Status: AC
  Administered 2018-08-05 – 2018-08-12 (×21): 1 g via INTRAVENOUS
  Filled 2018-08-05 (×21): qty 1

## 2018-08-05 MED ORDER — VANCOMYCIN HCL IN DEXTROSE 1-5 GM/200ML-% IV SOLN
1000.0000 mg | Freq: Three times a day (TID) | INTRAVENOUS | Status: DC
  Administered 2018-08-05 – 2018-08-08 (×9): 1000 mg via INTRAVENOUS
  Filled 2018-08-05 (×10): qty 200

## 2018-08-05 NOTE — Progress Notes (Signed)
Pt sleeping at this time and spo2 86%. Pt placed on 2L [place] with improvement to 94%.

## 2018-08-05 NOTE — Progress Notes (Signed)
ANTICOAGULATION CONSULT NOTE -  Consult follow-up Note   Pharmacy Consult for Bivaliruidin > apixaban Indication: pulmonary embolus and bilateral DVT  Allergies  Allergen Reactions  . Nsaids Other (See Comments)    G6PD DEFICIENCY  . Sulfa Antibiotics Other (See Comments)    G6PD DEFICIENCY  . Protonix [Pantoprazole Sodium] Diarrhea    Severe diarrhea, Upset stomach    Patient Measurements: Height: 5\' 9"  (175.3 cm) Weight: 281 lb 4.9 oz (127.6 kg) IBW/kg (Calculated) : 70.7 Bivalirudin Dosing Weight: 127.6 kg  Vital Signs: Temp: 98.9 F (37.2 C) (11/23 0729) Temp Source: Oral (11/23 0729) BP: 111/70 (11/23 0729) Pulse Rate: 118 (11/23 0729)  Labs: Recent Labs    08/02/18 2204  08/03/18 0450 08/03/18 0846 08/04/18 0500 08/05/18 0544  HGB  --    < > 9.0*  --  9.1* 8.5*  HCT  --   --  28.3*  --  28.8* 28.3*  PLT  --   --  160  --  224 210  APTT 54*  --  54*  --  70*  --   CREATININE  --   --   --  0.66 0.60* 0.92   < > = values in this interval not displayed.    Estimated Creatinine Clearance: 124.2 mL/min (by C-G formula based on SCr of 0.92 mg/dL).  Assessment: 52 yr old male with new PE per 11/13 CTA and bilateral DVT per duplex today, and thrombocytpenia. Has IVC filter.  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  Last MRI (11/10) did not show evidence of compressive hematoma.  Hx PE and DVT and was on Eliquis prior to admit. Dr. Myna HidalgoEnnever and Dr. Wynetta Emeryram discussed risk/benefit and to begin Bivalirudin with low therapeutic goal.  Previously on Argatroban 10/22>10/23 then Bivalirudin 10/23>10/25. Noted switched due to Argatroban possibly contributing to bradycardia and hypotension. Bival no d/ced and Pharmacy has been for Apixaban dosing.   CBCs stable, no s/s of bleeding noted  Goal of Therapy:  Monitor platelets by anticoagulation protocol: Yes   Plan:  Continue apixaban 5mg  PO BID (Oncology requests no loading  dose) Pharmacy will continue to monitor peripherally   Gwynneth AlbrightSara , Ilda BassetPharm D PGY1 Pharmacy Resident  Phone [phone] 08/05/2018   10:26 AM

## 2018-08-05 NOTE — Progress Notes (Signed)
Patient ID: [name] [name], male   DOB: [birth year], 52 y.o.   MRN: [id number] Clinically stable.  No complaints today.  Continue current management

## 2018-08-05 NOTE — Progress Notes (Signed)
CRITICAL VALUE ALERT  Critical Value: Lactic Acid 5.0  Date & Time Notied:  08/05/18  Provider Notified: Dr. Blake DivineAkula  Orders Received/Actions taken: Dr. Blake DivineAkula returned my phone call and we are going to stop the lasix and monitor patient, blood pressure was 96/63 (74). Fluids may be restarted later on in the day.

## 2018-08-05 NOTE — Progress Notes (Signed)
CRITICAL VALUE ALERT  Critical Value: Lactic Acid = 5.0  Date & Time Notied:  08/05/18 @ 12:25  Provider Notified:  Dr. Blake DivineAkula 08/05/18 @ 12:27  Orders Received/Actions taken: waiting for orders

## 2018-08-05 NOTE — Progress Notes (Signed)
Nurse went to change patient PICC dressing and drawn lactic acid and noticed the purple port was clogged up. Purple port was fine earlier when I drew lactic acid the first time.   I put in an order for IV team and since IV team was already on the unit for my patient in room 10 I asked if she could come and assess for herself.  She also could not flush the line.  Then when she went to flush the red port it also could no longer be flushed (2 minutes after labs were drawn).   Nurse retrieved  Alteplase for IV Nurse in order to allow clot to dissolve. Alteplase will stay in place for 2 hours.    Nurse will pass this information on in report.

## 2018-08-05 NOTE — Progress Notes (Signed)
PROGRESS NOTE    [name] [name]  SJG:[id number] DOB: [birth year] DOA: 06/06/2018 PCP: Helane Rima, MD    Brief Narrative: 52 year old man with past medical history relevant for gout, hypertension anterior cervical corpectomy of C4 on 03/24/2018 with comp gated postoperative course including development of multiple PEs and DVTs started on anticoagulation and subsequently developed hematoma requiring reexploration on 03/28/2018 status post placement of IVC filter who was discharged to skilled nursing facility and had a complicated course including progressive lower extremity weakness, intermittent fevers of unclear etiology and hospitalizations for "pneumonia". Patient was admitted to this hospital is a transfer from [facility] with MRI of C-spine that showed postoperative changes and cord compression with cord edema and concern for ischemia. Patient is status post posterior cervical decompression laminectomy of C3, C4, C5, removal of spinous processes, foraminotomies on 06/14/2018. On 06/22/2018 patient began to develop worsening left sided paresthesias and weakness and a stat MRI showed evidence of postoperative hematoma. Patient was taken to the OR with evacuation of hematoma. Additionally his postoperative course has been complicated by recurrent fevers,bilateral lower extremity DVTs on ultrasound,migratory arthralgias/arthritis that are thought to be rheumatologic in nature as well as significant orthostasis limiting the amount he can work with physical therapy. His arthralgia has resolved.   Noted to have decreased left hand grip on 10/19, MRI showed large epidural hematoma, patient s/p clot evacuation.  He was place back on anticoagulation argatroban on 10-22. He was notice to be more bradycardic with HR in the 40 on 10-23. Cardiology was consulted, and argatroban was transition to bivalirrubin to avoid bradycardia. His HR improved after argatroban was discontinue. -HIT panel0.486  (mildly elevated)- SRA (serotonin release assay) negative. Dr Marin Olp has been helping with patient care.   Patient developed weakness of left arm on 10-25. He was take emergently to OR for recurrence of epidural hematoma on 10-25.  He was taken off of anticoagulation.  Patient the night of 10-28 develops left arm weakness, numbness. Stat MRI was ordered, which confirm recurrent hematoma. Patient was taken to OR for hematoma evacuation 10-28. For his thrombocytopenia, a Bone marrow biopsy was done. For LE DVT, he has IVC filter. Patient has received multiple platelet transfusions and Nplate.  Patient underwent CT scan of his chest on 11/13 which showed new pulmonary embolism.  Patient was started on bivalirudin after discussions between hematology as well as neurosurgery.  Platelet counts appear to be improving.   Pt seen and examined, no new complaints. Today.   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  Status post cervical decompressive surgery on 06/14/2018 Status post evacuation of large hematoma and placement of Hemovac drain on 06/22/2018. Patient with decreased left sided handgrip and weakness since 10/19. MRI of the C-spine shows large epidural hemo-following which he had several clot evacuation procedures done on 10/19, 10/35, 10/28.  Repeat MRI on 07/23/2018 shows improvement in the fluid collection.  Neurosurgery continues to follow and they  plan to taper steroids.  Discussed with Dr. Saintclair Halsted who plans to refer the patient to spine rehab center in [place]. Acute pulmonary embolism/DVT in the setting of spine surgery Status post IVC filter placement on March 28, 2018 patient has been on multiple anticoagulants which has led to recurrent episodes of hematomas at the surgical sites requiring further evacuations.  Case was discussed with vascular surgery Dr. Lonni Fix, interventional radiologist they recommended that mechanical thrombectomy and thrombolysis is not on the cards.  He was kept off anticoagulation for a brief.  Due to recurrent bleeding.  However a CAT scan on the November 13 showed new pulmonary embolism with evidence of right heart.  He was subsequently started on bivalrudin.  So far he has no signs of bleeding.  And platelet counts are improving.  His platelet count  within normal limits and his angiomax was changed to Eliquis.     Thrombocytopenia/coagulopathy Patient had extensive work-up for acute thrombocytopenia.   cardiolipin antibody lupus anticoagulant, homocysteine levels factor VIII levels all within normal limits.  Protein S activity was low, protein C activity was greater than 200.  HIT panel was mildly elevated but serotonin release assay was negative.  Over the course of his hospitalization patient had multiple platelet transfusions and his platelet count has slowly increased with endplate infusions on weekly basis.   Hyperglycemia secondary to steroids Currently on Lantus and sliding scale insulin. Hemoglobin A1c was 5.8 when last checked. NovoLog 3 units 3 times daily AC was added CBGs greater than 250 to be given when he was getting Ensure supplements. CBG (last 3)  Recent Labs    08/04/18 2130 08/05/18 0734 08/05/18 1113  GLUCAP 134* 211* 197*       Anemia of chronic disease he did get 1 unit of PRBC transfusion during this hospitalization. Hemoglobin around 8.5, transfuse to keep hemoglobin  greater than 7.   Orthostatic hypotension with near syncope Thought to be secondary to hypovolemia not autonomic dysfunction. Echocardiogram shows grade 2 diastolic dysfunction. Midodrine was added and his blood pressure parameters have been within normal limits.    Sinus bradycardia Is currently asymptomatic, TSH was within normal limits.  Plan to avoid AV nodal blocking agents.    Polyarthralgias Resolved   Fluid overload with upper and lower extremity edema patient has diuresed used up to 16 liters since admission with IV Lasix. Increased the lasix to q12 hours. Good diuresis with IV lasix overnight.   Adjustment disorder with depression and anxiety Resume Klonopin as needed.    Essential hypertension well controlled    Acute renal failure Resolved    Stage II pressure ulcer on the right ischial tuberosity not present on admission  wound care consulted and recommendations given.    Oropharyngeal candidiasis He was given a course of fluconazole.   Acute diarrhea Resolved   Large free intra peritoneal air on abd x ray:  Pt currently asymptomatic. No nausea, vomiting or abdominal pain.  CT abd and pelvis does not show any viscous perforation.  Discussed the results of the abnormal abd x ray with the patient.  Surgery consulted and recommended no further work up at this time.they have signed off. Started the patient on clear liquid diet and advance as tolerated.     Fever, hypotension, tachycardia, hypoxic and elevated lactic acid Suspect evolving sepsis Get 2 sets of blood cultures and repeat abdominal film. Change IV Levaquin to IV vancomycin and IV cefepime for possible healthcare associated pneumonia. Trend lactic acid and  stop IV Lasix for now.  Keep map greater than 65.  Check CBC in the morning.  DVT prophylaxis: ted stockings.  Code Status:  Full code Family Communication: none at bedside.  Disposition Plan: pending clinical improvement.     Consultants:   NS Dr Saintclair Halsted  Hematology  Dr Marin Olp.   Cardiology.    Procedures:  On 10/2 1. posterior cervical decompressive laminectomy at C3, C4, C5 with removal of the spinous process and complete laminectomy and foraminotomies of the C3, C4, C5 nerve roots. 2. Posterior cervical fusion with lateral mass screws at C3-C4-C5 and pars screws at C2 lies in the globus ellipseLateral mass screw system 3. Posterior lateral arthrodesis C2-C5 utilizing locally harvested autograft mixed with vivigen  06/22/2018: Procedure reexploration posterior cervical wound for evacuation of epidural hematoma  06/26/2018 DVT ultrasound:Right: Findings consistent with acute deep vein thrombosis involving the right common femoral vein, right femoral vein, right popliteal vein, right posterior tibial vein, and right peroneal vein. Thrombosis extends proximally into external iliac vein.  Proximal iliac vein not visualized. Left: Findings consistent with acute deep vein thrombosis involving the left common femoral vein, left femoral vein, left popliteal vein, left posterior tibial vein, and left peroneal vein. Findings consistent with acute superficial vein thrombosis  involving the left great saphenous vein. Thrombosis extends proximally into external iliac vein. Proximal iliac vein not visualized.  07/01/18 Clot evacuation   RUE doppler 07/02/18 Antimicrobials: completed ancef.   Subjective: Patient denies any chest pain shortness of breath nausea vomiting or abdominal pain or diarrhea.  Objective: Vitals:   08/05/18 0750 08/05/18 1111 08/05/18 1416 08/05/18 1524  BP:  95/63  (!) 90/56  Pulse:  (!) 116  (!) 120  Resp:    19  Temp:  98.6 F (37 C)  98.7 F (37.1 C)  TempSrc:  Oral  Oral  SpO2: 97%  97% 90%  Weight:      Height:        Intake/Output Summary (Last 24 hours) at 08/05/2018 1656 Last data filed at 08/05/2018 1153 Gross per 24 hour  Intake 2387.47 ml  Output 1275 ml   Net 1112.47 ml   Filed Weights   07/07/18 1000 07/18/18 0600 07/27/18 1502  Weight: 123.8 kg 135 kg 127.6 kg    Examination:  General exam: Calm and comfortable not in any kind of distress, has neck collar. Respiratory system: Diminished at bases, no wheezing or rhonchi Cardiovascular system: S1 & S2 heard, tachycardic, regular rhythm no JVD,  Gastrointestinal system: Abdomen is  Soft nontender, nondistended with goodBowel sounds Central nervous system: Alert and oriented. Left upper and lower extremity weakness improving.  Extremities: Improving pedal edema Skin: right tibial ischial tuberosity stage 2 pressure ulcer.  Psychiatry:  Mood & affect appropriate.     Data Reviewed: I have personally reviewed following labs and imaging studies  CBC: Recent Labs  Lab 08/01/18 0440 08/02/18 0429 08/03/18 0450 08/04/18 0500 08/05/18 0544  WBC 5.5 6.1 6.3 8.2 8.9  HGB 9.4* 9.1* 9.0* 9.1* 8.5*  HCT 30.4* 30.2* 28.3* 28.8* 28.3*  MCV 96.8 99.0 97.6 95.0 101.8*  PLT 112* 133* 160 224 470   Basic Metabolic Panel: Recent Labs  Lab 07/31/18 0440 08/02/18 0429 08/03/18 0846 08/04/18 0500 08/05/18 0544  NA 142 140 140 141 136  K 3.9 4.0 3.8 4.0 3.9  CL 105 104 104 106 98  CO2 '28 25 28 27 26  '$ GLUCOSE 227* 214* 127* 71 247*  BUN 37* 34* 31*  28* 26*  CREATININE 0.64 0.72 0.66 0.60* 0.92  CALCIUM 9.3 9.0 8.9 8.7* 8.3*   GFR: Estimated Creatinine Clearance: 124.2 mL/min (by C-G formula based on SCr of 0.92 mg/dL). Liver Function Tests: Recent Labs  Lab 08/03/18 0846 08/04/18 0500 08/05/18 0544  AST 34 38 47*  ALT 27 27 33  ALKPHOS 60 54 77  BILITOT 0.8 0.7 0.6  PROT 5.5* 5.5* 5.5*  ALBUMIN 2.8* 2.7* 2.7*   No results for input(s): LIPASE, AMYLASE in the last 168 hours. No results for input(s): AMMONIA in the last 168 hours. Coagulation Profile: No results for input(s): INR, PROTIME in the last 168 hours. Cardiac Enzymes: No results for input(s): CKTOTAL, CKMB,  CKMBINDEX, TROPONINI in the last 168 hours. BNP (last 3 results) No results for input(s): PROBNP in the last 8760 hours. HbA1C: No results for input(s): HGBA1C in the last 72 hours. CBG: Recent Labs  Lab 08/04/18 1112 08/04/18 1742 08/04/18 2130 08/05/18 0734 08/05/18 1113  GLUCAP 153* 183* 134* 211* 197*   Lipid Profile: No results for input(s): CHOL, HDL, LDLCALC, TRIG, CHOLHDL, LDLDIRECT in the last 72 hours. Thyroid Function Tests: No results for input(s): TSH, T4TOTAL, FREET4, T3FREE, THYROIDAB in the last 72 hours. Anemia Panel: No results for input(s): VITAMINB12, FOLATE, FERRITIN, TIBC, IRON, RETICCTPCT in the last 72 hours. Sepsis Labs: Recent Labs  Lab 08/05/18 1141  LATICACIDVEN 5.0*    Recent Results (from the past 240 hour(s))  Culture, blood (Routine X 2) w Reflex to ID Panel     Status: None (Preliminary result)   Collection Time: 08/05/18  9:40 AM  Result Value Ref Range Status   Specimen Description BLOOD RIGHT ANTECUBITAL  Final   Special Requests   Final    BOTTLES DRAWN AEROBIC ONLY Blood Culture adequate volume   Culture   Final    NO GROWTH < 12 HOURS Performed at [facility], [place] [address], [place], [place] [address]    Report Status PENDING  Incomplete  Culture, blood (Routine X 2) w Reflex to ID Panel     Status: None (Preliminary result)   Collection Time: 08/05/18  9:47 AM  Result Value Ref Range Status   Specimen Description BLOOD RIGHT ANTECUBITAL  Final   Special Requests   Final    BOTTLES DRAWN AEROBIC ONLY Blood Culture adequate volume   Culture   Final    NO GROWTH < 12 HOURS Performed at [facility], [place] [address] [place], [place] [address]    Report Status PENDING  Incomplete         Radiology Studies: Ct Abdomen Pelvis W Contrast  Result Date: 08/04/2018 CLINICAL DATA:  Free air seen on x-ray. EXAM: CT ABDOMEN AND PELVIS WITH CONTRAST TECHNIQUE: Multidetector CT imaging of the abdomen and pelvis was  performed using the standard protocol following bolus administration of intravenous contrast. CONTRAST:  131m OMNIPAQUE IOHEXOL 300 MG/ML  SOLN COMPARISON:  KUB August 03, 2018.  CT scan July 28, 2018. FINDINGS: Lower chest: Left greater than right pulmonary opacities, unchanged. No other acute abnormalities in the lower chest. Hepatobiliary: The liver and portal vein are normal. The gallbladder is unremarkable. Pancreas: Unremarkable. No pancreatic ductal dilatation or surrounding inflammatory changes. Spleen: Normal in size without focal abnormality. Adrenals/Urinary Tract: The adrenal glands, kidneys, ureters, and bladder are unremarkable. Stomach/Bowel: The stomach and small bowel are normal. Moderate fecal loading in the colon. The colon and appendix are otherwise normal. Vascular/Lymphatic: IVC filter. The abdominal  aorta is nonaneurysmal. No dissection or adenopathy. Reproductive: Prostate is unremarkable. Other: There is a large amount of free air in the abdomen. This was also seen on the August 03, 2018 KUB. The finding of free air was called to the referring clinical team after the KUB. Musculoskeletal: No acute or significant osseous findings. IMPRESSION: 1. No cause for the patient's known free intraperitoneal air is identified. The quantity of free air is large. The clinical team is already aware of the free air as discussed on the KUB from earlier today. 2. Bibasilar opacities, left greater than right. The right basilar opacity is almost certainly atelectasis. The left basilar opacity could represent atelectasis or infiltrate such as pneumonia. 3. Moderate fecal loading in the colon. 4. IVC filter. 5. No other abnormalities. Electronically Signed   By: Dorise Bullion III M.D   On: 08/04/2018 00:41        Scheduled Meds: . apixaban  5 mg Oral BID  . Chlorhexidine Gluconate Cloth  6 each Topical Q0600  . clonazePAM  0.5 mg Oral BID  . feeding supplement (ENSURE ENLIVE)  237 mL Oral  BID BM  . gabapentin  300 mg Oral TID  . Gerhardt's butt cream   Topical BID  . Influenza vac split quadrivalent PF  0.5 mL Intramuscular Tomorrow-1000  . insulin aspart  0-15 Units Subcutaneous TID WC  . insulin aspart  0-5 Units Subcutaneous QHS  . insulin glargine  35 Units Subcutaneous QHS  . ipratropium-albuterol  3 mL Nebulization TID  . lipase/protease/amylase  36,000 Units Oral TID AC  . magic mouthwash w/lidocaine  5 mL Oral TID  . methocarbamol  750 mg Oral QID  . midodrine  5 mg Oral BID WC  . multivitamin with minerals  1 tablet Oral Daily  . saccharomyces boulardii  250 mg Oral BID  . sodium chloride flush  10-40 mL Intracatheter Q12H   Continuous Infusions: . sodium chloride 10 mL/hr at 07/25/18 2300  . ceFEPime (MAXIPIME) IV 1 g (08/05/18 1600)  . famotidine (PEPCID) IV 40 mg (08/05/18 0924)  . vancomycin 1,000 mg (08/05/18 1441)     LOS: 59 days    Time spent: 25 minutes.     Hosie Poisson, MD [facility] Pager [phone]  If 7PM-7AM, please contact night-coverage [email or web address] Password [facility] 08/05/2018, 4:56 PM

## 2018-08-05 NOTE — Progress Notes (Signed)
Patient off the unit to CT

## 2018-08-05 NOTE — Progress Notes (Signed)
Pharmacy Antibiotic Note  [name] [name] is a 52 y.o. male admitted on 06/06/2018 with possible sepsis potentially secondary to pneumonia.  Pharmacy has been consulted for vancomycin and cefepime dosing.  WBC wnl, febrile on 11/22, afebrile now, Scr stable   Plan: Vancomycin 1000 IV every 8 hours.  Goal trough 15-20 mcg/mL.  Cefepime 1 gram every 8 hours  Height: 5\' 9"  (175.3 cm) Weight: 281 lb 4.9 oz (127.6 kg) IBW/kg (Calculated) : 70.7  Temp (24hrs), Avg:100.1 F (37.8 C), Min:98.3 F (36.8 C), Max:102.3 F (39.1 C)  Recent Labs  Lab 07/31/18 0440 08/01/18 0440 08/02/18 0429 08/03/18 0450 08/03/18 0846 08/04/18 0500 08/05/18 0544 08/05/18 1141  WBC 5.2 5.5 6.1 6.3  --  8.2 8.9  --   CREATININE 0.64  --  0.72  --  0.66 0.60* 0.92  --   LATICACIDVEN  --   --   --   --   --   --   --  5.0*    Estimated Creatinine Clearance: 124.2 mL/min (by C-G formula based on SCr of 0.92 mg/dL).    Allergies  Allergen Reactions  . Nsaids Other (See Comments)    G6PD DEFICIENCY  . Sulfa Antibiotics Other (See Comments)    G6PD DEFICIENCY  . Protonix [Pantoprazole Sodium] Diarrhea    Severe diarrhea, Upset stomach    Antimicrobials this admission: Vancomycin>>  Cefepime>>  Levofloxacin 11/22>11/23  Microbiology results: 11/23 BCx: sent 11/08 MRSA PCR: negative  Thank you for allowing pharmacy to be a part of this patient's care.  Gwynneth AlbrightSara Nimer, Ilda BassetPharm D PGY1 Pharmacy Resident  Phone [phone] 08/05/2018   2:05 PM

## 2018-08-05 NOTE — Progress Notes (Signed)
IV Team came back and TPA worked, both ports are flushing.

## 2018-08-06 ENCOUNTER — Inpatient Hospital Stay (HOSPITAL_COMMUNITY)

## 2018-08-06 ENCOUNTER — Encounter (HOSPITAL_COMMUNITY): Payer: Self-pay | Admitting: Radiology

## 2018-08-06 LAB — COMPREHENSIVE METABOLIC PANEL
ALBUMIN: 2.3 g/dL — AB (ref 3.5–5.0)
ALK PHOS: 68 U/L (ref 38–126)
ALT: 27 U/L (ref 0–44)
AST: 39 U/L (ref 15–41)
Anion gap: 8 (ref 5–15)
BUN: 22 mg/dL — ABNORMAL HIGH (ref 6–20)
CALCIUM: 8.2 mg/dL — AB (ref 8.9–10.3)
CO2: 30 mmol/L (ref 22–32)
CREATININE: 0.8 mg/dL (ref 0.61–1.24)
Chloride: 98 mmol/L (ref 98–111)
GFR calc Af Amer: >60 mL/min (ref >60)
GFR calc non Af Amer: >60 mL/min (ref >60)
Glucose, Bld: 152 mg/dL — ABNORMAL HIGH (ref 70–99)
Potassium: 3.4 mmol/L — ABNORMAL LOW (ref 3.5–5.1)
SODIUM: 136 mmol/L (ref 135–145)
Total Bilirubin: 0.6 mg/dL (ref 0.3–1.2)
Total Protein: 5.1 g/dL — ABNORMAL LOW (ref 6.5–8.1)

## 2018-08-06 LAB — GLUCOSE, CAPILLARY
GLUCOSE-CAPILLARY: 60 mg/dL — AB (ref 70–99)
GLUCOSE-CAPILLARY: 95 mg/dL (ref 70–99)
GLUCOSE-CAPILLARY: 93 mg/dL (ref 70–99)
Glucose-Capillary: 136 mg/dL — ABNORMAL HIGH (ref 70–99)
Glucose-Capillary: 126 mg/dL — ABNORMAL HIGH (ref 70–99)
Glucose-Capillary: 80 mg/dL (ref 70–99)

## 2018-08-06 LAB — CBC
HCT: 24.6 % — ABNORMAL LOW (ref 39.0–52.0)
Hemoglobin: 7.5 g/dL — ABNORMAL LOW (ref 13.0–17.0)
MCH: 30.2 pg (ref 26.0–34.0)
MCHC: 30.5 g/dL (ref 30.0–36.0)
MCV: 99.2 fL (ref 80.0–100.0)
PLATELETS: 190 10*3/uL (ref 150–400)
RBC: 2.48 MIL/uL — AB (ref 4.22–5.81)
RDW: 18.6 % — AB (ref 11.5–15.5)
WBC: 8 10*3/uL (ref 4.0–10.5)
nRBC: 3.9 % — ABNORMAL HIGH (ref 0.0–0.2)

## 2018-08-06 LAB — PREPARE RBC (CROSSMATCH)

## 2018-08-06 LAB — LACTIC ACID, PLASMA: LACTIC ACID, VENOUS: 2.3 mmol/L — AB (ref 0.5–1.9)

## 2018-08-06 LAB — LACTATE DEHYDROGENASE: LDH: 477 U/L — ABNORMAL HIGH (ref 98–192)

## 2018-08-06 MED ORDER — IOHEXOL 300 MG/ML  SOLN
125.0000 mL | Freq: Once | INTRAMUSCULAR | Status: AC | PRN
  Administered 2018-08-06: 125 mL via INTRAVENOUS

## 2018-08-06 MED ORDER — SODIUM CHLORIDE 0.9% IV SOLUTION: Freq: Once | INTRAVENOUS | Status: DC

## 2018-08-06 MED ORDER — DEXTROSE 50 % IV SOLN
INTRAVENOUS | Status: AC
  Administered 2018-08-06 – 2018-08-07 (×2): 25 mL
  Filled 2018-08-06: qty 50

## 2018-08-06 NOTE — Progress Notes (Signed)
Overall situation stable.  Patient obviously frustrated by his prolonged hospitalization.  Neurologic exam unchanged.  Continue current management.  No new recommendations.

## 2018-08-06 NOTE — Progress Notes (Signed)
2000- Spoke with Dr. Corky SoxArthur [name] r/t CT revealing free air in patient's abdomen. Dr. wanted another CT done with contrast to make sure excess air is not from bowel perforation; order placed.  2030- Patient made NPO  0130- Patient drank first bottle of contrast  0230- Patient drank second bottle of contrast  0430- Patient taken down to CT.

## 2018-08-06 NOTE — Progress Notes (Signed)
PROGRESS NOTE    [name] [name]  ZDG:[id number] DOB: 1965/12/29 DOA: 06/06/2018 PCP: Helane Rima, MD    Brief Narrative: 52 year old man with past medical history relevant for gout, hypertension anterior cervical corpectomy of C4 on 03/24/2018 with comp gated postoperative course including development of multiple PEs and DVTs started on anticoagulation and subsequently developed hematoma requiring reexploration on 03/28/2018 status post placement of IVC filter who was discharged to skilled nursing facility and had a complicated course including progressive lower extremity weakness, intermittent fevers of unclear etiology and hospitalizations for "pneumonia". Patient was admitted to this hospital is a transfer from [facility] with MRI of C-spine that showed postoperative changes and cord compression with cord edema and concern for ischemia. Patient is status post posterior cervical decompression laminectomy of C3, C4, C5, removal of spinous processes, foraminotomies on 06/14/2018. On 06/22/2018 patient began to develop worsening left sided paresthesias and weakness and a stat MRI showed evidence of postoperative hematoma. Patient was taken to the OR with evacuation of hematoma. Additionally his postoperative course has been complicated by recurrent fevers,bilateral lower extremity DVTs on ultrasound,migratory arthralgias/arthritis that are thought to be rheumatologic in nature as well as significant orthostasis limiting the amount he can work with physical therapy. His arthralgia has resolved.   Noted to have decreased left hand grip on 10/19, MRI showed large epidural hematoma, patient s/p clot evacuation.  He was place back on anticoagulation argatroban on 10-22. He was notice to be more bradycardic with HR in the 40 on 10-23. Cardiology was consulted, and argatroban was transition to bivalirrubin to avoid bradycardia. His HR improved after argatroban was discontinue. -HIT panel0.486  (mildly elevated)- SRA (serotonin release assay) negative. Dr Marin Olp has been helping with patient care.   Patient developed weakness of left arm on 10-25. He was take emergently to OR for recurrence of epidural hematoma on 10-25.  He was taken off of anticoagulation.  Patient the night of 10-28 develops left arm weakness, numbness. Stat MRI was ordered, which confirm recurrent hematoma. Patient was taken to OR for hematoma evacuation 10-28. For his thrombocytopenia, a Bone marrow biopsy was done. For LE DVT, he has IVC filter. Patient has received multiple platelet transfusions and Nplate.  Patient underwent CT scan of his chest on 11/13 which showed new pulmonary embolism.  Patient was started on bivalirudin after discussions between hematology as well as neurosurgery.  Platelet counts have improved.  On Friday 11/22 pt was found to have  Free intraabdominal air, it was followed with CT abd pelvis , shows possible stranding at the greater curvature of the stomach.  Surgery consulted and recommendations given. Plan for upper GI series in am. Pt does not have any abdominal complaints at this time.   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  Status post cervical decompressive surgery on 06/14/2018 Status post evacuation of large hematoma and placement of Hemovac drain on 06/22/2018. Patient with decreased left sided handgrip  and weakness since 10/19. MRI  of the C-spine shows large epidural hemo-following which he had several clot evacuation procedures done on 10/19, 10/35, 10/28.  Repeat MRI on 07/23/2018 shows improvement in the fluid collection.  Neurosurgery continues to follow and they plan to taper steroids.  Discussed with Dr. Saintclair Halsted who plans to refer the patient to spine rehab center in [place]. Acute pulmonary embolism/DVT in the setting of spine surgery Status post IVC filter placement on March 28, 2018 patient has been on multiple anticoagulants which has led to recurrent episodes of hematomas at the surgical sites requiring further evacuations.  Case was discussed with vascular surgery Dr. Lonni Fix, interventional radiologist they recommended that mechanical thrombectomy and thrombolysis is not on the cards.  He was kept off anticoagulation for a brief.  Due to recurrent bleeding.  However a CAT scan on the November 13 showed new pulmonary embolism with evidence of right heart.  He was subsequently started on bivalrudin.  So far he has no signs of bleeding.  And platelet counts are improving.  His platelet count  within normal limits and his angiomax was changed to Eliquis on Friday,but it was held today for a drop in hemoglobin and for  possible abd surgery in the next 24 hours.     Thrombocytopenia/coagulopathy Patient had extensive work-up for acute thrombocytopenia.   cardiolipin antibody lupus anticoagulant, homocysteine levels factor VIII levels all within normal limits.  Protein S activity was low, protein C activity was greater than 200.  HIT panel was mildly elevated but serotonin release assay was negative.  Over the course of his hospitalization patient had multiple platelet transfusions and his platelet count has slowly increased with endplate infusions on weekly basis.   Hyperglycemia secondary to steroids Currently on Lantus and sliding scale insulin. Hemoglobin A1c was 5.8 when last checked.  CBG (last 3)  Recent Labs     08/06/18 0837 08/06/18 1109 08/06/18 1536  GLUCAP 136* 126* 80   Monitor for hypoglycemic episodes.     Anemia of chronic disease/ anemia of blood loss ? GI ulcer.  Get stool for occult blood.  Get anemia panel.  2nd unit of prbc transfusion ordered.  Repeat H&H in am.  So far 2 units of prbc transfusion ordered.     Orthostatic hypotension with near syncope Thought to be secondary to hypovolemia not autonomic dysfunction. Echocardiogram shows grade 2 diastolic dysfunction. Midodrine was added and his blood pressure parameters have been within normal limits.    Sinus bradycardia Is currently asymptomatic, TSH was within normal limits.  Plan to avoid AV nodal blocking agents.    Polyarthralgias Resolved   Fluid overload with upper and lower extremity edema  patient has diuresed well with IV lasix, which is being held for sepsis.   Adjustment disorder with depression and anxiety Resume Klonopin as needed.    Essential hypertension well controlled    Acute renal failure Resolved    Stage II pressure ulcer on the right ischial tuberosity not present on admission  wound care consulted and recommendations given.    Oropharyngeal candidiasis He was given a course of fluconazole.   Acute diarrhea Resolved   Fever, hypotension, tachycardia, hypoxic and elevated lactic acid Suspect evolving sepsis from viscous perforation.  Blood cultures done and pending so far, follow them.  Repeat abd film shows persistent and large intraperitoneal free air. It was followed with a CT abd and pelvis showing some stranding of the soft tissue at the greater curvature of  the stomach, suspicious for gastric ulcer perforation.  Surgery re consulted and plan for upper GI series in am.  Change IV Levaquin to IV vancomycin and IV cefepime  Trend lactic acid and stop IV Lasix for now.  Keep map greater than 65.   Continue with strict NPO for now.  Will hold the eliquis for now.     DVT prophylaxis: ted stockings.  Code Status:  Full code Family Communication: family at bedside.  Disposition Plan: pending clinical improvement.    Consultants:   NS Dr Saintclair Halsted  Hematology  Dr Marin Olp.   Cardiology.   Surgery Dr Donne Hazel.    Procedures:  On 10/2 1. posterior cervical decompressive laminectomy at C3, C4, C5 with removal of the spinous process and complete laminectomy and foraminotomies of the C3, C4, C5 nerve roots. 2. Posterior cervical fusion with lateral mass screws at C3-C4-C5 and pars screws at C2 lies in the globus ellipseLateral mass screw system 3. Posterior lateral arthrodesis C2-C5 utilizing locally harvested autograft mixed with vivigen  06/22/2018: Procedure reexploration posterior cervical wound for evacuation of epidural hematoma  06/26/2018 DVT ultrasound:Right: Findings consistent with acute deep vein thrombosis involving the right common femoral vein, right femoral vein, right popliteal vein, right posterior tibial vein, and right peroneal vein. Thrombosis extends proximally into external iliac vein.  Proximal iliac vein not visualized. Left: Findings consistent with acute deep vein thrombosis involving the left common femoral vein, left femoral vein, left popliteal vein, left posterior tibial vein, and left peroneal vein. Findings consistent with acute superficial vein thrombosis  involving the left great saphenous vein. Thrombosis extends proximally into external iliac vein. Proximal iliac vein not visualized.  07/01/18 Clot evacuation   RUE doppler 07/02/18 Antimicrobials: vancomycin and zosyn since 11/23 for sepsis and abdominal infection.   Subjective: HE denies any abd pain , nausea, or vomiting.   Objective: Vitals:   08/06/18 0500 08/06/18 0835 08/06/18 1110 08/06/18 1531  BP: 103/73 108/74 107/71 124/78  Pulse: (!) 101 99 98 92  Resp: 16  (!) 24 (!) 26  Temp: 98.1 F (36.7 C) 99 F (37.2 C) 98.6 F (37 C) 98.7 F  (37.1 C)  TempSrc: Oral Oral Oral Oral  SpO2: 100% 100% 100% 100%  Weight:      Height:        Intake/Output Summary (Last 24 hours) at 08/06/2018 1614 Last data filed at 08/06/2018 1343 Gross per 24 hour  Intake 923.89 ml  Output 1600 ml  Net -676.11 ml   Filed Weights   07/07/18 1000 07/18/18 0600 07/27/18 1502  Weight: 123.8 kg 135 kg 127.6 kg    Examination:  General exam: calm and not in distress. Has neck collar.  Respiratory system: Diminished at bases, no wheezing or rhonchi Cardiovascular system: S1 & S2 heard, tachycardic, regular rhythm no JVD,  Gastrointestinal system: Abdomen is soft NT ND BS+ Central nervous system: Alert and oriented. Left upper and lower extremity weakness improving.  Extremities: Improving pedal edema Skin: right tibial ischial tuberosity stage 2 pressure ulcer.  Psychiatry: appears slightly frustrated.     Data Reviewed: I have personally reviewed following labs and imaging studies  CBC: Recent Labs  Lab 08/02/18 0429 08/03/18 0450 08/04/18 0500 08/05/18 0544 08/06/18 0615  WBC 6.1 6.3 8.2 8.9 8.0  HGB 9.1* 9.0* 9.1* 8.5* 7.5*  HCT 30.2* 28.3* 28.8* 28.3* 24.6*  MCV 99.0 97.6 95.0 101.8* 99.2  PLT 133* 160 224 210 573   Basic Metabolic Panel: Recent Labs  Lab 08/02/18 0429 08/03/18 0846 08/04/18 0500 08/05/18 0544 08/06/18 0615  NA 140 140 141 136 136  K 4.0 3.8 4.0 3.9 3.4*  CL 104 104 106 98 98  CO2 '25 28 27 26 30  ' GLUCOSE 214* 127* 71 247* 152*  BUN 34* 31* 28* 26* 22*  CREATININE 0.72 0.66 0.60* 0.92 0.80  CALCIUM 9.0 8.9 8.7* 8.3* 8.2*   GFR: Estimated Creatinine Clearance: 142.8 mL/min (by C-G formula based on SCr of 0.8 mg/dL). Liver Function Tests: Recent Labs  Lab 08/03/18 0846 08/04/18 0500 08/05/18 0544 08/06/18 0615  AST 34 38 47* 39  ALT 27 27 33 27  ALKPHOS 60 54 77 68  BILITOT 0.8 0.7 0.6 0.6  PROT 5.5* 5.5* 5.5* 5.1*  ALBUMIN 2.8* 2.7* 2.7* 2.3*   No results for input(s): LIPASE,  AMYLASE in the last 168 hours. No results for input(s): AMMONIA in the last 168 hours. Coagulation Profile: No results for input(s): INR, PROTIME in the last 168 hours. Cardiac Enzymes: No results for input(s): CKTOTAL, CKMB, CKMBINDEX, TROPONINI in the last 168 hours. BNP (last 3 results) No results for input(s): PROBNP in the last 8760 hours. HbA1C: No results for input(s): HGBA1C in the last 72 hours. CBG: Recent Labs  Lab 08/05/18 1707 08/05/18 2131 08/06/18 0837 08/06/18 1109 08/06/18 1536  GLUCAP 180* 196* 136* 126* 80   Lipid Profile: No results for input(s): CHOL, HDL, LDLCALC, TRIG, CHOLHDL, LDLDIRECT in the last 72 hours. Thyroid Function Tests: No results for input(s): TSH, T4TOTAL, FREET4, T3FREE, THYROIDAB in the last 72 hours. Anemia Panel: No results for input(s): VITAMINB12, FOLATE, FERRITIN, TIBC, IRON, RETICCTPCT in the last 72 hours. Sepsis Labs: Recent Labs  Lab 08/05/18 1141 08/05/18 1812  LATICACIDVEN 5.0* 3.0*    Recent Results (from the past 240 hour(s))  Culture, blood (Routine X 2) w Reflex to ID Panel     Status: None (Preliminary result)   Collection Time: 08/05/18  9:40 AM  Result Value Ref Range Status   Specimen Description BLOOD RIGHT ANTECUBITAL  Final   Special Requests   Final    BOTTLES DRAWN AEROBIC ONLY Blood Culture adequate volume   Culture   Final    NO GROWTH 1 DAY Performed at [facility], [place] [address] [place], [place] [address]    Report Status PENDING  Incomplete  Culture, blood (Routine X 2) w Reflex to ID Panel     Status: None (Preliminary result)   Collection Time: 08/05/18  9:47 AM  Result Value Ref Range Status   Specimen Description BLOOD RIGHT ANTECUBITAL  Final   Special Requests   Final    BOTTLES DRAWN AEROBIC ONLY Blood Culture adequate volume   Culture   Final    NO GROWTH 1 DAY Performed at [facility], [place] [address], [place], [place] [address]    Report Status PENDING  Incomplete           Radiology Studies: Ct Abdomen W Contrast  Result Date: 08/06/2018 CLINICAL DATA:  52 year old male with history of free air in the abdomen. Follow-up study. EXAM: CT ABDOMEN WITH CONTRAST TECHNIQUE: Multidetector CT imaging of the abdomen was performed using the standard protocol following bolus administration of intravenous contrast. CONTRAST:  156m OMNIPAQUE IOHEXOL 300 MG/ML  SOLN COMPARISON:  CT the abdomen and pelvis 08/03/2018. FINDINGS: Lower chest: Extensive linear scarring and/or atelectasis in the lower lobes of the lungs bilaterally. Hepatobiliary: No suspicious cystic or solid hepatic lesions. No  intra or extrahepatic biliary ductal dilatation. Gallbladder is normal in appearance. Pancreas: No pancreatic mass. No pancreatic ductal dilatation. No pancreatic or peripancreatic fluid or inflammatory changes. Spleen: Unremarkable. Adrenals/Urinary Tract: Bilateral kidneys and adrenal glands are normal in appearance. No hydroureteronephrosis in the visualized portions of the abdomen. Stomach/Bowel: There is some soft tissue stranding adjacent to the lateral aspect of the greater curvature of the stomach in the region of the body of the stomach best appreciated on axial image 21 of series 3 and coronal image 56 of series 6), concerning for a perforated gastric ulcer. The remainder of the stomach is otherwise unremarkable in appearance. No pathologic dilatation of visualized small bowel or colon. Vascular/Lymphatic: Aortic atherosclerosis. No lymphadenopathy noted in the abdomen. Other: IVC filter in position with tip terminating below the level of the renal veins. Large volume of pneumatosis again noted. No significant volume of ascites. Musculoskeletal: No aggressive appearing osseous lesions are noted in the visualized portions of the skeleton. IMPRESSION: 1. Persistent pneumoperitoneum. Soft tissue stranding adjacent to the greater curvature of the body of the stomach concerning for  potential perforated gastric ulcer. No other definite source of viscus perforation identified on today's examination. 2. Aortic atherosclerosis. 3. Persistent atelectasis and/or scarring in the lower lobes of the lungs bilaterally. Electronically Signed   By: Vinnie Langton M.D.   On: 08/06/2018 14:26   Dg Abd 2 Views  Result Date: 08/05/2018 CLINICAL DATA:  Free air in abdomen. EXAM: ABDOMEN - 2 VIEW COMPARISON:  Plain film of the abdomen dated 08/03/2018. FINDINGS: Persistent free intraperitoneal air, best seen on the decubitus image. Visualized bowel gas pattern is nonobstructive. IMPRESSION: Persistent free intraperitoneal air. This finding indicates viscus perforation until proven otherwise. Based on the location of the free intraperitoneal air, I suspect perforation at the level of the gastric fundus or splenic flexure. Recommend repeat abdomen CT with IV and oral contrast for further characterization. These results were called by telephone at the time of interpretation on 08/05/2018 at 8:02 pm to nurse Peach who verbally acknowledged these results. Electronically Signed   By: Franki Cabot M.D.   On: 08/05/2018 20:03        Scheduled Meds: . sodium chloride   Intravenous Once  . Chlorhexidine Gluconate Cloth  6 each Topical Q0600  . clonazePAM  0.5 mg Oral BID  . feeding supplement (ENSURE ENLIVE)  237 mL Oral BID BM  . gabapentin  300 mg Oral TID  . Gerhardt's butt cream   Topical BID  . Influenza vac split quadrivalent PF  0.5 mL Intramuscular Tomorrow-1000  . insulin aspart  0-15 Units Subcutaneous TID WC  . insulin aspart  0-5 Units Subcutaneous QHS  . insulin glargine  35 Units Subcutaneous QHS  . lipase/protease/amylase  36,000 Units Oral TID AC  . magic mouthwash w/lidocaine  5 mL Oral TID  . methocarbamol  750 mg Oral QID  . midodrine  5 mg Oral BID WC  . multivitamin with minerals  1 tablet Oral Daily  . saccharomyces boulardii  250 mg Oral BID  . sodium chloride flush   10-40 mL Intracatheter Q12H   Continuous Infusions: . sodium chloride 500 mL (08/06/18 1342)  . ceFEPime (MAXIPIME) IV 1 g (08/06/18 1343)  . famotidine (PEPCID) IV 40 mg (08/06/18 0135)  . vancomycin 1,000 mg (08/06/18 1416)     LOS: 60 days    Time spent: 25 minutes.     Hosie Poisson, MD [facility] Pager [phone]  If 7PM-7AM,  please contact night-coverage [email or web address] Password [facility] 08/06/2018, 4:14 PM

## 2018-08-06 NOTE — Progress Notes (Signed)
CRITICAL VALUE ALERT  Critical Value: Lactic Acid 2.3  Date & Time Notied:  08/06/18 at  1900hrs  Provider Notified: Linton FlemingsX Blount via text page

## 2018-08-06 NOTE — Progress Notes (Signed)
27 Days Post-Op   Subjective/Chief Complaint: 52yo male who is admitted to [facility], [place] following anterior cervical corpectomy of C4 on 03/24/2018 with a complicated postoperative course including development of multiple PEs and DVTs started on anticoagulation, IVF filter placed, and subsequently developed hematomas requiring multiple repeat surgeries for evacuation of hematomas. Currently patient is on bivalirudin and tolerating well.  This morning a chest xray was obtained and there was concern for possible free intraperitoneal air, therefore a dedicated abdominal film was taken which shows a large amount of free intraperitoneal air in the upper abdomen bilaterally. General surgery asked to see. Patient denies any abdominal pain. He is currently tolerating a diet and having bowel function. Stools are loose, nonbloody. Denies n/v. WBC 6.3, afebrile, BP 100/73, HR 97. He has had no prior abdominal surgeries. Last colonoscopy ~2017, per patient it was normal. This was note from emergency general surgery service from 11/21.   CT scan showed after the initial xray on 11/22 showed free air with no source.  He was thought to be asymptomatic and then was given liquids, the surgical time signed off and he was transitioned to eliquis.  He then began having fevers. He still remains without pain but I think this is due to lack of ability to sense pain.  His wbc is normal. His heart rate is 93 with normal blood pressure now.  He underwent repeat ct scan that shows persistent pneumoperitoneum with some stranding at the greater curvature.  I was asked to see him in consultation by Dr Blake Divine. I have reviewed his entire chart. He is passing flatus and had a small bm. No n/v. He had his eliquis at around 10 am today.  Objective: Vital signs in last 24 hours: Temp:  [98.1 F (36.7 C)-101.2 F (38.4 C)] 98.6 F (37 C) (11/24 1631) Pulse Rate:  [92-112] 92 (11/24 1631) Resp:  [9-29] 9 (11/24 1631) BP: (95-124)/(62-80) 107/64  (11/24 1631) SpO2:  [91 %-100 %] 100 % (11/24 1631) Last BM Date: 08/05/18  Intake/Output from previous day: 11/23 0701 - 11/24 0700 In: 2058.9 [P.O.:360; I.V.:201.4; IV Piggyback:1497.5] Out: 1975 [Urine:1575; Stool:400] Intake/Output this shift: Total I/O In: 500 [IV Piggyback:500] Out: 450 [Urine:450]  GI: soft nondistended, nontender, bs present  Lab Results:  Recent Labs    08/05/18 0544 08/06/18 0615  WBC 8.9 8.0  HGB 8.5* 7.5*  HCT 28.3* 24.6*  PLT 210 190   BMET Recent Labs    08/05/18 0544 08/06/18 0615  NA 136 136  K 3.9 3.4*  CL 98 98  CO2 26 30  GLUCOSE 247* 152*  BUN 26* 22*  CREATININE 0.92 0.80  CALCIUM 8.3* 8.2*   PT/INR No results for input(s): LABPROT, INR in the last 72 hours. ABG No results for input(s): PHART, HCO3 in the last 72 hours.  Invalid input(s): PCO2, PO2  Studies/Results: Ct Abdomen W Contrast  Result Date: 08/06/2018 CLINICAL DATA:  52 year old male with history of free air in the abdomen. Follow-up study. EXAM: CT ABDOMEN WITH CONTRAST TECHNIQUE: Multidetector CT imaging of the abdomen was performed using the standard protocol following bolus administration of intravenous contrast. CONTRAST:  OMNIPAQUE IOHEXOL 300 MG/ML  SOLN COMPARISON:  CT the abdomen and pelvis 08/03/2018. FINDINGS: Lower chest: Extensive linear scarring and/or atelectasis in the lower lobes of the lungs bilaterally. Hepatobiliary: No suspicious cystic or solid hepatic lesions. No intra or extrahepatic biliary ductal dilatation. Gallbladder is normal in appearance. Pancreas: No pancreatic mass. No pancreatic ductal dilatation. No pancreatic  or peripancreatic fluid or inflammatory changes. Spleen: Unremarkable. Adrenals/Urinary Tract: Bilateral kidneys and adrenal glands are normal in appearance. No hydroureteronephrosis in the visualized portions of the abdomen. Stomach/Bowel: There is some soft tissue stranding adjacent to the lateral aspect of the greater  curvature of the stomach in the region of the body of the stomach best appreciated on axial image 21 of series 3 and coronal image 56 of series 6), concerning for a perforated gastric ulcer. The remainder of the stomach is otherwise unremarkable in appearance. No pathologic dilatation of visualized small bowel or colon. Vascular/Lymphatic: Aortic atherosclerosis. No lymphadenopathy noted in the abdomen. Other: IVC filter in position with tip terminating below the level of the renal veins. Large volume of pneumatosis again noted. No significant volume of ascites. Musculoskeletal: No aggressive appearing osseous lesions are noted in the visualized portions of the skeleton. IMPRESSION: 1. Persistent pneumoperitoneum. Soft tissue stranding adjacent to the greater curvature of the body of the stomach concerning for potential perforated gastric ulcer. No other definite source of viscus perforation identified on today's examination. 2. Aortic atherosclerosis. 3. Persistent atelectasis and/or scarring in the lower lobes of the lungs bilaterally. Electronically Signed   By: Trudie Reed M.D.   On: 08/06/2018 14:26   Dg Abd 2 Views  Result Date: 08/05/2018 CLINICAL DATA:  Free air in abdomen. EXAM: ABDOMEN - 2 VIEW COMPARISON:  Plain film of the abdomen dated 08/03/2018. FINDINGS: Persistent free intraperitoneal air, best seen on the decubitus image. Visualized bowel gas pattern is nonobstructive. IMPRESSION: Persistent free intraperitoneal air. This finding indicates viscus perforation until proven otherwise. Based on the location of the free intraperitoneal air, I suspect perforation at the level of the gastric fundus or splenic flexure. Recommend repeat abdomen CT with IV and oral contrast for further characterization. These results were called by telephone at the time of interpretation on 08/05/2018 at 8:02 pm to nurse Peach who verbally acknowledged these results. Electronically Signed   By: Bary Richard M.D.    On: 08/05/2018 20:03    Anti-infectives: Anti-infectives (From admission, onward)   Start     Dose/Rate Route Frequency Ordered Stop   08/05/18 1415  vancomycin (VANCOCIN) IVPB 1000 mg/200 mL premix     1,000 mg 200 mL/hr over 60 Minutes Intravenous Every 8 hours 08/05/18 1410     08/05/18 1415  ceFEPIme (MAXIPIME) 1 g in sodium chloride 0.9 % 100 mL IVPB     1 g 200 mL/hr over 30 Minutes Intravenous Every 8 hours 08/05/18 1410     08/04/18 0645  levofloxacin (LEVAQUIN) IVPB 500 mg  Status:  Discontinued     500 mg 100 mL/hr over 60 Minutes Intravenous Every 24 hours 08/04/18 0642 08/05/18 1316   07/25/18 1700  fluconazole (DIFLUCAN) IVPB 400 mg  Status:  Discontinued     400 mg 100 mL/hr over 120 Minutes Intravenous Every 24 hours 07/25/18 1633 07/27/18 1415   07/12/18 2100  ceFAZolin (ANCEF) IVPB 2g/100 mL premix  Status:  Discontinued     2 g 200 mL/hr over 30 Minutes Intravenous Every 8 hours 07/12/18 1555 07/15/18 1050   07/10/18 2229  bacitracin 50,000 Units in sodium chloride 0.9 % 500 mL irrigation  Status:  Discontinued       As needed 07/10/18 2230 07/10/18 2300   07/07/18 1133  bacitracin 50,000 Units in sodium chloride 0.9 % 500 mL irrigation  Status:  Discontinued       As needed 07/07/18 1133 07/07/18 1357  07/06/18 0900  ceFAZolin (ANCEF) IVPB 2g/100 mL premix  Status:  Discontinued     2 g 200 mL/hr over 30 Minutes Intravenous Every 8 hours 07/06/18 0810 07/12/18 1555   07/01/18 2240  bacitracin 50,000 Units in sodium chloride 0.9 % 500 mL irrigation  Status:  Discontinued       As needed 07/01/18 2240 07/01/18 2324   06/30/18 1330  doxycycline (VIBRA-TABS) tablet 100 mg  Status:  Discontinued     100 mg Oral Every 12 hours 06/30/18 1329 07/12/18 1429   06/27/18 1000  fluconazole (DIFLUCAN) tablet 200 mg  Status:  Discontinued     200 mg Oral Daily 06/27/18 0725 07/04/18 0720   06/22/18 2315  ceFAZolin (ANCEF) IVPB 2g/100 mL premix     2 g 200 mL/hr over 30  Minutes Intravenous Every 8 hours 06/22/18 2314 06/27/18 1519   06/22/18 1828  bacitracin 50,000 Units in sodium chloride 0.9 % 500 mL irrigation  Status:  Discontinued       As needed 06/22/18 1828 06/22/18 1915   06/22/18 1700  ceFAZolin (ANCEF) 3 g in dextrose 5 % 50 mL IVPB  Status:  Discontinued     3 g 100 mL/hr over 30 Minutes Intravenous  Once 06/22/18 1647 06/22/18 2101   06/14/18 2100  ceFAZolin (ANCEF) IVPB 2g/100 mL premix     2 g 200 mL/hr over 30 Minutes Intravenous Every 8 hours 06/14/18 1712 06/17/18 0800   06/14/18 1414  bacitracin 50,000 Units in sodium chloride 0.9 % 500 mL irrigation  Status:  Discontinued       As needed 06/14/18 1414 06/14/18 1647   06/14/18 1215  ceFAZolin (ANCEF) 3 g in dextrose 5 % 50 mL IVPB     3 g 160 mL/hr over 30 Minutes Intravenous To [facility] 06/14/18 1202 06/14/18 2225   06/14/18 1200  ceFAZolin (ANCEF) IVPB 2g/100 mL premix  Status:  Discontinued     2 g 200 mL/hr over 30 Minutes Intravenous On call to O.R. 06/14/18 1157 06/14/18 2140      Assessment/Plan: Pneumoperitoneum likely perforated gastric ulcer -I think his lack of symptoms earlier this week and now are related to his spinal issues.  He is not physiologically ill however.   Im not sure if he is actively leaking or we just continue to see residual . There is not really any fluid either.  I told them the standard would be surgery with laparotomy and repair or resection of perforation.  We discussed at lengths those risks.  We discussed options which they are interested in. I think this has been going on for days at this point and even if we "reverse" eliquis there are certainly risks with that.  The other option (which would not be as standard) would be to leave him npo tonight. If he becomes ill (he has gotten a lot better on abx) then will require elap. If not then I think an ugi to assess for a leak tomorrow would be reasonable. If there is a leak he needs surgery and then  would be a little longer from eliquis dose. If there is not a leak then could potentially manage this with tpn and ng tube.  I think he will likely require surgery for this but not unreasonable given his current state and clinical course to hold off for assessment with an ugi.   I discussed this plan at length with the patient, his wife and his parents who are all in  agreement with this plan and understand risks and benefits.  Emelia LoronMatthew Dailynn Nancarrow 08/06/2018

## 2018-08-07 ENCOUNTER — Inpatient Hospital Stay (HOSPITAL_COMMUNITY)

## 2018-08-07 LAB — CBC
HEMATOCRIT: 25.7 % — AB (ref 39.0–52.0)
HEMOGLOBIN: 7.8 g/dL — AB (ref 13.0–17.0)
MCH: 30 pg (ref 26.0–34.0)
MCHC: 30.4 g/dL (ref 30.0–36.0)
MCV: 98.8 fL (ref 80.0–100.0)
Platelets: 180 10*3/uL (ref 150–400)
RBC: 2.6 MIL/uL — ABNORMAL LOW (ref 4.22–5.81)
RDW: 18.9 % — ABNORMAL HIGH (ref 11.5–15.5)
WBC: 7.1 10*3/uL (ref 4.0–10.5)
nRBC: 5.5 % — ABNORMAL HIGH (ref 0.0–0.2)

## 2018-08-07 LAB — COMPREHENSIVE METABOLIC PANEL
ALBUMIN: 2.3 g/dL — AB (ref 3.5–5.0)
ALT: 24 U/L (ref 0–44)
AST: 37 U/L (ref 15–41)
Alkaline Phosphatase: 64 U/L (ref 38–126)
Anion gap: 6 (ref 5–15)
BUN: 15 mg/dL (ref 6–20)
CHLORIDE: 102 mmol/L (ref 98–111)
CO2: 30 mmol/L (ref 22–32)
Calcium: 8.5 mg/dL — ABNORMAL LOW (ref 8.9–10.3)
Creatinine, Ser: 0.76 mg/dL (ref 0.61–1.24)
GFR calc Af Amer: >60 mL/min (ref >60)
GFR calc non Af Amer: >60 mL/min (ref >60)
GLUCOSE: 80 mg/dL (ref 70–99)
POTASSIUM: 3.3 mmol/L — AB (ref 3.5–5.1)
SODIUM: 138 mmol/L (ref 135–145)
Total Bilirubin: 0.8 mg/dL (ref 0.3–1.2)
Total Protein: 5.2 g/dL — ABNORMAL LOW (ref 6.5–8.1)

## 2018-08-07 LAB — GLUCOSE, CAPILLARY
GLUCOSE-CAPILLARY: 93 mg/dL (ref 70–99)
GLUCOSE-CAPILLARY: 74 mg/dL (ref 70–99)
GLUCOSE-CAPILLARY: 71 mg/dL (ref 70–99)
Glucose-Capillary: 64 mg/dL — ABNORMAL LOW (ref 70–99)
Glucose-Capillary: 100 mg/dL — ABNORMAL HIGH (ref 70–99)
Glucose-Capillary: 63 mg/dL — ABNORMAL LOW (ref 70–99)
Glucose-Capillary: 92 mg/dL (ref 70–99)
Glucose-Capillary: 88 mg/dL (ref 70–99)
Glucose-Capillary: 81 mg/dL (ref 70–99)

## 2018-08-07 LAB — IRON AND TIBC
IRON: 37 ug/dL — AB (ref 45–182)
SATURATION RATIOS: 15 % — AB (ref 17.9–39.5)
TIBC: 245 ug/dL — AB (ref 250–450)
UIBC: 208 ug/dL

## 2018-08-07 LAB — APTT: APTT: 59 s — AB (ref 24–36)

## 2018-08-07 LAB — PREPARE RBC (CROSSMATCH)

## 2018-08-07 LAB — FERRITIN: Ferritin: 1415 ng/mL — ABNORMAL HIGH (ref 24–336)

## 2018-08-07 LAB — LACTATE DEHYDROGENASE: LDH: 468 U/L — AB (ref 98–192)

## 2018-08-07 MED ORDER — DEXTROSE 50 % IV SOLN
INTRAVENOUS | Status: AC
  Administered 2018-08-07: 25 mL
  Filled 2018-08-07: qty 50

## 2018-08-07 MED ORDER — SODIUM CHLORIDE 0.9 % IV SOLN
0.1500 mg/kg/h | INTRAVENOUS | Status: DC
  Administered 2018-08-07: 0.15 mg/kg/h via INTRAVENOUS
  Filled 2018-08-07 (×2): qty 250

## 2018-08-07 MED ORDER — FUROSEMIDE 10 MG/ML IJ SOLN
20.0000 mg | Freq: Once | INTRAMUSCULAR | Status: AC
  Administered 2018-08-07: 20 mg via INTRAVENOUS
  Filled 2018-08-07: qty 2

## 2018-08-07 MED ORDER — SODIUM CHLORIDE 0.9% IV SOLUTION
Freq: Once | INTRAVENOUS | Status: AC
  Administered 2018-08-07: 09:00:00 via INTRAVENOUS

## 2018-08-07 MED ORDER — IOHEXOL 300 MG/ML  SOLN
150.0000 mL | Freq: Once | INTRAMUSCULAR | Status: AC | PRN
  Administered 2018-08-07: 300 mL via ORAL

## 2018-08-07 MED ORDER — DEXTROSE 5 % IV SOLN
INTRAVENOUS | Status: DC
  Administered 2018-08-07 (×2): via INTRAVENOUS

## 2018-08-07 NOTE — Progress Notes (Signed)
PT Cancellation Note  Patient Details Name: [name] [name] MRN: [id number] DOB: 1965-12-12   Cancelled Treatment:    Reason Eval/Treat Not Completed: Patient at procedure or test/unavailable   Cressie Betzler B Ariani Seier 08/07/2018, 8:33 AM  Delaney MeigsMaija Tabor Jaiyah Beining, PT Acute Rehabilitation Services Pager: [phone] Office: [phone]

## 2018-08-07 NOTE — Progress Notes (Signed)
Just got on the unit and new orders pop up for patient to receive more RBCs, also an order for lasix and angiomax which were both stopped the day before yesterday by Dr. Blake DivineAkula. Called Dr. Myna HidalgoEnnever about the lasix and he says it is only to be given around the administration of RBCs.. I also asked about restarting the angiomax since patient will "possibly" have surgery related on what they find in radiology this morning. Dr. Myna HidalgoEnnever states that eliquis has to be stopped for 48-72 hours before someone will perform surgery.

## 2018-08-07 NOTE — Progress Notes (Addendum)
Unfortunately, looks like [name] is going to need surgery for this free air in the abdomen.  He was seen over the weekend by Dr. Dwain SarnaWakefield.  That away field, as always, provides very insightful recommendations.  He is on Eliquis.  The Eliquis was stopped yesterday.  I would think that if he needs surgery, the surgeon would wait at least 2 days before he operates.      He really needs to be on some form of anticoagulation given his propensity for thromboembolic disease.  As such, I will get him back onto bivalirudin infusion.  His hemoglobin has dropped.  I suspect he probably is iron deficient.  I also suspect that he spiked a need to have a transfusion.  I will see about getting him set up for 2 units of blood.  He really is quite somber about this.  I really feel bad that something else is gone wrong.  Thankfully, his platelet count has not been a problem.  His last platelet count was 190,000.  His white cell count was 8.  I think his immune system should be okay for surgery if he needs it.  I think that we are going to have to watch out for any potential bleed post surgery.  He had the bleeding post neurosurgery.  Typically, the bleeding happened 2 or 3 days afterwards.  His renal function is doing okay.  His LDH is coming down a little bit.  He is on multiple antibiotics right now.  He is on Maxipime and vancomycin.  Cultures have been taken.  I think he did have a low-grade temperature of 100.5 yesterday.  His blood pressure is doing okay.  His oxygen saturation is doing fine.  His abdominal exam is still pretty much unremarkable.  I really cannot elicit a lot of tenderness with palpation.  Bowel sounds are decreased but active.  He is not eating.  He is n.p.o.  I guess we will have to see what happens with potential for surgery.  I guess he is going to have an upper GI today to look for a leak that may have a contributed to this free air.  I prayed for him again.  Again I just) that  he will be more cheerful.  I can understand how he can be very depressed because of being in the hospital for over a month.  Christin BachPete Jamar Casagrande, MD  1 Thessalonians 5:16-18

## 2018-08-07 NOTE — Progress Notes (Signed)
ANTICOAGULATION CONSULT NOTE   Pharmacy Consult for Bivaliruidin Indication: pulmonary embolus and bilateral DVT  Allergies  Allergen Reactions  . Nsaids Other (See Comments)    G6PD DEFICIENCY  . Sulfa Antibiotics Other (See Comments)    G6PD DEFICIENCY  . Protonix [Pantoprazole Sodium] Diarrhea    Severe diarrhea, Upset stomach    Patient Measurements: Height: 5\' 9"  (175.3 cm) Weight: 281 lb 4.9 oz (127.6 kg) IBW/kg (Calculated) : 70.7 Bivalirudin Dosing Weight: 127.6 kg  Vital Signs: Temp: 98.8 F (37.1 C) (11/25 0653) Temp Source: Axillary (11/25 0653) BP: 115/69 (11/25 0300) Pulse Rate: 92 (11/25 0300)  Labs: Recent Labs    08/05/18 0544 08/06/18 0615  HGB 8.5* 7.5*  HCT 28.3* 24.6*  PLT 210 190  CREATININE 0.92 0.80    Estimated Creatinine Clearance: 142.8 mL/min (by C-G formula based on SCr of 0.8 mg/dL).  Assessment:  52 yr old male with PE/DVT, Eliquis on hold while awaiting possible surgery, for Angiomax.  Last dose of Eliquis yesterday morning.  Goal of Therapy:  APTT 50-70 seconds Monitor platelets by anticoagulation protocol: Yes   Plan:  Start Bivalirudin 0.15 mg/kg/hr APTT in 4 hours.  Geannie RisenGreg Lewis Grivas, PharmD, BCPS

## 2018-08-07 NOTE — Progress Notes (Signed)
[facility] Progress Note  28 Days Post-Op  Subjective: CC: feels tired Denies abd pain, had a loose BM this AM.   Objective: Vital signs in last 24 hours: Temp:  [98.2 F (36.8 C)-100.5 F (38.1 C)] 98.4 F (36.9 C) (11/25 1119) Pulse Rate:  [84-92] 84 (11/25 1119) Resp:  [0-29] 29 (11/25 1119) BP: (95-128)/(62-78) 113/64 (11/25 1119) SpO2:  [95 %-100 %] 95 % (11/25 1119) Last BM Date: 08/05/18  Intake/Output from previous day: 11/24 0701 - 11/25 0700 In: 1253 [I.V.:153; IV Piggyback:1100] Out: 1000 [Urine:1000] Intake/Output this shift: Total I/O In: 1065 [I.V.:250; Blood:315; IV Piggyback:500] Out: -   PE: Gen:  Alert, NAD, cooperative  Card:  Regular rate and rhythm, pedal pulses 2+ BL Pulm:  Normal effort Abd: Soft, non-tender, mild distention, no peritonitis  Skin: warm and dry, no rashes  Psych: A&Ox3, flat affect   Lab Results:  Recent Labs    08/06/18 0615 08/07/18 0500  WBC 8.0 7.1  HGB 7.5* 7.8*  HCT 24.6* 25.7*  PLT 190 180   BMET Recent Labs    08/06/18 0615 08/07/18 0500  NA 136 138  K 3.4* 3.3*  CL 98 102  CO2 30 30  GLUCOSE 152* 80  BUN 22* 15  CREATININE 0.80 0.76  CALCIUM 8.2* 8.5*   PT/INR No results for input(s): LABPROT, INR in the last 72 hours. CMP     Component Value Date/Time   NA 138 08/07/2018 0500   K 3.3 (L) 08/07/2018 0500   CL 102 08/07/2018 0500   CO2 30 08/07/2018 0500   GLUCOSE 80 08/07/2018 0500   BUN 15 08/07/2018 0500   CREATININE 0.76 08/07/2018 0500   CALCIUM 8.5 (L) 08/07/2018 0500   PROT 5.2 (L) 08/07/2018 0500   ALBUMIN 2.3 (L) 08/07/2018 0500   AST 37 08/07/2018 0500   ALT 24 08/07/2018 0500   ALKPHOS 64 08/07/2018 0500   BILITOT 0.8 08/07/2018 0500   GFRNONAA >60 08/07/2018 0500   GFRAA >60 08/07/2018 0500   Lipase  No results found for: LIPASE     Studies/Results: Ct Abdomen W Contrast  Result Date: 08/06/2018 CLINICAL DATA:  52 year old male with history of free air  in the abdomen. Follow-up study. EXAM: CT ABDOMEN WITH CONTRAST TECHNIQUE: Multidetector CT imaging of the abdomen was performed using the standard protocol following bolus administration of intravenous contrast. CONTRAST:  125mL OMNIPAQUE IOHEXOL 300 MG/ML  SOLN COMPARISON:  CT the abdomen and pelvis 08/03/2018. FINDINGS: Lower chest: Extensive linear scarring and/or atelectasis in the lower lobes of the lungs bilaterally. Hepatobiliary: No suspicious cystic or solid hepatic lesions. No intra or extrahepatic biliary ductal dilatation. Gallbladder is normal in appearance. Pancreas: No pancreatic mass. No pancreatic ductal dilatation. No pancreatic or peripancreatic fluid or inflammatory changes. Spleen: Unremarkable. Adrenals/Urinary Tract: Bilateral kidneys and adrenal glands are normal in appearance. No hydroureteronephrosis in the visualized portions of the abdomen. Stomach/Bowel: There is some soft tissue stranding adjacent to the lateral aspect of the greater curvature of the stomach in the region of the body of the stomach best appreciated on axial image 21 of series 3 and coronal image 56 of series 6), concerning for a perforated gastric ulcer. The remainder of the stomach is otherwise unremarkable in appearance. No pathologic dilatation of visualized small bowel or colon. Vascular/Lymphatic: Aortic atherosclerosis. No lymphadenopathy noted in the abdomen. Other: IVC filter in position with tip terminating below the level of the renal veins. Large volume of pneumatosis again noted. No  significant volume of ascites. Musculoskeletal: No aggressive appearing osseous lesions are noted in the visualized portions of the skeleton. IMPRESSION: 1. Persistent pneumoperitoneum. Soft tissue stranding adjacent to the greater curvature of the body of the stomach concerning for potential perforated gastric ulcer. No other definite source of viscus perforation identified on today's examination. 2. Aortic atherosclerosis. 3.  Persistent atelectasis and/or scarring in the lower lobes of the lungs bilaterally. Electronically Signed   By: Trudie Reed M.D.   On: 08/06/2018 14:26   Dg Abd 2 Views  Result Date: 08/05/2018 CLINICAL DATA:  Free air in abdomen. EXAM: ABDOMEN - 2 VIEW COMPARISON:  Plain film of the abdomen dated 08/03/2018. FINDINGS: Persistent free intraperitoneal air, best seen on the decubitus image. Visualized bowel gas pattern is nonobstructive. IMPRESSION: Persistent free intraperitoneal air. This finding indicates viscus perforation until proven otherwise. Based on the location of the free intraperitoneal air, I suspect perforation at the level of the gastric fundus or splenic flexure. Recommend repeat abdomen CT with IV and oral contrast for further characterization. These results were called by telephone at the time of interpretation on 08/05/2018 at 8:02 pm to nurse Peach who verbally acknowledged these results. Electronically Signed   By: Bary Richard M.D.   On: 08/05/2018 20:03   Dg Kayleen Memos W/water Sol Cm  Result Date: 08/07/2018 CLINICAL DATA:  Gastric ulcer.  Pneumoperitoneum.  Rule out source. EXAM: WATER SOLUBLE UPPER GI SERIES TECHNIQUE: Single-column upper GI series was performed using water soluble contrast. CONTRAST:  OMNIPAQUE IOHEXOL 300 MG/ML  SOLN COMPARISON:  CT abdomen 08/06/2018 FLUOROSCOPY TIME:  Fluoroscopy Time:  2 minutes 42 second Radiation Exposure Index (if provided by the fluoroscopic device): Number of Acquired Spot Images: 0 FINDINGS: Patient with paraplegic.  Limited positioning was possible Mild esophageal dilatation and dysmotility. No esophageal stricture or mass. No ulcer or esophageal leak Gastric mucosa is normal. The fundus is normal. Review of recent CT revealed some stranding lateral to the greater curve of the stomach. Gastric mucosa normal in this area without perforation. No ulcer or mass in the stomach. Limited evaluation of the body of the stomach. Antrum and  duodenal bulb normal. Anterior wall of stomach limited evaluation due to difficulty with patient positioning IMPRESSION: Negative for perforation.  No gastric ulcer or mass. Exam somewhat limited by lack of mobility. Anterior wall the stomach not well evaluated. Electronically Signed   By: Marlan Palau M.D.   On: 08/07/2018 10:09    Anti-infectives: Anti-infectives (From admission, onward)   Start     Dose/Rate Route Frequency Ordered Stop   08/05/18 1415  vancomycin (VANCOCIN) IVPB 1000 mg/200 mL premix     1,000 mg 200 mL/hr over 60 Minutes Intravenous Every 8 hours 08/05/18 1410     08/05/18 1415  ceFEPIme (MAXIPIME) 1 g in sodium chloride 0.9 % 100 mL IVPB     1 g 200 mL/hr over 30 Minutes Intravenous Every 8 hours 08/05/18 1410     08/04/18 0645  levofloxacin (LEVAQUIN) IVPB 500 mg  Status:  Discontinued     500 mg 100 mL/hr over 60 Minutes Intravenous Every 24 hours 08/04/18 0642 08/05/18 1316   07/25/18 1700  fluconazole (DIFLUCAN) IVPB 400 mg  Status:  Discontinued     400 mg 100 mL/hr over 120 Minutes Intravenous Every 24 hours 07/25/18 1633 07/27/18 1415   07/12/18 2100  ceFAZolin (ANCEF) IVPB 2g/100 mL premix  Status:  Discontinued     2 g 200 mL/hr over 30  Minutes Intravenous Every 8 hours 07/12/18 1555 07/15/18 1050   07/10/18 2229  bacitracin 50,000 Units in sodium chloride 0.9 % 500 mL irrigation  Status:  Discontinued       As needed 07/10/18 2230 07/10/18 2300   07/07/18 1133  bacitracin 50,000 Units in sodium chloride 0.9 % 500 mL irrigation  Status:  Discontinued       As needed 07/07/18 1133 07/07/18 1357   07/06/18 0900  ceFAZolin (ANCEF) IVPB 2g/100 mL premix  Status:  Discontinued     2 g 200 mL/hr over 30 Minutes Intravenous Every 8 hours 07/06/18 0810 07/12/18 1555   07/01/18 2240  bacitracin 50,000 Units in sodium chloride 0.9 % 500 mL irrigation  Status:  Discontinued       As needed 07/01/18 2240 07/01/18 2324   06/30/18 1330  doxycycline (VIBRA-TABS)  tablet 100 mg  Status:  Discontinued     100 mg Oral Every 12 hours 06/30/18 1329 07/12/18 1429   06/27/18 1000  fluconazole (DIFLUCAN) tablet 200 mg  Status:  Discontinued     200 mg Oral Daily 06/27/18 0725 07/04/18 0720   06/22/18 2315  ceFAZolin (ANCEF) IVPB 2g/100 mL premix     2 g 200 mL/hr over 30 Minutes Intravenous Every 8 hours 06/22/18 2314 06/27/18 1519   06/22/18 1828  bacitracin 50,000 Units in sodium chloride 0.9 % 500 mL irrigation  Status:  Discontinued       As needed 06/22/18 1828 06/22/18 1915   06/22/18 1700  ceFAZolin (ANCEF) 3 g in dextrose 5 % 50 mL IVPB  Status:  Discontinued     3 g 100 mL/hr over 30 Minutes Intravenous  Once 06/22/18 1647 06/22/18 2101   06/14/18 2100  ceFAZolin (ANCEF) IVPB 2g/100 mL premix     2 g 200 mL/hr over 30 Minutes Intravenous Every 8 hours 06/14/18 1712 06/17/18 0800   06/14/18 1414  bacitracin 50,000 Units in sodium chloride 0.9 % 500 mL irrigation  Status:  Discontinued       As needed 06/14/18 1414 06/14/18 1647   06/14/18 1215  ceFAZolin (ANCEF) 3 g in dextrose 5 % 50 mL IVPB     3 g 160 mL/hr over 30 Minutes Intravenous To [facility] 06/14/18 1202 06/14/18 2225   06/14/18 1200  ceFAZolin (ANCEF) IVPB 2g/100 mL premix  Status:  Discontinued     2 g 200 mL/hr over 30 Minutes Intravenous On call to O.R. 06/14/18 1157 06/14/18 2140     Assessment/Plan Pneumoperitoneum, unknown etiology  - CT Abd/Pelvis 11/24 - Soft tissue stranding adjacent to the greater curvature of the body of the stomach  - UGI this morning negative for gastric leak - has been NPO for 3 days, asymptomatic, start trial of clear liquids  - continue Pepcid  - if tolerates a diet, can probably resume anticoagulation tomorrow.   LOS: 61 days    Hosie Spangle, [facility] Pager: [phone]

## 2018-08-07 NOTE — Progress Notes (Signed)
ANTICOAGULATION CONSULT NOTE   Pharmacy Consult for Bivaliruidin Indication: pulmonary embolus and bilateral DVT   Patient Measurements: Height: 5\' 9"  (175.3 cm) Weight: 281 lb 4.9 oz (127.6 kg) IBW/kg (Calculated) : 70.7 Bivalirudin Dosing Weight: 127.6 kg  Vital Signs: Temp: 97.7 F (36.5 C) (11/25 1845) Temp Source: Axillary (11/25 1845) BP: 115/87 (11/25 1845) Pulse Rate: 101 (11/25 1845)  Labs: Recent Labs    08/05/18 0544 08/06/18 0615 08/07/18 0500 08/07/18 1401 08/07/18 1815  HGB 8.5* 7.5* 7.8*  --   --   HCT 28.3* 24.6* 25.7*  --   --   PLT 210 190 180  --   --   APTT  --   --   --  >200* 59*  CREATININE 0.92 0.80 0.76  --   --      Assessment:  52 yr old male with PE/DVT, Eliquis on hold while awaiting possible surgery. Heme/Onc requests bival over heparin. Initial aPTT is therapeutic. Please disregard aPTT > 200 from earlier today, this was drawn incorrectly and therefore is unreliable.   Goal of Therapy:  APTT 50-70 seconds Monitor platelets by anticoagulation protocol: Yes   Plan:  Continue Bivalirudin 0.15 mg/kg/hr aPTT with morning labs   Baldemar FridayMasters, Shamon Cothran M 08/07/2018 6:51 PM

## 2018-08-07 NOTE — Progress Notes (Signed)
Orthopedic Tech Progress Note Patient Details:  [name] [name] 1966-05-22 [id number]  Ortho Devices Type of Ortho Device: Soft collar       [name] [name] 08/07/2018, 10:18 AM

## 2018-08-07 NOTE — Progress Notes (Signed)
PROGRESS NOTE    [name] [name]  ZYS:[id number] DOB: [birth year] DOA: 06/06/2018 PCP: Helane Rima, MD    Brief Narrative: 52 year old man with past medical history relevant for gout, hypertension anterior cervical corpectomy of C4 on 03/24/2018 with comp gated postoperative course including development of multiple PEs and DVTs started on anticoagulation and subsequently developed hematoma requiring reexploration on 03/28/2018 status post placement of IVC filter who was discharged to skilled nursing facility and had a complicated course including progressive lower extremity weakness, intermittent fevers of unclear etiology and hospitalizations for "pneumonia". Patient was admitted to this hospital is a transfer from [facility] with MRI of C-spine that showed postoperative changes and cord compression with cord edema and concern for ischemia. Patient is status post posterior cervical decompression laminectomy of C3, C4, C5, removal of spinous processes, foraminotomies on 06/14/2018. On 06/22/2018 patient began to develop worsening left sided paresthesias and weakness and a stat MRI showed evidence of postoperative hematoma. Patient was taken to the OR with evacuation of hematoma. Additionally his postoperative course has been complicated by recurrent fevers,bilateral lower extremity DVTs on ultrasound,migratory arthralgias/arthritis that are thought to be rheumatologic in nature as well as significant orthostasis limiting the amount he can work with physical therapy. His arthralgia has resolved.   Noted to have decreased left hand grip on 10/19, MRI showed large epidural hematoma, patient s/p clot evacuation.  He was place back on anticoagulation argatroban on 10-22. He was notice to be more bradycardic with HR in the 40 on 10-23. Cardiology was consulted, and argatroban was transition to bivalirrubin to avoid bradycardia. His HR improved after argatroban was discontinue. -HIT panel0.486  (mildly elevated)- SRA (serotonin release assay) negative. Dr Marin Olp has been helping with patient care.   Patient developed weakness of left arm on 10-25. He was take emergently to OR for recurrence of epidural hematoma on 10-25.  He was taken off of anticoagulation.  Patient the night of 10-28 develops left arm weakness, numbness. Stat MRI was ordered, which confirm recurrent hematoma. Patient was taken to OR for hematoma evacuation 10-28. For his thrombocytopenia, a Bone marrow biopsy was done. For LE DVT, he has IVC filter. Patient has received multiple platelet transfusions and Nplate.  Patient underwent CT scan of his chest on 11/13 which showed new pulmonary embolism.  Patient was started on bivalirudin after discussions between hematology as well as neurosurgery.  Platelet counts have improved.  On Friday 11/22 pt was found to have  Free intraabdominal air, it was followed with CT abd pelvis , shows possible stranding at the greater curvature of the stomach.  Surgery consulted and ordered upper GI series which did not show obstruction.  Currently advancing diet.  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  Status post cervical decompressive surgery on 06/14/2018 Status post evacuation of large hematoma and placement of Hemovac drain on 06/22/2018. Patient with decreased left sided handgrip and weakness since 10/19. MRI of the  C-spine shows large  epidural hemo-following which he had several clot evacuation procedures done on 10/19, 10/35, 10/28.  Repeat MRI on 07/23/2018 shows improvement in the fluid collection.  Neurosurgery continues to follow and they plan to taper steroids.  Discussed with Dr. Saintclair Halsted who plans to refer the patient to spine rehab center in [place]. Acute pulmonary embolism/DVT in the setting of spine surgery Status post IVC filter placement on March 28, 2018 patient has been on multiple anticoagulants which has led to recurrent episodes of hematomas at the surgical sites requiring further evacuations.  Case was discussed with vascular surgery Dr. Lonni Fix, interventional radiologist they recommended that mechanical thrombectomy and thrombolysis is not on the cards.  He was kept off anticoagulation for a brief.  Due to recurrent bleeding.  However a CAT scan on the November 13 showed new pulmonary embolism with evidence of right heart.  He was subsequently started on bivalrudin.  So far he has no signs of bleeding.  And platelet counts are improving.  His platelet count  within normal limits.  Eliquis held 11/24 for possible surgery.  Angiomax started the morning of 11/25 by oncology as a bridge to potential surgery.  However, upper GI series came back negative for GI perforation.  GI surgery started advancing diet.  If remains stable, the plan is to stop Angiomax and restart Eliquis 11/26 .   Thrombocytopenia/coagulopathy Patient had extensive work-up for acute thrombocytopenia.   cardiolipin antibody lupus anticoagulant, homocysteine levels factor VIII levels all within normal limits.  Protein S activity was low, protein C activity was greater than 200.  HIT panel was mildly elevated but serotonin release assay was negative.  Over the course of his hospitalization patient had multiple platelet transfusions and his platelet count has slowly increased with endplate infusions on weekly basis.  Oncology following.   Hyperglycemia  secondary to steroids Currently on Lantus and sliding scale insulin. Hemoglobin A1c was 5.8 when last checked.  CBG (last 3)  Recent Labs    08/07/18 0744 08/07/18 1156 08/07/18 1633  GLUCAP 92 71 88   Monitor for hypoglycemic episodes.   Anemia of chronic disease/ anemia of blood loss ? GI ulcer.  Hemoglobin stable from yesterday to today.  Anemia panel on 06/23/2018 normal -2 units of packed red blood cell ordered.  Sinus bradycardia Is currently asymptomatic, TSH was within normal limits.  Plan to avoid AV nodal blocking agents.   Polyarthralgias Resolved   Fluid overload with upper and lower extremity edema  patient has diuresed well with IV lasix, which is being held for sepsis.   Adjustment disorder with depression and anxiety Resume Klonopin as needed.   Essential hypertension well controlled  Acute renal failure Resolved  Stage II pressure ulcer on the right ischial tuberosity not present on admission  wound care consulted and recommendations given.  Oropharyngeal candidiasis He was given a course of fluconazole.   Acute diarrhea Resolved  Fever, hypotension, tachycardia, hypoxic and elevated lactic acid Suspect evolving sepsis from viscous perforation.  Blood cultures done and pending so far, follow them.  Repeat abd film shows persistent and large intraperitoneal free air. It was followed with a CT abd and pelvis showing some stranding of the soft tissue at the greater curvature of the stomach, suspicious for gastric ulcer perforation.  Surgery re consulted and plan for upper GI series in am.  Change IV Levaquin to IV vancomycin and IV cefepime  Trend lactic acid and stop IV Lasix for now.  Keep  map greater than 65.   Continue with strict NPO for now.  Will hold the eliquis for now.    DVT prophylaxis: ted stockings.  Code Status:  Full code Family Communication: family at bedside.  Disposition Plan: pending clinical improvement.     Consultants:   NS Dr Saintclair Halsted  Hematology  Dr Marin Olp.   Cardiology.   Surgery Dr Donne Hazel.    Procedures:  On 10/2 1. posterior cervical decompressive laminectomy at C3, C4, C5 with removal of the spinous process and complete laminectomy and foraminotomies of the C3, C4, C5 nerve roots. 2. Posterior cervical fusion with lateral mass screws at C3-C4-C5 and pars screws at C2 lies in the globus ellipseLateral mass screw system 3. Posterior lateral arthrodesis C2-C5 utilizing locally harvested autograft mixed with vivigen  06/22/2018: Procedure reexploration posterior cervical wound for evacuation of epidural hematoma  06/26/2018 DVT ultrasound:Right: Findings consistent with acute deep vein thrombosis involving the right common femoral vein, right femoral vein, right popliteal vein, right posterior tibial vein, and right peroneal vein. Thrombosis extends proximally into external iliac vein.  Proximal iliac vein not visualized. Left: Findings consistent with acute deep vein thrombosis involving the left common femoral vein, left femoral vein, left popliteal vein, left posterior tibial vein, and left peroneal vein. Findings consistent with acute superficial vein thrombosis  involving the left great saphenous vein. Thrombosis extends proximally into external iliac vein. Proximal iliac vein not visualized.  07/01/18 Clot evacuation   RUE doppler 07/02/18 Antimicrobials: vancomycin and zosyn since 11/23 for sepsis and abdominal infection.   Subjective: HE denies any abd pain , nausea, or vomiting.   Objective: Vitals:   08/07/18 1015 08/07/18 1053 08/07/18 1119 08/07/18 1327  BP:  120/74 113/64 117/64  Pulse: 86 85 84 90  Resp: (!) 0 12 (!) 29 (!) 22  Temp:  98.2 F (36.8 C) 98.4 F (36.9 C) 98.4 F (36.9 C)  TempSrc: Oral Oral Oral Oral  SpO2: 100% 100% 95% 96%  Weight:      Height:        Intake/Output Summary (Last 24 hours) at 08/07/2018 1639 Last data filed  at 08/07/2018 1327 Gross per 24 hour  Intake 2542.66 ml  Output 550 ml  Net 1992.66 ml   Filed Weights   07/07/18 1000 07/18/18 0600 07/27/18 1502  Weight: 123.8 kg 135 kg 127.6 kg    Examination: GENERAL: Morbidly obese.  Lying in bed with neck collar in place.  No distress. HEENT: PERRLA, MMM.  Hearing grossly intact. NECK: Neck collar in place LUNGS:  No IWOB.  Air movement bilaterally.  Difficult exam due to body habitus. HEART: RRR. Heart sounds normal. ABD:  soft, NT with active BS.  Difficult exam due to body habitus EXT:   Can move his right toes but not left toes or extremity NEURO: Awake, alert and oriented appropriately.  Can move his right toes but not left extremity.  Moves both upper extremities PSYCH: normal affect  Data Reviewed: I have personally reviewed following labs and imaging studies  CBC: Recent Labs  Lab 08/03/18 0450 08/04/18 0500 08/05/18 0544 08/06/18 0615 08/07/18 0500  WBC 6.3 8.2 8.9 8.0 7.1  HGB 9.0* 9.1* 8.5* 7.5* 7.8*  HCT 28.3* 28.8* 28.3* 24.6* 25.7*  MCV 97.6 95.0 101.8* 99.2 98.8  PLT 160 224 210 190 149   Basic Metabolic Panel: Recent Labs  Lab 08/03/18 0846 08/04/18 0500 08/05/18 0544 08/06/18 0615 08/07/18 0500  NA 140 141 136 136 138  K 3.8  4.0 3.9 3.4* 3.3*  CL 104 106 98 98 102  CO2 '28 27 26 30 30  ' GLUCOSE 127* 71 247* 152* 80  BUN 31* 28* 26* 22* 15  CREATININE 0.66 0.60* 0.92 0.80 0.76  CALCIUM 8.9 8.7* 8.3* 8.2* 8.5*   GFR: Estimated Creatinine Clearance: 142.8 mL/min (by C-G formula based on SCr of 0.76 mg/dL). Liver Function Tests: Recent Labs  Lab 08/03/18 0846 08/04/18 0500 08/05/18 0544 08/06/18 0615 08/07/18 0500  AST 34 38 47* 39 37  ALT 27 27 33 27 24  ALKPHOS 60 54 77 68 64  BILITOT 0.8 0.7 0.6 0.6 0.8  PROT 5.5* 5.5* 5.5* 5.1* 5.2*  ALBUMIN 2.8* 2.7* 2.7* 2.3* 2.3*   No results for input(s): LIPASE, AMYLASE in the last 168 hours. No results for input(s): AMMONIA in the last 168  hours. Coagulation Profile: No results for input(s): INR, PROTIME in the last 168 hours. Cardiac Enzymes: No results for input(s): CKTOTAL, CKMB, CKMBINDEX, TROPONINI in the last 168 hours. BNP (last 3 results) No results for input(s): PROBNP in the last 8760 hours. HbA1C: No results for input(s): HGBA1C in the last 72 hours. CBG: Recent Labs  Lab 08/07/18 0507 08/07/18 0652 08/07/18 0744 08/07/18 1156 08/07/18 1633  GLUCAP 100* 74 92 71 88   Lipid Profile: No results for input(s): CHOL, HDL, LDLCALC, TRIG, CHOLHDL, LDLDIRECT in the last 72 hours. Thyroid Function Tests: No results for input(s): TSH, T4TOTAL, FREET4, T3FREE, THYROIDAB in the last 72 hours. Anemia Panel: No results for input(s): VITAMINB12, FOLATE, FERRITIN, TIBC, IRON, RETICCTPCT in the last 72 hours. Sepsis Labs: Recent Labs  Lab 08/05/18 1141 08/05/18 1812 08/06/18 1802  LATICACIDVEN 5.0* 3.0* 2.3*    Recent Results (from the past 240 hour(s))  Culture, blood (Routine X 2) w Reflex to ID Panel     Status: None (Preliminary result)   Collection Time: 08/05/18  9:40 AM  Result Value Ref Range Status   Specimen Description BLOOD RIGHT ANTECUBITAL  Final   Special Requests   Final    BOTTLES DRAWN AEROBIC ONLY Blood Culture adequate volume   Culture   Final    NO GROWTH 2 DAYS Performed at [facility], [place] [address] [place], [place] [address]    Report Status PENDING  Incomplete  Culture, blood (Routine X 2) w Reflex to ID Panel     Status: None (Preliminary result)   Collection Time: 08/05/18  9:47 AM  Result Value Ref Range Status   Specimen Description BLOOD RIGHT ANTECUBITAL  Final   Special Requests   Final    BOTTLES DRAWN AEROBIC ONLY Blood Culture adequate volume   Culture   Final    NO GROWTH 2 DAYS Performed at [facility], [place] [address], [place], [place] [address]    Report Status PENDING  Incomplete         Radiology Studies: Ct Abdomen W Contrast  Result  Date: 08/06/2018 CLINICAL DATA:  52 year old male with history of free air in the abdomen. Follow-up study. EXAM: CT ABDOMEN WITH CONTRAST TECHNIQUE: Multidetector CT imaging of the abdomen was performed using the standard protocol following bolus administration of intravenous contrast. CONTRAST:  133m OMNIPAQUE IOHEXOL 300 MG/ML  SOLN COMPARISON:  CT the abdomen and pelvis 08/03/2018. FINDINGS: Lower chest: Extensive linear scarring and/or atelectasis in the lower lobes of the lungs bilaterally. Hepatobiliary: No suspicious cystic or solid hepatic lesions. No intra or extrahepatic biliary ductal dilatation. Gallbladder is normal in appearance.  Pancreas: No pancreatic mass. No pancreatic ductal dilatation. No pancreatic or peripancreatic fluid or inflammatory changes. Spleen: Unremarkable. Adrenals/Urinary Tract: Bilateral kidneys and adrenal glands are normal in appearance. No hydroureteronephrosis in the visualized portions of the abdomen. Stomach/Bowel: There is some soft tissue stranding adjacent to the lateral aspect of the greater curvature of the stomach in the region of the body of the stomach best appreciated on axial image 21 of series 3 and coronal image 56 of series 6), concerning for a perforated gastric ulcer. The remainder of the stomach is otherwise unremarkable in appearance. No pathologic dilatation of visualized small bowel or colon. Vascular/Lymphatic: Aortic atherosclerosis. No lymphadenopathy noted in the abdomen. Other: IVC filter in position with tip terminating below the level of the renal veins. Large volume of pneumatosis again noted. No significant volume of ascites. Musculoskeletal: No aggressive appearing osseous lesions are noted in the visualized portions of the skeleton. IMPRESSION: 1. Persistent pneumoperitoneum. Soft tissue stranding adjacent to the greater curvature of the body of the stomach concerning for potential perforated gastric ulcer. No other definite source of viscus  perforation identified on today's examination. 2. Aortic atherosclerosis. 3. Persistent atelectasis and/or scarring in the lower lobes of the lungs bilaterally. Electronically Signed   By: Vinnie Langton M.D.   On: 08/06/2018 14:26   Dg Abd 2 Views  Result Date: 08/05/2018 CLINICAL DATA:  Free air in abdomen. EXAM: ABDOMEN - 2 VIEW COMPARISON:  Plain film of the abdomen dated 08/03/2018. FINDINGS: Persistent free intraperitoneal air, best seen on the decubitus image. Visualized bowel gas pattern is nonobstructive. IMPRESSION: Persistent free intraperitoneal air. This finding indicates viscus perforation until proven otherwise. Based on the location of the free intraperitoneal air, I suspect perforation at the level of the gastric fundus or splenic flexure. Recommend repeat abdomen CT with IV and oral contrast for further characterization. These results were called by telephone at the time of interpretation on 08/05/2018 at 8:02 pm to nurse Peach who verbally acknowledged these results. Electronically Signed   By: Franki Cabot M.D.   On: 08/05/2018 20:03   Dg Duanne Limerick W/water Sol Cm  Result Date: 08/07/2018 CLINICAL DATA:  Gastric ulcer.  Pneumoperitoneum.  Rule out source. EXAM: WATER SOLUBLE UPPER GI SERIES TECHNIQUE: Single-column upper GI series was performed using water soluble contrast. CONTRAST:  362m OMNIPAQUE IOHEXOL 300 MG/ML  SOLN COMPARISON:  CT abdomen 08/06/2018 FLUOROSCOPY TIME:  Fluoroscopy Time:  2 minutes 42 second Radiation Exposure Index (if provided by the fluoroscopic device): Number of Acquired Spot Images: 0 FINDINGS: Patient with paraplegic.  Limited positioning was possible Mild esophageal dilatation and dysmotility. No esophageal stricture or mass. No ulcer or esophageal leak Gastric mucosa is normal. The fundus is normal. Review of recent CT revealed some stranding lateral to the greater curve of the stomach. Gastric mucosa normal in this area without perforation. No ulcer or mass  in the stomach. Limited evaluation of the body of the stomach. Antrum and duodenal bulb normal. Anterior wall of stomach limited evaluation due to difficulty with patient positioning IMPRESSION: Negative for perforation.  No gastric ulcer or mass. Exam somewhat limited by lack of mobility. Anterior wall the stomach not well evaluated. Electronically Signed   By: CFranchot GalloM.D.   On: 08/07/2018 10:09        Scheduled Meds: . sodium chloride   Intravenous Once  . Chlorhexidine Gluconate Cloth  6 each Topical Q0600  . clonazePAM  0.5 mg Oral BID  . gabapentin  300 mg  Oral TID  . Gerhardt's butt cream   Topical BID  . Influenza vac split quadrivalent PF  0.5 mL Intramuscular Tomorrow-1000  . insulin aspart  0-15 Units Subcutaneous TID WC  . insulin aspart  0-5 Units Subcutaneous QHS  . insulin glargine  35 Units Subcutaneous QHS  . lipase/protease/amylase  36,000 Units Oral TID AC  . magic mouthwash w/lidocaine  5 mL Oral TID  . methocarbamol  750 mg Oral QID  . midodrine  5 mg Oral BID WC  . multivitamin with minerals  1 tablet Oral Daily  . saccharomyces boulardii  250 mg Oral BID  . sodium chloride flush  10-40 mL Intracatheter Q12H   Continuous Infusions: . sodium chloride Stopped (08/06/18 1613)  . bivalirudin (ANGIOMAX) infusion 0.5 mg/mL (Non-ACS indications) 0.15 mg/kg/hr (08/07/18 1110)  . ceFEPime (MAXIPIME) IV 1 g (08/07/18 1550)  . dextrose 50 mL/hr at 08/07/18 1334  . famotidine (PEPCID) IV 40 mg (08/06/18 0135)  . vancomycin 1,000 mg (08/07/18 0648)     LOS: 61 days   Taye T. [facility] Pager [phone]  If 7PM-7AM, please contact night-coverage [email or web address] Password [facility] 08/07/2018, 4:39 PM

## 2018-08-07 NOTE — Progress Notes (Signed)
Occupational Therapy Treatment Patient Details Name: [name] [name] MRN: [id number] DOB: Mar 19, 1966 Today's Date: 08/07/2018    History of present illness 52 y.o. male admitted on 06/06/18 for bil LE weakness and muscle spasm after multiple falls (with resultant R knee meniscus tear placed in bledsoe brace) following a recent ACDF at C3 in July 2019 (complicated by PE (s/p IVC filter), CIR, and then SNF placement before finally returning home with his parents).  In the ED MRI of c-spine which showed post operative changes and cord edema and possibility of ischemia.  His R knee was aspirated on 06/09/18.  S/p posterior cervical decompression and fusion on 06/14/18. Entire acute course complicated by hypotension in standing (thought to be autonomic) and by finding of multiple LE DVTs (already has an IVC filter in place, but per MDs notes at risk for blocking the filter with these clots).  Pt with post op issues with incisional bleeding s/p 2 evacuations of hematomas and placement of hemovac (06/22/18 and 07/01/18) as well as multiple units of plasma.  Cardiology consulted 07/05/18 due to bradycardia thought to be from his newly started blood thinner.  Pt with another re-accumulation of epidural hematoma with increased L sided weakness and cord compression with emergent evacuation surgery 10/25 & 07/10/18.  Pt also needing more platelets post op.  Bone marrow bx 07/12/18.  Pt found to have submassive PE on 07/27/18 and re-started on anticoagulation. PMHx of HTN, gout, anemia, DDD (lumbar)   OT comments  Pt appearing somewhat down and with flat affect today, though agreeable to working with OT, opening up more as session progressed. Continued focus on UE ROM, strength, and control. Performed continued P/AAROM to LUE and A/AAROM to RUE with incorporation of 1lb dumbbell into RUE work for continued RUE strengthening. Pt appreciative of working with OT and continues to verbalize motivation/desire to improve  strength and use of UEs. Will continue per POC.    Follow Up Recommendations  SNF;Supervision/Assistance - 24 hour    Equipment Recommendations  Other (comment)(defer to next venue)          Precautions / Restrictions Precautions Precautions: Fall;Cervical;Other (comment) Precaution Comments: Several bed sores on bottom.   Required Braces or Orthoses: Cervical Brace Cervical Brace: Soft collar Restrictions Weight Bearing Restrictions: No       Mobility                                General transfer comment: deferred this session; useof maximove for transfers OOB at this time                                               ADL either performed or assessed with clinical judgement   ADL Overall ADL's : Needs assistance/impaired                                       General ADL Comments: continued focus on UE strengthening P/A/AAROM (per pt request). utilized 1lb dumbbell for RUE                        Cognition Arousal/Alertness: Awake/alert Behavior During Therapy: WFL for tasks assessed/performed;Flat affect Overall Cognitive Status: Within Functional Limits for  tasks assessed                                 General Comments: pt with more flat affect today, though opens up a little more as session progressed        Exercises Exercises: General Upper Extremity General Exercises - Upper Extremity Shoulder Flexion: PROM;Left;20 reps;Supine Shoulder Extension: PROM;Left;20 reps;Supine Shoulder ABduction: AROM;AAROM;Right;10 reps Shoulder ADduction: AROM;AAROM;Right;10 reps Shoulder Horizontal ABduction: PROM;Left;20 reps;Supine;AROM;AAROM;Right;10 reps Shoulder Horizontal ADduction: PROM;Left;20 reps;Supine;AROM;AAROM;Right;10 reps Elbow Flexion: PROM;AAROM;20 reps;Left;Supine;AROM;Right;10 reps;Bar weights/barbell Bar Weights/Barbell (Elbow Flexion): 1 lb(RUE only) Elbow Extension: PROM;Left;20  reps;Supine;AROM;Right;10 reps;Bar weights/barbell Bar Weights/Barbell (Elbow Extension): 1 lb(RUE only) Wrist Flexion: PROM;AAROM;20 reps;Left Wrist Extension: PROM;AAROM;Left;20 reps Digit Composite Flexion: AROM;20 reps;Left Composite Extension: AROM;Left;15 reps   Shoulder Instructions       General Comments      Pertinent Vitals/ Pain       Pain Assessment: Faces Faces Pain Scale: Hurts a little bit Pain Location: posterior neck Pain Descriptors / Indicators: Aching  Home Living                                          Prior Functioning/Environment              Frequency  Min 2X/week        Progress Toward Goals  OT Goals(current goals can now be found in the care plan section)  Progress towards OT goals: Progressing toward goals  Acute Rehab OT Goals Patient Stated Goal: to continue working on his arms OT Goal Formulation: With patient Time For Goal Achievement: 08/22/18 Potential to Achieve Goals: Fair  Plan Discharge plan remains appropriate    Co-evaluation                 AM-PAC OT "6 Clicks" Daily Activity     Outcome Measure   Help from another person eating meals?: A Little Help from another person taking care of personal grooming?: A Little Help from another person toileting, which includes using toliet, bedpan, or urinal?: Total Help from another person bathing (including washing, rinsing, drying)?: A Lot Help from another person to put on and taking off regular upper body clothing?: Total Help from another person to put on and taking off regular lower body clothing?: Total 6 Click Score: 11    End of Session Equipment Utilized During Treatment: Cervical collar  OT Visit Diagnosis: Other abnormalities of gait and mobility (R26.89);Muscle weakness (generalized) (M62.81);Pain   Activity Tolerance Patient tolerated treatment well   Patient Left in bed;with call bell/phone within reach   Nurse Communication  Mobility status        Time: 1610-9604 OT Time Calculation (min): 32 min  Charges: OT General Charges $OT Visit: 1 Visit OT Treatments $Therapeutic Activity: 23-37 mins  Marcy Siren, OT [facility] Pager [phone] Office [phone]    [name] [name] 08/07/2018, 3:49 PM

## 2018-08-08 LAB — BASIC METABOLIC PANEL
Anion gap: 7 (ref 5–15)
BUN: 13 mg/dL (ref 6–20)
CO2: 27 mmol/L (ref 22–32)
CREATININE: 0.62 mg/dL (ref 0.61–1.24)
Calcium: 8.4 mg/dL — ABNORMAL LOW (ref 8.9–10.3)
Chloride: 103 mmol/L (ref 98–111)
GFR calc non Af Amer: >60 mL/min (ref >60)
Glucose, Bld: 75 mg/dL (ref 70–99)
Potassium: 2.6 mmol/L — CL (ref 3.5–5.1)
Sodium: 137 mmol/L (ref 135–145)

## 2018-08-08 LAB — LACTATE DEHYDROGENASE: LDH: 513 U/L — ABNORMAL HIGH (ref 98–192)

## 2018-08-08 LAB — GLUCOSE, CAPILLARY
GLUCOSE-CAPILLARY: 88 mg/dL (ref 70–99)
GLUCOSE-CAPILLARY: 173 mg/dL — AB (ref 70–99)
GLUCOSE-CAPILLARY: 149 mg/dL — AB (ref 70–99)
Glucose-Capillary: 52 mg/dL — ABNORMAL LOW (ref 70–99)
Glucose-Capillary: 81 mg/dL (ref 70–99)

## 2018-08-08 LAB — BPAM RBC
BLOOD PRODUCT EXPIRATION DATE: 201912212359
Blood Product Expiration Date: 201912192359
Blood Product Expiration Date: 201912192359
ISSUE DATE / TIME: 201911251817
ISSUE DATE / TIME: 201911241602
ISSUE DATE / TIME: 201911251035
UNIT TYPE AND RH: 5100
Unit Type and Rh: 5100
Unit Type and Rh: 5100

## 2018-08-08 LAB — APTT
aPTT: 88 seconds — ABNORMAL HIGH (ref 24–36)
aPTT: 76 seconds — ABNORMAL HIGH (ref 24–36)
aPTT: 73 seconds — ABNORMAL HIGH (ref 24–36)

## 2018-08-08 LAB — TYPE AND SCREEN
ABO/RH(D): O POS
Antibody Screen: NEGATIVE
UNIT DIVISION: 0
Unit division: 0
Unit division: 0

## 2018-08-08 LAB — CBC
HCT: 32.3 % — ABNORMAL LOW (ref 39.0–52.0)
HEMOGLOBIN: 9.9 g/dL — AB (ref 13.0–17.0)
MCH: 28.9 pg (ref 26.0–34.0)
MCHC: 30.7 g/dL (ref 30.0–36.0)
MCV: 94.2 fL (ref 80.0–100.0)
Platelets: 177 10*3/uL (ref 150–400)
RBC: 3.43 MIL/uL — ABNORMAL LOW (ref 4.22–5.81)
RDW: 20.4 % — ABNORMAL HIGH (ref 11.5–15.5)
WBC: 9.4 10*3/uL (ref 4.0–10.5)
nRBC: 5.2 % — ABNORMAL HIGH (ref 0.0–0.2)

## 2018-08-08 LAB — MTHFR DNA ANALYSIS

## 2018-08-08 MED ORDER — INSULIN GLARGINE 100 UNIT/ML ~~LOC~~ SOLN
20.0000 [IU] | Freq: Every day | SUBCUTANEOUS | Status: DC
  Administered 2018-08-08 – 2018-08-18 (×10): 20 [IU] via SUBCUTANEOUS
  Filled 2018-08-08 (×12): qty 0.2

## 2018-08-08 MED ORDER — ENSURE ENLIVE PO LIQD
237.0000 mL | Freq: Two times a day (BID) | ORAL | Status: DC
  Administered 2018-08-09 – 2018-08-15 (×12): 237 mL via ORAL

## 2018-08-08 MED ORDER — INSULIN GLARGINE 100 UNIT/ML ~~LOC~~ SOLN: 25.0000 [IU] | Freq: Every day | SUBCUTANEOUS | Status: DC

## 2018-08-08 MED ORDER — SODIUM CHLORIDE 0.9 % IV SOLN
0.0900 mg/kg/h | INTRAVENOUS | Status: DC
  Administered 2018-08-08: 0.09 mg/kg/h via INTRAVENOUS
  Administered 2018-08-08: 0.11 mg/kg/h via INTRAVENOUS
  Administered 2018-08-09 – 2018-08-10 (×2): 0.09 mg/kg/h via INTRAVENOUS
  Filled 2018-08-08 (×4): qty 250

## 2018-08-08 MED ORDER — DEXTROSE 50 % IV SOLN
INTRAVENOUS | Status: AC
  Administered 2018-08-08: 25 mL
  Filled 2018-08-08: qty 50

## 2018-08-08 MED ORDER — SODIUM CHLORIDE 0.9 % IV SOLN
510.0000 mg | Freq: Once | INTRAVENOUS | Status: AC
  Administered 2018-08-08: 510 mg via INTRAVENOUS
  Filled 2018-08-08: qty 17

## 2018-08-08 MED ORDER — POTASSIUM CHLORIDE 20 MEQ/15ML (10%) PO SOLN
40.0000 meq | ORAL | Status: AC
  Administered 2018-08-08 (×3): 40 meq via ORAL
  Filled 2018-08-08 (×3): qty 30

## 2018-08-08 NOTE — Progress Notes (Addendum)
CRITICAL VALUE ALERT  Critical Value:  Potassium 2.6  Date & Time Notied:  08/08/18 0740  Provider Notified: Mitchel HonourP. Joseph MD [phone]  Orders Received/Actions taken: Orders placed, MD updated

## 2018-08-08 NOTE — Progress Notes (Addendum)
ANTICOAGULATION CONSULT NOTE   Pharmacy Consult for Bivalirudin  Indication: pulmonary embolus and bilateral DVT   Patient Measurements: Height: 5\' 9"  (175.3 cm) Weight: 281 lb 4.9 oz (127.6 kg) IBW/kg (Calculated) : 70.7 Bivalirudin Dosing Weight: 127.6 kg  Vital Signs: Temp: 99.2 F (37.3 C) (11/26 0702) Temp Source: Oral (11/26 0702) BP: 112/65 (11/26 0800) Pulse Rate: 85 (11/26 0800)  Labs: Recent Labs    08/06/18 0615 08/07/18 0500  08/07/18 1815 08/08/18 0545 08/08/18 0955  HGB 7.5* 7.8*  --   --   --   --   HCT 24.6* 25.7*  --   --   --   --   PLT 190 180  --   --   --   --   APTT  --   --    < > 59* 88* 76*  CREATININE 0.80 0.76  --   --  0.62  --    < > = values in this interval not displayed.     Assessment:  52 yr old male with PE/DVT, Eliquis on hold while awaiting possible surgery. Heme/Onc requests bival over heparin. Initial aPTT is therapeutic. Please disregard aPTT > 200 from earlier today, this was drawn incorrectly and therefore is unreliable.   11/26 AM update: aPTT down a little to 76, still borderline high.  Goal of Therapy:  APTT 50-70 seconds Monitor platelets by anticoagulation protocol: Yes   Plan:  Decrease bilvalirudin to 0.1mg /kg/hr (25.815ml/hr) Monitor daily aPTT, CBC, s/s of bleed Change back to Eliquis soon  ADDENDUM:  Last aPTT is 73.  Plan: Decrease bivalirudin 0.09mg /kg/hr  Monitor daily aPTT, CBC, s/s of bleed  Enzo BiNathan Layman Gully, PharmD, BCPS Clinical Pharmacist Phone number [phone] 08/08/2018 3:14 PM

## 2018-08-08 NOTE — Progress Notes (Signed)
PROGRESS NOTE    [name] [name]  BTD:[id number] DOB: 1965-12-16 DOA: 06/06/2018 PCP: Helane Rima, MD    Brief Narrative: 52/M with PMH of gout, hypertension, underwent anterior cervical corpectomy of C4 on 03/24/2018 with comp gated postoperative course including development of multiple PEs and DVTs started on anticoagulation and subsequently developed hematoma requiring reexploration on 03/28/2018 status post placement of IVC filter who was discharged to skilled nursing facility and had a complicated course including progressive lower extremity weakness, intermittent fevers of unclear etiology and hospitalizations for "pneumonia". Patient was admitted to this hospital as a [facility] with MRI of C-spine that showed postoperative changes and cord compression with cord edema and concern for ischemia. -status post posterior cervical decompression laminectomy of C3, C4, C5, removal of spinous processes, foraminotomies on 06/14/2018.  -On 06/22/2018 patient began to develop worsening left sided paresthesias and weakness and a stat MRI showed evidence of postoperative hematoma. Patient was taken to the OR with evacuation of hematoma.  -Additionally developed Bilateral lower extremity DVTs -on 10/9 was noted to have decreased left hand grip on 10/19, MRI showed large epidural hematoma, patient s/p clot evacuation.  He was placed back on anticoagulation argatroban on 10-22. He was notice to be more bradycardic with HR in the 40 on 10-23. Cardiology was consulted, and argatroban was transition to bivalirrubin to avoid bradycardia. His HR improved after argatroban was discontinued. -HIT panel0.486 (mildly elevated)- SRA (serotonin release assay) negative. Dr Marin Olp has been helping with patient care.  -On 10/25; developed weakness of left arm, He was take emergently to OR for recurrence of epidural hematoma on 10-25.  He was taken off of anticoagulation.   -10/28 Night: developed  left arm weakness, numbness. Stat MRI was ordered, which confirmed recurrent hematoma. Patient was taken to OR for hematoma evacuation 10-28.  -For his thrombocytopenia, Heme Dr.Ennever following, Bone marrow biopsy was done. For LE DVT, he has IVC filter. Patient has received multiple platelet transfusions and Nplate.  Patient underwent CT scan of his chest on 11/13 which showed new pulmonary embolism.  Patient was started on bivalirudin after discussions between hematology as well as neurosurgery.  Platelet counts have improved.  On Friday 11/22 pt was found to have  Free intraabdominal air, it was followed with CT abd pelvis , shows possible stranding at the greater curvature of the stomach.  Surgery consulted and ordered upper GI series which did not show obstruction.  Currently advancing diet.  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  -Complicated postop course -Status post cervical decompressive surgery on 06/14/2018 -Status post evacuation of large hematoma and placement of Hemovac drain on 06/22/2018. -After this patient had multiple more clot evacuation procedures on 10/19, 10/25, 10/28  -Repeat MRI on 07/23/2018 shows improvement in the fluid collection.  Neurosurgery continues to follow -now stable per NSG from spine standpoint -NSG Dr. Saintclair Halsted plans to refer the patient to spine rehab center in [place].  Acute pulmonary embolism/DVT in the setting of spine surgery Status post IVC filter placement on March 28, 2018 patient has been on multiple anticoagulants which has led to recurrent episodes of hematomas at the surgical sites requiring further evacuations.  Case was discussed with vascular surgery Dr. Lonni Fix, interventional radiologist they recommended that mechanical thrombectomy and thrombolysis is not on the cards.  He was kept off anticoagulation for a brief period, due to recurrent bleeding/hematomas -However a CT scan on the November 13  showed new pulmonary  embolism with evidence of right heart.  He was subsequently started on bivalrudin.  So far he has no signs of bleeding.  And platelet counts is improving.   -Then subsequently due to pneumoperitoneum,  Eliquis held 11/24, Angiomax started the morning of 11/25 by oncology as a bridge to potential surgery.  However, upper GI series came back negative for GI perforation.  surgery started advancing diet.  -Will resume Eliquis tomorrow after discussion with surgery  Pneumoperitoneum -Appreciate general surgery consult -CT abdomen pelvis 11/24 with soft tissue stranding adjacent to the greater curvature of the body of the stomach, upper GI series on 1125 was negative for leak/perforation -At this time remains afebrile on broad-spectrum antibiotics, tolerating liquid diet and having bowel movements -Suspect he might have had a microperforation which has sealed off -Complete a 10-day course of antibiotics transition to Augmentin in 48 hours, continue PPI for stress ulcer prophylaxis -Resume anticoagulation with Eliquis tomorrow if stable  Thrombocytopenia/coagulopathy Patient had extensive work-up for acute thrombocytopenia.   cardiolipin antibody lupus anticoagulant, homocysteine levels factor VIII levels all within normal limits.  Protein S activity was low, protein C activity was greater than 200.  HIT panel was mildly elevated but serotonin release assay was negative.  Over the course of his hospitalization patient had multiple platelet transfusions and his platelet count has slowly increased with endplate infusions on weekly basis.  Oncology following. -Now thrombocytopenia has resolved  Hyperglycemia secondary to steroids Currently on Lantus and sliding scale insulin. Hemoglobin A1c was 5.8 when last checked.  Anemia due to acute illness -Stable, improving  Sinus bradycardia Is currently asymptomatic, TSH was within normal limits.  Plan to avoid AV nodal blocking  agents.  Fluid overload with upper and lower extremity edema  -Third spacing patient has diuresed well with IV lasix -Now held  Adjustment disorder with depression and anxiety Resume Klonopin as needed.  Essential hypertension well controlled  Acute renal failure Resolved  Stage II pressure ulcer on the right ischial tuberosity not present on admission  wound care consulted and recommendations given.  Oropharyngeal candidiasis He was given a course of fluconazole.   Acute diarrhea Resolved   DVT prophylaxis: ted stockings.  Code Status:  Full code Family Communication: family at bedside.  Disposition Plan: pending clinical improvement.    Consultants:   NS Dr Saintclair Halsted  Hematology  Dr Marin Olp.   Cardiology.   Surgery Dr Donne Hazel.    Procedures:  On 10/2 1. posterior cervical decompressive laminectomy at C3, C4, C5 with removal of the spinous process and complete laminectomy and foraminotomies of the C3, C4, C5 nerve roots. 2. Posterior cervical fusion with lateral mass screws at C3-C4-C5 and pars screws at C2 lies in the globus ellipseLateral mass screw system 3. Posterior lateral arthrodesis C2-C5 utilizing locally harvested autograft mixed with vivigen  06/22/2018: Procedure reexploration posterior cervical wound for evacuation of epidural hematoma  06/26/2018 DVT ultrasound:Right: Findings consistent with acute deep vein thrombosis involving the right common femoral vein, right femoral vein, right popliteal vein, right posterior tibial vein, and right peroneal vein. Thrombosis extends proximally into external iliac vein.  Proximal iliac vein not visualized. Left: Findings consistent with acute deep vein thrombosis involving the left common femoral vein, left femoral vein, left popliteal vein, left posterior tibial vein, and left peroneal vein. Findings consistent with acute superficial vein thrombosis  involving the left great saphenous vein. Thrombosis  extends proximally into external iliac vein. Proximal iliac vein not visualized.  07/01/18 Clot evacuation  RUE doppler 07/02/18 Antimicrobials: vancomycin and zosyn since 11/23 for sepsis and abdominal infection.   Subjective: -No new complaints, depressed about to prolonged hospital course, no changes from a neurological standpoint -Tolerating liquid diet and having bowel movements  Objective: Vitals:   08/08/18 0702 08/08/18 0800 08/08/18 1045 08/08/18 1200  BP: 103/72 112/65 122/79 (!) 158/88  Pulse: 89 85 93 85  Resp: (!) 22 (!) 22 (!) 9 (!) 22  Temp: 99.2 F (37.3 C)   98.4 F (36.9 C)  TempSrc: Oral   Oral  SpO2: 97% 97% 90% 98%  Weight:      Height:        Intake/Output Summary (Last 24 hours) at 08/08/2018 1258 Last data filed at 08/08/2018 1200 Gross per 24 hour  Intake 3196.88 ml  Output 1025 ml  Net 2171.88 ml   Filed Weights   07/07/18 1000 07/18/18 0600 07/27/18 1502  Weight: 123.8 kg 135 kg 127.6 kg    Examination: Gen: Obese chronically ill-appearing male with c-collar HEENT: PERRLA Lungs: Good air movement bilaterally, CTAB CVS: 1 S2/regular rate rhythm Abd: soft, Non tender, non distended, BS present Extremities: No edema NEURO: Awake, alert and oriented appropriately.  Right upper extremity strength is 5/5, left upper extremity has decreased proximal muscle strength and 4+ in the distal forearm  -Bilateral lower extremities profoundly weak, able to wiggle his toes on the right  PSYCH: flat affect  Data Reviewed: I have personally reviewed following labs and imaging studies  CBC: Recent Labs  Lab 08/04/18 0500 08/05/18 0544 08/06/18 0615 08/07/18 0500 08/08/18 0955  WBC 8.2 8.9 8.0 7.1 9.4  HGB 9.1* 8.5* 7.5* 7.8* 9.9*  HCT 28.8* 28.3* 24.6* 25.7* 32.3*  MCV 95.0 101.8* 99.2 98.8 94.2  PLT 224 210 190 180 648   Basic Metabolic Panel: Recent Labs  Lab 08/04/18 0500 08/05/18 0544 08/06/18 0615 08/07/18 0500 08/08/18 0545  NA  141 136 136 138 137  K 4.0 3.9 3.4* 3.3* 2.6*  CL 106 98 98 102 103  CO2 '27 26 30 30 27  ' GLUCOSE 71 247* 152* 80 75  BUN 28* 26* 22* 15 13  CREATININE 0.60* 0.92 0.80 0.76 0.62  CALCIUM 8.7* 8.3* 8.2* 8.5* 8.4*   GFR: Estimated Creatinine Clearance: 142.8 mL/min (by C-G formula based on SCr of 0.62 mg/dL). Liver Function Tests: Recent Labs  Lab 08/03/18 0846 08/04/18 0500 08/05/18 0544 08/06/18 0615 08/07/18 0500  AST 34 38 47* 39 37  ALT 27 27 33 27 24  ALKPHOS 60 54 77 68 64  BILITOT 0.8 0.7 0.6 0.6 0.8  PROT 5.5* 5.5* 5.5* 5.1* 5.2*  ALBUMIN 2.8* 2.7* 2.7* 2.3* 2.3*   No results for input(s): LIPASE, AMYLASE in the last 168 hours. No results for input(s): AMMONIA in the last 168 hours. Coagulation Profile: No results for input(s): INR, PROTIME in the last 168 hours. Cardiac Enzymes: No results for input(s): CKTOTAL, CKMB, CKMBINDEX, TROPONINI in the last 168 hours. BNP (last 3 results) No results for input(s): PROBNP in the last 8760 hours. HbA1C: No results for input(s): HGBA1C in the last 72 hours. CBG: Recent Labs  Lab 08/07/18 1633 08/07/18 2151 08/08/18 0718 08/08/18 0809 08/08/18 1127  GLUCAP 88 81 52* 88 81   Lipid Profile: No results for input(s): CHOL, HDL, LDLCALC, TRIG, CHOLHDL, LDLDIRECT in the last 72 hours. Thyroid Function Tests: No results for input(s): TSH, T4TOTAL, FREET4, T3FREE, THYROIDAB in the last 72 hours. Anemia Panel: Recent Labs  08/07/18 1815  FERRITIN 1,415*  TIBC 245*  IRON 37*   Sepsis Labs: Recent Labs  Lab 08/05/18 1141 08/05/18 1812 08/06/18 1802  LATICACIDVEN 5.0* 3.0* 2.3*    Recent Results (from the past 240 hour(s))  Culture, blood (Routine X 2) w Reflex to ID Panel     Status: None (Preliminary result)   Collection Time: 08/05/18  9:40 AM  Result Value Ref Range Status   Specimen Description BLOOD RIGHT ANTECUBITAL  Final   Special Requests   Final    BOTTLES DRAWN AEROBIC ONLY Blood Culture adequate  volume   Culture   Final    NO GROWTH 3 DAYS Performed at [facility], [place] [address] [place], [place] [address]    Report Status PENDING  Incomplete  Culture, blood (Routine X 2) w Reflex to ID Panel     Status: None (Preliminary result)   Collection Time: 08/05/18  9:47 AM  Result Value Ref Range Status   Specimen Description BLOOD RIGHT ANTECUBITAL  Final   Special Requests   Final    BOTTLES DRAWN AEROBIC ONLY Blood Culture adequate volume   Culture   Final    NO GROWTH 3 DAYS Performed at [facility], [place] [address], [place], [place] [address]    Report Status PENDING  Incomplete         Radiology Studies: Dg Ugi W/water Sol Cm  Result Date: 08/07/2018 CLINICAL DATA:  Gastric ulcer.  Pneumoperitoneum.  Rule out source. EXAM: WATER SOLUBLE UPPER GI SERIES TECHNIQUE: Single-column upper GI series was performed using water soluble contrast. CONTRAST:  363m OMNIPAQUE IOHEXOL 300 MG/ML  SOLN COMPARISON:  CT abdomen 08/06/2018 FLUOROSCOPY TIME:  Fluoroscopy Time:  2 minutes 42 second Radiation Exposure Index (if provided by the fluoroscopic device): Number of Acquired Spot Images: 0 FINDINGS: Patient with paraplegic.  Limited positioning was possible Mild esophageal dilatation and dysmotility. No esophageal stricture or mass. No ulcer or esophageal leak Gastric mucosa is normal. The fundus is normal. Review of recent CT revealed some stranding lateral to the greater curve of the stomach. Gastric mucosa normal in this area without perforation. No ulcer or mass in the stomach. Limited evaluation of the body of the stomach. Antrum and duodenal bulb normal. Anterior wall of stomach limited evaluation due to difficulty with patient positioning IMPRESSION: Negative for perforation.  No gastric ulcer or mass. Exam somewhat limited by lack of mobility. Anterior wall the stomach not well evaluated. Electronically Signed   By: CFranchot GalloM.D.   On: 08/07/2018 10:09         Scheduled Meds: . sodium chloride   Intravenous Once  . Chlorhexidine Gluconate Cloth  6 each Topical Q0600  . clonazePAM  0.5 mg Oral BID  . gabapentin  300 mg Oral TID  . Gerhardt's butt cream   Topical BID  . Influenza vac split quadrivalent PF  0.5 mL Intramuscular Tomorrow-1000  . insulin aspart  0-15 Units Subcutaneous TID WC  . insulin aspart  0-5 Units Subcutaneous QHS  . insulin glargine  20 Units Subcutaneous QHS  . lipase/protease/amylase  36,000 Units Oral TID AC  . magic mouthwash w/lidocaine  5 mL Oral TID  . methocarbamol  750 mg Oral QID  . midodrine  5 mg Oral BID WC  . multivitamin with minerals  1 tablet Oral Daily  . saccharomyces boulardii  250 mg Oral BID  . sodium chloride flush  10-40 mL Intracatheter Q12H   Continuous  Infusions: . sodium chloride Stopped (08/06/18 1613)  . bivalirudin (ANGIOMAX) infusion 0.5 mg/mL (Non-ACS indications) 0.1 mg/kg/hr (08/08/18 1130)  . ceFEPime (MAXIPIME) IV 1 g (08/08/18 2341)  . famotidine (PEPCID) IV 40 mg (08/08/18 1208)     LOS: 26 days   Domenic Polite, MD [facility] Page via [email or web address]  If 7PM-7AM, please contact night-coverage [email or web address] Password [facility] 08/08/2018, 12:58 PM

## 2018-08-08 NOTE — [place] FL2 (Addendum)
[place] MEDICAID FL2 LEVEL OF CARE SCREENING TOOL     IDENTIFICATION  Patient Name: [name] [name] Birthdate: June 07, 1966 Sex: male Admission Date (Current Location): 06/06/2018  [facility] and [place] Number:  Producer, television/film/video and Address:  [facility], [address] [place], [place] [address]      Provider Number: [id number]  Attending Physician Name and Address:  Zannie Cove, MD  Relative Name and Phone Number:  Sylvain Hasten; father; [phone]    Current Level of Care: Hospital Recommended Level of Care: Skilled Nursing Facility Prior Approval Number:    Date Approved/Denied:   PASRR Number:   [id number]  Discharge Plan: SNF    Current Diagnoses: Patient Active Problem List   Diagnosis Date Noted  . G6PD deficiency 07/27/2018  . G6PD deficiency anemia (HCC) 07/27/2018  . Ascending aortic aneurysm (HCC) 07/27/2018  . Adjustment disorder with mixed anxiety and depressed mood 07/15/2018  . Tachycardia-bradycardia syndrome (HCC)   . Spondylosis, cervical, with myelopathy   . Labile blood pressure   . Orthostatic hypotension   . Fever 06/08/2018  . Cord compression (HCC) 06/07/2018  . Recurrent pulmonary embolism (HCC) 06/07/2018  . Loose stools   . Acute idiopathic gout of left foot   . Hypoalbuminemia due to protein-calorie malnutrition (HCC)   . PTSD (post-traumatic stress disorder)   . Sleep disorder   . Anasarca   . Thrombocytopenia (HCC)   . Morbid obesity (HCC)   . Acute blood loss anemia   . Benign essential HTN   . Cervical myelopathy (HCC) 04/11/2018  . Pressure injury of skin 04/10/2018  . Acute deep vein thrombosis (DVT) of lower extremity (HCC)   . Bleeding   . Pulmonary embolism (HCC)bilateral, failed heparin IVC filter placed7/16 03/29/2018  . Preop cardiovascular exam   . Acute deep vein thrombosis (DVT) of right lower extremity (HCC)   . Myelopathy (HCC) 03/22/2018    Orientation RESPIRATION BLADDER  Height & Weight     Self, Time, Situation, Place  Normal Incontinent, External catheter Weight: 281 lb 4.9 oz (127.6 kg) Height:  5\' 9"  (175.3 cm)  BEHAVIORAL SYMPTOMS/MOOD NEUROLOGICAL BOWEL NUTRITION STATUS      Incontinent Diet(see discharge summary)  AMBULATORY STATUS COMMUNICATION OF NEEDS Skin   Extensive Assist(has soft collar) Verbally PU Stage and Appropriate Care, Other (Comment)(MASD on buttocks, groin, thich, perineum, coccyx on foam; closed surgical incision on neck)   PU Stage 2 Dressing: (Stage II on ischial tuberosity with foam dressing PRN)                   Personal Care Assistance Level of Assistance  Bathing, Feeding, Dressing Bathing Assistance: Maximum assistance Feeding assistance: Limited assistance Dressing Assistance: Maximum assistance     Functional Limitations Info  Sight, Hearing, Speech Sight Info: Adequate Hearing Info: Adequate Speech Info: Adequate    SPECIAL CARE FACTORS FREQUENCY  PT (By licensed PT), OT (By licensed OT)     PT Frequency: 5x week OT Frequency: 5x week            Contractures Contractures Info: Not present    Additional Factors Info  Code Status, Allergies, Psychotropic, Insulin Sliding Scale Code Status Info: Full Code Allergies Info: NSAIDS, SULFA ANTIBIOTICS, PROTONIX PANTOPRAZOLE SODIUM  Psychotropic Info: clonazePAM (KLONOPIN) tablet 0.5 mg 2x daily Insulin Sliding Scale Info: insulin aspart (novoLOG) injection 0-15 Units 3x daily with meals; insulin aspart (novoLOG) injection 0-5 Units daily at bedtime; insulin glargine (LANTUS) injection 20 Units  daily at bedtime       Current Medications (08/08/2018):  This is the current hospital active medication list Current Facility-Administered Medications  Medication Dose Route Frequency Provider Last Rate Last Dose  . 0.9 %  sodium chloride infusion (Manually program via Guardrails IV Fluids)   Intravenous Once Kathlen Mody, MD      . 0.9 %  sodium chloride  infusion   Intravenous PRN Osvaldo Shipper, MD   Stopped at 08/06/18 1613  . acetaminophen (TYLENOL) tablet 650 mg  650 mg Oral Q4H PRN Edsel Petrin, DO   650 mg at 08/04/18 2025   Or  . acetaminophen (TYLENOL) suppository 650 mg  650 mg Rectal Q4H PRN Edsel Petrin, DO   650 mg at 08/07/18 1610  . alum & mag hydroxide-simeth (MAALOX/MYLANTA) 200-200-20 MG/5ML suspension 30 mL  30 mL Oral Q6H PRN Edsel Petrin, DO   30 mL at 06/26/18 2203  . bivalirudin (ANGIOMAX) 250 mg in sodium chloride 0.9 % 500 mL (0.5 mg/mL) infusion  0.1 mg/kg/hr Intravenous Continuous Armandina Stammer, RPH 25.5 mL/hr at 08/08/18 1130 0.1 mg/kg/hr at 08/08/18 1130  . ceFEPIme (MAXIPIME) 1 g in sodium chloride 0.9 % 100 mL IVPB  1 g Intravenous Q8H Nimer, Barton Fanny, RPH 200 mL/hr at 08/08/18 0608 1 g at 08/08/18 9604  . Chlorhexidine Gluconate Cloth 2 % PADS 6 each  6 each Topical Q0600 Donalee Citrin, MD   6 each at 08/07/18 0549  . clonazePAM (KLONOPIN) tablet 0.5 mg  0.5 mg Oral BID Osvaldo Shipper, MD   0.5 mg at 08/08/18 1027  . famotidine (PEPCID) 40 mg in sodium chloride 0.9 % 50 mL IVPB  40 mg Intravenous Q12H Josph Macho, MD 108 mL/hr at 08/08/18 1208 40 mg at 08/08/18 1208  . gabapentin (NEURONTIN) capsule 300 mg  300 mg Oral TID Edsel Petrin, DO   300 mg at 08/08/18 1027  . Gerhardt's butt cream   Topical BID Donalee Citrin, MD      . HYDROcodone-homatropine [facility]) 5-1.5 MG/5ML syrup 5 mL  5 mL Oral Q4H PRN Edsel Petrin, DO   5 mL at 08/07/18 0853  . Influenza vac split quadrivalent PF (FLUARIX) injection 0.5 mL  0.5 mL Intramuscular Tomorrow-1000 Mikhail, Maryann, DO      . insulin aspart (novoLOG) injection 0-15 Units  0-15 Units Subcutaneous TID WC Osvaldo Shipper, MD   2 Units at 08/06/18 1327  . insulin aspart (novoLOG) injection 0-5 Units  0-5 Units Subcutaneous QHS Osvaldo Shipper, MD   3 Units at 08/02/18 2258  . insulin glargine (LANTUS) injection 20 Units  20 Units Subcutaneous QHS  Zannie Cove, MD      . ipratropium-albuterol (DUONEB) 0.5-2.5 (3) MG/3ML nebulizer solution 3 mL  3 mL Nebulization Q6H PRN Kathlen Mody, MD      . lipase/protease/amylase (CREON) capsule 36,000 Units  36,000 Units Oral TID AC Josph Macho, MD   36,000 Units at 08/08/18 1207  . loperamide (IMODIUM) capsule 2 mg  2 mg Oral TID PRN Osvaldo Shipper, MD   2 mg at 08/07/18 2308  . magic mouthwash w/lidocaine  5 mL Oral TID Meyran, Tiana Loft, NP   5 mL at 08/08/18 1024  . methocarbamol (ROBAXIN) tablet 750 mg  750 mg Oral QID Edsel Petrin, DO   750 mg at 08/08/18 1028  . midodrine (PROAMATINE) tablet 5 mg  5 mg Oral BID WC Osvaldo Shipper, MD   5 mg at 08/08/18 1027  . morphine  2 MG/ML injection 2 mg  2 mg Intravenous Q4H PRN Edsel PetrinMikhail, Maryann, DO   2 mg at 07/21/18 2245  . multivitamin with minerals tablet 1 tablet  1 tablet Oral Daily Osvaldo ShipperKrishnan, Gokul, MD   1 tablet at 08/08/18 1026  . ondansetron (ZOFRAN) tablet 4 mg  4 mg Oral Q6H PRN Edsel PetrinMikhail, Maryann, DO       Or  . ondansetron [facility](ZOFRAN) injection 4 mg  4 mg Intravenous Q6H PRN Edsel PetrinMikhail, Maryann, DO      . oxyCODONE (Oxy IR/ROXICODONE) immediate release tablet 15 mg  15 mg Oral Q3H PRN Edsel PetrinMikhail, Maryann, DO   15 mg at 08/05/18 1619  . saccharomyces boulardii (FLORASTOR) capsule 250 mg  250 mg Oral BID Josph MachoEnnever, Peter R, MD   250 mg at 08/08/18 1026  . sodium chloride flush (NS) 0.9 % injection 10-40 mL  10-40 mL Intracatheter Q12H Regalado, Belkys A, MD   10 mL at 08/08/18 1118  . sodium chloride flush (NS) 0.9 % injection 10-40 mL  10-40 mL Intracatheter PRN Regalado, Belkys A, MD      . sodium chloride flush (NS) 0.9 % injection 3 mL  3 mL Intravenous PRN Edsel PetrinMikhail, Maryann, DO   3 mL at 08/07/18 [id number]     Discharge Medications: Please see discharge summary for a list of discharge medications.  Relevant Imaging Results:  Relevant Lab Results:   Additional Information  SS[id number]  Jaylanni Eltringham H R[place], [place]

## 2018-08-08 NOTE — Progress Notes (Addendum)
[facility] Progress Note  29 Days Post-Op  Subjective: CC:  Wants to eat. Denies abdominal pain. Again denies a history of PUD/gastric ulcers and reports a history of normal colonoscopy. Reports he is having flatus and loose stools. Getting up to the chair with assistance from the staff.  Objective: Vital signs in last 24 hours: Temp:  [97.7 F (36.5 C)-99.9 F (37.7 C)] 99.2 F (37.3 C) (11/26 0702) Pulse Rate:  [84-101] 89 (11/26 0702) Resp:  [0-29] 22 (11/26 0702) BP: (100-120)/(61-87) 103/72 (11/26 0702) SpO2:  [89 %-100 %] 97 % (11/26 0702) Last BM Date: 08/08/18  Intake/Output from previous day: 11/25 0701 - 11/26 0700 In: 3825.7 [P.O.:1200; I.V.:884.3; Blood:1241.3; IV Piggyback:500] Out: 700 [Urine:700] Intake/Output this shift: No intake/output data recorded.  PE: Gen:  Alert, NAD, cooperative  Card:  Regular rate and rhythm, pedal pulses 2+ BL, peripheral edema present Pulm:  Normal effort Abd: Soft, non-tender, +BS, no organomegaly  Skin: warm and dry, no rashes  Neuro: grip strength 1/5 LUE, 2/5 RUE, able to move RLE small amount, was unable to move LLE for me, sensation in tact BLE.  Psych: A&Ox3, flat affect   Lab Results:  Recent Labs    08/06/18 0615 08/07/18 0500  WBC 8.0 7.1  HGB 7.5* 7.8*  HCT 24.6* 25.7*  PLT 190 180   BMET Recent Labs    08/07/18 0500 08/08/18 0545  NA 138 137  K 3.3* 2.6*  CL 102 103  CO2 30 27  GLUCOSE 80 75  BUN 15 13  CREATININE 0.76 0.62  CALCIUM 8.5* 8.4*   PT/INR No results for input(s): LABPROT, INR in the last 72 hours. CMP     Component Value Date/Time   NA 137 08/08/2018 0545   K 2.6 (LL) 08/08/2018 0545   CL 103 08/08/2018 0545   CO2 27 08/08/2018 0545   GLUCOSE 75 08/08/2018 0545   BUN 13 08/08/2018 0545   CREATININE 0.62 08/08/2018 0545   CALCIUM 8.4 (L) 08/08/2018 0545   PROT 5.2 (L) 08/07/2018 0500   ALBUMIN 2.3 (L) 08/07/2018 0500   AST 37 08/07/2018 0500   ALT 24  08/07/2018 0500   ALKPHOS 64 08/07/2018 0500   BILITOT 0.8 08/07/2018 0500   GFRNONAA >60 08/08/2018 0545   GFRAA >60 08/08/2018 0545   Lipase  No results found for: LIPASE     Studies/Results: Dg Ugi W/water Sol Cm  Result Date: 08/07/2018 CLINICAL DATA:  Gastric ulcer.  Pneumoperitoneum.  Rule out source. EXAM: WATER SOLUBLE UPPER GI SERIES TECHNIQUE: Single-column upper GI series was performed using water soluble contrast. CONTRAST:  300mL OMNIPAQUE IOHEXOL 300 MG/ML  SOLN COMPARISON:  CT abdomen 08/06/2018 FLUOROSCOPY TIME:  Fluoroscopy Time:  2 minutes 42 second Radiation Exposure Index (if provided by the fluoroscopic device): Number of Acquired Spot Images: 0 FINDINGS: Patient with paraplegic.  Limited positioning was possible Mild esophageal dilatation and dysmotility. No esophageal stricture or mass. No ulcer or esophageal leak Gastric mucosa is normal. The fundus is normal. Review of recent CT revealed some stranding lateral to the greater curve of the stomach. Gastric mucosa normal in this area without perforation. No ulcer or mass in the stomach. Limited evaluation of the body of the stomach. Antrum and duodenal bulb normal. Anterior wall of stomach limited evaluation due to difficulty with patient positioning IMPRESSION: Negative for perforation.  No gastric ulcer or mass. Exam somewhat limited by lack of mobility. Anterior wall the stomach not well evaluated. Electronically Signed  By: Marlan Palau M.D.   On: 08/07/2018 10:09    Anti-infectives: Anti-infectives (From admission, onward)   Start     Dose/Rate Route Frequency Ordered Stop   08/05/18 1415  vancomycin (VANCOCIN) IVPB 1000 mg/200 mL premix     1,000 mg 200 mL/hr over 60 Minutes Intravenous Every 8 hours 08/05/18 1410     08/05/18 1415  ceFEPIme (MAXIPIME) 1 g in sodium chloride 0.9 % 100 mL IVPB     1 g 200 mL/hr over 30 Minutes Intravenous Every 8 hours 08/05/18 1410     08/04/18 0645  levofloxacin (LEVAQUIN)  IVPB 500 mg  Status:  Discontinued     500 mg 100 mL/hr over 60 Minutes Intravenous Every 24 hours 08/04/18 0642 08/05/18 1316   07/25/18 1700  fluconazole (DIFLUCAN) IVPB 400 mg  Status:  Discontinued     400 mg 100 mL/hr over 120 Minutes Intravenous Every 24 hours 07/25/18 1633 07/27/18 1415   07/12/18 2100  ceFAZolin (ANCEF) IVPB 2g/100 mL premix  Status:  Discontinued     2 g 200 mL/hr over 30 Minutes Intravenous Every 8 hours 07/12/18 1555 07/15/18 1050   07/10/18 2229  bacitracin 50,000 Units in sodium chloride 0.9 % 500 mL irrigation  Status:  Discontinued       As needed 07/10/18 2230 07/10/18 2300   07/07/18 1133  bacitracin 50,000 Units in sodium chloride 0.9 % 500 mL irrigation  Status:  Discontinued       As needed 07/07/18 1133 07/07/18 1357   07/06/18 0900  ceFAZolin (ANCEF) IVPB 2g/100 mL premix  Status:  Discontinued     2 g 200 mL/hr over 30 Minutes Intravenous Every 8 hours 07/06/18 0810 07/12/18 1555   07/01/18 2240  bacitracin 50,000 Units in sodium chloride 0.9 % 500 mL irrigation  Status:  Discontinued       As needed 07/01/18 2240 07/01/18 2324   06/30/18 1330  doxycycline (VIBRA-TABS) tablet 100 mg  Status:  Discontinued     100 mg Oral Every 12 hours 06/30/18 1329 07/12/18 1429   06/27/18 1000  fluconazole (DIFLUCAN) tablet 200 mg  Status:  Discontinued     200 mg Oral Daily 06/27/18 0725 07/04/18 0720   06/22/18 2315  ceFAZolin (ANCEF) IVPB 2g/100 mL premix     2 g 200 mL/hr over 30 Minutes Intravenous Every 8 hours 06/22/18 2314 06/27/18 1519   06/22/18 1828  bacitracin 50,000 Units in sodium chloride 0.9 % 500 mL irrigation  Status:  Discontinued       As needed 06/22/18 1828 06/22/18 1915   06/22/18 1700  ceFAZolin (ANCEF) 3 g in dextrose 5 % 50 mL IVPB  Status:  Discontinued     3 g 100 mL/hr over 30 Minutes Intravenous  Once 06/22/18 1647 06/22/18 2101   06/14/18 2100  ceFAZolin (ANCEF) IVPB 2g/100 mL premix     2 g 200 mL/hr over 30 Minutes Intravenous  Every 8 hours 06/14/18 1712 06/17/18 0800   06/14/18 1414  bacitracin 50,000 Units in sodium chloride 0.9 % 500 mL irrigation  Status:  Discontinued       As needed 06/14/18 1414 06/14/18 1647   06/14/18 1215  ceFAZolin (ANCEF) 3 g in dextrose 5 % 50 mL IVPB     3 g 160 mL/hr over 30 Minutes Intravenous To [facility] 06/14/18 1202 06/14/18 2225   06/14/18 1200  ceFAZolin (ANCEF) IVPB 2g/100 mL premix  Status:  Discontinued     2  g 200 mL/hr over 30 Minutes Intravenous On call to O.R. 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  Status post cervical decompressive surgery on 06/14/2018 Status post evacuation of large hematoma and placement of Hemovac drain on 06/22/2018. Thrombocytopenia/coagulopathy Acute pulmonary embolism/DVT S/p IVC filter 03/28/18 Hyperglycemia  Adjustment disorder HTN  Pneumoperitoneum, unknown etiology  - afebrile (TMAX 99), heart rate and blood pressure are WNL - CBC pending but has never had a leukocytosis - CT Abd/Pelvis 11/24 - Soft tissue stranding adjacent to the greater curvature of the body of the stomach  - UGI 11/25 negative for gastric leak - continues to have flatus and loose BMs  - tolerating liquids, advance to SOFT diet. - would hold off on resuming anticoagulation until hgb/hct more stable and patient reliably tolerating a more solid diet.  - continue Pepcid   FEN: clear liquids (+yahoo from his room), advance to SOFT ID: [email or web address] 11/23 >>  VTE: SCD's  Foley: condom cath in place   LOS: 62 days    Hosie Spangle, [facility] Pager: [phone]

## 2018-08-08 NOTE — Progress Notes (Signed)
Pharmacy Antibiotic Note  [name] [name] is a 52 y.o. male admitted on 06/06/2018 with possible sepsis potentially secondary to pneumonia.  Pharmacy has been consulted for vancomycin and cefepime dosing.  Day #4 of abx. Afebrile, WBC wnl.  Plan: Stop vancomycin Continue cefepime 1g IV Q8h Monitor clinical picture, renal function, VT prn F/U C&S, abx deescalation / LOT  Height: 5\' 9"  (175.3 cm) Weight: 281 lb 4.9 oz (127.6 kg) IBW/kg (Calculated) : 70.7  Temp (24hrs), Avg:98.4 F (36.9 C), Min:97.7 F (36.5 C), Max:99.9 F (37.7 C)  Recent Labs  Lab 08/04/18 0500 08/05/18 0544 08/05/18 1141 08/05/18 1812 08/06/18 0615 08/06/18 1802 08/07/18 0500 08/08/18 0545 08/08/18 0955  WBC 8.2 8.9  --   --  8.0  --  7.1  --  9.4  CREATININE 0.60* 0.92  --   --  0.80  --  0.76 0.62  --   LATICACIDVEN  --   --  5.0* 3.0*  --  2.3*  --   --   --     Estimated Creatinine Clearance: 142.8 mL/min (by C-G formula based on SCr of 0.62 mg/dL).    Allergies  Allergen Reactions  . Nsaids Other (See Comments)    G6PD DEFICIENCY  . Sulfa Antibiotics Other (See Comments)    G6PD DEFICIENCY  . Protonix [Pantoprazole Sodium] Diarrhea    Severe diarrhea, Upset stomach    Antimicrobials this admission: Vancomycin 11/23 >> 11/26 Cefepime 11/23 >>  Levofloxacin 11/22>11/23  Microbiology results: 11/23 BCx: sent 11/08 MRSA PCR: negative  Thank you for allowing pharmacy to be a part of this patient's care.  Enzo BiNathan Idaliz Tinkle, PharmD, BCPS Clinical Pharmacist Phone number [phone] 08/08/2018 1:00 PM

## 2018-08-08 NOTE — Progress Notes (Signed)
Thankfully, there is no leak that was found on the upper GI.  As such, it does not look like there is any plan surgical intervention.  He is on bivalirudin right now.  I would keep him on this until we know for sure that there is no surgery planned.  Once he is able to eat regular meals, then I think we can switch him over to an oral agent.  I realize that this will involve drawn his blood several times a day.  I hate that part but yet I just do not want to switch him back over to something oral and then find out that he is going to need to have surgery.  He got 2 units of blood.  There is no CBC back yet this morning.  His iron studies do show iron deficiency.  I will give him a dose of IV iron today.  His LDH is trending downward.  He still is somewhat depressed.  I can understand this.  I do not know if he is in out of bed or not.  It would be nice to try to get him into a chair if possible.  Hopefully, we will be able to switch him over to an oral anticoagulant once we know that he is able to eat and does not have any issues with this abdominal free air.  Christin BachPete Darene Nappi, MD  Psalm 27:14

## 2018-08-08 NOTE — Progress Notes (Signed)
(  Late entry)  Hypoglycemic Event  CBG: 52 @ 0728   Treatment: D50 IV 25 mL  Symptoms: None  Follow-up CBG: Time: 0806 CBG Result: 88  Possible Reasons for Event: Inadequate meal intake; medication regimen  Comments/MD notified:     Truddie CrumbleMack, Yossi Hinchman K

## 2018-08-08 NOTE — Progress Notes (Signed)
Physical Therapy Treatment Patient Details Name: [name] [name] MRN: [id number] DOB: 09-30-1965 Today's Date: 08/08/2018    History of Present Illness 52 y.o. male admitted on 06/06/18 for bil LE weakness and muscle spasm after multiple falls (with resultant R knee meniscus tear placed in bledsoe brace) following a recent ACDF at C3 in July 2019 (complicated by PE (s/p IVC filter), CIR, and then SNF placement before finally returning home with his parents).  In the ED MRI of c-spine which showed post operative changes and cord edema and possibility of ischemia.  His R knee was aspirated on 06/09/18.  S/p posterior cervical decompression and fusion on 06/14/18. Entire acute course complicated by hypotension in standing (thought to be autonomic) and by finding of multiple LE DVTs (already has an IVC filter in place, but per MDs notes at risk for blocking the filter with these clots).  Pt with post op issues with incisional bleeding s/p 2 evacuations of hematomas and placement of hemovac (06/22/18 and 07/01/18) as well as multiple units of plasma.  Cardiology consulted 07/05/18 due to bradycardia thought to be from his newly started blood thinner.  Pt with another re-accumulation of epidural hematoma with increased L sided weakness and cord compression with emergent evacuation surgery 10/25 & 07/10/18.  Pt also needing more platelets post op.  Bone marrow bx 07/12/18.  Pt found to have submassive PE on 07/27/18 and re-started on anticoagulation. PMHx of HTN, gout, anemia, DDD (lumbar)    PT Comments    Bed level ROM completed as well as gentle neck ROM and strengthening exercises.  Pt's ischial wounds are still progressing so, OOB to chair is not a good idea without a tilt in space WC with an appropriate pressure cushion.  We were able to sit EOB with two person assist and work on sitting balance, upright head and neck posture, and neck extension/scapular retraction in sitting.  VSS throughout.  Pt with large  BM, so assisted back to bed and RN applying rectal pouch.  PT will continue to follow acutely for safe mobility progression   Follow Up Recommendations  SNF(post acute rehab)     Equipment Recommendations  Hospital bed;Wheelchair (measurements PT);Wheelchair cushion (measurements PT);Other (comment)(22x20 tilt in space power chair, roho cushion, hoyer lift)    Recommendations for Other Services   NA     Precautions / Restrictions Precautions Precautions: Fall;Cervical;Other (comment) Precaution Comments: Several bed sores on bottom.   Required Braces or Orthoses: Cervical Brace Cervical Brace: Soft collar    Mobility  Bed Mobility Overal bed mobility: Needs Assistance Bed Mobility: Rolling Rolling: +2 for physical assistance;Total assist Sidelying to sit: +2 for physical assistance;Total assist;HOB elevated     Sit to sidelying: +2 for physical assistance;Total assist General bed mobility comments: Two person total assist to move to EOB from side lying with HOB elevated to sitting EOB.    Transfers                 General transfer comment: NT as pt's bottom pressure wounds are not getting better and it sitting for hours in the recliner chair with a cushion would not help.          Balance Overall balance assessment: Needs assistance Sitting-balance support: Feet unsupported;Bilateral upper extremity supported;Single extremity supported;No upper extremity supported Sitting balance-Leahy Scale: Zero Sitting balance - Comments: max assist EOB.  Sat >5 mins working on scapular retractions, upright head posture,  Postural control: Other (comment)(poor trunk control)  Cognition Arousal/Alertness: Awake/alert Behavior During Therapy: WFL for tasks assessed/performed Overall Cognitive Status: Within Functional Limits for tasks assessed                                        Exercises General Exercises -  Upper Extremity Shoulder Flexion: PROM;Left;20 reps;Supine;AAROM;Right Elbow Flexion: PROM;AROM;Left;Right;10 reps Wrist Extension: PROM;AAROM;Left;Right;10 reps General Exercises - Lower Extremity Ankle Circles/Pumps: PROM;AROM;Left;Right;10 reps Heel Slides: AAROM;PROM;Right;Left;10 reps Hip ABduction/ADduction: AAROM;PROM;Right;Left;10 reps Other Exercises Other Exercises: heels checked for signs of pressure, no signs, however, LE edema still present with TEDs applied (pulled up to decrease rolling at the top of knee high teds) p's left heel cord is getting tender to end range stretch, so gentle cross friction massage applied during stretch.   Other Exercises: Head and neck gentle AAROM rotation bil, sidebend bil with upper trap massage.   Other Exercises: Educated on chin tuck and scapular retractions, to preform throughout the day while in the bed as well as AROM for neck rotation into minimal discomfort with cues that if neck exercises cause any tingling or burning in his arms to stop exercises.      General Comments General comments (skin integrity, edema, etc.): Talked to pt about requesting to be turned every two hours from side to side, not sitting straight up in the bed to lay more flat as well to take pressure off of his ischiums.  RN applied rectal pouch to help control his stool.       Pertinent Vitals/Pain Pain Assessment: Faces Faces Pain Scale: Hurts a little bit Pain Location: posterior neck Pain Descriptors / Indicators: Aching Pain Intervention(s): Limited activity within patient's tolerance;Monitored during session;Repositioned           PT Goals (current goals can now be found in the care plan section) Acute Rehab PT Goals Patient Stated Goal: to get his medical conditions figured out, heal up his bottom    Frequency    Min 2X/week      PT Plan Current plan remains appropriate       AM-PAC PT "6 Clicks" Mobility   Outcome Measure  Help needed  turning from your back to your side while in a flat bed without using bedrails?: Total Help needed moving from lying on your back to sitting on the side of a flat bed without using bedrails?: Total Help needed moving to and from a bed to a chair (including a wheelchair)?: Total Help needed standing up from a chair using your arms (e.g., wheelchair or bedside chair)?: Total Help needed to walk in hospital room?: Total Help needed climbing 3-5 steps with a railing? : Total 6 Click Score: 6    End of Session Equipment Utilized During Treatment: Cervical collar Activity Tolerance: Patient tolerated treatment well Patient left: in bed;with call bell/phone within reach;with nursing/sitter in room(RNs in room cleaning pt up from BM and applying rectal pouch) Nurse Communication: Mobility status PT Visit Diagnosis: Other abnormalities of gait and mobility (R26.89);Other symptoms and signs involving the nervous system (R29.898);Muscle weakness (generalized) (M62.81)     Time: 1610-9604 PT Time Calculation (min) (ACUTE ONLY): 48 min  Charges:  $Therapeutic Exercise: 23-37 mins $Therapeutic Activity: 8-22 mins                    Lealer Marsland B. Jesica Goheen, PT, DPT  Acute Rehabilitation [phone] pager #[phone] office   08/08/2018, 4:36  PM   

## 2018-08-08 NOTE — Progress Notes (Signed)
Nutrition Follow-up  DOCUMENTATION CODES:   Morbid obesity  INTERVENTION:  Provide Ensure Enlive po BID, each supplement provides 350 kcal and 20 grams of protein.  Encourage adequate PO intake.   NUTRITION DIAGNOSIS:   Increased nutrient needs related to wound healing, post-op healing as evidenced by estimated needs; ongoing  GOAL:   Patient will meet greater than or equal to 90% of their needs; progressing  MONITOR:   PO intake, Supplement acceptance, Labs, Weight trends, Skin, I & O's  REASON FOR ASSESSMENT:   Low Braden    ASSESSMENT:   [name] [name] is a 52 y.o. male with history of hypertension who has had a recent ACDF at C3 C5 by Dr. Saintclair Halsted in July 4854 complicated by pulmonary embolism was placed on apixaban subsequently discharged to rehab and patient had moved to [place] area to [place] and has been living with his parents.  Had gone to ER around August 27 for complaints of increasing weakness and at the time was found to be febrile and was admitted for possible pneumonia.  He started antibiotic as per the ER physician with whom I discussed.  Patient was eventually discharged to rehab and patient was discharged from rehab last week.  Patient had come to [facility] for possible IVC filter placement for his known DVT and on reaching back home in [place] patient on trying to get out of the car started finding it difficult to walk and his legs gave away.  He also states he inadvertently turned his neck.  Following which patient has been having increasing weakness difficult to walk.  Prior to which patient was able to walk up the stairs.  Denies any incontinence of urine or bowel.  Neck pain has been chronic.  Denies any chest pain or shortness of breath.  9/27- s/p rt knee aspiration and injections 10/2- s/pposterior cervical decompressive laminectomy at C3, C4, C5 with removal of the spinous process and complete laminectomy and foraminotomies of the C3, C4, C5 nerve  roots;Posterior cervical fusion with lateral mass screws at C3-C4-C5 and pars screws at C2 lies in the globus ellipseLateral mass screw system;Posterior lateral arthrodesis C2-C5 utilizing locally harvested autograft mixed with vivigen 10/10- s/preexploration posterior cervical wound for evacuation of epidural hematoma 10/19- s/p cervical wound revision with evacuation of cervical epidural hematoma 10/25- s/preexploration of posterior surgical cervical wound for evacuation of epidural hematoma 10/29- s/p xxploration of posterior cervical wound and evacuation of epidural hematoma 10/30- s/p bone marrow biopsy  11/22  Pt with free intraabdominal air.  CT abd pelvis , shows possible stranding at the greater curvature of the stomach. GI series did not show obstruction.   Diet has been advanced to soft diet. RD to order nutritional supplements to aid in caloric and protein needs. Pt encouraged to eat his food at meals and to drink his supplements. Labs and medications reviewed.   Diet Order:   Diet Order            DIET SOFT Room service appropriate? Yes; Fluid consistency: Thin  Diet effective now              EDUCATION NEEDS:   Education needs have been addressed  Skin:  Skin Assessment: Skin Integrity Issues: Skin Integrity Issues:: Stage II, Incisions Stage II: R ischial tuberosity Incisions: neck  Last BM:  11/26  Height:   Ht Readings from Last 1 Encounters:  06/22/18 '5\' 9"'  (1.753 m)    Weight:   Wt Readings from Last 1  Encounters:  07/27/18 127.6 kg    Ideal Body Weight:  72.7 kg  BMI:  Body mass index is 41.54 kg/m.  Estimated Nutritional Needs:   Kcal:  2100-2300  Protein:  145-160 grams  Fluid:  > 2.1 L    [name], MS, RD, LDN Pager # [phone] After hours/ weekend pager # [phone]

## 2018-08-08 NOTE — Progress Notes (Signed)
ANTICOAGULATION CONSULT NOTE   Pharmacy Consult for Bivalirudin  Indication: pulmonary embolus and bilateral DVT   Patient Measurements: Height: 5\' 9"  (175.3 cm) Weight: 281 lb 4.9 oz (127.6 kg) IBW/kg (Calculated) : 70.7 Bivalirudin Dosing Weight: 127.6 kg  Vital Signs: Temp: 97.9 F (36.6 C) (11/26 0438) Temp Source: Oral (11/26 0438) BP: 102/61 (11/26 0438) Pulse Rate: 88 (11/26 0438)  Labs: Recent Labs    08/06/18 0615 08/07/18 0500 08/07/18 1401 08/07/18 1815 08/08/18 0545  HGB 7.5* 7.8*  --   --   --   HCT 24.6* 25.7*  --   --   --   PLT 190 180  --   --   --   APTT  --   --  >200* 59* 88*  CREATININE 0.80 0.76  --   --   --      Assessment:  52 yr old male with PE/DVT, Eliquis on hold while awaiting possible surgery. Heme/Onc requests bival over heparin. Initial aPTT is therapeutic. Please disregard aPTT > 200 from earlier today, this was drawn incorrectly and therefore is unreliable.   11/26 AM update: aPTT is above goal this AM, no issues per RN.  Goal of Therapy:  APTT 50-70 seconds Monitor platelets by anticoagulation protocol: Yes   Plan:  Decrease Bivalirudin to 0.11 mg/kg/hr 1100 aPTT  Abran DukeLedford, Honest Vanleer 08/08/2018 7:06 AM

## 2018-08-09 ENCOUNTER — Inpatient Hospital Stay (HOSPITAL_COMMUNITY)

## 2018-08-09 LAB — LACTATE DEHYDROGENASE: LDH: 509 U/L — AB (ref 98–192)

## 2018-08-09 LAB — CBC
HCT: 26.6 % — ABNORMAL LOW (ref 39.0–52.0)
HEMOGLOBIN: 8 g/dL — AB (ref 13.0–17.0)
MCH: 29.1 pg (ref 26.0–34.0)
MCHC: 30.1 g/dL (ref 30.0–36.0)
MCV: 96.7 fL (ref 80.0–100.0)
PLATELETS: 172 10*3/uL (ref 150–400)
RBC: 2.75 MIL/uL — AB (ref 4.22–5.81)
RDW: 20.3 % — ABNORMAL HIGH (ref 11.5–15.5)
WBC: 10.1 10*3/uL (ref 4.0–10.5)
nRBC: 3.4 % — ABNORMAL HIGH (ref 0.0–0.2)

## 2018-08-09 LAB — GLUCOSE, CAPILLARY
GLUCOSE-CAPILLARY: 191 mg/dL — AB (ref 70–99)
Glucose-Capillary: 115 mg/dL — ABNORMAL HIGH (ref 70–99)
Glucose-Capillary: 120 mg/dL — ABNORMAL HIGH (ref 70–99)
Glucose-Capillary: 138 mg/dL — ABNORMAL HIGH (ref 70–99)

## 2018-08-09 LAB — APTT: APTT: 66 s — AB (ref 24–36)

## 2018-08-09 MED ORDER — BUTALBITAL-APAP-CAFFEINE 50-325-40 MG PO TABS: 2.0000 | ORAL_TABLET | Freq: Once | ORAL | Status: DC

## 2018-08-09 MED ORDER — ACETAMINOPHEN 325 MG PO TABS
325.0000 mg | ORAL_TABLET | Freq: Once | ORAL | Status: AC
  Administered 2018-08-09: 325 mg via ORAL
  Filled 2018-08-09: qty 1

## 2018-08-09 MED ORDER — IPRATROPIUM-ALBUTEROL 0.5-2.5 (3) MG/3ML IN SOLN
3.0000 mL | Freq: Two times a day (BID) | RESPIRATORY_TRACT | Status: DC
  Administered 2018-08-09 – 2018-08-11 (×3): 3 mL via RESPIRATORY_TRACT
  Filled 2018-08-09 (×3): qty 3

## 2018-08-09 MED ORDER — IPRATROPIUM-ALBUTEROL 0.5-2.5 (3) MG/3ML IN SOLN
3.0000 mL | Freq: Two times a day (BID) | RESPIRATORY_TRACT | Status: DC
  Filled 2018-08-09: qty 3

## 2018-08-09 MED ORDER — SODIUM CHLORIDE 0.9 % IV BOLUS: 500.0000 mL | Freq: Once | INTRAVENOUS | Status: DC

## 2018-08-09 MED ORDER — METHOCARBAMOL 500 MG PO TABS
500.0000 mg | ORAL_TABLET | Freq: Four times a day (QID) | ORAL | Status: DC
  Administered 2018-08-09 – 2018-08-13 (×18): 500 mg via ORAL
  Filled 2018-08-09 (×18): qty 1

## 2018-08-09 MED ORDER — FAMOTIDINE 20 MG PO TABS
20.0000 mg | ORAL_TABLET | Freq: Every day | ORAL | Status: DC
  Administered 2018-08-10 – 2018-09-09 (×11): 20 mg via ORAL
  Filled 2018-08-09 (×18): qty 1

## 2018-08-09 NOTE — Progress Notes (Signed)
Patient incompliant with soft diet orders. Patient ordered and had dinner delivered by [employer].

## 2018-08-09 NOTE — Progress Notes (Signed)
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  Status post cervical decompressive surgery on 06/14/2018 Status post evacuation of large hematoma and placement of Hemovac drain on 06/22/2018. Thrombocytopenia/coagulopathy Acute pulmonary embolism/DVT S/p IVC filter 03/28/18 Hyperglycemia  Adjustment disorder HTN  Pneumoperitoneum, unknown etiology  - afebrile (TMAX 100.0), heart rate and blood pressure are WNL - no leukocytosis - CT Abd/Pelvis 11/24 -Soft tissue stranding adjacent tothe greater curvature of the body of the stomach - UGI 11/25 negative for gastric leak - continues to have flatus and BMs  - anticoagulation per oncolocy  - continue Pepcid   FEN: SOFT ID: Vanc Stopped 11/26; cefepime 11/23 >>  VTE: SCD's  Follow up: none needed  Plan: Pt is tolerating a diet without abdominal pain. His vitals are stable, he has no leukocytosis and he is having bowel function. No surgical intervention indicated. We will sign off. Please do not hesitate to page us with any further needs for this patient.    LOS: 63 days    Subjective: CC: pneumoperitoneum   No abdominal pain. He states his abdomen never hurt. Tolerating a soft diet. No nausea or vomiting. Having flatus and BM's. No family at bedside.   Objective: Vital signs in last 24 hours: Temp:  [98.4 F (36.9 C)-100 F (37.8 C)] 98.6 F (37 C) (11/27 0725) Pulse Rate:  [85-102] 96 (11/27 0800) Resp:  [9-27] 27 (11/27 0800) BP: (116-158)/(76-88) 129/85 (11/27 0800) SpO2:  [90 %-100 %] 95 % (11/27 0800) Last BM Date: 08/08/18  Intake/Output from previous day: 11/26 0701 - 11/27 0700 In: 1396.2 [P.O.:840; I.V.:556.2] Out: 1175 [Urine:1175] Intake/Output this shift: No intake/output data recorded.  PE: Gen:  Alert, NAD, pleasant,  cooperative Pulm:  Rate and effort normal Abd: Soft, NT/ND, +BS Skin: no rashes noted, warm and dry   Anti-infectives: Anti-infectives (From admission, onward)   Start     Dose/Rate Route Frequency Ordered Stop   08/05/18 1415  vancomycin (VANCOCIN) IVPB 1000 mg/200 mL premix  Status:  Discontinued     1,000 mg 200 mL/hr over 60 Minutes Intravenous Every 8 hours 08/05/18 1410 08/08/18 1257   08/05/18 1415  ceFEPIme (MAXIPIME) 1 g in sodium chloride 0.9 % 100 mL IVPB     1 g 200 mL/hr over 30 Minutes Intravenous Every 8 hours 08/05/18 1410     08/04/18 0645  levofloxacin (LEVAQUIN) IVPB 500 mg  Status:  Discontinued     500 mg 100 mL/hr over 60 Minutes Intravenous Every 24 hours 08/04/18 0642 08/05/18 1316   07/25/18 1700  fluconazole (DIFLUCAN) IVPB 400 mg  Status:  Discontinued     400 mg 100 mL/hr over 120 Minutes Intravenous Every 24 hours 07/25/18 1633 07/27/18 1415   07/12/18 2100  ceFAZolin (ANCEF) IVPB 2g/100 mL premix  Status:  Discontinued     2 g 200 mL/hr over 30 Minutes Intravenous Every 8 hours 07/12/18 1555 07/15/18 1050   07/10/18 2229  bacitracin 50,000 Units in sodium chloride 0.9 % 500 mL irrigation  Status:  Discontinued       As needed 07/10/18 2230 07/10/18 2300   07/07/18 1133  bacitracin 50,000 Units in sodium chloride 0.9 % 500 mL irrigation  Status:  Discontinued       As needed 07/07/18 1133 07/07/18 1357   07/06/18 0900  ceFAZolin (ANCEF) IVPB 2g/100 mL premix  Status:  Discontinued     2 g 200 mL/hr over 30 Minutes Intravenous Every 8 hours 07/06/18 0810 07/12/18 1555   07/01/18 2240  bacitracin 50,000 Units in sodium chloride 0.9 % 500 mL irrigation  Status:  Discontinued       As needed 07/01/18 2240 07/01/18 2324   06/30/18 1330  doxycycline (VIBRA-TABS) tablet 100 mg  Status:  Discontinued     100 mg Oral Every 12 hours 06/30/18 1329 07/12/18 1429   06/27/18 1000  fluconazole (DIFLUCAN) tablet 200 mg  Status:  Discontinued     200 mg Oral Daily  06/27/18 0725 07/04/18 0720   06/22/18 2315  ceFAZolin (ANCEF) IVPB 2g/100 mL premix     2 g 200 mL/hr over 30 Minutes Intravenous Every 8 hours 06/22/18 2314 06/27/18 1519   06/22/18 1828  bacitracin 50,000 Units in sodium chloride 0.9 % 500 mL irrigation  Status:  Discontinued       As needed 06/22/18 1828 06/22/18 1915   06/22/18 1700  ceFAZolin (ANCEF) 3 g in dextrose 5 % 50 mL IVPB  Status:  Discontinued     3 g 100 mL/hr over 30 Minutes Intravenous  Once 06/22/18 1647 06/22/18 2101   06/14/18 2100  ceFAZolin (ANCEF) IVPB 2g/100 mL premix     2 g 200 mL/hr over 30 Minutes Intravenous Every 8 hours 06/14/18 1712 06/17/18 0800   06/14/18 1414  bacitracin 50,000 Units in sodium chloride 0.9 % 500 mL irrigation  Status:  Discontinued       As needed 06/14/18 1414 06/14/18 1647   06/14/18 1215  ceFAZolin (ANCEF) 3 g in dextrose 5 % 50 mL IVPB     3 g 160 mL/hr over 30 Minutes Intravenous To [facility] 06/14/18 1202 06/14/18 2225   06/14/18 1200  ceFAZolin (ANCEF) IVPB 2g/100 mL premix  Status:  Discontinued     2 g 200 mL/hr over 30 Minutes Intravenous On call to O.R. 06/14/18 1157 06/14/18 2140      Lab Results:  Recent Labs    08/08/18 0955 08/09/18 0656  WBC 9.4 10.1  HGB 9.9* 8.0*  HCT 32.3* 26.6*  PLT 177 172   BMET Recent Labs    08/07/18 0500 08/08/18 0545  NA 138 137  K 3.3* 2.6*  CL 102 103  CO2 30 27  GLUCOSE 80 75  BUN 15 13  CREATININE 0.76 0.62  CALCIUM 8.5* 8.4*   PT/INR No results for input(s): LABPROT, INR in the last 72 hours. CMP     Component Value Date/Time   NA 137 08/08/2018 0545   K 2.6 (LL) 08/08/2018 0545   CL 103 08/08/2018 0545   CO2 27 08/08/2018 0545   GLUCOSE 75 08/08/2018 0545   BUN 13 08/08/2018 0545   CREATININE 0.62 08/08/2018 0545   CALCIUM 8.4 (L) 08/08/2018 0545   PROT 5.2 (L) 08/07/2018 0500   ALBUMIN 2.3 (L) 08/07/2018 0500   AST 37 08/07/2018 0500   ALT 24 08/07/2018 0500   ALKPHOS 64 08/07/2018 0500    BILITOT 0.8 08/07/2018 0500   GFRNONAA >60 08/08/2018 0545   GFRAA >60 08/08/2018 0545   Lipase  No results found for: LIPASE  Studies/Results: Dg Chest Port 1 View  Result Date: 08/09/2018 CLINICAL DATA:  Follow-up atelectasis EXAM: PORTABLE CHEST 1 VIEW COMPARISON:  08/03/2018 FINDINGS: Cardiac shadow is mildly enlarged but stable. Left-sided PICC line is again noted in the proximal superior vena cava. The overall inspiratory effort is poor.  Left basilar atelectatic changes are again noted. Previously seen pneumoperitoneum is again visualized beneath the left hemidiaphragm. No bony abnormality is seen. IMPRESSION: Stable left basilar atelectasis. Stable pneumoperitoneum Electronically Signed   By: Alcide Clever M.D.   On: 08/09/2018 08:57   Dg Kayleen Memos W/water Sol Cm  Result Date: 08/07/2018 CLINICAL DATA:  Gastric ulcer.  Pneumoperitoneum.  Rule out source. EXAM: WATER SOLUBLE UPPER GI SERIES TECHNIQUE: Single-column upper GI series was performed using water soluble contrast. CONTRAST:  OMNIPAQUE IOHEXOL 300 MG/ML  SOLN COMPARISON:  CT abdomen 08/06/2018 FLUOROSCOPY TIME:  Fluoroscopy Time:  2 minutes 42 second Radiation Exposure Index (if provided by the fluoroscopic device): Number of Acquired Spot Images: 0 FINDINGS: Patient with paraplegic.  Limited positioning was possible Mild esophageal dilatation and dysmotility. No esophageal stricture or mass. No ulcer or esophageal leak Gastric mucosa is normal. The fundus is normal. Review of recent CT revealed some stranding lateral to the greater curve of the stomach. Gastric mucosa normal in this area without perforation. No ulcer or mass in the stomach. Limited evaluation of the body of the stomach. Antrum and duodenal bulb normal. Anterior wall of stomach limited evaluation due to difficulty with patient positioning IMPRESSION: Negative for perforation.  No gastric ulcer or mass. Exam somewhat limited by lack of mobility. Anterior wall the stomach  not well evaluated. Electronically Signed   By: Marlan Palau M.D.   On: 08/07/2018 10:09      Jerre Simon , [facility] 08/09/2018, 9:25 AM  Pager: [phone] Mon-Wed, Friday 7:00am-4:30pm Thurs 7am-11:30am  Consults: [phone]

## 2018-08-09 NOTE — Progress Notes (Signed)
Pt has elevated temp. Tylenol given at 2030, temp remains elevated at 101.5. ST -119. On call K Schorr paged. Awaiting response.   08/09/18 1900 08/09/18 2200  Vitals  Temp (!) 100.7 F (38.2 C) (!) 101.5 F (38.6 C)  Temp Source Axillary Oral

## 2018-08-09 NOTE — Progress Notes (Signed)
Overall, [name] is doing okay.  He is still on the bivalirudin infusion.  I think we should keep him on this until we know, for sure, that he is eating and not having problems.  I do not want to keep switch him back and forth between IV and oral anticoagulation.  I realize that it is a huge inconvenience to keep having his blood checked for the bivalirudin levels.  I feel bad form for this.  However, I just think it is the best way to go for right now until we know that he is going to be able to eat and not have problems with the free air in the abdomen.  I do not think he is coughing as much.  I think it would be worthwhile getting another chest x-ray to see how the atelectasis/infiltrates look.  He is on Maxipime.  Cultures are negative.  I think if the chest x-ray looks better, I would probably get him off the antibiotics.  There is no CBC back yet from today.  Yesterday, his platelet count was 177,000.  He did have a markedly decreased potassium yesterday.  I do not think that he is having issues with diarrhea.  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  However, this particular mutation is not associated with thromboembolic disease.  Ultimately, it looks like the neurosurgeons are going to try to get him transferred down to a spinal rehab center in [place].  Hopefully, this will take place.  Hopefully, this will improve his neurological status so he will at least have some more functioning.  It is still totally unclear as to what his coagulopathy is.  Despite our testing, would not come up with any definite etiology for him to have had the bleeding post surgery and then developed thromboembolic disease.  Why his platelet count dropped significantly is also unclear.  They have come back and have normalized.  There is no real change on his physical exam.  His blood pressure is 141/85.  Pulse is 94.  His temperature was 100 degrees.  I would make sure that he is doing the  incentive spirometer hourly.  I think this is incredibly important for him to do.  I just feel bad that his emotional state is so down.  I can appreciate this given that he has had a very, very long and complicated hospital stay.  I know that everybody has been working incredibly hard to try to get him to be more functional.  Christin BachPete Ashlen Kiger, MD  1 Thessalonians 5:18

## 2018-08-09 NOTE — Progress Notes (Signed)
Pt visited by [facility] admissions liaison Hedy Maus today; parents present for visit.  Liaison familiar with pt's case as she has reviewed clinical information with pt permission.  After long discussion with pt/family, she relayed to pt family that pt is not a candidate for transfer to [facility] at this time, but may be in the future.  The reasons are that 1) [facility] is not a diagnostic facility, and pt feels he does not have a "diagnosis" for what is wrong with him. 2) Patient is not able to tolerate the 3-5 hours of acute aggressive therapy required at [facility]. 3) Insurance would automatically deny with therapy's recommendation of lower level of care.    She did state that she would follow pt's case, and if he became able to tolerate increased therapy, facility could reevaluate.  Pt and family verbalized understanding of why pt would not be accepted.    Lyzbeth Genrich W. Derry Arbogast, RN, BSN  Trauma/Neuro ICU Case Manager [phone] 

## 2018-08-09 NOTE — Progress Notes (Signed)
ANTICOAGULATION CONSULT NOTE   Pharmacy Consult for Bivalirudin  Indication: pulmonary embolus and bilateral DVT   Patient Measurements: Height: 5\' 9"  (175.3 cm) Weight: 281 lb 4.9 oz (127.6 kg) IBW/kg (Calculated) : 70.7 Bivalirudin Dosing Weight: 127.6 kg  Vital Signs: Temp: 98.6 F (37 C) (11/27 0725) Temp Source: Oral (11/27 0725) BP: 116/76 (11/27 0725) Pulse Rate: 96 (11/27 0725)  Labs: Recent Labs    08/07/18 0500  08/08/18 0545 08/08/18 0955 08/08/18 1421 08/09/18 0656  HGB 7.8*  --   --  9.9*  --  8.0*  HCT 25.7*  --   --  32.3*  --  26.6*  PLT 180  --   --  177  --  172  APTT  --    < > 88* 76* 73* 66*  CREATININE 0.76  --  0.62  --   --   --    < > = values in this interval not displayed.     Assessment:  52 yr old male on Eliquis PTA for hx of PE and DVTs. Has IVC filter. Has had multiple events of bleeding over the last couple months. HIT is negative but Heme/Onc wants bivalirudin due to consumptive process and that argatroban has risk of brady and hypotension. Restart bivalirudin on 11/25 for possible procedure. Has not needed one yet but waiting to see if he can tolerate diet before switching back to Eliquis. Last aPTT is therapeutic at 66. Hgb low but stable at 8, plts wnl.  Goal of Therapy:  APTT 50-70 seconds Monitor platelets by anticoagulation protocol: Yes   Plan:  Continue bivalirudin at 0.09mg /kg/hr  Monitor daily aPTT, CBC, s/s of bleed Change back to Eliquis once tolerating diet with no problems  Enzo BiNathan Iceis Knab, PharmD, BCPS Clinical Pharmacist Phone number [phone] 08/09/2018 8:22 AM

## 2018-08-09 NOTE — Progress Notes (Addendum)
PROGRESS NOTE    Arthor Gorter  SEG:[id number] DOB: 1966-05-30 DOA: 06/06/2018 PCP: Helane Rima, MD    Brief Narrative: 52/M with PMH of gout, hypertension, underwent anterior cervical corpectomy of C4 on 03/24/2018 with comp gated postoperative course including development of multiple PEs and DVTs started on anticoagulation and subsequently developed hematoma requiring reexploration on 03/28/2018 status post placement of IVC filter who was discharged to skilled nursing facility and had a complicated course including progressive lower extremity weakness, intermittent fevers of unclear etiology and hospitalizations for "pneumonia". Patient was admitted to this hospital as a transfer from [facility] with MRI of C-spine that showed postoperative changes and cord compression with cord edema and concern for ischemia. -status post posterior cervical decompression laminectomy of C3, C4, C5, removal of spinous processes, foraminotomies on 06/14/2018.  -On 06/22/2018 patient began to develop worsening left sided paresthesias and weakness and a stat MRI showed evidence of postoperative hematoma. Patient was taken to the OR with evacuation of hematoma.  -Additionally developed Bilateral lower extremity DVTs -on 10/9 was noted to have decreased left hand grip on 10/19, MRI showed large epidural hematoma, patient s/p clot evacuation.  He was placed back on anticoagulation argatroban on 10-22. He was notice to be more bradycardic with HR in the 40 on 10-23. Cardiology was consulted, and argatroban was transition to bivalirrubin to avoid bradycardia. His HR improved after argatroban was discontinued. -HIT panel0.486 (mildly elevated)- SRA (serotonin release assay) negative. Dr Marin Olp has been helping with patient care.  -On 10/25; developed weakness of left arm, He was take emergently to OR for recurrence of epidural hematoma on 10-25.  He was taken off of anticoagulation.   -10/28 Night: developed  left arm weakness, numbness. Stat MRI was ordered, which confirmed recurrent hematoma. Patient was taken to OR for hematoma evacuation 10-28.  -For his thrombocytopenia, Heme Dr.Ennever following, Bone marrow biopsy was done. For LE DVT, he has IVC filter. Patient has received multiple platelet transfusions and Nplate.  Patient underwent CT scan of his chest on 11/13 which showed new pulmonary embolism.  Patient was started on bivalirudin after discussions between hematology as well as neurosurgery.  Platelet counts have improved.  On Friday 11/22 pt was found to have  Free intraabdominal air, it was followed with CT abd pelvis , shows possible stranding at the greater curvature of the stomach.  Surgery consulted and ordered upper GI series which did not show obstruction.  Currently advancing diet.  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  -Complicated postop course -Status post cervical decompressive surgery on 06/14/2018 -Status post evacuation of large hematoma and placement of Hemovac drain on 06/22/2018. -After this patient had multiple more clot evacuation procedures on 10/19, 10/25, 10/28  -Repeat MRI on 07/23/2018 shows improvement in the fluid collection.  Neurosurgery continues to follow -now stable per NSG from spine standpoint -NSG Dr. Saintclair Halsted plans were to refer the patient to spine rehab center in [place], [place] following  Acute pulmonary embolism/DVT in the setting of spine surgery Status post IVC filter placement on March 28, 2018 patient has been on multiple anticoagulants which has led to recurrent episodes of hematomas at the surgical sites requiring further evacuations.  Case was discussed with vascular surgery Dr. Lonni Fix, interventional radiologist they recommended that mechanical thrombectomy and thrombolysis is not on the cards.  He was kept off anticoagulation for a brief period, due to recurrent bleeding/hematomas -However a CT scan  on the November 13  showed new pulmonary embolism with evidence of right heart.  He was subsequently started on bivalrudin.  So far he has no signs of bleeding.  And platelet counts is improving.   -Then subsequently due to pneumoperitoneum,  Eliquis held 11/24, Angiomax started 11/25 by oncology as a bridge to potential surgery.  However, upper GI series came back negative for GI perforation.  surgery started advancing diet.  -Continue bivalirudin for now, transition back to Eliquis in 1-2 days if stable, monitor CBC  Pneumoperitoneum -Appreciate general surgery consult -CT abdomen pelvis 11/24 with soft tissue stranding adjacent to the greater curvature of the body of the stomach, upper GI series on 1125 was negative for leak/perforation -At this time remains afebrile on broad-spectrum antibiotics, tolerating liquid diet and having bowel movements -Suspect he might have had a microperforation which has sealed off -Complete a 10-day course of antibiotics transition to Augmentin in 48 hours, continue PPI for stress ulcer prophylaxis -remains on SQ Bivalirudin  Thrombocytopenia/coagulopathy Patient had extensive work-up for acute thrombocytopenia.   cardiolipin antibody lupus anticoagulant, homocysteine levels factor VIII levels all within normal limits.  Protein S activity was low, protein C activity was greater than 200.  HIT panel was mildly elevated but serotonin release assay was negative.  Over the course of his hospitalization patient had multiple platelet transfusions and his platelet count has slowly increased with endplate infusions on weekly basis.   -Oncology following. -thrombocytopenia has resolved  Hyperglycemia secondary to steroids Currently on Lantus and sliding scale insulin. Hemoglobin A1c was 5.8 when last checked.  Anemia due to acute illness -Stable, improving  Sinus bradycardia - Is currently asymptomatic, TSH was within normal limits.  - Plan to avoid AV nodal  blocking agents.  Fluid overload with upper and lower extremity edema  -Third spacing patient has diuresed well with IV lasix -Now held  Adjustment disorder with depression and anxiety Resume Klonopin as needed.  Essential hypertension  -well controlled  Acute renal failure Resolved  Stage II pressure ulcer on the right ischial tuberosity not present on admission  wound care consulted and recommendations given.  Oropharyngeal candidiasis He was given a course of fluconazole.   Acute diarrhea Resolved   DVT prophylaxis: ted stockings.  Code Status:  Full code Family Communication: family at bedside.  Disposition Plan: pending clinical improvement.    Consultants:   NS Dr Saintclair Halsted  Hematology  Dr Marin Olp.   Cardiology.   Surgery Dr Donne Hazel.    Procedures:  On 10/2 1. posterior cervical decompressive laminectomy at C3, C4, C5 with removal of the spinous process and complete laminectomy and foraminotomies of the C3, C4, C5 nerve roots. 2. Posterior cervical fusion with lateral mass screws at C3-C4-C5 and pars screws at C2 lies in the globus ellipseLateral mass screw system 3. Posterior lateral arthrodesis C2-C5 utilizing locally harvested autograft mixed with vivigen  06/22/2018: Procedure reexploration posterior cervical wound for evacuation of epidural hematoma  06/26/2018 DVT ultrasound:Right: Findings consistent with acute deep vein thrombosis involving the right common femoral vein, right femoral vein, right popliteal vein, right posterior tibial vein, and right peroneal vein. Thrombosis extends proximally into external iliac vein.  Proximal iliac vein not visualized. Left: Findings consistent with acute deep vein thrombosis involving the left common femoral vein, left femoral vein, left popliteal vein, left posterior tibial vein, and left peroneal vein. Findings consistent with acute superficial vein thrombosis  involving the left great saphenous vein.  Thrombosis extends proximally into external iliac vein. Proximal iliac vein not  visualized.  07/01/18 Clot evacuation   RUE doppler 07/02/18 Antimicrobials: vancomycin and zosyn since 11/23 for sepsis and abdominal infection.   Subjective: -no new complaints -tolerating diet -wants to know about rehab options  Objective: Vitals:   08/09/18 0725 08/09/18 0800 08/09/18 1100 08/09/18 1151  BP: 116/76 129/85  139/88  Pulse: 96 96 (!) 102 (!) 106  Resp:  (!) '27 20 17  ' Temp: 98.6 F (37 C)   99.1 F (37.3 C)  TempSrc: Oral   Oral  SpO2: 95% 95% 98% 99%  Weight:      Height:        Intake/Output Summary (Last 24 hours) at 08/09/2018 1418 Last data filed at 08/09/2018 1200 Gross per 24 hour  Intake 600 ml  Output 850 ml  Net -250 ml   Filed Weights   07/07/18 1000 07/18/18 0600 07/27/18 1502  Weight: 123.8 kg 135 kg 127.6 kg    Examination: Gen: Obese, chronically ill-appearing male HEENT: PERRLA Lungs: Good air movement bilaterally, CTAB CVS: 1 S2/regular rate rhythm Abd: soft, Non tender, non distended, BS present Extremities: No Cyanosis, Clubbing or edema Skin: no new rashes Neuro: Wake alert, oriented x4, right upper extremity strength is 5 x 5, left upper extremity has diminished proximal muscle strength and 4+ at the wrist -Able to wiggle his toes on the right foot, no movements noted in the left leg PSYCH: depressed, flat affect  Data Reviewed: I have personally reviewed following labs and imaging studies  CBC: Recent Labs  Lab 08/05/18 0544 08/06/18 0615 08/07/18 0500 08/08/18 0955 08/09/18 0656  WBC 8.9 8.0 7.1 9.4 10.1  HGB 8.5* 7.5* 7.8* 9.9* 8.0*  HCT 28.3* 24.6* 25.7* 32.3* 26.6*  MCV 101.8* 99.2 98.8 94.2 96.7  PLT 210 190 180 177 505   Basic Metabolic Panel: Recent Labs  Lab 08/04/18 0500 08/05/18 0544 08/06/18 0615 08/07/18 0500 08/08/18 0545  NA 141 136 136 138 137  K 4.0 3.9 3.4* 3.3* 2.6*  CL 106 98 98 102 103  CO2 '27 26 30  30 27  ' GLUCOSE 71 247* 152* 80 75  BUN 28* 26* 22* 15 13  CREATININE 0.60* 0.92 0.80 0.76 0.62  CALCIUM 8.7* 8.3* 8.2* 8.5* 8.4*   GFR: Estimated Creatinine Clearance: 142.8 mL/min (by C-G formula based on SCr of 0.62 mg/dL). Liver Function Tests: Recent Labs  Lab 08/03/18 0846 08/04/18 0500 08/05/18 0544 08/06/18 0615 08/07/18 0500  AST 34 38 47* 39 37  ALT 27 27 33 27 24  ALKPHOS 60 54 77 68 64  BILITOT 0.8 0.7 0.6 0.6 0.8  PROT 5.5* 5.5* 5.5* 5.1* 5.2*  ALBUMIN 2.8* 2.7* 2.7* 2.3* 2.3*   No results for input(s): LIPASE, AMYLASE in the last 168 hours. No results for input(s): AMMONIA in the last 168 hours. Coagulation Profile: No results for input(s): INR, PROTIME in the last 168 hours. Cardiac Enzymes: No results for input(s): CKTOTAL, CKMB, CKMBINDEX, TROPONINI in the last 168 hours. BNP (last 3 results) No results for input(s): PROBNP in the last 8760 hours. HbA1C: No results for input(s): HGBA1C in the last 72 hours. CBG: Recent Labs  Lab 08/08/18 1127 08/08/18 1618 08/08/18 2151 08/09/18 0729 08/09/18 1155  GLUCAP 81 173* 149* 115* 120*   Lipid Profile: No results for input(s): CHOL, HDL, LDLCALC, TRIG, CHOLHDL, LDLDIRECT in the last 72 hours. Thyroid Function Tests: No results for input(s): TSH, T4TOTAL, FREET4, T3FREE, THYROIDAB in the last 72 hours. Anemia Panel: Recent Labs  08/07/18 1815  FERRITIN 1,415*  TIBC 245*  IRON 37*   Sepsis Labs: Recent Labs  Lab 08/05/18 1141 08/05/18 1812 08/06/18 1802  LATICACIDVEN 5.0* 3.0* 2.3*    Recent Results (from the past 240 hour(s))  Culture, blood (Routine X 2) w Reflex to ID Panel     Status: None (Preliminary result)   Collection Time: 08/05/18  9:40 AM  Result Value Ref Range Status   Specimen Description BLOOD RIGHT ANTECUBITAL  Final   Special Requests   Final    BOTTLES DRAWN AEROBIC ONLY Blood Culture adequate volume   Culture   Final    NO GROWTH 4 DAYS Performed at [facility], [place] [address] [place], [place] [address]    Report Status PENDING  Incomplete  Culture, blood (Routine X 2) w Reflex to ID Panel     Status: None (Preliminary result)   Collection Time: 08/05/18  9:47 AM  Result Value Ref Range Status   Specimen Description BLOOD RIGHT ANTECUBITAL  Final   Special Requests   Final    BOTTLES DRAWN AEROBIC ONLY Blood Culture adequate volume   Culture   Final    NO GROWTH 4 DAYS Performed at [facility], [place] [address] [place], [place] [address]    Report Status PENDING  Incomplete         Radiology Studies: Dg Chest Port 1 View  Result Date: 08/09/2018 CLINICAL DATA:  Follow-up atelectasis EXAM: PORTABLE CHEST 1 VIEW COMPARISON:  08/03/2018 FINDINGS: Cardiac shadow is mildly enlarged but stable. Left-sided PICC line is again noted in the proximal superior vena cava. The overall inspiratory effort is poor. Left basilar atelectatic changes are again noted. Previously seen pneumoperitoneum is again visualized beneath the left hemidiaphragm. No bony abnormality is seen. IMPRESSION: Stable left basilar atelectasis. Stable pneumoperitoneum Electronically Signed   By: Inez Catalina M.D.   On: 08/09/2018 08:57        Scheduled Meds: . sodium chloride   Intravenous Once  . Chlorhexidine Gluconate Cloth  6 each Topical Q0600  . clonazePAM  0.5 mg Oral BID  . famotidine  20 mg Oral Daily  . feeding supplement (ENSURE ENLIVE)  237 mL Oral BID BM  . gabapentin  300 mg Oral TID  . Gerhardt's butt cream   Topical BID  . Influenza vac split quadrivalent PF  0.5 mL Intramuscular Tomorrow-1000  . insulin aspart  0-15 Units Subcutaneous TID WC  . insulin aspart  0-5 Units Subcutaneous QHS  . insulin glargine  20 Units Subcutaneous QHS  . ipratropium-albuterol  3 mL Nebulization Q12H  . lipase/protease/amylase  36,000 Units Oral TID AC  . magic mouthwash w/lidocaine  5 mL Oral TID  . methocarbamol  500 mg Oral QID  . midodrine  5 mg Oral  BID WC  . multivitamin with minerals  1 tablet Oral Daily  . saccharomyces boulardii  250 mg Oral BID  . sodium chloride flush  10-40 mL Intracatheter Q12H   Continuous Infusions: . sodium chloride 250 mL (08/09/18 0529)  . bivalirudin (ANGIOMAX) infusion 0.5 mg/mL (Non-ACS indications) 0.09 mg/kg/hr (08/08/18 2143)  . ceFEPime (MAXIPIME) IV 1 g (08/09/18 1335)     LOS: 63 days   Domenic Polite, MD [facility] Page via [email or web address]  If 7PM-7AM, please contact night-coverage [email or web address] Password [email or web address] 08/09/2018, 2:18 PM

## 2018-08-09 NOTE — Plan of Care (Signed)
  Problem: Education: Goal: Knowledge of General Education information will improve Description Including pain rating scale, medication(s)/side effects and non-pharmacologic comfort measures Outcome: Progressing Note:  POC reviewed with pt./family.   

## 2018-08-10 ENCOUNTER — Inpatient Hospital Stay (HOSPITAL_COMMUNITY)

## 2018-08-10 DIAGNOSIS — M7989 Other specified soft tissue disorders: Secondary | ICD-10-CM

## 2018-08-10 LAB — CBC
HCT: 30 % — ABNORMAL LOW (ref 39.0–52.0)
Hemoglobin: 9.1 g/dL — ABNORMAL LOW (ref 13.0–17.0)
MCH: 29.1 pg (ref 26.0–34.0)
MCHC: 30.3 g/dL (ref 30.0–36.0)
MCV: 95.8 fL (ref 80.0–100.0)
PLATELETS: 149 10*3/uL — AB (ref 150–400)
RBC: 3.13 MIL/uL — ABNORMAL LOW (ref 4.22–5.81)
RDW: 20.6 % — AB (ref 11.5–15.5)
WBC: 9.9 10*3/uL (ref 4.0–10.5)
nRBC: 2.2 % — ABNORMAL HIGH (ref 0.0–0.2)

## 2018-08-10 LAB — CULTURE, BLOOD (ROUTINE X 2)
CULTURE: NO GROWTH
Culture: NO GROWTH
SPECIAL REQUESTS: ADEQUATE
SPECIAL REQUESTS: ADEQUATE

## 2018-08-10 LAB — GLUCOSE, CAPILLARY
GLUCOSE-CAPILLARY: 105 mg/dL — AB (ref 70–99)
Glucose-Capillary: 113 mg/dL — ABNORMAL HIGH (ref 70–99)
Glucose-Capillary: 118 mg/dL — ABNORMAL HIGH (ref 70–99)
Glucose-Capillary: 134 mg/dL — ABNORMAL HIGH (ref 70–99)

## 2018-08-10 LAB — URIC ACID: Uric Acid, Serum: 8.1 mg/dL (ref 3.7–8.6)

## 2018-08-10 LAB — APTT: APTT: 70 s — AB (ref 24–36)

## 2018-08-10 NOTE — Progress Notes (Signed)
VASCULAR LAB PRELIMINARY  PRELIMINARY  PRELIMINARY  PRELIMINARY  Left upper extremity venous duplex completed.    Preliminary report:  There is acute DVT noted in the left subclavian, axillary, brachial, and radial veins.  There is superficial thrombosis noted in the left cephalic and basilic veins.  Yosiel Thieme, RVT 08/10/2018, 5:44 PM

## 2018-08-10 NOTE — Progress Notes (Signed)
RN to follow up with patient MD as far as vascular access.  Patient currently has DVT in left subclavian,axillary, brachial, radial, cephalic and basilic veins.  Midline placement at this time is inappropriate due to it is only a single lumen and patient on multiple non compatible medications. Patient RN will notify IV/VAST team if needed.

## 2018-08-10 NOTE — Progress Notes (Signed)
PROGRESS NOTE    [name] [name]  CZY:[id number] DOB: [birth year] DOA: 06/06/2018 PCP: Helane Rima, MD    Brief Narrative: 52/M with PMH of gout, hypertension, underwent anterior cervical corpectomy of C4 on 03/24/2018 with comp gated postoperative course including development of multiple PEs and DVTs started on anticoagulation and subsequently developed hematoma requiring reexploration on 03/28/2018 status post placement of IVC filter who was discharged to skilled nursing facility and had a complicated course including progressive lower extremity weakness, intermittent fevers of unclear etiology and hospitalizations for "pneumonia". Patient was admitted to this hospital as a transfer from [facility] with MRI of C-spine that showed postoperative changes and cord compression with cord edema and concern for ischemia. -status post posterior cervical decompression laminectomy of C3, C4, C5, removal of spinous processes, foraminotomies on 06/14/2018.  -On 06/22/2018 patient began to develop worsening left sided paresthesias and weakness and a stat MRI showed evidence of postoperative hematoma. Patient was taken to the OR with evacuation of hematoma.  -Additionally developed Bilateral lower extremity DVTs -on 10/9 was noted to have decreased left hand grip on 10/19, MRI showed large epidural hematoma, patient s/p clot evacuation.  He was placed back on anticoagulation argatroban on 10-22. He was notice to be more bradycardic with HR in the 40 on 10-23. Cardiology was consulted, and argatroban was transition to bivalirrubin to avoid bradycardia. His HR improved after argatroban was discontinued. -HIT panel0.486 (mildly elevated)- SRA (serotonin release assay) negative. Dr Marin Olp has been helping with patient care.  -On 10/25; developed weakness of left arm, He was take emergently to OR for recurrence of epidural hematoma on 10-25.  He was taken off of anticoagulation.   -10/28 Night: developed  left arm weakness, numbness. Stat MRI was ordered, which confirmed recurrent hematoma. Patient was taken to OR for hematoma evacuation 10-28.  -For his thrombocytopenia, Heme Dr.Ennever following, Bone marrow biopsy was done. For LE DVT, he has IVC filter. Patient has received multiple platelet transfusions and Nplate.  Patient underwent CT scan of his chest on 11/13 which showed new pulmonary embolism.  Patient was started on bivalirudin after discussions between hematology as well as neurosurgery.  Platelet counts have improved.  On 11/22 pt was found to have free intraabdominal air, it was followed with CT abd pelvis , shows possible stranding at the greater curvature of the stomach.  Surgery consulted and ordered upper GI series which did not show obstruction.  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  -Complicated postop course -Status post cervical decompressive surgery on 06/14/2018 -Status post evacuation of large hematoma and placement of Hemovac drain on 06/22/2018. -After this patient had multiple more clot evacuation procedures on 10/19, 10/25, 10/28  -Repeat MRI on 07/23/2018 shows improvement in the fluid collection.  Neurosurgery continues to follow -now stable per NSG from spine standpoint -Needs aggressive rehab  Acute pulmonary embolism/DVT in the setting of spine surgery Status post IVC filter placement on March 28, 2018 patient has been on multiple anticoagulants which has led to recurrent episodes of hematomas at the surgical sites requiring further evacuations.  Case was discussed with vascular surgery Dr. Lonni Fix, interventional radiologist they recommended that mechanical thrombectomy and thrombolysis is not on the cards.  He was kept off anticoagulation for a brief period, due to recurrent bleeding/hematomas -However CT chest on 111/13 showed new pulmonary embolism with evidence of right heart.  He was  subsequently started on bivalrudin, platelet counts is  improving.   -Then subsequently due to pneumoperitoneum,  Eliquis held 11/24, Angiomax started 11/25 by oncology in case Surgery needed, upper GI series came back negative for GI perforation.  surgery started advancing diet.  -Continue bivalirudin for now, transition back to Eliquis in 1-2 days if stable, monitor CBC -Left arm swelling could be PICC line associated DVT we will check Dopplers if positive will need PICC line removed, continue bivalirudin  Fever 11/28 -Could be related to potential DVT and left upper extremity -Possibility is gout flare in the left ankle, slight tenderness and limited mobility noted, uric acid -We will consider prednisone/colchicine as indicated  Pneumoperitoneum -Incidentally noted to have pneumoperitoneum on 11/24 -General surgery consulted -CT abdomen pelvis 11/24 with soft tissue stranding adjacent to the greater curvature of the body of the stomach, upper GI series on 1125 was negative for leak/perforation -At this time remains afebrile on broad-spectrum antibiotics, tolerating diet and having bowel movements -Suspect he might have had a microperforation which has sealed off -Complete a 10-day course of antibiotics, continue PPI for stress ulcer prophylaxis -remains on SQ Bivalirudin  Thrombocytopenia/coagulopathy Patient had extensive work-up for acute thrombocytopenia.   cardiolipin antibody lupus anticoagulant, homocysteine levels factor VIII levels all within normal limits.  Protein S activity was low, protein C activity was greater than 200.  HIT panel was mildly elevated but serotonin release assay was negative.  Over the course of his hospitalization patient had multiple platelet transfusions and his platelet count has slowly increased with endplate infusions on weekly basis.   -Oncology following. -thrombocytopenia has resolved  Hyperglycemia secondary to steroids Currently on Lantus and  sliding scale insulin. Hemoglobin A1c was 5.8 when last checked.  Anemia due to acute illness -Stable, improving  Sinus bradycardia - Is currently asymptomatic, TSH was within normal limits.  - Plan to avoid AV nodal blocking agents.  Fluid overload with upper and lower extremity edema  -Third spacing patient has diuresed well with IV lasix -Now held  Adjustment disorder with depression and anxiety Resume Klonopin as needed.  Essential hypertension  -well controlled  Acute renal failure Resolved  Stage II pressure ulcer on the right ischial tuberosity not present on admission  wound care consulted and recommendations given.  Oropharyngeal candidiasis -Completed course of fluconazole.  Acute diarrhea Resolved   DVT prophylaxis: Bivalrudin Code Status:  Full code Family Communication: no family at bedside.  Disposition Plan: Will need prolonged rehab   Consultants:   NS Dr Saintclair Halsted  Hematology  Dr Marin Olp.   Cardiology.   Surgery Dr Donne Hazel.    Procedures:  On 10/2 1. posterior cervical decompressive laminectomy at C3, C4, C5 with removal of the spinous process and complete laminectomy and foraminotomies of the C3, C4, C5 nerve roots. 2. Posterior cervical fusion with lateral mass screws at C3-C4-C5 and pars screws at C2 lies in the globus ellipseLateral mass screw system 3. Posterior lateral arthrodesis C2-C5 utilizing locally harvested autograft mixed with vivigen  06/22/2018: Procedure reexploration posterior cervical wound for evacuation of epidural hematoma  06/26/2018 DVT ultrasound:Right: Findings consistent with acute deep vein thrombosis involving the right common femoral vein, right femoral vein, right popliteal vein, right posterior tibial vein, and right peroneal vein. Thrombosis extends proximally into external iliac vein.  Proximal iliac vein not visualized. Left: Findings consistent with acute deep vein thrombosis involving the left  common femoral vein, left femoral vein, left popliteal vein, left posterior tibial vein, and left peroneal vein. Findings consistent with acute superficial vein thrombosis  involving the left great saphenous vein. Thrombosis extends proximally into external iliac vein. Proximal iliac vein not visualized.  07/01/18 Clot evacuation   RUE doppler 07/02/18 Antimicrobials: vancomycin and zosyn since 11/23 for sepsis and abdominal infection.   Subjective: -Fever of 101 last night, left arm swelling, will left ankle tenderness and pain Objective: Vitals:   08/10/18 0000 08/10/18 0402 08/10/18 0729 08/10/18 0848  BP: 102/74 114/81 126/83 131/82  Pulse: (!) 105 99 96 96  Resp: 18 18 (!) 21 (!) 36  Temp:  99 F (37.2 C) 98.5 F (36.9 C)   TempSrc:  Oral Oral   SpO2:  97% 95% 96%  Weight:      Height:        Intake/Output Summary (Last 24 hours) at 08/10/2018 1027 Last data filed at 08/10/2018 0900 Gross per 24 hour  Intake 922 ml  Output 900 ml  Net 22 ml   Filed Weights   07/07/18 1000 07/18/18 0600 07/27/18 1502  Weight: 123.8 kg 135 kg 127.6 kg    Examination: Gen: obese, chronically ill-appearing male HEENT: S1-S2/regular rate rhythm Lungs: Wheeze breath sounds at bases CVS: RRR,No Gallops,Rubs or new Murmurs Abd: soft, Non tender, non distended, BS present Extremities: Left upper extremity with PICC line and swelling noted, swelling and mild tenderness of the left ankle joint noted as well Neuro: awake alert, oriented x4, right upper extremity strength is 5 x 5, left upper extremity has diminished proximal muscle strength and 4+ at the wrist -Able to wiggle his toes on the right foot, no movements noted in the left leg PSYCH: depressed, flat affect  Data Reviewed: I have personally reviewed following labs and imaging studies  CBC: Recent Labs  Lab 08/06/18 0615 08/07/18 0500 08/08/18 0955 08/09/18 0656 08/10/18 0408  WBC 8.0 7.1 9.4 10.1 9.9  HGB 7.5* 7.8* 9.9*  8.0* 9.1*  HCT 24.6* 25.7* 32.3* 26.6* 30.0*  MCV 99.2 98.8 94.2 96.7 95.8  PLT 190 180 177 172 818*   Basic Metabolic Panel: Recent Labs  Lab 08/04/18 0500 08/05/18 0544 08/06/18 0615 08/07/18 0500 08/08/18 0545  NA 141 136 136 138 137  K 4.0 3.9 3.4* 3.3* 2.6*  CL 106 98 98 102 103  CO2 '27 26 30 30 27  '$ GLUCOSE 71 247* 152* 80 75  BUN 28* 26* 22* 15 13  CREATININE 0.60* 0.92 0.80 0.76 0.62  CALCIUM 8.7* 8.3* 8.2* 8.5* 8.4*   GFR: Estimated Creatinine Clearance: 142.8 mL/min (by C-G formula based on SCr of 0.62 mg/dL). Liver Function Tests: Recent Labs  Lab 08/04/18 0500 08/05/18 0544 08/06/18 0615 08/07/18 0500  AST 38 47* 39 37  ALT 27 33 27 24  ALKPHOS 54 77 68 64  BILITOT 0.7 0.6 0.6 0.8  PROT 5.5* 5.5* 5.1* 5.2*  ALBUMIN 2.7* 2.7* 2.3* 2.3*   No results for input(s): LIPASE, AMYLASE in the last 168 hours. No results for input(s): AMMONIA in the last 168 hours. Coagulation Profile: No results for input(s): INR, PROTIME in the last 168 hours. Cardiac Enzymes: No results for input(s): CKTOTAL, CKMB, CKMBINDEX, TROPONINI in the last 168 hours. BNP (last 3 results) No results for input(s): PROBNP in the last 8760 hours. HbA1C: No results for input(s): HGBA1C in the last 72 hours. CBG: Recent Labs  Lab 08/09/18 0729 08/09/18 1155 08/09/18 1718 08/09/18 2207 08/10/18 0733  GLUCAP 115* 120* 138* 191* 113*   Lipid Profile: No results for input(s): CHOL, HDL, LDLCALC, TRIG, CHOLHDL, LDLDIRECT in the last  72 hours. Thyroid Function Tests: No results for input(s): TSH, T4TOTAL, FREET4, T3FREE, THYROIDAB in the last 72 hours. Anemia Panel: Recent Labs    08/07/18 1815  FERRITIN 1,415*  TIBC 245*  IRON 37*   Sepsis Labs: Recent Labs  Lab 08/05/18 1141 08/05/18 1812 08/06/18 1802  LATICACIDVEN 5.0* 3.0* 2.3*    Recent Results (from the past 240 hour(s))  Culture, blood (Routine X 2) w Reflex to ID Panel     Status: None   Collection Time:  08/05/18  9:40 AM  Result Value Ref Range Status   Specimen Description BLOOD RIGHT ANTECUBITAL  Final   Special Requests   Final    BOTTLES DRAWN AEROBIC ONLY Blood Culture adequate volume   Culture   Final    NO GROWTH 5 DAYS Performed at [facility], [place] [address], [place], [place] [address]    Report Status 08/10/2018 FINAL  Final  Culture, blood (Routine X 2) w Reflex to ID Panel     Status: None   Collection Time: 08/05/18  9:47 AM  Result Value Ref Range Status   Specimen Description BLOOD RIGHT ANTECUBITAL  Final   Special Requests   Final    BOTTLES DRAWN AEROBIC ONLY Blood Culture adequate volume   Culture   Final    NO GROWTH 5 DAYS Performed at [facility], [place] [address], [place], [place] [address]    Report Status 08/10/2018 FINAL  Final         Radiology Studies: Dg Chest Port 1 View  Result Date: 08/09/2018 CLINICAL DATA:  Follow-up atelectasis EXAM: PORTABLE CHEST 1 VIEW COMPARISON:  08/03/2018 FINDINGS: Cardiac shadow is mildly enlarged but stable. Left-sided PICC line is again noted in the proximal superior vena cava. The overall inspiratory effort is poor. Left basilar atelectatic changes are again noted. Previously seen pneumoperitoneum is again visualized beneath the left hemidiaphragm. No bony abnormality is seen. IMPRESSION: Stable left basilar atelectasis. Stable pneumoperitoneum Electronically Signed   By: Inez Catalina M.D.   On: 08/09/2018 08:57        Scheduled Meds: . sodium chloride   Intravenous Once  . Chlorhexidine Gluconate Cloth  6 each Topical Q0600  . clonazePAM  0.5 mg Oral BID  . famotidine  20 mg Oral Daily  . feeding supplement (ENSURE ENLIVE)  237 mL Oral BID BM  . gabapentin  300 mg Oral TID  . Gerhardt's butt cream   Topical BID  . Influenza vac split quadrivalent PF  0.5 mL Intramuscular Tomorrow-1000  . insulin aspart  0-15 Units Subcutaneous TID WC  . insulin aspart  0-5 Units Subcutaneous QHS  . insulin  glargine  20 Units Subcutaneous QHS  . ipratropium-albuterol  3 mL Nebulization BID  . lipase/protease/amylase  36,000 Units Oral TID AC  . magic mouthwash w/lidocaine  5 mL Oral TID  . methocarbamol  500 mg Oral QID  . midodrine  5 mg Oral BID WC  . multivitamin with minerals  1 tablet Oral Daily  . saccharomyces boulardii  250 mg Oral BID  . sodium chloride flush  10-40 mL Intracatheter Q12H   Continuous Infusions: . sodium chloride 250 mL (08/09/18 0529)  . bivalirudin (ANGIOMAX) infusion 0.5 mg/mL (Non-ACS indications) 0.09 mg/kg/hr (08/09/18 2135)  . ceFEPime (MAXIPIME) IV 1 g (08/10/18 0536)     LOS: 64 days   Domenic Polite, MD [facility] Page via [email or web address]  If 7PM-7AM, please contact night-coverage [email or web address] Password [facility] 08/10/2018, 10:26  AM

## 2018-08-10 NOTE — Progress Notes (Signed)
Patient ID: [name] [name], male   DOB: Sep 14, 1965, 52 y.o.   MRN: [id number] Overall patient doing okay increased swelling left upper extremity restricting movement of his left hand. Otherwise neurologically stable. I think the possibility of left upper extremity DVT with placement of left IV should be investigated with ultrasound I discussed this with primary medical team. Neurological the patient stable for transfer to rehabilitation when medically stable.

## 2018-08-10 NOTE — Progress Notes (Signed)
ANTICOAGULATION CONSULT NOTE   Pharmacy Consult for Bivalirudin  Indication: pulmonary embolus and bilateral DVT   Patient Measurements: Height: 5\' 9"  (175.3 cm) Weight: 281 lb 4.9 oz (127.6 kg) IBW/kg (Calculated) : 70.7 Bivalirudin Dosing Weight: 127.6 kg  Vital Signs: Temp: 99 F (37.2 C) (11/28 0402) Temp Source: Oral (11/28 0402) BP: 114/81 (11/28 0402) Pulse Rate: 99 (11/28 0402)  Labs: Recent Labs    08/08/18 0545  08/08/18 0955 08/08/18 1421 08/09/18 0656 08/10/18 0408  HGB  --    < > 9.9*  --  8.0* 9.1*  HCT  --   --  32.3*  --  26.6* 30.0*  PLT  --   --  177  --  172 149*  APTT 88*  --  76* 73* 66* 70*  CREATININE 0.62  --   --   --   --   --    < > = values in this interval not displayed.     Assessment:  52 yr old male on Eliquis PTA for hx of PE and DVTs. Has IVC filter. Has had multiple events of bleeding over the last couple months. HIT is negative but Heme/Onc wants bivalirudin due to consumptive process and that argatroban has risk of brady and hypotension. Restart bivalirudin on 11/25 for possible procedure. Has not needed one yet but waiting to see if he can tolerate diet before switching back to Eliquis.   Patient seems to be tolerating diet now, but he was febrile overnight and it is unclear if this could be an abdominal source. Continue bivalirudin for now for possible procedures.  Last aPTT is therapeutic at 70. Hgb low but stable at 9.1, plts 149.   Goal of Therapy:  APTT 50-70 seconds Monitor platelets by anticoagulation protocol: Yes   Plan:  Continue bivalirudin at 0.09mg /kg/hr  Monitor daily aPTT, CBC, s/s of bleed Change back to Eliquis once tolerating diet with no problems  Danae OrleansMegan Brynne Doane, PharmD PGY1 Pharmacy Resident Phone [phone] 08/10/2018       7:32 AM

## 2018-08-11 LAB — GLUCOSE, CAPILLARY
Glucose-Capillary: 92 mg/dL (ref 70–99)
Glucose-Capillary: 68 mg/dL — ABNORMAL LOW (ref 70–99)
Glucose-Capillary: 92 mg/dL (ref 70–99)
Glucose-Capillary: 93 mg/dL (ref 70–99)

## 2018-08-11 LAB — BASIC METABOLIC PANEL
Anion gap: 5 (ref 5–15)
BUN: 11 mg/dL (ref 6–20)
CO2: 24 mmol/L (ref 22–32)
Calcium: 8.7 mg/dL — ABNORMAL LOW (ref 8.9–10.3)
Chloride: 111 mmol/L (ref 98–111)
Creatinine, Ser: 0.58 mg/dL — ABNORMAL LOW (ref 0.61–1.24)
GFR calc Af Amer: >60 mL/min (ref >60)
GFR calc non Af Amer: >60 mL/min (ref >60)
Glucose, Bld: 109 mg/dL — ABNORMAL HIGH (ref 70–99)
Potassium: 3.8 mmol/L (ref 3.5–5.1)
Sodium: 140 mmol/L (ref 135–145)

## 2018-08-11 LAB — CBC
HCT: 29.1 % — ABNORMAL LOW (ref 39.0–52.0)
Hemoglobin: 8.9 g/dL — ABNORMAL LOW (ref 13.0–17.0)
MCH: 29.6 pg (ref 26.0–34.0)
MCHC: 30.6 g/dL (ref 30.0–36.0)
MCV: 96.7 fL (ref 80.0–100.0)
Platelets: 149 10*3/uL — ABNORMAL LOW (ref 150–400)
RBC: 3.01 MIL/uL — AB (ref 4.22–5.81)
RDW: 20.6 % — ABNORMAL HIGH (ref 11.5–15.5)
WBC: 9.8 10*3/uL (ref 4.0–10.5)
nRBC: 3.1 % — ABNORMAL HIGH (ref 0.0–0.2)

## 2018-08-11 LAB — APTT
aPTT: 63 seconds — ABNORMAL HIGH (ref 24–36)
aPTT: 66 seconds — ABNORMAL HIGH (ref 24–36)
aPTT: 59 seconds — ABNORMAL HIGH (ref 24–36)

## 2018-08-11 MED ORDER — ARGATROBAN 50 MG/50ML IV SOLN
1.0000 ug/kg/min | INTRAVENOUS | Status: DC
  Administered 2018-08-11 – 2018-08-16 (×18): 1 ug/kg/min via INTRAVENOUS
  Filled 2018-08-11 (×20): qty 50

## 2018-08-11 NOTE — Progress Notes (Signed)
ANTICOAGULATION CONSULT NOTE   Pharmacy Consult for Argatroban  Indication: pulmonary embolus and bilateral DVT   Patient Measurements: Height: 5\' 9"  (175.3 cm) Weight: 281 lb 4.9 oz (127.6 kg) IBW/kg (Calculated) : 70.7 Bivalirudin Dosing Weight: 127.6 kg  Vital Signs: Temp: 98.4 F (36.9 C) (11/29 1113) Temp Source: Oral (11/29 1113) BP: 133/84 (11/29 1113) Pulse Rate: 93 (11/29 1113)  Labs: Recent Labs    08/09/18 0656 08/10/18 0408 08/11/18 0639 08/11/18 1239  HGB 8.0* 9.1* 8.9*  --   HCT 26.6* 30.0* 29.1*  --   PLT 172 149* 149*  --   APTT 66* 70* 63* 66*  CREATININE  --   --  0.58*  --     Assessment: 52 yr old male on Eliquis PTA for hx of PE and DVTs. Has IVC filter. Has had multiple events of bleeding over the last couple months. HIT is negative but started on Bivalirudin on 11/25. Found to have new thrombus on 11/29 and heme/onc wants to transition to Argatroban for anticoagulation treatment. Hgb 8.9, PLTc 149.  Initial aPTT is therapeutic at 66. No issues with infusion per RN.   Goal of Therapy:  APTT 50-90 seconds Monitor platelets by anticoagulation protocol: Yes   Plan:  1. Continue Argatroban 421mcg/kg/min (7.6 ml/hr) 2. Recheck aPTT in 2 hours  3. Monitor daily aPTT, CBC, s/s of bleed 4. Await long term plan on anticoagulation   Pollyann SamplesAndy Jax Abdelrahman, PharmD, BCPS 08/11/2018, 2:20 PM

## 2018-08-11 NOTE — Progress Notes (Signed)
Patient initially refusing any further blood draws due to having to be stuck multiple times per day and multiple attempts each time.  Patient is on argatroban which requires multiple aPTT draws per day to ensure therapeutic levels and to prevent bleeding.  After discussion with patient he is agreeable for aPTT draws, but asks that other labs try to be combined with these draws to try to limit the amount of draws per day as we can only draw from 1 arm.  Contacted lab to re-attempt to draw aPTT that was due earlier this evening  Isaac BlissMichael Iszabella Hebenstreit, PharmD, BCPS, BCCCP Clinical Pharmacist [phone]  Please check AMION for all [facility] numbers  08/11/2018 8:41 PM

## 2018-08-11 NOTE — Progress Notes (Signed)
Unfortunately, we now have a new issue.  Despite being on the bivalirudin infusion, he has developed a thrombus in the left subclavian vein.  He also has had a temperature of 102.6.  I just wonder if the temperature is somehow reflective of the new thrombus.  Again, all of our studies have been negative for any hypercoagulable state.  I am just very distressed over the fact that he has apparently developed a new thrombus.  I feel that he has been pretty well anticoagulated.  I think our option would be to switch him over to argatroban.  I still am very concerned about put him on IV heparin.  I realize that there might be some issues with the argatroban.  However, he is on a cardiac monitor.  His platelet count is also slowly trending downward.  This might be an element of his thrombus.  Again has been checked for lupus anticoagulant.  He has been checked  for anticardiolipin antibodies and these have all been unremarkable.  I agree that the PICC line will have to be removed.  I do not think that he would be a candidate for thrombolytic therapy given that there is this free air in his abdomen.  Christin BachPete Ennever, MD  Psalm 106:1

## 2018-08-11 NOTE — Progress Notes (Signed)
Text message sent to Dr Donnetta Simpershris Bodenheimer, about temp 102.6 given Tylenol and IV needing to be changed from Left arm PICC to Central line due to medications need. Dr called immediately back stating he would pass information on to day shift.

## 2018-08-11 NOTE — Progress Notes (Addendum)
Physical Therapy Treatment Patient Details Name: [name] [name] MRN: [id number] DOB: 16-May-1966 Today's Date: 08/11/2018    History of Present Illness 52 y.o. male admitted on 06/06/18 for bil LE weakness and muscle spasm after multiple falls (with resultant R knee meniscus tear placed in bledsoe brace) following a recent ACDF at [facility] in July 2019 (complicated by PE (s/p IVC filter), CIR, and then SNF placement before finally returning home with his parents).  In the ED MRI of c-spine which showed post operative changes and cord edema and possibility of ischemia.  His R knee was aspirated on 06/09/18.  S/p posterior cervical decompression and fusion on 06/14/18. Entire acute course complicated by hypotension in standing (thought to be autonomic) and by finding of multiple LE DVTs (already has an IVC filter in place, but per MDs notes at risk for blocking the filter with these clots).  Pt with post op issues with incisional bleeding s/p 2 evacuations of hematomas and placement of hemovac (06/22/18 and 07/01/18) as well as multiple units of plasma.  Cardiology consulted 07/05/18 due to bradycardia thought to be from his newly started blood thinner.  Pt with another re-accumulation of epidural hematoma with increased L sided weakness and cord compression with emergent evacuation surgery 10/25 & 07/10/18.  Pt also needing more platelets post op.  Bone marrow bx 07/12/18.  Pt found to have submassive PE on 07/27/18 and re-started on anticoagulation. PMHx of HTN, gout, anemia, DDD (lumbar).  08/10/18 found to have a L UE thrombus (same arm as his PICC line).      PT Comments    Assisted pt OOB to recliner chair with pressure relief cushions in place.  Positioned for comfort and maximal access to his table, call bell, and cell phone.  He was better able to control his upper trunk today in supported sitting in the recliner chair.  He reports he has been practicing his neck ROM exercises and scapular exercises given  to him last session.  I am hopeful for EOB practice with OT on Monday.     Follow Up Recommendations  SNF     Equipment Recommendations  Hospital bed;Wheelchair (measurements PT);Wheelchair cushion (measurements PT);Other (comment)(22x20 tilt in space power chair, roho cushion, hoyer lift)    Recommendations for Other Services   NA     Precautions / Restrictions Precautions Precautions: Fall;Cervical;Other (comment) Precaution Comments: Several bed sores on bottom.   Required Braces or Orthoses: Cervical Brace Cervical Brace: Soft collar    Mobility  Bed Mobility Overal bed mobility: Needs Assistance Bed Mobility: Rolling Rolling: +2 for physical assistance;Total assist Sidelying to sit: +2 for physical assistance;Total assist       General bed mobility comments: Assisted pt in rolling bil for placement of lift pad.  Continue to encourage him to use his head and upper shoulder to help with rolling.   Transfers Overall transfer level: Needs assistance               General transfer comment: total lift used to get pt positioned OOB in recliner chair.  Pt's chair padded with pillows, geo mat and egg crate cushioning to help with pressure relief while up. We continued to discuss the importance of him not sitting fully upright for long periods of time and it puts pressure on his wound on his sit bones.              Cognition Arousal/Alertness: Awake/alert Behavior During Therapy: WFL for tasks assessed/performed Overall Cognitive Status: Within Functional  Limits for tasks assessed                                               Pertinent Vitals/Pain Pain Assessment: No/denies pain Pain Score: 0-No pain           PT Goals (current goals can now be found in the care plan section) Acute Rehab PT Goals Patient Stated Goal: to get his medical conditions figured out, heal up his bottom Progress towards PT goals: Progressing toward goals     Frequency    Min 2X/week      PT Plan Current plan remains appropriate       AM-PAC PT "6 Clicks" Mobility   Outcome Measure  Help needed turning from your back to your side while in a flat bed without using bedrails?: Total Help needed moving from lying on your back to sitting on the side of a flat bed without using bedrails?: Total Help needed moving to and from a bed to a chair (including a wheelchair)?: Total Help needed standing up from a chair using your arms (e.g., wheelchair or bedside chair)?: Total Help needed to walk in hospital room?: Total Help needed climbing 3-5 steps with a railing? : Total 6 Click Score: 6    End of Session Equipment Utilized During Treatment: Cervical collar;Other (comment)(soft collar) Activity Tolerance: Patient tolerated treatment well Patient left: in chair;with call bell/phone within reach   PT Visit Diagnosis: Other abnormalities of gait and mobility (R26.89);Other symptoms and signs involving the nervous system (R29.898);Muscle weakness (generalized) (M62.81)     Time: 1610-9604 PT Time Calculation (min) (ACUTE ONLY): 26 min  Charges:  $Therapeutic Activity: 23-37 mins                    Eliette Drumwright B. Florella Mcneese, PT, DPT  Acute Rehabilitation [phone] pager #[phone] office   08/11/2018, 5:25 PM

## 2018-08-11 NOTE — Progress Notes (Signed)
ANTICOAGULATION CONSULT NOTE   Pharmacy Consult for Argatroban  Indication: pulmonary embolus and bilateral DVT   Patient Measurements: Height: 5\' 9"  (175.3 cm) Weight: 281 lb 4.9 oz (127.6 kg) IBW/kg (Calculated) : 70.7 Bivalirudin Dosing Weight: 127.6 kg  Vital Signs: Temp: 98.4 F (36.9 C) (11/29 0733) Temp Source: Oral (11/29 0733) BP: 115/78 (11/29 0733) Pulse Rate: 97 (11/29 0733)  Labs: Recent Labs    08/09/18 0656 08/10/18 0408 08/11/18 0639  HGB 8.0* 9.1* 8.9*  HCT 26.6* 30.0* 29.1*  PLT 172 149* 149*  APTT 66* 70* 63*  CREATININE  --   --  0.58*    Assessment: 52 yr old male on Eliquis PTA for hx of PE and DVTs. Has IVC filter. Has had multiple events of bleeding over the last couple months. HIT is negative but started on Bivalirudin on 11/25. Found to have new thrombus on 11/29 and heme/onc wants to transition to Argatroban for anticoagulation treatment. Hgb 8.9, PLTc 149.    Goal of Therapy:  APTT 50-90 seconds Monitor platelets by anticoagulation protocol: Yes   Plan:  1. Begin Argatroban 521mcg/kg/min (7.6 ml/hr) 2. Check aPTT in 2 hours  3. Monitor daily aPTT, CBC, s/s of bleed 4. Await long term plan on anticoagulation   Pollyann SamplesAndy Fedrick Cefalu, PharmD, BCPS 08/11/2018, 8:15 AM

## 2018-08-11 NOTE — Progress Notes (Signed)
08/11/18 1727  PT Visit Information  Last PT Received On 08/11/18  Assistance Needed +2  History of Present Illness 52 y.o. male admitted on 06/06/18 for bil LE weakness and muscle spasm after multiple falls (with resultant R knee meniscus tear placed in bledsoe brace) following a recent ACDF at [facility] in July 2019 (complicated by PE (s/p IVC filter), CIR, and then SNF placement before finally returning home with his parents).  In the ED MRI of c-spine which showed post operative changes and cord edema and possibility of ischemia.  His R knee was aspirated on 06/09/18.  S/p posterior cervical decompression and fusion on 06/14/18. Entire acute course complicated by hypotension in standing (thought to be autonomic) and by finding of multiple LE DVTs (already has an IVC filter in place, but per MDs notes at risk for blocking the filter with these clots).  Pt with post op issues with incisional bleeding s/p 2 evacuations of hematomas and placement of hemovac (06/22/18 and 07/01/18) as well as multiple units of plasma.  Cardiology consulted 07/05/18 due to bradycardia thought to be from his newly started blood thinner.  Pt with another re-accumulation of epidural hematoma with increased L sided weakness and cord compression with emergent evacuation surgery 10/25 & 07/10/18.  Pt also needing more platelets post op.  Bone marrow bx 07/12/18.  Pt found to have submassive PE on 07/27/18 and re-started on anticoagulation. PMHx of HTN, gout, anemia, DDD (lumbar).  08/10/18 found to have a L UE thrombus (same arm as his PICC line).    Subjective Data  Patient Stated Goal to get his medical conditions figured out, heal up his bottom  Precautions  Precautions Fall;Cervical;Other (comment)  Precaution Comments Several bed sores on bottom.    Required Braces or Orthoses Cervical Brace  Cervical Brace Soft collar  Pain Assessment  Pain Assessment No/denies pain  Pain Score 0  Cognition  Arousal/Alertness Awake/alert   Behavior During Therapy WFL for tasks assessed/performed  Overall Cognitive Status Within Functional Limits for tasks assessed  Transfers  Overall transfer level Needs assistance  Transfer via Lift Equipment Maximove  General transfer comment total lift used to get pt back from the chair to the bed,    General Comments  General comments (skin integrity, edema, etc.) Pt in better spirits now that he has been up OOB.  PT came back as I did not want him up too long re: previous discussions of pressure on his bottom.  He even directed [place] to position him on his side using foam wedges.    PT - End of Session  Equipment Utilized During Treatment Cervical collar;Other (comment) (soft collar)  Activity Tolerance Patient tolerated treatment well  Patient left with call bell/phone within reach;in bed   PT - Assessment/Plan  PT Plan Current plan remains appropriate  PT Visit Diagnosis Other abnormalities of gait and mobility (R26.89);Other symptoms and signs involving the nervous system (R29.898);Muscle weakness (generalized) (M62.81)  PT Frequency (ACUTE ONLY) Min 2X/week  Follow Up Recommendations SNF  PT equipment Hospital bed;Wheelchair (measurements PT);Wheelchair cushion (measurements PT);Other (comment) (22x20 tilt in space power chair, roho cushion, hoyer lift)  AM-PAC PT "6 Clicks" Mobility Outcome Measure (Version 2)  Help needed turning from your back to your side while in a flat bed without using bedrails? 1  Help needed moving from lying on your back to sitting on the side of a flat bed without using bedrails? 1  Help needed moving to and from a bed to  a chair (including a wheelchair)? 1  Help needed standing up from a chair using your arms (e.g., wheelchair or bedside chair)? 1  Help needed to walk in hospital room? 1  Help needed climbing 3-5 steps with a railing?  1  6 Click Score 6  Consider Recommendation of Discharge To: CIR/SNF/LTACH  PT Goal Progression  Progress towards PT  goals Progressing toward goals (goal update due next session)  PT Time Calculation  PT Start Time (ACUTE ONLY) 1538  PT Stop Time (ACUTE ONLY) 1600  PT Time Calculation (min) (ACUTE ONLY) 22 min  PT General Charges  $$ ACUTE PT VISIT 1 Visit  PT Treatments  $Therapeutic Activity 8-22 mins  08/11/2018 PT returned to ensure that pt did not sit up too long in the chair before returning to bed as he continues to have active ischial tuberosity pressure sores.  Per RN tech report, since they changed him to a flexiseal his wounds have looked better.  He is self directing his pressure relief in bed, but continues to want to be more upright (still putting pressure on his ischium).  PT plans to work in conjunction with OT on Monday 08/14/18 for EOB mobility.  PT will continue to follow acutely for safe mobility progression Marlos Carmen B. Abad Manard, PT, DPT  Acute Rehabilitation [phone] pager [phone] office

## 2018-08-11 NOTE — Progress Notes (Signed)
PROGRESS NOTE    [name] [name]  OEV:[id number] DOB: 03-06-1966 DOA: 06/06/2018 PCP: Helane Rima, MD    Brief Narrative: 52/M with PMH of gout, hypertension, underwent anterior cervical corpectomy of C4 on 03/24/2018 with comp gated postoperative course including development of multiple PEs and DVTs started on anticoagulation and subsequently developed hematoma requiring reexploration on 03/28/2018 status post placement of IVC filter who was discharged to skilled nursing facility and had a complicated course including progressive lower extremity weakness, intermittent fevers of unclear etiology and hospitalizations for "pneumonia". Patient was admitted to this hospital as a transfer from [facility] with MRI of C-spine that showed postoperative changes and cord compression with cord edema and concern for ischemia. -status post posterior cervical decompression laminectomy of C3, C4, C5, removal of spinous processes, foraminotomies on 06/14/2018.  -On 06/22/2018 patient began to develop worsening left sided paresthesias and weakness and a stat MRI showed evidence of postoperative hematoma. Patient was taken to the OR with evacuation of hematoma.  -Additionally developed Bilateral lower extremity DVTs -on 10/9 was noted to have decreased left hand grip on 10/19, MRI showed large epidural hematoma, patient s/p clot evacuation.  He was placed back on anticoagulation argatroban on 10-22. He was notice to be more bradycardic with HR in the 40 on 10-23. Cardiology was consulted, and argatroban was transition to bivalirrubin to avoid bradycardia. His HR improved after argatroban was discontinued. -HIT panel0.486 (mildly elevated)- SRA (serotonin release assay) negative. Dr Marin Olp has been helping with patient care.  -On 10/25; developed weakness of left arm, He was take emergently to OR for recurrence of epidural hematoma on 10-25.  He was taken off of anticoagulation.   -10/28 Night: developed  left arm weakness, numbness. Stat MRI was ordered, which confirmed recurrent hematoma. Patient was taken to OR for hematoma evacuation 10-28.  -For his thrombocytopenia, Heme Dr.Ennever following, Bone marrow biopsy was done. For LE DVT, he has IVC filter. Patient has received multiple platelet transfusions and Nplate.  Patient underwent CT scan of his chest on 11/13 which showed new pulmonary embolism.  Patient was started on bivalirudin after discussions between hematology as well as neurosurgery.  Platelet counts have improved.  On 11/22 pt was found to have free intraabdominal air, it was followed with CT abd pelvis , shows possible stranding at the greater curvature of the stomach.  Surgery consulted and ordered upper GI series which did not show obstruction.  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  -Complicated postop course -Status post cervical decompressive surgery on 06/14/2018 -Status post evacuation of large hematoma and placement of Hemovac drain on 06/22/2018. -After this patient had multiple more clot evacuation procedures on 10/19, 10/25, 10/28  -Repeat MRI on 07/23/2018 shows improvement in the fluid collection.  Neurosurgery continues to follow -now stable per NSG from spine standpoint -Needs aggressive rehab  Acute pulmonary embolism/DVT in the setting of spine surgery Status post IVC filter placement on March 28, 2018 patient has been on multiple anticoagulants which has led to recurrent episodes of hematomas at the surgical sites requiring further evacuations.  Case was discussed with vascular surgery Dr. Lonni Fix, interventional radiologist they recommended that mechanical thrombectomy and thrombolysis is not on the cards.  He was kept off anticoagulation for a brief period, due to recurrent  bleeding/hematomas -However CT chest on 111/13 showed new pulmonary  embolism with evidence of right heart.  He was subsequently started on bivalrudin, platelet counts is improving.   -Then subsequently due to pneumoperitoneum,  Eliquis held 11/24, Angiomax started 11/25 by oncology in case Surgery needed, upper GI series came back negative for GI perforation.  surgery started advancing diet.  -11/28 with worsening left arm swelling, Dopplers noted acute DVT in left subclavian axillary, brachial and radial veins, and superficial thrombus in left cephalic and basilic veins -Needs PICC line out, discussed with IV team extensively they are unable to place peripherals in him, not appropriate for a central line in the setting of a fever of 102 this morning, plan to continue Angiomax via Picc today with antibiotics, if blood cultures are negative in 24 hours will have radiology place central line tomorrow 11/30 -Oncology following, bivalirudin switched to argatroban this morning, unfortunately this is a continuous infusion  Fever 11/28-11/29 -Suspect this is related to extensive left upper extremity DVTs, anticoagulation switched over to her gastric band today -See above challenges regarding access issues, especially in the setting of fever of 102 today -If blood cultures are negative for 24 hours will place a central line in radiology and then remove PICC line tomorrow 11/30  Pneumoperitoneum -Incidentally noted to have pneumoperitoneum on 11/24 -General surgery consulted -CT abdomen pelvis 11/24 with soft tissue stranding adjacent to the greater curvature of the body of the stomach, upper GI series on 1125 was negative for leak/perforation -At this time remains afebrile on broad-spectrum antibiotics, tolerating diet and having bowel movements -Suspect he might have had a microperforation which has sealed off -Complete a 10-day course of antibiotics, continue PPI for stress ulcer prophylaxis -remains on  SQ Bivalirudin  Thrombocytopenia/coagulopathy Patient had extensive work-up for acute thrombocytopenia.   cardiolipin antibody lupus anticoagulant, homocysteine levels factor VIII levels all within normal limits.  Protein S activity was low, protein C activity was greater than 200.  HIT panel was mildly elevated but serotonin release assay was negative.  Over the course of his hospitalization patient had multiple platelet transfusions and his platelet count has slowly increased with endplate infusions on weekly basis.   -Oncology following. -thrombocytopenia has resolved  Hyperglycemia secondary to steroids Currently on Lantus and sliding scale insulin. Hemoglobin A1c was 5.8 when last checked.  Anemia due to acute illness -Stable, improving  Sinus bradycardia - Is currently asymptomatic, TSH was within normal limits.  - Plan to avoid AV nodal blocking agents.  Fluid overload with upper and lower extremity edema  -Third spacing patient has diuresed well with IV lasix -Now held  Adjustment disorder with depression and anxiety Resume Klonopin as needed.  Essential hypertension  -well controlled  Acute renal failure Resolved  Stage II pressure ulcer on the right ischial tuberosity not present on admission  wound care consulted and recommendations given.  Oropharyngeal candidiasis -Completed course of fluconazole.  Acute diarrhea Resolved   DVT prophylaxis: Argitraban Code Status:  Full code Family Communication: no family at bedside.  Disposition Plan: Will need prolonged rehab   Consultants:   NS Dr Saintclair Halsted  Hematology  Dr Marin Olp.   Cardiology.   Surgery Dr Donne Hazel.    Procedures:  On 10/2 1. posterior cervical decompressive laminectomy at C3, C4, C5 with removal of the spinous process and complete laminectomy and foraminotomies of the C3, C4, C5 nerve roots. 2. Posterior cervical fusion with lateral mass screws at C3-C4-C5 and pars screws at C2 lies in  the globus ellipseLateral mass screw system 3. Posterior lateral  arthrodesis C2-C5 utilizing locally harvested autograft mixed with vivigen  06/22/2018: Procedure reexploration posterior cervical wound for evacuation of epidural hematoma  06/26/2018 DVT ultrasound:Right: Findings consistent with acute deep vein thrombosis involving the right common femoral vein, right femoral vein, right popliteal vein, right posterior tibial vein, and right peroneal vein. Thrombosis extends proximally into external iliac vein.  Proximal iliac vein not visualized. Left: Findings consistent with acute deep vein thrombosis involving the left common femoral vein, left femoral vein, left popliteal vein, left posterior tibial vein, and left peroneal vein. Findings consistent with acute superficial vein thrombosis  involving the left great saphenous vein. Thrombosis extends proximally into external iliac vein. Proximal iliac vein not visualized.  07/01/18 Clot evacuation   RUE doppler 07/02/18 Antimicrobials: vancomycin and zosyn since 11/23 for sepsis and abdominal infection.   Subjective: -fever of 102 overnight -no resp or abd symptoms  Objective: Vitals:   08/11/18 0400 08/11/18 0733 08/11/18 0900 08/11/18 1113  BP:  115/78  133/84  Pulse:  97  93  Resp:  16  16  Temp: (!) 102.6 F (39.2 C) 98.4 F (36.9 C)  98.4 F (36.9 C)  TempSrc: Oral Oral  Oral  SpO2:  95% 97%   Weight:      Height:        Intake/Output Summary (Last 24 hours) at 08/11/2018 1148 Last data filed at 08/11/2018 3491 Gross per 24 hour  Intake 980 ml  Output 900 ml  Net 80 ml   Filed Weights   07/07/18 1000 07/18/18 0600 07/27/18 1502  Weight: 123.8 kg 135 kg 127.6 kg    Examination: Gen: Obese chronically ill-appearing male, sitting up in bed, no distress HEENT: PERRLA, Neck supple, no JVD Lungs: Decreased breath sounds at bases CVS: RRR,No Gallops,Rubs or new Murmurs Abd: soft, Non tender, non distended,  BS present Extremities: Left upper extremity with PICC line and swelling noted, swelling and mild tenderness of the left ankle joint noted as well Neuro: awake alert, oriented x4, right upper extremity strength is 5 x 5, left upper extremity has diminished proximal muscle strength and 4+ at the wrist -Able to wiggle his toes on the right foot, no movements noted in the left leg PSYCH: depressed, flat affect  Data Reviewed: I have personally reviewed following labs and imaging studies  CBC: Recent Labs  Lab 08/07/18 0500 08/08/18 0955 08/09/18 0656 08/10/18 0408 08/11/18 0639  WBC 7.1 9.4 10.1 9.9 9.8  HGB 7.8* 9.9* 8.0* 9.1* 8.9*  HCT 25.7* 32.3* 26.6* 30.0* 29.1*  MCV 98.8 94.2 96.7 95.8 96.7  PLT 180 177 172 149* 791*   Basic Metabolic Panel: Recent Labs  Lab 08/05/18 0544 08/06/18 0615 08/07/18 0500 08/08/18 0545 08/11/18 0639  NA 136 136 138 137 140  K 3.9 3.4* 3.3* 2.6* 3.8  CL 98 98 102 103 111  CO2 '26 30 30 27 24  ' GLUCOSE 247* 152* 80 75 109*  BUN 26* 22* '15 13 11  ' CREATININE 0.92 0.80 0.76 0.62 0.58*  CALCIUM 8.3* 8.2* 8.5* 8.4* 8.7*   GFR: Estimated Creatinine Clearance: 142.8 mL/min (A) (by C-G formula based on SCr of 0.58 mg/dL (L)). Liver Function Tests: Recent Labs  Lab 08/05/18 0544 08/06/18 0615 08/07/18 0500  AST 47* 39 37  ALT 33 27 24  ALKPHOS 77 68 64  BILITOT 0.6 0.6 0.8  PROT 5.5* 5.1* 5.2*  ALBUMIN 2.7* 2.3* 2.3*   No results for input(s): LIPASE, AMYLASE in the last 168 hours. No results  for input(s): AMMONIA in the last 168 hours. Coagulation Profile: No results for input(s): INR, PROTIME in the last 168 hours. Cardiac Enzymes: No results for input(s): CKTOTAL, CKMB, CKMBINDEX, TROPONINI in the last 168 hours. BNP (last 3 results) No results for input(s): PROBNP in the last 8760 hours. HbA1C: No results for input(s): HGBA1C in the last 72 hours. CBG: Recent Labs  Lab 08/10/18 1203 08/10/18 1656 08/10/18 2102 08/11/18 0735  08/11/18 1116  GLUCAP 105* 118* 134* 92 68*   Lipid Profile: No results for input(s): CHOL, HDL, LDLCALC, TRIG, CHOLHDL, LDLDIRECT in the last 72 hours. Thyroid Function Tests: No results for input(s): TSH, T4TOTAL, FREET4, T3FREE, THYROIDAB in the last 72 hours. Anemia Panel: No results for input(s): VITAMINB12, FOLATE, FERRITIN, TIBC, IRON, RETICCTPCT in the last 72 hours. Sepsis Labs: Recent Labs  Lab 08/05/18 1141 08/05/18 1812 08/06/18 1802  LATICACIDVEN 5.0* 3.0* 2.3*    Recent Results (from the past 240 hour(s))  Culture, blood (Routine X 2) w Reflex to ID Panel     Status: None   Collection Time: 08/05/18  9:40 AM  Result Value Ref Range Status   Specimen Description BLOOD RIGHT ANTECUBITAL  Final   Special Requests   Final    BOTTLES DRAWN AEROBIC ONLY Blood Culture adequate volume   Culture   Final    NO GROWTH 5 DAYS Performed at [facility], [place] [address], [place], [place] [address]    Report Status 08/10/2018 FINAL  Final  Culture, blood (Routine X 2) w Reflex to ID Panel     Status: None   Collection Time: 08/05/18  9:47 AM  Result Value Ref Range Status   Specimen Description BLOOD RIGHT ANTECUBITAL  Final   Special Requests   Final    BOTTLES DRAWN AEROBIC ONLY Blood Culture adequate volume   Culture   Final    NO GROWTH 5 DAYS Performed at [facility], [place] [address] [place], [place] [address]    Report Status 08/10/2018 FINAL  Final         Radiology Studies: No results found.      Scheduled Meds: . sodium chloride   Intravenous Once  . Chlorhexidine Gluconate Cloth  6 each Topical Q0600  . clonazePAM  0.5 mg Oral BID  . famotidine  20 mg Oral Daily  . feeding supplement (ENSURE ENLIVE)  237 mL Oral BID BM  . gabapentin  300 mg Oral TID  . Gerhardt's butt cream   Topical BID  . Influenza vac split quadrivalent PF  0.5 mL Intramuscular Tomorrow-1000  . insulin aspart  0-15 Units Subcutaneous TID WC  . insulin aspart   0-5 Units Subcutaneous QHS  . insulin glargine  20 Units Subcutaneous QHS  . ipratropium-albuterol  3 mL Nebulization BID  . lipase/protease/amylase  36,000 Units Oral TID AC  . magic mouthwash w/lidocaine  5 mL Oral TID  . methocarbamol  500 mg Oral QID  . midodrine  5 mg Oral BID WC  . multivitamin with minerals  1 tablet Oral Daily  . saccharomyces boulardii  250 mg Oral BID  . sodium chloride flush  10-40 mL Intracatheter Q12H   Continuous Infusions: . sodium chloride 10 mL (08/11/18 0507)  . argatroban 1 mcg/kg/min (08/11/18 0919)  . ceFEPime (MAXIPIME) IV 1 g (08/11/18 0508)     LOS: 6 days   Domenic Polite, MD [facility] Page via [email or web address]  If 7PM-7AM, please contact night-coverage [email or web address] Password [facility] 08/11/2018,  11:48 AM

## 2018-08-12 ENCOUNTER — Inpatient Hospital Stay (HOSPITAL_COMMUNITY)

## 2018-08-12 DIAGNOSIS — Z789 Other specified health status: Secondary | ICD-10-CM

## 2018-08-12 LAB — BASIC METABOLIC PANEL
Anion gap: 9 (ref 5–15)
BUN: 14 mg/dL (ref 6–20)
CO2: 24 mmol/L (ref 22–32)
Calcium: 8.8 mg/dL — ABNORMAL LOW (ref 8.9–10.3)
Chloride: 106 mmol/L (ref 98–111)
Creatinine, Ser: 0.62 mg/dL (ref 0.61–1.24)
GFR calc non Af Amer: >60 mL/min (ref >60)
Glucose, Bld: 103 mg/dL — ABNORMAL HIGH (ref 70–99)
Potassium: 3.9 mmol/L (ref 3.5–5.1)
Sodium: 139 mmol/L (ref 135–145)

## 2018-08-12 LAB — CBC
HEMATOCRIT: 25.8 % — AB (ref 39.0–52.0)
Hemoglobin: 7.6 g/dL — ABNORMAL LOW (ref 13.0–17.0)
MCH: 29 pg (ref 26.0–34.0)
MCHC: 29.5 g/dL — ABNORMAL LOW (ref 30.0–36.0)
MCV: 98.5 fL (ref 80.0–100.0)
Platelets: 183 10*3/uL (ref 150–400)
RBC: 2.62 MIL/uL — ABNORMAL LOW (ref 4.22–5.81)
RDW: 20.6 % — ABNORMAL HIGH (ref 11.5–15.5)
WBC: 11.8 10*3/uL — ABNORMAL HIGH (ref 4.0–10.5)
nRBC: 2 % — ABNORMAL HIGH (ref 0.0–0.2)

## 2018-08-12 LAB — GLUCOSE, CAPILLARY
GLUCOSE-CAPILLARY: 100 mg/dL — AB (ref 70–99)
GLUCOSE-CAPILLARY: 161 mg/dL — AB (ref 70–99)
Glucose-Capillary: 90 mg/dL (ref 70–99)
Glucose-Capillary: 124 mg/dL — ABNORMAL HIGH (ref 70–99)

## 2018-08-12 LAB — APTT: aPTT: 65 seconds — ABNORMAL HIGH (ref 24–36)

## 2018-08-12 NOTE — Procedures (Signed)
Central Venous Catheter Insertion Procedure Note [name] FreedCornell [name] [id number] 1966/08/23  Procedure: Insertion of Central Venous Catheter Indications: Drug and/or fluid administration and Frequent blood sampling  Procedure Details Consent: Risks of procedure as well as the alternatives and risks of each were explained to the (patient/caregiver).  Consent for procedure obtained. Time Out: Verified patient identification, verified procedure, site/side was marked, verified correct patient position, special equipment/implants available, medications/allergies/relevent history reviewed, required imaging and test results available.  Performed Real time US used to ID and cannulate vessel   Maximum sterile technique was used including antiseptics, cap, gloves, gown, hand hygiene, mask and sheet. Skin prep: Chlorhexidine; local anesthetic administered A antimicrobial bonded/coated triple lumen catheter was placed in the right internal jugular vein using the Seldinger technique.  Evaluation Blood flow good Complications: No apparent complications Patient did tolerate procedure well. Chest X-ray ordered to verify placement.  CXR: pending.  [name] [name] Staff 08/12/2018, 2:25 PM  [name] MartinetPeter E [name] [name] ACNP-BC [facility] Pulmonary/Critical Care Pager # [phone] OR # [phone] if no answer

## 2018-08-12 NOTE — Progress Notes (Signed)
PICC line removed per order.  Site with vazeline gauze and gauze pressure dressing clean, dry, intact. Patient aware to stay in bed for 30 minutes until 1830.  Also aware to leave dressing on for 24 hours and not get it wet.

## 2018-08-12 NOTE — Progress Notes (Signed)
ANTICOAGULATION CONSULT NOTE   Pharmacy Consult for Argatroban  Indication: pulmonary embolus and bilateral DVT   Patient Measurements: Height: 5\' 9"  (175.3 cm) Weight: 281 lb 4.9 oz (127.6 kg) IBW/kg (Calculated) : 70.7 Bivalirudin Dosing Weight: 127.6 kg  Vital Signs: Temp: 98.9 F (37.2 C) (11/30 0725) Temp Source: Oral (11/30 0725) BP: 114/78 (11/30 0725) Pulse Rate: 100 (11/30 0725)  Labs: Recent Labs    08/10/18 0408 08/11/18 0639 08/11/18 1239 08/11/18 2155 08/12/18 0320  HGB 9.1* 8.9*  --   --  7.6*  HCT 30.0* 29.1*  --   --  25.8*  PLT 149* 149*  --   --  183  APTT 70* 63* 66* 59* 65*  CREATININE  --  0.58*  --   --  0.62    Assessment: 52 yr old male on Eliquis PTA for hx of PE and DVTs. Has IVC filter. Has had multiple events of bleeding over the last couple months. HIT is negative but started on Bivalirudin on 11/25. Found to have new thrombus on 11/29 and heme/onc recommended to transition to argatroban for anticoagulation  aPTT remains therapeutic 65 sec.   Hgb dropped from 8.9 to 7.6 today. No bleeding noted per RN and MD aware. Platelet count improved from 149 to 183.  Goal of Therapy:  aPTT 50-90 seconds Monitor platelets by anticoagulation protocol: Yes   Plan:  1. Continue argatroban IV 371mcg/kg/min (7.6 ml/hr) 2. Monitor daily aPTT, CBC, s/s of bleed 3. Await long term plan on anticoagulation   Danae OrleansMegan McCarthy, PharmD PGY1 Pharmacy Resident Phone [phone] 08/12/2018       8:57 AM

## 2018-08-12 NOTE — Progress Notes (Addendum)
RN attempted to call IR [phone] in regards to order for R IJ placement. IV argatroban on hold at this time. Called IR again 0825 with no answer. MD called RN to make aware PCCM MD will consult to place line.

## 2018-08-12 NOTE — Progress Notes (Addendum)
PROGRESS NOTE    [name] [name]  MVE:[id number] DOB: Jan 03, 1966 DOA: 06/06/2018 PCP: Helane Rima, MD    Brief Narrative: 52/M with PMH of gout, hypertension, underwent anterior cervical corpectomy of C4 on 2/83/6629 with complicated postoperative course including development of multiple PEs and DVTs started on anticoagulation and subsequently developed C spinal hematoma requiring reexploration on 03/28/2018 status post placement of IVC filter who was discharged to skilled nursing facility and had a complicated course including progressive lower extremity weakness, intermittent fevers of unclear etiology and hospitalizations for "pneumonia". Patient was admitted to this hospital as a transfer from [facility] with MRI of C-spine that showed postoperative changes and cord compression with cord edema and concern for ischemia. -status post posterior cervical decompression laminectomy of C3, C4, C5, removal of spinous processes, foraminotomies on 06/14/2018.  -On 06/22/2018 patient began to develop worsening left sided paresthesias and weakness and a stat MRI showed evidence of postoperative hematoma. Patient was taken to the OR with evacuation of hematoma.  -Additionally developed Bilateral lower extremity DVTs -on 10/19 was noted to have decreased left hand grip on 10/19, MRI showed large epidural hematoma, patient s/p clot evacuation.  He was placed back on anticoagulation argatroban on 10-22. He was notice to be more bradycardic with HR in the 40 on 10-23. Cardiology was consulted, and argatroban was transition to bivalirrubin to avoid bradycardia. His HR improved after argatroban was discontinued. -HIT panel0.486 (mildly elevated)- SRA (serotonin release assay) negative. Dr Marin Olp following closely -On 10/25; developed weakness of left arm, He was take emergently to OR for recurrence of epidural hematoma on 10-25.  He was taken off of anticoagulation.   -10/28 Night: developed left  arm weakness, numbness. Stat MRI was ordered, which confirmed recurrent hematoma. Patient was taken to OR for hematoma evacuation 10-28.  -For his thrombocytopenia, Heme Dr.Ennever following, Bone marrow biopsy was done. For LE DVT, he has IVC filter. Patient has received multiple platelet transfusions and Nplate.  Patient underwent CT scan of his chest on 11/13 which showed new pulmonary embolism.  Patient was started on bivalirudin after discussions between hematology as well as neurosurgery.  Platelet counts have improved.  On 11/22 pt was found to have free intraabdominal air, it was followed with CT abd pelvis , shows possible stranding at the greater curvature of the stomach.  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  -Complicated postop course -Status post cervical decompressive surgery on 06/14/2018 -Status post evacuation of large hematoma and placement of Hemovac drain on 06/22/2018. -After this patient had multiple more clot evacuation procedures on 10/19, 10/25, 10/28  -Repeat MRI on 07/23/2018 shows improvement in the fluid collection.  Neurosurgery continues to follow -now stable per NSG from spine standpoint -Needs aggressive rehab, plan for SNF when stable  Acute pulmonary embolism/DVT in the setting of spine surgery Status post IVC filter placement on March 28, 2018 patient has been on multiple anticoagulants which has led to recurrent episodes of hematomas at the surgical sites requiring further evacuations.   -He was kept off anticoagulation for a brief period, due to recurrent bleeding/hematomas -However CT chest on 11/13 showed new pulmonary embolism with  evidence of right heart.  He was subsequently started on bivalrudin, platelet counts are improving.   -Then subsequently due to pneumoperitoneum,  Eliquis held 11/24, Angiomax started 11/25 by oncology in case Surgery was needed, upper GI series came back negative for GI perforation.  surgery started advancing diet, now signed off.  -11/28 with worsening left arm swelling, Dopplers noted acute DVT in left subclavian axillary, brachial and radial veins, and superficial thrombus in left cephalic and basilic veins -Needs PICC line out, discussed with IV team 11/29 extensively they were unable to place peripherals in him, due to high fevers, deferred placing a central line yesterday until blood cultures were negative for at least 24 hours, fever most likely secondary to extensive clots. -now cultures negative for 30hours -Initially IR and now PCCM consulted for central line placement ideally right IJ or subclavian -after this we will discontinue PICC line today -Bibvalrudin switched to [place] 11/29  -Resume Argatroban post central line this afternoon  Fever 11/28-11/30 -Suspect this is related to extensive left upper extremity DVTs, anticoagulation switched over to her Argatrban, now held for [employer] -See above challenges regarding access issues -Blood cultures negative for 30 hours -He remains on IV cefepime due to recent pneumoperitoneum, will continue this for at least 10-day course  Pneumoperitoneum -Incidentally noted to have pneumoperitoneum on 11/24 -General surgery consulted -CT abdomen pelvis 11/24 with soft tissue stranding adjacent to the greater curvature of the body of the stomach, upper GI series on 1125 was negative for leak/perforation -At this time remains afebrile on broad-spectrum antibiotics, tolerating diet and having bowel movements -Suspect he might have had a microperforation which has sealed off -Complete a 10-day course of antibiotics, continue PPI for stress ulcer  prophylaxis  Thrombocytopenia/coagulopathy Patient had extensive work-up for acute thrombocytopenia.   cardiolipin antibody lupus anticoagulant, homocysteine levels factor VIII levels all within normal limits.  Protein S activity was low, protein C activity was greater than 200.  HIT panel was mildly elevated but serotonin release assay was negative.  Over the course of his hospitalization patient had multiple platelet transfusions and his platelet count has slowly increased with endplate infusions on weekly basis.   -Oncology following. -thrombocytopenia has resolved  Hyperglycemia secondary to steroids Currently on Lantus and sliding scale insulin. Hemoglobin A1c was 5.8 .  Anemia due to acute illness -Stable, improving  Sinus bradycardia - Is currently asymptomatic, TSH was within normal limits.  - Plan to avoid AV nodal blocking agents.  Fluid overload with upper and lower extremity edema  -Third spacing patient has diuresed well with IV lasix -resume PO  Adjustment disorder with depression and anxiety Resume Klonopin as needed.  Essential hypertension  -well controlled  Acute renal failure Resolved  Stage II pressure ulcer on the right ischial tuberosity not present on admission  wound care consulted and recommendations given.  Oropharyngeal candidiasis -Completed course of fluconazole.  Acute diarrhea Resolved   DVT prophylaxis: Argitraban Code Status:  Full code Family Communication: no family at bedside.  Disposition Plan: Will need prolonged rehab   Consultants:   NS Dr Saintclair Halsted  Hematology  Dr Marin Olp.   Cardiology.   Surgery Dr Donne Hazel.    Procedures:  On 10/2 1. posterior cervical decompressive laminectomy at C3, C4, C5 with removal of the spinous process and complete laminectomy and foraminotomies of the C3, C4, C5 nerve roots. 2. Posterior cervical fusion with lateral mass screws at C3-C4-C5 and pars screws at C2 lies in the globus  ellipseLateral mass screw system 3. Posterior lateral  arthrodesis C2-C5 utilizing locally harvested autograft mixed with vivigen  06/22/2018: Procedure reexploration posterior cervical wound for evacuation of epidural hematoma  06/26/2018 DVT ultrasound:Right: Findings consistent with acute deep vein thrombosis involving the right common femoral vein, right femoral vein, right popliteal vein, right posterior tibial vein, and right peroneal vein. Thrombosis extends proximally into external iliac vein.  Proximal iliac vein not visualized. Left: Findings consistent with acute deep vein thrombosis involving the left common femoral vein, left femoral vein, left popliteal vein, left posterior tibial vein, and left peroneal vein. Findings consistent with acute superficial vein thrombosis  involving the left great saphenous vein. Thrombosis extends proximally into external iliac vein. Proximal iliac vein not visualized.  07/01/18 Clot evacuation   RUE doppler 07/02/18 Antimicrobials: vancomycin and zosyn since 11/23 for sepsis and abdominal infection.   Subjective: -Fever curve is better, went up to 10 1 in the evening yesterday -No respiratory symptoms, no nausea vomiting, tolerating diet  Objective: Vitals:   08/11/18 2300 08/12/18 0300 08/12/18 0725 08/12/18 1125  BP: 105/69 96/62 114/78 124/78  Pulse: (!) 112 (!) 102 100 (!) 105  Resp: 20 (!) 27 10 (!) 25  Temp: (!) 101.3 F (38.5 C) 98.7 F (37.1 C) 98.9 F (37.2 C) 99.6 F (37.6 C)  TempSrc: Oral Oral Oral Oral  SpO2: 95% 91% 96% 95%  Weight:      Height:        Intake/Output Summary (Last 24 hours) at 08/12/2018 1334 Last data filed at 08/12/2018 1200 Gross per 24 hour  Intake 1449.03 ml  Output 1300 ml  Net 149.03 ml   Filed Weights   07/07/18 1000 07/18/18 0600 07/27/18 1502  Weight: 123.8 kg 135 kg 127.6 kg    Examination: Gen: Awake, Alert, Oriented X 3, obese, chronically ill-appearing male sitting in  bed HEENT: PERRLA, Neck supple, no JVD Lungs: Decreased breath sounds at both bases CVS: RRR,No Gallops,Rubs or new Murmurs Abd: soft, Non tender, non distended, BS present Extremities: 1+ bilateral lower extremity edema, left upper extremity with PICC line and swelling, mild swelling and tenderness of left ankle Neuro: awake alert, oriented x4, right upper extremity strength is 5 x 5, left upper extremity has diminished proximal muscle strength and 4+ at the wrist -Able to wiggle his toes on the right foot, no movements noted in the left leg  Data Reviewed: I have personally reviewed following labs and imaging studies  CBC: Recent Labs  Lab 08/08/18 0955 08/09/18 0656 08/10/18 0408 08/11/18 0639 08/12/18 0320  WBC 9.4 10.1 9.9 9.8 11.8*  HGB 9.9* 8.0* 9.1* 8.9* 7.6*  HCT 32.3* 26.6* 30.0* 29.1* 25.8*  MCV 94.2 96.7 95.8 96.7 98.5  PLT 177 172 149* 149* 341   Basic Metabolic Panel: Recent Labs  Lab 08/06/18 0615 08/07/18 0500 08/08/18 0545 08/11/18 0639 08/12/18 0320  NA 136 138 137 140 139  K 3.4* 3.3* 2.6* 3.8 3.9  CL 98 102 103 111 106  CO2 '30 30 27 24 24  ' GLUCOSE 152* 80 75 109* 103*  BUN 22* '15 13 11 14  ' CREATININE 0.80 0.76 0.62 0.58* 0.62  CALCIUM 8.2* 8.5* 8.4* 8.7* 8.8*   GFR: Estimated Creatinine Clearance: 142.8 mL/min (by C-G formula based on SCr of 0.62 mg/dL). Liver Function Tests: Recent Labs  Lab 08/06/18 0615 08/07/18 0500  AST 39 37  ALT 27 24  ALKPHOS 68 64  BILITOT 0.6 0.8  PROT 5.1* 5.2*  ALBUMIN 2.3* 2.3*   No results for input(s): LIPASE,  AMYLASE in the last 168 hours. No results for input(s): AMMONIA in the last 168 hours. Coagulation Profile: No results for input(s): INR, PROTIME in the last 168 hours. Cardiac Enzymes: No results for input(s): CKTOTAL, CKMB, CKMBINDEX, TROPONINI in the last 168 hours. BNP (last 3 results) No results for input(s): PROBNP in the last 8760 hours. HbA1C: No results for input(s): HGBA1C in the last  72 hours. CBG: Recent Labs  Lab 08/11/18 1116 08/11/18 1712 08/11/18 2115 08/12/18 0754 08/12/18 1125  GLUCAP 68* 92 93 90 124*   Lipid Profile: No results for input(s): CHOL, HDL, LDLCALC, TRIG, CHOLHDL, LDLDIRECT in the last 72 hours. Thyroid Function Tests: No results for input(s): TSH, T4TOTAL, FREET4, T3FREE, THYROIDAB in the last 72 hours. Anemia Panel: No results for input(s): VITAMINB12, FOLATE, FERRITIN, TIBC, IRON, RETICCTPCT in the last 72 hours. Sepsis Labs: Recent Labs  Lab 08/05/18 1812 08/06/18 1802  LATICACIDVEN 3.0* 2.3*    Recent Results (from the past 240 hour(s))  Culture, blood (Routine X 2) w Reflex to ID Panel     Status: None   Collection Time: 08/05/18  9:40 AM  Result Value Ref Range Status   Specimen Description BLOOD RIGHT ANTECUBITAL  Final   Special Requests   Final    BOTTLES DRAWN AEROBIC ONLY Blood Culture adequate volume   Culture   Final    NO GROWTH 5 DAYS Performed at [facility], [place] [address] [place], [place] [address]    Report Status 08/10/2018 FINAL  Final  Culture, blood (Routine X 2) w Reflex to ID Panel     Status: None   Collection Time: 08/05/18  9:47 AM  Result Value Ref Range Status   Specimen Description BLOOD RIGHT ANTECUBITAL  Final   Special Requests   Final    BOTTLES DRAWN AEROBIC ONLY Blood Culture adequate volume   Culture   Final    NO GROWTH 5 DAYS Performed at [facility], [place] [address] [place], [place] [address]    Report Status 08/10/2018 FINAL  Final  Culture, blood (routine x 2)     Status: None (Preliminary result)   Collection Time: 08/11/18  8:44 AM  Result Value Ref Range Status   Specimen Description BLOOD RIGHT ANTECUBITAL  Final   Special Requests   Final    BOTTLES DRAWN AEROBIC AND ANAEROBIC Blood Culture adequate volume   Culture   Final    NO GROWTH < 24 HOURS Performed at [facility], [place] [address], [place], [place] [address]    Report Status PENDING   Incomplete  Culture, blood (routine x 2)     Status: None (Preliminary result)   Collection Time: 08/11/18  8:44 AM  Result Value Ref Range Status   Specimen Description BLOOD RIGHT HAND  Final   Special Requests   Final    BOTTLES DRAWN AEROBIC ONLY Blood Culture adequate volume   Culture   Final    NO GROWTH < 24 HOURS Performed at [facility], [place] [address] [place],  [address]    Report Status PENDING  Incomplete         Radiology Studies: Vas [place] Upper Extremity Venous Duplex  Result Date: 08/11/2018 UPPER VENOUS STUDY  Indications: Edema PICC line. Extended hospitalization Limitations: Bandages, line and collar. Performing Technologist: Sharion Dove RVS  Examination Guidelines: A complete evaluation includes B-mode imaging, spectral Doppler, color Doppler, and power Doppler as needed of all accessible portions of  each vessel. Bilateral testing is considered an integral part of a complete examination. Limited examinations for reoccurring indications may be performed as noted.  Right Findings: +----------+------------+----------+---------+-----------+-------+ RIGHT     CompressiblePropertiesPhasicitySpontaneousSummary +----------+------------+----------+---------+-----------+-------+ Subclavian                         Yes       Yes            +----------+------------+----------+---------+-----------+-------+  Left Findings: +----------+------------+----------+---------+-----------+--------------+ LEFT      CompressiblePropertiesPhasicitySpontaneous   Summary     +----------+------------+----------+---------+-----------+--------------+ IJV                                                 not visualized +----------+------------+----------+---------+-----------+--------------+ Subclavian    None                 No        No         Acute      +----------+------------+----------+---------+-----------+--------------+ Axillary      None                                       Acute      +----------+------------+----------+---------+-----------+--------------+ Brachial    Partial                                     Acute      +----------+------------+----------+---------+-----------+--------------+ Radial        None                                      Acute      +----------+------------+----------+---------+-----------+--------------+ Ulnar         Full                                                 +----------+------------+----------+---------+-----------+--------------+ Cephalic      None                                      Acute      +----------+------------+----------+---------+-----------+--------------+ Basilic       None                                      Acute      +----------+------------+----------+---------+-----------+--------------+  Summary:  Left: Findings consistent with acute deep vein thrombosis involving the left subclavian veins, left axillary vein, left brachial veins and left radial veins. Findings consistent with acute superficial vein thrombosis involving the left basilic vein and left cephalic vein.  *See table(s) above for measurements and observations.  Diagnosing physician: Servando Snare MD Electronically signed by Servando Snare MD on 08/11/2018 at 3:43:38 PM.    Final         Scheduled Meds: . sodium chloride   Intravenous Once  .  Chlorhexidine Gluconate Cloth  6 each Topical Q0600  . clonazePAM  0.5 mg Oral BID  . famotidine  20 mg Oral Daily  . feeding supplement (ENSURE ENLIVE)  237 mL Oral BID BM  . gabapentin  300 mg Oral TID  . Gerhardt's butt cream   Topical BID  . Influenza vac split quadrivalent PF  0.5 mL Intramuscular Tomorrow-1000  . insulin aspart  0-15 Units Subcutaneous TID WC  . insulin aspart  0-5 Units Subcutaneous QHS  . insulin glargine  20 Units Subcutaneous QHS  . lipase/protease/amylase  36,000 Units Oral TID AC  . magic mouthwash w/lidocaine  5  mL Oral TID  . methocarbamol  500 mg Oral QID  . midodrine  5 mg Oral BID WC  . multivitamin with minerals  1 tablet Oral Daily  . saccharomyces boulardii  250 mg Oral BID  . sodium chloride flush  10-40 mL Intracatheter Q12H   Continuous Infusions: . sodium chloride 10 mL (08/11/18 0507)  . argatroban Stopped (08/12/18 0754)     LOS: 57 days   Domenic Polite, MD [facility] Page via [email or web address]  If 7PM-7AM, please contact night-coverage [email or web address] Password [email or web address] 08/12/2018, 1:34 PM

## 2018-08-12 NOTE — Progress Notes (Signed)
  Dr. Miles CostainShick recommends CCM to place central line.  Message sent to ordering MD via Amion.   Jalee Saine S Yehoshua Vitelli PA-C 08/12/2018 9:25 AM

## 2018-08-12 NOTE — Progress Notes (Signed)
ANTICOAGULATION CONSULT NOTE   Pharmacy Consult for Argatroban  Indication: pulmonary embolus and bilateral DVT   Patient Measurements: Height: 5\' 9"  (175.3 cm) Weight: 281 lb 4.9 oz (127.6 kg) IBW/kg (Calculated) : 70.7 Bivalirudin Dosing Weight: 127.6 kg  Vital Signs: Temp: 101.3 F (38.5 C) (11/29 2300) Temp Source: Oral (11/29 2300) BP: 105/69 (11/29 2300) Pulse Rate: 112 (11/29 2300)  Labs: Recent Labs    08/09/18 0656 08/10/18 0408 08/11/18 0639 08/11/18 1239 08/11/18 2155  HGB 8.0* 9.1* 8.9*  --   --   HCT 26.6* 30.0* 29.1*  --   --   PLT 172 149* 149*  --   --   APTT 66* 70* 63* 66* 59*  CREATININE  --   --  0.58*  --   --     Assessment: 52 yr old male on Eliquis PTA for hx of PE and DVTs. Has IVC filter. Has had multiple events of bleeding over the last couple months. HIT is negative but started on Bivalirudin on 11/25. Found to have new thrombus on 11/29 and heme/onc wants to transition to Argatroban for anticoagulation  APTT remains therapeutic 59 sec   Goal of Therapy:  APTT 50-90 seconds Monitor platelets by anticoagulation protocol: Yes   Plan:  1. Continue Argatroban 441mcg/kg/min (7.6 ml/hr 2. Monitor daily aPTT, CBC, s/s of bleed 3. Await long term plan on anticoagulation   Talbert CageLora Eupha Lobb, PharmD 08/12/2018, 12:56 AM

## 2018-08-13 DIAGNOSIS — R5081 Fever presenting with conditions classified elsewhere: Secondary | ICD-10-CM

## 2018-08-13 DIAGNOSIS — I824Y9 Acute embolism and thrombosis of unspecified deep veins of unspecified proximal lower extremity: Secondary | ICD-10-CM

## 2018-08-13 DIAGNOSIS — M4802 Spinal stenosis, cervical region: Secondary | ICD-10-CM

## 2018-08-13 LAB — CBC
HCT: 27.4 % — ABNORMAL LOW (ref 39.0–52.0)
Hemoglobin: 8.5 g/dL — ABNORMAL LOW (ref 13.0–17.0)
MCH: 30 pg (ref 26.0–34.0)
MCHC: 31 g/dL (ref 30.0–36.0)
MCV: 96.8 fL (ref 80.0–100.0)
NRBC: 1.2 % — AB (ref 0.0–0.2)
Platelets: 137 10*3/uL — ABNORMAL LOW (ref 150–400)
RBC: 2.83 MIL/uL — ABNORMAL LOW (ref 4.22–5.81)
RDW: 20.4 % — ABNORMAL HIGH (ref 11.5–15.5)
WBC: 10.7 10*3/uL — ABNORMAL HIGH (ref 4.0–10.5)

## 2018-08-13 LAB — GLUCOSE, CAPILLARY
GLUCOSE-CAPILLARY: 145 mg/dL — AB (ref 70–99)
Glucose-Capillary: 135 mg/dL — ABNORMAL HIGH (ref 70–99)
Glucose-Capillary: 115 mg/dL — ABNORMAL HIGH (ref 70–99)
Glucose-Capillary: 110 mg/dL — ABNORMAL HIGH (ref 70–99)

## 2018-08-13 LAB — APTT: aPTT: 76 seconds — ABNORMAL HIGH (ref 24–36)

## 2018-08-13 MED ORDER — FUROSEMIDE 10 MG/ML IJ SOLN
40.0000 mg | Freq: Two times a day (BID) | INTRAMUSCULAR | Status: DC
  Administered 2018-08-13 – 2018-08-14 (×2): 40 mg via INTRAVENOUS
  Filled 2018-08-13 (×2): qty 4

## 2018-08-13 MED ORDER — SODIUM CHLORIDE 0.9 % IV SOLN
1.0000 g | Freq: Three times a day (TID) | INTRAVENOUS | Status: AC
  Administered 2018-08-13 – 2018-08-14 (×5): 1 g via INTRAVENOUS
  Filled 2018-08-13 (×5): qty 1

## 2018-08-13 MED ORDER — POTASSIUM CHLORIDE CRYS ER 20 MEQ PO TBCR
40.0000 meq | EXTENDED_RELEASE_TABLET | Freq: Two times a day (BID) | ORAL | Status: DC
  Administered 2018-08-13 – 2018-08-14 (×3): 40 meq via ORAL
  Filled 2018-08-13 (×3): qty 2

## 2018-08-13 NOTE — Progress Notes (Signed)
ANTICOAGULATION CONSULT NOTE   Pharmacy Consult for Argatroban  Indication: pulmonary embolus and bilateral DVT   Patient Measurements: Height: 5\' 9"  (175.3 cm) Weight: 281 lb 4.9 oz (127.6 kg) IBW/kg (Calculated) : 70.7 Bivalirudin Dosing Weight: 127.6 kg  Vital Signs: Temp: 98.5 F (36.9 C) (12/01 0725) Temp Source: Oral (12/01 0247) BP: 131/82 (12/01 0725) Pulse Rate: 96 (12/01 0725)  Labs: Recent Labs    08/11/18 0639  08/11/18 2155 08/12/18 0320 08/13/18 0628  HGB 8.9*  --   --  7.6* 8.5*  HCT 29.1*  --   --  25.8* 27.4*  PLT 149*  --   --  183 137*  APTT 63*   < > 59* 65* 76*  CREATININE 0.58*  --   --  0.62  --    < > = values in this interval not displayed.    Assessment: 52 yr old male on Eliquis PTA for hx of PE and DVTs. Has IVC filter. Has had multiple events of bleeding over the last couple months. HIT is negative but started on Bivalirudin on 11/25. Found to have new thrombus on 11/29 and heme/onc recommended to transition to argatroban for anticoagulation  aPTT remains therapeutic 76 sec.   Hgb back up to 8.5. Platelet count decreased from 183 to 137 today.  Goal of Therapy:  aPTT 50-90 seconds Monitor platelets by anticoagulation protocol: Yes   Plan:  1. Continue argatroban IV 501mcg/kg/min (7.6 ml/hr) 2. Monitor daily aPTT, CBC, s/s of bleed 3. Await long term plan on anticoagulation    Danae OrleansMegan , PharmD PGY1 Pharmacy Resident Phone [phone] 08/13/2018       9:11 AM

## 2018-08-13 NOTE — Progress Notes (Signed)
PROGRESS NOTE    [name] [name]  ZOX:[id number] DOB: [birth year] DOA: 06/06/2018 PCP: Devra DoppHowell, [name], MD    Brief Narrative: 52/M with PMH of gout, hypertension, underwent anterior cervical corpectomy of C4 on 03/24/2018 with complicated postoperative course including development of multiple PEs and DVTs started on anticoagulation and subsequently developed C spinal hematoma requiring reexploration on 03/28/2018 status post placement of IVC filter who was discharged to skilled nursing facility -Patient [facility] of [facility]-spine that showed postoperative changes and cord compression with cord edema and concern for ischemia, s/p post posterior cervical decompression laminectomy of C3, C4, C5, removal of spinous processes, foraminotomies on 06/14/2018.  -On 06/22/2018 developed worsening left sided paresthesias and weakness MRI noted postoperative hematoma, was taken to the OR for evacuation of hematoma.  -Additionally developed Bilateral lower extremity DVTs -on 10/19 was noted to have decreased left hand grip on 10/19, MRI showed large epidural hematoma, patient s/p clot evacuation.  He was placed back on anticoagulation argatroban on 10-22. He was notice to be more bradycardic with HR in the 40 on 10-23. Cardiology was consulted, and argatroban was transition to bivalirrubin to avoid bradycardia. His HR improved after argatroban was discontinued. -HIT panel0.486 (mildly elevated)- SRA (serotonin release assay) negative. Dr Myna HidalgoEnnever following closely -On 10/25; developed weakness of left arm, He was take emergently to OR for recurrence of epidural hematoma on 10-25.  He was taken off of anticoagulation.   -10/28 Night: developed left arm weakness, numbness. Stat MRI was ordered, which confirmed recurrent hematoma. Patient was taken to OR for hematoma evacuation 10-28.  -For his thrombocytopenia, Heme Dr.Ennever following, Bone marrow  biopsy was done. For LE DVT, he has IVC filter. Patient has received multiple platelet transfusions and Nplate.  Patient underwent CT scan of his chest on 11/13 which showed new pulmonary embolism.  Patient was started on bivalirudin after discussions between hematology as well as neurosurgery.  Platelet counts have improved.  On 11/22 pt was found to have free intraabdominal air, it was followed with CT abd pelvis , shows possible stranding at the greater curvature of the stomach.  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  -Complicated postop course -Status post cervical decompressive surgery on 06/14/2018 -Status post evacuation of large hematoma and placement of Hemovac drain on 06/22/2018. -After this patient had multiple more clot evacuation procedures on 10/19, 10/25, 10/28  -Repeat MRI on 07/23/2018 shows improvement in the fluid collection.  Neurosurgery continues to follow -now stable per NSG from spine standpoint -Needs aggressive rehab, plan for SNF when stable  Acute pulmonary embolism/DVT in the setting of spine surgery Status post IVC filter placement on March 28, 2018 patient has been on multiple anticoagulants which has led to recurrent episodes of hematomas at the surgical sites requiring further evacuations.   -He was kept off anticoagulation for a brief period, due to recurrent bleeding/hematomas -However CT chest on 11/13 showed new pulmonary embolism with evidence of right heart.  He was subsequently started on bivalrudin, platelet counts are improving.   -11/28 with worsening left arm swelling, Dopplers noted acute DVT in left subclavian axillary,  brachial and  radial veins, and superficial thrombus in left cephalic and basilic veins -Bibvalrudin switched to Argatraban 11/29  -11/30 -right IJ central line placed, left arm PIC discontinued -Keep left arm elevated -Hopefully can transition from Argatroban to Eliquis tomorrow, platelet count is close to normal range  Fever 11/28-11/30 -Suspect this is related to extensive left upper extremity DVTs, anticoagulation switched over to her Argatrban, blood cultures are negative -Complete course of IV cefepime due to recent pneumoperitoneum with 10 days on Tuesday 12/3  Pneumoperitoneum -Incidentally noted to have pneumoperitoneum on 11/24 -General surgery consulted -CT abdomen pelvis 11/24 with soft tissue stranding adjacent to the greater curvature of the body of the stomach, upper GI series on 1125 was negative for leak/perforation -At this time remains afebrile on broad-spectrum antibiotics, tolerating diet and having bowel movements -Suspect he might have had a microperforation which has sealed off -Complete a 10-day course of antibiotics, continue PPI for stress ulcer prophylaxis  Thrombocytopenia/coagulopathy Patient had extensive work-up for acute thrombocytopenia.   cardiolipin antibody lupus anticoagulant, homocysteine levels factor VIII levels all within normal limits.  Protein S activity was low, protein C activity was greater than 200.  HIT panel was mildly elevated but serotonin release assay was negative.  Over the course of his hospitalization patient had multiple platelet transfusions and his platelet count has slowly increased with endplate infusions on weekly basis.   -Oncology following. -thrombocytopenia has resolved  Hyperglycemia secondary to steroids Currently on Lantus and sliding scale insulin. Hemoglobin A1c was 5.8 .  Anemia due to acute illness -Stable, improving  Sinus bradycardia - Is currently asymptomatic, TSH was within normal limits.  - Plan to avoid AV  nodal blocking agents.  Fluid overload with upper and lower extremity edema  -Third spacing -Restart IV Lasix x2 days with potassium -Monitor electrolytes  Adjustment disorder with depression and anxiety -Continue klonopin as needed  Essential hypertension  -well controlled  Acute renal failure Resolved  Stage II pressure ulcer on the right ischial tuberosity not present on admission  wound care consulted and recommendations given.  Oropharyngeal candidiasis -Completed course of fluconazole.  Acute diarrhea Resolved   DVT prophylaxis: Argatraban Code Status:  Full code Family Communication: no family at bedside.  Disposition Plan: Will need prolonged rehab   Consultants:   NS Dr Wynetta Emery  Hematology  Dr Myna Hidalgo.   Cardiology.   Surgery Dr Dwain Sarna.    Procedures:  On 10/2 1. posterior cervical decompressive laminectomy at C3, C4, C5 with removal of the spinous process and complete laminectomy and foraminotomies of the C3, C4, C5 nerve roots. 2. Posterior cervical fusion with lateral mass screws at C3-C4-C5 and pars screws at C2 lies in the globus ellipseLateral mass screw system 3. Posterior lateral arthrodesis C2-C5 utilizing locally harvested autograft mixed with vivigen  06/22/2018: Procedure reexploration posterior cervical wound for evacuation of epidural hematoma  06/26/2018 DVT ultrasound:Right: Findings consistent with acute deep vein thrombosis involving the right common femoral vein, right femoral vein, right popliteal vein, right posterior tibial vein, and right peroneal vein. Thrombosis extends proximally into external iliac vein.  Proximal iliac vein not visualized. Left: Findings consistent with acute deep vein thrombosis involving the left common femoral vein, left femoral vein, left popliteal vein, left posterior tibial vein, and left peroneal vein. Findings consistent with acute superficial vein thrombosis  involving the left great saphenous  vein. Thrombosis extends proximally into external iliac vein. Proximal iliac vein not visualized.  07/01/18 Clot evacuation   RUE doppler 07/02/18 Antimicrobials: vancomycin and  zosyn since 11/23 for sepsis and abdominal infection.   Subjective: -No complaints, had a low-grade fever last night, left arm swelling appears a little better,  Objective: Vitals:   08/13/18 0247 08/13/18 0400 08/13/18 0725 08/13/18 1200  BP: 126/85 111/65 131/82 (!) 149/92  Pulse: (!) 119 (!) 112 96 (!) 101  Resp: (!) 23 (!) 25 (!) 23 (!) 21  Temp: (!) 101.4 F (38.6 C)  98.5 F (36.9 C)   TempSrc: Oral     SpO2: 94% 94% 97% 99%  Weight:      Height:        Intake/Output Summary (Last 24 hours) at 08/13/2018 1252 Last data filed at 08/13/2018 0800 Gross per 24 hour  Intake 467.06 ml  Output 1300 ml  Net -832.94 ml   Filed Weights   07/07/18 1000 07/18/18 0600 07/27/18 1502  Weight: 123.8 kg 135 kg 127.6 kg    Examination: Gen: Awake, Alert, Oriented X 3, chronically ill obese male sitting in bed HEENT: PERRLA, Neck supple, no JVD Lungs: Decreased breath sounds at both bases CVS: RRR,No Gallops,Rubs or new Murmurs Abd: soft, Non tender, non distended, BS present Extremities: : 1+ bilateral lower extremity edema, left upper extremity with PICC line and swelling, mild swelling and tenderness of left ankle Neuro: awake alert, oriented x4, right upper extremity strength is 5 x 5, left upper extremity has diminished proximal muscle strength and 4+ at the wrist -Able to wiggle his toes on the right foot, no movements noted in the left leg  Data Reviewed: I have personally reviewed following labs and imaging studies  CBC: Recent Labs  Lab 08/09/18 0656 08/10/18 0408 08/11/18 0639 08/12/18 0320 08/13/18 0628  WBC 10.1 9.9 9.8 11.8* 10.7*  HGB 8.0* 9.1* 8.9* 7.6* 8.5*  HCT 26.6* 30.0* 29.1* 25.8* 27.4*  MCV 96.7 95.8 96.7 98.5 96.8  PLT 172 149* 149* 183 137*   Basic Metabolic  Panel: Recent Labs  Lab 08/07/18 0500 08/08/18 0545 08/11/18 0639 08/12/18 0320  NA 138 137 140 139  K 3.3* 2.6* 3.8 3.9  CL 102 103 111 106  CO2 30 27 24 24   GLUCOSE 80 75 109* 103*  BUN 15 13 11 14   CREATININE 0.76 0.62 0.58* 0.62  CALCIUM 8.5* 8.4* 8.7* 8.8*   GFR: Estimated Creatinine Clearance: 142.8 mL/min (by C-G formula based on SCr of 0.62 mg/dL). Liver Function Tests: Recent Labs  Lab 08/07/18 0500  AST 37  ALT 24  ALKPHOS 64  BILITOT 0.8  PROT 5.2*  ALBUMIN 2.3*   No results for input(s): LIPASE, AMYLASE in the last 168 hours. No results for input(s): AMMONIA in the last 168 hours. Coagulation Profile: No results for input(s): INR, PROTIME in the last 168 hours. Cardiac Enzymes: No results for input(s): CKTOTAL, CKMB, CKMBINDEX, TROPONINI in the last 168 hours. BNP (last 3 results) No results for input(s): PROBNP in the last 8760 hours. HbA1C: No results for input(s): HGBA1C in the last 72 hours. CBG: Recent Labs  Lab 08/12/18 1125 08/12/18 1649 08/12/18 2151 08/13/18 0726 08/13/18 1224  GLUCAP 124* 100* 161* 135* 145*   Lipid Profile: No results for input(s): CHOL, HDL, LDLCALC, TRIG, CHOLHDL, LDLDIRECT in the last 72 hours. Thyroid Function Tests: No results for input(s): TSH, T4TOTAL, FREET4, T3FREE, THYROIDAB in the last 72 hours. Anemia Panel: No results for input(s): VITAMINB12, FOLATE, FERRITIN, TIBC, IRON, RETICCTPCT in the last 72 hours. Sepsis Labs: Recent Labs  Lab 08/06/18 1802  LATICACIDVEN 2.3*  Recent Results (from the past 240 hour(s))  Culture, blood (Routine X 2) w Reflex to ID Panel     Status: None   Collection Time: 08/05/18  9:40 AM  Result Value Ref Range Status   Specimen Description BLOOD RIGHT ANTECUBITAL  Final   Special Requests   Final    BOTTLES DRAWN AEROBIC ONLY Blood Culture adequate volume   Culture   Final    NO GROWTH 5 DAYS Performed at [facility], [address], [place], [place]  [address]    Report Status 08/10/2018 FINAL  Final  Culture, blood (Routine X 2) w Reflex to ID Panel     Status: None   Collection Time: 08/05/18  9:47 AM  Result Value Ref Range Status   Specimen Description BLOOD RIGHT ANTECUBITAL  Final   Special Requests   Final    BOTTLES DRAWN AEROBIC ONLY Blood Culture adequate volume   Culture   Final    NO GROWTH 5 DAYS Performed at [facility], [address] [place], [place] [address]    Report Status 08/10/2018 FINAL  Final  Culture, blood (routine x 2)     Status: None (Preliminary result)   Collection Time: 08/11/18  8:44 AM  Result Value Ref Range Status   Specimen Description BLOOD RIGHT ANTECUBITAL  Final   Special Requests   Final    BOTTLES DRAWN AEROBIC AND ANAEROBIC Blood Culture adequate volume   Culture   Final    NO GROWTH 2 DAYS Performed at [facility], [address], [place], [place] [address]    Report Status PENDING  Incomplete  Culture, blood (routine x 2)     Status: None (Preliminary result)   Collection Time: 08/11/18  8:44 AM  Result Value Ref Range Status   Specimen Description BLOOD RIGHT HAND  Final   Special Requests   Final    BOTTLES DRAWN AEROBIC ONLY Blood Culture adequate volume   Culture   Final    NO GROWTH 2 DAYS Performed at [facility], [address], [place], [place] [address]    Report Status PENDING  Incomplete         Radiology Studies: Dg Chest Port 1 View  Result Date: 08/12/2018 CLINICAL DATA:  Right central line placement EXAM: PORTABLE CHEST 1 VIEW COMPARISON:  08/09/2018 FINDINGS: Right IJ central line tip mid SVC level. Left PICC line tip in a similar position. Exam is rotated to the right. Mild cardiomegaly. Low lung volumes persist with basilar atelectasis, worse on the right. No enlarging effusion or pneumothorax. Aorta atherosclerotic. Similar lucency of the upper abdomen compatible with known pneumoperitoneum. IMPRESSION: Right IJ central line tip mid  SVC level. Persistent low lung volumes with basilar atelectasis Upper abdominal pneumoperitoneum as before. Electronically Signed   By: Judie Petit.  Shick M.D.   On: 08/12/2018 14:59        Scheduled Meds: . sodium chloride   Intravenous Once  . Chlorhexidine Gluconate Cloth  6 each Topical Q0600  . clonazePAM  0.5 mg Oral BID  . famotidine  20 mg Oral Daily  . feeding supplement (ENSURE ENLIVE)  237 mL Oral BID BM  . gabapentin  300 mg Oral TID  . Gerhardt's butt cream   Topical BID  . Influenza vac split quadrivalent PF  0.5 mL Intramuscular Tomorrow-1000  . insulin aspart  0-15 Units Subcutaneous TID WC  . insulin aspart  0-5 Units Subcutaneous QHS  .  insulin glargine  20 Units Subcutaneous QHS  . lipase/protease/amylase  36,000 Units Oral TID AC  . magic mouthwash w/lidocaine  5 mL Oral TID  . methocarbamol  500 mg Oral QID  . midodrine  5 mg Oral BID WC  . multivitamin with minerals  1 tablet Oral Daily  . saccharomyces boulardii  250 mg Oral BID  . sodium chloride flush  10-40 mL Intracatheter Q12H   Continuous Infusions: . sodium chloride 10 mL (08/11/18 0507)  . argatroban 1 mcg/kg/min (08/13/18 0741)  . ceFEPime (MAXIPIME) IV 1 g (08/13/18 1056)     LOS: 67 days   Zannie Cove, MD [facility] Page via [email or web address]  If 7PM-7AM, please contact night-coverage [email or web address] Password [email or web address] 08/13/2018, 12:52 PM

## 2018-08-13 NOTE — Progress Notes (Signed)
Patient ID: [name] [name], male   DOB: 1965/11/02, 52 y.o.   MRN: [id number] Vital signs are stable Patient's neck incision looks well Sutures from October 19 surgery are due to be removed today after 8 AM

## 2018-08-14 DIAGNOSIS — I82409 Acute embolism and thrombosis of unspecified deep veins of unspecified lower extremity: Secondary | ICD-10-CM

## 2018-08-14 LAB — APTT: aPTT: 72 seconds — ABNORMAL HIGH (ref 24–36)

## 2018-08-14 LAB — CBC
HCT: 30.3 % — ABNORMAL LOW (ref 39.0–52.0)
Hemoglobin: 9.3 g/dL — ABNORMAL LOW (ref 13.0–17.0)
MCH: 29.4 pg (ref 26.0–34.0)
MCHC: 30.7 g/dL (ref 30.0–36.0)
MCV: 95.9 fL (ref 80.0–100.0)
Platelets: 160 10*3/uL (ref 150–400)
RBC: 3.16 MIL/uL — ABNORMAL LOW (ref 4.22–5.81)
RDW: 20.2 % — ABNORMAL HIGH (ref 11.5–15.5)
WBC: 11 10*3/uL — ABNORMAL HIGH (ref 4.0–10.5)
nRBC: 1.1 % — ABNORMAL HIGH (ref 0.0–0.2)

## 2018-08-14 LAB — BASIC METABOLIC PANEL
Anion gap: 7 (ref 5–15)
BUN: 12 mg/dL (ref 6–20)
CHLORIDE: 101 mmol/L (ref 98–111)
CO2: 30 mmol/L (ref 22–32)
CREATININE: 0.69 mg/dL (ref 0.61–1.24)
Calcium: 8.9 mg/dL (ref 8.9–10.3)
GFR calc Af Amer: >60 mL/min (ref >60)
GFR calc non Af Amer: >60 mL/min (ref >60)
Glucose, Bld: 94 mg/dL (ref 70–99)
Potassium: 3.8 mmol/L (ref 3.5–5.1)
SODIUM: 138 mmol/L (ref 135–145)

## 2018-08-14 LAB — GLUCOSE, CAPILLARY
Glucose-Capillary: 76 mg/dL (ref 70–99)
Glucose-Capillary: 90 mg/dL (ref 70–99)
Glucose-Capillary: 81 mg/dL (ref 70–99)
Glucose-Capillary: 68 mg/dL — ABNORMAL LOW (ref 70–99)
Glucose-Capillary: 80 mg/dL (ref 70–99)

## 2018-08-14 MED ORDER — LOPERAMIDE HCL 2 MG PO CAPS
2.0000 mg | ORAL_CAPSULE | ORAL | Status: AC | PRN
  Administered 2018-09-01: 2 mg via ORAL
  Filled 2018-08-14: qty 1

## 2018-08-14 MED ORDER — BISMUTH SUBSALICYLATE 262 MG/15ML PO SUSP
30.0000 mL | Freq: Three times a day (TID) | ORAL | Status: DC
  Administered 2018-08-14 – 2018-09-05 (×46): 30 mL via ORAL
  Filled 2018-08-14 (×5): qty 236

## 2018-08-14 MED ORDER — FUROSEMIDE 10 MG/ML IJ SOLN
40.0000 mg | Freq: Two times a day (BID) | INTRAMUSCULAR | Status: DC
  Administered 2018-08-14 – 2018-08-18 (×7): 40 mg via INTRAVENOUS
  Filled 2018-08-14 (×6): qty 4

## 2018-08-14 MED ORDER — COLLAGENASE 250 UNIT/GM EX OINT
TOPICAL_OINTMENT | Freq: Every day | CUTANEOUS | Status: AC
  Administered 2018-08-14 – 2018-08-27 (×15): via TOPICAL
  Filled 2018-08-14 (×2): qty 30

## 2018-08-14 MED ORDER — POTASSIUM CHLORIDE CRYS ER 20 MEQ PO TBCR
40.0000 meq | EXTENDED_RELEASE_TABLET | Freq: Two times a day (BID) | ORAL | Status: DC
  Administered 2018-08-14 – 2018-08-27 (×26): 40 meq via ORAL
  Filled 2018-08-14 (×26): qty 2

## 2018-08-14 NOTE — Progress Notes (Signed)
Physical Therapy Treatment Patient Details Name: [name] [name] MRN: [id number] DOB: October 15, 1965 Today's Date: 08/14/2018    History of Present Illness 52 y.o. male admitted on 06/06/18 for bil LE weakness and muscle spasm after multiple falls (with resultant R knee meniscus tear placed in bledsoe brace) following a recent ACDF at C3 in July 2019 (complicated by PE (s/p IVC filter), CIR, and then SNF placement before finally returning home with his parents).  In the ED MRI of c-spine which showed post operative changes and cord edema and possibility of ischemia.  His R knee was aspirated on 06/09/18.  S/p posterior cervical decompression and fusion on 06/14/18. Entire acute course complicated by hypotension in standing (thought to be autonomic) and by finding of multiple LE DVTs (already has an IVC filter in place, but per MDs notes at risk for blocking the filter with these clots).  Pt with post op issues with incisional bleeding s/p 2 evacuations of hematomas and placement of hemovac (06/22/18 and 07/01/18) as well as multiple units of plasma.  Cardiology consulted 07/05/18 due to bradycardia thought to be from his newly started blood thinner.  Pt with another re-accumulation of epidural hematoma with increased L sided weakness and cord compression with emergent evacuation surgery 10/25 & 07/10/18.  Pt also needing more platelets post op.  Bone marrow bx 07/12/18.  Pt found to have submassive PE on 07/27/18 and re-started on anticoagulation. PMHx of HTN, gout, anemia, DDD (lumbar).  08/10/18 found to have a L UE thrombus (same arm as his PICC line).      PT Comments    Goals re-assessed today.  Pt continues to be frustrated by his progress, lack of dx for all of his medical complications, and I honestly, do not feel he is processing his quadriplegia well, as he continues to believe the key to him progressing with therapy is getting the fluid off of his legs.  His affect is always better after he sees [place]  from therapy and PT/OT plan to team up once a week to go in and see him together for more skilled-hands on sessions.  We did not lift him to the chair today as we were told his ischial wound is worsening.  We repositioned him after being EOB to his other side.  As he continues to be here I would like to get a [facility] vendor involved to see if we can at least get him a loaner chair similar to what he will need (22x20 tilt in space, power tilt and power drive with roho cushion), as I think it would help his affect to get up out of the room and feel he has some control over something.    Follow Up Recommendations  SNF     Equipment Recommendations  Wheelchair (measurements PT);Wheelchair cushion (measurements PT);Hospital bed(22x20 tilt in space power chair, roho cushion, hoyer lift)    Recommendations for Other Services   NA     Precautions / Restrictions Precautions Precautions: Fall;Cervical;Other (comment) Precaution Comments: Several bed sores on bottom.   Required Braces or Orthoses: Cervical Brace Cervical Brace: Soft collar Restrictions Weight Bearing Restrictions: No    Mobility  Bed Mobility Overal bed mobility: Needs Assistance Bed Mobility: Rolling;Sidelying to Sit;Sit to Sidelying Rolling: +2 for physical assistance;Total assist Sidelying to sit: +2 for physical assistance;Total assist     Sit to sidelying: +2 for physical assistance;Total assist General bed mobility comments: Two person total assist to roll bil for pad placement and to get up  to EOB.  Assist needed at trunk and legs as pt is too weak to help much physically.  He does well directing his care, telling caregivers to slide him over to the left hand side first to give him enough room to roll to the right before sitting up.   Transfers                 General transfer comment: we did not get OOB to the chair today as his ichial wound is getting worse and he does not have a proper cushion for the chair or way  to relieve pressure while up.          Balance Overall balance assessment: Needs assistance Sitting-balance support: Feet supported;Bilateral upper extremity supported;No upper extremity supported;Single extremity supported Sitting balance-Leahy Scale: Zero Sitting balance - Comments: maxA to sit EOB; sat EOB >15 min during session.  Worked on lateral leans to each elbow, head upright and in midline, shoulder shrugs, cervical (gentle) ROM, adjusting soft collar to help reduce itching/rubbing on his posterior neck and opening up his chest with therapist behind hip stretching his pecs bil.  Postural control: Posterior lean;Left lateral lean                                  Cognition Arousal/Alertness: Awake/alert Behavior During Therapy: WFL for tasks assessed/performed Overall Cognitive Status: Within Functional Limits for tasks assessed                                 General Comments: pt with flat affect and frustrated with overall status at start of session, improved affect as session progressed      Exercises Other Exercises Other Exercises: worked on sitting balance EOB, scapular retraction, lateral leans and wt bearing through R/L elbow; strectching neck to promote more upright posture and working on turning head towards L/R         Pertinent Vitals/Pain Pain Assessment: Faces Faces Pain Scale: Hurts little more Pain Location: LUE, neck with certain movements Pain Descriptors / Indicators: Aching;Sore Pain Intervention(s): Limited activity within patient's tolerance;Monitored during session;Repositioned           PT Goals (current goals can now be found in the care plan section) Acute Rehab PT Goals Patient Stated Goal: to get his medical conditions figured out PT Goal Formulation: With patient Time For Goal Achievement: 08/28/18 Potential to Achieve Goals: Fair Progress towards PT goals: Progressing toward goals(goals checked)     Frequency    Min 2X/week      PT Plan Current plan remains appropriate    Co-evaluation PT/OT/SLP Co-Evaluation/Treatment: Yes Reason for Co-Treatment: Complexity of the patient's impairments (multi-system involvement);Necessary to address cognition/behavior during functional activity;For patient/therapist safety;To address functional/ADL transfers PT goals addressed during session: Mobility/safety with mobility;Balance;Strengthening/ROM OT goals addressed during session: Strengthening/ROM      AM-PAC PT "6 Clicks" Mobility   Outcome Measure  Help needed turning from your back to your side while in a flat bed without using bedrails?: Total Help needed moving from lying on your back to sitting on the side of a flat bed without using bedrails?: Total Help needed moving to and from a bed to a chair (including a wheelchair)?: Total Help needed standing up from a chair using your arms (e.g., wheelchair or bedside chair)?: Total Help needed to walk in hospital room?: Total  Help needed climbing 3-5 steps with a railing? : Total 6 Click Score: 6    End of Session Equipment Utilized During Treatment: Cervical collar;Other (comment)(soft collar) Activity Tolerance: Patient tolerated treatment well Patient left: in bed;with call bell/phone within reach   PT Visit Diagnosis: Other abnormalities of gait and mobility (R26.89);Other symptoms and signs involving the nervous system (R29.898);Muscle weakness (generalized) (M62.81)     Time: 1610-9604 PT Time Calculation (min) (ACUTE ONLY): 73 min  Charges:  $Therapeutic Activity: 8-22 mins, 1 re-eval        Jhayden Demuro B. Lexany Belknap, PT, DPT  Acute Rehabilitation [phone] pager #[phone] office            08/14/2018, 6:20 PM

## 2018-08-14 NOTE — Progress Notes (Signed)
ANTICOAGULATION CONSULT NOTE   Pharmacy Consult for Argatroban  Indication: pulmonary embolus and bilateral DVT   Patient Measurements: Height: 5\' 9"  (175.3 cm) Weight: 281 lb 4.9 oz (127.6 kg) IBW/kg (Calculated) : 70.7 Bivalirudin Dosing Weight: 127.6 kg  Vital Signs: Temp: 99.4 F (37.4 C) (12/02 0800) Temp Source: Oral (12/02 0300) BP: 122/79 (12/02 0800) Pulse Rate: 111 (12/02 0800)  Labs: Recent Labs    08/12/18 0320 08/13/18 0628 08/14/18 0426  HGB 7.6* 8.5* 9.3*  HCT 25.8* 27.4* 30.3*  PLT 183 137* 160  APTT 65* 76* 72*  CREATININE 0.62  --  0.69    Assessment: 52 yr old male on Eliquis PTA for hx of PE and DVTs. Has IVC filter. Has had multiple events of bleeding over the last couple months. HIT is negative but started on Bivalirudin on 11/25. Found to have new thrombus on 11/29 and heme/onc recommended to transition to argatroban for anticoagulation  aPTT remains therapeutic 72 sec.   Hgb 9.3. Platelet count 160 today.  Goal of Therapy:  aPTT 50-90 seconds Monitor platelets by anticoagulation protocol: Yes   Plan:  1. Continue argatroban IV 141mcg/kg/min (7.6 ml/hr) 2. Monitor daily aPTT, CBC, s/s of bleed 3. Await long term plan on anticoagulation    Jeanella Caraathy Lorn Butcher, PharmD, [facility] Clinical Pharmacist Please see AMION for all Pharmacists' Contact Phone Numbers 08/14/2018, 10:52 AM

## 2018-08-14 NOTE — Progress Notes (Signed)
Marland Kitchen   HEMATOLOGY/ONCOLOGY INPATIENT PROGRESS NOTE  Date of Service: 08/13/2018 Inpatient Attending: .Zannie Cove, MD   SUBJECTIVE  Patient was seen in hematologic f/u today. Significant LUE swelling. PICC line out. Transitioned from Bivalirudin to Argatroban per Dr Myna Hidalgo. No acute new symptoms. Patient distressed by LUE swelling and b/l lower extremity swelling and understandably frustrated with slow progress with therapies.    OBJECTIVE:  NAD  PHYSICAL EXAMINATION: . Vitals:   08/13/18 1200 08/13/18 1545 08/13/18 1900 08/13/18 2300  BP: (!) 149/92 (!) 147/92 136/79 122/74  Pulse: (!) 101 (!) 118 (!) 116 (!) 105  Resp: (!) 21 (!) 31 17 (!) 28  Temp: 98.7 F (37.1 C) 98.9 F (37.2 C) (!) 101.3 F (38.5 C) 98.8 F (37.1 C)  TempSrc: Oral Oral Oral Oral  SpO2: 99% 100% 95% 97%  Weight:      Height:       Filed Weights   07/07/18 1000 07/18/18 0600 07/27/18 1502  Weight: 272 lb 14.9 oz (123.8 kg) 297 lb 9.9 oz (135 kg) 281 lb 4.9 oz (127.6 kg)   .Body mass index is 41.54 kg/m.  GENERAL:alert, and oriented SKIN: skin color, texture, turgor are normal, no rashes or significant lesions EYES: normal, conjunctiva are pink and non-injected, sclera clear OROPHARYNX:MMM NECK: supple, mild JVD LYMPH:  no palpable lymphadenopathy in the cervical, axillary or inguinal LUNGS: clear to auscultation with normal respiratory effort HEART: regular rate & rhythm,  LUE and B/L LE swelling ABDOMEN: abdomen soft, non-tender, normoactive bowel sounds  PSYCH: alert & oriented x 3 with fluent speech NEURO: alxox3   MEDICAL HISTORY:  Past Medical History:  Diagnosis Date  . Degenerative disc disease, lumbar   . G6PD deficiency   . Gout   . History of pernicious anemia 1980   Patient says he takes OTC iron supplements QD  . Hypertension     SURGICAL HISTORY: Past Surgical History:  Procedure Laterality Date  . ANTERIOR CERVICAL CORPECTOMY N/A 03/24/2018   Procedure: CERVICAL  FOUR ANTERIOR CERVICAL CORPECTOMY;  Surgeon: Donalee Citrin, MD;  Location: [facility] OR;  Service: Neurosurgery;  Laterality: N/A;  . ANTERIOR CERVICAL DECOMP/DISCECTOMY FUSION N/A 03/28/2018   Procedure: Reexploration of anterior cervical wound;  Surgeon: Donalee Citrin, MD;  Location: [facility] OR;  Service: Neurosurgery;  Laterality: N/A;  . HEMATOMA EVACUATION N/A 07/10/2018   Procedure: POSTERIOR CERVICAL HEMATOMA EVACUATION;  Surgeon: Jadene Pierini, MD;  Location: [facility] OR;  Service: Neurosurgery;  Laterality: N/A;  . IR IVC FILTER PLMT / S&I Lenise Arena GUID/MOD SED  03/28/2018  . IR RADIOLOGIST EVAL & MGMT  05/31/2018  . MENISCUS REPAIR Left   . POSTERIOR CERVICAL FUSION/FORAMINOTOMY N/A 06/14/2018   Procedure: POSTERIOR CERVICAL DECOMPRESSION/FUSION CERVICAL TWO- CERVICAL THREE CERVICAL THREE- CERVICAL FOUR, CERVICAL FOUR- CERVICAL FIVE;  Surgeon: Donalee Citrin, MD;  Location: [facility] OR;  Service: Neurosurgery;  Laterality: N/A;  POSTERIOR CERVICAL DECOMPRESSION/FUSION CERVICAL TWO- CERVICAL THREE CERVICAL THREE- CERVICAL FOUR, CERVICAL FOUR- CERVICAL FIVE  . POSTERIOR CERVICAL LAMINECTOMY N/A 06/22/2018   Procedure: POSTERIOR CERVICAL LAMINECTOMY FOR EVACUATION OF HEMATOMA;  Surgeon: Donalee Citrin, MD;  Location: [facility] OR;  Service: Neurosurgery;  Laterality: N/A;  . POSTERIOR CERVICAL LAMINECTOMY N/A 07/07/2018   Procedure: Evacuation of Hematoma POSTERIOR CERVICAL;  Surgeon: Donalee Citrin, MD;  Location: [facility] OR;  Service: Neurosurgery;  Laterality: N/A;  Evacuation of Hematoma POSTERIOR CERVICAL  . POSTERIOR CERVICAL LAMINECTOMY FOR EPIDURAL ABSCESS N/A 07/01/2018   Procedure: EVACUATION OF POSTERIOR CERVICAL HEMATOMA;  Surgeon: Jadene Pierini,  MD;  Location: [facility] OR;  Service: Neurosurgery;  Laterality: N/A;  . TENDON REPAIR     tricept tendon    SOCIAL HISTORY: Social History   Socioeconomic History  . Marital status: Legally Separated    Spouse name: Not on file  . Number of children: Not on file  . Years of  education: Not on file  . Highest education level: Not on file  Occupational History  . Not on file  Social Needs  . Financial resource strain: Not on file  . Food insecurity:    Worry: Not on file    Inability: Not on file  . Transportation needs:    Medical: Not on file    Non-medical: Not on file  Tobacco Use  . Smoking status: Never Smoker  . Smokeless tobacco: Never Used  Substance and Sexual Activity  . Alcohol use: Never    Frequency: Never  . Drug use: Never  . Sexual activity: Not Currently    Birth control/protection: Abstinence  Lifestyle  . Physical activity:    Days per week: Not on file    Minutes per session: Not on file  . Stress: Not on file  Relationships  . Social connections:    Talks on phone: Not on file    Gets together: Not on file    Attends religious service: Not on file    Active member of club or organization: Not on file    Attends meetings of clubs or organizations: Not on file    Relationship status: Not on file  . Intimate partner violence:    Fear of current or ex partner: Not on file    Emotionally abused: Not on file    Physically abused: Not on file    Forced sexual activity: Not on file  Other Topics Concern  . Not on file  Social History Narrative  . Not on file    FAMILY HISTORY: Family History  Family history unknown: Yes    ALLERGIES:  is allergic to nsaids; sulfa antibiotics; and protonix [pantoprazole sodium].  MEDICATIONS:  Scheduled Meds: . sodium chloride   Intravenous Once  . Chlorhexidine Gluconate Cloth  6 each Topical Q0600  . clonazePAM  0.5 mg Oral BID  . famotidine  20 mg Oral Daily  . feeding supplement (ENSURE ENLIVE)  237 mL Oral BID BM  . furosemide  40 mg Intravenous Q12H  . gabapentin  300 mg Oral TID  . Gerhardt's butt cream   Topical BID  . Influenza vac split quadrivalent PF  0.5 mL Intramuscular Tomorrow-1000  . insulin aspart  0-15 Units Subcutaneous TID WC  . insulin aspart  0-5 Units  Subcutaneous QHS  . insulin glargine  20 Units Subcutaneous QHS  . lipase/protease/amylase  36,000 Units Oral TID AC  . magic mouthwash w/lidocaine  5 mL Oral TID  . methocarbamol  500 mg Oral QID  . midodrine  5 mg Oral BID WC  . multivitamin with minerals  1 tablet Oral Daily  . potassium chloride  40 mEq Oral BID  . saccharomyces boulardii  250 mg Oral BID  . sodium chloride flush  10-40 mL Intracatheter Q12H   Continuous Infusions: . sodium chloride 10 mL (08/11/18 0507)  . argatroban 1 mcg/kg/min (08/13/18 2221)  . ceFEPime (MAXIPIME) IV 1 g (08/13/18 2223)   PRN Meds:.sodium chloride, acetaminophen **OR** acetaminophen, alum & mag hydroxide-simeth, HYDROcodone-homatropine, loperamide, morphine injection, ondansetron **OR** ondansetron (ZOFRAN) IV, oxyCODONE, sodium chloride flush, sodium chloride flush  REVIEW OF SYSTEMS:    10 Point review of Systems was done is negative except as noted above.   LABORATORY DATA:  I have reviewed the data as listed  . CBC Latest Ref Rng & Units 08/13/2018 08/12/2018 08/11/2018  WBC 4.0 - 10.5 K/uL 10.7(H) 11.8(H) 9.8  Hemoglobin 13.0 - 17.0 g/dL 4.0(J) 7.6(L) 8.9(L)  Hematocrit 39.0 - 52.0 % 27.4(L) 25.8(L) 29.1(L)  Platelets 150 - 400 K/uL 137(L) 183 149(L)    . CMP Latest Ref Rng & Units 08/12/2018 08/11/2018 08/08/2018  Glucose 70 - 99 mg/dL 811(B) 147(W) 75  BUN 6 - 20 mg/dL 14 11 13   Creatinine 0.61 - 1.24 mg/dL 2.95 6.21(H) 0.86  Sodium 135 - 145 mmol/L 139 140 137  Potassium 3.5 - 5.1 mmol/L 3.9 3.8 2.6(LL)  Chloride 98 - 111 mmol/L 106 111 103  CO2 22 - 32 mmol/L 24 24 27   Calcium 8.9 - 10.3 mg/dL 5.7(Q) 4.6(N) 6.2(X)  Total Protein 6.5 - 8.1 g/dL - - -  Total Bilirubin 0.3 - 1.2 mg/dL - - -  Alkaline Phos 38 - 126 U/L - - -  AST 15 - 41 U/L - - -  ALT 0 - 44 U/L - - -     RADIOGRAPHIC STUDIES: I have personally reviewed the radiological images as listed and agreed with the findings in the report. Dg Chest 2  View  Result Date: 07/20/2018 CLINICAL DATA:  Cough. very weak ,,had cervical spine surg 3 weeks ago and developed weakness bilat arms where he can't move them and unable to control them, attempted lat view but unable to get better film EXAM: CHEST - 2 VIEW COMPARISON:  Chest CT 07/15/2018, chest x-ray on 06/19/2018 FINDINGS: Shallow lung inflation. Heart size is normal. The MEDIAL lung bases, there is subsegmental atelectasis or early infiltrate. No pulmonary edema. Patient has a LEFT-sided PICC line, tip to the superior vena cava. Oxygen mask partially obscures the lung apices. IMPRESSION: 1. Shallow inflation. 2. Subsegmental atelectasis or early infiltrates in the lung bases. Electronically Signed   By: Norva Pavlov M.D.   On: 07/20/2018 10:27   Ct Chest W Contrast  Result Date: 07/15/2018 CLINICAL DATA:  GI bleed.  Decreased hematocrit EXAM: CT CHEST, ABDOMEN, AND PELVIS WITH CONTRAST TECHNIQUE: Multidetector CT imaging of the chest, abdomen and pelvis was performed following the standard protocol during bolus administration of intravenous contrast. CONTRAST:  ISOVUE-300 IOPAMIDOL (ISOVUE-300) INJECTION 61% COMPARISON:  CT AP 07/12/2018 and CT chest 06/23/2018 FINDINGS: CT CHEST FINDINGS Cardiovascular: The heart size appears within normal limits. No pericardial effusion identified. Mediastinum/Nodes: No enlarged mediastinal, hilar, or axillary lymph nodes. Thyroid gland, trachea, and esophagus demonstrate no significant findings. Lungs/Pleura: There is asymmetric elevation of the left hemidiaphragm. Subsegmental atelectasis within the left lower lobe and right lower lobe identified. Mild patchy airspace densities within the posterior right upper lobe identified. This appears similar to previous exam. Small dense nodule within the lateral left upper lobe measures 3 mm, image 79/4. Musculoskeletal: No chest wall mass or suspicious bone lesions identified. CT ABDOMEN PELVIS FINDINGS Hepatobiliary:  Multiple stones identified within the gallbladder measuring up to 2 cm. No focal liver abnormality. No biliary ductal dilatation. Pancreas: Unremarkable. No pancreatic ductal dilatation or surrounding inflammatory changes. Spleen: Normal in size without focal abnormality. Adrenals/Urinary Tract: The adrenal glands appear normal. No kidney mass or hydronephrosis. Urinary bladder appears normal. Stomach/Bowel: Increased density within the gastric lumen is identified. This is nonspecific particularly since the patient ingested positive  oral contrast material. No pathologic dilatation of the colon. No colonic mass identified. Vascular/Lymphatic: Normal appearance of the abdominal aorta. The portal vein and hepatic veins appear patent. There is a filter within the IVC. No adenopathy within the abdomen or pelvis. Reproductive: Prostate is unremarkable. Other: Trace abdominopelvic ascites. Fat containing umbilical hernia. Musculoskeletal: Body wall edema is identified compatible with anasarca. No acute or significant osseous abnormality. IMPRESSION: 1. No hematoma identified. Increased density within the gastric lumen noted, which is nonspecific in the setting of positive oral contrast material. Correlate for any clinical signs or symptoms of upper GI tract bleed. 2. Persistent patchy airspace densities within the posterior right upper lobe compatible with pneumonia. 3. Bilateral lower lobe subsegmental atelectasis with asymmetric elevation of the left hemidiaphragm. 4. Gallstones. 5. Trace ascites within the abdomen and pelvis. 6. Body wall edema noted. Electronically Signed   By: Signa Kell M.D.   On: 07/15/2018 19:10   Ct Angio Chest Pe W Or Wo Contrast  Addendum Date: 07/27/2018   ADDENDUM REPORT: 07/27/2018 10:43 ADDENDUM: The referring provider has asked that this exam be compared to the study from 03/27/2018. I have reviewed only the pulmonary arteries on the current study. The exam from 03/27/2018  demonstrated acute pulmonary embolus that had resolved by the study of 07/15/2018. The pulmonary embolic disease on the current study (07/26/2018) is therefore new since 07/15/2018 and is present in pulmonary arteries that were free of embolus on the 03/27/2018 exam (compare right main and interlobar pulmonary arteries on the 07/26/2018 and 03/27/2018 exams), again confirming this is new embolic disease since 07/15/2018. Electronically Signed   By: Kennith Center M.D.   On: 07/27/2018 10:43   Addendum Date: 07/27/2018   ADDENDUM REPORT: 07/27/2018 00:59 ADDENDUM: Critical Value/emergent results were called by telephone at the time of interpretation on 07/27/2018 at 12:58 am to Dr. Clearence Ped, hospitalist , who verbally acknowledged these results. Electronically Signed   By: Awilda Metro M.D.   On: 07/27/2018 00:59   Addendum Date: 07/27/2018   ADDENDUM REPORT: 07/27/2018 00:13 ADDENDUM: Acute findings discussed with and reconfirmed by Dr.Buccini, GI on 07/26/2018 at 12:13 am. Electronically Signed   By: Awilda Metro M.D.   On: 07/27/2018 00:13   Result Date: 07/27/2018 CLINICAL DATA:  Follow up pulmonary embolism. EXAM: CT ANGIOGRAPHY CHEST WITH CONTRAST TECHNIQUE: Multidetector CT imaging of the chest was performed using the standard protocol during bolus administration of intravenous contrast. Multiplanar CT image reconstructions and MIPs were obtained to evaluate the vascular anatomy. CONTRAST:  ISOVUE-370 IOPAMIDOL (ISOVUE-370) INJECTION 76% COMPARISON:  Chest radiograph July 25, 2018 and CT chest July 15, 2018 FINDINGS: CARDIOVASCULAR: Adequate contrast opacification of the pulmonary artery's. Main pulmonary artery is not enlarged. Central bilateral main pulmonary artery nonocclusive pulmonary emboli casting into lobar, segmental and subsegmental branches, some of which are occlusive. RIGHT heart stain (RV/LV 1.4). Heart size is upper limits of normal. No pericardial effusion.  MEDIASTINUM/NODES: No lymphadenopathy by CT size criteria. LEFT PICC distal tip and proximal superior vena cava. LUNGS/PLEURA: Tracheobronchial tree is patent, no pneumothorax. Low inspiratory examination with elevated LEFT hemidiaphragm. Patchy LEFT lower lobe consolidation. Bibasilar bandlike atelectasis. No pleural effusion. 2 mm lingular subpleural pulmonary nodules unchanged. UPPER ABDOMEN: Non-acute. Moderate amount of retained large bowel stool. MUSCULOSKELETAL: Non-acute. 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 Review of the MIP images confirms the above findings. IMPRESSION: 1. Positive for acute PE with CT evidence of right heart strain (RV/LV Ratio =  1.4) consistent with at least submassive (intermediate risk) PE. The presence of right heart strain has been associated with an increased risk of morbidity and mortality. 2. LEFT lower lobe infarct versus pneumonia.  Bibasilar atelectasis. 3. Mild cardiomegaly. Electronically Signed: By: Awilda Metro M.D. On: 07/26/2018 23:34   Ct Abdomen W Contrast  Result Date: 08/06/2018 CLINICAL DATA:  52 year old male with history of free air in the abdomen. Follow-up study. EXAM: CT ABDOMEN WITH CONTRAST TECHNIQUE: Multidetector CT imaging of the abdomen was performed using the standard protocol following bolus administration of intravenous contrast. CONTRAST:  OMNIPAQUE IOHEXOL 300 MG/ML  SOLN COMPARISON:  CT the abdomen and pelvis 08/03/2018. FINDINGS: Lower chest: Extensive linear scarring and/or atelectasis in the lower lobes of the lungs bilaterally. Hepatobiliary: No suspicious cystic or solid hepatic lesions. No intra or extrahepatic biliary ductal dilatation. Gallbladder is normal in appearance. Pancreas: No pancreatic mass. No pancreatic ductal dilatation. No pancreatic or peripancreatic fluid or inflammatory changes. Spleen: Unremarkable. Adrenals/Urinary Tract: Bilateral kidneys and adrenal  glands are normal in appearance. No hydroureteronephrosis in the visualized portions of the abdomen. Stomach/Bowel: There is some soft tissue stranding adjacent to the lateral aspect of the greater curvature of the stomach in the region of the body of the stomach best appreciated on axial image 21 of series 3 and coronal image 56 of series 6), concerning for a perforated gastric ulcer. The remainder of the stomach is otherwise unremarkable in appearance. No pathologic dilatation of visualized small bowel or colon. Vascular/Lymphatic: Aortic atherosclerosis. No lymphadenopathy noted in the abdomen. Other: IVC filter in position with tip terminating below the level of the renal veins. Large volume of pneumatosis again noted. No significant volume of ascites. Musculoskeletal: No aggressive appearing osseous lesions are noted in the visualized portions of the skeleton. IMPRESSION: 1. Persistent pneumoperitoneum. Soft tissue stranding adjacent to the greater curvature of the body of the stomach concerning for potential perforated gastric ulcer. No other definite source of viscus perforation identified on today's examination. 2. Aortic atherosclerosis. 3. Persistent atelectasis and/or scarring in the lower lobes of the lungs bilaterally. Electronically Signed   By: Trudie Reed M.D.   On: 08/06/2018 14:26   Mr Cervical Spine Wo Contrast  Result Date: 07/23/2018 CLINICAL DATA:  Hematoma following recent cervical fusion procedure. EXAM: MRI CERVICAL SPINE WITHOUT CONTRAST TECHNIQUE: Multiplanar, multisequence MR imaging of the cervical spine was performed. No intravenous contrast was administered. COMPARISON:  07/10/2018 FINDINGS: Alignment: Normal Vertebrae: Distant C4 corpectomy with anterior fusion C3 through C5. Subsequent posterior decompression and fusion with lateral mass screws and posterior rods C2 through C5. No new bone finding. Cord: As described below, there is no longer any compressive effect upon the  cord and spinal canal by the evacuated posterior fluid collection that was demonstrated on the previous studies. Subarachnoid spaces present dorsal to the cord. No evidence of active cord edema presently. Posterior Fossa, vertebral arteries, paraspinal tissues: Negative Disc levels: Foramen magnum is widely patent. There is ordinary osteoarthritis of the C1-2 articulation but no encroachment upon the neural spaces. Posterior fusion begins at the C2 level. The C2-3 disc bulges mildly. There is mild narrowing of the canal at this level but no cord compression. Foramina appear sufficiently patent. From C3 through C5, there is been wide laminectomy with posterior fusion. C3 through C5 anterior fusion with C4 corpectomy. Subtotal vacuo a shin of the posterior fluid material is associated with resolution of the posterior compressive effect upon the spinal canal, thecal sac and  spinal cord. 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 This continues to have some flattening affect on the left side of the cord, though ample subarachnoid spaces present dorsal to the cord. Foramina are not accurately evaluated due to the extensive surgical hardware artifact. C5-6: Mild bulging of the disc.  No canal or foraminal stenosis. C6-7: Normal interspace. C7-T1: Normal interspace. IMPRESSION: Marked improvement of the posterior fluid collection. Subtotal evacuation. A small amount of fluid remains in the midline but this does not have any compressive effect upon the thecal sac or spinal cord. Subarachnoid space is present dorsal to the spinal cord indicating absence of compression from this fluid. 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 There is subarachnoid space present dorsal to the cord in this region, however, because of the posterior decompression. Electronically Signed   By: Paulina Fusi M.D.   On: 07/23/2018 06:56   Ct Abdomen Pelvis W Contrast  Result Date: 08/04/2018 CLINICAL DATA:  Free air seen on x-ray. EXAM: CT ABDOMEN AND PELVIS WITH CONTRAST TECHNIQUE: Multidetector CT imaging of the abdomen and pelvis was performed using the standard protocol following bolus administration of intravenous contrast. CONTRAST:  OMNIPAQUE IOHEXOL 300 MG/ML  SOLN COMPARISON:  KUB August 03, 2018.  CT scan July 28, 2018. FINDINGS: Lower chest: Left greater than right pulmonary opacities, unchanged. No other acute abnormalities in the lower chest. Hepatobiliary: The liver and portal vein are normal. The gallbladder is unremarkable. Pancreas: Unremarkable. No pancreatic ductal dilatation or surrounding inflammatory changes. Spleen: Normal in size without focal abnormality. Adrenals/Urinary Tract: The adrenal glands, kidneys, ureters, and bladder are unremarkable. Stomach/Bowel: The stomach and small bowel are normal. Moderate fecal loading in the colon. The colon and appendix are otherwise normal. Vascular/Lymphatic: IVC filter. The abdominal aorta is nonaneurysmal. No dissection or adenopathy. Reproductive: Prostate is unremarkable. Other: There is a large amount of free air in the abdomen. This was also seen on the August 03, 2018 KUB. The finding of free air was called to the referring clinical team after the KUB. Musculoskeletal: No acute or significant osseous findings. IMPRESSION: 1. No cause for the patient's known free intraperitoneal air is identified. The quantity of free air is large. The clinical team is already aware of the free air as discussed on the KUB from earlier today. 2. Bibasilar opacities, left greater than right. The right basilar  opacity is almost certainly atelectasis. The left basilar opacity could represent atelectasis or infiltrate such as pneumonia. 3. Moderate fecal loading in the colon. 4. IVC filter. 5. No other abnormalities. Electronically Signed   By: Gerome Sam III M.D   On: 08/04/2018 00:41   Ct Abdomen Pelvis W Contrast  Result Date: 07/15/2018 CLINICAL DATA:  GI bleed.  Decreased hematocrit EXAM: CT CHEST, ABDOMEN, AND PELVIS WITH CONTRAST TECHNIQUE: Multidetector CT imaging of the chest, abdomen and pelvis was performed following the standard protocol during bolus administration of intravenous contrast. CONTRAST:  ISOVUE-300 IOPAMIDOL (ISOVUE-300) INJECTION 61% COMPARISON:  CT AP 07/12/2018 and CT chest 06/23/2018 FINDINGS: CT CHEST FINDINGS Cardiovascular: The heart size appears within normal limits. No pericardial effusion identified. Mediastinum/Nodes: No enlarged mediastinal,  hilar, or axillary lymph nodes. Thyroid gland, trachea, and esophagus demonstrate no significant findings. Lungs/Pleura: There is asymmetric elevation of the left hemidiaphragm. Subsegmental atelectasis within the left lower lobe and right lower lobe identified. Mild patchy airspace densities within the posterior right upper lobe identified. This appears similar to previous exam. Small dense nodule within the lateral left upper lobe measures 3 mm, image 79/4. Musculoskeletal: No chest wall mass or suspicious bone lesions identified. CT ABDOMEN PELVIS FINDINGS Hepatobiliary: Multiple stones identified within the gallbladder measuring up to 2 cm. No focal liver abnormality. No biliary ductal dilatation. Pancreas: Unremarkable. No pancreatic ductal dilatation or surrounding inflammatory changes. Spleen: Normal in size without focal abnormality. Adrenals/Urinary Tract: The adrenal glands appear normal. No kidney mass or hydronephrosis. Urinary bladder appears normal. Stomach/Bowel: Increased density within the gastric lumen is identified.  This is nonspecific particularly since the patient ingested positive oral contrast material. No pathologic dilatation of the colon. No colonic mass identified. Vascular/Lymphatic: Normal appearance of the abdominal aorta. The portal vein and hepatic veins appear patent. There is a filter within the IVC. No adenopathy within the abdomen or pelvis. Reproductive: Prostate is unremarkable. Other: Trace abdominopelvic ascites. Fat containing umbilical hernia. Musculoskeletal: Body wall edema is identified compatible with anasarca. No acute or significant osseous abnormality. IMPRESSION: 1. No hematoma identified. Increased density within the gastric lumen noted, which is nonspecific in the setting of positive oral contrast material. Correlate for any clinical signs or symptoms of upper GI tract bleed. 2. Persistent patchy airspace densities within the posterior right upper lobe compatible with pneumonia. 3. Bilateral lower lobe subsegmental atelectasis with asymmetric elevation of the left hemidiaphragm. 4. Gallstones. 5. Trace ascites within the abdomen and pelvis. 6. Body wall edema noted. Electronically Signed   By: Signa Kellaylor  Stroud M.D.   On: 07/15/2018 19:10   Dg Chest Port 1 View  Result Date: 08/12/2018 CLINICAL DATA:  Right central line placement EXAM: PORTABLE CHEST 1 VIEW COMPARISON:  08/09/2018 FINDINGS: Right IJ central line tip mid SVC level. Left PICC line tip in a similar position. Exam is rotated to the right. Mild cardiomegaly. Low lung volumes persist with basilar atelectasis, worse on the right. No enlarging effusion or pneumothorax. Aorta atherosclerotic. Similar lucency of the upper abdomen compatible with known pneumoperitoneum. IMPRESSION: Right IJ central line tip mid SVC level. Persistent low lung volumes with basilar atelectasis Upper abdominal pneumoperitoneum as before. Electronically Signed   By: Judie PetitM.  Shick M.D.   On: 08/12/2018 14:59   Dg Chest Port 1 View  Result Date:  08/09/2018 CLINICAL DATA:  Follow-up atelectasis EXAM: PORTABLE CHEST 1 VIEW COMPARISON:  08/03/2018 FINDINGS: Cardiac shadow is mildly enlarged but stable. Left-sided PICC line is again noted in the proximal superior vena cava. The overall inspiratory effort is poor. Left basilar atelectatic changes are again noted. Previously seen pneumoperitoneum is again visualized beneath the left hemidiaphragm. No bony abnormality is seen. IMPRESSION: Stable left basilar atelectasis. Stable pneumoperitoneum Electronically Signed   By: Alcide CleverMark  Lukens M.D.   On: 08/09/2018 08:57   Dg Chest Port 1 View  Result Date: 08/03/2018 CLINICAL DATA:  Fever. EXAM: PORTABLE CHEST 1 VIEW COMPARISON:  04/26/2018 in 07/2011 19 FINDINGS: Heart size is normal. There is subsegmental atelectasis or early infiltrate in the LEFT LOWER lobe. Mild focal atelectasis in the MEDIAL RIGHT lung base. Patient has a LEFT-sided PICC line, tip overlying the level of superior vena cava. There is elevation of LEFT hemidiaphragm. Subdiaphragmatic lucency may indicate distended stomach. Given the  appearance, is difficult to entirely exclude free intraperitoneal air, and correlation is recommended with any history of abdominal symptoms. The IMPRESSION: 1. LEFT LOWER lobe atelectasis or early infiltrate. 2. RIGHT LOWER lobe atelectasis. 3. Elevated LEFT hemidiaphragm with question of free intraperitoneal air. Alternatively this may represent dilated, gas-filled stomach or loop of colon. Consider further evaluation with a decubitus view of the abdomen if there are abdominal symptoms. These results will be called to the ordering clinician or representative by the Radiologist Assistant, and communication documented in the PACS or zVision Dashboard. Electronically Signed   By: Norva Pavlov M.D.   On: 08/03/2018 08:52   Dg Chest Port 1 View  Result Date: 07/25/2018 CLINICAL DATA:  Cough EXAM: PORTABLE CHEST 1 VIEW COMPARISON:  July 20, 2018 FINDINGS:  There is no edema or consolidation. Heart is upper normal in size with pulmonary vascularity normal. No adenopathy. Central catheter tip is in the superior vena cava. No pneumothorax. No bone lesions evident. IMPRESSION: No edema or consolidation. Stable cardiac silhouette. Central catheter tip in superior vena cava. Electronically Signed   By: Bretta Bang III M.D.   On: 07/25/2018 14:41   Dg Abd 2 Views  Result Date: 08/05/2018 CLINICAL DATA:  Free air in abdomen. EXAM: ABDOMEN - 2 VIEW COMPARISON:  Plain film of the abdomen dated 08/03/2018. FINDINGS: Persistent free intraperitoneal air, best seen on the decubitus image. Visualized bowel gas pattern is nonobstructive. IMPRESSION: Persistent free intraperitoneal air. This finding indicates viscus perforation until proven otherwise. Based on the location of the free intraperitoneal air, I suspect perforation at the level of the gastric fundus or splenic flexure. Recommend repeat abdomen CT with IV and oral contrast for further characterization. These results were called by telephone at the time of interpretation on 08/05/2018 at 8:02 pm to nurse Peach who verbally acknowledged these results. Electronically Signed   By: Bary Richard M.D.   On: 08/05/2018 20:03   Dg Abd 2 Views  Result Date: 08/03/2018 CLINICAL DATA:  Ileus EXAM: ABDOMEN - 2 VIEW COMPARISON:  07/28/2018 CT abdomen FINDINGS: There is a large amount of free intraperitoneal air throughout the upper abdomen bilaterally. No dilated small bowel loops. Large amount of stool throughout the colon. No radiopaque nephrolithiasis. Stable elevation of the left hemidiaphragm with left basilar atelectasis. IVC filter overlies the medial right abdomen. IMPRESSION: 1. Large amount of free intraperitoneal air throughout the upper abdomen bilaterally, new since 07/28/2018 CT abdomen study. Perforated viscus is the diagnosis of exclusion. 2. Nonobstructive bowel gas pattern. 3. Large colonic stool volume  constipation. 4. Suggesting stable elevation of the left hemidiaphragm with left basilar atelectasis. Critical Value/emergent results were called by telephone at the time of interpretation on 08/03/2018 at 11:22 am to Dr. Kathlen Mody , who verbally acknowledged these results. Electronically Signed   By: Delbert Phenix M.D.   On: 08/03/2018 11:26   Dg Kayleen Memos W/water Sol Cm  Result Date: 08/07/2018 CLINICAL DATA:  Gastric ulcer.  Pneumoperitoneum.  Rule out source. EXAM: WATER SOLUBLE UPPER GI SERIES TECHNIQUE: Single-column upper GI series was performed using water soluble contrast. CONTRAST:  OMNIPAQUE IOHEXOL 300 MG/ML  SOLN COMPARISON:  CT abdomen 08/06/2018 FLUOROSCOPY TIME:  Fluoroscopy Time:  2 minutes 42 second Radiation Exposure Index (if provided by the fluoroscopic device): Number of Acquired Spot Images: 0 FINDINGS: Patient with paraplegic.  Limited positioning was possible Mild esophageal dilatation and dysmotility. No esophageal stricture or mass. No ulcer or esophageal leak Gastric mucosa is normal. The  fundus is normal. Review of recent CT revealed some stranding lateral to the greater curve of the stomach. Gastric mucosa normal in this area without perforation. No ulcer or mass in the stomach. Limited evaluation of the body of the stomach. Antrum and duodenal bulb normal. Anterior wall of stomach limited evaluation due to difficulty with patient positioning IMPRESSION: Negative for perforation.  No gastric ulcer or mass. Exam somewhat limited by lack of mobility. Anterior wall the stomach not well evaluated. Electronically Signed   By: Marlan Palau M.D.   On: 08/07/2018 10:09   Vas [place] Lower Extremity Venous (dvt)  Result Date: 07/30/2018  Lower Venous Study Indications: History of DVT, evaluate for propagation.  Performing Technologist: Gertie Fey MHA, RDMS, RVT, RDCS  Examination Guidelines: A complete evaluation includes B-mode imaging, spectral Doppler, color Doppler, and  power Doppler as needed of all accessible portions of each vessel. Bilateral testing is considered an integral part of a complete examination. Limited examinations for reoccurring indications may be performed as noted.  Right Venous Findings: +---------+---------------+---------+-----------+----------+-------+          CompressibilityPhasicitySpontaneityPropertiesSummary +---------+---------------+---------+-----------+----------+-------+ CFV      None           Yes      Yes                  Acute   +---------+---------------+---------+-----------+----------+-------+ SFJ      None                                         Acute   +---------+---------------+---------+-----------+----------+-------+ FV Prox  None                                         Acute   +---------+---------------+---------+-----------+----------+-------+ FV Mid   None                                         Acute   +---------+---------------+---------+-----------+----------+-------+ FV DistalNone                                                 +---------+---------------+---------+-----------+----------+-------+ POP      None                    No                   Acute   +---------+---------------+---------+-----------+----------+-------+ PTV      None                    No                   Acute   +---------+---------------+---------+-----------+----------+-------+ PERO     None                    No                   Acute   +---------+---------------+---------+-----------+----------+-------+  Left Venous Findings: +---------+---------------+---------+-----------+----------+-------------------+          CompressibilityPhasicitySpontaneityPropertiesSummary             +---------+---------------+---------+-----------+----------+-------------------+  CFV      None                                                              +---------+---------------+---------+-----------+----------+-------------------+ SFJ      None                                                             +---------+---------------+---------+-----------+----------+-------------------+ FV Prox  None                                                             +---------+---------------+---------+-----------+----------+-------------------+ FV Mid   None                                                             +---------+---------------+---------+-----------+----------+-------------------+ FV DistalNone                                                             +---------+---------------+---------+-----------+----------+-------------------+ PFV      None                                                             +---------+---------------+---------+-----------+----------+-------------------+ POP      None                                                             +---------+---------------+---------+-----------+----------+-------------------+ PTV                                                   One vein is patent                                                        and compressible  with spontaneous                                                          flow. The other is                                                        thrombosed and                                                            occluded.           +---------+---------------+---------+-----------+----------+-------------------+ PERO                                                  One vein is patent                                                        with spontaneous                                                          flow. The other is                                                        thrombosed and                                                             occluded.           +---------+---------------+---------+-----------+----------+-------------------+    Summary: Bilateral lower extremities are positive for deep vein thrombosis involving bilateral saphenofemoral junctions, common femoral, femoral, popliteal, posterior tibial, and peroneal veins. A single left posterior tibial vein and single left peroneal vein are now patent. There is no evidence of Baker's cyst bilaterally.  *See table(s) above for measurements and observations. Electronically signed by Fabienne Bruns MD on 07/30/2018 at 9:26:05 AM.    Final    Vas [place] Upper Extremity Venous Duplex  Result Date: 08/11/2018 UPPER VENOUS STUDY  Indications: Edema PICC line. Extended hospitalization Limitations: Bandages, line and collar. Performing Technologist: Sherren Kerns RVS  Examination Guidelines: A  complete evaluation includes B-mode imaging, spectral Doppler, color Doppler, and power Doppler as needed of all accessible portions of each vessel. Bilateral testing is considered an integral part of a complete examination. Limited examinations for reoccurring indications may be performed as noted.  Right Findings: +----------+------------+----------+---------+-----------+-------+ RIGHT     CompressiblePropertiesPhasicitySpontaneousSummary +----------+------------+----------+---------+-----------+-------+ Subclavian                         Yes       Yes            +----------+------------+----------+---------+-----------+-------+  Left Findings: +----------+------------+----------+---------+-----------+--------------+ LEFT      CompressiblePropertiesPhasicitySpontaneous   Summary     +----------+------------+----------+---------+-----------+--------------+ IJV                                                 not visualized +----------+------------+----------+---------+-----------+--------------+ Subclavian    None                  No        No         Acute      +----------+------------+----------+---------+-----------+--------------+ Axillary      None                                      Acute      +----------+------------+----------+---------+-----------+--------------+ Brachial    Partial                                     Acute      +----------+------------+----------+---------+-----------+--------------+ Radial        None                                      Acute      +----------+------------+----------+---------+-----------+--------------+ Ulnar         Full                                                 +----------+------------+----------+---------+-----------+--------------+ Cephalic      None                                      Acute      +----------+------------+----------+---------+-----------+--------------+ Basilic       None                                      Acute      +----------+------------+----------+---------+-----------+--------------+  Summary:  Left: Findings consistent with acute deep vein thrombosis involving the left subclavian veins, left axillary vein, left brachial veins and left radial veins. Findings consistent with acute superficial vein thrombosis involving the left basilic vein and left cephalic vein.  *See table(s) above for measurements and observations.  Diagnosing physician: Lemar Livings MD Electronically signed by Lemar Livings MD on 08/11/2018 at 3:43:38 PM.    Final  Ct Angio Abd/pel W/ And/or W/o  Result Date: 07/28/2018 CLINICAL DATA:  embolism and thrombosis of inferior vena cavaNew PE. Has IVC filter in place. Is there a clot on the filter? No problems with cough or shortness of breath. He has had no bleeding. Platelet count is 58,000. His white cell count is 5.1. Hemoglobin is 9.7.The LDH is 394. This is stable.His fibrinogen as it was 221. His PTT prior to bivalirudin was 24 seconds. His INR is 1.25. Undefined coagulopathy. unusual  was a low fibrinogen level.Pt unable to hold arms above his head. EXAM: CTA ABDOMEN AND PELVIS WITH CONTRAST TECHNIQUE: Multidetector CT imaging of the abdomen and pelvis was performed using the venous protocol during bolus administration of intravenous contrast. Multiplanar reconstructed images and MIPs were obtained and reviewed to evaluate the vascular anatomy. CONTRAST:  ISOVUE-370 IOPAMIDOL (ISOVUE-370) INJECTION 76% COMPARISON:  07/15/2018 FINDINGS: VASCULAR Aorta: Normal caliber aorta without aneurysm, dissection, vasculitis or significant stenosis. Celiac: Patent without evidence of aneurysm, dissection, vasculitis or significant stenosis. SMA: Patent without evidence of aneurysm, dissection, vasculitis or significant stenosis. Renals: Both renal arteries are patent without evidence of aneurysm, dissection, vasculitis, fibromuscular dysplasia or significant stenosis. IMA: Patent without evidence of aneurysm, dissection, vasculitis or significant stenosis. Inflow: Patent.  No significant plaque, aneurysm, or stenosis. Proximal Outflow: Bilateral common femoral and visualized portions of the superficial and profunda femoral arteries are patent without evidence of aneurysm, dissection, vasculitis or significant stenosis. Veins: Hepatic veins patent. Portal vein patent. SMV and splenic vein patent. Infrarenal IVC filter stable in position. There is low-attenuation centrally below the apex of the filter consistent with some entrapped thrombus but the rest of the IVC enhances normally. There is no evidence of thrombus extension into the cava above the filter. Nonocclusive filling defect at the confluence of the left external and internal iliac veins consistent with DVT. Incompletely occlusive filling defects in bilateral common femoral veins right greater than left extending into the femoral venous system distally, incompletely visualized, consistent with DVT. Review of the MIP images confirms the above  findings. NON-VASCULAR Lower chest: Bibasilar consolidation/atelectasis left greater than right as before. No pleural or pericardial effusion. Hepatobiliary: No focal liver abnormality is seen. No gallstones, gallbladder wall thickening, or biliary dilatation. Pancreas: Unremarkable. No pancreatic ductal dilatation or surrounding inflammatory changes. Spleen: Normal in size without focal abnormality. Adrenals/Urinary Tract: Adrenal glands are unremarkable. Kidneys are normal, without renal calculi, focal lesion, or hydronephrosis. Bladder is unremarkable. Stomach/Bowel: Stomach and small bowel decompressed. Appendix not discretely identified. Moderate fecal material in the colon without dilatation or wall thickening. Lymphatic: No abdominal or pelvic adenopathy localized. Reproductive: Prostate is unremarkable. Other: No ascites.  No free air. Musculoskeletal: Spurring in the lower thoracic and lumbar spine with spondylitic changes most marked L3-S1. No fracture or worrisome bone lesion. IMPRESSION: VASCULAR 1. Bilateral lower extremity DVT and nonocclusive left iliac venous DVT. 2. Clot entrapped in the infrarenal IVC filter without caval occlusion or clot extension above the filter. 3. No significant arterial pathology. NON-VASCULAR 1. Persistent airspace opacities in the lung bases left greater than right. 2. No acute findings. Electronically Signed   By: Corlis Leak M.D.   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 2) Acute pulmonary embolism/DVT in the setting of spine surgery Status post IVC filter placement on March 28, 2018  recurrent surgical site hematomas Recurrent DVT and PE -Bibvalrudin switched  to Argatraban 11/29  -11/30 -right IJ central line placed, left arm PICC discontinued 3) Fever ?related to DVT vs bowel perf -Complete course of IV cefepime due to recent pneumoperitoneum  with 10 days on Tuesday 12/3 4)Pneumoperitoneum -General surgery consulted 5)Thrombocytopenia-improved PLAN -continue Argatroban per Dr Gustavo Lah recommendations -if no further surgical intervention planned, plt stable and no issues with bleeding will defer to dr Myna Hidalgo to decide if /when patient can transition back to po anticoagulation. -previous SRA assay neg for HIT syndrome -Dr Myna Hidalgo will continue to follow from tomorrow. -diuresis per hospital medicine   I spent 20 minutes counseling the patient face to face. The total time spent in the appointment was 25 minutes and more than 50% was on counseling and direct patient cares.    Wyvonnia Lora MD MS AAHIVMS [facility] Hematology/Oncology Physician [facility]  (Office):       [phone] (Work cell):  [phone] (Fax):           [phone]

## 2018-08-14 NOTE — Progress Notes (Signed)
PROGRESS NOTE    Akio Hudnall  NTI:[id number] DOB: July 03, 1966 DOA: 06/06/2018 PCP: Helane Rima, MD   Brief Narrative: 52/M with PMH of gout, hypertension, underwent anterior cervical corpectomy of C4 on 8/67/6195 with complicated postoperative course including development of multiple PEs and DVTs started on anticoagulation and subsequently developed C spinal hematoma requiring reexploration on 03/28/2018 status post placement of IVC filter then discharged to SNF -Patient was admitted to this hospital as a transfer from [facility] with MRI of C-spine that showed postoperative changes and cord compression with cord edema and concern for ischemia, s/p post posterior cervical decompression laminectomy of C3, C4, C5, removal of spinous processes, foraminotomies on 06/14/2018.  -On 06/22/2018 developed worsening left sided paresthesias and weakness MRI noted postoperative hematoma, was taken to the OR for evacuation of hematoma.  -Additionally developed Bilateral lower extremity DVTs -on 10/19 was noted to have decreased left hand grip on 10/19, MRI showed large epidural hematoma, patient s/p clot evacuation.  He was placed back on anticoagulation argatroban on 10-22. -HIT panel0.486 (mildly elevated, Serotonin release assay was negative. Dr Marin Olp following  -On 10/25; developed weakness of left arm, He was take emergently to OR for recurrence of epidural hematoma on 10-25.  He was taken off of anticoagulation.   -10/28 Night: developed left arm weakness, numbness. Stat MRI was ordered, which confirmed recurrent hematoma. Patient was taken to OR for hematoma evacuation 10-28.  -For his thrombocytopenia, Heme Dr.Ennever following, Bone marrow biopsy was done. For LE DVT, he has IVC filter. Patient has received multiple platelet transfusions and Nplate.  Patient underwent CT scan of his chest on 11/13 which showed new pulmonary embolism.  Patient was started on bivalirudin after discussions  between hematology as well as neurosurgery.  Platelet counts have improved.  On 11/22 pt was found to have free intraabdominal air, it was followed with CT abd pelvis , shows possible stranding at the greater curvature of the stomach.  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  -Complicated postop course -Status post cervical decompressive surgery on 06/14/2018 -Status post evacuation of large hematoma and placement of Hemovac drain on 06/22/2018. -After this patient had multiple more clot evacuation procedures on 10/19, 10/25, 10/28  -Repeat MRI on 07/23/2018 shows improvement in the fluid collection.  Neurosurgery continues to follow -now stable per NSG from spine standpoint -Needs aggressive rehab, plan for SNF when stable  Acute pulmonary embolism/DVT in the setting of spine surgery Status post IVC filter placement on March 28, 2018 patient has been on multiple anticoagulants which has led to recurrent episodes of hematomas at the surgical sites requiring further evacuations.   -He was kept off anticoagulation for a brief period, due to recurrent bleeding/hematomas -However CT chest on 11/13 showed new pulmonary embolism with evidence of right heart.  He was subsequently started on bivalrudin, platelet counts are improving.   -11/28 with worsening left arm swelling, Dopplers noted acute DVT in left subclavian axillary, brachial and radial veins, and superficial thrombus in left cephalic and basilic veins -Bibvalrudin switched to [place] 11/29  -11/30 -right IJ central line placed, left arm PICC discontinued -Keep left arm elevated -Hopefully can transition from  Argatroban to Eliquis soon,  platelet count close to normal range  Fever 11/28-11/30 -Suspect this is related to extensive left upper extremity DVTs, anticoagulation switched over to her Argatrban, blood cultures are negative -Also complete course of IV cefepime due to recent pneumoperitoneum with 10 days on Tuesday 12/3 -fever curve improving  Pneumoperitoneum -Incidentally noted to have pneumoperitoneum on 11/24 -General surgery consulted -CT abdomen pelvis 11/24 with soft tissue stranding adjacent to the greater curvature of the body of the stomach, upper GI series on 1125 was negative for leak/perforation -At this time remains afebrile on broad-spectrum antibiotics, tolerating diet and having bowel movements -Suspect he might have had a microperforation which has sealed off -Complete a 10-day course of antibiotics, continue PPI for stress ulcer prophylaxis  Thrombocytopenia/coagulopathy Patient had extensive work-up for acute thrombocytopenia.   cardiolipin antibody lupus anticoagulant, homocysteine levels factor VIII levels all within normal limits.  Protein S activity was low, protein C activity was greater than 200.  HIT panel was mildly elevated but serotonin release assay was negative.  Over the course of his hospitalization patient had multiple platelet transfusions and his platelet count has slowly increased with endplate infusions on weekly basis.   -Oncology following. -thrombocytopenia has resolved  Fluid overload with upper and lower extremity edema  -with Third spacing -continue IV Lasix,  with potassium, diuresed 4L yesterday -continue diuretics as long as BP and kidney function tolerates  Hyperglycemia secondary to steroids Currently on Lantus and sliding scale insulin. Hemoglobin A1c was 5.8 .  Anemia due to acute illness -Stable, improving  Sinus bradycardia - Is currently asymptomatic, TSH was within normal limits.  - Plan to avoid AV nodal blocking  agents.  Adjustment disorder with depression and anxiety -Continue klonopin as needed  Essential hypertension  -well controlled  Acute renal failure Resolved  Stage II pressure ulcer on the right ischial tuberosity not present on admission  wound care consulted and recommendations given.  Oropharyngeal candidiasis -Completed course of fluconazole.  Acute diarrhea Resolved   DVT prophylaxis: Argatraban Code Status:  Full code Family Communication: no family at bedside.  Disposition Plan: Will need prolonged rehab   Consultants:   NS Dr Saintclair Halsted  Hematology  Dr Marin Olp.   Cardiology.   Surgery Dr Donne Hazel.    Procedures:  On 10/2 1. posterior cervical decompressive laminectomy at C3, C4, C5 with removal of the spinous process and complete laminectomy and foraminotomies of the C3, C4, C5 nerve roots. 2. Posterior cervical fusion with lateral mass screws at C3-C4-C5 and pars screws at C2 lies in the globus ellipseLateral mass screw system 3. Posterior lateral arthrodesis C2-C5 utilizing locally harvested autograft mixed with vivigen  06/22/2018: Procedure reexploration posterior cervical wound for evacuation of epidural hematoma  06/26/2018 DVT ultrasound:Right: Findings consistent with acute deep vein thrombosis involving the right common femoral vein, right femoral vein, right popliteal vein, right posterior tibial vein, and right peroneal vein. Thrombosis extends proximally into external iliac vein.  Proximal iliac vein not visualized. Left: Findings consistent with acute deep vein thrombosis involving the left common femoral vein, left femoral vein, left popliteal vein, left posterior tibial vein, and left peroneal vein. Findings consistent with acute superficial vein thrombosis  involving the left great saphenous vein. Thrombosis extends proximally into external iliac vein. Proximal iliac vein not visualized.  07/01/18 Clot evacuation   RUE doppler  07/02/18 Antimicrobials: vancomycin and zosyn since 11/23 for sepsis and abdominal infection.   Subjective: -Continues to have incontinence stools  Objective: Vitals:   08/13/18 2300 08/14/18 0300  08/14/18 0400 08/14/18 0800  BP: 122/74 126/81 136/87 122/79  Pulse: (!) 105 (!) 102 (!) 102 (!) 111  Resp: (!) 28 (!) 21  10  Temp: 98.8 F (37.1 C) 97.8 F (36.6 C)  99.4 F (37.4 C)  TempSrc: Oral Oral  Oral  SpO2: 97% 98% 98% 96%  Weight:      Height:        Intake/Output Summary (Last 24 hours) at 08/14/2018 1233 Last data filed at 08/14/2018 1200 Gross per 24 hour  Intake 667.02 ml  Output 5475 ml  Net -4807.98 ml   Filed Weights   07/07/18 1000 07/18/18 0600 07/27/18 1502  Weight: 123.8 kg 135 kg 127.6 kg    Examination: Gen: Awake, Alert, Oriented X 3, chronically ill, obese male, sitting in bed HEENT: PERRLA, Neck supple, no JVD Lungs: Decreased breath sounds at both bases CVS: S1-S2/regular rate rhythm Abd: soft, Non tender, non distended, BS present Extremities: 2+ edema, left upper arm swelling improving Skin: no new rashes Neuro: awake alert, oriented x4, right upper extremity strength is 5 x 5, left upper extremity has diminished proximal muscle strength and 4+ at the wrist -Able to wiggle his toes on the right foot, no movements noted in the left leg  Data Reviewed: I have personally reviewed following labs and imaging studies  CBC: Recent Labs  Lab 08/10/18 0408 08/11/18 0639 08/12/18 0320 08/13/18 0628 08/14/18 0426  WBC 9.9 9.8 11.8* 10.7* 11.0*  HGB 9.1* 8.9* 7.6* 8.5* 9.3*  HCT 30.0* 29.1* 25.8* 27.4* 30.3*  MCV 95.8 96.7 98.5 96.8 95.9  PLT 149* 149* 183 137* 622   Basic Metabolic Panel: Recent Labs  Lab 08/08/18 0545 08/11/18 0639 08/12/18 0320 08/14/18 0426  NA 137 140 139 138  K 2.6* 3.8 3.9 3.8  CL 103 111 106 101  CO2 '27 24 24 30  ' GLUCOSE 75 109* 103* 94  BUN '13 11 14 12  ' CREATININE 0.62 0.58* 0.62 0.69  CALCIUM 8.4* 8.7*  8.8* 8.9   GFR: Estimated Creatinine Clearance: 142.8 mL/min (by C-G formula based on SCr of 0.69 mg/dL). Liver Function Tests: No results for input(s): AST, ALT, ALKPHOS, BILITOT, PROT, ALBUMIN in the last 168 hours. No results for input(s): LIPASE, AMYLASE in the last 168 hours. No results for input(s): AMMONIA in the last 168 hours. Coagulation Profile: No results for input(s): INR, PROTIME in the last 168 hours. Cardiac Enzymes: No results for input(s): CKTOTAL, CKMB, CKMBINDEX, TROPONINI in the last 168 hours. BNP (last 3 results) No results for input(s): PROBNP in the last 8760 hours. HbA1C: No results for input(s): HGBA1C in the last 72 hours. CBG: Recent Labs  Lab 08/13/18 1224 08/13/18 1733 08/13/18 2126 08/14/18 0723 08/14/18 1134  GLUCAP 145* 115* 110* 76 90   Lipid Profile: No results for input(s): CHOL, HDL, LDLCALC, TRIG, CHOLHDL, LDLDIRECT in the last 72 hours. Thyroid Function Tests: No results for input(s): TSH, T4TOTAL, FREET4, T3FREE, THYROIDAB in the last 72 hours. Anemia Panel: No results for input(s): VITAMINB12, FOLATE, FERRITIN, TIBC, IRON, RETICCTPCT in the last 72 hours. Sepsis Labs: No results for input(s): PROCALCITON, LATICACIDVEN in the last 168 hours.  Recent Results (from the past 240 hour(s))  Culture, blood (Routine X 2) w Reflex to ID Panel     Status: None   Collection Time: 08/05/18  9:40 AM  Result Value Ref Range Status   Specimen Description BLOOD RIGHT ANTECUBITAL  Final   Special Requests   Final  BOTTLES DRAWN AEROBIC ONLY Blood Culture adequate volume   Culture   Final    NO GROWTH 5 DAYS Performed at [facility], [place] [address] [place], [place] [address]    Report Status 08/10/2018 FINAL  Final  Culture, blood (Routine X 2) w Reflex to ID Panel     Status: None   Collection Time: 08/05/18  9:47 AM  Result Value Ref Range Status   Specimen Description BLOOD RIGHT ANTECUBITAL  Final   Special Requests   Final     BOTTLES DRAWN AEROBIC ONLY Blood Culture adequate volume   Culture   Final    NO GROWTH 5 DAYS Performed at [facility], [place] [address] [place], [place] [address]    Report Status 08/10/2018 FINAL  Final  Culture, blood (routine x 2)     Status: None (Preliminary result)   Collection Time: 08/11/18  8:44 AM  Result Value Ref Range Status   Specimen Description BLOOD RIGHT ANTECUBITAL  Final   Special Requests   Final    BOTTLES DRAWN AEROBIC AND ANAEROBIC Blood Culture adequate volume   Culture   Final    NO GROWTH 3 DAYS Performed at [facility], [place] [address], [place], [place] [address]    Report Status PENDING  Incomplete  Culture, blood (routine x 2)     Status: None (Preliminary result)   Collection Time: 08/11/18  8:44 AM  Result Value Ref Range Status   Specimen Description BLOOD RIGHT HAND  Final   Special Requests   Final    BOTTLES DRAWN AEROBIC ONLY Blood Culture adequate volume   Culture   Final    NO GROWTH 3 DAYS Performed at [facility], [place] [address] [place], [place] [address]    Report Status PENDING  Incomplete         Radiology Studies: Dg Chest Port 1 View  Result Date: 08/12/2018 CLINICAL DATA:  Right central line placement EXAM: PORTABLE CHEST 1 VIEW COMPARISON:  08/09/2018 FINDINGS: Right IJ central line tip mid SVC level. Left PICC line tip in a similar position. Exam is rotated to the right. Mild cardiomegaly. Low lung volumes persist with basilar atelectasis, worse on the right. No enlarging effusion or pneumothorax. Aorta atherosclerotic. Similar lucency of the upper abdomen compatible with known pneumoperitoneum. IMPRESSION: Right IJ central line tip mid SVC level. Persistent low lung volumes with basilar atelectasis Upper abdominal pneumoperitoneum as before. Electronically Signed   By: Jerilynn Mages.  Shick M.D.   On: 08/12/2018 14:59        Scheduled Meds: . sodium chloride   Intravenous Once  . bismuth subsalicylate  30 mL  Oral TID AC & HS  . Chlorhexidine Gluconate Cloth  6 each Topical Q0600  . clonazePAM  0.5 mg Oral BID  . collagenase   Topical Daily  . famotidine  20 mg Oral Daily  . feeding supplement (ENSURE ENLIVE)  237 mL Oral BID BM  . furosemide  40 mg Intravenous Q12H  . gabapentin  300 mg Oral TID  . Gerhardt's butt cream   Topical BID  . Influenza vac split quadrivalent PF  0.5 mL Intramuscular Tomorrow-1000  . insulin aspart  0-15 Units Subcutaneous TID WC  . insulin aspart  0-5 Units Subcutaneous QHS  . insulin glargine  20 Units Subcutaneous QHS  . magic mouthwash w/lidocaine  5 mL Oral TID  . midodrine  5 mg Oral BID WC  . multivitamin with  minerals  1 tablet Oral Daily  . potassium chloride  40 mEq Oral BID  . sodium chloride flush  10-40 mL Intracatheter Q12H   Continuous Infusions: . sodium chloride 10 mL/hr at 08/14/18 0800  . argatroban 1 mcg/kg/min (08/14/18 1231)  . ceFEPime (MAXIPIME) IV 1 g (08/14/18 0500)     LOS: 13 days   Domenic Polite, MD [facility] Page via [email or web address]  If 7PM-7AM, please contact night-coverage [email or web address] Password [facility] 08/14/2018, 12:33 PM

## 2018-08-14 NOTE — Progress Notes (Signed)
Occupational Therapy Treatment Patient Details Name: [name] [name] MRN: [id number] DOB: 06-Nov-1965 Today's Date: 08/14/2018    History of present illness 52 y.o. male admitted on 06/06/18 for bil LE weakness and muscle spasm after multiple falls (with resultant R knee meniscus tear placed in bledsoe brace) following a recent ACDF at C3 in July 2019 (complicated by PE (s/p IVC filter), CIR, and then SNF placement before finally returning home with his parents).  In the ED MRI of c-spine which showed post operative changes and cord edema and possibility of ischemia.  His R knee was aspirated on 06/09/18.  S/p posterior cervical decompression and fusion on 06/14/18. Entire acute course complicated by hypotension in standing (thought to be autonomic) and by finding of multiple LE DVTs (already has an IVC filter in place, but per MDs notes at risk for blocking the filter with these clots).  Pt with post op issues with incisional bleeding s/p 2 evacuations of hematomas and placement of hemovac (06/22/18 and 07/01/18) as well as multiple units of plasma.  Cardiology consulted 07/05/18 due to bradycardia thought to be from his newly started blood thinner.  Pt with another re-accumulation of epidural hematoma with increased L sided weakness and cord compression with emergent evacuation surgery 10/25 & 07/10/18.  Pt also needing more platelets post op.  Bone marrow bx 07/12/18.  Pt found to have submassive PE on 07/27/18 and re-started on anticoagulation. PMHx of HTN, gout, anemia, DDD (lumbar).  08/10/18 found to have a L UE thrombus (same arm as his PICC line).     OT comments  Worked in conjunction with PT this session for progression of activity while sitting EOB. Session focus includes working on sitting balance, UB stretching, scapular retraction to increase overall sitting posture. Pt requiring totalA+2 for bed mobility and overall maxA for sitting balance EOB, tolerating sitting EOB >8715min. Pt initially with  flat affect and with continued frustrations regarding overall prognosis. As session progressed and by end of session pt with improved mood and appreciative of working with therapy. Pt with edemous LUE and repositioned for edema management end of session. Will continue to follow for progression towards OT goals.   Follow Up Recommendations  SNF;Supervision/Assistance - 24 hour    Equipment Recommendations  Other (comment)          Precautions / Restrictions Precautions Precautions: Fall;Cervical;Other (comment) Precaution Comments: Several bed sores on bottom.   Required Braces or Orthoses: Cervical Brace Cervical Brace: Soft collar Restrictions Weight Bearing Restrictions: No       Mobility Bed Mobility Overal bed mobility: Needs Assistance Bed Mobility: Rolling Rolling: +2 for physical assistance;Total assist Sidelying to sit: +2 for physical assistance;Total assist     Sit to sidelying: +2 for physical assistance;Total assist    Transfers                 General transfer comment: deferred this session due to continued pressure sores in buttocks region    Balance Overall balance assessment: Needs assistance Sitting-balance support: Feet unsupported;Bilateral upper extremity supported;Single extremity supported;No upper extremity supported Sitting balance-Leahy Scale: Zero Sitting balance - Comments: maxA to sit EOB; sat EOB >15 min during session Postural control: Posterior lean;Left lateral lean                                 ADL either performed or assessed with clinical judgement   ADL Overall ADL's : Needs assistance/impaired  Lower Body Dressing: Total assistance Lower Body Dressing Details (indicate cue type and reason): donning socks               General ADL Comments: focus of session on bed mobility and sitting balance/activity EOB     Vision       Perception     Praxis      Cognition  Arousal/Alertness: Awake/alert Behavior During Therapy: WFL for tasks assessed/performed Overall Cognitive Status: Within Functional Limits for tasks assessed                                 General Comments: pt with flat affect and frustrated with overall status at start of session, improved affect as session progressed        Exercises Exercises: Other exercises Other Exercises Other Exercises: worked on sitting balance EOB, scapular retraction, lateral leans and wt bearing through R/L elbow; strectching neck to promote more upright posture and working on turning head towards L/R    Shoulder Instructions       General Comments      Pertinent Vitals/ Pain       Pain Assessment: Faces Faces Pain Scale: Hurts little more Pain Location: LUE, neck with certain movements Pain Descriptors / Indicators: Aching;Sore Pain Intervention(s): Monitored during session;Repositioned  Home Living                                          Prior Functioning/Environment              Frequency  Min 2X/week        Progress Toward Goals  OT Goals(current goals can now be found in the care plan section)  Progress towards OT goals: Progressing toward goals  Acute Rehab OT Goals Patient Stated Goal: to get his medical conditions figured out OT Goal Formulation: With patient Time For Goal Achievement: 08/22/18 Potential to Achieve Goals: Fair  Plan Discharge plan remains appropriate    Co-evaluation    PT/OT/SLP Co-Evaluation/Treatment: Yes Reason for Co-Treatment: Complexity of the patient's impairments (multi-system involvement);For patient/therapist safety;To address functional/ADL transfers;Other (comment)(to progress activity level EOB)   OT goals addressed during session: Strengthening/ROM      AM-PAC OT "6 Clicks" Daily Activity     Outcome Measure   Help from another person eating meals?: A Little Help from another person taking care of  personal grooming?: A Little Help from another person toileting, which includes using toliet, bedpan, or urinal?: Total Help from another person bathing (including washing, rinsing, drying)?: A Lot Help from another person to put on and taking off regular upper body clothing?: Total Help from another person to put on and taking off regular lower body clothing?: Total 6 Click Score: 11    End of Session Equipment Utilized During Treatment: Cervical collar  OT Visit Diagnosis: Other abnormalities of gait and mobility (R26.89);Muscle weakness (generalized) (M62.81);Pain   Activity Tolerance Patient tolerated treatment well   Patient Left in bed;with call bell/phone within reach   Nurse Communication Mobility status        Time: 9604-5409 OT Time Calculation (min): 66 min  Charges: OT General Charges $OT Visit: 1 Visit OT Treatments $Therapeutic Activity: 23-37 mins  Marcy Siren, OT [facility] Services Pager [phone] Office [phone]    Orlando Penner 08/14/2018, 5:16 PM

## 2018-08-14 NOTE — Progress Notes (Signed)
It seems like the problem now, outside of the fact that he keeps having thromboembolic events, is that he is having bad diarrhea.  He has been going many times a day.  I am not sure as to why he is having this issue.  I do not see that he is on anything for stool softener or any kind of laxative.  I looked at his medications and I do not see that he is on anything outside of some antibiotics.  I do not think that he has C. difficile.  The diarrhea is really bothering him.  He has skin breakdown in the anal area according to his nurse.  I will try him on some Kaopectate and see if this can help a little bit.  He is on antibiotics.  Again, I do not think that this diarrhea is C. difficile.  It does not seem to have the quality of C. difficile.  As far as him having another thrombus, despite being on bivalirudin, I do not have an explanation for this.  I will check his homocystine level.  This might be one possibility.  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  However, the one that he has really is not associated with thrombotic development.  He certainly does not have any problems with shortness of breath.  He is a little bit tachycardic.  He does have quite a bit of swelling in the left arm.  The PICC line was taken out.  He has some swelling in his legs.  For the entire admission, he is 35L out.  As such, I do not think that volume overload would be a problem.  I just feel bad that neurologically he is not getting better.  I am not sure that he will be able to get better.  I am not sure if he will be able to get to the spinal cord rehab center in [place].  This really would be a great idea for him.  His CBC today shows a white cell count of 11 hemoglobin 9.3 and platelet count 160,000.  If he does have an elevated homocystine level, now would put him on folic acid.  I just feel bad for him.  I know he is very frustrated with the fact that there is no progress that he can see as  being made with his overall physical status.  He is eating regular diet.  As such, I might consider switch him over to an oral anticoagulant.  I would keep him on the argatroban until we can see what his homocystine level is.  Christin BachPete , MD  Proverbs 16:32

## 2018-08-14 NOTE — Social Work (Signed)
CSW received call from [facility] case manager Ocie CornfieldKim Hall, [phone] ext [phone] regarding care planning for pt. Returned call, await follow up.  Octavio GravesIsabel Weber Monnier, MSW, [facility]WA [place] Clinical Social Work [phone]

## 2018-08-14 NOTE — Consult Note (Signed)
WOC Nurse wound consult note Reason for Consult: Full thickness breakdown to bilateral ischial tuberosities.  Pressure and moisture.  Patient insists on head of bed being straight up and patient is experiencing copious diarrhea.  Too firm for fecal management system and patient stated it was too uncomfortable when stool was more loose.  Wound type:Pressure and moisture Pressure Injury POA: No Measurement:Right ischium:  0.5 cm x 0.5 cm 10% slough LEft ishcium:  Directly in skin fold:  1 cm x 4 cm with 50% adherent slough  Wound ZOX:[id number]:devitalized tissue Drainage (amount, consistency, odor) scant serosanguinous  Periwound:intact  Frequent exposure to moisture.  Dressing procedure/placement/frequency: Cleanse wounds to bilateral ischial area with NS and pat dry.  Apply Santyl to wound bed. Cover with NS moist gauze and ABD pad.  Change daily and PRN soilage. If becoming frequently contaminated may switch to foam dressing for topper.  Will not follow at this time.  Please re-consult if needed.  Maple HudsonKaren Staphany Ditton MSN, RN, FNP-BC CWON Wound, Ostomy, Continence Nurse Pager [phone]

## 2018-08-15 LAB — CBC
HEMATOCRIT: 29.4 % — AB (ref 39.0–52.0)
Hemoglobin: 8.8 g/dL — ABNORMAL LOW (ref 13.0–17.0)
MCH: 29.1 pg (ref 26.0–34.0)
MCHC: 29.9 g/dL — ABNORMAL LOW (ref 30.0–36.0)
MCV: 97.4 fL (ref 80.0–100.0)
Platelets: 172 10*3/uL (ref 150–400)
RBC: 3.02 MIL/uL — ABNORMAL LOW (ref 4.22–5.81)
RDW: 20.2 % — AB (ref 11.5–15.5)
WBC: 10.6 10*3/uL — AB (ref 4.0–10.5)
nRBC: 1.4 % — ABNORMAL HIGH (ref 0.0–0.2)

## 2018-08-15 LAB — BASIC METABOLIC PANEL
Anion gap: 8 (ref 5–15)
BUN: 14 mg/dL (ref 6–20)
CO2: 30 mmol/L (ref 22–32)
Calcium: 9.1 mg/dL (ref 8.9–10.3)
Chloride: 102 mmol/L (ref 98–111)
Creatinine, Ser: 0.61 mg/dL (ref 0.61–1.24)
GFR calc Af Amer: >60 mL/min (ref >60)
GFR calc non Af Amer: >60 mL/min (ref >60)
Glucose, Bld: 101 mg/dL — ABNORMAL HIGH (ref 70–99)
Potassium: 4 mmol/L (ref 3.5–5.1)
Sodium: 140 mmol/L (ref 135–145)

## 2018-08-15 LAB — HOMOCYSTEINE: Homocysteine: 10.2 umol/L (ref 0.0–15.0)

## 2018-08-15 LAB — GLUCOSE, CAPILLARY
Glucose-Capillary: 87 mg/dL (ref 70–99)
Glucose-Capillary: 100 mg/dL — ABNORMAL HIGH (ref 70–99)
Glucose-Capillary: 93 mg/dL (ref 70–99)
Glucose-Capillary: 141 mg/dL — ABNORMAL HIGH (ref 70–99)

## 2018-08-15 LAB — APTT: aPTT: 82 seconds — ABNORMAL HIGH (ref 24–36)

## 2018-08-15 MED ORDER — SODIUM CHLORIDE 0.9 % IV SOLN
510.0000 mg | Freq: Once | INTRAVENOUS | Status: AC
  Administered 2018-08-15: 510 mg via INTRAVENOUS
  Filled 2018-08-15: qty 17

## 2018-08-15 MED ORDER — ENSURE ENLIVE PO LIQD
237.0000 mL | Freq: Three times a day (TID) | ORAL | Status: DC
  Administered 2018-08-15 – 2018-09-10 (×68): 237 mL via ORAL
  Filled 2018-08-15: qty 237

## 2018-08-15 NOTE — Progress Notes (Signed)
Nutrition Follow-up  DOCUMENTATION CODES:   Morbid obesity  INTERVENTION:  Provide Ensure Enlive po TID, each supplement provides 350 kcal and 20 grams of protein.  Encourage adequate PO intake.   NUTRITION DIAGNOSIS:   Increased nutrient needs related to wound healing, post-op healing as evidenced by estimated needs; ongoing  GOAL:   Patient will meet greater than or equal to 90% of their needs; progressing  MONITOR:   PO intake, Supplement acceptance, Labs, Weight trends, Skin, I & O's  REASON FOR ASSESSMENT:   Low Braden    ASSESSMENT:   [name] [name] is a 52 y.o. male with history of hypertension who has had a recent ACDF at C3 C5 by Dr. Saintclair Halsted in July 3762 complicated by pulmonary embolism was placed on apixaban subsequently discharged to rehab and patient had moved to [place] area to [place] and has been living with his parents.  Had gone to ER around August 27 for complaints of increasing weakness and at the time was found to be febrile and was admitted for possible pneumonia.  He started antibiotic as per the ER physician with whom I discussed.  Patient was eventually discharged to rehab and patient was discharged from rehab last week.  Patient had come to [facility] for possible IVC filter placement for his known DVT and on reaching back home in [place] patient on trying to get out of the car started finding it difficult to walk and his legs gave away.  He also states he inadvertently turned his neck.  Following which patient has been having increasing weakness difficult to walk.  Prior to which patient was able to walk up the stairs.  Denies any incontinence of urine or bowel.  Neck pain has been chronic.  Denies any chest pain or shortness of breath.  9/27- s/p rt knee aspiration and injections 10/2- s/pposterior cervical decompressive laminectomy at C3, C4, C5 with removal of the spinous process and complete laminectomy and foraminotomies of the C3, C4, C5 nerve  roots;Posterior cervical fusion with lateral mass screws at C3-C4-C5 and pars screws at C2 lies in the globus ellipseLateral mass screw system;Posterior lateral arthrodesis C2-C5 utilizing locally harvested autograft mixed with vivigen 10/10- s/preexploration posterior cervical wound for evacuation of epidural hematoma 10/19- s/p cervical wound revision with evacuation of cervical epidural hematoma 10/25- s/preexploration of posterior surgical cervical wound for evacuation of epidural hematoma 10/29- s/p exploration of posterior cervical wound and evacuation of epidural hematoma 10/30- s/p bone marrow biopsy  11/22  Pt withfree intraabdominal air.  CT abd pelvis , shows possible stranding at the greater curvature of the stomach. GI series did not show obstruction.   11/30 s/p R IJ TLC, PICC removed  Pt continues on regular diet and has been tolerating it. Pt currently has Ensure ordered and has been consuming most of them. RD to increase Ensure to TID to aid in increased caloric and protein needs as well as in healing. Labs and medications reviewed. Noted pt with fluid overload with edema in extremities. Pt continues to diurese.   Diet Order:   Diet Order            Diet regular Room service appropriate? Yes; Fluid consistency: Thin  Diet effective now              EDUCATION NEEDS:   Education needs have been addressed  Skin:  Skin Assessment: Skin Integrity Issues: Skin Integrity Issues:: Stage II, Stage III Stage II: R ischial tuberosity Stage III: L ischial  Incisions: N/A  Last BM:  12/2  Height:   Ht Readings from Last 1 Encounters:  06/22/18 _0  (1.753 m)    Weight:   Wt Readings from Last 1 Encounters:  07/27/18 127.6 kg    Ideal Body Weight:  72.7 kg  BMI:  Body mass index is 41.54 kg/m.  Estimated Nutritional Needs:   Kcal:  2100-2300  Protein:  145-160 grams  Fluid:  > 2.1 L    [name] Parker, MS, RD, LDN Pager # [phone] After hours/  weekend pager # [phone]

## 2018-08-15 NOTE — Plan of Care (Signed)

## 2018-08-15 NOTE — Progress Notes (Signed)
PROGRESS NOTE    [name] [name]  ELT:[id number] DOB: 01/31/1966 DOA: 06/06/2018 PCP: Helane Rima, MD   Brief Narrative: 52/M with PMH of gout, hypertension, underwent anterior cervical corpectomy of C4 on 5/68/6168 with complicated postoperative course including development of multiple PEs and DVTs started on anticoagulation and subsequently developed C spinal hematoma requiring reexploration on 03/28/2018 status post placement of IVC filter then discharged to SNF -Patient was admitted to this hospital as a transfer from [facility] with MRI of C-spine that showed postoperative changes and cord compression with cord edema and concern for ischemia, s/p post posterior cervical decompression laminectomy of C3, C4, C5, removal of spinous processes, foraminotomies on 06/14/2018.  -On 06/22/2018 developed worsening left sided paresthesias and weakness MRI noted postoperative hematoma, was taken to the OR for evacuation of hematoma.  -Additionally developed Bilateral lower extremity DVTs -on 10/19 was noted to have decreased left hand grip on 10/19, MRI showed large epidural hematoma, patient s/p clot evacuation.  He was placed back on anticoagulation argatroban on 10-22. -HIT panel0.486 (mildly elevated, Serotonin release assay was negative. Dr Marin Olp following  -On 10/25; developed weakness of left arm, He was take emergently to OR for recurrence of epidural hematoma on 10-25.  He was taken off of anticoagulation.   -10/28 Night: developed left arm weakness, numbness. Stat MRI was ordered, which confirmed recurrent hematoma. Patient was taken to OR for hematoma evacuation 10-28.  -For his thrombocytopenia, Heme Dr.Ennever following, Bone marrow biopsy was done. For LE DVT, he has IVC filter. Patient has received multiple platelet transfusions and Nplate.  Patient underwent CT scan of his chest on 11/13 which showed new pulmonary embolism.  Patient was started on bivalirudin after discussions  between hematology as well as neurosurgery.  Platelet counts have improved.  On 11/22 pt was found to have free intraabdominal air, it was followed with CT abd pelvis , shows possible stranding at the greater curvature of the stomach.  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  -Complicated postop course -Status post cervical decompressive surgery on 06/14/2018 -Status post evacuation of large hematoma and placement of Hemovac drain on 06/22/2018. -After this patient had multiple more clot evacuation procedures on 10/19, 10/25, 10/28  -Repeat MRI on 07/23/2018 shows improvement in the fluid collection.  Neurosurgery continues to follow -remains stable from the standpoint -Needs aggressive rehab, plan for SNF when stable  Acute pulmonary embolism/DVT in the setting of spine surgery Status post IVC filter placement on March 28, 2018 patient has been on multiple anticoagulants which has led to recurrent episodes of hematomas at the surgical sites requiring further evacuations.   -He was kept off anticoagulation for a brief period, due to recurrent bleeding/hematomas -However CT chest on 11/13 showed new pulmonary embolism with evidence of right heart.  He was subsequently started on bivalrudin, platelet counts are improving.   -11/28 with worsening left arm swelling, Dopplers noted acute DVT in left subclavian axillary, brachial and radial veins, and superficial thrombus in left cephalic and basilic veins -Bibvalrudin switched to [place] 11/29  -11/30 -right IJ central line placed, left arm PICC discontinued -Keep left arm elevated -Appreciate Dr. Antonieta Pert input, plan to  transition to oral anticoagulation  this week  Fever 11/28-11/30 -Suspect this is related to extensive left upper extremity DVTs, anticoagulation switched over to her Argatrban, blood cultures are negative -Also complete course of IV cefepime due to recent pneumoperitoneum with 10 days on Tuesday 12/3 -fever curve improving, discontinue cefepime today  Pneumoperitoneum -Incidentally noted to have pneumoperitoneum on 11/24 -General surgery consulted -CT abdomen pelvis 11/24 with soft tissue stranding adjacent to the greater curvature of the body of the stomach, upper GI series on 1125 was negative for leak/perforation -At this time remains afebrile on broad-spectrum antibiotics, tolerating diet and having bowel movements -Suspect he might have had a microperforation which has sealed off -Complete a 10-day course of IV cefepime today, continue PPI for stress ulcer prophylaxis  Thrombocytopenia/coagulopathy Patient had extensive work-up for acute thrombocytopenia.   cardiolipin antibody lupus anticoagulant, homocysteine levels factor VIII levels all within normal limits.  Protein S activity was low, protein C activity was greater than 200.  HIT panel was mildly elevated but serotonin release assay was negative.  Over the course of his hospitalization patient had multiple platelet transfusions and his platelet count has slowly increased with endplate infusions on weekly basis.   -Oncology following. -thrombocytopenia has resolved  Fluid overload with upper and lower extremity edema  -with Third spacing -continue IV Lasix,  with potassium, diuresed 8 L in 48 hours  -continue diuretics as long as BP and kidney function tolerates  Hyperglycemia secondary to steroids Currently on Lantus and sliding scale insulin. Hemoglobin A1c was 5.8 .  Anemia due to acute illness -Stable, improving  Sinus bradycardia - Is currently asymptomatic, TSH was within normal limits.  - Plan to avoid AV nodal  blocking agents.  Adjustment disorder with depression and anxiety -Continue klonopin as needed  Essential hypertension  -well controlled  Acute renal failure Resolved  Stage II pressure ulcer on the right ischial tuberosity not present on admission  wound care consulted and recommendations given.  Oropharyngeal candidiasis -Completed course of fluconazole.  Acute diarrhea Resolved  DVT prophylaxis: Argatraban Code Status:  Full code Family Communication: no family at bedside.  Disposition Plan: Will need prolonged rehab   Consultants:   NS Dr Saintclair Halsted  Hematology  Dr Marin Olp.   Cardiology.   Surgery Dr Donne Hazel.    Procedures:  On 10/2 1. posterior cervical decompressive laminectomy at C3, C4, C5 with removal of the spinous process and complete laminectomy and foraminotomies of the C3, C4, C5 nerve roots. 2. Posterior cervical fusion with lateral mass screws at C3-C4-C5 and pars screws at C2 lies in the globus ellipseLateral mass screw system 3. Posterior lateral arthrodesis C2-C5 utilizing locally harvested autograft mixed with vivigen  06/22/2018: Procedure reexploration posterior cervical wound for evacuation of epidural hematoma  06/26/2018 DVT ultrasound:Right: Findings consistent with acute deep vein thrombosis involving the right common femoral vein, right femoral vein, right popliteal vein, right posterior tibial vein, and right peroneal vein. Thrombosis extends proximally into external iliac vein.  Proximal iliac vein not visualized. Left: Findings consistent with acute deep vein thrombosis involving the left common femoral vein, left femoral vein, left popliteal vein, left posterior tibial vein, and left peroneal vein. Findings consistent with acute superficial vein thrombosis  involving the left great saphenous vein. Thrombosis extends proximally into external iliac vein. Proximal iliac vein not visualized.  07/01/18 Clot evacuation   RUE doppler  07/02/18 Antimicrobials: vancomycin and zosyn since 11/23 for sepsis and abdominal infection.   Subjective: -Swelling improving, continues to have stool incontinence  Objective: Vitals:  08/14/18 2015 08/14/18 2300 08/15/18 0420 08/15/18 0900  BP:   107/79 115/79  Pulse:   (!) 103 (!) 103  Resp:   11 18  Temp: 99.1 F (37.3 C) 98.2 F (36.8 C) 98.8 F (37.1 C) 98.8 F (37.1 C)  TempSrc: Oral Oral Oral Oral  SpO2:   100% 100%  Weight:      Height:        Intake/Output Summary (Last 24 hours) at 08/15/2018 1338 Last data filed at 08/15/2018 1305 Gross per 24 hour  Intake 966.09 ml  Output 1700 ml  Net -733.91 ml   Filed Weights   07/07/18 1000 07/18/18 0600 07/27/18 1502  Weight: 123.8 kg 135 kg 127.6 kg    Examination: Gen: Awake, Alert, Oriented X 3, chronically ill obese male, sitting in bed HEENT: PERRLA, Neck supple, no JVD Lungs: Decreased BS at bases  CVS: RRR,No Gallops,Rubs or new Murmurs Abd: soft, Non tender, non distended, BS present Extremities: 1+ edema, left arm swelling is improving Skin: no new rashes Neuro: awake alert, oriented x4, right upper extremity strength is 5 x 5, left upper extremity has diminished proximal muscle strength and 4+ at the wrist -Able to wiggle his toes on the right foot, no movements noted in the left leg  Data Reviewed: I have personally reviewed following labs and imaging studies  CBC: Recent Labs  Lab 08/11/18 0639 08/12/18 0320 08/13/18 0628 08/14/18 0426 08/15/18 0451  WBC 9.8 11.8* 10.7* 11.0* 10.6*  HGB 8.9* 7.6* 8.5* 9.3* 8.8*  HCT 29.1* 25.8* 27.4* 30.3* 29.4*  MCV 96.7 98.5 96.8 95.9 97.4  PLT 149* 183 137* 160 161   Basic Metabolic Panel: Recent Labs  Lab 08/11/18 0639 08/12/18 0320 08/14/18 0426 08/15/18 0451  NA 140 139 138 140  K 3.8 3.9 3.8 4.0  CL 111 106 101 102  CO2 _0 GLUCOSE 109* 103* 94 101*  BUN _1 CREATININE 0.58* 0.62 0.69 0.61  CALCIUM 8.7* 8.8* 8.9 9.1    GFR: Estimated Creatinine Clearance: 142.8 mL/min (by C-G formula based on SCr of 0.61 mg/dL). Liver Function Tests: No results for input(s): AST, ALT, ALKPHOS, BILITOT, PROT, ALBUMIN in the last 168 hours. No results for input(s): LIPASE, AMYLASE in the last 168 hours. No results for input(s): AMMONIA in the last 168 hours. Coagulation Profile: No results for input(s): INR, PROTIME in the last 168 hours. Cardiac Enzymes: No results for input(s): CKTOTAL, CKMB, CKMBINDEX, TROPONINI in the last 168 hours. BNP (last 3 results) No results for input(s): PROBNP in the last 8760 hours. HbA1C: No results for input(s): HGBA1C in the last 72 hours. CBG: Recent Labs  Lab 08/14/18 1627 08/14/18 2104 08/14/18 2205 08/15/18 0752 08/15/18 1153  GLUCAP 81 68* 80 87 100*   Lipid Profile: No results for input(s): CHOL, HDL, LDLCALC, TRIG, CHOLHDL, LDLDIRECT in the last 72 hours. Thyroid Function Tests: No results for input(s): TSH, T4TOTAL, FREET4, T3FREE, THYROIDAB in the last 72 hours. Anemia Panel: No results for input(s): VITAMINB12, FOLATE, FERRITIN, TIBC, IRON, RETICCTPCT in the last 72 hours. Sepsis Labs: No results for input(s): PROCALCITON, LATICACIDVEN in the last 168 hours.  Recent Results (from the past 240 hour(s))  Culture, blood (routine x 2)     Status: None (Preliminary result)   Collection Time: 08/11/18  8:44 AM  Result Value Ref Range Status   Specimen Description BLOOD RIGHT ANTECUBITAL  Final   Special Requests   Final  BOTTLES DRAWN AEROBIC AND ANAEROBIC Blood Culture adequate volume   Culture   Final    NO GROWTH 3 DAYS Performed at [facility], [place] [address], [place], [place] [address]    Report Status PENDING  Incomplete  Culture, blood (routine x 2)     Status: None (Preliminary result)   Collection Time: 08/11/18  8:44 AM  Result Value Ref Range Status   Specimen Description BLOOD RIGHT HAND  Final   Special Requests   Final    BOTTLES DRAWN  AEROBIC ONLY Blood Culture adequate volume   Culture   Final    NO GROWTH 3 DAYS Performed at [facility], [place] [address] [place], [place] [address]    Report Status PENDING  Incomplete         Radiology Studies: No results found.      Scheduled Meds: . sodium chloride   Intravenous Once  . bismuth subsalicylate  30 mL Oral TID AC & HS  . Chlorhexidine Gluconate Cloth  6 each Topical Q0600  . clonazePAM  0.5 mg Oral BID  . collagenase   Topical Daily  . famotidine  20 mg Oral Daily  . feeding supplement (ENSURE ENLIVE)  237 mL Oral BID BM  . furosemide  40 mg Intravenous Q12H  . gabapentin  300 mg Oral TID  . Gerhardt's butt cream   Topical BID  . Influenza vac split quadrivalent PF  0.5 mL Intramuscular Tomorrow-1000  . insulin aspart  0-15 Units Subcutaneous TID WC  . insulin aspart  0-5 Units Subcutaneous QHS  . insulin glargine  20 Units Subcutaneous QHS  . magic mouthwash w/lidocaine  5 mL Oral TID  . midodrine  5 mg Oral BID WC  . multivitamin with minerals  1 tablet Oral Daily  . potassium chloride  40 mEq Oral BID  . sodium chloride flush  10-40 mL Intracatheter Q12H   Continuous Infusions: . sodium chloride 10 mL/hr at 08/15/18 0200  . argatroban 1 mcg/kg/min (08/15/18 1226)     LOS: 69 days   Domenic Polite, MD [facility] Page via [email or web address]  If 7PM-7AM, please contact night-coverage [email or web address] Password [email or web address] 08/15/2018, 1:38 PM

## 2018-08-15 NOTE — Progress Notes (Signed)
ANTICOAGULATION CONSULT NOTE   Pharmacy Consult for Argatroban  Indication: pulmonary embolus and bilateral DVT   Patient Measurements: Height: 5\' 9"  (175.3 cm) Weight: 281 lb 4.9 oz (127.6 kg) IBW/kg (Calculated) : 70.7 Bivalirudin Dosing Weight: 127.6 kg  Vital Signs: Temp: 98.8 F (37.1 C) (12/03 0900) Temp Source: Oral (12/03 0900) BP: 115/79 (12/03 0900) Pulse Rate: 103 (12/03 0900)  Labs: Recent Labs    08/13/18 0628 08/14/18 0426 08/15/18 0451  HGB 8.5* 9.3* 8.8*  HCT 27.4* 30.3* 29.4*  PLT 137* 160 172  APTT 76* 72* 82*  CREATININE  --  0.69 0.61    Assessment: 52 yr old male on Eliquis PTA for hx of PE and DVTs. Has IVC filter. Has had multiple events of bleeding over the last couple months. HIT is negative but started on Bivalirudin on 11/25. Found to have new thrombus on 11/29 and heme/onc recommended to transition to argatroban for anticoagulation  aPTT remains therapeutic 82 sec.   Hgb 8.8. Platelet count 172 today.  Goal of Therapy:  aPTT 50-90 seconds Monitor platelets by anticoagulation protocol: Yes   Plan:  1. Continue argatroban IV 621mcg/kg/min (7.6 ml/hr) 2. Monitor daily aPTT, CBC, s/s of bleed 3. Await long term plan on anticoagulation    Jeanella Caraathy Coy Vandoren, PharmD, [facility] Clinical Pharmacist Please see AMION for all Pharmacists' Contact Phone Numbers 08/15/2018, 12:08 PM

## 2018-08-15 NOTE — Progress Notes (Signed)
Overall, there really has not been much change in the status of [name].  His homocystine level is normal.  As such, I do not think we have to worry about putting him on high-dose folic acid.  His left arm does not look as swollen.  He is on the argatroban now.  I think his platelet count is stabilizing pretty well.  His platelet count is 172,000 today.  I think that some of the swelling that he has a probably from his anemia.  His hemoglobin is 8.8.  His erythropoietin level is only 21.  As such, I suspect he probably would respond to ESA therapy.  However, one of the  risks of ESA is thromboembolic disease.  As such, I probably would want to avoid using Aranesp or Procrit.  He has not yet had the CT scan for his left knee.  I really think that he would benefit from massage therapy and stretching of his legs.  He says that physical therapy will not do this because of the risk of his blood clots breaking off and going to his lungs.  I said that this is not possible since he has a filter in to prevent that from happening.  Hopefully, we will get physical therapy to comment to help with his legs.  I suspect he probably has some muscle contractures from not being able to move and just being in bed.  I really think that if he can get some massage therapy for his legs, this would make him feel a whole lot better physically and also mentally.  I will keep him on the argatroban for right now.  I  will switch him over to an oral therapy later on this week.  If his red cell count drops further, then I may have to consider another transfusion for him.  I will go ahead and give him a dose of IV iron.  [name], MD  [name]

## 2018-08-16 ENCOUNTER — Inpatient Hospital Stay (HOSPITAL_COMMUNITY)

## 2018-08-16 LAB — CBC
HCT: 25.1 % — ABNORMAL LOW (ref 39.0–52.0)
Hemoglobin: 7.5 g/dL — ABNORMAL LOW (ref 13.0–17.0)
MCH: 29.5 pg (ref 26.0–34.0)
MCHC: 29.9 g/dL — ABNORMAL LOW (ref 30.0–36.0)
MCV: 98.8 fL (ref 80.0–100.0)
Platelets: 176 10*3/uL (ref 150–400)
RBC: 2.54 MIL/uL — ABNORMAL LOW (ref 4.22–5.81)
RDW: 20.2 % — AB (ref 11.5–15.5)
WBC: 18.3 10*3/uL — ABNORMAL HIGH (ref 4.0–10.5)
nRBC: 0.8 % — ABNORMAL HIGH (ref 0.0–0.2)

## 2018-08-16 LAB — APTT: aPTT: 41 seconds — ABNORMAL HIGH (ref 24–36)

## 2018-08-16 LAB — GLUCOSE, CAPILLARY
GLUCOSE-CAPILLARY: 91 mg/dL (ref 70–99)
Glucose-Capillary: 99 mg/dL (ref 70–99)
Glucose-Capillary: 132 mg/dL — ABNORMAL HIGH (ref 70–99)
Glucose-Capillary: 104 mg/dL — ABNORMAL HIGH (ref 70–99)

## 2018-08-16 LAB — CULTURE, BLOOD (ROUTINE X 2)
Culture: NO GROWTH
Culture: NO GROWTH
Special Requests: ADEQUATE
Special Requests: ADEQUATE

## 2018-08-16 LAB — BASIC METABOLIC PANEL
Anion gap: 9 (ref 5–15)
BUN: 13 mg/dL (ref 6–20)
CALCIUM: 9.2 mg/dL (ref 8.9–10.3)
CO2: 30 mmol/L (ref 22–32)
Chloride: 102 mmol/L (ref 98–111)
Creatinine, Ser: 0.66 mg/dL (ref 0.61–1.24)
GFR calc Af Amer: >60 mL/min (ref >60)
GFR calc non Af Amer: >60 mL/min (ref >60)
Glucose, Bld: 119 mg/dL — ABNORMAL HIGH (ref 70–99)
Potassium: 4.3 mmol/L (ref 3.5–5.1)
Sodium: 141 mmol/L (ref 135–145)

## 2018-08-16 LAB — PREPARE RBC (CROSSMATCH)

## 2018-08-16 MED ORDER — FUROSEMIDE 10 MG/ML IJ SOLN
40.0000 mg | Freq: Once | INTRAMUSCULAR | Status: AC
  Administered 2018-08-16: 40 mg via INTRAVENOUS
  Filled 2018-08-16: qty 4

## 2018-08-16 MED ORDER — SODIUM CHLORIDE 0.9% IV SOLUTION: Freq: Once | INTRAVENOUS | Status: DC

## 2018-08-16 MED ORDER — APIXABAN 5 MG PO TABS
5.0000 mg | ORAL_TABLET | Freq: Two times a day (BID) | ORAL | Status: DC
  Administered 2018-08-16 – 2018-09-10 (×52): 5 mg via ORAL
  Filled 2018-08-16 (×9): qty 1
  Filled 2018-08-16: qty 2
  Filled 2018-08-16 (×42): qty 1

## 2018-08-16 MED ORDER — MORPHINE SULFATE (PF) 2 MG/ML IV SOLN
1.0000 mg | INTRAVENOUS | Status: DC | PRN
  Administered 2018-08-17 – 2018-08-29 (×15): 2 mg via INTRAVENOUS
  Filled 2018-08-16 (×16): qty 1

## 2018-08-16 MED ORDER — FUROSEMIDE 10 MG/ML IJ SOLN
40.0000 mg | Freq: Once | INTRAMUSCULAR | Status: DC
  Filled 2018-08-16: qty 4

## 2018-08-16 NOTE — Plan of Care (Signed)

## 2018-08-16 NOTE — Social Work (Addendum)
12:17pm- Return call from Schering-PloughKim Hall. We discussed that pt is not medically ready for discharge today. Pt still has SNF benefit under his plan which is carried by his wife. [place] the [place], and [place] by [employer] insurance. Pt would need to be faxed into [facility] by facility of choice. Pt has previously been to [facility].    12:13pm- CSW again attempted to reach [facility] case manager Ocie CornfieldKim Hall, [phone] ext [phone] regarding care planning for pt. Await follow up- still no response from 12/2 call.  Octavio GravesIsabel Cyndie Woodbeck, MSW, [facility] [place] Clinical Social Work [phone]

## 2018-08-16 NOTE — Progress Notes (Signed)
[name] is really about the same.  There is still the swelling in the left arm and legs.  I think some of this is from him being anemic.  His hemoglobin is down at 7.5.  I think that he probably needs to be checked for bleeding.  I will check his stools for blood.  The nurses say that his stools are not as liquidy.  He is having a lot of pain in the left knee.  I had ordered a CT scan of the left knee but somehow this was canceled.  He has had no fever.  His electrolytes show a BUN of 13 creatinine 0.66.  Potassium 4.3.  His white cell count is up to 18.3.  This is up quite a bit.  I am not sure if this is a sign of an infection.  He does have this free air in the abdomen.  I think this may have to be looked into again.  I do not know if he needs to have an upper endoscopy done.  His temperature max was 100.5.  His heart rate is up a little bit.  His heart rate is about 112.  Again I suspect this probably is from him being anemic.  On his examination, he has no thrush.  There is no mucositis.  His cardiac exam is tachycardic but regular.  His lungs sound relatively clear bilaterally.  I do not hear any wheezes or rubs.  I do not hear any rhonchi or rales.  Abdomen is soft.  Bowel sounds are present.  Bowel sounds might be decreased a little bit.  Extremities does show the swelling with his left knee.  There is no erythema.  He has some nonpitting edema of the left arm.  Neurological exam is nonfocal.  I know he has the skin breakdown on the rear end.  It is conceivable that this might be where the temperatures are coming from and where his white cell elevation is coming from.  I would think this needs to be checked and cultured.  I know that there is so much going on for [name].  He still is on the argatroban infusion.  We will switch him over to oral at this point.  I would not imagine that he would need any kind of surgery.  As always, I prayed for him.   Christin BachPete Ennever, MD  [place]  1:30-31

## 2018-08-16 NOTE — Progress Notes (Signed)
PROGRESS NOTE    [name] [name]  TUU:[id number] DOB: July 11, 1966 DOA: 06/06/2018 PCP: Helane Rima, MD    Brief Narrative:  52/M with PMH of gout, hypertension, underwent anterior cervical corpectomy of C4 on 7/91/5056 with complicated postoperative course including development of multiple PEs and DVTs started on anticoagulation and subsequently developed C spinal hematoma requiring reexploration on 03/28/2018 status post placement of IVC filter then discharged to SNF -Patient was admitted to this hospital as a transfer from [facility] with MRI of C-spine that showed postoperative changes and cord compression with cord edema and concern for ischemia, s/p post posterior cervical decompression laminectomy of C3, C4, C5, removal of spinous processes, foraminotomies on 06/14/2018.  -On 06/22/2018 developed worsening left sided paresthesias and weakness MRI noted postoperative hematoma, was taken to the OR for evacuation of hematoma.  -Additionally developed Bilateral lower extremity DVTs -on 10/19 was noted to have decreased left hand grip on 10/19, MRI showed large epidural hematoma, patient s/p clot evacuation. He was placed back on anticoagulation argatroban on 10-22. -HIT panel0.486 (mildly elevated, Serotonin release assay was negative. Dr Marin Olp following  -On 10/25; developed weakness of left arm, He was take emergently to OR for recurrence of epidural hematoma on 10-25.He was taken off of anticoagulation.  -10/28 Night: developed left arm weakness, numbness. Stat MRI was ordered, which confirmed recurrent hematoma. Patient was taken to OR for hematoma evacuation 10-28.  -For his thrombocytopenia, Heme Dr.Ennever following, Bone marrow biopsy was done. For LE DVT, he has IVC filter. Patient has received multiple platelet transfusionsand Nplate. Patient underwent CT scan of his chest on 11/13 which showed new pulmonary embolism. Patient was started on bivalirudinafter  discussions between hematology as well as neurosurgery. Platelet counts have improved.  On 11/22 pt was found to have free intraabdominal air, it was followed with CT abd pelvis , shows possible stranding at the greater curvature of the stomach.  Surgery consulted, upper GI series which did not show obstruction, improved, now tolerating diet 11/28 PM noted to have DVT in LUEs on Bivalrudin 11/29 Fever-blood cx drawn, bivalirudin switched over to argatroban 11/30 CCM consulted, s/p R IJ TLC, PICC removed, Blood Cx negative   Assessment & Plan:   Principal Problem:   Recurrent pulmonary embolism ([place]) Active Problems:   Myelopathy ([place])   Pressure injury of skin   Thrombocytopenia ([facility])   Acute blood loss anemia   Benign essential HTN   Cord compression ([facility])   Fever   Spondylosis, cervical, with myelopathy   Labile blood pressure   Orthostatic hypotension   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 Followed with multiple clot evacuation procedures on 10/19, 10/25, 10/28. Repeat MRI on 07/23/2018 shows improvement of the fluid collection. Neurosurgery continues to follow he appears to be stable from their standpoint at this time. He needs aggressive rehabilitation plan for SNF when stable.    Acute pulmonary embolism/DVT in the setting of spine surgery S/p IVC filter placement on March 28, 2018 and in addition to that patient has been on multiple anticoagulations which led to recurrent hematomas at the surgical sites requiring further evacuations.  CT chest on November  13 showed new pulmonary embolism with evidence of right heart strain, new acute DVT in the left subclavian axillary, brachial, radial veins and superficial thrombus in the left cephalic and basilic veins on November 28 and he was restarted on anticoagulation.  Currently he is on Eliquis.  Continue to monitor blood counts. Dr. Marin Olp on board   Pneumoperitoneum incidental on 11/24 General surgery consulted and repeat CT abdomen and pelvis on 1124 showed soft tissue stranding adjacent to the greater curvature of the body of the stomach.  It was followed up with an upper GI series on 11/25 which was negative for leak or perforation. Completed a course of antibiotics and continue with PPI for stress ulcer prophylaxis   Thrombocytopenia/coagulopathy Patient had acute thrombocytopenia earlier in the admission he had extensive work-up for acute thrombocytopenia and and his work-up has been essentially negative.  Over the course of his hospitalization he had received multiple platelet transfusions and his platelet count is currently within normal limits.  Dr. Marin Olp with hematology following.    Fluid overload with upper and lower extremity edema with third spacing Restart IV Lasix 40 mg twice daily and continue to monitor intake and output. Recommend diuretics as long as blood pressure and kidney function tolerates.    Hyperglycemia probably secondary to the use of steroids earlier in the admission Last hemoglobin A1c was 5.8 Continue with Lantus and sliding scale insulin.   Anemia due to acute illness superimposed on anemia of chronic illness Transfuse to keep hemoglobin greater than 8.   Sinus bradycardia TSH within normal limits. It resolved   Hypertension Blood pressure well controlled at this time.   Adjustment disorder with depression and anxiety Plan continue Klonopin as needed.   Stage II pressure ulcer on the right ischial tuberosity not present on admission Wound care  consulted and recommendations given.   Left knee pain with swelling and warmth X-rays ordered and on IV morphine as needed  DVT prophylaxis: Eliquis Code Status: Full code Family Communication: None at bedside, discussed the plan with the patient. Disposition Plan: Pending clinical improvement   Consultants:  Dr. Marin Olp with oncology Cardiology Surgery Dr. Donne Hazel Neurosurgery Dr. Saintclair Halsted  Procedures:On 10/2 1. posterior cervical decompressive laminectomy at C3, C4, C5 with removal of the spinous process and complete laminectomy and foraminotomies of the C3, C4, C5 nerve roots. 2. Posterior cervical fusion with lateral mass screws at C3-C4-C5 and pars screws at C2 lies in the globus ellipseLateral mass screw system 3. Posterior lateral arthrodesis C2-C5 utilizing locally harvested autograft mixed with vivigen  06/22/2018: Procedure reexploration posterior cervical wound for evacuation of epidural hematoma  06/26/2018 DVT ultrasound:Right: Findings consistent with acute deep vein thrombosis involving the right common femoral vein, right femoral vein, right popliteal vein, right posterior tibial vein, and right peroneal vein. Thrombosis extends proximally into external iliac vein.  Proximal iliac vein not visualized. Left: Findings consistent with acute deep vein thrombosis involving the left common femoral vein, left femoral vein, left popliteal vein, left posterior tibial vein, and left peroneal vein. Findings consistent with acute superficial vein thrombosis  involving the left great saphenous vein. Thrombosis extends proximally into external iliac vein. Proximal iliac vein not visualized.  07/01/18 Clot evacuation   RUE doppler 07/02/18  Antimicrobials: None   Subjective: Reports severe pain in the left knee, not improving with oxycodone requesting IV morphine. Objective: Vitals:   08/15/18 2128 08/16/18 0001 08/16/18 0400 08/16/18 0741  BP: 120/73 110/66 128/80  116/73  Pulse: (!) 127 (!) 110 (!) 113 (!) 113  Resp: (!) _0 (!) 27  Temp: (!) 100.5 F (38.1 C) 100.1 F (37.8 C) 98.4 F (36.9 C) 98.5 F (36.9 C)  TempSrc:  Oral Oral Oral  SpO2: 90% 93% 92% 93%  Weight:      Height:        Intake/Output Summary (Last 24 hours) at 08/16/2018 1032 Last data filed at 08/16/2018 0744 Gross per 24 hour  Intake 240 ml  Output 1550 ml  Net -1310 ml   Filed Weights   07/07/18 1000 07/18/18 0600 07/27/18 1502  Weight: 123.8 kg 135 kg 127.6 kg    Examination:  General exam: Mild discomfort from left knee pain Respiratory system: Clear to auscultation. Respiratory effort normal. Cardiovascular system: S1 & S2 heard, RRR. No JVD, murmurs, Gastrointestinal system: Abdomen is nondistended, soft and nontender. No organomegaly or masses felt. Normal bowel sounds heard. Central nervous system: Alert and oriented.  Extremities: Bilateral leg edema 2+ Skin: Stage II decubitus ulcer on the right ischium tuberosity Psychiatry:  Mood & affect appropriate.     Data Reviewed: I have personally reviewed following labs and imaging studies  CBC: Recent Labs  Lab 08/12/18 0320 08/13/18 0628 08/14/18 0426 08/15/18 0451 08/16/18 0506  WBC 11.8* 10.7* 11.0* 10.6* 18.3*  HGB 7.6* 8.5* 9.3* 8.8* 7.5*  HCT 25.8* 27.4* 30.3* 29.4* 25.1*  MCV 98.5 96.8 95.9 97.4 98.8  PLT 183 137* 160 172 696   Basic Metabolic Panel: Recent Labs  Lab 08/11/18 0639 08/12/18 0320 08/14/18 0426 08/15/18 0451 08/16/18 0506  NA 140 139 138 140 141  K 3.8 3.9 3.8 4.0 4.3  CL 111 106 101 102 102  CO2 _1 GLUCOSE 109* 103* 94 101* 119*  BUN _2 CREATININE 0.58* 0.62 0.69 0.61 0.66  CALCIUM 8.7* 8.8* 8.9 9.1 9.2   GFR: Estimated Creatinine Clearance: 142.8 mL/min (by C-G formula based on SCr of 0.66 mg/dL). Liver Function Tests: No results for input(s): AST, ALT, ALKPHOS, BILITOT, PROT, ALBUMIN in the last 168 hours. No results for  input(s): LIPASE, AMYLASE in the last 168 hours. No results for input(s): AMMONIA in the last 168 hours. Coagulation Profile: No results for input(s): INR, PROTIME in the last 168 hours. Cardiac Enzymes: No results for input(s): CKTOTAL, CKMB, CKMBINDEX, TROPONINI in the last 168 hours. BNP (last 3 results) No results for input(s): PROBNP in the last 8760 hours. HbA1C: No results for input(s): HGBA1C in the last 72 hours. CBG: Recent Labs  Lab 08/15/18 0752 08/15/18 1153 08/15/18 1635 08/15/18 2124 08/16/18 0745  GLUCAP 87 100* 93 141* 99   Lipid Profile: No results for input(s): CHOL, HDL, LDLCALC, TRIG, CHOLHDL, LDLDIRECT in the last 72 hours. Thyroid Function Tests: No results for input(s): TSH, T4TOTAL, FREET4, T3FREE, THYROIDAB in the last 72 hours. Anemia Panel: No results for input(s): VITAMINB12, FOLATE, FERRITIN, TIBC, IRON, RETICCTPCT in the last 72 hours. Sepsis Labs: No results for input(s): PROCALCITON, LATICACIDVEN in the last 168 hours.  Recent Results (from the past 240 hour(s))  Culture, blood (routine x 2)     Status: None   Collection Time: 08/11/18  8:44 AM  Result Value Ref Range Status   Specimen Description BLOOD RIGHT ANTECUBITAL  Final   Special Requests   Final    BOTTLES DRAWN AEROBIC AND ANAEROBIC Blood Culture adequate volume   Culture   Final    NO  GROWTH 5 DAYS Performed at [facility], [place] [address], [place], [place] [address]    Report Status 08/16/2018 FINAL  Final  Culture, blood (routine x 2)     Status: None   Collection Time: 08/11/18  8:44 AM  Result Value Ref Range Status   Specimen Description BLOOD RIGHT HAND  Final   Special Requests   Final    BOTTLES DRAWN AEROBIC ONLY Blood Culture adequate volume   Culture   Final    NO GROWTH 5 DAYS Performed at [facility], [place] [address], [place], [place] [address]    Report Status 08/16/2018 FINAL  Final         Radiology Studies: No results  found.      Scheduled Meds: . sodium chloride   Intravenous Once  . sodium chloride   Intravenous Once  . apixaban  5 mg Oral BID  . bismuth subsalicylate  30 mL Oral TID AC & HS  . Chlorhexidine Gluconate Cloth  6 each Topical Q0600  . clonazePAM  0.5 mg Oral BID  . collagenase   Topical Daily  . famotidine  20 mg Oral Daily  . feeding supplement (ENSURE ENLIVE)  237 mL Oral TID BM  . furosemide  40 mg Intravenous Q12H  . furosemide  40 mg Intravenous Once  . furosemide  40 mg Intravenous Once  . gabapentin  300 mg Oral TID  . insulin aspart  0-15 Units Subcutaneous TID WC  . insulin aspart  0-5 Units Subcutaneous QHS  . insulin glargine  20 Units Subcutaneous QHS  . magic mouthwash w/lidocaine  5 mL Oral TID  . multivitamin with minerals  1 tablet Oral Daily  . potassium chloride  40 mEq Oral BID  . sodium chloride flush  10-40 mL Intracatheter Q12H   Continuous Infusions: . sodium chloride 10 mL/hr at 08/15/18 0200     LOS: 70 days    Time spent: 45 minutes    Hosie Poisson, MD [facility] Pager [phone] If 7PM-7AM, please contact night-coverage [email or web address] Password [email or web address] 08/16/2018, 10:32 AM

## 2018-08-17 ENCOUNTER — Inpatient Hospital Stay (HOSPITAL_COMMUNITY)

## 2018-08-17 LAB — CBC
HCT: 34.3 % — ABNORMAL LOW (ref 39.0–52.0)
Hemoglobin: 10.5 g/dL — ABNORMAL LOW (ref 13.0–17.0)
MCH: 29.7 pg (ref 26.0–34.0)
MCHC: 30.6 g/dL (ref 30.0–36.0)
MCV: 96.9 fL (ref 80.0–100.0)
PLATELETS: 156 10*3/uL (ref 150–400)
RBC: 3.54 MIL/uL — ABNORMAL LOW (ref 4.22–5.81)
RDW: 19.3 % — ABNORMAL HIGH (ref 11.5–15.5)
WBC: 11.1 10*3/uL — ABNORMAL HIGH (ref 4.0–10.5)
nRBC: 0.8 % — ABNORMAL HIGH (ref 0.0–0.2)

## 2018-08-17 LAB — TYPE AND SCREEN
ABO/RH(D): O POS
Antibody Screen: NEGATIVE
Unit division: 0
Unit division: 0

## 2018-08-17 LAB — GLUCOSE, CAPILLARY
Glucose-Capillary: 103 mg/dL — ABNORMAL HIGH (ref 70–99)
Glucose-Capillary: 133 mg/dL — ABNORMAL HIGH (ref 70–99)
Glucose-Capillary: 79 mg/dL (ref 70–99)
Glucose-Capillary: 127 mg/dL — ABNORMAL HIGH (ref 70–99)

## 2018-08-17 LAB — BPAM RBC
Blood Product Expiration Date: 201912302359
Blood Product Expiration Date: 201912302359
ISSUE DATE / TIME: 201912041146
ISSUE DATE / TIME: 201912041502
Unit Type and Rh: 5100
Unit Type and Rh: 5100

## 2018-08-17 LAB — BASIC METABOLIC PANEL
ANION GAP: 12 (ref 5–15)
BUN: 15 mg/dL (ref 6–20)
CO2: 28 mmol/L (ref 22–32)
Calcium: 9 mg/dL (ref 8.9–10.3)
Chloride: 100 mmol/L (ref 98–111)
Creatinine, Ser: 0.68 mg/dL (ref 0.61–1.24)
GFR calc Af Amer: >60 mL/min (ref >60)
GFR calc non Af Amer: >60 mL/min (ref >60)
Glucose, Bld: 152 mg/dL — ABNORMAL HIGH (ref 70–99)
Potassium: 3.9 mmol/L (ref 3.5–5.1)
Sodium: 140 mmol/L (ref 135–145)

## 2018-08-17 MED ORDER — OXYCODONE HCL 5 MG PO TABS
20.0000 mg | ORAL_TABLET | ORAL | Status: DC | PRN
  Administered 2018-08-17 – 2018-09-11 (×65): 20 mg via ORAL
  Filled 2018-08-17 (×69): qty 4

## 2018-08-17 MED ORDER — COLCHICINE 0.6 MG PO TABS
0.6000 mg | ORAL_TABLET | Freq: Two times a day (BID) | ORAL | Status: DC
  Administered 2018-08-17 – 2018-08-20 (×7): 0.6 mg via ORAL
  Filled 2018-08-17 (×7): qty 1

## 2018-08-17 NOTE — Progress Notes (Signed)
Unfortunately, [name] [name] is quite upset this morning.  There was some concern amongst the nursing staff that the IV morphine that he was being given for his knee pain was caused him to have some lethargy and confusion.  As such, I had stopped this yesterday.  He is on oxycodone.  He says the oxycodone really does not help.  He says some morphine works much more quickly.  Apparently, the morphine was re-started yesterday.  I am not sure why he never got a dose of the morphine.  He still has the knee pain.  His left knee is swollen.  I had ordered a CT scan for the knee but somehow this was canceled.  I am not sure as to why it was canceled.  I really think that we have to figure out what is going on with that left knee.  He does appear to be swollen.  I do not know if orthopedic surgery needs to see him.  I know that [name] [name] would feel better if orthopedics would evaluate him.  I think it would be a good idea to have orthopedics see him.  Again, it would give [name] [name] some peace of mind.  I had also written for physical therapy to try to do some massaging of his legs.  Again, he will not have problems with the blood clots as he is on anticoagulation and he has a filter in the IVC.  I know that some massage therapy for his legs would also benefit him, may be more so from a emotional point of view.  There is no cough or shortness of breath.  He has had no bleeding.  He did get 2 units of blood yesterday.  He has not had any blood work done yet today.  There is still some swelling in the left arm.  He is eating regular food he says.  He says he is not having diarrhea.  I am not sure what the nurses have seen with this.  He is off antibiotics at this point time.  He has been afebrile.  He may have had a low-grade temperature.  On his exam, his vital signs show temperature of 100 degrees his pulse is 112 blood pressure 121/86.  Oxygen saturation is 97%.  His oral exam shows no thrush.  His neck is  with no adenopathy.  His lungs sound pretty clear bilaterally.  Cardiac exam regular rate and rhythm with no murmurs, rubs or bruits.  Abdomen is obese but soft.  Bowel sounds are present.  There is no fluid wave.  Extremities does show the swelling with the left knee.  He does have some tenderness with palpation about the left knee.  There is no obvious warmth associated with the left knee.  He has some swelling of the left arm.  Neurological exam shows the weakness with his legs and left arm.  So far, he is doing okay with the Eliquis.  I think it is too early to know how the thromboembolic disease is responding.  At least he is not bleeding.  At least his platelets are holding steady.  I wonder if palliative care might not be a bad idea for [name] [name] to try to help with the pain issues and other problems that he has given the fact that he now has some element of paralysis.  I know that this is a very difficult case.  I just wish that we could find an actual etiology for the blood  clotting and the bleeding.  Thankfully, the thrombocytopenia has resolved.  He did have a bone marrow biopsy done which was unrevealing for any obvious hematologic issue.  I continue to pray hard for him.  Hopefully, it would be nice to try to get him to some kind of rehab or skilled nursing so he could try to improve his functional state.  [name] Haw, MD  Psalm 147:3

## 2018-08-17 NOTE — Progress Notes (Signed)
PROGRESS NOTE    [name] [name]  TUU:[id number] DOB: July 11, 1966 DOA: 06/06/2018 PCP: Helane Rima, MD    Brief Narrative:  52/M with PMH of gout, hypertension, underwent anterior cervical corpectomy of C4 on 7/91/5056 with complicated postoperative course including development of multiple PEs and DVTs started on anticoagulation and subsequently developed C spinal hematoma requiring reexploration on 03/28/2018 status post placement of IVC filter then discharged to SNF -Patient was admitted to this hospital as a transfer from [facility] with MRI of C-spine that showed postoperative changes and cord compression with cord edema and concern for ischemia, s/p post posterior cervical decompression laminectomy of C3, C4, C5, removal of spinous processes, foraminotomies on 06/14/2018.  -On 06/22/2018 developed worsening left sided paresthesias and weakness MRI noted postoperative hematoma, was taken to the OR for evacuation of hematoma.  -Additionally developed Bilateral lower extremity DVTs -on 10/19 was noted to have decreased left hand grip on 10/19, MRI showed large epidural hematoma, patient s/p clot evacuation. He was placed back on anticoagulation argatroban on 10-22. -HIT panel0.486 (mildly elevated, Serotonin release assay was negative. Dr Marin Olp following  -On 10/25; developed weakness of left arm, He was take emergently to OR for recurrence of epidural hematoma on 10-25.He was taken off of anticoagulation.  -10/28 Night: developed left arm weakness, numbness. Stat MRI was ordered, which confirmed recurrent hematoma. Patient was taken to OR for hematoma evacuation 10-28.  -For his thrombocytopenia, Heme Dr.Ennever following, Bone marrow biopsy was done. For LE DVT, he has IVC filter. Patient has received multiple platelet transfusionsand Nplate. Patient underwent CT scan of his chest on 11/13 which showed new pulmonary embolism. Patient was started on bivalirudinafter  discussions between hematology as well as neurosurgery. Platelet counts have improved.  On 11/22 pt was found to have free intraabdominal air, it was followed with CT abd pelvis , shows possible stranding at the greater curvature of the stomach.  Surgery consulted, upper GI series which did not show obstruction, improved, now tolerating diet 11/28 PM noted to have DVT in LUEs on Bivalrudin 11/29 Fever-blood cx drawn, bivalirudin switched over to argatroban 11/30 CCM consulted, s/p R IJ TLC, PICC removed, Blood Cx negative   Assessment & Plan:   Principal Problem:   Recurrent pulmonary embolism ([place]) Active Problems:   Myelopathy ([place])   Pressure injury of skin   Thrombocytopenia ([facility])   Acute blood loss anemia   Benign essential HTN   Cord compression ([facility])   Fever   Spondylosis, cervical, with myelopathy   Labile blood pressure   Orthostatic hypotension   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 Followed with multiple clot evacuation procedures on 10/19, 10/25, 10/28. Repeat MRI on 07/23/2018 shows improvement of the fluid collection. Neurosurgery continues to follow he appears to be stable from their standpoint at this time. He needs aggressive rehabilitation plan for SNF when stable.    Acute pulmonary embolism/DVT in the setting of spine surgery S/p IVC filter placement on March 28, 2018 and in addition to that patient has been on multiple anticoagulations which led to recurrent hematomas at the surgical sites requiring further evacuations.  CT chest on November  13 showed new pulmonary embolism with evidence of right heart strain, new acute DVT in the left subclavian axillary, brachial, radial veins and superficial thrombus in the left cephalic and basilic veins on November 28 and he was restarted on anticoagulation.  Currently he is on Eliquis.  Continue to monitor blood counts. Dr. Marin Olp on board   Pneumoperitoneum incidental on 11/24 General surgery consulted and repeat CT abdomen and pelvis on 1124 showed soft tissue stranding adjacent to the greater curvature of the body of the stomach.  It was followed up with an upper GI series on 11/25 which was negative for leak or perforation. Completed a course of antibiotics and continue with PPI for stress ulcer prophylaxis   Thrombocytopenia/coagulopathy Patient had acute thrombocytopenia earlier in the admission he had extensive work-up for acute thrombocytopenia and and his work-up has been essentially negative.  Over the course of his hospitalization he had received multiple platelet transfusions and his platelet count is currently within normal limits.  Dr. Marin Olp with hematology following.    Fluid overload with upper and lower extremity edema with third spacing Currently on IV Lasix 40 mg twice daily and continue to monitor intake and output.  He continues to be fluid overloaded will increase IV Lasix from 40 mg to 60 mg twice daily and monitor his urine output. Recommend diuretics as long as blood pressure and kidney function tolerates.    Hyperglycemia probably secondary to the use of steroids earlier in the admission Last hemoglobin A1c was 5.8 Continue with Lantus and sliding scale insulin. CBG (last 3)  Recent Labs    08/16/18 2124 08/17/18 0840 08/17/18 1153  GLUCAP 104* 103* 133*      Anemia due to acute illness superimposed on anemia of chronic illness Transfuse to keep hemoglobin greater than 8.  Repeat hemoglobin today is 10.5   Sinus bradycardia TSH within normal  limits. It resolved   Hypertension Blood pressure well controlled at this time.   Adjustment disorder with depression and anxiety Plan continue Klonopin as needed.   Stage II pressure ulcer on the right ischial tuberosity not present on admission Wound care consulted and recommendations given.   Left knee pain with swelling and warmth X-rays ordered and on IV morphine as needed, x-rays of the left knee does not show any effusion or acute abnormalities at this time but since patient reports persistent pain even with IV morphine, will get CT of the knee without contrast. Add colchicine to the his regimen suspect gout flareup.  Increase his oxycodone to 20 mg.  And use IV morphine for breakthrough pain.      DVT prophylaxis: Eliquis Code Status: Full code Family Communication: None at bedside, discussed the plan with the patient. Disposition Plan: Pending clinical improvement   Consultants:  Dr. Marin Olp with oncology Cardiology Surgery Dr. Donne Hazel Neurosurgery Dr. Saintclair Halsted  Procedures:On 10/2 1. posterior cervical decompressive laminectomy at C3, C4, C5 with removal of the spinous process and complete laminectomy and foraminotomies of the C3, C4, C5 nerve roots. 2. Posterior cervical fusion with lateral mass screws at C3-C4-C5 and pars screws at C2 lies in the globus ellipseLateral mass screw system 3. Posterior lateral arthrodesis C2-C5 utilizing locally harvested autograft mixed with vivigen  06/22/2018: Procedure reexploration posterior cervical wound for evacuation of epidural hematoma  06/26/2018 DVT ultrasound:Right: Findings consistent with acute deep vein thrombosis involving the right common femoral vein, right femoral vein, right popliteal vein, right posterior tibial vein, and right peroneal  vein. Thrombosis extends proximally into external iliac vein.  Proximal iliac vein not visualized. Left: Findings consistent with acute deep vein thrombosis involving the  left common femoral vein, left femoral vein, left popliteal vein, left posterior tibial vein, and left peroneal vein. Findings consistent with acute superficial vein thrombosis  involving the left great saphenous vein. Thrombosis extends proximally into external iliac vein. Proximal iliac vein not visualized.  07/01/18 Clot evacuation   RUE doppler 07/02/18  Antimicrobials: None   Subjective: Patient reports persistent severe pain in the left knee even with IV morphine.  Objective: Vitals:   08/17/18 0000 08/17/18 0400 08/17/18 0800 08/17/18 1151  BP:   130/78 (!) 147/87  Pulse:   (!) 103 (!) 115  Resp:   15 (!) 23  Temp: 99.7 F (37.6 C) 100 F (37.8 C) 98.8 F (37.1 C) 98 F (36.7 C)  TempSrc: Oral Oral Oral Oral  SpO2:   97% 99%  Weight:      Height:        Intake/Output Summary (Last 24 hours) at 08/17/2018 1501 Last data filed at 08/17/2018 0855 Gross per 24 hour  Intake 720 ml  Output 1150 ml  Net -430 ml   Filed Weights   07/07/18 1000 07/18/18 0600 07/27/18 1502  Weight: 123.8 kg 135 kg 127.6 kg    Examination:  General exam: Not in distress at this time.  Alert and comfortable Respiratory system: Clear to auscultation. Respiratory effort normal.  No wheezing or rhonchi Cardiovascular system: S1 & S2 heard, RRR. No JVD, murmurs, Gastrointestinal system: Soft, nontender, nondistended with good bowel sounds Central nervous system: Alert and oriented.  Continues to have left-sided weakness. Extremities: Bilateral leg edema 2+ Skin: Stage II decubitus ulcer on the right ischium tuberosity Psychiatry:  Mood & affect appropriate.     Data Reviewed: I have personally reviewed following labs and imaging studies  CBC: Recent Labs  Lab 08/13/18 0628 08/14/18 0426 08/15/18 0451 08/16/18 0506 08/17/18 1154  WBC 10.7* 11.0* 10.6* 18.3* 11.1*  HGB 8.5* 9.3* 8.8* 7.5* 10.5*  HCT 27.4* 30.3* 29.4* 25.1* 34.3*  MCV 96.8 95.9 97.4 98.8 96.9  PLT 137* 160 172  176 102   Basic Metabolic Panel: Recent Labs  Lab 08/12/18 0320 08/14/18 0426 08/15/18 0451 08/16/18 0506 08/17/18 0630  NA 139 138 140 141 140  K 3.9 3.8 4.0 4.3 3.9  CL 106 101 102 102 100  CO2 _0 GLUCOSE 103* 94 101* 119* 152*  BUN _1 CREATININE 0.62 0.69 0.61 0.66 0.68  CALCIUM 8.8* 8.9 9.1 9.2 9.0   GFR: Estimated Creatinine Clearance: 142.8 mL/min (by C-G formula based on SCr of 0.68 mg/dL). Liver Function Tests: No results for input(s): AST, ALT, ALKPHOS, BILITOT, PROT, ALBUMIN in the last 168 hours. No results for input(s): LIPASE, AMYLASE in the last 168 hours. No results for input(s): AMMONIA in the last 168 hours. Coagulation Profile: No results for input(s): INR, PROTIME in the last 168 hours. Cardiac Enzymes: No results for input(s): CKTOTAL, CKMB, CKMBINDEX, TROPONINI in the last 168 hours. BNP (last 3 results) No results for input(s): PROBNP in the last 8760 hours. HbA1C: No results for input(s): HGBA1C in the last 72 hours. CBG: Recent Labs  Lab 08/16/18 1133 08/16/18 1704 08/16/18 2124 08/17/18 0840 08/17/18 1153  GLUCAP 132* 91 104* 103* 133*   Lipid Profile: No results for input(s): CHOL, HDL, LDLCALC, TRIG, CHOLHDL, LDLDIRECT in the last 72  hours. Thyroid Function Tests: No results for input(s): TSH, T4TOTAL, FREET4, T3FREE, THYROIDAB in the last 72 hours. Anemia Panel: No results for input(s): VITAMINB12, FOLATE, FERRITIN, TIBC, IRON, RETICCTPCT in the last 72 hours. Sepsis Labs: No results for input(s): PROCALCITON, LATICACIDVEN in the last 168 hours.  Recent Results (from the past 240 hour(s))  Culture, blood (routine x 2)     Status: None   Collection Time: 08/11/18  8:44 AM  Result Value Ref Range Status   Specimen Description BLOOD RIGHT ANTECUBITAL  Final   Special Requests   Final    BOTTLES DRAWN AEROBIC AND ANAEROBIC Blood Culture adequate volume   Culture   Final    NO GROWTH 5 DAYS Performed at  [facility], [address] [place], [place] [address]    Report Status 08/16/2018 FINAL  Final  Culture, blood (routine x 2)     Status: None   Collection Time: 08/11/18  8:44 AM  Result Value Ref Range Status   Specimen Description BLOOD RIGHT HAND  Final   Special Requests   Final    BOTTLES DRAWN AEROBIC ONLY Blood Culture adequate volume   Culture   Final    NO GROWTH 5 DAYS Performed at [facility], [place] [address], [place], [place] [address]    Report Status 08/16/2018 FINAL  Final         Radiology Studies: Dg Chest Port 1 View  Result Date: 08/16/2018 CLINICAL DATA:  Fever EXAM: PORTABLE CHEST 1 VIEW COMPARISON:  08/12/2018 FINDINGS: Heart is normal in size. Left upper extremity PICC is been removed. Stable right jugular central venous catheter. Low lung volumes. Bibasilar atelectasis left greater than right. No pneumothorax or pleural effusion. IMPRESSION: Bibasilar atelectasis left greater than right. Electronically Signed   By: Marybelle Killings M.D.   On: 08/16/2018 12:18   Dg Knee Complete 4 Views Left  Result Date: 08/16/2018 CLINICAL DATA:  Painful may for several weeks, no known injury, subsequent encounter EXAM: LEFT KNEE - COMPLETE 4+ VIEW COMPARISON:  06/07/2018 FINDINGS: Degenerative changes are again identified primarily in the medial joint space but to a lesser degree in the patellofemoral space. No acute fracture or dislocation is noted. No joint effusion is seen. IMPRESSION: Degenerative change stable from the previous exam. Electronically Signed   By: Inez Catalina M.D.   On: 08/16/2018 13:10        Scheduled Meds: . sodium chloride   Intravenous Once  . sodium chloride   Intravenous Once  . apixaban  5 mg Oral BID  . bismuth subsalicylate  30 mL Oral TID AC & HS  . Chlorhexidine Gluconate Cloth  6 each Topical Q0600  . clonazePAM  0.5 mg Oral BID  . colchicine  0.6 mg Oral BID  . collagenase   Topical Daily  . famotidine  20 mg Oral Daily    . feeding supplement (ENSURE ENLIVE)  237 mL Oral TID BM  . furosemide  40 mg Intravenous Q12H  . furosemide  40 mg Intravenous Once  . gabapentin  300 mg Oral TID  . insulin aspart  0-15 Units Subcutaneous TID WC  . insulin aspart  0-5 Units Subcutaneous QHS  . insulin glargine  20 Units Subcutaneous QHS  . magic mouthwash w/lidocaine  5 mL Oral TID  . multivitamin with minerals  1 tablet Oral Daily  . potassium chloride  40 mEq Oral BID  . sodium chloride flush  10-40 mL Intracatheter Q12H  Continuous Infusions: . sodium chloride 10 mL/hr at 08/15/18 0200     LOS: 71 days    Time spent: 35 minutes    Hosie Poisson, MD [facility] Pager [phone] If 7PM-7AM, please contact night-coverage [email or web address] Password [email or web address] 08/17/2018, 3:01 PM

## 2018-08-17 NOTE — Plan of Care (Signed)

## 2018-08-17 NOTE — Progress Notes (Signed)
Occupational Therapy Treatment Patient Details Name: [name] [name] MRN: [id number] DOB: 05/07/1966 Today's Date: 08/17/2018    History of present illness 52 y.o. male admitted on 06/06/18 for bil LE weakness and muscle spasm after multiple falls (with resultant R knee meniscus tear placed in bledsoe brace) following a recent ACDF at C3 in July 2019 (complicated by PE (s/p IVC filter), CIR, and then SNF placement before finally returning home with his parents).  In the ED MRI of c-spine which showed post operative changes and cord edema and possibility of ischemia.  His R knee was aspirated on 06/09/18.  S/p posterior cervical decompression and fusion on 06/14/18. Entire acute course complicated by hypotension in standing (thought to be autonomic) and by finding of multiple LE DVTs (already has an IVC filter in place, but per MDs notes at risk for blocking the filter with these clots).  Pt with post op issues with incisional bleeding s/p 2 evacuations of hematomas and placement of hemovac (06/22/18 and 07/01/18) as well as multiple units of plasma.  Cardiology consulted 07/05/18 due to bradycardia thought to be from his newly started blood thinner.  Pt with another re-accumulation of epidural hematoma with increased L sided weakness and cord compression with emergent evacuation surgery 10/25 & 07/10/18.  Pt also needing more platelets post op.  Bone marrow bx 07/12/18.  Pt found to have submassive PE on 07/27/18 and re-started on anticoagulation. PMHx of HTN, gout, anemia, DDD (lumbar).  08/10/18 found to have a L UE thrombus (same arm as his PICC line).     OT comments  Pt overall in good spirits this session and agreeable to working with OT. Initial focus of session on edema management to LUE. Additional focus on P/AAROM to LUE with further practice of AAROM to elbow, forearm, and digits. Pt continues to demonstrate trace movements with these. Pt also verbalizing difficulty with R digits including  flexion/extension and coordination. Will plan to follow up and further address in following sessions. Pt repositioned at bed level during session to achieve better midline positioning end of session as pt tending to lean R. Will continue per POC.    Follow Up Recommendations  SNF;Supervision/Assistance - 24 hour    Equipment Recommendations  Other (comment)(TBD)          Precautions / Restrictions Precautions Precautions: Fall;Cervical;Other (comment) Precaution Comments: Several bed sores on bottom.   Required Braces or Orthoses: Cervical Brace Knee Immobilizer - Right: On when out of bed or walking Cervical Brace: Soft collar Other Brace: Use locked bledsoe RLE when and if he can start attempting to stand again.   Restrictions Weight Bearing Restrictions: No Other Position/Activity Restrictions: Can bear weight through right leg if brace locked out.([place])       Mobility Bed Mobility          General bed mobility comments: assisted with repositining in bed as pt reports feeling that he often leans towards the R; positioned wedge under R shoulder for better midline alignment                        Balance Overall balance assessment: Needs assistance Sitting-balance support: Bilateral upper extremity supported;Feet supported Sitting balance-Leahy Scale: Zero Sitting balance - Comments: Pt required max A to sit EOB.  Pt sat EOB approximately 25 mins.  Continued to work on lateral leans to each elbow.  Patient required verbal and tactile cues to keep head upright and in midline.  Continued to work  on scapular retraction and cervical ROM.   Removed soft collar briefly to help reduce itching of posterior neck by rubbing.  Continued to open up pt's chest by stretching bilateral pecs. Postural control: Posterior lean;Left lateral lean                                 ADL either performed or assessed with clinical judgement   ADL                                          General ADL Comments: focus of session on edema management; continued AA/PROM of LUE and plans for moving forward with OT; pt reports difficulty fully opening R digits, reports they get "stuck" - discussed plans to address and work with fine motor/ coordination activities in future sessions     Vision       Perception     Praxis      Cognition Arousal/Alertness: Awake/alert Behavior During Therapy: WFL for tasks assessed/performed Overall Cognitive Status: Within Functional Limits for tasks assessed Area of Impairment: Safety/judgement;Memory                     Memory: Decreased short-term memory   Safety/Judgement: Decreased awareness of safety;Decreased awareness of deficits     General Comments: pt overall in good spirits this session        Exercises Exercises: Other exercises Other Exercises Other Exercises: applied light retrograde massage to L hand; P/AAROM to LUE and AROM to R digits. additional focus on AAROM at elbow, forearm, and digits Other Exercises: Head and neck gentle AAROM rotation bil, sidebend bil with upper trap massage.     Shoulder Instructions       General Comments      Pertinent Vitals/ Pain       Pain Assessment: Faces Faces Pain Scale: Hurts a little bit Pain Location: pt reports pain in L knee Pain Descriptors / Indicators: Aching;Sore Pain Intervention(s): Monitored during session  Home Living                                          Prior Functioning/Environment              Frequency  Min 2X/week        Progress Toward Goals  OT Goals(current goals can now be found in the care plan section)     Acute Rehab OT Goals Patient Stated Goal: to decrease fluid in extremities  OT Goal Formulation: With patient Time For Goal Achievement: 08/22/18 Potential to Achieve Goals: Fair  Plan Discharge plan remains appropriate    Co-evaluation                  AM-PAC OT "6 Clicks" Daily Activity     Outcome Measure   Help from another person eating meals?: A Little Help from another person taking care of personal grooming?: A Little Help from another person toileting, which includes using toliet, bedpan, or urinal?: Total Help from another person bathing (including washing, rinsing, drying)?: A Lot Help from another person to put on and taking off regular upper body clothing?: Total Help from another person to put on and taking off regular lower body  clothing?: Total 6 Click Score: 11    End of Session Equipment Utilized During Treatment: Cervical collar  OT Visit Diagnosis: Other abnormalities of gait and mobility (R26.89);Muscle weakness (generalized) (M62.81);Pain   Activity Tolerance Patient tolerated treatment well   Patient Left in bed;with call bell/phone within reach   Nurse Communication Mobility status        Time: 6962-9528 OT Time Calculation (min): 38 min  Charges: OT General Charges $OT Visit: 1 Visit OT Treatments $Therapeutic Activity: 23-37 mins  Marcy Siren, OT [facility] Pager [phone] Office [phone]    Orlando Penner 08/17/2018, 3:39 PM

## 2018-08-17 NOTE — Progress Notes (Signed)
Physical Therapy Treatment Patient Details Name: [name] [name] MRN: [id number] DOB: 05-Apr-1966 Today's Date: 08/17/2018    History of Present Illness 52 y.o. male admitted on 06/06/18 for bil LE weakness and muscle spasm after multiple falls (with resultant R knee meniscus tear placed in bledsoe brace) following a recent ACDF at C3 in July 2019 (complicated by PE (s/p IVC filter), CIR, and then SNF placement before finally returning home with his parents).  In the ED MRI of c-spine which showed post operative changes and cord edema and possibility of ischemia.  His R knee was aspirated on 06/09/18.  S/p posterior cervical decompression and fusion on 06/14/18. Entire acute course complicated by hypotension in standing (thought to be autonomic) and by finding of multiple LE DVTs (already has an IVC filter in place, but per MDs notes at risk for blocking the filter with these clots).  Pt with post op issues with incisional bleeding s/p 2 evacuations of hematomas and placement of hemovac (06/22/18 and 07/01/18) as well as multiple units of plasma.  Cardiology consulted 07/05/18 due to bradycardia thought to be from his newly started blood thinner.  Pt with another re-accumulation of epidural hematoma with increased L sided weakness and cord compression with emergent evacuation surgery 10/25 & 07/10/18.  Pt also needing more platelets post op.  Bone marrow bx 07/12/18.  Pt found to have submassive PE on 07/27/18 and re-started on anticoagulation. PMHx of HTN, gout, anemia, DDD (lumbar).  08/10/18 found to have a L UE thrombus (same arm as his PICC line).      PT Comments    Patient's tolerance to treatment today was good.  Patient was in bed with no visitors present upon PT arrival.  Treatment limited to pt sitting EOB completing cervical ROM, scapular retraction and therapist gently stretching pt's bilateral pecs.  Therapist provided massage to B LE for approximately 8 mins at end of session today with noted  tenderness in L LE per patient report and grimacing.  Patient continues to demonstrate improved spirits after therapy.  Patient would continue to benefit from acute care PT in order to address strength and mobility deficits.  Patient remains an appropriate candidate for SNF placement at this time based on current functional status.   Follow Up Recommendations  SNF     Equipment Recommendations  Wheelchair (measurements PT);Wheelchair cushion (measurements PT);Hospital bed(22x20 tilt in space power chair, roho cushion, hoyer lift)    Recommendations for Other Services       Precautions / Restrictions Precautions Precautions: Fall;Cervical;Other (comment) Precaution Comments: Several bed sores on bottom.   Required Braces or Orthoses: Cervical Brace Knee Immobilizer - Right: On when out of bed or walking Cervical Brace: Soft collar    Mobility  Bed Mobility Overal bed mobility: Needs Assistance Bed Mobility: Rolling;Sidelying to Sit;Sit to Supine Rolling: +2 for physical assistance;Total assist Sidelying to sit: +2 for physical assistance;Total assist   Sit to supine: Total assist;+2 for physical assistance   General bed mobility comments: assisted with repositining in bed as pt reports feeling that he often leans towards the R; positioned wedge under R shoulder for better midline alignment  Transfers                    Ambulation/Gait                 Stairs             Wheelchair Mobility    Modified Rankin (Stroke Patients Only)  Balance Overall balance assessment: Needs assistance Sitting-balance support: Bilateral upper extremity supported;Feet supported Sitting balance-Leahy Scale: Zero Sitting balance - Comments: Pt required max A to sit EOB.  Pt sat EOB approximately 25 mins.  Continued to work on lateral leans to each elbow.  Patient required verbal and tactile cues to keep head upright and in midline.  Continued to work on scapular  retraction and cervical ROM.   Removed soft collar briefly to help reduce itching of posterior neck by rubbing.  Continued to open up pt's chest by stretching bilateral pecs. Postural control: Posterior lean;Left lateral lean                                  Cognition Arousal/Alertness: Awake/alert Behavior During Therapy: WFL for tasks assessed/performed Overall Cognitive Status: Within Functional Limits for tasks assessed Area of Impairment: Safety/judgement;Memory                     Memory: Decreased short-term memory   Safety/Judgement: Decreased awareness of safety;Decreased awareness of deficits     General Comments: pt overall in good spirits this session      Exercises Other Exercises Other Exercises: applied light retrograde massage to L hand; P/AAROM to LUE and AROM to R digits. additional focus on AAROM at elbow, forearm, and digits Other Exercises: Head and neck gentle AAROM rotation bil, sidebend bil with upper trap massage.      General Comments        Pertinent Vitals/Pain Pain Assessment: Faces Faces Pain Scale: Hurts a little bit Pain Location: pt reports pain in L knee Pain Descriptors / Indicators: Aching;Sore Pain Intervention(s): Monitored during session    Home Living                      Prior Function            PT Goals (current goals can now be found in the care plan section) Acute Rehab PT Goals Patient Stated Goal: to decrease fluid in extremities  PT Goal Formulation: With patient Time For Goal Achievement: 08/28/18 Potential to Achieve Goals: Fair Additional Goals Additional Goal #1: Pt will tolerate OOB to WC for 1 hour with calling for staff to perform tilting for pressure relief every hour. Progress towards PT goals: Progressing toward goals(goals checked)    Frequency    Min 2X/week      PT Plan Current plan remains appropriate    Co-evaluation              AM-PAC PT "6 Clicks"  Mobility   Outcome Measure  Help needed turning from your back to your side while in a flat bed without using bedrails?: Total Help needed moving from lying on your back to sitting on the side of a flat bed without using bedrails?: Total Help needed moving to and from a bed to a chair (including a wheelchair)?: Total Help needed standing up from a chair using your arms (e.g., wheelchair or bedside chair)?: Total Help needed to walk in hospital room?: Total Help needed climbing 3-5 steps with a railing? : Total 6 Click Score: 6    End of Session Equipment Utilized During Treatment: Cervical collar;Other (comment)(soft collar) Activity Tolerance: Patient tolerated treatment well Patient left: in bed;with call bell/phone within reach;with nursing/sitter in room Nurse Communication: Mobility status PT Visit Diagnosis: Other abnormalities of gait and mobility (R26.89);Other symptoms and signs  involving the nervous system (R29.898);Muscle weakness (generalized) (M62.81)     Time: [id number] PT Time Calculation (min) (ACUTE ONLY): 49 min  Charges:  $Therapeutic Activity: 23-37 mins $Massage: 8-22 mins                     [address]Nezar Buckles, SPTA    Angelus Hoopes 08/17/2018, 4:05 PM

## 2018-08-18 LAB — BASIC METABOLIC PANEL
ANION GAP: 9 (ref 5–15)
BUN: 14 mg/dL (ref 6–20)
CO2: 32 mmol/L (ref 22–32)
Calcium: 9.3 mg/dL (ref 8.9–10.3)
Chloride: 101 mmol/L (ref 98–111)
Creatinine, Ser: 0.88 mg/dL (ref 0.61–1.24)
GFR calc Af Amer: >60 mL/min (ref >60)
GFR calc non Af Amer: >60 mL/min (ref >60)
Glucose, Bld: 109 mg/dL — ABNORMAL HIGH (ref 70–99)
Potassium: 4.3 mmol/L (ref 3.5–5.1)
SODIUM: 142 mmol/L (ref 135–145)

## 2018-08-18 LAB — CBC
HCT: 35.3 % — ABNORMAL LOW (ref 39.0–52.0)
Hemoglobin: 10.7 g/dL — ABNORMAL LOW (ref 13.0–17.0)
MCH: 29.6 pg (ref 26.0–34.0)
MCHC: 30.3 g/dL (ref 30.0–36.0)
MCV: 97.5 fL (ref 80.0–100.0)
Platelets: 172 10*3/uL (ref 150–400)
RBC: 3.62 MIL/uL — AB (ref 4.22–5.81)
RDW: 19.2 % — ABNORMAL HIGH (ref 11.5–15.5)
WBC: 12.3 10*3/uL — ABNORMAL HIGH (ref 4.0–10.5)
nRBC: 0.7 % — ABNORMAL HIGH (ref 0.0–0.2)

## 2018-08-18 LAB — GLUCOSE, CAPILLARY
GLUCOSE-CAPILLARY: 106 mg/dL — AB (ref 70–99)
Glucose-Capillary: 94 mg/dL (ref 70–99)
Glucose-Capillary: 98 mg/dL (ref 70–99)
Glucose-Capillary: 142 mg/dL — ABNORMAL HIGH (ref 70–99)

## 2018-08-18 LAB — URIC ACID: URIC ACID, SERUM: 7.6 mg/dL (ref 3.7–8.6)

## 2018-08-18 MED ORDER — FUROSEMIDE 10 MG/ML IJ SOLN
60.0000 mg | Freq: Two times a day (BID) | INTRAMUSCULAR | Status: DC
  Administered 2018-08-18 – 2018-08-25 (×14): 60 mg via INTRAVENOUS
  Filled 2018-08-18 (×14): qty 6

## 2018-08-18 NOTE — Plan of Care (Signed)
  Problem: Education: Goal: Knowledge of General Education information will improve Description Including pain rating scale, medication(s)/side effects and non-pharmacologic comfort measures Outcome: Progressing   Problem: Health Behavior/Discharge Planning: Goal: Ability to manage health-related needs will improve Outcome: Progressing   Problem: Clinical Measurements: Goal: Ability to maintain clinical measurements within normal limits will improve Outcome: Progressing   Problem: Clinical Measurements: Goal: Will remain free from infection Outcome: Progressing   Problem: Clinical Measurements: Goal: Diagnostic test results will improve Outcome: Progressing   Problem: Clinical Measurements: Goal: Respiratory complications will improve Outcome: Progressing   Problem: Clinical Measurements: Goal: Cardiovascular complication will be avoided Outcome: Progressing   Problem: Activity: Goal: Risk for activity intolerance will decrease Outcome: Progressing   Problem: Nutrition: Goal: Adequate nutrition will be maintained Outcome: Progressing   Problem: Elimination: Goal: Will not experience complications related to bowel motility Outcome: Progressing   Problem: Elimination: Goal: Will not experience complications related to urinary retention Outcome: Progressing

## 2018-08-18 NOTE — Progress Notes (Signed)
PROGRESS NOTE    [name] [name]  TUU:[id number] DOB: July 11, 1966 DOA: 06/06/2018 PCP: Helane Rima, MD    Brief Narrative:  52/M with PMH of gout, hypertension, underwent anterior cervical corpectomy of C4 on 7/91/5056 with complicated postoperative course including development of multiple PEs and DVTs started on anticoagulation and subsequently developed C spinal hematoma requiring reexploration on 03/28/2018 status post placement of IVC filter then discharged to SNF -Patient was admitted to this hospital as a transfer from [facility] with MRI of C-spine that showed postoperative changes and cord compression with cord edema and concern for ischemia, s/p post posterior cervical decompression laminectomy of C3, C4, C5, removal of spinous processes, foraminotomies on 06/14/2018.  -On 06/22/2018 developed worsening left sided paresthesias and weakness MRI noted postoperative hematoma, was taken to the OR for evacuation of hematoma.  -Additionally developed Bilateral lower extremity DVTs -on 10/19 was noted to have decreased left hand grip on 10/19, MRI showed large epidural hematoma, patient s/p clot evacuation. He was placed back on anticoagulation argatroban on 10-22. -HIT panel0.486 (mildly elevated, Serotonin release assay was negative. Dr Marin Olp following  -On 10/25; developed weakness of left arm, He was take emergently to OR for recurrence of epidural hematoma on 10-25.He was taken off of anticoagulation.  -10/28 Night: developed left arm weakness, numbness. Stat MRI was ordered, which confirmed recurrent hematoma. Patient was taken to OR for hematoma evacuation 10-28.  -For his thrombocytopenia, Heme Dr.Ennever following, Bone marrow biopsy was done. For LE DVT, he has IVC filter. Patient has received multiple platelet transfusionsand Nplate. Patient underwent CT scan of his chest on 11/13 which showed new pulmonary embolism. Patient was started on bivalirudinafter  discussions between hematology as well as neurosurgery. Platelet counts have improved.  On 11/22 pt was found to have free intraabdominal air, it was followed with CT abd pelvis , shows possible stranding at the greater curvature of the stomach.  Surgery consulted, upper GI series which did not show obstruction, improved, now tolerating diet 11/28 PM noted to have DVT in LUEs on Bivalrudin 11/29 Fever-blood cx drawn, bivalirudin switched over to argatroban 11/30 CCM consulted, s/p R IJ TLC, PICC removed, Blood Cx negative   Assessment & Plan:   Principal Problem:   Recurrent pulmonary embolism ([place]) Active Problems:   Myelopathy ([place])   Pressure injury of skin   Thrombocytopenia ([facility])   Acute blood loss anemia   Benign essential HTN   Cord compression ([facility])   Fever   Spondylosis, cervical, with myelopathy   Labile blood pressure   Orthostatic hypotension   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 Followed with multiple clot evacuation procedures on 10/19, 10/25, 10/28. Repeat MRI on 07/23/2018 shows improvement of the fluid collection. Neurosurgery continues to follow he appears to be stable from their standpoint at this time. He needs aggressive rehabilitation plan for SNF when stable.    Acute pulmonary embolism/DVT in the setting of spine surgery S/p IVC filter placement on March 28, 2018 and in addition to that patient has been on multiple anticoagulations which led to recurrent hematomas at the surgical sites requiring further evacuations.  CT chest on November  13 showed new pulmonary embolism with evidence of right heart strain, new acute DVT in the left subclavian axillary, brachial, radial veins and superficial thrombus in the left cephalic and basilic veins on November 28 and he was restarted on anticoagulation.  Currently he is on Eliquis.  Continue to monitor blood counts. Dr. Marin Olp on board.    Pneumoperitoneum incidental on 11/24 General surgery consulted and repeat CT abdomen and pelvis on 1124 showed soft tissue stranding adjacent to the greater curvature of the body of the stomach.  It was followed up with an upper GI series on 11/25 which was negative for leak or perforation. Completed a course of antibiotics and continue with PPI for stress ulcer prophylaxis No abdominal pain or distention   Thrombocytopenia/coagulopathy Patient had acute thrombocytopenia earlier in the admission he had extensive work-up for acute thrombocytopenia and and his work-up has been essentially negative.  Over the course of his hospitalization he had received multiple platelet transfusions and his platelet count is currently within normal limits.  Dr. Marin Olp with hematology following.    Fluid overload with upper and lower extremity edema with third spacing Currently on IV Lasix 40 mg twice daily and continue to monitor intake and output.  He continues to be fluid overloaded will increase IV Lasix from 40 mg to 60 mg twice daily and monitor his urine output.  Used to be at 1750 over the last 24 hours.  Continue IV Lasix 60 mg twice daily Recommend diuretics as long as blood pressure and kidney function tolerates.    Hyperglycemia probably secondary to the use of steroids earlier in the admission Last hemoglobin A1c was 5.8 Continue with Lantus and sliding scale insulin. CBG (last 3)  Recent Labs    08/17/18 2152 08/18/18 0854 08/18/18 1132  GLUCAP 127* 94 98   His CBGs remain less than 120 we will decrease the dose of Lantus to 18  units   Anemia  due to acute illness superimposed on anemia of chronic illness Transfuse to keep hemoglobin greater than 8.  Repeat hemoglobin today is 10.5 and stable   Sinus bradycardia TSH within normal limits. It resolved   Hypertension Blood pressure well controlled at this time.   Adjustment disorder with depression and anxiety Plan continue Klonopin as needed.   Stage II pressure ulcer on the right ischial tuberosity not present on admission Wound care consulted and recommendations given.   Left knee pain with swelling and warmth X-rays ordered and on IV morphine as needed, x-rays of the left knee does not show any effusion or acute abnormalities at this time but since patient reports persistent pain even with IV morphine, will get CT of the knee without contrast.  CT of the knee without contrast showed severe osteoarthritis along the medial aspect. Added colchicine for possible gout flareup in his pain appears to be better than yesterday.  Also on oxycodone 20 mg  and use IV morphine for breakthrough pain.      DVT prophylaxis: Eliquis Code Status: Full code Family Communication: None at bedside, discussed the plan with the patient. Disposition Plan: Pending clinical improvement and plan for SNF when bed is available   Consultants:  Dr. Marin Olp with oncology Cardiology Surgery Dr. Donne Hazel Neurosurgery Dr. Saintclair Halsted  Procedures:On 10/2 1. posterior cervical decompressive laminectomy at C3, C4, C5 with removal of the spinous process and complete laminectomy and foraminotomies of the C3, C4, C5 nerve roots. 2. Posterior cervical fusion with lateral mass  screws at C3-C4-C5 and pars screws at C2 lies in the globus ellipseLateral mass screw system 3. Posterior lateral arthrodesis C2-C5 utilizing locally harvested autograft mixed with vivigen  06/22/2018: Procedure reexploration posterior cervical wound for evacuation of epidural hematoma  06/26/2018 DVT ultrasound:Right:  Findings consistent with acute deep vein thrombosis involving the right common femoral vein, right femoral vein, right popliteal vein, right posterior tibial vein, and right peroneal vein. Thrombosis extends proximally into external iliac vein.  Proximal iliac vein not visualized. Left: Findings consistent with acute deep vein thrombosis involving the left common femoral vein, left femoral vein, left popliteal vein, left posterior tibial vein, and left peroneal vein. Findings consistent with acute superficial vein thrombosis  involving the left great saphenous vein. Thrombosis extends proximally into external iliac vein. Proximal iliac vein not visualized.  07/01/18 Clot evacuation   RUE doppler 07/02/18  Antimicrobials: None   Subjective: Reports his left knee pain is better than yesterday but has not completely resolved.   Denies any chest pain or shortness of breath, nausea vomiting or abdominal pain or abdominal distention.  Objective: Vitals:   08/18/18 0000 08/18/18 0400 08/18/18 0409 08/18/18 1130  BP:  125/77  108/70  Pulse:  (!) 117  (!) 110  Resp:  (!) 24  (!) 26  Temp: 100.3 F (37.9 C) 98.6 F (37 C)  98.8 F (37.1 C)  TempSrc: Oral Oral  Oral  SpO2:  98%  99%  Weight:   125.5 kg   Height:        Intake/Output Summary (Last 24 hours) at 08/18/2018 1139 Last data filed at 08/18/2018 0855 Gross per 24 hour  Intake 2593.88 ml  Output 2450 ml  Net 143.88 ml   Filed Weights   07/18/18 0600 07/27/18 1502 08/18/18 0409  Weight: 135 kg 127.6 kg 125.5 kg    Examination:  General exam: Alert and comfortable laying in bed. Respiratory system: Clear to auscultation no wheezing or rhonchi Cardiovascular system: S1 & S2 heard, RRR. No JVD, murmurs, Gastrointestinal system: Diminished soft, nontender, nondistended with good bowel sounds Central nervous system: Alert and oriented.  Continues to have left-sided weakness. Extremities: Bilateral leg edema 2+ , left knee  tenderness present Skin: Stage II decubitus ulcer on the right ischium tuberosity Psychiatry:  Mood & affect appropriate.     Data Reviewed: I have personally reviewed following labs and imaging studies  CBC: Recent Labs  Lab 08/14/18 0426 08/15/18 0451 08/16/18 0506 08/17/18 1154 08/18/18 0432  WBC 11.0* 10.6* 18.3* 11.1* 12.3*  HGB 9.3* 8.8* 7.5* 10.5* 10.7*  HCT 30.3* 29.4* 25.1* 34.3* 35.3*  MCV 95.9 97.4 98.8 96.9 97.5  PLT 160 172 176 156 458   Basic Metabolic Panel: Recent Labs  Lab 08/14/18 0426 08/15/18 0451 08/16/18 0506 08/17/18 0630 08/18/18 0432  NA 138 140 141 140 142  K 3.8 4.0 4.3 3.9 4.3  CL 101 102 102 100 101  CO2 '30 30 30 28 '$ 32  GLUCOSE 94 101* 119* 152* 109*  BUN '12 14 13 15 14  '$ CREATININE 0.69 0.61 0.66 0.68 0.88  CALCIUM 8.9 9.1 9.2 9.0 9.3   GFR: Estimated Creatinine Clearance: 128.6 mL/min (by C-G formula based on SCr of 0.88 mg/dL). Liver Function Tests: No results for input(s): AST, ALT, ALKPHOS, BILITOT, PROT, ALBUMIN in the last 168 hours. No results for input(s): LIPASE, AMYLASE in the last 168 hours. No results for input(s): AMMONIA in the last 168 hours. Coagulation Profile: No results for input(s): INR, PROTIME  in the last 168 hours. Cardiac Enzymes: No results for input(s): CKTOTAL, CKMB, CKMBINDEX, TROPONINI in the last 168 hours. BNP (last 3 results) No results for input(s): PROBNP in the last 8760 hours. HbA1C: No results for input(s): HGBA1C in the last 72 hours. CBG: Recent Labs  Lab 08/17/18 1153 08/17/18 1736 08/17/18 2152 08/18/18 0854 08/18/18 1132  GLUCAP 133* 79 127* 94 98   Lipid Profile: No results for input(s): CHOL, HDL, LDLCALC, TRIG, CHOLHDL, LDLDIRECT in the last 72 hours. Thyroid Function Tests: No results for input(s): TSH, T4TOTAL, FREET4, T3FREE, THYROIDAB in the last 72 hours. Anemia Panel: No results for input(s): VITAMINB12, FOLATE, FERRITIN, TIBC, IRON, RETICCTPCT in the last 72  hours. Sepsis Labs: No results for input(s): PROCALCITON, LATICACIDVEN in the last 168 hours.  Recent Results (from the past 240 hour(s))  Culture, blood (routine x 2)     Status: None   Collection Time: 08/11/18  8:44 AM  Result Value Ref Range Status   Specimen Description BLOOD RIGHT ANTECUBITAL  Final   Special Requests   Final    BOTTLES DRAWN AEROBIC AND ANAEROBIC Blood Culture adequate volume   Culture   Final    NO GROWTH 5 DAYS Performed at [facility], [address] [place], [place] [address]    Report Status 08/16/2018 FINAL  Final  Culture, blood (routine x 2)     Status: None   Collection Time: 08/11/18  8:44 AM  Result Value Ref Range Status   Specimen Description BLOOD RIGHT HAND  Final   Special Requests   Final    BOTTLES DRAWN AEROBIC ONLY Blood Culture adequate volume   Culture   Final    NO GROWTH 5 DAYS Performed at [facility], [place] [address] [place], [place] [address]    Report Status 08/16/2018 FINAL  Final         Radiology Studies: Ct Knee Left Wo Contrast  Result Date: 08/17/2018 CLINICAL DATA:  Left knee pain and swelling.  Evaluate for fracture. EXAM: CT OF THE LEFT KNEE WITHOUT CONTRAST TECHNIQUE: Multidetector CT imaging of the LEFT knee was performed according to the standard protocol. Multiplanar CT image reconstructions were also generated. COMPARISON:  06/07/2018 and 08/16/2018 radiographs of the left knee. FINDINGS: Bones/Joint/Cartilage Moderate medial femorotibial joint space narrowing with spurring off the tibial plateau and femoral condyles as well as of the tibial spine are identified consistent with osteoarthritis. No joint effusion, acute appearing fracture, suspicious osseous lesion or bone destruction is seen. Ligaments Suboptimally assessed by CT. Muscles and Tendons No intramuscular hemorrhage or significant atrophy. The quadriceps and patellar tendons are unremarkable and intact. Soft tissues No significant soft  tissue swelling, mass or hematoma. IMPRESSION: Osteoarthritis of the knee predominantly along the medial femorotibial compartment. No significant joint effusion. No acute osseous appearing abnormality. Electronically Signed   By: Ashley Royalty M.D.   On: 08/17/2018 16:58        Scheduled Meds: . sodium chloride   Intravenous Once  . sodium chloride   Intravenous Once  . apixaban  5 mg Oral BID  . bismuth subsalicylate  30 mL Oral TID AC & HS  . Chlorhexidine Gluconate Cloth  6 each Topical Q0600  . clonazePAM  0.5 mg Oral BID  . colchicine  0.6 mg Oral BID  . collagenase   Topical Daily  . famotidine  20 mg Oral Daily  . feeding supplement (ENSURE ENLIVE)  237 mL Oral TID BM  .  furosemide  40 mg Intravenous Once  . furosemide  60 mg Intravenous Q12H  . gabapentin  300 mg Oral TID  . insulin aspart  0-15 Units Subcutaneous TID WC  . insulin aspart  0-5 Units Subcutaneous QHS  . insulin glargine  20 Units Subcutaneous QHS  . magic mouthwash w/lidocaine  5 mL Oral TID  . multivitamin with minerals  1 tablet Oral Daily  . potassium chloride  40 mEq Oral BID  . sodium chloride flush  10-40 mL Intracatheter Q12H   Continuous Infusions: . sodium chloride 10 mL/hr at 08/15/18 0200     LOS: 72 days    Time spent: 35 minutes    Hosie Poisson, MD [facility] Pager [phone] If 7PM-7AM, please contact night-coverage [email or web address] Password [email or web address] 08/18/2018, 11:39 AM

## 2018-08-19 ENCOUNTER — Inpatient Hospital Stay (HOSPITAL_COMMUNITY)

## 2018-08-19 LAB — GLUCOSE, CAPILLARY
GLUCOSE-CAPILLARY: 101 mg/dL — AB (ref 70–99)
GLUCOSE-CAPILLARY: 101 mg/dL — AB (ref 70–99)
Glucose-Capillary: 87 mg/dL (ref 70–99)
Glucose-Capillary: 99 mg/dL (ref 70–99)

## 2018-08-19 LAB — BASIC METABOLIC PANEL
Anion gap: 10 (ref 5–15)
BUN: 15 mg/dL (ref 6–20)
CALCIUM: 9.3 mg/dL (ref 8.9–10.3)
CO2: 32 mmol/L (ref 22–32)
Chloride: 99 mmol/L (ref 98–111)
Creatinine, Ser: 0.61 mg/dL (ref 0.61–1.24)
GFR calc Af Amer: >60 mL/min (ref >60)
Glucose, Bld: 104 mg/dL — ABNORMAL HIGH (ref 70–99)
Potassium: 4.4 mmol/L (ref 3.5–5.1)
Sodium: 141 mmol/L (ref 135–145)

## 2018-08-19 LAB — CBC
HCT: 35.9 % — ABNORMAL LOW (ref 39.0–52.0)
Hemoglobin: 10.7 g/dL — ABNORMAL LOW (ref 13.0–17.0)
MCH: 29.1 pg (ref 26.0–34.0)
MCHC: 29.8 g/dL — ABNORMAL LOW (ref 30.0–36.0)
MCV: 97.6 fL (ref 80.0–100.0)
PLATELETS: 178 10*3/uL (ref 150–400)
RBC: 3.68 MIL/uL — ABNORMAL LOW (ref 4.22–5.81)
RDW: 18.7 % — ABNORMAL HIGH (ref 11.5–15.5)
WBC: 11.6 10*3/uL — ABNORMAL HIGH (ref 4.0–10.5)
nRBC: 0.4 % — ABNORMAL HIGH (ref 0.0–0.2)

## 2018-08-19 MED ORDER — INSULIN GLARGINE 100 UNIT/ML ~~LOC~~ SOLN
18.0000 [IU] | Freq: Every day | SUBCUTANEOUS | Status: DC
  Filled 2018-08-19: qty 0.18

## 2018-08-19 NOTE — Clinical Social Work Note (Signed)
Clinical Social Work Assessment  Patient Details  Name: [name] FreedCornell Wermuth MRN: [id number] Date of Birth: Jun 10, 1966  Date of referral:  08/19/18               Reason for consult:  (Was not consulted)                Permission sought to share information with:  Facility Medical sales representativeContact Representative, Family Supports Permission granted to share information::  Yes, Verbal Permission Granted  Name::     Alejandra Cowper [phone]  Agency::     Relationship::     Contact Information:     Housing/Transportation Living arrangements for the past 2 months:    Source of Information:  Patient Patient Interpreter Needed:  None Criminal Activity/Legal Involvement Pertinent to Current Situation/Hospitalization:  No - Comment as needed Significant Relationships:  Spouse, Parents Lives with:    Do you feel safe going back to the place where you live?  Yes(Undetermined) Need for family participation in patient care:  No (Coment)  Care giving concerns: Patient has reservations with communicating personal information that would likely be beneficial for his health care treatment. Additionally patient reports him and his wife are going through divorce and as she is the primary beneficiary for their [employer] he may loose health care insurance coverage.   Social Worker assessment / plan:  CSW received verbal consent from patient to contact family. Contacting family and seeking collateral will likely benefit in working towards addressing practical barriers to discharge. CSW noted patient responded more openly when being informed information requested for his SSN was not related towards billing. Patient responds well to be approached directly and clearly.  Employment status:    Insurance information:  [employer] Benefit[place]) PT Recommendations:  Skilled Nursing Facility Information / Referral to community resources:     Patient/Family's Response to care:  Not contacted at this time by CSW.  Patient/Family's  Understanding of and Emotional Response to Diagnosis, Current Treatment, and Prognosis:  Not contacted at this time by CSW.  Emotional Assessment Appearance:  Appears stated age Attitude/Demeanor/Rapport:  Apprehensive, Guarded, Lethargic Affect (typically observed):  Defensive, Frustrated, Accepting Orientation:  Oriented to Self, Oriented to Situation, Oriented to Place, Oriented to  Time Alcohol / Substance use:  Not Applicable Psych involvement (Current and /or in the community):  No (Comment)  Discharge Needs  Concerns to be addressed:  Discharge Planning Concerns Readmission within the last 30 days:  No Current discharge risk:    Barriers to Discharge:      Annalee GentaJoey R Eulamae Greenstein, LCSW 08/19/2018, 3:49 PM

## 2018-08-19 NOTE — Progress Notes (Signed)
PROGRESS NOTE    [name] [name]  VQX:[id number] DOB: April 19, 1966 DOA: 06/06/2018 PCP: Helane Rima, MD    Brief Narrative:  52/M with PMH of gout, hypertension, underwent anterior cervical corpectomy of C4 on 0/11/4915 with complicated postoperative course including development of multiple PEs and DVTs started on anticoagulation and subsequently developed C spinal hematoma requiring reexploration on 03/28/2018 status post placement of IVC filter then discharged to SNF -Patient was admitted to this hospital as a transfer from [facility] with MRI of C-spine that showed postoperative changes and cord compression with cord edema and concern for ischemia, s/p post posterior cervical decompression laminectomy of C3, C4, C5, removal of spinous processes, foraminotomies on 06/14/2018.  -On 06/22/2018 developed worsening left sided paresthesias and weakness MRI noted postoperative hematoma, was taken to the OR for evacuation of hematoma.  -Additionally developed Bilateral lower extremity DVTs -on 10/19 was noted to have decreased left hand grip on 10/19, MRI showed large epidural hematoma, patient s/p clot evacuation. He was placed back on anticoagulation argatroban on 10-22. -HIT panel0.486 (mildly elevated, Serotonin release assay was negative. Dr Marin Olp following  -On 10/25; developed weakness of left arm, He was take emergently to OR for recurrence of epidural hematoma on 10-25.He was taken off of anticoagulation.  -10/28 Night: developed left arm weakness, numbness. Stat MRI was ordered, which confirmed recurrent hematoma. Patient was taken to OR for hematoma evacuation 10-28.  -For his thrombocytopenia, Heme Dr.Ennever following, Bone marrow biopsy was done. For LE DVT, he has IVC filter. Patient has received multiple platelet transfusionsand Nplate. Patient underwent CT scan of his chest on 11/13 which showed new pulmonary embolism. Patient was started on bivalirudinafter  discussions between hematology as well as neurosurgery. Platelet counts have improved.  On 11/22 pt was found to have free intraabdominal air, it was followed with CT abd pelvis , shows possible stranding at the greater curvature of the stomach.  Surgery consulted, upper GI series which did not show obstruction, improved, now tolerating diet 11/28 PM noted to have DVT in LUEs on Bivalrudin 11/29 Fever-blood cx drawn, bivalirudin switched over to argatroban 11/30 CCM consulted, s/p R IJ TLC, PICC removed, Blood Cx negative   Assessment & Plan:   Principal Problem:   Recurrent pulmonary embolism ([place]) Active Problems:   Myelopathy ([place])   Pressure injury of skin   Thrombocytopenia ([facility])   Acute blood loss anemia   Benign essential HTN   Cord compression ([facility])   Fever   Spondylosis, cervical, with myelopathy   Labile blood pressure   Orthostatic hypotension   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 Followed with multiple clot evacuation procedures on 10/19, 10/25, 10/28. Repeat MRI on 07/23/2018 shows improvement of the fluid collection. Neurosurgery continues to follow he appears to be stable from their standpoint at this time. He needs aggressive rehabilitation plan for SNF when stable. Today pt reports some weakness in the right lower extremity/ right foot. Repeat MRI of the cervical spine ordered.     Acute pulmonary embolism/DVT in the setting of spine surgery S/p IVC filter placement on March 28, 2018 and in addition to  that patient has been on multiple  anticoagulations which led to recurrent hematomas at the surgical sites requiring further evacuations.  CT chest on November 13 showed new pulmonary embolism with evidence of right heart strain, new acute DVT in the left subclavian axillary, brachial, radial veins and superficial thrombus in the left cephalic and basilic veins on November 28 and he was restarted on anticoagulation.  Currently he is on Eliquis.  Continue to monitor blood counts. Dr. Marin Olp on board.    Pneumoperitoneum incidental on 11/24 General surgery consulted and repeat CT abdomen and pelvis on 1124 showed soft tissue stranding adjacent to the greater curvature of the body of the stomach.  It was followed up with an upper GI series on 11/25 which was negative for leak or perforation. Completed a course of antibiotics and continue with PPI for stress ulcer prophylaxis No abdominal pain or distention. He denies any nausea or vomiting.    Thrombocytopenia/coagulopathy Patient had acute thrombocytopenia earlier in the admission he had extensive work-up for acute thrombocytopenia and and his work-up has been essentially negative.  Over the course of his hospitalization he had received multiple platelet transfusions and his platelet count is currently within normal limits.  Dr. Marin Olp with hematology following.    Fluid overload with upper and lower extremity edema with third spacing Currently on IV Lasix 40 mg twice daily and continue to monitor intake and output.  He continues to be fluid overloaded will increase IV Lasix from 40 mg to 60 mg twice daily and monitor his urine output.  He has diuresed about 3.7 liters yesterday.  Continue IV Lasix 60 mg twice daily, monitor electrolytes and renal parameters while on lasix.  Recommend diuretics as long as blood pressure and kidney function tolerates.    Hyperglycemia probably secondary to the use of steroids earlier in the admission Last hemoglobin A1c was 5.8  CBG (last 3)    Recent Labs    08/19/18 0748 08/19/18 1246 08/19/18 1737  GLUCAP 101* 101* 87   Decreased the dose of lantus to 18 units .    Anemia due to acute illness superimposed on anemia of chronic illness Transfuse to keep hemoglobin greater than 8.  Repeat hemoglobin today is 10.5 and stable   Sinus bradycardia TSH within normal limits. It resolved   Hypertension Blood pressure well controlled at this time.   Adjustment disorder with depression and anxiety Plan continue Klonopin as needed.   Stage II pressure ulcer on the right ischial tuberosity not present on admission Wound care consulted and recommendations given.   Left knee pain with swelling and warmth X-rays ordered and on IV morphine as needed, x-rays of the left knee does not show any effusion or acute abnormalities at this time but since patient reports persistent pain even with IV morphine, will get CT of the knee without contrast.  CT of the knee without contrast showed severe osteoarthritis along the medial aspect. Added colchicine for possible gout flareup in his pain appears to be better than yesterday.  Also on oxycodone 20 mg  and use IV morphine for breakthrough pain.      DVT prophylaxis: Eliquis Code Status: Full code Family Communication: None at bedside, discussed the plan with the patient. Disposition Plan: Pending clinical improvement and plan for SNF when bed is available   Consultants:  Dr. Marin Olp with oncology Cardiology Surgery Dr. Donne Hazel Neurosurgery Dr. Saintclair Halsted  Procedures:On 10/2 1. posterior cervical decompressive laminectomy at C3, C4, C5 with removal of the  spinous process and complete laminectomy and foraminotomies of the C3, C4, C5 nerve roots. 2. Posterior cervical fusion with lateral mass screws at C3-C4-C5 and pars screws at C2 lies in the globus ellipseLateral mass screw system 3. Posterior lateral arthrodesis C2-C5 utilizing locally harvested autograft mixed with  vivigen  06/22/2018: Procedure reexploration posterior cervical wound for evacuation of epidural hematoma  06/26/2018 DVT ultrasound:Right: Findings consistent with acute deep vein thrombosis involving the right common femoral vein, right femoral vein, right popliteal vein, right posterior tibial vein, and right peroneal vein. Thrombosis extends proximally into external iliac vein.  Proximal iliac vein not visualized. Left: Findings consistent with acute deep vein thrombosis involving the left common femoral vein, left femoral vein, left popliteal vein, left posterior tibial vein, and left peroneal vein. Findings consistent with acute superficial vein thrombosis  involving the left great saphenous vein. Thrombosis extends proximally into external iliac vein. Proximal iliac vein not visualized.  07/01/18 Clot evacuation   RUE doppler 07/02/18  Antimicrobials: None   Subjective: Pt denies any chest pain or sob.  Pt reports since last night his right foot is slightly numb and he is unable to move as much as the previous day.    Objective: Vitals:   08/19/18 0500 08/19/18 0744 08/19/18 1200 08/19/18 1528  BP:  131/87 126/77 130/83  Pulse:  (!) 112 (!) 110 (!) 110  Resp:  12 19 (!) 24  Temp:  98.2 F (36.8 C) 98.2 F (36.8 C) 98.3 F (36.8 C)  TempSrc:  Oral Oral Oral  SpO2:  97% 96% 95%  Weight: 125.9 kg     Height:        Intake/Output Summary (Last 24 hours) at 08/19/2018 1740 Last data filed at 08/19/2018 1532 Gross per 24 hour  Intake 1186.04 ml  Output 3200 ml  Net -2013.96 ml   Filed Weights   07/27/18 1502 08/18/18 0409 08/19/18 0500  Weight: 127.6 kg 125.5 kg 125.9 kg    Examination:  General exam: Alert and comfortable laying in bed. Respiratory system: diminished air entry at bases. No wheezing or rhonchi.  Cardiovascular system: S1 & S2 heard, RRR. No JVD, murmurs, Gastrointestinal system: abdomen is soft , non tender non distended bowel sound s good.   Central nervous system: Alert and oriented.  Continues to have left-sided weakness. Right lower extremity weakness today.  Extremities: Bilateral leg edema 2+ , left knee tenderness improving.  Skin: Stage II decubitus ulcer on the right ischium tuberosity Psychiatry:  Mood & affect appropriate.     Data Reviewed: I have personally reviewed following labs and imaging studies  CBC: Recent Labs  Lab 08/15/18 0451 08/16/18 0506 08/17/18 1154 08/18/18 0432 08/19/18 0620  WBC 10.6* 18.3* 11.1* 12.3* 11.6*  HGB 8.8* 7.5* 10.5* 10.7* 10.7*  HCT 29.4* 25.1* 34.3* 35.3* 35.9*  MCV 97.4 98.8 96.9 97.5 97.6  PLT 172 176 156 172 478   Basic Metabolic Panel: Recent Labs  Lab 08/15/18 0451 08/16/18 0506 08/17/18 0630 08/18/18 0432 08/19/18 0620  NA 140 141 140 142 141  K 4.0 4.3 3.9 4.3 4.4  CL 102 102 100 101 99  CO2 '30 30 28 ' 32 32  GLUCOSE 101* 119* 152* 109* 104*  BUN '14 13 15 14 15  ' CREATININE 0.61 0.66 0.68 0.88 0.61  CALCIUM 9.1 9.2 9.0 9.3 9.3   GFR: Estimated Creatinine Clearance: 141.8 mL/min (by C-G formula based on SCr of 0.61 mg/dL). Liver Function Tests: No results for input(s): AST, ALT, ALKPHOS,  BILITOT, PROT, ALBUMIN in the last 168 hours. No results for input(s): LIPASE, AMYLASE in the last 168 hours. No results for input(s): AMMONIA in the last 168 hours. Coagulation Profile: No results for input(s): INR, PROTIME in the last 168 hours. Cardiac Enzymes: No results for input(s): CKTOTAL, CKMB, CKMBINDEX, TROPONINI in the last 168 hours. BNP (last 3 results) No results for input(s): PROBNP in the last 8760 hours. HbA1C: No results for input(s): HGBA1C in the last 72 hours. CBG: Recent Labs  Lab 08/18/18 1810 08/18/18 2212 08/19/18 0748 08/19/18 1246 08/19/18 1737  GLUCAP 106* 142* 101* 101* 87   Lipid Profile: No results for input(s): CHOL, HDL, LDLCALC, TRIG, CHOLHDL, LDLDIRECT in the last 72 hours. Thyroid Function Tests: No results for  input(s): TSH, T4TOTAL, FREET4, T3FREE, THYROIDAB in the last 72 hours. Anemia Panel: No results for input(s): VITAMINB12, FOLATE, FERRITIN, TIBC, IRON, RETICCTPCT in the last 72 hours. Sepsis Labs: No results for input(s): PROCALCITON, LATICACIDVEN in the last 168 hours.  Recent Results (from the past 240 hour(s))  Culture, blood (routine x 2)     Status: None   Collection Time: 08/11/18  8:44 AM  Result Value Ref Range Status   Specimen Description BLOOD RIGHT ANTECUBITAL  Final   Special Requests   Final    BOTTLES DRAWN AEROBIC AND ANAEROBIC Blood Culture adequate volume   Culture   Final    NO GROWTH 5 DAYS Performed at [facility], [address] [place], [place] [address]    Report Status 08/16/2018 FINAL  Final  Culture, blood (routine x 2)     Status: None   Collection Time: 08/11/18  8:44 AM  Result Value Ref Range Status   Specimen Description BLOOD RIGHT HAND  Final   Special Requests   Final    BOTTLES DRAWN AEROBIC ONLY Blood Culture adequate volume   Culture   Final    NO GROWTH 5 DAYS Performed at [facility], [place] [address], [place], [place] [address]    Report Status 08/16/2018 FINAL  Final         Radiology Studies: No results found.      Scheduled Meds: . sodium chloride   Intravenous Once  . sodium chloride   Intravenous Once  . apixaban  5 mg Oral BID  . bismuth subsalicylate  30 mL Oral TID AC & HS  . Chlorhexidine Gluconate Cloth  6 each Topical Q0600  . clonazePAM  0.5 mg Oral BID  . colchicine  0.6 mg Oral BID  . collagenase   Topical Daily  . famotidine  20 mg Oral Daily  . feeding supplement (ENSURE ENLIVE)  237 mL Oral TID BM  . furosemide  40 mg Intravenous Once  . furosemide  60 mg Intravenous Q12H  . gabapentin  300 mg Oral TID  . insulin aspart  0-15 Units Subcutaneous TID WC  . insulin aspart  0-5 Units Subcutaneous QHS  . insulin glargine  20 Units Subcutaneous QHS  . magic mouthwash w/lidocaine  5 mL Oral  TID  . multivitamin with minerals  1 tablet Oral Daily  . potassium chloride  40 mEq Oral BID  . sodium chloride flush  10-40 mL Intracatheter Q12H   Continuous Infusions: . sodium chloride 10 mL/hr at 08/15/18 0200     LOS: 73 days    Time spent: 35 minutes    Hosie Poisson, MD [facility] Pager [phone] If 7PM-7AM, please contact night-coverage [email or web address] Password [email or web address]  08/19/2018, 5:40 PM

## 2018-08-19 NOTE — Progress Notes (Signed)
CSW met with patient to follow up regarding need for SSN and CSW assessment. CSW noted patient had concerns as he stated he has tri-care and the only SSN needed was his wife, the primary beneficiary. CSW informed patient he was not from billing and noted patient responded more openly to CSW but did not want to disclose SSN at this time.  CSW noted patient did report verbal consent to contact his father and mother (listed under emergency contacts) and his wife Dawson Bills Dapper [phone]. CSW noted patient did report him and his wife and currently going through divorce.   Lamonte Richer, LCSW, [place] Worker II [phone]

## 2018-08-20 LAB — CBC
HCT: 35.2 % — ABNORMAL LOW (ref 39.0–52.0)
Hemoglobin: 10.8 g/dL — ABNORMAL LOW (ref 13.0–17.0)
MCH: 29.3 pg (ref 26.0–34.0)
MCHC: 30.7 g/dL (ref 30.0–36.0)
MCV: 95.7 fL (ref 80.0–100.0)
Platelets: 190 10*3/uL (ref 150–400)
RBC: 3.68 MIL/uL — ABNORMAL LOW (ref 4.22–5.81)
RDW: 18.4 % — ABNORMAL HIGH (ref 11.5–15.5)
WBC: 11.6 10*3/uL — ABNORMAL HIGH (ref 4.0–10.5)
nRBC: 0.2 % (ref 0.0–0.2)

## 2018-08-20 MED ORDER — COLCHICINE 0.6 MG PO TABS
0.6000 mg | ORAL_TABLET | Freq: Every day | ORAL | Status: DC
  Administered 2018-08-21 – 2018-08-23 (×3): 0.6 mg via ORAL
  Filled 2018-08-20 (×3): qty 1

## 2018-08-20 MED ORDER — INSULIN GLARGINE 100 UNIT/ML ~~LOC~~ SOLN
15.0000 [IU] | Freq: Every day | SUBCUTANEOUS | Status: DC
  Administered 2018-08-20 – 2018-09-10 (×21): 15 [IU] via SUBCUTANEOUS
  Filled 2018-08-20 (×22): qty 0.15

## 2018-08-20 MED ORDER — METHYLPREDNISOLONE SODIUM SUCC 125 MG IJ SOLR
60.0000 mg | Freq: Every day | INTRAMUSCULAR | Status: DC
  Administered 2018-08-20 – 2018-08-22 (×3): 60 mg via INTRAVENOUS
  Filled 2018-08-20 (×3): qty 2

## 2018-08-20 NOTE — Progress Notes (Signed)
PROGRESS NOTE    [name] [name]  MBT:[id number] DOB: Nov 03, 1965 DOA: 06/06/2018 PCP: Helane Rima, MD    Brief Narrative:  52/M with PMH of gout, hypertension, underwent anterior cervical corpectomy of C4 on 5/36/4680 with complicated postoperative course including development of multiple PEs and DVTs started on anticoagulation and subsequently developed C spinal hematoma requiring reexploration on 03/28/2018 status post placement of IVC filter then discharged to SNF -Patient was admitted to this hospital as a transfer from [facility] with MRI of C-spine that showed postoperative changes and cord compression with cord edema and concern for ischemia, s/p post posterior cervical decompression laminectomy of C3, C4, C5, removal of spinous processes, foraminotomies on 06/14/2018.  -On 06/22/2018 developed worsening left sided paresthesias and weakness MRI noted postoperative hematoma, was taken to the OR for evacuation of hematoma.  -Additionally developed Bilateral lower extremity DVTs -on 10/19 was noted to have decreased left hand grip on 10/19, MRI showed large epidural hematoma, patient s/p clot evacuation. He was placed back on anticoagulation argatroban on 10-22. -HIT panel0.486 (mildly elevated, Serotonin release assay was negative. Dr Marin Olp following  -On 10/25; developed weakness of left arm, He was take emergently to OR for recurrence of epidural hematoma on 10-25.He was taken off of anticoagulation.  -10/28 Night: developed left arm weakness, numbness. Stat MRI was ordered, which confirmed recurrent hematoma. Patient was taken to OR for hematoma evacuation 10-28.  -For his thrombocytopenia, Heme Dr.Ennever following, Bone marrow biopsy was done. For LE DVT, he has IVC filter. Patient has received multiple platelet transfusionsand Nplate. Patient underwent CT scan of his chest on 11/13 which showed new pulmonary embolism. Patient was started on bivalirudinafter  discussions between hematology as well as neurosurgery. Platelet counts have improved.  On 11/22 pt was found to have free intraabdominal air, it was followed with CT abd pelvis , shows possible stranding at the greater curvature of the stomach.  Surgery consulted, upper GI series which did not show obstruction, improved, now tolerating diet 11/28 PM noted to have DVT in LUEs on Bivalrudin 11/29 Fever-blood cx drawn, bivalirudin switched over to argatroban 11/30 CCM consulted, s/p R IJ TLC, PICC removed, Blood Cx negative   Assessment & Plan:   Principal Problem:   Recurrent pulmonary embolism ([place]) Active Problems:   Myelopathy ([place])   Pressure injury of skin   Thrombocytopenia ([facility])   Acute blood loss anemia   Benign essential HTN   Cord compression ([facility])   Fever   Spondylosis, cervical, with myelopathy   Labile blood pressure   Orthostatic hypotension   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 Followed with multiple clot evacuation procedures on 10/19, 10/25, 10/28. Repeat MRI on 07/23/2018 shows improvement of the fluid collection. Neurosurgery continues to follow he appears to be stable from their standpoint at this time. He needs aggressive rehabilitation plan for SNF when stable. Patient reported some weakness in the right foot and unable to move it as well as the day before so we ordered an MRI of the cervical spine and discussed with neuro surgery.  Neurosurgery looked at the MRI films report slight improvement when  compared to the films post surgery.  No new  recommendations from neurosurgery.    Acute pulmonary embolism/DVT in the setting of spine surgery S/p IVC filter placement on March 28, 2018 and in addition to that patient has been on multiple anticoagulations which led to recurrent hematomas at the surgical sites requiring further evacuations.  CT chest on November 13 showed new pulmonary embolism with evidence of right heart strain, new acute DVT in the left subclavian axillary, brachial, radial veins and superficial thrombus in the left cephalic and basilic veins on November 28 and he was restarted on anticoagulation.  Currently he is on Eliquis.  Continue to monitor blood counts. Dr. Marin Olp on board.    Pneumoperitoneum incidental on 11/24 General surgery consulted and repeat CT abdomen and pelvis on 1124 showed soft tissue stranding adjacent to the greater curvature of the body of the stomach.  It was followed up with an upper GI series on 11/25 which was negative for leak or perforation. Completed a course of antibiotics and continue with PPI for stress ulcer prophylaxis No abdominal pain or distention. He denies any nausea or vomiting.    Thrombocytopenia/coagulopathy Patient had acute thrombocytopenia earlier in the admission he had extensive work-up for acute thrombocytopenia and and his work-up has been essentially negative.  Over the course of his hospitalization he had received multiple platelet transfusions and his platelet count is currently within normal limits.  Dr. Marin Olp with hematology following.    Fluid overload with upper and lower extremity edema with third spacing Currently on IV Lasix 40 mg twice daily and continue to monitor intake and output.  He continues to be fluid overloaded will increase IV Lasix from 40 mg to 60 mg twice daily and monitor his urine output.  He has diuresed about only 2 liters yesterday.  Continue IV Lasix 60 mg twice daily, monitor electrolytes and renal parameters while on lasix.  Recommend  diuretics as long as blood pressure and kidney function tolerates.    Hyperglycemia probably secondary to the use of steroids earlier in the admission Last hemoglobin A1c was 5.8  CBG (last 3)  Recent Labs    08/19/18 1246 08/19/18 1737 08/19/18 2139  GLUCAP 101* 87 99   We will decrease the low dose of Lantus to 15 units.    Anemia due to acute illness superimposed on anemia of chronic illness Transfuse to keep hemoglobin greater than 8.  Repeat hemoglobin today is 10.5 and stable   Sinus bradycardia TSH within normal limits. It resolved   Hypertension Blood pressure well controlled at this time.   Adjustment disorder with depression and anxiety Plan continue Klonopin as needed.   Stage II pressure ulcer on the right ischial tuberosity not present on admission Wound care consulted and recommendations given.   Left knee pain with swelling and warmth X-rays ordered and on IV morphine as needed, x-rays of the left knee does not show any effusion or acute abnormalities at this time but since patient reports persistent pain even with IV morphine, will get CT of the knee without contrast.  CT of the knee without contrast showed severe osteoarthritis along the medial aspect. Added colchicine for possible gout flareup in his pain appears to be better than yesterday.  Also on oxycodone 20 mg  and use IV morphine for breakthrough pain.      DVT prophylaxis: Eliquis Code Status: Full code Family Communication: None at bedside, discussed the plan with the patient. Disposition Plan: Pending clinical improvement and  plan for SNF when bed is available   Consultants:  Dr. Marin Olp with oncology Cardiology Surgery Dr. Donne Hazel Neurosurgery Dr. Saintclair Halsted  Procedures:On 10/2 1. posterior cervical decompressive laminectomy at C3, C4, C5 with removal of the spinous process and complete laminectomy and foraminotomies of the C3, C4, C5 nerve roots. 2. Posterior cervical fusion with  lateral mass screws at C3-C4-C5 and pars screws at C2 lies in the globus ellipseLateral mass screw system 3. Posterior lateral arthrodesis C2-C5 utilizing locally harvested autograft mixed with vivigen  06/22/2018: Procedure reexploration posterior cervical wound for evacuation of epidural hematoma  06/26/2018 DVT ultrasound:Right: Findings consistent with acute deep vein thrombosis involving the right common femoral vein, right femoral vein, right popliteal vein, right posterior tibial vein, and right peroneal vein. Thrombosis extends proximally into external iliac vein.  Proximal iliac vein not visualized. Left: Findings consistent with acute deep vein thrombosis involving the left common femoral vein, left femoral vein, left popliteal vein, left posterior tibial vein, and left peroneal vein. Findings consistent with acute superficial vein thrombosis  involving the left great saphenous vein. Thrombosis extends proximally into external iliac vein. Proximal iliac vein not visualized.  07/01/18 Clot evacuation   RUE doppler 07/02/18  Antimicrobials: None   Subjective: Patient denies any chest pain or shortness of breath his left knee pain has improved.  We will cut down the dose of colchicine from twice daily to daily.   Objective: Vitals:   08/19/18 2300 08/20/18 0300 08/20/18 0848 08/20/18 1128  BP: 117/73 105/76 126/84 121/86  Pulse: (!) 107 (!) 110 (!) 106 (!) 107  Resp:   (!) 24 10  Temp: 98.3 F (36.8 C) 99 F (37.2 C) 98 F (36.7 C) 98.7 F (37.1 C)  TempSrc: Oral Oral Oral Oral  SpO2: 98% 94% 94% 97%  Weight:      Height:        Intake/Output Summary (Last 24 hours) at 08/20/2018 1156 Last data filed at 08/20/2018 0300 Gross per 24 hour  Intake -  Output 2100 ml  Net -2100 ml   Filed Weights   07/27/18 1502 08/18/18 0409 08/19/18 0500  Weight: 127.6 kg 125.5 kg 125.9 kg    Examination:  General exam: Alert and comfortable laying in bed.  Any kind of  distress Respiratory system: Diminished air entry at bases. Cardiovascular system: S1 & S2 heard, RRR. No JVD, murmurs, Gastrointestinal system: abdomen is soft , nontender, nondistended with good bowel sounds. Central nervous system: Alert and oriented place, person and time.  His lower extremity weakness appears to be the same. Extremities: Bilateral leg edema 2+ , left knee tenderness improving.  Skin: Stage II decubitus ulcer on the right ischium tuberosity Psychiatry:  Mood & affect appropriate.     Data Reviewed: I have personally reviewed following labs and imaging studies  CBC: Recent Labs  Lab 08/16/18 0506 08/17/18 1154 08/18/18 0432 08/19/18 0620 08/20/18 0649  WBC 18.3* 11.1* 12.3* 11.6* 11.6*  HGB 7.5* 10.5* 10.7* 10.7* 10.8*  HCT 25.1* 34.3* 35.3* 35.9* 35.2*  MCV 98.8 96.9 97.5 97.6 95.7  PLT 176 156 172 178 935   Basic Metabolic Panel: Recent Labs  Lab 08/15/18 0451 08/16/18 0506 08/17/18 0630 08/18/18 0432 08/19/18 0620  NA 140 141 140 142 141  K 4.0 4.3 3.9 4.3 4.4  CL 102 102 100 101 99  CO2 _0 32 32  GLUCOSE 101* 119* 152* 109* 104*  BUN _1 CREATININE 0.61 0.66 0.68  0.88 0.61  CALCIUM 9.1 9.2 9.0 9.3 9.3   GFR: Estimated Creatinine Clearance: 141.8 mL/min (by C-G formula based on SCr of 0.61 mg/dL). Liver Function Tests: No results for input(s): AST, ALT, ALKPHOS, BILITOT, PROT, ALBUMIN in the last 168 hours. No results for input(s): LIPASE, AMYLASE in the last 168 hours. No results for input(s): AMMONIA in the last 168 hours. Coagulation Profile: No results for input(s): INR, PROTIME in the last 168 hours. Cardiac Enzymes: No results for input(s): CKTOTAL, CKMB, CKMBINDEX, TROPONINI in the last 168 hours. BNP (last 3 results) No results for input(s): PROBNP in the last 8760 hours. HbA1C: No results for input(s): HGBA1C in the last 72 hours. CBG: Recent Labs  Lab 08/18/18 2212 08/19/18 0748 08/19/18 1246  08/19/18 1737 08/19/18 2139  GLUCAP 142* 101* 101* 87 99   Lipid Profile: No results for input(s): CHOL, HDL, LDLCALC, TRIG, CHOLHDL, LDLDIRECT in the last 72 hours. Thyroid Function Tests: No results for input(s): TSH, T4TOTAL, FREET4, T3FREE, THYROIDAB in the last 72 hours. Anemia Panel: No results for input(s): VITAMINB12, FOLATE, FERRITIN, TIBC, IRON, RETICCTPCT in the last 72 hours. Sepsis Labs: No results for input(s): PROCALCITON, LATICACIDVEN in the last 168 hours.  Recent Results (from the past 240 hour(s))  Culture, blood (routine x 2)     Status: None   Collection Time: 08/11/18  8:44 AM  Result Value Ref Range Status   Specimen Description BLOOD RIGHT ANTECUBITAL  Final   Special Requests   Final    BOTTLES DRAWN AEROBIC AND ANAEROBIC Blood Culture adequate volume   Culture   Final    NO GROWTH 5 DAYS Performed at [facility], [address], [place], [place] [address]    Report Status 08/16/2018 FINAL  Final  Culture, blood (routine x 2)     Status: None   Collection Time: 08/11/18  8:44 AM  Result Value Ref Range Status   Specimen Description BLOOD RIGHT HAND  Final   Special Requests   Final    BOTTLES DRAWN AEROBIC ONLY Blood Culture adequate volume   Culture   Final    NO GROWTH 5 DAYS Performed at [facility], [place] [address]  [place],  [address]    Report Status 08/16/2018 FINAL  Final         Radiology Studies: Mr Cervical Spine Wo Contrast  Result Date: 08/19/2018 CLINICAL DATA:  52 y/o M; new right lower extremity weakness. History of known spinal cord injury for follow-up. EXAM: MRI CERVICAL SPINE WITHOUT CONTRAST TECHNIQUE: Multiplanar, multisequence MR imaging of the cervical spine was performed. No intravenous contrast was administered. COMPARISON:  07/23/2018, 07/10/2018, 07/01/2018, 06/22/2018 MRI of the cervical spine. FINDINGS: Alignment: Straightening of cervical lordosis without listhesis. Vertebrae: No fracture,  evidence of discitis, or bone lesion. C4 corpectomy, C2-C5 posterior fusion, C3-C5 ACDF, and C3-C5 posterior decompression. Stable fluid collection within the laminectomy bed and posterior paraspinal soft tissues. No new compressive effect on the thecal sac. 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 Cord: Increased cord signal at the C4 level, possibly present on the prior cervical MRI (core largely obscured by artifact on the prior study), and decreased in comparison with 10/19 MRIs during the period of cord compression. Posterior Fossa, vertebral arteries, paraspinal tissues: As above. Disc levels: C2-3: Posterior fusion. Stable mild disc bulge and facet hypertrophy. No significant foraminal or canal stenosis. 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 C4-5: Corpectomy, fusion, laminectomy. Residual central ossification of the posterior longitudinal ligament as well as left-greater-than-right uncovertebral and facet hypertrophy. Moderate left foraminal stenosis and mild spinal canal stenosis. No compression of the thecal sac. C5-6: Stable disc osteophyte complex with bilateral uncovertebral and facet hypertrophy. Mild foraminal and spinal canal stenosis C6-7: No significant disc displacement, foraminal stenosis, or canal stenosis. C7-T1: No significant disc displacement, foraminal stenosis, or canal stenosis. IMPRESSION: 1. Increased cord signal at the C4 level. Cord signal is decreased from the 10/19 cervical MRI studies where there is cord compression. Cord is largely obscured by artifact on the prior study, suboptimal comparison. Findings likely represent chronic sequelae of compressive myelopathy. 2. 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 C2-C5  fusion postsurgical changes are stable. No acute osseous abnormality. 3. Stable fluid collection within the C3-C5 laminectomy bed and posterior soft tissues. No compression of thecal sac. 4. Stable cervical spondylosis with left anterior cord impingement at C3-4. No high-grade spinal canal stenosis. Electronically Signed   By: Kristine Garbe M.D.   On: 08/19/2018 20:48        Scheduled Meds: . sodium chloride   Intravenous Once  . sodium chloride   Intravenous Once  . apixaban  5 mg Oral BID  . bismuth subsalicylate  30 mL Oral TID AC & HS  . Chlorhexidine Gluconate Cloth  6 each Topical Q0600  . clonazePAM  0.5 mg Oral BID  . colchicine  0.6 mg Oral BID  . collagenase   Topical Daily  . famotidine  20 mg Oral Daily  . feeding supplement (ENSURE ENLIVE)  237 mL Oral TID BM  . furosemide  40 mg Intravenous Once  . furosemide  60 mg Intravenous Q12H  . gabapentin  300 mg Oral TID  . insulin aspart  0-15 Units Subcutaneous TID WC  . insulin aspart  0-5 Units Subcutaneous QHS  . insulin glargine  18 Units Subcutaneous QHS  . magic mouthwash w/lidocaine  5 mL Oral TID  . multivitamin with minerals  1 tablet Oral Daily  . potassium chloride  40 mEq Oral BID  . sodium chloride flush  10-40 mL Intracatheter Q12H   Continuous Infusions: . sodium chloride 10 mL/hr at 08/15/18 0200     LOS: 74 days    Time spent: 29 minutes    Hosie Poisson, MD [facility] Pager [phone] If 7PM-7AM, please contact night-coverage [email or web address] Password [email or web address] 08/20/2018, 11:56 AM

## 2018-08-21 LAB — GLUCOSE, CAPILLARY
GLUCOSE-CAPILLARY: 173 mg/dL — AB (ref 70–99)
Glucose-Capillary: 164 mg/dL — ABNORMAL HIGH (ref 70–99)
Glucose-Capillary: 228 mg/dL — ABNORMAL HIGH (ref 70–99)

## 2018-08-21 LAB — CBC
HCT: 35.7 % — ABNORMAL LOW (ref 39.0–52.0)
Hemoglobin: 10.9 g/dL — ABNORMAL LOW (ref 13.0–17.0)
MCH: 29 pg (ref 26.0–34.0)
MCHC: 30.5 g/dL (ref 30.0–36.0)
MCV: 94.9 fL (ref 80.0–100.0)
Platelets: 185 10*3/uL (ref 150–400)
RBC: 3.76 MIL/uL — ABNORMAL LOW (ref 4.22–5.81)
RDW: 17.2 % — ABNORMAL HIGH (ref 11.5–15.5)
WBC: 12.1 10*3/uL — ABNORMAL HIGH (ref 4.0–10.5)
nRBC: 0 % (ref 0.0–0.2)

## 2018-08-21 LAB — BASIC METABOLIC PANEL
Anion gap: 11 (ref 5–15)
BUN: 25 mg/dL — ABNORMAL HIGH (ref 6–20)
CO2: 32 mmol/L (ref 22–32)
Calcium: 9.4 mg/dL (ref 8.9–10.3)
Chloride: 98 mmol/L (ref 98–111)
Creatinine, Ser: 0.77 mg/dL (ref 0.61–1.24)
GFR calc Af Amer: >60 mL/min (ref >60)
GFR calc non Af Amer: >60 mL/min (ref >60)
Glucose, Bld: 212 mg/dL — ABNORMAL HIGH (ref 70–99)
Potassium: 4.3 mmol/L (ref 3.5–5.1)
Sodium: 141 mmol/L (ref 135–145)

## 2018-08-21 NOTE — Plan of Care (Signed)

## 2018-08-21 NOTE — Progress Notes (Signed)
PROGRESS NOTE    [name] [name]  MBT:[id number] DOB: Nov 03, 1965 DOA: 06/06/2018 PCP: Helane Rima, MD    Brief Narrative:  52/M with PMH of gout, hypertension, underwent anterior cervical corpectomy of C4 on 5/36/4680 with complicated postoperative course including development of multiple PEs and DVTs started on anticoagulation and subsequently developed C spinal hematoma requiring reexploration on 03/28/2018 status post placement of IVC filter then discharged to SNF -Patient was admitted to this hospital as a transfer from [facility] with MRI of C-spine that showed postoperative changes and cord compression with cord edema and concern for ischemia, s/p post posterior cervical decompression laminectomy of C3, C4, C5, removal of spinous processes, foraminotomies on 06/14/2018.  -On 06/22/2018 developed worsening left sided paresthesias and weakness MRI noted postoperative hematoma, was taken to the OR for evacuation of hematoma.  -Additionally developed Bilateral lower extremity DVTs -on 10/19 was noted to have decreased left hand grip on 10/19, MRI showed large epidural hematoma, patient s/p clot evacuation. He was placed back on anticoagulation argatroban on 10-22. -HIT panel0.486 (mildly elevated, Serotonin release assay was negative. Dr Marin Olp following  -On 10/25; developed weakness of left arm, He was take emergently to OR for recurrence of epidural hematoma on 10-25.He was taken off of anticoagulation.  -10/28 Night: developed left arm weakness, numbness. Stat MRI was ordered, which confirmed recurrent hematoma. Patient was taken to OR for hematoma evacuation 10-28.  -For his thrombocytopenia, Heme Dr.Ennever following, Bone marrow biopsy was done. For LE DVT, he has IVC filter. Patient has received multiple platelet transfusionsand Nplate. Patient underwent CT scan of his chest on 11/13 which showed new pulmonary embolism. Patient was started on bivalirudinafter  discussions between hematology as well as neurosurgery. Platelet counts have improved.  On 11/22 pt was found to have free intraabdominal air, it was followed with CT abd pelvis , shows possible stranding at the greater curvature of the stomach.  Surgery consulted, upper GI series which did not show obstruction, improved, now tolerating diet 11/28 PM noted to have DVT in LUEs on Bivalrudin 11/29 Fever-blood cx drawn, bivalirudin switched over to argatroban 11/30 CCM consulted, s/p R IJ TLC, PICC removed, Blood Cx negative   Assessment & Plan:   Principal Problem:   Recurrent pulmonary embolism ([place]) Active Problems:   Myelopathy ([place])   Pressure injury of skin   Thrombocytopenia ([facility])   Acute blood loss anemia   Benign essential HTN   Cord compression ([facility])   Fever   Spondylosis, cervical, with myelopathy   Labile blood pressure   Orthostatic hypotension   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 Followed with multiple clot evacuation procedures on 10/19, 10/25, 10/28. Repeat MRI on 07/23/2018 shows improvement of the fluid collection. Neurosurgery continues to follow he appears to be stable from their standpoint at this time. He needs aggressive rehabilitation plan for SNF when stable. Patient reported some weakness in the right foot and unable to move it as well as the day before so we ordered an MRI of the cervical spine and discussed with neuro surgery.  Neurosurgery looked at the MRI films report slight improvement when  compared to the films post surgery.  No new  recommendations from neurosurgery.  Physical therapy recommended resting foot splints to help with the pain in the knees when they are externally rotated for so long.    Acute pulmonary embolism/DVT in the setting of spine surgery S/p IVC filter placement on March 28, 2018 and in addition to that patient has been on multiple anticoagulations which led to recurrent hematomas at the surgical sites requiring further evacuations.  CT chest on November 13 showed new pulmonary embolism with evidence of right heart strain, new acute DVT in the left subclavian axillary, brachial, radial veins and superficial thrombus in the left cephalic and basilic veins on November 28 and he was restarted on anticoagulation.  Currently he is on Eliquis.  Continue to monitor blood counts.  His counts appear to be stable so far Dr. Marin Olp on board.    Pneumoperitoneum incidental on 11/24 General surgery consulted and repeat CT abdomen and pelvis on 1124 showed soft tissue stranding adjacent to the greater curvature of the body of the stomach.  It was followed up with an upper GI series on 11/25 which was negative for leak or perforation. Completed a course of antibiotics and continue with PPI for stress ulcer prophylaxis No abdominal pain or distention. He denies any nausea or vomiting.    Thrombocytopenia/coagulopathy Patient had acute thrombocytopenia earlier in the admission he had extensive work-up for acute thrombocytopenia and and his work-up has been essentially negative.  Over the course of his hospitalization he had received multiple platelet transfusions and his platelet count is currently within normal limits.  Dr. Marin Olp with hematology following.  His hemoglobin and platelets have been stable.    Fluid overload with upper and lower extremity edema with third spacing Currently on IV Lasix 40 mg twice daily and continue to monitor intake and output.  He continues to be fluid overloaded will increase IV Lasix  from 40 mg to 60 mg twice daily and monitor his urine output.  He has diuresed about only 2.2 liters yesterday.  Continue IV Lasix 60 mg twice daily, monitor electrolytes and renal parameters while on lasix.  Recommend diuretics as long as blood pressure and kidney function tolerates.    Hyperglycemia probably secondary to the use of steroids earlier in the admission Last hemoglobin A1c was 5.8  CBG (last 3)  Recent Labs    08/19/18 2139 08/21/18 0754 08/21/18 1230  GLUCAP 99 164* 173*   We will decrease the low dose of Lantus to 15 units.  Continue to cover with sliding scale insulin   Anemia due to acute illness superimposed on anemia of chronic illness Transfuse to keep hemoglobin greater than 8.  Repeat hemoglobin today is 10.5 and stable   Sinus bradycardia TSH within normal limits. It resolved   Hypertension Blood pressure well controlled at this time.   Adjustment disorder with depression and anxiety Plan continue with Klonopin as needed.   Stage II pressure ulcer on the right ischial tuberosity not present on admission Wound care consulted and recommendations given.   Left knee pain with swelling and warmth X-rays ordered and on IV morphine as needed, x-rays of the left knee does not show any effusion or acute abnormalities at this time but since patient reports persistent pain even with IV morphine, ordered CT of the knee without contrast.  CT of the knee without contrast showed severe osteoarthritis along the medial aspect. Added colchicine for possible gout flareup.  Also on oxycodone 20  mg  and use IV morphine for breakthrough pain.  Patient reports colchicine and oxycodone are not working so we will add steroids for his gout flareup.  Try to avoid intra-articular steroid injection in view of his Eliquis and oral steroids in view of his recent pneumoperitoneum.    DVT prophylaxis: Eliquis Code Status: Full code Family Communication: None at bedside,  discussed the plan with the patient. Disposition Plan: Pending clinical improvement and plan for SNF when bed is available   Consultants:  Dr. Marin Olp with oncology Cardiology Surgery Dr. Donne Hazel Neurosurgery Dr. Saintclair Halsted  Procedures:On 10/2 1. posterior cervical decompressive laminectomy at C3, C4, C5 with removal of the spinous process and complete laminectomy and foraminotomies of the C3, C4, C5 nerve roots. 2. Posterior cervical fusion with lateral mass screws at C3-C4-C5 and pars screws at C2 lies in the globus ellipseLateral mass screw system 3. Posterior lateral arthrodesis C2-C5 utilizing locally harvested autograft mixed with vivigen  06/22/2018: Procedure reexploration posterior cervical wound for evacuation of epidural hematoma  06/26/2018 DVT ultrasound:Right: Findings consistent with acute deep vein thrombosis involving the right common femoral vein, right femoral vein, right popliteal vein, right posterior tibial vein, and right peroneal vein. Thrombosis extends proximally into external iliac vein.  Proximal iliac vein not visualized. Left: Findings consistent with acute deep vein thrombosis involving the left common femoral vein, left femoral vein, left popliteal vein, left posterior tibial vein, and left peroneal vein. Findings consistent with acute superficial vein thrombosis  involving the left great saphenous vein. Thrombosis extends proximally into external iliac vein. Proximal iliac vein not visualized.  07/01/18 Clot evacuation   RUE doppler 07/02/18  Antimicrobials: None   Subjective: Patient reports since his legs are externally rotated he has severe pain in the left knee.  Therapy recommended resting foot splints which have been ordered.  He denies any chest pain or shortness of breath at this time.  He has occasional dry cough.   Objective: Vitals:   08/20/18 2300 08/21/18 0300 08/21/18 0701 08/21/18 1100  BP: 128/85 (!) 131/92 (!) 150/99 (!) 146/98   Pulse: (!) 108 94  89  Resp:    20  Temp: 98.9 F (37.2 C) 97.6 F (36.4 C) 98.5 F (36.9 C) 97.7 F (36.5 C)  TempSrc: Oral Oral    SpO2: 94% 95% 96% 96%  Weight:      Height:        Intake/Output Summary (Last 24 hours) at 08/21/2018 1333 Last data filed at 08/21/2018 0700 Gross per 24 hour  Intake 250 ml  Output 2200 ml  Net -1950 ml   Filed Weights   07/27/18 1502 08/18/18 0409 08/19/18 0500  Weight: 127.6 kg 125.5 kg 125.9 kg    Examination:  General exam: Alert and not in any kind of distress.  Has a neck collar. Respiratory system: Diminished air entry at bases.  No wheezing or rhonchi Cardiovascular system: S1 & S2 heard, RRR. No JVD, murmurs, Gastrointestinal system: abdomen is soft , nontender, nondistended, bowel sounds are good Central nervous system: Alert and oriented place, person and time.  His lower extremity weakness appears to be the same. Extremities: Bilateral leg edema 2+ , left knee tenderness improving.  Skin: Stage II decubitus ulcer on the right ischium tuberosity Psychiatry:  Mood & affect appropriate.     Data Reviewed: I have personally reviewed following labs and imaging studies  CBC: Recent Labs  Lab 08/17/18 1154 08/18/18 0432 08/19/18 0620 08/20/18 0649 08/21/18 0324  WBC 11.1*  12.3* 11.6* 11.6* 12.1*  HGB 10.5* 10.7* 10.7* 10.8* 10.9*  HCT 34.3* 35.3* 35.9* 35.2* 35.7*  MCV 96.9 97.5 97.6 95.7 94.9  PLT 156 172 178 190 026   Basic Metabolic Panel: Recent Labs  Lab 08/16/18 0506 08/17/18 0630 08/18/18 0432 08/19/18 0620 08/21/18 0324  NA 141 140 142 141 141  K 4.3 3.9 4.3 4.4 4.3  CL 102 100 101 99 98  CO2 30 28 32 32 32  GLUCOSE 119* 152* 109* 104* 212*  BUN '13 15 14 15 '$ 25*  CREATININE 0.66 0.68 0.88 0.61 0.77  CALCIUM 9.2 9.0 9.3 9.3 9.4   GFR: Estimated Creatinine Clearance: 141.8 mL/min (by C-G formula based on SCr of 0.77 mg/dL). Liver Function Tests: No results for input(s): AST, ALT, ALKPHOS, BILITOT,  PROT, ALBUMIN in the last 168 hours. No results for input(s): LIPASE, AMYLASE in the last 168 hours. No results for input(s): AMMONIA in the last 168 hours. Coagulation Profile: No results for input(s): INR, PROTIME in the last 168 hours. Cardiac Enzymes: No results for input(s): CKTOTAL, CKMB, CKMBINDEX, TROPONINI in the last 168 hours. BNP (last 3 results) No results for input(s): PROBNP in the last 8760 hours. HbA1C: No results for input(s): HGBA1C in the last 72 hours. CBG: Recent Labs  Lab 08/19/18 1246 08/19/18 1737 08/19/18 2139 08/21/18 0754 08/21/18 1230  GLUCAP 101* 87 99 164* 173*   Lipid Profile: No results for input(s): CHOL, HDL, LDLCALC, TRIG, CHOLHDL, LDLDIRECT in the last 72 hours. Thyroid Function Tests: No results for input(s): TSH, T4TOTAL, FREET4, T3FREE, THYROIDAB in the last 72 hours. Anemia Panel: No results for input(s): VITAMINB12, FOLATE, FERRITIN, TIBC, IRON, RETICCTPCT in the last 72 hours. Sepsis Labs: No results for input(s): PROCALCITON, LATICACIDVEN in the last 168 hours.  No results found for this or any previous visit (from the past 240 hour(s)).       Radiology Studies: Mr Cervical Spine Wo Contrast  Result Date: 08/19/2018 CLINICAL DATA:  52 y/o M; new right lower extremity weakness. History of known spinal cord injury for follow-up. EXAM: MRI CERVICAL SPINE WITHOUT CONTRAST TECHNIQUE: Multiplanar, multisequence MR imaging of the cervical spine was performed. No intravenous contrast was administered. COMPARISON:  07/23/2018, 07/10/2018, 07/01/2018, 06/22/2018 MRI of the cervical spine. FINDINGS: Alignment: Straightening of cervical lordosis without listhesis. Vertebrae: No fracture, evidence of discitis, or bone lesion. C4 corpectomy, C2-C5 posterior fusion, C3-C5 ACDF, and C3-C5 posterior decompression. Stable fluid collection within the laminectomy bed and posterior paraspinal soft tissues. No new compressive effect on the thecal sac.  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 Cord: Increased cord signal at the C4 level, possibly present on the prior cervical MRI (core largely obscured by artifact on the prior study), and decreased in comparison with 10/19 MRIs during the period of cord compression. Posterior Fossa, vertebral arteries, paraspinal tissues: As above. Disc levels: C2-3: Posterior fusion. Stable mild disc bulge and facet hypertrophy. No significant foraminal or canal stenosis. 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 C4-5: Corpectomy, fusion, laminectomy. Residual central ossification of the posterior longitudinal ligament as well as left-greater-than-right uncovertebral and facet hypertrophy. Moderate left foraminal stenosis and mild spinal canal stenosis. No compression of the thecal sac. C5-6: Stable disc osteophyte complex with bilateral uncovertebral and facet hypertrophy. Mild foraminal and spinal canal stenosis C6-7: No  significant disc displacement, foraminal stenosis, or canal stenosis. C7-T1: No significant disc displacement, foraminal stenosis, or canal stenosis. IMPRESSION: 1. Increased cord signal at the C4 level. Cord signal is decreased from the 10/19 cervical MRI studies where there is cord compression. Cord is largely obscured by artifact on the prior study, suboptimal comparison. Findings likely represent chronic sequelae of compressive myelopathy. 2. 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 C2-C5 fusion postsurgical changes are stable. No acute osseous abnormality. 3. Stable fluid collection within the C3-C5 laminectomy bed and posterior soft tissues. No compression of thecal sac. 4. Stable cervical spondylosis with left anterior cord impingement at C3-4.  No high-grade spinal canal stenosis. Electronically Signed   By: Kristine Garbe M.D.   On: 08/19/2018 20:48        Scheduled Meds: . sodium chloride   Intravenous Once  . sodium chloride   Intravenous Once  . apixaban  5 mg Oral BID  . bismuth subsalicylate  30 mL Oral TID AC & HS  . Chlorhexidine Gluconate Cloth  6 each Topical Q0600  . clonazePAM  0.5 mg Oral BID  . colchicine  0.6 mg Oral Daily  . collagenase   Topical Daily  . famotidine  20 mg Oral Daily  . feeding supplement (ENSURE ENLIVE)  237 mL Oral TID BM  . furosemide  40 mg Intravenous Once  . furosemide  60 mg Intravenous Q12H  . gabapentin  300 mg Oral TID  . insulin aspart  0-15 Units Subcutaneous TID WC  . insulin aspart  0-5 Units Subcutaneous QHS  . insulin glargine  15 Units Subcutaneous QHS  . magic mouthwash w/lidocaine  5 mL Oral TID  . methylPREDNISolone (SOLU-MEDROL) injection  60 mg Intravenous Daily  . multivitamin with minerals  1 tablet Oral Daily  . potassium chloride  40 mEq Oral BID  . sodium chloride flush  10-40 mL Intracatheter Q12H   Continuous Infusions: . sodium chloride 10 mL/hr at 08/15/18 0200     LOS: 75 days    Time spent: 26 minutes    Hosie Poisson, MD [facility] Pager [phone] If 7PM-7AM, please contact night-coverage [email or web address] Password [email or web address] 08/21/2018, 1:33 PM

## 2018-08-21 NOTE — Progress Notes (Signed)
Nutrition Follow-up  DOCUMENTATION CODES:   Morbid obesity  INTERVENTION:  Continue Ensure Enlive po TID, each supplement provides 350 kcal and 20 grams of protein.  Encourage adequate PO intake.   NUTRITION DIAGNOSIS:   Increased nutrient needs related to wound healing, post-op healing as evidenced by estimated needs; ongoing  GOAL:   Patient will meet greater than or equal to 90% of their needs; progressing  MONITOR:   PO intake, Supplement acceptance, Labs, Weight trends, Skin, I & O's  REASON FOR ASSESSMENT:   Low [place]    ASSESSMENT:   [name] [name] is a 52 y.o. male with history of hypertension who has had a recent ACDF at C3 C5 by Dr. Saintclair Halsted in July 1638 complicated by pulmonary embolism was placed on apixaban subsequently discharged to rehab and patient had moved to [place] area to [place] and has been living with his parents.  Had gone to ER around August 27 for complaints of increasing weakness and at the time was found to be febrile and was admitted for possible pneumonia.  He started antibiotic as per the ER physician with whom I discussed.  Patient was eventually discharged to rehab and patient was discharged from rehab last week.  Patient had come to [facility] for possible IVC filter placement for his known DVT and on reaching back home in [place] patient on trying to get out of the car started finding it difficult to walk and his legs gave away.  He also states he inadvertently turned his neck.  Following which patient has been having increasing weakness difficult to walk.  Prior to which patient was able to walk up the stairs.  Denies any incontinence of urine or bowel.  Neck pain has been chronic.  Denies any chest pain or shortness of breath.  9/27- s/p rt knee aspiration and injections 10/2- s/pposterior cervical decompressive laminectomy at C3, C4, C5 with removal of the spinous process and complete laminectomy and foraminotomies of the C3, C4, C5  nerve roots;Posterior cervical fusion with lateral mass screws at C3-C4-C5 and pars screws at C2 lies in the globus ellipseLateral mass screw system;Posterior lateral arthrodesis C2-C5 utilizing locally harvested autograft mixed with vivigen 10/10- s/preexploration posterior cervical wound for evacuation of epidural hematoma 10/19- s/p cervical wound revision with evacuation of cervical epidural hematoma 10/25- s/preexploration of posterior surgical cervical wound for evacuation of epidural hematoma 10/29- s/p exploration of posterior cervical wound and evacuation of epidural hematoma 10/30- s/p bone marrow biopsy 11/22Pt withfree intraabdominal air.CT abd pelvis , shows possible stranding at the greater curvature of the stomach. GI series did not show obstruction.   11/30 s/p R IJ TLC, PICC removed   Pt with L knee pain. Pt continues to work with physical therapy. Plans for SNF upon discharge pending clinical improvement. Pt continues on diuresis due to fluid overload. Pt has been tolerating his diet. Family has been bringing in food from outside/home. Pt currently has Ensure ordered and has been consuming them. RD to continue with current orders to aid in adequate nutrition as well as in wound healing. Labs and medications reviewed.   Diet Order:   Diet Order            Diet regular Room service appropriate? Yes; Fluid consistency: Thin  Diet effective now              EDUCATION NEEDS:   Education needs have been addressed  Skin:  Skin Assessment: Skin Integrity Issues: Skin Integrity Issues:: Stage II,  Stage III Stage II: R ichial tuberosity, sacrum Stage III: L ischial Incisions: N/A  Last BM:  12/8  Height:   Ht Readings from Last 1 Encounters:  06/22/18 '5\' 9"'  (1.753 m)    Weight:   Wt Readings from Last 1 Encounters:  08/19/18 125.9 kg    Ideal Body Weight:  72.7 kg  BMI:  Body mass index is 40.99 kg/m.  Estimated Nutritional Needs:   Kcal:   2100-2300  Protein:  145-160 grams  Fluid:  > 2.1 L    [name] Parker, MS, RD, LDN Pager # [phone] After hours/ weekend pager # [phone]

## 2018-08-21 NOTE — Progress Notes (Signed)
Orthopedic Tech Progress Note Patient Details:  [name] [name] 05-11-1966 [id number]  Ortho Devices Type of Ortho Device: Prafo boot/shoe Ortho Device/Splint Location: bi-lateral Ortho Device/Splint Interventions: Ordered, Application, Adjustment   Post Interventions Patient Tolerated: Well Instructions Provided: Care of device, Adjustment of device   [name], Brittiny Levitz J 08/21/2018, 2:13 PM

## 2018-08-21 NOTE — Progress Notes (Signed)
Physical Therapy Treatment Patient Details Name: [name] [name] MRN: [id number] DOB: Jul 19, 1966 Today's Date: 08/21/2018    History of Present Illness 52 y.o. male admitted on 06/06/18 for bil LE weakness and muscle spasm after multiple falls (with resultant R knee meniscus tear placed in bledsoe brace) following a recent ACDF at C3 in July 2019 (complicated by PE (s/p IVC filter), CIR, and then SNF placement before finally returning home with his parents).  In the ED MRI of c-spine which showed post operative changes and cord edema and possibility of ischemia.  His R knee was aspirated on 06/09/18.  S/p posterior cervical decompression and fusion on 06/14/18. Entire acute course complicated by hypotension in standing (thought to be autonomic) and by finding of multiple LE DVTs (already has an IVC filter in place, but per MDs notes at risk for blocking the filter with these clots).  Pt with post op issues with incisional bleeding s/p 2 evacuations of hematomas and placement of hemovac (06/22/18 and 07/01/18) as well as multiple units of plasma.  Cardiology consulted 07/05/18 due to bradycardia thought to be from his newly started blood thinner.  Pt with another re-accumulation of epidural hematoma with increased L sided weakness and cord compression with emergent evacuation surgery 10/25 & 07/10/18.  Pt also needing more platelets post op.  Bone marrow bx 07/12/18.  Pt found to have submassive PE on 07/27/18 and re-started on anticoagulation. PMHx of HTN, gout, anemia, DDD (lumbar).  08/10/18 found to have a L UE thrombus (same arm as his PICC line).      PT Comments    Pt tolerated treatment EOB well today, seems to be working harder and trying to do as much as he can during our sessions.  Extremity edema seems much improved and pt tolerated ROM well (except left lower leg/gastrocsoleus complex is sore and tight, so asked MD for bil PRAFOS for ankle positioning and for hip positioning (avoid so much ER at  the hips).  Pt remains hopeful for more return and asking questions about when I thought he would stand and start walking again.  I educated him that his likely window of best return is the first 6 months after his last spine surgery, so only time will tell, but I am not too sure he will get much more return than he already has at this point.  I am working on speaking with a WC vendor to see if we can start the process of getting the pt a power chair or if this has to wait until he gets to his post acute rehab destination.  PT will continue to follow acutely for safe mobility progression   Follow Up Recommendations  SNF     Equipment Recommendations  Wheelchair (measurements PT);Wheelchair cushion (measurements PT);Hospital bed(22x20 tilt in space, roho, power tilt, power control, hoyer)    Recommendations for Other Services   NA     Precautions / Restrictions Precautions Precautions: Fall;Cervical;Other (comment) Precaution Comments: Several bed sores on bottom.   Required Braces or Orthoses: Cervical Brace Cervical Brace: Soft collar    Mobility  Bed Mobility Overal bed mobility: Needs Assistance Bed Mobility: Rolling;Sidelying to Sit;Sit to Supine Rolling: +2 for physical assistance;Total assist Sidelying to sit: +2 for physical assistance;Total assist   Sit to supine: +2 for physical assistance;Total assist   General bed mobility comments: Pt continues to need significant assist to mobilize to EOB.  He is able to direct his care, informing therapists to move him to his  left (hips) first before rolling to his right, and position his left arm high across his chest.  The most significant amount of assist is to come to sitting, requiring a rest break on his right elbow, and then repositioning of therapist and tech to come up again to fully upright sitting.  Significant assist needed at trunk and legs to return to supine and we are often at the end of the bed, and would benefit from taking  the baseboard off and positioning a chair to hold his legs when returning to bed.          Balance Overall balance assessment: Needs assistance Sitting-balance support: Bilateral upper extremity supported;Feet supported Sitting balance-Leahy Scale: Zero Sitting balance - Comments: Max assist EOB, pt attempting to assist in positioning his hands in posterior prop today (which he did get them moving), but max assist to get them fully positioned.  Worked EOB for >20 mins on weight shifting from right elbow to midline to left elbow, upright head/neck.  AAROM of head and neck, and scapular retractions/stretching out of his chest.  Pt tolerated this well today.                                     Cognition Arousal/Alertness: Awake/alert Behavior During Therapy: WFL for tasks assessed/performed Overall Cognitive Status: Within Functional Limits for tasks assessed                                 General Comments: Not specifically tested today, conversation normal.      Exercises General Exercises - Upper Extremity Shoulder Flexion: AAROM;PROM;Right;Left;10 reps Elbow Flexion: AAROM;PROM;Right;Left;10 reps Wrist Extension: AAROM;PROM;Right;Left;10 reps General Exercises - Lower Extremity Ankle Circles/Pumps: PROM;Both;10 reps Heel Slides: PROM;Both;10 reps Hip ABduction/ADduction: PROM;Both;10 reps Other Exercises Other Exercises: Massage preformed to bil lower legs and thighs for edema management and pain control, special emphasis on his left lower leg as this gastroc/soleus complex is tight.  Held DF stretch and provided massage simultaneously.  Pt's lower leg edema looks much improved today.   Other Exercises: Hip internal rotation stretch with knee flexed x 10 PROM bil.        Pertinent Vitals/Pain Pain Assessment: Faces Faces Pain Scale: Hurts even more Pain Location: with ROM to left ankle. Pain Descriptors / Indicators: Grimacing;Guarding Pain  Intervention(s): Limited activity within patient's tolerance;Monitored during session;Repositioned           PT Goals (current goals can now be found in the care plan section) Acute Rehab PT Goals Patient Stated Goal: To stand again.  Asking hopeful questions about when his strength would return.  Progress towards PT goals: Progressing toward goals    Frequency    Min 2X/week      PT Plan Current plan remains appropriate       AM-PAC PT "6 Clicks" Mobility   Outcome Measure  Help needed turning from your back to your side while in a flat bed without using bedrails?: Total Help needed moving from lying on your back to sitting on the side of a flat bed without using bedrails?: Total Help needed moving to and from a bed to a chair (including a wheelchair)?: Total Help needed standing up from a chair using your arms (e.g., wheelchair or bedside chair)?: Total Help needed to walk in hospital room?: Total Help needed climbing 3-5 steps  with a railing? : Total 6 Click Score: 6    End of Session Equipment Utilized During Treatment: Cervical collar(soft collar) Activity Tolerance: Patient tolerated treatment well Patient left: in bed;with call bell/phone within reach;Other (comment)(on his right side as he was on his left when I came in.) Nurse Communication: Mobility status PT Visit Diagnosis: Other abnormalities of gait and mobility (R26.89);Other symptoms and signs involving the nervous system (R29.898);Muscle weakness (generalized) (M62.81)     Time: 0981-1914 PT Time Calculation (min) (ACUTE ONLY): 75 min  Charges:  $Therapeutic Exercise: 23-37 mins $Therapeutic Activity: 23-37 mins $Massage: 8-22 mins                    Bobi Daudelin B. Munachimso Rigdon, PT, DPT  Acute Rehabilitation [phone] pager #[phone] office   08/21/2018, 1:41 PM

## 2018-08-22 LAB — GLUCOSE, CAPILLARY
GLUCOSE-CAPILLARY: 189 mg/dL — AB (ref 70–99)
Glucose-Capillary: 106 mg/dL — ABNORMAL HIGH (ref 70–99)
Glucose-Capillary: 127 mg/dL — ABNORMAL HIGH (ref 70–99)
Glucose-Capillary: 130 mg/dL — ABNORMAL HIGH (ref 70–99)
Glucose-Capillary: 173 mg/dL — ABNORMAL HIGH (ref 70–99)
Glucose-Capillary: 233 mg/dL — ABNORMAL HIGH (ref 70–99)
Glucose-Capillary: 153 mg/dL — ABNORMAL HIGH (ref 70–99)
Glucose-Capillary: 179 mg/dL — ABNORMAL HIGH (ref 70–99)
Glucose-Capillary: 202 mg/dL — ABNORMAL HIGH (ref 70–99)

## 2018-08-22 LAB — CBC
HCT: 36.7 % — ABNORMAL LOW (ref 39.0–52.0)
Hemoglobin: 10.7 g/dL — ABNORMAL LOW (ref 13.0–17.0)
MCH: 28.3 pg (ref 26.0–34.0)
MCHC: 29.2 g/dL — ABNORMAL LOW (ref 30.0–36.0)
MCV: 97.1 fL (ref 80.0–100.0)
Platelets: 208 10*3/uL (ref 150–400)
RBC: 3.78 MIL/uL — ABNORMAL LOW (ref 4.22–5.81)
RDW: 17.2 % — ABNORMAL HIGH (ref 11.5–15.5)
WBC: 16 10*3/uL — ABNORMAL HIGH (ref 4.0–10.5)
nRBC: 0 % (ref 0.0–0.2)

## 2018-08-22 NOTE — Progress Notes (Signed)
PROGRESS NOTE    [name] [name]  WOE:[id number] DOB: 1966-03-31 DOA: 06/06/2018 PCP: Helane Rima, MD    Brief Narrative:  52/M with PMH of gout, hypertension, underwent anterior cervical corpectomy of C4 on 0/11/7046 with complicated postoperative course including development of multiple PEs and DVTs started on anticoagulation and subsequently developed C spinal hematoma requiring reexploration on 03/28/2018 status post placement of IVC filter then discharged to SNF -Patient was admitted to this hospital as a transfer from [facility] with MRI of C-spine that showed postoperative changes and cord compression with cord edema and concern for ischemia, s/p post posterior cervical decompression laminectomy of C3, C4, C5, removal of spinous processes, foraminotomies on 06/14/2018.  -On 06/22/2018 developed worsening left sided paresthesias and weakness MRI noted postoperative hematoma, was taken to the OR for evacuation of hematoma.  -Additionally developed Bilateral lower extremity DVTs -on 10/19 was noted to have decreased left hand grip on 10/19, MRI showed large epidural hematoma, patient s/p clot evacuation. He was placed back on anticoagulation argatroban on 10-22. -HIT panel0.486 (mildly elevated, Serotonin release assay was negative. Dr Marin Olp following  -On 10/25; developed weakness of left arm, He was take emergently to OR for recurrence of epidural hematoma on 10-25.He was taken off of anticoagulation.  -10/28 Night: developed left arm weakness, numbness. Stat MRI was ordered, which confirmed recurrent hematoma. Patient was taken to OR for hematoma evacuation 10-28.  -For his thrombocytopenia, Heme Dr.Ennever following, Bone marrow biopsy was done. For LE DVT, he has IVC filter. Patient has received multiple platelet transfusionsand Nplate. Patient underwent CT scan of his chest on 11/13 which showed new pulmonary embolism. Patient was started on bivalirudinafter  discussions between hematology as well as neurosurgery. Platelet counts have improved.  On 11/22 pt was found to have free intraabdominal air, it was followed with CT abd pelvis , shows possible stranding at the greater curvature of the stomach.  Surgery consulted, upper GI series which did not show obstruction, improved, now tolerating diet 11/28 PM noted to have DVT in LUEs on Bivalrudin 11/29 Fever-blood cx drawn, bivalirudin switched over to argatroban 11/30 CCM consulted, s/p R IJ TLC, PICC removed, Blood Cx negative  12/4 till 12/10 : pt had gout flare up and was started on colchicine and a repeat MRI of the cervical done for worsening weakness in the right lower extremity.  NS over the weekend looked at the films and recommended no further work up .    Assessment & Plan:   Principal Problem:   Recurrent pulmonary embolism (HCC) Active Problems:   Myelopathy (HCC)   Pressure injury of skin   Thrombocytopenia (HCC)   Acute blood loss anemia   Benign essential HTN   Cord compression (HCC)   Fever   Spondylosis, cervical, with myelopathy   Labile blood pressure   Orthostatic hypotension   Tachycardia-bradycardia syndrome (HCC)   Adjustment disorder with mixed anxiety and depressed mood   G6PD deficiency   G6PD deficiency anemia (HCC)   Ascending aortic aneurysm (HCC)   Difficult intravenous access   DVT (deep venous thrombosis) (HCC)   Cervical spinal canal stenosis with lower extremity weakness Status post cervical decompressive surgery on 06/14/2018 Status post evacuation of large hematoma and placement of Hemovac drain on 06/22/2018. Followed with multiple clot evacuation procedures on 10/19, 10/25, 10/28. Repeat MRI on 07/23/2018 shows improvement of the fluid collection. Neurosurgery continues to follow he appears to be stable from their standpoint at this time. He needs aggressive rehabilitation plan  for SNF when stable. Patient reported some weakness in the right  foot and unable to move it as well as the day before so we ordered an MRI of the cervical spine and discussed with neuro surgery.  Neurosurgery looked at the MRI films report slight improvement when compared to the films post surgery.  No new recommendations from neurosurgery.  Physical therapy recommended resting foot splints to help with the pain in the knees when they are externally rotated for so long. Patient's knee pain appears to have resolved now and his right foot strength improving.   We are currently awaiting SNF placement.     Acute pulmonary embolism/DVT in the setting of spine surgery S/p IVC filter placement on March 28, 2018 and in addition to that patient has been on multiple anticoagulations which led to recurrent hematomas at the surgical sites requiring further evacuations.  CT chest on November 13 showed new pulmonary embolism with evidence of right heart strain, new acute DVT in the left subclavian axillary, brachial, radial veins and superficial thrombus in the left cephalic and basilic veins on November 28 and he was restarted on anticoagulation.  Currently he is on Eliquis.  Continue to monitor blood counts.  His counts appear to be stable so far Dr. Marin Olp on board.    Pneumoperitoneum incidental on 11/24 General surgery consulted and repeat CT abdomen and pelvis on 1124 showed soft tissue stranding adjacent to the greater curvature of the body of the stomach.  It was followed up with an upper GI series on 11/25 which was negative for leak or perforation. Completed a course of antibiotics and continue with PPI for stress ulcer prophylaxis No abdominal pain or distention. He denies any nausea or vomiting.    Thrombocytopenia/coagulopathy Patient had acute thrombocytopenia earlier in the admission he had extensive work-up for acute thrombocytopenia and and his work-up has been essentially negative.  Over the course of his hospitalization he had received multiple platelet  transfusions and his platelet count is currently within normal limits.  Dr. Marin Olp with hematology following.  His hemoglobin and platelets have been stable.    Fluid overload with upper and lower extremity edema with third spacing Currently on IV Lasix 40 mg twice daily and continue to monitor intake and output.  He continues to be fluid overloaded will increase IV Lasix from 40 mg to 60 mg twice daily and monitor his urine output.  He has diuresed about only 1.9 liters yesterday.  Continue IV Lasix 60 mg twice daily, monitor electrolytes and renal parameters while on lasix.  Recommend diuretics as long as blood pressure and kidney function tolerates. He has diuresed about 13 liters since admission.     Hyperglycemia probably secondary to the use of steroids earlier in the admission Last hemoglobin A1c was 5.8  CBG (last 3)  Recent Labs    08/21/18 2152 08/22/18 0730 08/22/18 1131  GLUCAP 233* 189* 153*   Since his  cbgs are better, we will increase the lantus to 18 units qhs.   Continue to cover with sliding scale insulin   Anemia due to acute illness superimposed on anemia of chronic illness Transfuse to keep hemoglobin greater than 8.  Repeat hemoglobin today is 10.7 and stable   Sinus bradycardia TSH within normal limits. It resolved   Hypertension Blood pressure well controlled at this time.   Adjustment disorder with depression and anxiety Plan continue with Klonopin as needed.   Stage II pressure ulcer on the right ischial tuberosity  not present on admission Wound care consulted and recommendations given.   Left knee pain with swelling and warmth X-rays ordered and on IV morphine as needed, x-rays of the left knee does not show any effusion or acute abnormalities at this time but since patient reports persistent pain even with IV morphine, ordered CT of the knee without contrast.  CT of the knee without contrast showed severe osteoarthritis along the medial  aspect. Added colchicine for possible gout flareup.  Also on oxycodone 20 mg  and use IV morphine for breakthrough pain.    Try to avoid intra-articular steroid injection in view of his Eliquis and oral steroids in view of his recent pneumoperitoneum. pts left knee pain appears to have resolved at this time.     DVT prophylaxis: Eliquis Code Status: Full code Family Communication: None at bedside, discussed the plan with the patient. Disposition Plan:  plan for SNF when bed is available   Consultants:  Dr. Marin Olp with oncology Cardiology Surgery Dr. Donne Hazel Neurosurgery Dr. Saintclair Halsted  Procedures:On 10/2 1. posterior cervical decompressive laminectomy at C3, C4, C5 with removal of the spinous process and complete laminectomy and foraminotomies of the C3, C4, C5 nerve roots. 2. Posterior cervical fusion with lateral mass screws at C3-C4-C5 and pars screws at C2 lies in the globus ellipseLateral mass screw system 3. Posterior lateral arthrodesis C2-C5 utilizing locally harvested autograft mixed with vivigen  06/22/2018: Procedure reexploration posterior cervical wound for evacuation of epidural hematoma  06/26/2018 DVT ultrasound:Right: Findings consistent with acute deep vein thrombosis involving the right common femoral vein, right femoral vein, right popliteal vein, right posterior tibial vein, and right peroneal vein. Thrombosis extends proximally into external iliac vein.  Proximal iliac vein not visualized. Left: Findings consistent with acute deep vein thrombosis involving the left common femoral vein, left femoral vein, left popliteal vein, left posterior tibial vein, and left peroneal vein. Findings consistent with acute superficial vein thrombosis  involving the left great saphenous vein. Thrombosis extends proximally into external iliac vein. Proximal iliac vein not visualized.  07/01/18 Clot evacuation   RUE doppler 07/02/18  Antimicrobials: None   Subjective: The  resting foot splints have been placed. And his left pain is resolved. He is in good spirits. And looking forward to being discharged to SNF.    Objective: Vitals:   08/22/18 0400 08/22/18 0500 08/22/18 0700 08/22/18 1100  BP: (!) 134/96  (!) 154/92 (!) 159/100  Pulse: 93  93 89  Resp:   16 16  Temp: 97.9 F (36.6 C)  98.4 F (36.9 C) 97.9 F (36.6 C)  TempSrc: Oral  Oral Oral  SpO2: 94%  96% 97%  Weight:  127.8 kg    Height:        Intake/Output Summary (Last 24 hours) at 08/22/2018 1345 Last data filed at 08/22/2018 1100 Gross per 24 hour  Intake 840 ml  Output 1901 ml  Net -1061 ml   Filed Weights   08/18/18 0409 08/19/18 0500 08/22/18 0500  Weight: 125.5 kg 125.9 kg 127.8 kg    Examination:  General exam: Alert and not in any kind of distress. He appears to be in good spirits.  Has a neck collar. Respiratory system:  Clear to auscultation bilaterally.  No wheezing or rhonchi Cardiovascular system: S1 & S2 heard, RRR. No JVD, murmurs, Gastrointestinal system: abdomen is soft , NT nd bs+ Central nervous system: Alert and oriented place, person and time.  His lower extremity weakness appears to be the same.  Extremities: Bilateral leg edema 2+ ,  Skin: Stage II decubitus ulcer on the right ischium tuberosity Psychiatry:  Mood & affect appropriate.     Data Reviewed: I have personally reviewed following labs and imaging studies  CBC: Recent Labs  Lab 08/18/18 0432 08/19/18 0620 08/20/18 0649 08/21/18 0324 08/22/18 0930  WBC 12.3* 11.6* 11.6* 12.1* 16.0*  HGB 10.7* 10.7* 10.8* 10.9* 10.7*  HCT 35.3* 35.9* 35.2* 35.7* 36.7*  MCV 97.5 97.6 95.7 94.9 97.1  PLT 172 178 190 185 220   Basic Metabolic Panel: Recent Labs  Lab 08/16/18 0506 08/17/18 0630 08/18/18 0432 08/19/18 0620 08/21/18 0324  NA 141 140 142 141 141  K 4.3 3.9 4.3 4.4 4.3  CL 102 100 101 99 98  CO2 30 28 32 32 32  GLUCOSE 119* 152* 109* 104* 212*  BUN '13 15 14 15 ' 25*  CREATININE 0.66  0.68 0.88 0.61 0.77  CALCIUM 9.2 9.0 9.3 9.3 9.4   GFR: Estimated Creatinine Clearance: 142.8 mL/min (by C-G formula based on SCr of 0.77 mg/dL). Liver Function Tests: No results for input(s): AST, ALT, ALKPHOS, BILITOT, PROT, ALBUMIN in the last 168 hours. No results for input(s): LIPASE, AMYLASE in the last 168 hours. No results for input(s): AMMONIA in the last 168 hours. Coagulation Profile: No results for input(s): INR, PROTIME in the last 168 hours. Cardiac Enzymes: No results for input(s): CKTOTAL, CKMB, CKMBINDEX, TROPONINI in the last 168 hours. BNP (last 3 results) No results for input(s): PROBNP in the last 8760 hours. HbA1C: No results for input(s): HGBA1C in the last 72 hours. CBG: Recent Labs  Lab 08/21/18 1230 08/21/18 1542 08/21/18 2152 08/22/18 0730 08/22/18 1131  GLUCAP 173* 228* 233* 189* 153*   Lipid Profile: No results for input(s): CHOL, HDL, LDLCALC, TRIG, CHOLHDL, LDLDIRECT in the last 72 hours. Thyroid Function Tests: No results for input(s): TSH, T4TOTAL, FREET4, T3FREE, THYROIDAB in the last 72 hours. Anemia Panel: No results for input(s): VITAMINB12, FOLATE, FERRITIN, TIBC, IRON, RETICCTPCT in the last 72 hours. Sepsis Labs: No results for input(s): PROCALCITON, LATICACIDVEN in the last 168 hours.  No results found for this or any previous visit (from the past 240 hour(s)).       Radiology Studies: No results found.      Scheduled Meds: . sodium chloride   Intravenous Once  . sodium chloride   Intravenous Once  . apixaban  5 mg Oral BID  . bismuth subsalicylate  30 mL Oral TID AC & HS  . Chlorhexidine Gluconate Cloth  6 each Topical Q0600  . clonazePAM  0.5 mg Oral BID  . colchicine  0.6 mg Oral Daily  . collagenase   Topical Daily  . famotidine  20 mg Oral Daily  . feeding supplement (ENSURE ENLIVE)  237 mL Oral TID BM  . furosemide  40 mg Intravenous Once  . furosemide  60 mg Intravenous Q12H  . gabapentin  300 mg Oral TID    . insulin aspart  0-15 Units Subcutaneous TID WC  . insulin aspart  0-5 Units Subcutaneous QHS  . insulin glargine  15 Units Subcutaneous QHS  . magic mouthwash w/lidocaine  5 mL Oral TID  . methylPREDNISolone (SOLU-MEDROL) injection  60 mg Intravenous Daily  . multivitamin with minerals  1 tablet Oral Daily  . potassium chloride  40 mEq Oral BID  . sodium chloride flush  10-40 mL Intracatheter Q12H   Continuous Infusions: . sodium chloride 10 mL/hr at 08/15/18 0200  LOS: 76 days    Time spent: 28 minutes    Hosie Poisson, MD [facility] Pager [phone] If 7PM-7AM, please contact night-coverage [email or web address] Password [email or web address] 08/22/2018, 1:45 PM

## 2018-08-22 NOTE — Progress Notes (Signed)
Occupational Therapy Treatment Patient Details Name: [name] [name] MRN: [id number] DOB: [birth year] Today's Date: 08/22/2018    History of present illness 52 y.o. male admitted on 06/06/18 for bil LE weakness and muscle spasm after multiple falls (with resultant R knee meniscus tear placed in bledsoe brace) following a recent ACDF at C3 in July 2019 (complicated by PE (s/p IVC filter), CIR, and then SNF placement before finally returning home with his parents).  In the ED MRI of c-spine which showed post operative changes and cord edema and possibility of ischemia.  His R knee was aspirated on 06/09/18.  S/p posterior cervical decompression and fusion on 06/14/18. Entire acute course complicated by hypotension in standing (thought to be autonomic) and by finding of multiple LE DVTs (already has an IVC filter in place, but per MDs notes at risk for blocking the filter with these clots).  Pt with post op issues with incisional bleeding s/p 2 evacuations of hematomas and placement of hemovac (06/22/18 and 07/01/18) as well as multiple units of plasma.  Cardiology consulted 07/05/18 due to bradycardia thought to be from his newly started blood thinner.  Pt with another re-accumulation of epidural hematoma with increased L sided weakness and cord compression with emergent evacuation surgery 10/25 & 07/10/18.  Pt also needing more platelets post op.  Bone marrow bx 07/12/18.  Pt found to have submassive PE on 07/27/18 and re-started on anticoagulation. PMHx of HTN, gout, anemia, DDD (lumbar).  08/10/18 found to have a L UE thrombus (same arm as his PICC line).     OT comments  Pt pleasant and willing to participate in therapy this session. Initial focus on PROM, edema management to LEs using massage and stretching PRN. Pt also engaging in AAROM to LUE with trace movements noted in shoulder flexion and with elbow flexion with use of gravity eliminated AAROM. Continued education on RUE fine motor exercises to  increase ease of using RUE for functional tasks with pt return demonstrating throughout. Pt repositioned at bed level end of session. Continue per POC.    Follow Up Recommendations  SNF;Supervision/Assistance - 24 hour    Equipment Recommendations  Other (comment)(defer to next venue`)          Precautions / Restrictions Precautions Precautions: Fall;Cervical;Other (comment) Precaution Comments: Several bed sores on bottom.   Required Braces or Orthoses: Cervical Brace Cervical Brace: Soft collar Restrictions Weight Bearing Restrictions: No       Mobility Bed Mobility               General bed mobility comments: repositioned in bed to promote midline position as pt with L lateral lean today, pt able to assit with directing care in repositioning                                                                   ADL either performed or assessed with clinical judgement   ADL                                         General ADL Comments: continued focus on UE AAROM and LE PROM, edema management; significant improvements noted in LUE edema  from previous OT session     Vision       Perception     Praxis      Cognition Arousal/Alertness: Awake/alert Behavior During Therapy: WFL for tasks assessed/performed Overall Cognitive Status: Within Functional Limits for tasks assessed                                          Exercises Exercises: Other exercises;General Upper Extremity;General Lower Extremity General Exercises - Upper Extremity Shoulder Flexion: AAROM;PROM;Left;10 reps;Supine Elbow Flexion: AAROM;PROM;Left;10 reps;Supine Elbow Extension: PROM;AAROM;Left;10 reps;Supine Digit Composite Flexion: AROM;10 reps;Left;Supine;Right Composite Extension: AROM;Both;10 reps;Supine General Exercises - Lower Extremity Ankle Circles/Pumps: PROM;Both;10 reps Heel Slides: PROM;Both;10 reps Other  Exercises Other Exercises: Massage preformed to bil lower legs and thighs for edema management and pain control; improvements noted in edema in bil LEs Other Exercises: finger opposition to R digits   Shoulder Instructions       General Comments      Pertinent Vitals/ Pain       Pain Assessment: Faces Faces Pain Scale: Hurts little more Pain Location: with ROM to left ankle. Pain Descriptors / Indicators: Grimacing;Guarding Pain Intervention(s): Monitored during session;Repositioned  Home Living                                          Prior Functioning/Environment              Frequency  Min 2X/week        Progress Toward Goals  OT Goals(current goals can now be found in the care plan section)  Progress towards OT goals: Progressing toward goals  Acute Rehab OT Goals Patient Stated Goal: pt optimistic about current progress OT Goal Formulation: With patient Time For Goal Achievement: 08/22/18 Potential to Achieve Goals: Fair  Plan Discharge plan remains appropriate    Co-evaluation                 AM-PAC OT "6 Clicks" Daily Activity     Outcome Measure   Help from another person eating meals?: A Little Help from another person taking care of personal grooming?: A Little Help from another person toileting, which includes using toliet, bedpan, or urinal?: Total Help from another person bathing (including washing, rinsing, drying)?: A Lot Help from another person to put on and taking off regular upper body clothing?: Total Help from another person to put on and taking off regular lower body clothing?: Total 6 Click Score: 11    End of Session Equipment Utilized During Treatment: Cervical collar  OT Visit Diagnosis: Other abnormalities of gait and mobility (R26.89);Muscle weakness (generalized) (M62.81)   Activity Tolerance Patient tolerated treatment well   Patient Left in bed;with call bell/phone within reach;with  nursing/sitter in room;with family/visitor present   Nurse Communication Mobility status        Time: 1445-1533 OT Time Calculation (min): 48 min  Charges: OT General Charges $OT Visit: 1 Visit OT Treatments $Therapeutic Activity: 38-52 mins  Marcy Siren, OT [facility] Services Pager [phone] Office [phone]   [name] [name] 08/22/2018, 5:00 PM

## 2018-08-22 NOTE — Plan of Care (Signed)
  Problem: Education: Goal: Knowledge of General Education information will improve Description Including pain rating scale, medication(s)/side effects and non-pharmacologic comfort measures 08/22/2018 1136 by Elba BarmanFrye, Lovey Crupi S, RN Outcome: Progressing 08/22/2018 1122 by Elba BarmanFrye, Lesieli Bresee S, RN Outcome: Progressing   Problem: Health Behavior/Discharge Planning: Goal: Ability to manage health-related needs will improve 08/22/2018 1136 by Elba BarmanFrye, Corita Allinson S, RN Outcome: Progressing 08/22/2018 1122 by Elba BarmanFrye, Tyrone Balash S, RN Outcome: Progressing   Problem: Clinical Measurements: Goal: Ability to maintain clinical measurements within normal limits will improve 08/22/2018 1136 by Elba BarmanFrye, Mariska Daffin S, RN Outcome: Progressing 08/22/2018 1122 by Elba BarmanFrye, Camden Mazzaferro S, RN Outcome: Progressing Goal: Will remain free from infection 08/22/2018 1136 by Elba BarmanFrye, Paden Kuras S, RN Outcome: Progressing 08/22/2018 1122 by Elba BarmanFrye, Malakhai Beitler S, RN Outcome: Progressing Goal: Diagnostic test results will improve 08/22/2018 1136 by Elba BarmanFrye, Merci Walthers S, RN Outcome: Progressing 08/22/2018 1122 by Elba BarmanFrye, Shaneisha Burkel S, RN Outcome: Progressing Goal: Respiratory complications will improve 08/22/2018 1136 by Elba BarmanFrye, Janelle Spellman S, RN Outcome: Progressing 08/22/2018 1122 by Elba BarmanFrye, Karlin Heilman S, RN Outcome: Progressing Goal: Cardiovascular complication will be avoided 08/22/2018 1136 by Elba BarmanFrye, Eyla Tallon S, RN Outcome: Progressing 08/22/2018 1122 by Elba BarmanFrye, Zayyan Mullen S, RN Outcome: Progressing   Problem: Activity: Goal: Risk for activity intolerance will decrease 08/22/2018 1136 by Elba BarmanFrye, Aoi Kouns S, RN Outcome: Progressing 08/22/2018 1122 by Elba BarmanFrye, Akeiba Axelson S, RN Outcome: Progressing   Problem: Nutrition: Goal: Adequate nutrition will be maintained 08/22/2018 1136 by Elba BarmanFrye, Ramandeep Arington S, RN Outcome: Progressing 08/22/2018 1122 by Elba BarmanFrye, Syncere Kaminski S, RN Outcome: Progressing   Problem: Elimination: Goal: Will not experience complications related to bowel motility 08/22/2018  1136 by Elba BarmanFrye, Lourene Hoston S, RN Outcome: Progressing 08/22/2018 1122 by Elba BarmanFrye, Tajah Noguchi S, RN Outcome: Progressing Goal: Will not experience complications related to urinary retention 08/22/2018 1136 by Elba BarmanFrye, Sibel Khurana S, RN Outcome: Progressing 08/22/2018 1122 by Elba BarmanFrye, Roselia Snipe S, RN Outcome: Progressing   Problem: Pain Managment: Goal: General experience of comfort will improve 08/22/2018 1136 by Elba BarmanFrye, Jannet Calip S, RN Outcome: Progressing 08/22/2018 1122 by Elba BarmanFrye, Keilyn Nadal S, RN Outcome: Progressing   Problem: Safety: Goal: Ability to remain free from injury will improve 08/22/2018 1136 by Elba BarmanFrye, Kerisha Goughnour S, RN Outcome: Progressing 08/22/2018 1122 by Elba BarmanFrye, Nidia Grogan S, RN Outcome: Progressing   Problem: Skin Integrity: Goal: Risk for impaired skin integrity will decrease 08/22/2018 1136 by Elba BarmanFrye, Miguelangel Korn S, RN Outcome: Progressing 08/22/2018 1122 by Elba BarmanFrye, Matisyn Cabeza S, RN Outcome: Progressing

## 2018-08-22 NOTE — Plan of Care (Signed)

## 2018-08-23 DIAGNOSIS — R202 Paresthesia of skin: Secondary | ICD-10-CM

## 2018-08-23 DIAGNOSIS — R200 Anesthesia of skin: Secondary | ICD-10-CM

## 2018-08-23 DIAGNOSIS — R609 Edema, unspecified: Secondary | ICD-10-CM

## 2018-08-23 DIAGNOSIS — G259 Extrapyramidal and movement disorder, unspecified: Secondary | ICD-10-CM

## 2018-08-23 DIAGNOSIS — E46 Unspecified protein-calorie malnutrition: Secondary | ICD-10-CM

## 2018-08-23 LAB — CBC
HCT: 38.7 % — ABNORMAL LOW (ref 39.0–52.0)
Hemoglobin: 11.3 g/dL — ABNORMAL LOW (ref 13.0–17.0)
MCH: 28.6 pg (ref 26.0–34.0)
MCHC: 29.2 g/dL — ABNORMAL LOW (ref 30.0–36.0)
MCV: 98 fL (ref 80.0–100.0)
Platelets: 223 10*3/uL (ref 150–400)
RBC: 3.95 MIL/uL — AB (ref 4.22–5.81)
RDW: 17.2 % — AB (ref 11.5–15.5)
WBC: 16.1 10*3/uL — ABNORMAL HIGH (ref 4.0–10.5)
nRBC: 0.2 % (ref 0.0–0.2)

## 2018-08-23 LAB — GLUCOSE, CAPILLARY
GLUCOSE-CAPILLARY: 112 mg/dL — AB (ref 70–99)
Glucose-Capillary: 111 mg/dL — ABNORMAL HIGH (ref 70–99)
Glucose-Capillary: 114 mg/dL — ABNORMAL HIGH (ref 70–99)
Glucose-Capillary: 111 mg/dL — ABNORMAL HIGH (ref 70–99)

## 2018-08-23 LAB — BASIC METABOLIC PANEL
Anion gap: 12 (ref 5–15)
BUN: 44 mg/dL — ABNORMAL HIGH (ref 6–20)
CO2: 30 mmol/L (ref 22–32)
Calcium: 9.9 mg/dL (ref 8.9–10.3)
Chloride: 100 mmol/L (ref 98–111)
Creatinine, Ser: 0.69 mg/dL (ref 0.61–1.24)
GFR calc Af Amer: >60 mL/min (ref >60)
GFR calc non Af Amer: >60 mL/min (ref >60)
Glucose, Bld: 144 mg/dL — ABNORMAL HIGH (ref 70–99)
Potassium: 4.3 mmol/L (ref 3.5–5.1)
SODIUM: 142 mmol/L (ref 135–145)

## 2018-08-23 MED ORDER — HYDRALAZINE HCL 20 MG/ML IJ SOLN: 5.0000 mg | Freq: Four times a day (QID) | INTRAMUSCULAR | Status: DC | PRN

## 2018-08-23 NOTE — Progress Notes (Addendum)
PROGRESS NOTE  [name] [name] ZOX:[id number] DOB: 1965-11-15 DOA: 06/06/2018 PCP: Helane Rima, MD  HPI/Brief Narrative  [name] [name] is a 52 y.o. year old male with medical history significant for gout, hypertension, underwent anterior cervical corpectomy of C4 on 04/23/9146 with complicated postoperative course including development of multiple PEs and DVTs started on anticoagulation and subsequently developed C spinal hematoma requiring reexploration on 03/28/2018 status post placement of IVC filter then discharged to SNF who presented on 06/06/2018 is a transfer from [facility] with cervical spine MRI showing changes consistent with cord compression, cord edema and concern for ischemia.  His hospital course has been complicated by cervical decompression laminectomy, postoperative hematomas and multiple evacuations, new bilateral lower extremity DVTs, epidural hematoma (82/95, 62/13) complicating anticoagulation therapy (briefly on her gastric band and discontinued given recurrent hematomas), new PE, bone marrow biopsy for evaluation of thrombocytopenia, platelet and endplate transfusions.  Most recently she had repeat imaging of the cervical spine (MRI which neurosurgery evaluated and deemed it looked stable and recommended no further work-up  Subjective Expresses frustration with prolonged hospital course No new complaints otherwise  Assessment/Plan:  Cervical spine stenosis, severe.  Status post decompressive surgery complicated by multiple postoperative hematomas and evacuations.  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  Case discussed with neurosurgery who deemed slight improvement from prior films since surgery.  Since the right foot strength is slightly improving.  Patient will need aggressive rehabilitation plan process, currently awaiting SNF placement.  #Acute PE/DVT, stable.  Very complicated situation as patient  previously had IVC filter placed (03/2018) and prior history of DVT in lower extremities for which he had previously been on anticoagulation.  He was found to have submassive PE on 11/13 with evidence of right heart strain and new DVT in the left subclavian location prompting reinitiation of anticoagulation 11/28.  He is currently on Eliquis (previously been on bivalirudin and ARC Atrovent) tolerating well with stable counts, no current oxygen requirements.  Edema in bilateral lower extremities and upper left extremity.  Suspect this is most likely combination of dependent edema and third spacing in the setting of hypoalbuminemia.  Patient has been on IV Lasix with increased to 60 mg recently, with approximately 13 L out since admission.  No history of CHF, anticipate improvement as patient is getting massage/muscle activation with physical therapy, frequent turnings.  Will continue IV Lasix for now anticipate will de-escalate to oral 4 hours  #Incidental finding of pneumoperitoneum on 11/24.  Found on repeat CT abdomen.  Upper GI series on 8/25 was negative for any perforation.  Patient has remained clinically stable without nausea, vomiting or worsening abdominal pain.  Continue PPI for stress ulcer prophylaxis.  Completed a course of antibiotics during hospital stay.  #Thrombocytopenia, resolved.  Extensive work-up including bone marrow biopsy, initiation of multiple platelet transfusions with assistance from Dr. Marin Olp with no  clear etiology  #Loose stool.  Patient denies any current gout-like symptoms.  Would like to discontinue colchicine and monitor.  No other medications that could be contributing.  Denies any abdominal pain nausea or vomiting.  #Anxiety/depression, stable.  Continue scheduled Klonopin  #Type 2 diabetes.  Continue Lantus 15 units, sliding scale, monitor CBG  #Hypokalemia, resolved.  Currently on maintenance dose potassium, likely setting of aggressive IV Lasix diuresis.   Continue to monitor BMP.  #Anemia, acute blood loss anemia in setting of multiple hematoma/evacuations while on anticoagulation.  Currently stable.  Monitor CBC.  #  Sinus bradycardia, resolved.  #Hypertension, not at goal.  SBP range in last 24 hours 159/101-130/91.  Continue to monitor, continue PRN hydralazine as needed, if remains persistently high will initiate BP med control.  #Stage II pressure ulcer on the right ischial tuberosity, not present on admission.  Wound care consulted and following recommendations   #Severe OA of left knee.  Pain resolved.  Discontinued colchicine given loose stool.  Continue oxycodone for as needed relief, IV morphine for breakthrough. Code Status: Full code  Family Communication: No family at bedside  Disposition Plan: Medically ready for discharge, currently awaiting bed availability for SNF, still requiring IV Lasix will discuss transitioning to oral regimen.  Consultants:  Hematology/oncology, cardiology, neurosurgery, surgery   Antimicrobials: Anti-infectives (From admission, onward)   Start     Dose/Rate Route Frequency Ordered Stop   08/13/18 1000  ceFEPIme (MAXIPIME) 1 g in sodium chloride 0.9 % 100 mL IVPB     1 g 200 mL/hr over 30 Minutes Intravenous Every 8 hours 08/13/18 0949 08/14/18 2259   08/05/18 1415  vancomycin (VANCOCIN) IVPB 1000 mg/200 mL premix  Status:  Discontinued     1,000 mg 200 mL/hr over 60 Minutes Intravenous Every 8 hours 08/05/18 1410 08/08/18 1257   08/05/18 1415  ceFEPIme (MAXIPIME) 1 g in sodium chloride 0.9 % 100 mL IVPB     1 g 200 mL/hr over 30 Minutes Intravenous Every 8 hours 08/05/18 1410 08/12/18 0544   08/04/18 0645  levofloxacin (LEVAQUIN) IVPB 500 mg  Status:  Discontinued     500 mg 100 mL/hr over 60 Minutes Intravenous Every 24 hours 08/04/18 0642 08/05/18 1316   07/25/18 1700  fluconazole (DIFLUCAN) IVPB 400 mg  Status:  Discontinued     400 mg 100 mL/hr over 120 Minutes Intravenous Every 24  hours 07/25/18 1633 07/27/18 1415   07/12/18 2100  ceFAZolin (ANCEF) IVPB 2g/100 mL premix  Status:  Discontinued     2 g 200 mL/hr over 30 Minutes Intravenous Every 8 hours 07/12/18 1555 07/15/18 1050   07/10/18 2229  bacitracin 50,000 Units in sodium chloride 0.9 % 500 mL irrigation  Status:  Discontinued       As needed 07/10/18 2230 07/10/18 2300   07/07/18 1133  bacitracin 50,000 Units in sodium chloride 0.9 % 500 mL irrigation  Status:  Discontinued       As needed 07/07/18 1133 07/07/18 1357   07/06/18 0900  ceFAZolin (ANCEF) IVPB 2g/100 mL premix  Status:  Discontinued     2 g 200 mL/hr over 30 Minutes Intravenous Every 8 hours 07/06/18 0810 07/12/18 1555   07/01/18 2240  bacitracin 50,000 Units in sodium chloride 0.9 % 500 mL irrigation  Status:  Discontinued       As needed 07/01/18 2240 07/01/18 2324   06/30/18 1330  doxycycline (VIBRA-TABS) tablet 100 mg  Status:  Discontinued     100 mg Oral Every 12 hours 06/30/18 1329 07/12/18 1429   06/27/18 1000  fluconazole (DIFLUCAN) tablet 200 mg  Status:  Discontinued     200 mg Oral Daily 06/27/18 0725 07/04/18 0720   06/22/18 2315  ceFAZolin (ANCEF) IVPB 2g/100 mL premix     2 g 200 mL/hr over 30 Minutes Intravenous Every 8 hours 06/22/18 2314 06/27/18 1519   06/22/18 1828  bacitracin 50,000 Units in sodium chloride 0.9 % 500 mL irrigation  Status:  Discontinued       As needed 06/22/18 1828 06/22/18 1915   06/22/18 1700  ceFAZolin (ANCEF) 3 g in dextrose 5 % 50 mL IVPB  Status:  Discontinued     3 g 100 mL/hr over 30 Minutes Intravenous  Once 06/22/18 1647 06/22/18 2101   06/14/18 2100  ceFAZolin (ANCEF) IVPB 2g/100 mL premix     2 g 200 mL/hr over 30 Minutes Intravenous Every 8 hours 06/14/18 1712 06/17/18 0800   06/14/18 1414  bacitracin 50,000 Units in sodium chloride 0.9 % 500 mL irrigation  Status:  Discontinued       As needed 06/14/18 1414 06/14/18 1647   06/14/18 1215  ceFAZolin (ANCEF) 3 g in dextrose 5 % 50 mL IVPB      3 g 160 mL/hr over 30 Minutes Intravenous To [facility] 06/14/18 1202 06/14/18 2225   06/14/18 1200  ceFAZolin (ANCEF) IVPB 2g/100 mL premix  Status:  Discontinued     2 g 200 mL/hr over 30 Minutes Intravenous On call to O.R. 06/14/18 1157 06/14/18 2140           DVT prophylaxis: Eliquis   Objective: Vitals:   08/23/18 1539 08/23/18 1600 08/23/18 1700 08/23/18 1900  BP: (!) 170/102 (!) 138/93  (!) 130/91  Pulse: 99 98 (!) 103   Resp: 15 (!) 0 19 13  Temp: 97.8 F (36.6 C)   97.9 F (36.6 C)  TempSrc: Oral   Oral  SpO2: 98% 96% 95% 96%  Weight:      Height:        Intake/Output Summary (Last 24 hours) at 08/23/2018 2052 Last data filed at 08/23/2018 1725 Gross per 24 hour  Intake 480 ml  Output 2250 ml  Net -1770 ml   Filed Weights   08/18/18 0409 08/19/18 0500 08/22/18 0500  Weight: 125.5 kg 125.9 kg 127.8 kg    Exam:  Constitutional: Obese male, no distress Eyes: EOMI, anicteric, normal conjunctivae ENMT: Oropharynx with moist mucous membranes, normal dentition Cardiovascular: RRR no MRGs, nonpitting edema most pronounced when proximal bilateral lower extremities and left upper extremity Respiratory: Normal respiratory effort on room air, clear breath sounds on anterior chest Abdomen: Soft,non-tender, normal bowel sounds Skin: No rash ulcers, or lesions. Without skin tenting  Neurologic: Normal strength in right upper extremity, able to move left hand and some elevation against gravity in same arm, no movement in left leg, wiggles toes and right lower extremity Psychiatric:Appropriate affect, and mood. Mental status AAOx3  Data Reviewed: CBC: Recent Labs  Lab 08/19/18 0620 08/20/18 0649 08/21/18 0324 08/22/18 0930 08/23/18 1138  WBC 11.6* 11.6* 12.1* 16.0* 16.1*  HGB 10.7* 10.8* 10.9* 10.7* 11.3*  HCT 35.9* 35.2* 35.7* 36.7* 38.7*  MCV 97.6 95.7 94.9 97.1 98.0  PLT 178 190 185 208 119   Basic Metabolic Panel: Recent Labs  Lab  08/17/18 0630 08/18/18 0432 08/19/18 0620 08/21/18 0324 08/23/18 1138  NA 140 142 141 141 142  K 3.9 4.3 4.4 4.3 4.3  CL 100 101 99 98 100  CO2 28 32 32 32 30  GLUCOSE 152* 109* 104* 212* 144*  BUN '15 14 15 ' 25* 44*  CREATININE 0.68 0.88 0.61 0.77 0.69  CALCIUM 9.0 9.3 9.3 9.4 9.9   GFR: Estimated Creatinine Clearance: 142.8 mL/min (by C-G formula based on SCr of 0.69 mg/dL). Liver Function Tests: No results for input(s): AST, ALT, ALKPHOS, BILITOT, PROT, ALBUMIN in the last 168 hours. No results for input(s): LIPASE, AMYLASE in the last 168 hours. No results for input(s): AMMONIA in the last 168 hours. Coagulation Profile: No results  for input(s): INR, PROTIME in the last 168 hours. Cardiac Enzymes: No results for input(s): CKTOTAL, CKMB, CKMBINDEX, TROPONINI in the last 168 hours. BNP (last 3 results) No results for input(s): PROBNP in the last 8760 hours. HbA1C: No results for input(s): HGBA1C in the last 72 hours. CBG: Recent Labs  Lab 08/22/18 1521 08/22/18 2137 08/23/18 0740 08/23/18 1206 08/23/18 1721  GLUCAP 179* 202* 111* 114* 112*   Lipid Profile: No results for input(s): CHOL, HDL, LDLCALC, TRIG, CHOLHDL, LDLDIRECT in the last 72 hours. Thyroid Function Tests: No results for input(s): TSH, T4TOTAL, FREET4, T3FREE, THYROIDAB in the last 72 hours. Anemia Panel: No results for input(s): VITAMINB12, FOLATE, FERRITIN, TIBC, IRON, RETICCTPCT in the last 72 hours. Urine analysis:    Component Value Date/Time   COLORURINE AMBER (A) 06/07/2018 0900   APPEARANCEUR CLOUDY (A) 06/07/2018 0900   LABSPEC 1.028 06/07/2018 0900   PHURINE 5.0 06/07/2018 0900   GLUCOSEU NEGATIVE 06/07/2018 0900   HGBUR SMALL (A) 06/07/2018 0900   BILIRUBINUR NEGATIVE 06/07/2018 0900   KETONESUR 5 (A) 06/07/2018 0900   PROTEINUR 30 (A) 06/07/2018 0900   NITRITE NEGATIVE 06/07/2018 0900   LEUKOCYTESUR NEGATIVE 06/07/2018 0900   Sepsis  Labs: '@LABRCNTIP' (procalcitonin:4,lacticidven:4)  )No results found for this or any previous visit (from the past 240 hour(s)).    Studies: No results found.  Scheduled Meds: . sodium chloride   Intravenous Once  . sodium chloride   Intravenous Once  . apixaban  5 mg Oral BID  . bismuth subsalicylate  30 mL Oral TID AC & HS  . Chlorhexidine Gluconate Cloth  6 each Topical Q0600  . clonazePAM  0.5 mg Oral BID  . colchicine  0.6 mg Oral Daily  . collagenase   Topical Daily  . famotidine  20 mg Oral Daily  . feeding supplement (ENSURE ENLIVE)  237 mL Oral TID BM  . furosemide  40 mg Intravenous Once  . furosemide  60 mg Intravenous Q12H  . gabapentin  300 mg Oral TID  . insulin aspart  0-15 Units Subcutaneous TID WC  . insulin aspart  0-5 Units Subcutaneous QHS  . insulin glargine  15 Units Subcutaneous QHS  . magic mouthwash w/lidocaine  5 mL Oral TID  . multivitamin with minerals  1 tablet Oral Daily  . potassium chloride  40 mEq Oral BID  . sodium chloride flush  10-40 mL Intracatheter Q12H    Continuous Infusions: . sodium chloride 10 mL/hr at 08/15/18 0200     LOS: 77 days     Desiree Hane, MD [facility] Pager [phone]  If 7PM-7AM, please contact night-coverage [email or web address] Password [facility] 08/23/2018, 8:52 PM

## 2018-08-23 NOTE — Social Work (Addendum)
CSW spoke with pt at bedside. Introduced self, role, and reason for visit. We discussed previous visit by CSW Joey. Pt states that he was not explained why he needed to give his SSN so he didn't. Once CSW explained why we needed SSN pt provided CSW with that information and it was added to FL2. We discussed rehab and coverage under [facility]. He is aware that I spoke with [facility] case Production designer, theatre/television/filmmanager. He is interested in rehab [place], [facility] he would prefer to be in [place] instead.   CSW explained that I would contact [facility] case manager to obtain [place] facility information and we would also discuss transportation since pt transport to [place] if they are able to offer a bed would be outside of the 50 mile radius. Continuing to follow.   Octavio GravesIsabel Lashaun Krapf, MSW, [facility] [place] Clinical Social Work [phone]

## 2018-08-24 DIAGNOSIS — L8990 Pressure ulcer of unspecified site, unspecified stage: Secondary | ICD-10-CM

## 2018-08-24 LAB — BASIC METABOLIC PANEL
Anion gap: 9 (ref 5–15)
BUN: 41 mg/dL — ABNORMAL HIGH (ref 6–20)
CO2: 32 mmol/L (ref 22–32)
Calcium: 9.6 mg/dL (ref 8.9–10.3)
Chloride: 103 mmol/L (ref 98–111)
Creatinine, Ser: 0.76 mg/dL (ref 0.61–1.24)
GFR calc Af Amer: >60 mL/min (ref >60)
GFR calc non Af Amer: >60 mL/min (ref >60)
Glucose, Bld: 93 mg/dL (ref 70–99)
Potassium: 4.1 mmol/L (ref 3.5–5.1)
Sodium: 144 mmol/L (ref 135–145)

## 2018-08-24 LAB — CBC
HCT: 39.2 % (ref 39.0–52.0)
Hemoglobin: 11.7 g/dL — ABNORMAL LOW (ref 13.0–17.0)
MCH: 29.2 pg (ref 26.0–34.0)
MCHC: 29.8 g/dL — ABNORMAL LOW (ref 30.0–36.0)
MCV: 97.8 fL (ref 80.0–100.0)
Platelets: 225 10*3/uL (ref 150–400)
RBC: 4.01 MIL/uL — AB (ref 4.22–5.81)
RDW: 17.3 % — ABNORMAL HIGH (ref 11.5–15.5)
WBC: 13.3 10*3/uL — ABNORMAL HIGH (ref 4.0–10.5)
nRBC: 0.2 % (ref 0.0–0.2)

## 2018-08-24 LAB — GLUCOSE, CAPILLARY
GLUCOSE-CAPILLARY: 103 mg/dL — AB (ref 70–99)
Glucose-Capillary: 83 mg/dL (ref 70–99)
Glucose-Capillary: 99 mg/dL (ref 70–99)
Glucose-Capillary: 134 mg/dL — ABNORMAL HIGH (ref 70–99)

## 2018-08-24 NOTE — Progress Notes (Signed)
PROGRESS NOTE  [name] [name] DXA:[id number] DOB: [birth year] DOA: 06/06/2018 PCP: Helane Rima, MD  HPI/Brief Narrative  [name] [name] is a 52 y.o. year old male with medical history significant for gout, hypertension, underwent anterior cervical corpectomy of C4 on 10/23/4707 with complicated postoperative course including development of multiple PEs and DVTs started on anticoagulation and subsequently developed C spinal hematoma requiring reexploration on 03/28/2018 status post placement of IVC filter then discharged to SNF who presented on 06/06/2018 is a transfer from [name] with cervical spine MRI showing changes consistent with cord compression, cord edema and concern for ischemia.  His hospital course has been complicated by cervical decompression laminectomy, postoperative hematomas and multiple evacuations, new bilateral lower extremity DVTs, epidural hematoma (62/83, 66/29) complicating anticoagulation therapy (briefly on her gastric band and discontinued given recurrent hematomas), new PE, bone marrow biopsy for evaluation of thrombocytopenia, platelet and endplate transfusions.  Most recently she had repeat imaging of the cervical spine (MRI which neurosurgery evaluated and deemed it looked stable and recommended no further work-up  Subjective Continues to report watery stool No abdominal pain, no nausea Decreased appetite, reports being a picky eater, amenable to try V8 juice  Assessment/Plan:  #Severe cervical spine stenosis, stable.  Stable quadriparesis, right upper extremity stronger than left upper extremity (consistent with prior exams based on chart review by neurosurgery evaluation) status post decompressive surgery complicated by multiple postoperative hematomas and evacuations.  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  Case discussed with neurosurgery who deemed slight improvement from prior films since  surgery.   will need aggressive rehabilitation with SNF  #Acute PE/DVT, stable.  Very complicated situation as patient previously had IVC filter placed (03/2018) and prior history of DVT in lower extremities for which he had previously been on anticoagulation.  He was found to have submassive PE on 11/13 with evidence of right heart strain and new DVT in the left subclavian location prompting reinitiation of anticoagulation 11/28.  He is currently on Eliquis (previously been on bivalirudin and ARC Atrovent) tolerating well with stable counts, no current oxygen requirements.  #Edema in bilateral lower extremities and upper left extremity, stable.  Suspect this is most likely combination of dependent edema and third spacing in the setting of hypoalbuminemia. With 17.3 L out since admission.  No history of CHF, anticipate improvement as patient is getting massage/muscle activation with physical therapy, frequent turnings.  Will continue IV Lasix 60 mg BID for now anticipate will de-escalate to oral once he's able to go to SNF  #Incidental finding of pneumoperitoneum on 11/24, resolved.  Found on repeat CT abdomen.  Upper GI series on 8/25 was negative for any perforation.  Patient has remained clinically stable without nausea, vomiting or worsening abdominal pain.  Continue PPI for stress ulcer prophylaxis.  Completed a course of antibiotics during hospital stay.  #Thrombocytopenia, resolved.  Extensive work-up including bone marrow biopsy, initiation of multiple platelet transfusions with assistance from Dr. Marin Olp with no  clear etiology  #Loose stool.  Patient denies any current gout-like symptoms.  Would like to discontinue colchicine and monitor.  No other medications that could be contributing.  Denies any abdominal pain nausea or vomiting.  #Anxiety/depression, stable.  Continue scheduled Klonopin  #Type 2 diabetes.  A1c 5.8 continue Lantus 15 units, sliding scale, monitor CBG  #Hypokalemia,  resolved.  Currently on maintenance dose potassium, likely setting of aggressive IV Lasix diuresis.  Continue to monitor BMP.  #Anemia,  acute blood loss anemia in setting of multiple hematoma/evacuations while on anticoagulation.  Currently stable with hemoglobin range of 10-11.  Monitor CBC.  #Sinus bradycardia, resolved.  #Hypertension, not at goal but improving.  SBP improving currently in the 130s ( previously 026V), diastolic still  elevated mid 90s to 100.  Continue to monitor, continue PRN hydralazine as needed, if remains persistently high will initiate BP med control.  #Stage II pressure ulcer on the right ischial tuberosity, not present on admission.  Wound care consulted and following recommendations   #Severe OA of left knee.  Pain resolved.  Discontinued colchicine given loose stool.  Continue oxycodone for as needed relief, IV morphine for breakthrough. Code Status: Full code  Family Communication: No family at bedside  Disposition Plan: Medically ready for discharge, currently awaiting bed availability for SNF, still requiring IV Lasix will discuss transitioning to oral regimen upon bed availability  Consultants:  Hematology/oncology, cardiology, neurosurgery, surgery  Procedures:  On 10/2 1. posterior cervical decompressive laminectomy at C3, C4, C5 with removal of the spinous process and complete laminectomy and foraminotomies of the C3, C4, C5 nerve roots. 2. Posterior cervical fusion with lateral mass screws at C3-C4-C5 and pars screws at C2 lies in the globus ellipseLateral mass screw system 3. Posterior lateral arthrodesis C2-C5 utilizing locally harvested autograft mixed with vivigen  06/22/2018: Procedure reexploration posterior cervical wound for evacuation of epidural hematoma  06/26/2018 DVT ultrasound:Right: Findings consistent with acute deep vein thrombosis involving the right common femoral vein, right femoral vein, right popliteal vein, right  posterior tibial vein, and right peroneal vein. Thrombosis extends proximally into external iliac vein.  Proximal iliac vein not visualized. Left: Findings consistent with acute deep vein thrombosis involving the left common femoral vein, left femoral vein, left popliteal vein, left posterior tibial vein, and left peroneal vein. Findings consistent with acute superficial vein thrombosis  involving the left great saphenous vein. Thrombosis extends proximally into external iliac vein. Proximal iliac vein not visualized.  07/01/18 Clot evacuation   07/02/18 RUE doppler, no DVT in right upper extremity  10/28/2019Bilateral lower extremity Doppler, acute DVT in right, acute DVT in left common femoral vein, left proximal profunda vein, left femoral vein, left popliteal vein, left posterior tibial vein, and left peroneal vein  TTE, 07/27/2018, EF 78-58%, grade 1 diastolic dysfunction  85/10/7739 bilateral lower extremity Doppler, BT bilateral saphenofemoral junctions, common femoral, femoral, popliteal, posterior tibial, and peroneal veins  08/10/2018 Left upper extremity duplex, acute DVT in left subclavian veins, left axillary vein, left brachial vein and left radial veins.  Acute superficial vein thrombosis involving the left basilar vein and left cephalic vein.   Antimicrobials: Anti-infectives (From admission, onward)   Start     Dose/Rate Route Frequency Ordered Stop   08/13/18 1000  ceFEPIme (MAXIPIME) 1 g in sodium chloride 0.9 % 100 mL IVPB     1 g 200 mL/hr over 30 Minutes Intravenous Every 8 hours 08/13/18 0949 08/14/18 2259   08/05/18 1415  vancomycin (VANCOCIN) IVPB 1000 mg/200 mL premix  Status:  Discontinued     1,000 mg 200 mL/hr over 60 Minutes Intravenous Every 8 hours 08/05/18 1410 08/08/18 1257   08/05/18 1415  ceFEPIme (MAXIPIME) 1 g in sodium chloride 0.9 % 100 mL IVPB     1 g 200 mL/hr over 30 Minutes Intravenous Every 8 hours 08/05/18 1410 08/12/18 0544   08/04/18  0645  levofloxacin (LEVAQUIN) IVPB 500 mg  Status:  Discontinued     500 mg  100 mL/hr over 60 Minutes Intravenous Every 24 hours 08/04/18 0642 08/05/18 1316   07/25/18 1700  fluconazole (DIFLUCAN) IVPB 400 mg  Status:  Discontinued     400 mg 100 mL/hr over 120 Minutes Intravenous Every 24 hours 07/25/18 1633 07/27/18 1415   07/12/18 2100  ceFAZolin (ANCEF) IVPB 2g/100 mL premix  Status:  Discontinued     2 g 200 mL/hr over 30 Minutes Intravenous Every 8 hours 07/12/18 1555 07/15/18 1050   07/10/18 2229  bacitracin 50,000 Units in sodium chloride 0.9 % 500 mL irrigation  Status:  Discontinued       As needed 07/10/18 2230 07/10/18 2300   07/07/18 1133  bacitracin 50,000 Units in sodium chloride 0.9 % 500 mL irrigation  Status:  Discontinued       As needed 07/07/18 1133 07/07/18 1357   07/06/18 0900  ceFAZolin (ANCEF) IVPB 2g/100 mL premix  Status:  Discontinued     2 g 200 mL/hr over 30 Minutes Intravenous Every 8 hours 07/06/18 0810 07/12/18 1555   07/01/18 2240  bacitracin 50,000 Units in sodium chloride 0.9 % 500 mL irrigation  Status:  Discontinued       As needed 07/01/18 2240 07/01/18 2324   06/30/18 1330  doxycycline (VIBRA-TABS) tablet 100 mg  Status:  Discontinued     100 mg Oral Every 12 hours 06/30/18 1329 07/12/18 1429   06/27/18 1000  fluconazole (DIFLUCAN) tablet 200 mg  Status:  Discontinued     200 mg Oral Daily 06/27/18 0725 07/04/18 0720   06/22/18 2315  ceFAZolin (ANCEF) IVPB 2g/100 mL premix     2 g 200 mL/hr over 30 Minutes Intravenous Every 8 hours 06/22/18 2314 06/27/18 1519   06/22/18 1828  bacitracin 50,000 Units in sodium chloride 0.9 % 500 mL irrigation  Status:  Discontinued       As needed 06/22/18 1828 06/22/18 1915   06/22/18 1700  ceFAZolin (ANCEF) 3 g in dextrose 5 % 50 mL IVPB  Status:  Discontinued     3 g 100 mL/hr over 30 Minutes Intravenous  Once 06/22/18 1647 06/22/18 2101   06/14/18 2100  ceFAZolin (ANCEF) IVPB 2g/100 mL premix     2 g 200  mL/hr over 30 Minutes Intravenous Every 8 hours 06/14/18 1712 06/17/18 0800   06/14/18 1414  bacitracin 50,000 Units in sodium chloride 0.9 % 500 mL irrigation  Status:  Discontinued       As needed 06/14/18 1414 06/14/18 1647   06/14/18 1215  ceFAZolin (ANCEF) 3 g in dextrose 5 % 50 mL IVPB     3 g 160 mL/hr over 30 Minutes Intravenous To [facility] 06/14/18 1202 06/14/18 2225   06/14/18 1200  ceFAZolin (ANCEF) IVPB 2g/100 mL premix  Status:  Discontinued     2 g 200 mL/hr over 30 Minutes Intravenous On call to O.R. 06/14/18 1157 06/14/18 2140          DVT prophylaxis: Eliquis   Objective: Vitals:   08/24/18 0000 08/24/18 0416 08/24/18 0800 08/24/18 1135  BP:    (!) 137/100  Pulse:    (!) 112  Resp:    15  Temp: 98.3 F (36.8 C) 97.7 F (36.5 C) 97.7 F (36.5 C) 97.8 F (36.6 C)  TempSrc: Oral Oral Oral Oral  SpO2:    94%  Weight:  128.9 kg    Height:        Intake/Output Summary (Last 24 hours) at 08/24/2018 1520 Last data filed  at 08/24/2018 0901 Gross per 24 hour  Intake -  Output 2000 ml  Net -2000 ml   Filed Weights   08/19/18 0500 08/22/18 0500 08/24/18 0416  Weight: 125.9 kg 127.8 kg 128.9 kg    Exam:  Constitutional: Obese male, no distress Eyes: EOMI, anicteric, normal conjunctivae ENMT: Oropharynx with moist mucous membranes, normal dentition Cardiovascular: RRR no MRGs, nonpitting edema most pronounced when proximal bilateral lower extremities and left upper extremity Respiratory: Normal respiratory effort on room air, clear breath sounds on anterior chest Abdomen: Soft,non-tender, normal bowel sounds Skin: No rash ulcers, or lesions. Without skin tenting  Neurologic: Normal strength in right upper extremity, able to move left hand against gravity but no other strength, no movement in right leg, wiggles toes and left lower extremity Psychiatric:flat affect, and mood. Mental status AAOx3  Data Reviewed: CBC: Recent Labs  Lab  08/20/18 0649 08/21/18 0324 08/22/18 0930 08/23/18 1138 08/24/18 0520  WBC 11.6* 12.1* 16.0* 16.1* 13.3*  HGB 10.8* 10.9* 10.7* 11.3* 11.7*  HCT 35.2* 35.7* 36.7* 38.7* 39.2  MCV 95.7 94.9 97.1 98.0 97.8  PLT 190 185 208 223 417   Basic Metabolic Panel: Recent Labs  Lab 08/18/18 0432 08/19/18 0620 08/21/18 0324 08/23/18 1138 08/24/18 0520  NA 142 141 141 142 144  K 4.3 4.4 4.3 4.3 4.1  CL 101 99 98 100 103  CO2 32 32 32 30 32  GLUCOSE 109* 104* 212* 144* 93  BUN 14 15 25* 44* 41*  CREATININE 0.88 0.61 0.77 0.69 0.76  CALCIUM 9.3 9.3 9.4 9.9 9.6   GFR: Estimated Creatinine Clearance: 143.6 mL/min (by C-G formula based on SCr of 0.76 mg/dL). Liver Function Tests: No results for input(s): AST, ALT, ALKPHOS, BILITOT, PROT, ALBUMIN in the last 168 hours. No results for input(s): LIPASE, AMYLASE in the last 168 hours. No results for input(s): AMMONIA in the last 168 hours. Coagulation Profile: No results for input(s): INR, PROTIME in the last 168 hours. Cardiac Enzymes: No results for input(s): CKTOTAL, CKMB, CKMBINDEX, TROPONINI in the last 168 hours. BNP (last 3 results) No results for input(s): PROBNP in the last 8760 hours. HbA1C: No results for input(s): HGBA1C in the last 72 hours. CBG: Recent Labs  Lab 08/23/18 1206 08/23/18 1721 08/23/18 2139 08/24/18 0858 08/24/18 1144  GLUCAP 114* 112* 111* 83 103*   Lipid Profile: No results for input(s): CHOL, HDL, LDLCALC, TRIG, CHOLHDL, LDLDIRECT in the last 72 hours. Thyroid Function Tests: No results for input(s): TSH, T4TOTAL, FREET4, T3FREE, THYROIDAB in the last 72 hours. Anemia Panel: No results for input(s): VITAMINB12, FOLATE, FERRITIN, TIBC, IRON, RETICCTPCT in the last 72 hours. Urine analysis:    Component Value Date/Time   COLORURINE AMBER (A) 06/07/2018 0900   APPEARANCEUR CLOUDY (A) 06/07/2018 0900   LABSPEC 1.028 06/07/2018 0900   PHURINE 5.0 06/07/2018 0900   GLUCOSEU NEGATIVE 06/07/2018 0900    HGBUR SMALL (A) 06/07/2018 0900   BILIRUBINUR NEGATIVE 06/07/2018 0900   KETONESUR 5 (A) 06/07/2018 0900   PROTEINUR 30 (A) 06/07/2018 0900   NITRITE NEGATIVE 06/07/2018 0900   LEUKOCYTESUR NEGATIVE 06/07/2018 0900   Sepsis Labs: '@LABRCNTIP' (procalcitonin:4,lacticidven:4)  )No results found for this or any previous visit (from the past 240 hour(s)).    Studies: No results found.  Scheduled Meds: . sodium chloride   Intravenous Once  . sodium chloride   Intravenous Once  . apixaban  5 mg Oral BID  . bismuth subsalicylate  30 mL Oral TID AC & HS  .  Chlorhexidine Gluconate Cloth  6 each Topical Q0600  . clonazePAM  0.5 mg Oral BID  . collagenase   Topical Daily  . famotidine  20 mg Oral Daily  . feeding supplement (ENSURE ENLIVE)  237 mL Oral TID BM  . furosemide  40 mg Intravenous Once  . furosemide  60 mg Intravenous Q12H  . gabapentin  300 mg Oral TID  . insulin aspart  0-15 Units Subcutaneous TID WC  . insulin aspart  0-5 Units Subcutaneous QHS  . insulin glargine  15 Units Subcutaneous QHS  . magic mouthwash w/lidocaine  5 mL Oral TID  . multivitamin with minerals  1 tablet Oral Daily  . potassium chloride  40 mEq Oral BID  . sodium chloride flush  10-40 mL Intracatheter Q12H    Continuous Infusions: . sodium chloride 10 mL/hr at 08/15/18 0200     LOS: 78 days     Desiree Hane, MD [facility] Pager [phone]  If 7PM-7AM, please contact night-coverage [email or web address] Password [email or web address] 08/24/2018, 3:20 PM

## 2018-08-24 NOTE — Social Work (Addendum)
1:27pm- CSW received return call from [place] with [facility] who is covering for [employer]. Darl PikesSusan requested CSW email to send list of SNFs under [facility], when this list is received will bring to pt for him to review and start authorization. Darl PikesSusan provided her number as well [phone].   11:15am- CSW called pt [facility] care manager Ocie CornfieldKim Hall at [phone] ext [phone] requesting more information regarding facilities covered by [facility] since [facility]. CSW awaits return call.  Octavio GravesIsabel Tyiana Hill, MSW, [facility] [place] Clinical Social Work [phone]

## 2018-08-24 NOTE — Plan of Care (Signed)

## 2018-08-24 NOTE — Progress Notes (Signed)
Physical Therapy Treatment Patient Details Name: [name] [name] MRN: [id number] DOB: 22-Apr-1966 Today's Date: 08/24/2018    History of Present Illness 52 y.o. male admitted on 06/06/18 for bil LE weakness and muscle spasm after multiple falls (with resultant R knee meniscus tear placed in bledsoe brace) following a recent ACDF at C3 in July 2019 (complicated by PE (s/p IVC filter), CIR, and then SNF placement before finally returning home with his parents).  In the ED MRI of c-spine which showed post operative changes and cord edema and possibility of ischemia.  His R knee was aspirated on 06/09/18.  S/p posterior cervical decompression and fusion on 06/14/18. Entire acute course complicated by hypotension in standing (thought to be autonomic) and by finding of multiple LE DVTs (already has an IVC filter in place, but per MDs notes at risk for blocking the filter with these clots).  Pt with post op issues with incisional bleeding s/p 2 evacuations of hematomas and placement of hemovac (06/22/18 and 07/01/18) as well as multiple units of plasma.  Cardiology consulted 07/05/18 due to bradycardia thought to be from his newly started blood thinner.  Pt with another re-accumulation of epidural hematoma with increased L sided weakness and cord compression with emergent evacuation surgery 10/25 & 07/10/18.  Pt also needing more platelets post op.  Bone marrow bx 07/12/18.  Pt found to have submassive PE on 07/27/18 and re-started on anticoagulation. PMHx of HTN, gout, anemia, DDD (lumbar).  08/10/18 found to have a L UE thrombus (same arm as his PICC line).      PT Comments    Pt doing well with activities EOB, sat EOB >10 minutes today working on weight shifting, UE positioning to help with sitting balance, neck ROM, upper scap strength, trunk rotation.  He remains flat when we come in, but always seems to perk up with mobility and the company of the therapists.  I would like to re-assess if he can fit into one  of our tilt in space WCs so that we can get him out of room time.  Also, it would be beneficial to get him sitting on a mat table to work on sitting balance, so if we can get him up to a WC maybe we can take him to the ortho gym to work out next week.  He seems to be stabilizing (see IM notes), so I have yet to contact a WC vendor for fear he will d/c soon and the vendor will lose his business.  PT will continue to follow acutely for safe mobility progression  Follow Up Recommendations  SNF     Equipment Recommendations  Wheelchair (measurements PT);Wheelchair cushion (measurements PT);Hospital bed(22x20 power tilt in space, power drive, roho, hoyer lift)    Recommendations for Other Services   NA     Precautions / Restrictions Precautions Precautions: Fall;Cervical;Other (comment) Precaution Comments: Several bed sores on bottom.   Cervical Brace: Soft collar Other Brace: rectal tube and condom cath    Mobility  Bed Mobility Overal bed mobility: Needs Assistance Bed Mobility: Rolling;Sidelying to Sit;Sit to Sidelying Rolling: +2 for physical assistance;Total assist Sidelying to sit: +2 for physical assistance;Total assist     Sit to sidelying: +2 for physical assistance;Total assist General bed mobility comments: Two person total assist to roll to his right, progress bil LE off of side of bed and boost trunk up to sitting EOB.  Assist needed to return to supine at trunk and bil LEs. Because we usually end up  at the end of the bed in sitting, we usually remove the foot board and position a chair at the end of the bed to hold his feet until we can get him pulled higher in the bed.            Balance Overall balance assessment: Needs assistance Sitting-balance support: Feet supported Sitting balance-Leahy Scale: Poor Sitting balance - Comments: max assist EOB, Pt needs assist, especially on the left arm to position arms in prop position to help support trunk.  In sitting, worked  on cervical ROM, neck upright and in midline, scap retractions and trunk rotation bil.  Postural control: Other (comment)(difficult to say as even with max inflate- bed is uneven)                                  Cognition Arousal/Alertness: Awake/alert Behavior During Therapy: Flat affect(started flat, but improved with mobility) Overall Cognitive Status: Within Functional Limits for tasks assessed                                        Exercises General Exercises - Upper Extremity Shoulder Flexion: AAROM;PROM;Left;10 reps;Supine Shoulder Extension: PROM;Left;20 reps;Supine Shoulder ABduction: AROM;AAROM;Right;10 reps Shoulder ADduction: AROM;AAROM;Right;10 reps Elbow Flexion: AAROM;PROM;Left;10 reps;Supine Elbow Extension: PROM;AAROM;Left;10 reps;Supine Wrist Flexion: PROM;AAROM;20 reps;Left Wrist Extension: AAROM;PROM;Right;Left;10 reps General Exercises - Lower Extremity Ankle Circles/Pumps: PROM;Both;10 reps Heel Slides: PROM;Both;10 reps Hip ABduction/ADduction: PROM;Both;10 reps Other Exercises Other Exercises: hip IR bil x 10 reps Other Exercises: Retrograde massage to bil legs, arms x 15 mins.        Pertinent Vitals/Pain Pain Assessment: Faces Faces Pain Scale: Hurts little more Pain Location: generalized with end ROM, neck with transitions Pain Descriptors / Indicators: Grimacing;Guarding Pain Intervention(s): Limited activity within patient's tolerance;Monitored during session;Repositioned           PT Goals (current goals can now be found in the care plan section) Acute Rehab PT Goals Patient Stated Goal: to have improvements in his strength so he can start standing again. Progress towards PT goals: Progressing toward goals    Frequency    Min 2X/week      PT Plan Current plan remains appropriate       AM-PAC PT "6 Clicks" Mobility   Outcome Measure  Help needed turning from your back to your side while in a flat  bed without using bedrails?: Total Help needed moving from lying on your back to sitting on the side of a flat bed without using bedrails?: Total Help needed moving to and from a bed to a chair (including a wheelchair)?: Total Help needed standing up from a chair using your arms (e.g., wheelchair or bedside chair)?: Total Help needed to walk in hospital room?: Total Help needed climbing 3-5 steps with a railing? : Total 6 Click Score: 6    End of Session Equipment Utilized During Treatment: Cervical collar(soft collar) Activity Tolerance: Patient tolerated treatment well Patient left: in bed;with call bell/phone within reach   PT Visit Diagnosis: Other abnormalities of gait and mobility (R26.89);Other symptoms and signs involving the nervous system (R29.898);Muscle weakness (generalized) (M62.81)     Time: 8119-1478 PT Time Calculation (min) (ACUTE ONLY): 51 min  Charges:  $Therapeutic Exercise: 8-22 mins $Therapeutic Activity: 8-22 mins $Massage: 8-22 mins  Rollene Rotunda Daniesha Driver, PT, DPT  Acute Rehabilitation [phone] pager #[phone] office   08/24/2018, 4:21 PM

## 2018-08-25 DIAGNOSIS — R601 Generalized edema: Secondary | ICD-10-CM

## 2018-08-25 LAB — CBC
HCT: 40.8 % (ref 39.0–52.0)
HEMOGLOBIN: 12 g/dL — AB (ref 13.0–17.0)
MCH: 28.8 pg (ref 26.0–34.0)
MCHC: 29.4 g/dL — ABNORMAL LOW (ref 30.0–36.0)
MCV: 98.1 fL (ref 80.0–100.0)
Platelets: 246 10*3/uL (ref 150–400)
RBC: 4.16 MIL/uL — ABNORMAL LOW (ref 4.22–5.81)
RDW: 17.6 % — ABNORMAL HIGH (ref 11.5–15.5)
WBC: 14.2 10*3/uL — ABNORMAL HIGH (ref 4.0–10.5)
nRBC: 0.4 % — ABNORMAL HIGH (ref 0.0–0.2)

## 2018-08-25 LAB — BASIC METABOLIC PANEL
Anion gap: 12 (ref 5–15)
BUN: 39 mg/dL — ABNORMAL HIGH (ref 6–20)
CHLORIDE: 98 mmol/L (ref 98–111)
CO2: 32 mmol/L (ref 22–32)
CREATININE: 0.63 mg/dL (ref 0.61–1.24)
Calcium: 9.9 mg/dL (ref 8.9–10.3)
GFR calc non Af Amer: >60 mL/min (ref >60)
Glucose, Bld: 108 mg/dL — ABNORMAL HIGH (ref 70–99)
Potassium: 4.6 mmol/L (ref 3.5–5.1)
SODIUM: 142 mmol/L (ref 135–145)

## 2018-08-25 LAB — GLUCOSE, CAPILLARY
GLUCOSE-CAPILLARY: 153 mg/dL — AB (ref 70–99)
Glucose-Capillary: 104 mg/dL — ABNORMAL HIGH (ref 70–99)
Glucose-Capillary: 96 mg/dL (ref 70–99)
Glucose-Capillary: 90 mg/dL (ref 70–99)

## 2018-08-25 MED ORDER — FUROSEMIDE 40 MG PO TABS
40.0000 mg | ORAL_TABLET | Freq: Two times a day (BID) | ORAL | Status: DC
  Administered 2018-08-25 – 2018-09-11 (×34): 40 mg via ORAL
  Filled 2018-08-25 (×11): qty 1
  Filled 2018-08-25: qty 2
  Filled 2018-08-25 (×24): qty 1

## 2018-08-25 NOTE — Social Work (Addendum)
5:55pm- pt given CMS ratings of all facilities covered by [facility], a copy also placed on hard shadow charts.  11:57am-Spoke with pt at bedside with pt father present via telephone, explained list from [facility] of facilities. Will bring pt CMS ratings and pt and pt family will select facilities for referral. They understand that transport outside of 50 miles will be private pay.  Pt father [facility]. [facility] with [facility] about [facility] letting them know that pt was felt to not be appropriate for the center. Pt/pt father voice understanding.   [name] GravesIsabel Roxy [name], MSW, [facility] [place] Clinical Social Work [phone]

## 2018-08-25 NOTE — Progress Notes (Signed)
PROGRESS NOTE  [name] [name] EYC:[id number] DOB: [birth year] DOA: 06/06/2018 PCP: Helane Rima, MD  HPI/Brief Narrative  [name] [name] is a 52 y.o. year old male with medical history significant for gout, hypertension, underwent anterior cervical corpectomy of C4 on 1/49/7026 with complicated postoperative course including development of multiple PEs and DVTs started on anticoagulation and subsequently developed C spinal hematoma requiring reexploration on 03/28/2018 status post placement of IVC filter then discharged to SNF who presented on 06/06/2018 is a transfer from [facility] with cervical spine MRI showing changes consistent with cord compression, cord edema and concern for ischemia.  His hospital course has been complicated by cervical decompression laminectomy, postoperative hematomas and multiple evacuations, new bilateral lower extremity DVTs, epidural hematoma (37/85, 88/50) complicating anticoagulation therapy (briefly on her gastric band and discontinued given recurrent hematomas), new PE, bone marrow biopsy for evaluation of thrombocytopenia, platelet and endplate transfusions.  Most recently he had repeat imaging of the cervical spine (MRI which neurosurgery evaluated and deemed it looked stable and recommended no further work-up  Subjective Feels like his stool is starting to become firm No complaints   Assessment/Plan: #Severe cervical spine stenosis, stable.  Stable quadriparesis, right upper extremity stronger than left upper extremity (consistent with prior exams based on chart review by neurosurgery evaluation) status post decompressive surgery complicated by multiple postoperative hematomas and evacuations.  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  Case discussed with neurosurgery who deemed slight improvement from prior films since surgery.   will need aggressive rehabilitation with SNF  #Acute PE/DVT,  stable.  Very complicated situation as patient previously had IVC filter placed (03/2018) and prior history of DVT in lower extremities for which he had previously been on anticoagulation.  He was found to have submassive PE on 11/13 with evidence of right heart strain and new DVT in the left subclavian location prompting reinitiation of anticoagulation 11/28.  He is currently on Eliquis (previously been on bivalirudin and ARC Atrovent) tolerating well with stable counts, no current oxygen requirements.  #Edema in bilateral lower extremities and upper left extremity, stable.  Suspect this is most likely combination of dependent edema and third spacing in the setting of hypoalbuminemia. With 17.3 L out since admission.  No history of CHF, anticipate improvement as patient is getting massage/muscle activation with physical therapy, frequent turnings.  Will discontinue IV Lasix and transition to oral today with close observation and weights and I/o.   #Incidental finding of pneumoperitoneum on 11/24, resolved.  Found on repeat CT abdomen.  Upper GI series on 8/25 was negative for any perforation.  Patient has remained clinically stable without nausea, vomiting or worsening abdominal pain.  Continue PPI for stress ulcer prophylaxis.  Completed a course of antibiotics during hospital stay.  #Thrombocytopenia, resolved.  Extensive work-up including bone marrow biopsy, initiation of multiple platelet transfusions with assistance from Dr. Marin Olp with no  clear etiology  #Loose stool.  Patient denies any current gout-like symptoms.  Would like to discontinue colchicine and monitor.  No other medications that could be contributing.  Denies any abdominal pain nausea or vomiting.  #Anxiety/depression, stable.  Continue scheduled Klonopin  #Type 2 diabetes.  A1c 5.8 continue Lantus 15 units, sliding scale, monitor CBG  #Hypokalemia, resolved.  Currently on maintenance dose potassium, likely setting of aggressive IV  Lasix diuresis.  Continue to monitor BMP.  #Anemia, acute blood loss anemia in setting of multiple hematoma/evacuations while on anticoagulation.  Currently stable  with hemoglobin range of 10-11.  Monitor CBC.  #Sinus bradycardia, resolved.  #Hypertension, not at goal but improving.  SBP improving currently in the 130s ( previously 023X), diastolic still  elevated mid 90s to 100.  Continue to monitor, continue PRN hydralazine as needed, if remains persistently high will initiate BP med control.  #Stage II pressure ulcer on the right ischial tuberosity, not present on admission.  Wound care consulted and following recommendations   #Severe OA of left knee.  Pain resolved.  Discontinued colchicine given loose stool.  Continue oxycodone for as needed relief, IV morphine for breakthrough. Code Status: Full code  Family Communication: No family at bedside  Disposition Plan: Medically ready for discharge, currently awaiting bed availability for SNF, no longer on IV lasix transitioned to oral on 12/13  Consultants:  Hematology/oncology, cardiology, neurosurgery, surgery  Procedures:  On 10/2 1. posterior cervical decompressive laminectomy at C3, C4, C5 with removal of the spinous process and complete laminectomy and foraminotomies of the C3, C4, C5 nerve roots. 2. Posterior cervical fusion with lateral mass screws at C3-C4-C5 and pars screws at C2 lies in the globus ellipseLateral mass screw system 3. Posterior lateral arthrodesis C2-C5 utilizing locally harvested autograft mixed with vivigen  06/22/2018: Procedure reexploration posterior cervical wound for evacuation of epidural hematoma  06/26/2018 DVT ultrasound:Right: Findings consistent with acute deep vein thrombosis involving the right common femoral vein, right femoral vein, right popliteal vein, right posterior tibial vein, and right peroneal vein. Thrombosis extends proximally into external iliac vein.  Proximal iliac vein not  visualized. Left: Findings consistent with acute deep vein thrombosis involving the left common femoral vein, left femoral vein, left popliteal vein, left posterior tibial vein, and left peroneal vein. Findings consistent with acute superficial vein thrombosis  involving the left great saphenous vein. Thrombosis extends proximally into external iliac vein. Proximal iliac vein not visualized.  07/01/18 Clot evacuation   07/02/18 RUE doppler, no DVT in right upper extremity  10/28/2019Bilateral lower extremity Doppler, acute DVT in right, acute DVT in left common femoral vein, left proximal profunda vein, left femoral vein, left popliteal vein, left posterior tibial vein, and left peroneal vein  TTE, 07/27/2018, EF 43-56%, grade 1 diastolic dysfunction  86/16/8372 bilateral lower extremity Doppler, BT bilateral saphenofemoral junctions, common femoral, femoral, popliteal, posterior tibial, and peroneal veins  08/10/2018 Left upper extremity duplex, acute DVT in left subclavian veins, left axillary vein, left brachial vein and left radial veins.  Acute superficial vein thrombosis involving the left basilar vein and left cephalic vein.   Antimicrobials: Anti-infectives (From admission, onward)   Start     Dose/Rate Route Frequency Ordered Stop   08/13/18 1000  ceFEPIme (MAXIPIME) 1 g in sodium chloride 0.9 % 100 mL IVPB     1 g 200 mL/hr over 30 Minutes Intravenous Every 8 hours 08/13/18 0949 08/14/18 2259   08/05/18 1415  vancomycin (VANCOCIN) IVPB 1000 mg/200 mL premix  Status:  Discontinued     1,000 mg 200 mL/hr over 60 Minutes Intravenous Every 8 hours 08/05/18 1410 08/08/18 1257   08/05/18 1415  ceFEPIme (MAXIPIME) 1 g in sodium chloride 0.9 % 100 mL IVPB     1 g 200 mL/hr over 30 Minutes Intravenous Every 8 hours 08/05/18 1410 08/12/18 0544   08/04/18 0645  levofloxacin (LEVAQUIN) IVPB 500 mg  Status:  Discontinued     500 mg 100 mL/hr over 60 Minutes Intravenous Every 24 hours  08/04/18 0642 08/05/18 1316   07/25/18 1700  fluconazole (DIFLUCAN) IVPB 400 mg  Status:  Discontinued     400 mg 100 mL/hr over 120 Minutes Intravenous Every 24 hours 07/25/18 1633 07/27/18 1415   07/12/18 2100  ceFAZolin (ANCEF) IVPB 2g/100 mL premix  Status:  Discontinued     2 g 200 mL/hr over 30 Minutes Intravenous Every 8 hours 07/12/18 1555 07/15/18 1050   07/10/18 2229  bacitracin 50,000 Units in sodium chloride 0.9 % 500 mL irrigation  Status:  Discontinued       As needed 07/10/18 2230 07/10/18 2300   07/07/18 1133  bacitracin 50,000 Units in sodium chloride 0.9 % 500 mL irrigation  Status:  Discontinued       As needed 07/07/18 1133 07/07/18 1357   07/06/18 0900  ceFAZolin (ANCEF) IVPB 2g/100 mL premix  Status:  Discontinued     2 g 200 mL/hr over 30 Minutes Intravenous Every 8 hours 07/06/18 0810 07/12/18 1555   07/01/18 2240  bacitracin 50,000 Units in sodium chloride 0.9 % 500 mL irrigation  Status:  Discontinued       As needed 07/01/18 2240 07/01/18 2324   06/30/18 1330  doxycycline (VIBRA-TABS) tablet 100 mg  Status:  Discontinued     100 mg Oral Every 12 hours 06/30/18 1329 07/12/18 1429   06/27/18 1000  fluconazole (DIFLUCAN) tablet 200 mg  Status:  Discontinued     200 mg Oral Daily 06/27/18 0725 07/04/18 0720   06/22/18 2315  ceFAZolin (ANCEF) IVPB 2g/100 mL premix     2 g 200 mL/hr over 30 Minutes Intravenous Every 8 hours 06/22/18 2314 06/27/18 1519   06/22/18 1828  bacitracin 50,000 Units in sodium chloride 0.9 % 500 mL irrigation  Status:  Discontinued       As needed 06/22/18 1828 06/22/18 1915   06/22/18 1700  ceFAZolin (ANCEF) 3 g in dextrose 5 % 50 mL IVPB  Status:  Discontinued     3 g 100 mL/hr over 30 Minutes Intravenous  Once 06/22/18 1647 06/22/18 2101   06/14/18 2100  ceFAZolin (ANCEF) IVPB 2g/100 mL premix     2 g 200 mL/hr over 30 Minutes Intravenous Every 8 hours 06/14/18 1712 06/17/18 0800   06/14/18 1414  bacitracin 50,000 Units in sodium  chloride 0.9 % 500 mL irrigation  Status:  Discontinued       As needed 06/14/18 1414 06/14/18 1647   06/14/18 1215  ceFAZolin (ANCEF) 3 g in dextrose 5 % 50 mL IVPB     3 g 160 mL/hr over 30 Minutes Intravenous To [facility] 06/14/18 1202 06/14/18 2225   06/14/18 1200  ceFAZolin (ANCEF) IVPB 2g/100 mL premix  Status:  Discontinued     2 g 200 mL/hr over 30 Minutes Intravenous On call to O.R. 06/14/18 1157 06/14/18 2140          DVT prophylaxis: Eliquis   Objective: Vitals:   08/25/18 0600 08/25/18 0700 08/25/18 0800 08/25/18 1200  BP:   121/80 129/88  Pulse: (!) 112 (!) 110 (!) 112 (!) 111  Resp: (!) 0 (!) _0 Temp:   98.3 F (36.8 C) 97.8 F (36.6 C)  TempSrc:   Oral Oral  SpO2: 91% 90% 93% 96%  Weight:      Height:        Intake/Output Summary (Last 24 hours) at 08/25/2018 1326 Last data filed at 08/25/2018 1200 Gross per 24 hour  Intake 482.99 ml  Output 2900 ml  Net -2417.01 ml  Filed Weights   08/19/18 0500 08/22/18 0500 08/24/18 0416  Weight: 125.9 kg 127.8 kg 128.9 kg    Exam:  Constitutional: Obese male, no distress Eyes: EOMI, anicteric, normal conjunctivae ENMT: Oropharynx with moist mucous membranes, normal dentition Cardiovascular: RRR no MRGs, nonpitting edema(improved from prior exam) most pronounced I, proximal bilateral lower extremities and left upper extremity Respiratory: Normal respiratory effort on room air, clear breath sounds on anterior chest Abdomen: Soft,non-tender, normal bowel sounds Skin: No rash ulcers, or lesions. Without skin tenting  Neurologic: Normal strength in right upper extremity (able to move against gravity), able to move left hand against gravity but no other strength throughout arm, no movement in right leg, wiggles toes in left lower extremity Psychiatric:flat affect, and mood. Mental status AAOx3  Data Reviewed: CBC: Recent Labs  Lab 08/21/18 0324 08/22/18 0930 08/23/18 1138 08/24/18 0520  08/25/18 0500  WBC 12.1* 16.0* 16.1* 13.3* 14.2*  HGB 10.9* 10.7* 11.3* 11.7* 12.0*  HCT 35.7* 36.7* 38.7* 39.2 40.8  MCV 94.9 97.1 98.0 97.8 98.1  PLT 185 208 223 225 629   Basic Metabolic Panel: Recent Labs  Lab 08/19/18 0620 08/21/18 0324 08/23/18 1138 08/24/18 0520 08/25/18 0500  NA 141 141 142 144 142  K 4.4 4.3 4.3 4.1 4.6  CL 99 98 100 103 98  CO2 32 32 30 32 32  GLUCOSE 104* 212* 144* 93 108*  BUN 15 25* 44* 41* 39*  CREATININE 0.61 0.77 0.69 0.76 0.63  CALCIUM 9.3 9.4 9.9 9.6 9.9   GFR: Estimated Creatinine Clearance: 143.6 mL/min (by C-G formula based on SCr of 0.63 mg/dL). Liver Function Tests: No results for input(s): AST, ALT, ALKPHOS, BILITOT, PROT, ALBUMIN in the last 168 hours. No results for input(s): LIPASE, AMYLASE in the last 168 hours. No results for input(s): AMMONIA in the last 168 hours. Coagulation Profile: No results for input(s): INR, PROTIME in the last 168 hours. Cardiac Enzymes: No results for input(s): CKTOTAL, CKMB, CKMBINDEX, TROPONINI in the last 168 hours. BNP (last 3 results) No results for input(s): PROBNP in the last 8760 hours. HbA1C: No results for input(s): HGBA1C in the last 72 hours. CBG: Recent Labs  Lab 08/24/18 1144 08/24/18 1709 08/24/18 2140 08/25/18 0834 08/25/18 1204  GLUCAP 103* 99 134* 104* 153*   Lipid Profile: No results for input(s): CHOL, HDL, LDLCALC, TRIG, CHOLHDL, LDLDIRECT in the last 72 hours. Thyroid Function Tests: No results for input(s): TSH, T4TOTAL, FREET4, T3FREE, THYROIDAB in the last 72 hours. Anemia Panel: No results for input(s): VITAMINB12, FOLATE, FERRITIN, TIBC, IRON, RETICCTPCT in the last 72 hours. Urine analysis:    Component Value Date/Time   COLORURINE AMBER (A) 06/07/2018 0900   APPEARANCEUR CLOUDY (A) 06/07/2018 0900   LABSPEC 1.028 06/07/2018 0900   PHURINE 5.0 06/07/2018 0900   GLUCOSEU NEGATIVE 06/07/2018 0900   HGBUR SMALL (A) 06/07/2018 0900   BILIRUBINUR NEGATIVE  06/07/2018 0900   KETONESUR 5 (A) 06/07/2018 0900   PROTEINUR 30 (A) 06/07/2018 0900   NITRITE NEGATIVE 06/07/2018 0900   LEUKOCYTESUR NEGATIVE 06/07/2018 0900   Sepsis Labs: _0 (procalcitonin:4,lacticidven:4)  )No results found for this or any previous visit (from the past 240 hour(s)).    Studies: No results found.  Scheduled Meds: . sodium chloride   Intravenous Once  . sodium chloride   Intravenous Once  . apixaban  5 mg Oral BID  . bismuth subsalicylate  30 mL Oral TID AC & HS  . Chlorhexidine Gluconate Cloth  6 each Topical Q0600  .  clonazePAM  0.5 mg Oral BID  . collagenase   Topical Daily  . famotidine  20 mg Oral Daily  . feeding supplement (ENSURE ENLIVE)  237 mL Oral TID BM  . furosemide  40 mg Intravenous Once  . furosemide  60 mg Intravenous Q12H  . gabapentin  300 mg Oral TID  . insulin aspart  0-15 Units Subcutaneous TID WC  . insulin aspart  0-5 Units Subcutaneous QHS  . insulin glargine  15 Units Subcutaneous QHS  . magic mouthwash w/lidocaine  5 mL Oral TID  . multivitamin with minerals  1 tablet Oral Daily  . potassium chloride  40 mEq Oral BID  . sodium chloride flush  10-40 mL Intracatheter Q12H    Continuous Infusions: . sodium chloride 10 mL/hr at 08/15/18 0200     LOS: 79 days     Desiree Hane, MD [facility] Pager [phone]  If 7PM-7AM, please contact night-coverage [email or web address] Password [email or web address] 08/25/2018, 1:26 PM

## 2018-08-26 LAB — BASIC METABOLIC PANEL
Anion gap: 15 (ref 5–15)
BUN: 31 mg/dL — ABNORMAL HIGH (ref 6–20)
CO2: 26 mmol/L (ref 22–32)
Calcium: 9.7 mg/dL (ref 8.9–10.3)
Chloride: 101 mmol/L (ref 98–111)
Creatinine, Ser: 0.75 mg/dL (ref 0.61–1.24)
GFR calc Af Amer: >60 mL/min (ref >60)
GFR calc non Af Amer: >60 mL/min (ref >60)
Glucose, Bld: 129 mg/dL — ABNORMAL HIGH (ref 70–99)
Potassium: 4.9 mmol/L (ref 3.5–5.1)
Sodium: 142 mmol/L (ref 135–145)

## 2018-08-26 LAB — CBC
HCT: 41.2 % (ref 39.0–52.0)
Hemoglobin: 12.4 g/dL — ABNORMAL LOW (ref 13.0–17.0)
MCH: 29.2 pg (ref 26.0–34.0)
MCHC: 30.1 g/dL (ref 30.0–36.0)
MCV: 96.9 fL (ref 80.0–100.0)
Platelets: 212 10*3/uL (ref 150–400)
RBC: 4.25 MIL/uL (ref 4.22–5.81)
RDW: 17.6 % — ABNORMAL HIGH (ref 11.5–15.5)
WBC: 15 10*3/uL — ABNORMAL HIGH (ref 4.0–10.5)
nRBC: 0.1 % (ref 0.0–0.2)

## 2018-08-26 LAB — GLUCOSE, CAPILLARY
GLUCOSE-CAPILLARY: 92 mg/dL (ref 70–99)
Glucose-Capillary: 111 mg/dL — ABNORMAL HIGH (ref 70–99)
Glucose-Capillary: 199 mg/dL — ABNORMAL HIGH (ref 70–99)

## 2018-08-26 NOTE — Progress Notes (Signed)
PROGRESS NOTE  [name] [name] NMM:[id number] DOB: Oct 02, 1965 DOA: 06/06/2018 PCP: Helane Rima, MD  HPI/Brief Narrative  [name] [name] is a 52 y.o. year old male with medical history significant for gout, hypertension, underwent anterior cervical corpectomy of C4 on 11/25/9456 with complicated postoperative course including development of multiple PEs and DVTs started on anticoagulation and subsequently developed C spinal hematoma requiring reexploration on 03/28/2018 status post placement of IVC filter then discharged to SNF who presented on 06/06/2018 is a transfer from [facility] with cervical spine MRI showing changes consistent with cord compression, cord edema and concern for ischemia.  His hospital course has been complicated by cervical decompression laminectomy, postoperative hematomas and multiple evacuations, new bilateral lower extremity DVTs, epidural hematoma (59/29, 24/46) complicating anticoagulation therapy (briefly on her gastric band and discontinued given recurrent hematomas), new PE, bone marrow biopsy for evaluation of thrombocytopenia, platelet and endplate transfusions.  Most recently he had repeat imaging of the cervical spine (MRI which neurosurgery evaluated and deemed it looked stable and recommended no further work-up  Subjective No acute events overnight Did well with diet yesterday No acute complaints today.  Assessment/Plan: #Severe cervical spine stenosis, stable.  Stable quadriparesis, right upper extremity stronger than left upper extremity (consistent with prior exams based on chart review by neurosurgery evaluation) status post decompressive surgery complicated by multiple postoperative hematomas and evacuations.  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  Case discussed with neurosurgery who deemed slight improvement from prior films since surgery.   will need aggressive rehabilitation with  SNF  #Acute PE/DVT, stable.  Very complicated situation as patient previously had IVC filter placed (03/2018) and prior history of DVT in lower extremities for which he had previously been on anticoagulation.  He was found to have submassive PE on 11/13 with evidence of right heart strain and new DVT in the left subclavian location prompting reinitiation of anticoagulation 11/28.  He is currently on Eliquis (previously been on bivalirudin and ARC Atrovent) tolerating well with stable counts, no current oxygen requirements.  #Edema in bilateral lower extremities and upper left extremity, stable.  Suspect this is most likely combination of dependent edema and third spacing in the setting of hypoalbuminemia. With 17.3 L out since admission.  No history of CHF, anticipate improvement as patient is getting massage/muscle activation with physical therapy, frequent turnings.  Will discontinue IV Lasix and transition to oral today with close observation and weights and I/o.   #Incidental finding of pneumoperitoneum on 11/24, resolved.  Found on repeat CT abdomen.  Upper GI series on 8/25 was negative for any perforation.  Patient has remained clinically stable without nausea, vomiting or worsening abdominal pain.  Continue PPI for stress ulcer prophylaxis.  Completed a course of antibiotics during hospital stay.  #Thrombocytopenia, resolved.  Extensive work-up including bone marrow biopsy, initiation of multiple platelet transfusions with assistance from Dr. Marin Olp with no  clear etiology  #Loose stool.  Patient denies any current gout-like symptoms.  Would like to discontinue colchicine and monitor.  No other medications that could be contributing.  Denies any abdominal pain nausea or vomiting.  #Anxiety/depression, stable.  Continue scheduled Klonopin  #Type 2 diabetes.  A1c 5.8 continue Lantus 15 units, sliding scale, monitor CBG  #Hypokalemia, resolved.  Currently on maintenance dose potassium, likely  setting of aggressive IV Lasix diuresis.  Continue to monitor BMP.  #Anemia, acute blood loss anemia in setting of multiple hematoma/evacuations while on anticoagulation.  Currently  stable with hemoglobin range of 10-11.  Monitor CBC.  #Sinus bradycardia, resolved.  #Hypertension, improving close to goal.  Most recent BP 120s/74.  Previously had issues with orthostatic hypotension previously treated with Midorine but discontinued on 12/4 for unclear reasons, did have episodes of bradycardia and worsening hypotension earlier in hospital stay at that thought could be contributing, it is unclear. continue to monitor, continue PRN hydralazine as needed  #Stage II pressure ulcer on the right ischial tuberosity, not present on admission.  Wound care consulted and following recommendations   #Severe OA of left knee.  Pain resolved.  Discontinued colchicine given loose stool.  Continue oxycodone for as needed relief, IV morphine for breakthrough. Code Status: Full code  Family Communication: No family at bedside  Disposition Plan: Medically ready for discharge, currently awaiting bed availability for SNF, no longer on IV lasix transitioned to oral on 12/13  Consultants:  Hematology/oncology, cardiology, neurosurgery, surgery  Procedures:  On 10/2 1. posterior cervical decompressive laminectomy at C3, C4, C5 with removal of the spinous process and complete laminectomy and foraminotomies of the C3, C4, C5 nerve roots. 2. Posterior cervical fusion with lateral mass screws at C3-C4-C5 and pars screws at C2 lies in the globus ellipseLateral mass screw system 3. Posterior lateral arthrodesis C2-C5 utilizing locally harvested autograft mixed with vivigen  06/22/2018: Procedure reexploration posterior cervical wound for evacuation of epidural hematoma  06/26/2018 DVT ultrasound:Right: Findings consistent with acute deep vein thrombosis involving the right common femoral vein, right femoral vein,  right popliteal vein, right posterior tibial vein, and right peroneal vein. Thrombosis extends proximally into external iliac vein.  Proximal iliac vein not visualized. Left: Findings consistent with acute deep vein thrombosis involving the left common femoral vein, left femoral vein, left popliteal vein, left posterior tibial vein, and left peroneal vein. Findings consistent with acute superficial vein thrombosis  involving the left great saphenous vein. Thrombosis extends proximally into external iliac vein. Proximal iliac vein not visualized.  07/01/18 Clot evacuation   07/02/18 RUE doppler, no DVT in right upper extremity  10/28/2019Bilateral lower extremity Doppler, acute DVT in right, acute DVT in left common femoral vein, left proximal profunda vein, left femoral vein, left popliteal vein, left posterior tibial vein, and left peroneal vein  TTE, 07/27/2018, EF 22-97%, grade 1 diastolic dysfunction  98/92/1194 bilateral lower extremity Doppler, BT bilateral saphenofemoral junctions, common femoral, femoral, popliteal, posterior tibial, and peroneal veins  08/10/2018 Left upper extremity duplex, acute DVT in left subclavian veins, left axillary vein, left brachial vein and left radial veins.  Acute superficial vein thrombosis involving the left basilar vein and left cephalic vein.   Antimicrobials: Anti-infectives (From admission, onward)   Start     Dose/Rate Route Frequency Ordered Stop   08/13/18 1000  ceFEPIme (MAXIPIME) 1 g in sodium chloride 0.9 % 100 mL IVPB     1 g 200 mL/hr over 30 Minutes Intravenous Every 8 hours 08/13/18 0949 08/14/18 2259   08/05/18 1415  vancomycin (VANCOCIN) IVPB 1000 mg/200 mL premix  Status:  Discontinued     1,000 mg 200 mL/hr over 60 Minutes Intravenous Every 8 hours 08/05/18 1410 08/08/18 1257   08/05/18 1415  ceFEPIme (MAXIPIME) 1 g in sodium chloride 0.9 % 100 mL IVPB     1 g 200 mL/hr over 30 Minutes Intravenous Every 8 hours 08/05/18 1410  08/12/18 0544   08/04/18 0645  levofloxacin (LEVAQUIN) IVPB 500 mg  Status:  Discontinued     500 mg 100 mL/hr  over 60 Minutes Intravenous Every 24 hours 08/04/18 0642 08/05/18 1316   07/25/18 1700  fluconazole (DIFLUCAN) IVPB 400 mg  Status:  Discontinued     400 mg 100 mL/hr over 120 Minutes Intravenous Every 24 hours 07/25/18 1633 07/27/18 1415   07/12/18 2100  ceFAZolin (ANCEF) IVPB 2g/100 mL premix  Status:  Discontinued     2 g 200 mL/hr over 30 Minutes Intravenous Every 8 hours 07/12/18 1555 07/15/18 1050   07/10/18 2229  bacitracin 50,000 Units in sodium chloride 0.9 % 500 mL irrigation  Status:  Discontinued       As needed 07/10/18 2230 07/10/18 2300   07/07/18 1133  bacitracin 50,000 Units in sodium chloride 0.9 % 500 mL irrigation  Status:  Discontinued       As needed 07/07/18 1133 07/07/18 1357   07/06/18 0900  ceFAZolin (ANCEF) IVPB 2g/100 mL premix  Status:  Discontinued     2 g 200 mL/hr over 30 Minutes Intravenous Every 8 hours 07/06/18 0810 07/12/18 1555   07/01/18 2240  bacitracin 50,000 Units in sodium chloride 0.9 % 500 mL irrigation  Status:  Discontinued       As needed 07/01/18 2240 07/01/18 2324   06/30/18 1330  doxycycline (VIBRA-TABS) tablet 100 mg  Status:  Discontinued     100 mg Oral Every 12 hours 06/30/18 1329 07/12/18 1429   06/27/18 1000  fluconazole (DIFLUCAN) tablet 200 mg  Status:  Discontinued     200 mg Oral Daily 06/27/18 0725 07/04/18 0720   06/22/18 2315  ceFAZolin (ANCEF) IVPB 2g/100 mL premix     2 g 200 mL/hr over 30 Minutes Intravenous Every 8 hours 06/22/18 2314 06/27/18 1519   06/22/18 1828  bacitracin 50,000 Units in sodium chloride 0.9 % 500 mL irrigation  Status:  Discontinued       As needed 06/22/18 1828 06/22/18 1915   06/22/18 1700  ceFAZolin (ANCEF) 3 g in dextrose 5 % 50 mL IVPB  Status:  Discontinued     3 g 100 mL/hr over 30 Minutes Intravenous  Once 06/22/18 1647 06/22/18 2101   06/14/18 2100  ceFAZolin (ANCEF) IVPB 2g/100 mL  premix     2 g 200 mL/hr over 30 Minutes Intravenous Every 8 hours 06/14/18 1712 06/17/18 0800   06/14/18 1414  bacitracin 50,000 Units in sodium chloride 0.9 % 500 mL irrigation  Status:  Discontinued       As needed 06/14/18 1414 06/14/18 1647   06/14/18 1215  ceFAZolin (ANCEF) 3 g in dextrose 5 % 50 mL IVPB     3 g 160 mL/hr over 30 Minutes Intravenous To [facility] 06/14/18 1202 06/14/18 2225   06/14/18 1200  ceFAZolin (ANCEF) IVPB 2g/100 mL premix  Status:  Discontinued     2 g 200 mL/hr over 30 Minutes Intravenous On call to O.R. 06/14/18 1157 06/14/18 2140          DVT prophylaxis: Eliquis   Objective: Vitals:   08/26/18 0321 08/26/18 0400 08/26/18 0746 08/26/18 0800  BP:  118/86 114/76 123/74  Pulse:  (!) 109 (!) 106 (!) 110  Resp: 14 (!) 0 18 15  Temp: 98.5 F (36.9 C)  97.8 F (36.6 C)   TempSrc: Oral     SpO2:  98% 98% 94%  Weight:      Height:        Intake/Output Summary (Last 24 hours) at 08/26/2018 1545 Last data filed at 08/26/2018 1300 Gross per 24 hour  Intake 320 ml  Output 1950 ml  Net -1630 ml   Filed Weights   08/19/18 0500 08/22/18 0500 08/24/18 0416  Weight: 125.9 kg 127.8 kg 128.9 kg    Exam:  Constitutional: Obese male, no distress Eyes: EOMI, anicteric, normal conjunctivae ENMT: Oropharynx with moist mucous membranes, normal dentition Cardiovascular: RRR no MRGs, nonpitting edema(improved from prior exam) most pronounced in proximal bilateral lower extremities and left upper extremity Respiratory: Normal respiratory effort on room air, clear breath sounds on anterior chest Abdomen: Soft,non-tender, normal bowel sounds Skin: No rash ulcers, or lesions. Without skin tenting  Neurologic: Normal strength in right upper extremity (able to move against gravity), able to move left hand against gravity but no other strength throughout arm, no movement in left leg, barely able to move toes on right leg Psychiatric:flat affect, and  mood. Mental status AAOx3  Data Reviewed: CBC: Recent Labs  Lab 08/22/18 0930 08/23/18 1138 08/24/18 0520 08/25/18 0500 08/26/18 1159  WBC 16.0* 16.1* 13.3* 14.2* 15.0*  HGB 10.7* 11.3* 11.7* 12.0* 12.4*  HCT 36.7* 38.7* 39.2 40.8 41.2  MCV 97.1 98.0 97.8 98.1 96.9  PLT 208 223 225 246 250   Basic Metabolic Panel: Recent Labs  Lab 08/21/18 0324 08/23/18 1138 08/24/18 0520 08/25/18 0500 08/26/18 1100  NA 141 142 144 142 142  K 4.3 4.3 4.1 4.6 4.9  CL 98 100 103 98 101  CO2 32 30 32 32 26  GLUCOSE 212* 144* 93 108* 129*  BUN 25* 44* 41* 39* 31*  CREATININE 0.77 0.69 0.76 0.63 0.75  CALCIUM 9.4 9.9 9.6 9.9 9.7   GFR: Estimated Creatinine Clearance: 143.6 mL/min (by C-G formula based on SCr of 0.75 mg/dL). Liver Function Tests: No results for input(s): AST, ALT, ALKPHOS, BILITOT, PROT, ALBUMIN in the last 168 hours. No results for input(s): LIPASE, AMYLASE in the last 168 hours. No results for input(s): AMMONIA in the last 168 hours. Coagulation Profile: No results for input(s): INR, PROTIME in the last 168 hours. Cardiac Enzymes: No results for input(s): CKTOTAL, CKMB, CKMBINDEX, TROPONINI in the last 168 hours. BNP (last 3 results) No results for input(s): PROBNP in the last 8760 hours. HbA1C: No results for input(s): HGBA1C in the last 72 hours. CBG: Recent Labs  Lab 08/25/18 0834 08/25/18 1204 08/25/18 1633 08/25/18 2154 08/26/18 0748  GLUCAP 104* 153* 96 90 92   Lipid Profile: No results for input(s): CHOL, HDL, LDLCALC, TRIG, CHOLHDL, LDLDIRECT in the last 72 hours. Thyroid Function Tests: No results for input(s): TSH, T4TOTAL, FREET4, T3FREE, THYROIDAB in the last 72 hours. Anemia Panel: No results for input(s): VITAMINB12, FOLATE, FERRITIN, TIBC, IRON, RETICCTPCT in the last 72 hours. Urine analysis:    Component Value Date/Time   COLORURINE AMBER (A) 06/07/2018 0900   APPEARANCEUR CLOUDY (A) 06/07/2018 0900   LABSPEC 1.028 06/07/2018 0900    PHURINE 5.0 06/07/2018 0900   GLUCOSEU NEGATIVE 06/07/2018 0900   HGBUR SMALL (A) 06/07/2018 0900   BILIRUBINUR NEGATIVE 06/07/2018 0900   KETONESUR 5 (A) 06/07/2018 0900   PROTEINUR 30 (A) 06/07/2018 0900   NITRITE NEGATIVE 06/07/2018 0900   LEUKOCYTESUR NEGATIVE 06/07/2018 0900   Sepsis Labs: _0 (procalcitonin:4,lacticidven:4)  )No results found for this or any previous visit (from the past 240 hour(s)).    Studies: No results found.  Scheduled Meds: . sodium chloride   Intravenous Once  . sodium chloride   Intravenous Once  . apixaban  5 mg Oral BID  . bismuth subsalicylate  30 mL  Oral TID AC & HS  . clonazePAM  0.5 mg Oral BID  . collagenase   Topical Daily  . famotidine  20 mg Oral Daily  . feeding supplement (ENSURE ENLIVE)  237 mL Oral TID BM  . furosemide  40 mg Oral BID  . gabapentin  300 mg Oral TID  . insulin aspart  0-15 Units Subcutaneous TID WC  . insulin aspart  0-5 Units Subcutaneous QHS  . insulin glargine  15 Units Subcutaneous QHS  . magic mouthwash w/lidocaine  5 mL Oral TID  . multivitamin with minerals  1 tablet Oral Daily  . potassium chloride  40 mEq Oral BID  . sodium chloride flush  10-40 mL Intracatheter Q12H    Continuous Infusions: . sodium chloride 10 mL/hr at 08/15/18 0200     LOS: 80 days     Desiree Hane, MD [facility] Pager [phone]  If 7PM-7AM, please contact night-coverage [email or web address] Password [facility] 08/26/2018, 3:45 PM

## 2018-08-27 LAB — BASIC METABOLIC PANEL
Anion gap: 12 (ref 5–15)
BUN: 25 mg/dL — AB (ref 6–20)
CALCIUM: 9.6 mg/dL (ref 8.9–10.3)
CO2: 29 mmol/L (ref 22–32)
CREATININE: 0.74 mg/dL (ref 0.61–1.24)
Chloride: 99 mmol/L (ref 98–111)
GFR calc Af Amer: >60 mL/min (ref >60)
GFR calc non Af Amer: >60 mL/min (ref >60)
Glucose, Bld: 148 mg/dL — ABNORMAL HIGH (ref 70–99)
Potassium: 4.4 mmol/L (ref 3.5–5.1)
Sodium: 140 mmol/L (ref 135–145)

## 2018-08-27 LAB — GLUCOSE, CAPILLARY: Glucose-Capillary: 141 mg/dL — ABNORMAL HIGH (ref 70–99)

## 2018-08-27 MED ORDER — POTASSIUM CHLORIDE CRYS ER 20 MEQ PO TBCR
40.0000 meq | EXTENDED_RELEASE_TABLET | Freq: Every day | ORAL | Status: DC
  Administered 2018-08-28 – 2018-09-10 (×14): 40 meq via ORAL
  Filled 2018-08-27 (×15): qty 2

## 2018-08-27 NOTE — Progress Notes (Signed)
Pt reports he has not made a decision on his SNF preferences and his family did not visit when expected to discuss it with him. CSW notes patient reported he is open to all SNF's available but stated likely to change. CSW will continue to follow.  Tenna DelaineJoey Jenalyn Girdner, LCSW, LCAS-A Clinical Social Worker II [phone]

## 2018-08-27 NOTE — Progress Notes (Addendum)
PROGRESS NOTE  [name] [name] VBT:[id number] DOB: [birth year] DOA: 06/06/2018 PCP: Helane Rima, MD  HPI/Brief Narrative  [name] [name] is a 52 y.o. year old male with medical history significant for gout, hypertension, underwent anterior cervical corpectomy of C4 on 9/77/4142 with complicated postoperative course including development of multiple PEs and DVTs started on anticoagulation and subsequently developed  spinal hematoma requiring reexploration on 03/28/2018 status post placement of IVC filter then discharged to SNF who presented on 06/06/2018 as a transfer from [facility] with cervical spine MRI showing changes consistent with cord compression, cord edema and concern for ischemia.  His hospital course has been complicated by cervical decompression laminectomy, postoperative hematomas and multiple evacuations, new bilateral lower extremity DVTs, epidural hematoma (39/53, 20/23) complicating anticoagulation therapy (briefly on argatroban and discontinued given recurrent hematomas), new PE now stable on eliquis, bone marrow biopsy for evaluation of thrombocytopenia, platelet and endplate transfusions.  Most recently he had repeat imaging of the cervical spine (MRI which neurosurgery evaluated and deemed it looked stable and recommended no further work-up  Subjective Very frustrated with care, wants to work with therapy but feels it will be of no use given weakness in his legs  Assessment/Plan: #Severe cervical spine stenosis, with quadriparesis stable.  Stable quadriparesis, right upper extremity stronger than left upper extremity (consistent with prior exams based on chart review by neurosurgery evaluation) status post decompressive surgery complicated by multiple postoperative hematomas and evacuations.  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  Case discussed with neurosurgery who deemed slight improvement from prior films  since surgery, no further plans given neurologically stable and very high risk for further bleeding in the cervical area per NSG.   will need aggressive rehabilitation with SNF(previously considered [facility]  #Acute PE/DVT in patient with history of PE and DVT, stable.  Has IVC filter (03/2018).  Has had multiple events of postoperative epidural hematomas which led to holding his home anticoagulation, found to have acute DVT in lower extremities on 10/14 while holding anticoagulation, started on bivalirudin 11/13 for new submassive PE, found to have DVTs in left upper extremities on that treatment so switched to argatroban 11/29 (per hematology), back on his home regimen of Eliquis since 12/4. Stable hemoglobin on CBC, no current oxygen requirements on room air.  No signs or symptoms of bleeding.    #Edema in bilateral lower extremities and upper left extremity, stable.  Suspect this is most likely combination of dependent edema and third spacing in the setting of hypoalbuminemia. With 17.3 L out since admission.  No history of CHF, anticipate improvement as patient is getting massage/muscle activation with physical therapy, frequent turnings.  Doing well on oral Lasix therapy.  Continue scheduled potassium, monitor BMP I's and O's, daily weight  #Incidental finding of pneumoperitoneum on 11/23, resolved.  Free air on abdominal imaging on 11/21,  abd xr and repeat CT abdomen cited pneumoperitoneum.  Upper GI series on 11/25 was negative for any perforation.  Patient has remained clinically stable without nausea, vomiting or worsening abdominal pain.  Continue PPI for stress ulcer prophylaxis.  Completed a course of antibiotics during hospital stay.  #Thrombocytopenia, resolved.  Extensive work-up including bone marrow biopsy, initiation of multiple platelet transfusions with assistance from Dr. Marin Olp with no  clear etiology.  Briefly required Argatroban drip due to concern for possible  HIT (HIT panel was mildly elevated at 0.486, serotonin release assay was negative), he was then transitioned  to bivalirudin because of bradycardia (10/23) that resolved with discontinuation of our argatroban  #Orthostatic hypotension with near syncope, improving likely multifactorial related to hypovolemia from earlier hospital stay and autonomic dysfunction.  TTE shows grade 2 diastolic dysfunction currently not volume overloaded.  Previously on Midrin which was decreased due to bradycardia and discontinued on 12/4.  Blood pressure normal, unclear orthostatic as patient unable to stand, no changes in blood pressure when he sits.  #Loose stool.  Patient denies any current gout-like symptoms.  Would like to discontinue colchicine and monitor.  No other medications that could be contributing.  Denies any abdominal pain nausea or vomiting.  #Anxiety/depression, stable.  Continue scheduled Klonopin  #Type 2 diabetes.  A1c 5.8 continue Lantus 15 units, sliding scale, monitor CBG  #Hypokalemia, resolved.  Currently on maintenance dose potassium, likely setting of aggressive IV Lasix diuresis.  Continue to monitor BMP.  #Anemia, acute blood loss anemia in setting of multiple hematoma/evacuations while on anticoagulation.  Currently stable with hemoglobin range of 10-11.  Monitor CBC.  #Sinus bradycardia, resolved.  Thought to be secondary to argatroban drip, cardiology was consulted (10/23)and recommended discontinuation  drip, heart rate improved afterwards.  Additionally Midrin was also felt to be contributing, since discontinued on 12/4.  #Hypertension, improving close to goal.  Most recent BP 132/73 . continue to monitor, continue PRN hydralazine as needed  #Stage II pressure ulcer on the right ischial tuberosity, not present on admission.  Wound care consulted and following recommendations   #Severe OA of left knee.  Pain resolved.  Discontinued colchicine given loose stool.  Continue oxycodone for  as needed relief, IV morphine for breakthrough.  Code Status: Full code  Family Communication: No family at bedside  Disposition Plan: Medically ready for discharge, currently awaiting bed availability for SNF, no longer on IV lasix transitioned to oral on 12/13  Consultants:  Hematology/oncology, cardiology, neurosurgery, surgery  Procedures:  On 10/2 1. posterior cervical decompressive laminectomy at C3, C4, C5 with removal of the spinous process and complete laminectomy and foraminotomies of the C3, C4, C5 nerve roots. 2. Posterior cervical fusion with lateral mass screws at C3-C4-C5 and pars screws at C2 lies in the globus ellipseLateral mass screw system 3. Posterior lateral arthrodesis C2-C5 utilizing locally harvested autograft mixed with vivigen  06/22/2018:(Preceded by symptoms of worsening left-sided paresthesia weakness, MRI noted postoperative hematoma) Procedure reexploration posterior cervical wound for evacuation of epidural hematoma  06/26/2018 DVT ultrasound:Right: Findings consistent with acute deep vein thrombosis involving the right common femoral vein, right femoral vein, right popliteal vein, right posterior tibial vein, and right peroneal vein. Thrombosis extends proximally into external iliac vein.  Proximal iliac vein not visualized. Left: Findings consistent with acute deep vein thrombosis involving the left common femoral vein, left femoral vein, left popliteal vein, left posterior tibial vein, and left peroneal vein. Findings consistent with acute superficial vein thrombosis  involving the left great saphenous vein. Thrombosis extends proximally into external iliac vein. Proximal iliac vein not visualized.  07/01/18 (symptoms of decreased left hand grip, MRI showed large epidural hematoma) requiring operative clot evacuation   07/02/18 RUE doppler, no DVT in right upper extremity  10/28/2019Bilateral lower extremity Doppler, acute DVT in right, acute  DVT in left common femoral vein, left proximal profunda vein, left femoral vein, left popliteal vein, left posterior tibial vein, and left peroneal vein  TTE, 07/27/2018, EF 76-19%, grade 1 diastolic dysfunction  50/93/2671 bilateral lower extremity Doppler, DVT in bilateral saphenofemoral junctions, common femoral, femoral,  popliteal, posterior tibial, and peroneal veins  08/10/2018 Left upper extremity duplex, acute DVT in left subclavian veins, left axillary vein, left brachial vein and left radial veins.  Acute superficial vein thrombosis involving the left basilar vein and left cephalic vein.   Antimicrobials: Anti-infectives (From admission, onward)   Start     Dose/Rate Route Frequency Ordered Stop   08/13/18 1000  ceFEPIme (MAXIPIME) 1 g in sodium chloride 0.9 % 100 mL IVPB     1 g 200 mL/hr over 30 Minutes Intravenous Every 8 hours 08/13/18 0949 08/14/18 2259   08/05/18 1415  vancomycin (VANCOCIN) IVPB 1000 mg/200 mL premix  Status:  Discontinued     1,000 mg 200 mL/hr over 60 Minutes Intravenous Every 8 hours 08/05/18 1410 08/08/18 1257   08/05/18 1415  ceFEPIme (MAXIPIME) 1 g in sodium chloride 0.9 % 100 mL IVPB     1 g 200 mL/hr over 30 Minutes Intravenous Every 8 hours 08/05/18 1410 08/12/18 0544   08/04/18 0645  levofloxacin (LEVAQUIN) IVPB 500 mg  Status:  Discontinued     500 mg 100 mL/hr over 60 Minutes Intravenous Every 24 hours 08/04/18 0642 08/05/18 1316   07/25/18 1700  fluconazole (DIFLUCAN) IVPB 400 mg  Status:  Discontinued     400 mg 100 mL/hr over 120 Minutes Intravenous Every 24 hours 07/25/18 1633 07/27/18 1415   07/12/18 2100  ceFAZolin (ANCEF) IVPB 2g/100 mL premix  Status:  Discontinued     2 g 200 mL/hr over 30 Minutes Intravenous Every 8 hours 07/12/18 1555 07/15/18 1050   07/10/18 2229  bacitracin 50,000 Units in sodium chloride 0.9 % 500 mL irrigation  Status:  Discontinued       As needed 07/10/18 2230 07/10/18 2300   07/07/18 1133  bacitracin  50,000 Units in sodium chloride 0.9 % 500 mL irrigation  Status:  Discontinued       As needed 07/07/18 1133 07/07/18 1357   07/06/18 0900  ceFAZolin (ANCEF) IVPB 2g/100 mL premix  Status:  Discontinued     2 g 200 mL/hr over 30 Minutes Intravenous Every 8 hours 07/06/18 0810 07/12/18 1555   07/01/18 2240  bacitracin 50,000 Units in sodium chloride 0.9 % 500 mL irrigation  Status:  Discontinued       As needed 07/01/18 2240 07/01/18 2324   06/30/18 1330  doxycycline (VIBRA-TABS) tablet 100 mg  Status:  Discontinued     100 mg Oral Every 12 hours 06/30/18 1329 07/12/18 1429   06/27/18 1000  fluconazole (DIFLUCAN) tablet 200 mg  Status:  Discontinued     200 mg Oral Daily 06/27/18 0725 07/04/18 0720   06/22/18 2315  ceFAZolin (ANCEF) IVPB 2g/100 mL premix     2 g 200 mL/hr over 30 Minutes Intravenous Every 8 hours 06/22/18 2314 06/27/18 1519   06/22/18 1828  bacitracin 50,000 Units in sodium chloride 0.9 % 500 mL irrigation  Status:  Discontinued       As needed 06/22/18 1828 06/22/18 1915   06/22/18 1700  ceFAZolin (ANCEF) 3 g in dextrose 5 % 50 mL IVPB  Status:  Discontinued     3 g 100 mL/hr over 30 Minutes Intravenous  Once 06/22/18 1647 06/22/18 2101   06/14/18 2100  ceFAZolin (ANCEF) IVPB 2g/100 mL premix     2 g 200 mL/hr over 30 Minutes Intravenous Every 8 hours 06/14/18 1712 06/17/18 0800   06/14/18 1414  bacitracin 50,000 Units in sodium chloride 0.9 % 500 mL irrigation  Status:  Discontinued       As needed 06/14/18 1414 06/14/18 1647   06/14/18 1215  ceFAZolin (ANCEF) 3 g in dextrose 5 % 50 mL IVPB     3 g 160 mL/hr over 30 Minutes Intravenous To [facility] 06/14/18 1202 06/14/18 2225   06/14/18 1200  ceFAZolin (ANCEF) IVPB 2g/100 mL premix  Status:  Discontinued     2 g 200 mL/hr over 30 Minutes Intravenous On call to O.R. 06/14/18 1157 06/14/18 2140          DVT prophylaxis: Eliquis   Objective: Vitals:   08/27/18 0424 08/27/18 0500 08/27/18 0700  08/27/18 1100  BP: 132/87  (!) 132/91 134/89  Pulse: (!) 117     Resp: 20  20   Temp: 99.1 F (37.3 C)  100.1 F (37.8 C) 98.5 F (36.9 C)  TempSrc: Oral  Oral Oral  SpO2: 97%  97% 98%  Weight:  124.8 kg    Height:        Intake/Output Summary (Last 24 hours) at 08/27/2018 1422 Last data filed at 08/27/2018 1100 Gross per 24 hour  Intake 840 ml  Output 1900 ml  Net -1060 ml   Filed Weights   08/22/18 0500 08/24/18 0416 08/27/18 0500  Weight: 127.8 kg 128.9 kg 124.8 kg    Exam:  Constitutional: Obese male, no distress Eyes: EOMI, anicteric, normal conjunctivae ENMT: Oropharynx with moist mucous membranes, normal dentition, soft cervical collar in place Cardiovascular: RRR no MRGs, nonpitting edema(improved from prior exam) most pronounced in proximal bilateral lower extremities and left upper extremity Respiratory: Normal respiratory effort on room air, clear breath sounds on anterior chest Abdomen: Soft,non-tender, normal bowel sounds Skin: No rash ulcers, or lesions. Without skin tenting  Neurologic: 3/5 strength in right upper extremity (able to move against gravity), able to move left hand against gravity but no other strength throughout arm, no movement in left leg, barely able to move toes on right leg Psychiatric:flat affect, and anxious/frustrated mood. Mental status AAOx3  Data Reviewed: CBC: Recent Labs  Lab 08/22/18 0930 08/23/18 1138 08/24/18 0520 08/25/18 0500 08/26/18 1159  WBC 16.0* 16.1* 13.3* 14.2* 15.0*  HGB 10.7* 11.3* 11.7* 12.0* 12.4*  HCT 36.7* 38.7* 39.2 40.8 41.2  MCV 97.1 98.0 97.8 98.1 96.9  PLT 208 223 225 246 614   Basic Metabolic Panel: Recent Labs  Lab 08/21/18 0324 08/23/18 1138 08/24/18 0520 08/25/18 0500 08/26/18 1100  NA 141 142 144 142 142  K 4.3 4.3 4.1 4.6 4.9  CL 98 100 103 98 101  CO2 32 30 32 32 26  GLUCOSE 212* 144* 93 108* 129*  BUN 25* 44* 41* 39* 31*  CREATININE 0.77 0.69 0.76 0.63 0.75  CALCIUM 9.4 9.9 9.6  9.9 9.7   GFR: Estimated Creatinine Clearance: 141 mL/min (by C-G formula based on SCr of 0.75 mg/dL). Liver Function Tests: No results for input(s): AST, ALT, ALKPHOS, BILITOT, PROT, ALBUMIN in the last 168 hours. No results for input(s): LIPASE, AMYLASE in the last 168 hours. No results for input(s): AMMONIA in the last 168 hours. Coagulation Profile: No results for input(s): INR, PROTIME in the last 168 hours. Cardiac Enzymes: No results for input(s): CKTOTAL, CKMB, CKMBINDEX, TROPONINI in the last 168 hours. BNP (last 3 results) No results for input(s): PROBNP in the last 8760 hours. HbA1C: No results for input(s): HGBA1C in the last 72 hours. CBG: Recent Labs  Lab 08/25/18 2154 08/26/18 4315 08/26/18 1612 08/26/18 2126 08/27/18 4008  GLUCAP 90 92 111* 199* 141*   Lipid Profile: No results for input(s): CHOL, HDL, LDLCALC, TRIG, CHOLHDL, LDLDIRECT in the last 72 hours. Thyroid Function Tests: No results for input(s): TSH, T4TOTAL, FREET4, T3FREE, THYROIDAB in the last 72 hours. Anemia Panel: No results for input(s): VITAMINB12, FOLATE, FERRITIN, TIBC, IRON, RETICCTPCT in the last 72 hours. Urine analysis:    Component Value Date/Time   COLORURINE AMBER (A) 06/07/2018 0900   APPEARANCEUR CLOUDY (A) 06/07/2018 0900   LABSPEC 1.028 06/07/2018 0900   PHURINE 5.0 06/07/2018 0900   GLUCOSEU NEGATIVE 06/07/2018 0900   HGBUR SMALL (A) 06/07/2018 0900   BILIRUBINUR NEGATIVE 06/07/2018 0900   KETONESUR 5 (A) 06/07/2018 0900   PROTEINUR 30 (A) 06/07/2018 0900   NITRITE NEGATIVE 06/07/2018 0900   LEUKOCYTESUR NEGATIVE 06/07/2018 0900   Sepsis Labs: '@LABRCNTIP' (procalcitonin:4,lacticidven:4)  )No results found for this or any previous visit (from the past 240 hour(s)).    Studies: No results found.  Scheduled Meds: . sodium chloride   Intravenous Once  . sodium chloride   Intravenous Once  . apixaban  5 mg Oral BID  . bismuth subsalicylate  30 mL Oral TID AC & HS  .  clonazePAM  0.5 mg Oral BID  . famotidine  20 mg Oral Daily  . feeding supplement (ENSURE ENLIVE)  237 mL Oral TID BM  . furosemide  40 mg Oral BID  . gabapentin  300 mg Oral TID  . insulin aspart  0-15 Units Subcutaneous TID WC  . insulin aspart  0-5 Units Subcutaneous QHS  . insulin glargine  15 Units Subcutaneous QHS  . magic mouthwash w/lidocaine  5 mL Oral TID  . multivitamin with minerals  1 tablet Oral Daily  . [START ON 08/28/2018] potassium chloride  40 mEq Oral Daily  . sodium chloride flush  10-40 mL Intracatheter Q12H    Continuous Infusions: . sodium chloride 10 mL/hr at 08/15/18 0200     LOS: 81 days     Desiree Hane, MD [facility] Pager [phone]  If 7PM-7AM, please contact night-coverage [email or web address] Password [email or web address] 08/27/2018, 2:22 PM

## 2018-08-28 ENCOUNTER — Inpatient Hospital Stay (HOSPITAL_COMMUNITY)

## 2018-08-28 DIAGNOSIS — R6510 Systemic inflammatory response syndrome (SIRS) of non-infectious origin without acute organ dysfunction: Secondary | ICD-10-CM

## 2018-08-28 LAB — BASIC METABOLIC PANEL
Anion gap: 13 (ref 5–15)
BUN: 22 mg/dL — ABNORMAL HIGH (ref 6–20)
CHLORIDE: 95 mmol/L — AB (ref 98–111)
CO2: 31 mmol/L (ref 22–32)
Calcium: 9.5 mg/dL (ref 8.9–10.3)
Creatinine, Ser: 0.72 mg/dL (ref 0.61–1.24)
GFR calc Af Amer: >60 mL/min (ref >60)
GFR calc non Af Amer: >60 mL/min (ref >60)
Glucose, Bld: 128 mg/dL — ABNORMAL HIGH (ref 70–99)
Potassium: 3.5 mmol/L (ref 3.5–5.1)
Sodium: 139 mmol/L (ref 135–145)

## 2018-08-28 LAB — GLUCOSE, CAPILLARY
GLUCOSE-CAPILLARY: 110 mg/dL — AB (ref 70–99)
GLUCOSE-CAPILLARY: 106 mg/dL — AB (ref 70–99)
Glucose-Capillary: 113 mg/dL — ABNORMAL HIGH (ref 70–99)
Glucose-Capillary: 112 mg/dL — ABNORMAL HIGH (ref 70–99)
Glucose-Capillary: 120 mg/dL — ABNORMAL HIGH (ref 70–99)
Glucose-Capillary: 106 mg/dL — ABNORMAL HIGH (ref 70–99)
Glucose-Capillary: 104 mg/dL — ABNORMAL HIGH (ref 70–99)

## 2018-08-28 LAB — CBC
HCT: 38.8 % — ABNORMAL LOW (ref 39.0–52.0)
Hemoglobin: 11.5 g/dL — ABNORMAL LOW (ref 13.0–17.0)
MCH: 28.6 pg (ref 26.0–34.0)
MCHC: 29.6 g/dL — ABNORMAL LOW (ref 30.0–36.0)
MCV: 96.5 fL (ref 80.0–100.0)
Platelets: 180 10*3/uL (ref 150–400)
RBC: 4.02 MIL/uL — ABNORMAL LOW (ref 4.22–5.81)
RDW: 17.5 % — ABNORMAL HIGH (ref 11.5–15.5)
WBC: 16 10*3/uL — ABNORMAL HIGH (ref 4.0–10.5)
nRBC: 0.1 % (ref 0.0–0.2)

## 2018-08-28 MED ORDER — ORAL CARE MOUTH RINSE
15.0000 mL | Freq: Two times a day (BID) | OROMUCOSAL | Status: DC
  Administered 2018-08-29 – 2018-09-10 (×16): 15 mL via OROMUCOSAL

## 2018-08-28 NOTE — Progress Notes (Signed)
Occupational Therapy Treatment Patient Details Name: [name] [name] MRN: [id number] DOB: 06/17/1966 Today's Date: 08/28/2018    History of present illness 52 y.o. male admitted on 06/06/18 for bil LE weakness and muscle spasm after multiple falls (with resultant R knee meniscus tear placed in bledsoe brace) following a recent ACDF at C3 in July 2019 (complicated by PE (s/p IVC filter), CIR, and then SNF placement before finally returning home with his parents).  In the ED MRI of c-spine which showed post operative changes and cord edema and possibility of ischemia.  His R knee was aspirated on 06/09/18.  S/p posterior cervical decompression and fusion on 06/14/18. Entire acute course complicated by hypotension in standing (thought to be autonomic) and by finding of multiple LE DVTs (already has an IVC filter in place, but per MDs notes at risk for blocking the filter with these clots).  Pt with post op issues with incisional bleeding s/p 2 evacuations of hematomas and placement of hemovac (06/22/18 and 07/01/18) as well as multiple units of plasma.  Cardiology consulted 07/05/18 due to bradycardia thought to be from his newly started blood thinner.  Pt with another re-accumulation of epidural hematoma with increased L sided weakness and cord compression with emergent evacuation surgery 10/25 & 07/10/18.  Pt also needing more platelets post op.  Bone marrow bx 07/12/18.  Pt found to have submassive PE on 07/27/18 and re-started on anticoagulation. PMHx of HTN, gout, anemia, DDD (lumbar).  08/10/18 found to have a L UE thrombus (same arm as his PICC line).     OT comments  Pt seen for OT session, friend present and reports pt has been sleeping most of the day. Pt also with some increased confusion during session including increased trouble problem solving. Pt sat EOB during this session with two person assist though with decreased sitting tolerance compared to previous sessions due to increased pain both in  buttocks region as well as in L ankle. Feel POC remains appropriate at this time. Will continue to follow acutely to progress pt towards established OT goals.    Follow Up Recommendations  SNF;Supervision/Assistance - 24 hour    Equipment Recommendations  Other (comment)(TBD in next venue)          Precautions / Restrictions Precautions Precautions: Fall;Cervical;Other (comment) Precaution Comments: Several bed sores on bottom.   Required Braces or Orthoses: Cervical Brace Cervical Brace: Soft collar(PRN for comfort) Other Brace: condom cath and bil PRAFOs Restrictions Weight Bearing Restrictions: No       Mobility Bed Mobility Overal bed mobility: Needs Assistance Bed Mobility: Rolling;Sidelying to Sit;Sit to Sidelying Rolling: +2 for physical assistance;Total assist Sidelying to sit: +2 for physical assistance;Total assist     Sit to sidelying: +2 for physical assistance;Total assist General bed mobility comments: Two to three person total assist for rolling, transition to sitting from side lying and returning back to bed from sitting->sidelying->supine.  Three people used as we had that many available to make his transitions less scary.  He is able to get EOB typically with two.  Transfers                      Balance Overall balance assessment: Needs assistance Sitting-balance support: Feet supported;No upper extremity supported;Bilateral upper extremity supported;Single extremity supported Sitting balance-Leahy Scale: Zero Sitting balance - Comments: total assist EOB today, pt immediately reporting pain in left ankle and left buttocks once seated EOB.  More support needed at trunk and pt unable to  relax due to pain.  Therapists attempted repositioning and changing the amount of air in the airmattress, but he was unable to continue.  We sat up for ~5-8 mins before repositioning in supine rotated to the right.  He was able to preform cervical ROM in sitting with  gentle PT overpressure and massage to his opposing upper trap.  We were unable to do our usual round of scap retractions, seated weight shifts onto bil elbows, etc. today due to pain in sitting.                                    ADL either performed or assessed with clinical judgement   ADL                                       Functional mobility during ADLs: Total assistance;+2 for physical assistance;+2 for safety/equipment General ADL Comments: pt with increased pain this session and not able to tolerate sitting EOB as long as he has been able to in previous sessions;     Vision       Perception     Praxis      Cognition Arousal/Alertness: Lethargic(a shade lethargic) Behavior During Therapy: WFL for tasks assessed/performed Overall Cognitive Status: Impaired/Different from baseline Area of Impairment: Orientation;Memory                 Orientation Level: Disoriented to;Time   Memory: Decreased short-term memory         General Comments: Pt a bit confused on day of the week and date.  Even when given the date, he could not problem solve how many days away his birthday was from today (seven days).  Lights left on at the end of the session as RN and firend in room report he has been sleeping all day.  Attempting to re orient to decrease delirium.         Exercises Exercises: General Lower Extremity;General Upper Extremity General Exercises - Upper Extremity Shoulder Flexion: PROM;Left(0-90*) Shoulder Horizontal ABduction: PROM;Left;Supine Shoulder Horizontal ADduction: PROM;Left;Supine Elbow Flexion: PROM;Left;Supine General Exercises - Lower Extremity Ankle Circles/Pumps: PROM;Both;10 reps Heel Slides: PROM;Both;10 reps Hip ABduction/ADduction: PROM;Both;10 reps Other Exercises Other Exercises: hip IR bil x 10 reps   Shoulder Instructions       General Comments Pt was positioned on his left side when PT/OT entered the  room, repositioned on his right side when we exited with PRAFO boots re-donned and an ice pack to L ankle for pain relief. RN in room at end of session administering pain meds.  Pt's BP stable when checked prior to mobility.     Pertinent Vitals/ Pain       Pain Assessment: Faces Faces Pain Scale: Hurts whole lot Pain Location: Left buttocks and L ankle, L shoulder Pain Descriptors / Indicators: Grimacing;Guarding Pain Intervention(s): Limited activity within patient's tolerance;Monitored during session;Repositioned;Patient requesting pain meds-RN notified;Ice applied  Home Living                                          Prior Functioning/Environment              Frequency  Min 2X/week        Progress Toward  Goals  OT Goals(current goals can now be found in the care plan section)  Progress towards OT goals: Progressing toward goals  Acute Rehab OT Goals Patient Stated Goal: to have improvements in his strength so he can start standing again. OT Goal Formulation: With patient Time For Goal Achievement: 09/11/18 Potential to Achieve Goals: Fair  Plan Discharge plan remains appropriate    Co-evaluation    PT/OT/SLP Co-Evaluation/Treatment: Yes Reason for Co-Treatment: Complexity of the patient's impairments (multi-system involvement);For patient/therapist safety;To address functional/ADL transfers PT goals addressed during session: Mobility/safety with mobility;Balance;Strengthening/ROM OT goals addressed during session: Strengthening/ROM      AM-PAC OT "6 Clicks" Daily Activity     Outcome Measure   Help from another person eating meals?: A Little Help from another person taking care of personal grooming?: A Little Help from another person toileting, which includes using toliet, bedpan, or urinal?: Total Help from another person bathing (including washing, rinsing, drying)?: A Lot Help from another person to put on and taking off regular upper  body clothing?: Total Help from another person to put on and taking off regular lower body clothing?: Total 6 Click Score: 11    End of Session    OT Visit Diagnosis: Other abnormalities of gait and mobility (R26.89);Muscle weakness (generalized) (M62.81)   Activity Tolerance Patient limited by pain   Patient Left in bed;with call bell/phone within reach;with family/visitor present   Nurse Communication Mobility status        Time: 1610-9604 OT Time Calculation (min): 43 min  Charges: OT General Charges $OT Visit: 1 Visit OT Treatments $Therapeutic Activity: 8-22 mins  Marcy Siren, OT [facility] Pager [phone] Office [phone]    Orlando Penner 08/28/2018, 4:26 PM

## 2018-08-28 NOTE — Progress Notes (Signed)
Nutrition Follow-up  DOCUMENTATION CODES:   Morbid obesity  INTERVENTION:  Continue Ensure Enlive po TID, each supplement provides 350 kcal and 20 grams of protein.  Encourage adequate PO intake.   NUTRITION DIAGNOSIS:   Increased nutrient needs related to wound healing, post-op healing as evidenced by estimated needs.; ongoing  GOAL:   Patient will meet greater than or equal to 90% of their needs; progressing  MONITOR:   PO intake, Supplement acceptance, Labs, Weight trends, Skin, I & O's  REASON FOR ASSESSMENT:   Low Braden    ASSESSMENT:   [name] [name] is a 52 y.o. male with history of hypertension who has had a recent ACDF at C3 C5 by Dr. Wynetta Emeryram in July 2019 complicated by pulmonary embolism was placed on apixaban subsequently discharged to rehab and patient had moved to [place] area to [place] and has been living with his parents.  Had gone to ER around August 27 for complaints of increasing weakness and at the time was found to be febrile and was admitted for possible pneumonia.  He started antibiotic as per the ER physician with whom I discussed.  Patient was eventually discharged to rehab and patient was discharged from rehab last week.  [facility] for his known DVT and on reaching back home in [place] patient on trying to get out of the car started finding it difficult to walk and his legs gave away.  He also states he inadvertently turned his neck.  Following which patient has been having increasing weakness difficult to walk.  Prior to which patient was able to walk up the stairs.  Denies any incontinence of urine or bowel.  Neck pain has been chronic.  Denies any chest pain or shortness of breath.  Meal completion 100% at breakfast this AM.  Pt has been tolerating his diet. Pt currently has Ensure ordered and has been consuming them. RD to continue with current orders to aid in caloric and protein needs as  well as in wound healing. Quadriparesis ongoing however improving. Pt continues on oral lasix. Labs and medications reviewed.   Diet Order:   Diet Order            Diet regular Room service appropriate? Yes; Fluid consistency: Thin  Diet effective now              EDUCATION NEEDS:   Education needs have been addressed  Skin:  Skin Assessment: Skin Integrity Issues: Skin Integrity Issues:: Stage II, Stage III Stage II: R ischial tuberosity, sacrum Stage III: L ischial Incisions: N/A  Last BM:  12/16  Height:   Ht Readings from Last 1 Encounters:  06/22/18 5\' 9"  (1.753 m)    Weight:   Wt Readings from Last 1 Encounters:  08/27/18 124.8 kg    Ideal Body Weight:  72.7 kg  BMI:  Body mass index is 40.63 kg/m.  Estimated Nutritional Needs:   Kcal:  2100-2300  Protein:  145-160 grams  Fluid:  > 2.1 L   Roslyn SmilingStephanie Itzamar Traynor, MS, RD, LDN Pager # [phone] After hours/ weekend pager # [phone]

## 2018-08-28 NOTE — Progress Notes (Addendum)
PROGRESS NOTE  Cullin Dishman SWF:[id number] DOB: [birth year] DOA: 06/06/2018 PCP: Helane Rima, MD  HPI/Brief Narrative  Charle Clear is a 52 y.o. year old male with medical history significant for gout, hypertension, underwent anterior cervical corpectomy of C4 on 11/02/2540 with complicated postoperative course including development of multiple PEs and DVTs started on anticoagulation and subsequently developed  spinal hematoma requiring reexploration on 03/28/2018 status post placement of IVC filter then discharged to SNF who presented on 06/06/2018 as a transfer from [facility] with cervical spine MRI showing changes consistent with cord compression, cord edema and concern for ischemia.  His hospital course has been complicated by cervical decompression laminectomy, postoperative hematomas and multiple evacuations, new bilateral lower extremity DVTs, epidural hematoma (70/62, 37/62) complicating anticoagulation therapy (briefly on argatroban and discontinued given recurrent hematomas), new PE now stable on eliquis, bone marrow biopsy for evaluation of thrombocytopenia, platelet and endplate transfusions.  Cervical spine 08/19/18 MRI which neurosurgery evaluated and deemed it looked stable and recommended no further work-up  Subjective Overnight reports of increased right hand tremor. Evaluation found to be tachycardic to 130s with normal blood pressure, and temp of 100.5 with normal glucose.  Again had fever of 102.4 11 PM On my assessment this morning patient at baseline mental status, denies any cough, chest pain, abdominal pain. Rectal tube was removed on 12/15.  Had 2 loose stool since then No neurologic deficits reported  Assessment/Plan:  #Sirs.  T-max of 102.5 overnight and leukocytosis of 16 (has had leukocytosis throughout hospital stay, most recent white count was 15 on 12/15), has had stable sinus tachycardia throughout hospital stay as well.obtain blood cultures.   Continue to monitor CBC and for localizing symptoms of infection.  Chest x-ray shows atelectasis with no signs of pneumonia.  Does have  PE/DVT which can cause fevers but I would not expect this far out.  Continue to monitor with low threshold to initiate antibiotics.  #Severe cervical spine stenosis, with quadriparesis stable.  Stable quadriparesis, right upper extremity stronger than left upper extremity (consistent with prior exams based on chart review by neurosurgery evaluation) status post decompressive surgery complicated by multiple postoperative hematomas and evacuations.  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  Case discussed with neurosurgery who deemed slight improvement from prior films since surgery, no further plans for surgery given neurologically stable and very high risk for further bleeding in the cervical area per NSG.   will need aggressive rehabilitation with SNF(previously considered for [facility]), social worker assisting, currently looking into spine rehab center with the [place].  #Acute PE/DVT in patient with history of PE and DVT, stable.  Has IVC filter (03/2018).  Has had multiple events of postoperative epidural hematomas which led to holding his home anticoagulation, found to have acute DVT in lower extremities on 10/14 while holding anticoagulation, started on bivalirudin 11/13 for new submassive PE, found to have DVTs in left upper extremities on that treatment so switched to argatroban 11/29 (per hematology), back on his home regimen of Eliquis since 12/4. Stable hemoglobin on CBC, no current oxygen requirements on room air.  No signs or symptoms of bleeding.    #Edema in bilateral lower extremities and upper left extremity, stable.  Suspect this is most likely combination of dependent edema and third spacing in the setting of hypoalbuminemia. With 17.3 L out since admission.  No history of CHF, anticipate  improvement as patient is getting massage/muscle activation with physical  therapy, frequent turnings.  Doing well on oral Lasix therapy.  Continue scheduled potassium, monitor BMP I's and O's, daily weight  #Incidental finding of pneumoperitoneum on 11/23, resolved.  Free air on abdominal imaging on 11/21,  abd xr and repeat CT abdomen cited pneumoperitoneum.  Upper GI series on 11/25 was negative for any perforation.  Patient has remained clinically stable without nausea, vomiting or worsening abdominal pain.  Continue PPI for stress ulcer prophylaxis.  Completed a course of antibiotics during hospital stay.  #Thrombocytopenia, resolved.  Extensive work-up including bone marrow biopsy, initiation of multiple platelet transfusions with assistance from Dr. Marin Olp with no  clear etiology.  Briefly required Argatroban drip due to concern for possible HIT (HIT panel was mildly elevated at 0.486, serotonin release assay was negative), he was then transitioned to bivalirudin because of bradycardia (10/23) that resolved with discontinuation of our argatroban  #Orthostatic hypotension with near syncope, improving likely multifactorial related to hypovolemia from earlier hospital stay and autonomic dysfunction.  TTE shows grade 2 diastolic dysfunction currently not volume overloaded.  Previously on Midrin which was decreased due to bradycardia and discontinued on 12/4.  Blood pressure normal, unclear orthostatic as patient unable to stand, no changes in blood pressure when he sits.  #Loose stool.  Patient denies any current gout-like symptoms.  Would like to discontinue colchicine and monitor.  No other medications that could be contributing.  Denies any abdominal pain nausea or vomiting.  #Anxiety/depression, stable.  Continue scheduled Klonopin  #Type 2 diabetes.  A1c 5.8 continue Lantus 15 units, sliding scale, monitor CBG  #Hypokalemia, resolved.  Currently on maintenance dose potassium, likely setting of  aggressive IV Lasix diuresis.  Continue to monitor BMP.  #Anemia, acute blood loss anemia in setting of multiple hematoma/evacuations while on anticoagulation.  Currently stable with hemoglobin range of 10-11.  Monitor CBC.  #Sinus bradycardia, resolved.  Thought to be secondary to argatroban drip, cardiology was consulted (10/23)and recommended discontinuation  drip, heart rate improved afterwards.  Additionally Midrin was also felt to be contributing, since discontinued on 12/4.  #Hypertension, improving close to goal.  Most recent BP 132/73 . continue to monitor, continue PRN hydralazine as needed  #Stage II pressure ulcer on the right ischial tuberosity, not present on admission.  Wound care consulted and following recommendations   #Severe OA of left knee.  Pain resolved.  Discontinued colchicine given loose stool.  Continue oxycodone for as needed relief, IV morphine for breakthrough.  Code Status: Full code  Family Communication: No family at bedside  Disposition Plan: Working up fever/slight elevation of leukocytosis over the next 24 hours, monitoring blood cultures, Education officer, museum working on potential facility/spine rehabilitation centers  Consultants:  Hematology/oncology, cardiology, neurosurgery, surgery  Procedures:  On 10/2 1. posterior cervical decompressive laminectomy at C3, C4, C5 with removal of the spinous process and complete laminectomy and foraminotomies of the C3, C4, C5 nerve roots. 2. Posterior cervical fusion with lateral mass screws at C3-C4-C5 and pars screws at C2 lies in the globus ellipseLateral mass screw system 3. Posterior lateral arthrodesis C2-C5 utilizing locally harvested autograft mixed with vivigen  06/22/2018:(Preceded by symptoms of worsening left-sided paresthesia weakness, MRI noted postoperative hematoma) Procedure reexploration posterior cervical wound for evacuation of epidural hematoma  06/26/2018 DVT ultrasound:Right: Findings  consistent with acute deep vein thrombosis involving the right common femoral vein, right femoral vein, right popliteal vein, right posterior tibial vein, and right peroneal vein. Thrombosis extends proximally into external iliac vein.  Proximal iliac vein not  visualized. Left: Findings consistent with acute deep vein thrombosis involving the left common femoral vein, left femoral vein, left popliteal vein, left posterior tibial vein, and left peroneal vein. Findings consistent with acute superficial vein thrombosis  involving the left great saphenous vein. Thrombosis extends proximally into external iliac vein. Proximal iliac vein not visualized.  07/01/18 (symptoms of decreased left hand grip, MRI showed large epidural hematoma) requiring operative clot evacuation   07/02/18 RUE doppler, no DVT in right upper extremity  10/28/2019Bilateral lower extremity Doppler, acute DVT in right, acute DVT in left common femoral vein, left proximal profunda vein, left femoral vein, left popliteal vein, left posterior tibial vein, and left peroneal vein  TTE, 07/27/2018, EF 25-05%, grade 1 diastolic dysfunction  39/76/7341 bilateral lower extremity Doppler, DVT in bilateral saphenofemoral junctions, common femoral, femoral, popliteal, posterior tibial, and peroneal veins  08/10/2018 Left upper extremity duplex, acute DVT in left subclavian veins, left axillary vein, left brachial vein and left radial veins.  Acute superficial vein thrombosis involving the left basilar vein and left cephalic vein.   Antimicrobials: Anti-infectives (From admission, onward)   Start     Dose/Rate Route Frequency Ordered Stop   08/13/18 1000  ceFEPIme (MAXIPIME) 1 g in sodium chloride 0.9 % 100 mL IVPB     1 g 200 mL/hr over 30 Minutes Intravenous Every 8 hours 08/13/18 0949 08/14/18 2259   08/05/18 1415  vancomycin (VANCOCIN) IVPB 1000 mg/200 mL premix  Status:  Discontinued     1,000 mg 200 mL/hr over 60 Minutes  Intravenous Every 8 hours 08/05/18 1410 08/08/18 1257   08/05/18 1415  ceFEPIme (MAXIPIME) 1 g in sodium chloride 0.9 % 100 mL IVPB     1 g 200 mL/hr over 30 Minutes Intravenous Every 8 hours 08/05/18 1410 08/12/18 0544   08/04/18 0645  levofloxacin (LEVAQUIN) IVPB 500 mg  Status:  Discontinued     500 mg 100 mL/hr over 60 Minutes Intravenous Every 24 hours 08/04/18 0642 08/05/18 1316   07/25/18 1700  fluconazole (DIFLUCAN) IVPB 400 mg  Status:  Discontinued     400 mg 100 mL/hr over 120 Minutes Intravenous Every 24 hours 07/25/18 1633 07/27/18 1415   07/12/18 2100  ceFAZolin (ANCEF) IVPB 2g/100 mL premix  Status:  Discontinued     2 g 200 mL/hr over 30 Minutes Intravenous Every 8 hours 07/12/18 1555 07/15/18 1050   07/10/18 2229  bacitracin 50,000 Units in sodium chloride 0.9 % 500 mL irrigation  Status:  Discontinued       As needed 07/10/18 2230 07/10/18 2300   07/07/18 1133  bacitracin 50,000 Units in sodium chloride 0.9 % 500 mL irrigation  Status:  Discontinued       As needed 07/07/18 1133 07/07/18 1357   07/06/18 0900  ceFAZolin (ANCEF) IVPB 2g/100 mL premix  Status:  Discontinued     2 g 200 mL/hr over 30 Minutes Intravenous Every 8 hours 07/06/18 0810 07/12/18 1555   07/01/18 2240  bacitracin 50,000 Units in sodium chloride 0.9 % 500 mL irrigation  Status:  Discontinued       As needed 07/01/18 2240 07/01/18 2324   06/30/18 1330  doxycycline (VIBRA-TABS) tablet 100 mg  Status:  Discontinued     100 mg Oral Every 12 hours 06/30/18 1329 07/12/18 1429   06/27/18 1000  fluconazole (DIFLUCAN) tablet 200 mg  Status:  Discontinued     200 mg Oral Daily 06/27/18 0725 07/04/18 0720   06/22/18 2315  ceFAZolin (ANCEF)  IVPB 2g/100 mL premix     2 g 200 mL/hr over 30 Minutes Intravenous Every 8 hours 06/22/18 2314 06/27/18 1519   06/22/18 1828  bacitracin 50,000 Units in sodium chloride 0.9 % 500 mL irrigation  Status:  Discontinued       As needed 06/22/18 1828 06/22/18 1915   06/22/18  1700  ceFAZolin (ANCEF) 3 g in dextrose 5 % 50 mL IVPB  Status:  Discontinued     3 g 100 mL/hr over 30 Minutes Intravenous  Once 06/22/18 1647 06/22/18 2101   06/14/18 2100  ceFAZolin (ANCEF) IVPB 2g/100 mL premix     2 g 200 mL/hr over 30 Minutes Intravenous Every 8 hours 06/14/18 1712 06/17/18 0800   06/14/18 1414  bacitracin 50,000 Units in sodium chloride 0.9 % 500 mL irrigation  Status:  Discontinued       As needed 06/14/18 1414 06/14/18 1647   06/14/18 1215  ceFAZolin (ANCEF) 3 g in dextrose 5 % 50 mL IVPB     3 g 160 mL/hr over 30 Minutes Intravenous To [facility] 06/14/18 1202 06/14/18 2225   06/14/18 1200  ceFAZolin (ANCEF) IVPB 2g/100 mL premix  Status:  Discontinued     2 g 200 mL/hr over 30 Minutes Intravenous On call to O.R. 06/14/18 1157 06/14/18 2140          DVT prophylaxis: Eliquis   Objective: Vitals:   08/27/18 2300 08/28/18 0000 08/28/18 0300 08/28/18 0700  BP: 130/76 127/81 115/74 120/79  Pulse: (!) 129 (!) 127 (!) 114 (!) 111  Resp: (!) _0 Temp: (!) 102.4 F (39.1 C)  99.9 F (37.7 C) 98.4 F (36.9 C)  TempSrc: Oral  Oral   SpO2: 98% 96% 97% 97%  Weight:      Height:        Intake/Output Summary (Last 24 hours) at 08/28/2018 1240 Last data filed at 08/28/2018 6578 Gross per 24 hour  Intake 360 ml  Output 1750 ml  Net -1390 ml   Filed Weights   08/22/18 0500 08/24/18 0416 08/27/18 0500  Weight: 127.8 kg 128.9 kg 124.8 kg    Exam:  Constitutional: Obese male, no distress Eyes: EOMI, anicteric, normal conjunctivae ENMT: Oropharynx with moist mucous membranes, normal dentition, soft cervical collar in place Cardiovascular: RRR no MRGs, nonpitting edema(improved from prior exam) most pronounced in proximal bilateral lower extremities and left upper extremity Respiratory: Normal respiratory effort on room air, clear breath sounds on anterior chest Abdomen: Soft,non-tender, normal bowel sounds Skin: No rash ulcers, or  lesions. Without skin tenting  Neurologic: 3/5 strength in right upper extremity (able to move against gravity), able to move left hand against gravity but no other strength throughout arm, no movement in left leg, barely able to move toes on right leg Psychiatric:flat affect, and anxious/frustrated mood. Mental status AAOx3  Data Reviewed: CBC: Recent Labs  Lab 08/23/18 1138 08/24/18 0520 08/25/18 0500 08/26/18 1159 08/28/18 0728  WBC 16.1* 13.3* 14.2* 15.0* 16.0*  HGB 11.3* 11.7* 12.0* 12.4* 11.5*  HCT 38.7* 39.2 40.8 41.2 38.8*  MCV 98.0 97.8 98.1 96.9 96.5  PLT 223 225 246 212 469   Basic Metabolic Panel: Recent Labs  Lab 08/24/18 0520 08/25/18 0500 08/26/18 1100 08/27/18 2109 08/28/18 0728  NA 144 142 142 140 139  K 4.1 4.6 4.9 4.4 3.5  CL 103 98 101 99 95*  CO2 32 32 _1 GLUCOSE 93 108* 129* 148* 128*  BUN  41* 39* 31* 25* 22*  CREATININE 0.76 0.63 0.75 0.74 0.72  CALCIUM 9.6 9.9 9.7 9.6 9.5   GFR: Estimated Creatinine Clearance: 141 mL/min (by C-G formula based on SCr of 0.72 mg/dL). Liver Function Tests: No results for input(s): AST, ALT, ALKPHOS, BILITOT, PROT, ALBUMIN in the last 168 hours. No results for input(s): LIPASE, AMYLASE in the last 168 hours. No results for input(s): AMMONIA in the last 168 hours. Coagulation Profile: No results for input(s): INR, PROTIME in the last 168 hours. Cardiac Enzymes: No results for input(s): CKTOTAL, CKMB, CKMBINDEX, TROPONINI in the last 168 hours. BNP (last 3 results) No results for input(s): PROBNP in the last 8760 hours. HbA1C: No results for input(s): HGBA1C in the last 72 hours. CBG: Recent Labs  Lab 08/27/18 0808 08/27/18 1314 08/27/18 1706 08/27/18 1951 08/28/18 0735  GLUCAP 141* 113* 112* 120* 106*   Lipid Profile: No results for input(s): CHOL, HDL, LDLCALC, TRIG, CHOLHDL, LDLDIRECT in the last 72 hours. Thyroid Function Tests: No results for input(s): TSH, T4TOTAL, FREET4, T3FREE, THYROIDAB  in the last 72 hours. Anemia Panel: No results for input(s): VITAMINB12, FOLATE, FERRITIN, TIBC, IRON, RETICCTPCT in the last 72 hours. Urine analysis:    Component Value Date/Time   COLORURINE AMBER (A) 06/07/2018 0900   APPEARANCEUR CLOUDY (A) 06/07/2018 0900   LABSPEC 1.028 06/07/2018 0900   PHURINE 5.0 06/07/2018 0900   GLUCOSEU NEGATIVE 06/07/2018 0900   HGBUR SMALL (A) 06/07/2018 0900   BILIRUBINUR NEGATIVE 06/07/2018 0900   KETONESUR 5 (A) 06/07/2018 0900   PROTEINUR 30 (A) 06/07/2018 0900   NITRITE NEGATIVE 06/07/2018 0900   LEUKOCYTESUR NEGATIVE 06/07/2018 0900   Sepsis Labs: _0 (procalcitonin:4,lacticidven:4)  )No results found for this or any previous visit (from the past 240 hour(s)).    Studies: Dg Chest Port 1 View  Result Date: 08/28/2018 CLINICAL DATA:  Unexplained fever EXAM: PORTABLE CHEST 1 VIEW COMPARISON:  08/16/2018 FINDINGS: Removal of the right IJ central venous catheter since 08/16/2018. Low lung volumes noted with basilar atelectasis, worse on the left. Stable heart size and vascularity. No effusion or pneumothorax. Trachea is midline. Degenerative changes of the spine. IMPRESSION: Low lung volumes with basilar atelectasis. Electronically Signed   By: Jerilynn Mages.  Shick M.D.   On: 08/28/2018 10:25    Scheduled Meds: . sodium chloride   Intravenous Once  . sodium chloride   Intravenous Once  . apixaban  5 mg Oral BID  . bismuth subsalicylate  30 mL Oral TID AC & HS  . clonazePAM  0.5 mg Oral BID  . famotidine  20 mg Oral Daily  . feeding supplement (ENSURE ENLIVE)  237 mL Oral TID BM  . furosemide  40 mg Oral BID  . gabapentin  300 mg Oral TID  . insulin aspart  0-15 Units Subcutaneous TID WC  . insulin aspart  0-5 Units Subcutaneous QHS  . insulin glargine  15 Units Subcutaneous QHS  . magic mouthwash w/lidocaine  5 mL Oral TID  . [START ON 08/29/2018] mouth rinse  15 mL Mouth Rinse BID  . multivitamin with minerals  1 tablet Oral Daily  .  potassium chloride  40 mEq Oral Daily  . sodium chloride flush  10-40 mL Intracatheter Q12H    Continuous Infusions: . sodium chloride 10 mL/hr at 08/15/18 0200     LOS: 82 days     Desiree Hane, MD [facility] Pager [phone]  If 7PM-7AM, please contact night-coverage [email or web address] Password [email or web address] 08/28/2018, 12:40 PM

## 2018-08-28 NOTE — Progress Notes (Signed)
Physical Therapy Treatment Patient Details Name: [name] [name] MRN: [id number] DOB: Jan 30, 1966 Today's Date: 08/28/2018    History of Present Illness 52 y.o. male admitted on 06/06/18 for bil LE weakness and muscle spasm after multiple falls (with resultant R knee meniscus tear placed in bledsoe brace) following a recent ACDF at [facility] in July 2019 (complicated by PE (s/p IVC filter), CIR, and then SNF placement before finally returning home with his parents).  In the ED MRI of c-spine which showed post operative changes and cord edema and possibility of ischemia.  His R knee was aspirated on 06/09/18.  S/p posterior cervical decompression and fusion on 06/14/18. Entire acute course complicated by hypotension in standing (thought to be autonomic) and by finding of multiple LE DVTs (already has an IVC filter in place, but per MDs notes at risk for blocking the filter with these clots).  Pt with post op issues with incisional bleeding s/p 2 evacuations of hematomas and placement of hemovac (06/22/18 and 07/01/18) as well as multiple units of plasma.  Cardiology consulted 07/05/18 due to bradycardia thought to be from his newly started blood thinner.  Pt with another re-accumulation of epidural hematoma with increased L sided weakness and cord compression with emergent evacuation surgery 10/25 & 07/10/18.  Pt also needing more platelets post op.  Bone marrow bx 07/12/18.  Pt found to have submassive PE on 07/27/18 and re-started on anticoagulation. PMHx of HTN, gout, anemia, DDD (lumbar).  08/10/18 found to have a L UE thrombus (same arm as his PICC line).      PT Comments    Goals re-assessed today.  Pt had a limited EOB session today due to increased pain in buttocks and left ankle compared to previous session.  Pt also showing some signs of delerium (had some signs of this before).  He did not know what day of the week it was and even once he knew the day and date he could not problem solve how many days  until his birthday ([age]).  Lights left on at end of session to hopefully help.  Per his friend, who was in the room, he has been sleeping all day.   PT will continue to follow acutely for safe mobility progression  Follow Up Recommendations  SNF     Equipment Recommendations  Wheelchair (measurements PT);Wheelchair cushion (measurements PT);Other (comment);Hospital bed(20x20 tilt in space, power drive, roho, hoyer lift)    Recommendations for Other Services   NA     Precautions / Restrictions Precautions Precautions: Fall;Cervical;Other (comment) Precaution Comments: Several bed sores on bottom.   Required Braces or Orthoses: Cervical Brace Cervical Brace: Soft collar(PRN for comfort) Other Brace: condom cath and bil PRAFOs    Mobility  Bed Mobility Overal bed mobility: Needs Assistance Bed Mobility: Rolling;Sidelying to Sit;Sit to Sidelying Rolling: +2 for physical assistance;Total assist Sidelying to sit: +2 for physical assistance;Total assist     Sit to sidelying: +2 for physical assistance;Total assist General bed mobility comments: Two to three person total assist for rolling, transition to sitting from side lying and returning back to bed from sitting->sidelying->supine.  Three people used as we had that many available to make his transitions less scary.  He is able to get EOB typically with two.         Balance Overall balance assessment: Needs assistance Sitting-balance support: Feet supported;No upper extremity supported;Bilateral upper extremity supported;Single extremity supported Sitting balance-Leahy Scale: Zero Sitting balance - Comments: total assist EOB today, pt immediately reporting  pain in left ankle and left buttocks once seated EOB.  More support needed at trunk and pt unable to relax due to pain.  Therapists attempted repositioning and changing the amount of air in the airmattress, but he was unable to continue.  We sat up for ~5-8 mins before  repositioning in supine rotated to the right.  He was able to preform cervical ROM in sitting with gentle PT overpressure and massage to his opposing upper trap.  We were unable to do our usual round of scap retractions, seated weight shifts onto bil elbows, etc. today due to pain in sitting.                                     Cognition Arousal/Alertness: Lethargic(a shade lethargic) Behavior During Therapy: WFL for tasks assessed/performed Overall Cognitive Status: Impaired/Different from baseline Area of Impairment: Orientation;Memory                 Orientation Level: Disoriented to;Time   Memory: Decreased short-term memory         General Comments: Pt a bit confused on day of the week and date.  Even when given the date, he could not problem solve how many days away his birthday was from today (seven days).  Lights left on at the end of the session as RN and firend in room report he has been sleeping all day.  Attempting to re orient to decrease delerium.       Exercises General Exercises - Lower Extremity Ankle Circles/Pumps: PROM;Both;10 reps Heel Slides: PROM;Both;10 reps Hip ABduction/ADduction: PROM;Both;10 reps Other Exercises Other Exercises: hip IR bil x 10 reps    General Comments General comments (skin integrity, edema, etc.): Pt was positioned on his left side when PT/OT entered the room, repositioned on his right side when we exited with PRAFO boots re-donned and an ice pack to L ankle for pain relief. RN in room at end of session administering pain meds.  Pt's BP stable when checked prior to mobility.       Pertinent Vitals/Pain Pain Assessment: Faces Faces Pain Scale: Hurts whole lot Pain Location: Left buttocks and L ankle, L shoulder Pain Descriptors / Indicators: Grimacing;Guarding Pain Intervention(s): Limited activity within patient's tolerance;Monitored during session;Repositioned;Patient requesting pain meds-RN notified            PT Goals (current goals can now be found in the care plan section) Acute Rehab PT Goals Patient Stated Goal: to have improvements in his strength so he can start standing again. PT Goal Formulation: With patient Time For Goal Achievement: 09/11/18 Potential to Achieve Goals: Fair Progress towards PT goals: Not progressing toward goals - comment(limited by pain today)    Frequency    Min 2X/week      PT Plan Current plan remains appropriate    Co-evaluation PT/OT/SLP Co-Evaluation/Treatment: Yes Reason for Co-Treatment: Complexity of the patient's impairments (multi-system involvement);Necessary to address cognition/behavior during functional activity;For patient/therapist safety;To address functional/ADL transfers PT goals addressed during session: Mobility/safety with mobility;Balance;Strengthening/ROM        AM-PAC PT "6 Clicks" Mobility   Outcome Measure  Help needed turning from your back to your side while in a flat bed without using bedrails?: Total Help needed moving from lying on your back to sitting on the side of a flat bed without using bedrails?: Total Help needed moving to and from a bed to a chair (including  a wheelchair)?: Total Help needed standing up from a chair using your arms (e.g., wheelchair or bedside chair)?: Total Help needed to walk in hospital room?: Total Help needed climbing 3-5 steps with a railing? : Total 6 Click Score: 6    End of Session Equipment Utilized During Treatment: Other (comment)(bil PRAFOS) Activity Tolerance: Patient limited by pain Patient left: in bed;with call bell/phone within reach;with family/visitor present;with nursing/sitter in room Nurse Communication: Mobility status PT Visit Diagnosis: Other abnormalities of gait and mobility (R26.89);Other symptoms and signs involving the nervous system (R29.898);Muscle weakness (generalized) (M62.81)     Time: [id number] PT Time Calculation (min) (ACUTE ONLY): 58  min  Charges:  $Therapeutic Exercise: 8-22 mins $Therapeutic Activity: 8-22 mins                    Ezechiel Stooksbury B. Naftali Carchi, PT, DPT  Acute Rehabilitation [phone] pager #[phone] office   08/28/2018, 3:33 PM

## 2018-08-28 NOTE — Social Work (Addendum)
3:49pm- Spoke with [name] [name] again this afternoon, therapy note from today sent and she is reviewing the complete referral packet currently.    1:21pm- Spoke with MD, Dr. Caleb PoppNettey and was given [facility]. Spoke with Lendon CollarAlice [name], MSW at [facility].  She requested that clinicals be sent to her at [phone].   [name] [name], MSW, [facility] Clinical Social Work [phone]

## 2018-08-29 ENCOUNTER — Inpatient Hospital Stay (HOSPITAL_COMMUNITY)

## 2018-08-29 DIAGNOSIS — I82409 Acute embolism and thrombosis of unspecified deep veins of unspecified lower extremity: Secondary | ICD-10-CM

## 2018-08-29 DIAGNOSIS — R52 Pain, unspecified: Secondary | ICD-10-CM

## 2018-08-29 LAB — CBC
HCT: 37.9 % — ABNORMAL LOW (ref 39.0–52.0)
HCT: 36.3 % — ABNORMAL LOW (ref 39.0–52.0)
HEMOGLOBIN: 11.6 g/dL — AB (ref 13.0–17.0)
Hemoglobin: 11.4 g/dL — ABNORMAL LOW (ref 13.0–17.0)
MCH: 29.4 pg (ref 26.0–34.0)
MCH: 29.6 pg (ref 26.0–34.0)
MCHC: 30.6 g/dL (ref 30.0–36.0)
MCHC: 31.4 g/dL (ref 30.0–36.0)
MCV: 95.9 fL (ref 80.0–100.0)
MCV: 94.3 fL (ref 80.0–100.0)
NRBC: 0.1 % (ref 0.0–0.2)
Platelets: 150 10*3/uL (ref 150–400)
Platelets: 170 10*3/uL (ref 150–400)
RBC: 3.95 MIL/uL — AB (ref 4.22–5.81)
RBC: 3.85 MIL/uL — AB (ref 4.22–5.81)
RDW: 17.4 % — ABNORMAL HIGH (ref 11.5–15.5)
RDW: 17.3 % — ABNORMAL HIGH (ref 11.5–15.5)
WBC: 16.3 10*3/uL — ABNORMAL HIGH (ref 4.0–10.5)
WBC: 16 10*3/uL — AB (ref 4.0–10.5)
nRBC: 0.1 % (ref 0.0–0.2)

## 2018-08-29 LAB — BASIC METABOLIC PANEL
ANION GAP: 15 (ref 5–15)
Anion gap: 16 — ABNORMAL HIGH (ref 5–15)
BUN: 22 mg/dL — ABNORMAL HIGH (ref 6–20)
BUN: 27 mg/dL — ABNORMAL HIGH (ref 6–20)
CHLORIDE: 98 mmol/L (ref 98–111)
CO2: 27 mmol/L (ref 22–32)
CO2: 30 mmol/L (ref 22–32)
Calcium: 9.7 mg/dL (ref 8.9–10.3)
Calcium: 9.9 mg/dL (ref 8.9–10.3)
Chloride: 96 mmol/L — ABNORMAL LOW (ref 98–111)
Creatinine, Ser: 0.8 mg/dL (ref 0.61–1.24)
Creatinine, Ser: 0.72 mg/dL (ref 0.61–1.24)
GFR calc Af Amer: >60 mL/min (ref >60)
GFR calc Af Amer: >60 mL/min (ref >60)
GFR calc non Af Amer: >60 mL/min (ref >60)
GFR calc non Af Amer: >60 mL/min (ref >60)
Glucose, Bld: 124 mg/dL — ABNORMAL HIGH (ref 70–99)
Glucose, Bld: 119 mg/dL — ABNORMAL HIGH (ref 70–99)
Potassium: 3.6 mmol/L (ref 3.5–5.1)
Potassium: 4 mmol/L (ref 3.5–5.1)
Sodium: 141 mmol/L (ref 135–145)
Sodium: 141 mmol/L (ref 135–145)

## 2018-08-29 LAB — GLUCOSE, CAPILLARY
Glucose-Capillary: 111 mg/dL — ABNORMAL HIGH (ref 70–99)
Glucose-Capillary: 107 mg/dL — ABNORMAL HIGH (ref 70–99)
Glucose-Capillary: 102 mg/dL — ABNORMAL HIGH (ref 70–99)
Glucose-Capillary: 120 mg/dL — ABNORMAL HIGH (ref 70–99)

## 2018-08-29 MED ORDER — HYDROMORPHONE HCL 1 MG/ML IJ SOLN
0.5000 mg | INTRAMUSCULAR | Status: DC | PRN
  Administered 2018-08-30 (×2): 0.5 mg via INTRAVENOUS
  Filled 2018-08-29 (×2): qty 1

## 2018-08-29 MED ORDER — IOHEXOL 300 MG/ML  SOLN
100.0000 mL | Freq: Once | INTRAMUSCULAR | Status: AC | PRN
  Administered 2018-08-29: 100 mL via INTRAVENOUS

## 2018-08-29 NOTE — Progress Notes (Signed)
LE venous duplex       has been completed. Preliminary results can be found under CV proc through chart review. [name] LeveringJill Keyshun [name], BS, RDMS, RVT    Appears improved since last study

## 2018-08-29 NOTE — Progress Notes (Signed)
PROGRESS NOTE  [name] [name] WUJ:[id number] DOB: November 07, 1965 DOA: 06/06/2018 PCP: Helane Rima, MD  HPI/Brief Narrative  [name] [name] is a 52 y.o. year old male with medical history significant for gout, hypertension, underwent anterior cervical corpectomy of C4 on 9/56/2130 with complicated postoperative course including development of multiple PEs and DVTs started on anticoagulation and subsequently developed  spinal hematoma requiring reexploration on 03/28/2018 status post placement of IVC filter then discharged to SNF who presented on 06/06/2018 as a transfer from [facility] with cervical spine MRI showing changes consistent with cord compression, cord edema and concern for ischemia.  His hospital course has been complicated by cervical decompression laminectomy, postoperative hematomas and multiple evacuations, new bilateral lower extremity DVTs, epidural hematoma (86/57, 84/69) complicating anticoagulation therapy (briefly on argatroban and discontinued given recurrent hematomas), new PE now stable on eliquis, bone marrow biopsy for evaluation of thrombocytopenia, platelet and endplate transfusions.  Cervical spine 08/19/18 MRI which neurosurgery evaluated and deemed it looked stable and recommended no further work-up. Patient has been stable throughout this week until spike a fever to 102 overnight on 12/15 followed by another fever overnight on 12/16.  In both settings patient was noted to be somewhat altered with symptoms of disorientation but no new neurologic deficits.  We will work-up is included negative chest x-ray, no acute abnormalities on CT abdomen, no repeat or worsening DVTs on lower extremity venous duplex and no growth on blood cultures x2 obtain on 12/16.  Unfortunately [name] seems to lack insight into his current condition.  He often focuses on the goal of having PT in the hospital until he is able to walk, despite my conversations with him about the loss of  function in his lower legs (stable quadriparesis).  Neurosurgery has continue to recommend aggressive spinal rehabilitation.  Social worker is currently working on the disposition with the [place].  Subjective Overnight reports of increased right hand tremor. Evaluation found to be tachycardic to 130s with normal blood pressure, and temp of 100.5 with normal glucose.  Again had fever of 102.4 11 PM On my assessment this morning patient at baseline mental status, denies any cough, chest pain, abdominal pain. Rectal tube was removed on 12/15.  Had 2 loose stool since then No neurologic deficits reported  Assessment/Plan:  #Sirs.  Again spiked fever reported overnight T-max of 103 with stable leukocytosis of 16 (chronicallystable for the past 7 days).  Infectious work-up has been unremarkable blood culture still negative and no acute abnormalities on chest x-ray or CT of abdomen.  Furthermore patient has no localizing signs or symptoms to currently suggest infection.  Does display some disorientation intermittently with a return to mental status baseline alert and oriented x4.  Suspect there may be some mild infectious causes but venous duplex of lower extremities have been negative.  It may be warranted to evaluate with upper extremity venous duplex as he has had multiple recurrent DVTs during hospital stay often preceded by fever.  If spikes an additional fever it may be best to cover empirically with broad-spectrum antibiotics and obtain repeat culture data.  Other potential infectious sources to consider will be his pressure ulcers (ischium).  Continue to monitor with low threshold to initiate antibiotics.  #Severe cervical spine stenosis, with quadriparesis stable.  Stable quadriparesis, right upper extremity stronger than left upper extremity (consistent with prior exams based on chart review by neurosurgery evaluation) status post decompressive surgery complicated by multiple postoperative hematomas and  evacuations.  Most recent MRI  of cervical spine on 12/7 reports chronic sequelae of compressive myelopathy.  Case discussed with neurosurgery who deemed slight improvement from prior films since surgery, no further plans for surgery given neurologically stable and very high risk for further bleeding in the cervical area per NSG.   will need aggressive rehabilitation with SNF(previously considered [facility]), social worker assisting, currently looking into spine rehab center with the [place].  #Acute PE/DVT in patient with history of PE and DVT, stable.  Has IVC filter (03/2018).  Has had multiple events of postoperative epidural hematomas which led to holding his home anticoagulation, found to have acute DVT in lower extremities on 10/14 while holding anticoagulation, started on bivalirudin 11/13 for new submassive PE, found to have DVTs in left upper extremities on that treatment so switched to argatroban 11/29 (per hematology), back on his home regimen of Eliquis since 12/4. Stable hemoglobin on CBC, no current oxygen requirements on room air.  No signs or symptoms of bleeding.    #Edema in bilateral lower extremities and upper left extremity, stable.  Suspect this is most likely combination of dependent edema and third spacing in the setting of hypoalbuminemia. With 17.3 L out since admission.  No history of CHF, anticipate improvement as patient is getting massage/muscle activation with physical therapy, frequent turnings.  Doing well on oral Lasix therapy.  Continue scheduled potassium, monitor BMP I's and O's, daily weight  #Incidental finding of pneumoperitoneum on 11/23, resolved.  Free air on abdominal imaging on 11/21,  abd xr and repeat CT abdomen cited pneumoperitoneum.  Upper GI series on 11/25 was negative for any perforation.  Patient has remained clinically stable without nausea, vomiting or worsening abdominal pain.  Continue PPI for stress ulcer prophylaxis.  Completed a course of  antibiotics (last dose of cefepime 12/3) during hospital stay.  #Thrombocytopenia, resolved.  Extensive work-up including bone marrow biopsy, initiation of multiple platelet transfusions with assistance from Dr. Marin Olp with no  clear etiology.  Briefly required Argatroban drip due to concern for possible HIT (HIT panel was mildly elevated at 0.486, serotonin release assay was negative), he was then transitioned to bivalirudin because of bradycardia (10/23) that resolved with discontinuation of our argatroban  #Orthostatic hypotension with near syncope, improving likely multifactorial related to hypovolemia from earlier hospital stay and autonomic dysfunction.  TTE shows grade 2 diastolic dysfunction currently not volume overloaded.  Previously on Midrin which was decreased due to bradycardia and discontinued on 12/4.  Blood pressure normal, unclear orthostatic as patient unable to stand, no changes in blood pressure when he sits.  #Loose stool.  Patient denies any current gout-like symptoms.  Discontinued colchicine.  No other medications that could be contributing.  Denies any abdominal pain nausea or vomiting.  Rectal tube was removed earlier this week  #Anxiety/depression, stable.  Continue scheduled Klonopin  #Type 2 diabetes.  A1c 5.8 continue Lantus 15 units, sliding scale, monitor CBG  #Hypokalemia, resolved.  Currently on maintenance dose potassium, while on oral Lasix.  Continue to monitor BMP.  #Anemia, acute blood loss anemia in setting of multiple hematoma/evacuations while on anticoagulation.  Currently stable with hemoglobin range of 10-11.  Monitor CBC.  #Sinus bradycardia, resolved.  Thought to be secondary to argatroban drip, cardiology was consulted (10/23)and recommended discontinuation  drip, heart rate improved afterwards.  Additionally Midrin was also felt to be contributing, since discontinued on 12/4.  #Hypertension, improving close to goal.  Most recent BP 132/73 .  continue to monitor, continue PRN hydralazine  as needed  #Stage II pressure ulcer on the right ischial tuberosity, not present on admission.  Wound care consulted and following recommendations   #Severe OA of left knee.  Pain resolved.  Discontinued colchicine given loose stool.  Continue oxycodone for as needed relief, IV morphine for breakthrough.  Code Status: Full code  Family Communication: No family at bedside  Disposition Plan: Working up fever/slight elevation of stable leukocytosis over the next 24 hours, monitoring blood cultures, Education officer, museum working on potential facility/spine rehabilitation centers  Consultants:  Hematology/oncology, cardiology, neurosurgery, surgery  Procedures:  On 10/2 1. posterior cervical decompressive laminectomy at C3, C4, C5 with removal of the spinous process and complete laminectomy and foraminotomies of the C3, C4, C5 nerve roots. 2. Posterior cervical fusion with lateral mass screws at C3-C4-C5 and pars screws at C2 lies in the globus ellipseLateral mass screw system 3. Posterior lateral arthrodesis C2-C5 utilizing locally harvested autograft mixed with vivigen  06/22/2018:(Preceded by symptoms of worsening left-sided paresthesia weakness, MRI noted postoperative hematoma) Procedure reexploration posterior cervical wound for evacuation of epidural hematoma  06/26/2018 DVT ultrasound:Right: Findings consistent with acute deep vein thrombosis involving the right common femoral vein, right femoral vein, right popliteal vein, right posterior tibial vein, and right peroneal vein. Thrombosis extends proximally into external iliac vein.  Proximal iliac vein not visualized. Left: Findings consistent with acute deep vein thrombosis involving the left common femoral vein, left femoral vein, left popliteal vein, left posterior tibial vein, and left peroneal vein. Findings consistent with acute superficial vein thrombosis  involving the left great  saphenous vein. Thrombosis extends proximally into external iliac vein. Proximal iliac vein not visualized.  07/01/18 (symptoms of decreased left hand grip, MRI showed large epidural hematoma) requiring operative clot evacuation   07/02/18 RUE doppler, no DVT in right upper extremity  10/28/2019Bilateral lower extremity Doppler, acute DVT in right, acute DVT in left common femoral vein, left proximal profunda vein, left femoral vein, left popliteal vein, left posterior tibial vein, and left peroneal vein  TTE, 07/27/2018, EF 02-72%, grade 1 diastolic dysfunction  53/66/4403 bilateral lower extremity Doppler, DVT in bilateral saphenofemoral junctions, common femoral, femoral, popliteal, posterior tibial, and peroneal veins  08/10/2018 Left upper extremity duplex, acute DVT in left subclavian veins, left axillary vein, left brachial vein and left radial veins.  Acute superficial vein thrombosis involving the left basilar vein and left cephalic vein.  08/29/2018 bilateral lower extremity duplex Age indeterminate DVT in the right common femoral vein, right femoral vein, right proximal profunda vein, right popliteal vein, right posterior tibial vein, and right peroneal vein.  And age-indeterminate left DVT involving the left common femoral vein, left femoral vein, left proximal profunda vein, left popliteal vein, left posterior tibial vein, and left peroneal vein   Antimicrobials: Anti-infectives (From admission, onward)   Start     Dose/Rate Route Frequency Ordered Stop   08/13/18 1000  ceFEPIme (MAXIPIME) 1 g in sodium chloride 0.9 % 100 mL IVPB     1 g 200 mL/hr over 30 Minutes Intravenous Every 8 hours 08/13/18 0949 08/14/18 2259   08/05/18 1415  vancomycin (VANCOCIN) IVPB 1000 mg/200 mL premix  Status:  Discontinued     1,000 mg 200 mL/hr over 60 Minutes Intravenous Every 8 hours 08/05/18 1410 08/08/18 1257   08/05/18 1415  ceFEPIme (MAXIPIME) 1 g in sodium chloride 0.9 % 100 mL IVPB      1 g 200 mL/hr over 30 Minutes Intravenous Every 8 hours 08/05/18 1410 08/12/18 0544  08/04/18 0645  levofloxacin (LEVAQUIN) IVPB 500 mg  Status:  Discontinued     500 mg 100 mL/hr over 60 Minutes Intravenous Every 24 hours 08/04/18 0642 08/05/18 1316   07/25/18 1700  fluconazole (DIFLUCAN) IVPB 400 mg  Status:  Discontinued     400 mg 100 mL/hr over 120 Minutes Intravenous Every 24 hours 07/25/18 1633 07/27/18 1415   07/12/18 2100  ceFAZolin (ANCEF) IVPB 2g/100 mL premix  Status:  Discontinued     2 g 200 mL/hr over 30 Minutes Intravenous Every 8 hours 07/12/18 1555 07/15/18 1050   07/10/18 2229  bacitracin 50,000 Units in sodium chloride 0.9 % 500 mL irrigation  Status:  Discontinued       As needed 07/10/18 2230 07/10/18 2300   07/07/18 1133  bacitracin 50,000 Units in sodium chloride 0.9 % 500 mL irrigation  Status:  Discontinued       As needed 07/07/18 1133 07/07/18 1357   07/06/18 0900  ceFAZolin (ANCEF) IVPB 2g/100 mL premix  Status:  Discontinued     2 g 200 mL/hr over 30 Minutes Intravenous Every 8 hours 07/06/18 0810 07/12/18 1555   07/01/18 2240  bacitracin 50,000 Units in sodium chloride 0.9 % 500 mL irrigation  Status:  Discontinued       As needed 07/01/18 2240 07/01/18 2324   06/30/18 1330  doxycycline (VIBRA-TABS) tablet 100 mg  Status:  Discontinued     100 mg Oral Every 12 hours 06/30/18 1329 07/12/18 1429   06/27/18 1000  fluconazole (DIFLUCAN) tablet 200 mg  Status:  Discontinued     200 mg Oral Daily 06/27/18 0725 07/04/18 0720   06/22/18 2315  ceFAZolin (ANCEF) IVPB 2g/100 mL premix     2 g 200 mL/hr over 30 Minutes Intravenous Every 8 hours 06/22/18 2314 06/27/18 1519   06/22/18 1828  bacitracin 50,000 Units in sodium chloride 0.9 % 500 mL irrigation  Status:  Discontinued       As needed 06/22/18 1828 06/22/18 1915   06/22/18 1700  ceFAZolin (ANCEF) 3 g in dextrose 5 % 50 mL IVPB  Status:  Discontinued     3 g 100 mL/hr over 30 Minutes Intravenous  Once  06/22/18 1647 06/22/18 2101   06/14/18 2100  ceFAZolin (ANCEF) IVPB 2g/100 mL premix     2 g 200 mL/hr over 30 Minutes Intravenous Every 8 hours 06/14/18 1712 06/17/18 0800   06/14/18 1414  bacitracin 50,000 Units in sodium chloride 0.9 % 500 mL irrigation  Status:  Discontinued       As needed 06/14/18 1414 06/14/18 1647   06/14/18 1215  ceFAZolin (ANCEF) 3 g in dextrose 5 % 50 mL IVPB     3 g 160 mL/hr over 30 Minutes Intravenous To [facility] 06/14/18 1202 06/14/18 2225   06/14/18 1200  ceFAZolin (ANCEF) IVPB 2g/100 mL premix  Status:  Discontinued     2 g 200 mL/hr over 30 Minutes Intravenous On call to O.R. 06/14/18 1157 06/14/18 2140          DVT prophylaxis: Eliquis   Objective: Vitals:   08/29/18 0309 08/29/18 0700 08/29/18 1210 08/29/18 1700  BP:  140/87 (!) 148/69 (!) 139/92  Pulse:      Resp: _0 Temp: 98.7 F (37.1 C) 98.6 F (37 C) 99.1 F (37.3 C) 99.1 F (37.3 C)  TempSrc: Oral Oral Oral Oral  SpO2:  96% 98% 98%  Weight: 124.4 kg     Height:  Intake/Output Summary (Last 24 hours) at 08/29/2018 2223 Last data filed at 08/29/2018 1700 Gross per 24 hour  Intake 480 ml  Output 1850 ml  Net -1370 ml   Filed Weights   08/24/18 0416 08/27/18 0500 08/29/18 0309  Weight: 128.9 kg 124.8 kg 124.4 kg    Exam:  Constitutional: Obese male, no distress Eyes: EOMI, anicteric, normal conjunctivae ENMT: Oropharynx with moist mucous membranes, normal dentition, soft cervical collar in place Cardiovascular: RRR no MRGs, nonpitting edema(improved from prior exam) most pronounced in proximal bilateral lower extremities and left upper extremity Respiratory: Normal respiratory effort on room air, clear breath sounds on anterior chest Abdomen: Soft,non-tender, normal bowel sounds Skin: No rash ulcers, or lesions. Without skin tenting  Neurologic: 3/5 strength in right upper extremity (able to move against gravity), able to move left hand  against gravity but no other strength throughout arm, no movement in left leg, barely able to move toes on right leg Psychiatric:flat affect, and anxious/frustrated mood. Mental status AAOx3  Data Reviewed: CBC: Recent Labs  Lab 08/25/18 0500 08/26/18 1159 08/28/18 0728 08/29/18 0718 08/29/18 1654  WBC 14.2* 15.0* 16.0* 16.3* 16.0*  HGB 12.0* 12.4* 11.5* 11.6* 11.4*  HCT 40.8 41.2 38.8* 37.9* 36.3*  MCV 98.1 96.9 96.5 95.9 94.3  PLT 246 212 180 150 997   Basic Metabolic Panel: Recent Labs  Lab 08/26/18 1100 08/27/18 2109 08/28/18 0728 08/29/18 0718 08/29/18 1533  NA 142 140 139 141 141  K 4.9 4.4 3.5 3.6 4.0  CL 101 99 95* 98 96*  CO2 _0 GLUCOSE 129* 148* 128* 124* 119*  BUN 31* 25* 22* 22* 27*  CREATININE 0.75 0.74 0.72 0.80 0.72  CALCIUM 9.7 9.6 9.5 9.7 9.9   GFR: Estimated Creatinine Clearance: 140.9 mL/min (by C-G formula based on SCr of 0.72 mg/dL). Liver Function Tests: No results for input(s): AST, ALT, ALKPHOS, BILITOT, PROT, ALBUMIN in the last 168 hours. No results for input(s): LIPASE, AMYLASE in the last 168 hours. No results for input(s): AMMONIA in the last 168 hours. Coagulation Profile: No results for input(s): INR, PROTIME in the last 168 hours. Cardiac Enzymes: No results for input(s): CKTOTAL, CKMB, CKMBINDEX, TROPONINI in the last 168 hours. BNP (last 3 results) No results for input(s): PROBNP in the last 8760 hours. HbA1C: No results for input(s): HGBA1C in the last 72 hours. CBG: Recent Labs  Lab 08/28/18 2127 08/29/18 0746 08/29/18 1148 08/29/18 1618 08/29/18 2111  GLUCAP 106* 111* 107* 102* 120*   Lipid Profile: No results for input(s): CHOL, HDL, LDLCALC, TRIG, CHOLHDL, LDLDIRECT in the last 72 hours. Thyroid Function Tests: No results for input(s): TSH, T4TOTAL, FREET4, T3FREE, THYROIDAB in the last 72 hours. Anemia Panel: No results for input(s): VITAMINB12, FOLATE, FERRITIN, TIBC, IRON, RETICCTPCT in the last 72  hours. Urine analysis:    Component Value Date/Time   COLORURINE AMBER (A) 06/07/2018 0900   APPEARANCEUR CLOUDY (A) 06/07/2018 0900   LABSPEC 1.028 06/07/2018 0900   PHURINE 5.0 06/07/2018 0900   GLUCOSEU NEGATIVE 06/07/2018 0900   HGBUR SMALL (A) 06/07/2018 0900   BILIRUBINUR NEGATIVE 06/07/2018 0900   KETONESUR 5 (A) 06/07/2018 0900   PROTEINUR 30 (A) 06/07/2018 0900   NITRITE NEGATIVE 06/07/2018 0900   LEUKOCYTESUR NEGATIVE 06/07/2018 0900   Sepsis Labs: _1 (procalcitonin:4,lacticidven:4)  ) Recent Results (from the past 240 hour(s))  Culture, blood (Routine X 2) w Reflex to ID Panel     Status: None (Preliminary result)  Collection Time: 08/28/18  7:28 AM  Result Value Ref Range Status   Specimen Description BLOOD LEFT HAND  Final   Special Requests   Final    BOTTLES DRAWN AEROBIC ONLY Blood Culture adequate volume   Culture   Final    NO GROWTH 1 DAY Performed at [facility], [place] [address], [place], [place] [address]    Report Status PENDING  Incomplete  Culture, blood (Routine X 2) w Reflex to ID Panel     Status: None (Preliminary result)   Collection Time: 08/28/18  7:35 AM  Result Value Ref Range Status   Specimen Description BLOOD RIGHT HAND  Final   Special Requests   Final    BOTTLES DRAWN AEROBIC AND ANAEROBIC Blood Culture adequate volume   Culture   Final    NO GROWTH 1 DAY Performed at [facility], [place] [address], [place], [place] [address]    Report Status PENDING  Incomplete      Studies: Ct Abdomen Pelvis W Contrast  Result Date: 08/29/2018 CLINICAL DATA:  52 y/o  M; intermittent fever. Stable quadriparesis. EXAM: CT ABDOMEN AND PELVIS WITH CONTRAST TECHNIQUE: Multidetector CT imaging of the abdomen and pelvis was performed using the standard protocol following bolus administration of intravenous contrast. CONTRAST:  171m OMNIPAQUE IOHEXOL 300 MG/ML  SOLN COMPARISON:  08/06/2018 CT abdomen and pelvis. FINDINGS: Lower  chest: Platelike atelectasis or scarring at the right lung base. Elevated left hemidiaphragm. Hepatobiliary: No focal liver abnormality is seen. No gallstones, gallbladder wall thickening, or biliary dilatation. Pancreas: Unremarkable. No pancreatic ductal dilatation or surrounding inflammatory changes. Spleen: Normal in size without focal abnormality. Adrenals/Urinary Tract: Adrenal glands are unremarkable. Kidneys are normal, without renal calculi, focal lesion, or hydronephrosis. Bladder is unremarkable. Stomach/Bowel: Stomach and splenic flexure of colon are above the field of view. Visible stomach is within normal limits. Appendix appears normal. No evidence of bowel wall thickening, distention, or inflammatory changes. Vascular/Lymphatic: IVC filter in situ. No significant vascular findings. Reproductive: Prostate is unremarkable. Other: No abdominal wall hernia or abnormality. No abdominopelvic ascites. Musculoskeletal: No fracture is seen. Mild-to-moderate spondylosis of the visible spine greatest at L4-5 where there is loss of intervertebral disc space height and endplate Schmorl's nodes. IMPRESSION: 1. Elevated left hemidiaphragm. Stomach and splenic flexure of colon or above the field of view. 2. No acute process identified. Unremarkable CT of abdomen and pelvis. Electronically Signed   By: LKristine GarbeM.D.   On: 08/29/2018 21:15   Vas UKoreaLower Extremity Venous (dvt)  Result Date: 08/29/2018  Lower Venous Study Indications: Extensive DVT since 06/26/18, unable to anticoagulate at first due to bleed risk. [facility]  Performing Technologist: JLandry MellowRDMS, RVT  Examination Guidelines: A complete evaluation includes B-mode imaging, spectral Doppler, color Doppler, and power Doppler as needed of all accessible portions of each vessel. Bilateral testing is considered an integral part of a complete examination. Limited examinations for reoccurring indications may be performed as noted.   Right Venous Findings: +---------+---------------+---------+-----------+----------+-------+          CompressibilityPhasicitySpontaneityPropertiesSummary +---------+---------------+---------+-----------+----------+-------+ CFV      Partial        Yes      Yes                          +---------+---------------+---------+-----------+----------+-------+ SFJ      Partial                                              +---------+---------------+---------+-----------+----------+-------+  FV Prox  Partial                                              +---------+---------------+---------+-----------+----------+-------+ FV Mid   Partial                                              +---------+---------------+---------+-----------+----------+-------+ FV DistalPartial        Yes      Yes                          +---------+---------------+---------+-----------+----------+-------+ PFV      Partial                                              +---------+---------------+---------+-----------+----------+-------+ POP      Partial        Yes      Yes                          +---------+---------------+---------+-----------+----------+-------+ PTV      Partial                                              +---------+---------------+---------+-----------+----------+-------+ PERO     Partial                                              +---------+---------------+---------+-----------+----------+-------+  Left Venous Findings: +---------+---------------+---------+-----------+----------+-------+          CompressibilityPhasicitySpontaneityPropertiesSummary +---------+---------------+---------+-----------+----------+-------+ CFV      Partial        Yes      Yes                          +---------+---------------+---------+-----------+----------+-------+ SFJ      Partial                                               +---------+---------------+---------+-----------+----------+-------+ FV Prox  Partial                                              +---------+---------------+---------+-----------+----------+-------+ FV Mid   Partial        Yes      Yes                          +---------+---------------+---------+-----------+----------+-------+ FV DistalPartial                                              +---------+---------------+---------+-----------+----------+-------+  PFV      Partial                                              +---------+---------------+---------+-----------+----------+-------+ POP      Partial        Yes      Yes                          +---------+---------------+---------+-----------+----------+-------+ PTV      Partial                                              +---------+---------------+---------+-----------+----------+-------+ PERO     Partial                                              +---------+---------------+---------+-----------+----------+-------+ DVT appears improved BLE since last study 07/30/18   Summary: Right: Findings consistent with age indeterminate deep vein thrombosis involving the right common femoral vein, right femoral vein, right proximal profunda vein, right popliteal vein, right posterior tibial vein, and right peroneal vein. 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  *See table(s) above for measurements and observations. Electronically signed by Deitra Mayo MD on 08/29/2018 at 4:34:02 PM.    Final     Scheduled Meds: . sodium chloride   Intravenous Once  . sodium chloride   Intravenous Once  . apixaban  5 mg Oral BID  . bismuth subsalicylate  30 mL Oral TID AC & HS  . clonazePAM  0.5 mg Oral BID  . famotidine  20 mg Oral  Daily  . feeding supplement (ENSURE ENLIVE)  237 mL Oral TID BM  . furosemide  40 mg Oral BID  . gabapentin  300 mg Oral TID  . insulin aspart  0-15 Units Subcutaneous TID WC  . insulin aspart  0-5 Units Subcutaneous QHS  . insulin glargine  15 Units Subcutaneous QHS  . magic mouthwash w/lidocaine  5 mL Oral TID  . mouth rinse  15 mL Mouth Rinse BID  . multivitamin with minerals  1 tablet Oral Daily  . potassium chloride  40 mEq Oral Daily  . sodium chloride flush  10-40 mL Intracatheter Q12H    Continuous Infusions: . sodium chloride 10 mL/hr at 08/15/18 0200     LOS: 83 days     Desiree Hane, MD [facility] Pager [phone]  If 7PM-7AM, please contact night-coverage [email or web address] Password [facility] 08/29/2018, 10:23 PM

## 2018-08-29 NOTE — Social Work (Addendum)
4:15pm- CSW shared approval with pt, he was able to call parents while CSW was in the room and we discussed next steps that would be pursued when pt was medically stable.  2:13pm- [facility], pt has been approved [facility] rehab [facility] in [place]. When pt is medically cleared and stable for discharge CSW will send information to Kemper DurieMarta Cheeks, RN Clinical Coordinator at [facility] at [phone] and they will arrange admission and transportation.   12:45am- Message received from [name] at [facility]. Returned message. Let pt know what was going on with the referral, he requests that if he cannot go to [facility] that his referral be sent to [facility] here in [place].   [name] GravesIsabel Athea [name], MSW, [facility] [place] Clinical Social Work [phone]

## 2018-08-30 LAB — BASIC METABOLIC PANEL
Anion gap: 13 (ref 5–15)
BUN: 25 mg/dL — ABNORMAL HIGH (ref 6–20)
CO2: 30 mmol/L (ref 22–32)
Calcium: 9.8 mg/dL (ref 8.9–10.3)
Chloride: 97 mmol/L — ABNORMAL LOW (ref 98–111)
Creatinine, Ser: 0.81 mg/dL (ref 0.61–1.24)
GFR calc Af Amer: >60 mL/min (ref >60)
GFR calc non Af Amer: >60 mL/min (ref >60)
GLUCOSE: 114 mg/dL — AB (ref 70–99)
Potassium: 3.8 mmol/L (ref 3.5–5.1)
Sodium: 140 mmol/L (ref 135–145)

## 2018-08-30 LAB — CBC
HCT: 36.5 % — ABNORMAL LOW (ref 39.0–52.0)
Hemoglobin: 11 g/dL — ABNORMAL LOW (ref 13.0–17.0)
MCH: 28.7 pg (ref 26.0–34.0)
MCHC: 30.1 g/dL (ref 30.0–36.0)
MCV: 95.3 fL (ref 80.0–100.0)
Platelets: 163 10*3/uL (ref 150–400)
RBC: 3.83 MIL/uL — ABNORMAL LOW (ref 4.22–5.81)
RDW: 17.5 % — ABNORMAL HIGH (ref 11.5–15.5)
WBC: 14.8 10*3/uL — AB (ref 4.0–10.5)
nRBC: 0.3 % — ABNORMAL HIGH (ref 0.0–0.2)

## 2018-08-30 LAB — GLUCOSE, CAPILLARY
Glucose-Capillary: 87 mg/dL (ref 70–99)
Glucose-Capillary: 103 mg/dL — ABNORMAL HIGH (ref 70–99)
Glucose-Capillary: 131 mg/dL — ABNORMAL HIGH (ref 70–99)
Glucose-Capillary: 304 mg/dL — ABNORMAL HIGH (ref 70–99)

## 2018-08-30 LAB — SEDIMENTATION RATE: Sed Rate: 135 mm/hr — ABNORMAL HIGH (ref 0–16)

## 2018-08-30 LAB — C-REACTIVE PROTEIN: CRP: 20.6 mg/dL — AB (ref <1.0)

## 2018-08-30 MED ORDER — GABAPENTIN 300 MG PO CAPS
600.0000 mg | ORAL_CAPSULE | Freq: Three times a day (TID) | ORAL | Status: DC
  Administered 2018-08-30 – 2018-09-10 (×35): 600 mg via ORAL
  Filled 2018-08-30 (×35): qty 2

## 2018-08-30 MED ORDER — INSULIN ASPART 100 UNIT/ML ~~LOC~~ SOLN
0.0000 [IU] | Freq: Three times a day (TID) | SUBCUTANEOUS | Status: DC
  Administered 2018-08-30: 3 [IU] via SUBCUTANEOUS
  Administered 2018-08-31 (×3): 4 [IU] via SUBCUTANEOUS
  Administered 2018-09-01 (×2): 3 [IU] via SUBCUTANEOUS
  Administered 2018-09-01: 4 [IU] via SUBCUTANEOUS
  Administered 2018-09-02: 7 [IU] via SUBCUTANEOUS
  Administered 2018-09-02: 4 [IU] via SUBCUTANEOUS
  Administered 2018-09-02 – 2018-09-04 (×2): 3 [IU] via SUBCUTANEOUS
  Administered 2018-09-04: 7 [IU] via SUBCUTANEOUS
  Administered 2018-09-05 – 2018-09-06 (×3): 3 [IU] via SUBCUTANEOUS
  Administered 2018-09-06: 4 [IU] via SUBCUTANEOUS
  Administered 2018-09-07: 3 [IU] via SUBCUTANEOUS
  Administered 2018-09-09: 0 [IU] via SUBCUTANEOUS

## 2018-08-30 MED ORDER — BACLOFEN 10 MG PO TABS
10.0000 mg | ORAL_TABLET | Freq: Two times a day (BID) | ORAL | Status: DC
  Administered 2018-08-30 – 2018-09-10 (×23): 10 mg via ORAL
  Filled 2018-08-30 (×23): qty 1

## 2018-08-30 MED ORDER — INSULIN ASPART 100 UNIT/ML ~~LOC~~ SOLN
0.0000 [IU] | Freq: Every day | SUBCUTANEOUS | Status: DC
  Administered 2018-08-30: 5 [IU] via SUBCUTANEOUS
  Administered 2018-09-01: 2 [IU] via SUBCUTANEOUS

## 2018-08-30 MED ORDER — METHYLPREDNISOLONE SODIUM SUCC 40 MG IJ SOLR
40.0000 mg | Freq: Two times a day (BID) | INTRAMUSCULAR | Status: DC
  Administered 2018-08-30 – 2018-09-02 (×6): 40 mg via INTRAVENOUS
  Filled 2018-08-30 (×6): qty 1

## 2018-08-30 NOTE — Progress Notes (Signed)
Pt now c/o of medial distal RUE tenderness to touch and slightly edematous.  At this time elevated on pillow and relayed to HS RN. Patient to notify staff if notices worsening.

## 2018-08-30 NOTE — Progress Notes (Signed)
PROGRESS NOTE    [name] [name]  FUX:[id number] DOB: 02/06/1966 DOA: 06/06/2018 PCP: Helane Rima, MD    Brief Narrative:  52 y.o. year old male with medical history significant for gout, hypertension, underwent anterior cervical corpectomy of C4 on 0/25/4270 with complicated postoperative course including development of multiple PEs and DVTs started on anticoagulation and subsequently developed  spinal hematoma requiring reexploration on 03/28/2018 status post placement of IVC filter then discharged to SNF who presented on 06/06/2018 as a transfer from [facility] with cervical spine MRI showing changes consistent with cord compression, cord edema and concern for ischemia.  His hospital course has been complicated by cervical decompression laminectomy, postoperative hematomas and multiple evacuations, new bilateral lower extremity DVTs, epidural hematoma (62/37, 62/83) complicating anticoagulation therapy (briefly on argatroban and discontinued given recurrent hematomas), new PE now stable on eliquis, bone marrow biopsy for evaluation of thrombocytopenia, platelet and endplate transfusions.  Cervical spine 08/19/18 MRI which neurosurgery evaluated and deemed it looked stable and recommended no further work-up. Patient has been stable throughout this week until spike a fever to 102 overnight on 12/15 followed by another fever overnight on 12/16.  In both settings patient was noted to be somewhat altered with symptoms of disorientation but no new neurologic deficits.  We will work-up is included negative chest x-ray, no acute abnormalities on CT abdomen, no repeat or worsening DVTs on lower extremity venous duplex and no growth on blood cultures x2 obtain on 12/16.  Assessment & Plan:   Principal Problem:   Recurrent pulmonary embolism (HCC) Active Problems:   Myelopathy (HCC)   Pressure injury of skin   Thrombocytopenia (HCC)   Acute blood loss anemia   Benign essential HTN   Cord  compression (HCC)   Fever   Spondylosis, cervical, with myelopathy   Labile blood pressure   Orthostatic hypotension   Tachycardia-bradycardia syndrome (HCC)   Adjustment disorder with mixed anxiety and depressed mood   G6PD deficiency   G6PD deficiency anemia (HCC)   Ascending aortic aneurysm (HCC)   Difficult intravenous access   DVT (deep venous thrombosis) (HCC)   SIRS (systemic inflammatory response syndrome) (HCC)  #Fevers.   -Noted to have recurrent fevers with T-max of 103  -Infectious work-up has been unremarkable blood culture still negative and no acute abnormalities on chest x-ray or CT of abdomen.   -Concerns for marked acute gout flare which could result in fevers. See below  #Severe cervical spine stenosis, with quadriparesis stable.  Stable quadriparesis, right upper extremity stronger than left upper extremity (consistent with prior exams based on chart review by neurosurgery evaluation) status post decompressive surgery complicated by multiple postoperative hematomas and evacuations.  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  Case earlier discussed with neurosurgery who deemed slight improvement from prior films since surgery, no further plans for surgery given neurologically stable and very high risk for further bleeding in the cervical area per NSG.   will need aggressive rehabilitation with SNF(previously considered for [facility]), social worker assisting, currently looking into spine rehab center with the [place].  #Acute PE/DVT in patient with history of PE and DVT, stable.  Has IVC filter (03/2018).  Has had multiple events of postoperative epidural hematomas which led to holding his home anticoagulation, found to have acute DVT in lower extremities on 10/14 while holding anticoagulation, started on bivalirudin 11/13 for new submassive PE, found to have DVTs in left upper extremities on that treatment  so  switched to argatroban 11/29 (per hematology), back on his home regimen of Eliquis since 12/4. Stable hemoglobin on CBC, no current oxygen requirements on room air.  No signs or symptoms of bleeding.  Stable at this time  #Edema in bilateral lower extremities and upper left extremity, stable.  Suspect this is most likely combination of dependent edema and third spacing in the setting of hypoalbuminemia. Noted to have 17.3 L out since admission.  No history of CHF, anticipate improvement as patient is getting massage/muscle activation with physical therapy, frequent turnings.  Doing well on oral Lasix therapy.  Continue scheduled potassium, monitor BMP I's and O's, daily weight  #Incidental finding of pneumoperitoneum on 11/23, resolved.  Free air on abdominal imaging on 11/21,  abd xr and repeat CT abdomen cited pneumoperitoneum.  Upper GI series on 11/25 was negative for any perforation.  Patient has remained clinically stable without nausea, vomiting or worsening abdominal pain.  Continue PPI for stress ulcer prophylaxis.  Completed a course of antibiotics (last dose of cefepime 12/3) during hospital stay. Stable at this time  #Thrombocytopenia, resolved.  Extensive work-up including bone marrow biopsy, initiation of multiple platelet transfusions with assistance from Dr. Marin Olp with no  clear etiology.  Briefly required Argatroban drip due to concern for possible HIT (HIT panel was mildly elevated at 0.486, serotonin release assay was negative), he was then transitioned to bivalirudin because of bradycardia (10/23) that resolved with discontinuation of our argatroban. Currently stable without evidence of bleeding  #Orthostatic hypotension with near syncope, improving likely multifactorial related to hypovolemia from earlier hospital stay and autonomic dysfunction.  TTE shows grade 2 diastolic dysfunction currently not volume overloaded.  Previously on Midrin which was decreased due to bradycardia and  discontinued on 12/4.  Presently stable  #Loose stool.  Patient denies any current gout-like symptoms.  Discontinued colchicine.  No other medications that could be contributing.  Denies any abdominal pain nausea or vomiting. No documented stool output thus far today  #Anxiety/depression, stable.  Continue scheduled Klonopin  #Type 2 diabetes.  A1c 5.8 continue Lantus 15 units, continue sliding scale  #Hypokalemia, resolved.  Currently on maintenance dose potassium, while on oral Lasix.  Continue to monitor BMP.  #Anemia, acute blood loss anemia in setting of multiple hematoma/evacuations while on anticoagulation.  Currently stable with hemoglobin range of 10-11.  Repeat cbc in AM  #Sinus bradycardia, resolved.  Thought to be secondary to argatroban drip, cardiology was consulted (10/23)and recommended discontinuation  drip, heart rate improved afterwards.  Additionally Midrin was also felt to be contributing, since discontinued on 12/4. HR of just over 100's this AM  #Hypertension, improving close to goal.  Most recent BP 132/73 . continue to monitor, continue PRN hydralazine as needed  #Stage II pressure ulcer on the right ischial tuberosity, not present on admission.  Wound care consulted and following recommendations   #Severe OA of left knee.  Pain resolved.  Discontinued colchicine given loose stool.  Continue oxycodone for as needed relief, IV morphine for breakthrough.  #Acute gout flare of L ankle and large toe -Ordered and reviewed ESR and CRP, noted to be over 130's and 20's respectively -Will start q12hr solumedrol  DVT prophylaxis: Eliquis Code Status: Full Family Communication: Pt in room, family not at bedside Disposition Plan: Uncertain at this time  Consultants:   Hematology/oncology, cardiology, neurosurgery, surgery  Procedures:  On 10/2 1. posterior cervical decompressive laminectomy at C3, C4, C5 with removal of the spinous process and complete  laminectomy and foraminotomies of the C3, C4, C5 nerve roots. 2. Posterior cervical fusion with lateral mass screws at C3-C4-C5 and pars screws at C2 lies in the globus ellipseLateral mass screw system 3. Posterior lateral arthrodesis C2-C5 utilizing locally harvested autograft mixed with vivigen  06/22/2018:(Preceded by symptoms of worsening left-sided paresthesia weakness, MRI noted postoperative hematoma) Procedure reexploration posterior cervical wound for evacuation of epidural hematoma  06/26/2018 DVT ultrasound:Right: Findings consistent with acute deep vein thrombosis involving the right common femoral vein, right femoral vein, right popliteal vein, right posterior tibial vein, and right peroneal vein. Thrombosis extends proximally into external iliac vein.  Proximal iliac vein not visualized. Left: Findings consistent with acute deep vein thrombosis involving the left common femoral vein, left femoral vein, left popliteal vein, left posterior tibial vein, and left peroneal vein. Findings consistent with acute superficial vein thrombosis  involving the left great saphenous vein. Thrombosis extends proximally into external iliac vein. Proximal iliac vein not visualized.  07/01/18 (symptoms of decreased left hand grip, MRI showed large epidural hematoma) requiring operative clot evacuation   07/02/18 RUE doppler, no DVT in right upper extremity  10/28/2019Bilateral lower extremity Doppler, acute DVT in right, acute DVT in left common femoral vein, left proximal profunda vein, left femoral vein, left popliteal vein, left posterior tibial vein, and left peroneal vein  TTE, 07/27/2018, EF 29-51%, grade 1 diastolic dysfunction  88/41/6606 bilateral lower extremity Doppler, DVT in bilateral saphenofemoral junctions, common femoral, femoral, popliteal, posterior tibial, and peroneal veins  08/10/2018 Left upper extremity duplex, acute DVT in left subclavian veins, left axillary  vein, left brachial vein and left radial veins.  Acute superficial vein thrombosis involving the left basilar vein and left cephalic vein.  08/29/2018 bilateral lower extremity duplex Age indeterminate DVT in the right common femoral vein, right femoral vein, right proximal profunda vein, right popliteal vein, right posterior tibial vein, and right peroneal vein.  And age-indeterminate left DVT involving the left common femoral vein, left femoral vein, left proximal profunda vein, left popliteal vein, left posterior tibial vein, and left peroneal vein   Antimicrobials: Anti-infectives (From admission, onward)   Start     Dose/Rate Route Frequency Ordered Stop   08/13/18 1000  ceFEPIme (MAXIPIME) 1 g in sodium chloride 0.9 % 100 mL IVPB     1 g 200 mL/hr over 30 Minutes Intravenous Every 8 hours 08/13/18 0949 08/14/18 2259   08/05/18 1415  vancomycin (VANCOCIN) IVPB 1000 mg/200 mL premix  Status:  Discontinued     1,000 mg 200 mL/hr over 60 Minutes Intravenous Every 8 hours 08/05/18 1410 08/08/18 1257   08/05/18 1415  ceFEPIme (MAXIPIME) 1 g in sodium chloride 0.9 % 100 mL IVPB     1 g 200 mL/hr over 30 Minutes Intravenous Every 8 hours 08/05/18 1410 08/12/18 0544   08/04/18 0645  levofloxacin (LEVAQUIN) IVPB 500 mg  Status:  Discontinued     500 mg 100 mL/hr over 60 Minutes Intravenous Every 24 hours 08/04/18 0642 08/05/18 1316   07/25/18 1700  fluconazole (DIFLUCAN) IVPB 400 mg  Status:  Discontinued     400 mg 100 mL/hr over 120 Minutes Intravenous Every 24 hours 07/25/18 1633 07/27/18 1415   07/12/18 2100  ceFAZolin (ANCEF) IVPB 2g/100 mL premix  Status:  Discontinued     2 g 200 mL/hr over 30 Minutes Intravenous Every 8 hours 07/12/18 1555 07/15/18 1050   07/10/18 2229  bacitracin 50,000 Units in sodium chloride 0.9 % 500 mL irrigation  Status:  Discontinued       As needed 07/10/18 2230 07/10/18 2300   07/07/18 1133  bacitracin 50,000 Units in sodium chloride 0.9 % 500 mL  irrigation  Status:  Discontinued       As needed 07/07/18 1133 07/07/18 1357   07/06/18 0900  ceFAZolin (ANCEF) IVPB 2g/100 mL premix  Status:  Discontinued     2 g 200 mL/hr over 30 Minutes Intravenous Every 8 hours 07/06/18 0810 07/12/18 1555   07/01/18 2240  bacitracin 50,000 Units in sodium chloride 0.9 % 500 mL irrigation  Status:  Discontinued       As needed 07/01/18 2240 07/01/18 2324   06/30/18 1330  doxycycline (VIBRA-TABS) tablet 100 mg  Status:  Discontinued     100 mg Oral Every 12 hours 06/30/18 1329 07/12/18 1429   06/27/18 1000  fluconazole (DIFLUCAN) tablet 200 mg  Status:  Discontinued     200 mg Oral Daily 06/27/18 0725 07/04/18 0720   06/22/18 2315  ceFAZolin (ANCEF) IVPB 2g/100 mL premix     2 g 200 mL/hr over 30 Minutes Intravenous Every 8 hours 06/22/18 2314 06/27/18 1519   06/22/18 1828  bacitracin 50,000 Units in sodium chloride 0.9 % 500 mL irrigation  Status:  Discontinued       As needed 06/22/18 1828 06/22/18 1915   06/22/18 1700  ceFAZolin (ANCEF) 3 g in dextrose 5 % 50 mL IVPB  Status:  Discontinued     3 g 100 mL/hr over 30 Minutes Intravenous  Once 06/22/18 1647 06/22/18 2101   06/14/18 2100  ceFAZolin (ANCEF) IVPB 2g/100 mL premix     2 g 200 mL/hr over 30 Minutes Intravenous Every 8 hours 06/14/18 1712 06/17/18 0800   06/14/18 1414  bacitracin 50,000 Units in sodium chloride 0.9 % 500 mL irrigation  Status:  Discontinued       As needed 06/14/18 1414 06/14/18 1647   06/14/18 1215  ceFAZolin (ANCEF) 3 g in dextrose 5 % 50 mL IVPB     3 g 160 mL/hr over 30 Minutes Intravenous To [facility] 06/14/18 1202 06/14/18 2225   06/14/18 1200  ceFAZolin (ANCEF) IVPB 2g/100 mL premix  Status:  Discontinued     2 g 200 mL/hr over 30 Minutes Intravenous On call to O.R. 06/14/18 1157 06/14/18 2140       Subjective: Complaining of marked L ankle and toe pain  Objective: Vitals:   08/30/18 0400 08/30/18 0449 08/30/18 0748 08/30/18 1148  BP:   139/84  126/80  Pulse:    (!) 119  Resp:   20   Temp: 100.2 F (37.9 C)  98.9 F (37.2 C) 99.4 F (37.4 C)  TempSrc: Oral  Oral Oral  SpO2:    92%  Weight: 121.1 kg 121.1 kg    Height:        Intake/Output Summary (Last 24 hours) at 08/30/2018 1609 Last data filed at 08/30/2018 1205 Gross per 24 hour  Intake 360 ml  Output 1350 ml  Net -990 ml   Filed Weights   08/29/18 0309 08/30/18 0400 08/30/18 0449  Weight: 124.4 kg 121.1 kg 121.1 kg    Examination:  General exam: Appears calm and comfortable  Respiratory system: Clear to auscultation. Respiratory effort normal. Cardiovascular system: S1 & S2 heard, RRR. No JVD, murmurs, rubs, gallops or clicks. No pedal edema. Gastrointestinal system: Abdomen is nondistended, soft and nontender. No organomegaly or masses felt. Normal bowel sounds heard. Central nervous system: Alert and oriented.  No focal neurological deficits. Extremities: Symmetric 5 x 5 power. L ankle with pain and swelling Skin: No rashes, lesions or ulcers Psychiatry: Judgement and insight appear normal. Mood & affect appropriate.   Data Reviewed: I have personally reviewed following labs and imaging studies  CBC: Recent Labs  Lab 08/26/18 1159 08/28/18 0728 08/29/18 0718 08/29/18 1654 08/30/18 0356  WBC 15.0* 16.0* 16.3* 16.0* 14.8*  HGB 12.4* 11.5* 11.6* 11.4* 11.0*  HCT 41.2 38.8* 37.9* 36.3* 36.5*  MCV 96.9 96.5 95.9 94.3 95.3  PLT 212 180 150 170 542   Basic Metabolic Panel: Recent Labs  Lab 08/27/18 2109 08/28/18 0728 08/29/18 0718 08/29/18 1533 08/30/18 0356  NA 140 139 141 141 140  K 4.4 3.5 3.6 4.0 3.8  CL 99 95* 98 96* 97*  CO2 _0 GLUCOSE 148* 128* 124* 119* 114*  BUN 25* 22* 22* 27* 25*  CREATININE 0.74 0.72 0.80 0.72 0.81  CALCIUM 9.6 9.5 9.7 9.9 9.8   GFR: Estimated Creatinine Clearance: 137.2 mL/min (by C-G formula based on SCr of 0.81 mg/dL). Liver Function Tests: No results for input(s): AST, ALT, ALKPHOS, BILITOT,  PROT, ALBUMIN in the last 168 hours. No results for input(s): LIPASE, AMYLASE in the last 168 hours. No results for input(s): AMMONIA in the last 168 hours. Coagulation Profile: No results for input(s): INR, PROTIME in the last 168 hours. Cardiac Enzymes: No results for input(s): CKTOTAL, CKMB, CKMBINDEX, TROPONINI in the last 168 hours. BNP (last 3 results) No results for input(s): PROBNP in the last 8760 hours. HbA1C: No results for input(s): HGBA1C in the last 72 hours. CBG: Recent Labs  Lab 08/29/18 1148 08/29/18 1618 08/29/18 2111 08/30/18 0751 08/30/18 1145  GLUCAP 107* 102* 120* 87 103*   Lipid Profile: No results for input(s): CHOL, HDL, LDLCALC, TRIG, CHOLHDL, LDLDIRECT in the last 72 hours. Thyroid Function Tests: No results for input(s): TSH, T4TOTAL, FREET4, T3FREE, THYROIDAB in the last 72 hours. Anemia Panel: No results for input(s): VITAMINB12, FOLATE, FERRITIN, TIBC, IRON, RETICCTPCT in the last 72 hours. Sepsis Labs: No results for input(s): PROCALCITON, LATICACIDVEN in the last 168 hours.  Recent Results (from the past 240 hour(s))  Culture, blood (Routine X 2) w Reflex to ID Panel     Status: None (Preliminary result)   Collection Time: 08/28/18  7:28 AM  Result Value Ref Range Status   Specimen Description BLOOD LEFT HAND  Final   Special Requests   Final    BOTTLES DRAWN AEROBIC ONLY Blood Culture adequate volume   Culture   Final    NO GROWTH 2 DAYS Performed at [facility], [address], [place], [place] [address]    Report Status PENDING  Incomplete  Culture, blood (Routine X 2) w Reflex to ID Panel     Status: None (Preliminary result)   Collection Time: 08/28/18  7:35 AM  Result Value Ref Range Status   Specimen Description BLOOD RIGHT HAND  Final   Special Requests   Final    BOTTLES DRAWN AEROBIC AND ANAEROBIC Blood Culture adequate volume   Culture   Final    NO GROWTH 2 DAYS Performed at [facility], [place] [address]  [place], [place] [address]    Report Status PENDING  Incomplete     Radiology Studies: Ct Abdomen Pelvis W Contrast  Result Date: 08/29/2018 CLINICAL DATA:  52 y/o  M; intermittent fever. Stable quadriparesis. EXAM: CT ABDOMEN AND PELVIS WITH CONTRAST  TECHNIQUE: Multidetector CT imaging of the abdomen and pelvis was performed using the standard protocol following bolus administration of intravenous contrast. CONTRAST:  156m OMNIPAQUE IOHEXOL 300 MG/ML  SOLN COMPARISON:  08/06/2018 CT abdomen and pelvis. FINDINGS: Lower chest: Platelike atelectasis or scarring at the right lung base. Elevated left hemidiaphragm. Hepatobiliary: No focal liver abnormality is seen. No gallstones, gallbladder wall thickening, or biliary dilatation. Pancreas: Unremarkable. No pancreatic ductal dilatation or surrounding inflammatory changes. Spleen: Normal in size without focal abnormality. Adrenals/Urinary Tract: Adrenal glands are unremarkable. Kidneys are normal, without renal calculi, focal lesion, or hydronephrosis. Bladder is unremarkable. Stomach/Bowel: Stomach and splenic flexure of colon are above the field of view. Visible stomach is within normal limits. Appendix appears normal. No evidence of bowel wall thickening, distention, or inflammatory changes. Vascular/Lymphatic: IVC filter in situ. No significant vascular findings. Reproductive: Prostate is unremarkable. Other: No abdominal wall hernia or abnormality. No abdominopelvic ascites. Musculoskeletal: No fracture is seen. Mild-to-moderate spondylosis of the visible spine greatest at L4-5 where there is loss of intervertebral disc space height and endplate Schmorl's nodes. IMPRESSION: 1. Elevated left hemidiaphragm. Stomach and splenic flexure of colon or above the field of view. 2. No acute process identified. Unremarkable CT of abdomen and pelvis. Electronically Signed   By: LKristine GarbeM.D.   On: 08/29/2018 21:15   Vas UKoreaLower Extremity Venous  (dvt)  Result Date: 08/29/2018  Lower Venous Study Indications: Extensive DVT since 06/26/18, unable to anticoagulate at first due to bleed risk. [facility]  Performing Technologist: JLandry MellowRDMS, RVT  Examination Guidelines: A complete evaluation includes B-mode imaging, spectral Doppler, color Doppler, and power Doppler as needed of all accessible portions of each vessel. Bilateral testing is considered an integral part of a complete examination. Limited examinations for reoccurring indications may be performed as noted.  Right Venous Findings: +---------+---------------+---------+-----------+----------+-------+          CompressibilityPhasicitySpontaneityPropertiesSummary +---------+---------------+---------+-----------+----------+-------+ CFV      Partial        Yes      Yes                          +---------+---------------+---------+-----------+----------+-------+ SFJ      Partial                                              +---------+---------------+---------+-----------+----------+-------+ FV Prox  Partial                                              +---------+---------------+---------+-----------+----------+-------+ FV Mid   Partial                                              +---------+---------------+---------+-----------+----------+-------+ FV DistalPartial        Yes      Yes                          +---------+---------------+---------+-----------+----------+-------+ PFV      Partial                                              +---------+---------------+---------+-----------+----------+-------+  POP      Partial        Yes      Yes                          +---------+---------------+---------+-----------+----------+-------+ PTV      Partial                                              +---------+---------------+---------+-----------+----------+-------+ PERO     Partial                                               +---------+---------------+---------+-----------+----------+-------+  Left Venous Findings: +---------+---------------+---------+-----------+----------+-------+          CompressibilityPhasicitySpontaneityPropertiesSummary +---------+---------------+---------+-----------+----------+-------+ CFV      Partial        Yes      Yes                          +---------+---------------+---------+-----------+----------+-------+ SFJ      Partial                                              +---------+---------------+---------+-----------+----------+-------+ FV Prox  Partial                                              +---------+---------------+---------+-----------+----------+-------+ FV Mid   Partial        Yes      Yes                          +---------+---------------+---------+-----------+----------+-------+ FV DistalPartial                                              +---------+---------------+---------+-----------+----------+-------+ PFV      Partial                                              +---------+---------------+---------+-----------+----------+-------+ POP      Partial        Yes      Yes                          +---------+---------------+---------+-----------+----------+-------+ PTV      Partial                                              +---------+---------------+---------+-----------+----------+-------+ PERO     Partial                                              +---------+---------------+---------+-----------+----------+-------+  DVT appears improved BLE since last study 07/30/18   Summary: Right: Findings consistent with age indeterminate deep vein thrombosis involving the right common femoral vein, right femoral vein, right proximal profunda vein, right popliteal vein, right posterior tibial vein, and right peroneal vein. Left: Findings consistent with age indeterminate deep vein thrombosis involving the left common femoral  vein, left femoral vein, left proximal profunda vein, left popliteal vein, left posterior tibial vein, and left peroneal vein.  *See table(s) above for measurements and observations. Electronically signed by Deitra Mayo MD on 08/29/2018 at 4:34:02 PM.    Final     Scheduled Meds: . sodium chloride   Intravenous Once  . sodium chloride   Intravenous Once  . apixaban  5 mg Oral BID  . baclofen  10 mg Oral BID  . bismuth subsalicylate  30 mL Oral TID AC & HS  . clonazePAM  0.5 mg Oral BID  . famotidine  20 mg Oral Daily  . feeding supplement (ENSURE ENLIVE)  237 mL Oral TID BM  . furosemide  40 mg Oral BID  . gabapentin  600 mg Oral TID  . insulin aspart  0-20 Units Subcutaneous TID WC  . insulin aspart  0-5 Units Subcutaneous QHS  . insulin glargine  15 Units Subcutaneous QHS  . magic mouthwash w/lidocaine  5 mL Oral TID  . mouth rinse  15 mL Mouth Rinse BID  . methylPREDNISolone (SOLU-MEDROL) injection  40 mg Intravenous Q12H  . multivitamin with minerals  1 tablet Oral Daily  . potassium chloride  40 mEq Oral Daily  . sodium chloride flush  10-40 mL Intracatheter Q12H   Continuous Infusions: . sodium chloride 10 mL/hr at 08/15/18 0200     LOS: 84 days   Marylu Lund, MD [facility] Pager On Amion  If 7PM-7AM, please contact night-coverage 08/30/2018, 4:09 PM

## 2018-08-31 LAB — BASIC METABOLIC PANEL
Anion gap: 15 (ref 5–15)
BUN: 32 mg/dL — ABNORMAL HIGH (ref 6–20)
CO2: 28 mmol/L (ref 22–32)
Calcium: 10 mg/dL (ref 8.9–10.3)
Chloride: 95 mmol/L — ABNORMAL LOW (ref 98–111)
Creatinine, Ser: 0.79 mg/dL (ref 0.61–1.24)
GFR calc Af Amer: >60 mL/min (ref >60)
GFR calc non Af Amer: >60 mL/min (ref >60)
Glucose, Bld: 175 mg/dL — ABNORMAL HIGH (ref 70–99)
Potassium: 4.5 mmol/L (ref 3.5–5.1)
Sodium: 138 mmol/L (ref 135–145)

## 2018-08-31 LAB — GLUCOSE, CAPILLARY
Glucose-Capillary: 173 mg/dL — ABNORMAL HIGH (ref 70–99)
Glucose-Capillary: 195 mg/dL — ABNORMAL HIGH (ref 70–99)
Glucose-Capillary: 179 mg/dL — ABNORMAL HIGH (ref 70–99)
Glucose-Capillary: 167 mg/dL — ABNORMAL HIGH (ref 70–99)

## 2018-08-31 LAB — CBC
HCT: 33.2 % — ABNORMAL LOW (ref 39.0–52.0)
Hemoglobin: 10.4 g/dL — ABNORMAL LOW (ref 13.0–17.0)
MCH: 29.8 pg (ref 26.0–34.0)
MCHC: 31.3 g/dL (ref 30.0–36.0)
MCV: 95.1 fL (ref 80.0–100.0)
Platelets: 174 10*3/uL (ref 150–400)
RBC: 3.49 MIL/uL — ABNORMAL LOW (ref 4.22–5.81)
RDW: 16.8 % — AB (ref 11.5–15.5)
WBC: 15.5 10*3/uL — ABNORMAL HIGH (ref 4.0–10.5)
nRBC: 0.2 % (ref 0.0–0.2)

## 2018-08-31 MED ORDER — OMEPRAZOLE 20 MG PO CPDR
20.0000 mg | DELAYED_RELEASE_CAPSULE | Freq: Two times a day (BID) | ORAL | Status: DC
  Administered 2018-08-31 – 2018-09-06 (×8): 20 mg via ORAL
  Filled 2018-08-31 (×22): qty 1

## 2018-08-31 MED ORDER — COLCHICINE 0.6 MG PO TABS
0.6000 mg | ORAL_TABLET | Freq: Two times a day (BID) | ORAL | Status: DC
  Administered 2018-08-31 – 2018-09-10 (×21): 0.6 mg via ORAL
  Filled 2018-08-31 (×21): qty 1

## 2018-08-31 MED ORDER — COLCHICINE 0.6 MG PO TABS
0.6000 mg | ORAL_TABLET | Freq: Three times a day (TID) | ORAL | Status: AC
  Administered 2018-08-31: 0.6 mg via ORAL
  Filled 2018-08-31: qty 1

## 2018-08-31 MED ORDER — KETOROLAC TROMETHAMINE 30 MG/ML IJ SOLN: 30.0000 mg | Freq: Three times a day (TID) | INTRAMUSCULAR | Status: DC

## 2018-08-31 MED ORDER — INDOMETHACIN 25 MG PO CAPS
25.0000 mg | ORAL_CAPSULE | Freq: Two times a day (BID) | ORAL | Status: DC
  Administered 2018-08-31 – 2018-09-11 (×22): 25 mg via ORAL
  Filled 2018-08-31 (×23): qty 1

## 2018-08-31 MED ORDER — DOCUSATE SODIUM 100 MG PO CAPS
100.0000 mg | ORAL_CAPSULE | Freq: Two times a day (BID) | ORAL | Status: DC | PRN
  Administered 2018-09-01 – 2018-09-10 (×6): 100 mg via ORAL
  Filled 2018-08-31 (×6): qty 1

## 2018-08-31 NOTE — Progress Notes (Signed)
Occupational Therapy Treatment Patient Details Name: [name] [name] MRN: [id number] DOB: 1966-01-29 Today's Date: 08/31/2018    History of present illness 52 y.o. male admitted on 06/06/18 for bil LE weakness and muscle spasm after multiple falls (with resultant R knee meniscus tear placed in bledsoe brace) following a recent ACDF at [facility] in July 2019 (complicated by PE (s/p IVC filter), CIR, and then SNF placement before finally returning home with his parents).  In the ED MRI of c-spine which showed post operative changes and cord edema and possibility of ischemia.  His R knee was aspirated on 06/09/18.  S/p posterior cervical decompression and fusion on 06/14/18. Entire acute course complicated by hypotension in standing (thought to be autonomic) and by finding of multiple LE DVTs (already has an IVC filter in place, but per MDs notes at risk for blocking the filter with these clots).  Pt with post op issues with incisional bleeding s/p 2 evacuations of hematomas and placement of hemovac (06/22/18 and 07/01/18) as well as multiple units of plasma.  Cardiology consulted 07/05/18 due to bradycardia thought to be from his newly started blood thinner.  Pt with another re-accumulation of epidural hematoma with increased L sided weakness and cord compression with emergent evacuation surgery 10/25 & 07/10/18.  Pt also needing more platelets post op.  Bone marrow bx 07/12/18.  Pt found to have submassive PE on 07/27/18 and re-started on anticoagulation. PMHx of HTN, gout, anemia, DDD (lumbar).  08/10/18 found to have a L UE thrombus (same arm as his PICC line).     OT comments  Pt with decreased pain levels today and improvements in activity tolerance. Pt tolerates sitting EOB >15 min with focus on lateral leans, wt bearing through bil UEs, scapular retraction to increase upright sitting posture. Continued focus on P/A/AAROM to bil UEs. Pt reports increased difficulty with use of R digits, educated pt in tendon  gliding exercises and handout provided. Will continue per POC.   Follow Up Recommendations  SNF;Supervision/Assistance - 24 hour    Equipment Recommendations  Other (comment)(TBD in next venue)          Precautions / Restrictions Precautions Precautions: Fall;Cervical;Other (comment) Precaution Comments: Several bed sores on bottom.   Required Braces or Orthoses: Cervical Brace Cervical Brace: Soft collar(PRN for comfort) Other Brace: bil PRAFOs Restrictions Weight Bearing Restrictions: No       Mobility Bed Mobility Overal bed mobility: Needs Assistance Bed Mobility: Rolling;Sidelying to Sit;Sit to Sidelying Rolling: +2 for physical assistance;Max assist;Total assist Sidelying to sit: +2 for physical assistance;Total assist;Max assist     Sit to sidelying: +2 for physical assistance;Total assist;Max assist General bed mobility comments: pt able to assist some with reaching and pushing through RUE to come into sitting; continues to require two person external assist for full mobility completion  Transfers                      Balance Overall balance assessment: Needs assistance Sitting-balance support: Feet supported;No upper extremity supported;Bilateral upper extremity supported;Single extremity supported Sitting balance-Leahy Scale: Zero Sitting balance - Comments: pt with improvements in sitting balance today compared to previous session, continues to require up to maxA for balance but able to activate truncal and UE muscles to assist; pt sat EOB >4815min, with additional focus on reaching/leaning towards R side, wt bearing through R/L elbow, focus on scapular retraction and rotating neck to L/R  ADL either performed or assessed with clinical judgement   ADL                                       Functional mobility during ADLs: Total assistance;+2 for physical assistance;+2 for  safety/equipment General ADL Comments: pt with improvements in sitting tolerance EOB     Vision       Perception     Praxis      Cognition Arousal/Alertness: Awake/alert Behavior During Therapy: WFL for tasks assessed/performed Overall Cognitive Status: Within Functional Limits for tasks assessed                                 General Comments: pt more awake/alert this session and appearing to have less confusion compared to previous treatment session; cognition overall appropriate         Exercises Exercises: General Lower Extremity;General Upper Extremity General Exercises - Upper Extremity Shoulder Flexion: PROM;Left;AAROM;Right(0-90*) Shoulder Horizontal ABduction: PROM;Left;Supine;AAROM;Right Shoulder Horizontal ADduction: PROM;Left;Supine;AAROM;Right Elbow Flexion: PROM;Left;Supine;AROM;Right Digit Composite Flexion: AROM;Both;10 reps Composite Extension: AROM;Both;10 reps Other Exercises Other Exercises: educated pt in R hand tendon gliding exercise with handout issued    Shoulder Instructions       General Comments      Pertinent Vitals/ Pain       Pain Assessment: Faces Faces Pain Scale: Hurts little more Pain Location: buttocks Pain Descriptors / Indicators: Sore Pain Intervention(s): Limited activity within patient's tolerance;Monitored during session;Repositioned  Home Living                                          Prior Functioning/Environment              Frequency  Min 2X/week        Progress Toward Goals  OT Goals(current goals can now be found in the care plan section)  Progress towards OT goals: Progressing toward goals  Acute Rehab OT Goals Patient Stated Goal: to have improvements in his strength so he can start standing again. OT Goal Formulation: With patient Time For Goal Achievement: 09/11/18 Potential to Achieve Goals: Fair  Plan Discharge plan remains appropriate     Co-evaluation    PT/OT/SLP Co-Evaluation/Treatment: Yes     OT goals addressed during session: Strengthening/ROM      AM-PAC OT "6 Clicks" Daily Activity     Outcome Measure   Help from another person eating meals?: A Little Help from another person taking care of personal grooming?: A Little Help from another person toileting, which includes using toliet, bedpan, or urinal?: Total Help from another person bathing (including washing, rinsing, drying)?: A Lot Help from another person to put on and taking off regular upper body clothing?: Total Help from another person to put on and taking off regular lower body clothing?: Total 6 Click Score: 11    End of Session Equipment Utilized During Treatment: Cervical collar  OT Visit Diagnosis: Other abnormalities of gait and mobility (R26.89);Muscle weakness (generalized) (M62.81)   Activity Tolerance Patient tolerated treatment well   Patient Left in bed;with call bell/phone within reach;with family/visitor present   Nurse Communication Mobility status        Time: 1450-1545 OT Time Calculation (min): 55 min  Charges: OT General Charges $  OT Visit: 1 Visit OT Treatments $Therapeutic Activity: 8-22 mins  Marcy SirenBreanna Zania Kalisz, OT [facility] Pager [phone] Office [phone]   Orlando PennerBreanna L Chavy [facility] 08/31/2018, 5:11 PM

## 2018-08-31 NOTE — Progress Notes (Addendum)
Physical Therapy Treatment Patient Details Name: [name] [name] MRN: [id number] DOB: May 16, 1966 Today's Date: 08/31/2018    History of Present Illness 52 y.o. male admitted on 06/06/18 for bil LE weakness and muscle spasm after multiple falls (with resultant R knee meniscus tear placed in bledsoe brace) following a recent ACDF at C[facility] in July 2019 (complicated by PE (s/p IVC filter), CIR, and then SNF placement before finally returning home with his parents).  In the ED MRI of c-spine which showed post operative changes and cord edema and possibility of ischemia.  His R knee was aspirated on 06/09/18.  S/p posterior cervical decompression and fusion on 06/14/18. Entire acute course complicated by hypotension in standing (thought to be autonomic) and by finding of multiple LE DVTs (already has an IVC filter in place, but per MDs notes at risk for blocking the filter with these clots).  Pt with post op issues with incisional bleeding s/p 2 evacuations of hematomas and placement of hemovac (06/22/18 and 07/01/18) as well as multiple units of plasma.  Cardiology consulted 07/05/18 due to bradycardia thought to be from his newly started blood thinner.  Pt with another re-accumulation of epidural hematoma with increased L sided weakness and cord compression with emergent evacuation surgery 10/25 & 07/10/18.  Pt also needing more platelets post op.  Bone marrow bx 07/12/18.  Pt found to have submassive PE on 07/27/18 and re-started on anticoagulation. PMHx of HTN, gout, anemia, DDD (lumbar).  08/10/18 found to have a L UE thrombus (same arm as his PICC line).      PT Comments    Pt was able to tolerate PT/OT co-session better today with decreased pain and increased alertness throughout the session.  He would like to start trying to stand again (I do not believe he has enough strength to be at all successful, but he may need a safe planned failure to continue to process his dx).  I think it would be safest to  attempt this again with OT's skilled help, possibly another set of hands and the electronic standing frame the [facility] (which they do have in the equipment closet on [address].  PT will continue to follow acutely for safe mobility progression.  I called to have his broken PRAFO replaced and to get him a new cervical soft collar as his is very soiled from use.   Follow Up Recommendations  SNF     Equipment Recommendations  Wheelchair (measurements PT);Wheelchair cushion (measurements PT);Other (comment);Hospital bed(20x20 tilt in space, power drive, roho, hoyer lift)    Recommendations for Other Services   NA     Precautions / Restrictions Precautions Precautions: Fall;Cervical;Other (comment) Precaution Comments: Several bed sores on bottom.   Required Braces or Orthoses: Cervical Brace Cervical Brace: Soft collar(PRN for comfort) Other Brace: bil PRAFOs Restrictions Weight Bearing Restrictions: No    Mobility  Bed Mobility Overal bed mobility: Needs Assistance Bed Mobility: Rolling;Sidelying to Sit;Sit to Sidelying Rolling: +2 for physical assistance;Max assist;Total assist Sidelying to sit: +2 for physical assistance;Total assist;Max assist     Sit to sidelying: +2 for physical assistance;Total assist;Max assist General bed mobility comments: pt able to assist some with reaching and pushing through RUE to come into sitting; continues to require two person external assist for full mobility completion and less pain with mobility today.       Balance Overall balance assessment: Needs assistance Sitting-balance support: Feet supported;No upper extremity supported;Bilateral upper extremity supported;Single extremity supported Sitting balance-Leahy Scale: Zero Sitting balance -  Comments: pt with improvements in sitting balance today compared to previous session, continues to require up to maxA for balance but able to activate truncal and UE muscles to assist; pt sat EOB >3515min, with  additional focus on reaching/leaning towards R side, wt bearing through R/L elbow, focus on scapular retraction and rotating neck to L/R.  More engagement and tolerance of sitting today.                                      Cognition Arousal/Alertness: Awake/alert Behavior During Therapy: WFL for tasks assessed/performed Overall Cognitive Status: Within Functional Limits for tasks assessed                                 General Comments: pt more awake/alert this session and appearing to have less confusion compared to previous treatment session; cognition overall appropriate       Exercises General Exercises - Upper Extremity Shoulder Flexion: PROM;Left;AAROM;Right(0-90*) Shoulder Horizontal ABduction: PROM;Left;Supine;AAROM;Right Shoulder Horizontal ADduction: PROM;Left;Supine;AAROM;Right Elbow Flexion: PROM;Left;Supine;AROM;Right Digit Composite Flexion: AROM;Both;10 reps Composite Extension: AROM;Both;10 reps General Exercises - Lower Extremity Ankle Circles/Pumps: PROM;Both;10 reps Heel Slides: PROM;Both;10 reps Hip ABduction/ADduction: PROM;Both;10 reps Other Exercises Other Exercises: educated pt in R hand tendon gliding exercise with handout issued     General Comments General comments (skin integrity, edema, etc.): Pt repquesting to attempt standing in standing frame next session.  I talked to him about the electric standing frame being of more help vs the standing frame he last used.  I don't think he will have enough strength without a significant amount of help, but would like to facilitate a try with the [employer] next session.  OT willing to help.       Pertinent Vitals/Pain Pain Assessment: Faces Faces Pain Scale: Hurts little more Pain Location: buttocks Pain Descriptors / Indicators: Sore Pain Intervention(s): Limited activity within patient's tolerance;Monitored during session;Repositioned           PT Goals (current goals can  now be found in the care plan section) Acute Rehab PT Goals Patient Stated Goal: to have improvements in his strength so he can start standing again. Progress towards PT goals: Progressing toward goals    Frequency    Min 2X/week      PT Plan Current plan remains appropriate    Co-evaluation PT/OT/SLP Co-Evaluation/Treatment: Yes Reason for Co-Treatment: Complexity of the patient's impairments (multi-system involvement);For patient/therapist safety;To address functional/ADL transfers PT goals addressed during session: Mobility/safety with mobility;Balance;Strengthening/ROM OT goals addressed during session: Strengthening/ROM      AM-PAC PT "6 Clicks" Mobility   Outcome Measure  Help needed turning from your back to your side while in a flat bed without using bedrails?: Total Help needed moving from lying on your back to sitting on the side of a flat bed without using bedrails?: Total Help needed moving to and from a bed to a chair (including a wheelchair)?: Total Help needed standing up from a chair using your arms (e.g., wheelchair or bedside chair)?: Total Help needed to walk in hospital room?: Total Help needed climbing 3-5 steps with a railing? : Total 6 Click Score: 6    End of Session Equipment Utilized During Treatment: Other (comment)(soft collar) Activity Tolerance: Patient tolerated treatment well Patient left: in bed;with call bell/phone within reach Nurse Communication: Mobility status PT Visit Diagnosis: Other abnormalities  of gait and mobility (R26.89);Other symptoms and signs involving the nervous system (R29.898);Muscle weakness (generalized) (M62.81)     Time: [id number] PT Time Calculation (min) (ACUTE ONLY): 42 min  Charges:  $Therapeutic Activity: 23-37 mins                    Jalynn Waddell B. Servando Kyllonen, PT, DPT  Acute Rehabilitation [phone] pager #[phone] office   08/31/2018, 6:07 PM

## 2018-08-31 NOTE — Social Work (Signed)
CSW continuing to follow for support with disposition when medically appropriate. [facility] [place], when pt medically stable will arrange transport.  [name] GravesIsabel Tyianna [name], MSW, [facility] [place] Clinical Social Work [phone]

## 2018-08-31 NOTE — Progress Notes (Signed)
Pt with large black stool that required disimpaction to be removed.

## 2018-08-31 NOTE — Progress Notes (Signed)
Orthopedic Tech Progress Note Patient Details:  [name] [name] 1965/09/29 [id number]  Ortho Devices Type of Ortho Device: Soft collar, Prafo boot/shoe Ortho Device/Splint Location: replacement Ortho Device/Splint Interventions: Application   Post Interventions Patient Tolerated: Well Instructions Provided: Care of device   Saul FordyceJennifer C Tinika [name] 08/31/2018, 6:46 PM

## 2018-08-31 NOTE — Progress Notes (Signed)
PROGRESS NOTE    [name] [name]  RJJ:[id number] DOB: Feb 09, 1966 DOA: 06/06/2018 PCP: Helane Rima, MD   Brief Narrative:  52 y.o.year old malewith medical history significant for gout, hypertension, underwent anterior cervical corpectomy of C4 on 0/16/0109 with complicated postoperative course including development of multiple PEs and DVTs started on anticoagulation and subsequently developed spinal hematoma requiring reexploration on 03/28/2018 status post placement of IVC filter then discharged to SNF who presented on 9/24/2019as a transfer from [facility] with cervical spine MRI showing changes consistent with cord compression, cord edema and concern for ischemia. His hospital course has been complicated by cervical decompression laminectomy, postoperative hematomas and multiple evacuations, new bilateral lower extremity DVTs, epidural hematoma (32/35, 57/32) complicating anticoagulation therapy (briefly on argatroban and discontinued given recurrent hematomas), new PE now stable on eliquis, bone marrow biopsy for evaluation of thrombocytopenia, platelet and endplate transfusions.Cervical spine 08/19/18 MRI which neurosurgery evaluated and deemed it looked stable and recommended no further work-up. Patient has been stable throughout this week until spike a fever to 102 overnight on 12/15 followed by another fever overnight on 12/16. In both settings patient was noted to be somewhat altered with symptoms of disorientation but no new neurologic deficits. We will work-up is included negative chest x-ray, no acute abnormalities on CT abdomen, no repeat or worsening DVTs on lower extremity venous duplex and no growth on blood cultures x2 obtain on 12/16.   Assessment & Plan:   Principal Problem:   Recurrent pulmonary embolism (HCC) Active Problems:   Myelopathy (HCC)   Pressure injury of skin   Thrombocytopenia (HCC)   Acute blood loss anemia   Benign essential HTN   Cord  compression (HCC)   Fever   Spondylosis, cervical, with myelopathy   Labile blood pressure   Orthostatic hypotension   Tachycardia-bradycardia syndrome (HCC)   Adjustment disorder with mixed anxiety and depressed mood   G6PD deficiency   G6PD deficiency anemia (HCC)   Ascending aortic aneurysm (HCC)   Difficult intravenous access   DVT (deep venous thrombosis) (HCC)   SIRS (systemic inflammatory response syndrome) (HCC)  Bilateral lower extremity ankle and toe pain concerning for acute gout flare - Elevated ESR and CRP.  Solu-Medrol IV started -Patient is adamant about being on colchicine and indomethacin.  We will start this for short-term.  He is aware of the risk of GI bleeding especially during this complicated hospital course.  He states he is very miserable in pain therefore will not accept any other therapy at this time. 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 Stable quadriparesis, right upper extremity stronger than left upper extremity (consistent with prior exams based on chart review by neurosurgery evaluation) status post decompressive surgery complicated by multiple postoperative hematomas and evacuations. 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 Case earlier discussed with neurosurgery who deemed slight improvement from prior films since surgery, no further plans for surgery given neurologically stable and very high risk for further bleeding in the cervical area per NSG. will need aggressive rehabilitation with SNF(previously [facility]), social worker assisting, currently looking into spine rehab center with the [place].  Acute pulmonary embolism and DVT IVC filter July-2019 -Multiple events of postoperative epidural hematoma requiring to hold anticoagulation.   Subsequently had DVT therefore started on bivalirudin 11/13 for submassive PE.  Still had DVTs therefore was switched  to ARC Atrovent 11/29 per hematology. - Currently he is back on Eliquis  Bilateral upper and lower extremity edema - Likely from hypoalbuminemia.  No evidence of CHF.  He needs to be moved around more often and needs to get to a rehab.  Resume home oral Lasix therapy.  Input and output, daily weight  Incidental finding of pneumoperitoneum 11/23 -This is resolved.  Thrombocytopenia -Resolved.  Had bone marrow biopsy requiring initiation of platelet transfusion.  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  Essential hypertension - Continue to monitor on IV hydralazine as needed  Pressure ulcer on right initial tuberosity, stage II -Local wound care  Severe osteoarthritis of the left knee -Pain control  Peripheral neuropathy -Gabapentin 600 mg 3 times daily  DVT prophylaxis: Eliquis full code Code Status: Full code Family Communication: None at bedside Disposition Plan: Maintain inpatient stay for improvement in his gout flare.  If he remains afebrile over next 1-2 days, should consider transitioning him to skilled nursing facility  Consultants:   Oncology  Cardiology  Neurosurgery  Surgery  Subjective: Reports left lower extremity pain in his joints which is typical of his gout flare pain.  Remains afebrile in last 24 hours  Review of Systems Otherwise negative except as per HPI, including: General: Denies fever, chills, night sweats or unintended weight loss. Resp: Denies cough, wheezing, shortness of breath. Cardiac: Denies chest pain, palpitations, orthopnea, paroxysmal nocturnal dyspnea. GI: Denies abdominal pain, nausea, vomiting, diarrhea or constipation GU: Denies dysuria, frequency, hesitancy or incontinence MS: Denies muscle aches Neuro: Denies headache, neurologic deficits (focal weakness, numbness, tingling), abnormal  gait Psych: Denies anxiety, depression, SI/HI/AVH Skin: Denies new rashes or lesions ID: Denies sick contacts, exotic exposures, travel  Objective: Vitals:   08/31/18 0002 08/31/18 0458 08/31/18 0826 08/31/18 1204  BP: 111/77 120/82 136/87 120/88  Pulse: 86 94 90 98  Resp:  17    Temp: 98.5 F (36.9 C) 98.2 F (36.8 C) 97.7 F (36.5 C) 97.6 F (36.4 C)  TempSrc: Oral Oral  Oral  SpO2:   99% 97%  Weight:      Height:        Intake/Output Summary (Last 24 hours) at 08/31/2018 1313 Last data filed at 08/31/2018 0005 Gross per 24 hour  Intake -  Output 450 ml  Net -450 ml   Filed Weights   08/29/18 0309 08/30/18 0400 08/30/18 0449  Weight: 124.4 kg 121.1 kg 121.1 kg    Examination:  General exam: Appears calm and comfortable, morbidly obese, c-collar in place.  Quadriplegia Respiratory system: Clear to auscultation. Respiratory effort normal. Cardiovascular system: S1 & S2 heard, RRR. No JVD, murmurs, rubs, gallops or clicks. No pedal edema. Gastrointestinal system: Abdomen is nondistended, soft and nontender. No organomegaly or masses felt. Normal bowel sounds heard. Central nervous system: Alert and oriented. No focal neurological deficits. Extremities: Quadriplegia Skin: No rashes, lesions or ulcers Psychiatry: Judgement and insight appear normal. Mood & affect appropriate.     Data Reviewed:   CBC: Recent Labs  Lab 08/28/18 0728 08/29/18 0718 08/29/18 1654 08/30/18 0356 08/31/18 0433  WBC 16.0* 16.3* 16.0* 14.8* 15.5*  HGB 11.5* 11.6* 11.4* 11.0* 10.4*  HCT 38.8* 37.9* 36.3* 36.5* 33.2*  MCV 96.5 95.9 94.3 95.3 95.1  PLT 180 150 170 163 546   Basic Metabolic Panel: Recent Labs  Lab 08/28/18 0728 08/29/18 0718 08/29/18 1533 08/30/18 0356 08/31/18 0433  NA 139 141 141 140 138  K 3.5 3.6 4.0 3.8  4.5  CL 95* 98 96* 97* 95*  CO2 '31 27 30 30 28  ' GLUCOSE 128* 124* 119* 114* 175*  BUN 22* 22* 27* 25* 32*  CREATININE 0.72 0.80 0.72 0.81 0.79   CALCIUM 9.5 9.7 9.9 9.8 10.0   GFR: Estimated Creatinine Clearance: 138.9 mL/min (by C-G formula based on SCr of 0.79 mg/dL). Liver Function Tests: No results for input(s): AST, ALT, ALKPHOS, BILITOT, PROT, ALBUMIN in the last 168 hours. No results for input(s): LIPASE, AMYLASE in the last 168 hours. No results for input(s): AMMONIA in the last 168 hours. Coagulation Profile: No results for input(s): INR, PROTIME in the last 168 hours. Cardiac Enzymes: No results for input(s): CKTOTAL, CKMB, CKMBINDEX, TROPONINI in the last 168 hours. BNP (last 3 results) No results for input(s): PROBNP in the last 8760 hours. HbA1C: No results for input(s): HGBA1C in the last 72 hours. CBG: Recent Labs  Lab 08/30/18 1145 08/30/18 1717 08/30/18 2055 08/31/18 0825 08/31/18 1209  GLUCAP 103* 131* 304* 173* 195*   Lipid Profile: No results for input(s): CHOL, HDL, LDLCALC, TRIG, CHOLHDL, LDLDIRECT in the last 72 hours. Thyroid Function Tests: No results for input(s): TSH, T4TOTAL, FREET4, T3FREE, THYROIDAB in the last 72 hours. Anemia Panel: No results for input(s): VITAMINB12, FOLATE, FERRITIN, TIBC, IRON, RETICCTPCT in the last 72 hours. Sepsis Labs: No results for input(s): PROCALCITON, LATICACIDVEN in the last 168 hours.  Recent Results (from the past 240 hour(s))  Culture, blood (Routine X 2) w Reflex to ID Panel     Status: None (Preliminary result)   Collection Time: 08/28/18  7:28 AM  Result Value Ref Range Status   Specimen Description BLOOD LEFT HAND  Final   Special Requests   Final    BOTTLES DRAWN AEROBIC ONLY Blood Culture adequate volume   Culture   Final    NO GROWTH 3 DAYS Performed at [facility], [address] [place], [place] [address]    Report Status PENDING  Incomplete  Culture, blood (Routine X 2) w Reflex to ID Panel     Status: None (Preliminary result)   Collection Time: 08/28/18  7:35 AM  Result Value Ref Range Status   Specimen Description BLOOD  RIGHT HAND  Final   Special Requests   Final    BOTTLES DRAWN AEROBIC AND ANAEROBIC Blood Culture adequate volume   Culture   Final    NO GROWTH 3 DAYS Performed at [facility], [address] [place], [place] [address]    Report Status PENDING  Incomplete         Radiology Studies: Ct Abdomen Pelvis W Contrast  Result Date: 08/29/2018 CLINICAL DATA:  52 y/o  M; intermittent fever. Stable quadriparesis. EXAM: CT ABDOMEN AND PELVIS WITH CONTRAST TECHNIQUE: Multidetector CT imaging of the abdomen and pelvis was performed using the standard protocol following bolus administration of intravenous contrast. CONTRAST:  119m OMNIPAQUE IOHEXOL 300 MG/ML  SOLN COMPARISON:  08/06/2018 CT abdomen and pelvis. FINDINGS: Lower chest: Platelike atelectasis or scarring at the right lung base. Elevated left hemidiaphragm. Hepatobiliary: No focal liver abnormality is seen. No gallstones, gallbladder wall thickening, or biliary dilatation. Pancreas: Unremarkable. No pancreatic ductal dilatation or surrounding inflammatory changes. Spleen: Normal in size without focal abnormality. Adrenals/Urinary Tract: Adrenal glands are unremarkable. Kidneys are normal, without renal calculi, focal lesion, or hydronephrosis. Bladder is unremarkable. Stomach/Bowel: Stomach and splenic flexure of colon are above the field of view. Visible stomach is within normal limits. Appendix appears  normal. No evidence of bowel wall thickening, distention, or inflammatory changes. Vascular/Lymphatic: IVC filter in situ. No significant vascular findings. Reproductive: Prostate is unremarkable. Other: No abdominal wall hernia or abnormality. No abdominopelvic ascites. Musculoskeletal: No fracture is seen. Mild-to-moderate spondylosis of the visible spine greatest at L4-5 where there is loss of intervertebral disc space height and endplate Schmorl's nodes. IMPRESSION: 1. Elevated left hemidiaphragm. Stomach and splenic flexure of colon or  above the field of view. 2. No acute process identified. Unremarkable CT of abdomen and pelvis. Electronically Signed   By: Kristine Garbe M.D.   On: 08/29/2018 21:15   Vas [place] Lower Extremity Venous (dvt)  Result Date: 08/29/2018  Lower Venous Study Indications: Extensive DVT since 06/26/18, unable to anticoagulate at first due to bleed risk. Now on [facility].  Performing Technologist: Landry Mellow RDMS, RVT  Examination Guidelines: A complete evaluation includes B-mode imaging, spectral Doppler, color Doppler, and power Doppler as needed of all accessible portions of each vessel. Bilateral testing is considered an integral part of a complete examination. Limited examinations for reoccurring indications may be performed as noted.  Right Venous Findings: +---------+---------------+---------+-----------+----------+-------+          CompressibilityPhasicitySpontaneityPropertiesSummary +---------+---------------+---------+-----------+----------+-------+ CFV      Partial        Yes      Yes                          +---------+---------------+---------+-----------+----------+-------+ SFJ      Partial                                              +---------+---------------+---------+-----------+----------+-------+ FV Prox  Partial                                              +---------+---------------+---------+-----------+----------+-------+ FV Mid   Partial                                              +---------+---------------+---------+-----------+----------+-------+ FV DistalPartial        Yes      Yes                          +---------+---------------+---------+-----------+----------+-------+ PFV      Partial                                              +---------+---------------+---------+-----------+----------+-------+ POP      Partial        Yes      Yes                          +---------+---------------+---------+-----------+----------+-------+ PTV       Partial                                              +---------+---------------+---------+-----------+----------+-------+  PERO     Partial                                              +---------+---------------+---------+-----------+----------+-------+  Left Venous Findings: +---------+---------------+---------+-----------+----------+-------+          CompressibilityPhasicitySpontaneityPropertiesSummary +---------+---------------+---------+-----------+----------+-------+ CFV      Partial        Yes      Yes                          +---------+---------------+---------+-----------+----------+-------+ SFJ      Partial                                              +---------+---------------+---------+-----------+----------+-------+ FV Prox  Partial                                              +---------+---------------+---------+-----------+----------+-------+ FV Mid   Partial        Yes      Yes                          +---------+---------------+---------+-----------+----------+-------+ FV DistalPartial                                              +---------+---------------+---------+-----------+----------+-------+ PFV      Partial                                              +---------+---------------+---------+-----------+----------+-------+ POP      Partial        Yes      Yes                          +---------+---------------+---------+-----------+----------+-------+ PTV      Partial                                              +---------+---------------+---------+-----------+----------+-------+ PERO     Partial                                              +---------+---------------+---------+-----------+----------+-------+ DVT appears improved BLE since last study 07/30/18   Summary: Right: Findings consistent with age indeterminate deep vein thrombosis involving the right common femoral vein, right femoral vein, right  proximal profunda vein, right popliteal vein, right posterior tibial vein, and right peroneal vein. 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  *See table(s) above for measurements  and observations. Electronically signed by Deitra Mayo MD on 08/29/2018 at 4:34:02 PM.    Final         Scheduled Meds: . sodium chloride   Intravenous Once  . sodium chloride   Intravenous Once  . apixaban  5 mg Oral BID  . baclofen  10 mg Oral BID  . bismuth subsalicylate  30 mL Oral TID AC & HS  . clonazePAM  0.5 mg Oral BID  . colchicine  0.6 mg Oral Q8H   Followed by  . colchicine  0.6 mg Oral BID  . famotidine  20 mg Oral Daily  . feeding supplement (ENSURE ENLIVE)  237 mL Oral TID BM  . furosemide  40 mg Oral BID  . gabapentin  600 mg Oral TID  . indomethacin  25 mg Oral BID WC  . insulin aspart  0-20 Units Subcutaneous TID WC  . insulin aspart  0-5 Units Subcutaneous QHS  . insulin glargine  15 Units Subcutaneous QHS  . magic mouthwash w/lidocaine  5 mL Oral TID  . mouth rinse  15 mL Mouth Rinse BID  . methylPREDNISolone (SOLU-MEDROL) injection  40 mg Intravenous Q12H  . multivitamin with minerals  1 tablet Oral Daily  . omeprazole  20 mg Oral BID AC  . potassium chloride  40 mEq Oral Daily  . sodium chloride flush  10-40 mL Intracatheter Q12H   Continuous Infusions: . sodium chloride 10 mL/hr at 08/15/18 0200     LOS: 85 days   Time spent= 40 mins     Arsenio Loader, MD [facility] Pager [phone]   If 7PM-7AM, please contact night-coverage [email or web address] Password [email or web address] 08/31/2018, 1:13 PM

## 2018-08-31 NOTE — Progress Notes (Signed)
Nutrition Follow-up  DOCUMENTATION CODES:   Morbid obesity  INTERVENTION:  Continue Ensure Enlive po TID, each supplement provides 350 kcal and 20 grams of protein.  Encourage adequate PO intake.   NUTRITION DIAGNOSIS:   Increased nutrient needs related to wound healing, post-op healing as evidenced by estimated needs; ongoing  GOAL:   Patient will meet greater than or equal to 90% of their needs; progressing  MONITOR:   PO intake, Supplement acceptance, Labs, Weight trends, Skin, I & O's  REASON FOR ASSESSMENT:   Low Braden    ASSESSMENT:   [name] [name] is a 52 y.o. male with history of hypertension who has had a recent ACDF at C3 C5 by Dr. Wynetta Emeryram in July 2019 complicated by pulmonary embolism was placed on apixaban subsequently discharged to rehab and patient had moved to [place] area to [place] and has been living with his parents.  Had gone to ER around August 27 for complaints of increasing weakness and at the time was found to be febrile and was admitted for possible pneumonia.  He started antibiotic as per the ER physician with whom I discussed.  Patient was eventually discharged to rehab and patient was discharged from rehab last week.  [facility] for his known DVT and on reaching back home in [place] patient on trying to get out of the car started finding it difficult to walk and his legs gave away.  He also states he inadvertently turned his neck.  Following which patient has been having increasing weakness difficult to walk.  Prior to which patient was able to walk up the stairs.  Denies any incontinence of urine or bowel.  Neck pain has been chronic.  Denies any chest pain or shortness of breath.  Meal completion has been varied from 25-100% most recently. Pt has been tolerating his diet. Ensure has been ordered to aid in caloric and protein needs as well as in wound healing and has been consuming them. Will  continue with current orders. Pt encouraged to eat his food at meals and to drink his supplements.   Labs and medications reviewed.    Diet Order:   Diet Order            Diet regular Room service appropriate? Yes; Fluid consistency: Thin  Diet effective now              EDUCATION NEEDS:   Education needs have been addressed  Skin:  Skin Assessment: Skin Integrity Issues: Skin Integrity Issues:: Stage II, Stage III, Incisions Stage II: R ischial tubersosity, sacrum Stage III: L ischial Incisions: N/A  Last BM:  12/19  Height:   Ht Readings from Last 1 Encounters:  06/22/18 5\' 9"  (1.753 m)    Weight:   Wt Readings from Last 1 Encounters:  08/30/18 121.1 kg    Ideal Body Weight:  72.7 kg  BMI:  Body mass index is 39.43 kg/m.  Estimated Nutritional Needs:   Kcal:  2100-2300  Protein:  145-160 grams  Fluid:  > 2.1 L   Roslyn SmilingStephanie Tenasia Aull, MS, RD, LDN Pager # [phone] After hours/ weekend pager # [phone]

## 2018-09-01 LAB — BASIC METABOLIC PANEL
Anion gap: 14 (ref 5–15)
BUN: 47 mg/dL — AB (ref 6–20)
CHLORIDE: 97 mmol/L — AB (ref 98–111)
CO2: 29 mmol/L (ref 22–32)
Calcium: 10.3 mg/dL (ref 8.9–10.3)
Creatinine, Ser: 0.85 mg/dL (ref 0.61–1.24)
GFR calc Af Amer: >60 mL/min (ref >60)
GFR calc non Af Amer: >60 mL/min (ref >60)
Glucose, Bld: 162 mg/dL — ABNORMAL HIGH (ref 70–99)
Potassium: 5 mmol/L (ref 3.5–5.1)
Sodium: 140 mmol/L (ref 135–145)

## 2018-09-01 LAB — CBC
HCT: 32.5 % — ABNORMAL LOW (ref 39.0–52.0)
Hemoglobin: 10.1 g/dL — ABNORMAL LOW (ref 13.0–17.0)
MCH: 29.3 pg (ref 26.0–34.0)
MCHC: 31.1 g/dL (ref 30.0–36.0)
MCV: 94.2 fL (ref 80.0–100.0)
Platelets: 151 10*3/uL (ref 150–400)
RBC: 3.45 MIL/uL — ABNORMAL LOW (ref 4.22–5.81)
RDW: 16.5 % — ABNORMAL HIGH (ref 11.5–15.5)
WBC: 21.3 10*3/uL — ABNORMAL HIGH (ref 4.0–10.5)
nRBC: 0.1 % (ref 0.0–0.2)

## 2018-09-01 LAB — GLUCOSE, CAPILLARY
Glucose-Capillary: 149 mg/dL — ABNORMAL HIGH (ref 70–99)
Glucose-Capillary: 160 mg/dL — ABNORMAL HIGH (ref 70–99)
Glucose-Capillary: 130 mg/dL — ABNORMAL HIGH (ref 70–99)
Glucose-Capillary: 217 mg/dL — ABNORMAL HIGH (ref 70–99)

## 2018-09-01 NOTE — Social Work (Signed)
[facility] [place]. Pt still approved to discharge to [facility] there when medically stable for transport.  Updated PT/OT and MD note sent to fax at [phone]. When he is medically stable CSW will call [phone] and [facility] pt.   This information will be left in CSW handoff for oncoming CSW.  Octavio GravesIsabel Bennette Hasty, MSW, [facility] [place] Clinical Social Work [phone]

## 2018-09-01 NOTE — Progress Notes (Signed)
PROGRESS NOTE  [name] [name] CNO:[id number] DOB: May 16, 1966 DOA: 06/06/2018 PCP: Helane Rima, MD   LOS: 86 days   Brief Narrative / Interim history: 52 y.o.year old malewith medical history significant for gout, hypertension, underwent anterior cervical corpectomy of C4 on 2/94/7654 with complicated postoperative course including development of multiple PEs and DVTs started on anticoagulation and subsequently developed spinal hematoma requiring reexploration on 03/28/2018 status post placement of IVC filter then discharged to SNF who presented on 9/24/2019as a transfer from [facility] with cervical spine MRI showing changes consistent with cord compression, cord edema and concern for ischemia. His hospital course has been complicated by cervical decompression laminectomy, postoperative hematomas and multiple evacuations, new bilateral lower extremity DVTs, epidural hematoma (65/03, 54/65) complicating anticoagulation therapy (briefly on argatroban and discontinued given recurrent hematomas), new PE now stable on eliquis, bone marrow biopsy for evaluation of thrombocytopenia, platelet and endplate transfusions.Cervical spine 08/19/18 MRI which neurosurgery evaluated and deemed it looked stable and recommended no further work-up. Patient has been stable throughout this week until spike a fever to 102 overnight on 12/15 followed by another fever overnight on 12/16. In both settings patient was noted to be somewhat altered with symptoms of disorientation but no new neurologic deficits. We will work-up is included negative chest x-ray, no acute abnormalities on CT abdomen, no repeat or worsening DVTs on lower extremity venous duplex and no growth on blood cultures x2 obtain on 12/16.  Subjective: -Doing well this morning, less pain in his left ankle, denies any shortness of breath, denies any chest pain, no abdominal pain nausea or vomiting.  Assessment & Plan: Principal Problem:  Recurrent pulmonary embolism (HCC) Active Problems:   Myelopathy (HCC)   Pressure injury of skin   Thrombocytopenia (HCC)   Acute blood loss anemia   Benign essential HTN   Cord compression (HCC)   Fever   Spondylosis, cervical, with myelopathy   Labile blood pressure   Orthostatic hypotension   Tachycardia-bradycardia syndrome (HCC)   Adjustment disorder with mixed anxiety and depressed mood   G6PD deficiency   G6PD deficiency anemia (HCC)   Ascending aortic aneurysm (HCC)   Difficult intravenous access   DVT (deep venous thrombosis) (HCC)   SIRS (systemic inflammatory response syndrome) (HCC)   Bilateral lower extremity ankle and toe pain concerning for acute gout flare -Elevated ESR and CRP.  Solu-Medrol IV started, continue IV for an additional days, patient insistent that we do not transition him to prednisone too soon -Patient is adamant about being on colchicine and indomethacin.  We will start this for short-term.  He is aware of the risk of GI bleeding especially during this complicated hospital course.  He states he is very miserable in pain therefore will not accept any other therapy at this time. 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 Stable quadriparesis, right upper extremity stronger than left upper extremity (consistent with prior exams based on chart review by neurosurgery evaluation) status post decompressive surgery complicated by multiple postoperative hematomas and evacuations. 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 Caseearlierdiscussed with neurosurgery who deemed slight improvement from prior films since surgery, no further plans for surgery given neurologically stable and very high  risk for further bleeding in the cervical area per NSG. will need aggressive rehabilitation with  SNF(previously considered for [facility]), social worker assisting, currently looking into spine rehab center with the [place].  Fevers -Most recent fever few days ago, afebrile since.  Possibly due to gout, no other clinical evidence of infection.  He does have a leukocytosis which is new today but this may be related to the steroids.  We will culture if he continues to spike another temperature  Acute pulmonary embolism and DVT IVC filter July-2019 -Multiple events of postoperative epidural hematoma requiring to hold anticoagulation.  Subsequently had DVT therefore started on bivalirudin 11/13 for submassive PE.  Still had DVTs therefore was switched to ARC Atrovent 11/29 per hematology. - Currently he is back on Eliquis  Bilateral upper and lower extremity edema - Likely from hypoalbuminemia.  No evidence of CHF.  He needs to be moved around more often and needs to get to a rehab.  Resume home oral Lasix therapy.  Input and output, daily weight  Incidental finding of pneumoperitoneum 11/23 -This is resolved.  Thrombocytopenia -Resolved.  Had bone marrow biopsy requiring initiation of platelet transfusion.  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  Essential hypertension - Continue to monitor on IV hydralazine as needed  Pressure ulcer on right initial tuberosity, stage II -Local wound care  Severe osteoarthritis of the left knee -Pain control  Peripheral neuropathy -Gabapentin 600 mg 3 times daily   Scheduled Meds: . sodium chloride   Intravenous Once  . sodium chloride   Intravenous Once  . apixaban  5 mg Oral BID  . baclofen  10 mg Oral BID  . bismuth subsalicylate  30 mL Oral TID AC & HS  . clonazePAM  0.5 mg Oral BID  . colchicine  0.6 mg Oral BID  . famotidine  20 mg Oral Daily  . feeding supplement (ENSURE ENLIVE)  237 mL Oral TID BM  .  furosemide  40 mg Oral BID  . gabapentin  600 mg Oral TID  . indomethacin  25 mg Oral BID WC  . insulin aspart  0-20 Units Subcutaneous TID WC  . insulin aspart  0-5 Units Subcutaneous QHS  . insulin glargine  15 Units Subcutaneous QHS  . magic mouthwash w/lidocaine  5 mL Oral TID  . mouth rinse  15 mL Mouth Rinse BID  . methylPREDNISolone (SOLU-MEDROL) injection  40 mg Intravenous Q12H  . multivitamin with minerals  1 tablet Oral Daily  . omeprazole  20 mg Oral BID AC  . potassium chloride  40 mEq Oral Daily  . sodium chloride flush  10-40 mL Intracatheter Q12H   Continuous Infusions: . sodium chloride 10 mL/hr at 08/15/18 0200   PRN Meds:.sodium chloride, acetaminophen **OR** acetaminophen, docusate sodium, hydrALAZINE, HYDROcodone-homatropine, HYDROmorphone (DILAUDID) injection, loperamide, ondansetron **OR** ondansetron (ZOFRAN) IV, oxyCODONE, sodium chloride flush, sodium chloride flush  DVT prophylaxis: Eliquis Code Status: Full code Family Communication: no family at bedside  Disposition Plan: SNF 1-2 days once on po agents and clinically stable  Consultants:   Oncology  Cardiology  Neurosurgery  Surgery  Procedures:  On 10/2 1. posterior cervical decompressive laminectomy at C3, C4, C5 with removal of the spinous process and complete laminectomy and foraminotomies of the C3, C4, C5 nerve roots. 2. Posterior cervical fusion with lateral mass screws at C3-C4-C5 and pars screws at C2 lies in the globus ellipseLateral mass screw system 3. Posterior lateral arthrodesis C2-C5 utilizing locally harvested autograft mixed with vivigen  06/22/2018:(Preceded by symptoms of worsening left-sided paresthesia weakness, MRI noted postoperative hematoma)  Procedure reexploration posterior cervical wound for evacuation of epidural hematoma  06/26/2018 DVT ultrasound:Right: Findings consistent with acute deep vein thrombosis involving the right common femoral vein, right femoral  vein, right popliteal vein, right posterior tibial vein, and right peroneal vein. Thrombosis extends proximally into external iliac vein.  Proximal iliac vein not visualized. Left: Findings consistent with acute deep vein thrombosis involving the left common femoral vein, left femoral vein, left popliteal vein, left posterior tibial vein, and left peroneal vein. Findings consistent with acute superficial vein thrombosis  involving the left great saphenous vein. Thrombosis extends proximally into external iliac vein. Proximal iliac vein not visualized.  07/01/18 (symptoms of decreased left hand grip, MRI showed large epidural hematoma) requiring operative clot evacuation   07/02/18 RUE doppler, no DVT in right upper extremity  10/28/2019Bilateral lower extremity Doppler, acute DVT in right, acute DVTin left common femoral vein, left proximal profunda vein, left femoral vein, left popliteal vein, left posterior tibial vein, and left peroneal vein  TTE, 07/27/2018, EF 40-34%, grade 1 diastolic dysfunction  74/25/9563 bilateral lower extremity Doppler, DVT in bilateral saphenofemoral junctions, common femoral, femoral, popliteal, posterior tibial, and peroneal veins  08/10/2018 Left upper extremity duplex, acute DVTin left subclavian veins, left axillary vein, left brachial vein and left radial veins. Acute superficial vein thrombosis involving the left basilar vein and left cephalic vein.  08/29/2018 bilateral lower extremity duplexAge indeterminate DVTin the right common femoral vein, right femoral vein, right proximal profunda vein, right popliteal vein, right posterior tibial vein, and right peroneal vein. And age-indeterminate left DVT involving the left common femoral vein, left femoral vein, left proximal profunda vein, left popliteal vein, left posterior tibial vein, and left peroneal vein   Antimicrobials:  None currently    Objective: Vitals:   08/31/18 2300 08/31/18  2324 09/01/18 0500 09/01/18 0835  BP: 140/88 140/88 (!) 150/93 (!) 152/97  Pulse:  74  79  Resp:    20  Temp: 98 F (36.7 C)  97.8 F (36.6 C) 97.9 F (36.6 C)  TempSrc: Oral  Oral Oral  SpO2: 100% 100%  100%  Weight:   123 kg   Height:        Intake/Output Summary (Last 24 hours) at 09/01/2018 1012 Last data filed at 08/31/2018 2000 Gross per 24 hour  Intake 240 ml  Output 625 ml  Net -385 ml   Filed Weights   08/30/18 0400 08/30/18 0449 09/01/18 0500  Weight: 121.1 kg 121.1 kg 123 kg    Examination:  Constitutional: NAD Eyes:  lids and conjunctivae normal ENMT: Mucous membranes are moist.  Neck: normal, supple Respiratory: clear to auscultation bilaterally, no wheezing, no crackles. Normal respiratory effort. Cardiovascular: Regular rate and rhythm, no murmurs / rubs / gallops.  1+ LE edema. Abdomen: no tenderness. Bowel sounds positive.  Musculoskeletal: no clubbing / cyanosis.  Skin: no rashe Neurologic: Moves well his right upper extremity > left upper extremity, cannot move legs Psychiatric: Normal judgment and insight. Alert and oriented x 3. Normal mood.    Data Reviewed: I have independently reviewed following labs and imaging studies   CBC: Recent Labs  Lab 08/29/18 0718 08/29/18 1654 08/30/18 0356 08/31/18 0433 09/01/18 0447  WBC 16.3* 16.0* 14.8* 15.5* 21.3*  HGB 11.6* 11.4* 11.0* 10.4* 10.1*  HCT 37.9* 36.3* 36.5* 33.2* 32.5*  MCV 95.9 94.3 95.3 95.1 94.2  PLT 150 170 163 174 875   Basic Metabolic Panel: Recent Labs  Lab 08/29/18 0718 08/29/18 1533 08/30/18 0356 08/31/18  6834 09/01/18 0447  NA 141 141 140 138 140  K 3.6 4.0 3.8 4.5 5.0  CL 98 96* 97* 95* 97*  CO2 '27 30 30 28 29  ' GLUCOSE 124* 119* 114* 175* 162*  BUN 22* 27* 25* 32* 47*  CREATININE 0.80 0.72 0.81 0.79 0.85  CALCIUM 9.7 9.9 9.8 10.0 10.3   GFR: Estimated Creatinine Clearance: 131.7 mL/min (by C-G formula based on SCr of 0.85 mg/dL). Liver Function Tests: No  results for input(s): AST, ALT, ALKPHOS, BILITOT, PROT, ALBUMIN in the last 168 hours. No results for input(s): LIPASE, AMYLASE in the last 168 hours. No results for input(s): AMMONIA in the last 168 hours. Coagulation Profile: No results for input(s): INR, PROTIME in the last 168 hours. Cardiac Enzymes: No results for input(s): CKTOTAL, CKMB, CKMBINDEX, TROPONINI in the last 168 hours. BNP (last 3 results) No results for input(s): PROBNP in the last 8760 hours. HbA1C: No results for input(s): HGBA1C in the last 72 hours. CBG: Recent Labs  Lab 08/31/18 0825 08/31/18 1209 08/31/18 1720 08/31/18 2201 09/01/18 0836  GLUCAP 173* 195* 179* 167* 149*   Lipid Profile: No results for input(s): CHOL, HDL, LDLCALC, TRIG, CHOLHDL, LDLDIRECT in the last 72 hours. Thyroid Function Tests: No results for input(s): TSH, T4TOTAL, FREET4, T3FREE, THYROIDAB in the last 72 hours. Anemia Panel: No results for input(s): VITAMINB12, FOLATE, FERRITIN, TIBC, IRON, RETICCTPCT in the last 72 hours. Urine analysis:    Component Value Date/Time   COLORURINE AMBER (A) 06/07/2018 0900   APPEARANCEUR CLOUDY (A) 06/07/2018 0900   LABSPEC 1.028 06/07/2018 0900   PHURINE 5.0 06/07/2018 0900   GLUCOSEU NEGATIVE 06/07/2018 0900   HGBUR SMALL (A) 06/07/2018 0900   BILIRUBINUR NEGATIVE 06/07/2018 0900   KETONESUR 5 (A) 06/07/2018 0900   PROTEINUR 30 (A) 06/07/2018 0900   NITRITE NEGATIVE 06/07/2018 0900   LEUKOCYTESUR NEGATIVE 06/07/2018 0900   Sepsis Labs: Invalid input(s): PROCALCITONIN, LACTICIDVEN  Recent Results (from the past 240 hour(s))  Culture, blood (Routine X 2) w Reflex to ID Panel     Status: None (Preliminary result)   Collection Time: 08/28/18  7:28 AM  Result Value Ref Range Status   Specimen Description BLOOD LEFT HAND  Final   Special Requests   Final    BOTTLES DRAWN AEROBIC ONLY Blood Culture adequate volume   Culture   Final    NO GROWTH 3 DAYS Performed at [facility], [address] [place], [place] [address]    Report Status PENDING  Incomplete  Culture, blood (Routine X 2) w Reflex to ID Panel     Status: None (Preliminary result)   Collection Time: 08/28/18  7:35 AM  Result Value Ref Range Status   Specimen Description BLOOD RIGHT HAND  Final   Special Requests   Final    BOTTLES DRAWN AEROBIC AND ANAEROBIC Blood Culture adequate volume   Culture   Final    NO GROWTH 3 DAYS Performed at [facility], [place] [address], [place], [place] [address]    Report Status PENDING  Incomplete      Radiology Studies: No results found.    Marzetta Board, MD, PhD [facility] Pager [phone]  If 7PM-7AM, please contact night-coverage [email or web address] Password [email or web address] 09/01/2018, 10:12 AM

## 2018-09-02 LAB — CBC
HEMATOCRIT: 34.3 % — AB (ref 39.0–52.0)
Hemoglobin: 10.9 g/dL — ABNORMAL LOW (ref 13.0–17.0)
MCH: 30.2 pg (ref 26.0–34.0)
MCHC: 31.8 g/dL (ref 30.0–36.0)
MCV: 95 fL (ref 80.0–100.0)
Platelets: 185 10*3/uL (ref 150–400)
RBC: 3.61 MIL/uL — ABNORMAL LOW (ref 4.22–5.81)
RDW: 16.3 % — ABNORMAL HIGH (ref 11.5–15.5)
WBC: 22.3 10*3/uL — ABNORMAL HIGH (ref 4.0–10.5)
nRBC: 0.1 % (ref 0.0–0.2)

## 2018-09-02 LAB — GLUCOSE, CAPILLARY
Glucose-Capillary: 214 mg/dL — ABNORMAL HIGH (ref 70–99)
Glucose-Capillary: 192 mg/dL — ABNORMAL HIGH (ref 70–99)
Glucose-Capillary: 149 mg/dL — ABNORMAL HIGH (ref 70–99)
Glucose-Capillary: 140 mg/dL — ABNORMAL HIGH (ref 70–99)
Glucose-Capillary: 155 mg/dL — ABNORMAL HIGH (ref 70–99)

## 2018-09-02 LAB — CULTURE, BLOOD (ROUTINE X 2)
CULTURE: NO GROWTH
Culture: NO GROWTH
SPECIAL REQUESTS: ADEQUATE
Special Requests: ADEQUATE

## 2018-09-02 LAB — BASIC METABOLIC PANEL
Anion gap: 16 — ABNORMAL HIGH (ref 5–15)
BUN: 59 mg/dL — ABNORMAL HIGH (ref 6–20)
CO2: 28 mmol/L (ref 22–32)
Calcium: 9.9 mg/dL (ref 8.9–10.3)
Chloride: 96 mmol/L — ABNORMAL LOW (ref 98–111)
Creatinine, Ser: 1 mg/dL (ref 0.61–1.24)
GFR calc Af Amer: >60 mL/min (ref >60)
GFR calc non Af Amer: >60 mL/min (ref >60)
GLUCOSE: 192 mg/dL — AB (ref 70–99)
Potassium: 4.5 mmol/L (ref 3.5–5.1)
Sodium: 140 mmol/L (ref 135–145)

## 2018-09-02 MED ORDER — ALLOPURINOL 100 MG PO TABS
100.0000 mg | ORAL_TABLET | Freq: Every day | ORAL | Status: DC
  Administered 2018-09-02 – 2018-09-10 (×9): 100 mg via ORAL
  Filled 2018-09-02 (×9): qty 1

## 2018-09-02 MED ORDER — PREDNISONE 50 MG PO TABS
50.0000 mg | ORAL_TABLET | Freq: Every day | ORAL | Status: DC
  Administered 2018-09-03 – 2018-09-06 (×4): 50 mg via ORAL
  Filled 2018-09-02 (×4): qty 1

## 2018-09-02 NOTE — Progress Notes (Signed)
PROGRESS NOTE    [name] [name]  NAT:[id number] DOB: [birth year] DOA: 06/06/2018 PCP: Helane Rima, MD     Brief Narrative:  [name] [name] is a 52 y.o.year old malewith medical history significant for gout, hypertension, underwent anterior cervical corpectomy of C4 on 01/07/622 with complicated postoperative course including development of multiple PEs and DVTs started on anticoagulation and subsequently developed spinal hematoma requiring reexploration on 03/28/2018 status post placement of IVC filter then discharged to SNF who presented on 9/24/2019as a transfer from [facility] with cervical spine MRI showing changes consistent with cord compression, cord edema and concern for ischemia. His hospital course has been complicated by cervical decompression laminectomy, postoperative hematomas and multiple evacuations, new bilateral lower extremity DVTs, epidural hematoma (76/28, 31/51) complicating anticoagulation therapy (briefly on argatroban and discontinued given recurrent hematomas), new PE now stable on eliquis, bone marrow biopsy for evaluation of thrombocytopenia, platelet and endplate transfusions.Cervical spine 08/19/18 MRI which neurosurgery evaluated and deemed it looked stable and recommended no further work-up. Patient has been stable throughout this week until spike a fever to 102 overnight on 12/15 followed by another fever overnight on 12/16. In both settings patient was noted to be somewhat altered with symptoms of disorientation but no new neurologic deficits. We will work-up is included negative chest x-ray, no acute abnormalities on CT abdomen, no repeat or worsening DVTs on lower extremity venous duplex and no growth on blood cultures x2 obtain on 12/16.  New events last 24 hours / Subjective: Afebrile. States he continues to have gout pain in his left great toe.  Otherwise no complaints.  Assessment & Plan:   Principal Problem:   Recurrent pulmonary embolism  (HCC) Active Problems:   Myelopathy (HCC)   Pressure injury of skin   Thrombocytopenia (HCC)   Acute blood loss anemia   Benign essential HTN   Cord compression (HCC)   Fever   Spondylosis, cervical, with myelopathy   Labile blood pressure   Orthostatic hypotension   Tachycardia-bradycardia syndrome (HCC)   Adjustment disorder with mixed anxiety and depressed mood   G6PD deficiency   G6PD deficiency anemia (HCC)   Ascending aortic aneurysm (HCC)   Difficult intravenous access   DVT (deep venous thrombosis) (HCC)   SIRS (systemic inflammatory response syndrome) (HCC)   Bilateral lower extremity ankle and toe pain concerning for acute gout flare -Elevated ESR and CRP. Solu-Medrol IV started, transition to prednisone in AM  -Continue allopurinol, colchicine, indomethacin   Severe cervical spine stenosis -Stable quadriparesis, right upper extremity stronger than left upper extremity (consistent with prior exams based on chart review by neurosurgery evaluation) status post decompressive surgery complicated by multiple postoperative hematomas and evacuations. Most recent MRI of cervical spine on 12/7 reports chronic sequelae of compressive myelopathy.Caseearlierdiscussed with neurosurgery who deemed slight improvement from prior films since surgery, no further plans for surgery given neurologically stable and very high risk for further bleeding in the cervical area per NSG. Will need aggressive rehabilitation with spinal rehab   Fevers -Most recent fever few days ago, afebrile since.  Possibly due to gout, no other clinical evidence of infection.  He does have a leukocytosis which is new but this may be related to the steroids. No further fevers noted   Acute pulmonary embolism and DVT -S/p IVC filter July-2019 -Multiple events of postoperative epidural hematoma requiring to hold anticoagulation. Subsequently had DVT therefore started on bivalirudin 11/13 for submassive PE. Still  had DVTs therefore was switched to ARC Atrovent 11/29 per hematology. -  Currently he is back on Eliquis  Bilateral upper and lower extremity edema -Likely from hypoalbuminemia. No evidence of CHF. He needs to be moved around more often and needs to get to a rehab. Resume home oral Lasix therapy. Input and output, daily weight  Incidental finding of pneumoperitoneum 11/23 -This is resolved.  Thrombocytopenia -Resolved. Had bone marrow biopsy requiring initiation of platelet transfusion. 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  Essential hypertension -Continue to monitor on IV hydralazine as needed  Pressure ulcer on right initial tuberosity, stage II -Local wound care  Severe osteoarthritis of the left knee -Pain control  Peripheral neuropathy -Gabapentin 600 mg 3 times daily  DVT prophylaxis: Eliquis Code Status: Full Family Communication: No family at bedside Disposition Plan: Pending discharge to [facility] Rehab in [place], hopefully 12/22 if no acute events   Antimicrobials:  Anti-infectives (From admission, onward)   Start     Dose/Rate Route Frequency Ordered Stop   08/13/18 1000  ceFEPIme (MAXIPIME) 1 g in sodium chloride 0.9 % 100 mL IVPB     1 g 200 mL/hr over 30 Minutes Intravenous Every 8 hours 08/13/18 0949 08/14/18 2259   08/05/18 1415  vancomycin (VANCOCIN) IVPB 1000 mg/200 mL premix  Status:  Discontinued     1,000 mg 200 mL/hr over 60 Minutes Intravenous Every 8 hours 08/05/18 1410 08/08/18 1257   08/05/18 1415  ceFEPIme (MAXIPIME) 1 g in sodium chloride 0.9 % 100 mL IVPB     1 g 200 mL/hr over 30 Minutes Intravenous Every 8 hours 08/05/18 1410 08/12/18 0544   08/04/18 0645  levofloxacin (LEVAQUIN) IVPB 500 mg  Status:  Discontinued     500 mg 100 mL/hr over 60 Minutes Intravenous Every 24 hours 08/04/18 0642 08/05/18 1316   07/25/18 1700  fluconazole (DIFLUCAN) IVPB 400 mg  Status:  Discontinued     400 mg 100 mL/hr over 120 Minutes  Intravenous Every 24 hours 07/25/18 1633 07/27/18 1415   07/12/18 2100  ceFAZolin (ANCEF) IVPB 2g/100 mL premix  Status:  Discontinued     2 g 200 mL/hr over 30 Minutes Intravenous Every 8 hours 07/12/18 1555 07/15/18 1050   07/10/18 2229  bacitracin 50,000 Units in sodium chloride 0.9 % 500 mL irrigation  Status:  Discontinued       As needed 07/10/18 2230 07/10/18 2300   07/07/18 1133  bacitracin 50,000 Units in sodium chloride 0.9 % 500 mL irrigation  Status:  Discontinued       As needed 07/07/18 1133 07/07/18 1357   07/06/18 0900  ceFAZolin (ANCEF) IVPB 2g/100 mL premix  Status:  Discontinued     2 g 200 mL/hr over 30 Minutes Intravenous Every 8 hours 07/06/18 0810 07/12/18 1555   07/01/18 2240  bacitracin 50,000 Units in sodium chloride 0.9 % 500 mL irrigation  Status:  Discontinued       As needed 07/01/18 2240 07/01/18 2324   06/30/18 1330  doxycycline (VIBRA-TABS) tablet 100 mg  Status:  Discontinued     100 mg Oral Every 12 hours 06/30/18 1329 07/12/18 1429   06/27/18 1000  fluconazole (DIFLUCAN) tablet 200 mg  Status:  Discontinued     200 mg Oral Daily 06/27/18 0725 07/04/18 0720   06/22/18 2315  ceFAZolin (ANCEF) IVPB 2g/100 mL premix     2 g 200 mL/hr over 30 Minutes Intravenous Every 8 hours 06/22/18 2314 06/27/18 1519   06/22/18 1828  bacitracin 50,000 Units in sodium chloride 0.9 % 500  mL irrigation  Status:  Discontinued       As needed 06/22/18 1828 06/22/18 1915   06/22/18 1700  ceFAZolin (ANCEF) 3 g in dextrose 5 % 50 mL IVPB  Status:  Discontinued     3 g 100 mL/hr over 30 Minutes Intravenous  Once 06/22/18 1647 06/22/18 2101   06/14/18 2100  ceFAZolin (ANCEF) IVPB 2g/100 mL premix     2 g 200 mL/hr over 30 Minutes Intravenous Every 8 hours 06/14/18 1712 06/17/18 0800   06/14/18 1414  bacitracin 50,000 Units in sodium chloride 0.9 % 500 mL irrigation  Status:  Discontinued       As needed 06/14/18 1414 06/14/18 1647   06/14/18 1215  ceFAZolin (ANCEF) 3 g in  dextrose 5 % 50 mL IVPB     3 g 160 mL/hr over 30 Minutes Intravenous To [facility] 06/14/18 1202 06/14/18 2225   06/14/18 1200  ceFAZolin (ANCEF) IVPB 2g/100 mL premix  Status:  Discontinued     2 g 200 mL/hr over 30 Minutes Intravenous On call to O.R. 06/14/18 1157 06/14/18 2140       Objective: Vitals:   09/02/18 0315 09/02/18 0600 09/02/18 0909 09/02/18 1138  BP: 116/81  104/65 119/77  Pulse:   76 72  Resp:      Temp: 97.7 F (36.5 C)  97.9 F (36.6 C) 97.7 F (36.5 C)  TempSrc: Oral     SpO2: 96%  97% 99%  Weight:  124.4 kg    Height:        Intake/Output Summary (Last 24 hours) at 09/02/2018 1317 Last data filed at 09/02/2018 1140 Gross per 24 hour  Intake -  Output 1700 ml  Net -1700 ml   Filed Weights   08/30/18 0449 09/01/18 0500 09/02/18 0600  Weight: 121.1 kg 123 kg 124.4 kg    Examination:  General exam: Appears calm and comfortable  Respiratory system: Clear to auscultation. Respiratory effort normal. Cardiovascular system: S1 & S2 heard, RRR. No JVD, murmurs, rubs, gallops or clicks.  Gastrointestinal system: Abdomen is nondistended, soft and nontender. No organomegaly or masses felt. Normal bowel sounds heard. Central nervous system: Alert and oriented. Extremities: Symmetric 5 x 5 power. Skin: No rashes, lesions or ulcers, no redness or erythema of left great toe, TTP  Psychiatry: Judgement and insight appear normal. Mood & affect appropriate.   Data Reviewed: I have personally reviewed following labs and imaging studies  CBC: Recent Labs  Lab 08/29/18 1654 08/30/18 0356 08/31/18 0433 09/01/18 0447 09/02/18 0339  WBC 16.0* 14.8* 15.5* 21.3* 22.3*  HGB 11.4* 11.0* 10.4* 10.1* 10.9*  HCT 36.3* 36.5* 33.2* 32.5* 34.3*  MCV 94.3 95.3 95.1 94.2 95.0  PLT 170 163 174 151 628   Basic Metabolic Panel: Recent Labs  Lab 08/29/18 1533 08/30/18 0356 08/31/18 0433 09/01/18 0447 09/02/18 0339  NA 141 140 138 140 140  K 4.0 3.8 4.5 5.0  4.5  CL 96* 97* 95* 97* 96*  CO2 _0 GLUCOSE 119* 114* 175* 162* 192*  BUN 27* 25* 32* 47* 59*  CREATININE 0.72 0.81 0.79 0.85 1.00  CALCIUM 9.9 9.8 10.0 10.3 9.9   GFR: Estimated Creatinine Clearance: 112.7 mL/min (by C-G formula based on SCr of 1 mg/dL). Liver Function Tests: No results for input(s): AST, ALT, ALKPHOS, BILITOT, PROT, ALBUMIN in the last 168 hours. No results for input(s): LIPASE, AMYLASE in the last 168 hours. No results for input(s): AMMONIA in the  last 168 hours. Coagulation Profile: No results for input(s): INR, PROTIME in the last 168 hours. Cardiac Enzymes: No results for input(s): CKTOTAL, CKMB, CKMBINDEX, TROPONINI in the last 168 hours. BNP (last 3 results) No results for input(s): PROBNP in the last 8760 hours. HbA1C: No results for input(s): HGBA1C in the last 72 hours. CBG: Recent Labs  Lab 09/01/18 1144 09/01/18 1708 09/01/18 2246 09/02/18 0911 09/02/18 1138  GLUCAP 160* 130* 217* 214* 192*   Lipid Profile: No results for input(s): CHOL, HDL, LDLCALC, TRIG, CHOLHDL, LDLDIRECT in the last 72 hours. Thyroid Function Tests: No results for input(s): TSH, T4TOTAL, FREET4, T3FREE, THYROIDAB in the last 72 hours. Anemia Panel: No results for input(s): VITAMINB12, FOLATE, FERRITIN, TIBC, IRON, RETICCTPCT in the last 72 hours. Sepsis Labs: No results for input(s): PROCALCITON, LATICACIDVEN in the last 168 hours.  Recent Results (from the past 240 hour(s))  Culture, blood (Routine X 2) w Reflex to ID Panel     Status: None   Collection Time: 08/28/18  7:28 AM  Result Value Ref Range Status   Specimen Description BLOOD LEFT HAND  Final   Special Requests   Final    BOTTLES DRAWN AEROBIC ONLY Blood Culture adequate volume   Culture   Final    NO GROWTH 5 DAYS Performed at [facility], [address], [place], [place] [address]    Report Status 09/02/2018 FINAL  Final  Culture, blood (Routine X 2) w Reflex to ID Panel      Status: None   Collection Time: 08/28/18  7:35 AM  Result Value Ref Range Status   Specimen Description BLOOD RIGHT HAND  Final   Special Requests   Final    BOTTLES DRAWN AEROBIC AND ANAEROBIC Blood Culture adequate volume   Culture   Final    NO GROWTH 5 DAYS Performed at [facility], [place] [address], [place],  [address]    Report Status 09/02/2018 FINAL  Final       Radiology Studies: No results found.    Scheduled Meds: . allopurinol  100 mg Oral Daily  . apixaban  5 mg Oral BID  . baclofen  10 mg Oral BID  . bismuth subsalicylate  30 mL Oral TID AC & HS  . clonazePAM  0.5 mg Oral BID  . colchicine  0.6 mg Oral BID  . famotidine  20 mg Oral Daily  . feeding supplement (ENSURE ENLIVE)  237 mL Oral TID BM  . furosemide  40 mg Oral BID  . gabapentin  600 mg Oral TID  . indomethacin  25 mg Oral BID WC  . insulin aspart  0-20 Units Subcutaneous TID WC  . insulin aspart  0-5 Units Subcutaneous QHS  . insulin glargine  15 Units Subcutaneous QHS  . magic mouthwash w/lidocaine  5 mL Oral TID  . mouth rinse  15 mL Mouth Rinse BID  . multivitamin with minerals  1 tablet Oral Daily  . omeprazole  20 mg Oral BID AC  . potassium chloride  40 mEq Oral Daily  . [START ON 09/03/2018] predniSONE  50 mg Oral Q breakfast  . sodium chloride flush  10-40 mL Intracatheter Q12H   Continuous Infusions: . sodium chloride 10 mL/hr at 08/15/18 0200     LOS: 87 days    Time spent: 40 minutes   Dessa Phi, DO [facility] [email or web address] Password [email or web address] 09/02/2018, 1:17 PM

## 2018-09-03 LAB — GLUCOSE, CAPILLARY
GLUCOSE-CAPILLARY: 90 mg/dL (ref 70–99)
Glucose-Capillary: 83 mg/dL (ref 70–99)
Glucose-Capillary: 155 mg/dL — ABNORMAL HIGH (ref 70–99)

## 2018-09-03 LAB — CBC
HEMATOCRIT: 34.7 % — AB (ref 39.0–52.0)
Hemoglobin: 10.7 g/dL — ABNORMAL LOW (ref 13.0–17.0)
MCH: 29.2 pg (ref 26.0–34.0)
MCHC: 30.8 g/dL (ref 30.0–36.0)
MCV: 94.6 fL (ref 80.0–100.0)
Platelets: 161 10*3/uL (ref 150–400)
RBC: 3.67 MIL/uL — ABNORMAL LOW (ref 4.22–5.81)
RDW: 16.5 % — ABNORMAL HIGH (ref 11.5–15.5)
WBC: 16.2 10*3/uL — AB (ref 4.0–10.5)
nRBC: 0.2 % (ref 0.0–0.2)

## 2018-09-03 NOTE — Progress Notes (Addendum)
12:00PM: CSW met with patient who put his father and mother on speaker phone. CSW informed them of the situation that transportation will not be able to be set up until Monday. CSW noted no current concerns and notes parents reported they will plan on meeting with the patient this afternoon. CSW will continue to follow.  CSW was contacted by attending who reported patient is cleared for discharge. CSW reached out to Marta with [facility] and was unable to reach her. CSW spoke with [facility] operator, who transferred to AOD. CSW informed AOD of request for transfer for the patient to the spinal center. CSW noted that as it was specialized transport it would require the approval of an administrator. CSW notes per AOD report no one at the hospital today could approve it and would require waiting until Monday to approve transportation. CSW informed attending and will follow as needed.  Joey McKinney, LCSW, LCAS-A Clinical Social Worker II [phone] 

## 2018-09-03 NOTE — Progress Notes (Signed)
PROGRESS NOTE    [name] [name]  FHL:[id number] DOB: Dec 12, 1965 DOA: 06/06/2018 PCP: Helane Rima, MD     Brief Narrative:  [name] [name] is a 52 y.o.year old malewith medical history significant for gout, hypertension, underwent anterior cervical corpectomy of C4 on 3/73/4287 with complicated postoperative course including development of multiple PEs and DVTs started on anticoagulation and subsequently developed spinal hematoma requiring reexploration on 03/28/2018 status post placement of IVC filter then discharged to SNF who presented on 9/24/2019as a transfer from [facility] with cervical spine MRI showing changes consistent with cord compression, cord edema and concern for ischemia. His hospital course has been complicated by cervical decompression laminectomy, postoperative hematomas and multiple evacuations, new bilateral lower extremity DVTs, epidural hematoma (68/11, 57/26) complicating anticoagulation therapy (briefly on argatroban and discontinued given recurrent hematomas), new PE now stable on eliquis, bone marrow biopsy for evaluation of thrombocytopenia, platelet and endplate transfusions.Cervical spine 08/19/18 MRI which neurosurgery evaluated and deemed it looked stable and recommended no further work-up. Patient has been stable throughout this week until spike a fever to 102 overnight on 12/15 followed by another fever overnight on 12/16. In both settings patient was noted to be somewhat altered with symptoms of disorientation but no new neurologic deficits. We will work-up is included negative chest x-ray, no acute abnormalities on CT abdomen, no repeat or worsening DVTs on lower extremity venous duplex and no growth on blood cultures x2 obtain on 12/16.  New events last 24 hours / Subjective: Doing well, gout resolving   Assessment & Plan:   Principal Problem:   Recurrent pulmonary embolism (HCC) Active Problems:   Myelopathy (HCC)   Pressure injury of  skin   Thrombocytopenia (HCC)   Acute blood loss anemia   Benign essential HTN   Cord compression (HCC)   Fever   Spondylosis, cervical, with myelopathy   Labile blood pressure   Orthostatic hypotension   Tachycardia-bradycardia syndrome (HCC)   Adjustment disorder with mixed anxiety and depressed mood   G6PD deficiency   G6PD deficiency anemia (HCC)   Ascending aortic aneurysm (HCC)   Difficult intravenous access   DVT (deep venous thrombosis) (HCC)   SIRS (systemic inflammatory response syndrome) (HCC)   Bilateral lower extremity ankle and toe pain concerning for acute gout flare -Elevated ESR and CRP. Solu-Medrol IV started, transition to prednisone today  -Continue allopurinol, colchicine, indomethacin   Severe cervical spine stenosis -Stable quadriparesis, right upper extremity stronger than left upper extremity (consistent with prior exams based on chart review by neurosurgery evaluation) status post decompressive surgery complicated by multiple postoperative hematomas and evacuations. Most recent MRI of cervical spine on 12/7 reports chronic sequelae of compressive myelopathy.Caseearlierdiscussed with neurosurgery who deemed slight improvement from prior films since surgery, no further plans for surgery given neurologically stable and very high risk for further bleeding in the cervical area per NSG. Will need aggressive rehabilitation with spinal rehab   Fevers -Most recent fever few days ago, afebrile since.  Possibly due to gout, no other clinical evidence of infection.  He does have a leukocytosis which is new but this may be related to the steroids. No further fevers noted   Acute pulmonary embolism and DVT -S/p IVC filter July-2019 -Multiple events of postoperative epidural hematoma requiring to hold anticoagulation. Subsequently had DVT therefore started on bivalirudin 11/13 for submassive PE. Still had DVTs therefore was switched to ARC Atrovent 11/29 per  hematology. -Currently he is back on Eliquis  Bilateral upper and lower extremity edema -  Likely from hypoalbuminemia. No evidence of CHF. He needs to be moved around more often and needs to get to a rehab. Resume home oral Lasix therapy. Input and output, daily weight  Incidental finding of pneumoperitoneum 11/23 -This is resolved.  Thrombocytopenia -Resolved. Had bone marrow biopsy requiring initiation of platelet transfusion. 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  Essential hypertension -Continue to monitor on IV hydralazine as needed  Pressure ulcer on right initial tuberosity, stage II -Local wound care  Severe osteoarthritis of the left knee -Pain control  Peripheral neuropathy -Gabapentin 600 mg 3 times daily  DVT prophylaxis: Eliquis Code Status: Full Family Communication: At bedside  Disposition Plan: Pending discharge to [facility] Re[facility] in [place], hopefully 12/23 if can set up transport    Antimicrobials:  Anti-infectives (From admission, onward)   Start     Dose/Rate Route Frequency Ordered Stop   08/13/18 1000  ceFEPIme (MAXIPIME) 1 g in sodium chloride 0.9 % 100 mL IVPB     1 g 200 mL/hr over 30 Minutes Intravenous Every 8 hours 08/13/18 0949 08/14/18 2259   08/05/18 1415  vancomycin (VANCOCIN) IVPB 1000 mg/200 mL premix  Status:  Discontinued     1,000 mg 200 mL/hr over 60 Minutes Intravenous Every 8 hours 08/05/18 1410 08/08/18 1257   08/05/18 1415  ceFEPIme (MAXIPIME) 1 g in sodium chloride 0.9 % 100 mL IVPB     1 g 200 mL/hr over 30 Minutes Intravenous Every 8 hours 08/05/18 1410 08/12/18 0544   08/04/18 0645  levofloxacin (LEVAQUIN) IVPB 500 mg  Status:  Discontinued     500 mg 100 mL/hr over 60 Minutes Intravenous Every 24 hours 08/04/18 0642 08/05/18 1316   07/25/18 1700  fluconazole (DIFLUCAN) IVPB 400 mg  Status:  Discontinued     400 mg 100 mL/hr over 120 Minutes Intravenous Every 24 hours 07/25/18 1633 07/27/18 1415    07/12/18 2100  ceFAZolin (ANCEF) IVPB 2g/100 mL premix  Status:  Discontinued     2 g 200 mL/hr over 30 Minutes Intravenous Every 8 hours 07/12/18 1555 07/15/18 1050   07/10/18 2229  bacitracin 50,000 Units in sodium chloride 0.9 % 500 mL irrigation  Status:  Discontinued       As needed 07/10/18 2230 07/10/18 2300   07/07/18 1133  bacitracin 50,000 Units in sodium chloride 0.9 % 500 mL irrigation  Status:  Discontinued       As needed 07/07/18 1133 07/07/18 1357   07/06/18 0900  ceFAZolin (ANCEF) IVPB 2g/100 mL premix  Status:  Discontinued     2 g 200 mL/hr over 30 Minutes Intravenous Every 8 hours 07/06/18 0810 07/12/18 1555   07/01/18 2240  bacitracin 50,000 Units in sodium chloride 0.9 % 500 mL irrigation  Status:  Discontinued       As needed 07/01/18 2240 07/01/18 2324   06/30/18 1330  doxycycline (VIBRA-TABS) tablet 100 mg  Status:  Discontinued     100 mg Oral Every 12 hours 06/30/18 1329 07/12/18 1429   06/27/18 1000  fluconazole (DIFLUCAN) tablet 200 mg  Status:  Discontinued     200 mg Oral Daily 06/27/18 0725 07/04/18 0720   06/22/18 2315  ceFAZolin (ANCEF) IVPB 2g/100 mL premix     2 g 200 mL/hr over 30 Minutes Intravenous Every 8 hours 06/22/18 2314 06/27/18 1519   06/22/18 1828  bacitracin 50,000 Units in sodium chloride 0.9 % 500 mL irrigation  Status:  Discontinued  As needed 06/22/18 1828 06/22/18 1915   06/22/18 1700  ceFAZolin (ANCEF) 3 g in dextrose 5 % 50 mL IVPB  Status:  Discontinued     3 g 100 mL/hr over 30 Minutes Intravenous  Once 06/22/18 1647 06/22/18 2101   06/14/18 2100  ceFAZolin (ANCEF) IVPB 2g/100 mL premix     2 g 200 mL/hr over 30 Minutes Intravenous Every 8 hours 06/14/18 1712 06/17/18 0800   06/14/18 1414  bacitracin 50,000 Units in sodium chloride 0.9 % 500 mL irrigation  Status:  Discontinued       As needed 06/14/18 1414 06/14/18 1647   06/14/18 1215  ceFAZolin (ANCEF) 3 g in dextrose 5 % 50 mL IVPB     3 g 160 mL/hr over 30 Minutes  Intravenous To [facility] 06/14/18 1202 06/14/18 2225   06/14/18 1200  ceFAZolin (ANCEF) IVPB 2g/100 mL premix  Status:  Discontinued     2 g 200 mL/hr over 30 Minutes Intravenous On call to O.R. 06/14/18 1157 06/14/18 2140       Objective: Vitals:   09/02/18 1900 09/03/18 0100 09/03/18 0300 09/03/18 0700  BP: 126/85 102/68 110/78 104/76  Pulse: 96 85 86 85  Resp:  _0 Temp: 97.7 F (36.5 C) 98.2 F (36.8 C) 98.2 F (36.8 C) 97.9 F (36.6 C)  TempSrc: Oral Oral Oral Oral  SpO2: 100% 97% 97% 97%  Weight:      Height:        Intake/Output Summary (Last 24 hours) at 09/03/2018 1203 Last data filed at 09/03/2018 0700 Gross per 24 hour  Intake 480 ml  Output 1350 ml  Net -870 ml   Filed Weights   08/30/18 0449 09/01/18 0500 09/02/18 0600  Weight: 121.1 kg 123 kg 124.4 kg    Examination: General exam: Appears calm and comfortable  Respiratory system: Clear to auscultation. Respiratory effort normal. Cardiovascular system: S1 & S2 heard, RRR. No JVD, murmurs, rubs, gallops or clicks. No pedal edema. Gastrointestinal system: Abdomen is nondistended, soft and nontender. No organomegaly or masses felt. Normal bowel sounds heard. Central nervous system: Alert and oriented. Extremities: Symmetric Skin: No erythema of left great toe  Psychiatry: Judgement and insight appear normal. Mood & affect appropriate.    Data Reviewed: I have personally reviewed following labs and imaging studies  CBC: Recent Labs  Lab 08/30/18 0356 08/31/18 0433 09/01/18 0447 09/02/18 0339 09/03/18 0519  WBC 14.8* 15.5* 21.3* 22.3* 16.2*  HGB 11.0* 10.4* 10.1* 10.9* 10.7*  HCT 36.5* 33.2* 32.5* 34.3* 34.7*  MCV 95.3 95.1 94.2 95.0 94.6  PLT 163 174 151 185 347   Basic Metabolic Panel: Recent Labs  Lab 08/29/18 1533 08/30/18 0356 08/31/18 0433 09/01/18 0447 09/02/18 0339  NA 141 140 138 140 140  K 4.0 3.8 4.5 5.0 4.5  CL 96* 97* 95* 97* 96*  CO2 _1 GLUCOSE 119* 114* 175* 162* 192*  BUN 27* 25* 32* 47* 59*  CREATININE 0.72 0.81 0.79 0.85 1.00  CALCIUM 9.9 9.8 10.0 10.3 9.9   GFR: Estimated Creatinine Clearance: 112.7 mL/min (by C-G formula based on SCr of 1 mg/dL). Liver Function Tests: No results for input(s): AST, ALT, ALKPHOS, BILITOT, PROT, ALBUMIN in the last 168 hours. No results for input(s): LIPASE, AMYLASE in the last 168 hours. No results for input(s): AMMONIA in the last 168 hours. Coagulation Profile: No results for input(s): INR, PROTIME in the last 168 hours. Cardiac Enzymes: No  results for input(s): CKTOTAL, CKMB, CKMBINDEX, TROPONINI in the last 168 hours. BNP (last 3 results) No results for input(s): PROBNP in the last 8760 hours. HbA1C: No results for input(s): HGBA1C in the last 72 hours. CBG: Recent Labs  Lab 09/02/18 1138 09/02/18 1731 09/02/18 2111 09/02/18 2320 09/03/18 0742  GLUCAP 192* 149* 140* 155* 83   Lipid Profile: No results for input(s): CHOL, HDL, LDLCALC, TRIG, CHOLHDL, LDLDIRECT in the last 72 hours. Thyroid Function Tests: No results for input(s): TSH, T4TOTAL, FREET4, T3FREE, THYROIDAB in the last 72 hours. Anemia Panel: No results for input(s): VITAMINB12, FOLATE, FERRITIN, TIBC, IRON, RETICCTPCT in the last 72 hours. Sepsis Labs: No results for input(s): PROCALCITON, LATICACIDVEN in the last 168 hours.  Recent Results (from the past 240 hour(s))  Culture, blood (Routine X 2) w Reflex to ID Panel     Status: None   Collection Time: 08/28/18  7:28 AM  Result Value Ref Range Status   Specimen Description BLOOD LEFT HAND  Final   Special Requests   Final    BOTTLES DRAWN AEROBIC ONLY Blood Culture adequate volume   Culture   Final    NO GROWTH 5 DAYS Performed at [facility], [address], [place], [place] [address]    Report Status 09/02/2018 FINAL  Final  Culture, blood (Routine X 2) w Reflex to ID Panel     Status: None   Collection Time: 08/28/18  7:35 AM   Result Value Ref Range Status   Specimen Description BLOOD RIGHT HAND  Final   Special Requests   Final    BOTTLES DRAWN AEROBIC AND ANAEROBIC Blood Culture adequate volume   Culture   Final    NO GROWTH 5 DAYS Performed at [facility], [place] [address] [place], [place] [address]    Report Status 09/02/2018 FINAL  Final       Radiology Studies: No results found.    Scheduled Meds: . allopurinol  100 mg Oral Daily  . apixaban  5 mg Oral BID  . baclofen  10 mg Oral BID  . bismuth subsalicylate  30 mL Oral TID AC & HS  . clonazePAM  0.5 mg Oral BID  . colchicine  0.6 mg Oral BID  . famotidine  20 mg Oral Daily  . feeding supplement (ENSURE ENLIVE)  237 mL Oral TID BM  . furosemide  40 mg Oral BID  . gabapentin  600 mg Oral TID  . indomethacin  25 mg Oral BID WC  . insulin aspart  0-20 Units Subcutaneous TID WC  . insulin aspart  0-5 Units Subcutaneous QHS  . insulin glargine  15 Units Subcutaneous QHS  . magic mouthwash w/lidocaine  5 mL Oral TID  . mouth rinse  15 mL Mouth Rinse BID  . multivitamin with minerals  1 tablet Oral Daily  . omeprazole  20 mg Oral BID AC  . potassium chloride  40 mEq Oral Daily  . predniSONE  50 mg Oral Q breakfast  . sodium chloride flush  10-40 mL Intracatheter Q12H   Continuous Infusions: . sodium chloride 10 mL/hr at 08/15/18 0200     LOS: 88 days    Time spent: 20 minutes   Dessa Phi, DO [facility] [email or web address] Password [facility] 09/03/2018, 12:03 PM

## 2018-09-04 LAB — BASIC METABOLIC PANEL
Anion gap: 17 — ABNORMAL HIGH (ref 5–15)
BUN: 56 mg/dL — ABNORMAL HIGH (ref 6–20)
CO2: 28 mmol/L (ref 22–32)
Calcium: 9.6 mg/dL (ref 8.9–10.3)
Chloride: 97 mmol/L — ABNORMAL LOW (ref 98–111)
Creatinine, Ser: 0.64 mg/dL (ref 0.61–1.24)
GFR calc Af Amer: >60 mL/min (ref >60)
GFR calc non Af Amer: >60 mL/min (ref >60)
Glucose, Bld: 144 mg/dL — ABNORMAL HIGH (ref 70–99)
Potassium: 4.3 mmol/L (ref 3.5–5.1)
Sodium: 142 mmol/L (ref 135–145)

## 2018-09-04 LAB — CBC
HCT: 34.5 % — ABNORMAL LOW (ref 39.0–52.0)
Hemoglobin: 11.1 g/dL — ABNORMAL LOW (ref 13.0–17.0)
MCH: 30.3 pg (ref 26.0–34.0)
MCHC: 32.2 g/dL (ref 30.0–36.0)
MCV: 94.3 fL (ref 80.0–100.0)
PLATELETS: 166 10*3/uL (ref 150–400)
RBC: 3.66 MIL/uL — ABNORMAL LOW (ref 4.22–5.81)
RDW: 16.5 % — ABNORMAL HIGH (ref 11.5–15.5)
WBC: 17.7 10*3/uL — AB (ref 4.0–10.5)
nRBC: 0.1 % (ref 0.0–0.2)

## 2018-09-04 LAB — GLUCOSE, CAPILLARY
GLUCOSE-CAPILLARY: 138 mg/dL — AB (ref 70–99)
Glucose-Capillary: 87 mg/dL (ref 70–99)
Glucose-Capillary: 240 mg/dL — ABNORMAL HIGH (ref 70–99)
Glucose-Capillary: 170 mg/dL — ABNORMAL HIGH (ref 70–99)

## 2018-09-04 MED ORDER — ALLOPURINOL 100 MG PO TABS: 100.0000 mg | ORAL_TABLET | Freq: Every day | ORAL | 0 refills | Status: AC

## 2018-09-04 MED ORDER — DOCUSATE SODIUM 100 MG PO CAPS: 100.0000 mg | ORAL_CAPSULE | Freq: Two times a day (BID) | ORAL | 0 refills | Status: AC | PRN

## 2018-09-04 MED ORDER — OXYCODONE HCL 20 MG PO TABS: 20.0000 mg | ORAL_TABLET | ORAL | 0 refills | Status: AC | PRN

## 2018-09-04 MED ORDER — OMEPRAZOLE 20 MG PO CPDR: 20.0000 mg | DELAYED_RELEASE_CAPSULE | Freq: Two times a day (BID) | ORAL | 0 refills | Status: AC

## 2018-09-04 MED ORDER — FAMOTIDINE 20 MG PO TABS: 20.0000 mg | ORAL_TABLET | Freq: Every day | ORAL | 0 refills | Status: AC

## 2018-09-04 MED ORDER — FUROSEMIDE 40 MG PO TABS: 40.0000 mg | ORAL_TABLET | Freq: Two times a day (BID) | ORAL | 0 refills | Status: AC

## 2018-09-04 MED ORDER — CLONAZEPAM 0.5 MG PO TABS: 0.5000 mg | ORAL_TABLET | Freq: Two times a day (BID) | ORAL | 0 refills | Status: AC

## 2018-09-04 MED ORDER — COLLAGENASE 250 UNIT/GM EX OINT
TOPICAL_OINTMENT | Freq: Every day | CUTANEOUS | Status: DC
  Administered 2018-09-04 – 2018-09-10 (×7): via TOPICAL
  Filled 2018-09-04 (×3): qty 30

## 2018-09-04 MED ORDER — GABAPENTIN 300 MG PO CAPS: 600.0000 mg | ORAL_CAPSULE | Freq: Three times a day (TID) | ORAL | 0 refills | Status: AC

## 2018-09-04 NOTE — Clinical Social Work Note (Addendum)
CSW talked [facility] Erline LevineMarta Cheek [facility] today [phone], ext. [phone] or [phone]) regarding patient. CSW advised that MD accepted patient into their spinal center and he can discharge on Monday, 12/30. Marta asked and was advised that patient is not on contact precautions. Marta requested and discharge summary faxed to her today - (fax # [phone] MD and patient advised of when discharge can occur. CSW's 12/20 handoff given information [facility].  CSW will continue to follow and [facility].   Genelle BalVanessa Jisella Ashenfelter, MSW, LCSW Licensed Clinical Social Worker Clinical Social Work Department [facility] [phone]

## 2018-09-04 NOTE — Discharge Summary (Addendum)
Physician Discharge Summary  [name] [name] RCB:[id number] DOB: 1966-05-09 DOA: 06/06/2018  PCP: Helane Rima, MD  Admit date: 06/06/2018 Discharge date: 09/11/2018  Admitted From: Home Disposition:  Spinal Rehab in [facility]   Recommendations for Outpatient Follow-up:  1. Follow up with PCP in 1 week 2. Follow up with Dr. Saintclair [facility]   Discharge Condition: Stable CODE STATUS: Full  Diet recommendation: Regular   Brief/Interim Summary: [name] [name] is a 52 y.o.year old malewith medical history significant for gout, hypertension, underwent anterior cervical corpectomy of C4 on 04/15/2121 with complicated postoperative course including development of multiple PEs and DVTs started on anticoagulation and subsequently developed spinal hematoma requiring reexploration on 03/28/2018 status post placement of IVC filter then discharged to SNF who presented on 9/24/2019as a transfer from [facility] with cervical spine MRI showing changes consistent with cord compression, cord edema and concern for ischemia. His hospital course has been complicated by cervical decompression laminectomy, postoperative hematomas and multiple evacuations, new bilateral lower extremity DVTs, epidural hematoma (48/25, 00/37) complicating anticoagulation therapy (briefly on argatroban and discontinued given recurrent hematomas), new PE now stable on eliquis, bone marrow biopsy for evaluation of thrombocytopenia, platelet and endplate transfusions.Cervical spine 08/19/18 MRI which neurosurgery evaluated and deemed it looked stable and recommended no further work-up. Patient has been stable throughout this week until spike a fever to 102 overnight on 12/15 followed by another fever overnight on 12/16. In both settings patient was noted to be somewhat altered with symptoms of disorientation but no new neurologic deficits. Work-up included negative chest x-ray, no acute abnormalities on CT abdomen, no repeat or  worsening DVTs on lower extremity venous duplex and no growth on blood cultures x2 obtain on 12/16. He was treated for gout with steroids with improvement in gout symptoms, has remained afebrile. He was deemed stable for discharge to spinal rehab facility.   Discharge Diagnoses:  Principal Problem:   Recurrent pulmonary embolism (HCC) Active Problems:   Myelopathy (HCC)   Pressure injury of skin   Thrombocytopenia (HCC)   Acute blood loss anemia   Benign essential HTN   Cord compression (HCC)   Fever   Spondylosis, cervical, with myelopathy   Labile blood pressure   Orthostatic hypotension   Tachycardia-bradycardia syndrome (HCC)   Adjustment disorder with mixed anxiety and depressed mood   G6PD deficiency   G6PD deficiency anemia (HCC)   Ascending aortic aneurysm (HCC)   Difficult intravenous access   DVT (deep venous thrombosis) (HCC)   SIRS (systemic inflammatory response syndrome) (HCC)  Bilateral lower extremity ankle and toe pain concerning for acute gout flare -Elevated ESR and CRP  -Continue allopurinol, colchicine, indomethacin   -Treated with steroids with improvement   Severe cervical spine stenosis -Stable quadriparesis, right upper extremity stronger than left upper extremity (consistent with prior exams based on chart review by neurosurgery evaluation) status post decompressive surgery complicated by multiple postoperative hematomas and evacuations. Most recent MRI of cervical spine on 12/7 reports chronic sequelae of compressive myelopathy.Caseearlierdiscussed with neurosurgery who deemed slight improvement from prior films since surgery, no further plans for surgery given neurologically stable and very high risk for further bleeding in the cervical area per NSG. Will need aggressive rehabilitation with spinal rehab   Fevers -Most recent fever few days ago, afebrile since. Possibly due to gout, no other clinical evidence of infection. He does have a  leukocytosis which is new but this may be related to the steroids. No further fevers noted   Acute pulmonary  embolism and DVT -S/p IVC filter July-2019 -Multiple events of postoperative epidural hematoma requiring to hold anticoagulation. Subsequently had DVT therefore started on bivalirudin 11/13 for submassive PE. Still had DVTs therefore was switched to ARC Atrovent 11/29 per hematology. -Currently he is back on Eliquis  Bilateral upper and lower extremity edema -Likely from hypoalbuminemia. No evidence of CHF. He needs to be moved around more often and needs to get to a rehab. Resume home oral Lasix therapy. Input and output, daily weight  Incidental finding of pneumoperitoneum 11/23 -This is resolved.  Thrombocytopenia -Resolved. Had bone marrow biopsy requiring initiation of platelet transfusion. 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  Essential hypertension -Continue to monitor. BP stable.   Pressure ulcer on right initial tuberosity, stage II -Local wound care  Severe osteoarthritis of the left knee -Pain control  Peripheral neuropathy -Gabapentin 600 mg 3 times daily    Discharge Instructions  Discharge Instructions    Diet - low sodium heart healthy   Complete by:  As directed    Diet general   Complete by:  As directed    Increase activity slowly   Complete by:  As directed    Increase activity slowly   Complete by:  As directed      Allergies as of 09/11/2018      Reactions   Nsaids Other (See Comments)   G6PD DEFICIENCY   Sulfa Antibiotics Other (See Comments)   G6PD DEFICIENCY   Protonix [pantoprazole Sodium] Diarrhea   Severe diarrhea, Upset stomach      Medication List    STOP taking these medications   aspirin 81 MG EC tablet   Cyanocobalamin 5000 MCG/ML Liqd   hydrochlorothiazide 12.5 MG capsule Commonly known as:  MICROZIDE   saccharomyces boulardii 250 MG capsule Commonly known as:  FLORASTOR      TAKE these medications   acetaminophen 325 MG tablet Commonly known as:  TYLENOL Take 2 tablets (650 mg total) by mouth every 4 (four) hours as needed for mild pain ((score 1 to 3) or temp > 100.5).   allopurinol 100 MG tablet Commonly known as:  ZYLOPRIM Take 1 tablet (100 mg total) by mouth daily.   apixaban 5 MG Tabs tablet Commonly known as:  ELIQUIS Take 1 tablet (5 mg total) by mouth 2 (two) times daily.   baclofen 10 MG tablet Commonly known as:  LIORESAL Take 1 tablet (10 mg total) by mouth 2 (two) times daily as needed for muscle spasms.   clonazePAM 0.5 MG tablet Commonly known as:  KLONOPIN Take 1 tablet (0.5 mg total) by mouth 2 (two) times daily.   colchicine 0.6 MG tablet Take 1 tablet (0.6 mg total) by mouth daily.   docusate sodium 100 MG capsule Commonly known as:  COLACE Take 1 capsule (100 mg total) by mouth 2 (two) times daily as needed for mild constipation.   famotidine 20 MG tablet Commonly known as:  PEPCID Take 1 tablet (20 mg total) by mouth daily.   furosemide 40 MG tablet Commonly known as:  LASIX Take 1 tablet (40 mg total) by mouth 2 (two) times daily.   gabapentin 300 MG capsule Commonly known as:  NEURONTIN Take 2 capsules (600 mg total) by mouth 3 (three) times daily.   omeprazole 20 MG capsule Commonly known as:  PRILOSEC Take 1 capsule (20 mg total) by mouth 2 (two) times daily before a meal.   Oxycodone HCl 20 MG Tabs Take 1 tablet (20  mg total) by mouth every 3 (three) hours as needed for severe pain ((score 7 to 10)). What changed:    medication strength  how much to take      Follow-up Information    Kary Kos, MD Follow up.   Specialty:  Neurosurgery Contact information: [address] [place] [address] [phone]        Helane Rima, MD Follow up.   Specialty:  Family Medicine Contact information: [place] [address] [phone]           Allergies  Allergen Reactions  . Nsaids Other (See Comments)    G6PD DEFICIENCY  . Sulfa Antibiotics Other (See Comments)    G6PD DEFICIENCY  . Protonix [Pantoprazole Sodium] Diarrhea    Severe diarrhea, Upset stomach    Procedures/Studies: Ct Knee Left Wo Contrast  Result Date: 08/17/2018 CLINICAL DATA:  Left knee pain and swelling.  Evaluate for fracture. EXAM: CT OF THE LEFT KNEE WITHOUT CONTRAST TECHNIQUE: Multidetector CT imaging of the LEFT knee was performed according to the standard protocol. Multiplanar CT image reconstructions were also generated. COMPARISON:  06/07/2018 and 08/16/2018 radiographs of the left knee. FINDINGS: Bones/Joint/Cartilage Moderate medial femorotibial joint space narrowing with spurring off the tibial plateau and femoral condyles as well as of the tibial spine are identified consistent with osteoarthritis. No joint effusion, acute appearing fracture, suspicious osseous lesion or bone destruction is seen. Ligaments Suboptimally assessed by CT. Muscles and Tendons No intramuscular hemorrhage or significant atrophy. The quadriceps and patellar tendons are unremarkable and intact. Soft tissues No significant soft tissue swelling, mass or hematoma. IMPRESSION: Osteoarthritis of the knee predominantly along the medial femorotibial compartment. No significant joint effusion. No acute osseous appearing abnormality. Electronically Signed   By: Ashley Royalty M.D.   On: 08/17/2018 16:58   Mr Cervical Spine Wo Contrast  Result Date: 08/19/2018 CLINICAL DATA:  52 y/o M; new right lower extremity weakness. History of known spinal cord injury for follow-up. EXAM: MRI CERVICAL SPINE WITHOUT CONTRAST TECHNIQUE: Multiplanar, multisequence MR imaging of the cervical spine was performed. No intravenous contrast was administered. COMPARISON:  07/23/2018, 07/10/2018, 07/01/2018, 06/22/2018 MRI of the cervical spine. FINDINGS: Alignment: Straightening of cervical lordosis without  listhesis. Vertebrae: No fracture, evidence of discitis, or bone lesion. C4 corpectomy, C2-C5 posterior fusion, C3-C5 ACDF, and C3-C5 posterior decompression. Stable fluid collection within the laminectomy bed and posterior paraspinal soft tissues. No new compressive effect on the thecal sac. 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 Cord: Increased cord signal at the C4 level, possibly present on the prior cervical MRI (core largely obscured by artifact on the prior study), and decreased in comparison with 10/19 MRIs during the period of cord compression. Posterior Fossa, vertebral arteries, paraspinal tissues: As above. Disc levels: C2-3: Posterior fusion. Stable mild disc bulge and facet hypertrophy. No significant foraminal or canal stenosis. 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 C4-5: Corpectomy, fusion, laminectomy. Residual central ossification of the posterior longitudinal ligament as well as left-greater-than-right uncovertebral and facet hypertrophy. Moderate left foraminal stenosis and mild spinal canal stenosis. No compression of the thecal sac. C5-6: Stable disc osteophyte complex with bilateral uncovertebral and facet hypertrophy. Mild foraminal and spinal canal stenosis C6-7: No significant disc displacement, foraminal stenosis, or canal stenosis. C7-T1:  No significant disc displacement, foraminal stenosis, or canal stenosis. IMPRESSION: 1. Increased cord signal at the C4 level. Cord signal is decreased from the 10/19 cervical MRI studies where there is cord compression. Cord is largely obscured by artifact on the prior study, suboptimal comparison. Findings likely represent chronic sequelae of compressive myelopathy. 2. 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 C2-C5 fusion postsurgical changes are stable. No acute osseous abnormality. 3. Stable fluid collection within the C3-C5 laminectomy bed and posterior soft tissues. No compression of thecal sac. 4. Stable cervical spondylosis with left anterior cord impingement at C3-4. No high-grade spinal canal stenosis. Electronically Signed   By: Kristine Garbe M.D.   On: 08/19/2018 20:48   Ct Abdomen Pelvis W Contrast  Result Date: 08/29/2018 CLINICAL DATA:  52 y/o  M; intermittent fever. Stable quadriparesis. EXAM: CT ABDOMEN AND PELVIS WITH CONTRAST TECHNIQUE: Multidetector CT imaging of the abdomen and pelvis was performed using the standard protocol following bolus administration of intravenous contrast. CONTRAST:  153m OMNIPAQUE IOHEXOL 300 MG/ML  SOLN COMPARISON:  08/06/2018 CT abdomen and pelvis. FINDINGS: Lower chest: Platelike atelectasis or scarring at the right lung base. Elevated left hemidiaphragm. Hepatobiliary: No focal liver abnormality is seen. No gallstones, gallbladder wall thickening, or biliary dilatation. Pancreas: Unremarkable. No pancreatic ductal dilatation or surrounding inflammatory changes. Spleen: Normal in size without focal abnormality. Adrenals/Urinary Tract: Adrenal glands are unremarkable. Kidneys are normal, without renal calculi, focal lesion, or hydronephrosis. Bladder is unremarkable. Stomach/Bowel: Stomach and splenic flexure of colon are above the field of view. Visible stomach is within normal limits. Appendix appears normal. No evidence of bowel wall thickening, distention, or inflammatory changes. Vascular/Lymphatic: IVC filter in situ. No significant vascular findings. Reproductive: Prostate is unremarkable. Other: No abdominal wall hernia or abnormality. No abdominopelvic ascites. Musculoskeletal: No fracture is seen. Mild-to-moderate spondylosis of the visible spine greatest at L4-5 where there is loss of intervertebral disc space  height and endplate Schmorl's nodes. IMPRESSION: 1. Elevated left hemidiaphragm. Stomach and splenic flexure of colon or above the field of view. 2. No acute process identified. Unremarkable CT of abdomen and pelvis. Electronically Signed   By: LKristine GarbeM.D.   On: 08/29/2018 21:15   Dg Chest Port 1 View  Result Date: 08/28/2018 CLINICAL DATA:  Unexplained fever EXAM: PORTABLE CHEST 1 VIEW COMPARISON:  08/16/2018 FINDINGS: Removal of the right IJ central venous catheter since 08/16/2018. Low lung volumes noted with basilar atelectasis, worse on the left. Stable heart size and vascularity. No effusion or pneumothorax. Trachea is midline. Degenerative changes of the spine. IMPRESSION: Low lung volumes with basilar atelectasis. Electronically Signed   By: MJerilynn Mages  Shick M.D.   On: 08/28/2018 10:25   Dg Chest Port 1 View  Result Date: 08/16/2018 CLINICAL DATA:  Fever EXAM: PORTABLE CHEST 1 VIEW COMPARISON:  08/12/2018 FINDINGS: Heart is normal in size. Left upper extremity PICC is been removed. Stable right jugular central venous catheter. Low lung volumes. Bibasilar atelectasis left greater than right. No pneumothorax or pleural effusion. IMPRESSION: Bibasilar atelectasis left greater than right. Electronically Signed   By: AMarybelle KillingsM.D.   On: 08/16/2018 12:18   Dg Chest Port 1 View  Result Date: 08/12/2018 CLINICAL DATA:  Right central line placement EXAM: PORTABLE CHEST 1 VIEW COMPARISON:  08/09/2018 FINDINGS: Right IJ central line tip mid SVC level. Left PICC line tip in a similar position. Exam is rotated to the right. Mild cardiomegaly. Low lung volumes persist with basilar atelectasis, worse  on the right. No enlarging effusion or pneumothorax. Aorta atherosclerotic. Similar lucency of the upper abdomen compatible with known pneumoperitoneum. IMPRESSION: Right IJ central line tip mid SVC level. Persistent low lung volumes with basilar atelectasis Upper abdominal pneumoperitoneum as  before. Electronically Signed   By: Jerilynn Mages.  Shick M.D.   On: 08/12/2018 14:59   Dg Knee Complete 4 Views Left  Result Date: 08/16/2018 CLINICAL DATA:  Painful may for several weeks, no known injury, subsequent encounter EXAM: LEFT KNEE - COMPLETE 4+ VIEW COMPARISON:  06/07/2018 FINDINGS: Degenerative changes are again identified primarily in the medial joint space but to a lesser degree in the patellofemoral space. No acute fracture or dislocation is noted. No joint effusion is seen. IMPRESSION: Degenerative change stable from the previous exam. Electronically Signed   By: Inez Catalina M.D.   On: 08/16/2018 13:10   Vas [place] Lower Extremity Venous (dvt)  Result Date: 08/29/2018  Lower Venous Study Indications: Extensive DVT since 06/26/18, unable to anticoagulate at first due to bleed risk. Now on [facility].  Performing Technologist: Landry Mellow RDMS, RVT  Examination Guidelines: A complete evaluation includes B-mode imaging, spectral Doppler, color Doppler, and power Doppler as needed of all accessible portions of each vessel. Bilateral testing is considered an integral part of a complete examination. Limited examinations for reoccurring indications may be performed as noted.  Right Venous Findings: +---------+---------------+---------+-----------+----------+-------+          CompressibilityPhasicitySpontaneityPropertiesSummary +---------+---------------+---------+-----------+----------+-------+ CFV      Partial        Yes      Yes                          +---------+---------------+---------+-----------+----------+-------+ SFJ      Partial                                              +---------+---------------+---------+-----------+----------+-------+ FV Prox  Partial                                              +---------+---------------+---------+-----------+----------+-------+ FV Mid   Partial                                               +---------+---------------+---------+-----------+----------+-------+ FV DistalPartial        Yes      Yes                          +---------+---------------+---------+-----------+----------+-------+ PFV      Partial                                              +---------+---------------+---------+-----------+----------+-------+ POP      Partial        Yes      Yes                          +---------+---------------+---------+-----------+----------+-------+ PTV      Partial                                              +---------+---------------+---------+-----------+----------+-------+  PERO     Partial                                              +---------+---------------+---------+-----------+----------+-------+  Left Venous Findings: +---------+---------------+---------+-----------+----------+-------+          CompressibilityPhasicitySpontaneityPropertiesSummary +---------+---------------+---------+-----------+----------+-------+ CFV      Partial        Yes      Yes                          +---------+---------------+---------+-----------+----------+-------+ SFJ      Partial                                              +---------+---------------+---------+-----------+----------+-------+ FV Prox  Partial                                              +---------+---------------+---------+-----------+----------+-------+ FV Mid   Partial        Yes      Yes                          +---------+---------------+---------+-----------+----------+-------+ FV DistalPartial                                              +---------+---------------+---------+-----------+----------+-------+ PFV      Partial                                              +---------+---------------+---------+-----------+----------+-------+ POP      Partial        Yes      Yes                           +---------+---------------+---------+-----------+----------+-------+ PTV      Partial                                              +---------+---------------+---------+-----------+----------+-------+ PERO     Partial                                              +---------+---------------+---------+-----------+----------+-------+ DVT appears improved BLE since last study 07/30/18   Summary: Right: Findings consistent with age indeterminate deep vein thrombosis involving the right common femoral vein, right femoral vein, right proximal profunda vein, right popliteal vein, right posterior tibial vein, and right peroneal vein. 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  *See table(s) above for measurements  and observations. Electronically signed by Deitra Mayo MD on 08/29/2018 at 4:34:02 PM.    Final     Echo 11/14 Study Conclusions  - Left ventricle: The cavity size was normal. Wall thickness was   increased in a pattern of mild LVH. Systolic function was normal.   The estimated ejection fraction was in the range of 60% to 65%.   Wall motion was normal; there were no regional wall motion   abnormalities. Doppler parameters are consistent with abnormal   left ventricular relaxation (grade 1 diastolic dysfunction). - Aortic valve: There was trivial regurgitation.    Discharge Exam: Vitals:   09/11/18 0556 09/11/18 0700  BP:  131/82  Pulse: 87   Resp:    Temp: 98.3 F (36.8 C) 98.5 F (36.9 C)  SpO2: 100% 100%       General:  Appears in no acute distress  Cardiovascular: S1S2 RRR  Respiratory: Clear bilaterally  Abdomen: soft, nontender  Musculoskeletal: no edema of lower extremities  Neuro- A O x3, motor 5/5/in RUE, 0/5 in other extremities    The results of significant  diagnostics from this hospitalization (including imaging, microbiology, ancillary and laboratory) are listed below for reference.     Microbiology: No results found for this or any previous visit (from the past 240 hour(s)).   Labs: BNP (last 3 results) No results for input(s): BNP in the last 8760 hours. Basic Metabolic Panel: No results for input(s): NA, K, CL, CO2, GLUCOSE, BUN, CREATININE, CALCIUM, MG, PHOS in the last 168 hours. Liver Function Tests: No results for input(s): AST, ALT, ALKPHOS, BILITOT, PROT, ALBUMIN in the last 168 hours. No results for input(s): LIPASE, AMYLASE in the last 168 hours. No results for input(s): AMMONIA in the last 168 hours. CBC: Recent Labs  Lab 09/07/18 0352  WBC 15.9*  HGB 11.2*  HCT 35.9*  MCV 93.0  PLT 190   Cardiac Enzymes: No results for input(s): CKTOTAL, CKMB, CKMBINDEX, TROPONINI in the last 168 hours. BNP: Invalid input(s): POCBNP CBG: Recent Labs  Lab 09/09/18 1208 09/09/18 1643 09/09/18 2120 09/10/18 0734 09/10/18 1615  GLUCAP 123* 157* 140* 85 139*   D-Dimer No results for input(s): DDIMER in the last 72 hours. Hgb A1c No results for input(s): HGBA1C in the last 72 hours. Lipid Profile No results for input(s): CHOL, HDL, LDLCALC, TRIG, CHOLHDL, LDLDIRECT in the last 72 hours. Thyroid function studies No results for input(s): TSH, T4TOTAL, T3FREE, THYROIDAB in the last 72 hours.  Invalid input(s): FREET3 Anemia work up No results for input(s): VITAMINB12, FOLATE, FERRITIN, TIBC, IRON, RETICCTPCT in the last 72 hours. Urinalysis    Component Value Date/Time   COLORURINE AMBER (A) 06/07/2018 0900   APPEARANCEUR CLOUDY (A) 06/07/2018 0900   LABSPEC 1.028 06/07/2018 0900   PHURINE 5.0 06/07/2018 0900   GLUCOSEU NEGATIVE 06/07/2018 0900   HGBUR SMALL (A) 06/07/2018 0900   BILIRUBINUR NEGATIVE 06/07/2018 0900   KETONESUR 5 (A) 06/07/2018 0900   PROTEINUR 30 (A) 06/07/2018 0900   NITRITE NEGATIVE 06/07/2018 0900    LEUKOCYTESUR NEGATIVE 06/07/2018 0900   Sepsis Labs Invalid input(s): PROCALCITONIN,  WBC,  LACTICIDVEN Microbiology No results found for this or any previous visit (from the past 240 hour(s)).   Patient was seen and examined on the day of discharge and was found to be in stable condition. Time coordinating discharge: 40 minutes including assessment and coordination of care, as well as examination of the patient.   SIGNED:  Oswald Hillock, MD [facility]   If 7PM-7AM, please contact night-coverage [email or web address] Password [email or web address] 09/11/2018, 7:57 AM

## 2018-09-04 NOTE — Progress Notes (Signed)
Physical Therapy Treatment Patient Details Name: [name] FreedCornell Tolin MRN: [id number] DOB: 1965/11/02 Today's Date: 09/04/2018    History of Present Illness 52 y.o. male admitted on 06/06/18 for bil LE weakness and muscle spasm after multiple falls (with resultant R knee meniscus tear placed in bledsoe brace) following a recent ACDF at [facility] in July 2019 (complicated by PE (s/p IVC filter), CIR, and then SNF placement before finally returning home with his parents).  In the ED MRI of c-spine which showed post operative changes and cord edema and possibility of ischemia.  His R knee was aspirated on 06/09/18.  S/p posterior cervical decompression and fusion on 06/14/18. Entire acute course complicated by hypotension in standing (thought to be autonomic) and by finding of multiple LE DVTs (already has an IVC filter in place, but per MDs notes at risk for blocking the filter with these clots).  Pt with post op issues with incisional bleeding s/p 2 evacuations of hematomas and placement of hemovac (06/22/18 and 07/01/18) as well as multiple units of plasma.  Cardiology consulted 07/05/18 due to bradycardia thought to be from his newly started blood thinner.  Pt with another re-accumulation of epidural hematoma with increased L sided weakness and cord compression with emergent evacuation surgery 10/25 & 07/10/18.  Pt also needing more platelets post op.  Bone marrow bx 07/12/18.  Pt found to have submassive PE on 07/27/18 and re-started on anticoagulation. PMHx of HTN, gout, anemia, DDD (lumbar).  08/10/18 found to have a L UE thrombus (same arm as his PICC line).      PT Comments    Pt tolerating more and more physical activity EOB.  He was up >20 mins working EOB with PT/OT today.  He was highly motivated by his son being present and observing.  He is due to d/c to SNF for spinal cord rehab in [place] today.  PT to follow acutely until d/c confirmed.     Follow Up Recommendations  SNF     Equipment Recommendations   Wheelchair (measurements PT);Wheelchair cushion (measurements PT);Other (comment);Hospital bed(likely 20x20 tilt in space, power drive and recline, hoyer)    Recommendations for Other Services   NA     Precautions / Restrictions Precautions Precautions: Fall;Cervical;Other (comment) Precaution Comments: Several bed sores on bottom.  left ischium is worse than R ischium Required Braces or Orthoses: Cervical Brace Cervical Brace: Soft collar(PRN) Other Brace: bil PRAFOs    Mobility  Bed Mobility Overal bed mobility: Needs Assistance Bed Mobility: Rolling;Sidelying to Sit;Sit to Sidelying Rolling: +2 for physical assistance;Max assist;Total assist Sidelying to sit: +2 for physical assistance;Total assist;Max assist   Sit to supine: +2 for physical assistance;Total assist   General bed mobility comments: Two person total assist to roll bil, encouraged trying to bring arms across chest and squeezing shoulder blade to help return to supine from sidelying.  Assist needed at trunk to bring chest off of bed, we did stop half way up to allow pt to get his right elbow out to help support him before progressing the rest of the way up.  Total assist at trunk and legs to return to supine.         Balance Overall balance assessment: Needs assistance Sitting-balance support: Feet supported;Bilateral upper extremity supported Sitting balance-Leahy Scale: Poor Sitting balance - Comments: Max assist EOB, attepts at moving head to reposition and weight shift, attempts at supporting with hands, just difficult on compliant mattress. Sat EOB for ~20 mins working on midline posture, head/neck ROM  focusing on left lateral flexion due to R UT tightness, scapular retractions, upper trunk rotations, UE exercises and attempted R LE LAQs with AAROM.                                      Cognition Arousal/Alertness: Awake/alert Behavior During Therapy: WFL for tasks assessed/performed Overall  Cognitive Status: Within Functional Limits for tasks assessed                                 General Comments: Motivated by son being in the room      Exercises General Exercises - Lower Extremity Ankle Circles/Pumps: PROM;Both;10 reps Long Arc Quad: AROM;Right;10 reps Heel Slides: PROM;Both;10 reps Other Exercises Other Exercises: Hip IR x10 bil        Pertinent Vitals/Pain Pain Assessment: Faces Faces Pain Scale: Hurts little more Pain Location: buttocks Pain Descriptors / Indicators: Sore Pain Intervention(s): Limited activity within patient's tolerance;Monitored during session;Repositioned(turned to L side lyin at end of session (was R sidelying))        PT Goals (current goals can now be found in the care plan section) Acute Rehab PT Goals Patient Stated Goal: to get started with his daily intensive therapy at the Spine center in [place] Progress towards PT goals: Progressing toward goals    Frequency    Min 2X/week      PT Plan Current plan remains appropriate    Co-evaluation PT/OT/SLP Co-Evaluation/Treatment: Yes Reason for Co-Treatment: Complexity of the patient's impairments (multi-system involvement);Necessary to address cognition/behavior during functional activity;For patient/therapist safety PT goals addressed during session: Mobility/safety with mobility;Balance;Strengthening/ROM        AM-PAC PT "6 Clicks" Mobility   Outcome Measure  Help needed turning from your back to your side while in a flat bed without using bedrails?: Total Help needed moving from lying on your back to sitting on the side of a flat bed without using bedrails?: Total Help needed moving to and from a bed to a chair (including a wheelchair)?: Total Help needed standing up from a chair using your arms (e.g., wheelchair or bedside chair)?: Total Help needed to walk in hospital room?: Total Help needed climbing 3-5 steps with a railing? : Total 6 Click Score: 6     End of Session   Activity Tolerance: Patient tolerated treatment well Patient left: in bed;with call bell/phone within reach   PT Visit Diagnosis: Other abnormalities of gait and mobility (R26.89);Other symptoms and signs involving the nervous system (R29.898);Muscle weakness (generalized) (M62.81)     Time: 9629-52841105-1158 PT Time Calculation (min) (ACUTE ONLY): 53 min  Charges:  $Therapeutic Activity: 23-37 mins                    Regenia Erck B. Brailee Riede, PT, DPT  Acute Rehabilitation [phone] pager #[phone] office   09/04/2018, 12:12 PM

## 2018-09-04 NOTE — Progress Notes (Signed)
Occupational Therapy Treatment Patient Details Name: [name] [name] MRN: [id number] DOB: [birth year] Today's Date: 09/04/2018    History of present illness 52 y.o. male admitted on 06/06/18 for bil LE weakness and muscle spasm after multiple falls (with resultant R knee meniscus tear placed in bledsoe brace) following a recent ACDF at [facility] in July 2019 (complicated by PE (s/p IVC filter), CIR, and then SNF placement before finally returning home with his parents).  In the ED MRI of c-spine which showed post operative changes and cord edema and possibility of ischemia.  His R knee was aspirated on 06/09/18.  S/p posterior cervical decompression and fusion on 06/14/18. Entire acute course complicated by hypotension in standing (thought to be autonomic) and by finding of multiple LE DVTs (already has an IVC filter in place, but per MDs notes at risk for blocking the filter with these clots).  Pt with post op issues with incisional bleeding s/p 2 evacuations of hematomas and placement of hemovac (06/22/18 and 07/01/18) as well as multiple units of plasma.  Cardiology consulted 07/05/18 due to bradycardia thought to be from his newly started blood thinner.  Pt with another re-accumulation of epidural hematoma with increased L sided weakness and cord compression with emergent evacuation surgery 10/25 & 07/10/18.  Pt also needing more platelets post op.  Bone marrow bx 07/12/18.  Pt found to have submassive PE on 07/27/18 and re-started on anticoagulation. PMHx of HTN, gout, anemia, DDD (lumbar).  08/10/18 found to have a L UE thrombus (same arm as his PICC line).     OT comments  Pt presents supine in bed pleasant and motivated to work with therapy. Pt with increased motivation levels with family present during treatment session as well. Focus of session on seated EOB activity. Pt requiring two person assist for bed mobility and overall maxA (+2 safety) for sitting balance. Seated activity including lateral leans,  trunk rotation, scapular retractions, and UE A/AAROM and stretching.  Pt with BM at end of session with peri-care provided at bed level (totalA). Will continue per POC, pt anticipating d/c to rehab facility later today.    Follow Up Recommendations  SNF;Supervision/Assistance - 24 hour    Equipment Recommendations  Other (comment)(TBD in next venue)          Precautions / Restrictions Precautions Precautions: Fall;Cervical;Other (comment) Precaution Comments: Several bed sores on bottom.  left ischium is worse than R ischium Required Braces or Orthoses: Cervical Brace Cervical Brace: Soft collar(PRN) Other Brace: bil PRAFOs Restrictions Weight Bearing Restrictions: No       Mobility Bed Mobility Overal bed mobility: Needs Assistance Bed Mobility: Rolling;Sidelying to Sit;Sit to Sidelying Rolling: +2 for physical assistance;Max assist;Total assist Sidelying to sit: +2 for physical assistance;Total assist;Max assist   Sit to supine: +2 for physical assistance;Total assist   General bed mobility comments: Two person total assist to roll bil, encouraged trying to bring arms across chest and squeezing shoulder blade to help return to supine from sidelying.  Assist needed at trunk to bring chest off of bed, we did stop half way up to allow pt to get his right elbow out to help support him before progressing the rest of the way up.  Total assist at trunk and legs to return to supine.  Transfers                      Balance Overall balance assessment: Needs assistance Sitting-balance support: Feet supported;Bilateral upper extremity supported Sitting balance-Leahy Scale:  Poor Sitting balance - Comments: Max assist EOB, attepts at moving head to reposition and weight shift, attempts at supporting with hands, just difficult on compliant mattress. Sat EOB for ~20 mins working on midline posture, head/neck ROM focusing on left lateral flexion due to R UT tightness, scapular  retractions, upper trunk rotations, UE exercises and attempted R LE LAQs with AAROM.                                     ADL either performed or assessed with clinical judgement   ADL Overall ADL's : Needs assistance/impaired                             Toileting- Clothing Manipulation and Hygiene: Total assistance;+2 for physical assistance;Bed level Toileting - Clothing Manipulation Details (indicate cue type and reason): totalA for peri-care after BM at bed level; pt aware of bowels starting and verbalizing to therapist      Functional mobility during ADLs: Maximal assistance;+2 for physical assistance;+2 for safety/equipment(bed mobility and sitting EOB)       Vision       Perception     Praxis      Cognition Arousal/Alertness: Awake/alert Behavior During Therapy: WFL for tasks assessed/performed Overall Cognitive Status: Within Functional Limits for tasks assessed                                 General Comments: Motivated by son being in the room        Exercises Exercises: General Lower Extremity;General Upper Extremity General Exercises - Lower Extremity Ankle Circles/Pumps: PROM;Both;10 reps Long Arc Quad: AROM;Right;10 reps Heel Slides: PROM;Both;10 reps Other Exercises Other Exercises: Hip IR x10 bil Other Exercises: A/AA/PROM to bil UEs while seated EOB   Shoulder Instructions       General Comments      Pertinent Vitals/ Pain       Pain Assessment: Faces Faces Pain Scale: Hurts little more Pain Location: buttocks Pain Descriptors / Indicators: Sore Pain Intervention(s): Limited activity within patient's tolerance;Repositioned;Monitored during session(repositioned to L side end of session (was R at start))  Home Living                                          Prior Functioning/Environment              Frequency  Min 2X/week        Progress Toward Goals  OT Goals(current  goals can now be found in the care plan section)  Progress towards OT goals: Progressing toward goals  Acute Rehab OT Goals Patient Stated Goal: to get started with his daily intensive therapy at the Sp[facility] center in [place] OT Goal Formulation: With patient Time For Goal Achievement: 09/11/18 Potential to Achieve Goals: Fair  Plan Discharge plan remains appropriate    Co-evaluation    PT/OT/SLP Co-Evaluation/Treatment: Yes Reason for Co-Treatment: For patient/therapist safety;Complexity of the patient's impairments (multi-system involvement);To address functional/ADL transfers PT goals addressed during session: Mobility/safety with mobility;Balance;Strengthening/ROM OT goals addressed during session: Strengthening/ROM;ADL's and self-care      AM-PAC OT "6 Clicks" Daily Activity     Outcome Measure   Help from another person eating meals?:  A Little Help from another person taking care of personal grooming?: A Little Help from another person toileting, which includes using toliet, bedpan, or urinal?: Total Help from another person bathing (including washing, rinsing, drying)?: A Lot Help from another person to put on and taking off regular upper body clothing?: Total Help from another person to put on and taking off regular lower body clothing?: Total 6 Click Score: 11    End of Session Equipment Utilized During Treatment: Cervical collar  OT Visit Diagnosis: Other abnormalities of gait and mobility (R26.89);Muscle weakness (generalized) (M62.81)   Activity Tolerance Patient tolerated treatment well   Patient Left in bed;with call bell/phone within reach;with family/visitor present   Nurse Communication Mobility status        Time: [id number] OT Time Calculation (min): 53 min  Charges: OT General Charges $OT Visit: 1 Visit OT Treatments $Therapeutic Activity: 23-37 mins  Marcy SirenBreanna Kathlynn Swofford, OT [facility] Services Pager [phone] Office  [phone]    Orlando PennerBreanna L Sophronia Varney 09/04/2018, 2:22 PM

## 2018-09-05 LAB — GLUCOSE, CAPILLARY
GLUCOSE-CAPILLARY: 129 mg/dL — AB (ref 70–99)
Glucose-Capillary: 80 mg/dL (ref 70–99)
Glucose-Capillary: 104 mg/dL — ABNORMAL HIGH (ref 70–99)
Glucose-Capillary: 191 mg/dL — ABNORMAL HIGH (ref 70–99)

## 2018-09-05 NOTE — Progress Notes (Signed)
PROGRESS NOTE    [name] [name]  QDI:[id number] DOB: 09/02/1966 DOA: 06/06/2018 PCP: Helane Rima, MD     Brief Narrative:  [name] [name] is a 52 y.o.year old malewith medical history significant for gout, hypertension, underwent anterior cervical corpectomy of C4 on 12/19/6806 with complicated postoperative course including development of multiple PEs and DVTs started on anticoagulation and subsequently developed spinal hematoma requiring reexploration on 03/28/2018 status post placement of IVC filter then discharged to SNF who presented on 9/24/2019as a transfer from [facility] with cervical spine MRI showing changes consistent with cord compression, cord edema and concern for ischemia. His hospital course has been complicated by cervical decompression laminectomy, postoperative hematomas and multiple evacuations, new bilateral lower extremity DVTs, epidural hematoma (81/10, 31/59) complicating anticoagulation therapy (briefly on argatroban and discontinued given recurrent hematomas), new PE now stable on eliquis, bone marrow biopsy for evaluation of thrombocytopenia, platelet and endplate transfusions.Cervical spine 08/19/18 MRI which neurosurgery evaluated and deemed it looked stable and recommended no further work-up. Patient has been stable throughout this week until spike a fever to 102 overnight on 12/15 followed by another fever overnight on 12/16. In both settings patient was noted to be somewhat altered with symptoms of disorientation but no new neurologic deficits. We will work-up is included negative chest x-ray, no acute abnormalities on CT abdomen, no repeat or worsening DVTs on lower extremity venous duplex and no growth on blood cultures x2 obtain on 12/16. He was treated for gout with steroids with improvement in gout symptoms, has remained afebrile. He was deemed stable for discharge to spinal rehab facility.   New events last 24 hours / Subjective: Continues to  inquire about getting increased dose of colchicine until diarrhea, which is the regimen he took previously. Apparently rehab facility cannot take patient until 12/30   Assessment & Plan:   Principal Problem:   Recurrent pulmonary embolism ([place]) Active Problems:   Myelopathy (HCC)   Pressure injury of skin   Thrombocytopenia (HCC)   Acute blood loss anemia   Benign essential HTN   Cord compression (HCC)   Fever   Spondylosis, cervical, with myelopathy   Labile blood pressure   Orthostatic hypotension   Tachycardia-bradycardia syndrome (HCC)   Adjustment disorder with mixed anxiety and depressed mood   G6PD deficiency   G6PD deficiency anemia (HCC)   Ascending aortic aneurysm (HCC)   Difficult intravenous access   DVT (deep venous thrombosis) (HCC)   SIRS (systemic inflammatory response syndrome) (HCC)   Bilateral lower extremity ankle and toe pain concerning for acute gout flare -Elevated ESR and CRP -Continue allopurinol, colchicine, indomethacin   -Treated with steroids with improvement   Severe cervical spine stenosis -Stable quadriparesis, right upper extremity stronger than left upper extremity (consistent with prior exams based on chart review by neurosurgery evaluation) status post decompressive surgery complicated by multiple postoperative hematomas and evacuations. Most recent MRI of cervical spine on 12/7 reports chronic sequelae of compressive myelopathy.Caseearlierdiscussed with neurosurgery who deemed slight improvement from prior films since surgery, no further plans for surgery given neurologically stable and very high risk for further bleeding in the cervical area per NSG. Will need aggressive rehabilitation with spinal rehab   Fevers -Most recent fever few days ago, afebrile since. Possibly due to gout, no other clinical evidence of infection. He does have a leukocytosis which is new but this may be related to the steroids. No further fevers noted    Acute pulmonary embolism and DVT -S/p IVC filter July-2019 -Multiple  events of postoperative epidural hematoma requiring to hold anticoagulation. Subsequently had DVT therefore started on bivalirudin 11/13 for submassive PE. Still had DVTs therefore was switched to ARC Atrovent 11/29 per hematology. -Currently he is back on Eliquis  Bilateral upper and lower extremity edema -Likely from hypoalbuminemia. No evidence of CHF. He needs to be moved around more often and needs to get to a rehab. Resume home oral Lasix therapy. Input and output, daily weight  Incidental finding of pneumoperitoneum 11/23 -This is resolved.  Thrombocytopenia -Resolved. Had bone marrow biopsy requiring initiation of platelet transfusion. 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  Essential hypertension -Continue to monitor. BP stable.   Pressure ulcer on right initial tuberosity, stage II -Local wound care  Severe osteoarthritis of the left knee -Pain control  Peripheral neuropathy -Gabapentin 600 mg 3 times daily  DVT prophylaxis: Eliquis Code Status: Full Family Communication: No family at bedside  Disposition Plan: Pending discharge to Spine Rehab in [place], hopefully 12/30     Antimicrobials:  Anti-infectives (From admission, onward)   Start     Dose/Rate Route Frequency Ordered Stop   08/13/18 1000  ceFEPIme (MAXIPIME) 1 g in sodium chloride 0.9 % 100 mL IVPB     1 g 200 mL/hr over 30 Minutes Intravenous Every 8 hours 08/13/18 0949 08/14/18 2259   08/05/18 1415  vancomycin (VANCOCIN) IVPB 1000 mg/200 mL premix  Status:  Discontinued     1,000 mg 200 mL/hr over 60 Minutes Intravenous Every 8 hours 08/05/18 1410 08/08/18 1257   08/05/18 1415  ceFEPIme (MAXIPIME) 1 g in sodium chloride 0.9 % 100 mL IVPB     1 g 200 mL/hr over 30 Minutes Intravenous Every 8 hours 08/05/18 1410 08/12/18 0544   08/04/18 0645  levofloxacin (LEVAQUIN) IVPB 500 mg  Status:  Discontinued      500 mg 100 mL/hr over 60 Minutes Intravenous Every 24 hours 08/04/18 0642 08/05/18 1316   07/25/18 1700  fluconazole (DIFLUCAN) IVPB 400 mg  Status:  Discontinued     400 mg 100 mL/hr over 120 Minutes Intravenous Every 24 hours 07/25/18 1633 07/27/18 1415   07/12/18 2100  ceFAZolin (ANCEF) IVPB 2g/100 mL premix  Status:  Discontinued     2 g 200 mL/hr over 30 Minutes Intravenous Every 8 hours 07/12/18 1555 07/15/18 1050   07/10/18 2229  bacitracin 50,000 Units in sodium chloride 0.9 % 500 mL irrigation  Status:  Discontinued       As needed 07/10/18 2230 07/10/18 2300   07/07/18 1133  bacitracin 50,000 Units in sodium chloride 0.9 % 500 mL irrigation  Status:  Discontinued       As needed 07/07/18 1133 07/07/18 1357   07/06/18 0900  ceFAZolin (ANCEF) IVPB 2g/100 mL premix  Status:  Discontinued     2 g 200 mL/hr over 30 Minutes Intravenous Every 8 hours 07/06/18 0810 07/12/18 1555   07/01/18 2240  bacitracin 50,000 Units in sodium chloride 0.9 % 500 mL irrigation  Status:  Discontinued       As needed 07/01/18 2240 07/01/18 2324   06/30/18 1330  doxycycline (VIBRA-TABS) tablet 100 mg  Status:  Discontinued     100 mg Oral Every 12 hours 06/30/18 1329 07/12/18 1429   06/27/18 1000  fluconazole (DIFLUCAN) tablet 200 mg  Status:  Discontinued     200 mg Oral Daily 06/27/18 0725 07/04/18 0720   06/22/18 2315  ceFAZolin (ANCEF) IVPB 2g/100 mL premix  2 g 200 mL/hr over 30 Minutes Intravenous Every 8 hours 06/22/18 2314 06/27/18 1519   06/22/18 1828  bacitracin 50,000 Units in sodium chloride 0.9 % 500 mL irrigation  Status:  Discontinued       As needed 06/22/18 1828 06/22/18 1915   06/22/18 1700  ceFAZolin (ANCEF) 3 g in dextrose 5 % 50 mL IVPB  Status:  Discontinued     3 g 100 mL/hr over 30 Minutes Intravenous  Once 06/22/18 1647 06/22/18 2101   06/14/18 2100  ceFAZolin (ANCEF) IVPB 2g/100 mL premix     2 g 200 mL/hr over 30 Minutes Intravenous Every 8 hours 06/14/18 1712 06/17/18  0800   06/14/18 1414  bacitracin 50,000 Units in sodium chloride 0.9 % 500 mL irrigation  Status:  Discontinued       As needed 06/14/18 1414 06/14/18 1647   06/14/18 1215  ceFAZolin (ANCEF) 3 g in dextrose 5 % 50 mL IVPB     3 g 160 mL/hr over 30 Minutes Intravenous To [facility] 06/14/18 1202 06/14/18 2225   06/14/18 1200  ceFAZolin (ANCEF) IVPB 2g/100 mL premix  Status:  Discontinued     2 g 200 mL/hr over 30 Minutes Intravenous On call to O.R. 06/14/18 1157 06/14/18 2140       Objective: Vitals:   09/04/18 2256 09/04/18 2300 09/05/18 0500 09/05/18 0740  BP:  119/83 (!) 127/92 108/77  Pulse:    78  Resp:    18  Temp: 98.2 F (36.8 C)  (!) 97.5 F (36.4 C) 97.9 F (36.6 C)  TempSrc: Oral  Oral   SpO2:    97%  Weight:   122.4 kg   Height:        Intake/Output Summary (Last 24 hours) at 09/05/2018 1129 Last data filed at 09/05/2018 0500 Gross per 24 hour  Intake 240 ml  Output 700 ml  Net -460 ml   Filed Weights   09/01/18 0500 09/02/18 0600 09/05/18 0500  Weight: 123 kg 124.4 kg 122.4 kg     Examination: General exam: Appears calm and comfortable  Respiratory system: Clear to auscultation. Respiratory effort normal. Cardiovascular system: S1 & S2 heard, RRR. No JVD, murmurs, rubs, gallops or clicks. No pedal edema. Gastrointestinal system: Abdomen is nondistended, soft and nontender. No organomegaly or masses felt. Normal bowel sounds heard. Central nervous system: Alert and oriented. No new focal neurological deficits. Extremities: Symmetric Skin: No rashes, lesions or ulcers on exposed skin  Psychiatry: Judgement and insight appear stable    Data Reviewed: I have personally reviewed following labs and imaging studies  CBC: Recent Labs  Lab 08/31/18 0433 09/01/18 0447 09/02/18 0339 09/03/18 0519 09/04/18 0124  WBC 15.5* 21.3* 22.3* 16.2* 17.7*  HGB 10.4* 10.1* 10.9* 10.7* 11.1*  HCT 33.2* 32.5* 34.3* 34.7* 34.5*  MCV 95.1 94.2 95.0 94.6 94.3   PLT 174 151 185 161 834   Basic Metabolic Panel: Recent Labs  Lab 08/30/18 0356 08/31/18 0433 09/01/18 0447 09/02/18 0339 09/04/18 0124  NA 140 138 140 140 142  K 3.8 4.5 5.0 4.5 4.3  CL 97* 95* 97* 96* 97*  CO2 '30 28 29 28 28  ' GLUCOSE 114* 175* 162* 192* 144*  BUN 25* 32* 47* 59* 56*  CREATININE 0.81 0.79 0.85 1.00 0.64  CALCIUM 9.8 10.0 10.3 9.9 9.6   GFR: Estimated Creatinine Clearance: 138.1 mL/min (by C-G formula based on SCr of 0.64 mg/dL). Liver Function Tests: No results for input(s): AST, ALT, ALKPHOS,  BILITOT, PROT, ALBUMIN in the last 168 hours. No results for input(s): LIPASE, AMYLASE in the last 168 hours. No results for input(s): AMMONIA in the last 168 hours. Coagulation Profile: No results for input(s): INR, PROTIME in the last 168 hours. Cardiac Enzymes: No results for input(s): CKTOTAL, CKMB, CKMBINDEX, TROPONINI in the last 168 hours. BNP (last 3 results) No results for input(s): PROBNP in the last 8760 hours. HbA1C: No results for input(s): HGBA1C in the last 72 hours. CBG: Recent Labs  Lab 09/04/18 0720 09/04/18 1219 09/04/18 1603 09/04/18 2154 09/05/18 0741  GLUCAP 87 138* 240* 170* 80   Lipid Profile: No results for input(s): CHOL, HDL, LDLCALC, TRIG, CHOLHDL, LDLDIRECT in the last 72 hours. Thyroid Function Tests: No results for input(s): TSH, T4TOTAL, FREET4, T3FREE, THYROIDAB in the last 72 hours. Anemia Panel: No results for input(s): VITAMINB12, FOLATE, FERRITIN, TIBC, IRON, RETICCTPCT in the last 72 hours. Sepsis Labs: No results for input(s): PROCALCITON, LATICACIDVEN in the last 168 hours.  Recent Results (from the past 240 hour(s))  Culture, blood (Routine X 2) w Reflex to ID Panel     Status: None   Collection Time: 08/28/18  7:28 AM  Result Value Ref Range Status   Specimen Description BLOOD LEFT HAND  Final   Special Requests   Final    BOTTLES DRAWN AEROBIC ONLY Blood Culture adequate volume   Culture   Final    NO  GROWTH 5 DAYS Performed at [facility], [address], [place], [place] [address]    Report Status 09/02/2018 FINAL  Final  Culture, blood (Routine X 2) w Reflex to ID Panel     Status: None   Collection Time: 08/28/18  7:35 AM  Result Value Ref Range Status   Specimen Description BLOOD RIGHT HAND  Final   Special Requests   Final    BOTTLES DRAWN AEROBIC AND ANAEROBIC Blood Culture adequate volume   Culture   Final    NO GROWTH 5 DAYS Performed at [facility], [place] [address] [place], [place] [address]    Report Status 09/02/2018 FINAL  Final       Radiology Studies: No results found.    Scheduled Meds: . allopurinol  100 mg Oral Daily  . apixaban  5 mg Oral BID  . baclofen  10 mg Oral BID  . bismuth subsalicylate  30 mL Oral TID AC & HS  . clonazePAM  0.5 mg Oral BID  . colchicine  0.6 mg Oral BID  . collagenase   Topical Daily  . famotidine  20 mg Oral Daily  . feeding supplement (ENSURE ENLIVE)  237 mL Oral TID BM  . furosemide  40 mg Oral BID  . gabapentin  600 mg Oral TID  . indomethacin  25 mg Oral BID WC  . insulin aspart  0-20 Units Subcutaneous TID WC  . insulin aspart  0-5 Units Subcutaneous QHS  . insulin glargine  15 Units Subcutaneous QHS  . magic mouthwash w/lidocaine  5 mL Oral TID  . mouth rinse  15 mL Mouth Rinse BID  . multivitamin with minerals  1 tablet Oral Daily  . omeprazole  20 mg Oral BID AC  . potassium chloride  40 mEq Oral Daily  . predniSONE  50 mg Oral Q breakfast  . sodium chloride flush  10-40 mL Intracatheter Q12H   Continuous Infusions: . sodium chloride 10 mL/hr at 08/15/18 0200     LOS: 90 days  Time spent: 20 minutes   Dessa Phi, DO [facility] [email or web address] Password [facility] 09/05/2018, 11:29 AM

## 2018-09-06 LAB — GLUCOSE, CAPILLARY
Glucose-Capillary: 130 mg/dL — ABNORMAL HIGH (ref 70–99)
Glucose-Capillary: 175 mg/dL — ABNORMAL HIGH (ref 70–99)
Glucose-Capillary: 197 mg/dL — ABNORMAL HIGH (ref 70–99)

## 2018-09-06 NOTE — Progress Notes (Signed)
PROGRESS NOTE    [name] [name]  FMB:[id number] DOB: 06-08-1966 DOA: 06/06/2018 PCP: Helane Rima, MD     Brief Narrative:  [name] [name] is a 52 y.o.year old malewith medical history significant for gout, hypertension, underwent anterior cervical corpectomy of C4 on 03/13/7792 with complicated postoperative course including development of multiple PEs and DVTs started on anticoagulation and subsequently developed spinal hematoma requiring reexploration on 03/28/2018 status post placement of IVC filter then discharged to SNF who presented on 9/24/2019as a transfer from [facility] with cervical spine MRI showing changes consistent with cord compression, cord edema and concern for ischemia. His hospital course has been complicated by cervical decompression laminectomy, postoperative hematomas and multiple evacuations, new bilateral lower extremity DVTs, epidural hematoma (90/30, 09/23) complicating anticoagulation therapy (briefly on argatroban and discontinued given recurrent hematomas), new PE now stable on eliquis, bone marrow biopsy for evaluation of thrombocytopenia, platelet and endplate transfusions.Cervical spine 08/19/18 MRI which neurosurgery evaluated and deemed it looked stable and recommended no further work-up. Patient has been stable throughout this week until spike a fever to 102 overnight on 12/15 followed by another fever overnight on 12/16. In both settings patient was noted to be somewhat altered with symptoms of disorientation but no new neurologic deficits. We will work-up is included negative chest x-ray, no acute abnormalities on CT abdomen, no repeat or worsening DVTs on lower extremity venous duplex and no growth on blood cultures x2 obtain on 12/16. He was treated for gout with steroids with improvement in gout symptoms, has remained afebrile. He was deemed stable for discharge to spinal rehab facility.   New events last 24 hours / Subjective: States that every  time he eats the hospital food here, he has diarrhea.  No other physical concerns this morning  Assessment & Plan:   Principal Problem:   Recurrent pulmonary embolism (HCC) Active Problems:   Myelopathy (HCC)   Pressure injury of skin   Thrombocytopenia (HCC)   Acute blood loss anemia   Benign essential HTN   Cord compression (HCC)   Fever   Spondylosis, cervical, with myelopathy   Labile blood pressure   Orthostatic hypotension   Tachycardia-bradycardia syndrome (HCC)   Adjustment disorder with mixed anxiety and depressed mood   G6PD deficiency   G6PD deficiency anemia (HCC)   Ascending aortic aneurysm (HCC)   Difficult intravenous access   DVT (deep venous thrombosis) (HCC)   SIRS (systemic inflammatory response syndrome) (HCC)   Bilateral lower extremity ankle and toe pain concerning for acute gout flare -Elevated ESR and CRP -Continue allopurinol, colchicine, indomethacin   -Treated with steroids with improvement - stop prednisone   Severe cervical spine stenosis -Stable quadriparesis, right upper extremity stronger than left upper extremity (consistent with prior exams based on chart review by neurosurgery evaluation) status post decompressive surgery complicated by multiple postoperative hematomas and evacuations. Most recent MRI of cervical spine on 12/7 reports chronic sequelae of compressive myelopathy.Caseearlierdiscussed with neurosurgery who deemed slight improvement from prior films since surgery, no further plans for surgery given neurologically stable and very high risk for further bleeding in the cervical area per NSG. Will need aggressive rehabilitation with spinal rehab   Fevers -Most recent fever few days ago, afebrile since. Possibly due to gout, no other clinical evidence of infection. He does have a leukocytosis which is new but this may be related to the steroids. No further fevers noted   Acute pulmonary embolism and DVT -S/p IVC filter  July-2019 -Multiple events of postoperative epidural  hematoma requiring to hold anticoagulation. Subsequently had DVT therefore started on bivalirudin 11/13 for submassive PE. Still had DVTs therefore was switched to ARC Atrovent 11/29 per hematology. -Currently he is back on Eliquis  Bilateral upper and lower extremity edema -Likely from hypoalbuminemia. No evidence of CHF. He needs to be moved around more often and needs to get to a rehab. Resume home oral Lasix therapy. Input and output, daily weight  Incidental finding of pneumoperitoneum 11/23 -This is resolved.  Thrombocytopenia -Resolved. Had bone marrow biopsy requiring initiation of platelet transfusion. 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  Essential hypertension -Continue to monitor. BP stable.   Pressure ulcer on right initial tuberosity, stage II -Local wound care  Severe osteoarthritis of the left knee -Pain control  Peripheral neuropathy -Gabapentin 600 mg 3 times daily  DVT prophylaxis: Eliquis Code Status: Full Family Communication: No family at bedside  Disposition Plan: Pending discharge to Spine Rehab in [place], hopefully 12/30     Antimicrobials:  Anti-infectives (From admission, onward)   Start     Dose/Rate Route Frequency Ordered Stop   08/13/18 1000  ceFEPIme (MAXIPIME) 1 g in sodium chloride 0.9 % 100 mL IVPB     1 g 200 mL/hr over 30 Minutes Intravenous Every 8 hours 08/13/18 0949 08/14/18 2259   08/05/18 1415  vancomycin (VANCOCIN) IVPB 1000 mg/200 mL premix  Status:  Discontinued     1,000 mg 200 mL/hr over 60 Minutes Intravenous Every 8 hours 08/05/18 1410 08/08/18 1257   08/05/18 1415  ceFEPIme (MAXIPIME) 1 g in sodium chloride 0.9 % 100 mL IVPB     1 g 200 mL/hr over 30 Minutes Intravenous Every 8 hours 08/05/18 1410 08/12/18 0544   08/04/18 0645  levofloxacin (LEVAQUIN) IVPB 500 mg  Status:  Discontinued     500 mg 100 mL/hr over 60 Minutes Intravenous  Every 24 hours 08/04/18 0642 08/05/18 1316   07/25/18 1700  fluconazole (DIFLUCAN) IVPB 400 mg  Status:  Discontinued     400 mg 100 mL/hr over 120 Minutes Intravenous Every 24 hours 07/25/18 1633 07/27/18 1415   07/12/18 2100  ceFAZolin (ANCEF) IVPB 2g/100 mL premix  Status:  Discontinued     2 g 200 mL/hr over 30 Minutes Intravenous Every 8 hours 07/12/18 1555 07/15/18 1050   07/10/18 2229  bacitracin 50,000 Units in sodium chloride 0.9 % 500 mL irrigation  Status:  Discontinued       As needed 07/10/18 2230 07/10/18 2300   07/07/18 1133  bacitracin 50,000 Units in sodium chloride 0.9 % 500 mL irrigation  Status:  Discontinued       As needed 07/07/18 1133 07/07/18 1357   07/06/18 0900  ceFAZolin (ANCEF) IVPB 2g/100 mL premix  Status:  Discontinued     2 g 200 mL/hr over 30 Minutes Intravenous Every 8 hours 07/06/18 0810 07/12/18 1555   07/01/18 2240  bacitracin 50,000 Units in sodium chloride 0.9 % 500 mL irrigation  Status:  Discontinued       As needed 07/01/18 2240 07/01/18 2324   06/30/18 1330  doxycycline (VIBRA-TABS) tablet 100 mg  Status:  Discontinued     100 mg Oral Every 12 hours 06/30/18 1329 07/12/18 1429   06/27/18 1000  fluconazole (DIFLUCAN) tablet 200 mg  Status:  Discontinued     200 mg Oral Daily 06/27/18 0725 07/04/18 0720   06/22/18 2315  ceFAZolin (ANCEF) IVPB 2g/100 mL premix     2 g 200 mL/hr  over 30 Minutes Intravenous Every 8 hours 06/22/18 2314 06/27/18 1519   06/22/18 1828  bacitracin 50,000 Units in sodium chloride 0.9 % 500 mL irrigation  Status:  Discontinued       As needed 06/22/18 1828 06/22/18 1915   06/22/18 1700  ceFAZolin (ANCEF) 3 g in dextrose 5 % 50 mL IVPB  Status:  Discontinued     3 g 100 mL/hr over 30 Minutes Intravenous  Once 06/22/18 1647 06/22/18 2101   06/14/18 2100  ceFAZolin (ANCEF) IVPB 2g/100 mL premix     2 g 200 mL/hr over 30 Minutes Intravenous Every 8 hours 06/14/18 1712 06/17/18 0800   06/14/18 1414  bacitracin 50,000 Units in  sodium chloride 0.9 % 500 mL irrigation  Status:  Discontinued       As needed 06/14/18 1414 06/14/18 1647   06/14/18 1215  ceFAZolin (ANCEF) 3 g in dextrose 5 % 50 mL IVPB     3 g 160 mL/hr over 30 Minutes Intravenous To [facility] 06/14/18 1202 06/14/18 2225   06/14/18 1200  ceFAZolin (ANCEF) IVPB 2g/100 mL premix  Status:  Discontinued     2 g 200 mL/hr over 30 Minutes Intravenous On call to O.R. 06/14/18 1157 06/14/18 2140       Objective: Vitals:   09/05/18 2300 09/06/18 0300 09/06/18 0500 09/06/18 0700  BP: 105/67 103/64  107/76  Pulse: 84 77    Resp:    20  Temp: 97.8 F (36.6 C) 98.1 F (36.7 C)  98.1 F (36.7 C)  TempSrc: Oral Oral  Oral  SpO2: 98% 98%  98%  Weight:   122.4 kg   Height:        Intake/Output Summary (Last 24 hours) at 09/06/2018 1230 Last data filed at 09/06/2018 0804 Gross per 24 hour  Intake 480 ml  Output 1800 ml  Net -1320 ml   Filed Weights   09/02/18 0600 09/05/18 0500 09/06/18 0500  Weight: 124.4 kg 122.4 kg 122.4 kg      Examination: General exam: Appears calm and comfortable  Respiratory system: Clear to auscultation. Respiratory effort normal. Cardiovascular system: S1 & S2 heard, RRR. No JVD, murmurs, rubs, gallops or clicks. No pedal edema. Gastrointestinal system: Abdomen is nondistended, soft and nontender. No organomegaly or masses felt. Normal bowel sounds heard. Central nervous system: Alert and oriented Extremities: Symmetric  Skin: No rashes, lesions or ulcers on exposed skin  Psychiatry: Judgement and insight appear stable. Mood & affect appropriate.     Data Reviewed: I have personally reviewed following labs and imaging studies  CBC: Recent Labs  Lab 08/31/18 0433 09/01/18 0447 09/02/18 0339 09/03/18 0519 09/04/18 0124  WBC 15.5* 21.3* 22.3* 16.2* 17.7*  HGB 10.4* 10.1* 10.9* 10.7* 11.1*  HCT 33.2* 32.5* 34.3* 34.7* 34.5*  MCV 95.1 94.2 95.0 94.6 94.3  PLT 174 151 185 161 773   Basic Metabolic  Panel: Recent Labs  Lab 08/31/18 0433 09/01/18 0447 09/02/18 0339 09/04/18 0124  NA 138 140 140 142  K 4.5 5.0 4.5 4.3  CL 95* 97* 96* 97*  CO2 _0 GLUCOSE 175* 162* 192* 144*  BUN 32* 47* 59* 56*  CREATININE 0.79 0.85 1.00 0.64  CALCIUM 10.0 10.3 9.9 9.6   GFR: Estimated Creatinine Clearance: 138.1 mL/min (by C-G formula based on SCr of 0.64 mg/dL). Liver Function Tests: No results for input(s): AST, ALT, ALKPHOS, BILITOT, PROT, ALBUMIN in the last 168 hours. No results for input(s): LIPASE, AMYLASE  in the last 168 hours. No results for input(s): AMMONIA in the last 168 hours. Coagulation Profile: No results for input(s): INR, PROTIME in the last 168 hours. Cardiac Enzymes: No results for input(s): CKTOTAL, CKMB, CKMBINDEX, TROPONINI in the last 168 hours. BNP (last 3 results) No results for input(s): PROBNP in the last 8760 hours. HbA1C: No results for input(s): HGBA1C in the last 72 hours. CBG: Recent Labs  Lab 09/05/18 0741 09/05/18 1221 09/05/18 1659 09/05/18 2115 09/06/18 0730  GLUCAP 80 104* 129* 191* 130*   Lipid Profile: No results for input(s): CHOL, HDL, LDLCALC, TRIG, CHOLHDL, LDLDIRECT in the last 72 hours. Thyroid Function Tests: No results for input(s): TSH, T4TOTAL, FREET4, T3FREE, THYROIDAB in the last 72 hours. Anemia Panel: No results for input(s): VITAMINB12, FOLATE, FERRITIN, TIBC, IRON, RETICCTPCT in the last 72 hours. Sepsis Labs: No results for input(s): PROCALCITON, LATICACIDVEN in the last 168 hours.  Recent Results (from the past 240 hour(s))  Culture, blood (Routine X 2) w Reflex to ID Panel     Status: None   Collection Time: 08/28/18  7:28 AM  Result Value Ref Range Status   Specimen Description BLOOD LEFT HAND  Final   Special Requests   Final    BOTTLES DRAWN AEROBIC ONLY Blood Culture adequate volume   Culture   Final    NO GROWTH 5 DAYS Performed at [facility], [address], [place], [place] [address]     Report Status 09/02/2018 FINAL  Final  Culture, blood (Routine X 2) w Reflex to ID Panel     Status: None   Collection Time: 08/28/18  7:35 AM  Result Value Ref Range Status   Specimen Description BLOOD RIGHT HAND  Final   Special Requests   Final    BOTTLES DRAWN AEROBIC AND ANAEROBIC Blood Culture adequate volume   Culture   Final    NO GROWTH 5 DAYS Performed at [facility], [place] [address], [place], [place] [address]    Report Status 09/02/2018 FINAL  Final       Radiology Studies: No results found.    Scheduled Meds: . allopurinol  100 mg Oral Daily  . apixaban  5 mg Oral BID  . baclofen  10 mg Oral BID  . bismuth subsalicylate  30 mL Oral TID AC & HS  . clonazePAM  0.5 mg Oral BID  . colchicine  0.6 mg Oral BID  . collagenase   Topical Daily  . famotidine  20 mg Oral Daily  . feeding supplement (ENSURE ENLIVE)  237 mL Oral TID BM  . furosemide  40 mg Oral BID  . gabapentin  600 mg Oral TID  . indomethacin  25 mg Oral BID WC  . insulin aspart  0-20 Units Subcutaneous TID WC  . insulin aspart  0-5 Units Subcutaneous QHS  . insulin glargine  15 Units Subcutaneous QHS  . magic mouthwash w/lidocaine  5 mL Oral TID  . mouth rinse  15 mL Mouth Rinse BID  . multivitamin with minerals  1 tablet Oral Daily  . omeprazole  20 mg Oral BID AC  . potassium chloride  40 mEq Oral Daily  . predniSONE  50 mg Oral Q breakfast  . sodium chloride flush  10-40 mL Intracatheter Q12H   Continuous Infusions: . sodium chloride 10 mL/hr at 08/15/18 0200     LOS: 91 days    Time spent: 20 minutes   Dessa Phi, DO [facility] [email or web address]  Password [email or web address] 09/06/2018, 12:30 PM

## 2018-09-07 LAB — CBC
HCT: 35.9 % — ABNORMAL LOW (ref 39.0–52.0)
Hemoglobin: 11.2 g/dL — ABNORMAL LOW (ref 13.0–17.0)
MCH: 29 pg (ref 26.0–34.0)
MCHC: 31.2 g/dL (ref 30.0–36.0)
MCV: 93 fL (ref 80.0–100.0)
NRBC: 0 % (ref 0.0–0.2)
Platelets: 190 10*3/uL (ref 150–400)
RBC: 3.86 MIL/uL — ABNORMAL LOW (ref 4.22–5.81)
RDW: 16.5 % — ABNORMAL HIGH (ref 11.5–15.5)
WBC: 15.9 10*3/uL — ABNORMAL HIGH (ref 4.0–10.5)

## 2018-09-07 LAB — GLUCOSE, CAPILLARY
Glucose-Capillary: 149 mg/dL — ABNORMAL HIGH (ref 70–99)
Glucose-Capillary: 70 mg/dL (ref 70–99)
Glucose-Capillary: 108 mg/dL — ABNORMAL HIGH (ref 70–99)
Glucose-Capillary: 149 mg/dL — ABNORMAL HIGH (ref 70–99)
Glucose-Capillary: 131 mg/dL — ABNORMAL HIGH (ref 70–99)

## 2018-09-07 NOTE — Progress Notes (Signed)
PROGRESS NOTE    [name] [name]  OJJ:[id number] DOB: 06-Aug-1966 DOA: 06/06/2018 PCP: Helane Rima, MD     Brief Narrative:  [name] [name] is a 52 y.o.year old malewith medical history significant for gout, hypertension, underwent anterior cervical corpectomy of C4 on 9/37/1696 with complicated postoperative course including development of multiple PEs and DVTs started on anticoagulation and subsequently developed spinal hematoma requiring reexploration on 03/28/2018 status post placement of IVC filter then discharged to SNF who presented on 9/24/2019as a transfer from [facility] with cervical spine MRI showing changes consistent with cord compression, cord edema and concern for ischemia. His hospital course has been complicated by cervical decompression laminectomy, postoperative hematomas and multiple evacuations, new bilateral lower extremity DVTs, epidural hematoma (78/93, 81/01) complicating anticoagulation therapy (briefly on argatroban and discontinued given recurrent hematomas), new PE now stable on eliquis, bone marrow biopsy for evaluation of thrombocytopenia, platelet and endplate transfusions.Cervical spine 08/19/18 MRI which neurosurgery evaluated and deemed it looked stable and recommended no further work-up. Patient has been stable throughout this week until spike a fever to 102 overnight on 12/15 followed by another fever overnight on 12/16. In both settings patient was noted to be somewhat altered with symptoms of disorientation but no new neurologic deficits. We will work-up is included negative chest x-ray, no acute abnormalities on CT abdomen, no repeat or worsening DVTs on lower extremity venous duplex and no growth on blood cultures x2 obtain on 12/16. He was treated for gout with steroids with improvement in gout symptoms, has remained afebrile. He was deemed stable for discharge to spinal rehab facility.   New events last 24 hours / Subjective: Nothing new to  report, feeling well overall  Assessment & Plan:   Principal Problem:   Recurrent pulmonary embolism ([place]) Active Problems:   Myelopathy ([place])   Pressure injury of skin   Thrombocytopenia (HCC)   Acute blood loss anemia   Benign essential HTN   Cord compression (HCC)   Fever   Spondylosis, cervical, with myelopathy   Labile blood pressure   Orthostatic hypotension   Tachycardia-bradycardia syndrome (HCC)   Adjustment disorder with mixed anxiety and depressed mood   G6PD deficiency   G6PD deficiency anemia (HCC)   Ascending aortic aneurysm (HCC)   Difficult intravenous access   DVT (deep venous thrombosis) (HCC)   SIRS (systemic inflammatory response syndrome) (HCC)   Bilateral lower extremity ankle and toe pain concerning for acute gout flare -Elevated ESR and CRP -Treated with steroids with improvement -Continue allopurinol, colchicine, indomethacin    Severe cervical spine stenosis -Stable quadriparesis, right upper extremity stronger than left upper extremity (consistent with prior exams based on chart review by neurosurgery evaluation) status post decompressive surgery complicated by multiple postoperative hematomas and evacuations. Most recent MRI of cervical spine on 12/7 reports chronic sequelae of compressive myelopathy.Caseearlierdiscussed with neurosurgery who deemed slight improvement from prior films since surgery, no further plans for surgery given neurologically stable and very high risk for further bleeding in the cervical area per NSG. Will need aggressive rehabilitation with spinal rehab   Fevers -Most recent fever few days ago, afebrile since. Possibly due to gout, no other clinical evidence of infection. He does have a leukocytosis which is new but this may be related to the steroids. No further fevers noted   Acute pulmonary embolism and DVT -S/p IVC filter July-2019 -Multiple events of postoperative epidural hematoma requiring to hold  anticoagulation. Subsequently had DVT therefore started on bivalirudin 11/13 for submassive PE. Still  had DVTs therefore was switched to ARC Atrovent 11/29 per hematology. -Currently he is back on Eliquis  Bilateral upper and lower extremity edema -Likely from hypoalbuminemia. No evidence of CHF. He needs to be moved around more often and needs to get to a rehab. Resume home oral Lasix therapy. Input and output, daily weight  Incidental finding of pneumoperitoneum 11/23 -This is resolved.  Thrombocytopenia -Resolved. Had bone marrow biopsy requiring initiation of platelet transfusion. 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  Essential hypertension -Continue to monitor. BP stable.   Pressure ulcer on right initial tuberosity, stage II -Local wound care  Severe osteoarthritis of the left knee -Pain control  Peripheral neuropathy -Gabapentin 600 mg 3 times daily  DVT prophylaxis: Eliquis Code Status: Full Family Communication: No family at bedside  Disposition Plan: Pending discharge to Spine Rehab in [place], hopefully 12/30     Antimicrobials:  Anti-infectives (From admission, onward)   Start     Dose/Rate Route Frequency Ordered Stop   08/13/18 1000  ceFEPIme (MAXIPIME) 1 g in sodium chloride 0.9 % 100 mL IVPB     1 g 200 mL/hr over 30 Minutes Intravenous Every 8 hours 08/13/18 0949 08/14/18 2259   08/05/18 1415  vancomycin (VANCOCIN) IVPB 1000 mg/200 mL premix  Status:  Discontinued     1,000 mg 200 mL/hr over 60 Minutes Intravenous Every 8 hours 08/05/18 1410 08/08/18 1257   08/05/18 1415  ceFEPIme (MAXIPIME) 1 g in sodium chloride 0.9 % 100 mL IVPB     1 g 200 mL/hr over 30 Minutes Intravenous Every 8 hours 08/05/18 1410 08/12/18 0544   08/04/18 0645  levofloxacin (LEVAQUIN) IVPB 500 mg  Status:  Discontinued     500 mg 100 mL/hr over 60 Minutes Intravenous Every 24 hours 08/04/18 0642 08/05/18 1316   07/25/18 1700  fluconazole (DIFLUCAN)  IVPB 400 mg  Status:  Discontinued     400 mg 100 mL/hr over 120 Minutes Intravenous Every 24 hours 07/25/18 1633 07/27/18 1415   07/12/18 2100  ceFAZolin (ANCEF) IVPB 2g/100 mL premix  Status:  Discontinued     2 g 200 mL/hr over 30 Minutes Intravenous Every 8 hours 07/12/18 1555 07/15/18 1050   07/10/18 2229  bacitracin 50,000 Units in sodium chloride 0.9 % 500 mL irrigation  Status:  Discontinued       As needed 07/10/18 2230 07/10/18 2300   07/07/18 1133  bacitracin 50,000 Units in sodium chloride 0.9 % 500 mL irrigation  Status:  Discontinued       As needed 07/07/18 1133 07/07/18 1357   07/06/18 0900  ceFAZolin (ANCEF) IVPB 2g/100 mL premix  Status:  Discontinued     2 g 200 mL/hr over 30 Minutes Intravenous Every 8 hours 07/06/18 0810 07/12/18 1555   07/01/18 2240  bacitracin 50,000 Units in sodium chloride 0.9 % 500 mL irrigation  Status:  Discontinued       As needed 07/01/18 2240 07/01/18 2324   06/30/18 1330  doxycycline (VIBRA-TABS) tablet 100 mg  Status:  Discontinued     100 mg Oral Every 12 hours 06/30/18 1329 07/12/18 1429   06/27/18 1000  fluconazole (DIFLUCAN) tablet 200 mg  Status:  Discontinued     200 mg Oral Daily 06/27/18 0725 07/04/18 0720   06/22/18 2315  ceFAZolin (ANCEF) IVPB 2g/100 mL premix     2 g 200 mL/hr over 30 Minutes Intravenous Every 8 hours 06/22/18 2314 06/27/18 1519   06/22/18 1828  bacitracin  50,000 Units in sodium chloride 0.9 % 500 mL irrigation  Status:  Discontinued       As needed 06/22/18 1828 06/22/18 1915   06/22/18 1700  ceFAZolin (ANCEF) 3 g in dextrose 5 % 50 mL IVPB  Status:  Discontinued     3 g 100 mL/hr over 30 Minutes Intravenous  Once 06/22/18 1647 06/22/18 2101   06/14/18 2100  ceFAZolin (ANCEF) IVPB 2g/100 mL premix     2 g 200 mL/hr over 30 Minutes Intravenous Every 8 hours 06/14/18 1712 06/17/18 0800   06/14/18 1414  bacitracin 50,000 Units in sodium chloride 0.9 % 500 mL irrigation  Status:  Discontinued       As needed  06/14/18 1414 06/14/18 1647   06/14/18 1215  ceFAZolin (ANCEF) 3 g in dextrose 5 % 50 mL IVPB     3 g 160 mL/hr over 30 Minutes Intravenous To [facility] 06/14/18 1202 06/14/18 2225   06/14/18 1200  ceFAZolin (ANCEF) IVPB 2g/100 mL premix  Status:  Discontinued     2 g 200 mL/hr over 30 Minutes Intravenous On call to O.R. 06/14/18 1157 06/14/18 2140       Objective: Vitals:   09/07/18 0300 09/07/18 0500 09/07/18 0850 09/07/18 1132  BP: (!) 129/98  117/73 112/79  Pulse: 85  73 75  Resp: 18   20  Temp: 97.6 F (36.4 C)  (!) 97.5 F (36.4 C) 97.8 F (36.6 C)  TempSrc: Oral  Oral Oral  SpO2: 97%  100% 100%  Weight:  122.6 kg    Height:        Intake/Output Summary (Last 24 hours) at 09/07/2018 1250 Last data filed at 09/07/2018 0300 Gross per 24 hour  Intake 480 ml  Output 1551 ml  Net -1071 ml   Filed Weights   09/05/18 0500 09/06/18 0500 09/07/18 0500  Weight: 122.4 kg 122.4 kg 122.6 kg     Examination: General exam: Appears calm and comfortable  Respiratory system: Clear to auscultation. Respiratory effort normal. Cardiovascular system: S1 & S2 heard, RRR. No JVD, murmurs, rubs, gallops or clicks. No pedal edema. Gastrointestinal system: Abdomen is nondistended, soft and nontender. No organomegaly or masses felt. Normal bowel sounds heard. Central nervous system: Alert and oriented Extremities: Symmetric  Skin: No rashes, lesions or ulcers Psychiatry: Judgement and insight appear normal. Mood & affect appropriate.     Data Reviewed: I have personally reviewed following labs and imaging studies  CBC: Recent Labs  Lab 09/01/18 0447 09/02/18 0339 09/03/18 0519 09/04/18 0124 09/07/18 0352  WBC 21.3* 22.3* 16.2* 17.7* 15.9*  HGB 10.1* 10.9* 10.7* 11.1* 11.2*  HCT 32.5* 34.3* 34.7* 34.5* 35.9*  MCV 94.2 95.0 94.6 94.3 93.0  PLT 151 185 161 166 428   Basic Metabolic Panel: Recent Labs  Lab 09/01/18 0447 09/02/18 0339 09/04/18 0124  NA 140 140  142  K 5.0 4.5 4.3  CL 97* 96* 97*  CO2 '29 28 28  ' GLUCOSE 162* 192* 144*  BUN 47* 59* 56*  CREATININE 0.85 1.00 0.64  CALCIUM 10.3 9.9 9.6   GFR: Estimated Creatinine Clearance: 138.2 mL/min (by C-G formula based on SCr of 0.64 mg/dL). Liver Function Tests: No results for input(s): AST, ALT, ALKPHOS, BILITOT, PROT, ALBUMIN in the last 168 hours. No results for input(s): LIPASE, AMYLASE in the last 168 hours. No results for input(s): AMMONIA in the last 168 hours. Coagulation Profile: No results for input(s): INR, PROTIME in the last 168 hours. Cardiac Enzymes:  No results for input(s): CKTOTAL, CKMB, CKMBINDEX, TROPONINI in the last 168 hours. BNP (last 3 results) No results for input(s): PROBNP in the last 8760 hours. HbA1C: No results for input(s): HGBA1C in the last 72 hours. CBG: Recent Labs  Lab 09/06/18 1148 09/06/18 1551 09/06/18 2128 09/07/18 0855 09/07/18 1215  GLUCAP 149* 175* 197* 70 108*   Lipid Profile: No results for input(s): CHOL, HDL, LDLCALC, TRIG, CHOLHDL, LDLDIRECT in the last 72 hours. Thyroid Function Tests: No results for input(s): TSH, T4TOTAL, FREET4, T3FREE, THYROIDAB in the last 72 hours. Anemia Panel: No results for input(s): VITAMINB12, FOLATE, FERRITIN, TIBC, IRON, RETICCTPCT in the last 72 hours. Sepsis Labs: No results for input(s): PROCALCITON, LATICACIDVEN in the last 168 hours.  No results found for this or any previous visit (from the past 240 hour(s)).     Radiology Studies: No results found.    Scheduled Meds: . allopurinol  100 mg Oral Daily  . apixaban  5 mg Oral BID  . baclofen  10 mg Oral BID  . bismuth subsalicylate  30 mL Oral TID AC & HS  . clonazePAM  0.5 mg Oral BID  . colchicine  0.6 mg Oral BID  . collagenase   Topical Daily  . famotidine  20 mg Oral Daily  . feeding supplement (ENSURE ENLIVE)  237 mL Oral TID BM  . furosemide  40 mg Oral BID  . gabapentin  600 mg Oral TID  . indomethacin  25 mg Oral BID WC   . insulin aspart  0-20 Units Subcutaneous TID WC  . insulin aspart  0-5 Units Subcutaneous QHS  . insulin glargine  15 Units Subcutaneous QHS  . magic mouthwash w/lidocaine  5 mL Oral TID  . mouth rinse  15 mL Mouth Rinse BID  . multivitamin with minerals  1 tablet Oral Daily  . omeprazole  20 mg Oral BID AC  . potassium chloride  40 mEq Oral Daily  . sodium chloride flush  10-40 mL Intracatheter Q12H   Continuous Infusions: . sodium chloride 10 mL/hr at 08/15/18 0200     LOS: 92 days    Time spent: 20 minutes   Dessa Phi, DO [facility] [email or web address] Password [facility] 09/07/2018, 12:50 PM

## 2018-09-07 NOTE — Progress Notes (Signed)
Nutrition Follow-up  DOCUMENTATION CODES:   Morbid obesity  INTERVENTION:  Continue Ensure Enlive po TID, each supplement provides 350 kcal and 20 grams of protein.  Encourage adequate PO intake.   NUTRITION DIAGNOSIS:   Increased nutrient needs related to wound healing, post-op healing as evidenced by estimated needs; ongoing  GOAL:   Patient will meet greater than or equal to 90% of their needs; progresing  MONITOR:   PO intake, Supplement acceptance, Labs, Weight trends, Skin, I & O's  REASON FOR ASSESSMENT:   Low [place]    ASSESSMENT:   [name] [name] is a 52 y.o. male with history of hypertension who has had a recent ACDF at C3 C5 by Dr. Wynetta Emeryram in July 2019 complicated by pulmonary embolism was placed on apixaban subsequently discharged to rehab and patient had moved to [place] area to [place] and has been living with his parents.  Had gone to ER around August 27 for complaints of increasing weakness and at the time was found to be febrile and was admitted for possible pneumonia.  He started antibiotic as per the ER physician with whom I discussed.  Patient was eventually discharged to rehab and patient was discharged from rehab last week.  [facility] for his known DVT and on reaching back home in [place] patient on trying to get out of the car started finding it difficult to walk and his legs gave away.  He also states he inadvertently turned his neck.  Following which patient has been having increasing weakness difficult to walk.  Prior to which patient was able to walk up the stairs.  Denies any incontinence of urine or bowel.  Neck pain has been chronic.  Denies any chest pain or shortness of breath.  Meal completion has been 50-75%. Pt reports hospital meal tray has been causing diarrhea. Pt currently has Ensure ordered and has been consuming them. RD to continue with current orders to aid in caloric and protein  needs as well as in wound healing. [facility]. Recommend continuation of nutritional supplements post discharge to aid in healing. Labs and medications reviewed.   Diet Order:   Diet Order            Diet general        Diet regular Room service appropriate? Yes; Fluid consistency: Thin  Diet effective now              EDUCATION NEEDS:   Education needs have been addressed  Skin:  Skin Assessment: Skin Integrity Issues: Skin Integrity Issues:: Stage II, Stage III, Incisions Stage II: R ischial tuberosity, sacrum Stage III: L ischial Incisions: N/A  Last BM:  12/26  Height:   Ht Readings from Last 1 Encounters:  06/22/18 5\' 9"  (1.753 m)    Weight:   Wt Readings from Last 1 Encounters:  09/07/18 122.6 kg    Ideal Body Weight:  72.7 kg  BMI:  Body mass index is 39.91 kg/m.  Estimated Nutritional Needs:   Kcal:  2100-2300  Protein:  145-160 grams  Fluid:  > 2.1 L    Roslyn SmilingStephanie Aneliz Carbary, MS, RD, LDN Pager # [phone] After hours/ weekend pager # [phone]

## 2018-09-07 NOTE — Social Work (Addendum)
11:34am- [facility]era [facility], provided pt height, weight, hospital address and room number, as well as RN nurses station #. [facility] transport will be scheduled to arrive on Monday at 8am. We will need discharge summary updated stating date of discharge will be 12/30 on 12/29. Will leave hand off for CSW weekend coverage.  8:24am- CSW continuing to follow for support with disposition; [facility].  Octavio GravesIsabel Lazlo Tunney, MSW, [facility] [place] Clinical Social Work [phone]

## 2018-09-07 NOTE — Progress Notes (Signed)
Occupational Therapy Treatment Patient Details Name: [name] [name] MRN: [id number] DOB: 1966-04-06 Today's Date: 09/07/2018    History of present illness 52 y.o. male admitted on 06/06/18 for bil LE weakness and muscle spasm after multiple falls (with resultant R knee meniscus tear placed in bledsoe brace) following a recent ACDF at C3 in July 2019 (complicated by PE (s/p IVC filter), CIR, and then SNF placement before finally returning home with his parents).  In the ED MRI of c-spine which showed post operative changes and cord edema and possibility of ischemia.  His R knee was aspirated on 06/09/18.  S/p posterior cervical decompression and fusion on 06/14/18. Entire acute course complicated by hypotension in standing (thought to be autonomic) and by finding of multiple LE DVTs (already has an IVC filter in place, but per MDs notes at risk for blocking the filter with these clots).  Pt with post op issues with incisional bleeding s/p 2 evacuations of hematomas and placement of hemovac (06/22/18 and 07/01/18) as well as multiple units of plasma.  Cardiology consulted 07/05/18 due to bradycardia thought to be from his newly started blood thinner.  Pt with another re-accumulation of epidural hematoma with increased L sided weakness and cord compression with emergent evacuation surgery 10/25 & 07/10/18.  Pt also needing more platelets post op.  Bone marrow bx 07/12/18.  Pt found to have submassive PE on 07/27/18 and re-started on anticoagulation. PMHx of HTN, gout, anemia, DDD (lumbar).  08/10/18 found to have a L UE thrombus (same arm as his PICC line).     OT comments  Pt pleasant and willing to participate in therapy session today. Continued focus on UE/LE ROM, stretching and strengthening. Integrated light resistance into UE movements to promote further strengthening as appropriate throughout. Pt continues to demonstrate small improvements in ROM of LUE and RUE. Issued resistance/squeeze ball and  educated on uses to further strengthen digits of bil hands. Pt motivated and ready for transition to next venue of care for continued rehab. Will continue to follow while he remains in acute setting.     Follow Up Recommendations  SNF;Supervision/Assistance - 24 hour    Equipment Recommendations  Other (comment)(TBD in next venue)    Recommendations for Other Services      Precautions / Restrictions Precautions Precautions: Fall;Cervical;Other (comment) Precaution Comments: Several bed sores on bottom.  left ischium is worse than R ischium Required Braces or Orthoses: Cervical Brace Cervical Brace: Soft collar(PRN) Other Brace: bil PRAFOs Restrictions Weight Bearing Restrictions: No       Mobility Bed Mobility Overal bed mobility: Needs Assistance             General bed mobility comments: maxA for repositioning UEs/LEs in bed end of session  Transfers                      Balance                                           ADL either performed or assessed with clinical judgement   ADL Overall ADL's : Needs assistance/impaired Eating/Feeding: Set up;Bed level;Sitting Eating/Feeding Details (indicate cue type and reason): pt drinking smoothie during session, setup assist to place drink close to R hand, pt able to bring cup to mouth himself without difficulty  General ADL Comments: continued focus on UB/LB strengtheing, stretching, A/AA/PROM     Vision       Perception     Praxis      Cognition Arousal/Alertness: Awake/alert Behavior During Therapy: WFL for tasks assessed/performed Overall Cognitive Status: Within Functional Limits for tasks assessed                                 General Comments: pt appropriate today, though does have some decreased insight into current status/prognosis as pt brought up continued wishes/goal to walk again after getting to spine  center.         Exercises Exercises: General Lower Extremity;General Upper Extremity;Other exercises General Exercises - Upper Extremity Shoulder Flexion: PROM;Left;AAROM;Right;10 reps Shoulder Horizontal ABduction: PROM;Left;Supine;AAROM;Right;10 reps;AROM Shoulder Horizontal ADduction: PROM;Left;Supine;AAROM;Right;10 reps;AROM Elbow Flexion: PROM;Supine;AROM;10 reps;AAROM;Both Elbow Extension: PROM;AAROM;Left;10 reps;Supine;AROM Wrist Flexion: PROM;AROM;AAROM;Both;10 reps Wrist Extension: PROM;AROM;AAROM;Both;10 reps Digit Composite Flexion: AROM;AAROM;Both;10 reps Composite Extension: AROM;AAROM;Both;10 reps General Exercises - Lower Extremity Ankle Circles/Pumps: PROM;Both;10 reps Other Exercises Other Exercises: Hip IR bil LEs, increased focus on RLE as RLE tending to want to rest in ER more than LLE today Other Exercises: issued pt squeeze ball for additional digit strengthening in bil hands; provided light resistance to bil UEs with certain AAROM to increase challenge to patient   Shoulder Instructions       General Comments      Pertinent Vitals/ Pain       Pain Assessment: Faces Faces Pain Scale: Hurts a little bit Pain Location: generalized with certain movements/stretching Pain Descriptors / Indicators: Sore Pain Intervention(s): Monitored during session;Repositioned  Home Living                                          Prior Functioning/Environment              Frequency  Min 2X/week        Progress Toward Goals  OT Goals(current goals can now be found in the care plan section)     Acute Rehab OT Goals Patient Stated Goal: to get started with his daily intensive therapy at the Spine center in [place] OT Goal Formulation: With patient Time For Goal Achievement: 09/11/18 Potential to Achieve Goals: Fair  Plan Discharge plan remains appropriate    Co-evaluation                 AM-PAC OT "6 Clicks" Daily Activity      Outcome Measure   Help from another person eating meals?: A Little Help from another person taking care of personal grooming?: A Little Help from another person toileting, which includes using toliet, bedpan, or urinal?: Total Help from another person bathing (including washing, rinsing, drying)?: A Lot Help from another person to put on and taking off regular upper body clothing?: Total Help from another person to put on and taking off regular lower body clothing?: Total 6 Click Score: 11    End of Session Equipment Utilized During Treatment: Cervical collar  OT Visit Diagnosis: Other abnormalities of gait and mobility (R26.89);Muscle weakness (generalized) (M62.81)   Activity Tolerance Patient tolerated treatment well   Patient Left in bed;with call bell/phone within reach;with family/visitor present   Nurse Communication Mobility status        Time: 5366-4403 OT Time Calculation (min): 41 min  Charges: OT General  Charges $OT Visit: 1 Visit OT Treatments $Therapeutic Activity: 38-52 mins  Marcy SirenBreanna Tinsley Everman, OT [facility] Pager [phone]   Orlando PennerBreanna L Cyndee Giammarco 09/07/2018, 3:48 PM

## 2018-09-07 NOTE — Plan of Care (Signed)

## 2018-09-08 LAB — GLUCOSE, CAPILLARY
Glucose-Capillary: 99 mg/dL (ref 70–99)
Glucose-Capillary: 93 mg/dL (ref 70–99)
Glucose-Capillary: 101 mg/dL — ABNORMAL HIGH (ref 70–99)
Glucose-Capillary: 103 mg/dL — ABNORMAL HIGH (ref 70–99)

## 2018-09-08 NOTE — Progress Notes (Signed)
Physical Therapy Treatment Patient Details Name: [name] [name] MRN: [id number] DOB: 04-22-1966 Today's Date: 09/08/2018    History of Present Illness 52 y.o. male admitted on 06/06/18 for bil LE weakness and muscle spasm after multiple falls (with resultant R knee meniscus tear placed in bledsoe brace) following a recent ACDF at [facility] in July 2019 (complicated by PE (s/p IVC filter), CIR, and then SNF placement before finally returning home with his parents).  In the ED MRI of c-spine which showed post operative changes and cord edema and possibility of ischemia.  His R knee was aspirated on 06/09/18.  S/p posterior cervical decompression and fusion on 06/14/18. Entire acute course complicated by hypotension in standing (thought to be autonomic) and by finding of multiple LE DVTs (already has an IVC filter in place, but per MDs notes at risk for blocking the filter with these clots).  Pt with post op issues with incisional bleeding s/p 2 evacuations of hematomas and placement of hemovac (06/22/18 and 07/01/18) as well as multiple units of plasma.  Cardiology consulted 07/05/18 due to bradycardia thought to be from his newly started blood thinner.  Pt with another re-accumulation of epidural hematoma with increased L sided weakness and cord compression with emergent evacuation surgery 10/25 & 07/10/18.  Pt also needing more platelets post op.  Bone marrow bx 07/12/18.  Pt found to have submassive PE on 07/27/18 and re-started on anticoagulation. PMHx of HTN, gout, anemia, DDD (lumbar).  08/10/18 found to have a L UE thrombus (same arm as his PICC line).      PT Comments    Patient progressing this session, able to work on sitting posture, stretches and balance but limited due to hips sliding close to EOB after mattress inflated due to surface too compliant.  Excited for him to transfer where he can get the benefit of more intensive therapies with appropriate equipment and getting out of the room.  He is  motivated.  Will follow up if not d/c.    Follow Up Recommendations  SNF     Equipment Recommendations  Wheelchair (measurements PT);Wheelchair cushion (measurements PT);Other (comment);Hospital bed(20x20 TIS, power recline and hoyer)    Recommendations for Other Services       Precautions / Restrictions Precautions Precautions: Fall;Cervical;Other (comment) Precaution Comments: Several bed sores on bottom.  left ischium is worse than R ischium Required Braces or Orthoses: Cervical Brace Knee Immobilizer - Right: On when out of bed or walking Cervical Brace: Soft collar Other Brace: bil PRAFOs    Mobility  Bed Mobility Overal bed mobility: Needs Assistance Bed Mobility: Rolling;Sidelying to Sit;Sit to Supine Rolling: +2 for physical assistance;Max assist Sidelying to sit: +2 for physical assistance;Total assist   Sit to supine: +2 for physical assistance;Total assist   General bed mobility comments: able to assist with head and some with UE for rolling, assist for all aspects of side to sit and sit to supine (pt sliding off EOB so could not go through sidelying)  Transfers                    Ambulation/Gait                 Stairs             Wheelchair Mobility    Modified Rankin (Stroke Patients Only)       Balance Overall balance assessment: Needs assistance Sitting-balance support: Feet supported;Bilateral upper extremity supported Sitting balance-Leahy Scale: Zero Sitting balance - Comments:  sat EOB about 6-7 minutes working on stretching up tall with gait belt behind back and assist for upper body extension, pt able to assist with head righting with cues, limited due to hips sliding off EOB after maxinflating matress due to too soft                                    Cognition Arousal/Alertness: Awake/alert Behavior During Therapy: WFL for tasks assessed/performed Overall Cognitive Status: Within Functional Limits for  tasks assessed                                        Exercises Other Exercises Other Exercises: PROM Bilateral LE's with stretch into IR on R, bilat hip flexion and ankle DF/PF    General Comments        Pertinent Vitals/Pain Faces Pain Scale: Hurts little more Pain Location: L great toe (gout) Pain Descriptors / Indicators: Sharp;Stabbing Pain Intervention(s): Monitored during session;Repositioned    Home Living                      Prior Function            PT Goals (current goals can now be found in the care plan section) Progress towards PT goals: Progressing toward goals(slowly)    Frequency    Min 2X/week      PT Plan Current plan remains appropriate    Co-evaluation              AM-PAC PT "6 Clicks" Mobility   Outcome Measure  Help needed turning from your back to your side while in a flat bed without using bedrails?: Total Help needed moving from lying on your back to sitting on the side of a flat bed without using bedrails?: Total Help needed moving to and from a bed to a chair (including a wheelchair)?: Total Help needed standing up from a chair using your arms (e.g., wheelchair or bedside chair)?: Total Help needed to walk in hospital room?: Total Help needed climbing 3-5 steps with a railing? : Total 6 Click Score: 6    End of Session Equipment Utilized During Treatment: Gait belt;Cervical collar Activity Tolerance: Patient tolerated treatment well Patient left: in bed;with call bell/phone within reach   PT Visit Diagnosis: Other abnormalities of gait and mobility (R26.89);Other symptoms and signs involving the nervous system (R29.898);Muscle weakness (generalized) (M62.81)     Time: [id number] PT Time Calculation (min) (ACUTE ONLY): 25 min  Charges:  $Therapeutic Exercise: 8-22 mins $Therapeutic Activity: 8-22 mins                     [name] [name], [place]PT Acute Rehabilitation  Services [phone] 09/08/2018    [name] [name] 09/08/2018, 4:41 PM

## 2018-09-08 NOTE — Progress Notes (Signed)
PROGRESS NOTE    [name] [name]  NOB:[id number] DOB: 09-19-1965 DOA: 06/06/2018 PCP: Helane Rima, MD     Brief Narrative:  [name] [name] is a 52 y.o.year old malewith medical history significant for gout, hypertension, underwent anterior cervical corpectomy of C4 on 9/47/6546 with complicated postoperative course including development of multiple PEs and DVTs started on anticoagulation and subsequently developed spinal hematoma requiring reexploration on 03/28/2018 status post placement of IVC filter then discharged to SNF who presented on 9/24/2019as a transfer from [facility] with cervical spine MRI showing changes consistent with cord compression, cord edema and concern for ischemia. His hospital course has been complicated by cervical decompression laminectomy, postoperative hematomas and multiple evacuations, new bilateral lower extremity DVTs, epidural hematoma (50/35, 46/56) complicating anticoagulation therapy (briefly on argatroban and discontinued given recurrent hematomas), new PE now stable on eliquis, bone marrow biopsy for evaluation of thrombocytopenia, platelet and endplate transfusions.Cervical spine 08/19/18 MRI which neurosurgery evaluated and deemed it looked stable and recommended no further work-up. Patient has been stable throughout this week until spike a fever to 102 overnight on 12/15 followed by another fever overnight on 12/16. In both settings patient was noted to be somewhat altered with symptoms of disorientation but no new neurologic deficits. We will work-up is included negative chest x-ray, no acute abnormalities on CT abdomen, no repeat or worsening DVTs on lower extremity venous duplex and no growth on blood cultures x2 obtain on 12/16. He was treated for gout with steroids with improvement in gout symptoms, has remained afebrile. He was deemed stable for discharge to spinal rehab facility.   New events last 24 hours / Subjective: Stable overnight,  nothing else new  Assessment & Plan:   Principal Problem:   Recurrent pulmonary embolism (HCC) Active Problems:   Myelopathy (HCC)   Pressure injury of skin   Thrombocytopenia (HCC)   Acute blood loss anemia   Benign essential HTN   Cord compression (HCC)   Fever   Spondylosis, cervical, with myelopathy   Labile blood pressure   Orthostatic hypotension   Tachycardia-bradycardia syndrome (HCC)   Adjustment disorder with mixed anxiety and depressed mood   G6PD deficiency   G6PD deficiency anemia (HCC)   Ascending aortic aneurysm (HCC)   Difficult intravenous access   DVT (deep venous thrombosis) (HCC)   SIRS (systemic inflammatory response syndrome) (HCC)   Bilateral lower extremity ankle and toe pain concerning for acute gout flare -Elevated ESR and CRP -Treated with steroids with improvement -Continue allopurinol, colchicine, indomethacin    Severe cervical spine stenosis -Stable quadriparesis, right upper extremity stronger than left upper extremity (consistent with prior exams based on chart review by neurosurgery evaluation) status post decompressive surgery complicated by multiple postoperative hematomas and evacuations. Most recent MRI of cervical spine on 12/7 reports chronic sequelae of compressive myelopathy.Caseearlierdiscussed with neurosurgery who deemed slight improvement from prior films since surgery, no further plans for surgery given neurologically stable and very high risk for further bleeding in the cervical area per NSG. Will need aggressive rehabilitation with spinal rehab   Fevers -Most recent fever few days ago, afebrile since. Possibly due to gout, no other clinical evidence of infection. He does have a leukocytosis which is new but this may be related to the steroids. No further fevers noted   Acute pulmonary embolism and DVT -S/p IVC filter July-2019 -Multiple events of postoperative epidural hematoma requiring to hold anticoagulation.  Subsequently had DVT therefore started on bivalirudin 11/13 for submassive PE. Still had DVTs  therefore was switched to ARC Atrovent 11/29 per hematology. -Currently he is back on Eliquis  Bilateral upper and lower extremity edema -Likely from hypoalbuminemia. No evidence of CHF. He needs to be moved around more often and needs to get to a rehab. Resume home oral Lasix therapy. Input and output, daily weight  Incidental finding of pneumoperitoneum 11/23 -This is resolved.  Thrombocytopenia -Resolved. Had bone marrow biopsy requiring initiation of platelet transfusion. 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  Essential hypertension -Continue to monitor. BP stable.   Pressure ulcer on right initial tuberosity, stage II -Local wound care  Severe osteoarthritis of the left knee -Pain control  Peripheral neuropathy -Gabapentin 600 mg 3 times daily  DVT prophylaxis: Eliquis Code Status: Full Family Communication: No family at bedside  Disposition Plan: Pending discharge to Spine Rehab in [place], hopefully 12/30     Antimicrobials:  Anti-infectives (From admission, onward)   Start     Dose/Rate Route Frequency Ordered Stop   08/13/18 1000  ceFEPIme (MAXIPIME) 1 g in sodium chloride 0.9 % 100 mL IVPB     1 g 200 mL/hr over 30 Minutes Intravenous Every 8 hours 08/13/18 0949 08/14/18 2259   08/05/18 1415  vancomycin (VANCOCIN) IVPB 1000 mg/200 mL premix  Status:  Discontinued     1,000 mg 200 mL/hr over 60 Minutes Intravenous Every 8 hours 08/05/18 1410 08/08/18 1257   08/05/18 1415  ceFEPIme (MAXIPIME) 1 g in sodium chloride 0.9 % 100 mL IVPB     1 g 200 mL/hr over 30 Minutes Intravenous Every 8 hours 08/05/18 1410 08/12/18 0544   08/04/18 0645  levofloxacin (LEVAQUIN) IVPB 500 mg  Status:  Discontinued     500 mg 100 mL/hr over 60 Minutes Intravenous Every 24 hours 08/04/18 0642 08/05/18 1316   07/25/18 1700  fluconazole (DIFLUCAN) IVPB 400 mg   Status:  Discontinued     400 mg 100 mL/hr over 120 Minutes Intravenous Every 24 hours 07/25/18 1633 07/27/18 1415   07/12/18 2100  ceFAZolin (ANCEF) IVPB 2g/100 mL premix  Status:  Discontinued     2 g 200 mL/hr over 30 Minutes Intravenous Every 8 hours 07/12/18 1555 07/15/18 1050   07/10/18 2229  bacitracin 50,000 Units in sodium chloride 0.9 % 500 mL irrigation  Status:  Discontinued       As needed 07/10/18 2230 07/10/18 2300   07/07/18 1133  bacitracin 50,000 Units in sodium chloride 0.9 % 500 mL irrigation  Status:  Discontinued       As needed 07/07/18 1133 07/07/18 1357   07/06/18 0900  ceFAZolin (ANCEF) IVPB 2g/100 mL premix  Status:  Discontinued     2 g 200 mL/hr over 30 Minutes Intravenous Every 8 hours 07/06/18 0810 07/12/18 1555   07/01/18 2240  bacitracin 50,000 Units in sodium chloride 0.9 % 500 mL irrigation  Status:  Discontinued       As needed 07/01/18 2240 07/01/18 2324   06/30/18 1330  doxycycline (VIBRA-TABS) tablet 100 mg  Status:  Discontinued     100 mg Oral Every 12 hours 06/30/18 1329 07/12/18 1429   06/27/18 1000  fluconazole (DIFLUCAN) tablet 200 mg  Status:  Discontinued     200 mg Oral Daily 06/27/18 0725 07/04/18 0720   06/22/18 2315  ceFAZolin (ANCEF) IVPB 2g/100 mL premix     2 g 200 mL/hr over 30 Minutes Intravenous Every 8 hours 06/22/18 2314 06/27/18 1519   06/22/18 1828  bacitracin 50,000 Units  in sodium chloride 0.9 % 500 mL irrigation  Status:  Discontinued       As needed 06/22/18 1828 06/22/18 1915   06/22/18 1700  ceFAZolin (ANCEF) 3 g in dextrose 5 % 50 mL IVPB  Status:  Discontinued     3 g 100 mL/hr over 30 Minutes Intravenous  Once 06/22/18 1647 06/22/18 2101   06/14/18 2100  ceFAZolin (ANCEF) IVPB 2g/100 mL premix     2 g 200 mL/hr over 30 Minutes Intravenous Every 8 hours 06/14/18 1712 06/17/18 0800   06/14/18 1414  bacitracin 50,000 Units in sodium chloride 0.9 % 500 mL irrigation  Status:  Discontinued       As needed 06/14/18 1414  06/14/18 1647   06/14/18 1215  ceFAZolin (ANCEF) 3 g in dextrose 5 % 50 mL IVPB     3 g 160 mL/hr over 30 Minutes Intravenous To [facility] 06/14/18 1202 06/14/18 2225   06/14/18 1200  ceFAZolin (ANCEF) IVPB 2g/100 mL premix  Status:  Discontinued     2 g 200 mL/hr over 30 Minutes Intravenous On call to O.R. 06/14/18 1157 06/14/18 2140       Objective: Vitals:   09/08/18 0000 09/08/18 0400 09/08/18 0729 09/08/18 1104  BP: 135/86 125/82 107/62 (!) 145/77  Pulse:   86 89  Resp:   15 16  Temp: (!) 97.5 F (36.4 C) 98.3 F (36.8 C) (!) 97.4 F (36.3 C) 98.3 F (36.8 C)  TempSrc: Oral Oral Oral Oral  SpO2: 100%  100% 97%  Weight:      Height:        Intake/Output Summary (Last 24 hours) at 09/08/2018 1306 Last data filed at 09/08/2018 1105 Gross per 24 hour  Intake -  Output 1825 ml  Net -1825 ml   Filed Weights   09/05/18 0500 09/06/18 0500 09/07/18 0500  Weight: 122.4 kg 122.4 kg 122.6 kg     Examination: General exam: Appears calm and comfortable  Respiratory system: Clear to auscultation. Respiratory effort normal. Cardiovascular system: S1 & S2 heard, RRR. No JVD, murmurs, rubs, gallops or clicks. No pedal edema. Gastrointestinal system: Abdomen is nondistended, soft and nontender. No organomegaly or masses felt. Normal bowel sounds heard. Central nervous system: Alert and oriented Extremities: Symmetric  Skin: No rashes, lesions or ulcers Psychiatry: Judgement and insight appear normal. Mood & affect appropriate.     Data Reviewed: I have personally reviewed following labs and imaging studies  CBC: Recent Labs  Lab 09/02/18 0339 09/03/18 0519 09/04/18 0124 09/07/18 0352  WBC 22.3* 16.2* 17.7* 15.9*  HGB 10.9* 10.7* 11.1* 11.2*  HCT 34.3* 34.7* 34.5* 35.9*  MCV 95.0 94.6 94.3 93.0  PLT 185 161 166 500   Basic Metabolic Panel: Recent Labs  Lab 09/02/18 0339 09/04/18 0124  NA 140 142  K 4.5 4.3  CL 96* 97*  CO2 28 28  GLUCOSE 192*  144*  BUN 59* 56*  CREATININE 1.00 0.64  CALCIUM 9.9 9.6   GFR: Estimated Creatinine Clearance: 138.2 mL/min (by C-G formula based on SCr of 0.64 mg/dL). Liver Function Tests: No results for input(s): AST, ALT, ALKPHOS, BILITOT, PROT, ALBUMIN in the last 168 hours. No results for input(s): LIPASE, AMYLASE in the last 168 hours. No results for input(s): AMMONIA in the last 168 hours. Coagulation Profile: No results for input(s): INR, PROTIME in the last 168 hours. Cardiac Enzymes: No results for input(s): CKTOTAL, CKMB, CKMBINDEX, TROPONINI in the last 168 hours. BNP (last 3 results)  No results for input(s): PROBNP in the last 8760 hours. HbA1C: No results for input(s): HGBA1C in the last 72 hours. CBG: Recent Labs  Lab 09/07/18 1215 09/07/18 1717 09/07/18 2137 09/08/18 0757 09/08/18 1117  GLUCAP 108* 149* 131* 99 93   Lipid Profile: No results for input(s): CHOL, HDL, LDLCALC, TRIG, CHOLHDL, LDLDIRECT in the last 72 hours. Thyroid Function Tests: No results for input(s): TSH, T4TOTAL, FREET4, T3FREE, THYROIDAB in the last 72 hours. Anemia Panel: No results for input(s): VITAMINB12, FOLATE, FERRITIN, TIBC, IRON, RETICCTPCT in the last 72 hours. Sepsis Labs: No results for input(s): PROCALCITON, LATICACIDVEN in the last 168 hours.  No results found for this or any previous visit (from the past 240 hour(s)).     Radiology Studies: No results found.    Scheduled Meds: . allopurinol  100 mg Oral Daily  . apixaban  5 mg Oral BID  . baclofen  10 mg Oral BID  . bismuth subsalicylate  30 mL Oral TID AC & HS  . clonazePAM  0.5 mg Oral BID  . colchicine  0.6 mg Oral BID  . collagenase   Topical Daily  . famotidine  20 mg Oral Daily  . feeding supplement (ENSURE ENLIVE)  237 mL Oral TID BM  . furosemide  40 mg Oral BID  . gabapentin  600 mg Oral TID  . indomethacin  25 mg Oral BID WC  . insulin aspart  0-20 Units Subcutaneous TID WC  . insulin aspart  0-5 Units  Subcutaneous QHS  . insulin glargine  15 Units Subcutaneous QHS  . magic mouthwash w/lidocaine  5 mL Oral TID  . mouth rinse  15 mL Mouth Rinse BID  . multivitamin with minerals  1 tablet Oral Daily  . omeprazole  20 mg Oral BID AC  . potassium chloride  40 mEq Oral Daily  . sodium chloride flush  10-40 mL Intracatheter Q12H   Continuous Infusions: . sodium chloride 10 mL/hr at 08/15/18 0200     LOS: 93 days    Time spent: 20 minutes   Dessa Phi, DO [facility] [email or web address] Password [email or web address] 09/08/2018, 1:06 PM

## 2018-09-09 LAB — GLUCOSE, CAPILLARY
Glucose-Capillary: 51 mg/dL — ABNORMAL LOW (ref 70–99)
Glucose-Capillary: 123 mg/dL — ABNORMAL HIGH (ref 70–99)
Glucose-Capillary: 157 mg/dL — ABNORMAL HIGH (ref 70–99)
Glucose-Capillary: 140 mg/dL — ABNORMAL HIGH (ref 70–99)

## 2018-09-09 NOTE — Progress Notes (Signed)
[facility]  PROGRESS NOTE  [name] [name] BJS:[id number] DOB: [birth year] DOA: 06/06/2018 PCP: Helane Rima, MD   Brief HPI:   52 year old male with a history of gout, hypertension who underwent anterior cervical corpectomy of C4 on 02/17/3709 with a complicated postoperative course including development of multiple PEs and DVTs, started on anticoagulation and subsequently developed spinal hematoma requiring reexploration on 03/28/2018 status post IVC filter placement discharge to skilled nursing facility.  Patient came back on 06/06/2018 as transfer from [facility] with cervical spine MRI showing changes consistent with cord compression, cord edema and concern for ischemia.  Patient's hospital course has been complicated by cervical decompression, laminectomy, postoperative hematoma, multiple evacuations and new bilateral lower extremity DVTs, epidural hematoma (62/69, 48/54), complicating anticoagulation therapy (briefly undergone treatment and discontinued given recurrent hematomas), new PE now stable on Eliquis, bone marrow biopsy for evaluation of thrombocytopenia, platelet and Nplate transfusion.  Cervical spine MRI on 08/19/2018 which was evaluated by neurosurgery and deemed it looked stable and recommended no further work-up. Patient has been stable until had a fever of 102 on 12/15 which was followed by fever as overnight on 08/28/2018.  On both days patient was found to be somewhat altered with symptoms of disorientation.  No new neurological deficits.  Work-up included negative chest x-ray, no acute abnormality on CT abdomen no repeat or worsening DVTs on lower extremity venous duplex and no growth on blood cultures x2 obtained.  He was treated for gout with steroids with improvement in gout symptoms.  He has remained afebrile.  Patient is stable for discharge to spinal rehab facility.    Subjective   Patient seen and examined, denies any complaints today.    Assessment/Plan:     1. Severe cervical spine stenosis-patient has stable quadriparesis, right upper extremity stronger than left.  He is status post decompressive surgery complicated by multiple postoperative hematomas and questions.  Most recent MRI of cervical spine on 12 7 report chronic sequela of compressive myelopathy.  Case was discussed earlier with neurosurgery who deemed slight improvement from prior films and surgery, no further plans for surgery given drug neurologically stable and very high risk of further bleeding in the cervical spine area per neurosurgery.  Patient will need aggressive rehabilitation with spinal rehab.  2. Acute gout flare-resolved, patient complained of bilateral lower extremity ankle and toe pain concerning for acute gout flare treated with steroids.  Continue allopurinol, colchicine, indomethacin.  3. Fever-resolved, likely from gout flare.  No leukocytosis.  4. Acute pulmonary embolism/DVT-status post IVC filter in July 2019.  Multiple events of postoperative epidural hematoma requiring to hold anticoagulation.  Subsequently DVT therefore started on bivalirudin on 07/26/2018 for submassive PE.  Still had DVTs therefore was switched to argatroban on 08/11/2018.  Currently he is back on Eliquis.  5. Bilateral upper and lower extremity edema-likely from hypoalbuminemia, no evidence of CHF.  Continue furosemide 40 mg twice a day  6. Incidental finding of pneumoperitoneum on 08/05/2018-currently resolved.  7. Thrombocytopenia-resolved, had bone marrow biopsy requiring initiation of platelet transfusion.  Concern for HIT but serotonin release assay was negative  8. Essential hypertension-blood pressure stable.  9. Severe osteoarthritis of left knee-continue pain control  10. Peripheral neuropathy-continue gabapentin  11. Pressure ulcer on right ischial tuberosity stage II-continue local wound care.     CBG: Recent Labs  Lab 09/08/18 1117 09/08/18 1652  09/08/18 2136 09/09/18 0809 09/09/18 1208  GLUCAP 93 101* 103* 51* 123*    CBC: Recent Labs  Lab  09/03/18 0519 09/04/18 0124 09/07/18 0352  WBC 16.2* 17.7* 15.9*  HGB 10.7* 11.1* 11.2*  HCT 34.7* 34.5* 35.9*  MCV 94.6 94.3 93.0  PLT 161 166 703    Basic Metabolic Panel: Recent Labs  Lab 09/04/18 0124  NA 142  K 4.3  CL 97*  CO2 28  GLUCOSE 144*  BUN 56*  CREATININE 0.64  CALCIUM 9.6     DVT prophylaxis: Eliquis  Code Status: Full code  Family Communication: No family at bedside  Disposition Plan: Pending discharge to spine rehab in [place] on 09/11/2018   Consultants:  Neurosurgery  Oncology  Cardiology  General surgery  Procedures:  On 10/2 1. posterior cervical decompressive laminectomy at C3, C4, C5 with removal of the spinous process and complete laminectomy and foraminotomies of the C3, C4, C5 nerve roots. 2. Posterior cervical fusion with lateral mass screws at C3-C4-C5 and pars screws at C2 lies in the globus ellipseLateral mass screw system 3. Posterior lateral arthrodesis C2-C5 utilizing locally harvested autograft mixed with vivigen  06/22/2018:(Preceded by symptoms of worsening left-sided paresthesia weakness, MRI noted postoperative hematoma) Procedure reexploration posterior cervical wound for evacuation of epidural hematoma  06/26/2018 DVT ultrasound:Right: Findings consistent with acute deep vein thrombosis involving the right common femoral vein, right femoral vein, right popliteal vein, right posterior tibial vein, and right peroneal vein. Thrombosis extends proximally into external iliac vein.  Proximal iliac vein not visualized. Left: Findings consistent with acute deep vein thrombosis involving the left common femoral vein, left femoral vein, left popliteal vein, left posterior tibial vein, and left peroneal vein. Findings consistent with acute superficial vein thrombosis  involving the left great saphenous vein. Thrombosis  extends proximally into external iliac vein. Proximal iliac vein not visualized.  07/01/18 (symptoms of decreased left hand grip, MRI showed large epidural hematoma) requiring operative clot evacuation   07/02/18 RUE doppler, no DVT in right upper extremity  10/28/2019Bilateral lower extremity Doppler, acute DVT in right, acute DVTin left common femoral vein, left proximal profunda vein, left femoral vein, left popliteal vein, left posterior tibial vein, and left peroneal vein  TTE, 07/27/2018, EF 50-09%, grade 1 diastolic dysfunction  38/18/2993 bilateral lower extremity Doppler, DVT in bilateral saphenofemoral junctions, common femoral, femoral, popliteal, posterior tibial, and peroneal veins  08/10/2018 Left upper extremity duplex, acute DVTin left subclavian veins, left axillary vein, left brachial vein and left radial veins. Acute superficial vein thrombosis involving the left basilar vein and left cephalic vein.  08/29/2018 bilateral lower extremity duplexAge indeterminate DVTin the right common femoral vein, right femoral vein, right proximal profunda vein, right popliteal vein, right posterior tibial vein, and right peroneal vein. And age-indeterminate left DVT involving the left common femoral vein, left femoral vein, left proximal profunda vein, left popliteal vein, left posterior tibial vein, and left peroneal vein    Antibiotics:   Anti-infectives (From admission, onward)   Start     Dose/Rate Route Frequency Ordered Stop   08/13/18 1000  ceFEPIme (MAXIPIME) 1 g in sodium chloride 0.9 % 100 mL IVPB     1 g 200 mL/hr over 30 Minutes Intravenous Every 8 hours 08/13/18 0949 08/14/18 2259   08/05/18 1415  vancomycin (VANCOCIN) IVPB 1000 mg/200 mL premix  Status:  Discontinued     1,000 mg 200 mL/hr over 60 Minutes Intravenous Every 8 hours 08/05/18 1410 08/08/18 1257   08/05/18 1415  ceFEPIme (MAXIPIME) 1 g in sodium chloride 0.9 % 100 mL IVPB     1 g 200 mL/hr  over 30 Minutes Intravenous Every 8 hours 08/05/18 1410 08/12/18 0544   08/04/18 0645  levofloxacin (LEVAQUIN) IVPB 500 mg  Status:  Discontinued     500 mg 100 mL/hr over 60 Minutes Intravenous Every 24 hours 08/04/18 0642 08/05/18 1316   07/25/18 1700  fluconazole (DIFLUCAN) IVPB 400 mg  Status:  Discontinued     400 mg 100 mL/hr over 120 Minutes Intravenous Every 24 hours 07/25/18 1633 07/27/18 1415   07/12/18 2100  ceFAZolin (ANCEF) IVPB 2g/100 mL premix  Status:  Discontinued     2 g 200 mL/hr over 30 Minutes Intravenous Every 8 hours 07/12/18 1555 07/15/18 1050   07/10/18 2229  bacitracin 50,000 Units in sodium chloride 0.9 % 500 mL irrigation  Status:  Discontinued       As needed 07/10/18 2230 07/10/18 2300   07/07/18 1133  bacitracin 50,000 Units in sodium chloride 0.9 % 500 mL irrigation  Status:  Discontinued       As needed 07/07/18 1133 07/07/18 1357   07/06/18 0900  ceFAZolin (ANCEF) IVPB 2g/100 mL premix  Status:  Discontinued     2 g 200 mL/hr over 30 Minutes Intravenous Every 8 hours 07/06/18 0810 07/12/18 1555   07/01/18 2240  bacitracin 50,000 Units in sodium chloride 0.9 % 500 mL irrigation  Status:  Discontinued       As needed 07/01/18 2240 07/01/18 2324   06/30/18 1330  doxycycline (VIBRA-TABS) tablet 100 mg  Status:  Discontinued     100 mg Oral Every 12 hours 06/30/18 1329 07/12/18 1429   06/27/18 1000  fluconazole (DIFLUCAN) tablet 200 mg  Status:  Discontinued     200 mg Oral Daily 06/27/18 0725 07/04/18 0720   06/22/18 2315  ceFAZolin (ANCEF) IVPB 2g/100 mL premix     2 g 200 mL/hr over 30 Minutes Intravenous Every 8 hours 06/22/18 2314 06/27/18 1519   06/22/18 1828  bacitracin 50,000 Units in sodium chloride 0.9 % 500 mL irrigation  Status:  Discontinued       As needed 06/22/18 1828 06/22/18 1915   06/22/18 1700  ceFAZolin (ANCEF) 3 g in dextrose 5 % 50 mL IVPB  Status:  Discontinued     3 g 100 mL/hr over 30 Minutes Intravenous  Once 06/22/18 1647 06/22/18  2101   06/14/18 2100  ceFAZolin (ANCEF) IVPB 2g/100 mL premix     2 g 200 mL/hr over 30 Minutes Intravenous Every 8 hours 06/14/18 1712 06/17/18 0800   06/14/18 1414  bacitracin 50,000 Units in sodium chloride 0.9 % 500 mL irrigation  Status:  Discontinued       As needed 06/14/18 1414 06/14/18 1647   06/14/18 1215  ceFAZolin (ANCEF) 3 g in dextrose 5 % 50 mL IVPB     3 g 160 mL/hr over 30 Minutes Intravenous To [facility] 06/14/18 1202 06/14/18 2225   06/14/18 1200  ceFAZolin (ANCEF) IVPB 2g/100 mL premix  Status:  Discontinued     2 g 200 mL/hr over 30 Minutes Intravenous On call to O.R. 06/14/18 1157 06/14/18 2140       Objective   Vitals:   09/09/18 0000 09/09/18 0400 09/09/18 0700 09/09/18 1249  BP: 119/89 122/80 114/80 127/86  Pulse:    (!) 104  Resp:    18  Temp: 97.7 F (36.5 C) (!) 97.5 F (36.4 C) (!) 100.8 F (38.2 C) 98.7 F (37.1 C)  TempSrc: Oral Oral Oral Oral  SpO2:   97% 98%  Weight:      Height:        Intake/Output Summary (Last 24 hours) at 09/09/2018 1557 Last data filed at 09/09/2018 0300 Gross per 24 hour  Intake -  Output 1700 ml  Net -1700 ml   Filed Weights   09/05/18 0500 09/06/18 0500 09/07/18 0500  Weight: 122.4 kg 122.4 kg 122.6 kg     Physical Examination:    General: Appears in no acute distress  Cardiovascular: S1-S2, regular, no murmurs auscultated  Respiratory: Clear to auscultation bilaterally, no wheezing or crackles  Abdomen: Soft, nontender, no organomegaly  Extremities: No edema in the lower extremities  Neurologic: Right upper extremity 5/5 motor strength, 0/5 in left upper extremity, bilateral lower extremities.  Sensations intact in both upper extremities and left lower extremity     Data Reviewed: I have personally reviewed following labs and imaging studies   No results found for this or any previous visit (from the past 240 hour(s)).   Liver Function Tests: No results for input(s): AST, ALT,  ALKPHOS, BILITOT, PROT, ALBUMIN in the last 168 hours. No results for input(s): LIPASE, AMYLASE in the last 168 hours. No results for input(s): AMMONIA in the last 168 hours.  Cardiac Enzymes: No results for input(s): CKTOTAL, CKMB, CKMBINDEX, TROPONINI in the last 168 hours. BNP (last 3 results) No results for input(s): BNP in the last 8760 hours.  ProBNP (last 3 results) No results for input(s): PROBNP in the last 8760 hours.    Studies: No results found.  Scheduled Meds: . allopurinol  100 mg Oral Daily  . apixaban  5 mg Oral BID  . baclofen  10 mg Oral BID  . bismuth subsalicylate  30 mL Oral TID AC & HS  . clonazePAM  0.5 mg Oral BID  . colchicine  0.6 mg Oral BID  . collagenase   Topical Daily  . famotidine  20 mg Oral Daily  . feeding supplement (ENSURE ENLIVE)  237 mL Oral TID BM  . furosemide  40 mg Oral BID  . gabapentin  600 mg Oral TID  . indomethacin  25 mg Oral BID WC  . insulin aspart  0-20 Units Subcutaneous TID WC  . insulin aspart  0-5 Units Subcutaneous QHS  . insulin glargine  15 Units Subcutaneous QHS  . magic mouthwash w/lidocaine  5 mL Oral TID  . mouth rinse  15 mL Mouth Rinse BID  . multivitamin with minerals  1 tablet Oral Daily  . omeprazole  20 mg Oral BID AC  . potassium chloride  40 mEq Oral Daily  . sodium chloride flush  10-40 mL Intracatheter Q12H    Admission status: Observation/Inpatient: Based on patients clinical presentation and evaluation of above clinical data, I have made determination that patient meets Inpatient criteria at this time.  Time spent: 20 min  [facility] Pager [phone]. If 7PM-7AM, please contact night-coverage at [email or web address], Office  [phone]  password [email or web address]  09/09/2018, 3:57 PM  LOS: 94 days

## 2018-09-10 LAB — GLUCOSE, CAPILLARY
Glucose-Capillary: 85 mg/dL (ref 70–99)
Glucose-Capillary: 139 mg/dL — ABNORMAL HIGH (ref 70–99)

## 2018-09-10 NOTE — Progress Notes (Signed)
[facility]  PROGRESS NOTE  [name] [name] OYD:[id number] DOB: 11/02/1965 DOA: 06/06/2018 PCP: Helane Rima, MD   Brief HPI:   52 year old male with a history of gout, hypertension who underwent anterior cervical corpectomy of C4 on 6/72/0947 with a complicated postoperative course including development of multiple PEs and DVTs, started on anticoagulation and subsequently developed spinal hematoma requiring reexploration on 03/28/2018 status post IVC filter placement discharge to skilled nursing facility.  Patient came back on 06/06/2018 as transfer from [facility] with cervical spine MRI showing changes consistent with cord compression, cord edema and concern for ischemia.  Patient's hospital course has been complicated by cervical decompression, laminectomy, postoperative hematoma, multiple evacuations and new bilateral lower extremity DVTs, epidural hematoma (09/62, 83/66), complicating anticoagulation therapy (briefly undergone treatment and discontinued given recurrent hematomas), new PE now stable on Eliquis, bone marrow biopsy for evaluation of thrombocytopenia, platelet and Nplate transfusion.  Cervical spine MRI on 08/19/2018 which was evaluated by neurosurgery and deemed it looked stable and recommended no further work-up. Patient has been stable until had a fever of 102 on 12/15 which was followed by fever as overnight on 08/28/2018.  On both days patient was found to be somewhat altered with symptoms of disorientation.  No new neurological deficits.  Work-up included negative chest x-ray, no acute abnormality on CT abdomen no repeat or worsening DVTs on lower extremity venous duplex and no growth on blood cultures x2 obtained.  He was treated for gout with steroids with improvement in gout symptoms.  He has remained afebrile.  Patient is stable for discharge to spinal rehab facility.    Subjective   Patient seen and examined, no new complaints   Assessment/Plan:      1. Severe cervical spine stenosis-patient has stable quadriparesis, right upper extremity stronger than left.  He is status post decompressive surgery complicated by multiple postoperative hematomas and questions.  Most recent MRI of cervical spine on 12 7 report chronic sequela of compressive myelopathy.  Case was discussed earlier with neurosurgery who deemed slight improvement from prior films and surgery, no further plans for surgery given drug neurologically stable and very high risk of further bleeding in the cervical spine area per neurosurgery.  Patient will need aggressive rehabilitation with spinal rehab.  2. Acute gout flare-resolved, patient complained of bilateral lower extremity ankle and toe pain concerning for acute gout flare treated with steroids.  Continue allopurinol, colchicine, indomethacin.  3. Fever-resolved, likely from gout flare.  No leukocytosis.  4. Acute pulmonary embolism/DVT-status post IVC filter in July 2019.  Multiple events of postoperative epidural hematoma requiring to hold anticoagulation.  Subsequently DVT therefore started on bivalirudin on 07/26/2018 for submassive PE.  Still had DVTs therefore was switched to argatroban on 08/11/2018.  Currently he is back on Eliquis.  5. Bilateral upper and lower extremity edema-likely from hypoalbuminemia, no evidence of CHF.  Continue furosemide 40 mg twice a day  6. Incidental finding of pneumoperitoneum on 08/05/2018-currently resolved.  7. Thrombocytopenia-resolved, had bone marrow biopsy requiring initiation of platelet transfusion.  Concern for HIT but serotonin release assay was negative  8. Essential hypertension-blood pressure stable.  9. Severe osteoarthritis of left knee-continue pain control  10. Peripheral neuropathy-continue gabapentin  11. Pressure ulcer on right ischial tuberosity stage II-continue local wound care.     CBG: Recent Labs  Lab 09/09/18 0809 09/09/18 1208 09/09/18 1643  09/09/18 2120 09/10/18 0734  GLUCAP 51* 123* 157* 140* 85    CBC: Recent Labs  Lab 09/04/18  0124 09/07/18 0352  WBC 17.7* 15.9*  HGB 11.1* 11.2*  HCT 34.5* 35.9*  MCV 94.3 93.0  PLT 166 176    Basic Metabolic Panel: Recent Labs  Lab 09/04/18 0124  NA 142  K 4.3  CL 97*  CO2 28  GLUCOSE 144*  BUN 56*  CREATININE 0.64  CALCIUM 9.6     DVT prophylaxis: Eliquis  Code Status: Full code  Family Communication: No family at bedside  Disposition Plan: Pending discharge to spine rehab in [place] on 09/11/2018   Consultants:  Neurosurgery  Oncology  Cardiology  General surgery  Procedures:  On 10/2 1. posterior cervical decompressive laminectomy at C3, C4, C5 with removal of the spinous process and complete laminectomy and foraminotomies of the C3, C4, C5 nerve roots. 2. Posterior cervical fusion with lateral mass screws at C3-C4-C5 and pars screws at C2 lies in the globus ellipseLateral mass screw system 3. Posterior lateral arthrodesis C2-C5 utilizing locally harvested autograft mixed with vivigen  06/22/2018:(Preceded by symptoms of worsening left-sided paresthesia weakness, MRI noted postoperative hematoma) Procedure reexploration posterior cervical wound for evacuation of epidural hematoma  06/26/2018 DVT ultrasound:Right: Findings consistent with acute deep vein thrombosis involving the right common femoral vein, right femoral vein, right popliteal vein, right posterior tibial vein, and right peroneal vein. Thrombosis extends proximally into external iliac vein.  Proximal iliac vein not visualized. Left: Findings consistent with acute deep vein thrombosis involving the left common femoral vein, left femoral vein, left popliteal vein, left posterior tibial vein, and left peroneal vein. Findings consistent with acute superficial vein thrombosis  involving the left great saphenous vein. Thrombosis extends proximally into external iliac vein. Proximal  iliac vein not visualized.  07/01/18 (symptoms of decreased left hand grip, MRI showed large epidural hematoma) requiring operative clot evacuation   07/02/18 RUE doppler, no DVT in right upper extremity  10/28/2019Bilateral lower extremity Doppler, acute DVT in right, acute DVTin left common femoral vein, left proximal profunda vein, left femoral vein, left popliteal vein, left posterior tibial vein, and left peroneal vein  TTE, 07/27/2018, EF 16-07%, grade 1 diastolic dysfunction  37/06/6268 bilateral lower extremity Doppler, DVT in bilateral saphenofemoral junctions, common femoral, femoral, popliteal, posterior tibial, and peroneal veins  08/10/2018 Left upper extremity duplex, acute DVTin left subclavian veins, left axillary vein, left brachial vein and left radial veins. Acute superficial vein thrombosis involving the left basilar vein and left cephalic vein.  08/29/2018 bilateral lower extremity duplexAge indeterminate DVTin the right common femoral vein, right femoral vein, right proximal profunda vein, right popliteal vein, right posterior tibial vein, and right peroneal vein. And age-indeterminate left DVT involving the left common femoral vein, left femoral vein, left proximal profunda vein, left popliteal vein, left posterior tibial vein, and left peroneal vein    Antibiotics:   Anti-infectives (From admission, onward)   Start     Dose/Rate Route Frequency Ordered Stop   08/13/18 1000  ceFEPIme (MAXIPIME) 1 g in sodium chloride 0.9 % 100 mL IVPB     1 g 200 mL/hr over 30 Minutes Intravenous Every 8 hours 08/13/18 0949 08/14/18 2259   08/05/18 1415  vancomycin (VANCOCIN) IVPB 1000 mg/200 mL premix  Status:  Discontinued     1,000 mg 200 mL/hr over 60 Minutes Intravenous Every 8 hours 08/05/18 1410 08/08/18 1257   08/05/18 1415  ceFEPIme (MAXIPIME) 1 g in sodium chloride 0.9 % 100 mL IVPB     1 g 200 mL/hr over 30 Minutes Intravenous Every 8 hours 08/05/18  1410  08/12/18 0544   08/04/18 0645  levofloxacin (LEVAQUIN) IVPB 500 mg  Status:  Discontinued     500 mg 100 mL/hr over 60 Minutes Intravenous Every 24 hours 08/04/18 0642 08/05/18 1316   07/25/18 1700  fluconazole (DIFLUCAN) IVPB 400 mg  Status:  Discontinued     400 mg 100 mL/hr over 120 Minutes Intravenous Every 24 hours 07/25/18 1633 07/27/18 1415   07/12/18 2100  ceFAZolin (ANCEF) IVPB 2g/100 mL premix  Status:  Discontinued     2 g 200 mL/hr over 30 Minutes Intravenous Every 8 hours 07/12/18 1555 07/15/18 1050   07/10/18 2229  bacitracin 50,000 Units in sodium chloride 0.9 % 500 mL irrigation  Status:  Discontinued       As needed 07/10/18 2230 07/10/18 2300   07/07/18 1133  bacitracin 50,000 Units in sodium chloride 0.9 % 500 mL irrigation  Status:  Discontinued       As needed 07/07/18 1133 07/07/18 1357   07/06/18 0900  ceFAZolin (ANCEF) IVPB 2g/100 mL premix  Status:  Discontinued     2 g 200 mL/hr over 30 Minutes Intravenous Every 8 hours 07/06/18 0810 07/12/18 1555   07/01/18 2240  bacitracin 50,000 Units in sodium chloride 0.9 % 500 mL irrigation  Status:  Discontinued       As needed 07/01/18 2240 07/01/18 2324   06/30/18 1330  doxycycline (VIBRA-TABS) tablet 100 mg  Status:  Discontinued     100 mg Oral Every 12 hours 06/30/18 1329 07/12/18 1429   06/27/18 1000  fluconazole (DIFLUCAN) tablet 200 mg  Status:  Discontinued     200 mg Oral Daily 06/27/18 0725 07/04/18 0720   06/22/18 2315  ceFAZolin (ANCEF) IVPB 2g/100 mL premix     2 g 200 mL/hr over 30 Minutes Intravenous Every 8 hours 06/22/18 2314 06/27/18 1519   06/22/18 1828  bacitracin 50,000 Units in sodium chloride 0.9 % 500 mL irrigation  Status:  Discontinued       As needed 06/22/18 1828 06/22/18 1915   06/22/18 1700  ceFAZolin (ANCEF) 3 g in dextrose 5 % 50 mL IVPB  Status:  Discontinued     3 g 100 mL/hr over 30 Minutes Intravenous  Once 06/22/18 1647 06/22/18 2101   06/14/18 2100  ceFAZolin (ANCEF) IVPB 2g/100 mL  premix     2 g 200 mL/hr over 30 Minutes Intravenous Every 8 hours 06/14/18 1712 06/17/18 0800   06/14/18 1414  bacitracin 50,000 Units in sodium chloride 0.9 % 500 mL irrigation  Status:  Discontinued       As needed 06/14/18 1414 06/14/18 1647   06/14/18 1215  ceFAZolin (ANCEF) 3 g in dextrose 5 % 50 mL IVPB     3 g 160 mL/hr over 30 Minutes Intravenous To [facility] 06/14/18 1202 06/14/18 2225   06/14/18 1200  ceFAZolin (ANCEF) IVPB 2g/100 mL premix  Status:  Discontinued     2 g 200 mL/hr over 30 Minutes Intravenous On call to O.R. 06/14/18 1157 06/14/18 2140       Objective   Vitals:   09/10/18 0300 09/10/18 0700 09/10/18 0743 09/10/18 1200  BP: 125/88 97/85 105/78 119/86  Pulse:      Resp:      Temp: 98.5 F (36.9 C) 98 F (36.7 C)  98.2 F (36.8 C)  TempSrc: Oral Oral  Oral  SpO2:  100%  100%  Weight:      Height:  Intake/Output Summary (Last 24 hours) at 09/10/2018 1427 Last data filed at 09/10/2018 0913 Gross per 24 hour  Intake 360 ml  Output 1401 ml  Net -1041 ml   Filed Weights   09/05/18 0500 09/06/18 0500 09/07/18 0500  Weight: 122.4 kg 122.4 kg 122.6 kg     Physical Examination:      General: Appears in no acute distress  Cardiovascular: S1-S2, regular, no murmur auscultated  Respiratory: Clear to auscultation bilaterally  Abdomen: Soft, nontender, no organomegaly  Musculoskeletal: No edema in the lower extremities  Neurological-motor strength 5/5 in right upper extremity, 0/5 in left upper and both lower extremities.  Sensations intact bilaterally.     Data Reviewed: I have personally reviewed following labs and imaging studies   No results found for this or any previous visit (from the past 240 hour(s)).   Liver Function Tests: No results for input(s): AST, ALT, ALKPHOS, BILITOT, PROT, ALBUMIN in the last 168 hours. No results for input(s): LIPASE, AMYLASE in the last 168 hours. No results for input(s): AMMONIA  in the last 168 hours.  Cardiac Enzymes: No results for input(s): CKTOTAL, CKMB, CKMBINDEX, TROPONINI in the last 168 hours. BNP (last 3 results) No results for input(s): BNP in the last 8760 hours.  ProBNP (last 3 results) No results for input(s): PROBNP in the last 8760 hours.    Studies: No results found.  Scheduled Meds: . allopurinol  100 mg Oral Daily  . apixaban  5 mg Oral BID  . baclofen  10 mg Oral BID  . bismuth subsalicylate  30 mL Oral TID AC & HS  . clonazePAM  0.5 mg Oral BID  . colchicine  0.6 mg Oral BID  . collagenase   Topical Daily  . famotidine  20 mg Oral Daily  . feeding supplement (ENSURE ENLIVE)  237 mL Oral TID BM  . furosemide  40 mg Oral BID  . gabapentin  600 mg Oral TID  . indomethacin  25 mg Oral BID WC  . insulin aspart  0-20 Units Subcutaneous TID WC  . insulin aspart  0-5 Units Subcutaneous QHS  . insulin glargine  15 Units Subcutaneous QHS  . magic mouthwash w/lidocaine  5 mL Oral TID  . mouth rinse  15 mL Mouth Rinse BID  . multivitamin with minerals  1 tablet Oral Daily  . omeprazole  20 mg Oral BID AC  . potassium chloride  40 mEq Oral Daily  . sodium chloride flush  10-40 mL Intracatheter Q12H    Admission status: Observation/Inpatient: Based on patients clinical presentation and evaluation of above clinical data, I have made determination that patient meets Inpatient criteria at this time.  Time spent: 20 min  [facility] Pager [phone]. If 7PM-7AM, please contact night-coverage at [email or web address], Office  [phone]  password [email or web address]  09/10/2018, 2:27 PM  LOS: 95 days

## 2018-09-11 LAB — GLUCOSE, CAPILLARY
Glucose-Capillary: 114 mg/dL — ABNORMAL HIGH (ref 70–99)
Glucose-Capillary: 105 mg/dL — ABNORMAL HIGH (ref 70–99)

## 2018-09-11 NOTE — Social Work (Signed)
Clinical Social Worker facilitated patient discharge including contacting patient family and facility to confirm patient discharge plans.  Clinical information faxed to facility and family agreeable with plan.  CSW arranged ambulance transport [facility] RN to call [phone], ext. [phone] or [phone] with report.  Clinical Social Worker will sign off for now as social work intervention is no longer needed. Please consult us again if new need arises.  [name] GravesIsabel Camary [name], MSW, [facility] LPCSWA Clinical Social Worker [phone]

## 2018-09-11 NOTE — Clinical Social Work Placement (Signed)
   CLINICAL SOCIAL WORK PLACEMENT  NOTE  Date:  09/11/2018  Patient Details  Name: Azucena FreedCornell Sedore MRN: [id number] Date of Birth: April 30, 1966  Clinical Social Work is seeking post-discharge placement for this patient at the Skilled  Nursing Facility level of care (*CSW will initial, date and re-position this form in  chart as items are completed):  Yes   Patient/family provided with [facility] Work Department's list of facilities offering this level of care within the geographic area requested by the patient (or if unable, by the patient's family).  Yes   Patient/family informed of their freedom to choose among providers that offer the needed level of care, that participate in Medicare, Medicaid or managed care program needed by the patient, have an available bed and are willing to accept the patient.  Yes   Patient/family informed of [facility] ownership interest [facility] and [facility], as well as of the fact that they are under no obligation to receive care at these facilities.  PASRR submitted to EDS on       PASRR number received on 08/08/18     Existing PASRR number confirmed on       FL2 transmitted to all facilities in geographic area requested by pt/family on 08/28/18     FL2 transmitted to all facilities within larger geographic area on 08/31/18     Patient informed that his/her managed care company has contracts with or will negotiate with certain facilities, including the following:        Yes   Patient/family informed of bed offers received.  Patient chooses bed at Other - please specify in the comment section below[facility])     Physician recommends and patient chooses bed at      Patient to be transferred to Other - please specify in the comment section below[facility] on  .  [facility]     Patient family notified on   of transfer.  Name of  family member notified:  pt father Richard     PHYSICIAN       Additional Comment:    _______________________________________________ Doy HutchingIsabel H Danice Dippolito, LCSWA 09/11/2018, 8:25 AM

## 2019-07-06 IMAGING — CT CT ANGIO CHEST
2 of 8 series · 17 of 46 positions shown · IV contrast (iopamidol)
Comparison: Chest radiograph July 25, 2018 and CT chest
July 15, 2018

Addendum:
CLINICAL DATA: Follow up pulmonary embolism.

EXAM:
CT ANGIOGRAPHY CHEST WITH CONTRAST
TECHNIQUE: Multidetector CT imaging of the chest was performed using the
standard protocol during bolus administration of intravenous
contrast. Multiplanar CT image reconstructions and MIPs were
obtained to evaluate the vascular anatomy.
CONTRAST:  100mL [id number] IOPAMIDOL ([id number]) INJECTION 76%

[Series 6: thins · axial · 0.82mm/px · z∈[+1179,+1447]mm · 14 of 296 slices shown]
[im 14/296  lung]
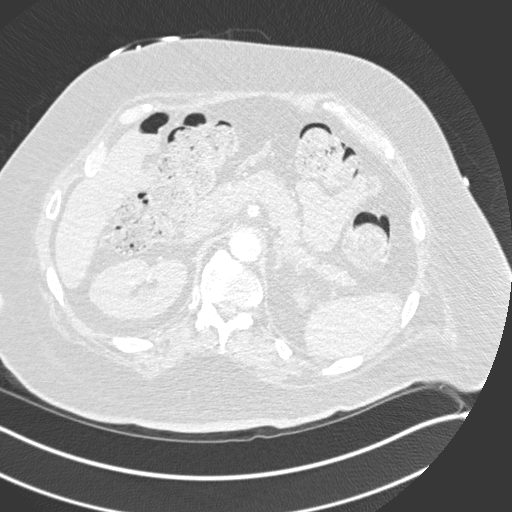
[im 41/296  soft-tissue]
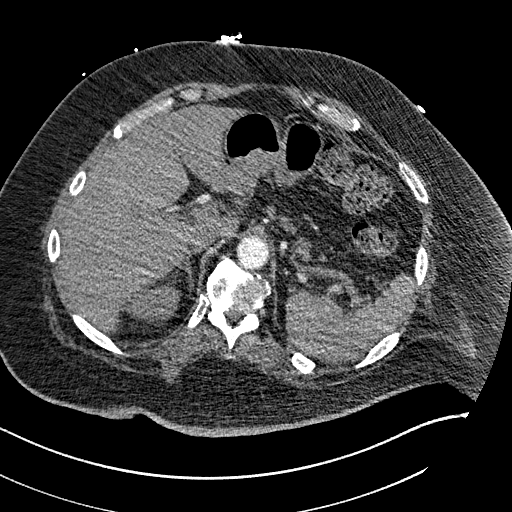
[im 54/296  lung]
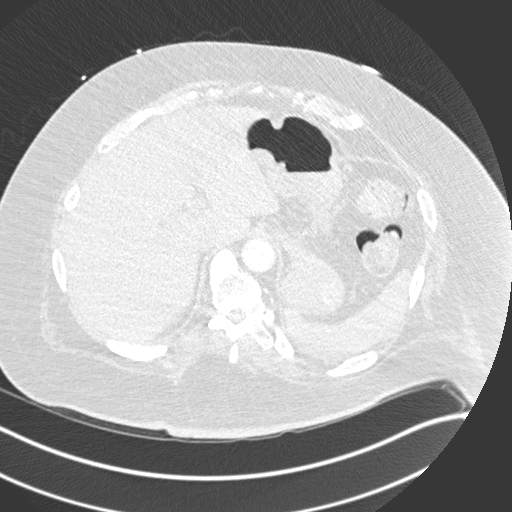
[im 81/296  soft-tissue]
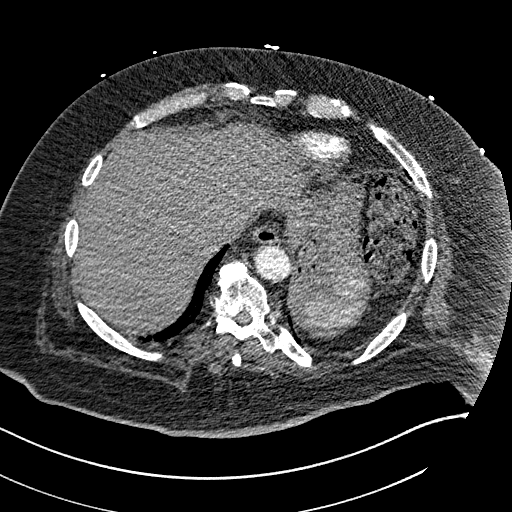
[im 94/296  lung]
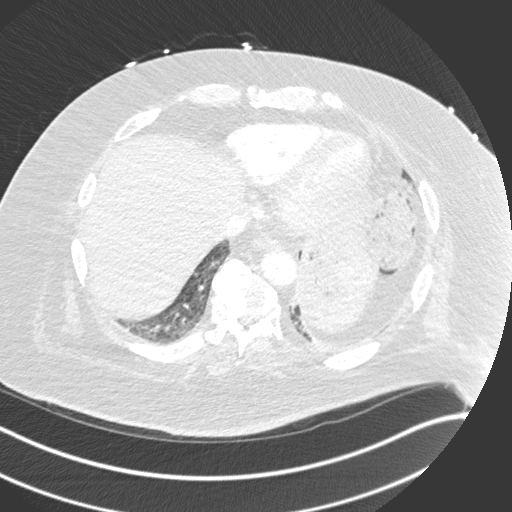
[im 121/296  soft-tissue]
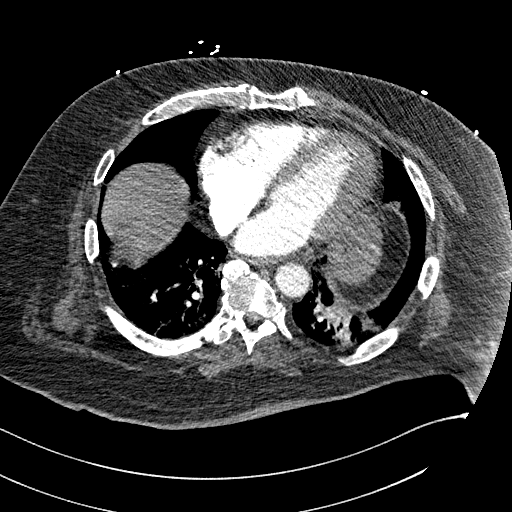
[im 135/296  lung]
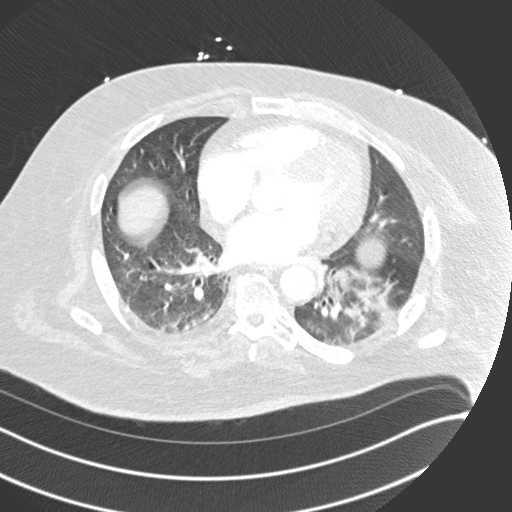
[im 161/296  soft-tissue]
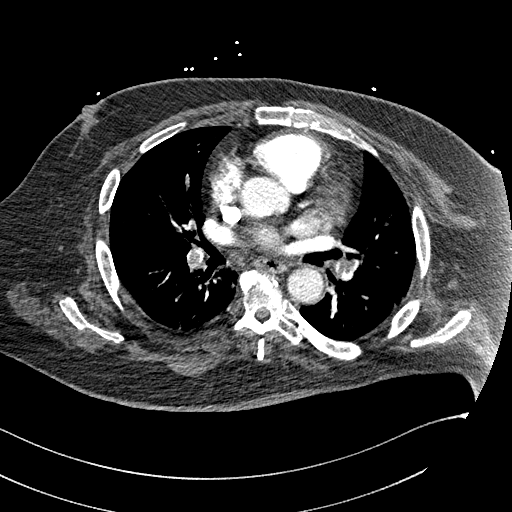
[im 175/296  lung]
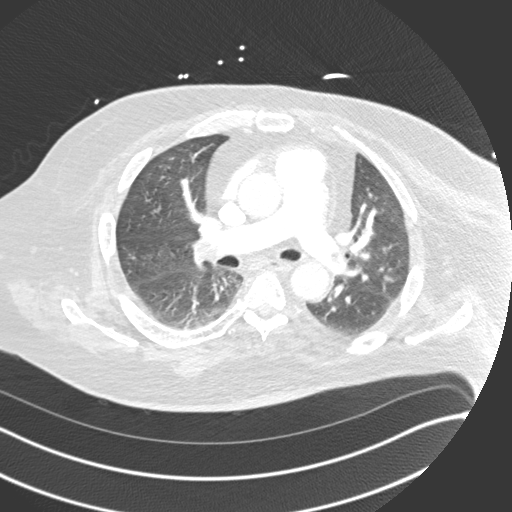
[im 202/296  soft-tissue]
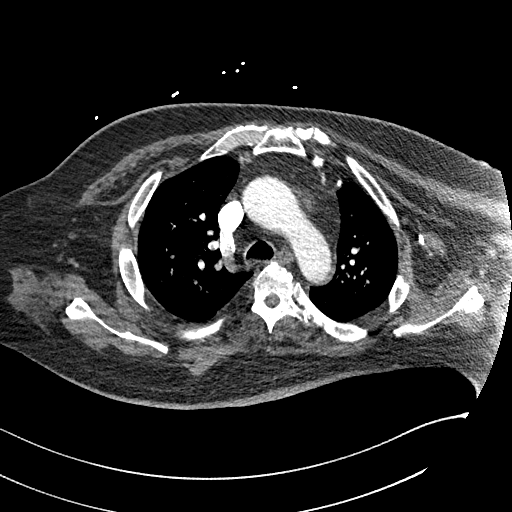
[im 215/296  lung]
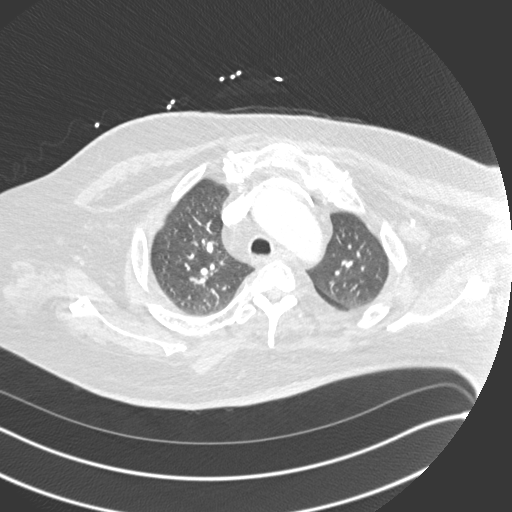
[im 242/296  soft-tissue]
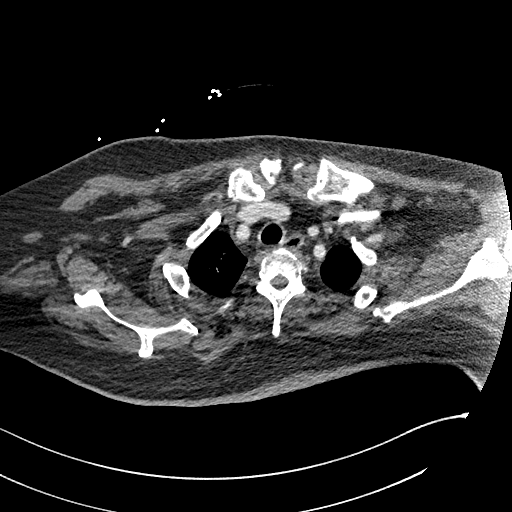
[im 255/296  lung]
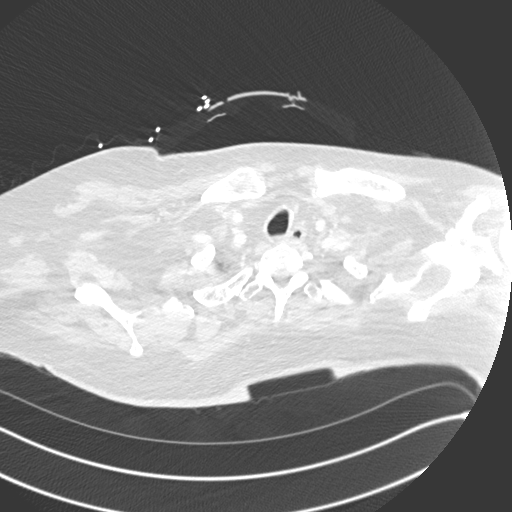
[im 282/296  soft-tissue]
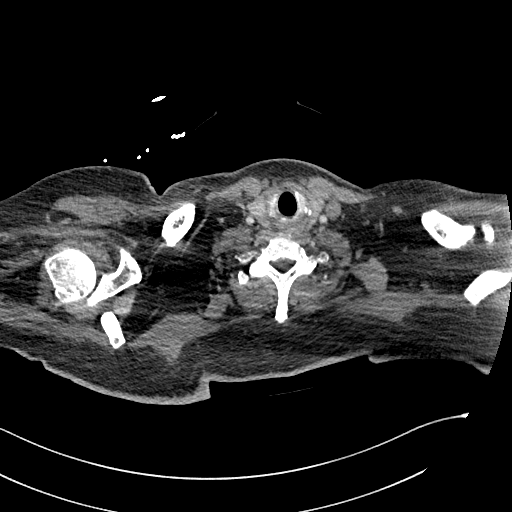

[Series 8: coronal mpr · coronal · 0.68mm/px · 3 of 163 slices shown]
[im 41/163  soft-tissue]
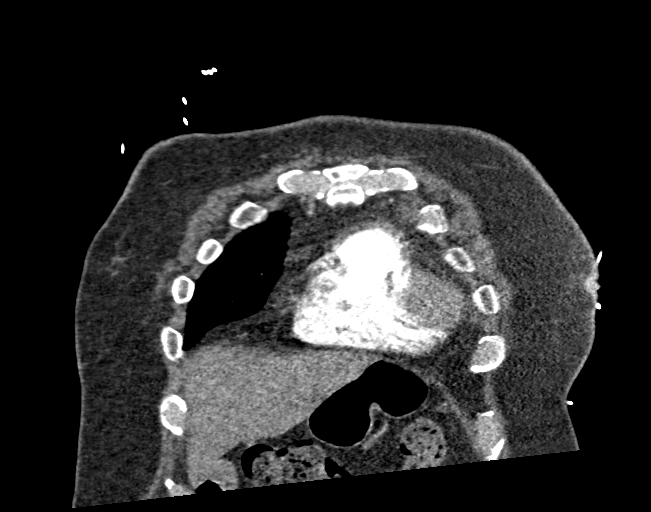
[im 82/163  soft-tissue]
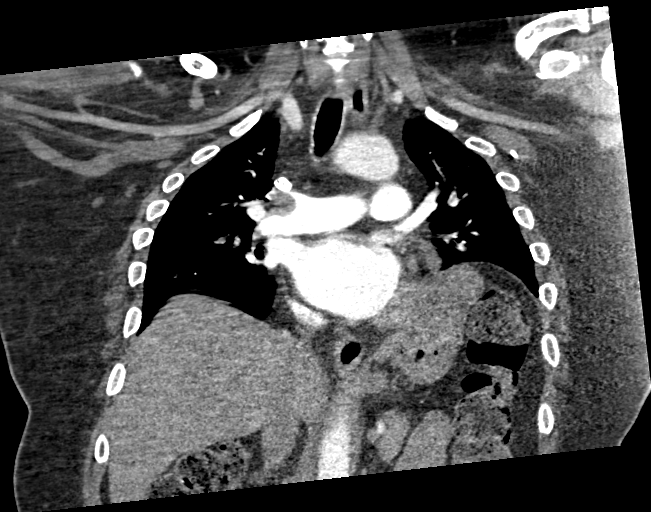
[im 122/163  soft-tissue]
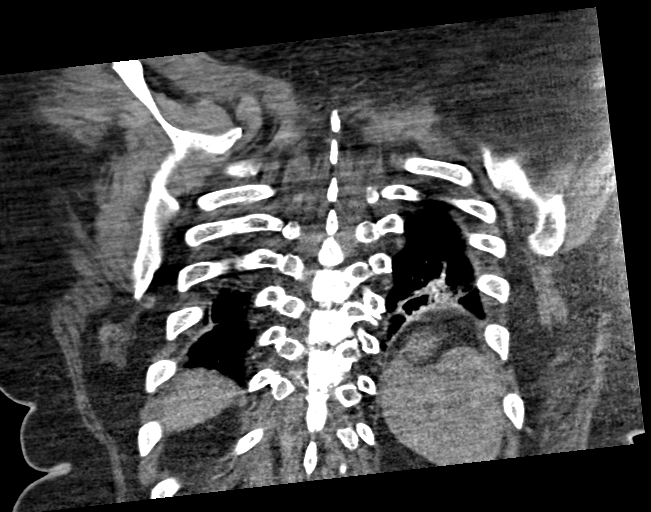

[17 of 46 positions shown; findings below may reference images not displayed]

FINDINGS: CARDIOVASCULAR: Adequate contrast opacification of the pulmonary
artery's. Main pulmonary artery is not enlarged. Central bilateral
main pulmonary artery nonocclusive pulmonary emboli casting into
lobar, segmental and subsegmental branches, some of which are
occlusive. RIGHT heart stain (RV/LV 1.4). Heart size is upper limits
of normal. No pericardial effusion.

MEDIASTINUM/NODES: No lymphadenopathy by CT size criteria. LEFT PICC
distal tip and proximal superior vena cava.

LUNGS/PLEURA: Tracheobronchial tree is patent, no pneumothorax. Low
inspiratory examination with elevated LEFT hemidiaphragm. Patchy
LEFT lower lobe consolidation. Bibasilar bandlike atelectasis. No
pleural effusion. 2 mm lingular subpleural pulmonary nodules
unchanged.

UPPER ABDOMEN: Non-acute. Moderate amount of retained large bowel
stool.

MUSCULOSKELETAL: Non-acute. Mild degenerative change of the thoracic
spine superimposed on congenital canal narrowing.

Review of the MIP images confirms the above findings.
IMPRESSION: 1. Positive for acute PE with CT evidence of right heart strain
(RV/LV Ratio = 1.4) consistent with at least submassive
(intermediate risk) PE. The presence of right heart strain has been
associated with an increased risk of morbidity and mortality.
2. LEFT lower lobe infarct versus pneumonia.  Bibasilar atelectasis.
3. Mild cardiomegaly.

ADDENDUM:
Acute findings discussed with and reconfirmed by Dr.Elekes, GI on

ADDENDUM:
Critical Value/emergent results were called by telephone at the time
of interpretation on 07/27/2018 at [DATE] to Dr. Febus,
hospitalist , who verbally acknowledged these results.

ADDENDUM:
The referring provider has asked that this exam be compared to the
study from 03/27/2018. I have reviewed only the pulmonary arteries
on the current study. The exam from 03/27/2018 demonstrated acute
pulmonary embolus that had resolved by the study of 07/15/2018. The
pulmonary embolic disease on the current study (07/26/2018) is
therefore new since 07/15/2018 and is present in pulmonary arteries
that were free of embolus on the 03/27/2018 exam (compare right main
and interlobar pulmonary arteries on the 07/26/2018 and 03/27/2018
exams), again confirming this is new embolic disease since
07/15/2018..

*** End of Addendum ***
